# Patient Record
Sex: Male | Born: 1960 | Race: White | Hispanic: No | Marital: Married | Smoking: Former smoker
Health system: Southern US, Community
[De-identification: ages and names within clinical notes are randomized; demographics above are authoritative.]

## PROBLEM LIST (undated history)

## (undated) DIAGNOSIS — S59909A Unspecified injury of unspecified elbow, initial encounter: Secondary | ICD-10-CM

## (undated) DIAGNOSIS — F32A Depression, unspecified: Secondary | ICD-10-CM

## (undated) DIAGNOSIS — K760 Fatty (change of) liver, not elsewhere classified: Secondary | ICD-10-CM

## (undated) DIAGNOSIS — K219 Gastro-esophageal reflux disease without esophagitis: Secondary | ICD-10-CM

## (undated) DIAGNOSIS — I10 Essential (primary) hypertension: Secondary | ICD-10-CM

## (undated) DIAGNOSIS — I2510 Atherosclerotic heart disease of native coronary artery without angina pectoris: Secondary | ICD-10-CM

## (undated) DIAGNOSIS — J449 Chronic obstructive pulmonary disease, unspecified: Secondary | ICD-10-CM

## (undated) DIAGNOSIS — M92521 Juvenile osteochondrosis of tibia tubercle, right leg: Secondary | ICD-10-CM

## (undated) DIAGNOSIS — N6009 Solitary cyst of unspecified breast: Secondary | ICD-10-CM

## (undated) DIAGNOSIS — F329 Major depressive disorder, single episode, unspecified: Secondary | ICD-10-CM

## (undated) DIAGNOSIS — K7460 Unspecified cirrhosis of liver: Secondary | ICD-10-CM

## (undated) DIAGNOSIS — I209 Angina pectoris, unspecified: Secondary | ICD-10-CM

## (undated) DIAGNOSIS — E669 Obesity, unspecified: Secondary | ICD-10-CM

## (undated) DIAGNOSIS — E782 Mixed hyperlipidemia: Secondary | ICD-10-CM

## (undated) DIAGNOSIS — M9251 Juvenile osteochondrosis of tibia and fibula, right leg: Secondary | ICD-10-CM

## (undated) DIAGNOSIS — F419 Anxiety disorder, unspecified: Secondary | ICD-10-CM

## (undated) DIAGNOSIS — R0600 Dyspnea, unspecified: Secondary | ICD-10-CM

## (undated) DIAGNOSIS — E1140 Type 2 diabetes mellitus with diabetic neuropathy, unspecified: Secondary | ICD-10-CM

## (undated) DIAGNOSIS — M7711 Lateral epicondylitis, right elbow: Secondary | ICD-10-CM

## (undated) DIAGNOSIS — E119 Type 2 diabetes mellitus without complications: Secondary | ICD-10-CM

## (undated) HISTORY — DX: Fatty (change of) liver, not elsewhere classified: K76.0

## (undated) HISTORY — PX: TONSILLECTOMY: SUR1361

## (undated) HISTORY — DX: Type 2 diabetes mellitus with diabetic neuropathy, unspecified: E11.40

## (undated) HISTORY — DX: Atherosclerotic heart disease of native coronary artery without angina pectoris: I25.10

## (undated) HISTORY — DX: Mixed hyperlipidemia: E78.2

## (undated) HISTORY — DX: Type 2 diabetes mellitus without complications: E11.9

## (undated) HISTORY — PX: OTHER SURGICAL HISTORY: SHX169

## (undated) HISTORY — DX: Essential (primary) hypertension: I10

---
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
## 1997-09-17 HISTORY — PX: KNEE ARTHROPLASTY: SHX992

## 2001-05-15 ENCOUNTER — Ambulatory Visit (HOSPITAL_COMMUNITY): Admission: RE | Admit: 2001-05-15 | Discharge: 2001-05-15 | Payer: Self-pay | Admitting: Internal Medicine

## 2001-05-15 ENCOUNTER — Encounter: Payer: Self-pay | Admitting: Internal Medicine

## 2001-06-12 ENCOUNTER — Ambulatory Visit (HOSPITAL_COMMUNITY): Admission: RE | Admit: 2001-06-12 | Discharge: 2001-06-12 | Payer: Self-pay | Admitting: Internal Medicine

## 2001-06-12 ENCOUNTER — Encounter (INDEPENDENT_AMBULATORY_CARE_PROVIDER_SITE_OTHER): Payer: Self-pay | Admitting: Internal Medicine

## 2001-07-24 ENCOUNTER — Encounter: Admission: RE | Admit: 2001-07-24 | Discharge: 2001-10-22 | Payer: Self-pay | Admitting: Internal Medicine

## 2001-07-25 ENCOUNTER — Ambulatory Visit (HOSPITAL_COMMUNITY): Admission: RE | Admit: 2001-07-25 | Discharge: 2001-07-25 | Payer: Self-pay | Admitting: Internal Medicine

## 2001-07-25 ENCOUNTER — Encounter: Payer: Self-pay | Admitting: Internal Medicine

## 2001-09-17 HISTORY — PX: CHOLECYSTECTOMY: SHX55

## 2002-01-02 ENCOUNTER — Encounter: Payer: Self-pay | Admitting: Internal Medicine

## 2002-01-02 ENCOUNTER — Ambulatory Visit (HOSPITAL_COMMUNITY): Admission: RE | Admit: 2002-01-02 | Discharge: 2002-01-02 | Payer: Self-pay | Admitting: Internal Medicine

## 2002-02-02 ENCOUNTER — Encounter (INDEPENDENT_AMBULATORY_CARE_PROVIDER_SITE_OTHER): Payer: Self-pay | Admitting: Internal Medicine

## 2002-02-02 ENCOUNTER — Ambulatory Visit (HOSPITAL_COMMUNITY): Admission: RE | Admit: 2002-02-02 | Discharge: 2002-02-02 | Payer: Self-pay | Admitting: Internal Medicine

## 2002-02-06 ENCOUNTER — Ambulatory Visit (HOSPITAL_COMMUNITY): Admission: RE | Admit: 2002-02-06 | Discharge: 2002-02-06 | Payer: Self-pay | Admitting: Internal Medicine

## 2002-02-06 ENCOUNTER — Encounter (INDEPENDENT_AMBULATORY_CARE_PROVIDER_SITE_OTHER): Payer: Self-pay | Admitting: Internal Medicine

## 2003-09-29 ENCOUNTER — Ambulatory Visit (HOSPITAL_COMMUNITY): Admission: RE | Admit: 2003-09-29 | Discharge: 2003-09-29 | Payer: Self-pay | Admitting: Family Medicine

## 2003-11-09 ENCOUNTER — Ambulatory Visit (HOSPITAL_COMMUNITY): Admission: RE | Admit: 2003-11-09 | Discharge: 2003-11-09 | Payer: Self-pay | Admitting: Urology

## 2004-07-20 ENCOUNTER — Ambulatory Visit (HOSPITAL_COMMUNITY): Admission: RE | Admit: 2004-07-20 | Discharge: 2004-07-20 | Payer: Self-pay | Admitting: Family Medicine

## 2004-09-17 DIAGNOSIS — E782 Mixed hyperlipidemia: Secondary | ICD-10-CM

## 2004-09-17 HISTORY — DX: Mixed hyperlipidemia: E78.2

## 2004-10-19 ENCOUNTER — Ambulatory Visit: Payer: Self-pay | Admitting: Internal Medicine

## 2005-03-18 ENCOUNTER — Emergency Department (HOSPITAL_COMMUNITY): Admission: EM | Admit: 2005-03-18 | Discharge: 2005-03-18 | Payer: Self-pay | Admitting: *Deleted

## 2005-07-10 ENCOUNTER — Ambulatory Visit: Payer: Self-pay | Admitting: Internal Medicine

## 2005-12-03 ENCOUNTER — Ambulatory Visit (HOSPITAL_COMMUNITY): Admission: RE | Admit: 2005-12-03 | Discharge: 2005-12-03 | Payer: Self-pay | Admitting: Family Medicine

## 2006-02-09 ENCOUNTER — Emergency Department (HOSPITAL_COMMUNITY): Admission: EM | Admit: 2006-02-09 | Discharge: 2006-02-09 | Payer: Self-pay | Admitting: Emergency Medicine

## 2006-09-25 ENCOUNTER — Ambulatory Visit: Payer: Self-pay | Admitting: Internal Medicine

## 2009-09-17 HISTORY — PX: CARDIAC CATHETERIZATION: SHX172

## 2009-11-21 ENCOUNTER — Encounter (INDEPENDENT_AMBULATORY_CARE_PROVIDER_SITE_OTHER): Payer: Self-pay | Admitting: *Deleted

## 2009-11-21 LAB — CONVERTED CEMR LAB
ALT: 56 units/L
AST: 39 units/L
Albumin: 4.5 g/dL
Alkaline Phosphatase: 37 units/L
BUN: 14 mg/dL
Bilirubin, Direct: 0.1 mg/dL
CO2: 25 meq/L
Calcium: 9.7 mg/dL
Chloride: 101 meq/L
Cholesterol: 193 mg/dL
Creatinine, Ser: 0.98 mg/dL
Glucose, Bld: 186 mg/dL
HDL: 31 mg/dL
LDL Cholesterol: 86 mg/dL
Potassium: 4.1 meq/L
Sodium: 140 meq/L
Total Protein: 6.8 g/dL
Triglycerides: 380 mg/dL

## 2009-11-22 ENCOUNTER — Ambulatory Visit (HOSPITAL_COMMUNITY): Admission: RE | Admit: 2009-11-22 | Discharge: 2009-11-22 | Payer: Self-pay | Admitting: Family Medicine

## 2009-11-24 ENCOUNTER — Encounter (INDEPENDENT_AMBULATORY_CARE_PROVIDER_SITE_OTHER): Payer: Self-pay | Admitting: *Deleted

## 2009-11-24 DIAGNOSIS — Z9189 Other specified personal risk factors, not elsewhere classified: Secondary | ICD-10-CM | POA: Insufficient documentation

## 2009-11-24 DIAGNOSIS — E119 Type 2 diabetes mellitus without complications: Secondary | ICD-10-CM | POA: Insufficient documentation

## 2009-11-25 ENCOUNTER — Encounter (INDEPENDENT_AMBULATORY_CARE_PROVIDER_SITE_OTHER): Payer: Self-pay | Admitting: *Deleted

## 2009-11-25 ENCOUNTER — Ambulatory Visit (HOSPITAL_COMMUNITY): Admission: RE | Admit: 2009-11-25 | Discharge: 2009-11-25 | Payer: Self-pay | Admitting: Cardiology

## 2009-11-25 ENCOUNTER — Ambulatory Visit: Payer: Self-pay | Admitting: Cardiology

## 2009-11-25 ENCOUNTER — Encounter: Payer: Self-pay | Admitting: Adult Health

## 2009-11-25 DIAGNOSIS — R9439 Abnormal result of other cardiovascular function study: Secondary | ICD-10-CM | POA: Insufficient documentation

## 2009-11-25 DIAGNOSIS — I200 Unstable angina: Secondary | ICD-10-CM | POA: Insufficient documentation

## 2009-11-25 DIAGNOSIS — E785 Hyperlipidemia, unspecified: Secondary | ICD-10-CM | POA: Insufficient documentation

## 2009-11-29 ENCOUNTER — Encounter: Payer: Self-pay | Admitting: Cardiology

## 2009-11-29 ENCOUNTER — Encounter (INDEPENDENT_AMBULATORY_CARE_PROVIDER_SITE_OTHER): Payer: Self-pay

## 2009-11-29 ENCOUNTER — Ambulatory Visit: Payer: Self-pay | Admitting: Cardiology

## 2009-11-29 ENCOUNTER — Inpatient Hospital Stay (HOSPITAL_BASED_OUTPATIENT_CLINIC_OR_DEPARTMENT_OTHER): Admission: RE | Admit: 2009-11-29 | Discharge: 2009-11-29 | Payer: Self-pay | Admitting: Cardiology

## 2009-12-09 ENCOUNTER — Telehealth (INDEPENDENT_AMBULATORY_CARE_PROVIDER_SITE_OTHER): Payer: Self-pay

## 2009-12-23 ENCOUNTER — Ambulatory Visit (HOSPITAL_COMMUNITY): Admission: RE | Admit: 2009-12-23 | Discharge: 2009-12-23 | Payer: Self-pay | Admitting: Cardiology

## 2009-12-23 ENCOUNTER — Ambulatory Visit: Payer: Self-pay | Admitting: Cardiovascular Disease

## 2009-12-23 ENCOUNTER — Encounter: Payer: Self-pay | Admitting: Cardiology

## 2009-12-26 ENCOUNTER — Encounter (INDEPENDENT_AMBULATORY_CARE_PROVIDER_SITE_OTHER): Payer: Self-pay | Admitting: *Deleted

## 2009-12-28 ENCOUNTER — Encounter: Payer: Self-pay | Admitting: Adult Health

## 2009-12-28 ENCOUNTER — Ambulatory Visit: Payer: Self-pay | Admitting: Cardiology

## 2009-12-28 DIAGNOSIS — Z9861 Coronary angioplasty status: Secondary | ICD-10-CM

## 2009-12-28 DIAGNOSIS — I10 Essential (primary) hypertension: Secondary | ICD-10-CM | POA: Insufficient documentation

## 2009-12-28 DIAGNOSIS — I2510 Atherosclerotic heart disease of native coronary artery without angina pectoris: Secondary | ICD-10-CM | POA: Insufficient documentation

## 2010-01-16 ENCOUNTER — Ambulatory Visit (HOSPITAL_COMMUNITY)
Admission: RE | Admit: 2010-01-16 | Discharge: 2010-01-16 | Payer: Self-pay | Source: Home / Self Care | Admitting: Podiatry

## 2010-05-10 ENCOUNTER — Encounter: Payer: Self-pay | Admitting: Adult Health

## 2010-09-13 ENCOUNTER — Ambulatory Visit (HOSPITAL_COMMUNITY)
Admission: RE | Admit: 2010-09-13 | Discharge: 2010-09-13 | Payer: Self-pay | Source: Home / Self Care | Attending: Family Medicine | Admitting: Family Medicine

## 2010-10-15 LAB — CONVERTED CEMR LAB
BUN: 12 mg/dL (ref 6–23)
Basophils Absolute: 0 10*3/uL (ref 0.0–0.1)
Basophils Relative: 1 % (ref 0–1)
CO2: 20 meq/L (ref 19–32)
Calcium: 10.5 mg/dL (ref 8.4–10.5)
Chloride: 101 meq/L (ref 96–112)
Creatinine, Ser: 0.93 mg/dL (ref 0.40–1.50)
Eosinophils Absolute: 0.2 10*3/uL (ref 0.0–0.7)
Eosinophils Relative: 2 % (ref 0–5)
Glucose, Bld: 131 mg/dL — ABNORMAL HIGH (ref 70–99)
HCT: 45.5 % (ref 39.0–52.0)
Hemoglobin: 16 g/dL (ref 13.0–17.0)
INR: 1.14 (ref <1.50)
Lymphocytes Relative: 35 % (ref 12–46)
Lymphs Abs: 2.7 10*3/uL (ref 0.7–4.0)
MCHC: 35.2 g/dL (ref 30.0–36.0)
MCV: 86 fL (ref 78.0–100.0)
Monocytes Absolute: 0.6 10*3/uL (ref 0.1–1.0)
Monocytes Relative: 8 % (ref 3–12)
Neutro Abs: 4.2 10*3/uL (ref 1.7–7.7)
Neutrophils Relative %: 55 % (ref 43–77)
Platelets: 174 10*3/uL (ref 150–400)
Potassium: 4.1 meq/L (ref 3.5–5.3)
Prothrombin Time: 14.5 s (ref 11.6–15.2)
RBC: 5.29 M/uL (ref 4.22–5.81)
RDW: 13.4 % (ref 11.5–15.5)
Sodium: 138 meq/L (ref 135–145)
WBC: 7.7 10*3/uL (ref 4.0–10.5)
aPTT: 27 s (ref 24–37)

## 2010-10-17 NOTE — Letter (Signed)
Summary: Stress Echocardiogram Information Sheet  [facility] at [place]. [address] [place] [address]   Phone: [phone]  Fax: [phone]      November 29, 2009 MRN: [id number] light prior to the test.   Jared Griffin  Doctor: Appointment Date: Appointment Time: Appointment Location: [facility]  Stress Echocardiogram Information Sheet    Instructions:   1. DO NOT  take your Glucophage (Metformin) the night before or the morning of Stress Echocardiogram.  2. Do Not eat or drink after midnight the night before Stress Echocardiogram.  3. Dress prepared to exercise.  4. DO NOT use ANY caffine or tobacco products 3 hours before appointment.  5. Report to the [facility] on the1st floor.  6. Please bring all current prescription medications.  7. If you have any questions, please call [phone]

## 2010-10-17 NOTE — Miscellaneous (Signed)
Summary: labs bmp,lipids,liver,11/21/2009  Clinical Lists Changes  Observations: Added new observation of CALCIUM: 9.7 mg/dL (11/21/2009 15:27) Added new observation of ALBUMIN: 4.5 g/dL (11/21/2009 15:27) Added new observation of PROTEIN, TOT: 6.8 g/dL (11/21/2009 15:27) Added new observation of SGPT (ALT): 56 units/L (11/21/2009 15:27) Added new observation of SGOT (AST): 39 units/L (11/21/2009 15:27) Added new observation of ALK PHOS: 37 units/L (11/21/2009 15:27) Added new observation of BILI DIRECT: 0.1 mg/dL (11/21/2009 15:27) Added new observation of CREATININE: 0.98 mg/dL (11/21/2009 15:27) Added new observation of BUN: 14 mg/dL (11/21/2009 15:27) Added new observation of BG RANDOM: 186 mg/dL (11/21/2009 15:27) Added new observation of CO2 PLSM/SER: 25 meq/L (11/21/2009 15:27) Added new observation of CL SERUM: 101 meq/L (11/21/2009 15:27) Added new observation of K SERUM: 4.1 meq/L (11/21/2009 15:27) Added new observation of NA: 140 meq/L (11/21/2009 15:27) Added new observation of LDL: 86 mg/dL (11/21/2009 15:27) Added new observation of HDL: 31 mg/dL (11/21/2009 15:27) Added new observation of TRIGLYC TOT: 380 mg/dL (11/21/2009 15:27) Added new observation of CHOLESTEROL: 193 mg/dL (11/21/2009 15:27)

## 2010-10-17 NOTE — Miscellaneous (Signed)
**Note De-Identified  Obfuscation** Summary: ORDER: STRESS ECHO  Clinical Lists Changes  Orders: Added new Referral order of Stress Echo (Stress Echo) - Signed

## 2010-10-17 NOTE — Letter (Signed)
Summary: ekg  ekg   Imported By: Nevada Crane 11/25/2009 16:38:34  _____________________________________________________________________  External Attachment:    Type:   Image     Comment:   External Document

## 2010-10-17 NOTE — Progress Notes (Signed)
Summary: high BP  Phone Note Call from Patient Call back at Home Phone [phone]   Caller: pt Reason for Call: Talk to Nurse Summary of Call: per pt walk in his Bp has been running 133/100 yesterday and 144/97 today. Initial call taken by: Nevada Crane,  December 09, 2009 9:48 AM  Follow-up for Phone Call        [facility]. Follow-up by: Jeani Hawking Via LPN,  December 10, 6071 11:10 AM  Additional Follow-up for Phone Call Additional follow up Details #1::        the pt does not take anything for his bp,, he has ocas. cp, no edema or palp  Additional Follow-up by: Tye Savoy RN,  December 09, 2009 11:41 AM    Additional Follow-up for Phone Call Additional follow up Details #2::    Add HCTZ 12.71m daily.  Additional Follow-up for Phone Call Additional follow up Details #3:: Details for Additional Follow-up Action Taken: Pt. advised and states he understands info. given. RX sent to [facility]  Additional Follow-up by: LJeani HawkingVia LPN,  March 25, 271061:26 PM  New/Updated Medications: HYDROCHLOROTHIAZIDE 12.5 MG TABS (HYDROCHLOROTHIAZIDE) Take one tablet by mouth daily. Prescriptions: HYDROCHLOROTHIAZIDE 12.5 MG TABS (HYDROCHLOROTHIAZIDE) Take one tablet by mouth daily.  #30 x 6   Entered by:   LJeani HawkingVia LPN   Authorized by:   KJory Sims NP   Signed by:   LJeani HawkingVia LPN on 026/94/8546  Method used:   Electronically to        [facility](retail)       [address]      [place]   [id number]      Ph: [phone]      Fax: [phone]  RxID:   [id number]

## 2010-10-17 NOTE — Letter (Signed)
Summary: labs  labs   Imported By: Nevada Crane 11/25/2009 16:38:12  _____________________________________________________________________  External Attachment:    Type:   Image     Comment:   External Document

## 2010-10-17 NOTE — Letter (Signed)
Summary: [facility] family progress note 04-27-10  [place] [facility] family progress note 04-27-10   Imported By: Nevada Crane 05/10/2010 11:02:12  _____________________________________________________________________  External Attachment:    Type:   Image     Comment:   External Document

## 2010-10-17 NOTE — Letter (Signed)
Summary: Cardiac Catheterization Instructions- [facility]  [facility] [place]. [address], [place] [address]   Phone: [phone]  Fax: [phone]     11/25/2009 MRN: [id number]  Jared Griffin Jared Griffin, Jared Griffin  [id number]  Dear [name],   Dennis Bast are scheduled for a Cardiac Catheterization on 11/29/2009 with Dr.sTUCKEY______________  Please arrive to the 1st floor of the Heart and Vascular Center at [facility] at 10:30 am on the day of your procedure. Please do not arrive before 6:30 a.m. Call the Heart and Vascular Center at [phone] if you are unable to make your appointmnet. The Code to get into the parking garage under the building is 9000. Take the elevators to the 1st floor. You must have someone to drive you home. Someone must be with you for the first 24 hours after you arrive home. Please wear clothes that are easy to get on and off and wear slip-on shoes. Do not eat or drink after midnight except water with your medications that morning. Bring all your medications and current insurance cards with you.  ___ DO NOT take these medications before your procedure: ________________________________________________________________  _X__ Make sure you take your aspirin.  _X__ You may take ALL of your medications with water that morning. ________________________________________________________________________________________________________________________________  ___ DO NOT take ANY medications before your procedure.  ___ Pre-med instructions:  ________________________________________________________________________________________________________________________________  The usual length of stay after your procedure is 2 to 3 hours. This can vary.  If you have any questions, please call the office at the number listed above.   Tye Savoy RN

## 2010-10-17 NOTE — Miscellaneous (Signed)
Summary: Stress echo scheduled  Pt. notified of stress echocardiogram date and time of 12-07-09 @ [facility]. @  10:30. Also, I went over insturctions with pt's wife and mailed instruction sheet to pt.  Clinical Lists Changes

## 2010-10-17 NOTE — Assessment & Plan Note (Signed)
Summary: ***per Dr.Scott Luking for abnormal stress test and chest pai...   Visit Type:  Initial Consult Primary Provider:  Einar Pheasant  CC:  abnormal stress test.  History of Present Illness: Mr.Jared Griffin is a 50 y/o CM with no prior history of CAD, but recent history of HTN, Diabetes diagnosed 6 yrs ago, hypertriglyceridemai who has been complaining of midsternal chest discomfort with and without exertion.  He has also noticed that his blood pressure is elevated more lately and he is more fatigued that usual.  He cannot play withi his son for more than 10 minutes without having to stop.  No SOB, Dizziness or Diaphoresis associated.  He reported these symptoms to his primary care physician, Dr. Wolfgang Phoenix who scheduled a GXT.  This was found to be abnormal with inferior and lateral ST depression indicative for ischemia. He was therefore referred to our office for further evaluation.  The patient is without complaint today, but continues to have fatigue that is unexplained.  He is quite concerned about the stress test results. His wife is in attendance.  Preventive Screening-Counseling & Management  Alcohol-Tobacco     Alcohol drinks/day: 0     Smoking Status: quit > 6 months     Pack years: 2  Current Medications (verified): 1)  Glucophage 500 Mg Tabs (Metformin Hcl) .... Take 2  Tab Two Times A Day 2)  Allegra-D 24 Hour 180-240 Mg Xr24h-Tab (Fexofenadine-Pseudoephedrine) .... Take 1 Tab Prn 3)  Lopid 600 Mg Tabs (Gemfibrozil) .... Take 1 Tab Two Times A Day 4)  Aspir-Low 81 Mg Tbec (Aspirin) .... Take 1 Tab Daily  Allergies (verified): No Known Drug Allergies  Past History:  Past Medical History: Last updated: 11/24/2009 Current Problems:  DM (ICD-250.00) ABDOMINAL PAIN, HX OF (ICD-V15.89)  Family History: He is adopted and does not know family history.  Social History: Alcohol drinks/day:  0 Smoking Status:  quit > 6 months Pack years:  2  Review of Systems   Increased fatigue  Vital Signs:  Patient profile:   50 year old male Height:      74 inches Weight:      255 pounds BMI:     32.86 Pulse rate:   101 / minute BP sitting:   132 / 91  (right arm)  Vitals Entered By: Doretha Sou, CNA (November 25, 2009 10:56 AM)  Physical Exam  General:  Well developed, well nourished, in no acute distress. Head:  normocephalic and atraumatic Eyes:  PERRLA/EOM intact; conjunctiva and lids normal. Ears:  TM's intact and clear with normal canals and hearing Nose:  no deformity, discharge, inflammation, or lesions Mouth:  Teeth, gums and palate normal. Oral mucosa normal. Neck:  Neck supple, no JVD. No masses, thyromegaly or abnormal cervical nodes. Lungs:  Clear bilaterally to auscultation and percussion. Heart:  Non-displaced PMI, chest non-tender; regular rate and rhythm, S1, S2 without murmurs, rubs or gallops. Carotid upstroke normal, no bruit. Normal abdominal aortic size, no bruits. Femorals normal pulses, no bruits. Pedals normal pulses. No edema, no varicosities. Abdomen:  Bowel sounds positive; abdomen soft and non-tender without masses, organomegaly, or hernias noted. No hepatosplenomegaly. Msk:  Back normal, normal gait. Muscle strength and tone normal. Pulses:  pulses normal in all 4 extremities Extremities:  No clubbing or cyanosis. Neurologic:  Alert and oriented x 3. Skin:  Intact without lesions or rashes. Psych:  Normal affect.   EKG  Procedure date:  11/25/2009  Findings:      Normal sinus rhythm  with rate of:  97bpm  Impression & Recommendations:  Problem # 1:  CHEST PAIN UNSPECIFIED (ICD-786.50)  His updated medication list for this problem includes:    Aspir-low 81 Mg Tbec (Aspirin) .Marland Kitchen... Take 1 tab daily  Orders: Cardiac Catheterization (Cardiac Cath)  His updated medication list for this problem includes:    Aspir-low 81 Mg Tbec (Aspirin) .Marland Kitchen... Take 1 tab daily  Problem # 2:  DM (ICD-250.00)  As above. The  following medications were removed from the medication list:    Glipizide Xl 2.5 Mg Xr24h-tab (Glipizide) .Marland Kitchen... Take 1 tab daily His updated medication list for this problem includes:    Glucophage 500 Mg Tabs (Metformin hcl) .Marland Kitchen... Take 2  tab two times a day    Aspir-low 81 Mg Tbec (Aspirin) .Marland Kitchen... Take 1 tab daily  The following medications were removed from the medication list:    Glipizide Xl 2.5 Mg Xr24h-tab (Glipizide) .Marland Kitchen... Take 1 tab daily His updated medication list for this problem includes:    Glucophage 500 Mg Tabs (Metformin hcl) .Marland Kitchen... Take 2  tab two times a day    Aspir-low 81 Mg Tbec (Aspirin) .Marland Kitchen... Take 1 tab daily  Problem # 3:  HYPERLIPIDEMIA-MIXED (ICD-272.4)  He will continue on lopid as directed. The following medications were removed from the medication list:    Tricor 145 Mg Tabs (Fenofibrate) .Marland Kitchen... Take 1 tab daily His updated medication list for this problem includes:    Lopid 600 Mg Tabs (Gemfibrozil) .Marland Kitchen... Take 1 tab two times a day  The following medications were removed from the medication list:    Tricor 145 Mg Tabs (Fenofibrate) .Marland Kitchen... Take 1 tab daily His updated medication list for this problem includes:    Lopid 600 Mg Tabs (Gemfibrozil) .Marland Kitchen... Take 1 tab two times a day  Other Orders: T-Chest x-ray, 2 views (96283) T-Basic Metabolic Panel ([id number]) T-CBC w/Diff ([id number]) T-Protime, Auto ([id number]) T-PTT ([id number])  Patient Instructions: 1)  Your physician recommends that you schedule a follow-up appointment in: after cath 2)  Your physician has requested that you have a cardiac catheterization.  Cardiac catheterization is used to diagnose and/or treat various heart conditions. Doctors may recommend this procedure for a number of different reasons. The most common reason is to evaluate chest pain. Chest pain can be a symptom of coronary artery disease (CAD), and cardiac catheterization can show whether plaque is narrowing or blocking  your heart's arteries. This procedure is also used to evaluate the valves, as well as measure the blood flow and oxygen levels in different parts of your heart.  For further information please visit [email or web address].  Please follow instruction sheet, as given.

## 2010-10-17 NOTE — Assessment & Plan Note (Signed)
Summary: EPH   Visit Type:  Follow-up Primary Provider:  Einar Pheasant  CC:  Follow-Up.  History of Present Illness: Mr. Carriger is a pleasant 50 y/o CM with known history of DM, hyperlipidemia, who  are following s/p cardiac catherization secondary to abnormal stress test per Dr. Wolfgang Phoenix.  Cardiac cath was completed by Dr. Lia Foyer on 11/29/2009 revealing a 40% narrowing in the LAD, calcified stenosis is the prox LAD and well preserved LV fx.  It was recommended that the patient have a follow-up perfusion study to assess for ischemia and wall motion abnormalites.  This was completed on 12/23/2009 revealing no stress induced arrythmias or ischemia.   Since being discharged, the patient has been walking 3 times a week in an effort to lose weight.  He was also started on HCTZ 12.44m daily for mildly elevated BP.  He states he did not take it because his blood pressure is normal at home and only elevated at work.  He also takes Allegra D for sinus allergies.  He continues mild DOE and is not sleeping well at night.  Otherwise he is without complaints.  Current Medications (verified): 1)  Glucophage 500 Mg Tabs (Metformin Hcl) .... Take 2  Tab Two Times A Day 2)  Allegra-D 24 Hour 180-240 Mg Xr24h-Tab (Fexofenadine-Pseudoephedrine) .... Take 1 Tab Prn 3)  Lopid 600 Mg Tabs (Gemfibrozil) .... Take 1 Tab Two Times A Day 4)  Aspir-Low 81 Mg Tbec (Aspirin) .... Take 2 Tabs Daily 5)  Hydrochlorothiazide 12.5 Mg Tabs (Hydrochlorothiazide) .... Take One Tablet By Mouth Daily.  Allergies (verified): No Known Drug Allergies  Past History:  Past medical, surgical, family and social histories (including risk factors) reviewed, and no changes noted (except as noted below).  Past Medical History: Reviewed history from 11/24/2009 and no changes required. Current Problems:  DM (ICD-250.00) ABDOMINAL PAIN, HX OF (ICD-V15.89)  Past Surgical History: Reviewed history from 11/24/2009 and no changes  required. cholecystectomy liver biopsy  Family History: Reviewed history from 11/25/2009 and no changes required. He is adopted and does not know family history.  Social History: Reviewed history from 11/24/2009 and no changes required. Full Time([employer], structural Tobacco Use - No.  Alcohol Use - no  Review of Systems       All other systems have been reviewed and are negative unless stated above.   Vital Signs:  Patient profile:   50year old male Weight:      254 pounds Pulse rate:   107 / minute BP sitting:   128 / 95  (right arm)  Vitals Entered By: SDoretha Sou CNA (December 28, 2009 11:31 AM)  Physical Exam  General:  Well developed, well nourished, in no acute distress. Lungs:  Clear bilaterally to auscultation and percussion. Heart:  Tachycardic   EKG  Procedure date:  12/28/2009  Findings:      Sinus tachycardia with rate of:    Impression & Recommendations:  Problem # 1:  CAD, NATIVE VESSEL (ICD-414.01) His cardiac cath revealed a 40% mid LAD narrowing with calcified stenosis in the mid vessel.  Follow-up perfusion/echo revealed no stress induced ischemia.  He remains committed to lose weight and is exercising 3 times a week.  I have encouraged him to continue.   His updated medication list for this problem includes:    Aspir-low 81 Mg Tbec (Aspirin) ..Marland Kitchen.. Take 2 tabs daily  Problem # 2:  ESSENTIAL HYPERTENSION, BENIGN (ICD-401.1) He was recently started on HCTZ 12.5 mg daily.  He has not taken it because he states is BP is up only when he is "at work."  I have advised him to take the medication daily as HTN at work is hypertension.  With his DM, we can consider adding an ACE- if his blood pressure is elevated consistantly.  I have advised him of the need to keep his BP under control with CAD and DM.  He is also taking Allegra D which is presecribed by his primary care physician.  I have advised him not to take this.  Ok to take [employer] the  psuedophedrine component.  He may take corcidin HBP or mucinex or congestion as needed.  edication list for this problem includes:    Aspir-low 81 Mg Tbec (Aspirin) .Marland Kitchen... Take 2 tabs daily    Hydrochlorothiazide 12.5 Mg Tabs (Hydrochlorothiazide) .Marland Kitchen... Take one tablet by mouth daily.  Problem # 3:  CHEST TIGHTNESS-PRESSURE-OTHER ([id number]) Assessment: Improved  Patient Instructions: 1)  Your physician recommends that you schedule a follow-up appointment in: 3 months 2)  Your physician has recommended you make the following change in your medication: Start taking Hydrochlorothiazide 12.78m by mouth once daily and Corcidine HBP for allergy relief.

## 2010-10-17 NOTE — Letter (Signed)
Summary: progress note  progress note   Imported By: Nevada Crane 11/25/2009 16:37:35  _____________________________________________________________________  External Attachment:    Type:   Image     Comment:   External Document

## 2010-10-17 NOTE — Letter (Signed)
Summary: [facility], general practice at [place]. [address] [place] [address]   Phone: [phone]  Fax: [phone]      December 26, 2009 MRN: [id number]   [address] [place], [place]  [address]   Dear Mr. NOGUEZ,  Your test ordered by Rande Lawman has been reviewed by your physician (or physician assistant) and was found to be normal or stable. Your physician (or physician assistant) felt no changes were needed at this time.  __x__ Echocardiogram  ____ Cardiac Stress Test  __x__ Lab Work  ____ Peripheral vascular study of arms, legs or neck  ____ CT scan or X-ray  ____ Lung or Breathing test  ____ Other:  No change in medical treatment at this time, per Dr. Verl Blalock.  Echocardiogram is very reassuring and no further cardiac workup is needed.  Thank you, Bart Ashford [facility] RN    Jacqulyn Ducking, MD, Leana Gamer.C.Renella Cunas, MD, F.A.C.C Cristopher Peru, MD, F.A.C.C Rozann Lesches, MD, F.A.C.C Jenkins Rouge, MD, Leana Gamer.C.C

## 2010-10-23 ENCOUNTER — Other Ambulatory Visit (HOSPITAL_COMMUNITY): Payer: Self-pay | Admitting: Family Medicine

## 2010-10-23 DIAGNOSIS — M542 Cervicalgia: Secondary | ICD-10-CM

## 2010-10-25 ENCOUNTER — Ambulatory Visit (HOSPITAL_COMMUNITY)
Admission: RE | Admit: 2010-10-25 | Discharge: 2010-10-25 | Disposition: A | Discharge status: Home / Self Care | Payer: PRIVATE HEALTH INSURANCE | Source: Ambulatory Visit | Attending: Family Medicine | Admitting: Family Medicine

## 2010-10-25 DIAGNOSIS — M5020 Other cervical disc displacement, unspecified cervical region: Secondary | ICD-10-CM | POA: Insufficient documentation

## 2010-10-25 DIAGNOSIS — M5030 Other cervical disc degeneration, unspecified cervical region: Secondary | ICD-10-CM | POA: Insufficient documentation

## 2010-10-25 DIAGNOSIS — M542 Cervicalgia: Secondary | ICD-10-CM | POA: Insufficient documentation

## 2010-12-11 LAB — POCT I-STAT GLUCOSE
Glucose, Bld: 162 mg/dL — ABNORMAL HIGH (ref 70–99)
Operator id: 221371

## 2011-02-02 NOTE — H&P (Signed)
[facility]  Patient:    Jared Griffin, Jared Griffin Visit Number: [id number] MRN: [id number]          Service Type: OUT Location: RAD Attending Physician:  Bridgette Habermann Dictated by:   Garfield Cornea, M.D. Admit Date:  01/02/2002   CC:         Redmond School, M.D., [facility]  Benson Norway, M.D., [facility], [place], [place]   History and Physical  CHIEF COMPLAINT:  Right upper quadrant abdominal pain following laparoscopic cholecystectomy.  HISTORY OF PRESENT ILLNESS:  This patient is a pleasant, 50 year old male [name] who called me today concerned about right upper quadrant abdominal pain radiating to his back which he has been having for the past 3 days.  He underwent a laparoscopic cholecystectomy by Dr. Benson Norway over at [facility] on 12/15/01.  He was having some colic-like symptoms and describes being found to have sludge.  He also has a history of elevated transaminases and underwent a liver biopsy for which she saw Dr. Laural Golden and followed up recently.  He was told that he had changes consistent with fatty liver.  It is notable that for several days following cholecystectomy he did very well, but started having pain 2 days ago.  He says that it is worse when he bends over, when he turns from side-to-side.  He has not been able to sleep very well.  He has not had any fever or chills.  There has been no nausea or vomiting.  He is able to eat very well.  He denies any reflux symptoms, nausea, or vomiting.  No change in bowel habits.  No melena or rectal bleeding.  He has not been taking anything but some Percocet and has not taken any of those for several days, although he has several on hand at home.  He saw Dr. Lindalou Hose yesterday, he thought that everything was find related to his surgery (per patient report).  After a lengthy conversation with him, I recommended he come over to the hospital and go ahead and  get a HIDA scan just to make sure that he does not have a bile leak.  This was done earlier today.  I reviewed these films with Dr. Camila Li.  This appeared to be a very good quality study and demonstrated no evidence of biliary leak in addition blood work revealed a white count of 6.2, hemoglobin 16.4, hematocrit 45.9.  LFTs revealed an SGOT of 30, SGPT slightly elevated at 56, total bilirubin 0.9.  Amylase was 33.  PAST MEDICAL HISTORY:  Significant for type 2 diabetes mellitus, history of ankle surgery.  CURRENT MEDICATIONS:  Glucotrol, Percocet p.r.n.  ALLERGIES:  No known drug allergies.  FAMILY HISTORY:  Negative for chronic GI or liver disease.  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 He does not smoke or consume alcohol.  REVIEW OF SYSTEMS:  As in history of present illness.  PHYSICAL EXAMINATION:  GENERAL:  This gentleman was seen in the Nuclear Medicine Department at [facility].  He is accompanied by his wife.  He appears alert, conversant, and in no acute distress.  VITAL SIGNS:  The vital signs taken in the emergency department: temperature 97.3, BP 131/77, pulse 90, respiratory rate 16.  SKIN:  Warm and dry.  There is no jaundice.  HEENT:  No scleral icterus.  Conjunctivae are pink.  NECK:  JVD is not prominent.  LUNGS:  Clear to auscultation.  CARDIAC:  Regular rate and rhythm without murmur, gallop, or rub.  ABDOMEN:  Nondistended.  He has recent well healing laparoscopic sites. Positive bowel sounds, soft, with some epigastric right upper quadrant tenderness.  No appreciable mass or organomegaly.  There is no rebound tenderness, no CVA tenderness.  IMPRESSION:  This patient is a pleasant, 51 year old gentleman with some epigastric, right upper quadrant abdominal pain, some slightly more than 2 weeks out from his laparoscopic cholecystectomy (______ liver biopsy).  It is reassuring to see that he does not have  any scintigraphic evidence of a biliary leak.  Also his labs look good except for a slight bump in the SGPT which sounds like this finding is somewhat chronic by patient report.  He does have a history of fatty liver.  I do not feel that he has any complications related to laparoscopic cholecystectomy.  I suspect that he may have over exerted himself recently had he is simply feeling sensations related to recent operation.  He is supposed to go back to work this weekend.  RECOMMENDATIONS:  I have asked him to take it easy and rest and he can have all of next week off.  I have given him a work excuse for April 18, April 19, April 20.  I have asked him to go ahead and liberalize the use of his Percocet for the next couple of days taking 1 q.6h.  I am hopeful that his symptoms will subside.  If he is no better or has additional problems within the next couple of days he is urged to give me a call.  Otherwise he is to follow up with Dr. Gerarda Fraction routinely as needed. Dictated by:   Garfield Cornea, M.D. Attending Physician:  Bridgette Habermann DD:  01/02/02 TD:  01/02/02 Job: [id number]

## 2011-02-28 ENCOUNTER — Ambulatory Visit (HOSPITAL_COMMUNITY): Payer: PRIVATE HEALTH INSURANCE | Admitting: Physical Therapy

## 2011-03-05 ENCOUNTER — Ambulatory Visit (HOSPITAL_COMMUNITY): Payer: PRIVATE HEALTH INSURANCE | Admitting: Physical Therapy

## 2011-03-08 ENCOUNTER — Ambulatory Visit (HOSPITAL_COMMUNITY): Payer: PRIVATE HEALTH INSURANCE | Admitting: Physical Therapy

## 2011-03-26 ENCOUNTER — Encounter: Payer: Self-pay | Admitting: Cardiology

## 2012-04-29 ENCOUNTER — Encounter (INDEPENDENT_AMBULATORY_CARE_PROVIDER_SITE_OTHER): Payer: Self-pay | Admitting: *Deleted

## 2012-04-29 NOTE — Progress Notes (Signed)
This encounter was created in error - please disregard.

## 2012-04-30 ENCOUNTER — Other Ambulatory Visit (INDEPENDENT_AMBULATORY_CARE_PROVIDER_SITE_OTHER): Payer: Self-pay | Admitting: *Deleted

## 2012-04-30 DIAGNOSIS — Z1211 Encounter for screening for malignant neoplasm of colon: Secondary | ICD-10-CM

## 2012-05-05 ENCOUNTER — Encounter (INDEPENDENT_AMBULATORY_CARE_PROVIDER_SITE_OTHER): Payer: Self-pay | Admitting: *Deleted

## 2012-05-05 ENCOUNTER — Telehealth (INDEPENDENT_AMBULATORY_CARE_PROVIDER_SITE_OTHER): Payer: Self-pay | Admitting: *Deleted

## 2012-05-05 DIAGNOSIS — Z1211 Encounter for screening for malignant neoplasm of colon: Secondary | ICD-10-CM

## 2012-05-05 MED ORDER — PEG-KCL-NACL-NASULF-NA ASC-C 100 G PO SOLR: 1.0000 | Freq: Once | ORAL | Status: DC

## 2012-05-05 NOTE — Telephone Encounter (Signed)
Patient needs movi prep 

## 2012-05-21 ENCOUNTER — Ambulatory Visit (HOSPITAL_COMMUNITY)
Admission: RE | Admit: 2012-05-21 | Discharge: 2012-05-21 | Disposition: A | Discharge status: Home / Self Care | Payer: Worker's Compensation | Source: Ambulatory Visit | Attending: Preventative Medicine | Admitting: Preventative Medicine

## 2012-05-21 DIAGNOSIS — M545 Low back pain, unspecified: Secondary | ICD-10-CM | POA: Insufficient documentation

## 2012-05-21 DIAGNOSIS — E119 Type 2 diabetes mellitus without complications: Secondary | ICD-10-CM | POA: Insufficient documentation

## 2012-05-21 DIAGNOSIS — I10 Essential (primary) hypertension: Secondary | ICD-10-CM | POA: Insufficient documentation

## 2012-05-21 DIAGNOSIS — IMO0001 Reserved for inherently not codable concepts without codable children: Secondary | ICD-10-CM | POA: Insufficient documentation

## 2012-05-21 DIAGNOSIS — M6281 Muscle weakness (generalized): Secondary | ICD-10-CM | POA: Insufficient documentation
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
## 2012-05-22 ENCOUNTER — Ambulatory Visit (HOSPITAL_COMMUNITY)
Admission: RE | Admit: 2012-05-22 | Discharge: 2012-05-22 | Disposition: A | Discharge status: Home / Self Care | Payer: Worker's Compensation | Source: Ambulatory Visit | Attending: Family Medicine | Admitting: Family Medicine

## 2012-05-22 DIAGNOSIS — M6281 Muscle weakness (generalized): Secondary | ICD-10-CM | POA: Insufficient documentation

## 2012-05-22 DIAGNOSIS — E119 Type 2 diabetes mellitus without complications: Secondary | ICD-10-CM | POA: Insufficient documentation

## 2012-05-22 DIAGNOSIS — M545 Low back pain, unspecified: Secondary | ICD-10-CM | POA: Insufficient documentation

## 2012-05-22 DIAGNOSIS — IMO0001 Reserved for inherently not codable concepts without codable children: Secondary | ICD-10-CM | POA: Insufficient documentation

## 2012-05-22 NOTE — Progress Notes (Signed)
Physical Therapy Treatment Patient Details  Name: Jared Griffin MRN: [id number] Date of Birth: November 20, 1960  Today's Date: 05/22/2012 Time: 3403-7096 PT Time Calculation (min): 55 min  Visit#: 2  of 3   Re-eval:    Charge: therex 38' IFES 15 min  Subjective: Symptoms/Limitations Symptoms: Pt reported increased LBP around 8-9/10 last night required pain meds.  Pain scale 3/10 this afternoon with no pain meds taken today. Pain Assessment Currently in Pain?: Yes Pain Score:   3 Pain Location: Back Pain Orientation: Mid;Lower;Right  Objective:   Exercise/Treatments Stretches Active Hamstring Stretch: 2 reps;30 seconds Lower Trunk Rotation: 5 reps;20 seconds Seated Other Seated Lumbar Exercises: Heel and Toe Roll in and outs 10x10 sec holds each Supine Ab Set: Limitations AB Set Limitations: NMR to establish 5x10 sec holds after Bent Knee Raise: 10 reps;5 seconds Bridge: 10 reps Prone  Single Arm Raise: 5 reps;5 seconds Straight Leg Raise: 5 reps;5 seconds Other Prone Lumbar Exercises: Multifidus 10x10 sec holds  Modalities Modalities: Systems analyst Stimulation Location: to lumbar spine Electrical Stimulation Action: IFES to reduce pain Electrical Stimulation Parameters: Hi 150/ lo 80 sweep 8.2 v in supine position with legs elevated  Physical Therapy Assessment and Plan PT Assessment and Plan Clinical Impression Statement: Began treatment for core strengthening, stability and pain reduction per PT POC.  Pt able to complete HEP exercises with min cueing for proper technique, pt did require tactile cueing for correct musculature contraction with PFC.  Ended session with estim, pt reported pain scale 1/10. PT Plan: Complete SI alignment check at beginning of session.  Continue with core stablizations exercises and flexibility, manual techniques to decrease pain and improve mobility, may continue with E-stim if needed    Goals     Problem List Patient Active Problem List  Diagnosis  . DM  . HYPERLIPIDEMIA-MIXED  . ESSENTIAL HYPERTENSION, BENIGN  . CAD, NATIVE VESSEL  . CHEST PAIN UNSPECIFIED  . ABNORMAL CV (STRESS) TEST  . ABDOMINAL PAIN, HX OF  . Lumbago    PT - End of Session Activity Tolerance: Patient tolerated treatment well General Behavior During Session: [facility] for tasks performed Cognition: [facility] for tasks performed  GP    Aldona Lento 05/22/2012, 5:10 PM

## 2012-05-23 ENCOUNTER — Ambulatory Visit (HOSPITAL_COMMUNITY)
Admission: RE | Admit: 2012-05-23 | Discharge: 2012-05-23 | Disposition: A | Discharge status: Home / Self Care | Payer: Worker's Compensation | Source: Ambulatory Visit | Attending: Family Medicine | Admitting: Family Medicine

## 2012-05-23 DIAGNOSIS — I10 Essential (primary) hypertension: Secondary | ICD-10-CM | POA: Insufficient documentation

## 2012-05-23 DIAGNOSIS — M6281 Muscle weakness (generalized): Secondary | ICD-10-CM | POA: Insufficient documentation

## 2012-05-23 DIAGNOSIS — M545 Low back pain, unspecified: Secondary | ICD-10-CM | POA: Insufficient documentation

## 2012-05-23 DIAGNOSIS — E119 Type 2 diabetes mellitus without complications: Secondary | ICD-10-CM | POA: Insufficient documentation

## 2012-05-23 DIAGNOSIS — IMO0001 Reserved for inherently not codable concepts without codable children: Secondary | ICD-10-CM | POA: Insufficient documentation

## 2012-05-23 NOTE — Progress Notes (Signed)
Physical Therapy Treatment Patient Details  Name: DAIN LASETER MRN: [id number] Date of Birth: 05-02-1961  Today's Date: 05/23/2012 Time: 4098-1191 PT Time Calculation (min): 53 min  Visit#: 3  of 3   Re-eval:   Assessment Diagnosis: LBP Next MD Visit: 05/26/2012 Charge: manual 10, self care 10', therex 32  Subjective: Symptoms/Limitations Symptoms: Pt stated IFES felt good through the night but pain increased the next morning.  Pain scale 2-3/10 R buttock region. How long can you sit comfortably?: 20-30 minutes How long can you stand comfortably?: 30-45 minutes How long can you walk comfortably?: 5-10 Pain Assessment Currently in Pain?: Yes Pain Score:   3 Pain Location: Buttocks Pain Orientation: Right  Objective:   Exercise/Treatments Stretches Active Hamstring Stretch: 3 reps;30 seconds Lower Trunk Rotation: 5 reps;20 seconds Aerobic Tread Mill: 5' @ 2.3 mph following MET Supine Ab Set: Limitations AB Set Limitations: NMR to establish 10x10 sec holds after Bent Knee Raise: 10 reps;5 seconds Bridge: 10 reps Prone  Single Arm Raise: 10 reps;5 seconds Straight Leg Raise: 10 reps;5 seconds Opposite Arm/Leg Raise: 5 reps;5 seconds Other Prone Lumbar Exercises: Multifidus 10x10 sec holds  Manual Therapy Manual Therapy: Other (comment) Other Manual Therapy: SI alignment check.  Pubic clearing with 5' on treadmill following.  Physical Therapy Assessment and Plan PT Assessment and Plan Clinical Impression Statement: Third session per workers comp.  Reassessment complete prior MD apt on 05/26/2012 with the following findings:  Mr Snowdon has had 3 OPPT sessions and has met 1/4 STGs.  Pt is compliant with HEP exercises to improve core coordination and able to demosntrate most exercises with good  technique but does still require tactile and verbal cueing for correct musculature activation with PFC and TrA contractions.  Pt is able to demonstrate appropriate multifidus  contraction with no cueing required for 10 second holds.  Pt stated pain average 3/10 with pain increase while sitting or standing for greater than 30 minutes.  Pt has been educated and able to demonstrate proper posture and demonstrate stretches for increased flexibility.  Pt has not met the goal for improved perceived functional abilities PT Plan: F/U with MD.  Recommend continuing OPPT to improved core coordination to help reduce pain for improved percieved functional abilities.    Goals Home Exercise Program PT Goal: Perform Home Exercise Program - Progress: Met (daily) PT Short Term Goals PT Short Term Goal 1 - Progress: Not met PT Short Term Goal 2 - Progress: Progressing toward goal PT Short Term Goal 3 - Progress: Not met (46) PT Short Term Goal 4 - Progress: Met  Problem List Patient Active Problem List  Diagnosis  . DM  . HYPERLIPIDEMIA-MIXED  . ESSENTIAL HYPERTENSION, BENIGN  . CAD, NATIVE VESSEL  . CHEST PAIN UNSPECIFIED  . ABNORMAL CV (STRESS) TEST  . ABDOMINAL PAIN, HX OF  . Lumbago    PT - End of Session Activity Tolerance: Patient tolerated treatment well General Behavior During Session: [facility] for tasks performed Cognition: [facility] for tasks performed  GP    Aldona Lento 05/23/2012, 6:52 PM

## 2012-06-05 ENCOUNTER — Telehealth (INDEPENDENT_AMBULATORY_CARE_PROVIDER_SITE_OTHER): Payer: Self-pay | Admitting: *Deleted

## 2012-06-05 ENCOUNTER — Other Ambulatory Visit: Payer: Self-pay | Admitting: Family Medicine

## 2012-06-05 DIAGNOSIS — N2889 Other specified disorders of kidney and ureter: Secondary | ICD-10-CM

## 2012-06-05 NOTE — Telephone Encounter (Signed)
agree

## 2012-06-05 NOTE — Telephone Encounter (Signed)
PCP/Requesting MD: Mickie Hillier  Name & DOB: fate caster 04-22-2061     Procedure: tcs   Reason/Indication:  screening  Has patient had this procedure before?  no  If so, when, by whom and where?    Is there a family history of colon cancer?  no  Who?  What age when diagnosed?    Is patient diabetic?   yes      Does patient have prosthetic heart valve?  no  Do you have a pacemaker?  no  Has patient had joint replacement within last 12 months?  no  Is patient on Coumadin, Plavix and/or Aspirin? yes  Medications: asa 81 mg daily, gemfibrozil 619m bid, enalapril 10 mg daily, metformin 5061mbid in am, 1 at lunch & 2 in evening, fish oil  Allergies: nkda  Medication Adjustment: asa 2 days, hold evening dose of metformin  Procedure date & time: 06/19/12 at 930

## 2012-06-09 ENCOUNTER — Ambulatory Visit (HOSPITAL_COMMUNITY)
Admission: RE | Admit: 2012-06-09 | Discharge: 2012-06-09 | Disposition: A | Discharge status: Home / Self Care | Payer: PRIVATE HEALTH INSURANCE | Source: Ambulatory Visit | Attending: Family Medicine | Admitting: Family Medicine

## 2012-06-09 DIAGNOSIS — K7689 Other specified diseases of liver: Secondary | ICD-10-CM | POA: Insufficient documentation

## 2012-06-09 DIAGNOSIS — N270 Small kidney, unilateral: Secondary | ICD-10-CM | POA: Insufficient documentation

## 2012-06-09 DIAGNOSIS — N2889 Other specified disorders of kidney and ureter: Secondary | ICD-10-CM

## 2012-06-11 ENCOUNTER — Ambulatory Visit (HOSPITAL_COMMUNITY)
Admission: RE | Admit: 2012-06-11 | Discharge: 2012-06-11 | Disposition: A | Discharge status: Home / Self Care | Payer: Worker's Compensation | Source: Ambulatory Visit | Attending: Family Medicine | Admitting: Family Medicine

## 2012-06-11 ENCOUNTER — Encounter (HOSPITAL_COMMUNITY): Payer: Self-pay | Admitting: Pharmacy Technician

## 2012-06-11 DIAGNOSIS — M545 Low back pain, unspecified: Secondary | ICD-10-CM | POA: Insufficient documentation

## 2012-06-11 DIAGNOSIS — E119 Type 2 diabetes mellitus without complications: Secondary | ICD-10-CM | POA: Insufficient documentation

## 2012-06-11 DIAGNOSIS — M6281 Muscle weakness (generalized): Secondary | ICD-10-CM | POA: Insufficient documentation

## 2012-06-11 DIAGNOSIS — IMO0001 Reserved for inherently not codable concepts without codable children: Secondary | ICD-10-CM | POA: Insufficient documentation

## 2012-06-11 NOTE — Progress Notes (Signed)
Physical Therapy Treatment Patient Details  Name: KYSHON TOLLIVER MRN: [id number] Date of Birth: 10/17/1960  Today's Date: 06/11/2012 Time: 3016-0109 PT Time Calculation (min): 30 min Charges: 64' TE Visit#: 4  of 10   Re-eval: 07/04/12   Subjective: Symptoms/Limitations Symptoms: Pt referred back to PT for work conditioning for LBP.  He reports that he has one spot on his back that remains uncomfortable (2/10 pain) which bothers him mostly when sitting in the car.  He currently is on light duty.  he reports that he continues to work on his exercises at home Pain Assessment Currently in Pain?: Yes Pain Score:   2 Pain Location: Back Pain Orientation: Right  Precautions/Restrictions     Exercise/Treatments Stretches Passive Hamstring Stretch: 2 reps;60 seconds (standing) Piriformis Stretch: 30 seconds;4 reps (BLE, seated) Aerobic Tread Mill: 8' 2.5 mph w/cueing for posture Standing Other Standing Lumbar Exercises: Lunge walking and side squat walking 2 RT ea; walking knee to chest stretch 1 RT  Manual Therapy Joint Mobilization: Grade 1-IV to R SI joint - pain decreased  Physical Therapy Assessment and Plan PT Assessment and Plan Clinical Impression Statement: treatment focused on functional stretching and strengthening techniques for RTW.  Had decreased pain after.  PT Plan: cont w/RTW activities  And decreasing overall pain.  Cont with core stability activities  Goals    Problem List Patient Active Problem List  Diagnosis  . DM  . HYPERLIPIDEMIA-MIXED  . ESSENTIAL HYPERTENSION, BENIGN  . CAD, NATIVE VESSEL  . CHEST PAIN UNSPECIFIED  . ABNORMAL CV (STRESS) TEST  . ABDOMINAL PAIN, HX OF  . Lumbago    PT - End of Session Activity Tolerance: Patient tolerated treatment well General Behavior During Session: [facility] for tasks performed Cognition: [facility] for tasks performed  Chloe Flis, PT 06/11/2012, 5:20 PM

## 2012-06-16 ENCOUNTER — Ambulatory Visit (HOSPITAL_COMMUNITY)
Admission: RE | Admit: 2012-06-16 | Discharge: 2012-06-16 | Disposition: A | Discharge status: Home / Self Care | Payer: Worker's Compensation | Source: Ambulatory Visit | Attending: Physical Therapy | Admitting: Physical Therapy

## 2012-06-16 NOTE — Progress Notes (Signed)
Physical Therapy Treatment Patient Details  Name: Jared Griffin MRN: [id number] Date of Birth: 14-Feb-1961  Today's Date: 06/16/2012 Time: 3664-4034 PT Time Calculation (min): 33 min  Visit#: 5  of 10   Re-eval: 06/04/12 Charges:  therex 30'  Subjective: Symptoms/Limitations Symptoms: Pt. states he has returned to walking 2 miles a day and riding his stationary bike.  Currently painfree and states he has not had any since Saturday and it was low (1-2/10).   Pain Assessment Currently in Pain?: No/denies   Exercise/Treatments Stretches Passive Hamstring Stretch: 2 reps;60 seconds;Limitations Passive Hamstring Stretch Limitations: B LE standing Piriformis Stretch: 30 seconds;4 reps;Limitations Piriformis Stretch Limitations: B LE seated  Aerobic Tread Mill: 8' 2.5 mph w/cueing for posture Standing Other Standing Lumbar Exercises: Lunge walking and side squat walking 2 RT ea; walking knee to chest stretch 1 RT  Physical Therapy Assessment and Plan PT Assessment and Plan Clinical Impression Statement: Overall improvement with desire to RTW early if able.  Pt. required VC's with stretching for proper form, otherwise displays good form and mechanics with other activities.  No pain at end of session. PT Plan: Continue with RTW activities; Re-eval X 3 more visits.     Problem List Patient Active Problem List  Diagnosis  . DM  . HYPERLIPIDEMIA-MIXED  . ESSENTIAL HYPERTENSION, BENIGN  . CAD, NATIVE VESSEL  . CHEST PAIN UNSPECIFIED  . ABNORMAL CV (STRESS) TEST  . ABDOMINAL PAIN, HX OF  . Lumbago    PT - End of Session Activity Tolerance: Patient tolerated treatment well General Behavior During Session: [facility] for tasks performed Cognition: [facility] for tasks performed   Teena Irani, PTA/CLT 06/16/2012, 2:26 PM

## 2012-06-17 ENCOUNTER — Ambulatory Visit (HOSPITAL_COMMUNITY)
Admission: RE | Admit: 2012-06-17 | Discharge: 2012-06-17 | Disposition: A | Discharge status: Home / Self Care | Payer: PRIVATE HEALTH INSURANCE | Source: Ambulatory Visit | Attending: Family Medicine | Admitting: Family Medicine

## 2012-06-17 DIAGNOSIS — M545 Low back pain, unspecified: Secondary | ICD-10-CM | POA: Insufficient documentation

## 2012-06-17 DIAGNOSIS — E119 Type 2 diabetes mellitus without complications: Secondary | ICD-10-CM | POA: Insufficient documentation

## 2012-06-17 DIAGNOSIS — M6281 Muscle weakness (generalized): Secondary | ICD-10-CM | POA: Insufficient documentation

## 2012-06-17 DIAGNOSIS — IMO0001 Reserved for inherently not codable concepts without codable children: Secondary | ICD-10-CM | POA: Insufficient documentation

## 2012-06-17 NOTE — Progress Notes (Signed)
Physical Therapy Treatment Patient Details  Name: Jared Griffin MRN: [id number] Date of Birth: 1961-07-23  Today's Date: 06/17/2012 Time: 1520-1545 PT Time Calculation (min): 25 min Visit#: 6  of 10   Re-eval: 06/25/12 Authorization: Workers Comp  Charges:  therex 24'  Subjective: Symptoms/Limitations Symptoms: Pt. states he continues to do well; Returns to MD next week Pain Assessment Currently in Pain?: No/denies   Exercise/Treatments Aerobic Tread Mill: 8' 2.5 mph w/cueing for posture Standing Other Standing Lumbar Exercises: Lunge walking and side squat walking 2 RT ea; walking knee to chest stretch 1 RT Other Standing Lumbar Exercises: kneeling 1X each    Physical Therapy Assessment and Plan PT Assessment and Plan Clinical Impression Statement: Pt. without diffiuculty or pain performing exercises/tasks.  Overall progressing well and desire to RTW.  Pt. returns to MD next week. PT Plan: Re-evaluate next visit for return to MD.     Problem List Patient Active Problem List  Diagnosis  . DM  . HYPERLIPIDEMIA-MIXED  . ESSENTIAL HYPERTENSION, BENIGN  . CAD, NATIVE VESSEL  . CHEST PAIN UNSPECIFIED  . ABNORMAL CV (STRESS) TEST  . ABDOMINAL PAIN, HX OF  . Lumbago    PT - End of Session Activity Tolerance: Patient tolerated treatment well General Behavior During Session: [facility] for tasks performed Cognition: [facility] for tasks performed   Teena Irani, PTA/CLT 06/17/2012, 3:56 PM

## 2012-06-18 ENCOUNTER — Ambulatory Visit (HOSPITAL_COMMUNITY): Payer: Self-pay | Admitting: Physical Therapy

## 2012-06-19 ENCOUNTER — Ambulatory Visit (HOSPITAL_COMMUNITY)
Admission: RE | Admit: 2012-06-19 | Discharge: 2012-06-19 | Disposition: A | Discharge status: Home / Self Care | Payer: PRIVATE HEALTH INSURANCE | Source: Ambulatory Visit | Attending: Internal Medicine | Admitting: Internal Medicine

## 2012-06-19 ENCOUNTER — Encounter (HOSPITAL_COMMUNITY): Payer: Self-pay | Admitting: *Deleted

## 2012-06-19 ENCOUNTER — Ambulatory Visit (HOSPITAL_COMMUNITY): Payer: Self-pay

## 2012-06-19 ENCOUNTER — Encounter (HOSPITAL_COMMUNITY)
Admission: RE | Disposition: A | Discharge status: Home / Self Care | Payer: Self-pay | Source: Ambulatory Visit | Attending: Internal Medicine

## 2012-06-19 DIAGNOSIS — I10 Essential (primary) hypertension: Secondary | ICD-10-CM | POA: Insufficient documentation

## 2012-06-19 DIAGNOSIS — Z1211 Encounter for screening for malignant neoplasm of colon: Secondary | ICD-10-CM

## 2012-06-19 DIAGNOSIS — D126 Benign neoplasm of colon, unspecified: Secondary | ICD-10-CM

## 2012-06-19 DIAGNOSIS — E7800 Pure hypercholesterolemia, unspecified: Secondary | ICD-10-CM | POA: Insufficient documentation

## 2012-06-19 DIAGNOSIS — E119 Type 2 diabetes mellitus without complications: Secondary | ICD-10-CM | POA: Insufficient documentation

## 2012-06-19 DIAGNOSIS — Z01812 Encounter for preprocedural laboratory examination: Secondary | ICD-10-CM | POA: Insufficient documentation

## 2012-06-19 DIAGNOSIS — K648 Other hemorrhoids: Secondary | ICD-10-CM | POA: Insufficient documentation

## 2012-06-19 HISTORY — PX: COLONOSCOPY: SHX5424

## 2012-06-19 HISTORY — DX: Unspecified cirrhosis of liver: K74.60

## 2012-06-19 LAB — GLUCOSE, CAPILLARY: Glucose-Capillary: 126 mg/dL — ABNORMAL HIGH (ref 70–99)

## 2012-06-19 SURGERY — COLONOSCOPY
Anesthesia: Moderate Sedation

## 2012-06-19 MED ORDER — MEPERIDINE HCL 50 MG/ML IJ SOLN
INTRAMUSCULAR | Status: DC | PRN
  Administered 2012-06-19 (×2): 25 mg via INTRAVENOUS

## 2012-06-19 MED ORDER — MEPERIDINE HCL 50 MG/ML IJ SOLN
INTRAMUSCULAR | Status: AC
  Filled 2012-06-19: qty 1

## 2012-06-19 MED ORDER — STERILE WATER FOR IRRIGATION IR SOLN
Status: DC | PRN
  Administered 2012-06-19: 09:00:00

## 2012-06-19 MED ORDER — MIDAZOLAM HCL 5 MG/5ML IJ SOLN
INTRAMUSCULAR | Status: AC
  Filled 2012-06-19: qty 10

## 2012-06-19 MED ORDER — SODIUM CHLORIDE 0.45 % IV SOLN
INTRAVENOUS | Status: DC
  Administered 2012-06-19: 1000 mL via INTRAVENOUS

## 2012-06-19 MED ORDER — MIDAZOLAM HCL 5 MG/5ML IJ SOLN
INTRAMUSCULAR | Status: DC | PRN
  Administered 2012-06-19: 1 mg via INTRAVENOUS
  Administered 2012-06-19 (×2): 2 mg via INTRAVENOUS

## 2012-06-19 MED ORDER — HYOSCYAMINE SULFATE 0.125 MG SL SUBL: 0.1250 mg | SUBLINGUAL_TABLET | Freq: Three times a day (TID) | SUBLINGUAL | Status: DC | PRN

## 2012-06-19 NOTE — Op Note (Signed)
COLONOSCOPY PROCEDURE REPORT  PATIENT:  Jared Griffin  MR#:  [id number] Birthdate:  28-Feb-1961, 51 y.o., male Endoscopist:  Dr. [name], MD Referred By:  Dr. Margaretmary Eddy, MD Procedure Date: 06/19/2012  Procedure:   Colonoscopy  Indications:  Patient is 61 old Caucasian male was undergoing average risk screening colonoscopy. He does complain of intermittent LLQ abdominal pain.  Informed Consent:  The procedure and risks were reviewed with the patient and informed consent was obtained.  Medications:  Demerol 50 mg IV Versed 5 mg IV  Description of procedure:  After a digital rectal exam was performed, that colonoscope was advanced from the anus through the rectum and colon to the area of the cecum, ileocecal valve and appendiceal orifice. The cecum was deeply intubated. These structures were well-seen and photographed for the record. From the level of the cecum and ileocecal valve, the scope was slowly and cautiously withdrawn. The mucosal surfaces were carefully surveyed utilizing scope tip to flexion to facilitate fold flattening as needed. The scope was pulled down into the rectum where a thorough exam including retroflexion was performed. Terminal ileum was also examined.  Findings:   Prep satisfactory. Normal terminal ileal mucosa. 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 2 small polyps ablated via cold biopsy and submitted together(hepatic and splenic flexure). 2 more small polyps ablated via cold biopsy and submitted together(descending and sigmoid colon). Normal rectal mucosa. Small hemorrhoids above the dentate line with focal erythema.  Therapeutic/Diagnostic Maneuvers Performed:  See above  Complications:  None  Cecal Withdrawal Time:  22 minutes  Impression:  Normal terminal ileum. 4 small polyps ablated via cold biopsy. These were submitted in pairs as above. Incidental finding of small submucosal lipoma at hepatic  flexure. Internal hemorrhoids.  Recommendations:  Standard instructions given. Hyoscyamine sublingual 3 times a day when necessary. I will contact patient with biopsy results and further recommendations.  Deunta Beneke U  06/19/2012 9:44 AM  CC: Dr. Rubbie Battiest, MD & Dr. Rayne Du ref. provider found

## 2012-06-19 NOTE — H&P (Signed)
Jared Griffin is an 51 y.o. male.   Chief Complaint: Patient is here for colonoscopy. HPI: Patient is 51 year old Caucasian male who is in for screening colonoscopy. He denies recent change in bowel habits or rectal bleeding. He has noted intermittent pain and left lower quadrant which is not associated with dysuria or diarrhea. He has good appetite and his weight has been stable. Family history is negative for colorectal carcinoma.  Past Medical History  Diagnosis Date  . Diabetes mellitus   . History of abdominal pain   . Hypertension   . High cholesterol 2006  . 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 Arthritis     Past Surgical History  Procedure Date  . Liver biopsy   . Cholecystectomy 2003  . Knee surgery 1999  . Cardiac catheterization 2011    History reviewed. No pertinent family history. Social History:  reports that he quit smoking about 28 years ago. His smoking use included Cigarettes. He has a 5 pack-year smoking history. He does not have any smokeless tobacco history on file. He reports that he does not drink alcohol or use illicit drugs.  Allergies: No Known Allergies  Medications Prior to Admission  Medication Sig Dispense Refill  . acetaminophen (TYLENOL) 500 MG tablet Take 1,000 mg by mouth every 6 (six) hours as needed. Pain.      Marland Kitchen aspirin (ASPIR-LOW) 81 MG EC tablet Take 162 mg by mouth daily.       . enalapril (VASOTEC) 10 MG tablet Take 10 mg by mouth daily.      Marland Kitchen gemfibrozil (LOPID) 600 MG tablet Take 600 mg by mouth 2 (two) times daily.        Marland Kitchen HYDROcodone-acetaminophen (VICODIN ES) 7.5-750 MG per tablet Take 1 tablet by mouth every 6 (six) hours as needed. Neck.      . metFORMIN (GLUCOPHAGE) 500 MG tablet Take 1,000 mg by mouth 3 (three) times daily.       . nabumetone (RELAFEN) 750 MG tablet Take 750 mg by mouth 2 (two) times daily as needed. Back pain.      . peg 3350 powder (MOVIPREP) 100 G SOLR Take 1 kit (100 g total) by mouth  once.  1 kit  0  . pseudoephedrine-acetaminophen (TYLENOL SINUS) 30-500 MG TABS Take 1 tablet by mouth every 4 (four) hours as needed. Allergies.        No results found for this or any previous visit (from the past 48 hour(s)). No results found.  ROS  Blood pressure 126/89, pulse 71, temperature 98 F (36.7 C), temperature source Oral, resp. rate 19, height 6' 2"  (1.88 m), weight 238 lb (107.956 kg), SpO2 98.00%. Physical Exam  Constitutional: He appears well-developed and well-nourished.  HENT:  Mouth/Throat: Oropharynx is clear and moist.  Eyes: Conjunctivae normal are normal. No scleral icterus.  Neck: No thyromegaly present.  Cardiovascular: Normal rate, regular rhythm and normal heart sounds.   No murmur heard. Respiratory: Effort normal and breath sounds normal.  GI: Soft. He exhibits no distension and no mass. There is no tenderness.  Musculoskeletal: He exhibits no edema.  Lymphadenopathy:    He has no cervical adenopathy.  Neurological: He is alert.  Skin: Skin is warm and dry.     Assessment/Plan Average risk screening colonoscopy.  REHMAN,NAJEEB U 06/19/2012, 9:07 AM

## 2012-06-23 ENCOUNTER — Ambulatory Visit (HOSPITAL_COMMUNITY)
Admission: RE | Admit: 2012-06-23 | Discharge: 2012-06-23 | Disposition: A | Discharge status: Home / Self Care | Payer: PRIVATE HEALTH INSURANCE | Source: Ambulatory Visit | Attending: Physical Therapy | Admitting: Physical Therapy

## 2012-06-23 NOTE — Evaluation (Signed)
Physical Therapy Discharge Summary  Patient Details  Name: Jared Griffin MRN: [id number] Date of Birth: Aug 20, 1961  Today's Date: 06/23/2012 Time: 1510-1525 PT Time Calculation (min): 15 min Charges: 15' [place], 1 ROM  Visit#: 7  of    Re-eval:   Assessment Diagnosis: LBP Next MD Visit: 06/25/12  Subjective Symptoms/Limitations Symptoms: Pt returns today and reports that he is doing well overall.  He has been walking about 2 miles for exercise and has tried change his eating habits.  Pain Assessment Currently in Pain?: No/denies  Cognition/Observation Observation/Other Assessments Observations: Able to independently coordinate all core musculature  Sensation/Coordination/Flexibility/Functional Tests Functional Tests Functional Tests: ODI: 0% (was 46% impairment)  Assessment Lumbar AROM Lumbar Flexion: WNL  (was de) Lumbar Extension: WNL (was decreased by 10%) Lumbar - Right Side Bend: WNL Lumbar - Left Side Bend: WNL Palpation Palpation: unable to palpate pain or tenderness  Mobility/Balance  Posture/Postural Control Posture/Postural Control: No significant limitations (was limited with poor posture)     Physical Therapy Assessment and Plan PT Assessment and Plan Clinical Impression Statement: Pt was referred to PT secondary to acute LBP from a work injury.  He has successfully completed PT and has met all goals and currently has not been expirencing pain.  At this time pt will be D/C from PT w/HEP PT Plan: D/C w/HEP    Goals Home Exercise Program Pt will Perform Home Exercise Program: Independently PT Goal: Perform Home Exercise Program - Progress: Met PT Short Term Goals Time to Complete Short Term Goals:  (3 visits) PT Short Term Goal 1: Pt will report pain less than 2/10 for 75% of his day for improved QOL and for safe return to full duty at work.  PT Short Term Goal 1 - Progress: Met PT Short Term Goal 2: Pt will improve his core coordination and  demonstrate 10x10 sec holds for TrA, multifidus and PF musculature in order to tolerate standing for greater than 1 hour.  PT Short Term Goal 2 - Progress: Met PT Short Term Goal 3: Pt will decrease his ODI to less than or eqaul to 30 for improved percieved functional abilty.  PT Short Term Goal 3 - Progress: Met PT Short Term Goal 4: Pt will be educated on proper posture and flexibility program.  PT Short Term Goal 4 - Progress: Met  Problem List Patient Active Problem List  Diagnosis  . DM  . HYPERLIPIDEMIA-MIXED  . ESSENTIAL HYPERTENSION, BENIGN  . CAD, NATIVE VESSEL  . CHEST PAIN UNSPECIFIED  . ABNORMAL CV (STRESS) TEST  . ABDOMINAL PAIN, HX OF  . Lumbago    PT Plan of Care PT Patient Instructions: Discussed ODI.  Consulted and Agree with Plan of Care: Patient    Geoffery Lyons, PT 06/23/2012, 3:27 PM

## 2012-06-25 ENCOUNTER — Inpatient Hospital Stay (HOSPITAL_COMMUNITY): Admission: RE | Admit: 2012-06-25 | Payer: Self-pay | Source: Ambulatory Visit | Admitting: Physical Therapy

## 2012-06-25 ENCOUNTER — Encounter (HOSPITAL_COMMUNITY): Payer: Self-pay | Admitting: Internal Medicine

## 2012-06-25 ENCOUNTER — Encounter (INDEPENDENT_AMBULATORY_CARE_PROVIDER_SITE_OTHER): Payer: Self-pay | Admitting: *Deleted

## 2012-06-26 ENCOUNTER — Ambulatory Visit (HOSPITAL_COMMUNITY): Payer: Self-pay

## 2012-11-22 ENCOUNTER — Encounter: Payer: Self-pay | Admitting: *Deleted

## 2012-12-27 ENCOUNTER — Other Ambulatory Visit (HOSPITAL_COMMUNITY): Payer: Self-pay | Admitting: Family Medicine

## 2013-02-15 ENCOUNTER — Other Ambulatory Visit (HOSPITAL_COMMUNITY): Payer: Self-pay | Admitting: Family Medicine

## 2013-05-05 ENCOUNTER — Ambulatory Visit (INDEPENDENT_AMBULATORY_CARE_PROVIDER_SITE_OTHER): Payer: PRIVATE HEALTH INSURANCE | Admitting: Family Medicine

## 2013-05-05 ENCOUNTER — Encounter: Payer: Self-pay | Admitting: Family Medicine

## 2013-05-05 VITALS — BP 114/86 | Ht 74.0 in | Wt 245.0 lb

## 2013-05-05 DIAGNOSIS — I10 Essential (primary) hypertension: Secondary | ICD-10-CM

## 2013-05-05 DIAGNOSIS — K760 Fatty (change of) liver, not elsewhere classified: Secondary | ICD-10-CM

## 2013-05-05 DIAGNOSIS — E785 Hyperlipidemia, unspecified: Secondary | ICD-10-CM

## 2013-05-05 DIAGNOSIS — K7689 Other specified diseases of liver: Secondary | ICD-10-CM

## 2013-05-05 DIAGNOSIS — Z79899 Other long term (current) drug therapy: Secondary | ICD-10-CM

## 2013-05-05 DIAGNOSIS — E119 Type 2 diabetes mellitus without complications: Secondary | ICD-10-CM

## 2013-05-05 LAB — POCT GLYCOSYLATED HEMOGLOBIN (HGB A1C): Hemoglobin A1C: 6.9

## 2013-05-05 NOTE — Progress Notes (Signed)
  Subjective:    Patient ID: Jared Griffin, male    DOB: 15-Nov-1960, 52 y.o.   MRN: [id number]  Diabetes He has type 2 diabetes mellitus. His disease course has been improving. Pertinent negatives for diabetes include no chest pain and no fatigue. Pertinent negatives for hypoglycemia complications include no blackouts. Symptoms are improving. Pertinent negatives for diabetic complications include no impotence. Risk factors for coronary artery disease include diabetes mellitus, dyslipidemia, hypertension and male sex. His weight is decreasing steadily. He is following a generally healthy diet. Meal planning includes avoidance of concentrated sweets. He has not had a previous visit with a dietician. His home blood glucose trend is decreasing rapidly. His breakfast blood glucose is taken between 7-8 am. An ACE inhibitor/angiotensin II receptor blocker is being taken. He does not see a podiatrist.Eye exam is current.    Results for orders placed in visit on 05/05/13  POCT GLYCOSYLATED HEMOGLOBIN (HGB A1C)      Result Value Range   Hemoglobin A1C 6.9     Not exercising as much. Ball of feet-painful. On cement a lot at work.  hnew pair of shoes.  Staying with bp meds.watcing salt intake.  Watching fats, compliant with meds  Review of Systems  Constitutional: Negative for fatigue.  Cardiovascular: Negative for chest pain.  Genitourinary: Negative for impotence.       Objective:   Physical Exam  Alert no acute distress. HEENT normal. Lungs clear. Heart regular rate rhythm. Vitals reviewed. Feet pulses intact sensation good. Distal feet tender to palpation on plantar surface      Assessment & Plan:  Impression #1 type 2 diabetes control good with A1c 6.9 #2 hyperlipidemia discussed numbers 3 hypertension good control. #4 metatarsalgia. With history of Morton's neuroma. Plan use meloxicam as needed for foot pain given by podiatrist. Followup with podiatrist if necessary. Diet exercise  discussed. Appropriate blood work further recommendations based on results. See eye doctor regularly. Hydrocodone refilled for occasional use only. WSL recheck in 6 months

## 2013-05-07 LAB — LIPID PANEL
Cholesterol: 195 mg/dL (ref 0–200)
HDL: 37 mg/dL — ABNORMAL LOW (ref >39)
LDL Cholesterol: 98 mg/dL (ref 0–99)
Total CHOL/HDL Ratio: 5.3 Ratio
Triglycerides: 300 mg/dL — ABNORMAL HIGH (ref <150)
VLDL: 60 mg/dL — ABNORMAL HIGH (ref 0–40)

## 2013-05-07 LAB — HEPATIC FUNCTION PANEL
ALT: 53 U/L (ref 0–53)
AST: 32 U/L (ref 0–37)
Albumin: 4.5 g/dL (ref 3.5–5.2)
Alkaline Phosphatase: 37 U/L — ABNORMAL LOW (ref 39–117)
Bilirubin, Direct: 0.1 mg/dL (ref 0.0–0.3)
Indirect Bilirubin: 0.5 mg/dL (ref 0.0–0.9)
Total Bilirubin: 0.6 mg/dL (ref 0.3–1.2)
Total Protein: 6.7 g/dL (ref 6.0–8.3)

## 2013-05-08 LAB — MICROALBUMIN, URINE: Microalb, Ur: 0.5 mg/dL (ref 0.00–1.89)

## 2013-05-10 ENCOUNTER — Encounter: Payer: Self-pay | Admitting: Family Medicine

## 2013-05-22 ENCOUNTER — Telehealth: Payer: Self-pay | Admitting: Family Medicine

## 2013-05-22 NOTE — Telephone Encounter (Signed)
Pt on hydrocodone 5/325 is not helping with his neck pain, can he get something a little stronger? Or something different? [facility]

## 2013-05-22 NOTE — Telephone Encounter (Signed)
Anything stgronger is highly scheduled narcotic and will rfequire a visit to discuss

## 2013-05-22 NOTE — Telephone Encounter (Signed)
Patient stated he didn't have time to make an appointment at this time. I advised him to call back and schedule an appointment when he had the time.

## 2013-07-06 ENCOUNTER — Other Ambulatory Visit: Payer: Self-pay | Admitting: Family Medicine

## 2013-07-06 ENCOUNTER — Other Ambulatory Visit (HOSPITAL_COMMUNITY): Payer: Self-pay | Admitting: Family Medicine

## 2013-08-30 ENCOUNTER — Other Ambulatory Visit (HOSPITAL_COMMUNITY): Payer: Self-pay | Admitting: Family Medicine

## 2013-10-01 ENCOUNTER — Encounter: Payer: Self-pay | Admitting: Family Medicine

## 2013-10-01 ENCOUNTER — Ambulatory Visit (INDEPENDENT_AMBULATORY_CARE_PROVIDER_SITE_OTHER): Payer: PRIVATE HEALTH INSURANCE | Admitting: Family Medicine

## 2013-10-01 VITALS — BP 118/80 | Temp 99.1°F | Ht 74.0 in | Wt 239.0 lb

## 2013-10-01 DIAGNOSIS — J019 Acute sinusitis, unspecified: Secondary | ICD-10-CM

## 2013-10-01 MED ORDER — LEVOFLOXACIN 500 MG PO TABS: 500.0000 mg | ORAL_TABLET | Freq: Every day | ORAL | Status: DC

## 2013-10-01 NOTE — Progress Notes (Signed)
   Subjective:    Patient ID: Jared Griffin, male    DOB: [birth year], 53 y.o.   MRN: [id number]  Sinusitis This is a new problem. The current episode started in the past 7 days. Maximum temperature: Low grade. Associated symptoms include congestion, coughing, headaches and sinus pressure. Past treatments include acetaminophen and oral decongestants. The treatment provided mild relief.    PMH benign, hypertension heart disease diabetes  Review of Systems  HENT: Positive for congestion and sinus pressure.   Respiratory: Positive for cough.   Neurological: Positive for headaches.       Objective:   Physical Exam Lungs clear mild sinus tenderness eardrums normal throat normal neck supple not respiratory distress       Assessment & Plan:  Sinusitis-antibiotics prescribed. Followup if progressive troubles warning signs discussed. Keep regular followup visits with Dr. Richardson Landry.

## 2013-10-12 ENCOUNTER — Telehealth: Payer: Self-pay | Admitting: Family Medicine

## 2013-10-12 DIAGNOSIS — E785 Hyperlipidemia, unspecified: Secondary | ICD-10-CM

## 2013-10-12 DIAGNOSIS — Z79899 Other long term (current) drug therapy: Secondary | ICD-10-CM

## 2013-10-12 DIAGNOSIS — Z125 Encounter for screening for malignant neoplasm of prostate: Secondary | ICD-10-CM

## 2013-10-12 NOTE — Telephone Encounter (Signed)
Patient needs order for bloodwork

## 2013-10-12 NOTE — Telephone Encounter (Signed)
Left message on wife's voicemail notifying that bloodwork was ordered and report to lab.

## 2013-10-12 NOTE — Telephone Encounter (Signed)
Lip liv m7 psa

## 2013-10-16 LAB — HEPATIC FUNCTION PANEL
ALT: 32 U/L (ref 0–53)
AST: 24 U/L (ref 0–37)
Albumin: 4.2 g/dL (ref 3.5–5.2)
Alkaline Phosphatase: 32 U/L — ABNORMAL LOW (ref 39–117)
Bilirubin, Direct: 0.1 mg/dL (ref 0.0–0.3)
Indirect Bilirubin: 0.3 mg/dL (ref 0.2–1.2)
Total Bilirubin: 0.4 mg/dL (ref 0.2–1.2)
Total Protein: 6.4 g/dL (ref 6.0–8.3)

## 2013-10-16 LAB — LIPID PANEL
Cholesterol: 178 mg/dL (ref 0–200)
HDL: 30 mg/dL — ABNORMAL LOW (ref >39)
Total CHOL/HDL Ratio: 5.9 Ratio
Triglycerides: 411 mg/dL — ABNORMAL HIGH (ref <150)

## 2013-10-16 LAB — BASIC METABOLIC PANEL
BUN: 16 mg/dL (ref 6–23)
CO2: 26 mEq/L (ref 19–32)
Calcium: 9.2 mg/dL (ref 8.4–10.5)
Chloride: 93 mEq/L — ABNORMAL LOW (ref 96–112)
Creat: 0.77 mg/dL (ref 0.50–1.35)
Glucose, Bld: 196 mg/dL — ABNORMAL HIGH (ref 70–99)
Potassium: 3.7 mEq/L (ref 3.5–5.3)
Sodium: 129 mEq/L — ABNORMAL LOW (ref 135–145)

## 2013-10-16 LAB — PSA: PSA: 0.24 ng/mL (ref <=4.00)

## 2013-10-22 ENCOUNTER — Ambulatory Visit (INDEPENDENT_AMBULATORY_CARE_PROVIDER_SITE_OTHER): Payer: PRIVATE HEALTH INSURANCE | Admitting: Family Medicine

## 2013-10-22 ENCOUNTER — Encounter: Payer: Self-pay | Admitting: Family Medicine

## 2013-10-22 VITALS — BP 110/80 | Ht 74.0 in | Wt 241.2 lb

## 2013-10-22 DIAGNOSIS — M545 Low back pain, unspecified: Secondary | ICD-10-CM

## 2013-10-22 DIAGNOSIS — I10 Essential (primary) hypertension: Secondary | ICD-10-CM

## 2013-10-22 DIAGNOSIS — K7689 Other specified diseases of liver: Secondary | ICD-10-CM

## 2013-10-22 DIAGNOSIS — K760 Fatty (change of) liver, not elsewhere classified: Secondary | ICD-10-CM

## 2013-10-22 DIAGNOSIS — E785 Hyperlipidemia, unspecified: Secondary | ICD-10-CM

## 2013-10-22 DIAGNOSIS — E119 Type 2 diabetes mellitus without complications: Secondary | ICD-10-CM

## 2013-10-22 DIAGNOSIS — R079 Chest pain, unspecified: Secondary | ICD-10-CM

## 2013-10-22 LAB — POCT GLYCOSYLATED HEMOGLOBIN (HGB A1C): Hemoglobin A1C: 6.9

## 2013-10-22 MED ORDER — HYDROCODONE-ACETAMINOPHEN 7.5-325 MG PO TABS: 1.0000 | ORAL_TABLET | Freq: Every evening | ORAL | Status: DC | PRN

## 2013-10-22 NOTE — Progress Notes (Signed)
   Subjective:    Patient ID: Jared Griffin, male    DOB: March 01, 1961, 53 y.o.   MRN: [id number]  Diabetes He presents for his follow-up diabetic visit. He has type 2 diabetes mellitus. His disease course has been stable. There are no hypoglycemic associated symptoms. Associated symptoms include chest pain. There are no hypoglycemic complications. Symptoms are stable. There are no diabetic complications. There are no known risk factors for coronary artery disease. Current diabetic treatment includes oral agent (monotherapy). He is compliant with treatment all of the time.  Chest Pain  This is a new problem. The current episode started more than 1 month ago. The onset quality is sudden. The problem occurs intermittently. The problem has been unchanged. The pain is moderate. The quality of the pain is described as sharp. The pain does not radiate.   Results for orders placed in visit on 10/22/13  POCT GLYCOSYLATED HEMOGLOBIN (HGB A1C)      Result Value Range   Hemoglobin A1C 6.9     Watching fats in the diet.lopid twice per dy, take regularly. No obvious side effects from the medication.  Admits to not exercising very regularly.  Chest pain hit pretty hard way back in December. Some shortness of breath. No nausea no diaphoresis. Did not occur with exertion. Classic for several minutes. Has had a couple similar spells lately but less so.  Had a heart catheterization several years ago after a positive stress test. This revealed stenosis in an artery but not enough to intervene.   Ongoing chronic neck pain. See assessment.  Review of Systems  Cardiovascular: Positive for chest pain.   no back pain no abdominal pain no change about have some blood in stool ROS otherwise negative     Objective:   Physical Exam  Alert no apparent distress. HEENT normal. Neck supple. Lungs clear. Heart regular rate and rhythm. Abdomen benign. Chest wall nontender. Ankles without edema. See diabetic foot  exam.  EKG normal sinus rhythm no significant ST-T changes.    Assessment & Plan:  Impression 1 chest pain atypical but somewhat concerning in terms of prior coronary artery and anatomy and many risk factors. Discussed at great length. #2 hyperlipidemia suboptimum diet discussed in encourage. #3 hypertension good control. #4 type 2 diabetes good control. 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 Not judged a surgical candidate by the neurosurgeons. Plan diet exercise discussed. Maintain same medications. Cardiology consult rationale discussed. Easily 35 minutes spent to 40 minutes most in discussion of many concerns.

## 2013-10-30 ENCOUNTER — Other Ambulatory Visit: Payer: Self-pay | Admitting: Family Medicine

## 2013-11-11 ENCOUNTER — Encounter: Payer: Self-pay | Admitting: Family Medicine

## 2013-11-13 ENCOUNTER — Telehealth: Payer: Self-pay | Admitting: Family Medicine

## 2013-11-13 NOTE — Telephone Encounter (Signed)
No referral in system, please initiate cardiology referral so I may process

## 2013-11-13 NOTE — Telephone Encounter (Signed)
Patients spouse calling to check on status of referral to heart doctor.

## 2013-11-15 NOTE — Telephone Encounter (Signed)
Atyp c p see my note, i added addendum and did the ref

## 2013-11-15 NOTE — Addendum Note (Signed)
Addended by: Margaretmary Eddy on: 11/15/2013 05:44 PM   Modules accepted: Orders

## 2013-11-16 ENCOUNTER — Ambulatory Visit: Payer: Self-pay | Admitting: Cardiovascular Disease

## 2013-11-16 ENCOUNTER — Other Ambulatory Visit: Payer: Self-pay | Admitting: Family Medicine

## 2013-11-16 NOTE — Telephone Encounter (Signed)
Appt scheduled, pt aware

## 2013-11-28 ENCOUNTER — Other Ambulatory Visit (HOSPITAL_COMMUNITY): Payer: Self-pay | Admitting: Family Medicine

## 2013-12-07 ENCOUNTER — Encounter (INDEPENDENT_AMBULATORY_CARE_PROVIDER_SITE_OTHER): Payer: Self-pay

## 2013-12-07 ENCOUNTER — Encounter (HOSPITAL_COMMUNITY): Payer: Self-pay | Admitting: Pharmacy Technician

## 2013-12-07 ENCOUNTER — Ambulatory Visit (INDEPENDENT_AMBULATORY_CARE_PROVIDER_SITE_OTHER): Payer: PRIVATE HEALTH INSURANCE | Admitting: Cardiology

## 2013-12-07 ENCOUNTER — Encounter: Payer: Self-pay | Admitting: Cardiology

## 2013-12-07 VITALS — BP 112/77 | HR 99 | Ht 74.0 in | Wt 241.0 lb

## 2013-12-07 DIAGNOSIS — I10 Essential (primary) hypertension: Secondary | ICD-10-CM

## 2013-12-07 DIAGNOSIS — I200 Unstable angina: Secondary | ICD-10-CM

## 2013-12-07 DIAGNOSIS — I2510 Atherosclerotic heart disease of native coronary artery without angina pectoris: Secondary | ICD-10-CM

## 2013-12-07 DIAGNOSIS — E785 Hyperlipidemia, unspecified: Secondary | ICD-10-CM

## 2013-12-07 LAB — BASIC METABOLIC PANEL
BUN: 13 mg/dL (ref 6–23)
CO2: 21 mEq/L (ref 19–32)
Calcium: 10.1 mg/dL (ref 8.4–10.5)
Chloride: 99 mEq/L (ref 96–112)
Creat: 0.96 mg/dL (ref 0.50–1.35)
Glucose, Bld: 242 mg/dL — ABNORMAL HIGH (ref 70–99)
Potassium: 4.3 mEq/L (ref 3.5–5.3)
Sodium: 136 mEq/L (ref 135–145)

## 2013-12-07 LAB — PROTIME-INR
INR: 1.03 (ref <1.50)
Prothrombin Time: 13.4 seconds (ref 11.6–15.2)

## 2013-12-07 LAB — CBC WITH DIFFERENTIAL/PLATELET
Basophils Absolute: 0 10*3/uL (ref 0.0–0.1)
Basophils Relative: 0 % (ref 0–1)
Eosinophils Absolute: 0.1 10*3/uL (ref 0.0–0.7)
Eosinophils Relative: 2 % (ref 0–5)
HCT: 44.8 % (ref 39.0–52.0)
Hemoglobin: 15.9 g/dL (ref 13.0–17.0)
Lymphocytes Relative: 32 % (ref 12–46)
Lymphs Abs: 2.2 10*3/uL (ref 0.7–4.0)
MCH: 30.2 pg (ref 26.0–34.0)
MCHC: 35.5 g/dL (ref 30.0–36.0)
MCV: 85 fL (ref 78.0–100.0)
Monocytes Absolute: 0.5 10*3/uL (ref 0.1–1.0)
Monocytes Relative: 8 % (ref 3–12)
Neutro Abs: 3.9 10*3/uL (ref 1.7–7.7)
Neutrophils Relative %: 58 % (ref 43–77)
Platelets: 179 10*3/uL (ref 150–400)
RBC: 5.27 MIL/uL (ref 4.22–5.81)
RDW: 13.4 % (ref 11.5–15.5)
WBC: 6.8 10*3/uL (ref 4.0–10.5)

## 2013-12-07 LAB — APTT: aPTT: 29 seconds (ref 24–37)

## 2013-12-07 NOTE — Addendum Note (Signed)
Addended by: Barbarann Ehlers A on: 12/07/2013 09:31 AM   Modules accepted: Orders

## 2013-12-07 NOTE — Patient Instructions (Signed)
Your physician recommends that you schedule a follow-up appointment to be determined after heart cath   Your physician has requested that you have a cardiac catheterization with Dr.Varansi Cardiac catheterization is used to diagnose and/or treat various heart conditions. Doctors may recommend this procedure for a number of different reasons. The most common reason is to evaluate chest pain. Chest pain can be a symptom of coronary artery disease (CAD), and cardiac catheterization can show whether plaque is narrowing or blocking your heart's arteries. This procedure is also used to evaluate the valves, as well as measure the blood flow and oxygen levels in different parts of your heart. For further information please visit [email or web address]. Please follow instruction sheet, as given.   Your physician recommends that you continue on your current medications as directed. Please refer to the Current Medication list given to you today.   Thank you for choosing [facility] !

## 2013-12-07 NOTE — Assessment & Plan Note (Signed)
Symptoms as outlined above. Main question is whether he has had progressive, obstructive CAD since prior assessment 2011. Recent ECG nonspecific. He has had worsening symptoms in the last few weeks. Previous noninvasive testing has been both normal and abnormal depending on modality and timing. Plan is to pursue a diagnostic cardiac catheterization to best evaluate his coronary anatomy and determine if there any revascularization options to be considered. He is in agreement to proceed. Procedure is being scheduled with Dr. Irish Lack on Wednesday, March 25.

## 2013-12-07 NOTE — Progress Notes (Signed)
Clinical Summary Jared Griffin is a 53 y.o.male [employer] referred for cardiology consultation by Dr. Wolfgang Phoenix. He is here with his wife today. He reports a two to three-week history of recurring exertional chest discomfort described as a "sharp" pain in his chest, also fatigue and exertional shortness of breath. He has had some episodes of chest discomfort that woke him up at night as well, took medicines for indigestion with these events. He has been under stress at work due to changes in the department. Also had a friend suffer a massive myocardial infarction recently at similar age.  He underwent cardiac catheterization back in March 2011 demonstrated calcification within the proximal LAD and a 40% mid LAD stenosis - managed medically . Exercise echocardiogram from April 2011 was negative for ischemia. Recent ECG reviewed showing normal sinus rhythm.  Lab work from January of this year showed cholesterol 178, triglycerides 411, HDL 30, LDL was not calculated (previously in the 80-90 range).  He has a prior history of tobacco use. Reports compliance with his medications, trying to work on his diet and weight loss.   No Known Allergies  Current Outpatient Prescriptions  Medication Sig Dispense Refill  . acetaminophen (TYLENOL) 500 MG tablet Take 1,000 mg by mouth every 6 (six) hours as needed. Pain.      Marland Kitchen aspirin (ASPIR-LOW) 81 MG EC tablet Take 162 mg by mouth daily.       . enalapril (VASOTEC) 10 MG tablet TAKE ONE (1) TABLET AT BEDTIME  30 tablet  5  . fish oil-omega-3 fatty acids 1000 MG capsule Take 2 g by mouth daily.      Marland Kitchen gemfibrozil (LOPID) 600 MG tablet TAKE ONE TABLET BY MOUTH TWICE A DAY  60 tablet  5  . GLIPIZIDE XL 2.5 MG 24 hr tablet TAKE ONE TABLET BY MOUTH EVERY MORNING  90 tablet  1  . HYDROcodone-acetaminophen (NORCO) 7.5-325 MG per tablet Take 1 tablet by mouth at bedtime as needed for moderate pain.  30 tablet  0  . metFORMIN (GLUCOPHAGE) 500 MG tablet TAKE TWO  TABLETS EVERY MORNING, ONE TABLET AT NOON, AND TWO TABLETS EVERY EVENING  450 tablet  1   No current facility-administered medications for this visit.    Past Medical History  Diagnosis Date  . Type 2 diabetes mellitus   . History of abdominal pain   . Essential hypertension, benign   . Mixed hyperlipidemia 2006  . 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 Arthritis   . Fatty liver disease, nonalcoholic   . Coronary atherosclerosis of native coronary artery     Calcification the proximal LAD with 40% mid LAD stenosis - March 2011    Past Surgical History  Procedure Laterality Date  . Liver biopsy    . Cholecystectomy  2003  . Knee surgery  1999  . Colonoscopy  06/19/2012    Procedure: COLONOSCOPY;  Surgeon: [name], MD;  Location: [facility];  Service: Endoscopy;  Laterality: N/A;  930    History reviewed. No pertinent family history.  Social History Mr. Ellingson reports that he quit smoking about 30 years ago. His smoking use included Cigarettes. He has a 5 pack-year smoking history. He does not have any smokeless tobacco history on file. Mr. Ostergaard reports that he does not drink alcohol.  Review of Systems No palpitations, dizziness, syncope. No claudication. Does have Morton's neuromas on his feet that have been painful and have limited his ambulation.  Physical Examination Filed Vitals:   12/07/13 0845  BP: 112/77  Pulse: 99   Filed Weights   12/07/13 0845  Weight: 241 lb (109.317 kg)   Overweight male, no distress. HEENT: Conjunctiva and lids normal, oropharynx clear with moist mucosa. Neck: Supple, no elevated JVP or carotid bruits, no thyromegaly. Lungs: Clear to auscultation, nonlabored breathing at rest. Cardiac: Regular rate and rhythm, no S3 or significant systolic murmur, no pericardial rub. Abdomen: Soft, nontender, bowel sounds present, no guarding or rebound. Extremities: No pitting edema, distal pulses 2+. Skin: Warm and  dry. Musculoskeletal: No kyphosis. Neuropsychiatric: Alert and oriented x3, affect grossly appropriate.   Problem List and Plan   Accelerating angina Symptoms as outlined above. Main question is whether he has had progressive, obstructive CAD since prior assessment 2011. Recent ECG nonspecific. He has had worsening symptoms in the last few weeks. Previous noninvasive testing has been both normal and abnormal depending on modality and timing. Plan is to pursue a diagnostic cardiac catheterization to best evaluate his coronary anatomy and determine if there any revascularization options to be considered. He is in agreement to proceed. Procedure is being scheduled with Dr. Irish Lack on Wednesday, March 25.  CAD, NATIVE VESSEL Calcified proximal LAD with 40% mid LAD stenosis as of 2011.  Essential hypertension, benign Blood pressure to normal today. He is on Vasotec.  HYPERLIPIDEMIA-MIXED Currently on Lopid. Triglycerides were elevated in January. Prior LDL in the 80-90 range. Optimal candidate for statin therapy with diabetes and atherosclerotic disease.    Satira Sark, M.D., F.A.C.C.

## 2013-12-07 NOTE — Assessment & Plan Note (Signed)
Calcified proximal LAD with 40% mid LAD stenosis as of 2011.

## 2013-12-07 NOTE — Assessment & Plan Note (Signed)
Currently on Lopid. Triglycerides were elevated in January. Prior LDL in the 80-90 range. Optimal candidate for statin therapy with diabetes and atherosclerotic disease.

## 2013-12-07 NOTE — Assessment & Plan Note (Signed)
Blood pressure to normal today. He is on Vasotec.

## 2013-12-09 ENCOUNTER — Encounter (HOSPITAL_COMMUNITY)
Admission: RE | Disposition: A | Discharge status: Home / Self Care | Payer: Self-pay | Source: Ambulatory Visit | Attending: Interventional Cardiology

## 2013-12-09 ENCOUNTER — Ambulatory Visit (HOSPITAL_COMMUNITY)
Admission: RE | Admit: 2013-12-09 | Discharge: 2013-12-10 | Disposition: A | Discharge status: Home / Self Care | Payer: PRIVATE HEALTH INSURANCE | Source: Ambulatory Visit | Attending: Interventional Cardiology | Admitting: Interventional Cardiology

## 2013-12-09 ENCOUNTER — Encounter (HOSPITAL_COMMUNITY): Payer: Self-pay | Admitting: General Practice

## 2013-12-09 DIAGNOSIS — I10 Essential (primary) hypertension: Secondary | ICD-10-CM | POA: Insufficient documentation

## 2013-12-09 DIAGNOSIS — I200 Unstable angina: Secondary | ICD-10-CM

## 2013-12-09 DIAGNOSIS — I2510 Atherosclerotic heart disease of native coronary artery without angina pectoris: Secondary | ICD-10-CM | POA: Insufficient documentation

## 2013-12-09 DIAGNOSIS — Z6831 Body mass index (BMI) 31.0-31.9, adult: Secondary | ICD-10-CM | POA: Insufficient documentation

## 2013-12-09 DIAGNOSIS — E669 Obesity, unspecified: Secondary | ICD-10-CM | POA: Insufficient documentation

## 2013-12-09 DIAGNOSIS — K7460 Unspecified cirrhosis of liver: Secondary | ICD-10-CM | POA: Insufficient documentation

## 2013-12-09 DIAGNOSIS — E1159 Type 2 diabetes mellitus with other circulatory complications: Secondary | ICD-10-CM | POA: Diagnosis present

## 2013-12-09 DIAGNOSIS — K7689 Other specified diseases of liver: Secondary | ICD-10-CM | POA: Insufficient documentation

## 2013-12-09 DIAGNOSIS — E782 Mixed hyperlipidemia: Secondary | ICD-10-CM | POA: Insufficient documentation

## 2013-12-09 DIAGNOSIS — Z955 Presence of coronary angioplasty implant and graft: Secondary | ICD-10-CM

## 2013-12-09 DIAGNOSIS — Z7982 Long term (current) use of aspirin: Secondary | ICD-10-CM | POA: Insufficient documentation

## 2013-12-09 DIAGNOSIS — E119 Type 2 diabetes mellitus without complications: Secondary | ICD-10-CM | POA: Insufficient documentation

## 2013-12-09 DIAGNOSIS — Z87891 Personal history of nicotine dependence: Secondary | ICD-10-CM | POA: Insufficient documentation

## 2013-12-09 DIAGNOSIS — K760 Fatty (change of) liver, not elsewhere classified: Secondary | ICD-10-CM | POA: Diagnosis present

## 2013-12-09 DIAGNOSIS — Z9861 Coronary angioplasty status: Secondary | ICD-10-CM

## 2013-12-09 HISTORY — PX: LEFT HEART CATHETERIZATION WITH CORONARY ANGIOGRAM: SHX5451

## 2013-12-09 HISTORY — DX: Obesity, unspecified: E66.9

## 2013-12-09 HISTORY — DX: Juvenile osteochondrosis of tibia and fibula, right leg: M92.51

## 2013-12-09 HISTORY — DX: Juvenile osteochondrosis of tibia tubercle, right leg: M92.521

## 2013-12-09 LAB — POCT ACTIVATED CLOTTING TIME
Activated Clotting Time: 215 seconds
Activated Clotting Time: 221 seconds
Activated Clotting Time: 238 seconds
Activated Clotting Time: 298 seconds

## 2013-12-09 LAB — GLUCOSE, CAPILLARY
Glucose-Capillary: 179 mg/dL — ABNORMAL HIGH (ref 70–99)
Glucose-Capillary: 137 mg/dL — ABNORMAL HIGH (ref 70–99)
Glucose-Capillary: 107 mg/dL — ABNORMAL HIGH (ref 70–99)
Glucose-Capillary: 132 mg/dL — ABNORMAL HIGH (ref 70–99)
Glucose-Capillary: 184 mg/dL — ABNORMAL HIGH (ref 70–99)

## 2013-12-09 LAB — PLATELET COUNT: Platelets: 152 10*3/uL (ref 150–400)

## 2013-12-09 SURGERY — LEFT HEART CATHETERIZATION WITH CORONARY ANGIOGRAM
Anesthesia: LOCAL

## 2013-12-09 MED ORDER — SODIUM CHLORIDE 0.9 % IV SOLN: 1.0000 mL/kg/h | INTRAVENOUS | Status: AC

## 2013-12-09 MED ORDER — HYDROCODONE-ACETAMINOPHEN 5-325 MG PO TABS: 1.0000 | ORAL_TABLET | ORAL | Status: DC | PRN

## 2013-12-09 MED ORDER — ATORVASTATIN CALCIUM 80 MG PO TABS
80.0000 mg | ORAL_TABLET | ORAL | Status: DC
  Filled 2013-12-09: qty 1

## 2013-12-09 MED ORDER — TIROFIBAN HCL IV 5 MG/100ML
0.1500 ug/kg/min | INTRAVENOUS | Status: AC
  Filled 2013-12-09 (×2): qty 100

## 2013-12-09 MED ORDER — MIDAZOLAM HCL 2 MG/2ML IJ SOLN
INTRAMUSCULAR | Status: AC
  Filled 2013-12-09: qty 2

## 2013-12-09 MED ORDER — ASPIRIN 81 MG PO CHEW
81.0000 mg | CHEWABLE_TABLET | Freq: Every day | ORAL | Status: DC
  Filled 2013-12-09: qty 1

## 2013-12-09 MED ORDER — ENALAPRIL MALEATE 10 MG PO TABS
10.0000 mg | ORAL_TABLET | Freq: Every day | ORAL | Status: DC
  Administered 2013-12-09: 10 mg via ORAL
  Filled 2013-12-09 (×2): qty 1

## 2013-12-09 MED ORDER — FAMOTIDINE 10 MG PO TABS
10.0000 mg | ORAL_TABLET | Freq: Every day | ORAL | Status: DC
  Administered 2013-12-09: 20:00:00 10 mg via ORAL
  Filled 2013-12-09 (×2): qty 1

## 2013-12-09 MED ORDER — LIVING WELL WITH DIABETES BOOK
Freq: Once | Status: AC
  Administered 2013-12-09: 23:00:00
  Filled 2013-12-09: qty 1

## 2013-12-09 MED ORDER — ASPIRIN 81 MG PO TBEC
81.0000 mg | DELAYED_RELEASE_TABLET | Freq: Every day | ORAL | Status: DC
  Administered 2013-12-10: 81 mg via ORAL
  Filled 2013-12-09 (×2): qty 1

## 2013-12-09 MED ORDER — MORPHINE SULFATE 2 MG/ML IJ SOLN
2.0000 mg | INTRAMUSCULAR | Status: DC | PRN
  Administered 2013-12-09: 20:00:00 2 mg via INTRAVENOUS
  Filled 2013-12-09: qty 1

## 2013-12-09 MED ORDER — ASPIRIN 81 MG PO CHEW
CHEWABLE_TABLET | ORAL | Status: AC
  Administered 2013-12-09: 10:00:00
  Filled 2013-12-09: qty 1

## 2013-12-09 MED ORDER — CLOPIDOGREL BISULFATE 300 MG PO TABS
ORAL_TABLET | ORAL | Status: AC
  Filled 2013-12-09: qty 1

## 2013-12-09 MED ORDER — GEMFIBROZIL 600 MG PO TABS
600.0000 mg | ORAL_TABLET | Freq: Two times a day (BID) | ORAL | Status: DC
  Administered 2013-12-09 – 2013-12-10 (×2): 600 mg via ORAL
  Filled 2013-12-09 (×5): qty 1

## 2013-12-09 MED ORDER — HEPARIN SODIUM (PORCINE) 1000 UNIT/ML IJ SOLN
INTRAMUSCULAR | Status: AC
  Filled 2013-12-09: qty 1

## 2013-12-09 MED ORDER — SODIUM CHLORIDE 0.9 % IJ SOLN: 3.0000 mL | INTRAMUSCULAR | Status: DC | PRN

## 2013-12-09 MED ORDER — HYDROCODONE-ACETAMINOPHEN 7.5-325 MG PO TABS
1.0000 | ORAL_TABLET | ORAL | Status: DC | PRN
  Administered 2013-12-09 – 2013-12-10 (×2): 1 via ORAL
  Filled 2013-12-09 (×2): qty 1

## 2013-12-09 MED ORDER — GABAPENTIN 300 MG PO CAPS
300.0000 mg | ORAL_CAPSULE | Freq: Three times a day (TID) | ORAL | Status: DC
  Administered 2013-12-09 – 2013-12-10 (×2): 300 mg via ORAL
  Filled 2013-12-09 (×4): qty 1

## 2013-12-09 MED ORDER — ATORVASTATIN CALCIUM 80 MG PO TABS: 80.0000 mg | ORAL_TABLET | Freq: Every day | ORAL | Status: DC

## 2013-12-09 MED ORDER — HEPARIN (PORCINE) IN NACL 2-0.9 UNIT/ML-% IJ SOLN
INTRAMUSCULAR | Status: AC
  Filled 2013-12-09: qty 1500

## 2013-12-09 MED ORDER — ADENOSINE 12 MG/4ML IV SOLN
16.0000 mL | Freq: Once | INTRAVENOUS | Status: AC
  Administered 2013-12-09: 48 mg via INTRAVENOUS
  Filled 2013-12-09: qty 16

## 2013-12-09 MED ORDER — LIDOCAINE HCL (PF) 1 % IJ SOLN
INTRAMUSCULAR | Status: AC
  Filled 2013-12-09: qty 30

## 2013-12-09 MED ORDER — GLIPIZIDE ER 2.5 MG PO TB24
2.5000 mg | ORAL_TABLET | Freq: Every day | ORAL | Status: DC
  Administered 2013-12-10: 08:00:00 2.5 mg via ORAL
  Filled 2013-12-09 (×2): qty 1

## 2013-12-09 MED ORDER — FENTANYL CITRATE 0.05 MG/ML IJ SOLN
INTRAMUSCULAR | Status: AC
  Filled 2013-12-09: qty 2

## 2013-12-09 MED ORDER — SODIUM CHLORIDE 0.9 % IJ SOLN: 3.0000 mL | Freq: Two times a day (BID) | INTRAMUSCULAR | Status: DC

## 2013-12-09 MED ORDER — METFORMIN HCL 500 MG PO TABS: 500.0000 mg | ORAL_TABLET | Freq: Three times a day (TID) | ORAL | Status: DC

## 2013-12-09 MED ORDER — VERAPAMIL HCL 2.5 MG/ML IV SOLN
INTRAVENOUS | Status: AC
  Filled 2013-12-09: qty 2

## 2013-12-09 MED ORDER — SODIUM CHLORIDE 0.9 % IV SOLN: 250.0000 mL | INTRAVENOUS | Status: DC | PRN

## 2013-12-09 MED ORDER — NITROGLYCERIN 0.2 MG/ML ON CALL CATH LAB
INTRAVENOUS | Status: AC
  Filled 2013-12-09: qty 1

## 2013-12-09 MED ORDER — CLOPIDOGREL BISULFATE 75 MG PO TABS
75.0000 mg | ORAL_TABLET | Freq: Every day | ORAL | Status: DC
  Administered 2013-12-10: 75 mg via ORAL
  Filled 2013-12-09: qty 1

## 2013-12-09 MED ORDER — ASPIRIN 81 MG PO CHEW: 81.0000 mg | CHEWABLE_TABLET | ORAL | Status: DC

## 2013-12-09 MED ORDER — ACETAMINOPHEN 500 MG PO TABS
1000.0000 mg | ORAL_TABLET | Freq: Every day | ORAL | Status: DC | PRN
  Administered 2013-12-10: 08:00:00 1000 mg via ORAL
  Filled 2013-12-09: qty 2

## 2013-12-09 MED ORDER — SODIUM CHLORIDE 0.9 % IV SOLN
INTRAVENOUS | Status: DC
  Administered 2013-12-09: 10:00:00 via INTRAVENOUS

## 2013-12-09 MED ORDER — TIROFIBAN HCL IV 5 MG/100ML
INTRAVENOUS | Status: AC
  Filled 2013-12-09: qty 100

## 2013-12-09 NOTE — H&P (View-Only) (Signed)
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 Wolfgang Phoenix. He is here with his wife today. He reports a two to three-week history of recurring exertional chest discomfort described as a "sharp" pain in his chest, also fatigue and exertional shortness of breath. He has had some episodes of chest discomfort that woke him up at night as well, took medicines for indigestion with these events. He has been under stress at work due to changes in the department. Also had a friend suffer a massive myocardial infarction recently at similar age.  He underwent cardiac catheterization back in March 2011 demonstrated calcification within the proximal LAD and a 40% mid LAD stenosis - managed medically . Exercise echocardiogram from April 2011 was negative for ischemia. Recent ECG reviewed showing normal sinus rhythm.  Lab work from January of this year showed cholesterol 178, triglycerides 411, HDL 30, LDL was not calculated (previously in the 80-90 range).  He has a prior history of tobacco use. Reports compliance with his medications, trying to work on his diet and weight loss.   No Known Allergies  Current Outpatient Prescriptions  Medication Sig Dispense Refill  . acetaminophen (TYLENOL) 500 MG tablet Take 1,000 mg by mouth every 6 (six) hours as needed. Pain.      Marland Kitchen aspirin (ASPIR-LOW) 81 MG EC tablet Take 162 mg by mouth daily.       . enalapril (VASOTEC) 10 MG tablet TAKE ONE (1) TABLET AT BEDTIME  30 tablet  5  . fish oil-omega-3 fatty acids 1000 MG capsule Take 2 g by mouth daily.      Marland Kitchen gemfibrozil (LOPID) 600 MG tablet TAKE ONE TABLET BY MOUTH TWICE A DAY  60 tablet  5  . GLIPIZIDE XL 2.5 MG 24 hr tablet TAKE ONE TABLET BY MOUTH EVERY MORNING  90 tablet  1  . HYDROcodone-acetaminophen (NORCO) 7.5-325 MG per tablet Take 1 tablet by mouth at bedtime as needed for moderate pain.  30 tablet  0  . metFORMIN (GLUCOPHAGE) 500 MG tablet TAKE TWO  TABLETS EVERY MORNING, ONE TABLET AT NOON, AND TWO TABLETS EVERY EVENING  450 tablet  1   No current facility-administered medications for this visit.    Past Medical History  Diagnosis Date  . Type 2 diabetes mellitus   . History of abdominal pain   . Essential hypertension, benign   . Mixed hyperlipidemia 2006  . 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 Arthritis   . Fatty liver disease, nonalcoholic   . Coronary atherosclerosis of native coronary artery     Calcification the proximal LAD with 40% mid LAD stenosis - March 2011    Past Surgical History  Procedure Laterality Date  . Liver biopsy    . Cholecystectomy  2003  . Knee surgery  1999  . Colonoscopy  06/19/2012    Procedure: COLONOSCOPY;  Surgeon: [name], MD;  Location: [facility];  Service: Endoscopy;  Laterality: N/A;  930    History reviewed. No pertinent family history.  Social History Mr. Fettig reports that he quit smoking about 30 years ago. His smoking use included Cigarettes. He has a 5 pack-year smoking history. He does not have any smokeless tobacco history on file. Mr. Stupka reports that he does not drink alcohol.  Review of Systems No palpitations, dizziness, syncope. No claudication. Does have Morton's neuromas on his feet that have been painful and have limited his ambulation.  Physical Examination Filed Vitals:   12/07/13 0845  BP: 112/77  Pulse: 99   Filed Weights   12/07/13 0845  Weight: 241 lb (109.317 kg)   Overweight male, no distress. HEENT: Conjunctiva and lids normal, oropharynx clear with moist mucosa. Neck: Supple, no elevated JVP or carotid bruits, no thyromegaly. Lungs: Clear to auscultation, nonlabored breathing at rest. Cardiac: Regular rate and rhythm, no S3 or significant systolic murmur, no pericardial rub. Abdomen: Soft, nontender, bowel sounds present, no guarding or rebound. Extremities: No pitting edema, distal pulses 2+. Skin: Warm and  dry. Musculoskeletal: No kyphosis. Neuropsychiatric: Alert and oriented x3, affect grossly appropriate.   Problem List and Plan   Accelerating angina Symptoms as outlined above. Main question is whether he has had progressive, obstructive CAD since prior assessment 2011. Recent ECG nonspecific. He has had worsening symptoms in the last few weeks. Previous noninvasive testing has been both normal and abnormal depending on modality and timing. Plan is to pursue a diagnostic cardiac catheterization to best evaluate his coronary anatomy and determine if there any revascularization options to be considered. He is in agreement to proceed. Procedure is being scheduled with Dr. Irish Lack on Wednesday, March 25.  CAD, NATIVE VESSEL Calcified proximal LAD with 40% mid LAD stenosis as of 2011.  Essential hypertension, benign Blood pressure to normal today. He is on Vasotec.  HYPERLIPIDEMIA-MIXED Currently on Lopid. Triglycerides were elevated in January. Prior LDL in the 80-90 range. Optimal candidate for statin therapy with diabetes and atherosclerotic disease.    Satira Sark, M.D., F.A.C.C.

## 2013-12-09 NOTE — Interval H&P Note (Signed)
Cath Lab Visit (complete for each Cath Lab visit)  Clinical Evaluation Leading to the Procedure:   ACS: no  Non-ACS:    Anginal Classification: CCS IV  Anti-ischemic medical therapy: No Therapy  Non-Invasive Test Results: No non-invasive testing performed  Prior CABG: No previous CABG      History and Physical Interval Note:  12/09/2013 1:29 PM  Jared Griffin  has presented today for surgery, with the diagnosis of angina  The various methods of treatment have been discussed with the patient and family. After consideration of risks, benefits and other options for treatment, the patient has consented to  Procedure(s): LEFT HEART CATHETERIZATION WITH CORONARY ANGIOGRAM (N/A) as a surgical intervention .  The patient's history has been reviewed, patient examined, no change in status, stable for surgery.  I have reviewed the patient's chart and labs.  Questions were answered to the patient's satisfaction.     Shania Bjelland S.

## 2013-12-09 NOTE — CV Procedure (Addendum)
       PROCEDURE:  Left heart catheterization with selective coronary angiography, left ventriculogram.  FFR LAD; PCI LAD  INDICATIONS:  Accelerating angina  The risks, benefits, and details of the procedure were explained to the patient.  The patient verbalized understanding and wanted to proceed.  Informed written consent was obtained.  PROCEDURE TECHNIQUE:  After Xylocaine anesthesia a 6F slender sheath was placed in the right radial artery with a single anterior needle wall stick.   IV heparin was given. Right coronary angiography was done using a Judkins R4 guide catheter.  Left coronary angiography was done using a Judkins L4 guide catheter.  Left ventriculography was done using a pigtail catheter. The intervention was performed. A TR band was used for hemostasis.   CONTRAST:  Total of 200 cc.  COMPLICATIONS:  None.    HEMODYNAMICS:  Aortic pressure was 95/66; LV pressure was 95/0; LVEDP 4.  There was no gradient between the left ventricle and aorta.    ANGIOGRAPHIC DATA:   The left main coronary artery is widely patent.  The left anterior descending artery is a large vessel which wraps around the apex.  There is a large first diagonal which is patent. In the mid vessel, there is a diffuse, 60-70% area stenosis. There is a medium size second diagonal which is patent. Just beyond the diagonal, there is mild atherosclerosis in the LAD. The mid to distal LAD is widely patent.  The left circumflex artery is a large vessel proximally. There is a large first OM which branches across the lateral wall. The remainder of the circumflex is medium size.  The right coronary artery is a large, dominant vessel which appears in grafting normal. The posterior descending artery is large and widely patent. Posterior lateral artery is medium-sized and patent.  LEFT VENTRICULOGRAM:  Left ventricular angiogram was done in the 30 RAO projection and revealed normal left ventricular wall motion and systolic  function with an estimated ejection fraction of 55%.  LVEDP was 6 mmHg.  PCI NARRATIVE: A JL4 guiding catheter was used to engage the left main. CLS 3.5 and JL 5.0 were unsuccessful. Additional heparin was given. ACT was used to check that the heparin was therapeutic. Resting FFR was 0.90. After adenosine, the FFR dropped to 0.71 after about 1.5 minutes.  IV tirofiban was started. A 2.5 x 15 balloon was used to predilate the lesion. A 3.0 x 28 promus drug-eluting stent was deployed. The stent was post dilated with a 3.5 x 15 noncompliant balloon. Several doses of nitroglycerin were administered. The FFR was checked at rest after the stent. It was 0.96.    IMPRESSIONS:  1. Normal left main coronary artery. 2. Moderate to severe stenosis in the mid left anterior descending artery  Which was significant by FFR. This was successfully stented with a 3.0 x 28 Promus drug-eluting stent, postdilated to 3.6 mm in diameter.  Of note, the JL4 guiding catheter fit well from the right radial approach.  3. Normal left circumflex artery and its branches. 4. Normal right coronary artery. 5. Normal left ventricular systolic function.  LVEDP  6 mmHg.  Ejection fraction 55 %.  RECOMMENDATION:   Continue dual antiplatelet therapy for at least a year. He will need aggressive secondary prevention including diabetes control. He'll be watched overnight. Plan for discharge tomorrow morning assuming no complications.

## 2013-12-09 NOTE — Progress Notes (Signed)
Called Dr. Irish Lack to verify how long to run the Aggrastat which he wants to run for 6 hours per cath report it was started at 2:49 pm and I will plan to stop it at 8:49pm.

## 2013-12-09 NOTE — Progress Notes (Signed)
Patient has TR band post cath and patient developed hematoma level 2 I held pressure for 20 min and reduced down to level 1 maintained normal pulse, capillary refills and hand warm to touch. I applied dressing  to help maintain site. I assessed site several times by removing dressing to look under. Patient c/o pain I gave prn med then called Ronnald Collum. To ask for IV pain meds to help control pain. Night RN Roselle given report and assessed site and will continue manage the band needs to be removed now that all the air has beed removed. RN will monitor. Patient instructed to call RN if any changes with hand.

## 2013-12-10 ENCOUNTER — Telehealth: Payer: Self-pay | Admitting: *Deleted

## 2013-12-10 ENCOUNTER — Encounter (HOSPITAL_COMMUNITY): Payer: Self-pay | Admitting: Physician Assistant

## 2013-12-10 DIAGNOSIS — E1159 Type 2 diabetes mellitus with other circulatory complications: Secondary | ICD-10-CM | POA: Diagnosis present

## 2013-12-10 DIAGNOSIS — E782 Mixed hyperlipidemia: Secondary | ICD-10-CM | POA: Diagnosis present

## 2013-12-10 DIAGNOSIS — I200 Unstable angina: Secondary | ICD-10-CM

## 2013-12-10 DIAGNOSIS — K7460 Unspecified cirrhosis of liver: Secondary | ICD-10-CM | POA: Insufficient documentation

## 2013-12-10 DIAGNOSIS — K760 Fatty (change of) liver, not elsewhere classified: Secondary | ICD-10-CM | POA: Diagnosis present

## 2013-12-10 DIAGNOSIS — E669 Obesity, unspecified: Secondary | ICD-10-CM | POA: Diagnosis present

## 2013-12-10 LAB — CBC
HCT: 41.6 % (ref 39.0–52.0)
Hemoglobin: 14.9 g/dL (ref 13.0–17.0)
MCH: 30.8 pg (ref 26.0–34.0)
MCHC: 35.8 g/dL (ref 30.0–36.0)
MCV: 86 fL (ref 78.0–100.0)
Platelets: 161 10*3/uL (ref 150–400)
RBC: 4.84 MIL/uL (ref 4.22–5.81)
RDW: 13.2 % (ref 11.5–15.5)
WBC: 7.2 10*3/uL (ref 4.0–10.5)

## 2013-12-10 LAB — BASIC METABOLIC PANEL
BUN: 12 mg/dL (ref 6–23)
CO2: 25 mEq/L (ref 19–32)
Calcium: 9.3 mg/dL (ref 8.4–10.5)
Chloride: 98 mEq/L (ref 96–112)
Creatinine, Ser: 0.81 mg/dL (ref 0.50–1.35)
GFR calc Af Amer: >90 mL/min (ref >90)
GFR calc non Af Amer: >90 mL/min (ref >90)
Glucose, Bld: 198 mg/dL — ABNORMAL HIGH (ref 70–99)
Potassium: 4.1 mEq/L (ref 3.7–5.3)
Sodium: 138 mEq/L (ref 137–147)

## 2013-12-10 LAB — GLUCOSE, CAPILLARY: Glucose-Capillary: 184 mg/dL — ABNORMAL HIGH (ref 70–99)

## 2013-12-10 MED ORDER — CLOPIDOGREL BISULFATE 75 MG PO TABS: 75.0000 mg | ORAL_TABLET | Freq: Every day | ORAL | Status: DC

## 2013-12-10 NOTE — Discharge Summary (Signed)
Discharge Summary   Patient ID: VEDH PTACEK MRN: [id number], DOB/AGE: 1960/09/30 53 y.o. Admit date: 12/09/2013 D/C date:     12/10/2013  Primary Cardiologist: Dr. Irish Lack  Active Problems:   Accelerating angina   Essential hypertension, benign   Coronary artery disease   Type 2 diabetes mellitus   Mixed hyperlipidemia   Fatty liver disease, nonalcoholic   Obesity Body mass index is 31.07 kg/(m^2).   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   Hospital Course: He underwent cardiac catheterization which revealed normal left main coronary artery, normal LCx and its branches, normal RCA, normal left ventricular systolic function: LVEDP 6 mmHg. EF 55 %. There was  moderate to severe stenosis in the mLAD which was significant by FFR. This was successfully stented with a 3.0 x 28 Promus drug-eluting stent, postdilated to 3.6 mm in diameter. He was put on DAPT with ASA/Plavix, which he will continue for at least a year. He will need aggressive secondary prevention including diabetes control and weight loss.   The patient has had an uncomplicated hospital course and is recovering well. The radial catheter site is stable. The patient has been seen by Dr. Irish Lack today and deemed ready for discharge home. All follow-up appointments have been scheduled. Discharge medications include ASA/Plavix, enalapril, gemfibrozil. The addition of a BB can be discussed as an outpatient. He was told to hold his Metformin for 48 hours after his cardiac catheterization and a note to excuse him from work was provided for the patient.    Discharge Vitals: Blood pressure  116/76, pulse 83, temperature 97.9 F (36.6 C), temperature source Oral, resp. rate 18, height 6' 2"  (1.88 m), weight 242 lb 1 oz (109.8 kg), SpO2 95.00%.  Labs: Lab Results  Component Value Date   WBC 7.2 12/10/2013   HGB 14.9 12/10/2013   HCT 41.6 12/10/2013   MCV 86.0 12/10/2013   PLT 161 12/10/2013     Recent Labs Lab 12/10/13 0425  NA 138  K 4.1  CL 98  CO2 25  BUN 12  CREATININE 0.81  CALCIUM 9.3  GLUCOSE 198*    Lab Results  Component Value Date   CHOL 178 10/16/2013   HDL 30* 10/16/2013   LDLCALC Comment:   Not calculated due to Triglyceride >400. Suggest ordering Direct LDL (Unit Code: [phone]).   Total Cholesterol/HDL Ratio:CHD Risk                        Coronary Heart Disease Risk Table                                        Men       Women          1/2 Average Risk              3.4        3.3              Average Risk  5.0        4.4           2X Average Risk              9.6        7.1           3X Average Risk             23.4       11.0 Use the calculated Patient Ratio above and the CHD Risk table  to determine the patient's CHD Risk. ATP III Classification (LDL):       < 100        mg/dL         Optimal      100 - 129     mg/dL         Near or Above Optimal      130 - 159     mg/dL         Borderline High      160 - 189     mg/dL         High       > 190        mg/dL         Very High   10/16/2013   TRIG 411* 10/16/2013     Diagnostic Studies/Procedures   PROCEDURE: Left heart catheterization with selective coronary angiography, left ventriculogram. FFR LAD; PCI LAD  INDICATIONS: Accelerating angina  The risks, benefits, and details of the procedure were explained to the patient. The patient verbalized understanding and wanted to proceed. Informed written consent was obtained.  PROCEDURE TECHNIQUE: After Xylocaine anesthesia a 41F slender sheath was placed in the right radial artery with a single anterior needle wall stick. IV heparin was given. Right coronary  angiography was done using a Judkins R4 guide catheter. Left coronary angiography was done using a Judkins L4 guide catheter. Left ventriculography was done using a pigtail catheter. The intervention was performed. A TR band was used for hemostasis.  CONTRAST: Total of 200 cc.  COMPLICATIONS: None.  HEMODYNAMICS: Aortic pressure was 95/66; LV pressure was 95/0; LVEDP 4. There was no gradient between the left ventricle and aorta.  ANGIOGRAPHIC DATA: The left main coronary artery is widely patent.  The left anterior descending artery is a large vessel which wraps around the apex. There is a large first diagonal which is patent. In the mid vessel, there is a diffuse, 60-70% area stenosis. There is a medium size second diagonal which is patent. Just beyond the diagonal, there is mild atherosclerosis in the LAD. The mid to distal LAD is widely patent.  The left circumflex artery is a large vessel proximally. There is a large first OM which branches across the lateral wall. The remainder of the circumflex is medium size.  The right coronary artery is a large, dominant vessel which appears in grafting normal. The posterior descending artery is large and widely patent. Posterior lateral artery is medium-sized and patent.  LEFT VENTRICULOGRAM: Left ventricular angiogram was done in the 30 RAO projection and revealed normal left ventricular wall motion and systolic function with an estimated ejection fraction of 55%. LVEDP was 6 mmHg.  PCI NARRATIVE: A JL4 guiding catheter was used to engage the left main. CLS 3.5 and JL 5.0 were unsuccessful. Additional heparin was given. ACT was used to check that the heparin was therapeutic. Resting FFR was 0.90. After adenosine, the FFR dropped to 0.71 after about  1.5 minutes. IV tirofiban was started. A 2.5 x 15 balloon was used to predilate the lesion. A 3.0 x 28 promus drug-eluting stent was deployed. The stent was post dilated with a 3.5 x 15 noncompliant balloon. Several  doses of nitroglycerin were administered. The FFR was checked at rest after the stent. It was 0.96.  IMPRESSIONS:  1. Normal left main coronary artery. 2. Moderate to severe stenosis in the mid left anterior descending artery Which was significant by FFR. This was successfully stented with a 3.0 x 28 Promus drug-eluting stent, postdilated to 3.6 mm in diameter. Of note, the JL4 guiding catheter fit well from the right radial approach.  3. Normal left circumflex artery and its branches. 4. Normal right coronary artery. 5. Normal left ventricular systolic function. LVEDP 6 mmHg. Ejection fraction 55 %. RECOMMENDATION: Continue dual antiplatelet therapy for at least a year. He will need aggressive secondary prevention including diabetes control. He'll be watched overnight. Plan for discharge tomorrow morning assuming no complications   Discharge Medications     Medication List         acetaminophen 500 MG tablet  Commonly known as:  TYLENOL  Take 1,000 mg by mouth daily as needed for headache. Pain.     ASPIR-LOW 81 MG EC tablet  Generic drug:  aspirin  Take 81 mg by mouth daily.     clopidogrel 75 MG tablet  Commonly known as:  PLAVIX  Take 1 tablet (75 mg total) by mouth daily with breakfast.     enalapril 10 MG tablet  Commonly known as:  VASOTEC  Take 10 mg by mouth at bedtime.     gabapentin 300 MG capsule  Commonly known as:  NEURONTIN  Take 300 mg by mouth 3 (three) times daily.     gemfibrozil 600 MG tablet  Commonly known as:  LOPID  Take 600 mg by mouth 2 (two) times daily before a meal.     glipiZIDE 2.5 MG 24 hr tablet  Commonly known as:  GLUCOTROL XL  Take 2.5 mg by mouth daily with breakfast.     HYDROcodone-acetaminophen 5-325 MG per tablet  Commonly known as:  NORCO/VICODIN  Take 1 tablet by mouth every 4 (four) hours as needed for moderate pain.     HYDROcodone-acetaminophen 7.5-325 MG per tablet  Commonly known as:  NORCO  Take 1 tablet by mouth every  4 (four) hours as needed for moderate pain.     metFORMIN 500 MG tablet  Commonly known as:  GLUCOPHAGE  Take 500-1,000 mg by mouth 3 (three) times daily. Take 1000 mg by mouth in the morning, 500 mg at lunch, and 1000 mg at supper.     ranitidine 150 MG tablet  Commonly known as:  ZANTAC  Take 150 mg by mouth daily as needed for heartburn.        Disposition   The patient will be discharged in stable condition to home. Discharge Orders   Future Appointments Provider Department Dept Phone   12/25/2013 8:50 AM Liliane Shi, PA-C [place] [phone]   Future Orders Complete By Expires   Amb Referral to Cardiac Rehabilitation  As directed      Follow-up Information   Follow up with Richardson Dopp, PA-C On 12/25/2013. (@ 8:50)    Specialty:  Physician Assistant   Contact information:   [address] [place] [address] [phone]         Duration of Discharge  Encounter: 40 minutes including physician and PA time, going over the medicines and plan of care.  SignedVertell Limber, KATHRYN PA-C 12/10/2013, 11:23 AM   I have examined the patient and reviewed assessment and plan and discussed with patient.  Agree with above as stated.  DAPT for one year.  He would like to be seen in [place] rather than [place].  We will arrange f/u here.  Dietary control.  Encouraged cardiac rehab.  Makenzee Choudhry S.

## 2013-12-10 NOTE — Progress Notes (Signed)
CARDIAC REHAB PHASE I   PRE:  Rate/Rhythm: 92 SR  BP:  Supine:   Sitting: 116/76  Standing:    SaO2:   MODE:  Ambulation: 1000 ft   POST:  Rate/Rhythm: 99 SR  BP:  Supine:   Sitting: 144/87  Standing:    SaO2:  0800-0910 Pt tolerated ambulation well without c/o of cp or SOB. VS stable. Completed stent discharge education with pt and wife. He agrees to [facility]. CRP in [place], will send referral.  Rodney Langton RN 12/10/2013 9:07 AM

## 2013-12-10 NOTE — Progress Notes (Signed)
Pt seen and examined. OK for d/c later today on aspirin and plavix along with statin.  He wants to f/u with me in the [place] office.

## 2013-12-10 NOTE — Telephone Encounter (Signed)
Patients wife stated that he was supposed to get an rx for nitro sent in when he was d/c'd from the hospital. Is this ok? Please advise. Thanks, MI

## 2013-12-10 NOTE — Discharge Instructions (Signed)
DO NOT RESTART YOUR METFORMIN UNTIL 12/12/13 (saturday) morning.    Radial Site Care Refer to this sheet in the next few weeks. These instructions provide you with information on caring for yourself after your procedure. Your caregiver may also give you more specific instructions. Your treatment has been planned according to current medical practices, but problems sometimes occur. Call your caregiver if you have any problems or questions after your procedure. HOME CARE INSTRUCTIONS  You may shower the day after the procedure.Remove the bandage (dressing) and gently wash the site with plain soap and water.Gently pat the site dry.   Do not apply powder or lotion to the site.   Do not submerge the affected site in water for 3 to 5 days.   Inspect the site at least twice daily.   Do not flex or bend the affected arm for 24 hours.   No lifting over 5 pounds (2.3 kg) for 5 days after your procedure.   Do not drive home if you are discharged the same day of the procedure. Have someone else drive you.   You may drive 24 hours after the procedure unless otherwise instructed by your caregiver.  What to expect:  Any bruising will usually fade within 1 to 2 weeks.   Blood that collects in the tissue (hematoma) may be painful to the touch. It should usually decrease in size and tenderness within 1 to 2 weeks.  SEEK IMMEDIATE MEDICAL CARE IF:  You have unusual pain at the radial site.   You have redness, warmth, swelling, or pain at the radial site.   You have drainage (other than a small amount of blood on the dressing).   You have chills.   You have a fever or persistent symptoms for more than 72 hours.   You have a fever and your symptoms suddenly get worse.   Your arm becomes pale, cool, tingly, or numb.   You have heavy bleeding from the site. Hold pressure on the site.

## 2013-12-10 NOTE — Progress Notes (Signed)
TR BAND REMOVAL  LOCATION:    right radial  DEFLATED PER PROTOCOL:    yes  TIME BAND OFF / DRESSING APPLIED:    20:30   SITE UPON ARRIVAL:    Level 2  SITE AFTER BAND REMOVAL:    Level 1  REVERSE ALLEN'S TEST:     positive  CIRCULATION SENSATION AND MOVEMENT:    Within Normal Limits   yes  COMMENTS:   Bruise around the site

## 2013-12-11 ENCOUNTER — Other Ambulatory Visit: Payer: Self-pay | Admitting: *Deleted

## 2013-12-11 MED ORDER — NITROGLYCERIN 0.4 MG SL SUBL: 0.4000 mg | SUBLINGUAL_TABLET | SUBLINGUAL | Status: DC | PRN

## 2013-12-11 NOTE — Telephone Encounter (Signed)
Ok for NTG Rx

## 2013-12-11 NOTE — Telephone Encounter (Signed)
Rx sent in

## 2013-12-25 ENCOUNTER — Encounter: Payer: Self-pay | Admitting: Physician Assistant

## 2013-12-25 ENCOUNTER — Ambulatory Visit (INDEPENDENT_AMBULATORY_CARE_PROVIDER_SITE_OTHER): Payer: PRIVATE HEALTH INSURANCE | Admitting: Physician Assistant

## 2013-12-25 VITALS — BP 110/80 | HR 88 | Ht 74.0 in | Wt 238.0 lb

## 2013-12-25 DIAGNOSIS — K7689 Other specified diseases of liver: Secondary | ICD-10-CM

## 2013-12-25 DIAGNOSIS — Z79899 Other long term (current) drug therapy: Secondary | ICD-10-CM

## 2013-12-25 DIAGNOSIS — I10 Essential (primary) hypertension: Secondary | ICD-10-CM

## 2013-12-25 DIAGNOSIS — I2510 Atherosclerotic heart disease of native coronary artery without angina pectoris: Secondary | ICD-10-CM

## 2013-12-25 DIAGNOSIS — E785 Hyperlipidemia, unspecified: Secondary | ICD-10-CM

## 2013-12-25 DIAGNOSIS — K760 Fatty (change of) liver, not elsewhere classified: Secondary | ICD-10-CM

## 2013-12-25 MED ORDER — ATORVASTATIN CALCIUM 40 MG PO TABS: 40.0000 mg | ORAL_TABLET | Freq: Every day | ORAL | Status: DC

## 2013-12-25 NOTE — Patient Instructions (Signed)
STOP LOPID  START LIPITOR (ATORVASTATIN) 40 MG DAILY  Return in 2-3 weeks for labs only (hfp) ok to get in [place] recommends that you schedule a follow-up appointment in: 6-8 weeks for ov/fasting labs (hfp/lp) with Dr Irish Lack

## 2013-12-25 NOTE — Progress Notes (Signed)
[address] [place], [place]  [address] Phone: [phone] Fax:  [phone]  Date:  12/25/2013   ID:  Jared Griffin, DOB 06-05-1961, MRN [id number]  PCP:  Rubbie Battiest, MD  Cardiologist:  Dr. 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  He was evaluated by Dr. Rozann Lesches in [place] recently with exertional chest pain.  LHC was arranged and this demonstrated significant mid LAD disease that was treated with a DES.  He tolerated the procedure without complications.    Patient is doing well. He denies chest pain, shortness of breath, syncope, orthopnea, PND or edema. He does relate increased stress related to work.  Studies:  - LHC (12/09/13):  Mid LAD 60-70, EF 55%.  FFR of LAD 0.90 => 0.71.  PCI:  Promus (3 x 28 mm) DES to mid LAD.      Recent Labs: 05/07/2013: LDL (calc) 98  10/16/2013: ALT 32; HDL Cholesterol 30*  12/10/2013: Creatinine 0.81; Hemoglobin 14.9; Potassium 4.1   Wt Readings from Last 3 Encounters:  12/25/13 238 lb (107.956 kg)  12/10/13 242 lb 1 oz (109.8 kg)  12/10/13 242 lb 1 oz (109.8 kg)     Past Medical History  Diagnosis Date  . Essential hypertension, benign   . Mixed hyperlipidemia 2006  . 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 Arthritis   . Fatty liver disease, nonalcoholic   . Coronary atherosclerosis of native coronary artery     a. 12/09/2013: s/p PCI with 3.0 x 28 Promus DES to mLAD  . Osgood-Schlatter's disease of right knee   . Type 2 diabetes mellitus   . Obesity     Current Outpatient Prescriptions  Medication Sig Dispense Refill  . acetaminophen (TYLENOL) 500 MG tablet Take 1,000 mg by mouth daily as needed for headache. Pain.      Marland Kitchen aspirin (ASPIR-LOW) 81 MG EC tablet Take 81 mg by mouth daily.       . clopidogrel (PLAVIX) 75 MG tablet Take 1 tablet (75 mg total) by  mouth daily with breakfast.  30 tablet  11  . enalapril (VASOTEC) 10 MG tablet Take 10 mg by mouth at bedtime.      . gabapentin (NEURONTIN) 300 MG capsule Take 300 mg by mouth 3 (three) times daily.      Marland Kitchen gemfibrozil (LOPID) 600 MG tablet Take 600 mg by mouth 2 (two) times daily before a meal.      . glipiZIDE (GLUCOTROL XL) 2.5 MG 24 hr tablet Take 2.5 mg by mouth daily with breakfast.      . HYDROcodone-acetaminophen (NORCO) 7.5-325 MG per tablet Take 1 tablet by mouth every 4 (four) hours as needed for moderate pain.      . metFORMIN (GLUCOPHAGE) 500 MG tablet Take 500-1,000 mg by mouth 3 (three) times daily. Take 1000 mg by mouth in the morning, 500 mg at lunch, and 1000 mg at supper.      . nitroGLYCERIN (NITROSTAT) 0.4 MG SL tablet Place 1 tablet (0.4 mg total) under the tongue every 5 (five) minutes as needed for chest pain.  25 tablet  3  . ranitidine (ZANTAC) 150 MG tablet Take 150 mg by mouth daily as needed for heartburn.       No current facility-administered medications for this visit.  Allergies:   Review of patient's allergies indicates no known allergies.   Social History:  The patient  reports that he quit smoking about 30 years ago. His smoking use included Cigarettes. He has a 5 pack-year smoking history. He quit smokeless tobacco use about 30 years ago. His smokeless tobacco use included Chew. He reports that he does not drink alcohol or use illicit drugs.   Family History:  The patient's family history is not on file.   ROS:  Please see the history of present illness.      All other systems reviewed and negative.   PHYSICAL EXAM: VS:  BP 110/80  Pulse 88  Ht 6' 2"  (1.88 m)  Wt 238 lb (107.956 kg)  BMI 30.54 kg/m2 Well nourished, well developed, in no acute distress HEENT: normal Neck: no JVD Cardiac:  normal S1, S2; RRR; no murmur Lungs:  clear to auscultation bilaterally, no wheezing, rhonchi or rales Abd: soft, nontender, no hepatomegaly Ext: no edemaright  wrist without hematoma or mass  Skin: warm and dry Neuro:  CNs 2-12 intact, no focal abnormalities noted  EKG:  NSR, HR 88, normal axis, no ST changes     ASSESSMENT AND PLAN:  1. CAD: No angina. He is doing well after recent PCI to the LAD with a DES. He understands the importance of dual anti-platelet therapy. He is arranging cardiac rehabilitation in [place] at [facility]. I have encouraged him to continue to walk until that time. He is not on a statin. He is on gemfibrozil with a history of hypertriglyceridemia in the past. His history also indicates that he has fatty liver disease. I will stop his gemfibrozil. I will place him on Lipitor 40 mg daily. Check follow up lipid and LFTs in 6-8 weeks. 2. Hyperlipidemia: Change Lopid to Lipitor 40 as noted above. 3. Fatty liver disease: We will keep a close eye on his LFTs. Arrange follow up LFTs in 2-3 weeks. 4. Hypertension: Controlled. 5. Disposition:  F/u with Dr. Casandra Doffing in 6-8 weeks.   Signed, Richardson Dopp, PA-C  12/25/2013 9:05 AM

## 2014-01-08 LAB — HEPATIC FUNCTION PANEL
ALT: 38 U/L (ref 0–53)
AST: 28 U/L (ref 0–37)
Albumin: 4.3 g/dL (ref 3.5–5.2)
Alkaline Phosphatase: 33 U/L — ABNORMAL LOW (ref 39–117)
Bilirubin, Direct: 0.2 mg/dL (ref 0.0–0.3)
Indirect Bilirubin: 0.7 mg/dL (ref 0.2–1.2)
Total Bilirubin: 0.9 mg/dL (ref 0.2–1.2)
Total Protein: 6.5 g/dL (ref 6.0–8.3)

## 2014-01-11 ENCOUNTER — Telehealth: Payer: Self-pay | Admitting: *Deleted

## 2014-01-11 NOTE — Telephone Encounter (Signed)
lvm with pt's mother lab work good and if he has any questions he can call.

## 2014-01-19 ENCOUNTER — Other Ambulatory Visit (HOSPITAL_COMMUNITY): Payer: Self-pay | Admitting: Family Medicine

## 2014-01-27 ENCOUNTER — Ambulatory Visit: Payer: PRIVATE HEALTH INSURANCE | Admitting: Nurse Practitioner

## 2014-01-28 ENCOUNTER — Encounter: Payer: Self-pay | Admitting: Family Medicine

## 2014-01-28 ENCOUNTER — Ambulatory Visit (INDEPENDENT_AMBULATORY_CARE_PROVIDER_SITE_OTHER): Payer: PRIVATE HEALTH INSURANCE | Admitting: Family Medicine

## 2014-01-28 VITALS — BP 112/80 | Temp 98.4°F | Ht 74.0 in | Wt 242.0 lb

## 2014-01-28 DIAGNOSIS — Z23 Encounter for immunization: Secondary | ICD-10-CM

## 2014-01-28 DIAGNOSIS — E119 Type 2 diabetes mellitus without complications: Secondary | ICD-10-CM

## 2014-01-28 DIAGNOSIS — J019 Acute sinusitis, unspecified: Secondary | ICD-10-CM

## 2014-01-28 DIAGNOSIS — S0180XA Unspecified open wound of other part of head, initial encounter: Secondary | ICD-10-CM

## 2014-01-28 DIAGNOSIS — S0181XA Laceration without foreign body of other part of head, initial encounter: Secondary | ICD-10-CM

## 2014-01-28 LAB — POCT GLYCOSYLATED HEMOGLOBIN (HGB A1C): Hemoglobin A1C: 6.9

## 2014-01-28 MED ORDER — GLIPIZIDE ER 5 MG PO TB24: 5.0000 mg | ORAL_TABLET | Freq: Every day | ORAL | Status: DC

## 2014-01-28 MED ORDER — CEFPROZIL 500 MG PO TABS: 500.0000 mg | ORAL_TABLET | Freq: Two times a day (BID) | ORAL | Status: DC

## 2014-01-28 NOTE — Progress Notes (Signed)
   Subjective:    Patient ID: Jared Griffin, male    DOB: 11-09-1960, 54 y.o.   MRN: [id number]  URI  This is a new problem. The current episode started in the past 7 days. The problem has been unchanged. There has been no fever. Associated symptoms include congestion, coughing, ear pain, headaches, nausea, rhinorrhea and a sore throat. Pertinent negatives include no abdominal pain, chest pain, vomiting or wheezing. Associated symptoms comments: Body aches. He has tried nothing for the symptoms. The treatment provided no relief.   Patient states he has no other concerns at this time.    Review of Systems  Constitutional: Positive for chills and fatigue. Negative for fever, activity change and appetite change.  HENT: Positive for congestion, ear pain, rhinorrhea and sore throat.   Eyes: Negative for discharge.  Respiratory: Positive for cough. Negative for wheezing.   Cardiovascular: Negative for chest pain.  Gastrointestinal: Positive for nausea. Negative for vomiting and abdominal pain.  Endocrine: Negative for polydipsia and polyphagia.  Neurological: Positive for headaches. Negative for weakness.  Psychiatric/Behavioral: Negative for confusion.       Objective:   Physical Exam  Vitals reviewed. Constitutional: He appears well-developed and well-nourished. No distress.  HENT:  Head: Normocephalic and atraumatic.  Cardiovascular: Normal rate, regular rhythm and normal heart sounds.   No murmur heard. Pulmonary/Chest: Effort normal and breath sounds normal. No respiratory distress.  Musculoskeletal: He exhibits no edema.  Lymphadenopathy:    He has no cervical adenopathy.  Neurological: He is alert.  Psychiatric: His behavior is normal.          Assessment & Plan:  1. DM A1c 6.9 with his cardiac history trying to get A1c to 6.5 increase glipizide followup 3 months watch diet closely continue metformin healthy diet and regular physical activity - POCT glycosylated  hemoglobin (Hb A1C)  2. Cut of face Small cut on the face tetanus shot today  3. Need for prophylactic vaccination with combined diphtheria-tetanus-pertussis (DTP) vaccine See above  Followup 3 months  Mild sinusitis antibiotics prescribed - Td vaccine greater than or equal to 7yo preservative free IM

## 2014-02-01 ENCOUNTER — Telehealth: Payer: Self-pay | Admitting: *Deleted

## 2014-02-01 NOTE — Telephone Encounter (Signed)
Spoke with patient's wife [name], appointment was r/s to Friday 5/22 at 9:30 am

## 2014-02-03 ENCOUNTER — Ambulatory Visit: Payer: PRIVATE HEALTH INSURANCE | Admitting: Neurology

## 2014-02-04 ENCOUNTER — Encounter: Payer: Self-pay | Admitting: *Deleted

## 2014-02-05 ENCOUNTER — Ambulatory Visit (INDEPENDENT_AMBULATORY_CARE_PROVIDER_SITE_OTHER): Payer: PRIVATE HEALTH INSURANCE | Admitting: Neurology

## 2014-02-05 ENCOUNTER — Encounter: Payer: Self-pay | Admitting: Neurology

## 2014-02-05 VITALS — BP 119/76 | HR 91 | Ht 74.0 in | Wt 239.0 lb

## 2014-02-05 DIAGNOSIS — G609 Hereditary and idiopathic neuropathy, unspecified: Secondary | ICD-10-CM

## 2014-02-05 DIAGNOSIS — G5760 Lesion of plantar nerve, unspecified lower limb: Secondary | ICD-10-CM

## 2014-02-05 NOTE — Progress Notes (Signed)
[facility]    Provider:  Dr Janann Colonel Referring Provider: Mikey Kirschner, MD Primary Care Physician:  Rubbie Battiest, MD  CC:  Pain in his feet  HPI:  Jared Griffin is a 53 y.o. male here as a referral from Dr. Wolfgang Phoenix for question of peripheral neuropathy  Notes being diagnosed with mortons neuroma 2-3 years ago, lately has been having some severe pain in his toes and then radiates proximally up to mid foot. Constant severe stabbing pain with sharp exacerbations. Gets worse with socks, shoes, sheets at night. Will get worse with prolonged standing. No weakness in lower extremities. Minimal numb sensation, mainly just pain. No sensory or motor symptoms in bilateral UE. Has been working with his podiatrist, has had injections into the neuroma, had a pain block last week. Notes the pain block last week did help with the pain. They are considering surgery but wanted an evaluation to determine if there is a neuropathy causing his symptoms.  Has history of DM, states his control is improving. Currently taking Gabapentin 328m TID and tramadol as needed for the pain.   Review of Systems: Out of a complete 14 system review, the patient complains of only the following symptoms, and all other reviewed systems are negative. + foot pain  History   Social History  . Marital Status: Married    Spouse Name: N/A    Number of Children: N/A  . Years of Education: N/A   Occupational History  . Not on file.   Social History Main Topics  . Smoking status: Former Smoker -- 0.50 packs/day for 10 years    Types: Cigarettes    Quit date: 09/17/1983  . Smokeless tobacco: Former [employer] developer   Types: [place]: 09/17/1983  . Alcohol Use: No  . Drug Use: No  . Sexual Activity: Yes   Other Topics Concern  . 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    Married with 1 child   Right handed    12 th    Very little caffeine    No family history on file.  Past Medical History  Diagnosis Date  . Essential hypertension, benign   . Mixed hyperlipidemia 2006  . 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 Arthritis   . Fatty liver disease, nonalcoholic   . Coronary atherosclerosis of native coronary artery     a. 12/09/2013: s/p PCI with 3.0 x 28 Promus DES to mLAD  . Osgood-Schlatter's disease of right knee   . Type 2 diabetes mellitus   . Obesity     Past Surgical History  Procedure Laterality Date  . Liver biopsy    . Knee arthroplasty Right 1999    "bone growing on top of kneecap" (12/09/2013)  . Colonoscopy  06/19/2012    Procedure: COLONOSCOPY;  Surgeon: NRogene Houston MD;  Location: [facility];  Service: Endoscopy;  Laterality: N/A;  930  . Cardiac catheterization  2011  . Coronary angioplasty with stent placement  12/09/2013    "1"  . Cholecystectomy  2003  . Tonsillectomy  1970's    Current Outpatient Prescriptions  Medication Sig Dispense Refill  . ACCU-CHEK AVIVA PLUS test strip UP TO FOUR TIMES DAILY  100 each  0  . acetaminophen (TYLENOL) 500 MG tablet Take 1,000 mg by mouth daily as needed for headache. Pain.      .Marland Kitchen  aspirin (ASPIR-LOW) 81 MG EC tablet Take 81 mg by mouth daily.       Marland Kitchen atorvastatin (LIPITOR) 40 MG tablet Take 1 tablet (40 mg total) by mouth daily.  30 tablet  5  . cefPROZIL (CEFZIL) 500 MG tablet Take 1 tablet (500 mg total) by mouth 2 (two) times daily.  20 tablet  0  . clopidogrel (PLAVIX) 75 MG tablet Take 1 tablet (75 mg total) by mouth daily with breakfast.  30 tablet  11  . enalapril (VASOTEC) 10 MG tablet TAKE ONE (1) TABLET AT BEDTIME  30 tablet  0  . gabapentin (NEURONTIN) 300 MG capsule Take 300 mg by mouth 3 (three) times daily.      Marland Kitchen glipiZIDE (GLUCOTROL XL) 5 MG 24 hr tablet Take 1 tablet (5 mg total) by mouth daily with breakfast.  30 tablet  6  . HYDROcodone-acetaminophen (NORCO) 7.5-325 MG per tablet Take 1  tablet by mouth every 4 (four) hours as needed for moderate pain.      . metFORMIN (GLUCOPHAGE) 500 MG tablet Take 500-1,000 mg by mouth 3 (three) times daily. Take 1000 mg by mouth in the morning, 500 mg at lunch, and 1000 mg at supper.      . nitroGLYCERIN (NITROSTAT) 0.4 MG SL tablet Place 1 tablet (0.4 mg total) under the tongue every 5 (five) minutes as needed for chest pain.  25 tablet  3  . ranitidine (ZANTAC) 150 MG tablet Take 150 mg by mouth daily as needed for heartburn.       No current facility-administered medications for this visit.    Allergies as of 02/05/2014  . (No Known Allergies)    Vitals: BP 119/76  Pulse 91  Ht 6' 2"  (1.88 m)  Wt 239 lb (108.41 kg)  BMI 30.67 kg/m2 Last Weight:  Wt Readings from Last 1 Encounters:  02/05/14 239 lb (108.41 kg)   Last Height:   Ht Readings from Last 1 Encounters:  02/05/14 6' 2"  (1.88 m)     Physical exam: Exam: Gen: NAD, conversant Eyes: anicteric sclerae, moist conjunctivae HENT: Atraumatic, oropharynx clear Neck: Trachea midline; supple,  Lungs: CTA, no wheezing, rales, rhonic                          CV: RRR, no MRG Abdomen: Soft, non-tender;  Extremities: No peripheral edema  Skin: Normal temperature, no rash,  Psych: Appropriate affect, pleasant  Neuro: MS: AA&Ox3, appropriately interactive, normal affect   Attention: WORLD backwards  Speech: fluent w/o paraphasic error  Memory: good recent and remote recall  CN: PERRL, EOMI no nystagmus, no ptosis, sensation intact to LT V1-V3 bilat, face symmetric, no weakness, hearing grossly intact, palate elevates symmetrically, shoulder shrug 5/5 bilat,  tongue protrudes midline, no fasiculations noted.  Motor: normal bulk and tone Strength: Mild decreased vibration bilateral LE to mid shin, otherwise normal sensation to LT, PP, temp in bilateral LE  Coord: rapid alternating and point-to-point (FNF, HTS) movements intact.  Reflexes: symmetrical, bilat  downgoing toes  Sens: LT intact in all extremities  Gait: posture, stance, stride and arm-swing normal. Tandem gait intact. Able to walk on heels and toes. Romberg absent.   Assessment:  After physical and neurologic examination, review of laboratory studies, imaging, neurophysiology testing and pre-existing records, assessment will be reviewed on the problem list.  Plan:  Treatment plan and additional workup will be reviewed under Problem List.  1)Morton's neuroma 2)Peripheral neuropathy  53y/o  gentleman with history of DM presenting for initial evaluation of chronic foot pain. He has a diagnosis of Morton's neuroma for which he is followed by podiatry. He had a recent nerve block with good relief. Physical exam shows some mild peripheral neuropathy though I suspect the majority of his pain symptoms are coming from the Morton's neuroma. Will check lab work and EMG/NCS to evaluate for peripheral neuropathy. Follow up once workup completed.   Jim Like, DO  [facility] [address] [place], [place] [address]  Phone [phone] Fax [phone]

## 2014-02-09 ENCOUNTER — Telehealth: Payer: Self-pay | Admitting: Neurology

## 2014-02-09 ENCOUNTER — Other Ambulatory Visit (HOSPITAL_COMMUNITY): Payer: Self-pay | Admitting: Family Medicine

## 2014-02-09 LAB — METHYLMALONIC ACID, SERUM: Methylmalonic Acid: 145 nmol/L (ref 0–378)

## 2014-02-09 LAB — TSH: TSH: 0.493 u[IU]/mL (ref 0.450–4.500)

## 2014-02-09 LAB — VITAMIN B12: Vitamin B-12: 426 pg/mL (ref 211–946)

## 2014-02-09 LAB — HGB A1C W/O EAG: Hgb A1c MFr Bld: 8 % — ABNORMAL HIGH (ref 4.8–5.6)

## 2014-02-09 NOTE — Telephone Encounter (Signed)
Patient's wife returning call about lab results. Please call.

## 2014-02-09 NOTE — Telephone Encounter (Signed)
Called patient to discuss lab work, specifically elevated Hemoglobin A1c. He will follow up with PCP to work on new diabetes regimen. Will follow up after EMG/NCS

## 2014-02-10 ENCOUNTER — Encounter: Payer: Self-pay | Admitting: Family Medicine

## 2014-02-10 NOTE — Telephone Encounter (Signed)
Spoke with wife and she said that patient had received message from Dr Janann Colonel concerning results, is scheduled to f/u with pcp today as suggested by Dr Janann Colonel.

## 2014-02-12 ENCOUNTER — Telehealth: Payer: Self-pay | Admitting: Family Medicine

## 2014-02-12 MED ORDER — GLIPIZIDE 5 MG PO TABS: 5.0000 mg | ORAL_TABLET | Freq: Two times a day (BID) | ORAL | Status: DC

## 2014-02-12 NOTE — Telephone Encounter (Signed)
Rx sent electronically to pharmacy. Patient notified via my chart.

## 2014-02-12 NOTE — Telephone Encounter (Signed)
Calling to let you know that patients glipizide is 24 hour. He needs a new prescription.   Jared Griffin

## 2014-02-12 NOTE — Telephone Encounter (Signed)
Change to reg 59m tabs

## 2014-02-18 ENCOUNTER — Encounter (HOSPITAL_COMMUNITY): Payer: PRIVATE HEALTH INSURANCE

## 2014-02-22 ENCOUNTER — Other Ambulatory Visit: Payer: PRIVATE HEALTH INSURANCE

## 2014-02-22 ENCOUNTER — Encounter: Payer: Self-pay | Admitting: Interventional Cardiology

## 2014-02-22 ENCOUNTER — Ambulatory Visit (INDEPENDENT_AMBULATORY_CARE_PROVIDER_SITE_OTHER): Payer: PRIVATE HEALTH INSURANCE | Admitting: Interventional Cardiology

## 2014-02-22 ENCOUNTER — Telehealth: Payer: Self-pay | Admitting: *Deleted

## 2014-02-22 VITALS — BP 122/72 | HR 67 | Ht 74.0 in | Wt 238.9 lb

## 2014-02-22 DIAGNOSIS — Z01810 Encounter for preprocedural cardiovascular examination: Secondary | ICD-10-CM

## 2014-02-22 DIAGNOSIS — E785 Hyperlipidemia, unspecified: Secondary | ICD-10-CM

## 2014-02-22 DIAGNOSIS — I10 Essential (primary) hypertension: Secondary | ICD-10-CM

## 2014-02-22 DIAGNOSIS — I2510 Atherosclerotic heart disease of native coronary artery without angina pectoris: Secondary | ICD-10-CM

## 2014-02-22 LAB — LIPID PANEL
Cholesterol: 105 mg/dL (ref 0–200)
HDL: 29.6 mg/dL — ABNORMAL LOW (ref >39.00)
LDL Cholesterol: 48 mg/dL (ref 0–99)
NonHDL: 75.4
Total CHOL/HDL Ratio: 4
Triglycerides: 135 mg/dL (ref 0.0–149.0)
VLDL: 27 mg/dL (ref 0.0–40.0)

## 2014-02-22 LAB — HEPATIC FUNCTION PANEL
ALT: 36 U/L (ref 0–53)
AST: 28 U/L (ref 0–37)
Albumin: 4.2 g/dL (ref 3.5–5.2)
Alkaline Phosphatase: 31 U/L — ABNORMAL LOW (ref 39–117)
Bilirubin, Direct: 0.3 mg/dL (ref 0.0–0.3)
Total Bilirubin: 1.4 mg/dL — ABNORMAL HIGH (ref 0.2–1.2)
Total Protein: 6.5 g/dL (ref 6.0–8.3)

## 2014-02-22 NOTE — Progress Notes (Signed)
Patient ID: Jared Griffin, male   DOB: 03/05/1961, 53 y.o.   MRN: [id number]    [address] [place], [place]  [address] Phone: [phone] Fax:  [phone]  Date:  02/22/2014   ID:  Jared Griffin, DOB 14-May-1961, MRN [id number]  PCP:  Rubbie Battiest, MD      History of Present Illness: Jared Griffin is a 53 y.o. male who had an LAD stent in March 2015.  He has done well since that time.  He has resumed all of his normal activities.  He is back to work full time.  He was told at the [facility] rehab that it was not necessary ue to his high level of activity.   Reports occasional SHOB with exertion. Otherwise feel wells. Denies dizziness, chest pain, palpitations, orthopnea, PND. No bleeding. Exercises 2x per week for 44mn on treadmill and elliptical. BPs at work is around 130s/60-70.   He is onsidering foot surgery by a podiatrist.  He was told he would have to stop plavix or 7 days prior.   Wt Readings from Last 3 Encounters:  02/22/14 238 lb 14.4 oz (108.364 kg)  02/05/14 239 lb (108.41 kg)  01/28/14 242 lb (109.77 kg)     Past Medical History  Diagnosis Date  . Essential hypertension, benign   . Mixed hyperlipidemia 2006  . 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 Arthritis   . Fatty liver disease, nonalcoholic   . Coronary atherosclerosis of native coronary artery     a. 12/09/2013: s/p PCI with 3.0 x 28 Promus DES to mLAD  . Osgood-Schlatter's disease of right knee   . Type 2 diabetes mellitus   . Obesity     Current Outpatient Prescriptions  Medication Sig Dispense Refill  . ACCU-CHEK AVIVA PLUS test strip UP TO FOUR TIMES DAILY  100 each  0  . acetaminophen (TYLENOL) 500 MG tablet Take 1,000 mg by mouth daily as needed for headache. Pain.      .Marland Kitchenaspirin (ASPIR-LOW) 81 MG EC tablet Take 81 mg by mouth daily.       .Marland Kitchenatorvastatin (LIPITOR) 40 MG tablet Take 1 tablet (40 mg total) by mouth daily.  30 tablet  5  . cefPROZIL (CEFZIL) 500 MG  tablet Take 1 tablet (500 mg total) by mouth 2 (two) times daily.  20 tablet  0  . clopidogrel (PLAVIX) 75 MG tablet Take 1 tablet (75 mg total) by mouth daily with breakfast.  30 tablet  11  . enalapril (VASOTEC) 10 MG tablet TAKE ONE (1) TABLET AT BEDTIME  30 tablet  2  . gabapentin (NEURONTIN) 300 MG capsule Take 300 mg by mouth 3 (three) times daily.      .Marland KitchenglipiZIDE (GLUCOTROL) 5 MG tablet Take 1 tablet (5 mg total) by mouth 2 (two) times daily before a meal.  60 tablet  3  . HYDROcodone-acetaminophen (NORCO) 7.5-325 MG per tablet Take 1 tablet by mouth every 4 (four) hours as needed for moderate pain.      . metFORMIN (GLUCOPHAGE) 500 MG tablet Take 500-1,000 mg by mouth 3 (three) times daily. Take 1000 mg by mouth in the morning, 500 mg at lunch, and 1000 mg at supper.      . nitroGLYCERIN (NITROSTAT) 0.4 MG SL tablet Place 1 tablet (0.4 mg total) under the tongue every 5 (five) minutes as needed for chest pain.  25 tablet  3  . ranitidine (  ZANTAC) 150 MG tablet Take 150 mg by mouth daily as needed for heartburn.       No current facility-administered medications for this visit.    Allergies:   No Known Allergies  Social History:  The patient  reports that he quit smoking about 30 years ago. His smoking use included Cigarettes. He has a 5 pack-year smoking history. He quit smokeless tobacco use about 30 years ago. His smokeless tobacco use included Chew. He reports that he does not drink alcohol or use illicit drugs.   Family History:  The patient's family history is not on file.   ROS:  Please see the history of present illness.  No nausea, vomiting.  No fevers, chills.  No focal weakness.  No dysuria.    All other systems reviewed and negative.   PHYSICAL EXAM: VS:  BP 122/72  Pulse 67  Ht 6' 2"  (1.88 m)  Wt 238 lb 14.4 oz (108.364 kg)  BMI 30.66 kg/m2 Well nourished, well developed, in no acute distress HEENT: normal Neck: no JVD, no carotid bruits Cardiac:  normal S1, S2;  RRR;  Lungs:  clear to auscultation bilaterally, no wheezing, rhonchi or rales Abd: soft, nontender, no hepatomegaly Ext: no edema Skin: warm and dry Neuro:   no focal abnormalities noted       ASSESSMENT AND PLAN:  1. CAD: No angina.  Continue dual antiplatelet thereapy. 2. HTN: BP controlled.  Continue current meds.  3. Hyperlipidemia: TG elevated.  Switched to lipitor. Check lipids today.   4. Preoperative eval.: Cannot stop plavix at this time for foot surgery.  Would have to wait a year.  If foot surgery can be done on Plavix, that is ok.   Signed, Mina Marble, MD, [facility] 02/22/2014 8:17 AM

## 2014-02-22 NOTE — Patient Instructions (Signed)
Your physician recommends that you continue on your current medications as directed. Please refer to the Current Medication list given to you today.  Your physician wants you to follow-up in: 4 months with Dr. Irish Lack. You will receive a reminder letter in the mail two months in advance. If you don't receive a letter, please call our office to schedule the follow-up appointment.

## 2014-02-22 NOTE — Telephone Encounter (Signed)
pt notified about lab results with verbal understanding today.

## 2014-02-24 ENCOUNTER — Encounter (INDEPENDENT_AMBULATORY_CARE_PROVIDER_SITE_OTHER): Payer: Self-pay | Admitting: Radiology

## 2014-02-24 ENCOUNTER — Ambulatory Visit (INDEPENDENT_AMBULATORY_CARE_PROVIDER_SITE_OTHER): Payer: PRIVATE HEALTH INSURANCE | Admitting: Neurology

## 2014-02-24 DIAGNOSIS — E1142 Type 2 diabetes mellitus with diabetic polyneuropathy: Secondary | ICD-10-CM

## 2014-02-24 DIAGNOSIS — Z0289 Encounter for other administrative examinations: Secondary | ICD-10-CM

## 2014-02-24 DIAGNOSIS — G609 Hereditary and idiopathic neuropathy, unspecified: Secondary | ICD-10-CM

## 2014-02-24 NOTE — Procedures (Signed)
     HISTORY:  Jared Griffin is a 54 year old gentleman with a ten-year history of diabetes. The patient reports some discomfort with the feet over the last one year associated with pain mainly in the ball of foot associated with weightbearing. The patient may also have some discomfort when he is off of his feet. He is being evaluated for a possible neuropathy or a lumbosacral radiculopathy. The patient reports no history of back pain or pain radiating down the legs.  NERVE CONDUCTION STUDIES:  Nerve conduction studies were performed on the left upper extremity. The distal motor latencies and motor amplitudes for the median and ulnar nerves were within normal limits. The F wave latencies and nerve conduction velocities for these nerves were also normal. The sensory latencies for the median and ulnar nerves were normal.  Nerve conduction studies were performed on both lower extremities. The distal motor latencies for the peroneal and posterior tibial nerves were normal bilaterally, with normal motor amplitudes for these nerves bilaterally. Slowing was seen for the left peroneal nerve, with normal nerve conduction velocities seen for the right peroneal nerve and for the posterior tibial nerves bilaterally. The F wave latencies for the peroneal and posterior tibial nerves were prolonged bilaterally. The peroneal sensory latencies were borderline normal on the left, prolonged on the right, with medial and lateral plantar sensory latencies that were unobtainable bilaterally.  EMG STUDIES:  EMG study was performed on the right lower extremity:  The tibialis anterior muscle reveals 2 to 3K motor units with full recruitment. No fibrillations or positive waves were seen. The peroneus tertius muscle reveals 2 to 4K motor units with full recruitment. No fibrillations or positive waves were seen. The medial gastrocnemius muscle reveals 1 to 3K motor units with full recruitment. One plus fibrillations and  positive waves were seen. The vastus lateralis muscle reveals 2 to 4K motor units with full recruitment. No fibrillations or positive waves were seen. The iliopsoas muscle reveals 2 to 4K motor units with full recruitment. No fibrillations or positive waves were seen. The biceps femoris muscle (long head) reveals 2 to 4K motor units with full recruitment. No fibrillations or positive waves were seen. The lumbosacral paraspinal muscles were tested at 3 levels, and revealed no abnormalities of insertional activity at all 3 levels tested. There was good relaxation.   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 EMG evaluation of the right lower extremity was relatively unremarkable with exception of isolated acute denervation seen in the left gastrocnemius muscle. No clear evidence of an overlying lumbosacral radiculopathy is seen.  Jill Alexanders MD 02/24/2014 10:50 AM  [facility] [address] [place], [place] [address]  Phone [phone] Fax [phone]

## 2014-02-26 NOTE — Progress Notes (Signed)
Quick Note:  Spoke with patient's wife informed her of EMG/NCS results, she stated that since patient has been on the Glipizide his levels are better. ______

## 2014-03-01 ENCOUNTER — Encounter: Payer: Self-pay | Admitting: Family Medicine

## 2014-03-09 ENCOUNTER — Telehealth: Payer: Self-pay | Admitting: Interventional Cardiology

## 2014-03-09 NOTE — Telephone Encounter (Signed)
FMLA Dropped Off By Wife, This Was Given To Amy For Dr.Varanasi to Complete  Wife Was made aware Of the 10-14 Day Business Waiting Period, once Completed Fax to [place] [facility]/[employer] @ [phone]   6.23.15/km

## 2014-03-17 ENCOUNTER — Telehealth: Payer: Self-pay | Admitting: Interventional Cardiology

## 2014-03-17 NOTE — Telephone Encounter (Signed)
FMLA faxed to [place] @ [phone], Original Mailed to  Pt Home Address 7.1.15/km

## 2014-04-26 ENCOUNTER — Ambulatory Visit (INDEPENDENT_AMBULATORY_CARE_PROVIDER_SITE_OTHER): Payer: Self-pay | Admitting: Internal Medicine

## 2014-05-04 ENCOUNTER — Ambulatory Visit: Payer: PRIVATE HEALTH INSURANCE | Admitting: Family Medicine

## 2014-05-04 ENCOUNTER — Ambulatory Visit (INDEPENDENT_AMBULATORY_CARE_PROVIDER_SITE_OTHER): Payer: PRIVATE HEALTH INSURANCE | Admitting: Family Medicine

## 2014-05-04 ENCOUNTER — Encounter: Payer: Self-pay | Admitting: Family Medicine

## 2014-05-04 VITALS — BP 110/80 | Ht 74.0 in | Wt 241.0 lb

## 2014-05-04 DIAGNOSIS — E782 Mixed hyperlipidemia: Secondary | ICD-10-CM

## 2014-05-04 DIAGNOSIS — K7689 Other specified diseases of liver: Secondary | ICD-10-CM

## 2014-05-04 DIAGNOSIS — K760 Fatty (change of) liver, not elsewhere classified: Secondary | ICD-10-CM

## 2014-05-04 DIAGNOSIS — I10 Essential (primary) hypertension: Secondary | ICD-10-CM

## 2014-05-04 DIAGNOSIS — E119 Type 2 diabetes mellitus without complications: Secondary | ICD-10-CM

## 2014-05-04 LAB — POCT GLYCOSYLATED HEMOGLOBIN (HGB A1C): Hemoglobin A1C: 7.6

## 2014-05-04 MED ORDER — GLIPIZIDE 5 MG PO TABS: 10.0000 mg | ORAL_TABLET | Freq: Two times a day (BID) | ORAL | Status: DC

## 2014-05-04 NOTE — Progress Notes (Signed)
   Subjective:    Patient ID: Jared Griffin, male    DOB: 08-04-1961, 53 y.o.   MRN: [id number]  Diabetes He presents for his follow-up diabetic visit. He has type 2 diabetes mellitus. His disease course has been stable. There are no hypoglycemic associated symptoms. There are no diabetic associated symptoms. There are no hypoglycemic complications. Symptoms are stable. There are no diabetic complications. There are no known risk factors for coronary artery disease. Current diabetic treatment includes oral agent (dual therapy). He is compliant with treatment all of the time.   Patient states he wants to discuss his issue with congestion. He states he has taken several medications in the past and nothing has worked. Patient notes to drainage sensation. Often collects in the throat and causes him to clear his throat. Did not respond to antihistamines.  Results for orders placed in visit on 05/04/14  POCT GLYCOSYLATED HEMOGLOBIN (HGB A1C)      Result Value Ref Range   Hemoglobin A1C 7.6     Hoping to get on exrcise soon  Hoping to get feet operated upon, get in feet for morton's neurpma  Trying to eat goo d varitety of foods  Sticking with bp meds, numbers good when ck'ed [facility]lsewhere  Less than yr saw eye doc no evid of diab;  chamged chol med to generic lipitor. Compliant with the medication. No obvious side effects.    Review of Systems No headache no chest pain no back pain no abdominal pain no shortness breath no nausea no change in bowel habits ROS otherwise negative    Objective:   Physical Exam  Alert vital signs stable HEENT mom his congestion. Blood pressure good. Lungs clear. Heart regular rate and rhythm. Ankles no edema.      Assessment & Plan:  Impression 1 type 2 diabetes discussed suboptimum control. #2 coronary artery disease clinically stable. #3 hyperlipidemia improved discussed maintain same dose of Lipitor. #4 hypertension clinically stable #5 chronic pain we  prescribed hydrocodone episodically for neck and arthritis pain. Uses it sparingly. Does not need a refill at this time. Plan as noted above. Increase glipizide to 2 tablets twice a day. Rationale discussed. Recheck in several months. WSL

## 2014-05-07 ENCOUNTER — Other Ambulatory Visit: Payer: Self-pay | Admitting: *Deleted

## 2014-05-07 MED ORDER — AZELASTINE HCL 0.1 % NA SOLN: 2.0000 | Freq: Two times a day (BID) | NASAL | Status: DC

## 2014-05-14 ENCOUNTER — Ambulatory Visit: Payer: PRIVATE HEALTH INSURANCE | Admitting: Family Medicine

## 2014-05-17 ENCOUNTER — Other Ambulatory Visit (HOSPITAL_COMMUNITY): Payer: Self-pay | Admitting: Family Medicine

## 2014-06-16 ENCOUNTER — Other Ambulatory Visit (HOSPITAL_COMMUNITY): Payer: Self-pay | Admitting: Family Medicine

## 2014-06-18 ENCOUNTER — Other Ambulatory Visit (HOSPITAL_COMMUNITY): Payer: Self-pay | Admitting: Family Medicine

## 2014-06-25 ENCOUNTER — Other Ambulatory Visit: Payer: Self-pay | Admitting: Physician Assistant

## 2014-06-28 ENCOUNTER — Ambulatory Visit (INDEPENDENT_AMBULATORY_CARE_PROVIDER_SITE_OTHER): Payer: PRIVATE HEALTH INSURANCE | Admitting: Family Medicine

## 2014-06-28 ENCOUNTER — Ambulatory Visit (INDEPENDENT_AMBULATORY_CARE_PROVIDER_SITE_OTHER): Payer: PRIVATE HEALTH INSURANCE | Admitting: Interventional Cardiology

## 2014-06-28 ENCOUNTER — Telehealth: Payer: Self-pay | Admitting: Interventional Cardiology

## 2014-06-28 ENCOUNTER — Encounter: Payer: Self-pay | Admitting: Interventional Cardiology

## 2014-06-28 ENCOUNTER — Encounter: Payer: Self-pay | Admitting: Family Medicine

## 2014-06-28 VITALS — BP 124/80 | HR 99 | Ht 74.0 in | Wt 238.0 lb

## 2014-06-28 VITALS — BP 138/82 | Ht 74.0 in | Wt 238.0 lb

## 2014-06-28 DIAGNOSIS — J329 Chronic sinusitis, unspecified: Secondary | ICD-10-CM

## 2014-06-28 DIAGNOSIS — E782 Mixed hyperlipidemia: Secondary | ICD-10-CM

## 2014-06-28 DIAGNOSIS — I2510 Atherosclerotic heart disease of native coronary artery without angina pectoris: Secondary | ICD-10-CM

## 2014-06-28 DIAGNOSIS — Z01810 Encounter for preprocedural cardiovascular examination: Secondary | ICD-10-CM

## 2014-06-28 DIAGNOSIS — I10 Essential (primary) hypertension: Secondary | ICD-10-CM

## 2014-06-28 MED ORDER — AMOXICILLIN-POT CLAVULANATE 875-125 MG PO TABS: 1.0000 | ORAL_TABLET | Freq: Two times a day (BID) | ORAL | Status: AC

## 2014-06-28 NOTE — Patient Instructions (Signed)
Your physician recommends that you continue on your current medications as directed. Please refer to the Current Medication list given to you today.  -Hold Plavix 2 days prior to foot surgery and restart the night of the surgery.   Your physician wants you to follow-up in: 6 months with Dr. Christ Kick. You will receive a reminder letter in the mail two months in advance. If you don't receive a letter, please call our office to schedule the follow-up appointment.

## 2014-06-28 NOTE — Progress Notes (Signed)
   Subjective:    Patient ID: Jared Griffin, male    DOB: September 07, 1961, 53 y.o.   MRN: [id number]  Sinusitis This is a new problem. The current episode started in the past 7 days. Associated symptoms include congestion and headaches. Treatments tried: otc meds.   Flu shot lately  Frontal headache and sharp at times  wrorse with deep breath  Using sinus meds, sone cong and drainaes  occas headaches off and on  No gi   On two glips bid and two metformins bid plus one dose at lunch   Review of Systems  HENT: Positive for congestion.   Neurological: Positive for headaches.       Objective:   Physical Exam  Alert no apparent distress. Mild malaise. Frontal maxillary tenderness. Pharynx erythematous neck supple. Lungs clear. Heart regular in rhythm.      Assessment & Plan:  Impression rhinosinusitis plan antibiotics prescribed. Since Medicare discussed. Warning signs discussed. WSL

## 2014-06-28 NOTE — Telephone Encounter (Signed)
New message           [facility] Name & Phone number Dr. Allene Pyo [phone] / Fax number [phone]

## 2014-06-28 NOTE — Telephone Encounter (Signed)
Faxed OV note.

## 2014-06-28 NOTE — Progress Notes (Signed)
Patient ID: Jared Griffin, male   DOB: 01/27/1961, 53 y.o.   MRN: [id number] Patient ID: Jared Griffin, male   DOB: [birth year], 53 y.o.   MRN: [id number]    [place], [place],   [address] Phone: [phone] Fax:  [phone]  Date:  06/28/2014   ID:  GARLON TUGGLE, DOB 1961/02/17, MRN [id number]  PCP:  Rubbie Battiest, MD      History of Present Illness: Jared Griffin is a 53 y.o. male who had an LAD stent in March 2015.  He has done well since that time.  He has resumed all of his normal activities.  He is back to work full time.  He was told at the [facility] rehab that it was not necessary ue to his high level of activity.   Reports occasional SHOB with exertion. Otherwise feel wells. Denies dizziness, chest pain, palpitations, orthopnea, PND. No bleeding. Exercises 2x per week for 54mn on treadmill and elliptical. BPs at work is around 130s/60-70.   He is considering foot surgery by a podiatrist.  He was told he would have to stop plavix or 7 days prior.   Wt Readings from Last 3 Encounters:  06/28/14 238 lb (107.956 kg)  05/04/14 241 lb (109.317 kg)  02/22/14 238 lb 14.4 oz (108.364 kg)     Past Medical History  Diagnosis Date  . Essential hypertension, benign   . Mixed hyperlipidemia 2006  . 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 Arthritis   . Fatty liver disease, nonalcoholic   . Coronary atherosclerosis of native coronary artery     a. 12/09/2013: s/p PCI with 3.0 x 28 Promus DES to mLAD  . Osgood-Schlatter's disease of right knee   . Type 2 diabetes mellitus   . Obesity     Current Outpatient Prescriptions  Medication Sig Dispense Refill  . ACCU-CHEK AVIVA PLUS test strip CHECK BLOOD SUGAR UP TO 4 TIMES A DAY  100 each  5  . acetaminophen (TYLENOL) 500 MG tablet Take 1,000 mg by mouth daily as needed for headache. Pain.      .Marland Kitchenaspirin (ASPIR-LOW) 81 MG EC tablet Take 81 mg by mouth daily.       .Marland Kitchenatorvastatin (LIPITOR)  40 MG tablet TAKE ONE (1) TABLET BY MOUTH EVERY DAY  30 tablet  0  . azelastine (ASTELIN) 0.1 % nasal spray Place 2 sprays into both nostrils 2 (two) times daily. Use in each nostril as directed  30 mL  5  . clopidogrel (PLAVIX) 75 MG tablet Take 1 tablet (75 mg total) by mouth daily with breakfast.  30 tablet  11  . enalapril (VASOTEC) 10 MG tablet TAKE ONE (1) TABLET AT BEDTIME  30 tablet  2  . glipiZIDE (GLUCOTROL) 5 MG tablet Take 2 tablets (10 mg total) by mouth 2 (two) times daily before a meal.  120 tablet  5  . HYDROcodone-acetaminophen (NORCO) 7.5-325 MG per tablet Take 1 tablet by mouth every 4 (four) hours as needed for moderate pain.      . metFORMIN (GLUCOPHAGE) 500 MG tablet TAKE 2 TABLETS EVERY MORNING, 1 TABLET AT NOON AND TAKE 2 TABLETS EVERY EVENING  450 tablet  0  . nitroGLYCERIN (NITROSTAT) 0.4 MG SL tablet Place 1 tablet (0.4 mg total) under the tongue every 5 (five) minutes as needed for chest pain.  25 tablet  3  . ranitidine (ZANTAC) 150 MG tablet Take 150  mg by mouth daily as needed for heartburn.       No current facility-administered medications for this visit.    Allergies:   No Known Allergies  Social History:  The patient  reports that he quit smoking about 30 years ago. His smoking use included Cigarettes. He has a 5 pack-year smoking history. He quit smokeless tobacco use about 30 years ago. His smokeless tobacco use included Chew. He reports that he does not drink alcohol or use illicit drugs.   Family History:  The patient's family history includes CVA in his maternal grandfather and maternal grandmother; Cancer in his maternal aunt.   ROS:  Please see the history of present illness.  No nausea, vomiting.  No fevers, chills.  No focal weakness.  No dysuria.    All other systems reviewed and negative.   PHYSICAL EXAM: VS:  BP 124/80  Pulse 99  Ht 6' 2"  (1.88 m)  Wt 238 lb (107.956 kg)  BMI 30.54 kg/m2  SpO2 96% Well nourished, well developed, in no acute  distress HEENT: normal Neck: no JVD, no carotid bruits Cardiac:  normal S1, S2; RRR;  Lungs:  clear to auscultation bilaterally, no wheezing, rhonchi or rales Abd: soft, nontender, no hepatomegaly Ext: no edema Skin: warm and dry Neuro:   no focal abnormalities noted       ASSESSMENT AND PLAN:  1. CAD: No angina.  Continue dual antiplatelet therapy as noted below.  No bleeding problems. 2. HTN: BP controlled.  Continue current meds.  3. Hyperlipidemia: TG elevated in the past.  Switched to lipitor.  TG better now on atorvastatin. 4. Preoperative eval.: Discussed stopping plavix at this time for foot surgery.  Ideally would wait a year, but his sx are getting worse.  He is unable to complete his regular daily activities without pain.  He understands the risks of stopping plavix for a few days before one year and he still wants to proceed with surgery.  Will have him stop plavix for the shortest time possible according to surgeon, which is 2 days prior to surgery.  He will restart the plavix immediately after the surgery.  5. Dr. Berline Lopes is his podiatrist, in [place].  Will send him a note.  Signed, Mina Marble, MD, [facility] 06/28/2014 9:19 AM

## 2014-07-07 ENCOUNTER — Telehealth: Payer: Self-pay | Admitting: Neurology

## 2014-07-07 NOTE — Telephone Encounter (Signed)
Prior Dr. Janann Colonel patient. Spoke with Ms. Patlan who stated that he will be having surgery in November on his feet. They will call us back if they need [place].

## 2014-07-27 ENCOUNTER — Other Ambulatory Visit: Payer: Self-pay | Admitting: Interventional Cardiology

## 2014-08-02 ENCOUNTER — Ambulatory Visit: Payer: PRIVATE HEALTH INSURANCE | Admitting: Family Medicine

## 2014-08-11 ENCOUNTER — Other Ambulatory Visit: Payer: Self-pay | Admitting: Family Medicine

## 2014-08-26 ENCOUNTER — Encounter (HOSPITAL_COMMUNITY): Payer: Self-pay | Admitting: Interventional Cardiology

## 2014-09-14 ENCOUNTER — Other Ambulatory Visit: Payer: Self-pay | Admitting: Family Medicine

## 2014-09-21 ENCOUNTER — Encounter: Payer: Self-pay | Admitting: Family Medicine

## 2014-09-21 ENCOUNTER — Ambulatory Visit (INDEPENDENT_AMBULATORY_CARE_PROVIDER_SITE_OTHER): Payer: PRIVATE HEALTH INSURANCE | Admitting: Family Medicine

## 2014-09-21 VITALS — BP 130/82 | Ht 74.0 in | Wt 239.4 lb

## 2014-09-21 DIAGNOSIS — E1143 Type 2 diabetes mellitus with diabetic autonomic (poly)neuropathy: Secondary | ICD-10-CM

## 2014-09-21 DIAGNOSIS — E785 Hyperlipidemia, unspecified: Secondary | ICD-10-CM

## 2014-09-21 DIAGNOSIS — I10 Essential (primary) hypertension: Secondary | ICD-10-CM

## 2014-09-21 DIAGNOSIS — E119 Type 2 diabetes mellitus without complications: Secondary | ICD-10-CM

## 2014-09-21 LAB — POCT GLYCOSYLATED HEMOGLOBIN (HGB A1C): Hemoglobin A1C: 7.5

## 2014-09-21 MED ORDER — CANAGLIFLOZIN 100 MG PO TABS: 100.0000 mg | ORAL_TABLET | Freq: Every day | ORAL | Status: DC

## 2014-09-21 MED ORDER — GABAPENTIN 300 MG PO CAPS: 300.0000 mg | ORAL_CAPSULE | Freq: Four times a day (QID) | ORAL | Status: DC

## 2014-09-21 NOTE — Patient Instructions (Signed)
Call us in two wks with fasting sugar levels

## 2014-09-21 NOTE — Progress Notes (Signed)
   Subjective:    Patient ID: Jared Griffin, male    DOB: [birth year], 54 y.o.   MRN: [id number]  Diabetes He presents for his follow-up diabetic visit. He has type 2 diabetes mellitus. Risk factors for coronary artery disease include dyslipidemia, diabetes mellitus and hypertension. Current diabetic treatment includes oral agent (dual therapy). He is compliant with treatment all of the time. He is following a diabetic diet. He has not had a previous visit with a dietitian. He never participates in exercise. He sees a podiatrist.Eye exam is not current.   Results for orders placed or performed in visit on 09/21/14  POCT glycosylated hemoglobin (Hb A1C)  Result Value Ref Range   Hemoglobin A1C 7.5    Sugars hi in the morn, 130 to 175  Had bilat surgery, and had to come off the plavix off before suregery  Seen with surg  Had bilat morton's neuropathy. On further history the patient's discomfort and burning is most focused in the region of his surgery bilateral.  Nerve conduction studeies showed mild peripheral neuropathy. Recent bilateral foot surgery  Review of Systems No headache no chest pain no back pain abdominal pain no change in bowel habits no blood in stool ROS otherwise negative    Objective:   Physical Exam  Alert mild malaise vitals stable. H&T normal. Lungs clear heart rare rhythm ankles without edema feet bilateral scars from recent Morton's neuroma surgery. Distal sensation diminished in them. Surgical region but not in the small toe or great toe. Pulses good.      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 Discussed. Possibly exacerbated by diabetes. #4 hypertension decent control Plan add info Connor. Rationale discussed. Also increase Neurontin. Rationale also discussed. Was just on 2 tablets a day and not achieving good  pain control. 25 minutes spent most in discussion follow-up as scheduled. WSL

## 2014-09-22 ENCOUNTER — Other Ambulatory Visit: Payer: Self-pay | Admitting: Family Medicine

## 2014-09-30 ENCOUNTER — Encounter: Payer: Self-pay | Admitting: Family Medicine

## 2014-10-01 ENCOUNTER — Other Ambulatory Visit: Payer: Self-pay | Admitting: *Deleted

## 2014-10-01 MED ORDER — CANAGLIFLOZIN 300 MG PO TABS: 300.0000 mg | ORAL_TABLET | Freq: Every day | ORAL | Status: DC

## 2014-10-05 ENCOUNTER — Telehealth: Payer: Self-pay | Admitting: Cardiovascular Disease

## 2014-10-05 NOTE — Telephone Encounter (Signed)
Patient c/o having a sharp, fast pain, with some feelings of heaviness at times. Patient rated his chest pain a 3 on scale of 1-10, (10 being the greatest). Patient said the pain wasn't bad enough to use nitroglycerin. No c/o sob, but said he did have some dizziness. Patient was concerned that it could be coming from a new medication he started called invokana. Nurse advised patient that he should contact his PCP r/e side effects of the new medication. Nurse also advised patient to use his nitroglycerin for severe chest pain and if his symptoms got worse, to proceed to the ED for an evaluation. Patient was given an appointment on 10/07/14. Patient verbalized understanding of plan.

## 2014-10-05 NOTE — Telephone Encounter (Signed)
Pt wife says she is returning call for nurse that had left message after talking to pt this morning. Wife says pt has not had to take nitroglycerin and that is no longer pain just some occasional heaviness. Will forward. Wife says if need to call again call work cell [phone].

## 2014-10-05 NOTE — Telephone Encounter (Signed)
canagliflozin (INVOKANA) 300 MG TABS tablet  Patient has been experiencing slight un easy feeling in chest area since starting this medication.  Would like to know if he should continue taking this medication or not  Work cell#  [phone]

## 2014-10-06 ENCOUNTER — Ambulatory Visit (INDEPENDENT_AMBULATORY_CARE_PROVIDER_SITE_OTHER): Payer: PRIVATE HEALTH INSURANCE | Admitting: Family Medicine

## 2014-10-06 ENCOUNTER — Encounter: Payer: Self-pay | Admitting: Family Medicine

## 2014-10-06 VITALS — BP 104/78 | Temp 98.6°F | Ht 74.0 in | Wt 239.2 lb

## 2014-10-06 DIAGNOSIS — J019 Acute sinusitis, unspecified: Secondary | ICD-10-CM

## 2014-10-06 DIAGNOSIS — R079 Chest pain, unspecified: Secondary | ICD-10-CM

## 2014-10-06 DIAGNOSIS — H8110 Benign paroxysmal vertigo, unspecified ear: Secondary | ICD-10-CM

## 2014-10-06 DIAGNOSIS — E1143 Type 2 diabetes mellitus with diabetic autonomic (poly)neuropathy: Secondary | ICD-10-CM

## 2014-10-06 MED ORDER — MECLIZINE HCL 25 MG PO TABS: 25.0000 mg | ORAL_TABLET | Freq: Three times a day (TID) | ORAL | Status: DC | PRN

## 2014-10-06 MED ORDER — AMOXICILLIN-POT CLAVULANATE 875-125 MG PO TABS: 1.0000 | ORAL_TABLET | Freq: Two times a day (BID) | ORAL | Status: AC

## 2014-10-06 NOTE — Progress Notes (Signed)
   Subjective:    Patient ID: Jared Griffin, male    DOB: 01/15/1961, 54 y.o.   MRN: [id number]  Sinusitis This is a new problem. The current episode started in the past 7 days. The problem is unchanged. There has been no fever. The pain is moderate. Associated symptoms include congestion, headaches and sinus pressure. (Dizziness) Past treatments include oral decongestants. The treatment provided no relief.   Patient states that he has been having chest pain and pressure also. Most of the chest pain has been left-sided. Sharp transient just for a few seconds. However last evening has sustained burning left anterior chest. No nausea no diaphoresis no shortness of breath. Patient is suppose to see his heart doctor tomorrow.   Headache pfessure and cong and drainage  Nose runny today  Sugars no sig change 157 or so ,  Having some disc at times left to right sharp in nature  Last ntie burning sens spinning sens last night, dizzy and unsteady  Substantial dizziness last night spinning sensation. Lasted an hour next  Notes sugars starting to improve but still not ideal alert  Review of Systems  HENT: Positive for congestion and sinus pressure.   Neurological: Positive for headaches.       Objective:   Physical Exam  Alert mild malaise vital stable HET moderate his congestion frontal tenderness frank normal neck supple. Lungs clear heart regular in rhythm chest wall nontender next  EKG sinus tachycardia no significant ST-T changes      Assessment & Plan:  Impression 1 acute rhinosinusitis #2 vertigo secondary to 1 #3 suboptimum diabetes control increasing medicine today #4 chest pain atypical with stable EKG plan Antivert every 6 when necessary. Antibiotics prescribed. Local measures discussed. Increase invokana as recommended follow-up with cardiologist as scheduled

## 2014-10-07 ENCOUNTER — Ambulatory Visit (INDEPENDENT_AMBULATORY_CARE_PROVIDER_SITE_OTHER): Payer: PRIVATE HEALTH INSURANCE | Admitting: Cardiovascular Disease

## 2014-10-07 ENCOUNTER — Encounter: Payer: Self-pay | Admitting: Cardiovascular Disease

## 2014-10-07 VITALS — BP 123/85 | HR 101 | Ht 74.0 in | Wt 242.0 lb

## 2014-10-07 DIAGNOSIS — E782 Mixed hyperlipidemia: Secondary | ICD-10-CM

## 2014-10-07 DIAGNOSIS — R000 Tachycardia, unspecified: Secondary | ICD-10-CM

## 2014-10-07 DIAGNOSIS — I10 Essential (primary) hypertension: Secondary | ICD-10-CM

## 2014-10-07 DIAGNOSIS — I25118 Atherosclerotic heart disease of native coronary artery with other forms of angina pectoris: Secondary | ICD-10-CM

## 2014-10-07 DIAGNOSIS — R079 Chest pain, unspecified: Secondary | ICD-10-CM

## 2014-10-07 MED ORDER — METOPROLOL TARTRATE 25 MG PO TABS: 12.5000 mg | ORAL_TABLET | Freq: Two times a day (BID) | ORAL | Status: DC

## 2014-10-07 NOTE — Patient Instructions (Signed)
Your physician wants you to follow-up in: 6 months with Dr. Virgina Jock will receive a reminder letter in the mail two months in advance. If you don't receive a letter, please call our office to schedule the follow-up appointment.  Your physician has recommended you make the following change in your medication:   START METOPROLOL 12.5 MG TWICE DAILY  CONTINUE ALL OTHER MEDICATIONS AS DIRECTED  Thank you for choosing [facility]!!

## 2014-10-07 NOTE — Progress Notes (Signed)
Patient ID: Jared Griffin, male   DOB: 04-May-1961, 54 y.o.   MRN: [id number]      SUBJECTIVE: The patient is a 54 yr old male with a h/o CAD with LAD PCI in 3/15, HTN, hyperlipidemia, diabetes, and GERD. He was diagnosed yesterday with acute rhinosinusitis and associated vertigo. He had some atypical chest pain and a recent ECG demonstrated normal sinus rhythm with no ischemic ST-T abnormalities. He has been having a constant chest tightness and thinks it may be related to stress from work, but wanted to be certain.  Soc: Works at [employer]. Married to [place]ford, also my patient.  Review of Systems: As per "subjective", otherwise negative.  No Known Allergies  Current Outpatient Prescriptions  Medication Sig Dispense Refill  . ACCU-CHEK AVIVA PLUS test strip CHECK BLOOD SUGAR UP TO 4 TIMES A DAY 100 each 5  . acetaminophen (TYLENOL) 500 MG tablet Take 1,000 mg by mouth daily as needed for headache. Pain.    Marland Kitchen amoxicillin-clavulanate (AUGMENTIN) 875-125 MG per tablet Take 1 tablet by mouth 2 (two) times daily. 20 tablet 0  . aspirin (ASPIR-LOW) 81 MG EC tablet Take 81 mg by mouth daily.     Marland Kitchen atorvastatin (LIPITOR) 40 MG tablet TAKE ONE (1) TABLET BY MOUTH EVERY DAY 30 tablet 4  . azelastine (ASTELIN) 0.1 % nasal spray Place 2 sprays into both nostrils 2 (two) times daily. Use in each nostril as directed 30 mL 5  . canagliflozin (INVOKANA) 300 MG TABS tablet Take 300 mg by mouth daily before breakfast. 30 tablet 2  . clopidogrel (PLAVIX) 75 MG tablet Take 1 tablet (75 mg total) by mouth daily with breakfast. 30 tablet 11  . enalapril (VASOTEC) 10 MG tablet TAKE ONE (1) TABLET AT BEDTIME 30 tablet 0  . gabapentin (NEURONTIN) 300 MG capsule Take 1 capsule (300 mg total) by mouth 4 (four) times daily. 120 capsule 5  . glipiZIDE (GLUCOTROL) 5 MG tablet Take 2 tablets (10 mg total) by mouth 2 (two) times daily before a meal. 120 tablet 5  . HYDROcodone-acetaminophen (NORCO) 7.5-325 MG  per tablet Take 1 tablet by mouth every 4 (four) hours as needed for moderate pain.    . meclizine (ANTIVERT) 25 MG tablet Take 1 tablet (25 mg total) by mouth 3 (three) times daily as needed for dizziness or nausea. 30 tablet 1  . metFORMIN (GLUCOPHAGE) 500 MG tablet TAKE 2 TABLETS EVERY MORNING , TAKE 1 TABLET AT NOON, AND TAKE 2 TABLETS EVERYEVENING 450 tablet 1  . nitroGLYCERIN (NITROSTAT) 0.4 MG SL tablet Place 1 tablet (0.4 mg total) under the tongue every 5 (five) minutes as needed for chest pain. 25 tablet 3  . ranitidine (ZANTAC) 150 MG tablet Take 150 mg by mouth daily as needed for heartburn.     No current facility-administered medications for this visit.    Past Medical History  Diagnosis Date  . Essential hypertension, benign   . Mixed hyperlipidemia 2006  . 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 Arthritis   . Fatty liver disease, nonalcoholic   . Coronary atherosclerosis of native coronary artery     a. 12/09/2013: s/p PCI with 3.0 x 28 Promus DES to mLAD  . Osgood-Schlatter's disease of right knee   . Type 2 diabetes mellitus   . Obesity     Past Surgical History  Procedure Laterality Date  . Liver biopsy    . Knee arthroplasty Right 1999    "  bone growing on top of kneecap" (12/09/2013)  . Colonoscopy  06/19/2012    Procedure: COLONOSCOPY;  Surgeon: [name], MD;  Location: [facility];  Service: Endoscopy;  Laterality: N/A;  930  . Cardiac catheterization  2011  . Coronary angioplasty with stent placement  12/09/2013    "1"  . Cholecystectomy  2003  . Tonsillectomy  1970's  . Left heart catheterization with coronary angiogram N/A 12/09/2013    Procedure: LEFT HEART CATHETERIZATION WITH CORONARY ANGIOGRAM;  Surgeon: [name], MD;  Location: [facility] CATH LAB;  Service: Cardiovascular;  Laterality: N/A;    History   Social History  . Marital Status: Married    Spouse Name: N/A    Number of Children: N/A  . Years of Education: N/A    Occupational History  . Not on file.   Social History Main Topics  . Smoking status: Former Smoker -- 0.50 packs/day for 10 years    Types: Cigarettes    Start date: 02/02/1973    Quit date: 09/17/1983  . Smokeless tobacco: Former Systems developer    Types: [place] date: 09/17/1983  . Alcohol Use: No  . Drug Use: No  . Sexual Activity: Yes   Other Topics Concern  . 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    Married with 1 child   Right handed    12 th    Very little caffeine     Filed Vitals:   10/07/14 1452  BP: 123/85  Pulse: 101  Height: 6' 2"  (1.88 m)  Weight: 242 lb (109.77 kg)    PHYSICAL EXAM General: NAD HEENT: Normal. Neck: No JVD, no thyromegaly. Lungs: Clear to auscultation bilaterally with normal respiratory effort. CV: Nondisplaced PMI.  Rate mildly tachycardic, regular rhythm, normal S1/S2, no S3/S4, no murmur. No pretibial or periankle edema.  No carotid bruit.  Normal pedal pulses.  Abdomen: Soft, nontender, no hepatosplenomegaly, no distention.  Neurologic: Alert and oriented x 3.  Psych: Normal affect. Skin: Normal. Musculoskeletal: Normal range of motion, no gross deformities. Extremities: No clubbing or cyanosis.   ECG: Most recent ECG reviewed.      ASSESSMENT AND PLAN: 1. Chest pain with h/o CAD and LAD stent in 3/15: Chest pain atypical for CAD as it is constant. Coronary angiography demonstrated remaining epicardial vessels to be widely patient. HR mildly elevated which may be contributing to symptoms. Will start low-dose metoprolol 12.5 mg bid. Continue ASA, Plavix, and Lipitor. 2. Essential HTN: Well controlled on enalapril. No changes. 3. Hyperlipidemia: On Lipitor with good control on 02/22/14. No changes.  Dispo: f/u 6 months.  Kate Sable, M.D., F.A.C.C.

## 2014-10-12 ENCOUNTER — Telehealth: Payer: Self-pay | Admitting: *Deleted

## 2014-10-12 MED ORDER — DILTIAZEM HCL 30 MG PO TABS: 30.0000 mg | ORAL_TABLET | Freq: Two times a day (BID) | ORAL | Status: DC

## 2014-10-12 NOTE — Telephone Encounter (Signed)
Wife [name]) notified.  Will send new rx to [facility] & add to allergy list.

## 2014-10-12 NOTE — Telephone Encounter (Signed)
Wife [name]) calling - husband seen on 10/07/2014 & started on Metoprolol.  Started with rash under arm pit going around to front side of the chest.the very next day.  Had taken 2 doses of new med prior to that.  Dark red rash, only burns.  No blisters.  Has not changed deodorants, detergents, or perfumes.  Informed wife that message will be sent to provider for advice.

## 2014-10-12 NOTE — Telephone Encounter (Signed)
Please add to allergy list. Switch to diltiazem 30 mg bid.

## 2014-10-17 ENCOUNTER — Other Ambulatory Visit: Payer: Self-pay | Admitting: Family Medicine

## 2014-12-07 ENCOUNTER — Telehealth: Payer: Self-pay | Admitting: Cardiovascular Disease

## 2014-12-07 NOTE — Telephone Encounter (Signed)
Patient needs to know if he is to continue taking Plavix and Diltiavem. If so, he needs refills for Plavix.  Please contact patient or wife [name])

## 2014-12-07 NOTE — Telephone Encounter (Signed)
Can stop Plavix at end of March and take ASA 81 mg daily. Would continue diltiazem for now as he was tachycardic. Will reassess at next visit.

## 2014-12-07 NOTE — Telephone Encounter (Signed)
Please confirm when patient can stop his Plavix.  One year will be 12/10/2014.

## 2014-12-07 NOTE — Telephone Encounter (Signed)
Wife [name]) notified.  Stated that he has about 12 tablets of the Plavix left.  Advised her that he could go ahead & finish that current supply, then stop.  He should continue the Aspirin 73m daily and Diltiazem 347mtwice a day until next office visit.  Dr. KoBronson Ingill reassess the need for this medication at that time.  She verbalized understanding.

## 2014-12-14 ENCOUNTER — Other Ambulatory Visit: Payer: Self-pay | Admitting: Family Medicine

## 2014-12-23 ENCOUNTER — Ambulatory Visit: Payer: PRIVATE HEALTH INSURANCE | Admitting: Family Medicine

## 2014-12-31 ENCOUNTER — Other Ambulatory Visit: Payer: Self-pay | Admitting: Interventional Cardiology

## 2015-01-05 ENCOUNTER — Other Ambulatory Visit: Payer: Self-pay | Admitting: Family Medicine

## 2015-01-15 ENCOUNTER — Other Ambulatory Visit: Payer: Self-pay | Admitting: Family Medicine

## 2015-01-21 LAB — HM DIABETES EYE EXAM

## 2015-01-24 ENCOUNTER — Ambulatory Visit (INDEPENDENT_AMBULATORY_CARE_PROVIDER_SITE_OTHER): Payer: PRIVATE HEALTH INSURANCE | Admitting: Family Medicine

## 2015-01-24 ENCOUNTER — Encounter: Payer: Self-pay | Admitting: Family Medicine

## 2015-01-24 VITALS — BP 102/74 | Ht 74.0 in | Wt 234.0 lb

## 2015-01-24 DIAGNOSIS — E119 Type 2 diabetes mellitus without complications: Secondary | ICD-10-CM | POA: Diagnosis not present

## 2015-01-24 DIAGNOSIS — Z79899 Other long term (current) drug therapy: Secondary | ICD-10-CM

## 2015-01-24 DIAGNOSIS — I10 Essential (primary) hypertension: Secondary | ICD-10-CM

## 2015-01-24 DIAGNOSIS — F32A Depression, unspecified: Secondary | ICD-10-CM | POA: Insufficient documentation

## 2015-01-24 DIAGNOSIS — F329 Major depressive disorder, single episode, unspecified: Secondary | ICD-10-CM | POA: Insufficient documentation

## 2015-01-24 DIAGNOSIS — E785 Hyperlipidemia, unspecified: Secondary | ICD-10-CM | POA: Diagnosis not present

## 2015-01-24 DIAGNOSIS — Z125 Encounter for screening for malignant neoplasm of prostate: Secondary | ICD-10-CM

## 2015-01-24 DIAGNOSIS — K219 Gastro-esophageal reflux disease without esophagitis: Secondary | ICD-10-CM | POA: Diagnosis not present

## 2015-01-24 LAB — POCT GLYCOSYLATED HEMOGLOBIN (HGB A1C): Hemoglobin A1C: 7.1

## 2015-01-24 MED ORDER — METFORMIN HCL 500 MG PO TABS: ORAL_TABLET | ORAL | Status: DC

## 2015-01-24 MED ORDER — DILTIAZEM HCL 30 MG PO TABS: 30.0000 mg | ORAL_TABLET | Freq: Two times a day (BID) | ORAL | Status: DC

## 2015-01-24 MED ORDER — PANTOPRAZOLE SODIUM 40 MG PO TBEC: 40.0000 mg | DELAYED_RELEASE_TABLET | Freq: Every day | ORAL | Status: DC

## 2015-01-24 MED ORDER — ALPRAZOLAM 0.5 MG PO TABS: 0.5000 mg | ORAL_TABLET | Freq: Every day | ORAL | Status: DC | PRN

## 2015-01-24 MED ORDER — GLIPIZIDE 5 MG PO TABS: ORAL_TABLET | ORAL | Status: DC

## 2015-01-24 MED ORDER — ENALAPRIL MALEATE 10 MG PO TABS: ORAL_TABLET | ORAL | Status: DC

## 2015-01-24 MED ORDER — BUPROPION HCL ER (SR) 150 MG PO TB12: ORAL_TABLET | ORAL | Status: DC

## 2015-01-24 MED ORDER — CANAGLIFLOZIN 300 MG PO TABS: ORAL_TABLET | ORAL | Status: DC

## 2015-01-24 MED ORDER — GABAPENTIN 300 MG PO CAPS: 300.0000 mg | ORAL_CAPSULE | Freq: Four times a day (QID) | ORAL | Status: DC

## 2015-01-24 NOTE — Progress Notes (Signed)
   Subjective:    Patient ID: Jared Griffin, male    DOB: 1961-01-16, 54 y.o.   MRN: [id number] Patient arrives office with a tremendous number of concerns Diabetes He presents for his follow-up diabetic visit. He has type 2 diabetes mellitus. His disease course has been stable. There are no hypoglycemic associated symptoms. There are no diabetic associated symptoms. There are no hypoglycemic complications. Symptoms are stable. There are no diabetic complications. There are no known risk factors for coronary artery disease. Current diabetic treatment includes oral agent (dual therapy). He is compliant with treatment all of the time.   Patient wants to be referred to Dr. Laural Golden for a hernia.. On further history he has known history of hiatal hernia. Has had more more reflux symptoms lately. Had a spasm-like episode after a large meal last week. No true blockage of food.  Results for orders placed or performed in visit on 01/24/15  POCT glycosylated hemoglobin (Hb A1C)  Result Value Ref Range   Hemoglobin A1C 7.1     Patient states that he needs some Xanax prescribed for stress. Ongoing challenges. Worsening recently. Whites chronic illness. Troubles at work and his own chronic health issues are starting to take a strain. Feels down and depressed often no suicidal thoughts. Very stressed very anxious. Very irritable some fatigue with this  Ongoing challenges with neuropathic pain. Due to see podiatrist soon.  Compliant with lipid medicine. No obvious side effects. Trying to cut that down and diet. Next  Unfortunately not exercising much  Eye doc visit no diab prob   Review of Systems No headache no chest pain no back pain no change in bowel habits no blood in stool    Objective:   Physical Exam Alert vitals stable blood pressure good on repeat. HEENT normal. Lungs clear heart regular in rhythm. Abdomen no particular tenderness ankles without edema feet sensation intact some  hypersensitivity currently pulses good       Assessment & Plan:  Impression 1 hyperlipidemia status uncertain type 2 diabetes good control but not perfect discussed #300 hypertension controlled good #4 coronary artery disease stable #5 worsening reflux discussed at length #6 depression/anxiety discussed at length plan exercise encourage. Appropriate blood work. Diet exercise discussed. Add Protonix daily. Add Wellbutrin daily. Add Xanax when necessary. 35-40 minutes spent most in discussion WSL

## 2015-01-26 ENCOUNTER — Telehealth: Payer: Self-pay | Admitting: Family Medicine

## 2015-01-26 NOTE — Telephone Encounter (Signed)
Rx prior auth APPROVED for pt's buPROPion [facility] SR) 150 MG 12 hr tablet, valid 01/24/15-01/24/16 through pt's [employer], case # [id number], faxed approval to [facility]

## 2015-02-13 LAB — BASIC METABOLIC PANEL
BUN/Creatinine Ratio: 18 (ref 9–20)
BUN: 13 mg/dL (ref 6–24)
CO2: 22 mmol/L (ref 18–29)
Calcium: 9.5 mg/dL (ref 8.7–10.2)
Chloride: 98 mmol/L (ref 97–108)
Creatinine, Ser: 0.72 mg/dL — ABNORMAL LOW (ref 0.76–1.27)
GFR calc Af Amer: 123 mL/min/{1.73_m2} (ref >59)
GFR calc non Af Amer: 107 mL/min/{1.73_m2} (ref >59)
Glucose: 204 mg/dL — ABNORMAL HIGH (ref 65–99)
Potassium: 4.1 mmol/L (ref 3.5–5.2)
Sodium: 141 mmol/L (ref 134–144)

## 2015-02-13 LAB — HEPATIC FUNCTION PANEL
ALT: 32 IU/L (ref 0–44)
AST: 19 IU/L (ref 0–40)
Albumin: 4.7 g/dL (ref 3.5–5.5)
Alkaline Phosphatase: 40 IU/L (ref 39–117)
Bilirubin Total: 0.8 mg/dL (ref 0.0–1.2)
Bilirubin, Direct: 0.25 mg/dL (ref 0.00–0.40)
Total Protein: 6.9 g/dL (ref 6.0–8.5)

## 2015-02-13 LAB — PSA: Prostate Specific Ag, Serum: 0.3 ng/mL (ref 0.0–4.0)

## 2015-02-13 LAB — LIPID PANEL
Chol/HDL Ratio: 4 ratio units (ref 0.0–5.0)
Cholesterol, Total: 123 mg/dL (ref 100–199)
HDL: 31 mg/dL — ABNORMAL LOW (ref >39)
LDL Calculated: 39 mg/dL (ref 0–99)
Triglycerides: 266 mg/dL — ABNORMAL HIGH (ref 0–149)
VLDL Cholesterol Cal: 53 mg/dL — ABNORMAL HIGH (ref 5–40)

## 2015-02-13 LAB — MICROALBUMIN, URINE: Microalbumin, Urine: 4.3 ug/mL

## 2015-02-14 ENCOUNTER — Encounter: Payer: Self-pay | Admitting: Family Medicine

## 2015-02-15 ENCOUNTER — Ambulatory Visit (INDEPENDENT_AMBULATORY_CARE_PROVIDER_SITE_OTHER): Payer: PRIVATE HEALTH INSURANCE | Admitting: Family Medicine

## 2015-02-15 ENCOUNTER — Encounter: Payer: Self-pay | Admitting: Family Medicine

## 2015-02-15 VITALS — BP 132/90 | Temp 98.3°F | Ht 74.0 in | Wt 241.2 lb

## 2015-02-15 DIAGNOSIS — H6093 Unspecified otitis externa, bilateral: Secondary | ICD-10-CM

## 2015-02-15 DIAGNOSIS — G4700 Insomnia, unspecified: Secondary | ICD-10-CM | POA: Diagnosis not present

## 2015-02-15 MED ORDER — CIPROFLOXACIN HCL 500 MG PO TABS: 500.0000 mg | ORAL_TABLET | Freq: Two times a day (BID) | ORAL | Status: DC

## 2015-02-15 MED ORDER — OFLOXACIN 0.3 % OT SOLN: 4.0000 [drp] | Freq: Two times a day (BID) | OTIC | Status: DC

## 2015-02-15 MED ORDER — TEMAZEPAM 30 MG PO CAPS: 30.0000 mg | ORAL_CAPSULE | Freq: Every evening | ORAL | Status: DC | PRN

## 2015-02-15 NOTE — Progress Notes (Signed)
   Subjective:    Patient ID: Jared Griffin, male    DOB: 10-Sep-1961, 53 y.o.   MRN: [id number]  HPI  Patient has ear pain to bilateral ears. Onset of symptoms three days ago with some congestion. Patient stated that he has tried sweet oil in bilateral ears with some relief. Patient would also like to discuss Xanax medication. Patient states that Xanax is no longer working for him. Patient has no other concerns this visit.  Ongoing substantial stress.  No major congestion drainage. No major water exposure. Minimal use of earplugs Review of Systems Otherwise negative    Objective:   Physical Exam  Alert external inflammation crustiness right greater than left some preauricular tenderness on right pharynx normal. Lungs clear heart regular in rhythm.      Assessment & Plan:  Impression bilateral external otitis right greater than left #2 insomnia situational and exogenous. Not responding the 0.5 Xanax. Plan temazepam 30 mg daily at bedtime when necessary. Cipro twice a day 10 days. Floxin drops twice a day 5 days. WSL

## 2015-02-25 ENCOUNTER — Other Ambulatory Visit: Payer: Self-pay | Admitting: Family Medicine

## 2015-04-13 ENCOUNTER — Ambulatory Visit (INDEPENDENT_AMBULATORY_CARE_PROVIDER_SITE_OTHER): Payer: PRIVATE HEALTH INSURANCE | Admitting: Cardiovascular Disease

## 2015-04-13 ENCOUNTER — Encounter: Payer: Self-pay | Admitting: Cardiovascular Disease

## 2015-04-13 VITALS — BP 118/82 | HR 91 | Ht 74.0 in | Wt 235.0 lb

## 2015-04-13 DIAGNOSIS — I10 Essential (primary) hypertension: Secondary | ICD-10-CM | POA: Diagnosis not present

## 2015-04-13 DIAGNOSIS — E782 Mixed hyperlipidemia: Secondary | ICD-10-CM | POA: Diagnosis not present

## 2015-04-13 DIAGNOSIS — Z7182 Exercise counseling: Secondary | ICD-10-CM

## 2015-04-13 DIAGNOSIS — Z719 Counseling, unspecified: Secondary | ICD-10-CM

## 2015-04-13 DIAGNOSIS — I2510 Atherosclerotic heart disease of native coronary artery without angina pectoris: Secondary | ICD-10-CM | POA: Diagnosis not present

## 2015-04-13 DIAGNOSIS — Z713 Dietary counseling and surveillance: Secondary | ICD-10-CM

## 2015-04-13 NOTE — Patient Instructions (Signed)
Your physician wants you to follow-up in: 1 year with Dr. Bronson Ing.  You will receive a reminder letter in the mail two months in advance. If you don't receive a letter, please call our office to schedule the follow-up appointment.  Your physician recommends that you continue on your current medications as directed. Please refer to the Current Medication list given to you today.  Thank you for choosing [facility]!

## 2015-04-13 NOTE — Progress Notes (Signed)
Patient ID: Jared Griffin, male   DOB: Jul 18, 1961, 54 y.o.   MRN: [id number]      SUBJECTIVE: The patient is a 54 yr old male with a h/o CAD with LAD PCI in 11/2013, hypertension, hyperlipidemia, diabetes, and GERD.  Denies chest pain, palpitations, leg swelling, and shortness of breath. Says breakfast is his weakest area but tries not to eat too much bacon (TG mildly elevated by recent lipid panel as noted below). Has feet neuropathy and thus has not been exercising as he should.   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 Married to [place], also my patient.   Review of Systems: As per "subjective", otherwise negative.  Allergies  Allergen Reactions  . Metoprolol Rash    Current Outpatient Prescriptions  Medication Sig Dispense Refill  . ACCU-CHEK AVIVA PLUS test strip CHECK BLOOD SUGAR UP TO 4 TIMES A DAY 100 each 5  . acetaminophen (TYLENOL) 500 MG tablet Take 1,000 mg by mouth daily as needed for headache. Pain.    Marland Kitchen aspirin (ASPIR-LOW) 81 MG EC tablet Take 81 mg by mouth daily.     Marland Kitchen atorvastatin (LIPITOR) 40 MG tablet TAKE ONE (1) TABLET BY MOUTH EVERY DAY 30 tablet 6  . azelastine (ASTELIN) 0.1 % nasal spray Place 2 sprays into both nostrils 2 (two) times daily. Use in each nostril as directed 30 mL 5  . buPROPion (WELLBUTRIN SR) 150 MG 12 hr tablet TAKE ONE TABLET BY MOUTH FOR 3 DAYS; THEN TAKE ONE TABLET TWICE A DAYTHEREAFTER 60 tablet 5  . canagliflozin (INVOKANA) 300 MG TABS tablet TAKE ONE (1) TABLET EACH DAY BEFORE BREAKFAST 30 tablet 5  . diltiazem (CARDIZEM) 30 MG tablet Take 1 tablet (30 mg total) by mouth 2 (two) times daily. 60 tablet 5  . enalapril (VASOTEC) 10 MG tablet TAKE ONE (1) TABLET AT BEDTIME 30 tablet 5  . gabapentin (NEURONTIN) 300 MG capsule Take 1 capsule (300 mg total) by mouth 4 (four) times daily. 120 capsule 5  . glipiZIDE (GLUCOTROL) 5 MG tablet TAKE ONE TABLET TWICE DAILY BEFORE A MEAL 60 tablet 5  . HYDROcodone-acetaminophen (NORCO)  7.5-325 MG per tablet Take 1 tablet by mouth every 4 (four) hours as needed for moderate pain.    . meclizine (ANTIVERT) 25 MG tablet Take 1 tablet (25 mg total) by mouth 3 (three) times daily as needed for dizziness or nausea. 30 tablet 1  . metFORMIN (GLUCOPHAGE) 500 MG tablet TAKE 2 TABLETS EVERY MORNING , TAKE 1 TABLET AT NOON, AND TAKE 2 TABLETS EVERYEVENING 450 tablet 1  . nitroGLYCERIN (NITROSTAT) 0.4 MG SL tablet Place 1 tablet (0.4 mg total) under the tongue every 5 (five) minutes as needed for chest pain. 25 tablet 3  . pantoprazole (PROTONIX) 40 MG tablet Take 1 tablet (40 mg total) by mouth daily. 30 tablet 5  . ranitidine (ZANTAC) 150 MG tablet Take 150 mg by mouth daily as needed for heartburn.    . temazepam (RESTORIL) 30 MG capsule Take 1 capsule (30 mg total) by mouth at bedtime as needed for sleep. 30 capsule 0   No current facility-administered medications for this visit.    Past Medical History  Diagnosis Date  . Essential hypertension, benign   . Mixed hyperlipidemia 2006  . 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 Arthritis   . Fatty liver disease, nonalcoholic   . Coronary atherosclerosis of native coronary artery     a. 12/09/2013: s/p PCI  with 3.0 x 28 Promus DES to mLAD  . Osgood-Schlatter's disease of right knee   . Type 2 diabetes mellitus   . Obesity     Past Surgical History  Procedure Laterality Date  . Liver biopsy    . Knee arthroplasty Right 1999    "bone growing on top of kneecap" (12/09/2013)  . Colonoscopy  06/19/2012    Procedure: COLONOSCOPY;  Surgeon: [name], MD;  Location: [facility];  Service: Endoscopy;  Laterality: N/A;  930  . Cardiac catheterization  2011  . Coronary angioplasty with stent placement  12/09/2013    "1"  . Cholecystectomy  2003  . Tonsillectomy  1970's  . Left heart catheterization with coronary angiogram N/A 12/09/2013    Procedure: LEFT HEART CATHETERIZATION WITH CORONARY ANGIOGRAM;  Surgeon:  [name], MD;  Location: [facility] CATH LAB;  Service: Cardiovascular;  Laterality: N/A;    History   Social History  . Marital Status: Married    Spouse Name: N/A  . Number of Children: N/A  . Years of Education: N/A   Occupational History  . Not on file.   Social History Main Topics  . Smoking status: Former Smoker -- 0.50 packs/day for 10 years    Types: Cigarettes    Start date: 02/02/1973    Quit date: 09/17/1983  . Smokeless tobacco: Former Systems developer    Types: [place] date: 09/17/1983  . Alcohol Use: No  . Drug Use: No  . Sexual Activity: Yes   Other Topics Concern  . 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    Married with 1 child   Right handed    12 th    Very little caffeine     Filed Vitals:   04/13/15 1020  BP: 118/82  Pulse: 91  Height: 6' 2"  (1.88 m)  Weight: 235 lb (106.595 kg)  SpO2: 95%    PHYSICAL EXAM General: NAD HEENT: Normal. Neck: No JVD, no thyromegaly. Lungs: Clear to auscultation bilaterally with normal respiratory effort. CV: Nondisplaced PMI.  Regular rate and rhythm, normal S1/S2, no S3/S4, no murmur. No pretibial or periankle edema.  No carotid bruit.  Normal pedal pulses.  Abdomen: Soft, nontender, no hepatosplenomegaly, no distention.  Neurologic: Alert and oriented x 3.  Psych: Normal affect. Skin: Normal. Musculoskeletal: Normal range of motion, no gross deformities. Extremities: No clubbing or cyanosis.   ECG: Most recent ECG reviewed.      ASSESSMENT AND PLAN: 1. CAD and LAD stent in 11/2013: Symptomatically stable. No changes to therapy.  2. Essential HTN: Well controlled on enalapril. No changes.  3. Hyperlipidemia: On Lipitor with good control of LDL on 02/12/15. TG mildly elevated. Dietary and exercise counseling provided. No changes to medical therapy.  Dispo: f/u 1 year.   Kate Sable, M.D., F.A.C.C.

## 2015-04-20 ENCOUNTER — Other Ambulatory Visit: Payer: Self-pay | Admitting: Family Medicine

## 2015-04-20 MED ORDER — HYDROCODONE-ACETAMINOPHEN 7.5-325 MG PO TABS: 1.0000 | ORAL_TABLET | ORAL | Status: DC | PRN

## 2015-04-20 NOTE — Addendum Note (Signed)
Addended by: Jesusita Oka on: 04/20/2015 01:27 PM   Modules accepted: Orders

## 2015-04-20 NOTE — Telephone Encounter (Signed)
Please refill his HYDROcodone-acetaminophen (NORCO) 7.5-325 MG per tablet   Pt needs a refill on this med please, has appt 31 aug for med check   Please call when ready for pick up

## 2015-04-20 NOTE — Telephone Encounter (Signed)
Ok times one 

## 2015-04-20 NOTE — Telephone Encounter (Signed)
Patient was notified that script is ready for pickup.

## 2015-04-22 ENCOUNTER — Ambulatory Visit: Payer: PRIVATE HEALTH INSURANCE | Admitting: Family Medicine

## 2015-05-17 ENCOUNTER — Other Ambulatory Visit: Payer: Self-pay | Admitting: Family Medicine

## 2015-05-18 ENCOUNTER — Encounter: Payer: Self-pay | Admitting: Family Medicine

## 2015-05-18 ENCOUNTER — Ambulatory Visit (INDEPENDENT_AMBULATORY_CARE_PROVIDER_SITE_OTHER): Payer: PRIVATE HEALTH INSURANCE | Admitting: Family Medicine

## 2015-05-18 VITALS — BP 122/84 | Ht 74.0 in | Wt 236.0 lb

## 2015-05-18 DIAGNOSIS — G4700 Insomnia, unspecified: Secondary | ICD-10-CM | POA: Diagnosis not present

## 2015-05-18 DIAGNOSIS — E119 Type 2 diabetes mellitus without complications: Secondary | ICD-10-CM

## 2015-05-18 DIAGNOSIS — I10 Essential (primary) hypertension: Secondary | ICD-10-CM | POA: Diagnosis not present

## 2015-05-18 LAB — POCT GLYCOSYLATED HEMOGLOBIN (HGB A1C): Hemoglobin A1C: 6.7

## 2015-05-18 NOTE — Progress Notes (Signed)
   Subjective:    Patient ID: Jared Griffin, male    DOB: 08/12/1961, 54 y.o.   MRN: [id number]  Diabetes He presents for his follow-up diabetic visit. He has type 2 diabetes mellitus. He is following a diabetic diet. Exercise: walks three times a week. (120 - 160) He sees a podiatrist.Eye exam is current.  A1C 6.7  Results for orders placed or performed in visit on 05/18/15  POCT glycosylated hemoglobin (Hb A1C)  Result Value Ref Range   Hemoglobin A1C 6.7     Pt would like to discuss stopping wellbutrin. Pt taking one bid but states he hasn't noticied a change and would like to stop it. Patient notes stressors not as bad as it was.  Claims compliance with blood pressure medication. No obvious side effects. Has cut down salt intake. Next  Maintain compliance with lipid medicine. Watching diet closely. Next  Medications still helping sleep.  Still benefiting from combination meds for reflux symptomatology.  120 to 160 Review of Systems No headache no chest pain and back pain no abdominal pain no change in bowel habits no blood in stool    Objective:   Physical Exam  Alert vitals stable blood pressure good on repeat HEENT normal. Lungs clear. Heart rare rhythm. Ankles without edema.      Assessment & Plan:  Impression 1 type 2 diabetes good control #2 hyperlipidemia discussed #3 hypertension good control discussed #4 diabetic neuropathy discussed #5 Wellbutrin weaning discussed at patient request patient receiving injections which helped plan diet exercise discussed. Check in 6 months. Warning signs discussed. Flu shot encourage. Wean off Wellbutrin discussed

## 2015-06-01 ENCOUNTER — Telehealth: Payer: Self-pay | Admitting: Family Medicine

## 2015-06-01 MED ORDER — AZITHROMYCIN 250 MG PO TABS: ORAL_TABLET | ORAL | Status: DC

## 2015-06-01 NOTE — Telephone Encounter (Signed)
Has sinusitis sx cant come in wife request his med, follow up if worse

## 2015-06-22 ENCOUNTER — Encounter: Payer: Self-pay | Admitting: Nurse Practitioner

## 2015-06-22 ENCOUNTER — Ambulatory Visit (INDEPENDENT_AMBULATORY_CARE_PROVIDER_SITE_OTHER): Payer: PRIVATE HEALTH INSURANCE | Admitting: Nurse Practitioner

## 2015-06-22 VITALS — BP 110/76 | Temp 98.5°F | Ht 74.0 in | Wt 235.4 lb

## 2015-06-22 DIAGNOSIS — J0120 Acute ethmoidal sinusitis, unspecified: Secondary | ICD-10-CM

## 2015-06-22 MED ORDER — AMOXICILLIN-POT CLAVULANATE 875-125 MG PO TABS: 1.0000 | ORAL_TABLET | Freq: Two times a day (BID) | ORAL | Status: DC

## 2015-06-22 MED ORDER — METHYLPREDNISOLONE ACETATE 40 MG/ML IJ SUSP
40.0000 mg | Freq: Once | INTRAMUSCULAR | Status: AC
  Administered 2015-06-22: 40 mg via INTRAMUSCULAR

## 2015-06-22 NOTE — Patient Instructions (Signed)
Allegra or claritin nasacort AQ or rhinocort AQ

## 2015-06-22 NOTE — Progress Notes (Signed)
Subjective:  Presents complaints of intense ethmoid sinus area headache over the past several weeks. Was given a Z-Pak on 9/14, temporary relief but symptoms came back. No relief with OTC meds. Cough mainly in the morning. Producing light green sputum. No wheezing. No fever. No sore throat or ear pain.  Objective:   BP 110/76 mmHg  Temp(Src) 98.5 F (36.9 C) (Oral)  Ht 6' 2"  (1.88 m)  Wt 235 lb 6 oz (106.765 kg)  BMI 30.21 kg/m2 NAD. Alert, oriented. TMs clear effusion, no erythema. Pharynx mildly erythematous. Neck supple with mild soft anterior adenopathy. Lungs clear. Heart regular rate rhythm.  Assessment: Acute ethmoidal sinusitis, recurrence not specified - Plan: methylPREDNISolone acetate (DEPO-MEDROL) injection 40 mg  Plan:  Meds ordered this encounter  Medications  . amoxicillin-clavulanate (AUGMENTIN) 875-125 MG tablet    Sig: Take 1 tablet by mouth 2 (two) times daily.    Dispense:  20 tablet    Refill:  0    Order Specific Question:  Supervising Provider    Answer:  Mikey Kirschner [[id number]]  . methylPREDNISolone acetate (DEPO-MEDROL) injection 40 mg    Sig:    Call back in 48 hours if no improvement in pressure, consider adding oral steroid taper. Recommend daily antihistamine and steroid nasal spray as preventive measure.

## 2015-07-25 ENCOUNTER — Other Ambulatory Visit: Payer: Self-pay | Admitting: Family Medicine

## 2015-08-03 ENCOUNTER — Other Ambulatory Visit: Payer: Self-pay | Admitting: *Deleted

## 2015-08-03 MED ORDER — ATORVASTATIN CALCIUM 40 MG PO TABS: 40.0000 mg | ORAL_TABLET | Freq: Every day | ORAL | Status: DC

## 2015-08-09 ENCOUNTER — Other Ambulatory Visit: Payer: Self-pay | Admitting: Family Medicine

## 2015-08-19 ENCOUNTER — Telehealth: Payer: Self-pay | Admitting: Family Medicine

## 2015-08-19 ENCOUNTER — Other Ambulatory Visit: Payer: Self-pay | Admitting: Nurse Practitioner

## 2015-08-19 MED ORDER — AMOXICILLIN-POT CLAVULANATE 875-125 MG PO TABS: 1.0000 | ORAL_TABLET | Freq: Two times a day (BID) | ORAL | Status: DC

## 2015-08-19 NOTE — Telephone Encounter (Signed)
Notified Shea antibiotic called in. Office visit next week if no better. Go to ED or urgent care this weekend if worse.

## 2015-08-19 NOTE — Telephone Encounter (Signed)
Patient is achey, head stuffy, sudden loss of energy.  Patients wife was seen recently on 08/16/2015 with the same symptoms and Hoyle Sauer told her we would call in medication if Adler started running the same symptoms.     [place]

## 2015-08-19 NOTE — Telephone Encounter (Signed)
Antibiotic called in. Office visit next week if no better. Go to ED or urgent care this weekend if worse.

## 2015-08-24 ENCOUNTER — Other Ambulatory Visit: Payer: Self-pay | Admitting: Family Medicine

## 2015-08-31 ENCOUNTER — Other Ambulatory Visit: Payer: Self-pay | Admitting: Family Medicine

## 2015-09-02 ENCOUNTER — Encounter: Payer: Self-pay | Admitting: Family Medicine

## 2015-09-02 ENCOUNTER — Ambulatory Visit (INDEPENDENT_AMBULATORY_CARE_PROVIDER_SITE_OTHER): Payer: PRIVATE HEALTH INSURANCE | Admitting: Family Medicine

## 2015-09-02 VITALS — BP 130/80 | Temp 98.9°F | Ht 74.0 in | Wt 237.5 lb

## 2015-09-02 DIAGNOSIS — J019 Acute sinusitis, unspecified: Secondary | ICD-10-CM | POA: Diagnosis not present

## 2015-09-02 MED ORDER — CEFDINIR 300 MG PO CAPS
300.0000 mg | ORAL_CAPSULE | Freq: Two times a day (BID) | ORAL | Status: DC
Start: 2015-09-02 — End: 2015-09-20

## 2015-09-02 MED ORDER — HYDROCODONE-HOMATROPINE 5-1.5 MG/5ML PO SYRP: 5.0000 mL | ORAL_SOLUTION | Freq: Every evening | ORAL | Status: DC | PRN

## 2015-09-02 NOTE — Patient Instructions (Signed)
Meclizine two otc 12.5 three times per day equals the prescription

## 2015-09-02 NOTE — Progress Notes (Signed)
   Subjective:    Patient ID: Jared Griffin, male    DOB: [birth year], 54 y.o.   MRN: [id number]  Sinusitis This is a new problem. The current episode started in the past 7 days. The problem is unchanged. There has been no fever. The pain is moderate. Associated symptoms include congestion, coughing and headaches. (Body aches, chest pain, lightheadedness) Past treatments include antibiotics. The treatment provided no relief.    Patient complains of dizziness/lightheadedness also.  Started last sat and suund, achey and stuffy  Took some left over amox, went back to work, feeling a little better, got to coughing and achey, burnign in the throat  [place] thru out the night   Took the equate prn   Frontal h a , on wed got slightly dizzy, intermittent spells   Review of Systems  HENT: Positive for congestion.   Respiratory: Positive for cough.   Neurological: Positive for headaches.       Objective:   Physical Exam Alert mild malaise. Vitals stable HET moderate his congestion frontal tenderness pharynx normal neck supple lungs clear heart regular in rhythm.       Assessment & Plan:  Impression 1 acute rhinosinusitis plan antibiotics prescribed. Symptomatic care discussed warning signs discussed WSL

## 2015-09-09 ENCOUNTER — Other Ambulatory Visit: Payer: Self-pay | Admitting: Family Medicine

## 2015-09-15 ENCOUNTER — Other Ambulatory Visit: Payer: Self-pay | Admitting: *Deleted

## 2015-09-15 ENCOUNTER — Telehealth: Payer: Self-pay | Admitting: Family Medicine

## 2015-09-15 MED ORDER — CEFDINIR 300 MG PO CAPS: 300.0000 mg | ORAL_CAPSULE | Freq: Two times a day (BID) | ORAL | Status: DC

## 2015-09-15 NOTE — Telephone Encounter (Signed)
Med sent to pharm. Pt notified.  

## 2015-09-15 NOTE — Telephone Encounter (Signed)
Pt is needing more antibiotics to be called in. Pt was seen two weeks ago and is not much better.     [place] [facility]

## 2015-09-15 NOTE — Telephone Encounter (Signed)
omnicef 300 bid ten d

## 2015-09-15 NOTE — Telephone Encounter (Signed)
Seen 12/16 rhinosinusitis. Prescribed augmentin. Finsihed med. Still feels yucky. About 30% better. Having cough, sinus drainage, ear pain. No fever, no wheezing.

## 2015-09-20 ENCOUNTER — Telehealth: Payer: Self-pay | Admitting: *Deleted

## 2015-09-20 ENCOUNTER — Encounter (HOSPITAL_COMMUNITY)
Admission: EM | Disposition: A | Discharge status: Home / Self Care | Payer: Self-pay | Source: Home / Self Care | Attending: Cardiology

## 2015-09-20 ENCOUNTER — Inpatient Hospital Stay (HOSPITAL_COMMUNITY)
Admission: EM | Admit: 2015-09-20 | Discharge: 2015-09-21 | DRG: 287 | Disposition: A | Discharge status: Home / Self Care | Payer: PRIVATE HEALTH INSURANCE | Attending: Cardiology | Admitting: Cardiology

## 2015-09-20 ENCOUNTER — Emergency Department (HOSPITAL_COMMUNITY): Payer: PRIVATE HEALTH INSURANCE

## 2015-09-20 ENCOUNTER — Observation Stay (HOSPITAL_COMMUNITY): Payer: PRIVATE HEALTH INSURANCE

## 2015-09-20 ENCOUNTER — Encounter (HOSPITAL_COMMUNITY): Payer: Self-pay | Admitting: Emergency Medicine

## 2015-09-20 DIAGNOSIS — Z823 Family history of stroke: Secondary | ICD-10-CM | POA: Diagnosis not present

## 2015-09-20 DIAGNOSIS — M1990 Unspecified osteoarthritis, unspecified site: Secondary | ICD-10-CM | POA: Diagnosis present

## 2015-09-20 DIAGNOSIS — Z955 Presence of coronary angioplasty implant and graft: Secondary | ICD-10-CM | POA: Diagnosis not present

## 2015-09-20 DIAGNOSIS — I2510 Atherosclerotic heart disease of native coronary artery without angina pectoris: Secondary | ICD-10-CM

## 2015-09-20 DIAGNOSIS — E119 Type 2 diabetes mellitus without complications: Secondary | ICD-10-CM | POA: Diagnosis not present

## 2015-09-20 DIAGNOSIS — I25110 Atherosclerotic heart disease of native coronary artery with unstable angina pectoris: Principal | ICD-10-CM | POA: Diagnosis present

## 2015-09-20 DIAGNOSIS — E782 Mixed hyperlipidemia: Secondary | ICD-10-CM | POA: Diagnosis present

## 2015-09-20 DIAGNOSIS — Z7982 Long term (current) use of aspirin: Secondary | ICD-10-CM

## 2015-09-20 DIAGNOSIS — E785 Hyperlipidemia, unspecified: Secondary | ICD-10-CM | POA: Diagnosis not present

## 2015-09-20 DIAGNOSIS — R079 Chest pain, unspecified: Secondary | ICD-10-CM | POA: Diagnosis present

## 2015-09-20 DIAGNOSIS — R0789 Other chest pain: Secondary | ICD-10-CM | POA: Diagnosis not present

## 2015-09-20 DIAGNOSIS — I200 Unstable angina: Secondary | ICD-10-CM | POA: Diagnosis not present

## 2015-09-20 DIAGNOSIS — E1159 Type 2 diabetes mellitus with other circulatory complications: Secondary | ICD-10-CM

## 2015-09-20 DIAGNOSIS — Z79899 Other long term (current) drug therapy: Secondary | ICD-10-CM | POA: Diagnosis not present

## 2015-09-20 DIAGNOSIS — I10 Essential (primary) hypertension: Secondary | ICD-10-CM | POA: Diagnosis present

## 2015-09-20 DIAGNOSIS — Z9861 Coronary angioplasty status: Secondary | ICD-10-CM

## 2015-09-20 DIAGNOSIS — Z7984 Long term (current) use of oral hypoglycemic drugs: Secondary | ICD-10-CM

## 2015-09-20 HISTORY — PX: CARDIAC CATHETERIZATION: SHX172

## 2015-09-20 LAB — CBC
HCT: 45.6 % (ref 39.0–52.0)
Hemoglobin: 16.3 g/dL (ref 13.0–17.0)
MCH: 30.7 pg (ref 26.0–34.0)
MCHC: 35.7 g/dL (ref 30.0–36.0)
MCV: 85.9 fL (ref 78.0–100.0)
Platelets: 145 10*3/uL — ABNORMAL LOW (ref 150–400)
RBC: 5.31 MIL/uL (ref 4.22–5.81)
RDW: 13.1 % (ref 11.5–15.5)
WBC: 6.6 10*3/uL (ref 4.0–10.5)

## 2015-09-20 LAB — BASIC METABOLIC PANEL
Anion gap: 12 (ref 5–15)
BUN: 19 mg/dL (ref 6–20)
CO2: 23 mmol/L (ref 22–32)
Calcium: 9.7 mg/dL (ref 8.9–10.3)
Chloride: 103 mmol/L (ref 101–111)
Creatinine, Ser: 0.72 mg/dL (ref 0.61–1.24)
GFR calc Af Amer: >60 mL/min (ref >60)
GFR calc non Af Amer: >60 mL/min (ref >60)
Glucose, Bld: 255 mg/dL — ABNORMAL HIGH (ref 65–99)
Potassium: 4.1 mmol/L (ref 3.5–5.1)
Sodium: 138 mmol/L (ref 135–145)

## 2015-09-20 LAB — GLUCOSE, CAPILLARY
Glucose-Capillary: 107 mg/dL — ABNORMAL HIGH (ref 65–99)
Glucose-Capillary: 196 mg/dL — ABNORMAL HIGH (ref 65–99)

## 2015-09-20 LAB — TROPONIN I
Troponin I: <0.03 ng/mL (ref <0.031)
Troponin I: <0.03 ng/mL (ref <0.031)

## 2015-09-20 LAB — D-DIMER, QUANTITATIVE (NOT AT ARMC): D-Dimer, Quant: <0.27 ug/mL-FEU (ref 0.00–0.50)

## 2015-09-20 SURGERY — LEFT HEART CATH AND CORONARY ANGIOGRAPHY

## 2015-09-20 MED ORDER — IOHEXOL 350 MG/ML SOLN
INTRAVENOUS | Status: DC | PRN
  Administered 2015-09-20: 90 mL via INTRA_ARTERIAL

## 2015-09-20 MED ORDER — MORPHINE SULFATE (PF) 4 MG/ML IV SOLN
4.0000 mg | Freq: Once | INTRAVENOUS | Status: AC
  Administered 2015-09-20: 4 mg via INTRAVENOUS
  Filled 2015-09-20: qty 1

## 2015-09-20 MED ORDER — NITROGLYCERIN IN D5W 200-5 MCG/ML-% IV SOLN
INTRAVENOUS | Status: AC
  Administered 2015-09-20: 3 ug/min via INTRAVENOUS
  Filled 2015-09-20: qty 250

## 2015-09-20 MED ORDER — OXYCODONE-ACETAMINOPHEN 5-325 MG PO TABS
1.0000 | ORAL_TABLET | ORAL | Status: DC | PRN
  Administered 2015-09-20: 2 via ORAL
  Filled 2015-09-20: qty 2

## 2015-09-20 MED ORDER — GABAPENTIN 300 MG PO CAPS
300.0000 mg | ORAL_CAPSULE | Freq: Four times a day (QID) | ORAL | Status: DC
  Administered 2015-09-20 – 2015-09-21 (×4): 300 mg via ORAL
  Filled 2015-09-20 (×4): qty 1

## 2015-09-20 MED ORDER — HEPARIN (PORCINE) IN NACL 2-0.9 UNIT/ML-% IJ SOLN
INTRAMUSCULAR | Status: AC
  Filled 2015-09-20: qty 500

## 2015-09-20 MED ORDER — SODIUM CHLORIDE 0.9 % IJ SOLN
3.0000 mL | Freq: Two times a day (BID) | INTRAMUSCULAR | Status: DC
  Administered 2015-09-20: 3 mL via INTRAVENOUS

## 2015-09-20 MED ORDER — SODIUM CHLORIDE 0.9 % IV SOLN
INTRAVENOUS | Status: AC
  Administered 2015-09-20: 18:00:00 via INTRAVENOUS

## 2015-09-20 MED ORDER — MORPHINE SULFATE (PF) 2 MG/ML IV SOLN: 2.0000 mg | INTRAVENOUS | Status: DC | PRN

## 2015-09-20 MED ORDER — HEPARIN (PORCINE) IN NACL 2-0.9 UNIT/ML-% IJ SOLN
INTRAMUSCULAR | Status: DC | PRN
  Administered 2015-09-20: 18:00:00 via INTRA_ARTERIAL

## 2015-09-20 MED ORDER — PANTOPRAZOLE SODIUM 40 MG PO TBEC
40.0000 mg | DELAYED_RELEASE_TABLET | Freq: Every day | ORAL | Status: DC
  Administered 2015-09-21: 40 mg via ORAL
  Filled 2015-09-20: qty 1

## 2015-09-20 MED ORDER — ONDANSETRON HCL 4 MG/2ML IJ SOLN: 4.0000 mg | Freq: Four times a day (QID) | INTRAMUSCULAR | Status: DC | PRN

## 2015-09-20 MED ORDER — ACETAMINOPHEN 325 MG PO TABS: 650.0000 mg | ORAL_TABLET | ORAL | Status: DC | PRN

## 2015-09-20 MED ORDER — VERAPAMIL HCL 2.5 MG/ML IV SOLN
INTRAVENOUS | Status: AC
  Filled 2015-09-20: qty 2

## 2015-09-20 MED ORDER — FENTANYL CITRATE (PF) 100 MCG/2ML IJ SOLN
INTRAMUSCULAR | Status: DC | PRN
  Administered 2015-09-20: 50 ug via INTRAVENOUS

## 2015-09-20 MED ORDER — ACETAMINOPHEN 650 MG RE SUPP: 650.0000 mg | Freq: Four times a day (QID) | RECTAL | Status: DC | PRN

## 2015-09-20 MED ORDER — INSULIN ASPART 100 UNIT/ML ~~LOC~~ SOLN
0.0000 [IU] | Freq: Three times a day (TID) | SUBCUTANEOUS | Status: DC
  Administered 2015-09-21: 3 [IU] via SUBCUTANEOUS
  Administered 2015-09-21: 07:00:00 1 [IU] via SUBCUTANEOUS

## 2015-09-20 MED ORDER — HEPARIN BOLUS VIA INFUSION
4000.0000 [IU] | Freq: Once | INTRAVENOUS | Status: AC
  Administered 2015-09-20: 4000 [IU] via INTRAVENOUS
  Filled 2015-09-20: qty 4000

## 2015-09-20 MED ORDER — HEPARIN SODIUM (PORCINE) 1000 UNIT/ML IJ SOLN
INTRAMUSCULAR | Status: DC | PRN
  Administered 2015-09-20: 5000 [IU] via INTRAVENOUS

## 2015-09-20 MED ORDER — HEPARIN SODIUM (PORCINE) 1000 UNIT/ML IJ SOLN
INTRAMUSCULAR | Status: AC
  Filled 2015-09-20: qty 1

## 2015-09-20 MED ORDER — LIDOCAINE HCL (PF) 1 % IJ SOLN
INTRAMUSCULAR | Status: AC
  Filled 2015-09-20: qty 30

## 2015-09-20 MED ORDER — SODIUM CHLORIDE 0.9 % IJ SOLN: 3.0000 mL | Freq: Two times a day (BID) | INTRAMUSCULAR | Status: DC

## 2015-09-20 MED ORDER — DILTIAZEM HCL 30 MG PO TABS
30.0000 mg | ORAL_TABLET | Freq: Two times a day (BID) | ORAL | Status: DC
  Administered 2015-09-20 – 2015-09-21 (×2): 30 mg via ORAL
  Filled 2015-09-20 (×3): qty 1

## 2015-09-20 MED ORDER — ASPIRIN 81 MG PO CHEW
243.0000 mg | CHEWABLE_TABLET | Freq: Once | ORAL | Status: AC
  Administered 2015-09-20: 243 mg via ORAL
  Filled 2015-09-20: qty 3

## 2015-09-20 MED ORDER — MIDAZOLAM HCL 2 MG/2ML IJ SOLN
INTRAMUSCULAR | Status: DC | PRN
  Administered 2015-09-20: 2 mg via INTRAVENOUS

## 2015-09-20 MED ORDER — ONDANSETRON HCL 4 MG/2ML IJ SOLN
4.0000 mg | Freq: Once | INTRAMUSCULAR | Status: DC
  Filled 2015-09-20: qty 2

## 2015-09-20 MED ORDER — ENOXAPARIN SODIUM 60 MG/0.6ML ~~LOC~~ SOLN
50.0000 mg | SUBCUTANEOUS | Status: DC
  Administered 2015-09-20: 50 mg via SUBCUTANEOUS
  Filled 2015-09-20: qty 0.6

## 2015-09-20 MED ORDER — NITROGLYCERIN IN D5W 200-5 MCG/ML-% IV SOLN
3.0000 ug/min | INTRAVENOUS | Status: DC
  Administered 2015-09-20: 3 ug/min via INTRAVENOUS

## 2015-09-20 MED ORDER — MIDAZOLAM HCL 2 MG/2ML IJ SOLN
INTRAMUSCULAR | Status: AC
  Filled 2015-09-20: qty 2

## 2015-09-20 MED ORDER — LIVING WELL WITH DIABETES BOOK
Freq: Once | Status: AC
  Administered 2015-09-20: 21:00:00
  Filled 2015-09-20: qty 1

## 2015-09-20 MED ORDER — LIDOCAINE HCL (PF) 1 % IJ SOLN
INTRAMUSCULAR | Status: DC | PRN
  Administered 2015-09-20: 18:00:00

## 2015-09-20 MED ORDER — ATORVASTATIN CALCIUM 40 MG PO TABS
40.0000 mg | ORAL_TABLET | Freq: Every day | ORAL | Status: DC
  Administered 2015-09-21: 40 mg via ORAL
  Filled 2015-09-20: qty 1

## 2015-09-20 MED ORDER — ACETAMINOPHEN 325 MG PO TABS: 650.0000 mg | ORAL_TABLET | Freq: Four times a day (QID) | ORAL | Status: DC | PRN

## 2015-09-20 MED ORDER — SODIUM CHLORIDE 0.9 % IV SOLN
INTRAVENOUS | Status: DC
  Administered 2015-09-20: 14:00:00 via INTRAVENOUS

## 2015-09-20 MED ORDER — FENTANYL CITRATE (PF) 100 MCG/2ML IJ SOLN
INTRAMUSCULAR | Status: AC
  Filled 2015-09-20: qty 2

## 2015-09-20 MED ORDER — MORPHINE SULFATE (PF) 2 MG/ML IV SOLN
2.0000 mg | INTRAVENOUS | Status: DC | PRN
  Administered 2015-09-20: 2 mg via INTRAVENOUS
  Filled 2015-09-20: qty 1

## 2015-09-20 MED ORDER — ASPIRIN EC 325 MG PO TBEC
325.0000 mg | DELAYED_RELEASE_TABLET | Freq: Every day | ORAL | Status: DC
  Administered 2015-09-21: 325 mg via ORAL
  Filled 2015-09-20: qty 1

## 2015-09-20 MED ORDER — NITROGLYCERIN 0.4 MG SL SUBL
0.4000 mg | SUBLINGUAL_TABLET | SUBLINGUAL | Status: DC | PRN
  Administered 2015-09-20: 0.4 mg via SUBLINGUAL
  Filled 2015-09-20: qty 1

## 2015-09-20 MED ORDER — HEPARIN (PORCINE) IN NACL 100-0.45 UNIT/ML-% IJ SOLN
1600.0000 [IU]/h | INTRAMUSCULAR | Status: DC
Start: 2015-09-20 — End: 2015-09-20
  Administered 2015-09-20: 1600 [IU]/h via INTRAVENOUS
  Filled 2015-09-20: qty 250

## 2015-09-20 MED ORDER — SODIUM CHLORIDE 0.9 % IJ SOLN: 3.0000 mL | INTRAMUSCULAR | Status: DC | PRN

## 2015-09-20 MED ORDER — CEFUROXIME AXETIL 500 MG PO TABS
500.0000 mg | ORAL_TABLET | Freq: Two times a day (BID) | ORAL | Status: DC
  Administered 2015-09-20 – 2015-09-21 (×2): 500 mg via ORAL
  Filled 2015-09-20 (×3): qty 1

## 2015-09-20 MED ORDER — ONDANSETRON HCL 4 MG/2ML IJ SOLN
4.0000 mg | Freq: Once | INTRAMUSCULAR | Status: AC
  Administered 2015-09-20: 4 mg via INTRAVENOUS

## 2015-09-20 MED ORDER — HYDROCODONE-ACETAMINOPHEN 7.5-325 MG PO TABS
1.0000 | ORAL_TABLET | ORAL | Status: DC | PRN
  Administered 2015-09-20 (×2): 1 via ORAL
  Filled 2015-09-20 (×2): qty 1

## 2015-09-20 MED ORDER — BUPROPION HCL ER (SR) 150 MG PO TB12
150.0000 mg | ORAL_TABLET | Freq: Two times a day (BID) | ORAL | Status: DC
  Administered 2015-09-20 – 2015-09-21 (×2): 150 mg via ORAL
  Filled 2015-09-20 (×5): qty 1

## 2015-09-20 MED ORDER — SODIUM CHLORIDE 0.9 % IV SOLN: 250.0000 mL | INTRAVENOUS | Status: DC | PRN

## 2015-09-20 SURGICAL SUPPLY — 11 items
CATH INFINITI 5 FR JL3.5 (CATHETERS) ×3 IMPLANT
CATH INFINITI 5FR ANG PIGTAIL (CATHETERS) ×3 IMPLANT
CATH INFINITI JR4 5F (CATHETERS) ×5 IMPLANT
DEVICE RAD COMP TR BAND LRG (VASCULAR PRODUCTS) ×3 IMPLANT
GLIDESHEATH SLEND SS 6F .021 (SHEATH) ×3 IMPLANT
KIT HEART LEFT (KITS) ×3 IMPLANT
PACK CARDIAC CATHETERIZATION (CUSTOM PROCEDURE TRAY) ×3 IMPLANT
SYR MEDRAD MARK V 150ML (SYRINGE) ×3 IMPLANT
TRANSDUCER W/STOPCOCK (MISCELLANEOUS) ×3 IMPLANT
TUBING CIL FLEX 10 FLL-RA (TUBING) ×3 IMPLANT
WIRE SAFE-T 1.5MM-J .035X260CM (WIRE) ×3 IMPLANT

## 2015-09-20 NOTE — Progress Notes (Signed)
TR BAND REMOVAL  LOCATION:    right radial  DEFLATED PER PROTOCOL:    Yes.    TIME BAND OFF / DRESSING APPLIED:    21:15   SITE UPON ARRIVAL:    Level 0  SITE AFTER BAND REMOVAL:    Level 0  CIRCULATION SENSATION AND MOVEMENT:    Within Normal Limits   Yes.    COMMENTS:

## 2015-09-20 NOTE — Consult Note (Signed)
Reason for Consult:   Chest pain  Requesting Physician: PTH  Primary Cardiologist Dr. 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 He tolerated the procedure without complications. His LOV was July 2016 and he was doing well. He is admitted to [facility] now with complaints of 7-10 days of intermittent chest "pressure". It does not seem to be exertional. No associated arm or jaw pain. He has been nauseated, not clear if this is directly related to his chest pain. In the ED he received NTG x 1 with partial relief. The pt tells me this is the same pain he had with his PCI. EKG is without acute changes, Troponin negative x 1. He continues to c/o "5/10" chest pressure.   PMHx:  Past Medical History  Diagnosis Date  . Essential hypertension, benign   . Mixed hyperlipidemia 2006  . 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 Arthritis   . Fatty liver disease, nonalcoholic   . Coronary atherosclerosis of native coronary artery     a. 12/09/2013: s/p PCI with 3.0 x 28 Promus DES to mLAD  . Osgood-Schlatter's disease of right knee   . Type 2 diabetes mellitus ([place])   . Obesity     Past Surgical History  Procedure Laterality Date  . Liver biopsy    . Knee arthroplasty Right 1999  . Colonoscopy  06/19/2012    Procedure: COLONOSCOPY;  Surgeon: [name], MD;  Location: [facility];  Service: Endoscopy;  Laterality: N/A;  930  . Cardiac catheterization  2011  . Cholecystectomy  2003  . Tonsillectomy  1970's  . Left heart catheterization with coronary angiogram N/A 12/09/2013    Procedure: LEFT HEART CATHETERIZATION WITH CORONARY ANGIOGRAM;  Surgeon: [name], MD;  Location: [facility] CATH LAB;  Service: Cardiovascular;  Laterality: N/A;    SOCHx:  reports that he quit smoking about 32 years ago. His smoking use included Cigarettes. He started smoking about 42 years ago. He has a  5 pack-year smoking history. He quit smokeless tobacco use about 32 years ago. His smokeless tobacco use included Chew. He reports that he does not drink alcohol or use illicit drugs.  FAMHx: Family History  Problem Relation Age of Onset  . Cancer Maternal Aunt   . CVA Maternal Grandmother   . CVA Maternal Grandfather     ALLERGIES: Allergies  Allergen Reactions  . Metoprolol Rash    ROS: Review of Systems: General: negative for chills, fever, night sweats or weight changes.  Cardiovascular: negative for dyspnea on exertion, edema, orthopnea, palpitations, paroxysmal nocturnal dyspnea or shortness of breath HEENT: negative for any visual disturbances, blindness, glaucoma Dermatological: negative for rash Respiratory: negative for cough, hemoptysis, or wheezing Urologic: negative for hematuria or dysuria Abdominal: negative for nausea, vomiting, diarrhea, bright red blood per rectum, melena, or hematemesis Neurologic: negative for visual changes, syncope, or dizziness Musculoskeletal: negative for back pain, joint pain, or swelling Psych: cooperative and appropriate All other systems reviewed and are otherwise negative except as noted above.   HOME MEDICATIONS: Prior to Admission medications   Medication Sig Start Date End Date Taking? Authorizing Provider  acetaminophen (TYLENOL) 500 MG tablet Take 1,000 mg by mouth daily as needed for headache. Pain.   Yes Historical Provider, MD  aspirin (ASPIR-LOW) 81 MG EC tablet Take 81 mg by mouth daily.    Yes  Historical Provider, MD  atorvastatin (LIPITOR) 40 MG tablet Take 1 tablet (40 mg total) by mouth daily. 08/03/15  Yes Herminio Commons, MD  buPROPion (WELLBUTRIN SR) 150 MG 12 hr tablet TAKE ONE TABLET BY MOUTH FOR 3 DAYS; THEN TAKE ONE TABLET TWICE A DAYTHEREAFTER 02/25/15  Yes Mikey Kirschner, MD  cefdinir (OMNICEF) 300 MG capsule Take 1 capsule (300 mg total) by mouth 2 (two) times daily. 09/15/15  Yes Mikey Kirschner, MD    diltiazem (CARDIZEM) 30 MG tablet Take 1 tablet (30 mg total) by mouth 2 (two) times daily. 01/24/15  Yes Mikey Kirschner, MD  enalapril (VASOTEC) 10 MG tablet TAKE ONE (1) TABLET AT BEDTIME 01/24/15  Yes Mikey Kirschner, MD  gabapentin (NEURONTIN) 300 MG capsule Take 1 capsule (300 mg total) by mouth 4 (four) times daily. 01/24/15  Yes Mikey Kirschner, MD  glipiZIDE (GLUCOTROL) 5 MG tablet TAKE ONE TABLET BY MOUTH TWICE A DAY BEFORE A MEAL 08/24/15  Yes Mikey Kirschner, MD  HYDROcodone-acetaminophen (NORCO) 7.5-325 MG per tablet Take 1 tablet by mouth every 4 (four) hours as needed for moderate pain. 04/20/15  Yes Mikey Kirschner, MD  INVOKANA 300 MG TABS tablet TAKE ONE (1) TABLET EACH DAY BEFORE BREAKFAST 09/09/15  Yes Mikey Kirschner, MD  meclizine (ANTIVERT) 25 MG tablet Take 1 tablet (25 mg total) by mouth 3 (three) times daily as needed for dizziness or nausea. 10/06/14 10/06/15 Yes Mikey Kirschner, MD  nitroGLYCERIN (NITROSTAT) 0.4 MG SL tablet Place 1 tablet (0.4 mg total) under the tongue every 5 (five) minutes as needed for chest pain. 12/11/13  Yes [name], MD  pantoprazole (PROTONIX) 40 MG tablet TAKE ONE (1) TABLET EACH DAY 08/24/15  Yes Mikey Kirschner, MD  ranitidine (ZANTAC) 150 MG tablet Take 150 mg by mouth daily as needed for heartburn.   Yes Historical Provider, MD  temazepam (RESTORIL) 30 MG capsule Take 1 capsule (30 mg total) by mouth at bedtime as needed for sleep. 02/15/15  Yes Mikey Kirschner, MD    HOSPITAL MEDICATIONS: I have reviewed the patient's current medications.  VITALS: Blood pressure 115/78, pulse 83, temperature 98.6 F (37 C), temperature source Oral, resp. rate 20, height 6' 2"  (1.88 m), weight 231 lb 11.2 oz (105.098 kg), SpO2 94 %.  PHYSICAL EXAM: General appearance: alert, cooperative and no distress Neck: no carotid bruit and no JVD Lungs: clear to auscultation bilaterally Heart: regular rate and rhythm Abdomen: soft, non-tender; bowel  sounds normal; no masses,  no organomegaly Extremities: extremities normal, atraumatic, no cyanosis or edema Pulses: 2+ and symmetric Skin: Skin color, texture, turgor normal. No rashes or lesions Neurologic: Grossly normal  LABS: Results for orders placed or performed during the hospital encounter of 09/20/15 (from the past 24 hour(s))  Basic metabolic panel     Status: Abnormal   Collection Time: 09/20/15  9:12 AM  Result Value Ref Range   Sodium 138 135 - 145 mmol/L   Potassium 4.1 3.5 - 5.1 mmol/L   Chloride 103 101 - 111 mmol/L   CO2 23 22 - 32 mmol/L   Glucose, Bld 255 (H) 65 - 99 mg/dL   BUN 19 6 - 20 mg/dL   Creatinine, Ser 0.72 0.61 - 1.24 mg/dL   Calcium 9.7 8.9 - 10.3 mg/dL   GFR calc non Af [name] >60 >60 mL/min   GFR calc Af [name] >60 >60 mL/min   Anion gap 12 5 -  15  CBC     Status: Abnormal   Collection Time: 09/20/15  9:12 AM  Result Value Ref Range   WBC 6.6 4.0 - 10.5 K/uL   RBC 5.31 4.22 - 5.81 MIL/uL   Hemoglobin 16.3 13.0 - 17.0 g/dL   HCT 45.6 39.0 - 52.0 %   MCV 85.9 78.0 - 100.0 fL   MCH 30.7 26.0 - 34.0 pg   MCHC 35.7 30.0 - 36.0 g/dL   RDW 13.1 11.5 - 15.5 %   Platelets 145 (L) 150 - 400 K/uL  Troponin I     Status: None   Collection Time: 09/20/15  9:12 AM  Result Value Ref Range   Troponin I <0.03 <0.031 ng/mL  Troponin I (q 6hr x 3)     Status: None   Collection Time: 09/20/15  1:37 PM  Result Value Ref Range   Troponin I <0.03 <0.031 ng/mL    EKG: NSR  IMAGING: Dg Chest Portable 1 View  09/20/2015  CLINICAL DATA:  Chest pain.  Shortness of breath and cough . EXAM: PORTABLE CHEST 1 VIEW COMPARISON:  11/25/2009 FINDINGS: Cardiomegaly with normal pulmonary vascularity. No focal infiltrate. No pleural effusion or pneumothorax. No acute bony abnormality. IMPRESSION: 1. Cardiomegaly.  No pulmonary venous congestion . 2. No acute pulmonary disease. Electronically Signed   By: Marcello Moores  Register   On: 09/20/2015 09:26    IMPRESSION: Principal  Problem:   Accelerating angina ([place]) Active Problems:   Hyperlipidemia LDL goal <70   Essential hypertension, benign   CAD S/P LAD DES March 2015   Type 2 diabetes mellitus [facility])   RECOMMENDATION: Will review with Dr Matilde Sprang, [place] beeper 09/20/2015, 2:43 PM    Attending note:  Patient seen and examined. Reviewed records and discussed the case with [name]. Mr. Klippel is a patient of Dr. Bronson Ing last seen in July 2016. 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 Cardiac history includes DES to the mid LAD in March 2015 in the setting of accelerating angina symptoms. He is admitted to the hospital complaining of worsening intermittent chest tightness over the last 10 days, also associated with dyspnea on exertion. He did not mention his symptoms to anyone until just recently. He reports compliance with his medications, and otherwise no major health changes.  On examination he reports worsening, moderate chest tightness. Recent blood pressure 115/78, heart rate in the 80s and sinus rhythm. Lungs are clear without labored breathing, cardiac exam reveals RRR without gallop or rub. Lab work shows normal troponin I levels 2 so far, ECG shows sinus rhythm with probable left atrial enlargement and no acute ST segment changes. Chest x-ray report card and neck that he was otherwise no acute process.  Situation discussed with the patient and family members present. He tells me that these symptoms are very much like what he experienced back in March 2015 prior to undergoing DES to the mid LAD. Although ECG and initial cardiac markers are reassuring, he reports moderate chest tightness at this time concerning for unstable angina, and our plan will be to initiate heparin as well as intravenous nitroglycerin, arranging transfer to [facility] for a diagnostic cardiac catheterization to clearly evaluate his coronary anatomy and assess for revascularization  options. He is in agreement to proceed.  Satira Sark, M.D., F.A.C.C.

## 2015-09-20 NOTE — ED Notes (Signed)
Patient states he would like something for his headache. RN made aware.

## 2015-09-20 NOTE — Telephone Encounter (Signed)
I agree, patient with known history of coronary artery disease. If he is having chest pain it should be taken seriously and he should be evaluted in the ER   Zandra Abts MD

## 2015-09-20 NOTE — Progress Notes (Signed)
ANTICOAGULATION CONSULT NOTE - Initial Consult  Pharmacy Consult for heparin Indication: chest pain/ACS  Allergies  Allergen Reactions  . Metoprolol Rash    Patient Measurements: Height: 6' 2"  (188 cm) Weight: 231 lb 11.2 oz (105.098 kg) IBW/kg (Calculated) : 82.2 Heparin Dosing Weight: 103 kg  Vital Signs: Temp: 98.6 F (37 C) (01/03 1342) Temp Source: Oral (01/03 1342) BP: 115/78 mmHg (01/03 1342) Pulse Rate: 83 (01/03 1342)  Labs:  Recent Labs  09/20/15 0912 09/20/15 1337  HGB 16.3  --   HCT 45.6  --   PLT 145*  --   CREATININE 0.72  --   TROPONINI <0.03 <0.03    Estimated Creatinine Clearance: 138.1 mL/min (by C-G formula based on Cr of 0.72).   Medical History: Past Medical History  Diagnosis Date  . Essential hypertension, benign   . Mixed hyperlipidemia 2006  . 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 Arthritis   . Fatty liver disease, nonalcoholic   . Coronary atherosclerosis of native coronary artery     a. 12/09/2013: s/p PCI with 3.0 x 28 Promus DES to mLAD  . Osgood-Schlatter's disease of right knee   . Type 2 diabetes mellitus ([place])   . Obesity     Medications:  Prescriptions prior to admission  Medication Sig Dispense Refill Last Dose  . acetaminophen (TYLENOL) 500 MG tablet Take 1,000 mg by mouth daily as needed for headache. Pain.   Past Week at Unknown time  . aspirin (ASPIR-LOW) 81 MG EC tablet Take 81 mg by mouth daily.    09/20/2015 at Unknown time  . atorvastatin (LIPITOR) 40 MG tablet Take 1 tablet (40 mg total) by mouth daily. 30 tablet 6 09/19/2015 at Unknown time  . buPROPion (WELLBUTRIN SR) 150 MG 12 hr tablet TAKE ONE TABLET BY MOUTH FOR 3 DAYS; THEN TAKE ONE TABLET TWICE A DAYTHEREAFTER 60 tablet 5 09/20/2015 at Unknown time  . cefdinir (OMNICEF) 300 MG capsule Take 1 capsule (300 mg total) by mouth 2 (two) times daily. 20 capsule 0 09/20/2015 at Unknown time  . diltiazem (CARDIZEM) 30 MG tablet Take 1 tablet (30 mg total)  by mouth 2 (two) times daily. 60 tablet 5 09/20/2015 at Unknown time  . enalapril (VASOTEC) 10 MG tablet TAKE ONE (1) TABLET AT BEDTIME 30 tablet 5 09/19/2015 at Unknown time  . gabapentin (NEURONTIN) 300 MG capsule Take 1 capsule (300 mg total) by mouth 4 (four) times daily. 120 capsule 5 09/20/2015 at Unknown time  . glipiZIDE (GLUCOTROL) 5 MG tablet TAKE ONE TABLET BY MOUTH TWICE A DAY BEFORE A MEAL 60 tablet 5 09/20/2015 at Unknown time  . HYDROcodone-acetaminophen (NORCO) 7.5-325 MG per tablet Take 1 tablet by mouth every 4 (four) hours as needed for moderate pain. 30 tablet 0 Past Week at Unknown time  . INVOKANA 300 MG TABS tablet TAKE ONE (1) TABLET EACH DAY BEFORE BREAKFAST 30 tablet 0 09/20/2015 at Unknown time  . meclizine (ANTIVERT) 25 MG tablet Take 1 tablet (25 mg total) by mouth 3 (three) times daily as needed for dizziness or nausea. 30 tablet 1 Past Month at Unknown time  . nitroGLYCERIN (NITROSTAT) 0.4 MG SL tablet Place 1 tablet (0.4 mg total) under the tongue every 5 (five) minutes as needed for chest pain. 25 tablet 3 unknown  . pantoprazole (PROTONIX) 40 MG tablet TAKE ONE (1) TABLET EACH DAY 30 tablet 5 09/20/2015 at Unknown time  . ranitidine (ZANTAC) 150 MG tablet Take 150 mg  by mouth daily as needed for heartburn.   Past Week at Unknown time  . temazepam (RESTORIL) 30 MG capsule Take 1 capsule (30 mg total) by mouth at bedtime as needed for sleep. 30 capsule 0 09/19/2015 at Unknown time    Assessment: 55 yo man to start heparin for CP.  He received 50 mg of lovenox sq earlier today at 14 :25. His PTLC is low, Hg in normal range Goal of Therapy:  Heparin level 0.3-0.7 units/ml Monitor platelets by anticoagulation protocol: Yes   Plan:  Heparin bolus 4000 units and drip at 1600 units/hr Check heparin level ~6-8 hours after start of drip Daily HL and CBC while on heparin Monitor for bleeding complications  Thanks for allowing pharmacy to be a part of this patient's care.  Excell Seltzer, PharmD Clinical Pharmacist 09/20/2015,3:04 PM

## 2015-09-20 NOTE — Telephone Encounter (Signed)
Pt and wife is at [facility] ED now.

## 2015-09-20 NOTE — Progress Notes (Signed)
CBG 107 

## 2015-09-20 NOTE — H&P (Signed)
[facility] History and Physical  Jared Griffin:[id number] DOB: Mar 21, 1961 DOA: 09/20/2015  Referring physician:  PCP: Mickie Hillier, MD  Specialists:   Chief Complaint: chest pains   HPI: Jared Griffin is a 55 y.o. male with PMH of HTN, HPL, DM, CAD (PTCA stent-2015, off plavix since 11/2014) presented with continuous mild, substernal chest pains for 10 days, associated with mild DOE, but no Exertional chest pains. He denies acute SOB, no diaphoresis, no palpitations, no nausea, vomiting, no abdominal pains, no cough, no fever. He recently had mild URI.   Review of Systems: The patient denies anorexia, fever, weight loss,, vision loss, decreased hearing, hoarseness, chest pain, syncope, dyspnea on exertion, peripheral edema, balance deficits, hemoptysis, abdominal pain, melena, hematochezia, severe indigestion/heartburn, hematuria, incontinence, genital sores, muscle weakness, suspicious skin lesions, transient blindness, difficulty walking, depression, unusual weight change, abnormal bleeding, enlarged lymph nodes, angioedema, and breast masses.    Past Medical History  Diagnosis Date  . Essential hypertension, benign   . Mixed hyperlipidemia 2006  . 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 Arthritis   . Fatty liver disease, nonalcoholic   . Coronary atherosclerosis of native coronary artery     a. 12/09/2013: s/p PCI with 3.0 x 28 Promus DES to mLAD  . Osgood-Schlatter's disease of right knee   . Type 2 diabetes mellitus ([place])   . Obesity    Past Surgical History  Procedure Laterality Date  . Liver biopsy    . Knee arthroplasty Right 1999    "bone growing on top of kneecap" (12/09/2013)  . Colonoscopy  06/19/2012    Procedure: COLONOSCOPY;  Surgeon: [name], MD;  [facility];  Service: Endoscopy;  Laterality: N/A;  930  . Cardiac catheterization  2011  . Coronary angioplasty with stent placement  12/09/2013    "1"  . Cholecystectomy   2003  . Tonsillectomy  1970's  . Left heart catheterization with coronary angiogram N/A 12/09/2013    Procedure: LEFT HEART CATHETERIZATION WITH CORONARY ANGIOGRAM;  Surgeon: [name], MD;  Location: [facility] CATH LAB;  Service: Cardiovascular;  Laterality: N/A;   Social History:  reports that he quit smoking about 32 years ago. His smoking use included Cigarettes. He started smoking about 42 years ago. He has a 5 pack-year smoking history. He quit smokeless tobacco use about 32 years ago. His smokeless tobacco use included Chew. He reports that he does not drink alcohol or use illicit drugs. Hom;e  where does patient live--home, ALF, SNF? and with whom if at home? Yes;  Can patient participate in ADLs?  Allergies  Allergen Reactions  . Metoprolol Rash    Family History  Problem Relation Age of Onset  . Cancer Maternal Aunt   . CVA Maternal Grandmother   . CVA Maternal Grandfather     (be sure to complete)  Prior to Admission medications   Medication Sig Start Date End Date Taking? Authorizing Provider  acetaminophen (TYLENOL) 500 MG tablet Take 1,000 mg by mouth daily as needed for headache. Pain.   Yes Historical Provider, MD  aspirin (ASPIR-LOW) 81 MG EC tablet Take 81 mg by mouth daily.    Yes Historical Provider, MD  atorvastatin (LIPITOR) 40 MG tablet Take 1 tablet (40 mg total) by mouth daily. 08/03/15  Yes Herminio Commons, MD  buPROPion (WELLBUTRIN SR) 150 MG 12 hr tablet TAKE ONE TABLET BY MOUTH FOR 3 DAYS; THEN TAKE ONE TABLET TWICE A DAYTHEREAFTER 02/25/15  Yes Mikey Kirschner, MD  cefdinir (OMNICEF) 300 MG capsule Take 1 capsule (300 mg total) by mouth 2 (two) times daily. 09/15/15  Yes Mikey Kirschner, MD  diltiazem (CARDIZEM) 30 MG tablet Take 1 tablet (30 mg total) by mouth 2 (two) times daily. 01/24/15  Yes Mikey Kirschner, MD  enalapril (VASOTEC) 10 MG tablet TAKE ONE (1) TABLET AT BEDTIME 01/24/15  Yes Mikey Kirschner, MD  gabapentin (NEURONTIN) 300 MG capsule  Take 1 capsule (300 mg total) by mouth 4 (four) times daily. 01/24/15  Yes Mikey Kirschner, MD  glipiZIDE (GLUCOTROL) 5 MG tablet TAKE ONE TABLET BY MOUTH TWICE A DAY BEFORE A MEAL 08/24/15  Yes Mikey Kirschner, MD  HYDROcodone-acetaminophen (NORCO) 7.5-325 MG per tablet Take 1 tablet by mouth every 4 (four) hours as needed for moderate pain. 04/20/15  Yes Mikey Kirschner, MD  INVOKANA 300 MG TABS tablet TAKE ONE (1) TABLET EACH DAY BEFORE BREAKFAST 09/09/15  Yes Mikey Kirschner, MD  meclizine (ANTIVERT) 25 MG tablet Take 1 tablet (25 mg total) by mouth 3 (three) times daily as needed for dizziness or nausea. 10/06/14 10/06/15 Yes Mikey Kirschner, MD  nitroGLYCERIN (NITROSTAT) 0.4 MG SL tablet Place 1 tablet (0.4 mg total) under the tongue every 5 (five) minutes as needed for chest pain. 12/11/13  Yes [name], MD  pantoprazole (PROTONIX) 40 MG tablet TAKE ONE (1) TABLET EACH DAY 08/24/15  Yes Mikey Kirschner, MD  ranitidine (ZANTAC) 150 MG tablet Take 150 mg by mouth daily as needed for heartburn.   Yes Historical Provider, MD  temazepam (RESTORIL) 30 MG capsule Take 1 capsule (30 mg total) by mouth at bedtime as needed for sleep. 02/15/15  Yes Mikey Kirschner, MD   Physical Exam: Filed Vitals:   09/20/15 1030 09/20/15 1100  BP: 111/79 123/79  Pulse: 81 87  Temp:    Resp: 23 19     General:  Alert. No distress   Eyes: eom-i, perrla   ENT: no oral ulcers   Neck: supple, no JVD  Cardiovascular: s1,s2 rrr  Respiratory: CTA BL  Abdomen: soft, nt,nd   Skin: no rash   Musculoskeletal: no leg edem a  Psychiatric: no hallucinations   Neurologic: CN 2-12 intact. Motor 5/5 BL symmetric   Labs on Admission:  Basic Metabolic Panel:  Recent Labs Lab 09/20/15 0912  NA 138  K 4.1  CL 103  CO2 23  GLUCOSE 255*  BUN 19  CREATININE 0.72  CALCIUM 9.7   Liver Function Tests: No results for input(s): AST, ALT, ALKPHOS, BILITOT, PROT, ALBUMIN in the last 168 hours. No  results for input(s): LIPASE, AMYLASE in the last 168 hours. No results for input(s): AMMONIA in the last 168 hours. CBC:  Recent Labs Lab 09/20/15 0912  WBC 6.6  HGB 16.3  HCT 45.6  MCV 85.9  PLT 145*   Cardiac Enzymes:  Recent Labs Lab 09/20/15 0912  TROPONINI <0.03    BNP (last 3 results) No results for input(s): BNP in the last 8760 hours.  ProBNP (last 3 results) No results for input(s): PROBNP in the last 8760 hours.  CBG: No results for input(s): GLUCAP in the last 168 hours.  Radiological Exams on Admission: Dg Chest Portable 1 View  09/20/2015  CLINICAL DATA:  Chest pain.  Shortness of breath and cough . EXAM: PORTABLE CHEST 1 VIEW COMPARISON:  11/25/2009 FINDINGS: Cardiomegaly with normal pulmonary vascularity. No focal infiltrate. No pleural  effusion or pneumothorax. No acute bony abnormality. IMPRESSION: 1. Cardiomegaly.  No pulmonary venous congestion . 2. No acute pulmonary disease. Electronically Signed   By: Marcello Moores  Register   On: 09/20/2015 09:26    EKG: Independently reviewed.   Assessment/Plan Active Problems:   Chest pain   55 y.o. male with PMH of HTN, HPL, DM, CAD (PTCA stent-2015, off plavix since 11/2014) presented with continuous mild, substernal chest pains for 10 days   1. Chest pains. Unclear etiology. R/o ACS. Initial trop negative. ECG: no acute ST changes. Patient has h/o CAD (PTCA stent-2015, off plavix since 11/2014) -we will cont ASA, statins, NTG. Patient is not on BB. Obtain serial, trop, ECG. And echo. Consult cardiology eval   2. DM. Last ha1c-6.7. Cont ISS while inpatient 3. HTN. Stable. Hold enalapril inpatient if need for LHC 4. HPL. On statin  Cardiology;  if consultant consulted, please document name and whether formally or informally consulted  Code Status: full (must indicate code status--if unknown or must be presumed, indicate so) Family Communication:  D/w patient, his wife, family  (indicate person spoken with, if  applicable, with phone number if by telephone) Disposition Plan: home 24-48 hrs.  (indicate anticipated LOS)  Time spent: >35 minutes   Kinnie Feil [facility] Pager [phone]  If 7PM-7AM, please contact night-coverage [email or web address] Password [email or web address] 09/20/2015, 11:47 AM

## 2015-09-20 NOTE — ED Provider Notes (Signed)
CSN: [id number]     Arrival date & time 09/20/15  0847 History   First MD Initiated Contact with Patient 09/20/15 [phone]     Chief Complaint  Patient presents with  . Chest Pain    for last 10 days ago     (Consider location/radiation/quality/duration/timing/severity/associated sxs/prior Treatment) The history is provided by the patient and the spouse.   Jared Griffin is a 55 y.o. male with a history of CAD with stent placement March 2015 presenting with a 10 day history of midsternal chest pressure which has been fairly constant with intermittent episodes of sharp pain reminiscent of his prior symptoms before stent placement last year.  He also reports intermittent episodes of shortness of breath, not currently and which is nonexertional.  Wife endorses positive complaints of nausea although patient downplays this symptom.  He does endorse generalized weakness and decreased energy level.  He has had no cough or congestion, fevers or chills.  Denies peripheral edema.  He has taken 81 mg aspirin prior to arrival today.  He has found no alleviators for symptoms.  His wife called his cardiologist's office Dr. Jacinta Shoe and was advised to come here.    Past Medical History  Diagnosis Date  . Essential hypertension, benign   . Mixed hyperlipidemia 2006  . 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 Arthritis   . Fatty liver disease, nonalcoholic   . Coronary atherosclerosis of native coronary artery     a. 12/09/2013: s/p PCI with 3.0 x 28 Promus DES to mLAD  . Osgood-Schlatter's disease of right knee   . Type 2 diabetes mellitus ([place])   . Obesity    Past Surgical History  Procedure Laterality Date  . Liver biopsy    . Knee arthroplasty Right 1999  . Colonoscopy  06/19/2012    Procedure: COLONOSCOPY;  Surgeon: [name], MD;  [facility];  Service: Endoscopy;  Laterality: N/A;  930  . Cardiac catheterization  2011  . Cholecystectomy  2003  . Tonsillectomy   1970's  . Left heart catheterization with coronary angiogram N/A 12/09/2013    Procedure: LEFT HEART CATHETERIZATION WITH CORONARY ANGIOGRAM;  Surgeon: [name], MD;  Location: [facility] CATH LAB;  Service: Cardiovascular;  Laterality: N/A;   Family History  Problem Relation Age of Onset  . Cancer Maternal Aunt   . CVA Maternal Grandmother   . CVA Maternal Grandfather    Social History  Substance Use Topics  . Smoking status: Former Smoker -- 0.50 packs/day for 10 years    Types: Cigarettes    Start date: 02/02/1973    Quit date: 09/17/1983  . Smokeless tobacco: Former Systems developer    Types: [place] date: 09/17/1983  . Alcohol Use: No    Review of Systems  Constitutional: Negative for fever and chills.  HENT: Negative for congestion and sore throat.   Eyes: Negative.   Respiratory: Positive for chest tightness and shortness of breath. Negative for cough and wheezing.   Cardiovascular: Positive for chest pain.  Gastrointestinal: Negative for nausea and abdominal pain.  Genitourinary: Negative.   Musculoskeletal: Negative for joint swelling, arthralgias and neck pain.  Skin: Negative.  Negative for rash and wound.  Neurological: Negative for dizziness, weakness, light-headedness, numbness and headaches.  Psychiatric/Behavioral: Negative.       Allergies  Metoprolol  Home Medications   Prior to Admission medications   Medication Sig Start Date End Date Taking? Authorizing Provider  acetaminophen (TYLENOL) 500 MG tablet Take 1,000 mg by mouth daily as needed for headache. Pain.   Yes Historical Provider, MD  aspirin (ASPIR-LOW) 81 MG EC tablet Take 81 mg by mouth daily.    Yes Historical Provider, MD  atorvastatin (LIPITOR) 40 MG tablet Take 1 tablet (40 mg total) by mouth daily. 08/03/15  Yes Herminio Commons, MD  buPROPion (WELLBUTRIN SR) 150 MG 12 hr tablet TAKE ONE TABLET BY MOUTH FOR 3 DAYS; THEN TAKE ONE TABLET TWICE A DAYTHEREAFTER 02/25/15  Yes Mikey Kirschner,  MD  cefdinir (OMNICEF) 300 MG capsule Take 1 capsule (300 mg total) by mouth 2 (two) times daily. 09/15/15  Yes Mikey Kirschner, MD  diltiazem (CARDIZEM) 30 MG tablet Take 1 tablet (30 mg total) by mouth 2 (two) times daily. 01/24/15  Yes Mikey Kirschner, MD  enalapril (VASOTEC) 10 MG tablet TAKE ONE (1) TABLET AT BEDTIME 01/24/15  Yes Mikey Kirschner, MD  gabapentin (NEURONTIN) 300 MG capsule Take 1 capsule (300 mg total) by mouth 4 (four) times daily. 01/24/15  Yes Mikey Kirschner, MD  glipiZIDE (GLUCOTROL) 5 MG tablet TAKE ONE TABLET BY MOUTH TWICE A DAY BEFORE A MEAL 08/24/15  Yes Mikey Kirschner, MD  HYDROcodone-acetaminophen (NORCO) 7.5-325 MG per tablet Take 1 tablet by mouth every 4 (four) hours as needed for moderate pain. 04/20/15  Yes Mikey Kirschner, MD  INVOKANA 300 MG TABS tablet TAKE ONE (1) TABLET EACH DAY BEFORE BREAKFAST 09/09/15  Yes Mikey Kirschner, MD  meclizine (ANTIVERT) 25 MG tablet Take 1 tablet (25 mg total) by mouth 3 (three) times daily as needed for dizziness or nausea. 10/06/14 10/06/15 Yes Mikey Kirschner, MD  nitroGLYCERIN (NITROSTAT) 0.4 MG SL tablet Place 1 tablet (0.4 mg total) under the tongue every 5 (five) minutes as needed for chest pain. 12/11/13  Yes [name], MD  pantoprazole (PROTONIX) 40 MG tablet TAKE ONE (1) TABLET EACH DAY 08/24/15  Yes Mikey Kirschner, MD  ranitidine (ZANTAC) 150 MG tablet Take 150 mg by mouth daily as needed for heartburn.   Yes Historical Provider, MD  temazepam (RESTORIL) 30 MG capsule Take 1 capsule (30 mg total) by mouth at bedtime as needed for sleep. 02/15/15  Yes Mikey Kirschner, MD   BP 127/81 mmHg  Pulse 81  Temp(Src) 98.3 F (36.8 C) (Oral)  Resp 18  Ht 6' 2"  (1.88 m)  Wt 105.098 kg  BMI 29.74 kg/m2  SpO2 97% Physical Exam  Constitutional: He appears well-developed and well-nourished.  HENT:  Head: Normocephalic and atraumatic.  Eyes: Conjunctivae are normal.  Neck: Normal range of motion.  Cardiovascular:  Normal rate, regular rhythm, normal heart sounds and intact distal pulses.   Pulmonary/Chest: Effort normal and breath sounds normal. He has no wheezes.  Abdominal: Soft. Bowel sounds are normal. There is no tenderness.  Musculoskeletal: Normal range of motion. He exhibits no edema.  Neurological: He is alert.  Skin: Skin is warm and dry.  Psychiatric: He has a normal mood and affect.  Nursing note and vitals reviewed.   ED Course  Procedures (including critical care time) Labs Review Labs Reviewed  BASIC METABOLIC PANEL - Abnormal; Notable for the following:    Glucose, Bld 255 (*)    All other components within normal limits  CBC - Abnormal; Notable for the following:    Platelets 145 (*)    All other components within normal limits  GLUCOSE, CAPILLARY - Abnormal; Notable  for the following:    Glucose-Capillary 107 (*)    All other components within normal limits  TROPONIN I  TROPONIN I  TROPONIN I  TROPONIN I  HEPARIN LEVEL (UNFRACTIONATED)    Patient was given additional aspirin at time of arrival.  Nitroglycerin ordered.  Imaging Review Dg Chest Portable 1 View  09/20/2015  CLINICAL DATA:  Chest pain.  Shortness of breath and cough . EXAM: PORTABLE CHEST 1 VIEW COMPARISON:  11/25/2009 FINDINGS: Cardiomegaly with normal pulmonary vascularity. No focal infiltrate. No pleural effusion or pneumothorax. No acute bony abnormality. IMPRESSION: 1. Cardiomegaly.  No pulmonary venous congestion . 2. No acute pulmonary disease. Electronically Signed   By: Marcello Moores  Register   On: 09/20/2015 09:26   I have personally reviewed and evaluated these images and lab results as part of my medical decision-making.   EKG Interpretation   Date/Time:  Tuesday September 20 2015 08:54:37 EST Ventricular Rate:  91 PR Interval:  184 QRS Duration: 85 QT Interval:  353 QTC Calculation: 434 R Axis:   50 Text Interpretation:  Sinus rhythm Probable left atrial enlargement Low  voltage, precordial  leads No significant change since last tracing  Confirmed by LIU MD, DANA ([id number]) on 09/20/2015 9:56:56 AM      MDM   Final diagnoses:  Chest pain, unspecified chest pain type    Labs and imaging reviewed. Discussed with Dr. Oleta Mouse who also saw patient.  H/o constant chest pressure x 10 days, with sob, weakness, negative troponin and stable ekg.  Sx possibly suggesting unstable angina.  Pt given ntg SL x 1 with worsened pain.  Not repeated.  Discussed with Dr. Daleen Bo with [facility], agrees with admission, temp orders placed.     Evalee Jefferson, PA-C 09/20/15 [place], PA-C 09/20/15 1635  Forde Dandy, MD 09/20/15 [phone]

## 2015-09-20 NOTE — ED Provider Notes (Signed)
Medical screening examination/treatment/procedure(s) were conducted as a shared visit with non-physician practitioner(s) and myself.  I personally evaluated the patient during the encounter.   EKG Interpretation   Date/Time:  Tuesday September 20 2015 08:54:37 EST Ventricular Rate:  91 PR Interval:  184 QRS Duration: 85 QT Interval:  353 QTC Calculation: 434 R Axis:   50 Text Interpretation:  Sinus rhythm Probable left atrial enlargement Low  voltage, precordial leads No significant change since last tracing  Confirmed by Ronal Maybury MD, Oseph Imburgia [phone]) on 09/20/2015 9:56:33 AM      55 year old male with history of CAD with stent to LAD in March 2015, hypertension, hyperlipidemia and diabetes who presents with chest pressure. Pain has been fairly consistent over the course of the past 7-10 days, associated with an increased fatigue and shortness of breath with activity. Chest pain not particularly worse with activity, not associated with infectious symptoms or GI symptoms. Pain is similar to when he required PCI in the past. On arrival, he is hemodynamically stable, with pain partially improved with nitroglycerin. Taken a baby aspirin prior to arrival, and is given equivalent of a full dose of aspirin while here in the emergency department. Cardiopulmonary exam is overall unremarkable, and his initial troponin is negative. EKG is not acutely ischemic. CXR without acute cardiopulmonary processes.  Atypical in that pain has been ongoing for 7-10 days, but c/f his anginal equivalent and possible UA. Will admit to hospitalist service for cardiac rule out and potential cardiology consult/evaluation.   Forde Dandy, MD 09/20/15 [phone]

## 2015-09-20 NOTE — Telephone Encounter (Signed)
Pt wife says that pt having some discomfort in chest, some pain for the last week. Denies, SOB dizziness, wife says BP was WNL. Denies CP or discomfort this morning, pt went to work and wife is concerned. Told wife to have pt take nitroglycerin if pt having active CP and to report to ED with how long this has been going on. Will forward to DOD Dr Bronson Ing is out of office.

## 2015-09-20 NOTE — ED Notes (Addendum)
Having chest pain for last 10 days.  History of chest pain with stent place March 2015.  C/o weakness, SOB and burning.  Pt did take one 81 mg of ASA this am.

## 2015-09-20 NOTE — H&P (View-Only) (Signed)
Reason for Consult:   Chest pain  Requesting Physician: PTH  Primary Cardiologist Dr. 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 He tolerated the procedure without complications. His LOV was July 2016 and he was doing well. He is admitted to [facility] now with complaints of 7-10 days of intermittent chest "pressure". It does not seem to be exertional. No associated arm or jaw pain. He has been nauseated, not clear if this is directly related to his chest pain. In the ED he received NTG x 1 with partial relief. The pt tells me this is the same pain he had with his PCI. EKG is without acute changes, Troponin negative x 1. He continues to c/o "5/10" chest pressure.   PMHx:  Past Medical History  Diagnosis Date  . Essential hypertension, benign   . Mixed hyperlipidemia 2006  . 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 Arthritis   . Fatty liver disease, nonalcoholic   . Coronary atherosclerosis of native coronary artery     a. 12/09/2013: s/p PCI with 3.0 x 28 Promus DES to mLAD  . Osgood-Schlatter's disease of right knee   . Type 2 diabetes mellitus ([place])   . Obesity     Past Surgical History  Procedure Laterality Date  . Liver biopsy    . Knee arthroplasty Right 1999  . Colonoscopy  06/19/2012    Procedure: COLONOSCOPY;  Surgeon: [name], MD;  [facility];  Service: Endoscopy;  Laterality: N/A;  930  . Cardiac catheterization  2011  . Cholecystectomy  2003  . Tonsillectomy  1970's  . Left heart catheterization with coronary angiogram N/A 12/09/2013    Procedure: LEFT HEART CATHETERIZATION WITH CORONARY ANGIOGRAM;  Surgeon: [name], MD;  Location: [facility] CATH LAB;  Service: Cardiovascular;  Laterality: N/A;    SOCHx:  reports that he quit smoking about 32 years ago. His smoking use included Cigarettes. He started smoking about 42 years ago. He has a  5 pack-year smoking history. He quit smokeless tobacco use about 32 years ago. His smokeless tobacco use included Chew. He reports that he does not drink alcohol or use illicit drugs.  FAMHx: Family History  Problem Relation Age of Onset  . Cancer Maternal Aunt   . CVA Maternal Grandmother   . CVA Maternal Grandfather     ALLERGIES: Allergies  Allergen Reactions  . Metoprolol Rash    ROS: Review of Systems: General: negative for chills, fever, night sweats or weight changes.  Cardiovascular: negative for dyspnea on exertion, edema, orthopnea, palpitations, paroxysmal nocturnal dyspnea or shortness of breath HEENT: negative for any visual disturbances, blindness, glaucoma Dermatological: negative for rash Respiratory: negative for cough, hemoptysis, or wheezing Urologic: negative for hematuria or dysuria Abdominal: negative for nausea, vomiting, diarrhea, bright red blood per rectum, melena, or hematemesis Neurologic: negative for visual changes, syncope, or dizziness Musculoskeletal: negative for back pain, joint pain, or swelling Psych: cooperative and appropriate All other systems reviewed and are otherwise negative except as noted above.   HOME MEDICATIONS: Prior to Admission medications   Medication Sig Start Date End Date Taking? Authorizing Provider  acetaminophen (TYLENOL) 500 MG tablet Take 1,000 mg by mouth daily as needed for headache. Pain.   Yes Historical Provider, MD  aspirin (ASPIR-LOW) 81 MG EC tablet Take 81 mg by mouth daily.    Yes  Historical Provider, MD  atorvastatin (LIPITOR) 40 MG tablet Take 1 tablet (40 mg total) by mouth daily. 08/03/15  Yes Herminio Commons, MD  buPROPion (WELLBUTRIN SR) 150 MG 12 hr tablet TAKE ONE TABLET BY MOUTH FOR 3 DAYS; THEN TAKE ONE TABLET TWICE A DAYTHEREAFTER 02/25/15  Yes Mikey Kirschner, MD  cefdinir (OMNICEF) 300 MG capsule Take 1 capsule (300 mg total) by mouth 2 (two) times daily. 09/15/15  Yes Mikey Kirschner, MD    diltiazem (CARDIZEM) 30 MG tablet Take 1 tablet (30 mg total) by mouth 2 (two) times daily. 01/24/15  Yes Mikey Kirschner, MD  enalapril (VASOTEC) 10 MG tablet TAKE ONE (1) TABLET AT BEDTIME 01/24/15  Yes Mikey Kirschner, MD  gabapentin (NEURONTIN) 300 MG capsule Take 1 capsule (300 mg total) by mouth 4 (four) times daily. 01/24/15  Yes Mikey Kirschner, MD  glipiZIDE (GLUCOTROL) 5 MG tablet TAKE ONE TABLET BY MOUTH TWICE A DAY BEFORE A MEAL 08/24/15  Yes Mikey Kirschner, MD  HYDROcodone-acetaminophen (NORCO) 7.5-325 MG per tablet Take 1 tablet by mouth every 4 (four) hours as needed for moderate pain. 04/20/15  Yes Mikey Kirschner, MD  INVOKANA 300 MG TABS tablet TAKE ONE (1) TABLET EACH DAY BEFORE BREAKFAST 09/09/15  Yes Mikey Kirschner, MD  meclizine (ANTIVERT) 25 MG tablet Take 1 tablet (25 mg total) by mouth 3 (three) times daily as needed for dizziness or nausea. 10/06/14 10/06/15 Yes Mikey Kirschner, MD  nitroGLYCERIN (NITROSTAT) 0.4 MG SL tablet Place 1 tablet (0.4 mg total) under the tongue every 5 (five) minutes as needed for chest pain. 12/11/13  Yes [name], MD  pantoprazole (PROTONIX) 40 MG tablet TAKE ONE (1) TABLET EACH DAY 08/24/15  Yes Mikey Kirschner, MD  ranitidine (ZANTAC) 150 MG tablet Take 150 mg by mouth daily as needed for heartburn.   Yes Historical Provider, MD  temazepam (RESTORIL) 30 MG capsule Take 1 capsule (30 mg total) by mouth at bedtime as needed for sleep. 02/15/15  Yes Mikey Kirschner, MD    HOSPITAL MEDICATIONS: I have reviewed the patient's current medications.  VITALS: Blood pressure 115/78, pulse 83, temperature 98.6 F (37 C), temperature source Oral, resp. rate 20, height 6' 2"  (1.88 m), weight 231 lb 11.2 oz (105.098 kg), SpO2 94 %.  PHYSICAL EXAM: General appearance: alert, cooperative and no distress Neck: no carotid bruit and no JVD Lungs: clear to auscultation bilaterally Heart: regular rate and rhythm Abdomen: soft, non-tender; bowel  sounds normal; no masses,  no organomegaly Extremities: extremities normal, atraumatic, no cyanosis or edema Pulses: 2+ and symmetric Skin: Skin color, texture, turgor normal. No rashes or lesions Neurologic: Grossly normal  LABS: Results for orders placed or performed during the hospital encounter of 09/20/15 (from the past 24 hour(s))  Basic metabolic panel     Status: Abnormal   Collection Time: 09/20/15  9:12 AM  Result Value Ref Range   Sodium 138 135 - 145 mmol/L   Potassium 4.1 3.5 - 5.1 mmol/L   Chloride 103 101 - 111 mmol/L   CO2 23 22 - 32 mmol/L   Glucose, Bld 255 (H) 65 - 99 mg/dL   BUN 19 6 - 20 mg/dL   Creatinine, Ser 0.72 0.61 - 1.24 mg/dL   Calcium 9.7 8.9 - 10.3 mg/dL   GFR calc non Af [name] >60 >60 mL/min   GFR calc Af [name] >60 >60 mL/min   Anion gap 12 5 -  15  CBC     Status: Abnormal   Collection Time: 09/20/15  9:12 AM  Result Value Ref Range   WBC 6.6 4.0 - 10.5 K/uL   RBC 5.31 4.22 - 5.81 MIL/uL   Hemoglobin 16.3 13.0 - 17.0 g/dL   HCT 45.6 39.0 - 52.0 %   MCV 85.9 78.0 - 100.0 fL   MCH 30.7 26.0 - 34.0 pg   MCHC 35.7 30.0 - 36.0 g/dL   RDW 13.1 11.5 - 15.5 %   Platelets 145 (L) 150 - 400 K/uL  Troponin I     Status: None   Collection Time: 09/20/15  9:12 AM  Result Value Ref Range   Troponin I <0.03 <0.031 ng/mL  Troponin I (q 6hr x 3)     Status: None   Collection Time: 09/20/15  1:37 PM  Result Value Ref Range   Troponin I <0.03 <0.031 ng/mL    EKG: NSR  IMAGING: Dg Chest Portable 1 View  09/20/2015  CLINICAL DATA:  Chest pain.  Shortness of breath and cough . EXAM: PORTABLE CHEST 1 VIEW COMPARISON:  11/25/2009 FINDINGS: Cardiomegaly with normal pulmonary vascularity. No focal infiltrate. No pleural effusion or pneumothorax. No acute bony abnormality. IMPRESSION: 1. Cardiomegaly.  No pulmonary venous congestion . 2. No acute pulmonary disease. Electronically Signed   By: Marcello Moores  Register   On: 09/20/2015 09:26    IMPRESSION: Principal  Problem:   Accelerating angina ([place]) Active Problems:   Hyperlipidemia LDL goal <70   Essential hypertension, benign   CAD S/P LAD DES March 2015   Type 2 diabetes mellitus [facility])   RECOMMENDATION: Will review with Dr Matilde Sprang, [place] beeper 09/20/2015, 2:43 PM    Attending note:  Patient seen and examined. Reviewed records and discussed the case with Mr. Reino Bellis. Mr. Engelmann is a patient of Dr. Bronson Ing last seen in July 2016. 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 Cardiac history includes DES to the mid LAD in March 2015 in the setting of accelerating angina symptoms. He is admitted to the hospital complaining of worsening intermittent chest tightness over the last 10 days, also associated with dyspnea on exertion. He did not mention his symptoms to anyone until just recently. He reports compliance with his medications, and otherwise no major health changes.  On examination he reports worsening, moderate chest tightness. Recent blood pressure 115/78, heart rate in the 80s and sinus rhythm. Lungs are clear without labored breathing, cardiac exam reveals RRR without gallop or rub. Lab work shows normal troponin I levels 2 so far, ECG shows sinus rhythm with probable left atrial enlargement and no acute ST segment changes. Chest x-ray report card and neck that he was otherwise no acute process.  Situation discussed with the patient and family members present. He tells me that these symptoms are very much like what he experienced back in March 2015 prior to undergoing DES to the mid LAD. Although ECG and initial cardiac markers are reassuring, he reports moderate chest tightness at this time concerning for unstable angina, and our plan will be to initiate heparin as well as intravenous nitroglycerin, arranging transfer to [facility] for a diagnostic cardiac catheterization to clearly evaluate his coronary anatomy and assess for revascularization  options. He is in agreement to proceed.  Satira Sark, M.D., F.A.C.C.

## 2015-09-20 NOTE — Interval H&P Note (Signed)
History and Physical Interval Note:  09/20/2015 5:14 PM  Jared Griffin  has presented today for cardiac cath with the diagnosis of unstable angina  The various methods of treatment have been discussed with the patient and family. After consideration of risks, benefits and other options for treatment, the patient has consented to  Procedure(s): Left Heart Cath and Coronary Angiography (N/A) as a surgical intervention .  The patient's history has been reviewed, patient examined, no change in status, stable for surgery.  I have reviewed the patient's chart and labs.  Questions were answered to the patient's satisfaction.   Cath Lab Visit (complete for each Cath Lab visit)  Clinical Evaluation Leading to the Procedure:   ACS: No.  Non-ACS:    Anginal Classification: CCS III  Anti-ischemic medical therapy: Minimal Therapy (1 class of medications)  Non-Invasive Test Results: No non-invasive testing performed  Prior CABG: No previous CABG          Jared Griffin

## 2015-09-20 NOTE — Progress Notes (Signed)
Report called to Allie at [facility].  Answered all questions at this time.

## 2015-09-21 ENCOUNTER — Inpatient Hospital Stay (HOSPITAL_COMMUNITY): Payer: PRIVATE HEALTH INSURANCE

## 2015-09-21 ENCOUNTER — Encounter (HOSPITAL_COMMUNITY): Payer: Self-pay | Admitting: Cardiovascular Disease

## 2015-09-21 DIAGNOSIS — R0789 Other chest pain: Secondary | ICD-10-CM

## 2015-09-21 DIAGNOSIS — Z0289 Encounter for other administrative examinations: Secondary | ICD-10-CM

## 2015-09-21 DIAGNOSIS — R079 Chest pain, unspecified: Secondary | ICD-10-CM

## 2015-09-21 LAB — CBC
HCT: 45.8 % (ref 39.0–52.0)
Hemoglobin: 15.8 g/dL (ref 13.0–17.0)
MCH: 30 pg (ref 26.0–34.0)
MCHC: 34.5 g/dL (ref 30.0–36.0)
MCV: 87.1 fL (ref 78.0–100.0)
Platelets: 155 10*3/uL (ref 150–400)
RBC: 5.26 MIL/uL (ref 4.22–5.81)
RDW: 13.2 % (ref 11.5–15.5)
WBC: 8.1 10*3/uL (ref 4.0–10.5)

## 2015-09-21 LAB — BASIC METABOLIC PANEL
Anion gap: 9 (ref 5–15)
BUN: 14 mg/dL (ref 6–20)
CO2: 28 mmol/L (ref 22–32)
Calcium: 9.3 mg/dL (ref 8.9–10.3)
Chloride: 101 mmol/L (ref 101–111)
Creatinine, Ser: 0.95 mg/dL (ref 0.61–1.24)
GFR calc Af Amer: >60 mL/min (ref >60)
GFR calc non Af Amer: >60 mL/min (ref >60)
Glucose, Bld: 163 mg/dL — ABNORMAL HIGH (ref 65–99)
Potassium: 4.3 mmol/L (ref 3.5–5.1)
Sodium: 138 mmol/L (ref 135–145)

## 2015-09-21 LAB — GLUCOSE, CAPILLARY
Glucose-Capillary: 131 mg/dL — ABNORMAL HIGH (ref 65–99)
Glucose-Capillary: 353 mg/dL — ABNORMAL HIGH (ref 65–99)
Glucose-Capillary: 220 mg/dL — ABNORMAL HIGH (ref 65–99)

## 2015-09-21 NOTE — Discharge Summary (Signed)
Physician Discharge Summary  Patient ID: Jared Griffin MRN: [id number] DOB/AGE: June 04, 1961 55 y.o.   Primary Cardiologist: Dr. [name]  Admit date: 09/20/2015 Discharge date: 09/21/2015  Admission Diagnoses: Accelerating Angina  Discharge Diagnoses:  Principal Problem:   Accelerating angina [facility]) Active Problems:   Hyperlipidemia LDL goal <70   Essential hypertension, benign   CAD S/P LAD DES March 2015   Type 2 diabetes mellitus (HCC)   Chest pain   Pain in the chest   Discharged Condition: stable  Hospital Course: 54 y/o male with known CAD s/p DES to the mid LAD 12/09/13, admitted for recurrent CP similar to his previous angina. EKGs showed no acute changes. He was admitted for observation. He ruled out for NSTEMI with negative enzymes. However, given the nature of his symptoms, he underwent a LHC on 09/19/14 which showed patent stent in mid LAD with no restenosis, mild to moderate mid LAD stenosis beyond the stent but this is a non flow limiting lesion. Normal Circ and RCA. A 2D echo was also obtained. The echo was read by Dr. Mare Ferrari. Per verbal report, EF was normal at 55-60% (offical written report pending). It was felt that his CP was noncardiac. He was placed on Protonix for possible GERD. He had no recurrent CP and no post cath complications. Vital signs and renal function remained stable. He was last seen and examined by Dr. Alain Honey who determined he was stable for discharge home. He will f/u with Dr. [name] in [place].    Consults: None  Significant Diagnostic Studies:  Procedures    Left Heart Cath and Coronary Angiography    Conclusion    1. Patent stent mid LAD with no restenosis.  2. Mild to moderate mid LAD stenosis beyond the stent, does not appear to be flow limiting.  3. No obstructive disease in the Circumflex or RCA 4. Normal LV systolic function        2D Echo 09/20/14 Normal EF- 55-60%.   Treatments: See Hospital Course  Discharge  Exam: Blood pressure 106/63, pulse 83, temperature 97.9 F (36.6 C), temperature source Oral, resp. rate 16, height 6' 2"  (1.88 m), weight 231 lb 14.8 oz (105.2 kg), SpO2 98 %.   Disposition: 01-Home or Self Care     Medication List    TAKE these medications        acetaminophen 500 MG tablet  Commonly known as:  TYLENOL  Take 1,000 mg by mouth daily as needed for headache. Pain.     ASPIR-LOW 81 MG EC tablet  Generic drug:  aspirin  Take 81 mg by mouth daily.     atorvastatin 40 MG tablet  Commonly known as:  LIPITOR  Take 1 tablet (40 mg total) by mouth daily.     buPROPion 150 MG 12 hr tablet  Commonly known as:  WELLBUTRIN SR  TAKE ONE TABLET BY MOUTH FOR 3 DAYS; THEN TAKE ONE TABLET TWICE A DAYTHEREAFTER     cefdinir 300 MG capsule  Commonly known as:  OMNICEF  Take 1 capsule (300 mg total) by mouth 2 (two) times daily.     diltiazem 30 MG tablet  Commonly known as:  CARDIZEM  Take 1 tablet (30 mg total) by mouth 2 (two) times daily.     enalapril 10 MG tablet  Commonly known as:  VASOTEC  TAKE ONE (1) TABLET AT BEDTIME     gabapentin 300 MG capsule  Commonly known as:  NEURONTIN  Take 1 capsule (300 mg  total) by mouth 4 (four) times daily.     glipiZIDE 5 MG tablet  Commonly known as:  GLUCOTROL  TAKE ONE TABLET BY MOUTH TWICE A DAY BEFORE A MEAL     HYDROcodone-acetaminophen 7.5-325 MG tablet  Commonly known as:  NORCO  Take 1 tablet by mouth every 4 (four) hours as needed for moderate pain.     INVOKANA 300 MG Tabs tablet  Generic drug:  canagliflozin  TAKE ONE (1) TABLET EACH DAY BEFORE BREAKFAST     meclizine 25 MG tablet  Commonly known as:  ANTIVERT  Take 1 tablet (25 mg total) by mouth 3 (three) times daily as needed for dizziness or nausea.     nitroGLYCERIN 0.4 MG SL tablet  Commonly known as:  NITROSTAT  Place 1 tablet (0.4 mg total) under the tongue every 5 (five) minutes as needed for chest pain.     pantoprazole 40 MG tablet    Commonly known as:  PROTONIX  TAKE ONE (1) TABLET EACH DAY     ranitidine 150 MG tablet  Commonly known as:  ZANTAC  Take 150 mg by mouth daily as needed for heartburn.     temazepam 30 MG capsule  Commonly known as:  RESTORIL  Take 1 capsule (30 mg total) by mouth at bedtime as needed for sleep.       Follow-up Information    Follow up with Rozann Lesches, MD.   Specialty:  Cardiology   Why:  our office will call you with a follow-up appointment    Contact information:   [place] [id number] [phone]      TIME SPENT ON DISCHARGE, INCLUDING PHYSICIAN TIME: >30 MINUTES   Signed: Lyda Jester 09/21/2015, 3:22 PM

## 2015-09-21 NOTE — Progress Notes (Signed)
Patient Profile: 55 y/o male with known CAD s/p DES to the mid LAD 12/09/13, admitted for recurrent unstable angina. He ruled out for NSTEMI with negative enzymes.    Subjective: No complaints. Denies any recurrent CP. He felt a bit anxious/ SOB this morning but symptoms have resolved.   Objective: Vital signs in last 24 hours: Temp:  [97.4 F (36.3 C)-98.6 F (37 C)] 97.8 F (36.6 C) (01/04 0301) Pulse Rate:  [77-98] 77 (01/04 0301) Resp:  [8-45] 14 (01/04 0301) BP: (102-127)/(67-86) 103/67 mmHg (01/04 0301) SpO2:  [90 %-99 %] 95 % (01/04 0301) Weight:  [231 lb 11.2 oz (105.098 kg)-235 lb (106.595 kg)] 231 lb 14.8 oz (105.2 kg) (01/04 0301) Last BM Date: 09/19/15  Intake/Output from previous day: 01/03 0701 - 01/04 0700 In: 639.1 [P.O.:480; I.V.:159.1] Out: -  Intake/Output this shift: Total I/O In: 564.1 [P.O.:480; I.V.:84.1] Out: -   Medications Current Facility-Administered Medications  Medication Dose Route Frequency Provider Last Rate Last Dose  . 0.9 %  sodium chloride infusion   Intravenous Continuous Kinnie Feil, MD 75 mL/hr at 09/20/15 1349    . acetaminophen (TYLENOL) tablet 650 mg  650 mg Oral Q6H PRN Kinnie Feil, MD       Or  . acetaminophen (TYLENOL) suppository 650 mg  650 mg Rectal Q6H PRN Kinnie Feil, MD      . aspirin EC tablet 325 mg  325 mg Oral Daily Kinnie Feil, MD   325 mg at 09/20/15 1507  . atorvastatin (LIPITOR) tablet 40 mg  40 mg Oral Daily Kinnie Feil, MD   40 mg at 09/20/15 1507  . buPROPion [facility] SR) 12 hr tablet 150 mg  150 mg Oral BID Kinnie Feil, MD   150 mg at 09/20/15 2130  . cefUROXime (CEFTIN) tablet 500 mg  500 mg Oral BID Kinnie Feil, MD   500 mg at 09/20/15 2131  . diltiazem (CARDIZEM) tablet 30 mg  30 mg Oral BID Kinnie Feil, MD   30 mg at 09/20/15 2131  . gabapentin (NEURONTIN) capsule 300 mg  300 mg Oral QID Kinnie Feil, MD   300 mg at 09/20/15 2131  . HYDROcodone-acetaminophen  (NORCO) 7.5-325 MG per tablet 1 tablet  1 tablet Oral Q4H PRN Kinnie Feil, MD   1 tablet at 09/20/15 1857  . insulin aspart (novoLOG) injection 0-9 Units  0-9 Units Subcutaneous TID WC Kinnie Feil, MD      . morphine 2 MG/ML injection 2 mg  2 mg Intravenous Q1H PRN [name], MD      . nitroGLYCERIN (NITROSTAT) SL tablet 0.4 mg  0.4 mg Sublingual Q5 min PRN Evalee Jefferson, PA-C   0.4 mg at 09/20/15 5830  . nitroGLYCERIN 50 mg in dextrose 5 % 250 mL (0.2 mg/mL) infusion  3 mcg/min Intravenous Titrated Erlene Quan, PA-C 3 mL/hr at 09/20/15 2000 10 mcg/min at 09/20/15 2000  . ondansetron (ZOFRAN) injection 4 mg  4 mg Intravenous Q6H PRN [name], MD      . oxyCODONE-acetaminophen (PERCOCET/ROXICET) 5-325 MG per tablet 1-2 tablet  1-2 tablet Oral Q4H PRN [name], MD   2 tablet at 09/20/15 2130  . pantoprazole (PROTONIX) EC tablet 40 mg  40 mg Oral Daily Kinnie Feil, MD   40 mg at 09/20/15 1508    PE: General appearance: alert, cooperative and no distress Neck: no carotid bruit and no JVD  Lungs: clear to auscultation bilaterally Heart: regular rate and rhythm, S1, S2 normal, no murmur, click, rub or gallop Extremities: no LEE Pulses: 2+ and symmetric Skin: warm and dry Neurologic: Grossly normal  Lab Results:   Recent Labs  09/20/15 0912 09/21/15 0254  WBC 6.6 8.1  HGB 16.3 15.8  HCT 45.6 45.8  PLT 145* 155   BMET  Recent Labs  09/20/15 0912 09/21/15 0254  NA 138 138  K 4.1 4.3  CL 103 101  CO2 23 28  GLUCOSE 255* 163*  BUN 19 14  CREATININE 0.72 0.95  CALCIUM 9.7 9.3   Cardiac Panel (last 3 results)  Recent Labs  09/20/15 0912 09/20/15 1337  TROPONINI <0.03 <0.03    Studies/Results: Procedures    Left Heart Cath and Coronary Angiography    Conclusion    1. Patent stent mid LAD with no restenosis.  2. Mild to moderate mid LAD stenosis beyond the stent, does not appear to be flow limiting.  3. No  obstructive disease in the Circumflex or RCA 4. Normal LV systolic function       Assessment/Plan    Principal Problem:   Accelerating angina [facility]) Active Problems:   Hyperlipidemia LDL goal <70   Essential hypertension, benign   CAD S/P LAD DES March 2015   Type 2 diabetes mellitus (HCC)   Chest pain   Pain in the chest   1. Chest Pain: cardiac enzymes negative. LHC yesterday showed patent stent in mid LAD with no restenosis, mild to moderate mid LAD stenosis beyond the stent but a non flow limiting lesion. Normal Circ and RCA. NL LVF. No arrthymias on telemetry. ? GI etiology. Protonix added, 40 mg daily. No recurrent pain overnight.   2. CAD: s/p LAD PCI + stenting in 2015. LHC with patent stent and nonobstructive CAD. Continue medical therapy for secondary prevention. Continue ASA and statin.   3. HTN: BP is well controlled on current regimen.  4. HLD: LDL is at goal at 39 mg/dL. Continue Lipitor.   5. Post Cath: renal function and right radial cath site both stable. VSS  Dispo: likely discharge home today.    LOS: 1 day    Fabion Gatson M. Rosita Fire, PA-C 09/21/2015 6:28 AM

## 2015-09-22 ENCOUNTER — Telehealth: Payer: Self-pay | Admitting: Cardiovascular Disease

## 2015-09-22 NOTE — Telephone Encounter (Signed)
His recent cath looked ok, his chest pain did not appear to be cardiac at that time. If better with prn anxiety medications seems less likely to be heart related as well. Can try prn nitroglycerin if symptoms reoccur. His echo looks good, normal heart function. At this time would keep regular scheduled follow up, if symptoms progress then would need to reconsider ER evaluation.    Zandra Abts MD

## 2015-09-22 NOTE — Telephone Encounter (Signed)
Wife [name]) notified & verbalized understanding.  He does already have prn Nitroglycerin at home.  Reminded on proper use of this medication.

## 2015-09-22 NOTE — Telephone Encounter (Signed)
Patient was discharged 09/21/15 from [facility]. Around 5am patient awoke with chest pressure with some shortness of breath. States that he had some dizziness last pm.  Wants to know if we can give results of his recent echo.

## 2015-09-22 NOTE — Telephone Encounter (Signed)
Returned call to wife Jared Griffin)..  Did not feel like he needed to go to ED.  Thought may haye been his nerves, took nerve pill & pressure eased off, but did not totally go away.  Does have NTG, but did not take any.  No dizziness now.  SOB has been the same, but seemed worse last pm.  SOB seems to be okay this morning.   Has follow up scheduled on 10/05/15 at 10:40 with Dr. Bronson Ing [facility] office.  Wife aware that message will be sent to provider for further advice.

## 2015-09-23 MED FILL — Morphine Sulfate Inj 10 MG/ML: INTRAMUSCULAR | Qty: 1 | Status: AC

## 2015-09-27 ENCOUNTER — Encounter: Payer: Self-pay | Admitting: Family Medicine

## 2015-09-27 ENCOUNTER — Ambulatory Visit (INDEPENDENT_AMBULATORY_CARE_PROVIDER_SITE_OTHER): Payer: PRIVATE HEALTH INSURANCE | Admitting: Family Medicine

## 2015-09-27 VITALS — BP 122/78 | Ht 74.0 in | Wt 237.4 lb

## 2015-09-27 DIAGNOSIS — R079 Chest pain, unspecified: Secondary | ICD-10-CM | POA: Diagnosis not present

## 2015-09-27 DIAGNOSIS — F329 Major depressive disorder, single episode, unspecified: Secondary | ICD-10-CM | POA: Diagnosis not present

## 2015-09-27 DIAGNOSIS — F411 Generalized anxiety disorder: Secondary | ICD-10-CM | POA: Diagnosis not present

## 2015-09-27 DIAGNOSIS — F32A Depression, unspecified: Secondary | ICD-10-CM

## 2015-09-27 MED ORDER — ALPRAZOLAM 0.5 MG PO TABS: ORAL_TABLET | ORAL | Status: DC

## 2015-09-27 MED ORDER — BUPROPION HCL ER (SR) 150 MG PO TB12: ORAL_TABLET | ORAL | Status: DC

## 2015-09-27 NOTE — Progress Notes (Signed)
   Subjective:    Patient ID: Jared Griffin, male    DOB: Jun 10, 1961, 55 y.o.   MRN: [id number]  HPI Patient arrives for a follow up from a recent hospitalization for angina. Patient following up with cardiology next week. Patient reports feeling anxious, with severe fatigue and no energy to do anything.  Poor enery  Still feels chest discomfort, and chest discomfort is sharp at times, feels tender , transient,  Echo strong  Dizziness intermittently at times, feels high and a bit out of it,has spinning like sensation.  Sharp pain low stomach, aff and on.  Very significant stress t work, pt having more anxiety, feels uptight and irritated and anxios  Complete recent hospital record and tests all reviewed and presence of patient.  Having a lot of stress at work. Feeling more more anxious with it. Feels he does not have the energy or the mental status or the physical energy to handle work at this time.   Still [place] chest discomfort transient at times sharp at times aching anterior chest sometimes associated with anxious feelings. Sometimes not. Often not associated with any type of exertion.  Also stress at home with wife with chronic illness Review of Systems No headache no current chest pain no current abdominal pain no change in bowel habits    Objective:   Physical Exam Alert anxious appearing vital stable HET normal some chest wall tenderness left anterior chest lungs clear heart regular rate and rhythm abdomen benign       Assessment & Plan:  Impression #1 recent full workup including cardiac catheter chest pain #2 known coronary artery disease #3 atypical chest pain #4 substantial anxiety #5 substantial stress at work discussed plan work excuse written. Encouraged to resume Wellbutrin rationale discussed at great length. Xanax when necessary. Follow-up with cardiologist. Follow-up. Next month. Patient brought up question regarding potential long-term disability. Long  discussion held in this regard to regarding the bureaucratic and legalistic nature of this. It is certainly true his job is very stressful and has a known cardiac patient along with the tendency towards anxiety/depression this is become very challenging for him. 40 minutes spent most in discussion and analysis of chart and records [facility]

## 2015-09-28 ENCOUNTER — Ambulatory Visit: Payer: PRIVATE HEALTH INSURANCE | Admitting: Family Medicine

## 2015-10-04 ENCOUNTER — Ambulatory Visit (INDEPENDENT_AMBULATORY_CARE_PROVIDER_SITE_OTHER): Payer: PRIVATE HEALTH INSURANCE | Admitting: Family Medicine

## 2015-10-04 ENCOUNTER — Encounter: Payer: Self-pay | Admitting: Family Medicine

## 2015-10-04 VITALS — BP 130/84 | Ht 74.0 in | Wt 237.0 lb

## 2015-10-04 DIAGNOSIS — I10 Essential (primary) hypertension: Secondary | ICD-10-CM | POA: Diagnosis not present

## 2015-10-04 DIAGNOSIS — F411 Generalized anxiety disorder: Secondary | ICD-10-CM

## 2015-10-04 DIAGNOSIS — R079 Chest pain, unspecified: Secondary | ICD-10-CM | POA: Diagnosis not present

## 2015-10-04 NOTE — Progress Notes (Signed)
   Subjective:    Patient ID: Jared Griffin, male    DOB: [birth year], 55 y.o.   MRN: [id number] Patient returns for a protracted discussion regarding multiple concerns. Please see the note of  7 days ago HPIFollow up on disability issues.   bp has been running high. Still having some chest discomfort. Shooting pains that have woke him at night last week. Sees cardiologist tomorrow.   syst 133 over 99  Still experiencing chest discomfort. Sharp in nature. Just last a few seconds. Generally not associated with exertion associated nausea or diaphoresis  Cath report and specialist assessment reviewed once again with patient.  Having a lot of anxiety and stress. Also mentioned some suicidal ideation. Developed no plan but was having thoughts of "what's the use". States overall that is a challenge. It started coming back at him when he looked it is coming schedule as a Tax adviser in the [employer]. He feels overwhelmed right now with his own physical and mental issues and feels her is no way he can go back to work at this time. Not acutely suicidal or homicidal  Sharp shooting pain strikes the ches, wakes pt up some night s    Review of Systems No headache positive chest pain no major shortness breath no nausea no diaphoresis no abdominal pain    Objective:   Physical Exam  Alert vitals stable HET normal lungs clear heart rare rhythm chest wall nontender abdomen benign  Catheterization reviewed hospital records reviewed      Assessment & Plan:  Impression chest pain likely noncardiac discuss, however will see cardiologist tomorrow and of course their final recommendation we will endorse area catheterization revealed a likely non-hemodynamic stenosis beyond the site of stenting discussed once, patient #2 anxiety with element of depression. Concerning with suicidal ideation. States not actively film this way now. Would prefer not to see a psychiatrist. Just started Wellbutrin back  also substantial anxiety discussed #3 hypertension suboptimal but fair control now today will not change meds plan maintain Wellbutrin May increase Xanax to 3 times a day. Work excuse next 30 days. Patient advised warning signs carefully as far as worsening of suicidal ideation. Patient to call workplace and set up counseling offered through his human resources [facility]

## 2015-10-05 ENCOUNTER — Encounter: Payer: Self-pay | Admitting: Cardiovascular Disease

## 2015-10-05 ENCOUNTER — Ambulatory Visit (INDEPENDENT_AMBULATORY_CARE_PROVIDER_SITE_OTHER): Payer: PRIVATE HEALTH INSURANCE | Admitting: Cardiovascular Disease

## 2015-10-05 VITALS — BP 100/78 | HR 94 | Ht 74.0 in | Wt 236.0 lb

## 2015-10-05 DIAGNOSIS — Z87898 Personal history of other specified conditions: Secondary | ICD-10-CM

## 2015-10-05 DIAGNOSIS — I2510 Atherosclerotic heart disease of native coronary artery without angina pectoris: Secondary | ICD-10-CM | POA: Diagnosis not present

## 2015-10-05 DIAGNOSIS — F419 Anxiety disorder, unspecified: Secondary | ICD-10-CM

## 2015-10-05 DIAGNOSIS — E782 Mixed hyperlipidemia: Secondary | ICD-10-CM | POA: Diagnosis not present

## 2015-10-05 DIAGNOSIS — R079 Chest pain, unspecified: Secondary | ICD-10-CM

## 2015-10-05 DIAGNOSIS — I10 Essential (primary) hypertension: Secondary | ICD-10-CM

## 2015-10-05 DIAGNOSIS — Z9289 Personal history of other medical treatment: Secondary | ICD-10-CM

## 2015-10-05 DIAGNOSIS — Z566 Other physical and mental strain related to work: Secondary | ICD-10-CM

## 2015-10-05 NOTE — Progress Notes (Signed)
Patient ID: Jared Griffin, male   DOB: 1961/04/21, 55 y.o.   MRN: [id number]      SUBJECTIVE: The patient is a 55 yr old male with a h/o CAD with LAD PCI in 11/2013, hypertension, hyperlipidemia, diabetes, and GERD.   He was recently hospitalized for chest pain similar to prior angina. He ruled out for acute coronary syndrome with normal troponins.   Due to persistent chest pain, he ultimately underwent coronary angiography on 09/19/14 which showed a patent stent in the mid LAD with mild to moderate mid LAD stenosis of 40% beyond the stent but which was not flow limiting. The circumflex and RCA were normal.  Echocardiography demonstrated normal left ventricle systolic function, EF 67-61%, with normal regional wall motion and normal diastolic function and mild mitral regurgitation.   It was felt that his chest pain was noncardiac in etiology and was started on Protonix for possible GERD.  He saw his PCP yesterday and the notes mention that the patient has been experiencing a lot of anxiety and stress and also mentioned some suicidal ideation, but had not developed a plan and was not acutely suicidal.  He continues to have chest pressure and said he has severe anxiety and is applying for disability and getting out of law enforcement altogether. He is awaiting a call from a behavioral therapist. He has been experiencing dizziness. His blood pressure has been as high as 148/97 over the weekend.  He is here with his wife, Jared Griffin.  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 Married to Jared Griffin, also my patient.   Review of Systems: As per "subjective", otherwise negative.  Allergies  Allergen Reactions  . Metoprolol Rash    Current Outpatient Prescriptions  Medication Sig Dispense Refill  . acetaminophen (TYLENOL) 500 MG tablet Take 1,000 mg by mouth daily as needed for headache. Pain.    Marland Kitchen ALPRAZolam (XANAX) 0.5 MG tablet Take one tablet daily as needed for anxiety 30 tablet 0  .  aspirin (ASPIR-LOW) 81 MG EC tablet Take 81 mg by mouth daily.     Marland Kitchen atorvastatin (LIPITOR) 40 MG tablet Take 1 tablet (40 mg total) by mouth daily. 30 tablet 6  . buPROPion (WELLBUTRIN SR) 150 MG 12 hr tablet TAKE ONE TABLET BY MOUTH FOR 3 DAYS; THEN TAKE ONE TABLET TWICE A DAY THERE AFTER 60 tablet 5  . diltiazem (CARDIZEM) 30 MG tablet Take 1 tablet (30 mg total) by mouth 2 (two) times daily. 60 tablet 5  . enalapril (VASOTEC) 10 MG tablet TAKE ONE (1) TABLET AT BEDTIME 30 tablet 5  . gabapentin (NEURONTIN) 300 MG capsule Take 1 capsule (300 mg total) by mouth 4 (four) times daily. 120 capsule 5  . glipiZIDE (GLUCOTROL) 5 MG tablet TAKE ONE TABLET BY MOUTH TWICE A DAY BEFORE A MEAL 60 tablet 5  . HYDROcodone-acetaminophen (NORCO) 7.5-325 MG per tablet Take 1 tablet by mouth every 4 (four) hours as needed for moderate pain. 30 tablet 0  . INVOKANA 300 MG TABS tablet TAKE ONE (1) TABLET EACH DAY BEFORE BREAKFAST 30 tablet 0  . meclizine (ANTIVERT) 25 MG tablet Take 1 tablet (25 mg total) by mouth 3 (three) times daily as needed for dizziness or nausea. 30 tablet 1  . metFORMIN (GLUCOPHAGE) 500 MG tablet Take 500 mg by mouth 3 (three) times daily.    . nitroGLYCERIN (NITROSTAT) 0.4 MG SL tablet Place 1 tablet (0.4 mg total) under the tongue every 5 (five) minutes as needed  for chest pain. 25 tablet 3  . pantoprazole (PROTONIX) 40 MG tablet TAKE ONE (1) TABLET EACH DAY 30 tablet 5  . ranitidine (ZANTAC) 150 MG tablet Take 150 mg by mouth daily as needed for heartburn.    . temazepam (RESTORIL) 30 MG capsule Take 1 capsule (30 mg total) by mouth at bedtime as needed for sleep. 30 capsule 0   No current facility-administered medications for this visit.    Past Medical History  Diagnosis Date  . Essential hypertension, benign   . Mixed hyperlipidemia 2006  . 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 Arthritis   . Fatty liver disease, nonalcoholic   . Coronary atherosclerosis of  native coronary artery     a. 12/09/2013: s/p PCI with 3.0 x 28 Promus DES to mLAD  . Osgood-Schlatter's disease of right knee   . Type 2 diabetes mellitus ([place])   . Obesity     Past Surgical History  Procedure Laterality Date  . Liver biopsy    . Knee arthroplasty Right 1999  . Colonoscopy  06/19/2012    Procedure: COLONOSCOPY;  Surgeon: [name], MD;  Location: [facility] ENDO SUITE;  Service: Endoscopy;  Laterality: N/A;  930  . Cardiac catheterization  2011  . Cholecystectomy  2003  . Tonsillectomy  1970's  . Left heart catheterization with coronary angiogram N/A 12/09/2013    Procedure: LEFT HEART CATHETERIZATION WITH CORONARY ANGIOGRAM;  Surgeon: [name], MD;  Location: [facility] CATH LAB;  Service: Cardiovascular;  Laterality: N/A;  . Cardiac catheterization N/A 09/20/2015    Procedure: Left Heart Cath and Coronary Angiography;  Surgeon: [name], MD;  Location: [place] CV LAB;  Service: Cardiovascular;  Laterality: N/A;    Social History   Social History  . Marital Status: Married    Spouse Name: N/A  . Number of Children: N/A  . Years of Education: N/A   Occupational History  . Not on file.   Social History Main Topics  . Smoking status: Former Smoker -- 0.50 packs/day for 10 years    Types: Cigarettes    Start date: 02/02/1973    Quit date: 09/17/1983  . Smokeless tobacco: Former Systems developer    Types: [place] date: 09/17/1983  . Alcohol Use: No  . Drug Use: No  . Sexual Activity: Yes   Other Topics Concern  . 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    Married with 1 child   Right handed    12 th    Very little caffeine     Filed Vitals:   10/05/15 1046  BP: 100/78  Pulse: 94  Height: 6' 2"  (1.88 m)  Weight: 236 lb (107.049 kg)  SpO2: 93%    PHYSICAL EXAM General: NAD HEENT: Normal. Neck: No JVD, no thyromegaly. Lungs: Clear to  auscultation bilaterally with normal respiratory effort. CV: Nondisplaced PMI.  Regular rate and rhythm, normal S1/S2, no S3/S4, no murmur. No pretibial or periankle edema.   Abdomen: Soft, nontender, no distention.  Neurologic: Alert and oriented x 3.  Psych: Flat affect. Skin: Normal. Musculoskeletal: No gross deformities. Extremities: No clubbing or cyanosis.   ECG: Most recent ECG reviewed.      ASSESSMENT AND PLAN: 1. CAD and LAD stent in 11/2013: Symptomatically stable. Stent is patent. 40% mid LAD stenosis is not the etiology of his chest  pain. No changes to therapy.  2. Essential HTN: Well controlled on enalapril. No changes.  3. Hyperlipidemia: On Lipitor with good control of LDL on 02/12/15. TG mildly elevated. Dietary and exercise counseling previously provided. No changes to medical therapy.  4. Chest pain with severe anxiety and stress: Noncardiac, due to significant anxiety and stress. Recommend behavioral therapy and formal psychiatric evaluation. These are significant risk factors for CAD and increase the incidence of both MI and arrhythmias. I have recommended he find something else to do, be it a part-time job or hobby if possible. 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  Dispo: f/u 1 year.   Kate Sable, M.D., F.A.C.C.

## 2015-10-05 NOTE — Patient Instructions (Signed)
Continue all current medications. Your physician wants you to follow up in:  1 year.  You will receive a reminder letter in the mail one-two months in advance.  If you don't receive a letter, please call our office to schedule the follow up appointment   

## 2015-10-06 ENCOUNTER — Telehealth: Payer: Self-pay | Admitting: Cardiovascular Disease

## 2015-10-06 ENCOUNTER — Telehealth: Payer: Self-pay | Admitting: Family Medicine

## 2015-10-06 NOTE — Telephone Encounter (Signed)
Patient states the forms we filled out say his disability is only temporary (stress and anxiety) and they want it listed as permanent They want the forms to say he must take early medical retirement due to medical issues/diability and they want all of his chronic medical issues listed as the reason he must do early medical retirement not just stress and anxiety.

## 2015-10-06 NOTE — Telephone Encounter (Signed)
Family wants your opinion on what you think the best way to proceed would be at this point. Patient wants to go out not on disability but early medical retirement due to his line of work and its significant stress and medical issues. Patient will have to go before the state medical board to have this request granted and wants to know your professional medical opinion on how to proceed to make this a reality and get this approved. They are willing to proceed how you feel it will go the smoothest for the patient.

## 2015-10-06 NOTE — Telephone Encounter (Signed)
Pt is requesting a call from Dr. Richardson Landry. Pt is confused about the forms that he had filled out. Pt states that the Dr. Richardson Landry stated that the pt should go out on disability retirement but the forms are stating otherwise. Pt also states that all his other medical issues are not on the paper work either. Please advise.

## 2015-10-06 NOTE — Telephone Encounter (Signed)
Spouse here today to discuss the papers. She states that since Saturday 1/14 he  Has had high BP an slurred speech periodically when his BP seems to be up. He gets  Really red in the face an then starts slurring his speech, once the BP goes down the  Speech corrects itself.    I have him on the schedule for 1/27 but do you think he needs to be seen sooner?   Please advise

## 2015-10-06 NOTE — Telephone Encounter (Signed)
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 They have a brand new form that they can bring up here to have filled out again

## 2015-10-06 NOTE — Telephone Encounter (Signed)
Nurse i am going to request yuou spk directly to patient, and document his response and send back to me,  i did thorough protracted eval covering all factors of his health twice in thre last two wks, f u as sched, if he wishes to change his f u to early next wk from current, we can do that

## 2015-10-06 NOTE — Telephone Encounter (Signed)
Wife stated that patient had another "spell" where is had slurred speech that lasted abotu 15 minutes, but then did not remember it happening

## 2015-10-06 NOTE — Telephone Encounter (Signed)
Let them know we will do this as they reuested, also let them know that frequently the insurance companies push back when we declare something "permanently disabling" too early, they in turn will ask how we know the depression anxidety heart complications etc. Is permanent and we don't always have a good answer for that only a few weeks into work excuse. Also we will list the other issues tho they are not disabling, sometimes listing chronic nondisabling concerns dilutes the message and leads to the insur companies fighting back. erica to see this, do her part , get back to me f u on 27th

## 2015-10-06 NOTE — Telephone Encounter (Signed)
Returned call to wife Jared Griffin) - wanted to discuss paper work that was given to pmd for disability.  Stated the family MD is the one that had initially suggested that he come out on medical retirement.  Stated now that they got the papers, it does not say that.  Advised her to discuss further with them since they would be the ones to handle this.  She request copy of office note from yesterdays visit with Dr. Bronson Ing.  Informed her that she would need to come by office to sign release first.  Wife verbalized understanding.

## 2015-10-07 NOTE — Telephone Encounter (Signed)
Ok . Burnis Medin do it, no extra charge, erica do your part first but leave open anything yer unsure of

## 2015-10-14 ENCOUNTER — Encounter: Payer: Self-pay | Admitting: Family Medicine

## 2015-10-14 ENCOUNTER — Ambulatory Visit (INDEPENDENT_AMBULATORY_CARE_PROVIDER_SITE_OTHER): Payer: PRIVATE HEALTH INSURANCE | Admitting: Family Medicine

## 2015-10-14 ENCOUNTER — Other Ambulatory Visit: Payer: Self-pay | Admitting: Family Medicine

## 2015-10-14 VITALS — BP 122/80 | Ht 74.0 in | Wt 240.4 lb

## 2015-10-14 DIAGNOSIS — E1143 Type 2 diabetes mellitus with diabetic autonomic (poly)neuropathy: Secondary | ICD-10-CM | POA: Diagnosis not present

## 2015-10-14 DIAGNOSIS — F411 Generalized anxiety disorder: Secondary | ICD-10-CM | POA: Diagnosis not present

## 2015-10-14 DIAGNOSIS — F329 Major depressive disorder, single episode, unspecified: Secondary | ICD-10-CM | POA: Diagnosis not present

## 2015-10-14 DIAGNOSIS — I2510 Atherosclerotic heart disease of native coronary artery without angina pectoris: Secondary | ICD-10-CM | POA: Diagnosis not present

## 2015-10-14 DIAGNOSIS — Z9861 Coronary angioplasty status: Secondary | ICD-10-CM

## 2015-10-14 DIAGNOSIS — F32A Depression, unspecified: Secondary | ICD-10-CM

## 2015-10-14 MED ORDER — ALPRAZOLAM 0.5 MG PO TABS: ORAL_TABLET | ORAL | Status: DC

## 2015-10-14 NOTE — Progress Notes (Signed)
   Subjective:    Patient ID: Jared Griffin, male    DOB: 1961-08-15, 55 y.o.   MRN: [id number]  HPI Patient in today to have forms filled out for Disability. Patient would also like to discuss diabetic medications.   Patient arrives for a lengthy discussion of all of his concerns. Continues to experience chest symptoms. Continues to be managed by the cardiologist. Chest pain shortness breath, going. He does admit it generally tends to occur when he gets anxious or worried or concerned.  Still having spells of anxiety and feeling down. No active suicidal thoughts. Compliant with Wellbutrin. He went on to see a mental health counselor as provided through his EAP program at work. They recommended he see a psychiatrist.  Patient also went on to see Workmen's Comp. Doctor. When expressing his mental health side obtains, they recommended a psychiatrist also  Patient states his suicidal and transient thoughts of improved. Still having high anxiety and rapid breathing and rapid heart rate anytime he thinks about work or talks to anybody about his work.  Reports compliance with diabetes medicine sugar in generally good control   Review of Systems No headache positive chest pain positive abdominal pain no change in bowel habits no blood in stool    Objective:   Physical Exam  Alert vital stable distress H&T normal. Lungs clear heart rare rhythm somewhat subdued affect      Assessment & Plan:  Impression coronary artery disease. With recent stenting. Ongoing symptomatology. Patient's cardiologist has advised him he needs to find another line of work. 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 #2 depression/anxiety some improvement but not ideal. Has really become disabling in and of itself. Unable to perform duties as Engineer, structural due to bilious challenges. Claims compliance with meds discussed #3 type 2 diabetes ongoing stable control per patient plan  25 minutes spent most in discussion. We will fill out disability form for his human resources once again. Psych iatry referral rationale discussedWSL

## 2015-10-19 ENCOUNTER — Encounter: Payer: Self-pay | Admitting: Family Medicine

## 2015-10-19 DIAGNOSIS — F4323 Adjustment disorder with mixed anxiety and depressed mood: Secondary | ICD-10-CM

## 2015-10-20 ENCOUNTER — Encounter: Payer: Self-pay | Admitting: Family Medicine

## 2015-10-24 ENCOUNTER — Telehealth (HOSPITAL_COMMUNITY): Payer: Self-pay | Admitting: *Deleted

## 2015-11-01 ENCOUNTER — Ambulatory Visit (INDEPENDENT_AMBULATORY_CARE_PROVIDER_SITE_OTHER): Payer: PRIVATE HEALTH INSURANCE | Admitting: Family Medicine

## 2015-11-01 ENCOUNTER — Encounter: Payer: Self-pay | Admitting: Family Medicine

## 2015-11-01 VITALS — BP 120/84 | Ht 74.0 in | Wt 241.0 lb

## 2015-11-01 DIAGNOSIS — F329 Major depressive disorder, single episode, unspecified: Secondary | ICD-10-CM | POA: Diagnosis not present

## 2015-11-01 DIAGNOSIS — Z79899 Other long term (current) drug therapy: Secondary | ICD-10-CM | POA: Diagnosis not present

## 2015-11-01 DIAGNOSIS — F32A Depression, unspecified: Secondary | ICD-10-CM

## 2015-11-01 DIAGNOSIS — I10 Essential (primary) hypertension: Secondary | ICD-10-CM | POA: Diagnosis not present

## 2015-11-01 DIAGNOSIS — E119 Type 2 diabetes mellitus without complications: Secondary | ICD-10-CM

## 2015-11-01 DIAGNOSIS — E785 Hyperlipidemia, unspecified: Secondary | ICD-10-CM

## 2015-11-01 LAB — POCT GLYCOSYLATED HEMOGLOBIN (HGB A1C): Hemoglobin A1C: 7.8

## 2015-11-01 MED ORDER — GLIPIZIDE 5 MG PO TABS: ORAL_TABLET | ORAL | Status: DC

## 2015-11-01 NOTE — Progress Notes (Signed)
   Subjective:    Patient ID: Jared Griffin, male    DOB: June 29, 1961, 55 y.o.   MRN: [id number]  Diabetes He presents for his follow-up diabetic visit. He has type 2 diabetes mellitus. He is compliant with treatment all of the time. He is following a generally healthy diet. (Blood sugar readings 130 -160) Eye exam is current.  A1C today 7.8 Patient claims complete compliance with diabetes medicine. Admits his diet is not the best.  Compliant with his blood pressure medicine. Generally does not miss a dose. No obvious side effects. Blood pressure generally good when checked elsewhere.  Lipid control discussed. Compliant with lipid medicine. Even more important with recent progression of coronary artery disease. Prior lab work reviewed. Patient due for more at this time.   Morn numbers up lately, glu's rising  Trying to walk more,   Dr [name] tomorrow, due to seept notes feeling on edge, having trouble sleeping, early morn awakwning  Follow up  On anxiety and stress. No suicidal thoughts  Review of Systems No headache, no major weight loss or weight gain, no chest pain no back pain abdominal pain no change in bowel habits complete ROS otherwise negative     Objective:   Physical Exam Alert vitals stable blood pressure good on repeat HEENT normal. Lungs clear heart rare rhythm ankles without edema       Assessment & Plan:  Impression #1 type 2 diabetes suboptimum control discussed length #2 hyperlipidemia status and certain types more blood work meds reviewed #3 coronary artery disease with recent stenting #4 anxiety depression ongoing challenges due to see psychiatrist tomorrow. #5 hypertension good control meds reviewed to maintain same plan appropriate blood work medicines refilled increase Glucotrol to one and half tablets twice a day rationale discussed WSL

## 2015-11-05 LAB — LIPID PANEL
Chol/HDL Ratio: 4.3 ratio units (ref 0.0–5.0)
Cholesterol, Total: 126 mg/dL (ref 100–199)
HDL: 29 mg/dL — ABNORMAL LOW (ref >39)
LDL Calculated: 30 mg/dL (ref 0–99)
Triglycerides: 336 mg/dL — ABNORMAL HIGH (ref 0–149)
VLDL Cholesterol Cal: 67 mg/dL — ABNORMAL HIGH (ref 5–40)

## 2015-11-05 LAB — HEPATIC FUNCTION PANEL
ALT: 50 IU/L — ABNORMAL HIGH (ref 0–44)
AST: 30 IU/L (ref 0–40)
Albumin: 4.4 g/dL (ref 3.5–5.5)
Alkaline Phosphatase: 40 IU/L (ref 39–117)
Bilirubin Total: 0.8 mg/dL (ref 0.0–1.2)
Bilirubin, Direct: 0.25 mg/dL (ref 0.00–0.40)
Total Protein: 6.9 g/dL (ref 6.0–8.5)

## 2015-11-06 ENCOUNTER — Encounter: Payer: Self-pay | Admitting: Family Medicine

## 2015-11-07 ENCOUNTER — Other Ambulatory Visit: Payer: Self-pay | Admitting: Family Medicine

## 2015-11-08 DIAGNOSIS — Z029 Encounter for administrative examinations, unspecified: Secondary | ICD-10-CM

## 2015-11-14 ENCOUNTER — Other Ambulatory Visit: Payer: Self-pay | Admitting: Family Medicine

## 2015-11-21 ENCOUNTER — Other Ambulatory Visit: Payer: Self-pay | Admitting: Family Medicine

## 2015-11-30 ENCOUNTER — Encounter (HOSPITAL_COMMUNITY): Payer: Self-pay | Admitting: Psychiatry

## 2015-11-30 ENCOUNTER — Ambulatory Visit (INDEPENDENT_AMBULATORY_CARE_PROVIDER_SITE_OTHER): Payer: PRIVATE HEALTH INSURANCE | Admitting: Psychiatry

## 2015-11-30 VITALS — BP 109/78 | HR 78 | Ht 74.0 in | Wt 238.0 lb

## 2015-11-30 DIAGNOSIS — F329 Major depressive disorder, single episode, unspecified: Secondary | ICD-10-CM

## 2015-11-30 DIAGNOSIS — F32A Depression, unspecified: Secondary | ICD-10-CM

## 2015-11-30 MED ORDER — SERTRALINE HCL 50 MG PO TABS: 50.0000 mg | ORAL_TABLET | Freq: Every day | ORAL | Status: DC

## 2015-11-30 NOTE — Progress Notes (Signed)
Psychiatric Initial Adult Assessment   Patient Identification: Jared Griffin MRN:  [id number] Date of Evaluation:  11/30/2015 Referral Source: Dr. [name] Chief Complaint:   Chief Complaint    Depression; Anxiety; Establish Care     Visit Diagnosis:    ICD-9-CM ICD-10-CM   1. Depression 311 F32.9    Diagnosis:   Patient Active Problem List   Diagnosis Date Noted  . Generalized anxiety disorder [F41.1] 09/27/2015  . Chest pain [R07.9] 09/20/2015  . Pain in the chest [R07.9]   . Depression [F32.9] 01/24/2015  . Esophageal reflux [K21.9] 01/24/2015  . Preoperative cardiovascular examination [Z01.810] 06/28/2014  . Type 2 diabetes mellitus ([place]) [E11.9]   . Mixed hyperlipidemia [E78.2]   . 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 Fatty liver disease, nonalcoholic [J68.1]   . Obesity [E66.9]   . Intermediate coronary syndrome ([place]) [I20.0] 12/09/2013  . Fatty liver [K76.0] 05/05/2013  . Lumbago [M54.5] 05/21/2012  . Essential hypertension, benign [I10] 12/28/2009  . CAD S/P LAD DES March 2015 [I25.10, Z98.61] 12/28/2009  . Hyperlipidemia LDL goal <70 [E78.5] 11/25/2009  . Accelerating angina ([place]) [I20.0] 11/25/2009  . DM [E11.9] 11/24/2009  . ABDOMINAL PAIN, HX OF [Z91.89] 11/24/2009   History of Present Illness:  This patient is a 55 year old married white male lives with his wife and 38 year old son in [place]. 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  The patient was referred by his primary physician, Dr. [name], for further assessment and treatment of anxiety and depression.  The patient states that he is become increasingly depressed and anxious over the last couple of years due to work stress. He stated that he was always under pressure to get more things done than he had time to do. 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 He got to the point of hating going to work. Several years ago he had cardiac problems and had a stent placed. In January of this year he began developing dizzy spells chest pain headaches stomach aches. It got so bad that he was readmitted for cardiac catheterization but nothing new was found.  The patient was having difficulty sleeping constant worry and anxiety about his job sadness and anger. He was getting irritable with people at home. After talking to his primary care physician at length and decided to take him out on medical retirement. The patient has been on Wellbutrin SR 150 no gram twice a day for several weeks now. He had also been out in the past when his wife was sick. He has not seen any benefit from this but does feel tremendously better since he stopped working. He is also on Xanax 0.5 mg 3 times a day which he thinks has helped to some degree.  Since stopping working he's had a big turnaround. His sleep and energy have improved. 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 He is much less anxious and is no longer having the chest pain dizzy spells or stomachaches. He denies suicidal ideation or psychotic symptoms. He does not use drugs or alcohol. Elements:  Location:  Global. Quality:  Improving. Severity:  Moderate. Timing:  Daily. Duration:  Approximately 2 years. Context:  Stressful work environment. Associated Signs/Symptoms: Depression Symptoms:  depressed mood, anhedonia, psychomotor agitation,  fatigue, difficulty concentrating, anxiety, loss of energy/fatigue, disturbed sleep, (Hypo) Manic Symptoms:  Irritable Mood, Anxiety Symptoms:  Excessive Worry,   Past Medical History:  Past Medical History  Diagnosis Date  . Essential hypertension, benign   . Mixed hyperlipidemia 2006  . 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 Arthritis   . Fatty liver disease, nonalcoholic   .  Coronary atherosclerosis of native coronary artery     a. 12/09/2013: s/p PCI with 3.0 x 28 Promus DES to mLAD  . Osgood-Schlatter's disease of right knee   . Type 2 diabetes mellitus ([place])   . Obesity     Past Surgical History  Procedure Laterality Date  . Liver biopsy    . Knee arthroplasty Right 1999  . Colonoscopy  06/19/2012    Procedure: COLONOSCOPY;  Surgeon: [name], MD;  Location: [facility];  Service: Endoscopy;  Laterality: N/A;  930  . Cardiac catheterization  2011  . Cholecystectomy  2003  . Tonsillectomy  1970's  . Left heart catheterization with coronary angiogram N/A 12/09/2013    Procedure: LEFT HEART CATHETERIZATION WITH CORONARY ANGIOGRAM;  Surgeon: [name], MD;  Location: [facility] CATH LAB;  Service: Cardiovascular;  Laterality: N/A;  . Cardiac catheterization N/A 09/20/2015    Procedure: Left Heart Cath and Coronary Angiography;  Surgeon: [name], MD;  Location: [facility] CV LAB;  Service: Cardiovascular;  Laterality: N/A;   Family History:  Family History  Problem Relation Age of Onset  . Cancer Maternal Aunt   . CVA Maternal Grandmother   . CVA Maternal Grandfather    Social History:   Social History   Social History  . Marital Status: Married    Spouse Name: N/A  . Number of Children: N/A  . Years of Education: N/A   Social History Main Topics  . Smoking status: Former Smoker -- 0.50 packs/day for 10 years    Types: Cigarettes    Start date: 02/02/1973    Quit date: 09/17/1983  . Smokeless tobacco: Former Systems developer    Types: [place] date: 09/17/1983  . Alcohol Use: No     Comment: 11-30-15 per pt no  . Drug Use: No     Comment: 11-30-15 per pt no  . Sexual Activity: Yes   Other Topics Concern  . 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    Married with 1 child   Right handed    12 th    Very little caffeine   Additional Social  History: The patient grew up primarily in [place] with both parents. He states he has another sister but she is not involved in the family and he has had little contact with her. He denies any history of abuse or trauma. 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 He is married and has one 35 year old son. His wife has had severe gastric reflux and has had to go on disability as well  Musculoskeletal: Strength & Muscle Tone: within normal limits Gait & Station: normal Patient leans: N/A  Psychiatric Specialty Exam: HPI  ROS  Blood pressure 109/78, pulse 78, height 6' 2"  (1.88 m), weight 238 lb (107.956 kg), SpO2 93 %.Body mass index is 30.54 kg/(m^2).  General Appearance: Casual, Neat and Well Groomed  Eye Contact:  Good  Speech:  Clear and Coherent  Volume:  Normal  Mood:  Anxious  Affect:  Appropriate  Thought Process:  Goal Directed  Orientation:  Full (Time, Place, and Person)  Thought Content:  WDL  Suicidal Thoughts:  No  Homicidal Thoughts:  No  Memory:  Immediate;   Good Recent;   Good Remote;   Good  Judgement:  Good  Insight:  Good  Psychomotor Activity:  Normal  Concentration:  Good  Recall:  Good  Fund of Knowledge:Good  Language: Good  Akathisia:  No  Handed:  Right  AIMS (if indicated):    Assets:  Communication Skills Desire for Improvement Resilience Social Support Talents/Skills  ADL's:  Intact  Cognition: WNL  Sleep:  ok   Is the patient at risk to self?  No. Has the patient been a risk to self in the past 6 months?  No. Has the patient been a risk to self within the distant past?  No. Is the patient a risk to others?  No. Has the patient been a risk to others in the past 6 months?  No. Has the patient been a risk to others within the distant past?  No.  Allergies:   Allergies  Allergen Reactions  . Metoprolol Rash   Current Medications: Current Outpatient Prescriptions   Medication Sig Dispense Refill  . acetaminophen (TYLENOL) 500 MG tablet Take 1,000 mg by mouth daily as needed for headache. Pain.    Marland Kitchen ALPRAZolam (XANAX) 0.5 MG tablet Take 1 tablet three times a day as needed 90 tablet 2  . aspirin (ASPIR-LOW) 81 MG EC tablet Take 81 mg by mouth daily.     Marland Kitchen atorvastatin (LIPITOR) 40 MG tablet Take 1 tablet (40 mg total) by mouth daily. 30 tablet 6  . buPROPion (WELLBUTRIN SR) 150 MG 12 hr tablet TAKE ONE TABLET BY MOUTH FOR 3 DAYS; THEN TAKE ONE TABLET TWICE A DAY THERE AFTER 60 tablet 5  . diltiazem (CARDIZEM) 30 MG tablet TAKE ONE TABLET BY MOUTH TWICE A DAY 60 tablet 5  . enalapril (VASOTEC) 10 MG tablet TAKE ONE (1) TABLET AT BEDTIME 30 tablet 5  . gabapentin (NEURONTIN) 300 MG capsule Take 1 capsule (300 mg total) by mouth 4 (four) times daily. 120 capsule 5  . glipiZIDE (GLUCOTROL) 5 MG tablet Take one and a half tablets bid 90 tablet 5  . HYDROcodone-acetaminophen (NORCO) 7.5-325 MG per tablet Take 1 tablet by mouth every 4 (four) hours as needed for moderate pain. 30 tablet 0  . INVOKANA 300 MG TABS tablet TAKE ONE (1) TABLET EACH DAY BEFORE BREAKFAST 30 tablet 5  . meclizine (ANTIVERT) 25 MG tablet TAKE ONE (1) TABLET THREE (3) TIMES EACHDAY AS NEEDED FOR DIZZINESS OR NAUSEA 30 tablet 5  . metFORMIN (GLUCOPHAGE) 500 MG tablet TAKE TWO TABLET EVERY MORNING, THEN ONE TABLET AT NOON, THEN TWO TABLETS EVERY EVENING 450 tablet 0  . nitroGLYCERIN (NITROSTAT) 0.4 MG SL tablet Place 1 tablet (0.4 mg total) under the tongue every 5 (five) minutes as needed for chest pain. 25 tablet 3  . pantoprazole (PROTONIX) 40 MG tablet TAKE ONE (1) TABLET EACH DAY 30 tablet 5  . ranitidine (ZANTAC) 150 MG tablet Take 150 mg by mouth daily as needed for heartburn.    . temazepam (RESTORIL) 30 MG capsule Take 1 capsule (30 mg total) by mouth at bedtime as needed for sleep. 30 capsule 0  . sertraline (ZOLOFT) 50 MG tablet Take 1 tablet (50 mg total)  by mouth daily. 30 tablet  2   No current facility-administered medications for this visit.    Previous Psychotropic Medications: Yes   Substance Abuse History in the last 12 months:  No.  Consequences of Substance Abuse: NA  Medical Decision Making:  Review of Psycho-Social Stressors (1), Review or order clinical lab tests (1), Review and summation of old records (2), Review of Medication Regimen & Side Effects (2) and Review of New Medication or Change in Dosage (2)  Treatment Plan Summary: Medication management   This patient is a 56 year old white male who was increasingly stressed and anxious due to his work environment. Now that he is no longer in the work environment is starting to feel much better. He states that he is generally somewhat of an anxious person but the Wellbutrin has not helped. I've instructed him to taper it off and at the same time start an SSRI-Zoloft 50 mg daily. This may be better suited to help anxiety. For now he can continue Xanax 0.5 mg 3 times a day for anxiety as well. He declines counseling here and states that he is feeling better each day he is away from work. He'll return for a checkup in 4 weeks    Serrina Minogue, [facility] 3/15/20173:20 PM

## 2015-11-30 NOTE — Patient Instructions (Signed)
Start zoloft in the evening Take wellbutrin in the am for one week then stop

## 2015-12-09 ENCOUNTER — Encounter: Payer: Self-pay | Admitting: Family Medicine

## 2015-12-09 ENCOUNTER — Ambulatory Visit (INDEPENDENT_AMBULATORY_CARE_PROVIDER_SITE_OTHER): Payer: PRIVATE HEALTH INSURANCE | Admitting: Family Medicine

## 2015-12-09 VITALS — BP 132/84 | Temp 98.1°F | Ht 74.0 in | Wt 232.4 lb

## 2015-12-09 DIAGNOSIS — G542 Cervical root disorders, not elsewhere classified: Secondary | ICD-10-CM

## 2015-12-09 DIAGNOSIS — J019 Acute sinusitis, unspecified: Secondary | ICD-10-CM | POA: Diagnosis not present

## 2015-12-09 DIAGNOSIS — B9689 Other specified bacterial agents as the cause of diseases classified elsewhere: Secondary | ICD-10-CM

## 2015-12-09 MED ORDER — AMOXICILLIN-POT CLAVULANATE 875-125 MG PO TABS: 1.0000 | ORAL_TABLET | Freq: Two times a day (BID) | ORAL | Status: DC

## 2015-12-09 MED ORDER — PREDNISONE 20 MG PO TABS: ORAL_TABLET | ORAL | Status: DC

## 2015-12-09 NOTE — Progress Notes (Signed)
   Subjective:    Patient ID: Jared Griffin, male    DOB: 1961-01-22, 55 y.o.   MRN: [id number]  Sinus Problem This is a new problem. The current episode started in the past 7 days. Associated symptoms include congestion, ear pain and headaches. Treatments tried: otc meds.   Patient relates pain on the side of his neck that radiates from the occiput area down the neck into the upper trapezius describes as a sharp aching pain states he's been dealing with this for months the last specialist see salt told him he did not need surgery.   Review of Systems  HENT: Positive for congestion and ear pain.   Neurological: Positive for headaches.       Objective:   Physical Exam Physical exam the lungs are clear hearts regular sinus mild tenderness in the right side neck mild tenderness subjective       Assessment & Plan:  1. Acute bacterial rhinosinusitis Moderate sinus symptoms antibiotics prescribed warning signs discussed follow-up if problems  2. Cervical nerve root impingement Prednisone taper when patient is ready we will get second opinion from orthopedic spine specialist, Dr. Rolena Infante

## 2015-12-27 ENCOUNTER — Ambulatory Visit (INDEPENDENT_AMBULATORY_CARE_PROVIDER_SITE_OTHER): Payer: PRIVATE HEALTH INSURANCE | Admitting: Psychiatry

## 2015-12-27 ENCOUNTER — Encounter (HOSPITAL_COMMUNITY): Payer: Self-pay | Admitting: Psychiatry

## 2015-12-27 VITALS — BP 130/86 | HR 85 | Ht 74.0 in | Wt 232.0 lb

## 2015-12-27 DIAGNOSIS — F329 Major depressive disorder, single episode, unspecified: Secondary | ICD-10-CM | POA: Diagnosis not present

## 2015-12-27 DIAGNOSIS — F32A Depression, unspecified: Secondary | ICD-10-CM

## 2015-12-27 MED ORDER — ALPRAZOLAM 0.5 MG PO TABS: ORAL_TABLET | ORAL | Status: DC

## 2015-12-27 NOTE — Progress Notes (Signed)
Patient ID: Jared Griffin, male   DOB: January 07, 1961, 55 y.o.   MRN: [id number]  Psychiatric  Adult follow-up  Patient Identification: Meer Reindl MRN:  [id number] Date of Evaluation:  12/27/2015 Referral Source: Dr. [name] Chief Complaint:   Chief Complaint    Depression; Anxiety; Follow-up     Visit Diagnosis:    ICD-9-CM ICD-10-CM   1. Depression 311 F32.9    Diagnosis:   Patient Active Problem List   Diagnosis Date Noted  . Cervical nerve root impingement [G54.2] 12/09/2015  . Generalized anxiety disorder [F41.1] 09/27/2015  . Chest pain [R07.9] 09/20/2015  . Pain in the chest [R07.9]   . Depression [F32.9] 01/24/2015  . Esophageal reflux [K21.9] 01/24/2015  . Preoperative cardiovascular examination [Z01.810] 06/28/2014  . Type 2 diabetes mellitus ([place]) [E11.9]   . Mixed hyperlipidemia [E78.2]   . 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 Fatty liver disease, nonalcoholic [H47.4]   . Obesity [E66.9]   . Intermediate coronary syndrome ([place]) [I20.0] 12/09/2013  . Fatty liver [K76.0] 05/05/2013  . Lumbago [M54.5] 05/21/2012  . Essential hypertension, benign [I10] 12/28/2009  . CAD S/P LAD DES March 2015 [I25.10, Z98.61] 12/28/2009  . Hyperlipidemia LDL goal <70 [E78.5] 11/25/2009  . Accelerating angina ([place]) [I20.0] 11/25/2009  . DM [E11.9] 11/24/2009  . ABDOMINAL PAIN, HX OF [Z91.89] 11/24/2009   History of Present Illness:  This patient is a 55 year old married white male lives with his wife and 53 year old son in [place]. 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  The patient was referred by his primary physician, Dr. [name], for further assessment and treatment of anxiety and depression.  The patient states that he is become increasingly depressed and anxious over the last couple of years due to work stress. He stated that he was always under pressure to get more  things done than he had time to do. 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 He got to the point of hating going to work. Several years ago he had cardiac problems and had a stent placed. In January of this year he began developing dizzy spells chest pain headaches stomach aches. It got so bad that he was readmitted for cardiac catheterization but nothing new was found.  The patient was having difficulty sleeping constant worry and anxiety about his job sadness and anger. He was getting irritable with people at home. After talking to his primary care physician at length and decided to take him out on medical retirement. The patient has been on Wellbutrin SR 150 no gram twice a day for several weeks now. He had also been out in the past when his wife was sick. He has not seen any benefit from this but does feel tremendously better since he stopped working. He is also on Xanax 0.5 mg 3 times a day which he thinks has helped to some degree.  Since stopping working he's had a big turnaround. His sleep and energy have improved. 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 He is much less anxious and is no longer having the chest pain dizzy spells or stomachaches. He denies suicidal ideation or psychotic symptoms. He does not use drugs or alcohol  The patient returns after 4 weeks. He is generally doing well.  He is now on Zoloft 50 mg daily instead of Wellbutrin. His anxiety is lessened but he still uses Xanax once or twice a day. Overall however he feels much less stress compared to when he was working. 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 His wife has had a lot of back pain and he has been helping her quite a bit. He denies suicidal ideation. He is sleeping well. Elements:  Location:  Global. Quality:  Improving. Severity:   Moderate. Timing:  Daily. Duration:  Approximately 2 years. Context:  Stressful work environment. Associated Signs/Symptoms: Depression Symptoms:  depressed mood, anhedonia, psychomotor agitation, fatigue, difficulty concentrating, anxiety, loss of energy/fatigue, disturbed sleep, (Hypo) Manic Symptoms:  Irritable Mood, Anxiety Symptoms:  Excessive Worry,   Past Medical History:  Past Medical History  Diagnosis Date  . Essential hypertension, benign   . Mixed hyperlipidemia 2006  . 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 Arthritis   . Fatty liver disease, nonalcoholic   . Coronary atherosclerosis of native coronary artery     a. 12/09/2013: s/p PCI with 3.0 x 28 Promus DES to mLAD  . Osgood-Schlatter's disease of right knee   . Type 2 diabetes mellitus ([place])   . Obesity     Past Surgical History  Procedure Laterality Date  . Liver biopsy    . Knee arthroplasty Right 1999  . Colonoscopy  06/19/2012    Procedure: COLONOSCOPY;  Surgeon: [name], MD;  Location: [facility];  Service: Endoscopy;  Laterality: N/A;  930  . Cardiac catheterization  2011  . Cholecystectomy  2003  . Tonsillectomy  1970's  . Left heart catheterization with coronary angiogram N/A 12/09/2013    Procedure: LEFT HEART CATHETERIZATION WITH CORONARY ANGIOGRAM;  Surgeon: [name], MD;  Location: [facility] CATH LAB;  Service: Cardiovascular;  Laterality: N/A;  . Cardiac catheterization N/A 09/20/2015    Procedure: Left Heart Cath and Coronary Angiography;  Surgeon: [name], MD;  Location: [facility] CV LAB;  Service: Cardiovascular;  Laterality: N/A;   Family History:  Family History  Problem Relation Age of Onset  . Cancer Maternal Aunt   . CVA Maternal Grandmother   . CVA Maternal Grandfather    Social History:   Social History   Social History  . Marital Status: Married    Spouse Name: N/A  . Number of Children: N/A  . Years of Education: N/A    Social History Main Topics  . Smoking status: Former Smoker -- 0.50 packs/day for 10 years    Types: Cigarettes    Start date: 02/02/1973    Quit date: 09/17/1983  . Smokeless tobacco: Former Systems developer    Types: [place] date: 09/17/1983  . Alcohol Use: No     Comment: 11-30-15 per pt no  . Drug Use: No     Comment: 11-30-15 per pt no  . Sexual Activity: Yes   Other Topics Concern  . 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    Married with 1 child   Right handed    12 th    Very little caffeine   Additional Social History: The patient grew up primarily in [place] with both parents. He states he has another sister but she is not involved in the family and he has had little  contact with her. He denies any history of abuse or trauma. 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 He is married and has one 4 year old son. His wife has had severe gastric reflux and has had to go on disability as well  Musculoskeletal: Strength & Muscle Tone: within normal limits Gait & Station: normal Patient leans: N/A  Psychiatric Specialty Exam: Depression        Past medical history includes anxiety.   Anxiety      Review of Systems  Psychiatric/Behavioral: Positive for depression.    Blood pressure 130/86, pulse 85, height 6' 2"  (1.88 m), weight 232 lb (105.235 kg), SpO2 97 %.Body mass index is 29.77 kg/(m^2).  General Appearance: Casual, Neat and Well Groomed  Eye Contact:  Good  Speech:  Clear and Coherent  Volume:  Normal  Mood:  Good   Affect:  Bright   Thought Process:  Goal Directed  Orientation:  Full (Time, Place, and Person)  Thought Content:  WDL  Suicidal Thoughts:  No  Homicidal Thoughts:  No  Memory:  Immediate;   Good Recent;   Good Remote;   Good  Judgement:  Good  Insight:  Good  Psychomotor Activity:  Normal   Concentration:  Good  Recall:  Good  Fund of Knowledge:Good  Language: Good  Akathisia:  No  Handed:  Right  AIMS (if indicated):    Assets:  Communication Skills Desire for Improvement Resilience Social Support Talents/Skills  ADL's:  Intact  Cognition: WNL  Sleep:  ok   Is the patient at risk to self?  No. Has the patient been a risk to self in the past 6 months?  No. Has the patient been a risk to self within the distant past?  No. Is the patient a risk to others?  No. Has the patient been a risk to others in the past 6 months?  No. Has the patient been a risk to others within the distant past?  No.  Allergies:   Allergies  Allergen Reactions  . Metoprolol Rash   Current Medications: Current Outpatient Prescriptions  Medication Sig Dispense Refill  . acetaminophen (TYLENOL) 500 MG tablet Take 1,000 mg by mouth daily as needed for headache. Pain.    Marland Kitchen ALPRAZolam (XANAX) 0.5 MG tablet Take 1 tablet three times a day as needed 90 tablet 2  . aspirin (ASPIR-LOW) 81 MG EC tablet Take 81 mg by mouth daily.     Marland Kitchen atorvastatin (LIPITOR) 40 MG tablet Take 1 tablet (40 mg total) by mouth daily. 30 tablet 6  . diltiazem (CARDIZEM) 30 MG tablet TAKE ONE TABLET BY MOUTH TWICE A DAY 60 tablet 5  . enalapril (VASOTEC) 10 MG tablet TAKE ONE (1) TABLET AT BEDTIME 30 tablet 5  . gabapentin (NEURONTIN) 300 MG capsule Take 1 capsule (300 mg total) by mouth 4 (four) times daily. 120 capsule 5  . glipiZIDE (GLUCOTROL) 5 MG tablet Take one and a half tablets bid 90 tablet 5  . HYDROcodone-acetaminophen (NORCO) 7.5-325 MG per tablet Take 1 tablet by mouth every 4 (four) hours as needed for moderate pain. 30 tablet 0  . INVOKANA 300 MG TABS tablet TAKE ONE (1) TABLET EACH DAY BEFORE BREAKFAST 30 tablet 5  . metFORMIN (GLUCOPHAGE) 500 MG tablet TAKE TWO TABLET EVERY MORNING, THEN ONE TABLET AT NOON, THEN TWO TABLETS EVERY EVENING 450 tablet 0  . nitroGLYCERIN (NITROSTAT) 0.4 MG SL tablet  Place 1  tablet (0.4 mg total) under the tongue every 5 (five) minutes as needed for chest pain. 25 tablet 3  . pantoprazole (PROTONIX) 40 MG tablet TAKE ONE (1) TABLET EACH DAY 30 tablet 5  . ranitidine (ZANTAC) 150 MG tablet Take 150 mg by mouth daily as needed for heartburn.    . sertraline (ZOLOFT) 50 MG tablet Take 1 tablet (50 mg total) by mouth daily. 30 tablet 2  . temazepam (RESTORIL) 30 MG capsule Take 1 capsule (30 mg total) by mouth at bedtime as needed for sleep. 30 capsule 0   No current facility-administered medications for this visit.    Previous Psychotropic Medications: Yes   Substance Abuse History in the last 12 months:  No.  Consequences of Substance Abuse: NA  Medical Decision Making:  Review of Psycho-Social Stressors (1), Review or order clinical lab tests (1), Review and summation of old records (2), Review of Medication Regimen & Side Effects (2) and Review of New Medication or Change in Dosage (2)  Treatment Plan Summary: Medication management   This patient is a 55 year old white male who was increasingly stressed and anxious due to his work environment. Now that he is no longer in the work environment is starting to feel much better. He states that he is generally somewhat of an anxious person but the Wellbutrin has not helped. I've instructed him to taper it off and at the same time start an SSRI-Zoloft 50 mg daily. This may be better suited to help anxiety. For now he can continue Xanax 0.5 mg 3 times a day for anxiety as well. He declines counseling here and states that he is feeling better each day he is away from work. He'll return for a checkup in 4 weeks    ROSS, [facility] 4/11/20172:39 PM

## 2016-01-13 ENCOUNTER — Other Ambulatory Visit: Payer: Self-pay | Admitting: Family Medicine

## 2016-01-30 ENCOUNTER — Encounter: Payer: Self-pay | Admitting: Family Medicine

## 2016-01-30 ENCOUNTER — Ambulatory Visit (INDEPENDENT_AMBULATORY_CARE_PROVIDER_SITE_OTHER): Payer: PRIVATE HEALTH INSURANCE | Admitting: Family Medicine

## 2016-01-30 VITALS — BP 110/80 | Ht 74.0 in | Wt 232.2 lb

## 2016-01-30 DIAGNOSIS — Z125 Encounter for screening for malignant neoplasm of prostate: Secondary | ICD-10-CM

## 2016-01-30 DIAGNOSIS — N5201 Erectile dysfunction due to arterial insufficiency: Secondary | ICD-10-CM

## 2016-01-30 DIAGNOSIS — I10 Essential (primary) hypertension: Secondary | ICD-10-CM | POA: Diagnosis not present

## 2016-01-30 DIAGNOSIS — E785 Hyperlipidemia, unspecified: Secondary | ICD-10-CM | POA: Diagnosis not present

## 2016-01-30 DIAGNOSIS — E119 Type 2 diabetes mellitus without complications: Secondary | ICD-10-CM | POA: Diagnosis not present

## 2016-01-30 DIAGNOSIS — Z79899 Other long term (current) drug therapy: Secondary | ICD-10-CM | POA: Diagnosis not present

## 2016-01-30 LAB — POCT GLYCOSYLATED HEMOGLOBIN (HGB A1C): Hemoglobin A1C: 6.8

## 2016-01-30 MED ORDER — ENALAPRIL MALEATE 10 MG PO TABS: ORAL_TABLET | ORAL | Status: DC

## 2016-01-30 MED ORDER — DILTIAZEM HCL 30 MG PO TABS: 30.0000 mg | ORAL_TABLET | Freq: Two times a day (BID) | ORAL | Status: DC

## 2016-01-30 MED ORDER — GABAPENTIN 300 MG PO CAPS: 300.0000 mg | ORAL_CAPSULE | Freq: Four times a day (QID) | ORAL | Status: DC

## 2016-01-30 MED ORDER — TADALAFIL 20 MG PO TABS: 10.0000 mg | ORAL_TABLET | ORAL | Status: DC | PRN

## 2016-01-30 MED ORDER — METFORMIN HCL 500 MG PO TABS: ORAL_TABLET | ORAL | Status: DC

## 2016-01-30 MED ORDER — GLIPIZIDE 5 MG PO TABS: ORAL_TABLET | ORAL | Status: DC

## 2016-01-30 MED ORDER — PANTOPRAZOLE SODIUM 40 MG PO TBEC: DELAYED_RELEASE_TABLET | ORAL | Status: DC

## 2016-01-30 MED ORDER — CANAGLIFLOZIN 300 MG PO TABS: ORAL_TABLET | ORAL | Status: DC

## 2016-01-30 NOTE — Progress Notes (Signed)
   Subjective:    Patient ID: Jared Griffin, male    DOB: [birth year], 55 y.o.   MRN: [id number] Patient arrives office for follow-up of numerous concerns Diabetes He presents for his follow-up diabetic visit. He has type 2 diabetes mellitus. There are no hypoglycemic associated symptoms. There are no diabetic associated symptoms. There are no hypoglycemic complications. There are no diabetic complications. There are no known risk factors for coronary artery disease. Current diabetic treatment includes oral agent (triple therapy). He is compliant with treatment all of the time.   Patient states that he has no concerns at this time.   Results for orders placed or performed in visit on 01/30/16  POCT glycosylated hemoglobin (Hb A1C)  Result Value Ref Range   Hemoglobin A1C 6.8     Working on diet and exercising  Walking parking lot   Notes morn numbers are generally 140 to 130   Feet overall sig better, on neurontin, helping the discomfort  Due to have eye exam soon    Compliant with lipid medication. Prior lipid numbers reviewed. Does reviewed. No obvious side effects from it X  Notes ongoing challenges with erectile dysfunction. Tried Viagra in the past would like to try something else.   Compliant with blood pressure medicine. No obvious side effects. Watching salt in diet.    Review of Systems No headache, no major weight loss or weight gain, no chest pain no back pain abdominal pain no change in bowel habits complete ROS otherwise negative     Objective:   Physical Exam  Alert vitals stable blood pressure excellent on repeat HEENT normal lungs clear heart rare rhythm ankles no significant edema feet pulses good sensation intact    Assessment & Plan:  Impression 1 type 2 diabetes much improved continue same dose discussed #2 hypertension meds reviewed to continue same dose #3 hyperlipidemia status and certain meds reviewed maintain the check level for neuropathy stable  and improved on Neurontin No. 5 erectile dysfunction discuss meds plan as noted above plus blood work diet exercise discuss follow-up in 3 months but work to do one week before visit [facility]

## 2016-02-24 ENCOUNTER — Ambulatory Visit (INDEPENDENT_AMBULATORY_CARE_PROVIDER_SITE_OTHER): Payer: PRIVATE HEALTH INSURANCE | Admitting: Psychiatry

## 2016-02-24 ENCOUNTER — Encounter (HOSPITAL_COMMUNITY): Payer: Self-pay | Admitting: Psychiatry

## 2016-02-24 VITALS — BP 109/76 | HR 83 | Ht 74.0 in | Wt 230.0 lb

## 2016-02-24 DIAGNOSIS — F329 Major depressive disorder, single episode, unspecified: Secondary | ICD-10-CM | POA: Diagnosis not present

## 2016-02-24 DIAGNOSIS — F32A Depression, unspecified: Secondary | ICD-10-CM

## 2016-02-24 MED ORDER — ALPRAZOLAM 0.5 MG PO TABS: ORAL_TABLET | ORAL | Status: DC

## 2016-02-24 MED ORDER — SERTRALINE HCL 100 MG PO TABS: 100.0000 mg | ORAL_TABLET | Freq: Every day | ORAL | Status: DC

## 2016-02-24 NOTE — Progress Notes (Signed)
Patient ID: Jared Griffin, male   DOB: [birth year], 55 y.o.   MRN: [id number] Patient ID: Jared Griffin, male   DOB: Nov 22, 1960, 55 y.o.   MRN: [id number]  Psychiatric  Adult follow-up  Patient Identification: [name] MRN:  [id number] Date of Evaluation:  02/24/2016 Referral Source: Dr. [name] Chief Complaint:   Chief Complaint    Depression; Anxiety; Follow-up     Visit Diagnosis:    ICD-9-CM ICD-10-CM   1. Depression 311 F32.9    Diagnosis:   Patient Active Problem List   Diagnosis Date Noted  . Cervical nerve root impingement [G54.2] 12/09/2015  . Generalized anxiety disorder [F41.1] 09/27/2015  . Chest pain [R07.9] 09/20/2015  . Pain in the chest [R07.9]   . Depression [F32.9] 01/24/2015  . Esophageal reflux [K21.9] 01/24/2015  . Preoperative cardiovascular examination [Z01.810] 06/28/2014  . Type 2 diabetes mellitus ([place]) [E11.9]   . Mixed hyperlipidemia [E78.2]   . 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 Fatty liver disease, nonalcoholic [Y86.5]   . Obesity [E66.9]   . Intermediate coronary syndrome ([facility]) [I20.0] 12/09/2013  . Fatty liver [K76.0] 05/05/2013  . Lumbago [M54.5] 05/21/2012  . Essential hypertension, benign [I10] 12/28/2009  . CAD S/P LAD DES March 2015 [I25.10, Z98.61] 12/28/2009  . Hyperlipidemia LDL goal <70 [E78.5] 11/25/2009  . Accelerating angina ([place]) [I20.0] 11/25/2009  . DM [E11.9] 11/24/2009  . ABDOMINAL PAIN, HX OF [Z91.89] 11/24/2009   History of Present Illness:  This patient is a 55 year old married white male lives with his wife and 57 year old son in [place]. 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  The patient was referred by his primary physician, Dr. [name], for further assessment and treatment of anxiety and depression.  The patient states that he is become increasingly depressed and anxious over the last couple of years due  to work stress. He stated that he was always under pressure to get more things done than he had time to do. 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 He got to the point of hating going to work. Several years ago he had cardiac problems and had a stent placed. In January of this year he began developing dizzy spells chest pain headaches stomach aches. It got so bad that he was readmitted for cardiac catheterization but nothing new was found.  The patient was having difficulty sleeping constant worry and anxiety about his job sadness and anger. He was getting irritable with people at home. After talking to his primary care physician at length and decided to take him out on medical retirement. The patient has been on Wellbutrin SR 150 no gram twice a day for several weeks now. He had also been out in the past when his wife was sick. He has not seen any benefit from this but does feel tremendously better since he stopped working. He is also on Xanax 0.5 mg 3 times a day which he thinks has helped to some degree.  Since stopping working he's had a big turnaround. His sleep and energy have improved. 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 He is much less anxious and is no longer having the chest pain dizzy spells or stomachaches. He denies suicidal ideation or psychotic symptoms. He does not use  drugs or alcohol  The patient returns after 4 weeks. He is doing okay but still feels anxious and on edge. He is more anxious than depressed at this point. His wife is on disability and he is not earning anything more than what he gets through retirement in his health insurance costs are very high. He's looked for some part-time work but nothing has worked out. The Xanax still helps his anxiety but I suggested we increase the Zoloft and he agrees. He denies suicidal ideation Elements:  Location:   Global. Quality:  Improving. Severity:  Moderate. Timing:  Daily. Duration:  Approximately 2 years. Context:  Stressful work environment. Associated Signs/Symptoms: Depression Symptoms:  depressed mood, anhedonia, psychomotor agitation, fatigue, difficulty concentrating, anxiety, loss of energy/fatigue, disturbed sleep, (Hypo) Manic Symptoms:  Irritable Mood, Anxiety Symptoms:  Excessive Worry,   Past Medical History:  Past Medical History  Diagnosis Date  . Essential hypertension, benign   . Mixed hyperlipidemia 2006  . 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 Arthritis   . Fatty liver disease, nonalcoholic   . Coronary atherosclerosis of native coronary artery     a. 12/09/2013: s/p PCI with 3.0 x 28 Promus DES to mLAD  . Osgood-Schlatter's disease of right knee   . Type 2 diabetes mellitus ([place])   . Obesity     Past Surgical History  Procedure Laterality Date  . Liver biopsy    . Knee arthroplasty Right 1999  . Colonoscopy  06/19/2012    Procedure: COLONOSCOPY;  Surgeon: [name], MD;  [facility];  Service: Endoscopy;  Laterality: N/A;  930  . Cardiac catheterization  2011  . Cholecystectomy  2003  . Tonsillectomy  1970's  . Left heart catheterization with coronary angiogram N/A 12/09/2013    Procedure: LEFT HEART CATHETERIZATION WITH CORONARY ANGIOGRAM;  Surgeon: [name], MD;  Location: [facility] CATH LAB;  Service: Cardiovascular;  Laterality: N/A;  . Cardiac catheterization N/A 09/20/2015    Procedure: Left Heart Cath and Coronary Angiography;  Surgeon: [name], MD;  Location: [facility];  Service: Cardiovascular;  Laterality: N/A;   Family History:  Family History  Problem Relation Age of Onset  . Cancer Maternal Aunt   . CVA Maternal Grandmother   . CVA Maternal Grandfather    Social History:   Social History   Social History  . Marital Status: Married    Spouse Name: N/A  . Number of Children:  N/A  . Years of Education: N/A   Social History Main Topics  . Smoking status: Former Smoker -- 0.50 packs/day for 10 years    Types: Cigarettes    Start date: 02/02/1973    Quit date: 09/17/1983  . Smokeless tobacco: Former Systems developer    Types: [place] date: 09/17/1983  . Alcohol Use: No     Comment: 11-30-15 per pt no  . Drug Use: No     Comment: 11-30-15 per pt no  . Sexual Activity: Yes   Other Topics Concern  . 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    Married with 1 child   Right handed    12 th    Very little caffeine   Additional Social History: The patient grew up primarily in [place] with both parents. He states he has another sister but she is not involved in the family  and he has had little contact with her. He denies any history of abuse or trauma. 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 He is married and has one 62 year old son. His wife has had severe gastric reflux and has had to go on disability as well  Musculoskeletal: Strength & Muscle Tone: within normal limits Gait & Station: normal Patient leans: N/A  Psychiatric Specialty Exam: Depression        Past medical history includes anxiety.   Anxiety      Review of Systems  Psychiatric/Behavioral: Positive for depression.    Blood pressure 109/76, pulse 83, height 6' 2"  (1.88 m), weight 230 lb (104.327 kg).Body mass index is 29.52 kg/(m^2).  General Appearance: Casual, Neat and Well Groomed  Eye Contact:  Good  Speech:  Clear and Coherent  Volume:  Normal  Mood:  Anxious   Affect: Congruent     Orientation:  Full (Time, Place, and Person)  Thought Content:  WDL  Suicidal Thoughts:  No  Homicidal Thoughts:  No  Memory:  Immediate;   Good Recent;   Good Remote;   Good  Judgement:  Good  Insight:  Good  Psychomotor Activity:  Normal   Concentration:  Good  Recall:  Good  Fund of Knowledge:Good  Language: Good  Akathisia:  No  Handed:  Right  AIMS (if indicated):    Assets:  Communication Skills Desire for Improvement Resilience Social Support Talents/Skills  ADL's:  Intact  Cognition: WNL  Sleep:  ok   Is the patient at risk to self?  No. Has the patient been a risk to self in the past 6 months?  No. Has the patient been a risk to self within the distant past?  No. Is the patient a risk to others?  No. Has the patient been a risk to others in the past 6 months?  No. Has the patient been a risk to others within the distant past?  No.  Allergies:   Allergies  Allergen Reactions  . Metoprolol Rash   Current Medications: Current Outpatient Prescriptions  Medication Sig Dispense Refill  . acetaminophen (TYLENOL) 500 MG tablet Take 1,000 mg by mouth daily as needed for headache. Pain.    Marland Kitchen ALPRAZolam (XANAX) 0.5 MG tablet Take 1 tablet three times a day as needed 90 tablet 2  . aspirin (ASPIR-LOW) 81 MG EC tablet Take 81 mg by mouth daily.     Marland Kitchen atorvastatin (LIPITOR) 40 MG tablet Take 1 tablet (40 mg total) by mouth daily. 30 tablet 6  . canagliflozin (INVOKANA) 300 MG TABS tablet TAKE ONE (1) TABLET EACH DAY BEFORE BREAKFAST 30 tablet 5  . diltiazem (CARDIZEM) 30 MG tablet Take 1 tablet (30 mg total) by mouth 2 (two) times daily. 60 tablet 5  . enalapril (VASOTEC) 10 MG tablet TAKE ONE (1) TABLET AT BEDTIME 30 tablet 5  . gabapentin (NEURONTIN) 300 MG capsule Take 1 capsule (300 mg total) by mouth 4 (four) times daily. 120 capsule 5  . glipiZIDE (GLUCOTROL) 5 MG tablet Take one and a half tablets bid 90 tablet 5  . HYDROcodone-acetaminophen (NORCO) 7.5-325 MG per tablet Take 1 tablet by mouth every 4 (four) hours as needed for moderate pain. 30 tablet 0  . metFORMIN (GLUCOPHAGE) 500 MG tablet TAKE TWO TABLET EVERY MORNING, THEN ONE TABLET AT NOON, THEN TWO TABLETS EVERY EVENING 450 tablet 1  . nitroGLYCERIN  (NITROSTAT) 0.4  MG SL tablet Place 1 tablet (0.4 mg total) under the tongue every 5 (five) minutes as needed for chest pain. 25 tablet 3  . pantoprazole (PROTONIX) 40 MG tablet TAKE ONE (1) TABLET EACH DAY 30 tablet 5  . ranitidine (ZANTAC) 150 MG tablet Take 150 mg by mouth daily as needed for heartburn.    . tadalafil (CIALIS) 20 MG tablet Take 0.5-1 tablets (10-20 mg total) by mouth every other day as needed for erectile dysfunction. 5 tablet 11  . sertraline (ZOLOFT) 100 MG tablet Take 1 tablet (100 mg total) by mouth daily. 30 tablet 2   No current facility-administered medications for this visit.    Previous Psychotropic Medications: Yes   Substance Abuse History in the last 12 months:  No.  Consequences of Substance Abuse: NA  Medical Decision Making:  Review of Psycho-Social Stressors (1), Review or order clinical lab tests (1), Review and summation of old records (2), Review of Medication Regimen & Side Effects (2) and Review of New Medication or Change in Dosage (2)  Treatment Plan Summary: Medication management  The patient will increase Zoloft to 100 mg daily for depression and anxiety. He'll continue Xanax 0.5 mg up to 3 times a day for anxiety as well. He was offered counseling here but declined. He'll return to see me in 2 months    [place], [facility] 6/9/201711:01 AM

## 2016-02-27 ENCOUNTER — Other Ambulatory Visit: Payer: Self-pay | Admitting: Cardiovascular Disease

## 2016-03-26 ENCOUNTER — Other Ambulatory Visit: Payer: Self-pay | Admitting: *Deleted

## 2016-03-26 MED ORDER — NITROGLYCERIN 0.4 MG SL SUBL: 0.4000 mg | SUBLINGUAL_TABLET | SUBLINGUAL | Status: DC | PRN

## 2016-04-04 ENCOUNTER — Ambulatory Visit (HOSPITAL_COMMUNITY)
Admission: RE | Admit: 2016-04-04 | Discharge: 2016-04-04 | Disposition: A | Discharge status: Home / Self Care | Payer: PRIVATE HEALTH INSURANCE | Source: Ambulatory Visit | Attending: Family Medicine | Admitting: Family Medicine

## 2016-04-04 ENCOUNTER — Encounter: Payer: Self-pay | Admitting: Family Medicine

## 2016-04-04 ENCOUNTER — Ambulatory Visit (INDEPENDENT_AMBULATORY_CARE_PROVIDER_SITE_OTHER): Payer: PRIVATE HEALTH INSURANCE | Admitting: Family Medicine

## 2016-04-04 ENCOUNTER — Other Ambulatory Visit (HOSPITAL_COMMUNITY)
Admission: RE | Admit: 2016-04-04 | Discharge: 2016-04-04 | Disposition: A | Discharge status: Home / Self Care | Payer: PRIVATE HEALTH INSURANCE | Source: Ambulatory Visit | Attending: Family Medicine | Admitting: Family Medicine

## 2016-04-04 VITALS — BP 116/82 | Temp 98.3°F | Ht 74.0 in | Wt 231.4 lb

## 2016-04-04 DIAGNOSIS — R0609 Other forms of dyspnea: Secondary | ICD-10-CM | POA: Insufficient documentation

## 2016-04-04 DIAGNOSIS — R0600 Dyspnea, unspecified: Secondary | ICD-10-CM

## 2016-04-04 LAB — TROPONIN I: Troponin I: <0.03 ng/mL (ref <0.03)

## 2016-04-04 LAB — D-DIMER, QUANTITATIVE (NOT AT ARMC): D-Dimer, Quant: <0.27 ug/mL-FEU (ref 0.00–0.50)

## 2016-04-04 MED ORDER — HYDROCODONE-ACETAMINOPHEN 7.5-325 MG PO TABS: 1.0000 | ORAL_TABLET | ORAL | Status: DC | PRN

## 2016-04-04 NOTE — Progress Notes (Signed)
   Subjective:    Patient ID: Jared Griffin, male    DOB: 07/06/1961, 55 y.o.   MRN: [id number]  HPI Patient arrives with c/o SOB and dizziness with exertion-also stuffy for few- Patient has cardiologist and is making an appt with them also concerning issue. Patient relates that he is noticed over the past few months whenever he tries to really push himself such as trying to run or when he was with his son at the beach playing in the waves he finds himself getting significantly out of breath and having a hard time catching his breath he states he denies any chest pressure tightness. He did have a catheterization and echo back in January which did show some coronary artery disease he does have significant diabetic issues Patient with history of heart disease angina he also has history of type 2 diabetes fatty liver and hyperlipidemia. He relates compliance with his medicines does not smoke. Review of Systems Patient denies sweats chills fevers hemoptysis. Denies wheezing. Denies any chest pressure currently.    Objective:   Physical Exam Lungs are clear hearts regular pulse normal peak good extremities no edema skin warm dry neurologic grossly normal  EKG shows some inferior changes that were not present on previous EKG a proximally 6 months ago      Assessment & Plan:  Chest x-ray looked unremarkable D-dimer was negative which indicates very low likelihood of pulmonary embolism Troponin negative. No sign of any recent myocardial disease Significant change with exertional shortness of breath given his significant diabetes this could be underlying heart disease. I have placed a call to the cardiologist awaiting phone call back. In addition to this we will do pulmonary function test to help rule out COPD 25 minutes was spent with the patient. Greater than half the time was spent in discussion and answering questions and counseling regarding the issues that the patient came in for today. The  patient is aware if he starts having severe chest tightness pressure pain diaphoresis or similar to immediately go to ER/911

## 2016-04-05 ENCOUNTER — Encounter: Payer: Self-pay | Admitting: Family Medicine

## 2016-04-11 ENCOUNTER — Ambulatory Visit: Payer: PRIVATE HEALTH INSURANCE | Admitting: Nurse Practitioner

## 2016-04-13 ENCOUNTER — Ambulatory Visit (HOSPITAL_COMMUNITY)
Admission: RE | Admit: 2016-04-13 | Discharge: 2016-04-13 | Disposition: A | Discharge status: Home / Self Care | Payer: PRIVATE HEALTH INSURANCE | Source: Ambulatory Visit | Attending: Family Medicine | Admitting: Family Medicine

## 2016-04-13 DIAGNOSIS — R0609 Other forms of dyspnea: Secondary | ICD-10-CM | POA: Insufficient documentation

## 2016-04-13 LAB — PULMONARY FUNCTION TEST
FEF 25-75 Post: 5.1 L/sec
FEF 25-75 Pre: 4.87 L/sec
FEF2575-%Change-Post: 4 %
FEF2575-%Pred-Post: 139 %
FEF2575-%Pred-Pre: 133 %
FEV1-%Change-Post: 0 %
FEV1-%Pred-Post: 100 %
FEV1-%Pred-Pre: 100 %
FEV1-Post: 4.36 L
FEV1-Pre: 4.33 L
FEV1FVC-%Change-Post: 4 %
FEV1FVC-%Pred-Pre: 108 %
FEV6-%Change-Post: -3 %
FEV6-%Pred-Post: 92 %
FEV6-%Pred-Pre: 95 %
FEV6-Post: 5.01 L
FEV6-Pre: 5.19 L
FEV6FVC-%Pred-Post: 104 %
FEV6FVC-%Pred-Pre: 104 %
FVC-%Change-Post: -3 %
FVC-%Pred-Post: 88 %
FVC-%Pred-Pre: 91 %
FVC-Post: 5.01 L
FVC-Pre: 5.19 L
Post FEV1/FVC ratio: 87 %
Post FEV6/FVC ratio: 100 %
Pre FEV1/FVC ratio: 84 %
Pre FEV6/FVC Ratio: 100 %

## 2016-04-13 MED ORDER — ALBUTEROL SULFATE (2.5 MG/3ML) 0.083% IN NEBU
2.5000 mg | INHALATION_SOLUTION | Freq: Once | RESPIRATORY_TRACT | Status: AC
  Administered 2016-04-13: 2.5 mg via RESPIRATORY_TRACT

## 2016-04-23 ENCOUNTER — Encounter: Payer: Self-pay | Admitting: Cardiovascular Disease

## 2016-04-23 ENCOUNTER — Ambulatory Visit (INDEPENDENT_AMBULATORY_CARE_PROVIDER_SITE_OTHER): Payer: PRIVATE HEALTH INSURANCE | Admitting: Cardiovascular Disease

## 2016-04-23 VITALS — BP 106/72 | HR 81 | Ht 74.0 in | Wt 237.0 lb

## 2016-04-23 DIAGNOSIS — E782 Mixed hyperlipidemia: Secondary | ICD-10-CM

## 2016-04-23 DIAGNOSIS — I25118 Atherosclerotic heart disease of native coronary artery with other forms of angina pectoris: Secondary | ICD-10-CM

## 2016-04-23 DIAGNOSIS — R0600 Dyspnea, unspecified: Secondary | ICD-10-CM

## 2016-04-23 DIAGNOSIS — R42 Dizziness and giddiness: Secondary | ICD-10-CM | POA: Diagnosis not present

## 2016-04-23 DIAGNOSIS — I10 Essential (primary) hypertension: Secondary | ICD-10-CM

## 2016-04-23 DIAGNOSIS — R079 Chest pain, unspecified: Secondary | ICD-10-CM

## 2016-04-23 DIAGNOSIS — R0609 Other forms of dyspnea: Secondary | ICD-10-CM | POA: Diagnosis not present

## 2016-04-23 MED ORDER — RANOLAZINE ER 500 MG PO TB12: 500.0000 mg | ORAL_TABLET | Freq: Two times a day (BID) | ORAL | 6 refills | Status: DC

## 2016-04-23 MED ORDER — ENALAPRIL MALEATE 5 MG PO TABS: 5.0000 mg | ORAL_TABLET | Freq: Every day | ORAL | 6 refills | Status: DC

## 2016-04-23 NOTE — Progress Notes (Signed)
SUBJECTIVE: The patient presents for earlier than scheduled follow-up. He has a h/o CAD with LAD PCI in 11/2013, hypertension, hyperlipidemia, diabetes, and GERD.   He saw his PCP on 7/19 and was complaining of shortness of breath and dizziness.  Chest x-ray was unremarkable and d-dimer was negative. Troponin was also negative. Pulmonary function testing was also normal. It was felt that his exertional dyspnea was related to cardiac disease.  Due to persistent chest pain, he underwent coronary angiography on 09/19/14 which showed a patent stent in the mid LAD with mild to moderate mid LAD stenosis of 40% beyond the stent but which was not flow limiting. The circumflex and RCA were normal.  It was felt that his chest pain was noncardiac in etiology and was started on Protonix for possible GERD.  Echocardiography demonstrated normal left ventricle systolic function, EF 49-20%, with normal regional wall motion and normal diastolic function and mild mitral regurgitation.   Can walk 4-5 miles w/o DOE. Experiences it when bending over and doing yardwork. Has mild chest pressure and dizziness as well.   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 Married to [place], also my patient.  Review of Systems: As per "subjective", otherwise negative.  Allergies  Allergen Reactions  . Metoprolol Rash    Current Outpatient Prescriptions  Medication Sig Dispense Refill  . acetaminophen (TYLENOL) 500 MG tablet Take 1,000 mg by mouth daily as needed for headache. Pain.    Marland Kitchen ALPRAZolam (XANAX) 0.5 MG tablet Take 1 tablet three times a day as needed 90 tablet 2  . aspirin (ASPIR-LOW) 81 MG EC tablet Take 81 mg by mouth daily.     Marland Kitchen atorvastatin (LIPITOR) 40 MG tablet TAKE ONE (1) TABLET BY MOUTH EVERY DAY 30 tablet 6  . canagliflozin (INVOKANA) 300 MG TABS tablet TAKE ONE (1) TABLET EACH DAY BEFORE BREAKFAST 30 tablet 5  . diltiazem  (CARDIZEM) 30 MG tablet Take 1 tablet (30 mg total) by mouth 2 (two) times daily. 60 tablet 5  . enalapril (VASOTEC) 10 MG tablet TAKE ONE (1) TABLET AT BEDTIME 30 tablet 5  . gabapentin (NEURONTIN) 300 MG capsule Take 300 mg by mouth 2 (two) times daily.    Marland Kitchen glipiZIDE (GLUCOTROL) 5 MG tablet Take one and a half tablets bid 90 tablet 5  . HYDROcodone-acetaminophen (NORCO) 7.5-325 MG tablet Take 1 tablet by mouth every 4 (four) hours as needed for moderate pain. 30 tablet 0  . metFORMIN (GLUCOPHAGE) 500 MG tablet TAKE TWO TABLET EVERY MORNING, THEN ONE TABLET AT NOON, THEN TWO TABLETS EVERY EVENING 450 tablet 1  . nitroGLYCERIN (NITROSTAT) 0.4 MG SL tablet Place 1 tablet (0.4 mg total) under the tongue every 5 (five) minutes as needed for chest pain. 25 tablet 3  . ranitidine (ZANTAC) 150 MG tablet Take 150 mg by mouth daily as needed for heartburn.    . sertraline (ZOLOFT) 100 MG tablet Take 1 tablet (100 mg total) by mouth daily. 30 tablet 2   No current facility-administered medications for this visit.     Past Medical History:  Diagnosis Date  . Arthritis   . 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 Coronary atherosclerosis of native coronary artery    a. 12/09/2013: s/p PCI with 3.0 x 28 Promus DES to mLAD  . Essential hypertension, benign   . Fatty liver disease, nonalcoholic   . Mixed hyperlipidemia 2006  .  Obesity   . Osgood-Schlatter's disease of right knee   . Type 2 diabetes mellitus ([place])     Past Surgical History:  Procedure Laterality Date  . CARDIAC CATHETERIZATION  2011  . CARDIAC CATHETERIZATION N/A 09/20/2015   Procedure: Left Heart Cath and Coronary Angiography;  Surgeon: [name], MD;  Location: [facility];  Service: Cardiovascular;  Laterality: N/A;  . CHOLECYSTECTOMY  2003  . COLONOSCOPY  06/19/2012   Procedure: COLONOSCOPY;  Surgeon: [name], MD;  [facility];  Service: Endoscopy;  Laterality: N/A;  930  .  KNEE ARTHROPLASTY Right 1999  . LEFT HEART CATHETERIZATION WITH CORONARY ANGIOGRAM N/A 12/09/2013   Procedure: LEFT HEART CATHETERIZATION WITH CORONARY ANGIOGRAM;  Surgeon: [name], MD;  Location: [facility] CATH LAB;  Service: Cardiovascular;  Laterality: N/A;  . Liver biopsy    . TONSILLECTOMY  1970's    Social History   Social History  . Marital status: Married    Spouse name: N/A  . Number of children: N/A  . Years of education: N/A   Occupational History  . Not on file.   Social History Main Topics  . Smoking status: Former Smoker    Packs/day: 0.50    Years: 10.00    Types: Cigarettes    Start date: 02/02/1973    Quit date: 09/17/1983  . Smokeless tobacco: Former Systems developer    Types: [place] date: 09/17/1983  . Alcohol use No     Comment: 11-30-15 per pt no  . Drug use: No     Comment: 11-30-15 per pt no  . Sexual activity: Yes   Other Topics Concern  . 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    Married with 1 child   Right handed    12 th    Very little caffeine     Vitals:   04/23/16 1536  BP: 106/72  Pulse: 81  SpO2: 97%  Weight: 237 lb (107.5 kg)  Height: 6' 2"  (1.88 m)    PHYSICAL EXAM General: NAD HEENT: Normal. Neck: No JVD, no thyromegaly. Lungs: Clear to auscultation bilaterally with normal respiratory effort. CV: Nondisplaced PMI.  Regular rate and rhythm, normal S1/S2, no S3/S4, no murmur. No pretibial or periankle edema.     Abdomen: Soft, nontender, no distention.  Neurologic: Alert and oriented.  Psych: Normal affect. Skin: Normal. Musculoskeletal: No gross deformities.    ECG: Most recent ECG reviewed.      ASSESSMENT AND PLAN: 1. Exertional dyspnea in context of CAD and LAD stent in 11/2013: Stent was patent in 2016. Has 40% mid LAD stenosis, unlikely to have significantly progressed. May be due to small vessel disease. Will try Ranexa  500 mg bid.  2. Dizziness/Essential HTN: Low normal. Will reduce enalapril to 5 mg.  3. Hyperlipidemia: On Lipitor. Dietary and exercise counseling previously provided. No changes to medical therapy.  Dispo: fu 6 weeks.  Kate Sable, M.D., F.A.C.C.

## 2016-04-23 NOTE — Patient Instructions (Addendum)
Medication Instructions:   Decrease Enalapril to 63m daily.  Begin Ranexa 5042mtwice a day.    Continue all other medications.    Labwork: None.  Testing/Procedures: None.  Follow-Up: 6 weeks   Any Other Special Instructions Will Be Listed Below (If Applicable).  Samples & printed script given on the Ranexa today.   If you need a refill on your cardiac medications before your next appointment, please call your pharmacy.

## 2016-04-25 ENCOUNTER — Encounter (HOSPITAL_COMMUNITY): Payer: Self-pay | Admitting: Psychiatry

## 2016-04-25 ENCOUNTER — Ambulatory Visit (INDEPENDENT_AMBULATORY_CARE_PROVIDER_SITE_OTHER): Payer: PRIVATE HEALTH INSURANCE | Admitting: Psychiatry

## 2016-04-25 VITALS — BP 114/80 | HR 83 | Ht 74.0 in | Wt 234.0 lb

## 2016-04-25 DIAGNOSIS — F329 Major depressive disorder, single episode, unspecified: Secondary | ICD-10-CM | POA: Diagnosis not present

## 2016-04-25 DIAGNOSIS — F32A Depression, unspecified: Secondary | ICD-10-CM

## 2016-04-25 MED ORDER — SERTRALINE HCL 100 MG PO TABS: 100.0000 mg | ORAL_TABLET | Freq: Every day | ORAL | 2 refills | Status: DC

## 2016-04-25 MED ORDER — ALPRAZOLAM 0.5 MG PO TABS: ORAL_TABLET | ORAL | 2 refills | Status: DC

## 2016-04-25 NOTE — Progress Notes (Signed)
Patient ID: Jared Griffin, male   DOB: Mar 18, 1961, 55 y.o.   MRN: [id number] Patient ID: Jared Griffin, male   DOB: 1960/10/18, 55 y.o.   MRN: [id number]  Psychiatric  Adult follow-up  Patient Identification: Jared Griffin MRN:  [id number] Date of Evaluation:  04/25/2016 Referral Source: Dr. [name] Chief Complaint:   Chief Complaint    Depression; Anxiety; Follow-up     Visit Diagnosis:    ICD-9-CM ICD-10-CM   1. Depression 311 F32.9    Diagnosis:   Patient Active Problem List   Diagnosis Date Noted  . Cervical nerve root impingement [G54.2] 12/09/2015  . Generalized anxiety disorder [F41.1] 09/27/2015  . Chest pain [R07.9] 09/20/2015  . Pain in the chest [R07.9]   . Depression [F32.9] 01/24/2015  . Esophageal reflux [K21.9] 01/24/2015  . Preoperative cardiovascular examination [Z01.810] 06/28/2014  . Type 2 diabetes mellitus ([place]) [E11.9]   . Mixed hyperlipidemia [E78.2]   . 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 Fatty liver disease, nonalcoholic [M01.0]   . Obesity [E66.9]   . Intermediate coronary syndrome ([place]) [I20.0] 12/09/2013  . Fatty liver [K76.0] 05/05/2013  . Lumbago [M54.5] 05/21/2012  . Essential hypertension, benign [I10] 12/28/2009  . CAD S/P LAD DES March 2015 [I25.10, Z98.61] 12/28/2009  . Hyperlipidemia LDL goal <70 [E78.5] 11/25/2009  . Accelerating angina ([place]) [I20.0] 11/25/2009  . DM [E11.9] 11/24/2009  . ABDOMINAL PAIN, HX OF [Z91.89] 11/24/2009   History of Present Illness:  This patient is a 55 year old married white male lives with his wife and 50 year old son in [place]. 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  The patient was referred by his primary physician, Dr. [name], for further assessment and treatment of anxiety and depression.  The patient states that he is become increasingly depressed and anxious over the last couple of years due  to work stress. He stated that he was always under pressure to get more things done than he had time to do. 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 He got to the point of hating going to work. Several years ago he had cardiac problems and had a stent placed. In January of this year he began developing dizzy spells chest pain headaches stomach aches. It got so bad that he was readmitted for cardiac catheterization but nothing new was found.  The patient was having difficulty sleeping constant worry and anxiety about his job sadness and anger. He was getting irritable with people at home. After talking to his primary care physician at length and decided to take him out on medical retirement. The patient has been on Wellbutrin SR 150 no gram twice a day for several weeks now. He had also been out in the past when his wife was sick. He has not seen any benefit from this but does feel tremendously better since he stopped working. He is also on Xanax 0.5 mg 3 times a day which he thinks has helped to some degree.  Since stopping working he's had a big turnaround. His sleep and energy have improved. 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 He is much less anxious and is no longer having the chest pain dizzy spells or stomachaches. He denies suicidal ideation or psychotic symptoms. He does not use  drugs or alcohol  The patient returns after 2 months. Last time we increased his Zoloft and it seems to have helped his anxiety and mood. 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 He needs to bring some money and because he is discovered additional debts that his wife incurred. Overall however his sleep and energy have improved and his mood is better as well. He was seen by cardiology this week and was thought to have possible small vessel disease) and renexa was added to  his regimen Elements:  Location:  Global. Quality:  Improving. Severity:  Moderate. Timing:  Daily. Duration:  Approximately 2 years. Context:  Stressful work environment. Associated Signs/Symptoms: Depression Symptoms:  depressed mood, anhedonia, psychomotor agitation, fatigue, difficulty concentrating, anxiety, loss of energy/fatigue, disturbed sleep, (Hypo) Manic Symptoms:  Irritable Mood, Anxiety Symptoms:  Excessive Worry,   Past Medical History:  Past Medical History:  Diagnosis Date  . Arthritis   . 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 Coronary atherosclerosis of native coronary artery    a. 12/09/2013: s/p PCI with 3.0 x 28 Promus DES to mLAD  . Essential hypertension, benign   . Fatty liver disease, nonalcoholic   . Mixed hyperlipidemia 2006  . Obesity   . Osgood-Schlatter's disease of right knee   . Type 2 diabetes mellitus ([place])     Past Surgical History:  Procedure Laterality Date  . CARDIAC CATHETERIZATION  2011  . CARDIAC CATHETERIZATION N/A 09/20/2015   Procedure: Left Heart Cath and Coronary Angiography;  Surgeon: [name], MD;  Location: [facility];  Service: Cardiovascular;  Laterality: N/A;  . CHOLECYSTECTOMY  2003  . COLONOSCOPY  06/19/2012   Procedure: COLONOSCOPY;  Surgeon: [name], MD;  Location: [facility];  Service: Endoscopy;  Laterality: N/A;  930  . KNEE ARTHROPLASTY Right 1999  . LEFT HEART CATHETERIZATION WITH CORONARY ANGIOGRAM N/A 12/09/2013   Procedure: LEFT HEART CATHETERIZATION WITH CORONARY ANGIOGRAM;  Surgeon: [name], MD;  Location: [facility] CATH LAB;  Service: Cardiovascular;  Laterality: N/A;  . Liver biopsy    . TONSILLECTOMY  1970's   Family History:  Family History  Problem Relation Age of Onset  . Cancer Maternal Aunt   . CVA Maternal Grandmother   . CVA Maternal Grandfather    Social History:   Social History   Social History  . Marital status: Married    Spouse  name: N/A  . Number of children: N/A  . Years of education: N/A   Social History Main Topics  . Smoking status: Former Smoker    Packs/day: 0.50    Years: 10.00    Types: Cigarettes    Start date: 02/02/1973    Quit date: 09/17/1983  . Smokeless tobacco: Former Systems developer    Types: [place] date: 09/17/1983  . Alcohol use No     Comment: 11-30-15 per pt no  . Drug use: No     Comment: 11-30-15 per pt no  . Sexual activity: Yes   Other Topics Concern  . 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    Married with 1 child   Right handed    12 th    Very little caffeine   Additional Social History: The patient grew up primarily in [place] with both parents. He states he has another sister but  she is not involved in the family and he has had little contact with her. He denies any history of abuse or trauma. 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 He is married and has one 11 year old son. His wife has had severe gastric reflux and has had to go on disability as well  Musculoskeletal: Strength & Muscle Tone: within normal limits Gait & Station: normal Patient leans: N/A  Psychiatric Specialty Exam: Depression         Past medical history includes anxiety.   Anxiety       Review of Systems  Psychiatric/Behavioral: Positive for depression.    Blood pressure 114/80, pulse 83, height 6' 2"  (1.88 m), weight 234 lb (106.1 kg), SpO2 94 %.Body mass index is 30.04 kg/m.  General Appearance: Casual, Neat and Well Groomed  Eye Contact:  Good  Speech:  Clear and Coherent  Volume:  Normal  Mood:  good  Affect: Congruent     Orientation:  Full (Time, Place, and Person)  Thought Content:  WDL  Suicidal Thoughts:  No  Homicidal Thoughts:  No  Memory:  Immediate;   Good Recent;   Good Remote;   Good  Judgement:  Good  Insight:   Good  Psychomotor Activity:  Normal  Concentration:  Good  Recall:  Good  Fund of Knowledge:Good  Language: Good  Akathisia:  No  Handed:  Right  AIMS (if indicated):    Assets:  Communication Skills Desire for Improvement Resilience Social Support Talents/Skills  ADL's:  Intact  Cognition: WNL  Sleep:  ok   Is the patient at risk to self?  No. Has the patient been a risk to self in the past 6 months?  No. Has the patient been a risk to self within the distant past?  No. Is the patient a risk to others?  No. Has the patient been a risk to others in the past 6 months?  No. Has the patient been a risk to others within the distant past?  No.  Allergies:   Allergies  Allergen Reactions  . Metoprolol Rash   Current Medications: Current Outpatient Prescriptions  Medication Sig Dispense Refill  . acetaminophen (TYLENOL) 500 MG tablet Take 1,000 mg by mouth daily as needed for headache. Pain.    Marland Kitchen ALPRAZolam (XANAX) 0.5 MG tablet Take 1 tablet three times a day as needed 90 tablet 2  . aspirin (ASPIR-LOW) 81 MG EC tablet Take 81 mg by mouth daily.     Marland Kitchen atorvastatin (LIPITOR) 40 MG tablet TAKE ONE (1) TABLET BY MOUTH EVERY DAY 30 tablet 6  . canagliflozin (INVOKANA) 300 MG TABS tablet TAKE ONE (1) TABLET EACH DAY BEFORE BREAKFAST 30 tablet 5  . diltiazem (CARDIZEM) 30 MG tablet Take 1 tablet (30 mg total) by mouth 2 (two) times daily. 60 tablet 5  . enalapril (VASOTEC) 5 MG tablet Take 1 tablet (5 mg total) by mouth daily. 30 tablet 6  . gabapentin (NEURONTIN) 300 MG capsule Take 300 mg by mouth 2 (two) times daily.    Marland Kitchen glipiZIDE (GLUCOTROL) 5 MG tablet Take one and a half tablets bid 90 tablet 5  . HYDROcodone-acetaminophen (NORCO) 7.5-325 MG tablet Take 1 tablet by mouth every 4 (four) hours as needed for moderate pain. 30 tablet 0  . metFORMIN (GLUCOPHAGE) 500 MG tablet TAKE TWO TABLET EVERY MORNING, THEN ONE TABLET AT NOON, THEN TWO TABLETS EVERY EVENING 450  tablet 1  .  nitroGLYCERIN (NITROSTAT) 0.4 MG SL tablet Place 1 tablet (0.4 mg total) under the tongue every 5 (five) minutes as needed for chest pain. 25 tablet 3  . ranitidine (ZANTAC) 150 MG tablet Take 150 mg by mouth daily as needed for heartburn.    . ranolazine (RANEXA) 500 MG 12 hr tablet Take 1 tablet (500 mg total) by mouth 2 (two) times daily. 60 tablet 6  . sertraline (ZOLOFT) 100 MG tablet Take 1 tablet (100 mg total) by mouth daily. 30 tablet 2   No current facility-administered medications for this visit.     Previous Psychotropic Medications: Yes   Substance Abuse History in the last 12 months:  No.  Consequences of Substance Abuse: NA  Medical Decision Making:  Review of Psycho-Social Stressors (1), Review or order clinical lab tests (1), Review and summation of old records (2), Review of Medication Regimen & Side Effects (2) and Review of New Medication or Change in Dosage (2)  Treatment Plan Summary: Medication management  The patient will continueZoloft  100 mg daily for depression and anxiety. He'll continue Xanax 0.5 mg up to 3 times a day for anxiety as well. He was offered counseling here but declined. He'll return to see me in 3 months    Jos Cygan, [employer] [facility] 8/9/201710:57 AM

## 2016-05-01 ENCOUNTER — Encounter: Payer: Self-pay | Admitting: Family Medicine

## 2016-05-01 ENCOUNTER — Ambulatory Visit (INDEPENDENT_AMBULATORY_CARE_PROVIDER_SITE_OTHER): Payer: PRIVATE HEALTH INSURANCE | Admitting: Family Medicine

## 2016-05-01 VITALS — BP 110/76 | Ht 74.0 in | Wt 233.0 lb

## 2016-05-01 DIAGNOSIS — E119 Type 2 diabetes mellitus without complications: Secondary | ICD-10-CM | POA: Diagnosis not present

## 2016-05-01 DIAGNOSIS — Z0000 Encounter for general adult medical examination without abnormal findings: Secondary | ICD-10-CM | POA: Diagnosis not present

## 2016-05-01 LAB — LIPID PANEL
Chol/HDL Ratio: 3.9 ratio units (ref 0.0–5.0)
Cholesterol, Total: 132 mg/dL (ref 100–199)
HDL: 34 mg/dL — ABNORMAL LOW (ref >39)
LDL Calculated: 50 mg/dL (ref 0–99)
Triglycerides: 241 mg/dL — ABNORMAL HIGH (ref 0–149)
VLDL Cholesterol Cal: 48 mg/dL — ABNORMAL HIGH (ref 5–40)

## 2016-05-01 LAB — HEPATIC FUNCTION PANEL
ALT: 35 IU/L (ref 0–44)
AST: 25 IU/L (ref 0–40)
Albumin: 4.3 g/dL (ref 3.5–5.5)
Alkaline Phosphatase: 41 IU/L (ref 39–117)
Bilirubin Total: 0.8 mg/dL (ref 0.0–1.2)
Bilirubin, Direct: 0.24 mg/dL (ref 0.00–0.40)
Total Protein: 6.5 g/dL (ref 6.0–8.5)

## 2016-05-01 LAB — BASIC METABOLIC PANEL
BUN/Creatinine Ratio: 18 (ref 9–20)
BUN: 17 mg/dL (ref 6–24)
CO2: 23 mmol/L (ref 18–29)
Calcium: 9.7 mg/dL (ref 8.7–10.2)
Chloride: 97 mmol/L (ref 96–106)
Creatinine, Ser: 0.92 mg/dL (ref 0.76–1.27)
GFR calc Af Amer: 109 mL/min/{1.73_m2} (ref >59)
GFR calc non Af Amer: 94 mL/min/{1.73_m2} (ref >59)
Glucose: 204 mg/dL — ABNORMAL HIGH (ref 65–99)
Potassium: 4.4 mmol/L (ref 3.5–5.2)
Sodium: 140 mmol/L (ref 134–144)

## 2016-05-01 LAB — PSA: Prostate Specific Ag, Serum: 0.2 ng/mL (ref 0.0–4.0)

## 2016-05-01 LAB — MICROALBUMIN / CREATININE URINE RATIO
Creatinine, Urine: 57.4 mg/dL
MICROALB/CREAT RATIO: <5.2 mg/g creat (ref 0.0–30.0)
Microalbumin, Urine: <3 ug/mL

## 2016-05-01 LAB — POCT GLYCOSYLATED HEMOGLOBIN (HGB A1C): Hemoglobin A1C: 7.3

## 2016-05-01 MED ORDER — CANAGLIFLOZIN 300 MG PO TABS: ORAL_TABLET | ORAL | 5 refills | Status: DC

## 2016-05-01 MED ORDER — DILTIAZEM HCL 30 MG PO TABS: 30.0000 mg | ORAL_TABLET | Freq: Two times a day (BID) | ORAL | 5 refills | Status: DC

## 2016-05-01 MED ORDER — GLIPIZIDE 5 MG PO TABS: ORAL_TABLET | ORAL | 5 refills | Status: DC

## 2016-05-01 MED ORDER — METFORMIN HCL 500 MG PO TABS: ORAL_TABLET | ORAL | 1 refills | Status: DC

## 2016-05-01 NOTE — Progress Notes (Signed)
   Subjective:    Patient ID: Jared Griffin, male    DOB: [birth year], 55 y.o.   MRN: [id number]  HPI The patient comes in today for a wellness visit.  Results for orders placed or performed in visit on 05/01/16  POCT HgB A1C  Result Value Ref Range   Hemoglobin A1C 7.3    diab not ideal numbers up at times, fasting numbers up, walking less now      A review of their health history was completed.  A review of medications was also completed.  Any needed refills; Yes  Eating habits: Patient states eating habits are fairly good. Eats salads and other vegetables. Eats fried foods sparingly.  Falls/  MVA accidents in past few months: None Regular exercise: Patient states walks 3-4 days per week.  Specialist pt sees on regular basis: Dr.Koneswarn   Preventative health issues were discussed. (Cardiology)  Additional concerns: Patient states no other concerns this visit.   Review of Systems  Constitutional: Negative for activity change, appetite change and fever.  HENT: Negative for congestion and rhinorrhea.   Eyes: Negative for discharge.  Respiratory: Negative for cough and wheezing.   Cardiovascular: Negative for chest pain.  Gastrointestinal: Negative for abdominal pain, blood in stool and vomiting.  Genitourinary: Negative for difficulty urinating and frequency.  Musculoskeletal: Negative for neck pain.  Skin: Negative for rash.  Allergic/Immunologic: Negative for environmental allergies and food allergies.  Neurological: Negative for weakness and headaches.  Psychiatric/Behavioral: Negative for agitation.  All other systems reviewed and are negative.      Objective:   Physical Exam  Constitutional: He appears well-developed and well-nourished.  HENT:  Head: Normocephalic and atraumatic.  Right Ear: External ear normal.  Left Ear: External ear normal.  Nose: Nose normal.  Mouth/Throat: Oropharynx is clear and moist.  Eyes: EOM are normal. Pupils are equal,  round, and reactive to light.  Neck: Normal range of motion. Neck supple. No thyromegaly present.  Cardiovascular: Normal rate, regular rhythm and normal heart sounds.   No murmur heard. Pulmonary/Chest: Effort normal and breath sounds normal. No respiratory distress. He has no wheezes.  Abdominal: Soft. Bowel sounds are normal. He exhibits no distension and no mass. There is no tenderness.  Genitourinary: Penis normal.  Musculoskeletal: Normal range of motion. He exhibits no edema.  Lymphadenopathy:    He has no cervical adenopathy.  Neurological: He is alert. He exhibits normal muscle tone.  Skin: Skin is warm and dry. No erythema.  Psychiatric: He has a normal mood and affect. His behavior is normal. Judgment normal.  Vitals reviewed.  he       Assessment & Plan:  Prostate gland within normal limits(impression #1 impression impression #1 well adult exam #2 type 2 diabetes control good though not perfect discussed at length need to just medication #3 coronary artery disease #4 chronic stress anxiety followed by psychiatrist for this plan increase Glucotrol rationale discussed. Diet exercise discussed. Medications refilled. Blood work reviewed [employer]

## 2016-05-04 ENCOUNTER — Telehealth: Payer: Self-pay | Admitting: *Deleted

## 2016-05-04 NOTE — Telephone Encounter (Signed)
Pharmacy aware

## 2016-05-04 NOTE — Telephone Encounter (Signed)
-----   Message from Arnoldo Lenis, MD sent at 05/04/2016 11:55 AM EDT ----- I reviwed drug information, there is some interaction however the medications can be used together at the current doses. Typically you want to avoid higher doses of ranexa, but 5107m bid is fine  JZandra AbtsMD ----- Message ----- From: SMassie Maroon CMA Sent: 05/04/2016  10:22 AM To: JArnoldo Lenis MD  Pharmacy called for recent rx of Ranexa given 8/7 by Dr. KBronson Ing Says interaction with diltiazem, looks like pt has been on dilt for a while, just wanted to double check with you. Thanks  [employer]

## 2016-05-13 ENCOUNTER — Encounter: Payer: Self-pay | Admitting: Family Medicine

## 2016-05-30 ENCOUNTER — Ambulatory Visit (INDEPENDENT_AMBULATORY_CARE_PROVIDER_SITE_OTHER): Payer: PRIVATE HEALTH INSURANCE | Admitting: Nurse Practitioner

## 2016-05-30 ENCOUNTER — Encounter: Payer: Self-pay | Admitting: Nurse Practitioner

## 2016-05-30 VITALS — BP 122/76 | Temp 98.4°F | Ht 74.0 in | Wt 233.2 lb

## 2016-05-30 DIAGNOSIS — J329 Chronic sinusitis, unspecified: Secondary | ICD-10-CM

## 2016-05-30 MED ORDER — AMOXICILLIN-POT CLAVULANATE 875-125 MG PO TABS: 1.0000 | ORAL_TABLET | Freq: Two times a day (BID) | ORAL | 0 refills | Status: DC

## 2016-05-30 MED ORDER — HYDROCODONE-HOMATROPINE 5-1.5 MG/5ML PO SYRP: 5.0000 mL | ORAL_SOLUTION | ORAL | 0 refills | Status: DC | PRN

## 2016-05-31 ENCOUNTER — Encounter: Payer: Self-pay | Admitting: Nurse Practitioner

## 2016-05-31 NOTE — Progress Notes (Signed)
Subjective:  Presents complaints of congestion and cough for the past 5 days. No fever. Sore throat. Facial area headache. Now producing thick green drainage. Occasional cough worse at night. No wheezing. Ear pressure. NAD. Alert, oriented.  Objective:   BP 122/76   Temp 98.4 F (36.9 C) (Oral)   Ht 6' 2"  (1.88 m)   Wt 233 lb 3.2 oz (105.8 kg)   BMI 29.94 kg/m  NAD. Alert, oriented. TMs clear effusion, no erythema. Pharynx mildly erythematous with green PND noted. Neck supple with mild soft anterior adenopathy. Lungs clear. Heart regular rate rhythm.  Assessment: Rhinosinusitis  Plan:  Meds ordered this encounter  Medications  . amoxicillin-clavulanate (AUGMENTIN) 875-125 MG tablet    Sig: Take 1 tablet by mouth 2 (two) times daily.    Dispense:  20 tablet    Refill:  0    Order Specific Question:   Supervising Provider    Answer:   Mikey Kirschner [[id number]]  . HYDROcodone-homatropine (HYCODAN) 5-1.5 MG/5ML syrup    Sig: Take 5 mLs by mouth every 4 (four) hours as needed.    Dispense:  90 mL    Refill:  0    Order Specific Question:   Supervising Provider    Answer:   Mikey Kirschner [[id number]]   Continue OTC meds as directed. Call back in 5-7 days if no improvement, sooner if worse.

## 2016-06-13 ENCOUNTER — Telehealth: Payer: Self-pay | Admitting: Family Medicine

## 2016-06-13 MED ORDER — LEVOFLOXACIN 500 MG PO TABS: 500.0000 mg | ORAL_TABLET | Freq: Every day | ORAL | 0 refills | Status: AC

## 2016-06-13 NOTE — Telephone Encounter (Signed)
Patient seen on 05/30/16 for rhino sinusitis.  He has finished his antibiotic.  He is still having congestion and pressure in ears and would like to know if we can call something else in.  [place]

## 2016-06-13 NOTE — Telephone Encounter (Signed)
Prescription sent electronically to pharmacy. Patient notified. 

## 2016-06-13 NOTE — Telephone Encounter (Signed)
Patient given Augmentin 05/30/16

## 2016-06-13 NOTE — Telephone Encounter (Signed)
levaquin 500 qd ten d

## 2016-06-19 ENCOUNTER — Ambulatory Visit: Payer: Self-pay | Admitting: Cardiovascular Disease

## 2016-07-11 ENCOUNTER — Ambulatory Visit (INDEPENDENT_AMBULATORY_CARE_PROVIDER_SITE_OTHER): Payer: PRIVATE HEALTH INSURANCE | Admitting: *Deleted

## 2016-07-11 ENCOUNTER — Ambulatory Visit: Payer: PRIVATE HEALTH INSURANCE

## 2016-07-11 DIAGNOSIS — Z23 Encounter for immunization: Secondary | ICD-10-CM | POA: Diagnosis not present

## 2016-07-26 ENCOUNTER — Ambulatory Visit (INDEPENDENT_AMBULATORY_CARE_PROVIDER_SITE_OTHER): Payer: PRIVATE HEALTH INSURANCE | Admitting: Psychiatry

## 2016-07-26 ENCOUNTER — Encounter (HOSPITAL_COMMUNITY): Payer: Self-pay | Admitting: Psychiatry

## 2016-07-26 VITALS — BP 120/77 | HR 88 | Ht 74.0 in | Wt 232.2 lb

## 2016-07-26 DIAGNOSIS — F321 Major depressive disorder, single episode, moderate: Secondary | ICD-10-CM | POA: Diagnosis not present

## 2016-07-26 DIAGNOSIS — Z87891 Personal history of nicotine dependence: Secondary | ICD-10-CM | POA: Diagnosis not present

## 2016-07-26 DIAGNOSIS — Z79899 Other long term (current) drug therapy: Secondary | ICD-10-CM | POA: Diagnosis not present

## 2016-07-26 MED ORDER — SERTRALINE HCL 100 MG PO TABS: 100.0000 mg | ORAL_TABLET | Freq: Every day | ORAL | 2 refills | Status: DC

## 2016-07-26 MED ORDER — ALPRAZOLAM 0.5 MG PO TABS: ORAL_TABLET | ORAL | 2 refills | Status: DC

## 2016-07-26 NOTE — Progress Notes (Signed)
Patient ID: Jared Griffin, male   DOB: 1961-08-30, 55 y.o.   MRN: [id number] Patient ID: Jared Griffin, male   DOB: 11/20/1960, 55 y.o.   MRN: [id number]  Psychiatric  Adult follow-up  Patient Identification: Jared Griffin MRN:  [id number] Date of Evaluation:  07/26/2016 Referral Source: Dr. [name] Chief Complaint:   Chief Complaint    Depression; Anxiety; Follow-up     Visit Diagnosis:    ICD-9-CM ICD-10-CM   1. Moderate single current episode of major depressive disorder ([place]) 296.22 F32.1    Diagnosis:   Patient Active Problem List   Diagnosis Date Noted  . Cervical nerve root impingement [G54.2] 12/09/2015  . Generalized anxiety disorder [F41.1] 09/27/2015  . Chest pain [R07.9] 09/20/2015  . Pain in the chest [R07.9]   . Depression [F32.9] 01/24/2015  . Esophageal reflux [K21.9] 01/24/2015  . Preoperative cardiovascular examination [Z01.810] 06/28/2014  . Type 2 diabetes mellitus ([place]) [E11.9]   . Mixed hyperlipidemia [E78.2]   . 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 Fatty liver disease, nonalcoholic [I78.6]   . Obesity [E66.9]   . Intermediate coronary syndrome ([place]) [I20.0] 12/09/2013  . Fatty liver [K76.0] 05/05/2013  . Lumbago [M54.5] 05/21/2012  . Essential hypertension, benign [I10] 12/28/2009  . CAD S/P LAD DES March 2015 [I25.10, Z98.61] 12/28/2009  . Hyperlipidemia LDL goal <70 [E78.5] 11/25/2009  . Accelerating angina ([place]) [I20.0] 11/25/2009  . DM [E11.9] 11/24/2009  . ABDOMINAL PAIN, HX OF [Z91.89] 11/24/2009   History of Present Illness:  This patient is a 55 year old married white male lives with his wife and 57 year old son in [place]. 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  The patient was referred by his primary physician, Dr. [name], for further assessment and treatment of anxiety and depression.  The patient states that he is become  increasingly depressed and anxious over the last couple of years due to work stress. He stated that he was always under pressure to get more things done than he had time to do. 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 He got to the point of hating going to work. Several years ago he had cardiac problems and had a stent placed. In January of this year he began developing dizzy spells chest pain headaches stomach aches. It got so bad that he was readmitted for cardiac catheterization but nothing new was found.  The patient was having difficulty sleeping constant worry and anxiety about his job sadness and anger. He was getting irritable with people at home. After talking to his primary care physician at length and decided to take him out on medical retirement. The patient has been on Wellbutrin SR 150 no gram twice a day for several weeks now. He had also been out in the past when his wife was sick. He has not seen any benefit from this but does feel tremendously better since he stopped working. He is also on Xanax 0.5 mg 3 times a day which he thinks has helped to some degree.  Since stopping working he's had a big turnaround. His sleep and energy have improved. 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 He is much less anxious and is no longer having the chest pain dizzy spells or stomachaches. He denies suicidal  ideation or psychotic symptoms. He does not use drugs or alcohol  The patient returns after 3 months. 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 He states that his mood is good. He's not having the dizzy spells or panic attacks anymore and still uses 2 Xanax per day. He is sleeping well at night. His cardiac status is good as well. He feels that his medications have been helpful. 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 Quality:  Improving. Severity:  Moderate. Timing:  Daily. Duration:  Approximately 2 years. Context:  Stressful work environment. Associated Signs/Symptoms: Depression Symptoms:  depressed mood, anhedonia, psychomotor agitation, fatigue, difficulty concentrating, anxiety, loss of energy/fatigue, disturbed sleep, (Hypo) Manic Symptoms:  Irritable Mood, Anxiety Symptoms:  Excessive Worry,   Past Medical History:  Past Medical History:  Diagnosis Date  . Arthritis   . 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 Coronary atherosclerosis of native coronary artery    a. 12/09/2013: s/p PCI with 3.0 x 28 Promus DES to mLAD  . Essential hypertension, benign   . Fatty liver disease, nonalcoholic   . Mixed hyperlipidemia 2006  . Obesity   . Osgood-Schlatter's disease of right knee   . Type 2 diabetes mellitus ([place])     Past Surgical History:  Procedure Laterality Date  . CARDIAC CATHETERIZATION  2011  . CARDIAC CATHETERIZATION N/A 09/20/2015   Procedure: Left Heart Cath and Coronary Angiography;  Surgeon: [name], MD;  Location: [facility];  Service: Cardiovascular;  Laterality: N/A;  . CHOLECYSTECTOMY  2003  . COLONOSCOPY  06/19/2012   Procedure: COLONOSCOPY;  Surgeon: [name], MD;  Location: [facility];  Service: Endoscopy;  Laterality: N/A;  930  . KNEE ARTHROPLASTY Right 1999  . LEFT HEART CATHETERIZATION WITH CORONARY ANGIOGRAM N/A 12/09/2013   Procedure: LEFT HEART CATHETERIZATION WITH CORONARY ANGIOGRAM;  Surgeon: [name], MD;  Location: [facility] CATH LAB;  Service: Cardiovascular;  Laterality: N/A;  . Liver biopsy    . TONSILLECTOMY  1970's   Family History:  Family History  Problem Relation Age of Onset  . Cancer Maternal Aunt   . CVA Maternal Grandmother   . CVA Maternal Grandfather    Social History:   Social History    Social History  . Marital status: Married    Spouse name: N/A  . Number of children: N/A  . Years of education: N/A   Social History Main Topics  . Smoking status: Former Smoker    Packs/day: 0.50    Years: 10.00    Types: Cigarettes    Start date: 02/02/1973    Quit date: 09/17/1983  . Smokeless tobacco: Former Systems developer    Types: [place] date: 09/17/1983  . Alcohol use No     Comment: 11-30-15 per pt no  . Drug use: No     Comment: 11-30-15 per pt no  . Sexual activity: Yes   Other Topics Concern  . 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    Married with 1 child   Right handed    12 th    Very little caffeine   Additional Social History: The patient grew up primarily in [place] with both parents. He states  he has another sister but she is not involved in the family and he has had little contact with her. He denies any history of abuse or trauma. 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 He is married and has one 73 year old son. His wife has had severe gastric reflux and has had to go on disability as well  Musculoskeletal: Strength & Muscle Tone: within normal limits Gait & Station: normal Patient leans: N/A  Psychiatric Specialty Exam: Depression         Past medical history includes anxiety.   Anxiety       Review of Systems  Psychiatric/Behavioral: Positive for depression.    Blood pressure 120/77, pulse 88, height 6' 2"  (1.88 m), weight 232 lb 3.2 oz (105.3 kg).Body mass index is 29.81 kg/m.  General Appearance: Casual, Neat and Well Groomed  Eye Contact:  Good  Speech:  Clear and Coherent  Volume:  Normal  Mood:  good  Affect: Congruent     Orientation:  Full (Time, Place, and Person)  Thought Content:  WDL  Suicidal Thoughts:  No  Homicidal Thoughts:  No  Memory:  Immediate;   Good Recent;    Good Remote;   Good  Judgement:  Good  Insight:  Good  Psychomotor Activity:  Normal  Concentration:  Good  Recall:  Good  Fund of Knowledge:Good  Language: Good  Akathisia:  No  Handed:  Right  AIMS (if indicated):    Assets:  Communication Skills Desire for Improvement Resilience Social Support Talents/Skills  ADL's:  Intact  Cognition: WNL  Sleep:  ok   Is the patient at risk to self?  No. Has the patient been a risk to self in the past 6 months?  No. Has the patient been a risk to self within the distant past?  No. Is the patient a risk to others?  No. Has the patient been a risk to others in the past 6 months?  No. Has the patient been a risk to others within the distant past?  No.  Allergies:   Allergies  Allergen Reactions  . Metoprolol Rash   Current Medications: Current Outpatient Prescriptions  Medication Sig Dispense Refill  . acetaminophen (TYLENOL) 500 MG tablet Take 1,000 mg by mouth daily as needed for headache. Pain.    Marland Kitchen ALPRAZolam (XANAX) 0.5 MG tablet Take 1 tablet three times a day as needed 90 tablet 2  . aspirin (ASPIR-LOW) 81 MG EC tablet Take 81 mg by mouth daily.     Marland Kitchen atorvastatin (LIPITOR) 40 MG tablet TAKE ONE (1) TABLET BY MOUTH EVERY DAY 30 tablet 6  . canagliflozin (INVOKANA) 300 MG TABS tablet TAKE ONE (1) TABLET EACH DAY BEFORE BREAKFAST 30 tablet 5  . diltiazem (CARDIZEM) 30 MG tablet Take 1 tablet (30 mg total) by mouth 2 (two) times daily. 60 tablet 5  . enalapril (VASOTEC) 5 MG tablet Take 1 tablet (5 mg total) by mouth daily. 30 tablet 6  . gabapentin (NEURONTIN) 300 MG capsule Take 300 mg by mouth 2 (two) times daily.    Marland Kitchen glipiZIDE (GLUCOTROL) 5 MG tablet Take 2 tablets by mouth twice a daily. 120 tablet 5  . HYDROcodone-acetaminophen (NORCO) 7.5-325 MG tablet Take 1 tablet by mouth every 4 (four) hours as needed for moderate pain. 30 tablet 0  . HYDROcodone-homatropine (HYCODAN) 5-1.5 MG/5ML syrup Take 5 mLs by mouth every 4  (four) hours as  needed. 90 mL 0  . metFORMIN (GLUCOPHAGE) 500 MG tablet TAKE TWO TABLET EVERY MORNING, THEN ONE TABLET AT NOON, THEN TWO TABLETS EVERY EVENING 450 tablet 1  . nitroGLYCERIN (NITROSTAT) 0.4 MG SL tablet Place 1 tablet (0.4 mg total) under the tongue every 5 (five) minutes as needed for chest pain. 25 tablet 3  . ranitidine (ZANTAC) 150 MG tablet Take 150 mg by mouth daily as needed for heartburn.    . ranolazine (RANEXA) 500 MG 12 hr tablet Take 1 tablet (500 mg total) by mouth 2 (two) times daily. 60 tablet 6  . sertraline (ZOLOFT) 100 MG tablet Take 1 tablet (100 mg total) by mouth daily. 30 tablet 2   No current facility-administered medications for this visit.     Previous Psychotropic Medications: Yes   Substance Abuse History in the last 12 months:  No.  Consequences of Substance Abuse: NA  Medical Decision Making:  Review of Psycho-Social Stressors (1), Review or order clinical lab tests (1), Review and summation of old records (2), Review of Medication Regimen & Side Effects (2) and Review of New Medication or Change in Dosage (2)  Treatment Plan Summary: Medication management  The patient will continueZoloft  100 mg daily for depression and anxiety. He'll continue Xanax 0.5 mg up to 3 times a day for anxiety as well.He'll return to see me in 3 months    Rayyan Burley, [place] 11/9/201710:49 AM

## 2016-07-31 ENCOUNTER — Ambulatory Visit: Payer: Self-pay | Admitting: Cardiovascular Disease

## 2016-08-08 ENCOUNTER — Other Ambulatory Visit: Payer: Self-pay | Admitting: Family Medicine

## 2016-08-22 ENCOUNTER — Other Ambulatory Visit: Payer: Self-pay | Admitting: Family Medicine

## 2016-08-23 ENCOUNTER — Ambulatory Visit (INDEPENDENT_AMBULATORY_CARE_PROVIDER_SITE_OTHER): Payer: PRIVATE HEALTH INSURANCE | Admitting: Family Medicine

## 2016-08-23 ENCOUNTER — Encounter: Payer: Self-pay | Admitting: Family Medicine

## 2016-08-23 VITALS — BP 120/80 | Temp 98.1°F | Ht 74.0 in | Wt 235.2 lb

## 2016-08-23 DIAGNOSIS — J019 Acute sinusitis, unspecified: Secondary | ICD-10-CM

## 2016-08-23 DIAGNOSIS — B9689 Other specified bacterial agents as the cause of diseases classified elsewhere: Secondary | ICD-10-CM

## 2016-08-23 MED ORDER — AMOXICILLIN-POT CLAVULANATE 875-125 MG PO TABS: 1.0000 | ORAL_TABLET | Freq: Two times a day (BID) | ORAL | 0 refills | Status: DC

## 2016-08-23 NOTE — Progress Notes (Signed)
   Subjective:    Patient ID: Jared Griffin, male    DOB: 10/27/1960, 55 y.o.   MRN: [id number]  Sinusitis  This is a new problem. The current episode started 1 to 4 weeks ago. The problem is unchanged. There has been no fever. The pain is moderate. Associated symptoms include congestion, coughing and a sore throat. Pertinent negatives include no ear pain. Past treatments include oral decongestants. The treatment provided no relief.   Patient states that he has no other concerns at this time.  The patient also relates that he has a lot of postnasal drip often congestion has been going on for several months he brings up phlegm every morning that's whitish in it in the back of his throat he also states over the past month he's had intermittent dry tickle cough that he can't quite shake he denies any wheezing or difficulty breathing. Does have history of smoking previous pulmonary function test did not show COPD chest x-ray had in the summertime did not show any tumor or mass   Review of Systems  Constitutional: Negative for activity change and fever.  HENT: Positive for congestion, rhinorrhea and sore throat. Negative for ear pain.   Eyes: Negative for discharge.  Respiratory: Positive for cough. Negative for wheezing.   Cardiovascular: Negative for chest pain.   He denies hemoptysis    Objective:   Physical Exam  Constitutional: He appears well-developed.  HENT:  Head: Normocephalic.  Mouth/Throat: Oropharynx is clear and moist. No oropharyngeal exudate.  Neck: Normal range of motion.  Cardiovascular: Normal rate, regular rhythm and normal heart sounds.   No murmur heard. Pulmonary/Chest: Effort normal and breath sounds normal. He has no wheezes.  Lymphadenopathy:    He has no cervical adenopathy.  Neurological: He exhibits normal muscle tone.  Skin: Skin is warm and dry.  Nursing note and vitals reviewed.  Chronic throat congestion could be related to multiple different things it  is possible could be postnasal drip I doubt that it is reflux related issues  Dry cough could be ACE inhibitor related cough       Assessment & Plan:  Viral illness Secondary rhinosinusitis Antibiotics are scribed warning signs discussed follow-up of problems  Chronic cough possible postnasal drip allergy medicine along with Flonase to see if this will help  It is possible that the dry tickle cough he has had over the past few weeks has been ACE inhibitor related cough he will talk with his cardiologist regarding this. Although ARB medications can be associated with cough it is possible that his been current Vasotec will be switch to an ARB we will leave this decision to cardiology

## 2016-09-11 ENCOUNTER — Other Ambulatory Visit: Payer: Self-pay | Admitting: *Deleted

## 2016-09-11 ENCOUNTER — Emergency Department (HOSPITAL_COMMUNITY): Payer: PRIVATE HEALTH INSURANCE

## 2016-09-11 ENCOUNTER — Emergency Department (HOSPITAL_COMMUNITY)
Admission: EM | Admit: 2016-09-11 | Discharge: 2016-09-11 | Disposition: A | Discharge status: Home / Self Care | Payer: PRIVATE HEALTH INSURANCE | Attending: Emergency Medicine | Admitting: Emergency Medicine

## 2016-09-11 ENCOUNTER — Telehealth: Payer: Self-pay | Admitting: Family Medicine

## 2016-09-11 ENCOUNTER — Encounter (HOSPITAL_COMMUNITY): Payer: Self-pay | Admitting: Emergency Medicine

## 2016-09-11 DIAGNOSIS — Z87891 Personal history of nicotine dependence: Secondary | ICD-10-CM | POA: Diagnosis not present

## 2016-09-11 DIAGNOSIS — Z79899 Other long term (current) drug therapy: Secondary | ICD-10-CM | POA: Diagnosis not present

## 2016-09-11 DIAGNOSIS — E119 Type 2 diabetes mellitus without complications: Secondary | ICD-10-CM | POA: Diagnosis not present

## 2016-09-11 DIAGNOSIS — I10 Essential (primary) hypertension: Secondary | ICD-10-CM | POA: Insufficient documentation

## 2016-09-11 DIAGNOSIS — Z7984 Long term (current) use of oral hypoglycemic drugs: Secondary | ICD-10-CM | POA: Insufficient documentation

## 2016-09-11 DIAGNOSIS — I2510 Atherosclerotic heart disease of native coronary artery without angina pectoris: Secondary | ICD-10-CM | POA: Insufficient documentation

## 2016-09-11 DIAGNOSIS — R112 Nausea with vomiting, unspecified: Secondary | ICD-10-CM | POA: Diagnosis not present

## 2016-09-11 DIAGNOSIS — R1033 Periumbilical pain: Secondary | ICD-10-CM | POA: Insufficient documentation

## 2016-09-11 DIAGNOSIS — R0789 Other chest pain: Secondary | ICD-10-CM

## 2016-09-11 DIAGNOSIS — Z7982 Long term (current) use of aspirin: Secondary | ICD-10-CM | POA: Diagnosis not present

## 2016-09-11 LAB — CBC WITH DIFFERENTIAL/PLATELET
Basophils Absolute: 0 10*3/uL (ref 0.0–0.1)
Basophils Relative: 0 %
Eosinophils Absolute: 1.4 10*3/uL — ABNORMAL HIGH (ref 0.0–0.7)
Eosinophils Relative: 10 %
HCT: 47.4 % (ref 39.0–52.0)
Hemoglobin: 16.8 g/dL (ref 13.0–17.0)
Lymphocytes Relative: 7 %
Lymphs Abs: 0.9 10*3/uL (ref 0.7–4.0)
MCH: 30.5 pg (ref 26.0–34.0)
MCHC: 35.4 g/dL (ref 30.0–36.0)
MCV: 86.2 fL (ref 78.0–100.0)
Monocytes Absolute: 0.6 10*3/uL (ref 0.1–1.0)
Monocytes Relative: 5 %
Neutro Abs: 10.1 10*3/uL — ABNORMAL HIGH (ref 1.7–7.7)
Neutrophils Relative %: 78 %
Platelets: 131 10*3/uL — ABNORMAL LOW (ref 150–400)
RBC: 5.5 MIL/uL (ref 4.22–5.81)
RDW: 13.5 % (ref 11.5–15.5)
WBC: 13 10*3/uL — ABNORMAL HIGH (ref 4.0–10.5)

## 2016-09-11 LAB — LIPASE, BLOOD: Lipase: 31 U/L (ref 11–51)

## 2016-09-11 LAB — COMPREHENSIVE METABOLIC PANEL
ALT: 28 U/L (ref 17–63)
AST: 22 U/L (ref 15–41)
Albumin: 4.2 g/dL (ref 3.5–5.0)
Alkaline Phosphatase: 38 U/L (ref 38–126)
Anion gap: 13 (ref 5–15)
BUN: 23 mg/dL — ABNORMAL HIGH (ref 6–20)
CO2: 21 mmol/L — ABNORMAL LOW (ref 22–32)
Calcium: 9.6 mg/dL (ref 8.9–10.3)
Chloride: 102 mmol/L (ref 101–111)
Creatinine, Ser: 0.93 mg/dL (ref 0.61–1.24)
GFR calc Af Amer: >60 mL/min (ref >60)
GFR calc non Af Amer: >60 mL/min (ref >60)
Glucose, Bld: 242 mg/dL — ABNORMAL HIGH (ref 65–99)
Potassium: 3.9 mmol/L (ref 3.5–5.1)
Sodium: 136 mmol/L (ref 135–145)
Total Bilirubin: 1.3 mg/dL — ABNORMAL HIGH (ref 0.3–1.2)
Total Protein: 7.3 g/dL (ref 6.5–8.1)

## 2016-09-11 LAB — URINALYSIS, ROUTINE W REFLEX MICROSCOPIC
Bacteria, UA: NONE SEEN
Bilirubin Urine: NEGATIVE
Glucose, UA: >=500 mg/dL — AB
Hgb urine dipstick: NEGATIVE
Ketones, ur: 20 mg/dL — AB
Leukocytes, UA: NEGATIVE
Nitrite: NEGATIVE
Protein, ur: NEGATIVE mg/dL
RBC / HPF: NONE SEEN RBC/hpf (ref 0–5)
Specific Gravity, Urine: 1.024 (ref 1.005–1.030)
pH: 5 (ref 5.0–8.0)

## 2016-09-11 LAB — TROPONIN I
Troponin I: <0.03 ng/mL (ref <0.03)
Troponin I: <0.03 ng/mL (ref <0.03)

## 2016-09-11 MED ORDER — ONDANSETRON HCL 4 MG/2ML IJ SOLN
4.0000 mg | Freq: Once | INTRAMUSCULAR | Status: AC
Start: 2016-09-11 — End: 2016-09-11
  Administered 2016-09-11: 4 mg via INTRAVENOUS
  Filled 2016-09-11: qty 2

## 2016-09-11 MED ORDER — DICYCLOMINE HCL 10 MG/ML IM SOLN
20.0000 mg | Freq: Once | INTRAMUSCULAR | Status: AC
  Administered 2016-09-11: 20 mg via INTRAMUSCULAR
  Filled 2016-09-11: qty 2

## 2016-09-11 MED ORDER — ONDANSETRON HCL 4 MG PO TABS: 4.0000 mg | ORAL_TABLET | Freq: Three times a day (TID) | ORAL | 0 refills | Status: DC | PRN

## 2016-09-11 MED ORDER — IOPAMIDOL (ISOVUE-300) INJECTION 61%
INTRAVENOUS | Status: AC
  Administered 2016-09-11: 15 mL via ORAL
  Filled 2016-09-11: qty 30

## 2016-09-11 MED ORDER — DIPHENHYDRAMINE HCL 50 MG/ML IJ SOLN
50.0000 mg | Freq: Once | INTRAMUSCULAR | Status: DC
  Filled 2016-09-11: qty 1

## 2016-09-11 MED ORDER — SODIUM CHLORIDE 0.9 % IV BOLUS (SEPSIS)
500.0000 mL | Freq: Once | INTRAVENOUS | Status: AC
  Administered 2016-09-11: 500 mL via INTRAVENOUS

## 2016-09-11 MED ORDER — FENTANYL CITRATE (PF) 100 MCG/2ML IJ SOLN
50.0000 ug | Freq: Once | INTRAMUSCULAR | Status: AC
  Administered 2016-09-11: 50 ug via INTRAVENOUS
  Filled 2016-09-11: qty 2

## 2016-09-11 MED ORDER — DIPHENHYDRAMINE HCL 50 MG/ML IJ SOLN
25.0000 mg | Freq: Once | INTRAMUSCULAR | Status: AC
  Administered 2016-09-11: 25 mg via INTRAVENOUS

## 2016-09-11 MED ORDER — METOCLOPRAMIDE HCL 5 MG/ML IJ SOLN
10.0000 mg | Freq: Once | INTRAMUSCULAR | Status: AC
  Administered 2016-09-11: 10 mg via INTRAVENOUS
  Filled 2016-09-11: qty 2

## 2016-09-11 MED ORDER — ONDANSETRON HCL 4 MG/2ML IJ SOLN
4.0000 mg | Freq: Once | INTRAMUSCULAR | Status: AC
  Administered 2016-09-11: 4 mg via INTRAVENOUS
  Filled 2016-09-11: qty 2

## 2016-09-11 MED ORDER — SODIUM CHLORIDE 0.9 % IV BOLUS (SEPSIS)
1000.0000 mL | Freq: Once | INTRAVENOUS | Status: AC
  Administered 2016-09-11: 1000 mL via INTRAVENOUS

## 2016-09-11 MED ORDER — IOPAMIDOL (ISOVUE-300) INJECTION 61%
100.0000 mL | Freq: Once | INTRAVENOUS | Status: AC | PRN
  Administered 2016-09-11: 100 mL via INTRAVENOUS

## 2016-09-11 MED ORDER — CEFDINIR 300 MG PO CAPS: 300.0000 mg | ORAL_CAPSULE | Freq: Two times a day (BID) | ORAL | 0 refills | Status: DC

## 2016-09-11 NOTE — Telephone Encounter (Signed)
Med sent to pharm. Pt notified.  

## 2016-09-11 NOTE — ED Notes (Signed)
Pt offered Ginger Ale

## 2016-09-11 NOTE — Discharge Instructions (Signed)
Drink plenty of fluids (clear liquids) the next 6-12 hours then start a bland diet such as toast, crackers, jello, Campbell's chicken noodle soup. Use the zofran for nausea or vomiting. Take imodium OTC if you get diarrhea. Avoid mild products until the diarrhea is gone. Recheck if you get worse again. Call Dr Kristian Covey office to let them know about your ED visit tonight. He may want to see you sooner than your next appointment.

## 2016-09-11 NOTE — ED Notes (Addendum)
Pt initially complained of N, and heartburn after eating supper tonight around 1900. His spouse then states he is having chest pain and ha had stents placed. His Cardiologist is Financial controller, PCP is Luking  He points to mid upper stomach as pain site  He is in a sinus rhythm on the monitor States he has had a flu shot this year

## 2016-09-11 NOTE — Telephone Encounter (Signed)
omnicef 300 bid ten d

## 2016-09-11 NOTE — ED Notes (Signed)
Dr Tomi Bamberger in to assess

## 2016-09-11 NOTE — ED Notes (Signed)
Report to [place], [place]

## 2016-09-11 NOTE — Telephone Encounter (Signed)
Patient seen on 08/23/16 for acute bacterial rhinosinusitis.  He was prescribed Augmentin.  He is still having head congestion and pressure, would like another round of antibiotics called in.  [place]

## 2016-09-11 NOTE — ED Provider Notes (Signed)
[facility] DEPT Provider Note   CSN: [id number] Arrival date & time: 09/11/16  0016   By signing my name below, I, Hilbert Odor, attest that this documentation has been prepared under the direction and in the presence of Rolland Porter, MD. Electronically Signed: Hilbert Odor, Scribe. 09/11/16. 1:00 AM.  Time seen 12:50 AM  History   Chief Complaint Chief Complaint  Patient presents with  . Chest Pain    since 7 pm  . Abdominal Pain    after eating chicken salad    The history is provided by the patient and the spouse. No language interpreter was used.  Abdominal Pain      HPI Comments: Jared Griffin is a 55 y.o. male who presents to the Emergency Department complaining of indigestion that began around 7 pm on 09/10/16. He states indigestion is belching. He took ranitidine without relief. Patient also reports associated left sided chest pressure that started around 4 or 5 pm on 09/10/16. He states his chest has been hurting constantly since it started. He also has lack of energy and feels weak. He denies having shortness of breath, diaphoresis, radiation of pain. He states he's had this chest pressure before. He had a cardiac stent placed in 2015. He had a cardiac cath done earlier this year when he was having some chest pain and he states he has 2 lesions that are 40% and a valve problem. He was given nitroglycerin to use for chest pain however he does not use it. He states he's had some minor episodes of chest pain since that catheterization.. Patient reports feeling nauseous but denies vomiting. He also denies diarrhea, diaphoresis and blood in stool. He also reports stabbing pain in his lower abdomen and has  mid abdominal pain that he describes as achiness. He also states he had a headache all weekend. He denies eating anything he thinks made him sick although he did eat some chicken salad from yesterday however it had been refrigerated. Nobody else is sick that he's around.      PCP Mickie Hillier, MD Cardiology Dr Jacinta Shoe  Past Medical History:  Diagnosis Date  . Arthritis   . 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 Coronary atherosclerosis of native coronary artery    a. 12/09/2013: s/p PCI with 3.0 x 28 Promus DES to mLAD  . Essential hypertension, benign   . Fatty liver disease, nonalcoholic   . Mixed hyperlipidemia 2006  . Obesity   . Osgood-Schlatter's disease of right knee   . Type 2 diabetes mellitus [facility])     Patient Active Problem List   Diagnosis Date Noted  . Cervical nerve root impingement 12/09/2015  . Generalized anxiety disorder 09/27/2015  . Chest pain 09/20/2015  . Pain in the chest   . Depression 01/24/2015  . Esophageal reflux 01/24/2015  . Preoperative cardiovascular examination 06/28/2014  . Type 2 diabetes mellitus ([place])   . Mixed hyperlipidemia   . Cirrhosis of liver without mention of alcohol   . Fatty liver disease, nonalcoholic   . Obesity   . Intermediate coronary syndrome ([place]) 12/09/2013  . Fatty liver 05/05/2013  . Lumbago 05/21/2012  . Essential hypertension, benign 12/28/2009  . CAD S/P LAD DES March 2015 12/28/2009  . Hyperlipidemia LDL goal <70 11/25/2009  . Accelerating angina ([place]) 11/25/2009  . DM 11/24/2009  . ABDOMINAL PAIN, HX OF 11/24/2009    Past Surgical History:  Procedure Laterality Date  . CARDIAC CATHETERIZATION  2011  . CARDIAC  CATHETERIZATION N/A 09/20/2015   Procedure: Left Heart Cath and Coronary Angiography;  Surgeon: [name], MD;  Location: [place] CV LAB;  Service: Cardiovascular;  Laterality: N/A;  . CHOLECYSTECTOMY  2003  . COLONOSCOPY  06/19/2012   Procedure: COLONOSCOPY;  Surgeon: [name], MD;  Location[facility];  Service: Endoscopy;  Laterality: N/A;  930  . KNEE ARTHROPLASTY Right 1999  . LEFT HEART CATHETERIZATION WITH CORONARY ANGIOGRAM N/A 12/09/2013   Procedure: LEFT HEART CATHETERIZATION WITH CORONARY ANGIOGRAM;  Surgeon:  [name], MD;  Location: [facility] CATH LAB;  Service: Cardiovascular;  Laterality: N/A;  . Liver biopsy    . TONSILLECTOMY  1970's       Home Medications    Prior to Admission medications   Medication Sig Start Date End Date Taking? Authorizing Provider  acetaminophen (TYLENOL) 500 MG tablet Take 1,000 mg by mouth daily as needed for headache. Pain.    Historical Provider, MD  ALPRAZolam Duanne Moron) 0.5 MG tablet Take 1 tablet three times a day as needed 07/26/16   Cloria Spring, MD  amoxicillin-clavulanate (AUGMENTIN) 875-125 MG tablet Take 1 tablet by mouth 2 (two) times daily. 08/23/16   [name], MD  aspirin (ASPIR-LOW) 81 MG EC tablet Take 81 mg by mouth daily.     Historical Provider, MD  atorvastatin (LIPITOR) 40 MG tablet TAKE ONE (1) TABLET BY MOUTH EVERY DAY 02/27/16   Herminio Commons, MD  canagliflozin (INVOKANA) 300 MG TABS tablet TAKE ONE (1) TABLET EACH DAY BEFORE BREAKFAST 05/01/16   Mikey Kirschner, MD  diltiazem (CARDIZEM) 30 MG tablet Take 1 tablet (30 mg total) by mouth 2 (two) times daily. 05/01/16   Mikey Kirschner, MD  enalapril (VASOTEC) 5 MG tablet Take 1 tablet (5 mg total) by mouth daily. 04/23/16   Herminio Commons, MD  gabapentin (NEURONTIN) 300 MG capsule Take 300 mg by mouth 2 (two) times daily.    Historical Provider, MD  gabapentin (NEURONTIN) 300 MG capsule TAKE ONE (1) CAPSULE FOUR (4) TIMES EACH DAY 08/08/16   Mikey Kirschner, MD  glipiZIDE (GLUCOTROL) 5 MG tablet Take 2 tablets by mouth twice a daily. 05/01/16   Mikey Kirschner, MD  HYDROcodone-acetaminophen (NORCO) 7.5-325 MG tablet Take 1 tablet by mouth every 4 (four) hours as needed for moderate pain. 04/04/16   [name], MD  HYDROcodone-homatropine (HYCODAN) 5-1.5 MG/5ML syrup Take 5 mLs by mouth every 4 (four) hours as needed. 05/30/16   Nilda Simmer, NP  metFORMIN (GLUCOPHAGE) 500 MG tablet TAKE TWO TABLET EVERY MORNING, THEN ONE TABLET AT NOON, THEN TWO TABLETS EVERY EVENING  05/01/16   Mikey Kirschner, MD  nitroGLYCERIN (NITROSTAT) 0.4 MG SL tablet Place 1 tablet (0.4 mg total) under the tongue every 5 (five) minutes as needed for chest pain. 03/26/16   Herminio Commons, MD  ondansetron (ZOFRAN) 4 MG tablet Take 1 tablet (4 mg total) by mouth every 8 (eight) hours as needed for nausea or vomiting. 09/11/16   Rolland Porter, MD  pantoprazole (PROTONIX) 40 MG tablet TAKE ONE TABLET BY MOUTH ONCE DAILY. 08/22/16   Mikey Kirschner, MD  ranitidine (ZANTAC) 150 MG tablet Take 150 mg by mouth daily as needed for heartburn.    Historical Provider, MD  ranolazine (RANEXA) 500 MG 12 hr tablet Take 1 tablet (500 mg total) by mouth 2 (two) times daily. 04/23/16   Herminio Commons, MD  sertraline (ZOLOFT) 100 MG  tablet Take 1 tablet (100 mg total) by mouth daily. 07/26/16 07/26/17  Cloria Spring, MD    Family History Family History  Problem Relation Age of Onset  . Cancer Maternal Aunt   . CVA Maternal Grandmother   . CVA Maternal Grandfather     Social History Social History  Substance Use Topics  . Smoking status: Former Smoker    Packs/day: 0.50    Years: 10.00    Types: Cigarettes    Start date: 02/02/1973    Quit date: 09/17/1983  . Smokeless tobacco: Former Systems developer    Types: [place] date: 09/17/1983  . Alcohol use No     Comment: 11-30-15 per pt no  employed Lives with spouse   Allergies   Metoprolol   Review of Systems Review of Systems  Gastrointestinal: Positive for abdominal pain.  All other systems reviewed and are negative.  A complete 10 system review of systems was obtained and all systems are negative except as noted in the HPI and PMH.   Physical Exam Updated Vital Signs BP 135/88 (BP Location: Left Arm)   Pulse 89   Temp 97.8 F (36.6 C) (Oral)   Resp 16   Ht 6' 2"  (1.88 m)   Wt 235 lb (106.6 kg)   SpO2 98%   BMI 30.17 kg/m   Vital signs normal    Physical Exam  Constitutional: He is oriented to person, place, and time. He  appears well-developed and well-nourished.  Non-toxic appearance. He does not appear ill. No distress.  HENT:  Head: Normocephalic and atraumatic.  Right Ear: External ear normal.  Left Ear: External ear normal.  Nose: Nose normal. No mucosal edema or rhinorrhea.  Mouth/Throat: Oropharynx is clear and moist and mucous membranes are normal. No dental abscesses or uvula swelling.  Eyes: Conjunctivae and EOM are normal. Pupils are equal, round, and reactive to light.  Neck: Normal range of motion and full passive range of motion without pain. Neck supple.  Cardiovascular: Normal rate, regular rhythm and normal heart sounds.  Exam reveals no gallop and no friction rub.   No murmur heard. Pulmonary/Chest: Effort normal and breath sounds normal. No respiratory distress. He has no wheezes. He has no rhonchi. He has no rales. He exhibits no tenderness and no crepitus.  Abdominal: Soft. Normal appearance and bowel sounds are normal. He exhibits no distension. There is no rebound and no guarding.    Abdomen is tender, LLQ and suprapubic   Musculoskeletal: Normal range of motion. He exhibits no edema or tenderness.  Moves all extremities well.   Neurological: He is alert and oriented to person, place, and time. He has normal strength. No cranial nerve deficit.  Skin: Skin is warm, dry and intact. No rash noted. No erythema. No pallor.  Psychiatric: He has a normal mood and affect. His speech is normal and behavior is normal. His mood appears not anxious.  Nursing note and vitals reviewed.    ED Treatments / Results  DIAGNOSTIC STUDIES: Oxygen Saturation is 98% on RA, normal by my interpretation.      Labs (all labs ordered are listed, but only abnormal results are displayed) Results for orders placed or performed during the hospital encounter of 09/11/16  Comprehensive metabolic panel  Result Value Ref Range   Sodium 136 135 - 145 mmol/L   Potassium 3.9 3.5 - 5.1 mmol/L   Chloride 102 101  - 111 mmol/L   CO2 21 (L) 22 - 32  mmol/L   Glucose, Bld 242 (H) 65 - 99 mg/dL   BUN 23 (H) 6 - 20 mg/dL   Creatinine, Ser 0.93 0.61 - 1.24 mg/dL   Calcium 9.6 8.9 - 10.3 mg/dL   Total Protein 7.3 6.5 - 8.1 g/dL   Albumin 4.2 3.5 - 5.0 g/dL   AST 22 15 - 41 U/L   ALT 28 17 - 63 U/L   Alkaline Phosphatase 38 38 - 126 U/L   Total Bilirubin 1.3 (H) 0.3 - 1.2 mg/dL   GFR calc non Af [name] >60 >60 mL/min   GFR calc Af [name] >60 >60 mL/min   Anion gap 13 5 - 15  Lipase, blood  Result Value Ref Range   Lipase 31 11 - 51 U/L  Troponin I  Result Value Ref Range   Troponin I <0.03 <0.03 ng/mL  CBC with Differential  Result Value Ref Range   WBC 13.0 (H) 4.0 - 10.5 K/uL   RBC 5.50 4.22 - 5.81 MIL/uL   Hemoglobin 16.8 13.0 - 17.0 g/dL   HCT 47.4 39.0 - 52.0 %   MCV 86.2 78.0 - 100.0 fL   MCH 30.5 26.0 - 34.0 pg   MCHC 35.4 30.0 - 36.0 g/dL   RDW 13.5 11.5 - 15.5 %   Platelets 131 (L) 150 - 400 K/uL   Neutrophils Relative % 78 %   Neutro Abs 10.1 (H) 1.7 - 7.7 K/uL   Lymphocytes Relative 7 %   Lymphs Abs 0.9 0.7 - 4.0 K/uL   Monocytes Relative 5 %   Monocytes Absolute 0.6 0.1 - 1.0 K/uL   Eosinophils Relative 10 %   Eosinophils Absolute 1.4 (H) 0.0 - 0.7 K/uL   Basophils Relative 0 %   Basophils Absolute 0.0 0.0 - 0.1 K/uL  Urinalysis, Routine w reflex microscopic  Result Value Ref Range   Color, Urine YELLOW YELLOW   APPearance CLEAR CLEAR   Specific Gravity, Urine 1.024 1.005 - 1.030   pH 5.0 5.0 - 8.0   Glucose, UA >=500 (A) NEGATIVE mg/dL   Hgb urine dipstick NEGATIVE NEGATIVE   Bilirubin Urine NEGATIVE NEGATIVE   Ketones, ur 20 (A) NEGATIVE mg/dL   Protein, ur NEGATIVE NEGATIVE mg/dL   Nitrite NEGATIVE NEGATIVE   Leukocytes, UA NEGATIVE NEGATIVE   RBC / HPF NONE SEEN 0 - 5 RBC/hpf   WBC, UA 0-5 0 - 5 WBC/hpf   Bacteria, UA NONE SEEN NONE SEEN  Troponin I  Result Value Ref Range   Troponin I <0.03 <0.03 ng/mL   Laboratory interpretation all normal except  Hyperglycemia and leukocytosis    EKG  EKG Interpretation  Date/Time:  Tuesday September 11 2016 00:35:51 EST Ventricular Rate:  93 PR Interval:    QRS Duration: 72 QT Interval:  334 QTC Calculation: 416 R Axis:   52 Text Interpretation:  Sinus rhythm Left atrial enlargement Low voltage, precordial leads Baseline wander in lead(s) V4 V5 No significant change since last tracing 20 Sep 2015 Confirmed by Marcelina Mclaurin  MD-I, Linkin Vizzini ([id number]) on 09/11/2016 12:46:33 AM       Radiology Ct Abdomen Pelvis W Contrast  Result Date: 09/11/2016 CLINICAL DATA:  Left lower quadrant abdominal pain.  Nausea. EXAM: CT ABDOMEN AND PELVIS WITH CONTRAST TECHNIQUE: Multidetector CT imaging of the abdomen and pelvis was performed using the standard protocol following bolus administration of intravenous contrast. CONTRAST:  154m ISOVUE-300 IOPAMIDOL (ISOVUE-300) INJECTION 61% COMPARISON:  Renal ultrasound 06/09/2012.  Noncontrast CT 11/09/2003 FINDINGS: Lower chest: Mild  lingular atelectasis. Coronary stent in place. No pleural fluid. Hepatobiliary: The liver is enlarged spanning 26 cm cranial caudal. Within segment 5/6 is a 15 mm hypodense lesion that is less conspicuous on delayed phase imaging. This is not a simple cyst. Postcholecystectomy without biliary dilatation. Pancreas: No ductal dilatation or inflammation. Spleen: Enlarged measuring 15.5 cm cranial caudal. No focal lesion. Splenule inferiorly. Adrenals/Urinary Tract: No adrenal nodule. Cortical scarring with volume loss involving the anterolateral left kidney. No hydronephrosis or perinephric edema of either kidney. Symmetric excretion on delayed phase imaging. Urinary bladder is unremarkable. Stomach/Bowel: The stomach is distended. No gastric wall thickening. No small bowel distention or inflammation. Normal appendix. Moderate stool burden throughout the entire colon. No colonic inflammation. No significant diverticular disease. Vascular/Lymphatic: Aortic  atherosclerosis. No enlarged abdominal or pelvic lymph nodes. Reproductive: Prostate is unremarkable. Other: Fat within both inguinal canals without bowel involvement. No free air or intra-abdominal ascites. Tiny fat containing umbilical hernia. Musculoskeletal: Hemi transitional lumbosacral anatomy. Unilateral right pars defect of the transitional lumbosacral vertebra. There are no acute or suspicious osseous abnormalities. IMPRESSION: 1. Gastric distension, which can be seen in the setting of gastroparesis. No focal inflammation or gastric outlet obstruction. 2. Left renal scarring. 3. Right lobe hepatic lesion is unchanged from CT dating back to 2005, likely a hemangioma. 4. Hepatosplenomegaly. 5. Abdominal atherosclerosis. Electronically Signed   By: Jeb Levering M.D.   On: 09/11/2016 03:09    Procedures Procedures (including critical care time)  Medications Ordered in ED Medications  sodium chloride 0.9 % bolus 1,000 mL (1,000 mLs Intravenous New Bag/Given 09/11/16 0114)  sodium chloride 0.9 % bolus 500 mL (500 mLs Intravenous New Bag/Given 09/11/16 0317)  fentaNYL (SUBLIMAZE) injection 50 mcg (50 mcg Intravenous Given 09/11/16 0115)  ondansetron (ZOFRAN) injection 4 mg (4 mg Intravenous Given 09/11/16 0114)  fentaNYL (SUBLIMAZE) injection 50 mcg (50 mcg Intravenous Given 09/11/16 0317)  metoCLOPramide (REGLAN) injection 10 mg (10 mg Intravenous Given 09/11/16 0318)  iopamidol (ISOVUE-300) 61 % injection (15 mLs Oral Contrast Given 09/11/16 0226)  iopamidol (ISOVUE-300) 61 % injection 100 mL (100 mLs Intravenous Contrast Given 09/11/16 0225)  diphenhydrAMINE (BENADRYL) injection 25 mg (25 mg Intravenous Given 09/11/16 0318)  dicyclomine (BENTYL) injection 20 mg (20 mg Intramuscular Given 09/11/16 0359)  ondansetron (ZOFRAN) injection 4 mg (4 mg Intravenous Given 09/11/16 0359)     Initial Impression / Assessment and Plan / ED Course  I have reviewed the triage vital signs and the  nursing notes.  Pertinent labs & imaging results that were available during my care of the patient were reviewed by me and considered in my medical decision making (see chart for details).  Clinical Course   COORDINATION OF CARE: 1:00 AM Discussed treatment plan with pt at bedside and pt agreed to plan.  02:05 AM recheck  Chest pressure much better, still has some nausea and abdominal pain. AP CT ordered.  3:15 AM patient just vomited. He states he still has some nausea. We discussed the CT results.  Recheck 04:40 AM Patient states his nausea is gone, his chest pain is gone, and his abdominal pain is gone. He's willing to try oral fluids.  Recheck at 05:20 AM pt has been drinking fluids, doesn't feel worse. States he feels ready to be discharged. We discussed f/u with Dr Jacinta Shoe.    Cardiac Catheterization [name], MD 09/20/2015 Routine  Narrative & Impression    1. Patent stent mid LAD with no restenosis.  2. Mild to moderate  mid LAD stenosis beyond the stent, does not appear to be flow limiting.  3. No obstructive disease in the Circumflex or RCA 4. Normal LV systolic function  Recommendations: Continue medical management of CAD. Agree with starting PPI for possible GERD. Will check D-dimer to exclude PE.     Echo Sep 21 2015 History:   PMH:  Patient with ongoing chest pain, cath with patent LAD stent. Exclude pericardial effusion.  Coronary artery disease. Risk factors:  Current tobacco use. Hypertension. Diabetes mellitus. Dyslipidemia.  ------------------------------------------------------------------- Study Conclusions  - Left ventricle: The cavity size was normal. Wall thickness was   increased in a pattern of mild LVH. Systolic function was normal.   The estimated ejection fraction was in the range of 55% to 60%.   Wall motion was normal; there were no regional wall motion   abnormalities. Left ventricular diastolic function parameters   were  normal. - Mitral valve: There was mild regurgitation.   Cardiac Cath December 09, 2013 IMPRESSIONS:  1. Normal left main coronary artery. 2. Moderate to severe stenosis in the mid left anterior descending artery  Which was significant by FFR. This was successfully stented with a 3.0 x 28 Promus drug-eluting stent, postdilated to 3.6 mm in diameter.  Of note, the JL4 guiding catheter fit well from the right radial approach.  3. Normal left circumflex artery and its branches. 4. Normal right coronary artery. 5. Normal left ventricular systolic function.  LVEDP  6 mmHg.  Ejection fraction 55 %.   Final Clinical Impressions(s) / ED Diagnoses   Final diagnoses:  Periumbilical abdominal pain  Non-intractable vomiting with nausea, unspecified vomiting type  Chest pressure    New Prescriptions New Prescriptions   ONDANSETRON (ZOFRAN) 4 MG TABLET    Take 1 tablet (4 mg total) by mouth every 8 (eight) hours as needed for nausea or vomiting.   Plan discharge  Rolland Porter, MD, FACEP   I personally performed the services described in this documentation, which was scribed in my presence. The recorded information has been reviewed and considered.  Rolland Porter, MD, Barbette Or, MD 09/11/16 [phone]

## 2016-09-11 NOTE — ED Triage Notes (Signed)
Initially complained of epigastric pain, hen wife reported CPwith history of stents

## 2016-09-18 ENCOUNTER — Ambulatory Visit (INDEPENDENT_AMBULATORY_CARE_PROVIDER_SITE_OTHER): Payer: PRIVATE HEALTH INSURANCE | Admitting: Cardiovascular Disease

## 2016-09-18 ENCOUNTER — Other Ambulatory Visit: Payer: Self-pay | Admitting: Family Medicine

## 2016-09-18 ENCOUNTER — Encounter: Payer: Self-pay | Admitting: Cardiovascular Disease

## 2016-09-18 VITALS — BP 112/72 | HR 82 | Ht 74.0 in | Wt 232.0 lb

## 2016-09-18 DIAGNOSIS — E782 Mixed hyperlipidemia: Secondary | ICD-10-CM | POA: Diagnosis not present

## 2016-09-18 DIAGNOSIS — R0609 Other forms of dyspnea: Secondary | ICD-10-CM

## 2016-09-18 DIAGNOSIS — Z9289 Personal history of other medical treatment: Secondary | ICD-10-CM

## 2016-09-18 DIAGNOSIS — I25728 Atherosclerosis of autologous artery coronary artery bypass graft(s) with other forms of angina pectoris: Secondary | ICD-10-CM | POA: Diagnosis not present

## 2016-09-18 DIAGNOSIS — R05 Cough: Secondary | ICD-10-CM

## 2016-09-18 DIAGNOSIS — R058 Other specified cough: Secondary | ICD-10-CM

## 2016-09-18 DIAGNOSIS — I10 Essential (primary) hypertension: Secondary | ICD-10-CM

## 2016-09-18 DIAGNOSIS — R0600 Dyspnea, unspecified: Secondary | ICD-10-CM

## 2016-09-18 MED ORDER — LOSARTAN POTASSIUM 25 MG PO TABS: 25.0000 mg | ORAL_TABLET | Freq: Every day | ORAL | 11 refills | Status: DC

## 2016-09-18 NOTE — Progress Notes (Signed)
SUBJECTIVE: The patient presents for follow-up after being evaluated in the ED for chest pain, nausea, vomiting, and abdominal pain. Troponins and lipase were normal. White blood cells were elevated.  Abdominal CT showed gastric distention and hepatosplenomegaly.  ECG showed normal sinus rhythm with no ischemic ST segment or T-wave abnormalities.  He has a h/o CAD with LAD PCI in 11/2013, hypertension, hyperlipidemia, diabetes, and GERD.   Coronary angiography on 09/19/14 showed a patent stent in the mid LAD with mild to moderate mid LAD stenosis of 40% beyond the stent but which was not flow limiting. The circumflex and RCA were normal.  He still has some mild nausea but has felt better in the past 2 days. He denies exertional chest pain and rarely has exertional dyspnea.  He has developed a dry cough bothering him primarily at night. It is nonproductive. He wonders if it is due to one of his medications.   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 Married to Roselle, also my patient.   Review of Systems: As per "subjective", otherwise negative.  Allergies  Allergen Reactions  . Metoprolol Rash    Current Outpatient Prescriptions  Medication Sig Dispense Refill  . ACCU-CHEK AVIVA PLUS test strip CHECK BLOOD SUGAR UP TO FOUR TIMES A DAY. 100 each 5  . acetaminophen (TYLENOL) 500 MG tablet Take 1,000 mg by mouth daily as needed for headache. Pain.    Marland Kitchen ALPRAZolam (XANAX) 0.5 MG tablet Take 1 tablet three times a day as needed 90 tablet 2  . amoxicillin-clavulanate (AUGMENTIN) 875-125 MG tablet Take 1 tablet by mouth 2 (two) times daily. 20 tablet 0  . aspirin (ASPIR-LOW) 81 MG EC tablet Take 81 mg by mouth daily.     Marland Kitchen atorvastatin (LIPITOR) 40 MG tablet TAKE ONE (1) TABLET BY MOUTH EVERY DAY 30 tablet 6  . canagliflozin (INVOKANA) 300 MG TABS tablet TAKE ONE (1) TABLET EACH DAY BEFORE BREAKFAST 30 tablet 5  .  cefdinir (OMNICEF) 300 MG capsule Take 1 capsule (300 mg total) by mouth 2 (two) times daily. 20 capsule 0  . diltiazem (CARDIZEM) 30 MG tablet Take 1 tablet (30 mg total) by mouth 2 (two) times daily. 60 tablet 5  . enalapril (VASOTEC) 5 MG tablet Take 1 tablet (5 mg total) by mouth daily. 30 tablet 6  . gabapentin (NEURONTIN) 300 MG capsule Take 300 mg by mouth 2 (two) times daily.    Marland Kitchen gabapentin (NEURONTIN) 300 MG capsule TAKE ONE (1) CAPSULE FOUR (4) TIMES EACH DAY 120 capsule 1  . glipiZIDE (GLUCOTROL) 5 MG tablet Take 2 tablets by mouth twice a daily. 120 tablet 5  . HYDROcodone-acetaminophen (NORCO) 7.5-325 MG tablet Take 1 tablet by mouth every 4 (four) hours as needed for moderate pain. 30 tablet 0  . HYDROcodone-homatropine (HYCODAN) 5-1.5 MG/5ML syrup Take 5 mLs by mouth every 4 (four) hours as needed. 90 mL 0  . metFORMIN (GLUCOPHAGE) 500 MG tablet TAKE TWO TABLET EVERY MORNING, THEN ONE TABLET AT NOON, THEN TWO TABLETS EVERY EVENING 450 tablet 1  . nitroGLYCERIN (NITROSTAT) 0.4 MG SL tablet Place 1 tablet (0.4 mg total) under the tongue every 5 (five) minutes as needed for chest pain. 25 tablet 3  . ondansetron (ZOFRAN) 4 MG tablet Take 1 tablet (4 mg total) by mouth every 8 (eight) hours as needed for nausea or vomiting. 8 tablet 0  . pantoprazole (PROTONIX) 40  MG tablet TAKE ONE TABLET BY MOUTH ONCE DAILY. 30 tablet 2  . ranitidine (ZANTAC) 150 MG tablet Take 150 mg by mouth daily as needed for heartburn.    . ranolazine (RANEXA) 500 MG 12 hr tablet Take 1 tablet (500 mg total) by mouth 2 (two) times daily. 60 tablet 6  . sertraline (ZOLOFT) 100 MG tablet Take 1 tablet (100 mg total) by mouth daily. 30 tablet 2   No current facility-administered medications for this visit.     Past Medical History:  Diagnosis Date  . Arthritis   . 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 Coronary atherosclerosis of native coronary artery    a. 12/09/2013: s/p PCI with 3.0 x 28  Promus DES to mLAD  . Essential hypertension, benign   . Fatty liver disease, nonalcoholic   . Mixed hyperlipidemia 2006  . Obesity   . Osgood-Schlatter's disease of right knee   . Type 2 diabetes mellitus ([place])     Past Surgical History:  Procedure Laterality Date  . CARDIAC CATHETERIZATION  2011  . CARDIAC CATHETERIZATION N/A 09/20/2015   Procedure: Left Heart Cath and Coronary Angiography;  Surgeon: [name], MD;  Location: [facility];  Service: Cardiovascular;  Laterality: N/A;  . CHOLECYSTECTOMY  2003  . COLONOSCOPY  06/19/2012   Procedure: COLONOSCOPY;  Surgeon: [name], MD;  [facility];  Service: Endoscopy;  Laterality: N/A;  930  . KNEE ARTHROPLASTY Right 1999  . LEFT HEART CATHETERIZATION WITH CORONARY ANGIOGRAM N/A 12/09/2013   Procedure: LEFT HEART CATHETERIZATION WITH CORONARY ANGIOGRAM;  Surgeon: [name], MD;  [facility];  Service: Cardiovascular;  Laterality: N/A;  . Liver biopsy    . TONSILLECTOMY  1970's    Social History   Social History  . Marital status: Married    Spouse name: N/A  . Number of children: N/A  . Years of education: N/A   Occupational History  . Not on file.   Social History Main Topics  . Smoking status: Former Smoker    Packs/day: 0.50    Years: 10.00    Types: Cigarettes    Start date: 02/02/1973    Quit date: 09/17/1983  . Smokeless tobacco: Former Systems developer    Types: [place] date: 09/17/1983  . Alcohol use No     Comment: 11-30-15 per pt no  . Drug use: No     Comment: 11-30-15 per pt no  . Sexual activity: Yes   Other Topics Concern  . 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    Married with 1 child   Right handed    12 th    Very little caffeine     Vitals:   09/18/16 1618  BP: 112/72  Pulse: 82  SpO2: 92%  Weight: 232 lb (105.2 kg)  Height: 6' 2"  (1.88 m)     PHYSICAL EXAM General: NAD HEENT: Normal. Neck: No JVD, no thyromegaly. Lungs: Clear to auscultation bilaterally with normal respiratory effort. CV: Nondisplaced PMI.  Regular rate and rhythm, normal S1/S2, no S3/S4, no murmur. No pretibial or periankle edema.   Abdomen: Soft, nontender, no distention.  Neurologic: Alert and oriented.  Psych: Normal affect. Skin: Normal. Musculoskeletal: No gross deformities.    ECG: Most recent ECG reviewed.      ASSESSMENT AND PLAN: 1. CAD  and LAD stent in 11/2013: Stent was patent in 2016. Has 40% mid LAD stenosis. Continue aspirin, Lipitor, and Ranexa 500 mg bid.  2. Essential HTN: Normal. However, due to the development of a dry cough which may be due to enalapril, I will switch to losartan 25 mg daily.  3. Hyperlipidemia: On Lipitor 40 mg. Dietary and exercise counseling previously provided. No changes to medical therapy.  4. Cough: Possible ACEI-related. I will switch to losartan 25 mg daily.  Dispo: fu 1 yr   Kate Sable, M.D., F.A.C.C.

## 2016-09-18 NOTE — Patient Instructions (Signed)
Medication Instructions:   Stop Enalapril.  Begin Losartan 88m daily.  Continue all other medications.    Labwork: none  Testing/Procedures: none  Follow-Up: Your physician wants you to follow up in:  1 year.  You will receive a reminder letter in the mail one-two months in advance.  If you don't receive a letter, please call our office to schedule the follow up appointment   Any Other Special Instructions Will Be Listed Below (If Applicable).  If you need a refill on your cardiac medications before your next appointment, please call your pharmacy.

## 2016-10-09 ENCOUNTER — Other Ambulatory Visit: Payer: Self-pay | Admitting: Cardiovascular Disease

## 2016-10-15 ENCOUNTER — Other Ambulatory Visit: Payer: Self-pay | Admitting: Family Medicine

## 2016-10-26 ENCOUNTER — Ambulatory Visit (HOSPITAL_COMMUNITY): Payer: Self-pay | Admitting: Psychiatry

## 2016-10-29 LAB — HM DIABETES EYE EXAM

## 2016-11-01 ENCOUNTER — Ambulatory Visit: Payer: PRIVATE HEALTH INSURANCE | Admitting: Family Medicine

## 2016-11-02 ENCOUNTER — Encounter: Payer: Self-pay | Admitting: *Deleted

## 2016-11-02 ENCOUNTER — Other Ambulatory Visit: Payer: Self-pay | Admitting: Family Medicine

## 2016-11-13 ENCOUNTER — Other Ambulatory Visit: Payer: Self-pay | Admitting: Family Medicine

## 2016-11-22 ENCOUNTER — Ambulatory Visit (INDEPENDENT_AMBULATORY_CARE_PROVIDER_SITE_OTHER): Payer: PRIVATE HEALTH INSURANCE | Admitting: Family Medicine

## 2016-11-22 ENCOUNTER — Encounter: Payer: Self-pay | Admitting: Family Medicine

## 2016-11-22 VITALS — BP 128/84 | Ht 74.0 in | Wt 239.0 lb

## 2016-11-22 DIAGNOSIS — E119 Type 2 diabetes mellitus without complications: Secondary | ICD-10-CM | POA: Diagnosis not present

## 2016-11-22 DIAGNOSIS — K760 Fatty (change of) liver, not elsewhere classified: Secondary | ICD-10-CM | POA: Diagnosis not present

## 2016-11-22 DIAGNOSIS — I10 Essential (primary) hypertension: Secondary | ICD-10-CM

## 2016-11-22 DIAGNOSIS — E7800 Pure hypercholesterolemia, unspecified: Secondary | ICD-10-CM | POA: Diagnosis not present

## 2016-11-22 LAB — POCT GLYCOSYLATED HEMOGLOBIN (HGB A1C): Hemoglobin A1C: 6.4

## 2016-11-22 MED ORDER — GLIPIZIDE 5 MG PO TABS: ORAL_TABLET | ORAL | 5 refills | Status: DC

## 2016-11-22 MED ORDER — CANAGLIFLOZIN 300 MG PO TABS: ORAL_TABLET | ORAL | 5 refills | Status: DC

## 2016-11-22 MED ORDER — METFORMIN HCL 500 MG PO TABS: ORAL_TABLET | ORAL | 1 refills | Status: DC

## 2016-11-22 MED ORDER — DILTIAZEM HCL 30 MG PO TABS: 30.0000 mg | ORAL_TABLET | Freq: Two times a day (BID) | ORAL | 5 refills | Status: DC

## 2016-11-22 MED ORDER — HYDROCODONE-ACETAMINOPHEN 7.5-325 MG PO TABS: 1.0000 | ORAL_TABLET | ORAL | 0 refills | Status: DC | PRN

## 2016-11-22 NOTE — Progress Notes (Signed)
   Subjective:    Patient ID: Jared Griffin, male    DOB: [birth year], 56 y.o.   MRN: [id number]  Diabetes  He presents for his follow-up diabetic visit. He has type 2 diabetes mellitus. Risk factors for coronary artery disease include dyslipidemia and hypertension. Current diabetic treatment includes oral agent (triple therapy). He is compliant with treatment all of the time. His weight is stable. He is following a diabetic diet. He has not had a previous visit with a dietitian. He does not see a podiatrist.Eye exam is current.   Results for orders placed or performed in visit on 11/22/16  POCT glycosylated hemoglobin (Hb A1C)  Result Value Ref Range   Hemoglobin A1C 6.4    Blood pressure medicine and blood pressure levels reviewed today with patient. Compliant with blood pressure medicine. States does not miss a dose. No obvious side effects. Blood pressure generally good when checked elsewhere. Watching salt intake.   Patient continues to take lipid medication regularly. No obvious side effects from it. Generally does not miss a dose. Prior blood work results are reviewed with patient. Patient continues to work on fat intake in diet  Glu were 120 to 130  Now rising some at 140 to 180   also patient has history of ntermitent evee neck pain. Uses hydrocodone vey sparingly. Ges it other source would like to have a refil on thismeicine Review of Systems No headache, no major weight loss or weight gain, no chest pain no back pain abdominal pain no change in bowel habits complete ROS otherwise negative     Objective:   Physical Exam Alert vitals stable, NAD. Blood pressure good on repeat. HEENT normal. Lungs clear. Heart regular rate and rhythm.  feet sensation intact pulses good       Assessment & Plan:   impression 1 type 2 diabetes god control discussed maintin same approach #2 hypertension good control discussed ainain ame #3 hyperlipidemia ongoing time to chec bood wok #4 history  of an ASH. Will evalute liver enes 35 chnic pain with ne for rare narcotic  willpescribe today detexercie scussed further recommendationsbase on blo wor WSL

## 2016-11-25 LAB — HEPATIC FUNCTION PANEL
ALT: 26 IU/L (ref 0–44)
AST: 21 IU/L (ref 0–40)
Albumin: 4.7 g/dL (ref 3.5–5.5)
Alkaline Phosphatase: 43 IU/L (ref 39–117)
Bilirubin Total: 0.9 mg/dL (ref 0.0–1.2)
Bilirubin, Direct: 0.25 mg/dL (ref 0.00–0.40)
Total Protein: 7.2 g/dL (ref 6.0–8.5)

## 2016-11-25 LAB — LIPID PANEL
Chol/HDL Ratio: 4.3 ratio units (ref 0.0–5.0)
Cholesterol, Total: 138 mg/dL (ref 100–199)
HDL: 32 mg/dL — ABNORMAL LOW (ref >39)
LDL Calculated: 70 mg/dL (ref 0–99)
Triglycerides: 179 mg/dL — ABNORMAL HIGH (ref 0–149)
VLDL Cholesterol Cal: 36 mg/dL (ref 5–40)

## 2016-12-02 ENCOUNTER — Encounter: Payer: Self-pay | Admitting: Family Medicine

## 2016-12-11 ENCOUNTER — Other Ambulatory Visit: Payer: Self-pay | Admitting: Family Medicine

## 2016-12-11 ENCOUNTER — Telehealth (HOSPITAL_COMMUNITY): Payer: Self-pay | Admitting: *Deleted

## 2016-12-11 NOTE — Telephone Encounter (Signed)
No refills until appt scheduled

## 2016-12-11 NOTE — Telephone Encounter (Signed)
Pt pharmacy requesting refills for pt Zoloft 100 mg QD. Pt last appt with provider was 07-26-2016 and medication was refills at that time. Pt f/u appt was scheduled for 10-26-2016 and pt no showed. Pt do not have scheduled f/u appt with provider. Pharmacy number is [phone].

## 2016-12-11 NOTE — Telephone Encounter (Signed)
Called pt due to previous message on file. Staff spoke with pt and an appt was made for 12-13-2016. Pt verbalized understanding about coming to appt.

## 2016-12-13 ENCOUNTER — Encounter (HOSPITAL_COMMUNITY): Payer: Self-pay | Admitting: Psychiatry

## 2016-12-13 ENCOUNTER — Other Ambulatory Visit (HOSPITAL_COMMUNITY): Payer: Self-pay | Admitting: *Deleted

## 2016-12-13 ENCOUNTER — Ambulatory Visit (INDEPENDENT_AMBULATORY_CARE_PROVIDER_SITE_OTHER): Payer: PRIVATE HEALTH INSURANCE | Admitting: Psychiatry

## 2016-12-13 VITALS — BP 116/77 | HR 83 | Resp 18 | Wt 235.8 lb

## 2016-12-13 DIAGNOSIS — Z79899 Other long term (current) drug therapy: Secondary | ICD-10-CM | POA: Diagnosis not present

## 2016-12-13 DIAGNOSIS — Z87891 Personal history of nicotine dependence: Secondary | ICD-10-CM | POA: Diagnosis not present

## 2016-12-13 DIAGNOSIS — F321 Major depressive disorder, single episode, moderate: Secondary | ICD-10-CM

## 2016-12-13 DIAGNOSIS — Z7982 Long term (current) use of aspirin: Secondary | ICD-10-CM | POA: Diagnosis not present

## 2016-12-13 MED ORDER — SERTRALINE HCL 100 MG PO TABS: 100.0000 mg | ORAL_TABLET | Freq: Every day | ORAL | 2 refills | Status: DC

## 2016-12-13 MED ORDER — ALPRAZOLAM 0.5 MG PO TABS: ORAL_TABLET | ORAL | 2 refills | Status: DC

## 2016-12-13 NOTE — Progress Notes (Signed)
Patient ID: Jared Griffin, male   DOB: 1961-06-28, 56 y.o.   MRN: [id number] Patient ID: Jared Griffin, male   DOB: 26-Dec-1960, 56 y.o.   MRN: [id number]  Psychiatric  Adult follow-up  Patient Identification: Jared Griffin MRN:  [id number] Date of Evaluation:  12/13/2016 Referral Source: Dr. [name] Chief Complaint:   Chief Complaint    Depression; Anxiety; Follow-up     Visit Diagnosis:    ICD-9-CM ICD-10-CM   1. Moderate single current episode of major depressive disorder ([place]) 296.22 F32.1    Diagnosis:   Patient Active Problem List   Diagnosis Date Noted  . Cervical nerve root impingement [G54.2] 12/09/2015  . Generalized anxiety disorder [F41.1] 09/27/2015  . Chest pain [R07.9] 09/20/2015  . Pain in the chest [R07.9]   . Depression [F32.9] 01/24/2015  . Esophageal reflux [K21.9] 01/24/2015  . Preoperative cardiovascular examination [Z01.810] 06/28/2014  . Type 2 diabetes mellitus ([place]) [E11.9]   . Mixed hyperlipidemia [E78.2]   . 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 Fatty liver disease, nonalcoholic [Y86.5]   . Obesity [E66.9]   . Intermediate coronary syndrome ([place]) [I20.0] 12/09/2013  . Fatty liver [K76.0] 05/05/2013  . Lumbago [M54.5] 05/21/2012  . Essential hypertension, benign [I10] 12/28/2009  . CAD S/P LAD DES March 2015 [I25.10, Z98.61] 12/28/2009  . Hyperlipidemia LDL goal <70 [E78.5] 11/25/2009  . Accelerating angina ([place]) [I20.0] 11/25/2009  . DM [E11.9] 11/24/2009  . ABDOMINAL PAIN, HX OF [Z91.89] 11/24/2009   History of Present Illness:  This patient is a 56 year old married white male lives with his wife and 61 year old son in [place]. 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  The patient was referred by his primary physician, Dr. [name], for further assessment and treatment of anxiety and depression.  The patient states that he is become  increasingly depressed and anxious over the last couple of years due to work stress. He stated that he was always under pressure to get more things done than he had time to do. 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 He got to the point of hating going to work. Several years ago he had cardiac problems and had a stent placed. In January of this year he began developing dizzy spells chest pain headaches stomach aches. It got so bad that he was readmitted for cardiac catheterization but nothing new was found.  The patient was having difficulty sleeping constant worry and anxiety about his job sadness and anger. He was getting irritable with people at home. After talking to his primary care physician at length and decided to take him out on medical retirement. The patient has been on Wellbutrin SR 150 no gram twice a day for several weeks now. He had also been out in the past when his wife was sick. He has not seen any benefit from this but does feel tremendously better since he stopped working. He is also on Xanax 0.5 mg 3 times a day which he thinks has helped to some degree.  Since stopping working he's had a big turnaround. His sleep and energy have improved. 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 He is much less anxious and is no longer having the chest pain dizzy spells or stomachaches. He denies suicidal  ideation or psychotic symptoms. He does not use drugs or alcohol  The patient returns after 3 months. 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 He like staying busy and he enjoys the work. For the most part his mood has been good although he occasionally gets depressed about financial things. His energy is good and the Xanax has helped a great deal with his anxiety. He is sleeping fairly well Elements:  Location:  Global. Quality:   Improving. Severity:  Moderate. Timing:  Daily. Duration:  Approximately 2 years. Context:  Stressful work environment. Associated Signs/Symptoms: Depression Symptoms:  depressed mood, anhedonia, psychomotor agitation, fatigue, difficulty concentrating, anxiety, loss of energy/fatigue, disturbed sleep, (Hypo) Manic Symptoms:  Irritable Mood, Anxiety Symptoms:  Excessive Worry,   Past Medical History:  Past Medical History:  Diagnosis Date  . Arthritis   . 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 Coronary atherosclerosis of native coronary artery    a. 12/09/2013: s/p PCI with 3.0 x 28 Promus DES to mLAD  . Essential hypertension, benign   . Fatty liver disease, nonalcoholic   . Mixed hyperlipidemia 2006  . Obesity   . Osgood-Schlatter's disease of right knee   . Type 2 diabetes mellitus ([place])     Past Surgical History:  Procedure Laterality Date  . CARDIAC CATHETERIZATION  2011  . CARDIAC CATHETERIZATION N/A 09/20/2015   Procedure: Left Heart Cath and Coronary Angiography;  Surgeon: [name], MD;  Location: [facility];  Service: Cardiovascular;  Laterality: N/A;  . CHOLECYSTECTOMY  2003  . COLONOSCOPY  06/19/2012   Procedure: COLONOSCOPY;  Surgeon: [name], MD;  [facility];  Service: Endoscopy;  Laterality: N/A;  930  . KNEE ARTHROPLASTY Right 1999  . LEFT HEART CATHETERIZATION WITH CORONARY ANGIOGRAM N/A 12/09/2013   Procedure: LEFT HEART CATHETERIZATION WITH CORONARY ANGIOGRAM;  Surgeon: [name], MD;  Location: [facility];  Service: Cardiovascular;  Laterality: N/A;  . Liver biopsy    . TONSILLECTOMY  1970's   Family History:  Family History  Problem Relation Age of Onset  . Cancer Maternal Aunt   . CVA Maternal Grandmother   . CVA Maternal Grandfather    Social History:   Social History   Social History  . Marital status: Married    Spouse name: N/A  . Number of children: N/A  . Years of  education: N/A   Social History Main Topics  . Smoking status: Former Smoker    Packs/day: 0.50    Years: 10.00    Types: Cigarettes    Start date: 02/02/1973    Quit date: 09/17/1983  . Smokeless tobacco: Former Systems developer    Types: [place] date: 09/17/1983  . Alcohol use No     Comment: 11-30-15 per pt no  . Drug use: No     Comment: 11-30-15 per pt no  . Sexual activity: Yes   Other Topics Concern  . 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    Married with 1 child   Right handed    12 th    Very little caffeine   Additional Social History: The patient grew up primarily in [place] with both parents. He states he has another sister but she is not involved in the family and he has had little contact with her.  He denies any history of abuse or trauma. 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 He is married and has one 69 year old son. His wife has had severe gastric reflux and has had to go on disability as well  Musculoskeletal: Strength & Muscle Tone: within normal limits Gait & Station: normal Patient leans: N/A  Psychiatric Specialty Exam: Depression         Past medical history includes anxiety.   Anxiety       Review of Systems  Psychiatric/Behavioral: Positive for depression.    Blood pressure 116/77, pulse 83, resp. rate 18, weight 235 lb 12.8 oz (107 kg).Body mass index is 30.27 kg/m.  General Appearance: Casual, Neat and Well Groomed  Eye Contact:  Good  Speech:  Clear and Coherent  Volume:  Normal  Mood:  good  Affect: Congruent     Orientation:  Full (Time, Place, and Person)  Thought Content:  WDL  Suicidal Thoughts:  No  Homicidal Thoughts:  No  Memory:  Immediate;   Good Recent;   Good Remote;   Good  Judgement:  Good  Insight:  Good  Psychomotor Activity:  Normal  Concentration:  Good   Recall:  Good  Fund of Knowledge:Good  Language: Good  Akathisia:  No  Handed:  Right  AIMS (if indicated):    Assets:  Communication Skills Desire for Improvement Resilience Social Support Talents/Skills  ADL's:  Intact  Cognition: WNL  Sleep:  ok   Is the patient at risk to self?  No. Has the patient been a risk to self in the past 6 months?  No. Has the patient been a risk to self within the distant past?  No. Is the patient a risk to others?  No. Has the patient been a risk to others in the past 6 months?  No. Has the patient been a risk to others within the distant past?  No.  Allergies:   Allergies  Allergen Reactions  . Metoprolol Rash   Current Medications: Current Outpatient Prescriptions  Medication Sig Dispense Refill  . ACCU-CHEK AVIVA PLUS test strip CHECK BLOOD SUGAR UP TO FOUR TIMES A DAY. 100 each 5  . acetaminophen (TYLENOL) 500 MG tablet Take 1,000 mg by mouth daily as needed for headache. Pain.    Marland Kitchen ALPRAZolam (XANAX) 0.5 MG tablet Take 1 tablet three times a day as needed 90 tablet 2  . amoxicillin-clavulanate (AUGMENTIN) 875-125 MG tablet Take 1 tablet by mouth 2 (two) times daily. (Patient not taking: Reported on 11/22/2016) 20 tablet 0  . aspirin (ASPIR-LOW) 81 MG EC tablet Take 81 mg by mouth daily.     Marland Kitchen atorvastatin (LIPITOR) 40 MG tablet TAKE ONE (1) TABLET BY MOUTH EVERY DAY 30 tablet 6  . canagliflozin (INVOKANA) 300 MG TABS tablet TAKE BY MOUTH 1 TABLET EVERY DAY BEFORE BREAKFAST 30 tablet 5  . cefdinir (OMNICEF) 300 MG capsule Take 1 capsule (300 mg total) by mouth 2 (two) times daily. (Patient not taking: Reported on 11/22/2016) 20 capsule 0  . diltiazem (CARDIZEM) 30 MG tablet Take 1 tablet (30 mg total) by mouth 2 (two) times daily. 60 tablet 5  . gabapentin (NEURONTIN) 300 MG capsule Take 600 mg by mouth 2 (two) times daily.     Marland Kitchen glipiZIDE (GLUCOTROL) 5 MG tablet Take 2 tablets by mouth twice a daily. 120 tablet 5  . HYDROcodone-acetaminophen  (NORCO) 7.5-325 MG tablet Take  1 tablet by mouth every 4 (four) hours as needed for moderate pain. 30 tablet 0  . HYDROcodone-homatropine (HYCODAN) 5-1.5 MG/5ML syrup Take 5 mLs by mouth every 4 (four) hours as needed. (Patient not taking: Reported on 11/22/2016) 90 mL 0  . losartan (COZAAR) 25 MG tablet Take 1 tablet (25 mg total) by mouth daily. 30 tablet 11  . metFORMIN (GLUCOPHAGE) 500 MG tablet TAKE TWO TABLET EVERY MORNING, THEN ONE TABLET AT NOON, THEN TWO TABLETS EVERY EVENING 450 tablet 1  . nitroGLYCERIN (NITROSTAT) 0.4 MG SL tablet Place 1 tablet (0.4 mg total) under the tongue every 5 (five) minutes as needed for chest pain. 25 tablet 3  . ondansetron (ZOFRAN) 4 MG tablet Take 1 tablet (4 mg total) by mouth every 8 (eight) hours as needed for nausea or vomiting. 8 tablet 0  . pantoprazole (PROTONIX) 40 MG tablet TAKE ONE TABLET BY MOUTH ONCE DAILY. 30 tablet 5  . ranitidine (ZANTAC) 150 MG tablet Take 150 mg by mouth daily as needed for heartburn.    . ranolazine (RANEXA) 500 MG 12 hr tablet Take 1 tablet (500 mg total) by mouth 2 (two) times daily. 60 tablet 6  . sertraline (ZOLOFT) 100 MG tablet Take 1 tablet (100 mg total) by mouth daily. 30 tablet 2   No current facility-administered medications for this visit.     Previous Psychotropic Medications: Yes   Substance Abuse History in the last 12 months:  No.  Consequences of Substance Abuse: NA  Medical Decision Making:  Review of Psycho-Social Stressors (1), Review or order clinical lab tests (1), Review and summation of old records (2), Review of Medication Regimen & Side Effects (2) and Review of New Medication or Change in Dosage (2)  Treatment Plan Summary: Medication management  The patient will continueZoloft  100 mg daily for depression and anxiety. He'll continue Xanax 0.5 mg up to 3 times a day for anxiety as well.He'll return to see me in 3 months    ROSS, [facility] 3/29/201811:49 AM

## 2016-12-13 NOTE — Telephone Encounter (Signed)
Refill request for Zoloft. Patient has appointment for today and will receive refills at visit.

## 2016-12-17 ENCOUNTER — Other Ambulatory Visit: Payer: Self-pay | Admitting: Cardiovascular Disease

## 2017-01-03 ENCOUNTER — Other Ambulatory Visit: Payer: Self-pay | Admitting: Family Medicine

## 2017-02-06 ENCOUNTER — Encounter: Payer: Self-pay | Admitting: Orthopedic Surgery

## 2017-02-06 ENCOUNTER — Ambulatory Visit (INDEPENDENT_AMBULATORY_CARE_PROVIDER_SITE_OTHER): Payer: Worker's Compensation | Admitting: Orthopedic Surgery

## 2017-02-06 VITALS — BP 141/92 | HR 93 | Ht 75.0 in | Wt 232.0 lb

## 2017-02-06 DIAGNOSIS — M7711 Lateral epicondylitis, right elbow: Secondary | ICD-10-CM

## 2017-02-06 MED ORDER — PREDNISONE 10 MG PO TABS: 10.0000 mg | ORAL_TABLET | Freq: Two times a day (BID) | ORAL | 0 refills | Status: DC

## 2017-02-06 NOTE — Patient Instructions (Signed)
It's okay to remove the sling and use only as needed  15 pound limit for lifting with right arm for the next 6 weeks  Workers comp to arrange physical therapy for 6 weeks 3 times a week  Start prednisone 10 mg twice a day  Follow-up 6 weeks

## 2017-02-06 NOTE — Progress Notes (Signed)
NEW PATIENT OFFICE VISIT    Chief Complaint  Patient presents with  . Elbow Pain    RIGHT ELBOW/FOREARM PAIN    56 year old male who was involved in a Worker's Comp. related problem with his right elbow. He works at [employer] zone. 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 He did get some improvement in symptoms but one week later he lost full range of motion in his elbow and had painful lifting including simple things such as a cup coffee. His pain is dull it's over his lateral epicondyle radiates proximally and distally when severe. He did try some ibuprofen but he did tolerate that well and he is a cardiac survivor and it was advised that he cannot take NSAIDs. He did try some cream over the elbow but that did not help. He eventually had x-ray which was normal he had an MRI which showed multiple chronic findings and symptoms acute on chronic findings such as bicipital tendinitis ulnar collateral ligament strain but more importantly partial tearing of his common flexor tendon origin which is the site of primary discomfort as well as over the triceps which did show some tendinitis    Review of Systems  Constitutional: Negative for chills and fever.  Cardiovascular: Positive for chest pain.  Skin: Negative for itching and rash.     Past Medical History:  Diagnosis Date  . Arthritis   . 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 Coronary atherosclerosis of native coronary artery    a. 12/09/2013: s/p PCI with 3.0 x 28 Promus DES to mLAD  . Essential hypertension, benign   . Fatty liver disease, nonalcoholic   . Mixed hyperlipidemia 2006  . Obesity   . Osgood-Schlatter's disease of right knee   . Type 2 diabetes mellitus ([place])     Past Surgical History:  Procedure Laterality Date  . CARDIAC CATHETERIZATION  2011  .  CARDIAC CATHETERIZATION N/A 09/20/2015   Procedure: Left Heart Cath and Coronary Angiography;  Surgeon: [name], MD;  Location: [facility];  Service: Cardiovascular;  Laterality: N/A;  . CHOLECYSTECTOMY  2003  . COLONOSCOPY  06/19/2012   Procedure: COLONOSCOPY;  Surgeon: [name], MD;  [facility];  Service: Endoscopy;  Laterality: N/A;  930  . KNEE ARTHROPLASTY Right 1999  . LEFT HEART CATHETERIZATION WITH CORONARY ANGIOGRAM N/A 12/09/2013   Procedure: LEFT HEART CATHETERIZATION WITH CORONARY ANGIOGRAM;  Surgeon: [name], MD;  Location: [facility]ATH LAB;  Service: Cardiovascular;  Laterality: N/A;  . Liver biopsy    . TONSILLECTOMY  1970's    Family History  Problem Relation Age of Onset  . Cancer Maternal Aunt   . CVA Maternal Grandmother   . CVA Maternal Grandfather    Social History  Substance Use Topics  . Smoking status: Former Smoker    Packs/day: 0.50    Years: 10.00    Types: Cigarettes    Start date: 02/02/1973    Quit date: 09/17/1983  . Smokeless tobacco: Former Systems developer    Types: [place] date: 09/17/1983  . Alcohol use No     Comment: 11-30-15 per pt no    BP (!) 141/92   Pulse 93   Ht 6' 3"  (1.905 m)   Wt 232 lb (105.2 kg)  BMI 29.00 kg/m   Physical Exam  Constitutional: He appears well-developed and well-nourished.  Vital signs have been reviewed and are stable. Gen. appearance the patient is well-developed and well-nourished with normal grooming and hygiene. The patient is oriented 3 with normal mood and affect.  Vitals reviewed.   Ortho Exam   Examination the right elbow shows tenderness over the lateral epicondyle and olecranon but not medially. No tenderness in the biceps tendon he said he had some but that resolved. In terms of range of motion he has normal supination although it's painful he has decreased pronation with only 15 past neutral. His flexion is good. His extension shows 20 loss of  extension his medial and lateral collateral ligaments are stable his strength is normal in flexion extension but abnormal in pronation supination skin is intact no rash pulses excellent lymph nodes are negative sensation is normal  Left elbow normal range of motion  X-ray report was read as normal I could not read it there was no picture on the disc that was readable. MRI report showed several findings one of them was a partial tear of the common extensor origin everything else was pretty much tendinitis tendinosis low-grade sprain ulnar collateral ligament. Clinically there was no ulnar collateral ligament symptoms or clinical finding    Meds ordered this encounter  Medications  . predniSONE (DELTASONE) 10 MG tablet    Sig: Take 1 tablet (10 mg total) by mouth 2 (two) times daily with a meal.    Dispense:  60 tablet    Refill:  0    Encounter Diagnosis  Name Primary?  . Right tennis elbow Yes     PLAN:  Partial tear right common extensor tendon origin  Recommend physical therapy and prednisone 10 mg twice a day. He can use a sling as needed. He should continue lifting restrictions for 6 weeks with a 15 pound limit  Follow-up in 6 weeks  Performed therapy 3 times a week for the next 6 weeks

## 2017-03-07 ENCOUNTER — Encounter: Payer: Self-pay | Admitting: Orthopedic Surgery

## 2017-03-07 ENCOUNTER — Telehealth: Payer: Self-pay | Admitting: Orthopedic Surgery

## 2017-03-07 NOTE — Telephone Encounter (Signed)
(  03/06/17) Called back to workers comp - patient also called, states has not heard anything either regarding therapy; states he's been assigned to a new adjuster, Rivka Safer, at same insurer, [employer]. I have called new adjuster, ph# [phone] (general ph# [phone])/fax# [phone]; reached voice message, requesting this to be addressed as soon as possible, as it has been in processing since 02/07/17.  Called patient to notify.* Rivka Safer returned call. Initially stated she needed all notes and physical therapy notes re-faxed or emailed ([email or web address]) which I did send.   Received call back from Ms. Meda Coffee - states she located all information and was working on physical therapy location per 3rd party [employer], and also is keeping in touch with patient. 03/07/17 Patient was reached by Ms. Meda Coffee - therapy approved at [facility] [place].  Order faxed to their office, for appointment scheduled today, 2:30p.m.* *Also, per Dr [name], patient's appointment here will need to be moved out, due to therapy just starting.  Offered patient first week in July - needs to wait till later date; re-scheduled to 04/02/17, and work note updated accordingly; also notified workers Production assistant, radio Ms. Meda Coffee.

## 2017-03-12 ENCOUNTER — Ambulatory Visit: Payer: Self-pay | Admitting: Orthopedic Surgery

## 2017-03-15 ENCOUNTER — Other Ambulatory Visit: Payer: Self-pay

## 2017-03-15 ENCOUNTER — Ambulatory Visit (HOSPITAL_COMMUNITY): Payer: Self-pay | Admitting: Psychiatry

## 2017-03-15 NOTE — Telephone Encounter (Signed)
received a fax requesting a refill on sertraline hcl 114m. pt was last seen on  12-13-16 where a rx was sent with 2 additional refill which would last until 03-15-17 but pt has appt on  03-25-17 pt does not have enough medication to do until appt.   Medication Detail    Disp Refills Start End   sertraline (ZOLOFT) 100 MG tablet 30 tablet 2 12/13/2016 12/13/2017   Sig - Route: Take 1 tablet (100 mg total) by mouth daily. - Oral   E-Prescribing Status: Receipt confirmed by pharmacy (12/13/2016 11:47 AM EDT)

## 2017-03-18 MED ORDER — SERTRALINE HCL 100 MG PO TABS: 100.0000 mg | ORAL_TABLET | Freq: Every day | ORAL | 0 refills | Status: DC

## 2017-03-18 NOTE — Telephone Encounter (Signed)
pharmacy sent a fax requesting a refill on sertraline hcl 174m pt was last seen on 12-13-16 next appt  03-25-17.       Disp Refills Start End   sertraline (ZOLOFT) 100 MG tablet 30 tablet 2 12/13/2016 12/13/2017   Sig - Route: Take 1 tablet (100 mg total) by mouth daily. - Oral   E-Prescribing Status: Receipt confirmed by pharmacy (12/13/2016 11:47 AM EDT)

## 2017-03-18 NOTE — Telephone Encounter (Signed)
Dr. Harrington Challenger refilled.  Done   Disp Refills Start End  sertraline (ZOLOFT) 100 MG tablet 11 tablet 0 03/18/2017 03/18/2018  Sig - Route: Take 1 tablet (100 mg total) by mouth daily. - Oral  E-Prescribing Status: Receipt confirmed by pharmacy (03/18/2017 1:10 PM EDT)

## 2017-03-25 ENCOUNTER — Ambulatory Visit (HOSPITAL_COMMUNITY): Payer: Self-pay | Admitting: Psychiatry

## 2017-03-28 ENCOUNTER — Ambulatory Visit (INDEPENDENT_AMBULATORY_CARE_PROVIDER_SITE_OTHER): Payer: PRIVATE HEALTH INSURANCE | Admitting: Cardiovascular Disease

## 2017-03-28 ENCOUNTER — Encounter: Payer: Self-pay | Admitting: Cardiovascular Disease

## 2017-03-28 VITALS — BP 94/63 | HR 87 | Ht 74.0 in | Wt 235.4 lb

## 2017-03-28 DIAGNOSIS — F419 Anxiety disorder, unspecified: Secondary | ICD-10-CM | POA: Diagnosis not present

## 2017-03-28 DIAGNOSIS — I10 Essential (primary) hypertension: Secondary | ICD-10-CM

## 2017-03-28 DIAGNOSIS — R0789 Other chest pain: Secondary | ICD-10-CM

## 2017-03-28 DIAGNOSIS — R0609 Other forms of dyspnea: Secondary | ICD-10-CM | POA: Diagnosis not present

## 2017-03-28 DIAGNOSIS — E782 Mixed hyperlipidemia: Secondary | ICD-10-CM | POA: Diagnosis not present

## 2017-03-28 DIAGNOSIS — I25118 Atherosclerotic heart disease of native coronary artery with other forms of angina pectoris: Secondary | ICD-10-CM

## 2017-03-28 DIAGNOSIS — R0600 Dyspnea, unspecified: Secondary | ICD-10-CM

## 2017-03-28 NOTE — Progress Notes (Signed)
SUBJECTIVE: The patient presents for follow-up of coronary disease with LAD stent placement in March 2015, hypertension, and hyperlipidemia.  Coronary angiography in 2016 demonstrated stent patency and a mid LAD stenosis of 40%.  He has felt less energetic and more short of breath lately. He feels tired by the end working day. He says he does not sleep enough. He has had episodic chest pressure lasting seconds but not long enough to require nitroglycerin.  He seldom has acid reflux symptoms.  He is dizzy and short of breath when going from the squatting to standing position.  Blood pressure yesterday was 130/87, today 138/87 at home.  He is also treated for anxiety and depression.  He has a chronic cough and seasonal allergies. He denies a productive cough and wheezing.   Review of Systems: As per "subjective", otherwise negative.  Allergies  Allergen Reactions  . Metoprolol Rash    Current Outpatient Prescriptions  Medication Sig Dispense Refill  . ACCU-CHEK AVIVA PLUS test strip CHECK BLOOD SUGAR UP TO FOUR TIMES A DAY. 100 each 5  . acetaminophen (TYLENOL) 500 MG tablet Take 1,000 mg by mouth daily as needed for headache. Pain.    Marland Kitchen ALPRAZolam (XANAX) 0.5 MG tablet Take 1 tablet three times a day as needed 90 tablet 2  . aspirin (ASPIR-LOW) 81 MG EC tablet Take 81 mg by mouth daily.     Marland Kitchen atorvastatin (LIPITOR) 40 MG tablet TAKE ONE (1) TABLET BY MOUTH EVERY DAY 30 tablet 6  . canagliflozin (INVOKANA) 300 MG TABS tablet TAKE BY MOUTH 1 TABLET EVERY DAY BEFORE BREAKFAST 30 tablet 5  . diltiazem (CARDIZEM) 30 MG tablet Take 1 tablet (30 mg total) by mouth 2 (two) times daily. 60 tablet 5  . gabapentin (NEURONTIN) 300 MG capsule Take 600 mg by mouth 2 (two) times daily.     Marland Kitchen glipiZIDE (GLUCOTROL) 5 MG tablet Take 2 tablets by mouth twice a daily. 120 tablet 5  . HYDROcodone-acetaminophen (NORCO) 7.5-325 MG tablet Take 1 tablet by mouth every 4 (four) hours as needed for  moderate pain. 30 tablet 0  . metFORMIN (GLUCOPHAGE) 500 MG tablet TAKE TWO TABLET EVERY MORNING, THEN ONE TABLET AT NOON, THEN TWO TABLETS EVERY EVENING 450 tablet 1  . nitroGLYCERIN (NITROSTAT) 0.4 MG SL tablet Place 1 tablet (0.4 mg total) under the tongue every 5 (five) minutes as needed for chest pain. 25 tablet 3  . ondansetron (ZOFRAN) 4 MG tablet Take 1 tablet (4 mg total) by mouth every 8 (eight) hours as needed for nausea or vomiting. 8 tablet 0  . pantoprazole (PROTONIX) 40 MG tablet TAKE ONE TABLET BY MOUTH ONCE DAILY. 30 tablet 5  . RANEXA 500 MG 12 hr tablet TAKE ONE TABLET TWICE DAILY 60 tablet 6  . ranitidine (ZANTAC) 150 MG tablet Take 150 mg by mouth daily as needed for heartburn.    . sertraline (ZOLOFT) 100 MG tablet Take 1 tablet (100 mg total) by mouth daily. 11 tablet 0   No current facility-administered medications for this visit.     Past Medical History:  Diagnosis Date  . Arthritis   . 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 Coronary atherosclerosis of native coronary artery    a. 12/09/2013: s/p PCI with 3.0 x 28 Promus DES to mLAD  . Essential hypertension, benign   . Fatty liver disease, nonalcoholic   . Mixed hyperlipidemia 2006  . Obesity   . Osgood-Schlatter's disease  of right knee   . Type 2 diabetes mellitus ([place])     Past Surgical History:  Procedure Laterality Date  . CARDIAC CATHETERIZATION  2011  . CARDIAC CATHETERIZATION N/A 09/20/2015   Procedure: Left Heart Cath and Coronary Angiography;  Surgeon: [name], MD;  Location: [facility];  Service: Cardiovascular;  Laterality: N/A;  . CHOLECYSTECTOMY  2003  . COLONOSCOPY  06/19/2012   Procedure: COLONOSCOPY;  Surgeon: [name], MD;  [facility];  Service: Endoscopy;  Laterality: N/A;  930  . KNEE ARTHROPLASTY Right 1999  . LEFT HEART CATHETERIZATION WITH CORONARY ANGIOGRAM N/A 12/09/2013   Procedure: LEFT HEART CATHETERIZATION WITH CORONARY  ANGIOGRAM;  Surgeon: [name], MD;  Location: [facility] CATH LAB;  Service: Cardiovascular;  Laterality: N/A;  . Liver biopsy    . TONSILLECTOMY  1970's    Social History   Social History  . Marital status: Married    Spouse name: N/A  . Number of children: N/A  . Years of education: N/A   Occupational History  . Not on file.   Social History Main Topics  . Smoking status: Former Smoker    Packs/day: 0.50    Years: 10.00    Types: Cigarettes    Start date: 02/02/1973    Quit date: 09/17/1983  . Smokeless tobacco: Former Systems developer    Types: [place] date: 09/17/1983  . Alcohol use No     Comment: 11-30-15 per pt no  . Drug use: No     Comment: 11-30-15 per pt no  . Sexual activity: Yes   Other Topics Concern  . 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    Married with 1 child   Right handed    12 th    Very little caffeine     Vitals:   03/28/17 1120  BP: 94/63  Pulse: 87  SpO2: 97%  Weight: 235 lb 6.4 oz (106.8 kg)  Height: 6' 2"  (1.88 m)    Wt Readings from Last 3 Encounters:  03/28/17 235 lb 6.4 oz (106.8 kg)  02/06/17 232 lb (105.2 kg)  12/13/16 235 lb 12.8 oz (107 kg)     PHYSICAL EXAM General: NAD HEENT: Normal. Neck: No JVD, no thyromegaly. Lungs: Clear to auscultation bilaterally with normal respiratory effort. CV: Nondisplaced PMI.  Regular rate and rhythm, normal S1/S2, no S3/S4, no murmur. No pretibial or periankle edema.  No carotid bruit.   Abdomen: Soft, nontender, no distention.  Neurologic: Alert and oriented.  Psych: Normal affect. Skin: Normal. Musculoskeletal: No gross deformities.    ECG: Most recent ECG reviewed.   Labs: Lab Results  Component Value Date/Time   K 3.9 09/11/2016 01:00 AM   BUN 23 (H) 09/11/2016 01:00 AM   BUN 17 04/30/2016 08:17 AM   CREATININE 0.93 09/11/2016 01:00 AM   CREATININE 0.96 12/07/2013 09:50 AM   ALT  26 11/24/2016 08:06 AM   TSH 0.493 02/05/2014 09:51 AM   HGB 16.8 09/11/2016 01:00 AM     Lipids: Lab Results  Component Value Date/Time   LDLCALC 70 11/24/2016 08:06 AM   CHOL 138 11/24/2016 08:06 AM   TRIG 179 (H) 11/24/2016 08:06 AM   HDL 32 (L) 11/24/2016 08:06 AM       ASSESSMENT AND PLAN:  1. CAD and LAD stent in 11/2013 with shortness of breath  and chest pressure: I do not feel that his symptoms warrant cardiac testing at this time. They appear to be atypical. Stent was patent by coronary angiography in 2016. Has 40% mid LAD stenosis. Continue aspirin, Lipitor, and Ranexa.  2. Essential HTN: Blood pressure yesterday was 130/87, today 138/87 at home. No changes to therapy.  3. Hyperlipidemia: Continue statin.    Disposition: Follow up 1 yr   Kate Sable, M.D., F.A.C.C.

## 2017-03-28 NOTE — Patient Instructions (Signed)

## 2017-04-02 ENCOUNTER — Ambulatory Visit (INDEPENDENT_AMBULATORY_CARE_PROVIDER_SITE_OTHER): Payer: Worker's Compensation | Admitting: Orthopedic Surgery

## 2017-04-02 ENCOUNTER — Encounter: Payer: Self-pay | Admitting: Orthopedic Surgery

## 2017-04-02 DIAGNOSIS — M7711 Lateral epicondylitis, right elbow: Secondary | ICD-10-CM

## 2017-04-02 NOTE — Progress Notes (Signed)
Follow up visit   Chief Complaint  Patient presents with  . 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 He has had his MRI he does have some tendon damage there.  His therapy is going well his therapy note does indicate improvement after 8 visits   Review of Systems  Skin: Negative.   Neurological: Negative for tingling and sensory change.    Right Elbow Exam   Tenderness  The patient is experiencing tenderness in the lateral epicondyle.  Range of Motion  Extension: -10 Flexion:     Normal Pronation:   Supination:  Muscle Strength  Wrist Extension: 4/5 Wrist Flexion:   5/5 Grip:                   4/5 Pronation:           Supination:          Tests  Varus:   Negative Valgus:  Negative  Comments:  NORMAL SENSATION IN THE RIGHT ARM AND NORMAL RADIAL PULSE    Encounter Diagnosis  Name Primary?  . Right tennis elbow Yes    IMPROVING   CONTINUE PT AND WORK RESTRICTIONS  INJECT (1ST ONE)   Procedure note injection for right tennis elbow   Diagnosis right tennis elbow  Anesthesia ethyl chloride was used Alcohol use is clean the skin  After we obtained verbal consent and timeout a 25-gauge needle was used to inject 40 mg of Depo-Medrol and 3 cc of 1% lidocaine just distal to the insertion of the ECRB  There were no complications and a sterile bandage was applied.

## 2017-04-02 NOTE — Patient Instructions (Signed)
Continue therapy   Continue current work restriction x 6 weeks

## 2017-04-08 ENCOUNTER — Telehealth (HOSPITAL_COMMUNITY): Payer: Self-pay | Admitting: *Deleted

## 2017-04-08 NOTE — Telephone Encounter (Signed)
patient called to schedule an appointment.   He said he is out of his Sertraline.

## 2017-04-09 ENCOUNTER — Encounter: Payer: Self-pay | Admitting: Family Medicine

## 2017-04-09 ENCOUNTER — Other Ambulatory Visit (HOSPITAL_COMMUNITY): Payer: Self-pay | Admitting: Psychiatry

## 2017-04-09 ENCOUNTER — Ambulatory Visit (INDEPENDENT_AMBULATORY_CARE_PROVIDER_SITE_OTHER): Payer: PRIVATE HEALTH INSURANCE | Admitting: Family Medicine

## 2017-04-09 VITALS — BP 124/84 | Temp 98.1°F | Ht 75.0 in | Wt 237.0 lb

## 2017-04-09 DIAGNOSIS — R5383 Other fatigue: Secondary | ICD-10-CM

## 2017-04-09 DIAGNOSIS — R42 Dizziness and giddiness: Secondary | ICD-10-CM

## 2017-04-09 MED ORDER — SERTRALINE HCL 100 MG PO TABS: 100.0000 mg | ORAL_TABLET | Freq: Every day | ORAL | 2 refills | Status: DC

## 2017-04-09 NOTE — Telephone Encounter (Signed)
sent 

## 2017-04-09 NOTE — Progress Notes (Signed)
   Subjective:    Patient ID: Jared Griffin, male    DOB: 05-02-1961, 56 y.o.   MRN: [id number] Patient presents for several distinct concerns. Sinus Problem  This is a new problem. The current episode started 1 to 4 weeks ago. Associated symptoms include headaches. (Dizzy, fatigue) Treatments tried: otc sinus meds.   Patient states he saw his cardiologist concerning the dizziness and fatigue but it is ongoing.  Dizzy with movemnt and liightheaded partic after standing immediately following croucing. On further history more of a equilibrium slightly spinning dizziness.  Overheats too easliy, will work hard, get to dripping wet, and feeling poorly   Takes pr fed pills for sinuses  sudafd equiv  Sugars good  Quite stressed out with an 18 hr day, child issues, and wife care issues with chronic illness  and arm injury and rehab  Seeing dr Leonard Downing next thur   Patient notes fatigue. One month's duration. Facing a lot of stress working full time, doing childcare in the evening. Dealing with a spouse who has an amazing number of chronic and physical and mental issues    Review of Systems  Neurological: Positive for headaches.  No headache, no major weight loss or weight gain, no chest pain no back pain abdominal pain no change in bowel habits complete ROS otherwise negative      Objective:   Physical Exam Alert and oriented, vitals reviewed and stable, NAD ENT-TM's and ext canals WNL bilat via otoscopic exam Soft palate, tonsils and post pharynx WNL via oropharyngeal exam Neck-symmetric, no masses; thyroid nonpalpable and nontender Pulmonary-no tachypnea or accessory muscle use; Clear without wheezes via auscultation Card--no abnrml murmurs, rhythm reg and rate WNL Carotid pulses symmetric, without bruits        Assessment & Plan:  Impression 1 dizziness likely in her ear in nature after careful history discussed. Patient declines ENT intervention #2 fatigue profound in several  weeks duration likely multifactorial we'll check some blood work rationale discussed #3 increased stress. Due to see psychiatrist soon. Plan diet exercise discussed. Appropriate blood work. Medications discuss  Greater than 50% of this 25 minute face to face visit was spent in counseling and discussion and coordination of care regarding the above diagnosis/diagnosies

## 2017-04-09 NOTE — Telephone Encounter (Signed)
Per pt phone call, he is out of refills for his Zoloft 100 mg QD. Pt medication was last filled on 03-18-2017 with 11 tabs 0 refill. Pt was last seen on 12-13-2016 and has f/u appt for 04-18-2017. Pt number is [phone].

## 2017-04-10 NOTE — Telephone Encounter (Signed)
noted 

## 2017-04-14 LAB — CBC WITH DIFFERENTIAL/PLATELET
Basophils Absolute: 0 10*3/uL (ref 0.0–0.2)
Basos: 0 %
EOS (ABSOLUTE): 0.3 10*3/uL (ref 0.0–0.4)
Eos: 6 %
Hematocrit: 45.5 % (ref 37.5–51.0)
Hemoglobin: 15.3 g/dL (ref 13.0–17.7)
Immature Grans (Abs): 0 10*3/uL (ref 0.0–0.1)
Immature Granulocytes: 0 %
Lymphocytes Absolute: 2.2 10*3/uL (ref 0.7–3.1)
Lymphs: 36 %
MCH: 29.8 pg (ref 26.6–33.0)
MCHC: 33.6 g/dL (ref 31.5–35.7)
MCV: 89 fL (ref 79–97)
Monocytes Absolute: 0.5 10*3/uL (ref 0.1–0.9)
Monocytes: 9 %
Neutrophils Absolute: 3 10*3/uL (ref 1.4–7.0)
Neutrophils: 49 %
Platelets: 138 10*3/uL — ABNORMAL LOW (ref 150–379)
RBC: 5.14 x10E6/uL (ref 4.14–5.80)
RDW: 14.3 % (ref 12.3–15.4)
WBC: 6.1 10*3/uL (ref 3.4–10.8)

## 2017-04-14 LAB — BASIC METABOLIC PANEL
BUN/Creatinine Ratio: 15 (ref 9–20)
BUN: 13 mg/dL (ref 6–24)
CO2: 24 mmol/L (ref 20–29)
Calcium: 9.2 mg/dL (ref 8.7–10.2)
Chloride: 101 mmol/L (ref 96–106)
Creatinine, Ser: 0.87 mg/dL (ref 0.76–1.27)
GFR calc Af Amer: 112 mL/min/{1.73_m2} (ref >59)
GFR calc non Af Amer: 97 mL/min/{1.73_m2} (ref >59)
Glucose: 215 mg/dL — ABNORMAL HIGH (ref 65–99)
Potassium: 4.1 mmol/L (ref 3.5–5.2)
Sodium: 142 mmol/L (ref 134–144)

## 2017-04-14 LAB — TSH: TSH: 0.458 u[IU]/mL (ref 0.450–4.500)

## 2017-04-14 LAB — HEPATIC FUNCTION PANEL
ALT: 26 IU/L (ref 0–44)
AST: 19 IU/L (ref 0–40)
Albumin: 4.4 g/dL (ref 3.5–5.5)
Alkaline Phosphatase: 41 IU/L (ref 39–117)
Bilirubin Total: 0.7 mg/dL (ref 0.0–1.2)
Bilirubin, Direct: 0.24 mg/dL (ref 0.00–0.40)
Total Protein: 6.7 g/dL (ref 6.0–8.5)

## 2017-04-14 LAB — MAGNESIUM: Magnesium: 1.8 mg/dL (ref 1.6–2.3)

## 2017-04-18 ENCOUNTER — Encounter (HOSPITAL_COMMUNITY): Payer: Self-pay | Admitting: Psychiatry

## 2017-04-18 ENCOUNTER — Ambulatory Visit (INDEPENDENT_AMBULATORY_CARE_PROVIDER_SITE_OTHER): Payer: PRIVATE HEALTH INSURANCE | Admitting: Psychiatry

## 2017-04-18 VITALS — BP 114/76 | HR 100 | Ht 75.0 in | Wt 237.0 lb

## 2017-04-18 DIAGNOSIS — Z87891 Personal history of nicotine dependence: Secondary | ICD-10-CM

## 2017-04-18 DIAGNOSIS — F321 Major depressive disorder, single episode, moderate: Secondary | ICD-10-CM | POA: Diagnosis not present

## 2017-04-18 MED ORDER — ALPRAZOLAM 0.5 MG PO TABS: ORAL_TABLET | ORAL | 2 refills | Status: DC

## 2017-04-18 MED ORDER — SERTRALINE HCL 100 MG PO TABS: 100.0000 mg | ORAL_TABLET | Freq: Every day | ORAL | 2 refills | Status: DC

## 2017-04-18 NOTE — Progress Notes (Signed)
Patient ID: Jared Griffin, male   DOB: Apr 14, 1961, 56 y.o.   MRN: [id number] Patient ID: Jared Griffin, male   DOB: 1961/05/29, 56 y.o.   MRN: [id number]  Psychiatric  Adult follow-up  Patient Identification: Jared Griffin MRN:  [id number] Date of Evaluation:  04/18/2017 Referral Source: Dr. [name] Chief Complaint:   Chief Complaint    Depression; Anxiety; Follow-up     Visit Diagnosis:    ICD-10-CM   1. Moderate single current episode of major depressive disorder ([place]) F32.1    Diagnosis:   Patient Active Problem List   Diagnosis Date Noted  . Cervical nerve root impingement [G54.2] 12/09/2015  . Generalized anxiety disorder [F41.1] 09/27/2015  . Chest pain [R07.9] 09/20/2015  . Pain in the chest [R07.9]   . Depression [F32.9] 01/24/2015  . Esophageal reflux [K21.9] 01/24/2015  . Preoperative cardiovascular examination [Z01.810] 06/28/2014  . Type 2 diabetes mellitus ([place]) [E11.9]   . Mixed hyperlipidemia [E78.2]   . 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 Fatty liver disease, nonalcoholic [X32.4]   . Obesity [E66.9]   . Intermediate coronary syndrome ([place]) [I20.0] 12/09/2013  . Fatty liver [K76.0] 05/05/2013  . Lumbago [M54.5] 05/21/2012  . Essential hypertension, benign [I10] 12/28/2009  . CAD S/P LAD DES March 2015 [I25.10, Z98.61] 12/28/2009  . Hyperlipidemia LDL goal <70 [E78.5] 11/25/2009  . Accelerating angina ([place]) [I20.0] 11/25/2009  . DM [E11.9] 11/24/2009  . ABDOMINAL PAIN, HX OF [Z91.89] 11/24/2009   History of Present Illness:  This patient is a 56 year old married white male lives with his wife and 69 year old son in [place]. 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  The patient was referred by his primary physician, Dr. [name], for further assessment and treatment of anxiety and depression.  The patient states that he is become increasingly depressed and  anxious over the last couple of years due to work stress. He stated that he was always under pressure to get more things done than he had time to do. 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 He got to the point of hating going to work. Several years ago he had cardiac problems and had a stent placed. In January of this year he began developing dizzy spells chest pain headaches stomach aches. It got so bad that he was readmitted for cardiac catheterization but nothing new was found.  The patient was having difficulty sleeping constant worry and anxiety about his job sadness and anger. He was getting irritable with people at home. After talking to his primary care physician at length and decided to take him out on medical retirement. The patient has been on Wellbutrin SR 150 no gram twice a day for several weeks now. He had also been out in the past when his wife was sick. He has not seen any benefit from this but does feel tremendously better since he stopped working. He is also on Xanax 0.5 mg 3 times a day which he thinks has helped to some degree.  Since stopping working he's had a big turnaround. His sleep and energy have improved. 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 He is much less anxious and is no longer having the chest pain dizzy spells or stomachaches. He denies suicidal ideation or  psychotic symptoms. He does not use drugs or alcohol  The patient returns after 3 months. He has been a lot more stressed recently. His wife had back surgery November. A few months ago she lost sensation in her leg and fell. Shortly thereafter she lost sensation in the other leg and has been having the same problem with her arms. She is undergoing a workup for possible MS. She is not able to do much at home and he is taken over all the [employer] cooking etc. plus working full-time.  He's gotten very fatigued. Dr. Wolfgang Phoenix ran a bunch of lab work and everything went good. His blood sugar is a little bit elevated. He has cut back on the work at home and now he is feeling better. He is getting help from family members and church members. He doesn't want to give up his job at [employer] because he really enjoys it. The Xanax has helped with his irritability Elements:  Location:  Global. Quality:  Improving. Severity:  Moderate. Timing:  Daily. Duration:  Approximately 2 years. Context:  Stressful work environment. Associated Signs/Symptoms: Depression Symptoms:  depressed mood, anhedonia, psychomotor agitation, fatigue, difficulty concentrating, anxiety, loss of energy/fatigue, disturbed sleep, (Hypo) Manic Symptoms:  Irritable Mood, Anxiety Symptoms:  Excessive Worry,   Past Medical History:  Past Medical History:  Diagnosis Date  . Arthritis   . 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 Coronary atherosclerosis of native coronary artery    a. 12/09/2013: s/p PCI with 3.0 x 28 Promus DES to mLAD  . Essential hypertension, benign   . Fatty liver disease, nonalcoholic   . Mixed hyperlipidemia 2006  . Obesity   . Osgood-Schlatter's disease of right knee   . Type 2 diabetes mellitus ([place])     Past Surgical History:  Procedure Laterality Date  . CARDIAC CATHETERIZATION  2011  . CARDIAC CATHETERIZATION N/A 09/20/2015   Procedure: Left Heart Cath and Coronary Angiography;  Surgeon: [name], MD;  Location: [facility];  Service: Cardiovascular;  Laterality: N/A;  . CHOLECYSTECTOMY  2003  . COLONOSCOPY  06/19/2012   Procedure: COLONOSCOPY;  Surgeon: [name], MD;  [facility];  Service: Endoscopy;  Laterality: N/A;  930  . KNEE ARTHROPLASTY Right 1999  . LEFT HEART CATHETERIZATION WITH CORONARY ANGIOGRAM N/A 12/09/2013   Procedure: LEFT HEART CATHETERIZATION WITH CORONARY ANGIOGRAM;  Surgeon: [name], MD;   Location: [facility] CATH LAB;  Service: Cardiovascular;  Laterality: N/A;  . Liver biopsy    . TONSILLECTOMY  1970's   Family History:  Family History  Problem Relation Age of Onset  . Cancer Maternal Aunt   . CVA Maternal Grandmother   . CVA Maternal Grandfather    Social History:   Social History   Social History  . Marital status: Married    Spouse name: N/A  . Number of children: N/A  . Years of education: N/A   Social History Main Topics  . Smoking status: Former Smoker    Packs/day: 0.50    Years: 10.00    Types: Cigarettes    Start date: 02/02/1973    Quit date: 09/17/1983  . Smokeless tobacco: Former Systems developer    Types: [place] date: 09/17/1983  . Alcohol use No     Comment: 11-30-15 per pt no  . Drug use: No     Comment: 11-30-15 per pt no  . Sexual activity: Yes   Other  Topics Concern  . 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    Married with 1 child   Right handed    12 th    Very little caffeine   Additional Social History: The patient grew up primarily in [place] with both parents. He states he has another sister but she is not involved in the family and he has had little contact with her. He denies any history of abuse or trauma. 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 He is married and has one 38 year old son. His wife has had severe gastric reflux and has had to go on disability as well  Musculoskeletal: Strength & Muscle Tone: within normal limits Gait & Station: normal Patient leans: N/A  Psychiatric Specialty Exam: Depression         Past medical history includes anxiety.   Anxiety       Review of Systems  Psychiatric/Behavioral: Positive for depression.    Blood pressure 114/76, pulse 100, height 6' 3"  (1.905 m), weight 237 lb (107.5 kg).Body mass index is 29.62 kg/m.  General Appearance:  Casual, Neat and Well Groomed  Eye Contact:  Good  Speech:  Clear and Coherent  Volume:  Normal  Mood:  goodSomewhat anxious   Affect: Congruent     Orientation:  Full (Time, Place, and Person)  Thought Content:  WDL  Suicidal Thoughts:  No  Homicidal Thoughts:  No  Memory:  Immediate;   Good Recent;   Good Remote;   Good  Judgement:  Good  Insight:  Good  Psychomotor Activity:  Normal  Concentration:  Good  Recall:  Good  Fund of Knowledge:Good  Language: Good  Akathisia:  No  Handed:  Right  AIMS (if indicated):    Assets:  Communication Skills Desire for Improvement Resilience Social Support Talents/Skills  ADL's:  Intact  Cognition: WNL  Sleep:  ok   Is the patient at risk to self?  No. Has the patient been a risk to self in the past 6 months?  No. Has the patient been a risk to self within the distant past?  No. Is the patient a risk to others?  No. Has the patient been a risk to others in the past 6 months?  No. Has the patient been a risk to others within the distant past?  No.  Allergies:   Allergies  Allergen Reactions  . Metoprolol Rash   Current Medications: Current Outpatient Prescriptions  Medication Sig Dispense Refill  . ACCU-CHEK AVIVA PLUS test strip CHECK BLOOD SUGAR UP TO FOUR TIMES A DAY. 100 each 5  . acetaminophen (TYLENOL) 500 MG tablet Take 1,000 mg by mouth daily as needed for headache. Pain.    Marland Kitchen ALPRAZolam (XANAX) 0.5 MG tablet Take 1 tablet three times a day as needed 90 tablet 2  . aspirin (ASPIR-LOW) 81 MG EC tablet Take 81 mg by mouth daily.     Marland Kitchen atorvastatin (LIPITOR) 40 MG tablet TAKE ONE (1) TABLET BY MOUTH EVERY DAY 30 tablet 6  . canagliflozin (INVOKANA) 300 MG TABS tablet TAKE BY MOUTH 1 TABLET EVERY DAY BEFORE BREAKFAST 30 tablet 5  . diltiazem (CARDIZEM) 30 MG tablet Take 1 tablet (30 mg total) by mouth 2 (two) times daily. 60 tablet 5  . gabapentin (NEURONTIN) 300 MG capsule Take 600 mg by mouth  2 (two) times daily.      Marland Kitchen glipiZIDE (GLUCOTROL) 5 MG tablet Take 2 tablets by mouth twice a daily. 120 tablet 5  . HYDROcodone-acetaminophen (NORCO) 7.5-325 MG tablet Take 1 tablet by mouth every 4 (four) hours as needed for moderate pain. 30 tablet 0  . metFORMIN (GLUCOPHAGE) 500 MG tablet TAKE TWO TABLET EVERY MORNING, THEN ONE TABLET AT NOON, THEN TWO TABLETS EVERY EVENING 450 tablet 1  . nitroGLYCERIN (NITROSTAT) 0.4 MG SL tablet Place 1 tablet (0.4 mg total) under the tongue every 5 (five) minutes as needed for chest pain. 25 tablet 3  . ondansetron (ZOFRAN) 4 MG tablet Take 1 tablet (4 mg total) by mouth every 8 (eight) hours as needed for nausea or vomiting. 8 tablet 0  . pantoprazole (PROTONIX) 40 MG tablet TAKE ONE TABLET BY MOUTH ONCE DAILY. 30 tablet 5  . RANEXA 500 MG 12 hr tablet TAKE ONE TABLET TWICE DAILY 60 tablet 6  . ranitidine (ZANTAC) 150 MG tablet Take 150 mg by mouth daily as needed for heartburn.    . sertraline (ZOLOFT) 100 MG tablet Take 1 tablet (100 mg total) by mouth daily. 30 tablet 2   No current facility-administered medications for this visit.     Previous Psychotropic Medications: Yes   Substance Abuse History in the last 12 months:  No.  Consequences of Substance Abuse: NA  Medical Decision Making:  Review of Psycho-Social Stressors (1), Review or order clinical lab tests (1), Review and summation of old records (2), Review of Medication Regimen & Side Effects (2) and Review of New Medication or Change in Dosage (2)  Treatment Plan Summary: Medication management  The patient will continueZoloft  100 mg daily for depression and anxiety. He'll continue Xanax 0.5 mg up to 3 times a day for anxiety as well.He'll return to see me in 3 months.Therapy was offered but he declined today    [place], [facility] 8/2/201811:53 AM

## 2017-05-14 ENCOUNTER — Ambulatory Visit: Payer: Self-pay | Admitting: Orthopedic Surgery

## 2017-05-15 ENCOUNTER — Other Ambulatory Visit: Payer: Self-pay | Admitting: *Deleted

## 2017-05-15 ENCOUNTER — Ambulatory Visit (INDEPENDENT_AMBULATORY_CARE_PROVIDER_SITE_OTHER): Payer: Worker's Compensation | Admitting: Orthopedic Surgery

## 2017-05-15 ENCOUNTER — Encounter: Payer: Self-pay | Admitting: Orthopedic Surgery

## 2017-05-15 ENCOUNTER — Telehealth: Payer: Self-pay | Admitting: Orthopedic Surgery

## 2017-05-15 DIAGNOSIS — M7711 Lateral epicondylitis, right elbow: Secondary | ICD-10-CM | POA: Diagnosis not present

## 2017-05-15 MED ORDER — LOSARTAN POTASSIUM 25 MG PO TABS: 25.0000 mg | ORAL_TABLET | Freq: Every day | ORAL | 1 refills | Status: DC

## 2017-05-15 NOTE — Telephone Encounter (Signed)
Patient called with information as discussed with Dr [name] at today's office visit - the topical gel is: Diclofenac Sodium topical gel - states would need to be ordered through [facility], [name].

## 2017-05-15 NOTE — Progress Notes (Signed)
Progress Note   Patient ID: Jared Griffin, male   DOB: [birth year], 56 y.o.   MRN: [id number]  Chief Complaint  Patient presents with  . Elbow Problem    rt tennis elbow     56 year old male who had a Worker's Comp. related injury presents back after physical therapy he did get improvement in terms of range of motion but he still has lateral elbow pain  His MRIs showed a torn lateral extensor mechanism at the elbow. He cannot take NSAIDs because of a cardiac stent he did have a cortisone injection which did not help  He complains of a moderately severe dull lateral right elbow pain not associated with any numbness or tingling he had it now for several months with no improvement with measures listed above.   Past Medical History:  Diagnosis Date  . Arthritis   . 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 Coronary atherosclerosis of native coronary artery    a. 12/09/2013: s/p PCI with 3.0 x 28 Promus DES to mLAD  . Essential hypertension, benign   . Fatty liver disease, nonalcoholic   . Mixed hyperlipidemia 2006  . Obesity   . Osgood-Schlatter's disease of right knee   . Type 2 diabetes mellitus ([place])      Review of Systems  Respiratory: Negative for shortness of breath.   Cardiovascular: Negative for chest pain.  Neurological: Negative for tingling.     Physical Exam There were no vitals taken for this visit.  Gen. appearance the patient's appearance is normal with normal grooming and  hygiene The patient is oriented to person place and time Mood and affect are normal Normal ambulatory status  Ortho Exam  Motor exam 5/5 manual muscle testing , no atrophy in the right and left elbow Skin is normal (no rash or erythema) right and left arm ROM RIGHT ELBOW VS LEFT ELBOW  FLEXION =  EXT 5/0  PROM SUP FULL NORMAL,= Radial pulse normal, normal sensation right upper extremity and left upper extremity  Medical decision-making Diagnosis, Data, Plan  (risk)  Encounter Diagnosis  Name Primary?  . Right tennis elbow Yes    Recommend repeat MRI with contrast to assess healing of the right tennis elbow.  The patient would benefit from a pharmaceutical topical cocktail because he is unable to take NSAIDs secondary to cardiac stent.   Arther Abbott, MD 05/15/2017 9:33 AM

## 2017-05-15 NOTE — Telephone Encounter (Signed)
Jared Commons, MD  Johniya Durfee T, CMA        Yes, I prescribed on 09/18/16.   Previous Messages    ----- Message -----  From: Massie Maroon, CMA  Sent: 05/15/2017  1:33 PM  To: Jared Commons, MD   Is this pt supposed to be taking Losartan 25 mg qd?

## 2017-05-16 NOTE — Telephone Encounter (Signed)
Routing to Dr Harrison 

## 2017-05-16 NOTE — Progress Notes (Signed)
Patient ID: Jared Griffin, male   DOB: October 14, 1960, 56 y.o.   MRN: [id number]

## 2017-05-16 NOTE — Telephone Encounter (Signed)
Ok   I m going to order a topical concoction for him on friday

## 2017-05-23 ENCOUNTER — Other Ambulatory Visit: Payer: Self-pay | Admitting: *Deleted

## 2017-05-23 ENCOUNTER — Telehealth: Payer: Self-pay | Admitting: *Deleted

## 2017-05-23 ENCOUNTER — Other Ambulatory Visit: Payer: Self-pay | Admitting: Cardiovascular Disease

## 2017-05-23 DIAGNOSIS — M25521 Pain in right elbow: Secondary | ICD-10-CM

## 2017-05-23 DIAGNOSIS — M7711 Lateral epicondylitis, right elbow: Secondary | ICD-10-CM

## 2017-05-23 NOTE — Telephone Encounter (Signed)
Received call from [facility] with [facility] and she said they need the order for the MRI to be with and without contrast. She has it approved with workers comp but needs your approval and a new signed order faxed to [phone] Phone # [phone] option 5.

## 2017-05-23 NOTE — Telephone Encounter (Signed)
Ok  Go ahead and do it let me know if I need to do anything

## 2017-05-28 NOTE — Telephone Encounter (Signed)
Faxed order to [facility]

## 2017-05-30 ENCOUNTER — Other Ambulatory Visit: Payer: Self-pay | Admitting: Family Medicine

## 2017-05-30 NOTE — Telephone Encounter (Signed)
Has the topical medication been ordered as noted through [facility], Linna Hoff

## 2017-06-10 ENCOUNTER — Other Ambulatory Visit: Payer: Self-pay

## 2017-06-10 ENCOUNTER — Telehealth: Payer: Self-pay

## 2017-06-10 NOTE — Telephone Encounter (Signed)
Per Jody at [facility], image needs to be arthrogram not with and without.  Left message for workers comp IT consultant to update change.

## 2017-06-14 ENCOUNTER — Encounter (INDEPENDENT_AMBULATORY_CARE_PROVIDER_SITE_OTHER): Payer: Self-pay | Admitting: *Deleted

## 2017-06-14 ENCOUNTER — Ambulatory Visit (INDEPENDENT_AMBULATORY_CARE_PROVIDER_SITE_OTHER): Payer: Worker's Compensation | Admitting: Orthopedic Surgery

## 2017-06-14 ENCOUNTER — Encounter: Payer: Self-pay | Admitting: Orthopedic Surgery

## 2017-06-14 VITALS — BP 110/77 | HR 93 | Ht 75.0 in | Wt 237.0 lb

## 2017-06-14 DIAGNOSIS — M7711 Lateral epicondylitis, right elbow: Secondary | ICD-10-CM

## 2017-06-14 NOTE — Progress Notes (Signed)
REGULAR FOLLOW UP   MRI contrast joint RIGHT ELBOW   PRIOR HISTORY:  56 year old male who had a Worker's Comp. related injury presents back after physical therapy he did get improvement in terms of range of motion but he still has lateral elbow pain   His MRIs showed a torn lateral extensor mechanism at the elbow. He cannot take NSAIDs because of a cardiac stent he did have a cortisone injection which did not help   He complains of a moderately severe dull lateral right elbow pain not associated with any numbness or tingling he had it now for several months with no improvement with measures listed above.   Since we saw him last pain has persisted if not worsened  Review of Systems  Constitutional: Negative.   Respiratory: Negative.   Cardiovascular: Negative.   Musculoskeletal: Positive for myalgias.  Neurological: Negative for tingling and sensory change.    Past Medical History:  Diagnosis Date  . Arthritis   . 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 Coronary atherosclerosis of native coronary artery    a. 12/09/2013: s/p PCI with 3.0 x 28 Promus DES to mLAD  . Essential hypertension, benign   . Fatty liver disease, nonalcoholic   . Mixed hyperlipidemia 2006  . Obesity   . Osgood-Schlatter's disease of right knee   . Type 2 diabetes mellitus ([place])     Past Surgical History:  Procedure Laterality Date  . CARDIAC CATHETERIZATION  2011  . CARDIAC CATHETERIZATION N/A 09/20/2015   Procedure: Left Heart Cath and Coronary Angiography;  Surgeon: [name], MD;  Location: [facility];  Service: Cardiovascular;  Laterality: N/A;  . CHOLECYSTECTOMY  2003  . COLONOSCOPY  06/19/2012   Procedure: COLONOSCOPY;  Surgeon: [name], MD;  [facility];  Service: Endoscopy;  Laterality: N/A;  930  . KNEE ARTHROPLASTY Right 1999  . LEFT HEART CATHETERIZATION WITH CORONARY ANGIOGRAM N/A 12/09/2013   Procedure: LEFT HEART CATHETERIZATION WITH  CORONARY ANGIOGRAM;  Surgeon: [name], MD;  Location: [facility] CATH LAB;  Service: Cardiovascular;  Laterality: N/A;  . Liver biopsy    . TONSILLECTOMY  1970's      BP 110/77   Pulse 93   Ht 6' 3"  (1.905 m)   Wt 237 lb (107.5 kg)   BMI 29.62 kg/m   Physical Exam  Constitutional: He is oriented to person, place, and time. He appears well-developed and well-nourished.  Vital signs have been reviewed and are stable. Gen. appearance the patient is well-developed and well-nourished with normal grooming and hygiene.   Musculoskeletal:  GAIT IS NORMAL   Neurological: He is alert and oriented to person, place, and time.  Skin: Skin is warm and dry. No erythema.  Psychiatric: He has a normal mood and affect.  Vitals reviewed.  Examination reveals somewhat painful range of motion extension right elbow full flexion mild pain with pronation supination tenderness over the lateral epicondyle mild swelling. Stability tests were normal motor exam is normal painful wrist extension was noted especially with elbow extended skin was intact neurovascular exam is normal the wrist or epitrochlear lymph nodes  Gait and station again were normal  MRI  REPORT  Report was done and dictated on September 26 summation no acute fractures biceps triceps intact common extensor tendons mild common extensor tendon origin tendinosis ligaments normal cubital tunnel ulnar nerve normal mild triceps edema  I reviewed the films and there is no tear of the extensor  mechanism of the elbow he does have tendinosis consistent with lateral epicondylitis common extensor origin   He has had an adequate course of physical therapy as well as injection and time for resolution. He would benefit from [place]  The procedure has been fully reviewed with the patient; The risks and benefits of surgery have been discussed and explained and understood. Alternative treatment has also been reviewed,  questions were encouraged and answered. The postoperative plan is also been reviewed.

## 2017-06-14 NOTE — Patient Instructions (Addendum)
Tennis Elbow Tennis elbow (lateral epicondylitis) is inflammation of the outer tendons of your forearm close to your elbow. Your tendons attach your muscles to your bones. The outer tendons of your forearm are used to extend your wrist, and they attach on the outside part of your elbow. Tennis elbow is often found in people who play tennis, but anyone may get the condition from repeatedly extending the wrist or turning the forearm. What are the causes? This condition is caused by repeatedly extending your wrist and using your hands. It can result from sports or work that requires repetitive forearm movements. Tennis elbow may also be caused by an injury. What increases the risk? You have a higher risk of developing tennis elbow if you play tennis or another racquet sport. You also have a higher risk if you frequently use your hands for work. This condition is also more likely to develop in:  Musicians.  Carpenters, painters, and plumbers.  Cooks.  Cashiers.  People who work in factories.  Construction workers.  Butchers.  People who use computers.  What are the signs or symptoms? Symptoms of this condition include:  Pain and tenderness in your forearm and the outer part of your elbow. You may only feel the pain when you use your arm, or you may feel it even when you are not using your arm.  A burning feeling that runs from your elbow through your arm.  Weak grip in your hands.  How is this diagnosed? This condition may be diagnosed by medical history and physical exam. You may also have other tests, including:  X-rays.  MRI.  How is this treated? Your health care provider will recommend lifestyle adjustments, such as resting and icing your arm. Treatment may also include:  Medicines for inflammation. This may include shots of cortisone if your pain continues.  Physical therapy. This may include massage or exercises.  An elbow brace.  Surgery may eventually be  recommended if your pain does not go away with treatment. Follow these instructions at home: Activity  Rest your elbow and wrist as directed by your health care provider. Try to avoid any activities that caused the problem until your health care provider says that you can do them again.  If a physical therapist teaches you exercises, do all of them as directed.  If you lift an object, lift it with your palm facing upward. This lowers the stress on your elbow. Lifestyle  If your tennis elbow is caused by sports, check your equipment and make sure that: ? You are using it correctly. ? It is the best fit for you.  If your tennis elbow is caused by work, take breaks frequently, if you are able. Talk with your manager about how to best perform tasks in a way that is safe. ? If your tennis elbow is caused by computer use, talk with your manager about any changes that can be made to your work environment. General instructions  If directed, apply ice to the painful area: ? Put ice in a plastic bag. ? Place a towel between your skin and the bag. ? Leave the ice on for 20 minutes, 2-3 times per day.  Take medicines only as directed by your health care provider.  If you were given a brace, wear it as directed by your health care provider.  Keep all follow-up visits as directed by your health care provider. This is important. Contact a health care provider if:  Your pain does not   get better with treatment.  Your pain gets worse.  You have numbness or weakness in your forearm, hand, or fingers. This information is not intended to replace advice given to you by your health care provider. Make sure you discuss any questions you have with your health care provider. Document Released: 09/03/2005 Document Revised: 05/03/2016 Document Reviewed: 08/30/2014 Elsevier Interactive Patient Education  2018 Elsevier Inc.  

## 2017-06-17 ENCOUNTER — Telehealth: Payer: Self-pay | Admitting: Orthopedic Surgery

## 2017-06-17 NOTE — Telephone Encounter (Signed)
-----   Message from [name], MD sent at 06/14/2017  9:43 AM EDT ----- Regarding: WORKERS COMP  NEEDS APPROVAL FOR TENNIS ELBOW RELEASE

## 2017-06-17 NOTE — Telephone Encounter (Signed)
Contacted and Faxed notes to workers comp insurer, [employer], attention (new) case manager/contact: Durenda Hurt, ph # [phone], fax # [phone], requesting pre-authorization/approval for CPT code [id number], tennis elbow release, right elbow. Patient aware of status.

## 2017-06-24 ENCOUNTER — Other Ambulatory Visit: Payer: Self-pay | Admitting: Family Medicine

## 2017-06-24 NOTE — Telephone Encounter (Signed)
As of Friday, 06/21/17, called Workers comp, Jared Griffin, left message for [employer], direct ph # [phone]. Also called patient - states he has had no response. As of Monday, 06/24/17, received response from [place]; states did not receive. Contact information verified; RE-FAXED and called back to request call back.

## 2017-07-02 NOTE — Telephone Encounter (Signed)
Routing to Dr. [name] to advise when to schedule

## 2017-07-02 NOTE — Telephone Encounter (Signed)
Called back to Workers comp, [employer] adjuster Jaquita Rector - surgery is approved (CPT [phone] [id number]). Please advise as to available surgery date, pre-op, post op. Patient is aware of approved status.  He requests at least a month out due to having to give employer advance notice.

## 2017-07-03 ENCOUNTER — Other Ambulatory Visit: Payer: Self-pay | Admitting: Family Medicine

## 2017-07-03 NOTE — Addendum Note (Signed)
Addended by: Arther Abbott E on: 07/03/2017 10:10 AM   Modules accepted: Orders, SmartSet

## 2017-07-03 NOTE — Telephone Encounter (Signed)
I scheduled for nov 8

## 2017-07-03 NOTE — Telephone Encounter (Signed)
Patient informed of surgery and pre-op dates and times.

## 2017-07-06 ENCOUNTER — Other Ambulatory Visit: Payer: Self-pay | Admitting: Family Medicine

## 2017-07-15 ENCOUNTER — Ambulatory Visit: Payer: PRIVATE HEALTH INSURANCE | Admitting: Family Medicine

## 2017-07-17 NOTE — Patient Instructions (Signed)
Jared Griffin  07/17/2017     @PREFPERIOPPHARMACY @   Your procedure is scheduled on  07/25/2017 .  Report to [facility] at  [place]   A.M.  Call this number if you have problems the morning of surgery:  [phone]   Remember:  Do not eat food or drink liquids after midnight.  Take these medicines the morning of surgery with A SIP OF WATER  Xanax, cardiazem, neurontin, hydrocodoen, losartan, zofran, protonix, ranexa, zoloft.   Do not wear jewelry, make-up or nail polish.  Do not wear lotions, powders, or perfumes, or deoderant.  Do not shave 48 hours prior to surgery.  Men may shave face and neck.  Do not bring valuables to the hospital.  [facility] is not responsible for any belongings or valuables.  Contacts, dentures or bridgework may not be worn into surgery.  Leave your suitcase in the car.  After surgery it may be brought to your room.  For patients admitted to the hospital, discharge time will be determined by your treatment team.  Patients discharged the day of surgery will not be allowed to drive home.   Name and phone number of your driver:   family Special instructions:  None  Please read over the following fact sheets that you were given. Anesthesia Post-op Instructions and Care and Recovery After Surgery       Incision Care, Adult An incision is a cut that a doctor makes in your skin for surgery (for a procedure). Most times, these cuts are closed after surgery. Your cut from surgery may be closed with stitches (sutures), staples, skin glue, or skin tape (adhesive strips). You may need to return to your doctor to have stitches or staples taken out. This may happen many days or many weeks after your surgery. The cut needs to be well cared for so it does not get infected. How to care for your cut Cut care  Follow instructions from your doctor about how to take care of your cut. Make sure you: ? Wash your hands with soap and water before you  change your bandage (dressing). If you cannot use soap and water, use hand sanitizer. ? Change your bandage as told by your doctor. ? Leave stitches, skin glue, or skin tape in place. They may need to stay in place for 2 weeks or longer. If tape strips get loose and curl up, you may trim the loose edges. Do not remove tape strips completely unless your doctor says it is okay.  Check your cut area every day for signs of infection. Check for: ? More redness, swelling, or pain. ? More fluid or blood. ? Warmth. ? Pus or a bad smell.  Ask your doctor how to clean the cut. This may include: ? Using mild soap and water. ? Using a clean towel to pat the cut dry after you clean it. ? Putting a cream or ointment on the cut. Do this only as told by your doctor. ? Covering the cut with a clean bandage.  Ask your doctor when you can leave the cut uncovered.  Do not take baths, swim, or use a hot tub until your doctor says it is okay. Ask your doctor if you can take showers. You may only be allowed to take sponge baths for bathing. Medicines  If you were prescribed an antibiotic medicine, cream, or ointment, take the antibiotic or put it on the cut as told  by your doctor. Do not stop taking or putting on the antibiotic even if your condition gets better.  Take over-the-counter and prescription medicines only as told by your doctor. General instructions  Limit movement around your cut. This helps healing. ? Avoid straining, lifting, or exercise for the first month, or for as long as told by your doctor. ? Follow instructions from your doctor about going back to your normal activities. ? Ask your doctor what activities are safe.  Protect your cut from the sun when you are outside for the first 6 months, or for as long as told by your doctor. Put on sunscreen around the scar or cover up the scar.  Keep all follow-up visits as told by your doctor. This is important. Contact a doctor if:  Your have  more redness, swelling, or pain around the cut.  You have more fluid or blood coming from the cut.  Your cut feels warm to the touch.  You have pus or a bad smell coming from the cut.  You have a fever or shaking chills.  You feel sick to your stomach (nauseous) or you throw up (vomit).  You are dizzy.  Your stitches or staples come undone. Get help right away if:  You have a red streak coming from your cut.  Your cut bleeds through the bandage and the bleeding does not stop with gentle pressure.  The edges of your cut open up and separate.  You have very bad (severe) pain.  You have a rash.  You are confused.  You pass out (faint).  You have trouble breathing and you have a fast heartbeat. This information is not intended to replace advice given to you by your health care provider. Make sure you discuss any questions you have with your health care provider. Document Released: 11/26/2011 Document Revised: 05/11/2016 Document Reviewed: 05/11/2016 Elsevier Interactive Patient Education  2017 Elsevier Inc.  Tennis Elbow Tennis elbow is puffiness (inflammation) of the outer tendons of your forearm close to your elbow. Your tendons attach your muscles to your bones. Tennis elbow can happen in any sport or job in which you use your elbow too much. It is caused by doing the same motion over and over. Tennis elbow can cause:  Pain and tenderness in your forearm and the outer part of your elbow.  A burning feeling. This runs from your elbow through your arm.  Weak grip in your hands.  Follow these instructions at home: Activity  Rest your elbow and wrist as told by your doctor. Try to avoid any activities that caused the problem until your doctor says that you can do them again.  If a physical therapist teaches you exercises, do all of them as told.  If you lift an object, lift it with your palm facing up. This is easier on your elbow. Lifestyle  If your tennis elbow is  caused by sports, check your equipment and make sure that: ? You are using it correctly. ? It fits you well.  If your tennis elbow is caused by work, take breaks often, if you are able. Talk with your manager about doing your work in a way that is safe for you. ? If your tennis elbow is caused by computer use, talk with your manager about any changes that can be made to your work setup. General instructions  If told, apply ice to the painful area: ? Put ice in a plastic bag. ? Place a towel between your skin  and the bag. ? Leave the ice on for 20 minutes, 2-3 times per day.  Take medicines only as told by your doctor.  If you were given a brace, wear it as told by your doctor.  Keep all follow-up visits as told by your doctor. This is important. Contact a doctor if:  Your pain does not get better with treatment.  Your pain gets worse.  You have weakness in your forearm, hand, or fingers.  You cannot feel your forearm, hand, or fingers. This information is not intended to replace advice given to you by your health care provider. Make sure you discuss any questions you have with your health care provider. Document Released: 02/21/2010 Document Revised: 05/03/2016 Document Reviewed: 08/30/2014 Elsevier Interactive Patient Education  2018 [place] Anesthesia is a term that refers to techniques, procedures, and medicines that help a person stay safe and comfortable during a medical procedure. Monitored anesthesia care, or sedation, is one type of anesthesia. Your anesthesia specialist may recommend sedation if you will be having a procedure that does not require you to be unconscious, such as:  Cataract surgery.  A dental procedure.  A biopsy.  A colonoscopy.  During the procedure, you may receive a medicine to help you relax (sedative). There are three levels of sedation:  Mild sedation. At this level, you may feel awake and relaxed. You will  be able to follow directions.  Moderate sedation. At this level, you will be sleepy. You may not remember the procedure.  Deep sedation. At this level, you will be asleep. You will not remember the procedure.  The more medicine you are given, the deeper your level of sedation will be. Depending on how you respond to the procedure, the anesthesia specialist may change your level of sedation or the type of anesthesia to fit your needs. An anesthesia specialist will monitor you closely during the procedure. Let your health care provider know about:  Any allergies you have.  All medicines you are taking, including vitamins, herbs, eye drops, creams, and over-the-counter medicines.  Any use of steroids (by mouth or as a cream).  Any problems you or family members have had with sedatives and anesthetic medicines.  Any blood disorders you have.  Any surgeries you have had.  Any medical conditions you have, such as sleep apnea.  Whether you are pregnant or may be pregnant.  Any use of cigarettes, alcohol, or street drugs. What are the risks? Generally, this is a safe procedure. However, problems may occur, including:  Getting too much medicine (oversedation).  Nausea.  Allergic reaction to medicines.  Trouble breathing. If this happens, a breathing tube may be used to help with breathing. It will be removed when you are awake and breathing on your own.  Heart trouble.  Lung trouble.  Before the procedure Staying hydrated Follow instructions from your health care provider about hydration, which may include:  Up to 2 hours before the procedure - you may continue to drink clear liquids, such as water, clear fruit juice, black coffee, and plain tea.  Eating and drinking restrictions Follow instructions from your health care provider about eating and drinking, which may include:  8 hours before the procedure - stop eating heavy meals or foods such as meat, fried foods, or fatty  foods.  6 hours before the procedure - stop eating light meals or foods, such as toast or cereal.  6 hours before the procedure - stop drinking milk or  drinks that contain milk.  2 hours before the procedure - stop drinking clear liquids.  Medicines Ask your health care provider about:  Changing or stopping your regular medicines. This is especially important if you are taking diabetes medicines or blood thinners.  Taking medicines such as aspirin and ibuprofen. These medicines can thin your blood. Do not take these medicines before your procedure if your health care provider instructs you not to.  Tests and exams  You will have a physical exam.  You may have blood tests done to show: ? How well your kidneys and liver are working. ? How well your blood can clot.  General instructions  Plan to have someone take you home from the hospital or clinic.  If you will be going home right after the procedure, plan to have someone with you for 24 hours.  What happens during the procedure?  Your blood pressure, heart rate, breathing, level of pain and overall condition will be monitored.  An IV tube will be inserted into one of your veins.  Your anesthesia specialist will give you medicines as needed to keep you comfortable during the procedure. This may mean changing the level of sedation.  The procedure will be performed. After the procedure  Your blood pressure, heart rate, breathing rate, and blood oxygen level will be monitored until the medicines you were given have worn off.  Do not drive for 24 hours if you received a sedative.  You may: ? Feel sleepy, clumsy, or nauseous. ? Feel forgetful about what happened after the procedure. ? Have a sore throat if you had a breathing tube during the procedure. ? Vomit. This information is not intended to replace advice given to you by your health care provider. Make sure you discuss any questions you have with your health care  provider. Document Released: 05/30/2005 Document Revised: 02/10/2016 Document Reviewed: 12/25/2015 Elsevier Interactive Patient Education  2018 [place], Care After These instructions provide you with information about caring for yourself after your procedure. Your health care provider may also give you more specific instructions. Your treatment has been planned according to current medical practices, but problems sometimes occur. Call your health care provider if you have any problems or questions after your procedure. What can I expect after the procedure? After your procedure, it is common to:  Feel sleepy for several hours.  Feel clumsy and have poor balance for several hours.  Feel forgetful about what happened after the procedure.  Have poor judgment for several hours.  Feel nauseous or vomit.  Have a sore throat if you had a breathing tube during the procedure.  Follow these instructions at home: For at least 24 hours after the procedure:   Do not: ? Participate in activities in which you could fall or become injured. ? Drive. ? Use heavy machinery. ? Drink alcohol. ? Take sleeping pills or medicines that cause drowsiness. ? Make important decisions or sign legal documents. ? Take care of children on your own.  Rest. Eating and drinking  Follow the diet that is recommended by your health care provider.  If you vomit, drink water, juice, or soup when you can drink without vomiting.  Make sure you have little or no nausea before eating solid foods. General instructions  Have a responsible adult stay with you until you are awake and alert.  Take over-the-counter and prescription medicines only as told by your health care provider.  If you smoke, do not smoke  without supervision.  Keep all follow-up visits as told by your health care provider. This is important. Contact a health care provider if:  You keep feeling nauseous or you  keep vomiting.  You feel light-headed.  You develop a rash.  You have a fever. Get help right away if:  You have trouble breathing. This information is not intended to replace advice given to you by your health care provider. Make sure you discuss any questions you have with your health care provider. Document Released: 12/25/2015 Document Revised: 04/25/2016 Document Reviewed: 12/25/2015 Elsevier Interactive Patient Education  [employer].

## 2017-07-18 ENCOUNTER — Encounter: Payer: Self-pay | Admitting: Orthopedic Surgery

## 2017-07-19 ENCOUNTER — Ambulatory Visit (HOSPITAL_COMMUNITY): Payer: Self-pay | Admitting: Psychiatry

## 2017-07-23 ENCOUNTER — Encounter (HOSPITAL_COMMUNITY): Payer: Self-pay

## 2017-07-23 ENCOUNTER — Encounter (HOSPITAL_COMMUNITY)
Admission: RE | Admit: 2017-07-23 | Discharge: 2017-07-23 | Disposition: A | Discharge status: Home / Self Care | Payer: Worker's Compensation | Source: Ambulatory Visit | Attending: Orthopedic Surgery | Admitting: Orthopedic Surgery

## 2017-07-23 ENCOUNTER — Other Ambulatory Visit: Payer: Self-pay

## 2017-07-23 DIAGNOSIS — I10 Essential (primary) hypertension: Secondary | ICD-10-CM | POA: Diagnosis not present

## 2017-07-23 DIAGNOSIS — Z6829 Body mass index (BMI) 29.0-29.9, adult: Secondary | ICD-10-CM | POA: Diagnosis not present

## 2017-07-23 DIAGNOSIS — I2510 Atherosclerotic heart disease of native coronary artery without angina pectoris: Secondary | ICD-10-CM | POA: Diagnosis not present

## 2017-07-23 DIAGNOSIS — Z87891 Personal history of nicotine dependence: Secondary | ICD-10-CM | POA: Diagnosis not present

## 2017-07-23 DIAGNOSIS — M7711 Lateral epicondylitis, right elbow: Secondary | ICD-10-CM | POA: Diagnosis not present

## 2017-07-23 DIAGNOSIS — E119 Type 2 diabetes mellitus without complications: Secondary | ICD-10-CM | POA: Diagnosis not present

## 2017-07-23 DIAGNOSIS — E782 Mixed hyperlipidemia: Secondary | ICD-10-CM | POA: Diagnosis not present

## 2017-07-23 DIAGNOSIS — M1990 Unspecified osteoarthritis, unspecified site: Secondary | ICD-10-CM | POA: Diagnosis not present

## 2017-07-23 DIAGNOSIS — E669 Obesity, unspecified: Secondary | ICD-10-CM | POA: Diagnosis not present

## 2017-07-23 HISTORY — DX: Major depressive disorder, single episode, unspecified: F32.9

## 2017-07-23 HISTORY — DX: Anxiety disorder, unspecified: F41.9

## 2017-07-23 HISTORY — DX: Depression, unspecified: F32.A

## 2017-07-23 LAB — BASIC METABOLIC PANEL
Anion gap: 16 — ABNORMAL HIGH (ref 5–15)
BUN: 19 mg/dL (ref 6–20)
CO2: 20 mmol/L — ABNORMAL LOW (ref 22–32)
Calcium: 9.9 mg/dL (ref 8.9–10.3)
Chloride: 99 mmol/L — ABNORMAL LOW (ref 101–111)
Creatinine, Ser: 0.95 mg/dL (ref 0.61–1.24)
GFR calc Af Amer: >60 mL/min (ref >60)
GFR calc non Af Amer: >60 mL/min (ref >60)
Glucose, Bld: 146 mg/dL — ABNORMAL HIGH (ref 65–99)
Potassium: 3.4 mmol/L — ABNORMAL LOW (ref 3.5–5.1)
Sodium: 135 mmol/L (ref 135–145)

## 2017-07-23 LAB — CBC WITH DIFFERENTIAL/PLATELET
Basophils Absolute: 0.1 10*3/uL (ref 0.0–0.1)
Basophils Relative: 1 %
Eosinophils Absolute: 0.2 10*3/uL (ref 0.0–0.7)
Eosinophils Relative: 2 %
HCT: 45.9 % (ref 39.0–52.0)
Hemoglobin: 16.1 g/dL (ref 13.0–17.0)
Lymphocytes Relative: 40 %
Lymphs Abs: 3.1 10*3/uL (ref 0.7–4.0)
MCH: 30.1 pg (ref 26.0–34.0)
MCHC: 35.1 g/dL (ref 30.0–36.0)
MCV: 85.8 fL (ref 78.0–100.0)
Monocytes Absolute: 0.6 10*3/uL (ref 0.1–1.0)
Monocytes Relative: 8 %
Neutro Abs: 3.9 10*3/uL (ref 1.7–7.7)
Neutrophils Relative %: 49 %
Platelets: 173 10*3/uL (ref 150–400)
RBC: 5.35 MIL/uL (ref 4.22–5.81)
RDW: 13.9 % (ref 11.5–15.5)
WBC: 7.9 10*3/uL (ref 4.0–10.5)

## 2017-07-23 LAB — HEMOGLOBIN A1C
Hgb A1c MFr Bld: 8.5 % — ABNORMAL HIGH (ref 4.8–5.6)
Mean Plasma Glucose: 197.25 mg/dL

## 2017-07-23 LAB — GLUCOSE, CAPILLARY: Glucose-Capillary: 154 mg/dL — ABNORMAL HIGH (ref 65–99)

## 2017-07-24 MED ORDER — FAT EMULSION 20 % IV EMUL
INTRAVENOUS | Status: AC
  Filled 2017-07-24: qty 100

## 2017-07-24 NOTE — H&P (Signed)
NEW PATIENT OFFICE VISIT      Chief Complaint  Patient presents with  . Elbow Pain      RIGHT ELBOW/FOREARM PAIN      56 year old male who was involved in a Worker's Comp. related problem with his right elbow. He works at [employer] zone. 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 He did get some improvement in symptoms but one week later he lost full range of motion in his elbow and had painful lifting including simple things such as a cup coffee. His pain is dull it's over his lateral epicondyle radiates proximally and distally when severe. He did try some ibuprofen but he did tolerate that well and he is a cardiac survivor and it was advised that he cannot take NSAIDs. He did try some cream over the elbow but that did not help. He eventually had x-ray which was normal he had an MRI which showed multiple chronic findings and symptoms acute on chronic findings such as bicipital tendinitis ulnar collateral ligament strain but more importantly partial tearing of his common flexor tendon origin which is the site of primary discomfort as well as over the triceps which did show some tendinitis       Review of Systems  Constitutional: Negative for chills and fever.  Cardiovascular: Positive for chest pain.  Skin: Negative for itching and rash.            Past Medical History:  Diagnosis Date  . Arthritis    . 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 Coronary atherosclerosis of native coronary artery      a. 12/09/2013: s/p PCI with 3.0 x 28 Promus DES to mLAD  . Essential hypertension, benign    . Fatty liver disease, nonalcoholic    . Mixed hyperlipidemia 2006  . Obesity    . Osgood-Schlatter's disease of right knee    . Type 2 diabetes mellitus ([place])             Past Surgical History:  Procedure Laterality Date  .  CARDIAC CATHETERIZATION   2011  . CARDIAC CATHETERIZATION N/A 09/20/2015    Procedure: Left Heart Cath and Coronary Angiography;  Surgeon: [name], MD;  Location: [facility];  Service: Cardiovascular;  Laterality: N/A;  . CHOLECYSTECTOMY   2003  . COLONOSCOPY   06/19/2012    Procedure: COLONOSCOPY;  Surgeon: [name], MD;  Location: [facility];  Service: Endoscopy;  Laterality: N/A;  930  . KNEE ARTHROPLASTY Right 1999  . LEFT HEART CATHETERIZATION WITH CORONARY ANGIOGRAM N/A 12/09/2013    Procedure: LEFT HEART CATHETERIZATION WITH CORONARY ANGIOGRAM;  Surgeon: [name], MD;  Location: [facility] CATH LAB;  Service: Cardiovascular;  Laterality: N/A;  . Liver biopsy      . TONSILLECTOMY   1970's           Family History  Problem Relation Age of Onset  . Cancer Maternal Aunt    . CVA Maternal Grandmother    . CVA Maternal Grandfather            Social History  Substance Use Topics  . Smoking status: Former Smoker      Packs/day: 0.50      Years: 10.00      Types: Cigarettes  Start date: 02/02/1973      Quit date: 09/17/1983  . Smokeless tobacco: Former Systems developer      Types: [place] date: 09/17/1983  . Alcohol use No        Comment: 11-30-15 per pt no      BP (!) 141/92   Pulse 93   Ht 6' 3"  (1.905 m)   Wt 232 lb (105.2 kg)   BMI 29.00 kg/m    Physical Exam  Constitutional: He appears well-developed and well-nourished.  Vital signs have been reviewed and are stable. Gen. appearance the patient is well-developed and well-nourished with normal grooming and hygiene. The patient is oriented 3 with normal mood and affect.  Vitals reviewed.     Ortho Exam    Examination the right elbow shows tenderness over the lateral epicondyle and olecranon but not medially. No tenderness in the biceps tendon he said he had some but that resolved. In terms of range of motion he has normal supination although it's painful he has decreased pronation with  only 15 past neutral. His flexion is good. His extension shows 20 loss of extension his medial and lateral collateral ligaments are stable his strength is normal in flexion extension but abnormal in pronation supination skin is intact no rash pulses excellent lymph nodes are negative sensation is normal   Left elbow normal range of motion   X-ray report was read as normal I could not read it there was no picture on the disc that was readable. MRI report showed several findings one of them was a partial tear of the common extensor origin everything else was pretty much tendinitis tendinosis low-grade sprain ulnar collateral ligament. Clinically there was no ulnar collateral ligament symptoms or clinical finding           Meds ordered this encounter  Medications  . predniSONE (DELTASONE) 10 MG tablet      Sig: Take 1 tablet (10 mg total) by mouth 2 (two) times daily with a meal.      Dispense:  60 tablet      Refill:  0          Encounter Diagnosis  Name Primary?  . Right tennis elbow Yes     MRI  REPORT  Report was done and dictated on September 26 summation no acute fractures biceps triceps intact common extensor tendons mild common extensor tendon origin tendinosis ligaments normal cubital tunnel ulnar nerve normal mild triceps edema   I reviewed the films and there is no tear of the extensor mechanism of the elbow he does have tendinosis consistent with lateral epicondylitis common extensor origin     He has had an adequate course of physical therapy as well as injection and time for resolution. He would benefit from [place]   The procedure has been fully reviewed with the patient; The risks and benefits of surgery have been discussed and explained and understood. Alternative treatment has also been reviewed, questions were encouraged and answered. The postoperative plan is also been reviewed.    PLAN:  Right tennis elbow release

## 2017-07-24 NOTE — Pre-Procedure Instructions (Signed)
Hgb A1C routed to PCP.

## 2017-07-25 ENCOUNTER — Encounter (HOSPITAL_COMMUNITY): Payer: Self-pay | Admitting: *Deleted

## 2017-07-25 ENCOUNTER — Encounter (HOSPITAL_COMMUNITY)
Admission: RE | Disposition: A | Discharge status: Home / Self Care | Payer: Self-pay | Source: Ambulatory Visit | Attending: Orthopedic Surgery

## 2017-07-25 ENCOUNTER — Ambulatory Visit (HOSPITAL_COMMUNITY): Payer: Worker's Compensation | Admitting: Anesthesiology

## 2017-07-25 ENCOUNTER — Ambulatory Visit (HOSPITAL_COMMUNITY)
Admission: RE | Admit: 2017-07-25 | Discharge: 2017-07-25 | Disposition: A | Discharge status: Home / Self Care | Payer: Worker's Compensation | Source: Ambulatory Visit | Attending: Orthopedic Surgery | Admitting: Orthopedic Surgery

## 2017-07-25 DIAGNOSIS — E669 Obesity, unspecified: Secondary | ICD-10-CM | POA: Insufficient documentation

## 2017-07-25 DIAGNOSIS — M7711 Lateral epicondylitis, right elbow: Secondary | ICD-10-CM | POA: Insufficient documentation

## 2017-07-25 DIAGNOSIS — I2510 Atherosclerotic heart disease of native coronary artery without angina pectoris: Secondary | ICD-10-CM | POA: Insufficient documentation

## 2017-07-25 DIAGNOSIS — E782 Mixed hyperlipidemia: Secondary | ICD-10-CM | POA: Insufficient documentation

## 2017-07-25 DIAGNOSIS — M1990 Unspecified osteoarthritis, unspecified site: Secondary | ICD-10-CM | POA: Diagnosis not present

## 2017-07-25 DIAGNOSIS — I10 Essential (primary) hypertension: Secondary | ICD-10-CM | POA: Insufficient documentation

## 2017-07-25 DIAGNOSIS — Z9889 Other specified postprocedural states: Secondary | ICD-10-CM

## 2017-07-25 DIAGNOSIS — E119 Type 2 diabetes mellitus without complications: Secondary | ICD-10-CM | POA: Insufficient documentation

## 2017-07-25 DIAGNOSIS — Z6829 Body mass index (BMI) 29.0-29.9, adult: Secondary | ICD-10-CM | POA: Insufficient documentation

## 2017-07-25 DIAGNOSIS — S59909A Unspecified injury of unspecified elbow, initial encounter: Secondary | ICD-10-CM

## 2017-07-25 DIAGNOSIS — Z87891 Personal history of nicotine dependence: Secondary | ICD-10-CM | POA: Insufficient documentation

## 2017-07-25 HISTORY — DX: Lateral epicondylitis, right elbow: M77.11

## 2017-07-25 HISTORY — PX: TENNIS ELBOW RELEASE/NIRSCHEL PROCEDURE: SHX6651

## 2017-07-25 HISTORY — DX: Unspecified injury of unspecified elbow, initial encounter: S59.909A

## 2017-07-25 LAB — GLUCOSE, CAPILLARY
Glucose-Capillary: 215 mg/dL — ABNORMAL HIGH (ref 65–99)
Glucose-Capillary: 229 mg/dL — ABNORMAL HIGH (ref 65–99)

## 2017-07-25 SURGERY — TENNIS ELBOW RELEASE/NIRSCHEL PROCEDURE
Anesthesia: General | Site: Elbow | Laterality: Right

## 2017-07-25 MED ORDER — GLYCOPYRROLATE 0.2 MG/ML IJ SOLN
0.2000 mg | Freq: Once | INTRAMUSCULAR | Status: AC | PRN
  Administered 2017-07-25: 0.2 mg via INTRAVENOUS

## 2017-07-25 MED ORDER — PROPOFOL 10 MG/ML IV BOLUS
INTRAVENOUS | Status: AC
  Filled 2017-07-25: qty 40

## 2017-07-25 MED ORDER — MIDAZOLAM HCL 2 MG/2ML IJ SOLN
1.0000 mg | INTRAMUSCULAR | Status: AC
Start: 2017-07-25 — End: 2017-07-25
  Administered 2017-07-25: 2 mg via INTRAVENOUS

## 2017-07-25 MED ORDER — CHLORHEXIDINE GLUCONATE 4 % EX LIQD: 60.0000 mL | Freq: Once | CUTANEOUS | Status: DC

## 2017-07-25 MED ORDER — CEFAZOLIN SODIUM-DEXTROSE 2-4 GM/100ML-% IV SOLN
INTRAVENOUS | Status: AC
  Filled 2017-07-25: qty 100

## 2017-07-25 MED ORDER — FENTANYL CITRATE (PF) 100 MCG/2ML IJ SOLN
INTRAMUSCULAR | Status: AC
  Filled 2017-07-25: qty 2

## 2017-07-25 MED ORDER — PROPOFOL 10 MG/ML IV BOLUS
INTRAVENOUS | Status: DC | PRN
  Administered 2017-07-25: 80 mg via INTRAVENOUS
  Administered 2017-07-25: 200 mg via INTRAVENOUS
  Administered 2017-07-25: 20 mg via INTRAVENOUS
  Administered 2017-07-25: 120 mg via INTRAVENOUS

## 2017-07-25 MED ORDER — LACTATED RINGERS IV SOLN
INTRAVENOUS | Status: DC
Start: 2017-07-25 — End: 2017-07-25
  Administered 2017-07-25 (×2): via INTRAVENOUS

## 2017-07-25 MED ORDER — DEXAMETHASONE SODIUM PHOSPHATE 4 MG/ML IJ SOLN
INTRAMUSCULAR | Status: AC
  Filled 2017-07-25: qty 1

## 2017-07-25 MED ORDER — CEFAZOLIN SODIUM-DEXTROSE 2-4 GM/100ML-% IV SOLN
2.0000 g | INTRAVENOUS | Status: AC
  Administered 2017-07-25: 2 g via INTRAVENOUS

## 2017-07-25 MED ORDER — LIDOCAINE HCL (PF) 1 % IJ SOLN
INTRAMUSCULAR | Status: AC
  Filled 2017-07-25: qty 5

## 2017-07-25 MED ORDER — ONDANSETRON HCL 4 MG/2ML IJ SOLN
4.0000 mg | Freq: Once | INTRAMUSCULAR | Status: AC
  Administered 2017-07-25: 4 mg via INTRAVENOUS

## 2017-07-25 MED ORDER — ARTIFICIAL TEARS OPHTHALMIC OINT
TOPICAL_OINTMENT | OPHTHALMIC | Status: AC
  Filled 2017-07-25: qty 3.5

## 2017-07-25 MED ORDER — GLYCOPYRROLATE 0.2 MG/ML IJ SOLN
INTRAMUSCULAR | Status: AC
Start: 2017-07-25 — End: ?
  Filled 2017-07-25: qty 1

## 2017-07-25 MED ORDER — FENTANYL CITRATE (PF) 100 MCG/2ML IJ SOLN
25.0000 ug | INTRAMUSCULAR | Status: DC | PRN
  Administered 2017-07-25 (×2): 50 ug via INTRAVENOUS
  Filled 2017-07-25: qty 2

## 2017-07-25 MED ORDER — SUCCINYLCHOLINE CHLORIDE 20 MG/ML IJ SOLN
INTRAMUSCULAR | Status: AC
  Filled 2017-07-25: qty 2

## 2017-07-25 MED ORDER — IBUPROFEN 800 MG PO TABS: 800.0000 mg | ORAL_TABLET | Freq: Three times a day (TID) | ORAL | 1 refills | Status: DC | PRN

## 2017-07-25 MED ORDER — DEXAMETHASONE SODIUM PHOSPHATE 4 MG/ML IJ SOLN
4.0000 mg | INTRAMUSCULAR | Status: AC
  Administered 2017-07-25: 4 mg via INTRAVENOUS

## 2017-07-25 MED ORDER — ONDANSETRON HCL 4 MG/2ML IJ SOLN
INTRAMUSCULAR | Status: AC
  Filled 2017-07-25: qty 2

## 2017-07-25 MED ORDER — SODIUM CHLORIDE 0.9 % IR SOLN
Status: DC | PRN
  Administered 2017-07-25: 1000 mL

## 2017-07-25 MED ORDER — LIDOCAINE HCL (CARDIAC) 10 MG/ML IV SOLN
INTRAVENOUS | Status: DC | PRN
  Administered 2017-07-25: 50 mg via INTRAVENOUS

## 2017-07-25 MED ORDER — HYDROCODONE-ACETAMINOPHEN 5-325 MG PO TABS: 1.0000 | ORAL_TABLET | ORAL | 0 refills | Status: DC | PRN

## 2017-07-25 MED ORDER — BUPIVACAINE-EPINEPHRINE 0.5% -1:200000 IJ SOLN
INTRAMUSCULAR | Status: DC | PRN
  Administered 2017-07-25: 10 mL

## 2017-07-25 MED ORDER — FENTANYL CITRATE (PF) 100 MCG/2ML IJ SOLN
INTRAMUSCULAR | Status: DC | PRN
  Administered 2017-07-25 (×3): 50 ug via INTRAVENOUS

## 2017-07-25 MED ORDER — MIDAZOLAM HCL 2 MG/2ML IJ SOLN
INTRAMUSCULAR | Status: AC
  Filled 2017-07-25: qty 2

## 2017-07-25 SURGICAL SUPPLY — 59 items
APL SKNCLS STERI-STRIP NONHPOA (GAUZE/BANDAGES/DRESSINGS) ×1
BAG HAMPER (MISCELLANEOUS) ×3 IMPLANT
BANDAGE ELASTIC 4 VELCRO NS (GAUZE/BANDAGES/DRESSINGS) IMPLANT
BANDAGE ELASTIC 6 LF NS (GAUZE/BANDAGES/DRESSINGS) ×2 IMPLANT
BANDAGE ELASTIC 6 VELCRO NS (GAUZE/BANDAGES/DRESSINGS) IMPLANT
BANDAGE ESMARK 4X12 BL STRL LF (DISPOSABLE) ×1 IMPLANT
BENZOIN TINCTURE PRP APPL 2/3 (GAUZE/BANDAGES/DRESSINGS) ×2 IMPLANT
BNDG CMPR 12X4 ELC STRL LF (DISPOSABLE) ×1
BNDG CMPR MED 5X6 ELC HKLP NS (GAUZE/BANDAGES/DRESSINGS) ×1
BNDG COHESIVE 4X5 TAN STRL (GAUZE/BANDAGES/DRESSINGS) IMPLANT
BNDG ESMARK 4X12 BLUE STRL LF (DISPOSABLE) ×3
CHLORAPREP W/TINT 26ML (MISCELLANEOUS) ×3 IMPLANT
CLOSURE WOUND 1/2 X4 (GAUZE/BANDAGES/DRESSINGS) ×1
CLOTH BEACON ORANGE TIMEOUT ST (SAFETY) ×3 IMPLANT
COVER LIGHT HANDLE STERIS (MISCELLANEOUS) ×6 IMPLANT
CUFF TOURNIQUET SINGLE 18IN (TOURNIQUET CUFF) ×3 IMPLANT
DECANTER SPIKE VIAL GLASS SM (MISCELLANEOUS) ×3 IMPLANT
DRAPE PROXIMA HALF (DRAPES) ×3 IMPLANT
DRSG XEROFORM 1X8 (GAUZE/BANDAGES/DRESSINGS) ×2 IMPLANT
ELECT REM PT RETURN 9FT ADLT (ELECTROSURGICAL) ×3
ELECTRODE REM PT RTRN 9FT ADLT (ELECTROSURGICAL) ×1 IMPLANT
GAUZE SPONGE 4X4 12PLY STRL (GAUZE/BANDAGES/DRESSINGS) ×3 IMPLANT
GAUZE XEROFORM 5X9 LF (GAUZE/BANDAGES/DRESSINGS) ×3 IMPLANT
GLOVE BIOGEL PI IND STRL 7.0 (GLOVE) ×1 IMPLANT
GLOVE BIOGEL PI INDICATOR 7.0 (GLOVE) ×2
GLOVE SKINSENSE NS SZ8.0 LF (GLOVE) ×4
GLOVE SKINSENSE STRL SZ8.0 LF (GLOVE) ×2 IMPLANT
GLOVE SS N UNI LF 8.5 STRL (GLOVE) ×3 IMPLANT
GOWN STRL REUS W/ TWL LRG LVL3 (GOWN DISPOSABLE) ×1 IMPLANT
GOWN STRL REUS W/TWL LRG LVL3 (GOWN DISPOSABLE) ×9 IMPLANT
GOWN STRL REUS W/TWL XL LVL3 (GOWN DISPOSABLE) ×3 IMPLANT
INST SET MINOR BONE (KITS) ×3 IMPLANT
KIT ROOM TURNOVER APOR (KITS) ×3 IMPLANT
MANIFOLD NEPTUNE II (INSTRUMENTS) ×3 IMPLANT
NDL HYPO 21X1.5 SAFETY (NEEDLE) ×1 IMPLANT
NEEDLE HYPO 21X1.5 SAFETY (NEEDLE) ×3 IMPLANT
NS IRRIG 1000ML POUR BTL (IV SOLUTION) ×3 IMPLANT
PACK BASIC LIMB (CUSTOM PROCEDURE TRAY) ×3 IMPLANT
PAD ABD 5X9 TENDERSORB (GAUZE/BANDAGES/DRESSINGS) IMPLANT
PAD ARMBOARD 7.5X6 YLW CONV (MISCELLANEOUS) ×3 IMPLANT
PAD CAST 4YDX4 CTTN HI CHSV (CAST SUPPLIES) IMPLANT
PADDING CAST COTTON 4X4 STRL (CAST SUPPLIES)
PADDING WEBRIL 4 STERILE (GAUZE/BANDAGES/DRESSINGS) ×2 IMPLANT
SET BASIN LINEN APH (SET/KITS/TRAYS/PACK) ×3 IMPLANT
SPLINT IMMOBILIZER J 3INX20FT (CAST SUPPLIES)
SPLINT J IMMOBILIZER 3X20FT (CAST SUPPLIES) IMPLANT
STAPLER VISISTAT 35W (STAPLE) ×2 IMPLANT
STRIP CLOSURE SKIN 1/2X4 (GAUZE/BANDAGES/DRESSINGS) ×2 IMPLANT
SUT ETHIBOND 2 V 37 (SUTURE) ×3 IMPLANT
SUT ETHILON 3 0 FSL (SUTURE) ×3 IMPLANT
SUT MON AB 0 CT1 (SUTURE) ×2 IMPLANT
SUT MON AB 2-0 SH 27 (SUTURE) ×3
SUT MON AB 2-0 SH27 (SUTURE) ×1 IMPLANT
SUT PROLENE 3 0 PS 1 (SUTURE) ×3 IMPLANT
SUT VIC AB 1 CT1 27 (SUTURE) ×3
SUT VIC AB 1 CT1 27XBRD ANTBC (SUTURE) ×1 IMPLANT
SUT VICRYL AB 3-0 FS1 BRD 27IN (SUTURE) IMPLANT
SYR CONTROL 10ML LL (SYRINGE) ×3 IMPLANT
TOWEL OR 17X26 4PK STRL BLUE (TOWEL DISPOSABLE) ×3 IMPLANT

## 2017-07-25 NOTE — Interval H&P Note (Signed)
History and Physical Interval Note:  07/25/2017 7:18 AM  Jared Griffin  has presented today for surgery, with the diagnosis of tennis elbow  The various methods of treatment have been discussed with the patient and family. After consideration of risks, benefits and other options for treatment, the patient has consented to  Procedure(s): TENNIS ELBOW RELEASE/NIRSCHEL PROCEDURE (Right) as a surgical intervention .  The patient's history has been reviewed, patient examined, no change in status, stable for surgery.  I have reviewed the patient's chart and labs.  Questions were answered to the patient's satisfaction.     Arther Abbott

## 2017-07-25 NOTE — Brief Op Note (Signed)
07/25/2017  7:37 AM  PATIENT:  Jared Griffin  56 y.o. male  PRE-OPERATIVE DIAGNOSIS:  tennis elbow right elbow  POST-OPERATIVE DIAGNOSIS: Same  PROCEDURE:  Procedure(s): TENNIS ELBOW RELEASE/NIRSCHEL PROCEDURE (Right)  Tenotomy elbow lateral debridement of bone and soft tissue open right elbow  SURGEON:  Surgeon(s) and Role:    [name], MD - Primary  PHYSICIAN ASSISTANT:   ASSISTANTS: betty ashley   ANESTHESIA:   general  EBL:  none   BLOOD ADMINISTERED:none  DRAINS: none   LOCAL MEDICATIONS USED:  MARCAINE     SPECIMEN:  No Specimen  DISPOSITION OF SPECIMEN:  N/A  COUNTS:  YES  TOURNIQUET:  * Missing tourniquet times found for documented tourniquets in log: [id number] *  DICTATION: .Dragon Dictation   The patient was identified in the preop area confirmed with 2 identification markers.  The right arm is marked for surgery.  Chart review completed.  Patient taken to surgery given preoperative antibiotics Ancef.  Sterile prep and drape was performed.  After administration of general anesthesia a timeout was completed.  The right arm was exsanguinated with an Esmarch and the tourniquet was elevated to 250 mmHg  The lateral incision was made over the right elbow subcutaneous tissue was divided blunt dissection was carried down to the extensor carpi radialis longus and extensor digitorum communists.  This area was longitudinally split and the extensor carpi radialis brevis tendon was evaluated and found to have degenerative tissue which was debrided.  The lateral epicondyle was debrided as well to create a roughened surface.  The ECRB tendon was repaired to the fascia of the periosteum of the lateral epicondyle with 0 Monocryl suture, the tendon split of the ECRL and EDC was closed with 0 Monocryl.  Subcutaneous tissue closed with 2-0 Monocryl.  Steri-Strips were applied  Sterile dressing applied     PLAN OF CARE: Discharge to home after PACU  PATIENT  DISPOSITION:  PACU - hemodynamically stable.   Delay start of Pharmacological VTE agent (>24hrs) due to surgical blood loss or risk of bleeding: not applicable   [id number]

## 2017-07-25 NOTE — Anesthesia Postprocedure Evaluation (Signed)
Anesthesia Post Note  Patient: Jared Griffin  Procedure(s) Performed: TENNIS ELBOW RELEASE and debriedment (Right Elbow)  Patient location during evaluation: PACU Anesthesia Type: General Level of consciousness: awake and alert Pain management: satisfactory to patient Vital Signs Assessment: post-procedure vital signs reviewed and stable Respiratory status: patient connected to nasal cannula oxygen Cardiovascular status: stable Postop Assessment: no apparent nausea or vomiting Anesthetic complications: no     Last Vitals:  Vitals:   07/25/17 0936 07/25/17 0945  BP: 140/87 (!) 144/88  Pulse:  83  Resp: 16 15  Temp:    SpO2: 97% 96%    Last Pain:  Vitals:   07/25/17 0936  TempSrc:   PainSc: 8                  Aydien Majette

## 2017-07-25 NOTE — Anesthesia Preprocedure Evaluation (Signed)
Anesthesia Evaluation  Patient identified by MRN, date of birth, ID band Patient awake    Reviewed: Allergy & Precautions, NPO status , Patient's Chart, lab work & pertinent test results  Airway Mallampati: II  TM Distance: >3 FB Neck ROM: Limited    Dental  (+) Teeth Intact, Chipped,    Pulmonary neg pulmonary ROS, former smoker,    breath sounds clear to auscultation       Cardiovascular hypertension, (-) angina+ CAD and + Cardiac Stents   Rhythm:Regular Rate:Normal     Neuro/Psych PSYCHIATRIC DISORDERS Anxiety Depression  Neuromuscular disease    GI/Hepatic GERD  Controlled,(+) Cirrhosis       ,   Endo/Other  diabetes, Type 2  Renal/GU      Musculoskeletal   Abdominal   Peds  Hematology   Anesthesia Other Findings   Reproductive/Obstetrics                             Anesthesia Physical Anesthesia Plan  ASA: III  Anesthesia Plan: General   Post-op Pain Management:    Induction: Intravenous  PONV Risk Score and Plan:   Airway Management Planned: LMA  Additional Equipment:   Intra-op Plan:   Post-operative Plan: Extubation in OR  Informed Consent: I have reviewed the patients History and Physical, chart, labs and discussed the procedure including the risks, benefits and alternatives for the proposed anesthesia with the patient or authorized representative who has indicated his/her understanding and acceptance.     Plan Discussed with:   Anesthesia Plan Comments:         Anesthesia Quick Evaluation

## 2017-07-25 NOTE — Transfer of Care (Signed)
Immediate Anesthesia Transfer of Care Note  Patient: Jared Griffin  Procedure(s) Performed: TENNIS ELBOW RELEASE and debriedment (Right Elbow)  Patient Location: PACU  Anesthesia Type:General  Level of Consciousness: awake and patient cooperative  Airway & Oxygen Therapy: Patient Spontanous Breathing and Patient connected to nasal cannula oxygen  Post-op Assessment: Report given to RN and Post -op Vital signs reviewed and stable  Post vital signs: Reviewed and stable  Last Vitals:  Vitals:   07/25/17 0805 07/25/17 0810  BP: (!) 142/89 134/90  Resp: (!) 21 14  Temp:    SpO2: 93% 93%    Last Pain:  Vitals:   07/25/17 0723  TempSrc: Oral  PainSc: 5       Patients Stated Pain Goal: 8 (37/54/23 7023)  Complications: No apparent anesthesia complications

## 2017-07-25 NOTE — Anesthesia Procedure Notes (Signed)
Procedure Name: LMA Insertion Date/Time: 07/25/2017 8:30 AM Performed by: Vista Deck, CRNA Pre-anesthesia Checklist: Patient identified, Patient being monitored, Emergency Drugs available, Timeout performed and Suction available Patient Re-evaluated:Patient Re-evaluated prior to induction Oxygen Delivery Method: Circle System Utilized Preoxygenation: Pre-oxygenation with 100% oxygen Induction Type: IV induction Ventilation: Mask ventilation without difficulty LMA: LMA inserted LMA Size: 4.0 Number of attempts: 1 Placement Confirmation: positive ETCO2 and breath sounds checked- equal and bilateral Tube secured with: Tape Dental Injury: Teeth and Oropharynx as per pre-operative assessment

## 2017-07-26 ENCOUNTER — Encounter (HOSPITAL_COMMUNITY): Payer: Self-pay | Admitting: Orthopedic Surgery

## 2017-07-26 ENCOUNTER — Telehealth: Payer: Self-pay | Admitting: Orthopedic Surgery

## 2017-07-26 NOTE — Telephone Encounter (Signed)
Patient relays pain medication not helping (status/post tennis elbow release) yesterday, 07/25/17. Please advise.

## 2017-07-26 NOTE — Op Note (Signed)
07/25/2017   7:37 AM   PATIENT:  Jared Griffin  56 y.o. male   PRE-OPERATIVE DIAGNOSIS:  tennis elbow right elbow   POST-OPERATIVE DIAGNOSIS: Same   PROCEDURE:  Procedure(s): TENNIS ELBOW RELEASE/NIRSCHEL PROCEDURE (Right)  Tenotomy elbow lateral debridement of bone and soft tissue open right elbow   SURGEON:  Surgeon(s) and Role:    [name], MD - Primary   PHYSICIAN ASSISTANT:    ASSISTANTS: betty ashley    ANESTHESIA:   general   EBL:  none    BLOOD ADMINISTERED:none   DRAINS: none    LOCAL MEDICATIONS USED:  MARCAINE      SPECIMEN:  No Specimen   DISPOSITION OF SPECIMEN:  N/A   COUNTS:  YES   TOURNIQUET:  * Missing tourniquet times found for documented tourniquets in log: [id number] *   DICTATION: .Dragon Dictation    The patient was identified in the preop area confirmed with 2 identification markers.  The right arm is marked for surgery.  Chart review completed.   Patient taken to surgery given preoperative antibiotics Ancef.  Sterile prep and drape was performed.  After administration of general anesthesia a timeout was completed.   The right arm was exsanguinated with an Esmarch and the tourniquet was elevated to 250 mmHg   The lateral incision was made over the right elbow subcutaneous tissue was divided blunt dissection was carried down to the extensor carpi radialis longus and extensor digitorum communists.  This area was longitudinally split and the extensor carpi radialis brevis tendon was evaluated and found to have degenerative tissue which was debrided.  The lateral epicondyle was debrided as well to create a roughened surface.  The ECRB tendon was repaired to the fascia of the periosteum of the lateral epicondyle with 0 Monocryl suture, the tendon split of the ECRL and EDC was closed with 0 Monocryl.  Subcutaneous tissue closed with 2-0 Monocryl.   Steri-Strips were applied   Sterile dressing applied         PLAN OF CARE: Discharge to  home after PACU   PATIENT DISPOSITION:  PACU - hemodynamically stable.   Delay start of Pharmacological VTE agent (>24hrs) due to surgical blood loss or risk of bleeding: not applicable     [id number]

## 2017-07-26 NOTE — Telephone Encounter (Signed)
I spoke to his wife, and then to Dr [name], he is on Hydrocodone, which should be adequate to control his pain. He needs to make sure he is using the Ibuprofen, ice and elevation. I have encouraged all these, along with gentle finger ROM

## 2017-07-30 ENCOUNTER — Encounter: Payer: Self-pay | Admitting: Orthopedic Surgery

## 2017-07-30 ENCOUNTER — Ambulatory Visit (INDEPENDENT_AMBULATORY_CARE_PROVIDER_SITE_OTHER): Payer: Worker's Compensation | Admitting: Orthopedic Surgery

## 2017-07-30 VITALS — BP 111/82 | HR 87 | Ht 74.0 in | Wt 235.0 lb

## 2017-07-30 DIAGNOSIS — M7711 Lateral epicondylitis, right elbow: Secondary | ICD-10-CM

## 2017-07-30 DIAGNOSIS — Z4889 Encounter for other specified surgical aftercare: Secondary | ICD-10-CM

## 2017-07-30 NOTE — Patient Instructions (Signed)
Keep clean and dry   Start moving the elbow as tolerated  Do not lift anything with the operative arm  Use sling as needed

## 2017-07-30 NOTE — Progress Notes (Signed)
No chief complaint on file.  There were no vitals taken for this visit.  Encounter Diagnoses  Name Primary?  Marland Kitchen Aftercare following surgery Yes  . Right tennis elbow     Wound clean   Apply clear dressing   Start rom exercises   Fu 27th wound check

## 2017-08-02 ENCOUNTER — Other Ambulatory Visit: Payer: Self-pay | Admitting: Family Medicine

## 2017-08-02 ENCOUNTER — Encounter: Payer: Self-pay | Admitting: Family Medicine

## 2017-08-02 ENCOUNTER — Ambulatory Visit (INDEPENDENT_AMBULATORY_CARE_PROVIDER_SITE_OTHER): Payer: PRIVATE HEALTH INSURANCE | Admitting: Family Medicine

## 2017-08-02 VITALS — BP 126/84 | Ht 74.0 in | Wt 235.0 lb

## 2017-08-02 DIAGNOSIS — E119 Type 2 diabetes mellitus without complications: Secondary | ICD-10-CM

## 2017-08-02 DIAGNOSIS — Z0001 Encounter for general adult medical examination with abnormal findings: Secondary | ICD-10-CM

## 2017-08-02 DIAGNOSIS — E7800 Pure hypercholesterolemia, unspecified: Secondary | ICD-10-CM

## 2017-08-02 DIAGNOSIS — Z23 Encounter for immunization: Secondary | ICD-10-CM

## 2017-08-02 DIAGNOSIS — Z125 Encounter for screening for malignant neoplasm of prostate: Secondary | ICD-10-CM | POA: Diagnosis not present

## 2017-08-02 DIAGNOSIS — I10 Essential (primary) hypertension: Secondary | ICD-10-CM | POA: Diagnosis not present

## 2017-08-02 DIAGNOSIS — Z0000 Encounter for general adult medical examination without abnormal findings: Secondary | ICD-10-CM

## 2017-08-02 MED ORDER — METFORMIN HCL 500 MG PO TABS: ORAL_TABLET | ORAL | 1 refills | Status: DC

## 2017-08-02 MED ORDER — PANTOPRAZOLE SODIUM 40 MG PO TBEC: DELAYED_RELEASE_TABLET | ORAL | 1 refills | Status: DC

## 2017-08-02 MED ORDER — GLIPIZIDE 5 MG PO TABS: 10.0000 mg | ORAL_TABLET | Freq: Two times a day (BID) | ORAL | 1 refills | Status: DC

## 2017-08-02 MED ORDER — CANAGLIFLOZIN 300 MG PO TABS: ORAL_TABLET | ORAL | 1 refills | Status: DC

## 2017-08-02 MED ORDER — DILTIAZEM HCL 30 MG PO TABS: 30.0000 mg | ORAL_TABLET | Freq: Two times a day (BID) | ORAL | 1 refills | Status: DC

## 2017-08-02 NOTE — Progress Notes (Signed)
Subjective:    Patient ID: Jared Griffin, male    DOB: [birth year], 56 y.o.   MRN: [id number]  HPI The patient comes in today for a wellness visit.    A review of their health history was completed.  A review of medications was also completed.  Any needed refills; none  Eating habits: health conscious  Falls/  MVA accidents in past few months: none  Regular exercise: none  Specialist pt sees on regular basis: Dr. [name] for arm  Preventative health issues were discussed.   Additional concerns: none  Results for orders placed or performed during the hospital encounter of 07/25/17  Glucose, capillary  Result Value Ref Range   Glucose-Capillary 215 (H) 65 - 99 mg/dL  Glucose, capillary  Result Value Ref Range   Glucose-Capillary 229 (H) 65 - 99 mg/dL   Diet overall pretty good, watching veggies mot as muc junk food  Patient claims compliance with diabetes medication. No obvious side effects. Reports no substantial low sugar spells. Most numbers are generally in good range when checked fasting. Generally does not miss a dose of medication. Watching diabetic diet closely   Blood pressure medicine and blood pressure levels reviewed today with patient. Compliant with blood pressure medicine. States does not miss a dose. No obvious side effects. Blood pressure generally good when checked elsewhere. Watching salt intake.   Patient continues to take lipid medication regularly. No obvious side effects from it. Generally does not miss a dose. Prior blood work results are reviewed with patient. Patient continues to work on fat intake in diet  Numbers up more now with the fasting sugars  Flu shot today   Injury at work led to  [employer] and surger   Still owrking with mental health folks,   Review of Systems  Constitutional: Negative for activity change, appetite change and fever.  HENT: Negative for congestion and rhinorrhea.   Eyes: Negative for discharge.    Respiratory: Negative for cough and wheezing.   Cardiovascular: Negative for chest pain.  Gastrointestinal: Negative for abdominal pain, blood in stool and vomiting.  Genitourinary: Negative for difficulty urinating and frequency.  Musculoskeletal: Negative for neck pain.  Skin: Negative for rash.  Allergic/Immunologic: Negative for environmental allergies and food allergies.  Neurological: Negative for weakness and headaches.  Psychiatric/Behavioral: Negative for agitation.  All other systems reviewed and are negative.      Objective:   Physical Exam  Constitutional: He appears well-developed and well-nourished.  HENT:  Head: Normocephalic and atraumatic.  Right Ear: External ear normal.  Left Ear: External ear normal.  Nose: Nose normal.  Mouth/Throat: Oropharynx is clear and moist.  Eyes: EOM are normal. Pupils are equal, round, and reactive to light.  Neck: Normal range of motion. Neck supple. No thyromegaly present.  Cardiovascular: Normal rate, regular rhythm and normal heart sounds.  No murmur heard. Pulmonary/Chest: Effort normal and breath sounds normal. No respiratory distress. He has no wheezes.  Abdominal: Soft. Bowel sounds are normal. He exhibits no distension and no mass. There is no tenderness.  Genitourinary: Penis normal.  Musculoskeletal: Normal range of motion. He exhibits no edema.  Lymphadenopathy:    He has no cervical adenopathy.  Neurological: He is alert. He exhibits normal muscle tone.  Skin: Skin is warm and dry. No erythema.  Psychiatric: He has a normal mood and affect. His behavior is normal. Judgment normal.          Assessment & Plan:  Colonoscopy due at five  yrs out  Wellness exam colonoscopy due now.  Patient is scheduled flu shot today.  Blood work reviewed.  Diet exercise discussed.  2 type 2 diabetes.  Poor control.  Discussed.  Patient chooses to work on himself before increasing medication.  Already on 3 maximum oral  dose    3.  Hypertension good control discussed to maintain same  4.  Hyperlipidemia/discussed.  Need to check blood work prior numbers reviewed.

## 2017-08-07 ENCOUNTER — Encounter (HOSPITAL_COMMUNITY): Payer: Self-pay | Admitting: Psychiatry

## 2017-08-07 ENCOUNTER — Other Ambulatory Visit (INDEPENDENT_AMBULATORY_CARE_PROVIDER_SITE_OTHER): Payer: Self-pay | Admitting: *Deleted

## 2017-08-07 ENCOUNTER — Ambulatory Visit (INDEPENDENT_AMBULATORY_CARE_PROVIDER_SITE_OTHER): Payer: PRIVATE HEALTH INSURANCE | Admitting: Psychiatry

## 2017-08-07 VITALS — BP 118/72 | HR 83 | Ht 74.0 in | Wt 236.0 lb

## 2017-08-07 DIAGNOSIS — Z87891 Personal history of nicotine dependence: Secondary | ICD-10-CM | POA: Diagnosis not present

## 2017-08-07 DIAGNOSIS — F321 Major depressive disorder, single episode, moderate: Secondary | ICD-10-CM | POA: Diagnosis not present

## 2017-08-07 DIAGNOSIS — Z79899 Other long term (current) drug therapy: Secondary | ICD-10-CM

## 2017-08-07 DIAGNOSIS — Z86010 Personal history of colonic polyps: Secondary | ICD-10-CM

## 2017-08-07 DIAGNOSIS — F419 Anxiety disorder, unspecified: Secondary | ICD-10-CM | POA: Diagnosis not present

## 2017-08-07 DIAGNOSIS — Z566 Other physical and mental strain related to work: Secondary | ICD-10-CM

## 2017-08-07 LAB — MICROALBUMIN / CREATININE URINE RATIO
Creatinine, Urine: 64.9 mg/dL
Microalb/Creat Ratio: 10.5 mg/g creat (ref 0.0–30.0)
Microalbumin, Urine: 6.8 ug/mL

## 2017-08-07 LAB — LIPID PANEL
Chol/HDL Ratio: 5.5 ratio — ABNORMAL HIGH (ref 0.0–5.0)
Cholesterol, Total: 171 mg/dL (ref 100–199)
HDL: 31 mg/dL — ABNORMAL LOW (ref >39)
Triglycerides: 567 mg/dL (ref 0–149)

## 2017-08-07 LAB — PSA: Prostate Specific Ag, Serum: 0.4 ng/mL (ref 0.0–4.0)

## 2017-08-07 MED ORDER — SERTRALINE HCL 100 MG PO TABS
100.0000 mg | ORAL_TABLET | Freq: Every day | ORAL | 2 refills | Status: DC
Start: 2017-08-07 — End: 2017-11-18

## 2017-08-07 MED ORDER — ALPRAZOLAM 0.5 MG PO TABS: 0.5000 mg | ORAL_TABLET | Freq: Three times a day (TID) | ORAL | 2 refills | Status: DC

## 2017-08-07 NOTE — Progress Notes (Signed)
[facility] MD/PA/NP OP Progress Note  08/07/2017 10:06 AM Jared Griffin  MRN:  [id number]  Chief Complaint:  Chief Complaint    Depression; Anxiety; Follow-up     HPI: This patient is a 56 year old married white male lives with his wife and 48 year old son in [place]. 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  The patient was referred by his primary physician, Dr. [name], for further assessment and treatment of anxiety and depression.  The patient states that he is become increasingly depressed and anxious over the last couple of years due to work stress. He stated that he was always under pressure to get more things done than he had time to do. 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 He got to the point of hating going to work. Several years ago he had cardiac problems and had a stent placed. In January of this year he began developing dizzy spells chest pain headaches stomach aches. It got so bad that he was readmitted for cardiac catheterization but nothing new was found.  The patient was having difficulty sleeping constant worry and anxiety about his job sadness and anger. He was getting irritable with people at home. After talking to his primary care physician at length and decided to take him out on medical retirement. The patient has been on Wellbutrin SR 150 no gram twice a day for several weeks now. He had also been out in the past when his wife was sick. He has not seen any benefit from this but does feel tremendously better since he stopped working. He is also on Xanax 0.5 mg 3 times a day which he thinks has helped to some degree.  Since stopping working he's had a big turnaround. His sleep and energy have improved. 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 He is much less anxious and  is no longer having the chest pain dizzy spells or stomachaches. He denies suicidal ideation or psychotic symptoms. He does not use drugs or alcohol  Patient returns after 3 months.  He has had to have right elbow surgery and is out of work for a couple of months right now to recover.  Also his wife was found to have a thoracic spine tumor which hopefully is benign.  They have been through a lot lately.  He saw his primary doctor recently and his A1c and triglycerides are very high and he is going to need to readjust his diet.  He states that with all the medical issues he has not had time to think about it.  Overall his mood is good and he denies any current panic attacks he is sleeping well at night.  He thinks his medications have been very helpful Visit Diagnosis:    ICD-10-CM   1. Moderate single current episode of major depressive disorder ([place]) F32.1     Past Psychiatric History: None  Past Medical History:  Past Medical History:  Diagnosis Date  . Anxiety   . 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 Coronary atherosclerosis of native coronary artery    a. 12/09/2013: s/p PCI with 3.0 x 28 Promus DES to mLAD  . Depression   . Essential hypertension, benign   . Fatty liver disease, nonalcoholic   . Lateral epicondylitis  of right elbow   . Mixed hyperlipidemia 2006  . Obesity   . Osgood-Schlatter's disease of right knee   . Type 2 diabetes mellitus ([place])     Past Surgical History:  Procedure Laterality Date  . CARDIAC CATHETERIZATION  2011  . CARDIAC CATHETERIZATION N/A 09/20/2015   Procedure: Left Heart Cath and Coronary Angiography;  Surgeon: [name], MD;  Location: [facility];  Service: Cardiovascular;  Laterality: N/A;  . CHOLECYSTECTOMY  2003  . COLONOSCOPY  06/19/2012   Procedure: COLONOSCOPY;  Surgeon: [name], MD;  Location: [facility];  Service: Endoscopy;  Laterality: N/A;  930  . KNEE ARTHROPLASTY Right 1999  . LEFT HEART  CATHETERIZATION WITH CORONARY ANGIOGRAM N/A 12/09/2013   Procedure: LEFT HEART CATHETERIZATION WITH CORONARY ANGIOGRAM;  Surgeon: [name], MD;  Location: [facility] CATH LAB;  Service: Cardiovascular;  Laterality: N/A;  . Liver biopsy    . TENNIS ELBOW RELEASE/NIRSCHEL PROCEDURE Right 07/25/2017   Procedure: TENNIS ELBOW RELEASE and debriedment;  Surgeon: [name], MD;  Location: [facility];  Service: Orthopedics;  Laterality: Right;  . TONSILLECTOMY  1970's    Family Psychiatric History: None  Family History:  Family History  Problem Relation Age of Onset  . Cancer Maternal Aunt   . CVA Maternal Grandmother   . CVA Maternal Grandfather     Social History:  Social History   Socioeconomic History  . Marital status: Married    Spouse name: None  . Number of children: None  . Years of education: None  . Highest education level: None  Social Needs  . Financial resource strain: None  . Food insecurity - worry: None  . Food insecurity - inability: None  . Transportation needs - medical: None  . Transportation needs - non-medical: None  Occupational History  . None  Tobacco Use  . Smoking status: Former Smoker    Packs/day: 0.50    Years: 10.00    Pack years: 5.00    Types: Cigarettes    Start date: 02/02/1973    Last attempt to quit: 09/17/1983    Years since quitting: 33.9  . Smokeless tobacco: Former Systems developer    Types: [place] date: 09/17/1983  Substance and Sexual Activity  . Alcohol use: No    Alcohol/week: 0.0 oz    Comment: 11-30-15 per pt no  . Drug use: No    Comment: 11-30-15 per pt no  . Sexual activity: Yes  Other Topics Concern  . 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    Married with 1 child   Right handed    12 th    Very little caffeine    Allergies:  Allergies  Allergen Reactions  . Metoprolol Rash    Metabolic Disorder Labs: Lab Results  Component  Value Date   HGBA1C 8.5 (H) 07/23/2017   MPG 197.25 07/23/2017   No results found for: PROLACTIN Lab Results  Component Value Date   CHOL 171 08/06/2017   TRIG 567 (HH) 08/06/2017   HDL 31 (L) 08/06/2017   CHOLHDL 5.5 (H) 08/06/2017   VLDL 27.0 02/22/2014   [place] Comment 08/06/2017   LDLCALC 70 11/24/2016   Lab Results  Component Value Date   TSH 0.458 04/13/2017   TSH 0.493 02/05/2014    Therapeutic Level Labs: No results found for: LITHIUM No results found for: VALPROATE No  components found for:  CBMZ  Current Medications: Current Outpatient Medications  Medication Sig Dispense Refill  . ACCU-CHEK AVIVA PLUS test strip CHECK BLOOD SUGAR UP TO FOUR TIMES A DAY. 100 each 5  . acetaminophen (TYLENOL) 500 MG tablet Take 1,000 mg by mouth daily as needed for headache. Pain.    Marland Kitchen ALPRAZolam (XANAX) 0.5 MG tablet Take 1 tablet (0.5 mg total) by mouth 3 (three) times daily. 90 tablet 2  . aspirin (ASPIR-LOW) 81 MG EC tablet Take 81 mg by mouth daily.     Marland Kitchen atorvastatin (LIPITOR) 40 MG tablet TAKE ONE (1) TABLET BY MOUTH EVERY DAY 30 tablet 6  . canagliflozin (INVOKANA) 300 MG TABS tablet TAKE ONE TABLET BY MOUTH ONCE DAILY WITH BREAKFAST. 90 tablet 1  . diltiazem (CARDIZEM) 30 MG tablet Take 1 tablet (30 mg total) 2 (two) times daily by mouth. 180 tablet 1  . gabapentin (NEURONTIN) 300 MG capsule Take 600 mg by mouth 2 (two) times daily.     Marland Kitchen glipiZIDE (GLUCOTROL) 5 MG tablet Take 2 tablets (10 mg total) 2 (two) times daily by mouth. 360 tablet 1  . HYDROcodone-acetaminophen (NORCO/VICODIN) 5-325 MG tablet Take 1-2 tablets every 4 (four) hours as needed by mouth for moderate pain. 40 tablet 0  . ibuprofen (ADVIL,MOTRIN) 800 MG tablet Take 1 tablet (800 mg total) every 8 (eight) hours as needed by mouth. 90 tablet 1  . losartan (COZAAR) 25 MG tablet Take 1 tablet (25 mg total) by mouth daily. 90 tablet 1  . metFORMIN (GLUCOPHAGE) 500 MG tablet TAKE TWO TABLET EVERY MORNING, THEN  ONE TABLET AT NOON, THEN TWO TABLETS EVERY EVENING 450 tablet 1  . nitroGLYCERIN (NITROSTAT) 0.4 MG SL tablet Place 1 tablet (0.4 mg total) under the tongue every 5 (five) minutes as needed for chest pain. 25 tablet 3  . pantoprazole (PROTONIX) 40 MG tablet TAKE (1) TABLET BY MOUTH ONCE DAILY. 90 tablet 1  . RANEXA 500 MG 12 hr tablet TAKE ONE TABLET TWICE DAILY (Patient taking differently: TAKE 1 TABLET BY MOUTH DAILY) 60 tablet 6  . ranitidine (ZANTAC) 150 MG tablet Take 150 mg by mouth daily as needed for heartburn.    . sertraline (ZOLOFT) 100 MG tablet Take 1 tablet (100 mg total) by mouth daily. 30 tablet 2   No current facility-administered medications for this visit.      Musculoskeletal: Strength & Muscle Tone: within normal limits Gait & Station: normal Patient leans: N/A  Psychiatric Specialty Exam: Review of Systems  Musculoskeletal: Positive for joint pain.  All other systems reviewed and are negative.   Blood pressure 118/72, pulse 83, height 6' 2"  (1.88 m), weight 236 lb (107 kg), SpO2 98 %.Body mass index is 30.3 kg/m.  General Appearance: Casual  Eye Contact:  Good  Speech:  Clear and Coherent  Volume:  Normal  Mood:  Euthymic  Affect:  Appropriate  Thought Process:  Goal Directed  Orientation:  Full (Time, Place, and Person)  Thought Content: WDL   Suicidal Thoughts:  No  Homicidal Thoughts:  No  Memory:  Immediate;   Good Recent;   Good Remote;   Good  Judgement:  Good  Insight:  Fair  Psychomotor Activity:  Normal  Concentration:  Concentration: Good and Attention Span: Good  Recall:  Good  Fund of Knowledge: Good  Language: Good  Akathisia:  No  Handed:  Right  AIMS (if indicated): not done  Assets:  Communication Skills Desire for Improvement Resilience  Social Support Talents/Skills  ADL's:  Intact  Cognition: WNL  Sleep:  Good   Screenings: PHQ2-9     Office Visit from 08/02/2017 in [facility]  PHQ-2 Total Score  0        Assessment and Plan: This patient is a 56 year old male with a history of depression and anxiety.  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  He has had more stress recently with his wife's illness but is handling it well.  He will continue Zoloft 100 mg daily for depression and Xanax 0.5 mg 3 times a day for anxiety.  He will return to see me in 3 months   [name], MD 08/07/2017, 10:06 AM

## 2017-08-11 ENCOUNTER — Encounter: Payer: Self-pay | Admitting: Family Medicine

## 2017-08-13 ENCOUNTER — Ambulatory Visit (INDEPENDENT_AMBULATORY_CARE_PROVIDER_SITE_OTHER): Payer: Worker's Compensation | Admitting: Orthopedic Surgery

## 2017-08-13 ENCOUNTER — Encounter: Payer: Self-pay | Admitting: Orthopedic Surgery

## 2017-08-13 VITALS — BP 117/79 | HR 88 | Ht 74.0 in | Wt 235.0 lb

## 2017-08-13 DIAGNOSIS — Z4889 Encounter for other specified surgical aftercare: Secondary | ICD-10-CM | POA: Insufficient documentation

## 2017-08-13 DIAGNOSIS — M7711 Lateral epicondylitis, right elbow: Secondary | ICD-10-CM

## 2017-08-13 MED ORDER — HYDROCODONE-ACETAMINOPHEN 5-325 MG PO TABS: 1.0000 | ORAL_TABLET | Freq: Four times a day (QID) | ORAL | 0 refills | Status: DC | PRN

## 2017-08-13 NOTE — Progress Notes (Signed)
Chief Complaint  Patient presents with  . Elbow Pain    right  . Post-op Follow-up    07/25/17   19 days post op   Encounter Diagnoses  Name Primary?  . Lateral epicondylitis of right elbow s/p release 07/25/17 Yes  . Aftercare following surgery    Patient complains of twitching in the muscle of his right arm.  He said he had some of this during therapy and it went away and now it is coming back  I do not see any cause or reason for that.  He does want a refill on his hydrocodone he says he is having trouble sleeping at night and intermittent sharp pain but primarily soreness over the right elbow  The wound looks clean his range of motion is extension 5 degrees flexion full pronation with his arm at his side 90 degrees supination 80 degrees  Recommend refill on hydrocodone x1 then start Tylenol heat Biofreeze and ibuprofen  Follow-up 5 weeks

## 2017-08-13 NOTE — Patient Instructions (Signed)
Start biofreeze heat and ibuprofen tylenol after norco runs out

## 2017-08-15 ENCOUNTER — Other Ambulatory Visit: Payer: Self-pay | Admitting: *Deleted

## 2017-08-15 MED ORDER — RANOLAZINE ER 500 MG PO TB12: 500.0000 mg | ORAL_TABLET | Freq: Two times a day (BID) | ORAL | 6 refills | Status: DC

## 2017-08-22 ENCOUNTER — Other Ambulatory Visit: Payer: Self-pay | Admitting: Family Medicine

## 2017-08-23 ENCOUNTER — Telehealth (INDEPENDENT_AMBULATORY_CARE_PROVIDER_SITE_OTHER): Payer: Self-pay | Admitting: *Deleted

## 2017-08-23 ENCOUNTER — Encounter (INDEPENDENT_AMBULATORY_CARE_PROVIDER_SITE_OTHER): Payer: Self-pay | Admitting: *Deleted

## 2017-08-23 NOTE — Telephone Encounter (Signed)
Patient needs trilyte 

## 2017-08-28 MED ORDER — PEG 3350-KCL-NA BICARB-NACL 420 G PO SOLR: 4000.0000 mL | Freq: Once | ORAL | 0 refills | Status: AC

## 2017-09-12 ENCOUNTER — Telehealth (INDEPENDENT_AMBULATORY_CARE_PROVIDER_SITE_OTHER): Payer: Self-pay | Admitting: *Deleted

## 2017-09-12 NOTE — Telephone Encounter (Signed)
Referring MD/PCP: steve luking   Procedure: tcs  Reason/Indication:  Hx polyps  Has patient had this procedure before?  Yes, 2013  If so, when, by whom and where?    Is there a family history of colon cancer?  no  Who?  What age when diagnosed?    Is patient diabetic?   yes      Does patient have prosthetic heart valve or mechanical valve?  no  Do you have a pacemaker?  no  Has patient ever had endocarditis? no  Has patient had joint replacement within last 12 months?  no  Is patient constipated or take laxatives? no  Does patient have a history of alcohol/drug use?  no  Is patient on Coumadin, Plavix and/or Aspirin? yes  Medications: see epic  Allergies: see epic  Medication Adjustment per Dr Laural Golden: asa 2 days, don't take diabetic meds evening before & morning of  Procedure date & time: 10/17/17 at 1225

## 2017-09-12 NOTE — Telephone Encounter (Signed)
agree

## 2017-09-18 ENCOUNTER — Ambulatory Visit: Payer: PRIVATE HEALTH INSURANCE | Admitting: Family Medicine

## 2017-09-18 ENCOUNTER — Telehealth: Payer: Self-pay | Admitting: Family Medicine

## 2017-09-18 ENCOUNTER — Encounter: Payer: Self-pay | Admitting: Orthopedic Surgery

## 2017-09-18 ENCOUNTER — Encounter: Payer: Self-pay | Admitting: Family Medicine

## 2017-09-18 ENCOUNTER — Ambulatory Visit (INDEPENDENT_AMBULATORY_CARE_PROVIDER_SITE_OTHER): Payer: Worker's Compensation | Admitting: Orthopedic Surgery

## 2017-09-18 VITALS — BP 116/74 | Temp 98.4°F | Ht 74.0 in | Wt 241.0 lb

## 2017-09-18 VITALS — BP 127/84 | HR 73 | Ht 74.0 in | Wt 240.0 lb

## 2017-09-18 DIAGNOSIS — L0291 Cutaneous abscess, unspecified: Secondary | ICD-10-CM

## 2017-09-18 DIAGNOSIS — L723 Sebaceous cyst: Secondary | ICD-10-CM

## 2017-09-18 DIAGNOSIS — Z4889 Encounter for other specified surgical aftercare: Secondary | ICD-10-CM

## 2017-09-18 MED ORDER — DOXYCYCLINE HYCLATE 100 MG PO TABS: 100.0000 mg | ORAL_TABLET | Freq: Two times a day (BID) | ORAL | 0 refills | Status: DC

## 2017-09-18 NOTE — Progress Notes (Signed)
POST OP   Chief Complaint  Patient presents with  . Routine Post Op    s/p release right tennis elbow 07/25/17   POD 55  BP 127/84   Pulse 73   Ht 6' 2"  (1.88 m)   Wt 240 lb (108.9 kg)   BMI 30.81 kg/m   He complains of weakness in his right elbow.  He says it gives out.  Exam shows full range of motion in flexion-extension full supination some mild pain with pronation tenderness over the incision weakness of extension at the wrist neurovascular exam is intact  Recommend physical therapy  No work until  February 14 with a follow-up here on the 13th  He should be able to start a reduced duty work program on 14 February. Encounter Diagnosis  Name Primary?  Marland Kitchen Aftercare following surgery release right elbow lateral epicondyle 07/25/17 Yes     Current Outpatient Medications:  .  gabapentin (NEURONTIN) 300 MG capsule, Take 600 mg by mouth 2 (two) times daily. , Disp: , Rfl:  .  HYDROcodone-acetaminophen (NORCO) 5-325 MG tablet, Take 1 tablet by mouth every 6 (six) hours as needed for moderate pain., Disp: 28 tablet, Rfl: 0 .  ibuprofen (ADVIL,MOTRIN) 800 MG tablet, Take 1 tablet (800 mg total) every 8 (eight) hours as needed by mouth., Disp: 90 tablet, Rfl: 1

## 2017-09-18 NOTE — Addendum Note (Signed)
Addended byCandice Camp on: 09/18/2017 11:54 AM   Modules accepted: Orders

## 2017-09-18 NOTE — Patient Instructions (Signed)
START pt   NO WORK UNTIL FEB 14

## 2017-09-18 NOTE — Progress Notes (Signed)
   Subjective:    Patient ID: Alexios Keown, male    DOB: 01/20/1961, 57 y.o.   MRN: [id number]  HPIcyst on back. Painful for about one week. Tried hot compresses.     Review of Systems No headache, no major weight loss or weight gain, no chest pain no back pain abdominal pain no change in bowel habits complete ROS otherwise negative     Objective:   Physical Exam Alert vitals stable, NAD. Blood pressure good on repeat. HEENT normal. Lungs clear. Heart regular rate and rhythm.   inflammed tend seb cyst  Pt preppeped draped aneas given, incised, pus withdrawn ,draian inserted wound care disc doxy bid, derm ref     Assessment & Plan:

## 2017-09-18 NOTE — Telephone Encounter (Signed)
Patient is aware to be here today at 4:40pm.

## 2017-09-18 NOTE — Telephone Encounter (Signed)
Please advise 

## 2017-09-18 NOTE — Telephone Encounter (Signed)
Large and infected we can incise and drain if need be and put on antibiotics acutely, this will not fix cyst but will fix acute situstion, may add on thid eve   If pt declines that go ahad and process referral but still may take awhile

## 2017-09-18 NOTE — Telephone Encounter (Signed)
Patient said that Dr. Richardson Landry had looked at a large bump/possible cyst on his back that last time he was seen in November and told him to see dermatology.  He said it has gotten bigger and he believes its now infected.  He tried calling Dr. Payton Emerald office.  Without a referral, they cant see him for another 2 months.  He is requesting a referral to Dr. Payton Emerald office ASAP.

## 2017-10-03 DIAGNOSIS — N6009 Solitary cyst of unspecified breast: Secondary | ICD-10-CM

## 2017-10-03 DIAGNOSIS — L729 Follicular cyst of the skin and subcutaneous tissue, unspecified: Secondary | ICD-10-CM

## 2017-10-03 HISTORY — DX: Follicular cyst of the skin and subcutaneous tissue, unspecified: L72.9

## 2017-10-03 HISTORY — DX: Solitary cyst of unspecified breast: N60.09

## 2017-10-17 ENCOUNTER — Other Ambulatory Visit: Payer: Self-pay

## 2017-10-17 ENCOUNTER — Encounter (HOSPITAL_COMMUNITY): Payer: Self-pay | Admitting: *Deleted

## 2017-10-17 ENCOUNTER — Ambulatory Visit (HOSPITAL_COMMUNITY)
Admission: RE | Admit: 2017-10-17 | Discharge: 2017-10-17 | Disposition: A | Discharge status: Home / Self Care | Payer: PRIVATE HEALTH INSURANCE | Source: Ambulatory Visit | Attending: Internal Medicine | Admitting: Internal Medicine

## 2017-10-17 ENCOUNTER — Encounter (HOSPITAL_COMMUNITY)
Admission: RE | Disposition: A | Discharge status: Home / Self Care | Payer: Self-pay | Source: Ambulatory Visit | Attending: Internal Medicine

## 2017-10-17 DIAGNOSIS — E782 Mixed hyperlipidemia: Secondary | ICD-10-CM | POA: Diagnosis not present

## 2017-10-17 DIAGNOSIS — F329 Major depressive disorder, single episode, unspecified: Secondary | ICD-10-CM | POA: Insufficient documentation

## 2017-10-17 DIAGNOSIS — K648 Other hemorrhoids: Secondary | ICD-10-CM

## 2017-10-17 DIAGNOSIS — D125 Benign neoplasm of sigmoid colon: Secondary | ICD-10-CM | POA: Insufficient documentation

## 2017-10-17 DIAGNOSIS — Z86010 Personal history of colonic polyps: Secondary | ICD-10-CM | POA: Insufficient documentation

## 2017-10-17 DIAGNOSIS — E119 Type 2 diabetes mellitus without complications: Secondary | ICD-10-CM | POA: Insufficient documentation

## 2017-10-17 DIAGNOSIS — F419 Anxiety disorder, unspecified: Secondary | ICD-10-CM | POA: Diagnosis not present

## 2017-10-17 DIAGNOSIS — Z888 Allergy status to other drugs, medicaments and biological substances status: Secondary | ICD-10-CM | POA: Insufficient documentation

## 2017-10-17 DIAGNOSIS — K760 Fatty (change of) liver, not elsewhere classified: Secondary | ICD-10-CM | POA: Insufficient documentation

## 2017-10-17 DIAGNOSIS — K7460 Unspecified cirrhosis of liver: Secondary | ICD-10-CM | POA: Diagnosis not present

## 2017-10-17 DIAGNOSIS — Z7984 Long term (current) use of oral hypoglycemic drugs: Secondary | ICD-10-CM | POA: Diagnosis not present

## 2017-10-17 DIAGNOSIS — Z1211 Encounter for screening for malignant neoplasm of colon: Secondary | ICD-10-CM | POA: Diagnosis present

## 2017-10-17 DIAGNOSIS — K644 Residual hemorrhoidal skin tags: Secondary | ICD-10-CM | POA: Insufficient documentation

## 2017-10-17 DIAGNOSIS — I2510 Atherosclerotic heart disease of native coronary artery without angina pectoris: Secondary | ICD-10-CM | POA: Diagnosis not present

## 2017-10-17 DIAGNOSIS — Z09 Encounter for follow-up examination after completed treatment for conditions other than malignant neoplasm: Secondary | ICD-10-CM | POA: Diagnosis not present

## 2017-10-17 DIAGNOSIS — D123 Benign neoplasm of transverse colon: Secondary | ICD-10-CM

## 2017-10-17 DIAGNOSIS — I10 Essential (primary) hypertension: Secondary | ICD-10-CM | POA: Diagnosis not present

## 2017-10-17 DIAGNOSIS — Z96651 Presence of right artificial knee joint: Secondary | ICD-10-CM | POA: Diagnosis not present

## 2017-10-17 DIAGNOSIS — Z7982 Long term (current) use of aspirin: Secondary | ICD-10-CM | POA: Insufficient documentation

## 2017-10-17 DIAGNOSIS — Z87891 Personal history of nicotine dependence: Secondary | ICD-10-CM | POA: Diagnosis not present

## 2017-10-17 DIAGNOSIS — Z79899 Other long term (current) drug therapy: Secondary | ICD-10-CM | POA: Diagnosis not present

## 2017-10-17 DIAGNOSIS — Z955 Presence of coronary angioplasty implant and graft: Secondary | ICD-10-CM | POA: Insufficient documentation

## 2017-10-17 DIAGNOSIS — D122 Benign neoplasm of ascending colon: Secondary | ICD-10-CM

## 2017-10-17 HISTORY — PX: POLYPECTOMY: SHX5525

## 2017-10-17 HISTORY — PX: COLONOSCOPY: SHX5424

## 2017-10-17 HISTORY — DX: Unspecified injury of unspecified elbow, initial encounter: S59.909A

## 2017-10-17 HISTORY — DX: Solitary cyst of unspecified breast: N60.09

## 2017-10-17 LAB — GLUCOSE, CAPILLARY: Glucose-Capillary: 188 mg/dL — ABNORMAL HIGH (ref 65–99)

## 2017-10-17 SURGERY — COLONOSCOPY
Anesthesia: Moderate Sedation

## 2017-10-17 MED ORDER — MIDAZOLAM HCL 5 MG/5ML IJ SOLN
INTRAMUSCULAR | Status: AC
  Filled 2017-10-17: qty 10

## 2017-10-17 MED ORDER — MEPERIDINE HCL 50 MG/ML IJ SOLN
INTRAMUSCULAR | Status: DC | PRN
  Administered 2017-10-17 (×2): 25 mg via INTRAVENOUS

## 2017-10-17 MED ORDER — MEPERIDINE HCL 50 MG/ML IJ SOLN
INTRAMUSCULAR | Status: AC
  Filled 2017-10-17: qty 1

## 2017-10-17 MED ORDER — MIDAZOLAM HCL 5 MG/5ML IJ SOLN
INTRAMUSCULAR | Status: DC | PRN
  Administered 2017-10-17 (×5): 2 mg via INTRAVENOUS

## 2017-10-17 MED ORDER — STERILE WATER FOR IRRIGATION IR SOLN
Status: DC | PRN
  Administered 2017-10-17: 2.5 mL

## 2017-10-17 MED ORDER — SODIUM CHLORIDE 0.9 % IV SOLN
INTRAVENOUS | Status: DC
  Administered 2017-10-17: 11:00:00 via INTRAVENOUS

## 2017-10-17 NOTE — Discharge Instructions (Signed)
Colon Polyps Polyps are tissue growths inside the body. Polyps can grow in many places, including the large intestine (colon). A polyp may be a round bump or a mushroom-shaped growth. You could have one polyp or several. Most colon polyps are noncancerous (benign). However, some colon polyps can become cancerous over time. What are the causes? The exact cause of colon polyps is not known. What increases the risk? This condition is more likely to develop in people who:  Have a family history of colon cancer or colon polyps.  Are older than 33 or older than 45 if they are African American.  Have inflammatory bowel disease, such as ulcerative colitis or Crohn disease.  Are overweight.  Smoke cigarettes.  Do not get enough exercise.  Drink too much alcohol.  Eat a diet that is: ? High in fat and red meat. ? Low in fiber.  Had childhood cancer that was treated with abdominal radiation.  What are the signs or symptoms? Most polyps do not cause symptoms. If you have symptoms, they may include:  Blood coming from your rectum when having a bowel movement.  Blood in your stool.The stool may look dark red or black.  A change in bowel habits, such as constipation or diarrhea.  How is this diagnosed? This condition is diagnosed with a colonoscopy. This is a procedure that uses a lighted, flexible scope to look at the inside of your colon. How is this treated? Treatment for this condition involves removing any polyps that are found. Those polyps will then be tested for cancer. If cancer is found, your health care provider will talk to you about options for colon cancer treatment. Follow these instructions at home: Diet  Eat plenty of fiber, such as fruits, vegetables, and whole grains.  Eat foods that are high in calcium and vitamin D, such as milk, cheese, yogurt, eggs, liver, fish, and broccoli.  Limit foods high in fat, red meats, and processed meats, such as hot dogs,  sausage, bacon, and lunch meats.  Maintain a healthy weight, or lose weight if recommended by your health care provider. General instructions  Do not smoke cigarettes.  Do not drink alcohol excessively.  Keep all follow-up visits as told by your health care provider. This is important. This includes keeping regularly scheduled colonoscopies. Talk to your health care provider about when you need a colonoscopy.  Exercise every day or as told by your health care provider. Contact a health care provider if:  You have new or worsening bleeding during a bowel movement.  You have new or increased blood in your stool.  You have a change in bowel habits.  You unexpectedly lose weight. This information is not intended to replace advice given to you by your health care provider. Make sure you discuss any questions you have with your health care provider. Document Released: 05/30/2004 Document Revised: 02/09/2016 Document Reviewed: 07/25/2015 Elsevier Interactive Patient Education  2018 [employer].  Colonoscopy, Adult, Care After This sheet gives you information about how to care for yourself after your procedure. Your health care provider may also give you more specific instructions. If you have problems or questions, contact your health care provider. What can I expect after the procedure? After the procedure, it is common to have:  A small amount of blood in your stool for 24 hours after the procedure.  Some gas.  Mild abdominal cramping or bloating.  Follow these instructions at home: General instructions   For the first 24  hours after the procedure: ? Do not drive or use machinery. ? Do not sign important documents. ? Do not drink alcohol. ? Do your regular daily activities at a slower pace than normal. ? Eat soft, easy-to-digest foods. ? Rest often.  Take over-the-counter or prescription medicines only as told by your health care provider.  It is up to you to get the  results of your procedure. Ask your health care provider, or the department performing the procedure, when your results will be ready. Relieving cramping and bloating  Try walking around when you have cramps or feel bloated.  Apply heat to your abdomen as told by your health care provider. Use a heat source that your health care provider recommends, such as a moist heat pack or a heating pad. ? Place a towel between your skin and the heat source. ? Leave the heat on for 20-30 minutes. ? Remove the heat if your skin turns bright red. This is especially important if you are unable to feel pain, heat, or cold. You may have a greater risk of getting burned. Eating and drinking  Drink enough fluid to keep your urine clear or pale yellow.  Resume your normal diet as instructed by your health care provider. Avoid heavy or fried foods that are hard to digest.  Avoid drinking alcohol for as long as instructed by your health care provider. Contact a health care provider if:  You have blood in your stool 2-3 days after the procedure. Get help right away if:  You have more than a small spotting of blood in your stool.  You pass large blood clots in your stool.  Your abdomen is swollen.  You have nausea or vomiting.  You have a fever.  You have increasing abdominal pain that is not relieved with medicine. This information is not intended to replace advice given to you by your health care provider. Make sure you discuss any questions you have with your health care provider. Document Released: 04/17/2004 Document Revised: 05/28/2016 Document Reviewed: 11/15/2015 Elsevier Interactive Patient Education  2018 [employer]. No aspirin or NSAIDs for 1 week. Resume other medications as before. Resume usual diet. No driving for 24 hours. Physician will call with biopsy results.

## 2017-10-17 NOTE — H&P (Signed)
Jared Griffin is an 57 y.o. male.   Chief Complaint: Patient is here for colonoscopy. HPI: Patient is 57 year old Caucasian male who has a history of colonic adenomas and is here for surveillance colonoscopy.  He denies abdominal pain change in bowel habits or rectal bleeding. Last colonoscopy was in October 2013 with removal of 4 small polyps and these were all tubular adenomas. Family history is negative for CRC.  Past Medical History:  Diagnosis Date  . Anxiety   . 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 Coronary atherosclerosis of native coronary artery    a. 12/09/2013: s/p PCI with 3.0 x 28 Promus DES to mLAD  . Cyst (solitary) of breast 10/03/2017   removed from back  . Depression   . Elbow injury 07/25/2017   surgery for torn tendon  . Essential hypertension, benign   . Fatty liver disease, nonalcoholic   . Lateral epicondylitis of right elbow   . Mixed hyperlipidemia 2006  . Obesity   . Osgood-Schlatter's disease of right knee   . Type 2 diabetes mellitus ([place])     Past Surgical History:  Procedure Laterality Date  . CARDIAC CATHETERIZATION  2011  . CARDIAC CATHETERIZATION N/A 09/20/2015   Procedure: Left Heart Cath and Coronary Angiography;  Surgeon: [name], MD;  Location: [facility];  Service: Cardiovascular;  Laterality: N/A;  . CHOLECYSTECTOMY  2003  . COLONOSCOPY  06/19/2012   Procedure: COLONOSCOPY;  Surgeon: [name], MD;  Location: [facility];  Service: Endoscopy;  Laterality: N/A;  930  . KNEE ARTHROPLASTY Right 1999  . LEFT HEART CATHETERIZATION WITH CORONARY ANGIOGRAM N/A 12/09/2013   Procedure: LEFT HEART CATHETERIZATION WITH CORONARY ANGIOGRAM;  Surgeon: [name], MD;  Location: [facility] CATH LAB;  Service: Cardiovascular;  Laterality: N/A;  . Liver biopsy    . TENNIS ELBOW RELEASE/NIRSCHEL PROCEDURE Right 07/25/2017   Procedure: TENNIS ELBOW RELEASE and debriedment;  Surgeon: [name], MD;   Location: [facility];  Service: Orthopedics;  Laterality: Right;  . TONSILLECTOMY  1970's    Family History  Problem Relation Age of Onset  . Cancer Maternal Aunt   . CVA Maternal Grandmother   . CVA Maternal Grandfather    Social History:  reports that he quit smoking about 34 years ago. His smoking use included cigarettes. He started smoking about 44 years ago. He has a 5.00 pack-year smoking history. He quit smokeless tobacco use about 34 years ago. His smokeless tobacco use included chew. He reports that he does not drink alcohol or use drugs.  Allergies:  Allergies  Allergen Reactions  . Metoprolol Rash    Medications Prior to Admission  Medication Sig Dispense Refill  . acetaminophen (TYLENOL) 500 MG tablet Take 1,000 mg by mouth daily as needed for headache. Pain.    Marland Kitchen ALPRAZolam (XANAX) 0.5 MG tablet Take 1 tablet (0.5 mg total) by mouth 3 (three) times daily. 90 tablet 2  . aspirin (ASPIR-LOW) 81 MG EC tablet Take 81 mg by mouth daily.     Marland Kitchen atorvastatin (LIPITOR) 40 MG tablet TAKE ONE (1) TABLET BY MOUTH EVERY DAY 30 tablet 6  . canagliflozin (INVOKANA) 300 MG TABS tablet TAKE ONE TABLET BY MOUTH ONCE DAILY WITH BREAKFAST. 90 tablet 1  . diltiazem (CARDIZEM) 30 MG tablet Take 1 tablet (30 mg total) 2 (two) times daily by mouth. 180 tablet 1  . gabapentin (NEURONTIN) 300 MG capsule Take 600 mg by mouth 2 (two)  times daily.     Marland Kitchen glipiZIDE (GLUCOTROL) 5 MG tablet Take 2 tablets (10 mg total) 2 (two) times daily by mouth. 360 tablet 1  . losartan (COZAAR) 25 MG tablet Take 1 tablet (25 mg total) by mouth daily. 90 tablet 1  . metFORMIN (GLUCOPHAGE) 500 MG tablet TAKE TWO TABLET EVERY MORNING, THEN ONE TABLET AT NOON, THEN TWO TABLETS EVERY EVENING 450 tablet 1  . nitroGLYCERIN (NITROSTAT) 0.4 MG SL tablet Place 1 tablet (0.4 mg total) under the tongue every 5 (five) minutes as needed for chest pain. 25 tablet 3  . ondansetron (ZOFRAN) 4 MG tablet Take 4 mg by mouth every 8 (eight)  hours as needed for nausea or vomiting.    . pantoprazole (PROTONIX) 40 MG tablet TAKE (1) TABLET BY MOUTH ONCE DAILY. 90 tablet 1  . ranolazine (RANEXA) 500 MG 12 hr tablet Take 1 tablet (500 mg total) by mouth 2 (two) times daily. 60 tablet 6  . sertraline (ZOLOFT) 100 MG tablet Take 1 tablet (100 mg total) by mouth daily. 30 tablet 2  . ACCU-CHEK AVIVA PLUS test strip CHECK BLOOD SUGAR UP TO FOUR TIMES A DAY. 100 each 5  . doxycycline (VIBRA-TABS) 100 MG tablet Take 1 tablet (100 mg total) by mouth 2 (two) times daily. (Patient not taking: Reported on 10/08/2017) 14 tablet 0  . HYDROcodone-acetaminophen (NORCO) 5-325 MG tablet Take 1 tablet by mouth every 6 (six) hours as needed for moderate pain. 28 tablet 0  . ibuprofen (ADVIL,MOTRIN) 800 MG tablet Take 1 tablet (800 mg total) every 8 (eight) hours as needed by mouth. 90 tablet 1    Results for orders placed or performed during the hospital encounter of 10/17/17 (from the past 48 hour(s))  Glucose, capillary     Status: Abnormal   Collection Time: 10/17/17 11:20 AM  Result Value Ref Range   Glucose-Capillary 188 (H) 65 - 99 mg/dL   No results found.  ROS  Blood pressure 116/84, pulse 73, temperature 98.4 F (36.9 C), temperature source Oral, resp. rate 16, height 6' 2"  (1.88 m), weight 230 lb (104.3 kg), SpO2 96 %. Physical Exam  Constitutional: He appears well-developed and well-nourished.  HENT:  Mouth/Throat: Oropharynx is clear and moist.  Eyes: Conjunctivae are normal. No scleral icterus.  Neck: No thyromegaly present.  Cardiovascular: Normal rate, regular rhythm and normal heart sounds.  No murmur heard. Respiratory: Effort normal and breath sounds normal.  GI: Soft. He exhibits no distension and no mass. There is no tenderness.  Musculoskeletal: He exhibits no edema.  Lymphadenopathy:    He has no cervical adenopathy.  Neurological: He is alert.  Skin: Skin is warm and dry.     Assessment/Plan History of colonic  adenomas Surveillance colonoscopy  Hildred Laser, MD 10/17/2017, 11:47 AM

## 2017-10-17 NOTE — Op Note (Signed)
[facility] Patient Name: Jared Griffin Procedure Date: 10/17/2017 11:25 AM MRN: [id number] Date of Birth: 03/26/1961 Attending MD: Hildred Laser , MD CSN: [id number] Age: 57 Admit Type: Outpatient Procedure:                Colonoscopy Indications:              High risk colon cancer surveillance: Personal                            history of colonic polyps Providers:                Hildred Laser, MD, Lurline Del, RN, Nelma Rothman,                            Technician Referring MD:             Grace Bushy. Wolfgang Phoenix, MD Medicines:                Meperidine 50 mg IV, Midazolam 10 mg IV Complications:            No immediate complications. Estimated Blood Loss:     Estimated blood loss was minimal. Procedure:                Pre-Anesthesia Assessment:                           - Prior to the procedure, a History and Physical                            was performed, and patient medications and                            allergies were reviewed. The patient's tolerance of                            previous anesthesia was also reviewed. The risks                            and benefits of the procedure and the sedation                            options and risks were discussed with the patient.                            All questions were answered, and informed consent                            was obtained. Prior Anticoagulants: The patient                            last took aspirin 3 days prior to the procedure.                            ASA Grade Assessment: II - A patient with mild  systemic disease. After reviewing the risks and                            benefits, the patient was deemed in satisfactory                            condition to undergo the procedure.                           After obtaining informed consent, the colonoscope                            was passed under direct vision. Throughout the                            procedure,  the patient's blood pressure, pulse, and                            oxygen saturations were monitored continuously. The                            EC-[id number] ([id number]) was introduced through the                            anus and advanced to the the cecum, identified by                            appendiceal orifice and ileocecal valve. The                            colonoscopy was performed without difficulty. The                            patient tolerated the procedure well. The quality                            of the bowel preparation was adequate. The                            ileocecal valve, appendiceal orifice, and rectum                            were photographed. Scope In: 12:04:38 PM Scope Out: 12:29:31 PM Scope Withdrawal Time: 0 hours 21 minutes 24 seconds  Total Procedure Duration: 0 hours 24 minutes 53 seconds  Findings:      The perianal exam findings include skin tags.      Three semi-sessile polyps were found in the sigmoid colon and ascending       colon. The polyps were 6 to 7 mm in size. These polyps were removed with       a hot snare. Resection and retrieval were complete. The pathology       specimen was placed into Bottle Number 1.      A 5 mm polyp was found in the splenic flexure. The polyp was sessile.  The polyp was removed with a cold snare. Resection and retrieval were       complete. The pathology specimen was placed into Bottle Number 1.      Internal hemorrhoids were found during retroflexion. The hemorrhoids       were small.      There was 12 mm in diameter, at the hepatic flexure. Impression:               - Perianal skin tags found on perianal exam.                           - Three 6 to 7 mm polyps in the sigmoid colon and                            in the ascending colon, removed with a hot snare.                            Resected and retrieved.                           - One 5 mm polyp at the splenic flexure, removed                             with a cold snare. Resected and retrieved.                           - 12 mm lipoma at hepatic flexure.                           - Internal hemorrhoids. Moderate Sedation:      Moderate (conscious) sedation was administered by the endoscopy nurse       and supervised by the endoscopist. The following parameters were       monitored: oxygen saturation, heart rate, blood pressure, CO2       capnography and response to care. Total physician intraservice time was       34 minutes. Recommendation:           - Patient has a contact number available for                            emergencies. The signs and symptoms of potential                            delayed complications were discussed with the                            patient. Return to normal activities tomorrow.                            Written discharge instructions were provided to the                            patient.                           -  Resume previous diet today.                           - Continue present medications.                           - No aspirin, ibuprofen, naproxen, or other                            non-steroidal anti-inflammatory drugs for 7 days                            after polyp removal.                           - Await pathology results.                           - Repeat colonoscopy in 5 years for surveillance. Procedure Code(s):        --- Professional ---                           [phone], Colonoscopy, flexible; with removal of                            tumor(s), polyp(s), or other lesion(s) by snare                            technique                           99152, Moderate sedation services provided by the                            same physician or other qualified health care                            professional performing the diagnostic or                            therapeutic service that the sedation supports,                            requiring the presence of  an independent trained                            observer to assist in the monitoring of the                            patient's level of consciousness and physiological                            status; initial 15 minutes of intraservice time,                            patient age 83 years  or older                           [phone], Moderate sedation services; each additional                            15 minutes intraservice time Diagnosis Code(s):        --- Professional ---                           Z86.010, Personal history of colonic polyps                           D12.5, Benign neoplasm of sigmoid colon                           D12.2, Benign neoplasm of ascending colon                           D12.3, Benign neoplasm of transverse colon (hepatic                            flexure or splenic flexure)                           K64.4, Residual hemorrhoidal skin tags                           K64.8, Other hemorrhoids CPT copyright 2016 [employer]. All rights reserved. The codes documented in this report are preliminary and upon coder review may  be revised to meet current compliance requirements. Hildred Laser, MD Hildred Laser, MD 10/17/2017 12:41:51 PM This report has been signed electronically. Number of Addenda: 0

## 2017-10-21 ENCOUNTER — Encounter (HOSPITAL_COMMUNITY): Payer: Self-pay | Admitting: Internal Medicine

## 2017-11-05 ENCOUNTER — Encounter: Payer: Self-pay | Admitting: Family Medicine

## 2017-11-05 ENCOUNTER — Ambulatory Visit: Payer: PRIVATE HEALTH INSURANCE | Admitting: Family Medicine

## 2017-11-05 VITALS — BP 112/82 | Temp 98.1°F | Ht 74.0 in | Wt 239.0 lb

## 2017-11-05 DIAGNOSIS — J329 Chronic sinusitis, unspecified: Secondary | ICD-10-CM

## 2017-11-05 DIAGNOSIS — J310 Chronic rhinitis: Secondary | ICD-10-CM

## 2017-11-05 MED ORDER — AMOXICILLIN-POT CLAVULANATE 875-125 MG PO TABS: ORAL_TABLET | ORAL | 0 refills | Status: DC

## 2017-11-05 NOTE — Progress Notes (Signed)
   Subjective:    Patient ID: Jared Griffin, male    DOB: 19-May-1961, 57 y.o.   MRN: [id number]  HPI  Patient is here today with complaints of head and chest congestion and chills, lost voice, pressure in  Both ears and he does not think he has a decrease in hearing.He has a productive cough, headache and sore throat. This had started five days ago. He is using sudafed which is not helping.   HAD SOME CONG AN   Has had bit of dry cough   Headache was sig   blowing pos greenish disch  Energy pk   Not achey   Diet ojk   Pos sore throat  Review of Systems No headache, no major weight loss or weight gain, no chest pain no back pain abdominal pain no change in bowel habits complete ROS otherwise negative     Objective:   Physical Exam Alert, mild malaise. Hydration good Vitals stable. frontal/ maxillary tenderness evident positive nasal congestion. pharynx normal neck supple  lungs clear/no crackles or wheezes. heart regular in rhythm        Assessment & Plan:  Impression rhinosinusitis likely post viral, discussed with patient. plan antibiotics prescribed. Questions answered. Symptomatic care discussed. warning signs discussed. WSL Potentially started with attenuated flu

## 2017-11-06 ENCOUNTER — Encounter: Payer: Self-pay | Admitting: Orthopedic Surgery

## 2017-11-06 ENCOUNTER — Ambulatory Visit (INDEPENDENT_AMBULATORY_CARE_PROVIDER_SITE_OTHER): Payer: Worker's Compensation | Admitting: Orthopedic Surgery

## 2017-11-06 VITALS — BP 125/85 | HR 83 | Ht 74.0 in | Wt 236.0 lb

## 2017-11-06 DIAGNOSIS — Z4889 Encounter for other specified surgical aftercare: Secondary | ICD-10-CM

## 2017-11-06 DIAGNOSIS — M7711 Lateral epicondylitis, right elbow: Secondary | ICD-10-CM

## 2017-11-06 NOTE — Progress Notes (Signed)
Post op   Chief Complaint  Patient presents with  . Elbow Problem    Status post tennis elbow release right elbow, surgery date was November 8   BP 125/85   Pulse 83   Ht 6' 2"  (1.88 m)   Wt 236 lb (107 kg)   BMI 30.30 kg/m   Encounter Diagnoses  Name Primary?  . Lateral epicondylitis of right elbow s/p Release 07/25/17 Yes  . Aftercare following surgery 07/25/17    C/o   Status post right tennis elbow release  Therapy notes indicate the following: 5 out of 10 pain   , Poor rehab potential, progress towards goal only fair tolerance for treatment only fair  Recommend FCE Plan: Continue therapy Reduce opioid consumption Continue out of work status Out of work 6 weeks   continue therapy   Apply icy hot to elbow 4 x a day   To prevent complications related to opioids take a little hydrocodone as possible. Use alternative meds like tylenol and topical muscle creams. By the next office visit you should be opioid free as far as you elbow.

## 2017-11-06 NOTE — Patient Instructions (Addendum)
Out of work 6 weeks   continue therapy   Apply icy hot to elbow 4 x a day   To prevent complications related to opioids take a little hydrocodone as possible. Use alternative meds like tylenol and topical muscle creams. By the next office visit you should be opioid free as far as you elbow.

## 2017-11-11 ENCOUNTER — Telehealth: Payer: Self-pay | Admitting: Orthopedic Surgery

## 2017-11-11 ENCOUNTER — Ambulatory Visit (HOSPITAL_COMMUNITY): Payer: PRIVATE HEALTH INSURANCE | Admitting: Psychiatry

## 2017-11-11 NOTE — Telephone Encounter (Signed)
Patient called with questions following office visit 11/06/17 - asking if Dr [name] is currently working on requesting an MRI (not seeing in office notes for this visit); also asking about what Dr [name] mentioned in reference to work restrictions. Cell ph# [phone].

## 2017-11-11 NOTE — Telephone Encounter (Signed)
MRI???  Work no work until [employer]

## 2017-11-11 NOTE — Telephone Encounter (Signed)
He needs a Chief Executive Officer, can you work on workers Stage manager for this ?

## 2017-11-12 NOTE — Telephone Encounter (Signed)
Yes. Notes had been faxed on 11/07/17. Sent follow up request regarding status 11/12/17. Patient aware.

## 2017-11-18 ENCOUNTER — Other Ambulatory Visit (HOSPITAL_COMMUNITY): Payer: Self-pay | Admitting: Psychiatry

## 2017-11-18 ENCOUNTER — Other Ambulatory Visit: Payer: Self-pay | Admitting: Family Medicine

## 2017-11-18 MED ORDER — SERTRALINE HCL 100 MG PO TABS: 100.0000 mg | ORAL_TABLET | Freq: Every day | ORAL | 2 refills | Status: DC

## 2017-11-20 ENCOUNTER — Encounter (HOSPITAL_COMMUNITY): Payer: Self-pay | Admitting: Psychiatry

## 2017-11-20 ENCOUNTER — Ambulatory Visit (INDEPENDENT_AMBULATORY_CARE_PROVIDER_SITE_OTHER): Payer: PRIVATE HEALTH INSURANCE | Admitting: Psychiatry

## 2017-11-20 VITALS — BP 124/82 | HR 94 | Ht 74.0 in | Wt 236.0 lb

## 2017-11-20 DIAGNOSIS — Z87891 Personal history of nicotine dependence: Secondary | ICD-10-CM | POA: Diagnosis not present

## 2017-11-20 DIAGNOSIS — F321 Major depressive disorder, single episode, moderate: Secondary | ICD-10-CM | POA: Diagnosis not present

## 2017-11-20 MED ORDER — ALPRAZOLAM 0.5 MG PO TABS: 0.5000 mg | ORAL_TABLET | Freq: Three times a day (TID) | ORAL | 2 refills | Status: DC

## 2017-11-20 MED ORDER — SERTRALINE HCL 100 MG PO TABS: 100.0000 mg | ORAL_TABLET | Freq: Every day | ORAL | 2 refills | Status: DC

## 2017-11-20 NOTE — Progress Notes (Signed)
[facility] MD/PA/NP OP Progress Note  11/20/2017 2:41 PM Jared Griffin  MRN:  [id number]  Chief Complaint:  Chief Complaint    Depression; Anxiety; Follow-up     HPI: This patient is a 57 year old married white male lives with his wife and 28 year old son in [place]. 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  Recently he has been working at [employer] zone  The patient was referred by his primary physician, Dr. [name], for further assessment and treatment of anxiety and depression.  The patient states that he is become increasingly depressed and anxious over the last couple of years due to work stress. He stated that he was always under pressure to get more things done than he had time to do. 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 He got to the point of hating going to work. Several years ago he had cardiac problems and had a stent placed. In January of this year he began developing dizzy spells chest pain headaches stomach aches. It got so bad that he was readmitted for cardiac catheterization but nothing new was found.  The patient was having difficulty sleeping constant worry and anxiety about his job sadness and anger. He was getting irritable with people at home. After talking to his primary care physician at length and decided to take him out on medical retirement. The patient has been on Wellbutrin SR 150 no gram twice a day for several weeks now. He had also been out in the past when his wife was sick. He has not seen any benefit from this but does feel tremendously better since he stopped working. He is also on Xanax 0.5 mg 3 times a day which he thinks has helped to some degree.  Since stopping working he's had a big turnaround. His sleep and energy have improved. 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  He is much less anxious and is no longer having the chest pain dizzy spells or stomachaches. He denies suicidal ideation or psychotic symptoms. He does not use drugs or alcohol  Patient returns after 3 months.  He states that he had lifted too much stuff at work and injured his right elbow.  He had surgery on his elbow in November.  Unfortunately is not healing well and is still having a lot of pain and tremor in his arm.  He is not been able to work ever since.  He is probably going to have more neurological evaluation on his arm.  Overall his mood has been good he has been less anxious and he has been sleeping well.  The Xanax continues to help his anxiety Visit Diagnosis:    ICD-10-CM   1. Moderate single current episode of major depressive disorder ([place]) F32.1     Past Psychiatric History: none  Past Medical History:  Past Medical History:  Diagnosis Date  . Anxiety   . 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 Coronary atherosclerosis of native coronary artery    a. 12/09/2013: s/p PCI with 3.0 x 28 Promus DES to mLAD  . Cyst (solitary) of breast 10/03/2017   removed from back  . Depression   . Elbow injury 07/25/2017   surgery for torn tendon  . Essential hypertension, benign   . Fatty liver disease,  nonalcoholic   . Lateral epicondylitis of right elbow   . Mixed hyperlipidemia 2006  . Obesity   . Osgood-Schlatter's disease of right knee   . Type 2 diabetes mellitus ([place])     Past Surgical History:  Procedure Laterality Date  . CARDIAC CATHETERIZATION  2011  . CARDIAC CATHETERIZATION N/A 09/20/2015   Procedure: Left Heart Cath and Coronary Angiography;  Surgeon: [name], MD;  Location: [facility];  Service: Cardiovascular;  Laterality: N/A;  . CHOLECYSTECTOMY  2003  . COLONOSCOPY  06/19/2012   Procedure: COLONOSCOPY;  Surgeon: [name], MD;  [facility];  Service: Endoscopy;  Laterality: N/A;  930  . COLONOSCOPY N/A  10/17/2017   Procedure: COLONOSCOPY;  Surgeon: [name], MD;  [facility]: [facility];  Service: Endoscopy;  Laterality: N/A;  1225  . KNEE ARTHROPLASTY Right 1999  . LEFT HEART CATHETERIZATION WITH CORONARY ANGIOGRAM N/A 12/09/2013   Procedure: LEFT HEART CATHETERIZATION WITH CORONARY ANGIOGRAM;  Surgeon: [name], MD;  Location: [facility] CATH LAB;  Service: Cardiovascular;  Laterality: N/A;  . Liver biopsy    . POLYPECTOMY  10/17/2017   Procedure: POLYPECTOMY;  Surgeon: [name], MD;  [facility];  Service: Endoscopy;;  ascending colon, splenic flexure,sigmoid x2  . TENNIS ELBOW RELEASE/NIRSCHEL PROCEDURE Right 07/25/2017   Procedure: TENNIS ELBOW RELEASE and debriedment;  Surgeon: [name], MD;  Location: [facility];  Service: Orthopedics;  Laterality: Right;  . TONSILLECTOMY  1970's    Family Psychiatric History: none  Family History:  Family History  Problem Relation Age of Onset  . Cancer Maternal Aunt   . CVA Maternal Grandmother   . CVA Maternal Grandfather     Social History:  Social History   Socioeconomic History  . Marital status: Married    Spouse name: None  . Number of children: None  . Years of education: None  . Highest education level: None  Social Needs  . Financial resource strain: None  . Food insecurity - worry: None  . Food insecurity - inability: None  . Transportation needs - medical: None  . Transportation needs - non-medical: None  Occupational History  . None  Tobacco Use  . Smoking status: Former Smoker    Packs/day: 0.50    Years: 10.00    Pack years: 5.00    Types: Cigarettes    Start date: 02/02/1973    Last attempt to quit: 09/17/1983    Years since quitting: 34.2  . Smokeless tobacco: Former Systems developer    Types: [place] date: 09/17/1983  Substance and Sexual Activity  . Alcohol use: No    Alcohol/week: 0.0 oz    Comment: 11-30-15 per pt no  . Drug use: No    Comment: 11-30-15 per pt no  .  Sexual activity: Yes  Other Topics Concern  . 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    Married with 1 child   Right handed    12 th    Very little caffeine    Allergies:  Allergies  Allergen Reactions  . Metoprolol Rash    Metabolic Disorder Labs: Lab Results  Component Value Date   HGBA1C 8.5 (H) 07/23/2017   MPG 197.25 07/23/2017   No results found for: PROLACTIN Lab Results  Component Value Date   CHOL 171 08/06/2017   TRIG 567 (HH)  08/06/2017   HDL 31 (L) 08/06/2017   CHOLHDL 5.5 (H) 08/06/2017   VLDL 27.0 02/22/2014   [place] Comment 08/06/2017   LDLCALC 70 11/24/2016   Lab Results  Component Value Date   TSH 0.458 04/13/2017   TSH 0.493 02/05/2014    Therapeutic Level Labs: No results found for: LITHIUM No results found for: VALPROATE No components found for:  CBMZ  Current Medications: Current Outpatient Medications  Medication Sig Dispense Refill  . ACCU-CHEK AVIVA PLUS test strip CHECK BLOOD SUGAR UP TO FOUR TIMES A DAY. 100 each 5  . acetaminophen (TYLENOL) 500 MG tablet Take 1,000 mg by mouth daily as needed for headache. Pain.    Marland Kitchen ALPRAZolam (XANAX) 0.5 MG tablet Take 1 tablet (0.5 mg total) by mouth 3 (three) times daily. 90 tablet 2  . amoxicillin-clavulanate (AUGMENTIN) 875-125 MG tablet One p o bid for ten d 20 tablet 0  . aspirin (ASPIR-LOW) 81 MG EC tablet Take 1 tablet (81 mg total) by mouth daily. 30 tablet 12  . atorvastatin (LIPITOR) 40 MG tablet TAKE ONE (1) TABLET BY MOUTH EVERY DAY 30 tablet 6  . diltiazem (CARDIZEM) 30 MG tablet Take 1 tablet (30 mg total) 2 (two) times daily by mouth. 180 tablet 1  . gabapentin (NEURONTIN) 300 MG capsule Take 600 mg by mouth 2 (two) times daily.     Marland Kitchen glipiZIDE (GLUCOTROL) 5 MG tablet Take 2 tablets (10 mg total) 2 (two) times daily by mouth. 360 tablet 1  . HYDROcodone-acetaminophen (NORCO) 5-325 MG tablet Take 1  tablet by mouth every 6 (six) hours as needed for moderate pain. 28 tablet 0  . INVOKANA 300 MG TABS tablet TAKE ONE TABLET BY MOUTH ONCE DAILY WITH BREAKFAST. 30 tablet 0  . losartan (COZAAR) 25 MG tablet Take 1 tablet (25 mg total) by mouth daily. 90 tablet 1  . metFORMIN (GLUCOPHAGE) 500 MG tablet TAKE TWO TABLET EVERY MORNING, THEN ONE TABLET AT NOON, THEN TWO TABLETS EVERY EVENING 450 tablet 1  . nitroGLYCERIN (NITROSTAT) 0.4 MG SL tablet Place 1 tablet (0.4 mg total) under the tongue every 5 (five) minutes as needed for chest pain. 25 tablet 3  . ondansetron (ZOFRAN) 4 MG tablet Take 4 mg by mouth every 8 (eight) hours as needed for nausea or vomiting.    . pantoprazole (PROTONIX) 40 MG tablet TAKE (1) TABLET BY MOUTH ONCE DAILY. 90 tablet 1  . ranolazine (RANEXA) 500 MG 12 hr tablet Take 1 tablet (500 mg total) by mouth 2 (two) times daily. 60 tablet 6  . sertraline (ZOLOFT) 100 MG tablet Take 1 tablet (100 mg total) by mouth daily. 30 tablet 2   No current facility-administered medications for this visit.      Musculoskeletal: Strength & Muscle Tone: within normal limits Gait & Station: normal Patient leans: N/A  Psychiatric Specialty Exam: ROS  Blood pressure 124/82, pulse 94, height 6' 2"  (1.88 m), weight 236 lb (107 kg).Body mass index is 30.3 kg/m.  General Appearance: Casual, Neat and Well Groomed  Eye Contact:  Good  Speech:  Clear and Coherent  Volume:  Normal  Mood:  Euthymic  Affect:  Congruent  Thought Process:  Goal Directed  Orientation:  Full (Time, Place, and Person)  Thought Content: WDL   Suicidal Thoughts:  No  Homicidal Thoughts:  No  Memory:  Immediate;   Good Recent;   Good Remote;   Good  Judgement:  Good  Insight:  Good  Psychomotor Activity:  Normal  Concentration:  Concentration: Good and Attention Span: Good  Recall:  Good  Fund of Knowledge: Good  Language: Good  Akathisia:  No  Handed:  Right  AIMS (if indicated): not done  Assets:   Communication Skills Desire for Improvement Resilience Social Support Talents/Skills  ADL's:  Intact  Cognition: WNL  Sleep:  Good   Screenings: PHQ2-9     Office Visit from 08/02/2017 in [facility]  PHQ-2 Total Score  0       Assessment and Plan: This patient is a 57 year old male with a history of depression and anxiety.  He is doing well on the combination of Zoloft 100 mg daily for depression and Xanax 0.5 mg 3 times daily for anxiety.  He will return to see me in 3 months   [name], MD 11/20/2017, 2:41 PM

## 2017-11-26 ENCOUNTER — Telehealth: Payer: Self-pay | Admitting: Radiology

## 2017-11-26 NOTE — Telephone Encounter (Signed)
Per fax from [facility] patient has FCE scheduled for today at noon.

## 2017-12-02 ENCOUNTER — Encounter: Payer: Self-pay | Admitting: Family Medicine

## 2017-12-02 ENCOUNTER — Ambulatory Visit: Payer: PRIVATE HEALTH INSURANCE | Admitting: Family Medicine

## 2017-12-02 VITALS — BP 120/82 | Ht 74.0 in | Wt 236.1 lb

## 2017-12-02 DIAGNOSIS — E785 Hyperlipidemia, unspecified: Secondary | ICD-10-CM

## 2017-12-02 DIAGNOSIS — E119 Type 2 diabetes mellitus without complications: Secondary | ICD-10-CM | POA: Diagnosis not present

## 2017-12-02 DIAGNOSIS — Z79899 Other long term (current) drug therapy: Secondary | ICD-10-CM | POA: Diagnosis not present

## 2017-12-02 DIAGNOSIS — I10 Essential (primary) hypertension: Secondary | ICD-10-CM | POA: Diagnosis not present

## 2017-12-02 LAB — POCT GLYCOSYLATED HEMOGLOBIN (HGB A1C): Hemoglobin A1C: 7.4

## 2017-12-02 MED ORDER — CANAGLIFLOZIN 300 MG PO TABS: ORAL_TABLET | ORAL | 5 refills | Status: DC

## 2017-12-02 MED ORDER — METFORMIN HCL 500 MG PO TABS: ORAL_TABLET | ORAL | 1 refills | Status: DC

## 2017-12-02 MED ORDER — PANTOPRAZOLE SODIUM 40 MG PO TBEC: DELAYED_RELEASE_TABLET | ORAL | 1 refills | Status: DC

## 2017-12-02 MED ORDER — DILTIAZEM HCL 30 MG PO TABS: 30.0000 mg | ORAL_TABLET | Freq: Two times a day (BID) | ORAL | 1 refills | Status: DC

## 2017-12-02 MED ORDER — GLIPIZIDE 5 MG PO TABS: 10.0000 mg | ORAL_TABLET | Freq: Two times a day (BID) | ORAL | 1 refills | Status: DC

## 2017-12-02 NOTE — Progress Notes (Signed)
   Subjective:    Patient ID: Jared Griffin, male    DOB: 10/16/1960, 57 y.o.   MRN: [id number]  HPI Patient is here today for follow up on chronic health issues.He is diabetic and is on Metformin 500 mg two in am and one at lunch and two Qhs and Glipizide 5 mg two BID also Invokana 300 mg  One Q am.He eats healthy and exercises some. Does not see any specialists.He is taking Zoloft 100 mg one daily and xanax 0.5 BID  Results for orders placed or performed in visit on 12/02/17  POCT glycosylated hemoglobin (Hb A1C)  Result Value Ref Range   Hemoglobin A1C 7.4    Blood pressure medicine and blood pressure levels reviewed today with patient. Compliant with blood pressure medicine. States does not miss a dose. No obvious side effects. Blood pressure generally good when checked elsewhere. Watching salt intake.  Patient continues to take lipid medication regularly. No obvious side effects from it. Generally does not miss a dose. Prior blood work results are reviewed with patient. Patient continues to work on fat intake in diet  Patient claims compliance with diabetes medication. No obvious side effects. Reports no substantial low sugar spells. Most numbers are generally in good range when checked fasting. Generally does not miss a dose of medication. Watching diabetic diet closely  Review of Systems No headache, no major weight loss or weight gain, no chest pain no back pain abdominal pain no change in bowel habits complete ROS otherwise negative     Objective:   Physical Exam  Alert and oriented, vitals reviewed and stable, NAD ENT-TM's and ext canals WNL bilat via otoscopic exam Soft palate, tonsils and post pharynx WNL via oropharyngeal exam Neck-symmetric, no masses; thyroid nonpalpable and nontender Pulmonary-no tachypnea or accessory muscle use; Clear without wheezes via auscultation Card--no abnrml murmurs, rhythm reg and rate WNL Carotid pulses symmetric, without  bruits Impression      Assessment & Plan:  1 impression type 2 diabetes.  Control significantly improved.  Discussed.  Maintain same medications diet discussed  2.  Hypertension good control discussed to maintain same meds  3.  Hyperlipidemia.  Exact status uncertain check appropriate blood prior blood work reviewed to maintain same  #4 mental health issues currently followed by specialist  5.  Pacing disability with right arm injury and surgery discussed further recommendations based on blood work results

## 2017-12-11 ENCOUNTER — Other Ambulatory Visit: Payer: Self-pay | Admitting: Cardiovascular Disease

## 2017-12-18 ENCOUNTER — Encounter: Payer: Self-pay | Admitting: Orthopedic Surgery

## 2017-12-18 ENCOUNTER — Ambulatory Visit (INDEPENDENT_AMBULATORY_CARE_PROVIDER_SITE_OTHER): Payer: Worker's Compensation | Admitting: Orthopedic Surgery

## 2017-12-18 VITALS — BP 115/76 | HR 86 | Ht 74.0 in | Wt 241.0 lb

## 2017-12-18 DIAGNOSIS — M7711 Lateral epicondylitis, right elbow: Secondary | ICD-10-CM | POA: Diagnosis not present

## 2017-12-18 NOTE — Progress Notes (Signed)
Progress Note   Patient ID: Jared Griffin, male   DOB: 01-05-1961, 57 y.o.   MRN: [id number]  Chief Complaint  Patient presents with  . Elbow Pain    right / s/p lateral epicondyle reelase 07/25/17    57 year old male had his FCE that will be recorded into the medical record.  He will obviously have some restrictions with his return to work date is finally figured out as well as his MMI which has not been reached yet  He still complains of burning pain from his elbow to his hand with pain behind the epicondyle his scar has healed nicely there is a negative Tinel's but there is palpable pain distal to the radial head.  Questionable radial nerve impingement.  Recommend nerve conduction study.  He also has an unexplained tremor in his right upper extremity which is worsened since the surgery    Review of Systems  Neurological: Positive for tremors, sensory change and focal weakness.   Current Meds  Medication Sig  . ACCU-CHEK AVIVA PLUS test strip CHECK BLOOD SUGAR UP TO FOUR TIMES A DAY.  Marland Kitchen acetaminophen (TYLENOL) 500 MG tablet Take 1,000 mg by mouth daily as needed for headache. Pain.  Marland Kitchen ALPRAZolam (XANAX) 0.5 MG tablet Take 1 tablet (0.5 mg total) by mouth 3 (three) times daily.  Marland Kitchen aspirin (ASPIR-LOW) 81 MG EC tablet Take 1 tablet (81 mg total) by mouth daily.  Marland Kitchen atorvastatin (LIPITOR) 40 MG tablet TAKE ONE (1) TABLET BY MOUTH EVERY DAY  . canagliflozin (INVOKANA) 300 MG TABS tablet TAKE ONE TABLET BY MOUTH ONCE DAILY WITH BREAKFAST.  Marland Kitchen diltiazem (CARDIZEM) 30 MG tablet Take 1 tablet (30 mg total) by mouth 2 (two) times daily.  Marland Kitchen gabapentin (NEURONTIN) 300 MG capsule Take 600 mg by mouth 2 (two) times daily.   Marland Kitchen glipiZIDE (GLUCOTROL) 5 MG tablet Take 2 tablets (10 mg total) by mouth 2 (two) times daily.  Marland Kitchen HYDROcodone-acetaminophen (NORCO) 5-325 MG tablet Take 1 tablet by mouth every 6 (six) hours as needed for moderate pain.  Marland Kitchen losartan (COZAAR) 25 MG tablet TAKE ONE (1) TABLET BY  MOUTH EVERY DAY  . metFORMIN (GLUCOPHAGE) 500 MG tablet TAKE TWO TABLET EVERY MORNING, THEN ONE TABLET AT NOON, THEN TWO TABLETS EVERY EVENING  . pantoprazole (PROTONIX) 40 MG tablet TAKE (1) TABLET BY MOUTH ONCE DAILY.  . ranolazine (RANEXA) 500 MG 12 hr tablet Take 1 tablet (500 mg total) by mouth 2 (two) times daily.  . sertraline (ZOLOFT) 100 MG tablet Take 1 tablet (100 mg total) by mouth daily.    Allergies  Allergen Reactions  . Metoprolol Rash     BP 115/76   Pulse 86   Ht 6' 2"  (1.88 m)   Wt 241 lb (109.3 kg)   BMI 30.94 kg/m   Physical Exam  Constitutional: He is oriented to person, place, and time. He appears well-developed and well-nourished.  Vital signs have been reviewed and are stable. Gen. appearance the patient is well-developed and well-nourished with normal grooming and hygiene.   Musculoskeletal:       Arms: Neurological: He is alert and oriented to person, place, and time.  Skin: Skin is warm and dry. No erythema.  Psychiatric: He has a normal mood and affect.  Vitals reviewed.    Medical decision-making Encounter Diagnosis  Name Primary?  . Lateral epicondylitis of right elbow s/p release 07/25/17 Yes    Follow-up on hold  I will need a nerve study to evaluate tremor  and tingling right hand  fce read and incorporated  MMI not reached  Return to work date not determined  [place], RT 12/18/2017 9:47 AM

## 2017-12-18 NOTE — Addendum Note (Signed)
Addended byCandice Camp on: 12/18/2017 10:33 AM   Modules accepted: Orders

## 2017-12-18 NOTE — Patient Instructions (Signed)
2 months

## 2018-01-06 ENCOUNTER — Telehealth: Payer: Self-pay | Admitting: Orthopedic Surgery

## 2018-01-06 NOTE — Telephone Encounter (Signed)
At time of calling patient to notify of status of nerve conduction study, which has been approved - appointment pending per Workers comp provider [employer], patient requested refill of medication - said "has made it last until now"   HYDROcodone-acetaminophen (NORCO) 5-325 MG tablet 28 tablet  - [facility], [employer] [address]

## 2018-01-07 NOTE — Telephone Encounter (Signed)
Too FAR OUT FROM SURGERY FOR PAIN MED PRESCRIPTION

## 2018-01-13 NOTE — Telephone Encounter (Signed)
Notified patient; voiced understanding.

## 2018-01-27 ENCOUNTER — Other Ambulatory Visit: Payer: Self-pay | Admitting: Family Medicine

## 2018-01-31 ENCOUNTER — Telehealth: Payer: Self-pay | Admitting: Orthopedic Surgery

## 2018-01-31 NOTE — Telephone Encounter (Signed)
Jody from [employer] called this morning wanting to know if we have seen this patient since 12/18/17.  I told her that we had not.  She said that was the last note that she had and nothing since. She wanted to know pt's status and if he had a follow up appointment here with Dr. [name].  I told her that he did not have follow up appointment.  She then asked about any nerve study that the patient has had and the results.  I told her that I was unsure of whether or not this has been scheduled or done.  She said in Dr. Ruthe Mannan last note it states that he will need nerve study to evaluate tremor and tingling in his right hand.    I told her that I would have you review this and give her a call.  Jodi--[facility] [phone] and fax [phone]

## 2018-01-31 NOTE — Telephone Encounter (Signed)
Jared Griffin was working on the approval for the nerve study.   Message to her to see where this stands. I will call if needed.

## 2018-01-31 NOTE — Telephone Encounter (Signed)
I made a follow/up call to patient, and to adjuster Tally Due ph# [phone]. Both patient and adjuster aware [employer] approved, however, appt is pending location - [facility](3rd party administrator)has orders and notes, however, no appointment yet.  Patient states he spoke with a Librarian, academic at [place]; said approved for location [facility], near [facility], although next available appt is mid-July. Said [employer] can also get him scheduled at a location 100 miles away, which pt does not wish to do. Patient requests [facility], if this is an option. No further word as of yet.  We will follow up if no response by 02/07/18.

## 2018-02-03 NOTE — Telephone Encounter (Signed)
[facility] will not see Workers Comp, no exceptions.

## 2018-02-04 ENCOUNTER — Other Ambulatory Visit: Payer: Self-pay | Admitting: Family Medicine

## 2018-02-05 ENCOUNTER — Telehealth: Payer: Self-pay | Admitting: Cardiovascular Disease

## 2018-02-05 ENCOUNTER — Encounter: Payer: Self-pay | Admitting: *Deleted

## 2018-02-05 DIAGNOSIS — R079 Chest pain, unspecified: Secondary | ICD-10-CM

## 2018-02-05 NOTE — Telephone Encounter (Signed)
Patient notified and verbalized understanding.  He agrees to doing the stress test.  Order put in [place] to [facility] for scheduling.

## 2018-02-05 NOTE — Telephone Encounter (Signed)
Sob and some tightness in chest recently

## 2018-02-05 NOTE — Telephone Encounter (Signed)
Patient of Dr. Bronson Ing.  He is currently out of the office.  Message forwarded to my inbox for review.  Patient has a history of CAD with previous LAD stent, patent at angiography in 2017 with some residual LAD disease that was managed medically.  If his symptoms escalate, ER evaluation would be reasonable.  Otherwise, it would be worth arranging a Lexiscan Myoview prior to his pending office visit to reevaluate for ischemia.

## 2018-02-05 NOTE — Telephone Encounter (Signed)
Patient reports in the heat he has been having an increase in chest discomfort and shortness of breath. No current chest discomfort of shortness of breath currently. Patient reports it has not been severe enough to where he felt he needed a nitro. Patient denies any actual chest pain. Patient states this has been going on for the past 2 weeks. Advised patient that if he did develop chest pain he needs to be seen in the ER. Appointment scheduled to see strader on 6/12. Will forward to provider for any further recommendations.

## 2018-02-06 ENCOUNTER — Telehealth: Payer: Self-pay | Admitting: Cardiology

## 2018-02-06 NOTE — Telephone Encounter (Signed)
Pre-cert Verification for the following procedure   Lexiscan scheduled for 02/12/2018 at [facility]

## 2018-02-12 ENCOUNTER — Encounter (HOSPITAL_BASED_OUTPATIENT_CLINIC_OR_DEPARTMENT_OTHER)
Admission: RE | Admit: 2018-02-12 | Discharge: 2018-02-12 | Disposition: A | Discharge status: Home / Self Care | Payer: PRIVATE HEALTH INSURANCE | Source: Ambulatory Visit | Attending: Cardiology | Admitting: Cardiology

## 2018-02-12 ENCOUNTER — Encounter (HOSPITAL_COMMUNITY)
Admission: RE | Admit: 2018-02-12 | Discharge: 2018-02-12 | Disposition: A | Discharge status: Home / Self Care | Payer: PRIVATE HEALTH INSURANCE | Source: Ambulatory Visit | Attending: Cardiology | Admitting: Cardiology

## 2018-02-12 ENCOUNTER — Encounter (HOSPITAL_COMMUNITY): Payer: Self-pay

## 2018-02-12 DIAGNOSIS — R079 Chest pain, unspecified: Secondary | ICD-10-CM | POA: Diagnosis not present

## 2018-02-12 LAB — NM MYOCAR MULTI W/SPECT W/WALL MOTION / EF
LV dias vol: 121 mL (ref 62–150)
LV sys vol: 51 mL
Peak HR: 95 {beats}/min
RATE: 0.34
Rest HR: 71 {beats}/min
SDS: 0
SRS: 0
SSS: 0
TID: 1.11

## 2018-02-12 MED ORDER — SODIUM CHLORIDE 0.9% FLUSH
INTRAVENOUS | Status: AC
  Administered 2018-02-12: 10 mL via INTRAVENOUS
  Filled 2018-02-12: qty 10

## 2018-02-12 MED ORDER — TECHNETIUM TC 99M TETROFOSMIN IV KIT
30.0000 | PACK | Freq: Once | INTRAVENOUS | Status: AC | PRN
  Administered 2018-02-12: 32 via INTRAVENOUS

## 2018-02-12 MED ORDER — TECHNETIUM TC 99M TETROFOSMIN IV KIT
10.0000 | PACK | Freq: Once | INTRAVENOUS | Status: AC | PRN
Start: 2018-02-12 — End: 2018-02-12
  Administered 2018-02-12: 10.5 via INTRAVENOUS

## 2018-02-12 MED ORDER — REGADENOSON 0.4 MG/5ML IV SOLN
INTRAVENOUS | Status: AC
  Administered 2018-02-12: 0.4 mg via INTRAVENOUS
  Filled 2018-02-12: qty 5

## 2018-02-13 NOTE — Telephone Encounter (Signed)
Followed back up with patient - relayed as noted [facility] does not accept Workers comp under any circumstances.  Patient's adjuster had approved for any provider we can recommend that accepts Work comp.

## 2018-02-13 NOTE — Telephone Encounter (Signed)
Yes I would be okay seeing the patient or depending on timing I am not sure if [facility] could also do quicker.  I am not sure if they accept Worker's Compensation or not.  Dr. Narda Amber usually does a good job with nerve studies.

## 2018-02-13 NOTE — Telephone Encounter (Signed)
If patients adjustor gets approval for [place], will you perform the upper extremity nerve study to rule out radial nerve impingement?

## 2018-02-14 NOTE — Telephone Encounter (Signed)
02/14/18 I left a message with workers comp IT consultant at [employer] to re-advise regarding location for patient's study. Patient also aware of status.

## 2018-02-17 ENCOUNTER — Encounter

## 2018-02-17 ENCOUNTER — Ambulatory Visit (INDEPENDENT_AMBULATORY_CARE_PROVIDER_SITE_OTHER): Payer: PRIVATE HEALTH INSURANCE | Admitting: Cardiovascular Disease

## 2018-02-17 ENCOUNTER — Other Ambulatory Visit: Payer: Self-pay | Admitting: Cardiovascular Disease

## 2018-02-17 ENCOUNTER — Encounter: Payer: Self-pay | Admitting: Cardiovascular Disease

## 2018-02-17 ENCOUNTER — Telehealth: Payer: Self-pay | Admitting: Cardiovascular Disease

## 2018-02-17 VITALS — BP 112/70 | HR 94 | Ht 74.0 in | Wt 240.0 lb

## 2018-02-17 DIAGNOSIS — I209 Angina pectoris, unspecified: Secondary | ICD-10-CM | POA: Diagnosis not present

## 2018-02-17 DIAGNOSIS — I25118 Atherosclerotic heart disease of native coronary artery with other forms of angina pectoris: Secondary | ICD-10-CM

## 2018-02-17 DIAGNOSIS — E785 Hyperlipidemia, unspecified: Secondary | ICD-10-CM

## 2018-02-17 DIAGNOSIS — E781 Pure hyperglyceridemia: Secondary | ICD-10-CM

## 2018-02-17 DIAGNOSIS — E119 Type 2 diabetes mellitus without complications: Secondary | ICD-10-CM

## 2018-02-17 DIAGNOSIS — I10 Essential (primary) hypertension: Secondary | ICD-10-CM | POA: Diagnosis not present

## 2018-02-17 DIAGNOSIS — R9439 Abnormal result of other cardiovascular function study: Secondary | ICD-10-CM | POA: Diagnosis not present

## 2018-02-17 DIAGNOSIS — Z01812 Encounter for preprocedural laboratory examination: Secondary | ICD-10-CM

## 2018-02-17 LAB — HM DIABETES EYE EXAM

## 2018-02-17 MED ORDER — ICOSAPENT ETHYL 1 G PO CAPS: 2.0000 | ORAL_CAPSULE | Freq: Two times a day (BID) | ORAL | 3 refills | Status: DC

## 2018-02-17 NOTE — Patient Instructions (Addendum)
Medication Instructions:   Begin Vascepa 2gm twice a day.   Continue all other current medications.  Labwork: CBC, BMET - orders given today.    Testing/Procedures: Your physician has requested that you have a cardiac catheterization. Cardiac catheterization is used to diagnose and/or treat various heart conditions. Doctors may recommend this procedure for a number of different reasons. The most common reason is to evaluate chest pain. Chest pain can be a symptom of coronary artery disease (CAD), and cardiac catheterization can show whether plaque is narrowing or blocking your heart's arteries. This procedure is also used to evaluate the valves, as well as measure the blood flow and oxygen levels in different parts of your heart. For further information please visit [email or web address]. Please follow instruction sheet, as given.  Follow-Up: 6 weeks   Any Other Special Instructions Will Be Listed Below (If Applicable).  If you need a refill on your cardiac medications before your next appointment, please call your pharmacy.     [place] [address] Dept: [phone] Loc: [phone]  Cari Vandeberg  02/17/2018  You are scheduled for a Cardiac Catheterization on Wednesday, June 12 with Dr. Larae Grooms at 8:30 am.    1. Please arrive at the [facility] (Main Entrance A) at [facility]: [place], [place] [address] at 6:30 AM (two hours before your procedure to ensure your preparation). Free valet parking service is available.   Special note: Every effort is made to have your procedure done on time. Please understand that emergencies sometimes delay scheduled procedures.  2. Diet:  No solid food after midnight prior to procedure, clear liquids until 5 AM day of procedure.    3. Labs: Please do your labs on Wednesday or  Thursday of this week.    4. Medication instructions in preparation for your procedure:    *  Please hold your Glipizide and Metformin the morning of procedure.    *  You may resume the Glipizide the next day, but continue to hold the Metformin        another 48 hours post procedure.    On the morning of your procedure, take your Aspirin and any morning medicines NOT listed above.  You may use sips of water.  5. Plan for one night stay--bring personal belongings. 6. Bring a current list of your medications and current insurance cards. 7. You MUST have a responsible person to drive you home. 8. Someone MUST be with you the first 24 hours after you arrive home or your discharge will be delayed. 9. Please wear clothes that are easy to get on and off and wear slip-on shoes.  Thank you for allowing [place] to care for you!   -- [facility]

## 2018-02-17 NOTE — H&P (View-Only) (Signed)
SUBJECTIVE: The patient presents for follow-up of coronary disease with a history of LAD stent placement in March 2015.  He also has hypertension, type 2 diabetes mellitus, and hyperlipidemia.  Coronary angiography in 2016 demonstrated stent patency and a mid LAD stenosis of 40%.  He is also treated for anxiety and depression.  He had called our office complaining of some shortness of breath and chest tightness in May.  He underwent a nuclear stress test on 02/12/2018 which was overall a low risk study.  There was a small, mild intensity, mid to apical anteroseptal defect that is reversible and consistent with mild ischemia in the LAD distribution.  For the past several months he has been experiencing episodic chest pain, shortness of breath, and dizziness.  He fell about a month ago but did not lose consciousness.  He has been under a lot of stress as his wife recently developed pneumonia, pleurisy, and a GI infection and he is her primary caregiver.  A few weeks ago he tried doing some walking for exercise in the church parking lot.  It is 2 acres.  After walking about 50 yards he began expensing shooting pains in the left side of his chest and he had to stop.  He is thinking about filing for disability.  ECG performed today which I personally reviewed demonstrates sinus rhythm with no acute ST segment or T wave abnormalities.   Review of Systems: As per "subjective", otherwise negative.  Allergies  Allergen Reactions  . Metoprolol Rash    Current Outpatient Medications  Medication Sig Dispense Refill  . ACCU-CHEK AVIVA PLUS test strip CHECK BLOOD SUGAR UP TO FOUR TIMES A DAY. 100 each 5  . acetaminophen (TYLENOL) 500 MG tablet Take 1,000 mg by mouth daily as needed for headache. Pain.    Marland Kitchen ALPRAZolam (XANAX) 0.5 MG tablet Take 1 tablet (0.5 mg total) by mouth 3 (three) times daily. 90 tablet 2  . aspirin (ASPIR-LOW) 81 MG EC tablet Take 1 tablet (81 mg total) by mouth daily.  30 tablet 12  . atorvastatin (LIPITOR) 40 MG tablet TAKE ONE (1) TABLET BY MOUTH EVERY DAY 30 tablet 6  . diltiazem (CARDIZEM) 30 MG tablet Take 1 tablet (30 mg total) by mouth 2 (two) times daily. 180 tablet 1  . gabapentin (NEURONTIN) 300 MG capsule Take 600 mg by mouth 2 (two) times daily.     Marland Kitchen glipiZIDE (GLUCOTROL) 5 MG tablet Take 2 tablets (10 mg total) by mouth 2 (two) times daily. 360 tablet 1  . HYDROcodone-acetaminophen (NORCO) 5-325 MG tablet Take 1 tablet by mouth every 6 (six) hours as needed for moderate pain. 28 tablet 0  . INVOKANA 300 MG TABS tablet TAKE ONE TABLET BY MOUTH ONCE DAILY WITH BREAKFAST. 30 tablet 5  . losartan (COZAAR) 25 MG tablet TAKE ONE (1) TABLET BY MOUTH EVERY DAY 90 tablet 1  . metFORMIN (GLUCOPHAGE) 500 MG tablet TAKE TWO TABLET EVERY MORNING, THEN ONE TABLET AT NOON, THEN TWO TABLETS EVERY EVENING 450 tablet 1  . nitroGLYCERIN (NITROSTAT) 0.4 MG SL tablet Place 1 tablet (0.4 mg total) under the tongue every 5 (five) minutes as needed for chest pain. 25 tablet 3  . pantoprazole (PROTONIX) 40 MG tablet TAKE (1) TABLET BY MOUTH ONCE DAILY. 90 tablet 1  . ranolazine (RANEXA) 500 MG 12 hr tablet Take 1 tablet (500 mg total) by mouth 2 (two) times daily. 60 tablet 6  . sertraline (ZOLOFT) 100 MG tablet  Take 1 tablet (100 mg total) by mouth daily. 30 tablet 2   No current facility-administered medications for this visit.     Past Medical History:  Diagnosis Date  . Anxiety   . 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 Coronary atherosclerosis of native coronary artery    a. 12/09/2013: s/p PCI with 3.0 x 28 Promus DES to mLAD  . Cyst (solitary) of breast 10/03/2017   removed from back  . Depression   . Elbow injury 07/25/2017   surgery for torn tendon  . Essential hypertension, benign   . Fatty liver disease, nonalcoholic   . Lateral epicondylitis of right elbow   . Mixed hyperlipidemia 2006  . Obesity   . Osgood-Schlatter's disease of  right knee   . Type 2 diabetes mellitus ([place])     Past Surgical History:  Procedure Laterality Date  . CARDIAC CATHETERIZATION  2011  . CARDIAC CATHETERIZATION N/A 09/20/2015   Procedure: Left Heart Cath and Coronary Angiography;  Surgeon: [name], MD;  Location: [facility];  Service: Cardiovascular;  Laterality: N/A;  . CHOLECYSTECTOMY  2003  . COLONOSCOPY  06/19/2012   Procedure: COLONOSCOPY;  Surgeon: [name], MD;  [facility];  Service: Endoscopy;  Laterality: N/A;  930  . COLONOSCOPY N/A 10/17/2017   Procedure: COLONOSCOPY;  Surgeon: [name], MD;  [facility];  Service: Endoscopy;  Laterality: N/A;  1225  . KNEE ARTHROPLASTY Right 1999  . LEFT HEART CATHETERIZATION WITH CORONARY ANGIOGRAM N/A 12/09/2013   Procedure: LEFT HEART CATHETERIZATION WITH CORONARY ANGIOGRAM;  Surgeon: [name], MD;  Location: [facility] CATH LAB;  Service: Cardiovascular;  Laterality: N/A;  . Liver biopsy    . POLYPECTOMY  10/17/2017   Procedure: POLYPECTOMY;  Surgeon: [name], MD;  [facility];  Service: Endoscopy;;  ascending colon, splenic flexure,sigmoid x2  . TENNIS ELBOW RELEASE/NIRSCHEL PROCEDURE Right 07/25/2017   Procedure: TENNIS ELBOW RELEASE and debriedment;  Surgeon: [name], MD;  Location: [facility];  Service: Orthopedics;  Laterality: Right;  . TONSILLECTOMY  1970's    Social History   Socioeconomic History  . Marital status: Married    Spouse name: Not on file  . Number of children: Not on file  . Years of education: Not on file  . Highest education level: Not on file  Occupational History  . Not on file  Social Needs  . Financial resource strain: Not on file  . Food insecurity:    Worry: Not on file    Inability: Not on file  . Transportation needs:    Medical: Not on file    Non-medical: Not on file  Tobacco Use  . Smoking status: Former Smoker    Packs/day: 0.50    Years: 10.00     Pack years: 5.00    Types: Cigarettes    Start date: 02/02/1973    Last attempt to quit: 09/17/1983    Years since quitting: 34.4  . Smokeless tobacco: Former Systems developer    Types: [place] date: 09/17/1983  Substance and Sexual Activity  . Alcohol use: No    Alcohol/week: 0.0 oz    Comment: 11-30-15 per pt no  . Drug use: No    Comment: 11-30-15 per pt no  . Sexual activity: Yes  Lifestyle  . Physical activity:    Days per week: Not on file    Minutes per session: Not  on file  . Stress: Not on file  Relationships  . Social connections:    Talks on phone: Not on file    Gets together: Not on file    Attends religious service: Not on file    Active member of club or organization: Not on file    Attends meetings of clubs or organizations: Not on file    Relationship status: Not on file  . Intimate partner violence:    Fear of current or ex partner: Not on file    Emotionally abused: Not on file    Physically abused: Not on file    Forced sexual activity: Not on file  Other Topics Concern  . 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    Married with 1 child   Right handed    12 th    Very little caffeine     Vitals:   02/17/18 1537  BP: 112/70  Pulse: 94  SpO2: 96%  Weight: 240 lb (108.9 kg)  Height: 6' 2"  (1.88 m)    Wt Readings from Last 3 Encounters:  02/17/18 240 lb (108.9 kg)  12/18/17 241 lb (109.3 kg)  12/02/17 236 lb 0.8 oz (107.1 kg)     PHYSICAL EXAM General: NAD HEENT: Normal. Neck: No JVD, no thyromegaly. Lungs: Clear to auscultation bilaterally with normal respiratory effort. CV: Regular rate and rhythm, normal S1/S2, no S3/S4, no murmur. No pretibial or periankle edema.  No carotid bruit.   Abdomen: Soft, nontender, no distention.  Neurologic: Alert and oriented.  Psych: Normal affect. Skin: Normal. Musculoskeletal: No gross deformities.    ECG: Most  recent ECG reviewed.   Labs: Lab Results  Component Value Date/Time   K 3.4 (L) 07/23/2017 12:56 PM   BUN 19 07/23/2017 12:56 PM   BUN 13 04/13/2017 08:48 AM   CREATININE 0.95 07/23/2017 12:56 PM   CREATININE 0.96 12/07/2013 09:50 AM   ALT 26 04/13/2017 08:48 AM   TSH 0.458 04/13/2017 08:48 AM   HGB 16.1 07/23/2017 12:56 PM   HGB 15.3 04/13/2017 08:48 AM     Lipids: Lab Results  Component Value Date/Time   LDLCALC Comment 08/06/2017 08:40 AM   CHOL 171 08/06/2017 08:40 AM   TRIG 567 ([place]) 08/06/2017 08:40 AM   HDL 31 (L) 08/06/2017 08:40 AM       ASSESSMENT AND PLAN:   1. CAD and LAD stent in 11/2013 with shortness of breath and chest pains consistent with angina pectoris: Stent was patent by coronary angiography in 2016. Has 40% mid LAD stenosis. Continue aspirin, Lipitor, and Ranexa. I will arrange for left heart catheterization and coronary angiography. Risks and benefits of cardiac catheterization have been discussed with the patient.  These include bleeding, infection, kidney damage, stroke, heart attack, death.  The patient understands these risks and is willing to proceed.  2. Essential HTN: Blood pressure is normal. No changes to therapy.  3. Hyperlipidemia: Continue atorvastatin 40 mg.  Lipids reviewed from 08/06/2017.  Total cholesterol 171, triglycerides 567, HDL 31, LDL cannot be calculated. I will start Vascepa 2 gm bid.  4.  Type 2 diabetes mellitus: Currently treated with glipizide, Invokana, and metformin.  HbA1c 7.4% on 12/02/2017.   Disposition: Follow up 6 weeks after coronary angiography  Time spent: 40 minutes, of which greater than 50% was spent reviewing symptoms, relevant blood tests and studies, and discussing management plan with  the patient.    Kate Sable, M.D., F.A.C.C.

## 2018-02-17 NOTE — Progress Notes (Signed)
SUBJECTIVE: The patient presents for follow-up of coronary disease with a history of LAD stent placement in March 2015.  He also has hypertension, type 2 diabetes mellitus, and hyperlipidemia.  Coronary angiography in 2016 demonstrated stent patency and a mid LAD stenosis of 40%.  He is also treated for anxiety and depression.  He had called our office complaining of some shortness of breath and chest tightness in May.  He underwent a nuclear stress test on 02/12/2018 which was overall a low risk study.  There was a small, mild intensity, mid to apical anteroseptal defect that is reversible and consistent with mild ischemia in the LAD distribution.  For the past several months he has been experiencing episodic chest pain, shortness of breath, and dizziness.  He fell about a month ago but did not lose consciousness.  He has been under a lot of stress as his wife recently developed pneumonia, pleurisy, and a GI infection and he is her primary caregiver.  A few weeks ago he tried doing some walking for exercise in the church parking lot.  [place] 2 acres.  After walking about 50 yards he began expensing shooting pains in the left side of his chest and he had to stop.  He is thinking about filing for disability.  ECG performed today which I personally reviewed demonstrates sinus rhythm with no acute ST segment or T wave abnormalities.   Review of Systems: As per "subjective", otherwise negative.  Allergies  Allergen Reactions  . Metoprolol Rash    Current Outpatient Medications  Medication Sig Dispense Refill  . ACCU-CHEK AVIVA PLUS test strip CHECK BLOOD SUGAR UP TO FOUR TIMES A DAY. 100 each 5  . acetaminophen (TYLENOL) 500 MG tablet Take 1,000 mg by mouth daily as needed for headache. Pain.    Marland Kitchen ALPRAZolam (XANAX) 0.5 MG tablet Take 1 tablet (0.5 mg total) by mouth 3 (three) times daily. 90 tablet 2  . aspirin (ASPIR-LOW) 81 MG EC tablet Take 1 tablet (81 mg total) by mouth daily.  30 tablet 12  . atorvastatin (LIPITOR) 40 MG tablet TAKE ONE (1) TABLET BY MOUTH EVERY DAY 30 tablet 6  . diltiazem (CARDIZEM) 30 MG tablet Take 1 tablet (30 mg total) by mouth 2 (two) times daily. 180 tablet 1  . gabapentin (NEURONTIN) 300 MG capsule Take 600 mg by mouth 2 (two) times daily.     Marland Kitchen glipiZIDE (GLUCOTROL) 5 MG tablet Take 2 tablets (10 mg total) by mouth 2 (two) times daily. 360 tablet 1  . HYDROcodone-acetaminophen (NORCO) 5-325 MG tablet Take 1 tablet by mouth every 6 (six) hours as needed for moderate pain. 28 tablet 0  . INVOKANA 300 MG TABS tablet TAKE ONE TABLET BY MOUTH ONCE DAILY WITH BREAKFAST. 30 tablet 5  . losartan (COZAAR) 25 MG tablet TAKE ONE (1) TABLET BY MOUTH EVERY DAY 90 tablet 1  . metFORMIN (GLUCOPHAGE) 500 MG tablet TAKE TWO TABLET EVERY MORNING, THEN ONE TABLET AT NOON, THEN TWO TABLETS EVERY EVENING 450 tablet 1  . nitroGLYCERIN (NITROSTAT) 0.4 MG SL tablet Place 1 tablet (0.4 mg total) under the tongue every 5 (five) minutes as needed for chest pain. 25 tablet 3  . pantoprazole (PROTONIX) 40 MG tablet TAKE (1) TABLET BY MOUTH ONCE DAILY. 90 tablet 1  . ranolazine (RANEXA) 500 MG 12 hr tablet Take 1 tablet (500 mg total) by mouth 2 (two) times daily. 60 tablet 6  . sertraline (ZOLOFT) 100 MG tablet  Take 1 tablet (100 mg total) by mouth daily. 30 tablet 2   No current facility-administered medications for this visit.     Past Medical History:  Diagnosis Date  . Anxiety   . 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 Coronary atherosclerosis of native coronary artery    a. 12/09/2013: s/p PCI with 3.0 x 28 Promus DES to mLAD  . Cyst (solitary) of breast 10/03/2017   removed from back  . Depression   . Elbow injury 07/25/2017   surgery for torn tendon  . Essential hypertension, benign   . Fatty liver disease, nonalcoholic   . Lateral epicondylitis of right elbow   . Mixed hyperlipidemia 2006  . Obesity   . Osgood-Schlatter's disease of  right knee   . Type 2 diabetes mellitus ([place])     Past Surgical History:  Procedure Laterality Date  . CARDIAC CATHETERIZATION  2011  . CARDIAC CATHETERIZATION N/A 09/20/2015   Procedure: Left Heart Cath and Coronary Angiography;  Surgeon: [name], MD;  Location: [facility];  Service: Cardiovascular;  Laterality: N/A;  . CHOLECYSTECTOMY  2003  . COLONOSCOPY  06/19/2012   Procedure: COLONOSCOPY;  Surgeon: [name], MD;  [facility];  Service: Endoscopy;  Laterality: N/A;  930  . COLONOSCOPY N/A 10/17/2017   Procedure: COLONOSCOPY;  Surgeon: [name], MD;  [facility];  Service: Endoscopy;  Laterality: N/A;  1225  . KNEE ARTHROPLASTY Right 1999  . LEFT HEART CATHETERIZATION WITH CORONARY ANGIOGRAM N/A 12/09/2013   Procedure: LEFT HEART CATHETERIZATION WITH CORONARY ANGIOGRAM;  Surgeon: [name], MD;  Location: [facility] CATH LAB;  Service: Cardiovascular;  Laterality: N/A;  . Liver biopsy    . POLYPECTOMY  10/17/2017   Procedure: POLYPECTOMY;  Surgeon: [name], MD;  [facility];  Service: Endoscopy;;  ascending colon, splenic flexure,sigmoid x2  . TENNIS ELBOW RELEASE/NIRSCHEL PROCEDURE Right 07/25/2017   Procedure: TENNIS ELBOW RELEASE and debriedment;  Surgeon: [name], MD;  Location: [facility];  Service: Orthopedics;  Laterality: Right;  . TONSILLECTOMY  1970's    Social History   Socioeconomic History  . Marital status: Married    Spouse name: Not on file  . Number of children: Not on file  . Years of education: Not on file  . Highest education level: Not on file  Occupational History  . Not on file  Social Needs  . Financial resource strain: Not on file  . Food insecurity:    Worry: Not on file    Inability: Not on file  . Transportation needs:    Medical: Not on file    Non-medical: Not on file  Tobacco Use  . Smoking status: Former Smoker    Packs/day: 0.50    Years: 10.00     Pack years: 5.00    Types: Cigarettes    Start date: 02/02/1973    Last attempt to quit: 09/17/1983    Years since quitting: 34.4  . Smokeless tobacco: Former Systems developer    Types: [place] date: 09/17/1983  Substance and Sexual Activity  . Alcohol use: No    Alcohol/week: 0.0 oz    Comment: 11-30-15 per pt no  . Drug use: No    Comment: 11-30-15 per pt no  . Sexual activity: Yes  Lifestyle  . Physical activity:    Days per week: Not on file    Minutes per session: Not  on file  . Stress: Not on file  Relationships  . Social connections:    Talks on phone: Not on file    Gets together: Not on file    Attends religious service: Not on file    Active member of club or organization: Not on file    Attends meetings of clubs or organizations: Not on file    Relationship status: Not on file  . Intimate partner violence:    Fear of current or ex partner: Not on file    Emotionally abused: Not on file    Physically abused: Not on file    Forced sexual activity: Not on file  Other Topics Concern  . 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    Married with 1 child   Right handed    12 th    Very little caffeine     Vitals:   02/17/18 1537  BP: 112/70  Pulse: 94  SpO2: 96%  Weight: 240 lb (108.9 kg)  Height: 6' 2"  (1.88 m)    Wt Readings from Last 3 Encounters:  02/17/18 240 lb (108.9 kg)  12/18/17 241 lb (109.3 kg)  12/02/17 236 lb 0.8 oz (107.1 kg)     PHYSICAL EXAM General: NAD HEENT: Normal. Neck: No JVD, no thyromegaly. Lungs: Clear to auscultation bilaterally with normal respiratory effort. CV: Regular rate and rhythm, normal S1/S2, no S3/S4, no murmur. No pretibial or periankle edema.  No carotid bruit.   Abdomen: Soft, nontender, no distention.  Neurologic: Alert and oriented.  Psych: Normal affect. Skin: Normal. Musculoskeletal: No gross deformities.    ECG: Most  recent ECG reviewed.   Labs: Lab Results  Component Value Date/Time   K 3.4 (L) 07/23/2017 12:56 PM   BUN 19 07/23/2017 12:56 PM   BUN 13 04/13/2017 08:48 AM   CREATININE 0.95 07/23/2017 12:56 PM   CREATININE 0.96 12/07/2013 09:50 AM   ALT 26 04/13/2017 08:48 AM   TSH 0.458 04/13/2017 08:48 AM   HGB 16.1 07/23/2017 12:56 PM   HGB 15.3 04/13/2017 08:48 AM     Lipids: Lab Results  Component Value Date/Time   LDLCALC Comment 08/06/2017 08:40 AM   CHOL 171 08/06/2017 08:40 AM   TRIG 567 ([place]) 08/06/2017 08:40 AM   HDL 31 (L) 08/06/2017 08:40 AM       ASSESSMENT AND PLAN:   1. CAD and LAD stent in 11/2013 with shortness of breath and chest pains consistent with angina pectoris: Stent was patent by coronary angiography in 2016. Has 40% mid LAD stenosis. Continue aspirin, Lipitor, and Ranexa. I will arrange for left heart catheterization and coronary angiography. Risks and benefits of cardiac catheterization have been discussed with the patient.  These include bleeding, infection, kidney damage, stroke, heart attack, death.  The patient understands these risks and is willing to proceed.  2. Essential HTN: Blood pressure is normal. No changes to therapy.  3. Hyperlipidemia: Continue atorvastatin 40 mg.  Lipids reviewed from 08/06/2017.  Total cholesterol 171, triglycerides 567, HDL 31, LDL cannot be calculated. I will start Vascepa 2 gm bid.  4.  Type 2 diabetes mellitus: Currently treated with glipizide, Invokana, and metformin.  HbA1c 7.4% on 12/02/2017.   Disposition: Follow up 6 weeks after coronary angiography  Time spent: 40 minutes, of which greater than 50% was spent reviewing symptoms, relevant blood tests and studies, and discussing management plan with  the patient.    Kate Sable, M.D., F.A.C.C.

## 2018-02-17 NOTE — Telephone Encounter (Signed)
Left heart cath - [facility] - 6/12 - 8:30 am

## 2018-02-18 NOTE — Telephone Encounter (Signed)
Left a follow up message for adjuster Guadlupe Spanish, ph [phone] regarding status of approval for the 2 most recent options noted for nerve conduction study.

## 2018-02-19 ENCOUNTER — Telehealth: Payer: Self-pay | Admitting: *Deleted

## 2018-02-19 LAB — CBC
Hematocrit: 45.3 % (ref 37.5–51.0)
Hemoglobin: 15.9 g/dL (ref 13.0–17.7)
MCH: 29.7 pg (ref 26.6–33.0)
MCHC: 35.1 g/dL (ref 31.5–35.7)
MCV: 85 fL (ref 79–97)
Platelets: 165 10*3/uL (ref 150–450)
RBC: 5.35 x10E6/uL (ref 4.14–5.80)
RDW: 14.2 % (ref 12.3–15.4)
WBC: 6.1 10*3/uL (ref 3.4–10.8)

## 2018-02-19 LAB — BASIC METABOLIC PANEL
BUN/Creatinine Ratio: 17 (ref 9–20)
BUN: 14 mg/dL (ref 6–24)
CO2: 23 mmol/L (ref 20–29)
Calcium: 10 mg/dL (ref 8.7–10.2)
Chloride: 98 mmol/L (ref 96–106)
Creatinine, Ser: 0.83 mg/dL (ref 0.76–1.27)
GFR calc Af Amer: 114 mL/min/{1.73_m2} (ref >59)
GFR calc non Af Amer: 98 mL/min/{1.73_m2} (ref >59)
Glucose: 248 mg/dL — ABNORMAL HIGH (ref 65–99)
Potassium: 4.3 mmol/L (ref 3.5–5.2)
Sodium: 138 mmol/L (ref 134–144)

## 2018-02-19 NOTE — Telephone Encounter (Signed)
This patients adjustor has approved for study at [place] the order was placed on 12/18/17 Dr Ernestina Patches has indicated he will perform study  Will you guys call to schedule him please?

## 2018-02-19 NOTE — Telephone Encounter (Signed)
Notes recorded by Laurine Blazer, LPN on 10/22/4999 at 6:42 PM EDT Patient notified. Copy to pmd. Follow up scheduled for 04/04/2018 with Dr. Bronson Ing.   ------  Notes recorded by Herminio Commons, MD on 02/19/2018 at 12:46 PM EDT Elevated blood sugar. All other labs are normal.

## 2018-02-19 NOTE — Telephone Encounter (Signed)
Received return call from [place], Su Hoff, (updated) ph [phone], fax [phone], again approved nerve conduction study with (either) Dr Magnus Sinning, [place], or with Dr Narda Amber, [facility], ph [phone], whichever is first available. Patient aware of status.

## 2018-02-19 NOTE — Telephone Encounter (Signed)
Pt is scheduled for 6/27 for nerve study.

## 2018-02-20 ENCOUNTER — Ambulatory Visit (INDEPENDENT_AMBULATORY_CARE_PROVIDER_SITE_OTHER): Payer: PRIVATE HEALTH INSURANCE | Admitting: Psychiatry

## 2018-02-20 ENCOUNTER — Encounter (HOSPITAL_COMMUNITY): Payer: Self-pay | Admitting: Psychiatry

## 2018-02-20 VITALS — BP 129/86 | HR 87 | Ht 74.0 in | Wt 235.0 lb

## 2018-02-20 DIAGNOSIS — Z566 Other physical and mental strain related to work: Secondary | ICD-10-CM

## 2018-02-20 DIAGNOSIS — F321 Major depressive disorder, single episode, moderate: Secondary | ICD-10-CM | POA: Diagnosis not present

## 2018-02-20 DIAGNOSIS — Z79899 Other long term (current) drug therapy: Secondary | ICD-10-CM

## 2018-02-20 DIAGNOSIS — Z87891 Personal history of nicotine dependence: Secondary | ICD-10-CM

## 2018-02-20 MED ORDER — ALPRAZOLAM 0.5 MG PO TABS: 0.5000 mg | ORAL_TABLET | Freq: Three times a day (TID) | ORAL | 2 refills | Status: DC

## 2018-02-20 MED ORDER — SERTRALINE HCL 100 MG PO TABS: 100.0000 mg | ORAL_TABLET | Freq: Every day | ORAL | 2 refills | Status: DC

## 2018-02-20 NOTE — Progress Notes (Signed)
BH MD/PA/NP OP Progress Note  02/20/2018 11:32 AM Jared Griffin  MRN:  [id number]  Chief Complaint:  Chief Complaint    Depression; Anxiety; Follow-up     HPI: This patient is a 57 year old married white male lives with his wife and 33 year old son in [place]. 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  Recently he has been working at [employer] zone  The patient was referred by his primary physician, Dr. [name], for further assessment and treatment of anxiety and depression.  The patient states that he is become increasingly depressed and anxious over the last couple of years due to work stress. He stated that he was always under pressure to get more things done than he had time to do. 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 He got to the point of hating going to work. Several years ago he had cardiac problems and had a stent placed. In January of this year he began developing dizzy spells chest pain headaches stomach aches. It got so bad that he was readmitted for cardiac catheterization but nothing new was found.  The patient was having difficulty sleeping constant worry and anxiety about his job sadness and anger. He was getting irritable with people at home. After talking to his primary care physician at length and decided to take him out on medical retirement. The patient has been on Wellbutrin SR 150 no gram twice a day for several weeks now. He had also been out in the past when his wife was sick. He has not seen any benefit from this but does feel tremendously better since he stopped working. He is also on Xanax 0.5 mg 3 times a day which he thinks has helped to some degree.  Since stopping working he's had a big turnaround. His sleep and energy have improved. 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 He is much less anxious and is no longer having the chest pain dizzy spells or stomachaches. He denies suicidal ideation or psychotic symptoms. He does not use drugs or alcohol  The patient returns after 3 months for treatment of depression and anxiety.  He is been having a lot more chest pain recently and had a myocardial perfusion study.  He is scheduled to have another cardiac catheterization next week.  He seems fairly positive about this.  He has been under a lot more stress because his wife has been sick with pneumonia a GI infection and generalized inability to function.  He has been taking care of both the inside and outside of the house.  For the most part however his mood is good and he is sleeping well his energy is fairly good except when he overdoes things and has chest pain.  He denies any thoughts of suicide. Visit Diagnosis:    ICD-10-CM   1. Moderate single current episode of major depressive disorder ([place]) F32.1     Past Psychiatric History: none  Past Medical History:  Past Medical History:  Diagnosis Date  . Anxiety   . 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 Coronary atherosclerosis of native coronary artery    a. 12/09/2013: s/p PCI with 3.0 x 28 Promus DES to mLAD  . Cyst (solitary) of breast 10/03/2017   removed from back  .  Depression   . Elbow injury 07/25/2017   surgery for torn tendon  . Essential hypertension, benign   . Fatty liver disease, nonalcoholic   . Lateral epicondylitis of right elbow   . Mixed hyperlipidemia 2006  . Obesity   . Osgood-Schlatter's disease of right knee   . Type 2 diabetes mellitus ([place])     Past Surgical History:  Procedure Laterality Date  . CARDIAC CATHETERIZATION  2011  . CARDIAC CATHETERIZATION N/A 09/20/2015   Procedure: Left Heart Cath and Coronary Angiography;  Surgeon: [name], MD;  Location: [facility];  Service: Cardiovascular;  Laterality: N/A;  . CHOLECYSTECTOMY  2003  .  COLONOSCOPY  06/19/2012   Procedure: COLONOSCOPY;  Surgeon: [name], MD;  Location: [facility];  Service: Endoscopy;  Laterality: N/A;  930  . COLONOSCOPY N/A 10/17/2017   Procedure: COLONOSCOPY;  Surgeon: [name], MD;  Location: [facility];  Service: Endoscopy;  Laterality: N/A;  1225  . KNEE ARTHROPLASTY Right 1999  . LEFT HEART CATHETERIZATION WITH CORONARY ANGIOGRAM N/A 12/09/2013   Procedure: LEFT HEART CATHETERIZATION WITH CORONARY ANGIOGRAM;  Surgeon: [name], MD;  Location[facility] CATH LAB;  Service: Cardiovascular;  Laterality: N/A;  . Liver biopsy    . POLYPECTOMY  10/17/2017   Procedure: POLYPECTOMY;  Surgeon: [name], MD;  Location: [facility];  Service: Endoscopy;;  ascending colon, splenic flexure,sigmoid x2  . TENNIS ELBOW RELEASE/NIRSCHEL PROCEDURE Right 07/25/2017   Procedure: TENNIS ELBOW RELEASE and debriedment;  Surgeon: [name], MD;  Location: [facility];  Service: Orthopedics;  Laterality: Right;  . TONSILLECTOMY  1970's    Family Psychiatric History: none  Family History:  Family History  Problem Relation Age of Onset  . Cancer Maternal Aunt   . CVA Maternal Grandmother   . CVA Maternal Grandfather     Social History:  Social History   Socioeconomic History  . Marital status: Married    Spouse name: Not on file  . Number of children: Not on file  . Years of education: Not on file  . Highest education level: Not on file  Occupational History  . Not on file  Social Needs  . Financial resource strain: Not on file  . Food insecurity:    Worry: Not on file    Inability: Not on file  . Transportation needs:    Medical: Not on file    Non-medical: Not on file  Tobacco Use  . Smoking status: Former Smoker    Packs/day: 0.50    Years: 10.00    Pack years: 5.00    Types: Cigarettes    Start date: 02/02/1973    Last attempt to quit: 09/17/1983    Years since quitting: 34.4  . Smokeless tobacco: Former Systems developer     Types: [place] date: 09/17/1983  Substance and Sexual Activity  . Alcohol use: No    Alcohol/week: 0.0 oz    Comment: 11-30-15 per pt no  . Drug use: No    Comment: 11-30-15 per pt no  . Sexual activity: Yes  Lifestyle  . Physical activity:    Days per week: Not on file    Minutes per session: Not on file  . Stress: Not on file  Relationships  . Social connections:    Talks on phone: Not on file    Gets together: Not on file    Attends religious service: Not on file  Active member of club or organization: Not on file    Attends meetings of clubs or organizations: Not on file    Relationship status: Not on file  Other Topics Concern  . 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    Married with 1 child   Right handed    12 th    Very little caffeine    Allergies:  Allergies  Allergen Reactions  . Metoprolol Rash    Metabolic Disorder Labs: Lab Results  Component Value Date   HGBA1C 7.4 12/02/2017   MPG 197.25 07/23/2017   No results found for: PROLACTIN Lab Results  Component Value Date   CHOL 171 08/06/2017   TRIG 567 (HH) 08/06/2017   HDL 31 (L) 08/06/2017   CHOLHDL 5.5 (H) 08/06/2017   VLDL 27.0 02/22/2014   [place] Comment 08/06/2017   LDLCALC 70 11/24/2016   Lab Results  Component Value Date   TSH 0.458 04/13/2017   TSH 0.493 02/05/2014    Therapeutic Level Labs: No results found for: LITHIUM No results found for: VALPROATE No components found for:  CBMZ  Current Medications: Current Outpatient Medications  Medication Sig Dispense Refill  . ACCU-CHEK AVIVA PLUS test strip CHECK BLOOD SUGAR UP TO FOUR TIMES A DAY. 100 each 5  . acetaminophen (TYLENOL) 500 MG tablet Take 1,000 mg by mouth daily as needed for moderate pain or headache.     . ALPRAZolam (XANAX) 0.5 MG tablet Take 1 tablet (0.5 mg total) by mouth 3 (three) times daily. 90 tablet 2  .  aspirin (ASPIR-LOW) 81 MG EC tablet Take 1 tablet (81 mg total) by mouth daily. 30 tablet 12  . atorvastatin (LIPITOR) 40 MG tablet TAKE ONE (1) TABLET BY MOUTH EVERY DAY 30 tablet 6  . diltiazem (CARDIZEM) 30 MG tablet Take 1 tablet (30 mg total) by mouth 2 (two) times daily. 180 tablet 1  . gabapentin (NEURONTIN) 300 MG capsule Take 600 mg by mouth 2 (two) times daily.     Marland Kitchen glipiZIDE (GLUCOTROL) 5 MG tablet Take 2 tablets (10 mg total) by mouth 2 (two) times daily. 360 tablet 1  . HYDROcodone-acetaminophen (NORCO) 5-325 MG tablet Take 1 tablet by mouth every 6 (six) hours as needed for moderate pain. 28 tablet 0  . Icosapent Ethyl (VASCEPA) 1 g CAPS Take 2 capsules (2 g total) by mouth 2 (two) times daily. 360 capsule 3  . INVOKANA 300 MG TABS tablet TAKE ONE TABLET BY MOUTH ONCE DAILY WITH BREAKFAST. 30 tablet 5  . losartan (COZAAR) 25 MG tablet TAKE ONE (1) TABLET BY MOUTH EVERY DAY 90 tablet 1  . metFORMIN (GLUCOPHAGE) 500 MG tablet TAKE TWO TABLET EVERY MORNING, THEN ONE TABLET AT NOON, THEN TWO TABLETS EVERY EVENING (Patient taking differently: Take 500-1,000 mg by mouth See admin instructions. TAKE 1000 MG BY MOUTH EVERY MORNING, THEN 500 MG BY MOUTH AT NOON, THEN 1000 MG BY MOUTH EVERY EVENING) 450 tablet 1  . nitroGLYCERIN (NITROSTAT) 0.4 MG SL tablet Place 1 tablet (0.4 mg total) under the tongue every 5 (five) minutes as needed for chest pain. 25 tablet 3  . pantoprazole (PROTONIX) 40 MG tablet TAKE (1) TABLET BY MOUTH ONCE DAILY. 90 tablet 1  . ranolazine (RANEXA) 500 MG 12 hr tablet Take 1 tablet (500 mg total) by mouth 2 (two) times daily. 60 tablet 6  . sertraline (ZOLOFT) 100 MG  tablet Take 1 tablet (100 mg total) by mouth daily. 90 tablet 2   No current facility-administered medications for this visit.      Musculoskeletal: Strength & Muscle Tone: within normal limits Gait & Station: normal Patient leans: N/A  Psychiatric Specialty Exam: Review of Systems  Cardiovascular:  Positive for chest pain.  Musculoskeletal: Positive for joint pain.  Neurological: Positive for tingling.  All other systems reviewed and are negative.   Blood pressure 129/86, pulse 87, height 6' 2"  (1.88 m), weight 235 lb (106.6 kg), SpO2 96 %.Body mass index is 30.17 kg/m.  General Appearance: Casual, Neat and Well Groomed  Eye Contact:  Good  Speech:  Clear and Coherent  Volume:  Normal  Mood:  Euthymic  Affect:  Congruent  Thought Process:  Goal Directed  Orientation:  Full (Time, Place, and Person)  Thought Content: Rumination   Suicidal Thoughts:  No  Homicidal Thoughts:  No  Memory:  Immediate;   Good Recent;   Good Remote;   Good  Judgement:  Good  Insight:  Good  Psychomotor Activity:  Decreased  Concentration:  Concentration: Good and Attention Span: Good  Recall:  Good  Fund of Knowledge: Good  Language: Good  Akathisia:  No  Handed:  Right  AIMS (if indicated): not done  Assets:  Communication Skills Desire for Improvement Resilience Social Support Talents/Skills  ADL's:  Intact  Cognition: WNL  Sleep:  Good   Screenings: PHQ2-9     Office Visit from 12/02/2017 in [place] Visit from 08/02/2017 in [place]  PHQ-2 Total Score  0  0  PHQ-9 Total Score  1  -       Assessment and Plan: This patient is a 57 year old male with a history of depression and anxiety.  He is doing well on his current regimen despite his recent medical issues and his wife's decline.  He will continue Zoloft 100 mg daily for depression and Xanax 0.5 mg 3 times daily for anxiety.  He will return to see me in 3 months   [name], MD 02/20/2018, 11:32 AM

## 2018-02-25 ENCOUNTER — Telehealth: Payer: Self-pay | Admitting: *Deleted

## 2018-02-25 NOTE — Telephone Encounter (Signed)
Pt contacted pre-catheterization scheduled at [facility] for: Wednesday June 12,2019 8:30 AM Verified arrival time and place: [facility] Main Entrance A at:6:30 AM  No solid food after midnight prior to cath, clear liquids until 5 AM day of procedure. Verified allergies in Epic  Hold: Metformin 02/26/18 and 48 hours post procedure. Glipizide AM of procedure. Invokana AM of procedure.  Except hold medications AM meds can be  taken pre-cath with sip of water including: ASA 81 mg  Confirmed patient has responsible person to drive home post procedure and observe patient for 24 hours: yes

## 2018-02-26 ENCOUNTER — Encounter (HOSPITAL_COMMUNITY): Payer: Self-pay | Admitting: Interventional Cardiology

## 2018-02-26 ENCOUNTER — Ambulatory Visit (HOSPITAL_COMMUNITY)
Admission: RE | Disposition: A | Discharge status: Home / Self Care | Payer: Self-pay | Source: Ambulatory Visit | Attending: Interventional Cardiology

## 2018-02-26 ENCOUNTER — Ambulatory Visit: Payer: Self-pay | Admitting: Student

## 2018-02-26 ENCOUNTER — Ambulatory Visit (HOSPITAL_COMMUNITY)
Admission: RE | Admit: 2018-02-26 | Discharge: 2018-02-26 | Disposition: A | Discharge status: Home / Self Care | Payer: PRIVATE HEALTH INSURANCE | Source: Ambulatory Visit | Attending: Interventional Cardiology | Admitting: Interventional Cardiology

## 2018-02-26 DIAGNOSIS — Z8619 Personal history of other infectious and parasitic diseases: Secondary | ICD-10-CM | POA: Insufficient documentation

## 2018-02-26 DIAGNOSIS — Z7984 Long term (current) use of oral hypoglycemic drugs: Secondary | ICD-10-CM | POA: Diagnosis not present

## 2018-02-26 DIAGNOSIS — F329 Major depressive disorder, single episode, unspecified: Secondary | ICD-10-CM | POA: Insufficient documentation

## 2018-02-26 DIAGNOSIS — Z9889 Other specified postprocedural states: Secondary | ICD-10-CM | POA: Insufficient documentation

## 2018-02-26 DIAGNOSIS — Z87891 Personal history of nicotine dependence: Secondary | ICD-10-CM | POA: Insufficient documentation

## 2018-02-26 DIAGNOSIS — Z6830 Body mass index (BMI) 30.0-30.9, adult: Secondary | ICD-10-CM | POA: Diagnosis not present

## 2018-02-26 DIAGNOSIS — Z7982 Long term (current) use of aspirin: Secondary | ICD-10-CM | POA: Diagnosis not present

## 2018-02-26 DIAGNOSIS — R9439 Abnormal result of other cardiovascular function study: Secondary | ICD-10-CM | POA: Insufficient documentation

## 2018-02-26 DIAGNOSIS — M7711 Lateral epicondylitis, right elbow: Secondary | ICD-10-CM | POA: Insufficient documentation

## 2018-02-26 DIAGNOSIS — I219 Acute myocardial infarction, unspecified: Secondary | ICD-10-CM | POA: Insufficient documentation

## 2018-02-26 DIAGNOSIS — I25119 Atherosclerotic heart disease of native coronary artery with unspecified angina pectoris: Secondary | ICD-10-CM | POA: Diagnosis not present

## 2018-02-26 DIAGNOSIS — Z955 Presence of coronary angioplasty implant and graft: Secondary | ICD-10-CM | POA: Insufficient documentation

## 2018-02-26 DIAGNOSIS — K760 Fatty (change of) liver, not elsewhere classified: Secondary | ICD-10-CM | POA: Insufficient documentation

## 2018-02-26 DIAGNOSIS — I209 Angina pectoris, unspecified: Secondary | ICD-10-CM

## 2018-02-26 DIAGNOSIS — E669 Obesity, unspecified: Secondary | ICD-10-CM | POA: Insufficient documentation

## 2018-02-26 DIAGNOSIS — E119 Type 2 diabetes mellitus without complications: Secondary | ICD-10-CM | POA: Insufficient documentation

## 2018-02-26 DIAGNOSIS — K7460 Unspecified cirrhosis of liver: Secondary | ICD-10-CM | POA: Diagnosis not present

## 2018-02-26 DIAGNOSIS — I10 Essential (primary) hypertension: Secondary | ICD-10-CM | POA: Diagnosis not present

## 2018-02-26 DIAGNOSIS — Z9049 Acquired absence of other specified parts of digestive tract: Secondary | ICD-10-CM | POA: Insufficient documentation

## 2018-02-26 DIAGNOSIS — Z79899 Other long term (current) drug therapy: Secondary | ICD-10-CM | POA: Diagnosis not present

## 2018-02-26 DIAGNOSIS — Z888 Allergy status to other drugs, medicaments and biological substances status: Secondary | ICD-10-CM | POA: Insufficient documentation

## 2018-02-26 DIAGNOSIS — F419 Anxiety disorder, unspecified: Secondary | ICD-10-CM | POA: Insufficient documentation

## 2018-02-26 DIAGNOSIS — E782 Mixed hyperlipidemia: Secondary | ICD-10-CM | POA: Diagnosis not present

## 2018-02-26 HISTORY — PX: LEFT HEART CATH AND CORONARY ANGIOGRAPHY: CATH118249

## 2018-02-26 LAB — GLUCOSE, CAPILLARY
Glucose-Capillary: 299 mg/dL — ABNORMAL HIGH (ref 65–99)
Glucose-Capillary: 228 mg/dL — ABNORMAL HIGH (ref 65–99)

## 2018-02-26 SURGERY — LEFT HEART CATH AND CORONARY ANGIOGRAPHY
Anesthesia: LOCAL

## 2018-02-26 MED ORDER — INSULIN ASPART 100 UNIT/ML ~~LOC~~ SOLN
SUBCUTANEOUS | Status: AC
  Filled 2018-02-26: qty 1

## 2018-02-26 MED ORDER — VERAPAMIL HCL 2.5 MG/ML IV SOLN
INTRAVENOUS | Status: AC
  Filled 2018-02-26: qty 2

## 2018-02-26 MED ORDER — VERAPAMIL HCL 2.5 MG/ML IV SOLN
INTRAVENOUS | Status: DC | PRN
  Administered 2018-02-26: 10 mL via INTRA_ARTERIAL

## 2018-02-26 MED ORDER — INSULIN ASPART 100 UNIT/ML ~~LOC~~ SOLN
5.0000 [IU] | Freq: Once | SUBCUTANEOUS | Status: AC
  Administered 2018-02-26: 5 [IU] via SUBCUTANEOUS
  Filled 2018-02-26: qty 0.05

## 2018-02-26 MED ORDER — SODIUM CHLORIDE 0.9% FLUSH: 3.0000 mL | Freq: Two times a day (BID) | INTRAVENOUS | Status: DC

## 2018-02-26 MED ORDER — LIDOCAINE HCL (PF) 1 % IJ SOLN
INTRAMUSCULAR | Status: DC | PRN
  Administered 2018-02-26: 2 mL

## 2018-02-26 MED ORDER — SODIUM CHLORIDE 0.9 % IV SOLN: INTRAVENOUS | Status: AC

## 2018-02-26 MED ORDER — FENTANYL CITRATE (PF) 100 MCG/2ML IJ SOLN
INTRAMUSCULAR | Status: DC | PRN
  Administered 2018-02-26: 25 ug via INTRAVENOUS

## 2018-02-26 MED ORDER — SODIUM CHLORIDE 0.9% FLUSH: 3.0000 mL | INTRAVENOUS | Status: DC | PRN

## 2018-02-26 MED ORDER — FENTANYL CITRATE (PF) 100 MCG/2ML IJ SOLN
INTRAMUSCULAR | Status: AC
  Filled 2018-02-26: qty 2

## 2018-02-26 MED ORDER — SODIUM CHLORIDE 0.9 % IV SOLN: 250.0000 mL | INTRAVENOUS | Status: DC | PRN

## 2018-02-26 MED ORDER — ACETAMINOPHEN 325 MG PO TABS: 650.0000 mg | ORAL_TABLET | ORAL | Status: DC | PRN

## 2018-02-26 MED ORDER — SODIUM CHLORIDE 0.9 % WEIGHT BASED INFUSION: 1.0000 mL/kg/h | INTRAVENOUS | Status: DC

## 2018-02-26 MED ORDER — ASPIRIN 81 MG PO CHEW
81.0000 mg | CHEWABLE_TABLET | ORAL | Status: AC
  Administered 2018-02-26: 81 mg via ORAL

## 2018-02-26 MED ORDER — ASPIRIN 81 MG PO CHEW
CHEWABLE_TABLET | ORAL | Status: AC
  Filled 2018-02-26: qty 1

## 2018-02-26 MED ORDER — HEPARIN (PORCINE) IN NACL 2-0.9 UNITS/ML
INTRAMUSCULAR | Status: AC | PRN
  Administered 2018-02-26 (×2): 500 mL

## 2018-02-26 MED ORDER — IOHEXOL 350 MG/ML SOLN
INTRAVENOUS | Status: DC | PRN
  Administered 2018-02-26: 45 mL

## 2018-02-26 MED ORDER — HEPARIN SODIUM (PORCINE) 1000 UNIT/ML IJ SOLN
INTRAMUSCULAR | Status: AC
  Filled 2018-02-26: qty 1

## 2018-02-26 MED ORDER — HEPARIN (PORCINE) IN NACL 1000-0.9 UT/500ML-% IV SOLN
INTRAVENOUS | Status: AC
  Filled 2018-02-26: qty 1000

## 2018-02-26 MED ORDER — METFORMIN HCL 500 MG PO TABS: 500.0000 mg | ORAL_TABLET | ORAL | Status: DC

## 2018-02-26 MED ORDER — MIDAZOLAM HCL 2 MG/2ML IJ SOLN
INTRAMUSCULAR | Status: AC
  Filled 2018-02-26: qty 2

## 2018-02-26 MED ORDER — HEPARIN SODIUM (PORCINE) 1000 UNIT/ML IJ SOLN
INTRAMUSCULAR | Status: DC | PRN
  Administered 2018-02-26: 5000 [IU] via INTRAVENOUS

## 2018-02-26 MED ORDER — SODIUM CHLORIDE 0.9 % WEIGHT BASED INFUSION
3.0000 mL/kg/h | INTRAVENOUS | Status: DC
  Administered 2018-02-26: 3 mL/kg/h via INTRAVENOUS

## 2018-02-26 MED ORDER — LIDOCAINE HCL (PF) 1 % IJ SOLN
INTRAMUSCULAR | Status: AC
  Filled 2018-02-26: qty 30

## 2018-02-26 MED ORDER — ONDANSETRON HCL 4 MG/2ML IJ SOLN
4.0000 mg | Freq: Four times a day (QID) | INTRAMUSCULAR | Status: DC | PRN
Start: 2018-02-26 — End: 2018-02-26

## 2018-02-26 MED ORDER — MIDAZOLAM HCL 2 MG/2ML IJ SOLN
INTRAMUSCULAR | Status: DC | PRN
  Administered 2018-02-26: 2 mg via INTRAVENOUS

## 2018-02-26 SURGICAL SUPPLY — 11 items
CATH INFINITI 5FR MULTPACK ANG (CATHETERS) ×1 IMPLANT
DEVICE RAD COMP TR BAND LRG (VASCULAR PRODUCTS) ×1 IMPLANT
GUIDEWIRE INQWIRE 1.5J.035X260 (WIRE) IMPLANT
INQWIRE 1.5J .035X260CM (WIRE) ×2
KIT HEART LEFT (KITS) ×2 IMPLANT
NDL PERC 21GX4CM (NEEDLE) IMPLANT
NEEDLE PERC 21GX4CM (NEEDLE) ×2 IMPLANT
PACK CARDIAC CATHETERIZATION (CUSTOM PROCEDURE TRAY) ×2 IMPLANT
SHEATH RAIN RADIAL 21G 6FR (SHEATH) ×1 IMPLANT
TRANSDUCER W/STOPCOCK (MISCELLANEOUS) ×2 IMPLANT
TUBING CIL FLEX 10 FLL-RA (TUBING) ×2 IMPLANT

## 2018-02-26 NOTE — Interval H&P Note (Signed)
Cath Lab Visit (complete for each Cath Lab visit)  Clinical Evaluation Leading to the Procedure:   ACS: No.  Non-ACS:    Anginal Classification: CCS III  Anti-ischemic medical therapy: Minimal Therapy (1 class of medications)  Non-Invasive Test Results: Intermediate-risk stress test findings: cardiac mortality 1-3%/year  Prior CABG: No previous CABG      History and Physical Interval Note:  02/26/2018 8:18 AM  Jared Griffin  has presented today for surgery, with the diagnosis of as  The various methods of treatment have been discussed with the patient and family. After consideration of risks, benefits and other options for treatment, the patient has consented to  Procedure(s): LEFT HEART CATH AND CORONARY ANGIOGRAPHY (N/A) as a surgical intervention .  The patient's history has been reviewed, patient examined, no change in status, stable for surgery.  I have reviewed the patient's chart and labs.  Questions were answered to the patient's satisfaction.     Larae Grooms

## 2018-02-26 NOTE — Progress Notes (Signed)
Reported pt's blood sugar to Rennis Harding, RN from cath lab who stated she will let Lindsi (PA) know and to check for orders.

## 2018-02-26 NOTE — Discharge Instructions (Signed)
Drink plenty of fluids over next 48 hours and keep right wrist elevated at heart level for 24 hours ° °Radial Site Care °Refer to this sheet in the next few weeks. These instructions provide you with information about caring for yourself after your procedure. Your health care provider may also give you more specific instructions. Your treatment has been planned according to current medical practices, but problems sometimes occur. Call your health care provider if you have any problems or questions after your procedure. °What can I expect after the procedure? °After your procedure, it is typical to have the following: °· Bruising at the radial site that usually fades within 1-2 weeks. °· Blood collecting in the tissue (hematoma) that may be painful to the touch. It should usually decrease in size and tenderness within 1-2 weeks. ° °Follow these instructions at home: °· Take medicines only as directed by your health care provider. °· You may shower 24-48 hours after the procedure or as directed by your health care provider. Remove the bandage (dressing) and gently wash the site with plain soap and water. Pat the area dry with a clean towel. Do not rub the site, because this may cause bleeding. °· Do not take baths, swim, or use a hot tub until your health care provider approves. °· Check your insertion site every day for redness, swelling, or drainage. °· Do not apply powder or lotion to the site. °· Do not flex or bend the affected arm for 24 hours or as directed by your health care provider. °· Do not push or pull heavy objects with the affected arm for 24 hours or as directed by your health care provider. °· Do not lift over 10 lb (4.5 kg) for 5 days after your procedure or as directed by your health care provider. °· Ask your health care provider when it is okay to: °? Return to work or school. °? Resume usual physical activities or sports. °? Resume sexual activity. °· Do not drive home if you are discharged the  same day as the procedure. Have someone else drive you. °· You may drive 24 hours after the procedure unless otherwise instructed by your health care provider. °· Do not operate machinery or power tools for 24 hours after the procedure. °· If your procedure was done as an outpatient procedure, which means that you went home the same day as your procedure, a responsible adult should be with you for the first 24 hours after you arrive home. °· Keep all follow-up visits as directed by your health care provider. This is important. °Contact a health care provider if: °· You have a fever. °· You have chills. °· You have increased bleeding from the radial site. Hold pressure on the site. °Get help right away if: °· You have unusual pain at the radial site. °· You have redness, warmth, or swelling at the radial site. °· You have drainage (other than a small amount of blood on the dressing) from the radial site. °· The radial site is bleeding, and the bleeding does not stop after 30 minutes of holding steady pressure on the site. °· Your arm or hand becomes pale, cool, tingly, or numb. °This information is not intended to replace advice given to you by your health care provider. Make sure you discuss any questions you have with your health care provider. °Document Released: 10/06/2010 Document Revised: 02/09/2016 Document Reviewed: 03/22/2014 °Elsevier Interactive Patient Education © 2018 [employer] ° °

## 2018-02-27 MED FILL — Heparin Sod (Porcine)-NaCl IV Soln 1000 Unit/500ML-0.9%: INTRAVENOUS | Qty: 1000 | Status: AC

## 2018-03-05 ENCOUNTER — Encounter: Payer: Self-pay | Admitting: Orthopedic Surgery

## 2018-03-11 ENCOUNTER — Other Ambulatory Visit: Payer: Self-pay | Admitting: Family Medicine

## 2018-03-12 ENCOUNTER — Encounter: Payer: Self-pay | Admitting: Cardiovascular Disease

## 2018-03-12 ENCOUNTER — Ambulatory Visit (INDEPENDENT_AMBULATORY_CARE_PROVIDER_SITE_OTHER): Payer: PRIVATE HEALTH INSURANCE | Admitting: Cardiovascular Disease

## 2018-03-12 VITALS — BP 106/80 | HR 94 | Ht 74.0 in | Wt 234.0 lb

## 2018-03-12 DIAGNOSIS — E119 Type 2 diabetes mellitus without complications: Secondary | ICD-10-CM | POA: Diagnosis not present

## 2018-03-12 DIAGNOSIS — E781 Pure hyperglyceridemia: Secondary | ICD-10-CM | POA: Diagnosis not present

## 2018-03-12 DIAGNOSIS — Z955 Presence of coronary angioplasty implant and graft: Secondary | ICD-10-CM | POA: Diagnosis not present

## 2018-03-12 DIAGNOSIS — I10 Essential (primary) hypertension: Secondary | ICD-10-CM

## 2018-03-12 DIAGNOSIS — I25118 Atherosclerotic heart disease of native coronary artery with other forms of angina pectoris: Secondary | ICD-10-CM

## 2018-03-12 DIAGNOSIS — E785 Hyperlipidemia, unspecified: Secondary | ICD-10-CM

## 2018-03-12 NOTE — Patient Instructions (Signed)

## 2018-03-12 NOTE — Telephone Encounter (Signed)
Left message to return call to get information on dose and how patient is taking this med due to comment saying it was d/c by another providers for change in therapy

## 2018-03-12 NOTE — Progress Notes (Signed)
SUBJECTIVE: The patient presents for follow-up after undergoing coronary angiography on 02/26/2018.  This demonstrated a widely patent LAD stent with minimal restenosis.  LVEDP was normal as was left ventricular systolic function, LVEF 55 to 65% by visual estimate.  There was some 25% lesions in the second diagonal and mid LAD and a 10% stenosis in the mid RCA.  He tries to stand side but does some lawn mowing.  He denies chest pain.  He has some shortness of breath which is stable.  He denies palpitations and leg swelling.  Checks his blood pressure at home and it is normal.     Review of Systems: As per "subjective", otherwise negative.  Allergies  Allergen Reactions  . Metoprolol Rash    Current Outpatient Medications  Medication Sig Dispense Refill  . ACCU-CHEK AVIVA PLUS test strip CHECK BLOOD SUGAR UP TO FOUR TIMES A DAY. 100 each 5  . acetaminophen (TYLENOL) 500 MG tablet Take 1,000 mg by mouth daily as needed for moderate pain or headache.     . ALPRAZolam (XANAX) 0.5 MG tablet Take 1 tablet (0.5 mg total) by mouth 3 (three) times daily. 90 tablet 2  . aspirin (ASPIR-LOW) 81 MG EC tablet Take 1 tablet (81 mg total) by mouth daily. 30 tablet 12  . atorvastatin (LIPITOR) 40 MG tablet TAKE ONE (1) TABLET BY MOUTH EVERY DAY 30 tablet 6  . diltiazem (CARDIZEM) 30 MG tablet Take 1 tablet (30 mg total) by mouth 2 (two) times daily. 180 tablet 1  . gabapentin (NEURONTIN) 300 MG capsule Take 600 mg by mouth 2 (two) times daily.     Marland Kitchen glipiZIDE (GLUCOTROL) 5 MG tablet Take 2 tablets (10 mg total) by mouth 2 (two) times daily. 360 tablet 1  . HYDROcodone-acetaminophen (NORCO) 5-325 MG tablet Take 1 tablet by mouth every 6 (six) hours as needed for moderate pain. 28 tablet 0  . Icosapent Ethyl (VASCEPA) 1 g CAPS Take 2 capsules (2 g total) by mouth 2 (two) times daily. 360 capsule 3  . INVOKANA 300 MG TABS tablet TAKE ONE TABLET BY MOUTH ONCE DAILY WITH BREAKFAST. 30 tablet 5  .  losartan (COZAAR) 25 MG tablet TAKE ONE (1) TABLET BY MOUTH EVERY DAY 90 tablet 1  . metFORMIN (GLUCOPHAGE) 500 MG tablet Take 1-2 tablets (500-1,000 mg total) by mouth See admin instructions. TAKE 1000 MG BY MOUTH EVERY MORNING, THEN 500 MG BY MOUTH AT NOON, THEN 1000 MG BY MOUTH EVERY EVENING    . nitroGLYCERIN (NITROSTAT) 0.4 MG SL tablet Place 1 tablet (0.4 mg total) under the tongue every 5 (five) minutes as needed for chest pain. 25 tablet 3  . pantoprazole (PROTONIX) 40 MG tablet TAKE (1) TABLET BY MOUTH ONCE DAILY. 90 tablet 1  . ranolazine (RANEXA) 500 MG 12 hr tablet Take 1 tablet (500 mg total) by mouth 2 (two) times daily. 60 tablet 6  . sertraline (ZOLOFT) 100 MG tablet Take 1 tablet (100 mg total) by mouth daily. 90 tablet 2   No current facility-administered medications for this visit.     Past Medical History:  Diagnosis Date  . Anxiety   . 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 Coronary atherosclerosis of native coronary artery    a. 12/09/2013: s/p PCI with 3.0 x 28 Promus DES to mLAD  . Cyst (solitary) of breast 10/03/2017   removed from back  . Depression   . Elbow injury 07/25/2017  surgery for torn tendon  . Essential hypertension, benign   . Fatty liver disease, nonalcoholic   . Lateral epicondylitis of right elbow   . Mixed hyperlipidemia 2006  . Obesity   . Osgood-Schlatter's disease of right knee   . Type 2 diabetes mellitus ([place])     Past Surgical History:  Procedure Laterality Date  . CARDIAC CATHETERIZATION  2011  . CARDIAC CATHETERIZATION N/A 09/20/2015   Procedure: Left Heart Cath and Coronary Angiography;  Surgeon: [name], MD;  Location: [place] [facility];  Service: Cardiovascular;  Laterality: N/A;  . CHOLECYSTECTOMY  2003  . COLONOSCOPY  06/19/2012   Procedure: COLONOSCOPY;  Surgeon: [name], MD;  [facility];  Service: Endoscopy;  Laterality: N/A;  930  . COLONOSCOPY N/A 10/17/2017   Procedure:  COLONOSCOPY;  Surgeon: [name], MD;  [facility];  Service: Endoscopy;  Laterality: N/A;  1225  . KNEE ARTHROPLASTY Right 1999  . LEFT HEART CATH AND CORONARY ANGIOGRAPHY N/A 02/26/2018   Procedure: LEFT HEART CATH AND CORONARY ANGIOGRAPHY;  Surgeon: [name], MD;  Location: [place] [facility];  Service: Cardiovascular;  Laterality: N/A;  . LEFT HEART CATHETERIZATION WITH CORONARY ANGIOGRAM N/A 12/09/2013   Procedure: LEFT HEART CATHETERIZATION WITH CORONARY ANGIOGRAM;  Surgeon: [name], MD;  Location: [facility];  Service: Cardiovascular;  Laterality: N/A;  . Liver biopsy    . POLYPECTOMY  10/17/2017   Procedure: POLYPECTOMY;  Surgeon: [name], MD;  [facility];  Service: Endoscopy;;  ascending colon, splenic flexure,sigmoid x2  . TENNIS ELBOW RELEASE/NIRSCHEL PROCEDURE Right 07/25/2017   Procedure: TENNIS ELBOW RELEASE and debriedment;  Surgeon: [name], MD;  Location: [facility];  Service: Orthopedics;  Laterality: Right;  . TONSILLECTOMY  1970's    Social History   Socioeconomic History  . Marital status: Married    Spouse name: Not on file  . Number of children: Not on file  . Years of education: Not on file  . Highest education level: Not on file  Occupational History  . Not on file  Social Needs  . Financial resource strain: Not on file  . Food insecurity:    Worry: Not on file    Inability: Not on file  . Transportation needs:    Medical: Not on file    Non-medical: Not on file  Tobacco Use  . Smoking status: Former Smoker    Packs/day: 0.50    Years: 10.00    Pack years: 5.00    Types: Cigarettes    Start date: 02/02/1973    Last attempt to quit: 09/17/1983    Years since quitting: 34.5  . Smokeless tobacco: Former Systems developer    Types: [place] date: 09/17/1983  Substance and Sexual Activity  . Alcohol use: No    Alcohol/week: 0.0 oz    Comment: 11-30-15 per pt no  . Drug use: No    Comment:  11-30-15 per pt no  . Sexual activity: Yes  Lifestyle  . Physical activity:    Days per week: Not on file    Minutes per session: Not on file  . Stress: Not on file  Relationships  . Social connections:    Talks on phone: Not on file    Gets together: Not on file    Attends religious service: Not on file    Active member of club or organization: Not on file  Attends meetings of clubs or organizations: Not on file    Relationship status: Not on file  . Intimate partner violence:    Fear of current or ex partner: Not on file    Emotionally abused: Not on file    Physically abused: Not on file    Forced sexual activity: Not on file  Other Topics Concern  . 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    Married with 1 child   Right handed    12 th    Very little caffeine     Vitals:   03/12/18 1531  BP: 106/80  Pulse: 94  SpO2: 93%  Weight: 234 lb (106.1 kg)  Height: 6' 2"  (1.88 m)    Wt Readings from Last 3 Encounters:  03/12/18 234 lb (106.1 kg)  02/26/18 235 lb (106.6 kg)  02/17/18 240 lb (108.9 kg)     PHYSICAL EXAM General: NAD HEENT: Normal. Neck: No JVD, no thyromegaly. Lungs: Clear to auscultation bilaterally with normal respiratory effort. CV: Regular rate and rhythm, normal S1/S2, no S3/S4, no murmur. No pretibial or periankle edema.  Abdomen: Soft, nontender, no distention.  Neurologic: Alert and oriented.  Psych: Normal affect. Skin: Normal. Musculoskeletal: No gross deformities.    ECG: Reviewed above under Subjective   Labs: Lab Results  Component Value Date/Time   K 4.3 02/18/2018 10:21 AM   BUN 14 02/18/2018 10:21 AM   CREATININE 0.83 02/18/2018 10:21 AM   CREATININE 0.96 12/07/2013 09:50 AM   ALT 26 04/13/2017 08:48 AM   TSH 0.458 04/13/2017 08:48 AM   HGB 15.9 02/18/2018 10:21 AM     Lipids: Lab Results  Component Value Date/Time   LDLCALC  Comment 08/06/2017 08:40 AM   CHOL 171 08/06/2017 08:40 AM   TRIG 567 ([place]) 08/06/2017 08:40 AM   HDL 31 (L) 08/06/2017 08:40 AM       ASSESSMENT AND PLAN:  1. CAD and LAD stent in 11/2013:Coronary angiography reviewed above with no significant stenosis.  Continue aspirin, Lipitor, and Ranexa.  2. Essential HTN:  Blood pressure is normal.  No changes to therapy.  3. Hyperlipidemia:Continue atorvastatin 40 mg.  Lipids reviewed from 08/06/2017.  Total cholesterol 171, triglycerides 567, HDL 31, LDL cannot be calculated. I previously started Vascepa 2 gm bid.  4.  Type 2 diabetes mellitus: Currently treated with glipizide, Invokana, and metformin.  HbA1c 7.4% on 12/02/2017.    Disposition: Follow up 1 year   Kate Sable, M.D., F.A.C.C.

## 2018-03-13 ENCOUNTER — Encounter (INDEPENDENT_AMBULATORY_CARE_PROVIDER_SITE_OTHER): Payer: Self-pay | Admitting: Physical Medicine and Rehabilitation

## 2018-03-13 ENCOUNTER — Ambulatory Visit (INDEPENDENT_AMBULATORY_CARE_PROVIDER_SITE_OTHER): Payer: Worker's Compensation | Admitting: Physical Medicine and Rehabilitation

## 2018-03-13 DIAGNOSIS — R202 Paresthesia of skin: Secondary | ICD-10-CM | POA: Diagnosis not present

## 2018-03-13 DIAGNOSIS — R531 Weakness: Secondary | ICD-10-CM

## 2018-03-13 NOTE — Progress Notes (Signed)
 .  Numeric Pain Rating Scale and Functional Assessment Average Pain 8   In the last MONTH (on 0-10 scale) has pain interfered with the following?  1. General activity like being  able to carry out your everyday physical activities such as walking, climbing stairs, carrying groceries, or moving a chair?  Rating(8)   

## 2018-03-13 NOTE — Progress Notes (Signed)
Jared Griffin - 57 y.o. male MRN [id number]  Date of birth: Oct 13, 1960  Office Visit Note: Visit Date: 03/13/2018 PCP: Jared Griffin Referred by: Jared Griffin  Subjective: Chief Complaint  Patient presents with  . Right Arm - Pain   HPI: Jared Griffin is a 57 year old right-hand-dominant gentleman that comes in with a request of Jared Griffin for electrodiagnostic study of the right upper limb with main concern for radial nerve problems.  The patient reports surgery of the lateral forearm in 2018 by Jared Griffin.  This was a surgery for lateral epicondylitis.  The patient reports continued problems with ability to rotate the arm to the pain.  He talks about more pronation pain and supination pain he does get pain that extends from the elbow down to the top of the hand.  He has to sleep with his right arm elevated.  He denies any specific numbness tingling or weakness.  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  He has not had prior electrodiagnostic studies of the upper extremities.  He has had electrodiagnostic studies of the lower limbs.  He reports since the surgery what he refers to as trembling in the right hand.  He shows me today an attention tremor on the right but also on exam has left-sided tremor as well.  He had not noticed the left side before.  He reports seeing a psychologist to commented that they had looked at the procedure report of the surgery and noted that there was some movement of the nerve by Jared Griffin.  I read through the notes that we have available and did not see that listed and I have asked the patient to ask Jared Griffin about any nerve involvement with the surgery.   ROS Otherwise per HPI.  Assessment & Plan: Visit Diagnoses:  1. Paresthesia of skin   2. Weakness       Plan: No additional findings.  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  This appears asymptomatic and does not seem to sit with his current symptoms.    There is no significant electrodiagnostic evidence of any other focal nerve entrapment (specifically no radial nerve entrapment seen), brachial plexopathy or cervical radiculopathy.   **This electrodiagnostic study is more specific then sensitive for radial tunnel syndrome and does not rule out the possibility of this condition.  Recommendations: 1.  Follow-up with referring physician. 2.  Continue current management of symptoms.   Meds & Orders: No orders of the defined types were placed in this encounter.   Orders Placed This Encounter  Procedures  . NCV with EMG (electromyography)    Follow-up: Return for Jared Griffin.   Procedures: No procedures performed  EMG & NCV Findings: Evaluation of the right ulnar motor nerve showed decreased conduction velocity (B Elbow-Wrist, 51 m/s).  The right median (across palm) sensory nerve showed prolonged distal peak latency (Wrist, 4.0 ms) and prolonged distal peak latency (Palm, 2.2 ms).  All remaining nerves (as indicated in the following tables) were within normal limits.    All examined muscles (as indicated in the following table) showed no evidence of electrical instability.    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  This appears asymptomatic and does not seem to sit with his current symptoms.    There is no significant electrodiagnostic evidence of any other focal nerve entrapment (specifically no radial nerve entrapment seen), brachial plexopathy or cervical radiculopathy.   **This electrodiagnostic study is more specific  then sensitive for radial tunnel syndrome and does not rule out the possibility of this condition.  Recommendations: 1.  Follow-up with referring physician. 2.  Continue current management of symptoms.  ___________________________ Jared Griffin FAAPMR Board Certified, American Board of Physical Medicine and Rehabilitation    Nerve Conduction Studies Anti Sensory Summary Table   Stim Site NR Peak (ms) Norm Peak (ms) P-T Amp (V) Norm P-T Amp Site1 Site2 Delta-P (ms) Dist (cm) Vel (m/s) Norm Vel (m/s)  Right Median Acr Palm Anti Sensory (2nd Digit)  31.5C  Wrist    *4.0 <3.6 21.1 >10 Wrist Palm 1.8 0.0    Palm    *2.2 <2.0 24.7         Right Radial Anti Sensory (Base 1st Digit)  31.3C  Wrist    2.3 <3.1 16.6  Wrist Base 1st Digit 2.3 0.0    Right Ulnar Anti Sensory (5th Digit)  31.5C  Wrist    3.6 <3.7 23.2 >15.0 Wrist 5th Digit 3.6 14.0 39 >38   Motor Summary Table   Stim Site NR Onset (ms) Norm Onset (ms) O-P Amp (mV) Norm O-P Amp Site1 Site2 Delta-0 (ms) Dist (cm) Vel (m/s) Norm Vel (m/s)  Right Median Motor (Abd Poll Brev)  31.3C  Wrist    4.0 <4.2 5.4 >5 Elbow Wrist 4.3 22.0 51 >50  Elbow    8.3  5.4         Right Radial Motor (Ext Indicis)  31.2C  8cm    2.1 <2.5 4.1 >1.7 Up Arm 8cm 3.9 24.0 62 >60  Up Arm    6.0  5.7         Right Ulnar Motor (Abd Dig Min)  31.5C  Wrist    3.7 <4.2 4.6 >3 B Elbow Wrist 4.7 24.0 *51 >53  B Elbow    8.4  3.8  A Elbow B Elbow 1.6 10.0 63 >53  A Elbow    10.0  4.6          EMG   Side Muscle Nerve Root Ins Act Fibs Psw Amp Dur Poly Recrt Int Fraser Din Comment  Right Abd Poll Brev Median C8-T1 Nml Nml Nml Nml Nml 0 Nml Nml   Right 1stDorInt Ulnar C8-T1 Nml Nml Nml Nml Nml 0 Nml Nml   Right PronatorTeres Median C6-7 Nml Nml Nml Nml Nml 0 Nml Nml   Right Deltoid Axillary C5-6 Nml Nml Nml Nml Nml 0 Nml Nml   Right Triceps Radial C6-7-8 Nml Nml Nml Nml Nml 0 Nml Nml   Right Ext Digitorum  Radial (Post Int) C7-8 Nml Nml Nml Nml Nml 0 Nml Nml     Right ExtIndicis Radial (Post Int) C7-8 Nml Nml Nml Nml Nml 0 Nml Nml     Nerve Conduction Studies Anti Sensory Left/Right Comparison   Stim Site L Lat (ms) R Lat (ms) L-R Lat (ms) L Amp (V) R Amp (V) L-R Amp (%) Site1 Site2 L Vel (m/s) R Vel (m/s) L-R Vel (m/s)  Median Acr Palm Anti Sensory (2nd Digit)  31.5C  Wrist  *4.0  21.1  Wrist Palm     Palm  *2.2   24.7        Radial Anti Sensory (Base 1st Digit)  31.3C  Wrist  2.3   16.6  Wrist Base 1st Digit     Ulnar Anti Sensory (5th Digit)  31.5C  Wrist  3.6   23.2  Wrist 5th Digit  39    Motor Left/Right Comparison   Stim Site L Lat (ms) R Lat (ms) L-R Lat (ms) L Amp (mV) R Amp (mV) L-R Amp (%) Site1 Site2 L Vel (m/s) R Vel (m/s) L-R Vel (m/s)  Median Motor (Abd Poll Brev)  31.3C  Wrist  4.0   5.4  Elbow Wrist  51   Elbow  8.3   5.4        Radial Motor (Ext Indicis)  31.2C  8cm  2.1   4.1  Up Arm 8cm  62   Up Arm  6.0   5.7        Ulnar Motor (Abd Dig Min)  31.5C  Wrist  3.7   4.6  B Elbow Wrist  *51   B Elbow  8.4   3.8  A Elbow B Elbow  63   A Elbow  10.0   4.6           Waveforms:             Clinical History: No specialty comments available.   He reports that he quit smoking about 34 years ago. His smoking use included cigarettes. He started smoking about 45 years ago. He has a 5.00 pack-year smoking history. He quit smokeless tobacco use about 34 years ago. His smokeless tobacco use included chew.  Recent Labs    07/23/17 1256 12/02/17 1540  HGBA1C 8.5* 7.4    Objective:  VS:  HT:    WT:   BMI:     BP:   HR: bpm  TEMP: ( )  RESP:  Physical Exam  Constitutional: He is oriented to person, place, and time.  Musculoskeletal:  Inspection reveals well-healed lateral surgical scar over the lateral epicondyle but no atrophy of the bilateral extensor musculature or APB or FDI or hand intrinsics. There is no swelling, color changes, allodynia or dystrophic changes. There is 5 out of 5 strength in the  bilateral wrist extension, finger abduction and long finger flexion.  He does have some pain over the extensor wad with palpation on the right.  He does have pain with resisted pronation.  There is intact sensation to light touch in all dermatomal and peripheral nerve distributions. There is a negative Hoffmann's test bilaterally.  There is what appears to be essential tremor in both right and left hands may be more prominent on the right than left.  There is no pill-rolling or changes in tone.  There is no cogwheeling.  Neurological: He is alert and oriented to person, place, and time. No sensory deficit. He exhibits normal muscle tone. Coordination normal.    Ortho Exam Imaging: No results found.  Past Medical/Family/Surgical/Social History: Medications & Allergies reviewed per EMR, new medications updated. Patient Active Problem List   Diagnosis Date Noted  . Abnormal nuclear stress test   . Aftercare following surgery 07/25/17 08/13/2017  . Hx of colonic polyps 08/07/2017  . Lateral epicondylitis of right elbow   . Cervical nerve root impingement 12/09/2015  . Generalized anxiety disorder 09/27/2015  . Chest pain 09/20/2015  . Pain in the chest   .  Depression 01/24/2015  . Esophageal reflux 01/24/2015  . Preoperative cardiovascular examination 06/28/2014  . Type 2 diabetes mellitus ([place])   . Mixed hyperlipidemia   . Cirrhosis of liver without mention of alcohol   . Fatty liver disease, nonalcoholic   . Obesity   . Intermediate coronary syndrome ([place]) 12/09/2013  . Fatty liver 05/05/2013  . Lumbago 05/21/2012  . Essential hypertension, benign 12/28/2009  . CAD S/P LAD DES March 2015 12/28/2009  . Hyperlipidemia LDL goal <70 11/25/2009  . Accelerating angina ([place]) 11/25/2009  . DM 11/24/2009  . ABDOMINAL PAIN, HX OF 11/24/2009   Past Medical History:  Diagnosis Date  . Anxiety   . 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 Coronary atherosclerosis of native  coronary artery    a. 12/09/2013: s/p PCI with 3.0 x 28 Promus DES to mLAD  . Cyst (solitary) of breast 10/03/2017   removed from back  . Depression   . Elbow injury 07/25/2017   surgery for torn tendon  . Essential hypertension, benign   . Fatty liver disease, nonalcoholic   . Lateral epicondylitis of right elbow   . Mixed hyperlipidemia 2006  . Obesity   . Osgood-Schlatter's disease of right knee   . Type 2 diabetes mellitus ([facility])    Family History  Problem Relation Age of Onset  . Cancer Maternal Aunt   . CVA Maternal Grandmother   . CVA Maternal Grandfather    Past Surgical History:  Procedure Laterality Date  . CARDIAC CATHETERIZATION  2011  . CARDIAC CATHETERIZATION N/A 09/20/2015   Procedure: Left Heart Cath and Coronary Angiography;  Surgeon: [name], Griffin;  Location: [place] [facility];  Service: Cardiovascular;  Laterality: N/A;  . CHOLECYSTECTOMY  2003  . COLONOSCOPY  06/19/2012   Procedure: COLONOSCOPY;  Surgeon: [name], Griffin;  [facility];  Service: Endoscopy;  Laterality: N/A;  930  . COLONOSCOPY N/A 10/17/2017   Procedure: COLONOSCOPY;  Surgeon: [name], Griffin;  [facility];  Service: Endoscopy;  Laterality: N/A;  1225  . KNEE ARTHROPLASTY Right 1999  . LEFT HEART CATH AND CORONARY ANGIOGRAPHY N/A 02/26/2018   Procedure: LEFT HEART CATH AND CORONARY ANGIOGRAPHY;  Surgeon: [name], Griffin;  Location: [place] [facility];  Service: Cardiovascular;  Laterality: N/A;  . LEFT HEART CATHETERIZATION WITH CORONARY ANGIOGRAM N/A 12/09/2013   Procedure: LEFT HEART CATHETERIZATION WITH CORONARY ANGIOGRAM;  Surgeon: [name], Griffin;  Location: [facility];  Service: Cardiovascular;  Laterality: N/A;  . Liver biopsy    . POLYPECTOMY  10/17/2017   Procedure: POLYPECTOMY;  Surgeon: [name], Griffin;  [facility];  Service: Endoscopy;;  ascending colon, splenic flexure,sigmoid x2  . TENNIS ELBOW  RELEASE/NIRSCHEL PROCEDURE Right 07/25/2017   Procedure: TENNIS ELBOW RELEASE and debriedment;  Surgeon: [name], Griffin;  Location: [facility];  Service: Orthopedics;  Laterality: Right;  . TONSILLECTOMY  1970's   Social History   Occupational History  . Not on file  Tobacco Use  . Smoking status: Former Smoker    Packs/day: 0.50    Years: 10.00    Pack years: 5.00    Types: Cigarettes    Start date: 02/02/1973    Last attempt to quit: 09/17/1983    Years since quitting: 34.5  . Smokeless tobacco: Former Systems developer    Types: [place] date: 09/17/1983  Substance and Sexual Activity  . Alcohol use:  No    Alcohol/week: 0.0 oz    Comment: 11-30-15 per pt no  . Drug use: No    Comment: 11-30-15 per pt no  . Sexual activity: Yes

## 2018-03-14 NOTE — Procedures (Signed)
EMG & NCV Findings: Evaluation of the right ulnar motor nerve showed decreased conduction velocity (B Elbow-Wrist, 51 m/s).  The right median (across palm) sensory nerve showed prolonged distal peak latency (Wrist, 4.0 ms) and prolonged distal peak latency (Palm, 2.2 ms).  All remaining nerves (as indicated in the following tables) were within normal limits.    All examined muscles (as indicated in the following table) showed no evidence of electrical instability.    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  This appears asymptomatic and does not seem to sit with his current symptoms.    There is no significant electrodiagnostic evidence of any other focal nerve entrapment (specifically no radial nerve entrapment seen), brachial plexopathy or cervical radiculopathy.   **This electrodiagnostic study is more specific then sensitive for radial tunnel syndrome and does not rule out the possibility of this condition.  Recommendations: 1.  Follow-up with referring physician. 2.  Continue current management of symptoms.  ___________________________ Laurence Spates FAAPMR Board Certified, American Board of Physical Medicine and Rehabilitation    Nerve Conduction Studies Anti Sensory Summary Table   Stim Site NR Peak (ms) Norm Peak (ms) P-T Amp (V) Norm P-T Amp Site1 Site2 Delta-P (ms) Dist (cm) Vel (m/s) Norm Vel (m/s)  Right Median Acr Palm Anti Sensory (2nd Digit)  31.5C  Wrist    *4.0 <3.6 21.1 >10 Wrist Palm 1.8 0.0    Palm    *2.2 <2.0 24.7         Right Radial Anti Sensory (Base 1st Digit)  31.3C  Wrist    2.3 <3.1 16.6  Wrist Base 1st Digit 2.3 0.0    Right Ulnar Anti Sensory (5th Digit)  31.5C  Wrist    3.6 <3.7 23.2 >15.0 Wrist 5th Digit 3.6 14.0 39 >38   Motor Summary Table   Stim Site NR Onset (ms) Norm Onset (ms) O-P Amp (mV) Norm O-P Amp Site1 Site2  Delta-0 (ms) Dist (cm) Vel (m/s) Norm Vel (m/s)  Right Median Motor (Abd Poll Brev)  31.3C  Wrist    4.0 <4.2 5.4 >5 Elbow Wrist 4.3 22.0 51 >50  Elbow    8.3  5.4         Right Radial Motor (Ext Indicis)  31.2C  8cm    2.1 <2.5 4.1 >1.7 Up Arm 8cm 3.9 24.0 62 >60  Up Arm    6.0  5.7         Right Ulnar Motor (Abd Dig Min)  31.5C  Wrist    3.7 <4.2 4.6 >3 B Elbow Wrist 4.7 24.0 *51 >53  B Elbow    8.4  3.8  A Elbow B Elbow 1.6 10.0 63 >53  A Elbow    10.0  4.6          EMG   Side Muscle Nerve Root Ins Act Fibs Psw Amp Dur Poly Recrt Int Fraser Din Comment  Right Abd Poll Brev Median C8-T1 Nml Nml Nml Nml Nml 0 Nml Nml   Right 1stDorInt Ulnar C8-T1 Nml Nml Nml Nml Nml 0 Nml Nml   Right PronatorTeres Median C6-7 Nml Nml Nml Nml Nml 0 Nml Nml   Right Deltoid Axillary C5-6 Nml Nml Nml Nml Nml 0 Nml Nml   Right Triceps Radial C6-7-8 Nml Nml Nml Nml Nml 0 Nml Nml   Right Ext Digitorum  Radial (Post Int) C7-8 Nml Nml Nml  Nml Nml 0 Nml Nml   Right ExtIndicis Radial (Post Int) C7-8 Nml Nml Nml Nml Nml 0 Nml Nml     Nerve Conduction Studies Anti Sensory Left/Right Comparison   Stim Site L Lat (ms) R Lat (ms) L-R Lat (ms) L Amp (V) R Amp (V) L-R Amp (%) Site1 Site2 L Vel (m/s) R Vel (m/s) L-R Vel (m/s)  Median Acr Palm Anti Sensory (2nd Digit)  31.5C  Wrist  *4.0   21.1  Wrist Palm     Palm  *2.2   24.7        Radial Anti Sensory (Base 1st Digit)  31.3C  Wrist  2.3   16.6  Wrist Base 1st Digit     Ulnar Anti Sensory (5th Digit)  31.5C  Wrist  3.6   23.2  Wrist 5th Digit  39    Motor Left/Right Comparison   Stim Site L Lat (ms) R Lat (ms) L-R Lat (ms) L Amp (mV) R Amp (mV) L-R Amp (%) Site1 Site2 L Vel (m/s) R Vel (m/s) L-R Vel (m/s)  Median Motor (Abd Poll Brev)  31.3C  Wrist  4.0   5.4  Elbow Wrist  51   Elbow  8.3   5.4        Radial Motor (Ext Indicis)  31.2C  8cm  2.1   4.1  Up Arm 8cm  62   Up Arm  6.0   5.7        Ulnar Motor (Abd Dig Min)  31.5C  Wrist  3.7   4.6  B  Elbow Wrist  *51   B Elbow  8.4   3.8  A Elbow B Elbow  63   A Elbow  10.0   4.6           Waveforms:

## 2018-03-24 ENCOUNTER — Ambulatory Visit (INDEPENDENT_AMBULATORY_CARE_PROVIDER_SITE_OTHER): Payer: Worker's Compensation | Admitting: Orthopedic Surgery

## 2018-03-24 VITALS — BP 117/73 | HR 81 | Ht 74.0 in | Wt 234.0 lb

## 2018-03-24 DIAGNOSIS — G8929 Other chronic pain: Secondary | ICD-10-CM

## 2018-03-24 DIAGNOSIS — M7711 Lateral epicondylitis, right elbow: Secondary | ICD-10-CM | POA: Diagnosis not present

## 2018-03-24 DIAGNOSIS — M25521 Pain in right elbow: Secondary | ICD-10-CM

## 2018-03-24 NOTE — Progress Notes (Signed)
Chief Complaint  Patient presents with  . Follow-up    Recheck on right elbow, DOS 07-25-17. NCS results    57 year old male had a tennis elbow release in November did not improve continue to have pain we sent him for nerve conduction study study to rule out radial tunnel syndrome.  The study was not consistent with radial tunnel syndrome he did have some unrelated carpal tunnel nerve pressure which was asymptomatic  He still complains of lateral elbow pain and tremor in the right arm  Review of systems he says that he had some tingling in his right arm involving the ulnar nerve last week but it was only for one episode  Review of Systems  Constitutional: Negative for chills and fever.  Neurological: Positive for tingling, sensory change and weakness.   BP 117/73   Pulse 81   Ht 6' 2"  (1.88 m)   Wt 234 lb (106.1 kg)   BMI 30.04 kg/m  Physical Exam  Constitutional: He appears well-developed and well-nourished. No distress.  Musculoskeletal:       Arms: Skin: Capillary refill takes less than 2 seconds.  Psychiatric: He has a normal mood and affect.    Encounter Diagnoses  Name Primary?  . Lateral epicondylitis of right elbow s/p release 07/25/17 Yes  . Chronic elbow pain, right     I injected the lateral epicondyle with the multiple point injection technique  Tolerated well  Follow-up in 2 weeks.  If no improvement recommend second opinion regarding his situation. There is no change in his work status over the next 2 weeks  Procedure note injection for right tennis elbow   Diagnosis right tennis elbow  Anesthesia ethyl chloride was used Alcohol use is clean the skin  After we obtained verbal consent and timeout a 25-gauge needle was used to inject 40 mg of Depo-Medrol and 3 cc of 1% lidocaine just distal to the insertion of the ECRB  There were no complications and a sterile bandage was applied.

## 2018-03-25 ENCOUNTER — Encounter: Payer: Self-pay | Admitting: Orthopedic Surgery

## 2018-03-25 ENCOUNTER — Other Ambulatory Visit: Payer: Self-pay | Admitting: Cardiovascular Disease

## 2018-04-02 ENCOUNTER — Ambulatory Visit: Payer: Self-pay | Admitting: Cardiovascular Disease

## 2018-04-04 ENCOUNTER — Ambulatory Visit: Payer: Self-pay | Admitting: Cardiovascular Disease

## 2018-04-07 ENCOUNTER — Encounter: Payer: Self-pay | Admitting: Orthopedic Surgery

## 2018-04-07 ENCOUNTER — Ambulatory Visit (INDEPENDENT_AMBULATORY_CARE_PROVIDER_SITE_OTHER): Payer: Worker's Compensation | Admitting: Orthopedic Surgery

## 2018-04-07 VITALS — BP 142/86 | HR 97 | Ht 74.0 in | Wt 234.0 lb

## 2018-04-07 DIAGNOSIS — G5631 Lesion of radial nerve, right upper limb: Secondary | ICD-10-CM | POA: Diagnosis not present

## 2018-04-07 DIAGNOSIS — M7711 Lateral epicondylitis, right elbow: Secondary | ICD-10-CM | POA: Diagnosis not present

## 2018-04-07 NOTE — Patient Instructions (Signed)
No work 3 months

## 2018-04-07 NOTE — Progress Notes (Signed)
Progress Note   Patient ID: Jared Griffin, male   DOB: July 29, 1961, 57 y.o.   MRN: [id number]   Chief Complaint  Patient presents with  . Elbow Pain    right no better after injection states same    57 year old male status post right lateral epicondyle release November 2018  He has not made significant improvement in his lateral elbow pain despite physical therapy repeat injection gabapentin hydrocodone topical medication  Prior injected him last 2 weeks ago he says it did not help at all  He continues to have significant apprehension with moving his right arm and he has a tremor which is been persistent throughout his treatment course even prior to surgery      Review of Systems  Neurological: Positive for tingling and tremors.     Allergies  Allergen Reactions  . Metoprolol Rash     BP (!) 142/86   Pulse 97   Ht 6' 2"  (1.88 m)   Wt 234 lb (106.1 kg)   BMI 30.04 kg/m   Physical Exam  Constitutional: He is oriented to person, place, and time. He appears well-developed and well-nourished.  Vital signs have been reviewed and are stable. Gen. appearance the patient is well-developed and well-nourished with normal grooming and hygiene.   Musculoskeletal:       Arms: Neurological: He is alert and oriented to person, place, and time.  Skin: Skin is warm and dry. No erythema.  Psychiatric: He has a normal mood and affect.  Vitals reviewed.    Medical decisions:   Data  Imaging:   No further imaging  Encounter Diagnoses  Name Primary?  . Lateral epicondylitis of right elbow s/p release 07/25/17 Yes  . 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  No work for 3 months    Arther Abbott, MD 04/07/2018 11:06 AM

## 2018-04-10 ENCOUNTER — Encounter: Payer: Self-pay | Admitting: Family Medicine

## 2018-04-10 ENCOUNTER — Ambulatory Visit: Payer: PRIVATE HEALTH INSURANCE | Admitting: Family Medicine

## 2018-04-10 VITALS — BP 134/88 | Temp 97.9°F | Ht 74.0 in | Wt 234.4 lb

## 2018-04-10 DIAGNOSIS — R0600 Dyspnea, unspecified: Secondary | ICD-10-CM | POA: Diagnosis not present

## 2018-04-10 NOTE — Progress Notes (Signed)
   Subjective:    Patient ID: Jared Griffin, male    DOB: Feb 09, 1961, 57 y.o.   MRN: [id number]  HPI Pt here today to discuss having trouble breathing. States this goes back to last summer, the heat makes it hard for him to breathe, dizziness, light headedness. Cardiologist diagnosed pt with Angina. Pt states it feels like he is breathing through a filter.    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  Feels dizzy and light headed  Gets too hot for breathing notes difficlty breathing like have to breath thru a filter  Has struggle catching win when out I the heat   Just runing up flight of steps gets a struggle to cath air with any tye of exertio   Dr Raliegh Ip said not angina and heart look good mn      Review of Systems No headache, no major weight loss or weight gain, no chest pain no back pain abdominal pain no change in bowel habits complete ROS otherwise negative     Objective:   Physical Exam   Alert and oriented, vitals reviewed and stable, NAD ENT-TM's and ext canals WNL bilat via otoscopic exam Soft palate, tonsils and post pharynx WNL via oropharyngeal exam Neck-symmetric, no masses; thyroid nonpalpable and nontender Pulmonary-no tachypnea or accessory muscle use; Clear without wheezes via auscultation Card--no abnrml murmurs, rhythm reg and rate WNL Carotid pulses symmetric, without bruits      Assessment & Plan:  Impression progressive dyspnea.  Old records reviewed.  Cardiologist note reviewed.  Prior pulmonary function test reviewed.  Patient notes this is worse in a hot environment.  Also on additional medications 1 heat intolerance.  Substantial smoke exposure history and personal smoking history.  Also not exercising since November because of serious injury will do pulmonary function test.  Plus chest x-ray.  O2 sat 99%.  Follow-up in 2 weeks for discussion

## 2018-04-14 ENCOUNTER — Encounter: Payer: Self-pay | Admitting: *Deleted

## 2018-04-16 ENCOUNTER — Telehealth: Payer: Self-pay | Admitting: Orthopedic Surgery

## 2018-04-16 ENCOUNTER — Other Ambulatory Visit (HOSPITAL_COMMUNITY): Payer: Self-pay | Admitting: Psychiatry

## 2018-04-16 NOTE — Telephone Encounter (Signed)
Called Worker's comp adjuster Jaquita Rector at [phone] at [facility]; also called patient, regarding status of 2nd opinion referral request; as no response has been received to fax sent 04/07/18. Left voice message for Ms.Curtis.

## 2018-04-22 ENCOUNTER — Ambulatory Visit (HOSPITAL_COMMUNITY)
Admission: RE | Admit: 2018-04-22 | Discharge: 2018-04-22 | Disposition: A | Discharge status: Home / Self Care | Payer: PRIVATE HEALTH INSURANCE | Source: Ambulatory Visit | Attending: Family Medicine | Admitting: Family Medicine

## 2018-04-22 DIAGNOSIS — R0600 Dyspnea, unspecified: Secondary | ICD-10-CM | POA: Insufficient documentation

## 2018-04-22 LAB — PULMONARY FUNCTION TEST
DL/VA % pred: 97 %
DL/VA: 4.74 ml/min/mmHg/L
DLCO unc % pred: 80 %
DLCO unc: 30.52 ml/min/mmHg
FEF 25-75 Post: 5.23 L/sec
FEF 25-75 Pre: 4.29 L/sec
FEF2575-%Change-Post: 21 %
FEF2575-%Pred-Post: 147 %
FEF2575-%Pred-Pre: 120 %
FEV1-%Change-Post: 5 %
FEV1-%Pred-Post: 95 %
FEV1-%Pred-Pre: 90 %
FEV1-Post: 4.08 L
FEV1-Pre: 3.85 L
FEV1FVC-%Change-Post: 1 %
FEV1FVC-%Pred-Pre: 110 %
FEV6-%Change-Post: 4 %
FEV6-%Pred-Post: 89 %
FEV6-%Pred-Pre: 85 %
FEV6-Post: 4.79 L
FEV6-Pre: 4.59 L
FEV6FVC-%Pred-Post: 104 %
FEV6FVC-%Pred-Pre: 104 %
FVC-%Change-Post: 4 %
FVC-%Pred-Post: 85 %
FVC-%Pred-Pre: 82 %
FVC-Post: 4.79 L
FVC-Pre: 4.59 L
Post FEV1/FVC ratio: 85 %
Post FEV6/FVC ratio: 100 %
Pre FEV1/FVC ratio: 84 %
Pre FEV6/FVC Ratio: 100 %
RV % pred: 83 %
RV: 1.99 L
TLC % pred: 87 %
TLC: 6.84 L

## 2018-04-22 MED ORDER — ALBUTEROL SULFATE (2.5 MG/3ML) 0.083% IN NEBU
2.5000 mg | INHALATION_SOLUTION | Freq: Once | RESPIRATORY_TRACT | Status: AC
  Administered 2018-04-22: 2.5 mg via RESPIRATORY_TRACT

## 2018-04-25 ENCOUNTER — Ambulatory Visit: Payer: PRIVATE HEALTH INSURANCE | Admitting: Family Medicine

## 2018-04-25 ENCOUNTER — Encounter: Payer: Self-pay | Admitting: Family Medicine

## 2018-04-25 VITALS — BP 118/80 | Ht 74.0 in | Wt 236.1 lb

## 2018-04-25 DIAGNOSIS — J4520 Mild intermittent asthma, uncomplicated: Secondary | ICD-10-CM

## 2018-04-25 MED ORDER — ALBUTEROL SULFATE HFA 108 (90 BASE) MCG/ACT IN AERS: 2.0000 | INHALATION_SPRAY | Freq: Four times a day (QID) | RESPIRATORY_TRACT | 2 refills | Status: DC | PRN

## 2018-04-25 MED ORDER — MECLIZINE HCL 25 MG PO TABS: 25.0000 mg | ORAL_TABLET | Freq: Three times a day (TID) | ORAL | 0 refills | Status: DC | PRN

## 2018-04-25 MED ORDER — AMOXICILLIN-POT CLAVULANATE 875-125 MG PO TABS
1.0000 | ORAL_TABLET | Freq: Two times a day (BID) | ORAL | 0 refills | Status: DC
Start: 2018-04-25 — End: 2018-06-23

## 2018-04-25 NOTE — Progress Notes (Signed)
   Subjective:    Patient ID: Jared Griffin, male    DOB: 04-20-1961, 57 y.o.   MRN: [id number] Patient arrives office for follow-up HPI  Patient is here today for a two week follow up on Pft results and chest x ray on Tuesday.  Patient has been experiencing dyspnea.  Worse with exertion.  Has been on disability due to [employer]. injuries surgery  Patient himself smoked for under 10 years.  Her graph patient has had secondary smoke exposure for some time.     Review of Systems No headache, no major weight loss or weight gain, no chest pain no back pain abdominal pain no change in bowel habits complete ROS otherwise negative     Objective:   Physical Exam  Alert vitals stable, NAD. Blood pressure good on repeat. HEENT normal. Lungs clear. Heart regular rate and rhythm.       Assessment & Plan:  Dyspnea multi factorial.  Discussed at length.  Pulmonary function test report reactive airway component.  This is discussed.  Patient will be prescribed albuterol for this.  In addition pulmonary function test revealed diminished FEV and FVC but not enough to fulfill criteria for true COPD.  Discussed.  Likely COPD equivalent but as noted not advanced enough to declare officially.  Long discussion in that regard to.  Must immediately avoid secondary smoke exposure.  Numerous questions answered.  I really do not think referral will add much to the patient's pulmonary status right now  Greater than 50% of this 25 minute face to face visit was spent in counseling and discussion and coordination of care regarding the above diagnosis/diagnosies

## 2018-05-08 NOTE — Telephone Encounter (Signed)
Patient called today, 05/08/18, to relay that he has been contacted by "a lady" who represents his Workers comp provider, and stated was told they are in process of finding him a provider in [place] for the 2nd opinion evaluation. States he also received an authorization request form to sign, which he has signed and returned for records, to [place], 3rd party provider.  Appointment pending.

## 2018-05-10 ENCOUNTER — Other Ambulatory Visit: Payer: Self-pay | Admitting: Family Medicine

## 2018-05-15 LAB — LIPID PANEL
Chol/HDL Ratio: 5 ratio (ref 0.0–5.0)
Cholesterol, Total: 161 mg/dL (ref 100–199)
HDL: 32 mg/dL — ABNORMAL LOW (ref >39)
LDL Calculated: 54 mg/dL (ref 0–99)
Triglycerides: 376 mg/dL — ABNORMAL HIGH (ref 0–149)
VLDL Cholesterol Cal: 75 mg/dL — ABNORMAL HIGH (ref 5–40)

## 2018-05-15 LAB — HEPATIC FUNCTION PANEL
ALT: 32 IU/L (ref 0–44)
AST: 20 IU/L (ref 0–40)
Albumin: 4.8 g/dL (ref 3.5–5.5)
Alkaline Phosphatase: 44 IU/L (ref 39–117)
Bilirubin Total: 0.7 mg/dL (ref 0.0–1.2)
Bilirubin, Direct: 0.23 mg/dL (ref 0.00–0.40)
Total Protein: 7.2 g/dL (ref 6.0–8.5)

## 2018-05-15 LAB — MICROALBUMIN / CREATININE URINE RATIO
Creatinine, Urine: 59.5 mg/dL
Microalb/Creat Ratio: 14.6 mg/g creat (ref 0.0–30.0)
Microalbumin, Urine: 8.7 ug/mL

## 2018-05-19 ENCOUNTER — Encounter: Payer: Self-pay | Admitting: Family Medicine

## 2018-05-20 ENCOUNTER — Ambulatory Visit (INDEPENDENT_AMBULATORY_CARE_PROVIDER_SITE_OTHER): Payer: PRIVATE HEALTH INSURANCE | Admitting: Psychiatry

## 2018-05-20 ENCOUNTER — Encounter (HOSPITAL_COMMUNITY): Payer: Self-pay | Admitting: Psychiatry

## 2018-05-20 VITALS — BP 122/82 | HR 84 | Ht 74.0 in | Wt 235.0 lb

## 2018-05-20 DIAGNOSIS — F321 Major depressive disorder, single episode, moderate: Secondary | ICD-10-CM | POA: Diagnosis not present

## 2018-05-20 DIAGNOSIS — Z87891 Personal history of nicotine dependence: Secondary | ICD-10-CM | POA: Diagnosis not present

## 2018-05-20 MED ORDER — SERTRALINE HCL 100 MG PO TABS: 100.0000 mg | ORAL_TABLET | Freq: Every day | ORAL | 2 refills | Status: DC

## 2018-05-20 MED ORDER — ALPRAZOLAM 0.5 MG PO TABS: ORAL_TABLET | ORAL | 2 refills | Status: DC

## 2018-05-20 NOTE — Progress Notes (Signed)
[place] MD/PA/NP OP Progress Note  05/20/2018 11:16 AM Jared Griffin  MRN:  [id number]  Chief Complaint:  Chief Complaint    Depression; Anxiety; Follow-up     HPI: This patient is a 57 year old married white male lives with his wife and 67 year old son in [place]. 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 zone  The patient was referred by his primary physician, Dr. [name], for further assessment and treatment of anxiety and depression.  The patient states that he is become increasingly depressed and anxious over the last couple of years due to work stress. He stated that he was always under pressure to get more things done than he had time to do. 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 He got to the point of hating going to work. Several years ago he had cardiac problems and had a stent placed. In January of this year he began developing dizzy spells chest pain headaches stomach aches. It got so bad that he was readmitted for cardiac catheterization but nothing new was found.  The patient was having difficulty sleeping constant worry and anxiety about his job sadness and anger. He was getting irritable with people at home. After talking to his primary care physician at length and decided to take him out on medical retirement. The patient has been on Wellbutrin SR 150 no gram twice a day for several weeks now. He had also been out in the past when his wife was sick. He has not seen any benefit from this but does feel tremendously better since he stopped working. He is also on Xanax 0.5 mg 3 times a day which he thinks has helped to some degree.  Since stopping working he's had a big turnaround. His sleep and energy have improved. 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 He is much less anxious and is no longer having the chest pain dizzy spells or stomachaches. He denies suicidal ideation or psychotic symptoms. He does not use drugs or alcohol  The patient returns after 3 months.  He did have a cardiac catheterization but there was no need for stent placement.  He was still having trouble breathing and had pulmonary function test done.  He does not qualify for diagnosis of COPD but as something sort of like this.  He now uses an albuterol inhaler and tries to stay out of the heat.  His wife smokes but she is trying not to smoke around him.  For the most part his mood has been fairly good.  He is trying to help a friend who has cancer as much as he can.  He still out of work to to his elbow injury with resultant surgery.  He is sleeping fairly well. Visit Diagnosis:    ICD-10-CM   1. Moderate single current episode of major depressive disorder ([place]) F32.1     Past Psychiatric History: none  Past Medical History:  Past Medical History:  Diagnosis Date  . Anxiety   . 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 Coronary atherosclerosis of native coronary artery    a. 12/09/2013: s/p PCI with 3.0 x 28 Promus DES to mLAD  . Cyst (solitary) of breast 10/03/2017   removed from back  .  Depression   . Elbow injury 07/25/2017   surgery for torn tendon  . Essential hypertension, benign   . Fatty liver disease, nonalcoholic   . Lateral epicondylitis of right elbow   . Mixed hyperlipidemia 2006  . Obesity   . Osgood-Schlatter's disease of right knee   . Type 2 diabetes mellitus ([place])     Past Surgical History:  Procedure Laterality Date  . CARDIAC CATHETERIZATION  2011  . CARDIAC CATHETERIZATION N/A 09/20/2015   Procedure: Left Heart Cath and Coronary Angiography;  Surgeon: [name], MD;  Location: [place] [facility];  Service: Cardiovascular;  Laterality: N/A;  . CHOLECYSTECTOMY  2003  . COLONOSCOPY  06/19/2012   Procedure:  COLONOSCOPY;  Surgeon: [name], MD;  [facility];  Service: Endoscopy;  Laterality: N/A;  930  . COLONOSCOPY N/A 10/17/2017   Procedure: COLONOSCOPY;  Surgeon: [name], MD;  Location: [facility];  Service: Endoscopy;  Laterality: N/A;  1225  . KNEE ARTHROPLASTY Right 1999  . LEFT HEART CATH AND CORONARY ANGIOGRAPHY N/A 02/26/2018   Procedure: LEFT HEART CATH AND CORONARY ANGIOGRAPHY;  Surgeon: [name], MD;  Location: [facility];  Service: Cardiovascular;  Laterality: N/A;  . LEFT HEART CATHETERIZATION WITH CORONARY ANGIOGRAM N/A 12/09/2013   Procedure: LEFT HEART CATHETERIZATION WITH CORONARY ANGIOGRAM;  Surgeon: [name], MD;  Location: [facility] CATH LAB;  Service: Cardiovascular;  Laterality: N/A;  . Liver biopsy    . POLYPECTOMY  10/17/2017   Procedure: POLYPECTOMY;  Surgeon: [name], MD;  [facility];  Service: Endoscopy;;  ascending colon, splenic flexure,sigmoid x2  . TENNIS ELBOW RELEASE/NIRSCHEL PROCEDURE Right 07/25/2017   Procedure: TENNIS ELBOW RELEASE and debriedment;  Surgeon: [name], MD;  Location: [facility];  Service: Orthopedics;  Laterality: Right;  . TONSILLECTOMY  1970's    Family Psychiatric History: none  Family History:  Family History  Problem Relation Age of Onset  . Cancer Maternal Aunt   . CVA Maternal Grandmother   . CVA Maternal Grandfather     Social History:  Social History   Socioeconomic History  . Marital status: Married    Spouse name: Not on file  . Number of children: Not on file  . Years of education: Not on file  . Highest education level: Not on file  Occupational History  . Not on file  Social Needs  . Financial resource strain: Not on file  . Food insecurity:    Worry: Not on file    Inability: Not on file  . Transportation needs:    Medical: Not on file    Non-medical: Not on file  Tobacco Use  . Smoking status: Former Smoker    Packs/day: 0.50     Years: 10.00    Pack years: 5.00    Types: Cigarettes    Start date: 02/02/1973    Last attempt to quit: 09/17/1983    Years since quitting: 34.6  . Smokeless tobacco: Former Systems developer    Types: [place] date: 09/17/1983  Substance and Sexual Activity  . Alcohol use: No    Alcohol/week: 0.0 standard drinks    Comment: 11-30-15 per pt no  . Drug use: No    Comment: 11-30-15 per pt no  . Sexual activity: Yes  Lifestyle  . Physical activity:    Days per week: Not on file    Minutes per session: Not on file  .  Stress: Not on file  Relationships  . Social connections:    Talks on phone: Not on file    Gets together: Not on file    Attends religious service: Not on file    Active member of club or organization: Not on file    Attends meetings of clubs or organizations: Not on file    Relationship status: Not on file  Other Topics Concern  . 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    Married with 1 child   Right handed    12 th    Very little caffeine    Allergies:  Allergies  Allergen Reactions  . Metoprolol Rash    Metabolic Disorder Labs: Lab Results  Component Value Date   HGBA1C 7.4 12/02/2017   MPG 197.25 07/23/2017   No results found for: PROLACTIN Lab Results  Component Value Date   CHOL 161 05/13/2018   TRIG 376 (H) 05/13/2018   HDL 32 (L) 05/13/2018   CHOLHDL 5.0 05/13/2018   VLDL 27.0 02/22/2014   LDLCALC 54 05/13/2018   LDLCALC Comment 08/06/2017   Lab Results  Component Value Date   TSH 0.458 04/13/2017   TSH 0.493 02/05/2014    Therapeutic Level Labs: No results found for: LITHIUM No results found for: VALPROATE No components found for:  CBMZ  Current Medications: Current Outpatient Medications  Medication Sig Dispense Refill  . ACCU-CHEK AVIVA PLUS test strip CHECK BLOOD SUGAR UP TO FOUR TIMES A DAY. 100 each 5  . acetaminophen (TYLENOL) 500 MG  tablet Take 1,000 mg by mouth daily as needed for moderate pain or headache.     . albuterol (PROVENTIL HFA;VENTOLIN HFA) 108 (90 Base) MCG/ACT inhaler Inhale 2 puffs into the lungs every 6 (six) hours as needed for wheezing or shortness of breath. 1 Inhaler 2  . ALPRAZolam (XANAX) 0.5 MG tablet TAKE (1) TABLET BY MOUTH (3) TIMES DAILY. 90 tablet 2  . amoxicillin-clavulanate (AUGMENTIN) 875-125 MG tablet Take 1 tablet by mouth 2 (two) times daily. 20 tablet 0  . aspirin (ASPIR-LOW) 81 MG EC tablet Take 1 tablet (81 mg total) by mouth daily. 30 tablet 12  . atorvastatin (LIPITOR) 40 MG tablet TAKE ONE (1) TABLET BY MOUTH EVERY DAY 30 tablet 6  . diltiazem (CARDIZEM) 30 MG tablet Take 1 tablet (30 mg total) by mouth 2 (two) times daily. 180 tablet 1  . gabapentin (NEURONTIN) 300 MG capsule Take 600 mg by mouth 2 (two) times daily.     Marland Kitchen glipiZIDE (GLUCOTROL) 5 MG tablet Take 2 tablets (10 mg total) by mouth 2 (two) times daily. 360 tablet 1  . HYDROcodone-acetaminophen (NORCO) 5-325 MG tablet Take 1 tablet by mouth every 6 (six) hours as needed for moderate pain. 28 tablet 0  . Icosapent Ethyl (VASCEPA) 1 g CAPS Take 2 capsules (2 g total) by mouth 2 (two) times daily. 360 capsule 3  . INVOKANA 300 MG TABS tablet TAKE ONE TABLET BY MOUTH ONCE DAILY WITH BREAKFAST. 30 tablet 5  . losartan (COZAAR) 25 MG tablet TAKE ONE (1) TABLET BY MOUTH EVERY DAY 90 tablet 1  . meclizine (ANTIVERT) 25 MG tablet Take 1 tablet (25 mg total) by mouth 3 (three) times daily as needed for dizziness. 30 tablet 0  . metFORMIN (GLUCOPHAGE) 500 MG tablet Take 1-2 tablets (500-1,000 mg total) by mouth See admin instructions. TAKE 1000 MG BY  MOUTH EVERY MORNING, THEN 500 MG BY MOUTH AT NOON, THEN 1000 MG BY MOUTH EVERY EVENING    . nitroGLYCERIN (NITROSTAT) 0.4 MG SL tablet Place 1 tablet (0.4 mg total) under the tongue every 5 (five) minutes as needed for chest pain. 25 tablet 3  . pantoprazole (PROTONIX) 40 MG tablet TAKE (1)  TABLET BY MOUTH ONCE DAILY. 90 tablet 0  . ranolazine (RANEXA) 500 MG 12 hr tablet TAKE (1) TABLET BY MOUTH TWICE DAILY. 60 tablet 6  . sertraline (ZOLOFT) 100 MG tablet Take 1 tablet (100 mg total) by mouth daily. 90 tablet 2   No current facility-administered medications for this visit.      Musculoskeletal: Strength & Muscle Tone: within normal limits Gait & Station: normal Patient leans: N/A  Psychiatric Specialty Exam: Review of Systems  Respiratory: Positive for shortness of breath.   Musculoskeletal: Positive for joint pain.  All other systems reviewed and are negative.   Blood pressure 122/82, pulse 84, height 6' 2"  (1.88 m), weight 235 lb (106.6 kg), SpO2 95 %.Body mass index is 30.17 kg/m.  General Appearance: Casual, Neat and Well Groomed  Eye Contact:  Good  Speech:  Clear and Coherent  Volume:  Normal  Mood:  Euthymic  Affect:  Congruent  Thought Process:  Goal Directed  Orientation:  Full (Time, Place, and Person)  Thought Content: Logical   Suicidal Thoughts:  No  Homicidal Thoughts:  No  Memory:  Immediate;   Good Recent;   Good Remote;   Good  Judgement:  Good  Insight:  Good  Psychomotor Activity:  Normal  Concentration:  Concentration: Good and Attention Span: Good  Recall:  Good  Fund of Knowledge: Good  Language: Good  Akathisia:  No  Handed:  Right  AIMS (if indicated): not done  Assets:  Communication Skills Desire for Improvement Resilience Social Support Talents/Skills  ADL's:  Intact  Cognition: WNL  Sleep:  Good   Screenings: PHQ2-9     Office Visit from 12/02/2017 in [facility] Visit from 08/02/2017 in [facility]  PHQ-2 Total Score  0  0  PHQ-9 Total Score  1  -       Assessment and Plan: This patient is a 57 year old male with a history of depression and anxiety.  He continues to do well on his current regimen.  He will continue Zoloft 100 mg daily for depression and Xanax 0.5 mg 3 times  daily for anxiety.  He will return to see me in 3 months   [name], MD 05/20/2018, 11:16 AM

## 2018-05-27 ENCOUNTER — Other Ambulatory Visit: Payer: Self-pay

## 2018-05-27 MED ORDER — ATORVASTATIN CALCIUM 40 MG PO TABS: ORAL_TABLET | ORAL | 3 refills | Status: DC

## 2018-06-10 ENCOUNTER — Other Ambulatory Visit: Payer: Self-pay | Admitting: Cardiovascular Disease

## 2018-06-18 NOTE — Telephone Encounter (Signed)
Called back to follow up - spoke with patient. 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 States appt was scheduled for 06/03/18 with [place]. States when he arrived to the appointment, he was advised worker's comp cancelled it, due to fact that it was set up with sports medicine specialist. In addition, states his records were not all sent. So patient was to hear further from worker's comp afterward,which he has not, despite calls made and messages left. States he will likely seek legal advice.  We are also following up with worker's comp.

## 2018-06-20 ENCOUNTER — Encounter: Payer: Self-pay | Admitting: Family Medicine

## 2018-06-23 ENCOUNTER — Ambulatory Visit (INDEPENDENT_AMBULATORY_CARE_PROVIDER_SITE_OTHER): Payer: PRIVATE HEALTH INSURANCE | Admitting: Family Medicine

## 2018-06-23 ENCOUNTER — Encounter: Payer: Self-pay | Admitting: Family Medicine

## 2018-06-23 VITALS — BP 132/84 | Temp 98.3°F | Ht 74.0 in | Wt 238.8 lb

## 2018-06-23 DIAGNOSIS — H60332 Swimmer's ear, left ear: Secondary | ICD-10-CM | POA: Diagnosis not present

## 2018-06-23 MED ORDER — HYDROCODONE-ACETAMINOPHEN 5-325 MG PO TABS: 1.0000 | ORAL_TABLET | Freq: Four times a day (QID) | ORAL | 0 refills | Status: DC | PRN

## 2018-06-23 MED ORDER — AMOXICILLIN-POT CLAVULANATE 875-125 MG PO TABS: 1.0000 | ORAL_TABLET | Freq: Two times a day (BID) | ORAL | 0 refills | Status: DC

## 2018-06-23 MED ORDER — NEOMYCIN-POLYMYXIN-HC 3.5-10000-1 OT SOLN: 3.0000 [drp] | Freq: Four times a day (QID) | OTIC | 0 refills | Status: DC

## 2018-06-23 NOTE — Progress Notes (Signed)
   Subjective:    Patient ID: Jared Griffin, male    DOB: [birth year], 57 y.o.   MRN: [id number]  HPI Patient arrives with left ear pain since Saturday.   Tried some left over drops from before  Recalls no injury  No fever or chills  No cough or congestion  Patient does note he did some swimming last week.  Also has used his wife's medicine some.  Quite painful at times.  Patient does have diabetes type 2   Review of Systems No headache, no major weight loss or weight gain, no chest pain no back pain abdominal pain no change in bowel habits complete ROS otherwise negative     Objective:   Physical Exam Alert vitals stable, NAD. Blood pressure good on repeat. HEENT left external ear inflamed positive tenderness with ear motion otherwise normal. Lungs clear. Heart regular rate and rhythm.        Assessment & Plan:  Impression fairly severe external otitis plan oral antibiotics along with drops local measures discussed prevention discussed  Greater than 50% of this 15 minute face to face visit was spent in counseling and discussion and coordination of care regarding the above diagnosis/diagnosies

## 2018-07-08 NOTE — Telephone Encounter (Signed)
Discuss further at patient's scheduled appointment with Dr [name] 07/09/18.

## 2018-07-09 ENCOUNTER — Ambulatory Visit (INDEPENDENT_AMBULATORY_CARE_PROVIDER_SITE_OTHER): Payer: Worker's Compensation | Admitting: Orthopedic Surgery

## 2018-07-09 ENCOUNTER — Encounter: Payer: Self-pay | Admitting: Orthopedic Surgery

## 2018-07-09 VITALS — BP 126/80 | HR 81 | Ht 74.0 in | Wt 239.0 lb

## 2018-07-09 DIAGNOSIS — M7711 Lateral epicondylitis, right elbow: Secondary | ICD-10-CM

## 2018-07-09 NOTE — Addendum Note (Signed)
Addended byCandice Camp on: 07/09/2018 02:59 PM   Modules accepted: Orders

## 2018-07-09 NOTE — Patient Instructions (Signed)
OOW 3 MONTHS

## 2018-07-09 NOTE — Progress Notes (Signed)
Chief Complaint  Patient presents with  . Elbow Pain    has not had 2nd opinion IME yet     57 year old male had tennis elbow release November 2018 he has not made any significant improvement he says he has a constant ache on the lateral side of his elbow when he cannot lift anything of any substance  We had a second opinion set up it was canceled but not rescheduled he says the [employer] number that he calls and leaves messages but no one answers his call  He has painful extension of tremor when he reaches out he has tenderness over his lateral epicondyle he has normal flexion  Encounter Diagnosis  Name Primary?  . Lateral epicondylitis of right elbow s/p release 07/25/17 Yes    I am not sending him back to work until we get a second opinion regarding his elbow  I will let Worker's Comp. determine when they can get him a second opinion set up other than that I will see him in 3 months  He is not to work until released by me

## 2018-07-11 ENCOUNTER — Other Ambulatory Visit: Payer: Self-pay

## 2018-07-11 ENCOUNTER — Observation Stay (HOSPITAL_COMMUNITY)
Admission: EM | Admit: 2018-07-11 | Discharge: 2018-07-12 | Disposition: A | Discharge status: Home / Self Care | Payer: PRIVATE HEALTH INSURANCE | Attending: Family Medicine | Admitting: Family Medicine

## 2018-07-11 ENCOUNTER — Encounter (HOSPITAL_COMMUNITY): Payer: Self-pay

## 2018-07-11 DIAGNOSIS — R42 Dizziness and giddiness: Secondary | ICD-10-CM | POA: Diagnosis present

## 2018-07-11 DIAGNOSIS — R51 Headache: Secondary | ICD-10-CM | POA: Diagnosis not present

## 2018-07-11 DIAGNOSIS — R000 Tachycardia, unspecified: Secondary | ICD-10-CM | POA: Diagnosis not present

## 2018-07-11 DIAGNOSIS — E119 Type 2 diabetes mellitus without complications: Secondary | ICD-10-CM | POA: Insufficient documentation

## 2018-07-11 DIAGNOSIS — I10 Essential (primary) hypertension: Secondary | ICD-10-CM | POA: Diagnosis not present

## 2018-07-11 DIAGNOSIS — Z87891 Personal history of nicotine dependence: Secondary | ICD-10-CM | POA: Diagnosis not present

## 2018-07-11 DIAGNOSIS — R410 Disorientation, unspecified: Secondary | ICD-10-CM | POA: Diagnosis not present

## 2018-07-11 DIAGNOSIS — R5383 Other fatigue: Secondary | ICD-10-CM | POA: Diagnosis not present

## 2018-07-11 DIAGNOSIS — I2510 Atherosclerotic heart disease of native coronary artery without angina pectoris: Secondary | ICD-10-CM | POA: Diagnosis not present

## 2018-07-11 DIAGNOSIS — R519 Headache, unspecified: Secondary | ICD-10-CM

## 2018-07-11 DIAGNOSIS — R0602 Shortness of breath: Secondary | ICD-10-CM | POA: Diagnosis not present

## 2018-07-11 NOTE — Telephone Encounter (Signed)
I was asked by operator to call the patient's wife. She reports that he is feeling dizzy and nauseated and has a very bad headache. He is not having CP. He has HR 101, BP 130/90s. I discussed that it is difficult to determine what is causing his symptoms, and they were encouraged to go to the ED.

## 2018-07-11 NOTE — ED Triage Notes (Signed)
Pt has not felt well for 4-5 days, has had some dizziness, headache, heartrate elevated at home, mild chest pressure, nausea, no vomiting, diarrhea or fever.

## 2018-07-12 ENCOUNTER — Ambulatory Visit (HOSPITAL_COMMUNITY)
Admit: 2018-07-12 | Discharge: 2018-07-12 | Disposition: A | Discharge status: Home / Self Care | Payer: PRIVATE HEALTH INSURANCE | Attending: Internal Medicine | Admitting: Internal Medicine

## 2018-07-12 ENCOUNTER — Observation Stay (HOSPITAL_COMMUNITY): Payer: PRIVATE HEALTH INSURANCE

## 2018-07-12 ENCOUNTER — Emergency Department (HOSPITAL_COMMUNITY): Payer: PRIVATE HEALTH INSURANCE

## 2018-07-12 ENCOUNTER — Encounter (HOSPITAL_COMMUNITY): Payer: Self-pay | Admitting: Internal Medicine

## 2018-07-12 ENCOUNTER — Observation Stay (HOSPITAL_BASED_OUTPATIENT_CLINIC_OR_DEPARTMENT_OTHER): Payer: PRIVATE HEALTH INSURANCE

## 2018-07-12 DIAGNOSIS — R519 Headache, unspecified: Secondary | ICD-10-CM | POA: Diagnosis present

## 2018-07-12 DIAGNOSIS — R079 Chest pain, unspecified: Secondary | ICD-10-CM

## 2018-07-12 DIAGNOSIS — R42 Dizziness and giddiness: Secondary | ICD-10-CM | POA: Diagnosis not present

## 2018-07-12 DIAGNOSIS — R51 Headache: Secondary | ICD-10-CM | POA: Diagnosis not present

## 2018-07-12 LAB — CBC WITH DIFFERENTIAL/PLATELET
Abs Immature Granulocytes: 0.03 10*3/uL (ref 0.00–0.07)
Basophils Absolute: 0.1 10*3/uL (ref 0.0–0.1)
Basophils Relative: 1 %
Eosinophils Absolute: 0.1 10*3/uL (ref 0.0–0.5)
Eosinophils Relative: 2 %
HCT: 46.2 % (ref 39.0–52.0)
Hemoglobin: 15.3 g/dL (ref 13.0–17.0)
Immature Granulocytes: 0 %
Lymphocytes Relative: 40 %
Lymphs Abs: 3 10*3/uL (ref 0.7–4.0)
MCH: 28.3 pg (ref 26.0–34.0)
MCHC: 33.1 g/dL (ref 30.0–36.0)
MCV: 85.4 fL (ref 80.0–100.0)
Monocytes Absolute: 0.8 10*3/uL (ref 0.1–1.0)
Monocytes Relative: 11 %
Neutro Abs: 3.6 10*3/uL (ref 1.7–7.7)
Neutrophils Relative %: 46 %
Platelets: 174 10*3/uL (ref 150–400)
RBC: 5.41 MIL/uL (ref 4.22–5.81)
RDW: 13.4 % (ref 11.5–15.5)
WBC: 7.6 10*3/uL (ref 4.0–10.5)
nRBC: 0 % (ref 0.0–0.2)

## 2018-07-12 LAB — COMPREHENSIVE METABOLIC PANEL
ALT: 41 U/L (ref 0–44)
AST: 29 U/L (ref 15–41)
Albumin: 4.3 g/dL (ref 3.5–5.0)
Alkaline Phosphatase: 41 U/L (ref 38–126)
Anion gap: 14 (ref 5–15)
BUN: 20 mg/dL (ref 6–20)
CO2: 20 mmol/L — ABNORMAL LOW (ref 22–32)
Calcium: 9.7 mg/dL (ref 8.9–10.3)
Chloride: 102 mmol/L (ref 98–111)
Creatinine, Ser: 0.98 mg/dL (ref 0.61–1.24)
GFR calc Af Amer: >60 mL/min (ref >60)
GFR calc non Af Amer: >60 mL/min (ref >60)
Glucose, Bld: 225 mg/dL — ABNORMAL HIGH (ref 70–99)
Potassium: 4 mmol/L (ref 3.5–5.1)
Sodium: 136 mmol/L (ref 135–145)
Total Bilirubin: 1 mg/dL (ref 0.3–1.2)
Total Protein: 7.2 g/dL (ref 6.5–8.1)

## 2018-07-12 LAB — AMMONIA: Ammonia: 68 umol/L — ABNORMAL HIGH (ref 9–35)

## 2018-07-12 LAB — ECHOCARDIOGRAM COMPLETE
Height: 74 in
Weight: 3880 oz

## 2018-07-12 LAB — LIPID PANEL
Cholesterol: 172 mg/dL (ref 0–200)
HDL: 27 mg/dL — ABNORMAL LOW (ref >40)
LDL Cholesterol: UNDETERMINED mg/dL (ref 0–99)
Total CHOL/HDL Ratio: 6.4 RATIO
Triglycerides: 1157 mg/dL — ABNORMAL HIGH (ref <150)
VLDL: UNDETERMINED mg/dL (ref 0–40)

## 2018-07-12 LAB — RAPID URINE DRUG SCREEN, HOSP PERFORMED
Amphetamines: NOT DETECTED
Barbiturates: NOT DETECTED
Benzodiazepines: POSITIVE — AB
Cocaine: NOT DETECTED
Opiates: NOT DETECTED
Tetrahydrocannabinol: NOT DETECTED

## 2018-07-12 LAB — TSH: TSH: 0.525 u[IU]/mL (ref 0.350–4.500)

## 2018-07-12 LAB — GLUCOSE, CAPILLARY
Glucose-Capillary: 440 mg/dL — ABNORMAL HIGH (ref 70–99)
Glucose-Capillary: 316 mg/dL — ABNORMAL HIGH (ref 70–99)
Glucose-Capillary: 243 mg/dL — ABNORMAL HIGH (ref 70–99)

## 2018-07-12 LAB — TROPONIN I
Troponin I: <0.03 ng/mL (ref <0.03)
Troponin I: <0.03 ng/mL (ref <0.03)
Troponin I: <0.03 ng/mL (ref <0.03)

## 2018-07-12 LAB — HEMOGLOBIN A1C
Hgb A1c MFr Bld: 9 % — ABNORMAL HIGH (ref 4.8–5.6)
Mean Plasma Glucose: 211.6 mg/dL

## 2018-07-12 LAB — D-DIMER, QUANTITATIVE: D-Dimer, Quant: 0.38 ug/mL-FEU (ref 0.00–0.50)

## 2018-07-12 MED ORDER — LACTULOSE 10 GM/15ML PO SOLN
30.0000 g | Freq: Two times a day (BID) | ORAL | Status: DC
  Administered 2018-07-12: 30 g via ORAL
  Filled 2018-07-12: qty 60

## 2018-07-12 MED ORDER — ALPRAZOLAM 0.5 MG PO TABS
0.5000 mg | ORAL_TABLET | Freq: Three times a day (TID) | ORAL | Status: DC
  Administered 2018-07-12 (×2): 0.5 mg via ORAL
  Filled 2018-07-12 (×2): qty 1

## 2018-07-12 MED ORDER — INSULIN ASPART 100 UNIT/ML ~~LOC~~ SOLN
0.0000 [IU] | Freq: Three times a day (TID) | SUBCUTANEOUS | Status: DC
  Administered 2018-07-12: 20 [IU] via SUBCUTANEOUS
  Administered 2018-07-12: 15 [IU] via SUBCUTANEOUS
  Administered 2018-07-12: 7 [IU] via SUBCUTANEOUS

## 2018-07-12 MED ORDER — INSULIN ASPART 100 UNIT/ML ~~LOC~~ SOLN: 0.0000 [IU] | Freq: Every day | SUBCUTANEOUS | Status: DC

## 2018-07-12 MED ORDER — SERTRALINE HCL 50 MG PO TABS
100.0000 mg | ORAL_TABLET | Freq: Every day | ORAL | Status: DC
  Administered 2018-07-12: 100 mg via ORAL
  Filled 2018-07-12: qty 2

## 2018-07-12 MED ORDER — ASPIRIN-ACETAMINOPHEN-CAFFEINE 250-250-65 MG PO TABS
1.0000 | ORAL_TABLET | Freq: Four times a day (QID) | ORAL | Status: DC | PRN
  Filled 2018-07-12: qty 1

## 2018-07-12 MED ORDER — ONDANSETRON HCL 4 MG/2ML IJ SOLN
INTRAMUSCULAR | Status: AC
  Filled 2018-07-12: qty 2

## 2018-07-12 MED ORDER — LOSARTAN POTASSIUM 50 MG PO TABS
25.0000 mg | ORAL_TABLET | Freq: Every day | ORAL | Status: DC
  Administered 2018-07-12: 25 mg via ORAL
  Filled 2018-07-12: qty 1

## 2018-07-12 MED ORDER — GABAPENTIN 300 MG PO CAPS
600.0000 mg | ORAL_CAPSULE | Freq: Two times a day (BID) | ORAL | Status: DC
  Administered 2018-07-12: 600 mg via ORAL
  Filled 2018-07-12: qty 2

## 2018-07-12 MED ORDER — LORAZEPAM 2 MG/ML IJ SOLN
1.0000 mg | Freq: Once | INTRAMUSCULAR | Status: AC | PRN
  Administered 2018-07-12: 1 mg via INTRAVENOUS
  Filled 2018-07-12: qty 1

## 2018-07-12 MED ORDER — LACTATED RINGERS IV BOLUS
1000.0000 mL | Freq: Once | INTRAVENOUS | Status: AC
  Administered 2018-07-12: 1000 mL via INTRAVENOUS

## 2018-07-12 MED ORDER — INFLUENZA VAC SPLIT QUAD 0.5 ML IM SUSY: 0.5000 mL | PREFILLED_SYRINGE | INTRAMUSCULAR | Status: DC

## 2018-07-12 MED ORDER — GLIPIZIDE 5 MG PO TABS: 10.0000 mg | ORAL_TABLET | Freq: Two times a day (BID) | ORAL | Status: DC

## 2018-07-12 MED ORDER — DILTIAZEM HCL 30 MG PO TABS
30.0000 mg | ORAL_TABLET | Freq: Two times a day (BID) | ORAL | Status: DC
  Administered 2018-07-12: 30 mg via ORAL
  Filled 2018-07-12: qty 1

## 2018-07-12 MED ORDER — ASPIRIN-ACETAMINOPHEN-CAFFEINE 250-250-65 MG PO TABS: 1.0000 | ORAL_TABLET | Freq: Four times a day (QID) | ORAL | 0 refills | Status: AC | PRN

## 2018-07-12 MED ORDER — RANOLAZINE ER 500 MG PO TB12
500.0000 mg | ORAL_TABLET | Freq: Two times a day (BID) | ORAL | Status: DC
  Administered 2018-07-12: 500 mg via ORAL
  Filled 2018-07-12: qty 1

## 2018-07-12 MED ORDER — METFORMIN HCL 500 MG PO TABS: 1000.0000 mg | ORAL_TABLET | Freq: Two times a day (BID) | ORAL | Status: DC

## 2018-07-12 MED ORDER — INSULIN ASPART 100 UNIT/ML ~~LOC~~ SOLN: 0.0000 [IU] | Freq: Three times a day (TID) | SUBCUTANEOUS | Status: DC

## 2018-07-12 MED ORDER — ATORVASTATIN CALCIUM 40 MG PO TABS
40.0000 mg | ORAL_TABLET | Freq: Every day | ORAL | Status: DC
  Administered 2018-07-12: 40 mg via ORAL
  Filled 2018-07-12: qty 1

## 2018-07-12 MED ORDER — CANAGLIFLOZIN 300 MG PO TABS: 300.0000 mg | ORAL_TABLET | Freq: Every day | ORAL | Status: DC

## 2018-07-12 MED ORDER — INSULIN ASPART 100 UNIT/ML ~~LOC~~ SOLN
4.0000 [IU] | Freq: Three times a day (TID) | SUBCUTANEOUS | Status: DC
  Administered 2018-07-12 (×3): 4 [IU] via SUBCUTANEOUS

## 2018-07-12 MED ORDER — METOCLOPRAMIDE HCL 5 MG/ML IJ SOLN
10.0000 mg | Freq: Once | INTRAMUSCULAR | Status: AC
  Administered 2018-07-12: 10 mg via INTRAVENOUS
  Filled 2018-07-12: qty 2

## 2018-07-12 MED ORDER — ASPIRIN EC 81 MG PO TBEC
81.0000 mg | DELAYED_RELEASE_TABLET | Freq: Every day | ORAL | Status: DC
  Administered 2018-07-12: 81 mg via ORAL
  Filled 2018-07-12: qty 1

## 2018-07-12 MED ORDER — KETOROLAC TROMETHAMINE 30 MG/ML IJ SOLN
30.0000 mg | Freq: Three times a day (TID) | INTRAMUSCULAR | Status: DC | PRN
  Administered 2018-07-12: 30 mg via INTRAVENOUS
  Filled 2018-07-12: qty 1

## 2018-07-12 MED ORDER — METHYLPREDNISOLONE SODIUM SUCC 125 MG IJ SOLR
125.0000 mg | Freq: Once | INTRAMUSCULAR | Status: AC
  Administered 2018-07-12: 125 mg via INTRAVENOUS
  Filled 2018-07-12: qty 2

## 2018-07-12 MED ORDER — LACTULOSE 10 GM/15ML PO SOLN
30.0000 g | Freq: Once | ORAL | Status: AC
  Administered 2018-07-12: 30 g via ORAL
  Filled 2018-07-12: qty 60

## 2018-07-12 MED ORDER — ENOXAPARIN SODIUM 60 MG/0.6ML ~~LOC~~ SOLN
0.5000 mg/kg | SUBCUTANEOUS | Status: DC
  Administered 2018-07-12: 55 mg via SUBCUTANEOUS
  Filled 2018-07-12: qty 0.6

## 2018-07-12 MED ORDER — HYDROCODONE-ACETAMINOPHEN 5-325 MG PO TABS
1.0000 | ORAL_TABLET | Freq: Four times a day (QID) | ORAL | Status: DC | PRN
  Administered 2018-07-12: 1 via ORAL
  Filled 2018-07-12: qty 1

## 2018-07-12 MED ORDER — ALBUTEROL SULFATE (2.5 MG/3ML) 0.083% IN NEBU: 3.0000 mL | INHALATION_SOLUTION | Freq: Four times a day (QID) | RESPIRATORY_TRACT | Status: DC | PRN

## 2018-07-12 MED ORDER — DIPHENHYDRAMINE HCL 50 MG/ML IJ SOLN
25.0000 mg | Freq: Once | INTRAMUSCULAR | Status: AC
  Administered 2018-07-12: 25 mg via INTRAVENOUS
  Filled 2018-07-12: qty 1

## 2018-07-12 MED ORDER — ONDANSETRON HCL 4 MG/2ML IJ SOLN
4.0000 mg | Freq: Once | INTRAMUSCULAR | Status: AC
  Administered 2018-07-12: 4 mg via INTRAVENOUS

## 2018-07-12 MED ORDER — OMEGA-3-ACID ETHYL ESTERS 1 G PO CAPS
2.0000 | ORAL_CAPSULE | Freq: Two times a day (BID) | ORAL | Status: DC
  Administered 2018-07-12: 2 g via ORAL
  Filled 2018-07-12: qty 2

## 2018-07-12 MED ORDER — ACETAMINOPHEN 325 MG PO TABS: 650.0000 mg | ORAL_TABLET | Freq: Four times a day (QID) | ORAL | Status: DC | PRN

## 2018-07-12 MED ORDER — MECLIZINE HCL 12.5 MG PO TABS
25.0000 mg | ORAL_TABLET | Freq: Three times a day (TID) | ORAL | Status: DC | PRN
  Administered 2018-07-12: 25 mg via ORAL
  Filled 2018-07-12: qty 2

## 2018-07-12 MED ORDER — PANTOPRAZOLE SODIUM 40 MG PO TBEC
40.0000 mg | DELAYED_RELEASE_TABLET | Freq: Every day | ORAL | Status: DC
  Administered 2018-07-12: 40 mg via ORAL
  Filled 2018-07-12: qty 1

## 2018-07-12 NOTE — Progress Notes (Signed)
Pt's IV catheter removed and intact. Pt's IV site clean dry and intact. Discharge instructions including medications and follow up appointments were reviewed and discussed with patient. All questions were answered and no further questions at this time. Pt in stable condition and in no acute distress at time of discharge. Pt escorted by nurse tech

## 2018-07-12 NOTE — Progress Notes (Signed)
*  PRELIMINARY RESULTS* Echocardiogram 2D Echocardiogram has been performed.  Samuel Germany 07/12/2018, 12:58 PM

## 2018-07-12 NOTE — Discharge Instructions (Signed)
Seek medical care or return to the ER if symptoms come back, worsen or new problems develop. Please make sure and follow-up with your cardiologist. Please establish care with a neurologist regarding your headaches as soon as possible. Please establish care with a endocrinologist regarding diabetes management.   Migraine Headache A migraine headache is a very strong throbbing pain on one side or both sides of your head. Migraines can also cause other symptoms. Talk with your doctor about what things may bring on (trigger) your migraine headaches. Follow these instructions at home: Medicines  Take over-the-counter and prescription medicines only as told by your doctor.  Do not drive or use heavy machinery while taking prescription pain medicine.  To prevent or treat constipation while you are taking prescription pain medicine, your doctor may recommend that you: ? Drink enough fluid to keep your pee (urine) clear or pale yellow. ? Take over-the-counter or prescription medicines. ? Eat foods that are high in fiber. These include fresh fruits and vegetables, whole grains, and beans. ? Limit foods that are high in fat and processed sugars. These include fried and sweet foods. Lifestyle  Avoid alcohol.  Do not use any products that contain nicotine or tobacco, such as cigarettes and e-cigarettes. If you need help quitting, ask your doctor.  Get at least 8 hours of sleep every night.  Limit your stress. General instructions   Keep a journal to find out what may bring on your migraines. For example, write down: ? What you eat and drink. ? How much sleep you get. ? Any change in what you eat or drink. ? Any change in your medicines.  If you have a migraine: ? Avoid things that make your symptoms worse, such as bright lights. ? It may help to lie down in a dark, quiet room. ? Do not drive or use heavy machinery. ? Ask your doctor what activities are safe for you.  Keep all follow-up  visits as told by your doctor. This is important. Contact a doctor if:  You get a migraine that is different or worse than your usual migraines. Get help right away if:  Your migraine gets very bad.  You have a fever.  You have a stiff neck.  You have trouble seeing.  Your muscles feel weak or like you cannot control them.  You start to lose your balance a lot.  You start to have trouble walking.  You pass out (faint). This information is not intended to replace advice given to you by your health care provider. Make sure you discuss any questions you have with your health care provider. Document Released: 06/12/2008 Document Revised: 03/23/2016 Document Reviewed: 02/20/2016 Elsevier Interactive Patient Education  2018 [place] Headache Without Cause A headache is pain or discomfort felt around the head or neck area. There are many causes and types of headaches. In some cases, the cause may not be found. Follow these instructions at home: Managing pain  Take over-the-counter and prescription medicines only as told by your doctor.  Lie down in a dark, quiet room when you have a headache.  If directed, apply ice to the head and neck area: ? Put ice in a plastic bag. ? Place a towel between your skin and the bag. ? Leave the ice on for 20 minutes, 2-3 times per day.  Use a heating pad or hot shower to apply heat to the head and neck area as told by your doctor.  Keep lights dim if  bright lights bother you or make your headaches worse. Eating and drinking  Eat meals on a regular schedule.  Lessen how much alcohol you drink.  Lessen how much caffeine you drink, or stop drinking caffeine. General instructions  Keep all follow-up visits as told by your doctor. This is important.  Keep a journal to find out if certain things bring on headaches. For example, write down: ? What you eat and drink. ? How much sleep you get. ? Any change to your diet or  medicines.  Relax by getting a massage or doing other relaxing activities.  Lessen stress.  Sit up straight. Do not tighten (tense) your muscles.  Do not use tobacco products. This includes cigarettes, chewing tobacco, or e-cigarettes. If you need help quitting, ask your doctor.  Exercise regularly as told by your doctor.  Get enough sleep. This often means 7-9 hours of sleep. Contact a doctor if:  Your symptoms are not helped by medicine.  You have a headache that feels different than the other headaches.  You feel sick to your stomach (nauseous) or you throw up (vomit).  You have a fever. Get help right away if:  Your headache becomes really bad.  You keep throwing up.  You have a stiff neck.  You have trouble seeing.  You have trouble speaking.  You have pain in the eye or ear.  Your muscles are weak or you lose muscle control.  You lose your balance or have trouble walking.  You feel like you will pass out (faint) or you pass out.  You have confusion. This information is not intended to replace advice given to you by your health care provider. Make sure you discuss any questions you have with your health care provider. Document Released: 06/12/2008 Document Revised: 02/09/2016 Document Reviewed: 12/27/2014 Elsevier Interactive Patient Education  2018 [place].    Blood Glucose Monitoring, Adult Monitoring your blood sugar (glucose) helps you manage your diabetes. It also helps you and your health care provider determine how well your diabetes management plan is working. Blood glucose monitoring involves checking your blood glucose as often as directed, and keeping a record (log) of your results over time. Why should I monitor my blood glucose? Checking your blood glucose regularly can:  Help you understand how food, exercise, illnesses, and medicines affect your blood glucose.  Let you know what your blood glucose is at any time. You can quickly tell  if you are having low blood glucose (hypoglycemia) or high blood glucose (hyperglycemia).  Help you and your health care provider adjust your medicines as needed.  When should I check my blood glucose? Follow instructions from your health care provider about how often to check your blood glucose. This may depend on:  The type of diabetes you have.  How well-controlled your diabetes is.  Medicines you are taking.  If you have type 1 diabetes:  Check your blood glucose at least 2 times a day.  Also check your blood glucose: ? Before every insulin injection. ? Before and after exercise. ? Between meals. ? 2 hours after a meal. ? Occasionally between 2:00 a.m. and 3:00 a.m., as directed. ? Before potentially dangerous tasks, like driving or using heavy machinery. ? At bedtime.  You may need to check your blood glucose more often, up to 6-10 times a day: ? If you use an insulin pump. ? If you need multiple daily injections (MDI). ? If your diabetes is not well-controlled. ? If you  are ill. ? If you have a history of severe hypoglycemia. ? If you have a history of not knowing when your blood glucose is getting low (hypoglycemia unawareness). If you have type 2 diabetes:  If you take insulin or other diabetes medicines, check your blood glucose at least 2 times a day.  If you are on intensive insulin therapy, check your blood glucose at least 4 times a day. Occasionally, you may also need to check between 2:00 a.m. and 3:00 a.m., as directed.  Also check your blood glucose: ? Before and after exercise. ? Before potentially dangerous tasks, like driving or using heavy machinery.  You may need to check your blood glucose more often if: ? Your medicine is being adjusted. ? Your diabetes is not well-controlled. ? You are ill. What is a blood glucose log?  A blood glucose log is a record of your blood glucose readings. It helps you and your health care provider: ? Look for  patterns in your blood glucose over time. ? Adjust your diabetes management plan as needed.  Every time you check your blood glucose, write down your result and notes about things that may be affecting your blood glucose, such as your diet and exercise for the day.  Most glucose meters store a record of glucose readings in the meter. Some meters allow you to download your records to a computer. How do I check my blood glucose? Follow these steps to get accurate readings of your blood glucose: Supplies needed   Blood glucose meter.  Test strips for your meter. Each meter has its own strips. You must use the strips that come with your meter.  A needle to prick your finger (lancet). Do not use lancets more than once.  A device that holds the lancet (lancing device).  A journal or log book to write down your results. Procedure  Wash your hands with soap and water.  Prick the side of your finger (not the tip) with the lancet. Use a different finger each time.  Gently rub the finger until a small drop of blood appears.  Follow instructions that come with your meter for inserting the test strip, applying blood to the strip, and using your blood glucose meter.  Write down your result and any notes. Alternative testing sites  Some meters allow you to use areas of your body other than your finger (alternative sites) to test your blood.  If you think you may have hypoglycemia, or if you have hypoglycemia unawareness, do not use alternative sites. Use your finger instead.  Alternative sites may not be as accurate as the fingers, because blood flow is slower in these areas. This means that the result you get may be delayed, and it may be different from the result that you would get from your finger.  The most common alternative sites are: ? Forearm. ? Thigh. ? Palm of the hand. Additional tips  Always keep your supplies with you.  If you have questions or need help, all blood  glucose meters have a 24-hour hotline number that you can call. You may also contact your health care provider.  After you use a few boxes of test strips, adjust (calibrate) your blood glucose meter by following instructions that came with your meter. This information is not intended to replace advice given to you by your health care provider. Make sure you discuss any questions you have with your health care provider. Document Released: 09/06/2003 Document Revised: 03/23/2016 Document Reviewed:  02/13/2016 Elsevier Interactive Patient Education  2017 [place].    How to Avoid Diabetes Mellitus Problems You can take action to prevent or slow down problems that are caused by diabetes (diabetes mellitus). Following your diabetes plan and taking care of yourself can reduce your risk of serious or life-threatening complications. Manage your diabetes  Follow instructions from your health care providers about managing your diabetes. Your diabetes may be managed by a team of health care providers who can teach you how to care for yourself and can answer questions that you have.  Educate yourself about your condition so you can make healthy choices about eating and physical activity.  Check your blood sugar (glucose) levels as often as directed. Your health care provider will help you decide how often to check your blood glucose level depending on your treatment goals and how well you are meeting them.  Ask your health care provider if you should take low-dose aspirin daily and what dose is recommended for you. Taking low-dose aspirin daily is recommended to help prevent cardiovascular disease. Do not use nicotine or tobacco Do not use any products that contain nicotine or tobacco, such as cigarettes and e-cigarettes. If you need help quitting, ask your health care provider. Nicotine raises your risk for diabetes problems. If you quit using nicotine:  You will lower your risk for heart attack,  stroke, nerve disease, and kidney disease.  Your cholesterol and blood pressure may improve.  Your blood circulation will improve.  Keep your blood pressure under control To control your blood pressure:  Follow instructions from your health care provider about meal planning, exercise, and medicines.  Make sure your health care provider checks your blood pressure at every medical visit.  A blood pressure reading consists of two numbers. Generally, the goal is to keep your top number (systolic pressure) at or below 130, and your bottom number (diastolic pressure) at or below 80. Your health care provider may recommend a lower target blood pressure. Your individualized target blood pressure is determined based on:  Your age.  Your medicines.  How long you have had diabetes.  Any other medical conditions you have.  Keep your cholesterol under control To control your cholesterol:  Follow instructions from your health care provider about meal planning, exercise, and medicines.  Have your cholesterol checked at least once a year.  You may be prescribed medicine to lower cholesterol (statin). If you are not taking a statin, ask your health care provider if you should be.  Controlling your cholesterol may:  Help prevent heart disease and stroke. These are the most common health problems for people with diabetes.  Improve your blood flow.  Schedule and keep yearly physical exams and eye exams Your health care provider will tell you how often you need medical visits depending on your diabetes management plan. Keep all follow-up visits as directed. This is important so possible problems can be identified early and complications can be avoided or treated.  Every visit with your health care provider should include measuring your: ? Weight. ? Blood pressure. ? Blood glucose control.  Your A1c (hemoglobin A1c) level should be checked: ? At least 2 times a year, if you are meeting your  treatment goals. ? 4 times a year, if you are not meeting treatment goals or if your treatment goals have changed.  Your blood lipids (lipid profile) should be checked yearly. You should also be checked yearly for protein in your urine (urine microalbumin).  If  you have type 1 diabetes, get an eye exam 3-5 years after you are diagnosed, and then once a year after your first exam.  If you have type 2 diabetes, get an eye exam as soon as you are diagnosed, and then once a year after your first exam.  Keep your vaccines current It is recommended that you receive:  A flu (influenza) vaccine every year.  A pneumonia (pneumococcal) vaccine and a hepatitis B vaccine. If you are age 64 or older, you may get the pneumonia vaccine as a series of two separate shots.  Ask your health care provider which other vaccines may be recommended. Take care of your feet Diabetes may cause you to have poor blood circulation to your legs and feet. Because of this, taking care of your feet is very important. Diabetes can cause:  The skin on the feet to get thinner, break more easily, and heal more slowly.  Nerve damage in your legs and feet, which results in decreased feeling. You may not notice minor injuries that could lead to serious problems.  To avoid foot problems:  Check your skin and feet every day for cuts, bruises, redness, blisters, or sores.  Schedule a foot exam with your health care provider once every year. This exam includes: ? Inspecting of the structure and skin of your feet. ? Checking the pulses and sensation in your feet.  Make sure that your health care provider performs a visual foot exam at every medical visit.  Take care of your teeth People with poorly controlled diabetes are more likely to have gum (periodontal) disease. Diabetes can make periodontal diseases harder to control. If not treated, periodontal diseases can lead to tooth loss. To prevent this:  Brush your teeth twice  a day.  Floss at least once a day.  Visit your dentist 2 times a year.  Drink responsibly Limit alcohol intake to no more than 1 drink a day for nonpregnant women and 2 drinks a day for men. One drink equals 12 oz of beer, 5 oz of wine, or 1 oz of hard liquor. It is important to eat food when you drink alcohol to avoid low blood glucose (hypoglycemia). Avoid alcohol if you:  Have a history of alcohol abuse or dependence.  Are pregnant.  Have liver disease, pancreatitis, advanced neuropathy, or severe hypertriglyceridemia.  Lessen stress Living with diabetes can be stressful. When you are experiencing stress, your blood glucose may be affected in two ways:  Stress hormones may cause your blood glucose to rise.  You may be distracted from taking good care of yourself.  Be aware of your stress level and make changes to help you manage challenging situations. To lower your stress levels:  Consider joining a support group.  Do planned relaxation or meditation.  Do a hobby that you enjoy.  Maintain healthy relationships.  Exercise regularly.  Work with your health care provider or a mental health professional.  Summary  You can take action to prevent or slow down problems that are caused by diabetes (diabetes mellitus). Following your diabetes plan and taking care of yourself can reduce your risk of serious or life-threatening complications.  Follow instructions from your health care providers about managing your diabetes. Your diabetes may be managed by a team of health care providers who can teach you how to care for yourself and can answer questions that you have.  Your health care provider will tell you how often you need medical visits depending on your  diabetes management plan. Keep all follow-up visits as directed. This is important so possible problems can be identified early and complications can be avoided or treated. This information is not intended to replace advice  given to you by your health care provider. Make sure you discuss any questions you have with your health care provider. Document Released: 05/22/2011 Document Revised: 06/02/2016 Document Reviewed: 06/02/2016 Elsevier Interactive Patient Education  2018 [place].  Insulin Treatment for Diabetes Diabetes (diabetes mellitus) is a long-term (chronic) disease. It occurs when the body does not properly use sugar (glucose) that is released from food after digestion. Glucose levels are controlled by a hormone called insulin, which is made in the pancreas.  If you have type 1 diabetes, the pancreas does not make any insulin, so you must take insulin.  If you have type 2 diabetes, you might need to take insulin along with other medicines. In type 2 diabetes, one or both of these problems may be present: ? The pancreas does not make enough insulin. ? Cells in the body do not respond properly to insulin that the body makes (insulin resistance).  You must use insulin correctly to control your diabetes. You must have some insulin in your body at all times. Insulin treatment varies depending on your type of diabetes, your treatment goals, and your medical history. It is important for you to understand your insulin treatment plan so you can be an active partner in managing your diabetes. How is insulin given? Insulin can only be given through a shot (injection). It is injected using a syringe and needle, an insulin pen, a pump, or a jet injector. Your health care provider will:  Prescribe the amount and type of insulin that you need.  Tell you when you should inject your insulin.  Where on the body should insulin be injected? Insulin is injected into a layer of fatty tissue under the skin. Good places to inject insulin include:  Abdomen. Generally, the abdomen is the best place to inject insulin. However, you should avoid any area that is less than 2 inches (5 cm) from the belly button  (navel).  Front and outer area of the upper thighs.  The back of the upper arms.  Upper buttocks.  It is important to:  Give your injection in a slightly different place each time. This helps to prevent irritation and improve absorption.  Avoid injecting into areas that have scar tissue.  Usually, you will give yourself insulin injections. Others can also be taught how to give you injections. You will use a special type of syringe that is made only for insulin. Some people may have an insulin pump that delivers insulin steadily through a tube (cannula) that is placed under the skin. What are the different types of insulin? The following information is a general guide to different types of insulin. Specifics vary depending on the insulin product that your health care provider prescribes.  Rapid-acting insulin: ? Starts working quickly, in as little as 5 minutes. ? Can last for 4-6 hours, or sometimes longer. ? Works well when taken right before a meal to quickly lower blood glucose.  Short-acting insulin: ? Starts working in about 30 minutes. ? Can last for 6-10 hours. ? Should be taken about 30 minutes before you start eating a meal.  Intermediate-acting insulin: ? Starts working in 1-2 hours. ? Lasts for about 10-18 hours. ? Lowers your blood glucose for a longer period of time but is not as effective for  lowering blood glucose right after a meal.  Long-acting insulin: ? Mimics the small amount of insulin that your pancreas usually produces throughout the day. ? Should be used either one or two times a day. ? Is usually used in combination with other types of insulin or other medicines.  Concentrated insulin, or U-500 insulin: ? Contains a higher dose of insulin than most rapid-acting insulins. U-500 insulin has 5 times the amount of insulin per 1 mL. ? Should only be used with the special U-500 syringe or U-500 insulin pen. It is dangerous to use the wrong type of syringe  with this insulin.  What are the side effects of insulin? Possible side effects of insulin treatment include:  Low blood glucose (hypoglycemia).  Weight gain.  High blood glucose (hyperglycemia).  Skin injury or irritation.  Some of these side effects can be caused by using improper injection technique. It is important to learn to inject insulin properly. What are common terms associated with insulin treatment? Some terms that you might hear include:  Basal insulin, or basal rate. This is the constant amount of insulin that needs to be present in your body to stabilize your blood glucose levels. People who have type 1 diabetes need basal insulin in a steady (continuous) dose 24 hours a day. ? Usually, intermediate-acting or long-acting insulin is used one or two times a day to manage basal insulin levels. ? Medicines that are taken by mouth may also be recommended to manage basal insulin levels.  Prandial insulin. This refers to meal-related insulin. ? Blood glucose rises quickly after a meal (postprandial). Rapid-acting or short-acting insulin can be used right before a meal (preprandial) to quickly lower blood glucose. ? You may be instructed to adjust the amount of prandial insulin that you take depending on how much carbohydrate (starch) is in your meal.  Corrective insulin. This may also be called a correction dose or supplemental dose. This is a small amount of rapid-acting or short-acting insulin that can be used to lower blood glucose if it is too high. You may be instructed to check your blood glucose at certain times of the day and use corrective insulin as needed.  Tight control, or intensive therapy. This means keeping your blood glucose as close to your target as possible, and preventing it from getting too high after meals. People who have tight control of their diabetes have fewer long-term problems caused by diabetes.  General instructions  Talk with your health care  provider or pharmacist about the type of insulin you should take and when you should take it. You should know when your insulin peaks and when it wears off. You need this information so you can plan your meals and exercise. You also need to work with your health care provider to:  Check your blood glucose every day. Your health care provider will tell you how often and when you should do this.  Manage your: ? Weight. ? Blood pressure. ? Cholesterol. ? Stress.  Eat a healthy diet.  Exercise regularly.  This information is not intended to replace advice given to you by your health care provider. Make sure you discuss any questions you have with your health care provider. Document Released: 11/30/2008 Document Revised: 02/09/2016 Document Reviewed: 10/07/2015 Elsevier Interactive Patient Education  [employer].

## 2018-07-12 NOTE — Discharge Summary (Signed)
Physician Discharge Summary  Jared Griffin GUY:[id number] DOB: 1961-04-20 DOA: 07/11/2018  PCP: Mikey Kirschner, MD  Admit date: 07/11/2018 Discharge date: 07/12/2018  Admitted From: Home  Disposition: Home  Recommendations for Outpatient Follow-up:  1. Follow up with PCP in 2 weeks 2. Establish with endocrinology regarding uncontrolled diabetes mellitus 3. Follow up with cardiologist in 3 weeks 4. Establish care with neurologist in 2 weeks regarding migraine headaches  Discharge Condition: STABLE   CODE STATUS: FULL    Brief Hospitalization Summary: Please see all hospital notes, images, labs for full details of the hospitalization.  HPI:   Jared Griffin  is a 57 y.o. male, w CAD s/p DES 2015, Hypertension, Dm2, NASH, apparently c/o headache, and  dizziness/ vertigo at 5 pm while [place] the mall.  He also noted some slight chest tightness without radiation subsequently and presented to ED due the concern about headache, vertigo and blurred vision.   In Ed,  Pt is chest pain free. Records reviewed Pt had cardiac catheterization on 02/26/2018   Ost 2nd Diag to 2nd Diag lesion is 25% stenosed.  Prior LAD stent is widely patent with minimal restenosis.  Mid LAD-2 lesion is 25% stenosed.  Mid RCA lesion is 10% stenosed.  The left ventricular systolic function is normal.  LV end diastolic pressure is normal.  The left ventricular ejection fraction is 55-65% by visual estimate.  There is no aortic valve stenosis.   T afebrile   P 92  Bp 134/84  Pox 96% on RA  Wbc 7.6, Hgb 15.3, Plt 174  Na 136, K 4.0, Bun 20, Creatinine 0.98 Hco3 =20  Glucose 225  Trop <0.03  Ammonia 68  Pt was admitted for vertigo, blurred vision, headache, and  hyperammonemia  The patient was admitted for an evaluation regarding his generalized symptoms of dizziness, headache.  He had serial troponins tested which were negative.  He was monitored on cardiac telemetry for  the entire time with no acute findings.  His EKG did not show acute findings.  He had a 2D echocardiogram with no significant wall abnormalities and his ejection fraction was 60%.  His carotid Doppler studies were without significant hemodynamic stenosis please see below.  The patient had an MRI/MRA of the brain that did not reveal any acute findings.  His diabetes mellitus was noted to be out of control and his diet was noted to be out of control.  His hemoglobin A1c was 9.0% which is evidence of poor control.  I spoke to him about following up with an endocrinologist to establish care to help him with his diabetes management.  He needs to be on insulin at this point.  He wants to discuss with his primary care physician.  In addition, I think that his headaches are likely triggered by stress and poorly controlled diabetes mellitus as evidenced by his hyperglycemia and dyslipidemia.  I explained to him that his lipids will not improve until he has much better glycemic control.  He verbalized understanding.  I asked for him to follow-up with his cardiologist in 2 to 3 weeks for recheck.  He verbalized understanding.  I also spoke to his wife who verbalized understanding.  His headache, blurred vision and vertigo had resolved prior to discharge.  He was also noted to have a mildly elevated ammonia level but I suspect this is secondary to his chronic liver disease.  He reports that he does have fatty liver disease.  That is another reason that  he must get his diabetes under better control as his triglycerides are greater than 1100 after meals and he is at high risk for acute pancreatitis.  We did check a lipid panel this morning thinking that he was in a fasting state however with the triglycerides being so elevated I further question the patient and found that he had eaten to biscuit sandwiches in the middle of the night.  I asked him to please follow-up with his PCP to discuss further management.  The patient  verbalized understanding.   Echocardiogram 07/12/18 Study Conclusions  - Left ventricle: The cavity size was normal. Wall thickness was   increased in a pattern of moderate LVH. Systolic function was   normal. The estimated ejection fraction was in the range of 60%   to 65%. Wall motion was normal; there were no regional wall   motion abnormalities. The study is not technically sufficient to   allow evaluation of LV diastolic function. - Aortic valve: Valve area (VTI): 4.17 cm^2. Valve area (Vmax):   5.11 cm^2. Valve area (Vmean): 5.16 cm^2. - Atrial septum: No defect or patent foramen ovale was identified.  ------------------------------------------------------------------- Labs, prior tests, procedures, and surgery: CAD S/P LAD DES March 2015.  ------------------------------------------------------------------- Study data:  Comparison was made to the study of 09/21/2015.  Study status:  Routine.  Procedure:  The patient reported no pain pre or post test. Transthoracic echocardiography. Image quality was adequate.  Study completion:  There were no complications. Transthoracic echocardiography.  M-mode, complete 2D, spectral Doppler, and color Doppler.  Birthdate:  Patient birthdate: [birth year].  Age:  Patient is 57 yr old.  Sex:  Gender: male. BMI: 31.1 kg/m^2.  Blood pressure:     124/84  Patient status: Inpatient.  Study date:  Study date: 07/12/2018. Study time: 12:17 PM.  Location:  Bedside.   Carotid Ultrasound 07/12/18 IMPRESSION: Mild amount of calcified plaque at the level of both carotid bulbs and proximal internal carotid arteries. Estimated bilateral ICA stenoses are less than 50%.  Electronically Signed   By: Aletta Edouard M.D.   On: 07/12/2018 14:16   Discharge Diagnoses:  Active Problems:   Dizziness   Nonintractable headache    Discharge Instructions: Discharge Instructions    Call MD for:  extreme fatigue   Complete by:  As directed     Call MD for:  persistant dizziness or light-headedness   Complete by:  As directed    Call MD for:  persistant nausea and vomiting   Complete by:  As directed    Call MD for:  severe uncontrolled pain   Complete by:  As directed    Increase activity slowly   Complete by:  As directed      Allergies as of 07/12/2018      Reactions   Metoprolol Rash      Medication List    STOP taking these medications   acetaminophen 500 MG tablet Commonly known as:  TYLENOL   amoxicillin-clavulanate 875-125 MG tablet Commonly known as:  AUGMENTIN   neomycin-polymyxin-hydrocortisone OTIC solution Commonly known as:  CORTISPORIN     TAKE these medications   ACCU-CHEK AVIVA PLUS test strip Generic drug:  glucose blood CHECK BLOOD SUGAR UP TO FOUR TIMES A DAY.   albuterol 108 (90 Base) MCG/ACT inhaler Commonly known as:  PROVENTIL HFA;VENTOLIN HFA Inhale 2 puffs into the lungs every 6 (six) hours as needed for wheezing or shortness of breath.   ALPRAZolam 0.5 MG tablet Commonly known as:  XANAX TAKE (1) TABLET BY MOUTH (3) TIMES DAILY.   ASPIR-LOW 81 MG EC tablet Generic drug:  aspirin Take 1 tablet (81 mg total) by mouth daily.   aspirin-acetaminophen-caffeine 250-250-65 MG tablet Commonly known as:  EXCEDRIN MIGRAINE Take 1 tablet by mouth every 6 (six) hours as needed for up to 2 days for headache or migraine.   atorvastatin 40 MG tablet Commonly known as:  LIPITOR TAKE ONE (1) TABLET BY MOUTH EVERY DAY   diltiazem 30 MG tablet Commonly known as:  CARDIZEM Take 1 tablet (30 mg total) by mouth 2 (two) times daily.   gabapentin 300 MG capsule Commonly known as:  NEURONTIN Take 600 mg by mouth 2 (two) times daily.   glipiZIDE 5 MG tablet Commonly known as:  GLUCOTROL Take 2 tablets (10 mg total) by mouth 2 (two) times daily.   HYDROcodone-acetaminophen 5-325 MG tablet Commonly known as:  NORCO/VICODIN Take 1 tablet by mouth every 6 (six) hours as needed for moderate  pain.   Icosapent Ethyl 1 g Caps Take 2 capsules (2 g total) by mouth 2 (two) times daily.   INVOKANA 300 MG Tabs tablet Generic drug:  canagliflozin TAKE ONE TABLET BY MOUTH ONCE DAILY WITH BREAKFAST.   losartan 25 MG tablet Commonly known as:  COZAAR TAKE ONE (1) TABLET BY MOUTH EVERY DAY   meclizine 25 MG tablet Commonly known as:  ANTIVERT Take 1 tablet (25 mg total) by mouth 3 (three) times daily as needed for dizziness.   metFORMIN 500 MG tablet Commonly known as:  GLUCOPHAGE Take 2 tablets (1,000 mg total) by mouth 2 (two) times daily with a meal. What changed:    how much to take  when to take this  additional instructions   nitroGLYCERIN 0.4 MG SL tablet Commonly known as:  NITROSTAT Place 1 tablet (0.4 mg total) under the tongue every 5 (five) minutes as needed for chest pain.   pantoprazole 40 MG tablet Commonly known as:  PROTONIX TAKE (1) TABLET BY MOUTH ONCE DAILY.   ranolazine 500 MG 12 hr tablet Commonly known as:  RANEXA TAKE (1) TABLET BY MOUTH TWICE DAILY.   sertraline 100 MG tablet Commonly known as:  ZOLOFT Take 1 tablet (100 mg total) by mouth daily.      Follow-up Information    Mikey Kirschner, MD. Schedule an appointment as soon as possible for a visit in 2 week(s).   Specialty:  Family Medicine Contact information: [place] [address] [phone]        Herminio Commons, MD. Schedule an appointment as soon as possible for a visit in 3 week(s).   Specialty:  Cardiology Contact information: [place] [address] [phone]        Cassandria Anger, MD. Schedule an appointment as soon as possible for a visit in 2 week(s).   Specialty:  Endocrinology Why:  Call for help with diabetes management Contact information: [place] [address] [phone]        Phillips Odor, MD. Schedule an appointment as soon as possible for a visit in 2 week(s).    Specialty:  Neurology Why:  Establish Care for migraine headaches Contact information: [address] [place] [address] [phone]          Allergies  Allergen Reactions  . Metoprolol Rash   Allergies as of 07/12/2018      Reactions   Metoprolol Rash  Medication List    STOP taking these medications   acetaminophen 500 MG tablet Commonly known as:  TYLENOL   amoxicillin-clavulanate 875-125 MG tablet Commonly known as:  AUGMENTIN   neomycin-polymyxin-hydrocortisone OTIC solution Commonly known as:  CORTISPORIN     TAKE these medications   ACCU-CHEK AVIVA PLUS test strip Generic drug:  glucose blood CHECK BLOOD SUGAR UP TO FOUR TIMES A DAY.   albuterol 108 (90 Base) MCG/ACT inhaler Commonly known as:  PROVENTIL HFA;VENTOLIN HFA Inhale 2 puffs into the lungs every 6 (six) hours as needed for wheezing or shortness of breath.   ALPRAZolam 0.5 MG tablet Commonly known as:  XANAX TAKE (1) TABLET BY MOUTH (3) TIMES DAILY.   ASPIR-LOW 81 MG EC tablet Generic drug:  aspirin Take 1 tablet (81 mg total) by mouth daily.   aspirin-acetaminophen-caffeine 250-250-65 MG tablet Commonly known as:  EXCEDRIN MIGRAINE Take 1 tablet by mouth every 6 (six) hours as needed for up to 2 days for headache or migraine.   atorvastatin 40 MG tablet Commonly known as:  LIPITOR TAKE ONE (1) TABLET BY MOUTH EVERY DAY   diltiazem 30 MG tablet Commonly known as:  CARDIZEM Take 1 tablet (30 mg total) by mouth 2 (two) times daily.   gabapentin 300 MG capsule Commonly known as:  NEURONTIN Take 600 mg by mouth 2 (two) times daily.   glipiZIDE 5 MG tablet Commonly known as:  GLUCOTROL Take 2 tablets (10 mg total) by mouth 2 (two) times daily.   HYDROcodone-acetaminophen 5-325 MG tablet Commonly known as:  NORCO/VICODIN Take 1 tablet by mouth every 6 (six) hours as needed for moderate pain.   Icosapent Ethyl 1 g Caps Take 2 capsules (2 g total) by mouth 2 (two)  times daily.   INVOKANA 300 MG Tabs tablet Generic drug:  canagliflozin TAKE ONE TABLET BY MOUTH ONCE DAILY WITH BREAKFAST.   losartan 25 MG tablet Commonly known as:  COZAAR TAKE ONE (1) TABLET BY MOUTH EVERY DAY   meclizine 25 MG tablet Commonly known as:  ANTIVERT Take 1 tablet (25 mg total) by mouth 3 (three) times daily as needed for dizziness.   metFORMIN 500 MG tablet Commonly known as:  GLUCOPHAGE Take 2 tablets (1,000 mg total) by mouth 2 (two) times daily with a meal. What changed:    how much to take  when to take this  additional instructions   nitroGLYCERIN 0.4 MG SL tablet Commonly known as:  NITROSTAT Place 1 tablet (0.4 mg total) under the tongue every 5 (five) minutes as needed for chest pain.   pantoprazole 40 MG tablet Commonly known as:  PROTONIX TAKE (1) TABLET BY MOUTH ONCE DAILY.   ranolazine 500 MG 12 hr tablet Commonly known as:  RANEXA TAKE (1) TABLET BY MOUTH TWICE DAILY.   sertraline 100 MG tablet Commonly known as:  ZOLOFT Take 1 tablet (100 mg total) by mouth daily.       Procedures/Studies: Dg Chest 2 View  Result Date: 07/12/2018 CLINICAL DATA:  57 year old male with shortness of breath and syncope. EXAM: CHEST - 2 VIEW COMPARISON:  Chest radiograph dated 04/22/2018 FINDINGS: The heart size and mediastinal contours are within normal limits. Both lungs are clear. The visualized skeletal structures are unremarkable. IMPRESSION: No active cardiopulmonary disease. Electronically Signed   By: Anner Crete M.D.   On: 07/12/2018 00:42   Ct Head Wo Contrast  Result Date: 07/12/2018 CLINICAL DATA:  Feeling unwell for 5 days. Dizziness, headache, tachycardia. History of  hypertension, hyperlipidemia and diabetes. EXAM: CT HEAD WITHOUT CONTRAST TECHNIQUE: Contiguous axial images were obtained from the base of the skull through the vertex without intravenous contrast. COMPARISON:  CT HEAD December 03, 2005 FINDINGS: BRAIN: No intraparenchymal  hemorrhage, mass effect nor midline shift. The ventricles and sulci are normal for age. No acute large vascular territory infarcts. No abnormal extra-axial fluid collections. Basal cisterns are patent. VASCULAR: Mild calcific atherosclerosis carotid siphon. SKULL/SOFT TISSUES: No skull fracture. No significant soft tissue swelling. ORBITS/SINUSES: The included ocular globes and orbital contents are normal.Trace paranasal sinus mucosal thickening. Mastoid air cells are well aerated. OTHER: None. IMPRESSION: Negative non-contrast CT HEAD for age. Electronically Signed   By: Elon Alas M.D.   On: 07/12/2018 00:51   Mr Virgel Paling ZO Contrast  Result Date: 07/12/2018 CLINICAL DATA:  57 year old male with dizziness and headache. Vertigo. EXAM: MRI HEAD WITHOUT CONTRAST MRA HEAD WITHOUT CONTRAST TECHNIQUE: Multiplanar, multiecho pulse sequences of the brain and surrounding structures were obtained without intravenous contrast. Angiographic images of the head were obtained using MRA technique without contrast. COMPARISON:  Head CT without contrast 0037 hours today. Cervical spine MRI 10/25/2010. FINDINGS: MRI HEAD FINDINGS Brain: No restricted diffusion to suggest acute infarction. No midline shift, mass effect, evidence of mass lesion, ventriculomegaly, extra-axial collection or acute intracranial hemorrhage. Cervicomedullary junction and pituitary are within normal limits. Pearline Cables and white matter signal is within normal limits for age throughout the brain. No cortical encephalomalacia or chronic cerebral blood products. Deep gray matter nuclei, brainstem, and cerebellum are within normal limits. Vascular: Major intracranial vascular flow voids are preserved. Skull and upper cervical spine: Negative visible cervical spine. Normal bone marrow signal. Sinuses/Orbits: Negative. Other: Visible internal auditory structures appear normal. Trace mastoid air cell fluid. Scalp and face soft tissues appear negative. MRA HEAD  FINDINGS The entire V4 segments were not included but the distal vertebral arteries and vertebrobasilar junction appear normal. Distal bilateral PICA flow signal is visible. The basilar artery is patent without stenosis. Normal SCA and PCA origins. Posterior communicating arteries are diminutive or absent. Bilateral PCA branches appear normal. Antegrade flow in both ICA siphons. No siphon stenosis. Normal ophthalmic artery origins. Normal carotid termini, MCA and ACA origins. Anterior communicating artery is normal. Median artery of the corpus callosum is present (normal variant) simulating azygos ACA anatomy. ACA branches are within normal limits. Bilateral MCA M1 segments, MCA bifurcations, and visible bilateral MCA branches appear normal. IMPRESSION: 1.  No acute intracranial abnormality. 2. Normal for age noncontrast MRI appearance of the brain. 3.  Negative intracranial MRA. Electronically Signed   By: Genevie Ann M.D.   On: 07/12/2018 11:40   Mr Brain Wo Contrast  Result Date: 07/12/2018 CLINICAL DATA:  57 year old male with dizziness and headache. Vertigo. EXAM: MRI HEAD WITHOUT CONTRAST MRA HEAD WITHOUT CONTRAST TECHNIQUE: Multiplanar, multiecho pulse sequences of the brain and surrounding structures were obtained without intravenous contrast. Angiographic images of the head were obtained using MRA technique without contrast. COMPARISON:  Head CT without contrast 0037 hours today. Cervical spine MRI 10/25/2010. FINDINGS: MRI HEAD FINDINGS Brain: No restricted diffusion to suggest acute infarction. No midline shift, mass effect, evidence of mass lesion, ventriculomegaly, extra-axial collection or acute intracranial hemorrhage. Cervicomedullary junction and pituitary are within normal limits. Pearline Cables and white matter signal is within normal limits for age throughout the brain. No cortical encephalomalacia or chronic cerebral blood products. Deep gray matter nuclei, brainstem, and cerebellum are within normal  limits. Vascular: Major intracranial vascular flow  voids are preserved. Skull and upper cervical spine: Negative visible cervical spine. Normal bone marrow signal. Sinuses/Orbits: Negative. Other: Visible internal auditory structures appear normal. Trace mastoid air cell fluid. Scalp and face soft tissues appear negative. MRA HEAD FINDINGS The entire V4 segments were not included but the distal vertebral arteries and vertebrobasilar junction appear normal. Distal bilateral PICA flow signal is visible. The basilar artery is patent without stenosis. Normal SCA and PCA origins. Posterior communicating arteries are diminutive or absent. Bilateral PCA branches appear normal. Antegrade flow in both ICA siphons. No siphon stenosis. Normal ophthalmic artery origins. Normal carotid termini, MCA and ACA origins. Anterior communicating artery is normal. Median artery of the corpus callosum is present (normal variant) simulating azygos ACA anatomy. ACA branches are within normal limits. Bilateral MCA M1 segments, MCA bifurcations, and visible bilateral MCA branches appear normal. IMPRESSION: 1.  No acute intracranial abnormality. 2. Normal for age noncontrast MRI appearance of the brain. 3.  Negative intracranial MRA. Electronically Signed   By: Genevie Ann M.D.   On: 07/12/2018 11:40   US Carotid Bilateral  Result Date: 07/12/2018 CLINICAL DATA:  Dizziness, hypertension, hyperlipidemia, diabetes and obesity. EXAM: BILATERAL CAROTID DUPLEX ULTRASOUND TECHNIQUE: Pearline Cables scale imaging, color Doppler and duplex ultrasound were performed of bilateral carotid and vertebral arteries in the neck. COMPARISON:  None. FINDINGS: Criteria: Quantification of carotid stenosis is based on velocity parameters that correlate the residual internal carotid diameter with NASCET-based stenosis levels, using the diameter of the distal internal carotid lumen as the denominator for stenosis measurement. The following velocity measurements were obtained:  RIGHT ICA:  77/25 cm/sec CCA:  379/02 cm/sec SYSTOLIC ICA/CCA RATIO:  0.7 ECA:  98 cm/sec LEFT ICA:  91/31 cm/sec CCA:  409/73 cm/sec SYSTOLIC ICA/CCA RATIO:  0.9 ECA:  160 cm/sec RIGHT CAROTID ARTERY: There is a mild amount of partially calcified plaque at the level of the carotid bulb and proximal right ICA. Velocities and waveforms are normal and estimated right ICA stenosis is less than 50%. RIGHT VERTEBRAL ARTERY: Antegrade flow with normal waveform and velocity. LEFT CAROTID ARTERY: There is a mild amount of predominately calcified plaque at the level of the carotid bulb and proximal left ICA. Velocities and waveforms are normal and estimated left ICA stenosis is less than 50%. LEFT VERTEBRAL ARTERY: Antegrade flow with normal waveform and velocity. IMPRESSION: Mild amount of calcified plaque at the level of both carotid bulbs and proximal internal carotid arteries. Estimated bilateral ICA stenoses are less than 50%. Electronically Signed   By: Aletta Edouard M.D.   On: 07/12/2018 14:16      Subjective: Pt feeling much better, headache better, no chest pain, no nausea, no emesis, no abdominal pain. No SOB.   Discharge Exam: Vitals:   07/12/18 0319 07/12/18 1203  BP:  124/84  Pulse:  (!) 120  Resp:  18  Temp: 98.4 F (36.9 C)   SpO2: 94% 94%   Vitals:   07/12/18 0319 07/12/18 0332 07/12/18 0500 07/12/18 1203  BP:    124/84  Pulse:    (!) 120  Resp:    18  Temp: 98.4 F (36.9 C)     TempSrc: Oral     SpO2: 94%   94%  Weight:  110 kg 110 kg   Height:  6' 2"  (1.88 m)      General: Pt is alert, awake, not in acute distress Cardiovascular: RRR, S1/S2 +, no rubs, no gallops Respiratory: CTA bilaterally, no wheezing, no rhonchi Abdominal: Soft,  NT, ND, bowel sounds + Extremities: no edema, no cyanosis Neurological: nonfocal   The results of significant diagnostics from this hospitalization (including imaging, microbiology, ancillary and laboratory) are listed below for reference.      Microbiology: No results found for this or any previous visit (from the past 240 hour(s)).   Labs: BNP (last 3 results) No results for input(s): BNP in the last 8760 hours. Basic Metabolic Panel: Recent Labs  Lab 07/12/18 0025  NA 136  K 4.0  CL 102  CO2 20*  GLUCOSE 225*  BUN 20  CREATININE 0.98  CALCIUM 9.7   Liver Function Tests: Recent Labs  Lab 07/12/18 0025  AST 29  ALT 41  ALKPHOS 41  BILITOT 1.0  PROT 7.2  ALBUMIN 4.3   No results for input(s): LIPASE, AMYLASE in the last 168 hours. Recent Labs  Lab 07/12/18 0027  AMMONIA 68*   CBC: Recent Labs  Lab 07/12/18 0025  WBC 7.6  NEUTROABS 3.6  HGB 15.3  HCT 46.2  MCV 85.4  PLT 174   Cardiac Enzymes: Recent Labs  Lab 07/12/18 0025 07/12/18 0713 07/12/18 1411  TROPONINI <0.03 <0.03 <0.03   BNP: Invalid input(s): POCBNP CBG: Recent Labs  Lab 07/12/18 0741 07/12/18 1206 07/12/18 1633  GLUCAP 440* 316* 243*   D-Dimer Recent Labs    07/12/18 0025  DDIMER 0.38   Hgb A1c Recent Labs    07/12/18 0713  HGBA1C 9.0*   Lipid Profile Recent Labs    07/12/18 0713  CHOL 172  HDL 27*  LDLCALC UNABLE TO CALCULATE IF TRIGLYCERIDE OVER 400 mg/dL  TRIG 1,157*  CHOLHDL 6.4   Thyroid function studies Recent Labs    07/12/18 0713  TSH 0.525   Anemia work up No results for input(s): VITAMINB12, FOLATE, FERRITIN, TIBC, IRON, RETICCTPCT in the last 72 hours. Urinalysis    Component Value Date/Time   COLORURINE YELLOW 09/11/2016 0216   APPEARANCEUR CLEAR 09/11/2016 0216   LABSPEC 1.024 09/11/2016 0216   PHURINE 5.0 09/11/2016 0216   GLUCOSEU >=500 (A) 09/11/2016 0216   HGBUR NEGATIVE 09/11/2016 0216   BILIRUBINUR NEGATIVE 09/11/2016 0216   KETONESUR 20 (A) 09/11/2016 0216   PROTEINUR NEGATIVE 09/11/2016 0216   NITRITE NEGATIVE 09/11/2016 0216   LEUKOCYTESUR NEGATIVE 09/11/2016 0216   Sepsis Labs Invalid input(s): PROCALCITONIN,  WBC,  LACTICIDVEN Microbiology No results  found for this or any previous visit (from the past 240 hour(s)).  Time coordinating discharge:   SIGNED:  Irwin Brakeman, MD  [facility] 07/12/2018, 4:40 PM Pager [phone]  If 7PM-7AM, please contact night-coverage [email or web address] Password [email or web address]

## 2018-07-12 NOTE — Progress Notes (Addendum)
07/11/2018 11:20 PM  07/12/2018 8:31 AM  [name] was seen and examined.  The H&P by the admitting provider, orders, imaging was reviewed.  Please see new orders.  Will continue to follow.   I evaluated patient and his neuro exam has improved, no more facial droop.  He is ambulating.  He still has a severe headache.  He does need an MRI.  We are not able to get an MRI / MRA at this hospital for the next 2 days.  I called and made arrangements for patient to transfer to [facility] radiology to have his MRI / MRA brain done and to return to [facility] after the procedure.  I called [facility] and made transportation arrangements and completed a medical necessity form.  I updated RN that patient needs to be medicated prior to procedure and to saline lock IV.    Murvin Natal, MD [facility]

## 2018-07-12 NOTE — Progress Notes (Signed)
[facility] in to transport patient to [facility] for MRI.  Ativan given prior to procedure.

## 2018-07-12 NOTE — ED Provider Notes (Signed)
Emergency Department Provider Note   I have reviewed the triage vital signs and the nursing notes.   HISTORY  Chief Complaint Dizziness and Tachycardia   HPI Jared Griffin is a 57 y.o. male with multiple medical problems as documented below the presents to the emergency department with multiple complaints.  Sound that the patient had about a couple weeks of symptoms of intermittently worsening headaches and cavities in his entire head.  Also with elevated blood sugars, a weird feeling of feeling like he is floating.  Today again all got worse when he had significant head pressure with blurry vision and abnormal ambulation.  He subsequently had some chest pressure with shortness of breath as well that is since resolved.  During this time is not had any fever but does have a decreased appetite.  No vomiting but has had loose stools for the last couple weeks.  No new rashes.  Was treated for a ear infection a couple weeks ago with no recurrent issues.  Has not noticed any lower extremity swelling no abdominal swelling or distention.  At the current time he is still having a slight headache and not feeling well.  Distant history of smoking and has been told he is close to having COPD.  Compliant with medications. No other associated or modifying symptoms.    Past Medical History:  Diagnosis Date  . Anxiety   . 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 Coronary atherosclerosis of native coronary artery    a. 12/09/2013: s/p PCI with 3.0 x 28 Promus DES to mLAD  . Cyst (solitary) of breast 10/03/2017   removed from back  . Depression   . Elbow injury 07/25/2017   surgery for torn tendon  . Essential hypertension, benign   . Fatty liver disease, nonalcoholic   . Lateral epicondylitis of right elbow   . Mixed hyperlipidemia 2006  . Obesity   . Osgood-Schlatter's disease of right knee   . Type 2 diabetes mellitus [facility])     Patient Active Problem List   Diagnosis  Date Noted  . Dizziness 07/12/2018  . Abnormal nuclear stress test   . Aftercare following surgery 07/25/17 08/13/2017  . Hx of colonic polyps 08/07/2017  . Lateral epicondylitis of right elbow   . Cervical nerve root impingement 12/09/2015  . Generalized anxiety disorder 09/27/2015  . Chest pain 09/20/2015  . Pain in the chest   . Depression 01/24/2015  . Esophageal reflux 01/24/2015  . Preoperative cardiovascular examination 06/28/2014  . Type 2 diabetes mellitus ([place])   . Mixed hyperlipidemia   . Cirrhosis of liver without mention of alcohol   . Fatty liver disease, nonalcoholic   . Obesity   . Intermediate coronary syndrome ([place]) 12/09/2013  . Fatty liver 05/05/2013  . Lumbago 05/21/2012  . Essential hypertension, benign 12/28/2009  . CAD S/P LAD DES March 2015 12/28/2009  . Hyperlipidemia LDL goal <70 11/25/2009  . Accelerating angina ([employer]) 11/25/2009  . DM 11/24/2009  . ABDOMINAL PAIN, HX OF 11/24/2009    Past Surgical History:  Procedure Laterality Date  . CARDIAC CATHETERIZATION  2011  . CARDIAC CATHETERIZATION N/A 09/20/2015   Procedure: Left Heart Cath and Coronary Angiography;  Surgeon: [name], MD;  Location: [place] CV LAB;  Service: Cardiovascular;  Laterality: N/A;  . CHOLECYSTECTOMY  2003  . COLONOSCOPY  06/19/2012   Procedure: COLONOSCOPY;  Surgeon: [name], MD;  Location: [facility];  Service: Endoscopy;  Laterality:  N/A;  930  . COLONOSCOPY N/A 10/17/2017   Procedure: COLONOSCOPY;  Surgeon: [name], MD;  Location: [facility];  Service: Endoscopy;  Laterality: N/A;  1225  . KNEE ARTHROPLASTY Right 1999  . LEFT HEART CATH AND CORONARY ANGIOGRAPHY N/A 02/26/2018   Procedure: LEFT HEART CATH AND CORONARY ANGIOGRAPHY;  Surgeon: [name], MD;  Location: [place] CV LAB;  Service: Cardiovascular;  Laterality: N/A;  . LEFT HEART CATHETERIZATION WITH CORONARY ANGIOGRAM N/A 12/09/2013   Procedure: LEFT HEART  CATHETERIZATION WITH CORONARY ANGIOGRAM;  Surgeon: [name], MD;  Location: [facility] CATH LAB;  Service: Cardiovascular;  Laterality: N/A;  . Liver biopsy    . POLYPECTOMY  10/17/2017   Procedure: POLYPECTOMY;  Surgeon: [name], MD;  Location: [facility];  Service: Endoscopy;;  ascending colon, splenic flexure,sigmoid x2  . TENNIS ELBOW RELEASE/NIRSCHEL PROCEDURE Right 07/25/2017   Procedure: TENNIS ELBOW RELEASE and debriedment;  Surgeon: [name], MD;  Location: [facility] ORS;  Service: Orthopedics;  Laterality: Right;  . TONSILLECTOMY  1970's      Allergies Metoprolol  Family History  Problem Relation Age of Onset  . Cancer Maternal Aunt   . CVA Maternal Grandmother   . CVA Maternal Grandfather     Social History Social History   Tobacco Use  . Smoking status: Former Smoker    Packs/day: 0.50    Years: 10.00    Pack years: 5.00    Types: Cigarettes    Start date: 02/02/1973    Last attempt to quit: 09/17/1983    Years since quitting: 34.8  . Smokeless tobacco: Former Systems developer    Types: [place] date: 09/17/1983  Substance Use Topics  . Alcohol use: No    Alcohol/week: 0.0 standard drinks    Comment: 11-30-15 per pt no  . Drug use: No    Comment: 11-30-15 per pt no    Review of Systems  All other systems negative except as documented in the HPI. All pertinent positives and negatives as reviewed in the HPI. ____________________________________________   PHYSICAL EXAM:  VITAL SIGNS: ED Triage Vitals  Enc Vitals Group     BP 07/11/18 2330 127/89     Pulse Rate 07/11/18 2330 95     Resp 07/11/18 2330 (!) 21     Temp --      Temp src --      SpO2 07/11/18 2330 97 %     Weight 07/11/18 2327 235 lb (106.6 kg)     Height 07/11/18 2327 6' 2"  (1.88 m)    Constitutional: Alert and oriented. Well appearing and in no acute distress. Eyes: Conjunctivae are normal. PERRL. EOMI. Head: Atraumatic. Nose: No congestion/rhinnorhea. Mouth/Throat:  Mucous membranes are moist.  Oropharynx non-erythematous. Neck: No stridor.  No meningeal signs.   Cardiovascular: Normal rate, regular rhythm. Good peripheral circulation. Grossly normal heart sounds.   Respiratory: Normal respiratory effort.  No retractions. Lungs CTAB. Gastrointestinal: Soft and nontender. No distention.  Musculoskeletal: No lower extremity tenderness nor edema. No gross deformities of extremities. Neurologic:  Normal speech and language. Flattened right nasolabial fold and abnormal smile. Improves intermittently with active use. Normal gross sensation to ble and bue. Forehead rise symmetric. Uvula symmetric. Normal sensation to upper/mid/lower face symmetrically.  Skin:  Skin is warm, dry and intact. Multiple scars and cysts but No rash noted. ____________________________________________   LABS (all labs ordered are listed, but only abnormal results are displayed)  Labs Reviewed  COMPREHENSIVE METABOLIC PANEL - Abnormal; Notable for the following components:      Result Value   CO2 20 (*)    Glucose, Bld 225 (*)    All other components within normal limits  AMMONIA - Abnormal; Notable for the following components:   Ammonia 68 (*)    All other components within normal limits  CBC WITH DIFFERENTIAL/PLATELET  D-DIMER, QUANTITATIVE (NOT AT [facility])  TROPONIN I  HIV ANTIBODY (ROUTINE TESTING W REFLEX)  RAPID URINE DRUG SCREEN, HOSP PERFORMED  TSH  TROPONIN I  TROPONIN I  TROPONIN I   ____________________________________________  EKG   EKG Interpretation  Date/Time:  Friday July 11 2018 23:25:55 EDT Ventricular Rate:  96 PR Interval:    QRS Duration: 83 QT Interval:  344 QTC Calculation: 435 R Axis:   47 Text Interpretation:  Sinus rhythm Low voltage, precordial leads no significant change since 2017 Confirmed by Merrily Pew [phone]) on 07/11/2018 11:29:18 PM       ____________________________________________  RADIOLOGY  Dg Chest 2 View  Result  Date: 07/12/2018 CLINICAL DATA:  57 year old male with shortness of breath and syncope. EXAM: CHEST - 2 VIEW COMPARISON:  Chest radiograph dated 04/22/2018 FINDINGS: The heart size and mediastinal contours are within normal limits. Both lungs are clear. The visualized skeletal structures are unremarkable. IMPRESSION: No active cardiopulmonary disease. Electronically Signed   By: Anner Crete M.D.   On: 07/12/2018 00:42   Ct Head Wo Contrast  Result Date: 07/12/2018 CLINICAL DATA:  Feeling unwell for 5 days. Dizziness, headache, tachycardia. History of hypertension, hyperlipidemia and diabetes. EXAM: CT HEAD WITHOUT CONTRAST TECHNIQUE: Contiguous axial images were obtained from the base of the skull through the vertex without intravenous contrast. COMPARISON:  CT HEAD December 03, 2005 FINDINGS: BRAIN: No intraparenchymal hemorrhage, mass effect nor midline shift. The ventricles and sulci are normal for age. No acute large vascular territory infarcts. No abnormal extra-axial fluid collections. Basal cisterns are patent. VASCULAR: Mild calcific atherosclerosis carotid siphon. SKULL/SOFT TISSUES: No skull fracture. No significant soft tissue swelling. ORBITS/SINUSES: The included ocular globes and orbital contents are normal.Trace paranasal sinus mucosal thickening. Mastoid air cells are well aerated. OTHER: None. IMPRESSION: Negative non-contrast CT HEAD for age. Electronically Signed   By: Elon Alas M.D.   On: 07/12/2018 00:51    ____________________________________________   PROCEDURES  Procedure(s) performed:   Procedures   ____________________________________________   INITIAL IMPRESSION / ASSESSMENT AND PLAN / ED COURSE  Unclear etiology for patient's symptoms.  He does have a history of diabetes and has been hyperglycemic recently could be related to that.  He also has a history of coronary artery disease will need to rule out ACS.  Also could be pulmonary embolus secondary to  his tachycardia, tachypnea, hypoxia and the recent chest pressure so we will get a d-dimer to evaluate for that.  With the severe headaches and history of smoking could also consider some type of malignant process we will get a CT of his head especially with his abnormal cranial nerve findings that could be related however these seem to be somewhat variable during exam.  Workup as above.  Slightly elevated ammonia which could be related to his symptoms.  However with his neurologic findings and headache probably needs an MRI to evaluate for acute stroke.  Also secondary to his chest pressure, shortness of breath and this history of coronary artery disease and is ruled out from an ACS standpoint.  Discussed with Dr. Maudie Mercury will observe for same.  Pertinent labs & imaging results that were available during my care of the patient were reviewed by me and considered in my medical decision making (see chart for details).  ____________________________________________  FINAL CLINICAL IMPRESSION(S) / ED DIAGNOSES  Final diagnoses:  Confusion  Other fatigue  Nonintractable headache, unspecified chronicity pattern, unspecified headache type     MEDICATIONS GIVEN DURING THIS VISIT:  Medications  ondansetron (ZOFRAN) 4 MG/2ML injection (  Canceled Entry 07/12/18 0149)  enoxaparin (LOVENOX) injection 55 mg (55 mg Subcutaneous Given 07/12/18 0428)  aspirin EC tablet 81 mg (has no administration in time range)  atorvastatin (LIPITOR) tablet 40 mg (has no administration in time range)  diltiazem (CARDIZEM) tablet 30 mg (has no administration in time range)  HYDROcodone-acetaminophen (NORCO/VICODIN) 5-325 MG per tablet 1 tablet (1 tablet Oral Given 07/12/18 0435)  Icosapent Ethyl CAPS 2 g (has no administration in time range)  losartan (COZAAR) tablet 25 mg (has no administration in time range)  ranolazine (RANEXA) 12 hr tablet 500 mg (has no administration in time range)  ALPRAZolam (XANAX) tablet 0.5 mg  (has no administration in time range)  sertraline (ZOLOFT) tablet 100 mg (has no administration in time range)  glipiZIDE (GLUCOTROL) tablet 10 mg (has no administration in time range)  canagliflozin (INVOKANA) tablet 300 mg (has no administration in time range)  meclizine (ANTIVERT) tablet 25 mg (25 mg Oral Given 07/12/18 0435)  pantoprazole (PROTONIX) EC tablet 40 mg (has no administration in time range)  gabapentin (NEURONTIN) capsule 600 mg (has no administration in time range)  albuterol (PROVENTIL) (2.5 MG/3ML) 0.083% nebulizer solution 3 mL (has no administration in time range)  insulin aspart (novoLOG) injection 0-9 Units (has no administration in time range)  insulin aspart (novoLOG) injection 0-5 Units (has no administration in time range)  Influenza vac split quadrivalent PF (FLUARIX) injection 0.5 mL (has no administration in time range)  lactated ringers bolus 1,000 mL (0 mLs Intravenous Stopped 07/12/18 0149)  ondansetron (ZOFRAN) injection 4 mg (4 mg Intravenous Given 07/12/18 0141)  lactulose (CHRONULAC) 10 GM/15ML solution 30 g (30 g Oral Given 07/12/18 0223)  metoCLOPramide (REGLAN) injection 10 mg (10 mg Intravenous Given 07/12/18 0222)  diphenhydrAMINE (BENADRYL) injection 25 mg (25 mg Intravenous Given 07/12/18 0222)  lactated ringers bolus 1,000 mL (1,000 mLs Intravenous New Bag/Given 07/12/18 0223)  methylPREDNISolone sodium succinate (SOLU-MEDROL) 125 mg/2 mL injection 125 mg (125 mg Intravenous Given 07/12/18 0223)     NEW OUTPATIENT MEDICATIONS STARTED DURING THIS VISIT:  Current Discharge Medication List      Note:  This note was prepared with assistance of Dragon voice recognition software. Occasional wrong-word or sound-a-like substitutions may have occurred due to the inherent limitations of voice recognition software.   Loni Delbridge, Corene Cornea, MD 07/12/18 [phone]

## 2018-07-12 NOTE — H&P (Signed)
TRH H&P   Patient Demographics:    Jared Griffin, is a 57 y.o. male  MRN: [id number]   DOB - 1961/01/18  Admit Date - 07/11/2018  Outpatient Primary MD for the patient is Wolfgang Phoenix Grace Bushy, MD  Referring MD/NP/PA:  Dorise Bullion  Outpatient Specialists:      Patient coming from: home  Chief Complaint  Patient presents with  . Dizziness  . Tachycardia      HPI:    Jared Griffin  is a 57 y.o. male, w CAD s/p DES 2015, Hypertension, Dm2, NASH, apparently c/o headache, and  dizziness/ vertigo at 5 pm while he [place] at the mall.  He also noted some slight chest tightness without radiation subsequently and presented to ED due the concern about headache, vertigo and blurred vision.   In Ed,  Pt is chest pain free. Records reviewed Pt had cardiac catheterization on 02/26/2018   Ost 2nd Diag to 2nd Diag lesion is 25% stenosed.  Prior LAD stent is widely patent with minimal restenosis.  Mid LAD-2 lesion is 25% stenosed.  Mid RCA lesion is 10% stenosed.  The left ventricular systolic function is normal.  LV end diastolic pressure is normal.  The left ventricular ejection fraction is 55-65% by visual estimate.  There is no aortic valve stenosis.   T afebrile   P 92  Bp 134/84  Pox 96% on RA  Wbc 7.6, Hgb 15.3, Plt 174  Na 136, K 4.0, Bun 20, Creatinine 0.98 Hco3 =20  Glucose 225  Trop <0.03  Ammonia 68  Pt will be admitted for vertigo, blurred vision, headache, and  hyperammonemia       Review of systems:    In addition to the HPI above,  No Fever-chills,   No problems swallowing food or Liquids, No Chest pain, Cough or Shortness of Breath, No Abdominal pain, No Nausea or Vommitting, Bowel movements are regular, No Blood in stool or Urine, No dysuria, No new skin rashes or bruises, No new joints pains-aches,  No new weakness, tingling, numbness  in any extremity, No recent weight gain or loss, No polyuria, polydypsia or polyphagia, No significant Mental Stressors.  A full 10 point Review of Systems was done, except as stated above, all other Review of Systems were negative.   With Past History of the following :    Past Medical History:  Diagnosis Date  . Anxiety   . 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 Coronary atherosclerosis of native coronary artery    a. 12/09/2013: s/p PCI with 3.0 x 28 Promus DES to mLAD  . Cyst (solitary) of breast 10/03/2017   removed from back  . Depression   . Elbow injury 07/25/2017   surgery for torn tendon  . Essential hypertension, benign   . Fatty liver disease, nonalcoholic   . Lateral epicondylitis of right elbow   .  Mixed hyperlipidemia 2006  . Obesity   . Osgood-Schlatter's disease of right knee   . Type 2 diabetes mellitus ([place])       Past Surgical History:  Procedure Laterality Date  . CARDIAC CATHETERIZATION  2011  . CARDIAC CATHETERIZATION N/A 09/20/2015   Procedure: Left Heart Cath and Coronary Angiography;  Surgeon: [name], MD;  Location: [facility];  Service: Cardiovascular;  Laterality: N/A;  . CHOLECYSTECTOMY  2003  . COLONOSCOPY  06/19/2012   Procedure: COLONOSCOPY;  Surgeon: [name], MD;  [facility]: [facility];  Service: Endoscopy;  Laterality: N/A;  930  . COLONOSCOPY N/A 10/17/2017   Procedure: COLONOSCOPY;  Surgeon: [name], MD;  Location: [facility];  Service: Endoscopy;  Laterality: N/A;  1225  . KNEE ARTHROPLASTY Right 1999  . LEFT HEART CATH AND CORONARY ANGIOGRAPHY N/A 02/26/2018   Procedure: LEFT HEART CATH AND CORONARY ANGIOGRAPHY;  Surgeon: [name], MD;  Location: [facility];  Service: Cardiovascular;  Laterality: N/A;  . LEFT HEART CATHETERIZATION WITH CORONARY ANGIOGRAM N/A 12/09/2013   Procedure: LEFT HEART CATHETERIZATION WITH CORONARY ANGIOGRAM;  Surgeon: [name], MD;  Location: [facility];  Service: Cardiovascular;  Laterality: N/A;  . Liver biopsy    . POLYPECTOMY  10/17/2017   Procedure: POLYPECTOMY;  Surgeon: [name], MD;  [facility]: [facility];  Service: Endoscopy;;  ascending colon, splenic flexure,sigmoid x2  . TENNIS ELBOW RELEASE/NIRSCHEL PROCEDURE Right 07/25/2017   Procedure: TENNIS ELBOW RELEASE and debriedment;  Surgeon: [name], MD;  Location: [facility];  Service: Orthopedics;  Laterality: Right;  . TONSILLECTOMY  1970's      Social History:     Social History   Tobacco Use  . Smoking status: Former Smoker    Packs/day: 0.50    Years: 10.00    Pack years: 5.00    Types: Cigarettes    Start date: 02/02/1973    Last attempt to quit: 09/17/1983    Years since quitting: 34.8  . Smokeless tobacco: Former Systems developer    Types: [place] date: 09/17/1983  Substance Use Topics  . Alcohol use: No    Alcohol/week: 0.0 standard drinks    Comment: 11-30-15 per pt no     Lives - at home with wife  Mobility - walks by self   Family History :     Family History  Problem Relation Age of Onset  . Cancer Maternal Aunt   . CVA Maternal Grandmother   . CVA Maternal Grandfather        Home Medications:   Prior to Admission medications   Medication Sig Start Date End Date Taking? Authorizing Provider  ACCU-CHEK AVIVA PLUS test strip CHECK BLOOD SUGAR UP TO FOUR TIMES A DAY. 09/18/16   Mikey Kirschner, MD  acetaminophen (TYLENOL) 500 MG tablet Take 1,000 mg by mouth daily as needed for moderate pain or headache.     [provider]  albuterol (PROVENTIL HFA;VENTOLIN HFA) 108 (90 Base) MCG/ACT inhaler Inhale 2 puffs into the lungs every 6 (six) hours as needed for wheezing or shortness of breath. 04/25/18   Mikey Kirschner, MD  ALPRAZolam Duanne Moron) 0.5 MG tablet TAKE (1) TABLET BY MOUTH (3) TIMES DAILY. 05/20/18   Cloria Spring, MD  amoxicillin-clavulanate (AUGMENTIN) 875-125 MG tablet Take 1 tablet  by mouth 2 (two) times daily. 06/23/18   Mikey Kirschner, MD  aspirin (ASPIR-LOW) 81  MG EC tablet Take 1 tablet (81 mg total) by mouth daily. 10/24/17   Rehman, Mechele Dawley, MD  atorvastatin (LIPITOR) 40 MG tablet TAKE ONE (1) TABLET BY MOUTH EVERY DAY 05/27/18   Herminio Commons, MD  diltiazem (CARDIZEM) 30 MG tablet Take 1 tablet (30 mg total) by mouth 2 (two) times daily. 12/02/17   Mikey Kirschner, MD  gabapentin (NEURONTIN) 300 MG capsule Take 600 mg by mouth 2 (two) times daily.     [provider]  glipiZIDE (GLUCOTROL) 5 MG tablet Take 2 tablets (10 mg total) by mouth 2 (two) times daily. 12/02/17   Mikey Kirschner, MD  HYDROcodone-acetaminophen (NORCO) 5-325 MG tablet Take 1 tablet by mouth every 6 (six) hours as needed for moderate pain. 06/23/18   Mikey Kirschner, MD  Icosapent Ethyl (VASCEPA) 1 g CAPS Take 2 capsules (2 g total) by mouth 2 (two) times daily. 02/17/18   Herminio Commons, MD  INVOKANA 300 MG TABS tablet TAKE ONE TABLET BY MOUTH ONCE DAILY WITH BREAKFAST. 01/27/18   Mikey Kirschner, MD  losartan (COZAAR) 25 MG tablet TAKE ONE (1) TABLET BY MOUTH EVERY DAY 06/10/18   Herminio Commons, MD  meclizine (ANTIVERT) 25 MG tablet Take 1 tablet (25 mg total) by mouth 3 (three) times daily as needed for dizziness. 04/25/18   Mikey Kirschner, MD  metFORMIN (GLUCOPHAGE) 500 MG tablet Take 1-2 tablets (500-1,000 mg total) by mouth See admin instructions. TAKE 1000 MG BY MOUTH EVERY MORNING, THEN 500 MG BY MOUTH AT NOON, THEN 1000 MG BY MOUTH EVERY EVENING 02/28/18   [name], MD  neomycin-polymyxin-hydrocortisone (CORTISPORIN) OTIC solution Place 3-4 drops into the left ear 4 (four) times daily. 06/23/18   Mikey Kirschner, MD  nitroGLYCERIN (NITROSTAT) 0.4 MG SL tablet Place 1 tablet (0.4 mg total) under the tongue every 5 (five) minutes as needed for chest pain. 03/26/16   Herminio Commons, MD  pantoprazole (PROTONIX) 40 MG tablet TAKE (1) TABLET BY MOUTH  ONCE DAILY. 05/12/18   Mikey Kirschner, MD  ranolazine (RANEXA) 500 MG 12 hr tablet TAKE (1) TABLET BY MOUTH TWICE DAILY. 03/25/18   Herminio Commons, MD  sertraline (ZOLOFT) 100 MG tablet Take 1 tablet (100 mg total) by mouth daily. 05/20/18 05/20/19  Cloria Spring, MD     Allergies:     Allergies  Allergen Reactions  . Metoprolol Rash     Physical Exam:   Vitals  Blood pressure 134/84, pulse 92, resp. rate 18, height 6' 2"  (1.88 m), weight 106.6 kg, SpO2 96 %.   1. General  lying in bed in NAD,   2. Normal affect and insight, Not Suicidal or Homicidal, Awake Alert, Oriented X 3.  3. No F.N deficits, ALL C.Nerves Intact, Strength 5/5 all 4 extremities, Sensation intact all 4 extremities, Plantars down going.  4. Ears and Eyes appear Normal, Conjunctivae clear, PERRLA. Moist Oral Mucosa.  5. Supple Neck, No JVD, No cervical lymphadenopathy appriciated, No Carotid Bruits.  6. Symmetrical Chest wall movement, Good air movement bilaterally, CTAB.  7. RRR, No Gallops, Rubs or Murmurs, No Parasternal Heave.  8. Positive Bowel Sounds, Abdomen Soft, No tenderness, No organomegaly appriciated,No rebound -guarding or rigidity.  9.  No Cyanosis, Normal Skin Turgor, No Skin Rash or Bruise.  10. Good muscle tone,  joints appear normal , no effusions, Normal ROM.  11. No Palpable Lymph Nodes in Neck or Axillae  Good finger to  nose,    Data Review:    CBC Recent Labs  Lab 07/12/18 0025  WBC 7.6  HGB 15.3  HCT 46.2  PLT 174  MCV 85.4  MCH 28.3  MCHC 33.1  RDW 13.4  LYMPHSABS 3.0  MONOABS 0.8  EOSABS 0.1  BASOSABS 0.1   ------------------------------------------------------------------------------------------------------------------  Chemistries  Recent Labs  Lab 07/12/18 0025  NA 136  K 4.0  CL 102  CO2 20*  GLUCOSE 225*  BUN 20  CREATININE 0.98  CALCIUM 9.7  AST 29  ALT 41  ALKPHOS 41  BILITOT 1.0    ------------------------------------------------------------------------------------------------------------------ estimated creatinine clearance is 109.5 mL/min (by C-G formula based on SCr of 0.98 mg/dL). ------------------------------------------------------------------------------------------------------------------ No results for input(s): TSH, T4TOTAL, T3FREE, THYROIDAB in the last 72 hours.  Invalid input(s): FREET3  Coagulation profile No results for input(s): INR, PROTIME in the last 168 hours. ------------------------------------------------------------------------------------------------------------------- Recent Labs    07/12/18 0025  DDIMER 0.38   -------------------------------------------------------------------------------------------------------------------  Cardiac Enzymes Recent Labs  Lab 07/12/18 0025  TROPONINI <0.03   ------------------------------------------------------------------------------------------------------------------ No results found for: BNP   ---------------------------------------------------------------------------------------------------------------  Urinalysis    Component Value Date/Time   COLORURINE YELLOW 09/11/2016 0216   APPEARANCEUR CLEAR 09/11/2016 0216   LABSPEC 1.024 09/11/2016 0216   PHURINE 5.0 09/11/2016 0216   GLUCOSEU >=500 (A) 09/11/2016 0216   HGBUR NEGATIVE 09/11/2016 0216   BILIRUBINUR NEGATIVE 09/11/2016 0216   KETONESUR 20 (A) 09/11/2016 0216   PROTEINUR NEGATIVE 09/11/2016 0216   NITRITE NEGATIVE 09/11/2016 0216   LEUKOCYTESUR NEGATIVE 09/11/2016 0216    ----------------------------------------------------------------------------------------------------------------   Imaging Results:    Dg Chest 2 View  Result Date: 07/12/2018 CLINICAL DATA:  57 year old male with shortness of breath and syncope. EXAM: CHEST - 2 VIEW COMPARISON:  Chest radiograph dated 04/22/2018 FINDINGS: The heart size and  mediastinal contours are within normal limits. Both lungs are clear. The visualized skeletal structures are unremarkable. IMPRESSION: No active cardiopulmonary disease. Electronically Signed   By: Anner Crete M.D.   On: 07/12/2018 00:42   Ct Head Wo Contrast  Result Date: 07/12/2018 CLINICAL DATA:  Feeling unwell for 5 days. Dizziness, headache, tachycardia. History of hypertension, hyperlipidemia and diabetes. EXAM: CT HEAD WITHOUT CONTRAST TECHNIQUE: Contiguous axial images were obtained from the base of the skull through the vertex without intravenous contrast. COMPARISON:  CT HEAD December 03, 2005 FINDINGS: BRAIN: No intraparenchymal hemorrhage, mass effect nor midline shift. The ventricles and sulci are normal for age. No acute large vascular territory infarcts. No abnormal extra-axial fluid collections. Basal cisterns are patent. VASCULAR: Mild calcific atherosclerosis carotid siphon. SKULL/SOFT TISSUES: No skull fracture. No significant soft tissue swelling. ORBITS/SINUSES: The included ocular globes and orbital contents are normal.Trace paranasal sinus mucosal thickening. Mastoid air cells are well aerated. OTHER: None. IMPRESSION: Negative non-contrast CT HEAD for age. Electronically Signed   By: Elon Alas M.D.   On: 07/12/2018 00:51    ekg nsr at 95, nl axis, no st- t changes c/w ischemia   Assessment & Plan:    Active Problems:   Dizziness    Dizziness/ (Vertigo) w blurred vision Headache MRI brain  Check carotid ultrasound Check orthostatic bp  Chest pain,  Tele Trop I q6h x3 Check cardiac echo Pt recently had cardiac catheterization 2019 and recommendation was medical management  CAD s/p DES Cont aspirin Cont Lipitor 7m po qhs Cont Vascepa  Cont Cardizem 339mpo bid Cont Losartan 253mo qday Cont Ranexa  Hyperammonemia Lactulose Check ammonia in am  Acidosis STOP  Metformin  Dm2 Cont Glipizide Cont Invokana 325m po qday STOP Metformin as  above fsbs ac and qhs, ISS  Diabetic neuropathy Cont Gabapentin 6024mpo bid  Gerd Cont PPI  Anxiety Cont Zoloft 10011mo qday Cont Xanax 0.5mg76m tid      DVT Prophylaxis   Lovenox - SCDs   AM Labs Ordered, also please review Full Orders  Family Communication: Admission, patients condition and plan of care including tests being ordered have been discussed with the patient  who indicate understanding and agree with the plan and Code Status.  Code Status  FULL CODE  Likely DC to  home  Condition GUARDED    Consults called: none  Admission status:  observation   Time spent in minutes : 70   JameJani Gravel on 07/12/2018 at 2:46 AM  Between 7am to 7pm - Pager - [phone] 7pm go to [email or web address] - password [email or web address] - Office  [phone]

## 2018-07-12 NOTE — ED Notes (Signed)
Pt is claustrophobic and requires medication for MRI

## 2018-07-12 NOTE — Progress Notes (Signed)
CRITICAL VALUE ALERT  Critical Value:  Glucose 440   Date & Time Notied:  07/12/2018 at [place]  Provider Notified: Dr. Wynetta Emery  Orders Received/Actions taken: Started on sliding scale coverage.  Dr. Wynetta Emery in to see patient during this time as well.

## 2018-07-13 LAB — HIV ANTIBODY (ROUTINE TESTING W REFLEX): HIV Screen 4th Generation wRfx: NONREACTIVE

## 2018-07-14 ENCOUNTER — Telehealth (HOSPITAL_COMMUNITY): Payer: Self-pay | Admitting: *Deleted

## 2018-07-14 NOTE — Telephone Encounter (Signed)
He can take only 3 per day as prescribed

## 2018-07-14 NOTE — Telephone Encounter (Signed)
Dr Harrington Challenger Patient's wife called stating patient was hospitalized over the weekend due to h/a, chest pressure, & dizziness. Patient has recently as of week before last lost his best friend & according to wife grieving hard & even told by the E.R. Doctor that he would need to work thru the Grief. Everything checked out OK! Wife asked if she could give him a extra Xanax he's already has had 2 today  @ 8 am & 12 noon.

## 2018-07-23 ENCOUNTER — Ambulatory Visit: Payer: Self-pay | Admitting: Family Medicine

## 2018-07-24 ENCOUNTER — Ambulatory Visit (INDEPENDENT_AMBULATORY_CARE_PROVIDER_SITE_OTHER): Payer: PRIVATE HEALTH INSURANCE | Admitting: "Endocrinology

## 2018-07-24 ENCOUNTER — Encounter: Payer: Self-pay | Admitting: "Endocrinology

## 2018-07-24 VITALS — BP 114/78 | HR 96 | Ht 74.0 in | Wt 237.0 lb

## 2018-07-24 DIAGNOSIS — E1159 Type 2 diabetes mellitus with other circulatory complications: Secondary | ICD-10-CM | POA: Diagnosis not present

## 2018-07-24 DIAGNOSIS — I10 Essential (primary) hypertension: Secondary | ICD-10-CM

## 2018-07-24 DIAGNOSIS — E782 Mixed hyperlipidemia: Secondary | ICD-10-CM | POA: Diagnosis not present

## 2018-07-24 MED ORDER — METFORMIN HCL 500 MG PO TABS: 500.0000 mg | ORAL_TABLET | Freq: Two times a day (BID) | ORAL | 2 refills | Status: DC

## 2018-07-24 MED ORDER — GLIPIZIDE 5 MG PO TABS: 5.0000 mg | ORAL_TABLET | Freq: Two times a day (BID) | ORAL | 2 refills | Status: DC

## 2018-07-24 NOTE — Patient Instructions (Signed)

## 2018-07-24 NOTE — Progress Notes (Signed)
Endocrinology Consult Note       07/24/2018, 5:16 PM   Subjective:    Patient ID: Jared Griffin, male    DOB: Jan 20, 1961.  Jared Griffin is being seen in consultation for management of currently uncontrolled symptomatic diabetes requested by  Mikey Kirschner, MD.   Past Medical History:  Diagnosis Date  . Anxiety   . 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 Coronary atherosclerosis of native coronary artery    a. 12/09/2013: s/p PCI with 3.0 x 28 Promus DES to mLAD  . Cyst (solitary) of breast 10/03/2017   removed from back  . Depression   . Elbow injury 07/25/2017   surgery for torn tendon  . Essential hypertension, benign   . Fatty liver disease, nonalcoholic   . Lateral epicondylitis of right elbow   . Mixed hyperlipidemia 2006  . Obesity   . Osgood-Schlatter's disease of right knee   . Type 2 diabetes mellitus ([place])    Past Surgical History:  Procedure Laterality Date  . CARDIAC CATHETERIZATION  2011  . CARDIAC CATHETERIZATION N/A 09/20/2015   Procedure: Left Heart Cath and Coronary Angiography;  Surgeon: [name], MD;  Location: [facility];  Service: Cardiovascular;  Laterality: N/A;  . CHOLECYSTECTOMY  2003  . COLONOSCOPY  06/19/2012   Procedure: COLONOSCOPY;  Surgeon: [name], MD;  Location: [facility];  Service: Endoscopy;  Laterality: N/A;  930  . COLONOSCOPY N/A 10/17/2017   Procedure: COLONOSCOPY;  Surgeon: [name], MD;  Location: [facility];  Service: Endoscopy;  Laterality: N/A;  1225  . KNEE ARTHROPLASTY Right 1999  . LEFT HEART CATH AND CORONARY ANGIOGRAPHY N/A 02/26/2018   Procedure: LEFT HEART CATH AND CORONARY ANGIOGRAPHY;  Surgeon: [name], MD;  Location: [facility];  Service: Cardiovascular;  Laterality: N/A;  . LEFT HEART CATHETERIZATION WITH CORONARY ANGIOGRAM N/A 12/09/2013    Procedure: LEFT HEART CATHETERIZATION WITH CORONARY ANGIOGRAM;  Surgeon: [name], MD;  Location: [facility];  Service: Cardiovascular;  Laterality: N/A;  . Liver biopsy    . POLYPECTOMY  10/17/2017   Procedure: POLYPECTOMY;  Surgeon: [name], MD;  [facility]: [facility];  Service: Endoscopy;;  ascending colon, splenic flexure,sigmoid x2  . TENNIS ELBOW RELEASE/NIRSCHEL PROCEDURE Right 07/25/2017   Procedure: TENNIS ELBOW RELEASE and debriedment;  Surgeon: [name], MD;  Location: [facility];  Service: Orthopedics;  Laterality: Right;  . TONSILLECTOMY  1970's   Social History   Socioeconomic History  . Marital status: Married    Spouse name: Not on file  . Number of children: Not on file  . Years of education: Not on file  . Highest education level: Not on file  Occupational History  . Not on file  Social Needs  . Financial resource strain: Not on file  . Food insecurity:    Worry: Not on file    Inability: Not on file  . Transportation needs:    Medical: Not on file    Non-medical: Not on file  Tobacco Use  . Smoking status:  Former Smoker    Packs/day: 0.50    Years: 10.00    Pack years: 5.00    Types: Cigarettes    Start date: 02/02/1973    Last attempt to quit: 09/17/1983    Years since quitting: 34.8  . Smokeless tobacco: Former Systems developer    Types: Gamaliel date: 09/17/1983  Substance and Sexual Activity  . Alcohol use: No    Alcohol/week: 0.0 standard drinks    Comment: 11-30-15 per pt no  . Drug use: No    Comment: 11-30-15 per pt no  . Sexual activity: Yes  Lifestyle  . Physical activity:    Days per week: Not on file    Minutes per session: Not on file  . Stress: Not on file  Relationships  . Social connections:    Talks on phone: Not on file    Gets together: Not on file    Attends religious service: Not on file    Active member of club or organization: Not on file    Attends meetings of clubs or organizations: Not on file     Relationship status: Not on file  Other Topics Concern  . 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    Married with 1 child   Right handed    12 th    Very little caffeine   Outpatient Encounter Medications as of 07/24/2018  Medication Sig  . ACCU-CHEK AVIVA PLUS test strip CHECK BLOOD SUGAR UP TO FOUR TIMES A DAY.  Marland Kitchen albuterol (PROVENTIL HFA;VENTOLIN HFA) 108 (90 Base) MCG/ACT inhaler Inhale 2 puffs into the lungs every 6 (six) hours as needed for wheezing or shortness of breath.  . ALPRAZolam (XANAX) 0.5 MG tablet TAKE (1) TABLET BY MOUTH (3) TIMES DAILY.  Marland Kitchen aspirin (ASPIR-LOW) 81 MG EC tablet Take 1 tablet (81 mg total) by mouth daily.  Marland Kitchen atorvastatin (LIPITOR) 40 MG tablet TAKE ONE (1) TABLET BY MOUTH EVERY DAY  . diltiazem (CARDIZEM) 30 MG tablet Take 1 tablet (30 mg total) by mouth 2 (two) times daily.  Marland Kitchen gabapentin (NEURONTIN) 300 MG capsule Take 600 mg by mouth 2 (two) times daily.   Marland Kitchen glipiZIDE (GLUCOTROL) 5 MG tablet Take 1 tablet (5 mg total) by mouth 2 (two) times daily with a meal.  . HYDROcodone-acetaminophen (NORCO) 5-325 MG tablet Take 1 tablet by mouth every 6 (six) hours as needed for moderate pain.  Vanessa Kick Ethyl (VASCEPA) 1 g CAPS Take 2 capsules (2 g total) by mouth 2 (two) times daily.  Marland Kitchen losartan (COZAAR) 25 MG tablet TAKE ONE (1) TABLET BY MOUTH EVERY DAY  . meclizine (ANTIVERT) 25 MG tablet Take 1 tablet (25 mg total) by mouth 3 (three) times daily as needed for dizziness.  . metFORMIN (GLUCOPHAGE) 500 MG tablet Take 1 tablet (500 mg total) by mouth 2 (two) times daily with a meal.  . nitroGLYCERIN (NITROSTAT) 0.4 MG SL tablet Place 1 tablet (0.4 mg total) under the tongue every 5 (five) minutes as needed for chest pain.  . pantoprazole (PROTONIX) 40 MG tablet TAKE (1) TABLET BY MOUTH ONCE DAILY.  . ranolazine (RANEXA) 500 MG 12 hr tablet TAKE (1) TABLET BY MOUTH TWICE DAILY.   Marland Kitchen sertraline (ZOLOFT) 100 MG tablet Take 1 tablet (100 mg total) by mouth daily.  . [DISCONTINUED] glipiZIDE (GLUCOTROL) 5 MG tablet Take 2 tablets (10 mg total) by  mouth 2 (two) times daily.  . [DISCONTINUED] INVOKANA 300 MG TABS tablet TAKE ONE TABLET BY MOUTH ONCE DAILY WITH BREAKFAST.  . [DISCONTINUED] metFORMIN (GLUCOPHAGE) 500 MG tablet Take 2 tablets (1,000 mg total) by mouth 2 (two) times daily with a meal.   No facility-administered encounter medications on file as of 07/24/2018.     ALLERGIES: Allergies  Allergen Reactions  . Metoprolol Rash    VACCINATION STATUS: Immunization History  Administered Date(s) Administered  . Influenza,inj,Quad PF,6+ Mos 07/11/2016, 08/02/2017  . Influenza-Unspecified 06/18/2013, 06/17/2014, 07/06/2015  . Td 01/28/2014    Diabetes  He presents for his initial diabetic visit. He has type 2 diabetes mellitus. Onset time: He was diagnosed at approximate age of 9 years. His disease course has been worsening. There are no hypoglycemic associated symptoms. Pertinent negatives for hypoglycemia include no confusion, headaches, pallor or seizures. Associated symptoms include polydipsia and polyuria. Pertinent negatives for diabetes include no chest pain, no fatigue, no polyphagia and no weakness. There are no hypoglycemic complications. Symptoms are worsening. Diabetic complications include heart disease. Risk factors for coronary artery disease include diabetes mellitus, dyslipidemia, hypertension, male sex, obesity, sedentary lifestyle and tobacco exposure. Current diabetic treatment includes oral agent (dual therapy) (He is currently on Invokana 300 mg p.o. daily, metformin 500 mg p.o. twice daily, glipizide 5 mg p.o. twice daily.). His weight is fluctuating minimally. He is following a generally unhealthy diet. When asked about meal planning, he reported none. He has not had a previous visit with a dietitian. He rarely participates in exercise. His home  blood glucose trend is increasing steadily. (He did not bring any meter nor logs to review.  His A1c was 9% on July 12, 2018.) An ACE inhibitor/angiotensin II receptor blocker is being taken. Eye exam is current.  Hyperlipidemia  This is a chronic problem. The problem is uncontrolled (He has severe hypertriglyceridemia at 1157.). Exacerbating diseases include diabetes and obesity. Pertinent negatives include no chest pain, myalgias or shortness of breath. Risk factors for coronary artery disease include diabetes mellitus, dyslipidemia, hypertension, male sex, obesity and a sedentary lifestyle.  Hypertension  This is a chronic problem. The current episode started more than 1 year ago. The problem is controlled. Pertinent negatives include no chest pain, headaches, neck pain, palpitations or shortness of breath. Risk factors for coronary artery disease include diabetes mellitus, dyslipidemia, male gender, obesity, sedentary lifestyle and smoking/tobacco exposure. Hypertensive end-organ damage includes CAD/MI.    Review of Systems  Constitutional: Negative for chills, fatigue, fever and unexpected weight change.  HENT: Negative for dental problem, mouth sores and trouble swallowing.   Eyes: Negative for visual disturbance.  Respiratory: Negative for cough, choking, chest tightness, shortness of breath and wheezing.   Cardiovascular: Negative for chest pain, palpitations and leg swelling.  Gastrointestinal: Negative for abdominal distention, abdominal pain, constipation, diarrhea, nausea and vomiting.  Endocrine: Positive for polydipsia and polyuria. Negative for polyphagia.  Genitourinary: Negative for dysuria, flank pain, hematuria and urgency.  Musculoskeletal: Negative for back pain, gait problem, myalgias and neck pain.  Skin: Negative for pallor, rash and wound.  Neurological: Negative for seizures, syncope, weakness, numbness and headaches.  Psychiatric/Behavioral: Negative for confusion and  dysphoric mood.    Objective:    BP 114/78   Pulse 96   Ht 6' 2"  (1.88 m)   Wt 237 lb (107.5 kg)   BMI 30.43 kg/m   Wt Readings from Last 3 Encounters:  07/24/18 237 lb (107.5 kg)  07/12/18 242 lb 8  oz (110 kg)  07/09/18 239 lb (108.4 kg)     Physical Exam  Constitutional: He is oriented to person, place, and time. He appears well-developed and well-nourished. He is cooperative. No distress.  HENT:  Head: Normocephalic and atraumatic.  Eyes: EOM are normal.  Neck: Normal range of motion. Neck supple. No tracheal deviation present. No thyromegaly present.  Cardiovascular: Normal rate, S1 normal, S2 normal and normal heart sounds. Exam reveals no gallop.  No murmur heard. Pulses:      Dorsalis pedis pulses are 1+ on the right side, and 1+ on the left side.       Posterior tibial pulses are 1+ on the right side, and 1+ on the left side.  Pulmonary/Chest: Breath sounds normal. No respiratory distress. He has no wheezes.  Abdominal: Soft. Bowel sounds are normal. He exhibits no distension. There is no tenderness. There is no guarding and no CVA tenderness.  Musculoskeletal: He exhibits no edema.       Right shoulder: He exhibits no swelling and no deformity.  Neurological: He is alert and oriented to person, place, and time. He has normal strength and normal reflexes. No cranial nerve deficit or sensory deficit. Gait normal.  Skin: Skin is warm and dry. No rash noted. No cyanosis. Nails show no clubbing.  Psychiatric: He has a normal mood and affect. His speech is normal. Judgment normal. Cognition and memory are normal.    CMP ( most recent) CMP     Component Value Date/Time   NA 136 07/12/2018 0025   NA 138 02/18/2018 1021   K 4.0 07/12/2018 0025   CL 102 07/12/2018 0025   CO2 20 (L) 07/12/2018 0025   GLUCOSE 225 (H) 07/12/2018 0025   BUN 20 07/12/2018 0025   BUN 14 02/18/2018 1021   CREATININE 0.98 07/12/2018 0025   CREATININE 0.96 12/07/2013 0950   CALCIUM 9.7  07/12/2018 0025   PROT 7.2 07/12/2018 0025   PROT 7.2 05/13/2018 0959   ALBUMIN 4.3 07/12/2018 0025   ALBUMIN 4.8 05/13/2018 0959   AST 29 07/12/2018 0025   ALT 41 07/12/2018 0025   ALKPHOS 41 07/12/2018 0025   BILITOT 1.0 07/12/2018 0025   BILITOT 0.7 05/13/2018 0959   GFRNONAA >60 07/12/2018 0025   GFRAA >60 07/12/2018 0025     Diabetic Labs (most recent): Lab Results  Component Value Date   HGBA1C 9.0 (H) 07/12/2018   HGBA1C 7.4 12/02/2017   HGBA1C 8.5 (H) 07/23/2017     Lipid Panel ( most recent) Lipid Panel     Component Value Date/Time   CHOL 172 07/12/2018 0713   CHOL 161 05/13/2018 0959   TRIG 1,157 (H) 07/12/2018 0713   HDL 27 (L) 07/12/2018 0713   HDL 32 (L) 05/13/2018 0959   CHOLHDL 6.4 07/12/2018 0713   VLDL UNABLE TO CALCULATE IF TRIGLYCERIDE OVER 400 mg/dL 07/12/2018 0713   LDLCALC UNABLE TO CALCULATE IF TRIGLYCERIDE OVER 400 mg/dL 07/12/2018 0713   LDLCALC 54 05/13/2018 0959      Lab Results  Component Value Date   TSH 0.525 07/12/2018   TSH 0.458 04/13/2017   TSH 0.493 02/05/2014      Assessment & Plan:   1. DM type 2 causing vascular disease ([place])  - [name] has currently uncontrolled symptomatic type 2 DM since 57 years of age,  with most recent A1c of 9 %. Recent labs reviewed.  -his diabetes is complicated by coronary artery disease, obesity/sedentary life, history of smoking  and he remains at a high risk for more acute and chronic complications which include CAD, CVA, CKD, retinopathy, and neuropathy. These are all discussed in detail with him.  - I have counseled him on diet management and weight loss, by adopting a carbohydrate restricted/protein rich diet.  - Suggestion is made for him to avoid simple carbohydrates  from his diet including Cakes, Sweet Desserts, Ice Cream, Soda (diet and regular), Sweet Tea, Candies, Chips, Cookies, Store Bought Juices, Alcohol in Excess of  1-2 drinks a day, Artificial Sweeteners,  Coffee  Creamer, and "Sugar-free" Products. This will help patient to have more stable blood glucose profile and potentially avoid unintended weight gain.  - I encouraged him to switch to  unprocessed or minimally processed complex starch and increased protein intake (animal or plant source), fruits, and vegetables.  - he is advised to stick to a routine mealtimes to eat 3 meals  a day and avoid unnecessary snacks ( to snack only to correct hypoglycemia).   - he will be scheduled with Jearld Fenton, RDN, CDE for individualized diabetes education.  - I have approached him with the following individualized plan to manage diabetes and patient agrees:   -Considering his prevailing glycemic burden as well as severe hypertriglyceridemia, he will likely require insulin treatment in order for him to achieve and maintain control of diabetes to target.   -For this to happen, his commitment for proper monitoring should be assured first.   -In Preparation, he is approached to start strict monitoring of glucose 4 times a day-before meals and at bedtime. - he is encouraged to call clinic for blood glucose levels less than 70 or above 300 mg /dl. - I will continue metformin 500 mg p.o. twice daily, therapeutically suitable for patient . - I will discontinue Invokana, risk outweighs benefit for this patient. -I advised him to continue glipizide 5 mg p.o. twice daily with breakfast and supper.  This medication will be discontinued if he ends up requiring insulin treatment.  -Due to severe hypertriglyceridemia,  he is not a suitable candidate for incretin therapy for now.  He will be considered as appropriate on subsequent visits. - Patient specific target  A1c;  LDL, HDL, Triglycerides, and  Waist Circumference were discussed in detail.  2) BP/HTN:  his blood pressure is  controlled to target.   he is advised to continue his current medications including losartan 25 mg p.o. daily with breakfast . 3) Lipids/HPL:    Review of his recent lipid panel showed unknown LDL due to severe hypertriglyceridemia of 1157+. -He is advised to continue atorvastatin 40 mg p.o. nightly, may require fenofibrate added to his regimen.  This makes him an suitable candidate for incretin therapy until triglycerides drop to safe range.  4)  Weight/Diet:  Body mass index is 30.43 kg/m.  - clearly complicating his diabetes care.  I discussed with him the fact that loss of 5 - 10% of his  current body weight will have the most impact on his diabetes management.  CDE Consult will be initiated . Exercise, and detailed carbohydrates information provided  -  detailed on discharge instructions.  5) Chronic Care/Health Maintenance:  -he  is on ACEI/ARB and Statin medications and  is encouraged to initiate and continue to follow up with Ophthalmology, Dentist,  Podiatrist at least yearly or according to recommendations, and advised to   stay away from smoking. I have recommended yearly flu vaccine and pneumonia vaccine at least every 5 years; moderate  intensity exercise for up to 150 minutes weekly; and  sleep for at least 7 hours a day.  - I advised patient to maintain close follow up with Mikey Kirschner, MD for primary care needs.  - Time spent with the patient: 45 minutes, of which >50% was spent in obtaining information about his symptoms, reviewing his previous labs, evaluations, and treatments, counseling him about his currently uncontrolled, symptomatic, complicated type 2 diabetes; hyperlipidemia, hypertension,, and developing plans for long term treatment based on the latest recommendations.  [name] participated in the discussions, expressed understanding, and voiced agreement with the above plans.  All questions were answered to his satisfaction. he is encouraged to contact clinic should he have any questions or concerns prior to his return visit.  Follow up plan: - Return in about 2 weeks (around 08/07/2018) for  Follow up with Meter and Logs Only - no Labs.  Glade Lloyd, MD [facility] [address] [place], [place] [address] Phone: [phone]  Fax: [phone]    07/24/2018, 5:16 PM  This note was partially dictated with voice recognition software. Similar sounding words can be transcribed inadequately or may not  be corrected upon review.

## 2018-07-29 ENCOUNTER — Ambulatory Visit: Payer: Self-pay | Admitting: Nutrition

## 2018-07-29 ENCOUNTER — Telehealth: Payer: Self-pay | Admitting: Nutrition

## 2018-07-29 NOTE — Telephone Encounter (Signed)
TC to pt for missed appt. Pt's wife noted that pt was sick and has been throwing up since 3 am. Will call back later to reschedule missed appt. She verbalized understanding.

## 2018-08-04 ENCOUNTER — Encounter: Payer: PRIVATE HEALTH INSURANCE | Admitting: Family Medicine

## 2018-08-05 ENCOUNTER — Ambulatory Visit (INDEPENDENT_AMBULATORY_CARE_PROVIDER_SITE_OTHER): Payer: PRIVATE HEALTH INSURANCE | Admitting: Family Medicine

## 2018-08-05 ENCOUNTER — Encounter: Payer: Self-pay | Admitting: Family Medicine

## 2018-08-05 VITALS — BP 124/80 | Ht 74.0 in | Wt 240.8 lb

## 2018-08-05 DIAGNOSIS — I10 Essential (primary) hypertension: Secondary | ICD-10-CM | POA: Diagnosis not present

## 2018-08-05 DIAGNOSIS — Z0000 Encounter for general adult medical examination without abnormal findings: Secondary | ICD-10-CM | POA: Diagnosis not present

## 2018-08-05 DIAGNOSIS — K7460 Unspecified cirrhosis of liver: Secondary | ICD-10-CM | POA: Diagnosis not present

## 2018-08-05 DIAGNOSIS — K760 Fatty (change of) liver, not elsewhere classified: Secondary | ICD-10-CM | POA: Diagnosis not present

## 2018-08-05 DIAGNOSIS — E1159 Type 2 diabetes mellitus with other circulatory complications: Secondary | ICD-10-CM | POA: Diagnosis not present

## 2018-08-05 DIAGNOSIS — E782 Mixed hyperlipidemia: Secondary | ICD-10-CM

## 2018-08-05 DIAGNOSIS — E785 Hyperlipidemia, unspecified: Secondary | ICD-10-CM

## 2018-08-05 DIAGNOSIS — Z23 Encounter for immunization: Secondary | ICD-10-CM

## 2018-08-05 MED ORDER — PANTOPRAZOLE SODIUM 40 MG PO TBEC: DELAYED_RELEASE_TABLET | ORAL | 1 refills | Status: DC

## 2018-08-05 MED ORDER — DILTIAZEM HCL 30 MG PO TABS: 30.0000 mg | ORAL_TABLET | Freq: Two times a day (BID) | ORAL | 1 refills | Status: DC

## 2018-08-05 NOTE — Progress Notes (Signed)
Subjective:    Patient ID: Jared Griffin, male    DOB: 09-Aug-1961, 57 y.o.   MRN: [id number]  HPI The patient comes in today for a wellness visit.    A review of their health history was completed.  A review of medications was also completed.  Any needed refills; none  Eating habits: health conscious  Falls/  MVA accidents in past few months: none  Regular exercise: walks some  Specialist pt sees on regular basis: Dr. Dorris Fetch for sugar, cardiologist, psych,  Preventative health issues were discussed.   Additional concerns: right ear fullness. Finished all meds for ear and not any better. Trying to get sugar back under control. Seeing Dr. Dorris Fetch. Last seen 11/7 and goes back in December.   pt holding off on neuro for now since h a better   Blood pressure medicine and blood pressure levels reviewed today with patient. Compliant with blood pressure medicine. States does not miss a dose. No obvious side effects. Blood pressure generally good when checked elsewhere. Watching salt intake.   Reg eye doc visits  Patient also was recently in the hospital for transient changes in mentation.  He had elevated ammonia at this time.  Also his sugar was up.  Patient has known history of cirrhosis of the liver.  Followed by gastroenterologist for this.  And his history he never recalls being told he had elevated ammonia in the past     Review of Systems  Constitutional: Negative for activity change, appetite change and fever.  HENT: Negative for congestion and rhinorrhea.   Eyes: Negative for discharge.  Respiratory: Negative for cough and wheezing.   Cardiovascular: Negative for chest pain.  Gastrointestinal: Negative for abdominal pain, blood in stool and vomiting.  Genitourinary: Negative for difficulty urinating and frequency.  Musculoskeletal: Negative for neck pain.  Skin: Negative for rash.  Allergic/Immunologic: Negative for environmental allergies and food allergies.    Neurological: Negative for weakness and headaches.  Psychiatric/Behavioral: Negative for agitation.  All other systems reviewed and are negative.      Objective:   Physical Exam  Constitutional: He appears well-developed and well-nourished.  HENT:  Head: Normocephalic and atraumatic.  Right Ear: External ear normal.  Left Ear: External ear normal.  Nose: Nose normal.  Mouth/Throat: Oropharynx is clear and moist.  Eyes: Pupils are equal, round, and reactive to light. EOM are normal.  Neck: Normal range of motion. Neck supple. No thyromegaly present.  Cardiovascular: Normal rate, regular rhythm and normal heart sounds.  No murmur heard. Pulmonary/Chest: Effort normal and breath sounds normal. No respiratory distress. He has no wheezes.  Abdominal: Soft. Bowel sounds are normal. He exhibits no distension and no mass. There is no tenderness.  Genitourinary: Penis normal.  Musculoskeletal: Normal range of motion. He exhibits no edema.  Lymphadenopathy:    He has no cervical adenopathy.  Neurological: He is alert. He exhibits normal muscle tone.  Skin: Skin is warm and dry. No erythema.  Psychiatric: He has a normal mood and affect. His behavior is normal. Judgment normal.  Vitals reviewed.         Assessment & Plan:  Impression #1 wellness exam.  Diet discussed.  Exercise discussed.  Vaccines discussed and understood work right.  Up-to-date on colonoscopy  2.  Hypertension.  Good control discussed to maintain same meds  3.  Right ear fullness.  Etiology unclear.  Ear exam normal  4.  Status post hospitalization with elevated ammonia.  Somewhat concerning.  Discussed with patient at length.  No recent ammonia since discharge.  We need to reevaluate this.  Discussed.  Follow-up in 6 months  Of note now seeing Dr. Dorris Fetch for diabetes management

## 2018-08-06 ENCOUNTER — Encounter: Payer: PRIVATE HEALTH INSURANCE | Attending: "Endocrinology | Admitting: Nutrition

## 2018-08-06 ENCOUNTER — Encounter: Payer: Self-pay | Admitting: Nutrition

## 2018-08-06 VITALS — Ht 74.0 in | Wt 241.0 lb

## 2018-08-06 DIAGNOSIS — E1159 Type 2 diabetes mellitus with other circulatory complications: Secondary | ICD-10-CM | POA: Diagnosis not present

## 2018-08-06 DIAGNOSIS — E669 Obesity, unspecified: Secondary | ICD-10-CM

## 2018-08-06 NOTE — Patient Instructions (Signed)
Goals 1.Follow MY Plate 2. Eat meals on times 3. Eet 3-4 carb choice per meal 4. Increase low carb vegetables to 2 at lunch and dinner. Exercise by walking and exercise with bike 30-60 mins 3-4 times per week Lose 2-3 lbs per month.

## 2018-08-06 NOTE — Progress Notes (Signed)
Medical Nutrition Therapy:  Appt start time: 1400 end time:  5631.   Assessment:  Primary concerns today: Diabetes Type 2 x 15 yrs. Hypertriglyceridemia. Lives with wife and son . He and his wife shop and cook. He on disability. Eats 3-4 meals per day. Physical activitity: walking and exercise bike 2-3 times per week.  Testing blood sugars 4 times per day. Metformin 500 mg BID and Glipizide 10 mg BID. BS log shows BS 200-300's. Cut out sodas juice and tea. Drinks only water. Has cut out sweets. Snacks only nuts or popcorn. Sees Dr. Dorris Fetch, Endocrinology. Next appt. 08/18/18. Has had a stressful month or so with losing a friend to cancer. BS have been higher than usual. Has polydipsia, polyphagia and polyuria. No low blood sugars.  Willing to make changes with diet and exercise to improve blood sugars. Is aware that he may need insulin to improve blood sugar control. Lab Results  Component Value Date   HGBA1C 9.0 (H) 07/12/2018   CMP Latest Ref Rng & Units 07/12/2018 05/13/2018 02/18/2018  Glucose 70 - 99 mg/dL 225(H) - 248(H)  BUN 6 - 20 mg/dL 20 - 14  Creatinine 0.61 - 1.24 mg/dL 0.98 - 0.83  Sodium 135 - 145 mmol/L 136 - 138  Potassium 3.5 - 5.1 mmol/L 4.0 - 4.3  Chloride 98 - 111 mmol/L 102 - 98  CO2 22 - 32 mmol/L 20(L) - 23  Calcium 8.9 - 10.3 mg/dL 9.7 - 10.0  Total Protein 6.5 - 8.1 g/dL 7.2 7.2 -  Total Bilirubin 0.3 - 1.2 mg/dL 1.0 0.7 -  Alkaline Phos 38 - 126 U/L 41 44 -  AST 15 - 41 U/L 29 20 -  ALT 0 - 44 U/L 41 32 -   Lipid Panel     Component Value Date/Time   CHOL 172 07/12/2018 0713   CHOL 161 05/13/2018 0959   TRIG 1,157 (H) 07/12/2018 0713   HDL 27 (L) 07/12/2018 0713   HDL 32 (L) 05/13/2018 0959   CHOLHDL 6.4 07/12/2018 0713   VLDL UNABLE TO CALCULATE IF TRIGLYCERIDE OVER 400 mg/dL 07/12/2018 0713   LDLCALC UNABLE TO CALCULATE IF TRIGLYCERIDE OVER 400 mg/dL 07/12/2018 0713   LDLCALC 54 05/13/2018 0959      Preferred Learning Style:   No preference  indicated   Learning Readiness:  Ready  Change in progress   MEDICATIONS:    DIETARY INTAKE:   24-hr recall:  B ( AM): scrabmled eggs, biscuit , gravy or 3-4 eggs and grits or oatmeal, water Snk ( AM): nuts L ( PM): BBQ sandwich 2, [place] stew homemade 1 cup, crackers 6/7 and wate Snk ( PM): D ( PM):  Toss salad, itlalain dressing,water, Snk ( PM): peanuts Beverages: water  Usual physical activity: walks some and exercise bike.  Estimated energy needs: 2000 calories 225 g carbohydrates 150 g protein 56 g fat  Progress Towards Goal(s):  In progress.   Nutritional Diagnosis:  NB-1.1 Food and nutrition-related knowledge deficit As related to Diabetes Type 2.  As evidenced by A1C 9%.    Intervention:  Nutrition and Diabetes education provided on My Plate, CHO counting, meal planning, portion sizes, timing of meals, avoiding snacks between meals unless having a low blood sugar, target ranges for A1C and blood sugars, signs/symptoms and treatment of hyper/hypoglycemia, monitoring blood sugars, taking medications as prescribed, benefits of exercising 30 minutes per day and prevention of complications of DM.  Marland KitchenGoals 1.Follow MY Plate 2. Eat meals on times 3.  Eet 3-4 carb choice per meal 4. Increase low carb vegetables to 2 at lunch and dinner. Exercise by walking and exercise with bike 30-60 mins 3-4 times per week Lose 2-3 lbs per month.  Teaching Method Utilized:  Visual Auditory Hands on  Handouts given during visit include:  The Plate Method    Meal Plan Card   Barriers to learning/adherence to lifestyle change: none  Demonstrated degree of understanding via:  Teach Back   Monitoring/Evaluation:  Dietary intake, exercise, meal planing, and body weight in 2 weeks. He would benefit from initiation of insulin for better blood sugar control and to reduce TG.

## 2018-08-07 ENCOUNTER — Encounter: Payer: Self-pay | Admitting: "Endocrinology

## 2018-08-07 ENCOUNTER — Ambulatory Visit (INDEPENDENT_AMBULATORY_CARE_PROVIDER_SITE_OTHER): Payer: PRIVATE HEALTH INSURANCE | Admitting: "Endocrinology

## 2018-08-07 VITALS — BP 121/81 | HR 92 | Ht 74.0 in | Wt 241.0 lb

## 2018-08-07 DIAGNOSIS — E782 Mixed hyperlipidemia: Secondary | ICD-10-CM

## 2018-08-07 DIAGNOSIS — E1159 Type 2 diabetes mellitus with other circulatory complications: Secondary | ICD-10-CM | POA: Diagnosis not present

## 2018-08-07 DIAGNOSIS — I10 Essential (primary) hypertension: Secondary | ICD-10-CM | POA: Diagnosis not present

## 2018-08-07 MED ORDER — INSULIN PEN NEEDLE 31G X 8 MM MISC: 1.0000 | 3 refills | Status: DC

## 2018-08-07 MED ORDER — INSULIN DEGLUDEC 100 UNIT/ML ~~LOC~~ SOPN: 20.0000 [IU] | PEN_INJECTOR | Freq: Every day | SUBCUTANEOUS | 2 refills | Status: DC

## 2018-08-07 NOTE — Patient Instructions (Signed)

## 2018-08-07 NOTE — Progress Notes (Signed)
Endocrinology follow-up Note       08/07/2018, 3:26 PM   Subjective:    Patient ID: Jared Griffin, male    DOB: 04-01-1961.  Jared Griffin is being seen in consultation for management of currently uncontrolled symptomatic type 2 diabetes, hyperlipidemia, hypertension. PMD:  Mikey Kirschner, MD.   Past Medical History:  Diagnosis Date  . Anxiety   . 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 Coronary atherosclerosis of native coronary artery    a. 12/09/2013: s/p PCI with 3.0 x 28 Promus DES to mLAD  . Cyst (solitary) of breast 10/03/2017   removed from back  . Depression   . Elbow injury 07/25/2017   surgery for torn tendon  . Essential hypertension, benign   . Fatty liver disease, nonalcoholic   . Lateral epicondylitis of right elbow   . Mixed hyperlipidemia 2006  . Obesity   . Osgood-Schlatter's disease of right knee   . Type 2 diabetes mellitus ([place])    Past Surgical History:  Procedure Laterality Date  . CARDIAC CATHETERIZATION  2011  . CARDIAC CATHETERIZATION N/A 09/20/2015   Procedure: Left Heart Cath and Coronary Angiography;  Surgeon: [name], MD;  Location: [facility];  Service: Cardiovascular;  Laterality: N/A;  . CHOLECYSTECTOMY  2003  . COLONOSCOPY  06/19/2012   Procedure: COLONOSCOPY;  Surgeon: [name], MD;  [facility]: [facility];  Service: Endoscopy;  Laterality: N/A;  930  . COLONOSCOPY N/A 10/17/2017   Procedure: COLONOSCOPY;  Surgeon: [name], MD;  Location: [facility];  Service: Endoscopy;  Laterality: N/A;  1225  . KNEE ARTHROPLASTY Right 1999  . LEFT HEART CATH AND CORONARY ANGIOGRAPHY N/A 02/26/2018   Procedure: LEFT HEART CATH AND CORONARY ANGIOGRAPHY;  Surgeon: [name], MD;  Location: [facility];  Service: Cardiovascular;  Laterality: N/A;  . LEFT HEART CATHETERIZATION WITH CORONARY  ANGIOGRAM N/A 12/09/2013   Procedure: LEFT HEART CATHETERIZATION WITH CORONARY ANGIOGRAM;  Surgeon: [name], MD;  Location: [facility];  Service: Cardiovascular;  Laterality: N/A;  . Liver biopsy    . POLYPECTOMY  10/17/2017   Procedure: POLYPECTOMY;  Surgeon: [name], MD;  [facility];  Service: Endoscopy;;  ascending colon, splenic flexure,sigmoid x2  . TENNIS ELBOW RELEASE/NIRSCHEL PROCEDURE Right 07/25/2017   Procedure: TENNIS ELBOW RELEASE and debriedment;  Surgeon: [name], MD;  Location: [facility];  Service: Orthopedics;  Laterality: Right;  . TONSILLECTOMY  1970's   Social History   Socioeconomic History  . Marital status: Married    Spouse name: Not on file  . Number of children: Not on file  . Years of education: Not on file  . Highest education level: Not on file  Occupational History  . Not on file  Social Needs  . Financial resource strain: Not on file  . Food insecurity:    Worry: Not on file    Inability: Not on file  . Transportation needs:    Medical: Not on file    Non-medical: Not on file  Tobacco Use  .  Smoking status: Former Smoker    Packs/day: 0.50    Years: 10.00    Pack years: 5.00    Types: Cigarettes    Start date: 02/02/1973    Last attempt to quit: 09/17/1983    Years since quitting: 34.9  . Smokeless tobacco: Former Systems developer    Types: [place] date: 09/17/1983  Substance and Sexual Activity  . Alcohol use: No    Alcohol/week: 0.0 standard drinks    Comment: 11-30-15 per pt no  . Drug use: No    Comment: 11-30-15 per pt no  . Sexual activity: Yes  Lifestyle  . Physical activity:    Days per week: Not on file    Minutes per session: Not on file  . Stress: Not on file  Relationships  . Social connections:    Talks on phone: Not on file    Gets together: Not on file    Attends religious service: Not on file    Active member of club or organization: Not on file    Attends meetings of clubs or  organizations: Not on file    Relationship status: Not on file  Other Topics Concern  . 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    Married with 1 child   Right handed    12 th    Very little caffeine   Outpatient Encounter Medications as of 08/07/2018  Medication Sig  . ACCU-CHEK AVIVA PLUS test strip CHECK BLOOD SUGAR UP TO FOUR TIMES A DAY.  Marland Kitchen albuterol (PROVENTIL HFA;VENTOLIN HFA) 108 (90 Base) MCG/ACT inhaler Inhale 2 puffs into the lungs every 6 (six) hours as needed for wheezing or shortness of breath.  . ALPRAZolam (XANAX) 0.5 MG tablet TAKE (1) TABLET BY MOUTH (3) TIMES DAILY.  Marland Kitchen aspirin (ASPIR-LOW) 81 MG EC tablet Take 1 tablet (81 mg total) by mouth daily.  Marland Kitchen atorvastatin (LIPITOR) 40 MG tablet TAKE ONE (1) TABLET BY MOUTH EVERY DAY  . diltiazem (CARDIZEM) 30 MG tablet Take 1 tablet (30 mg total) by mouth 2 (two) times daily.  Marland Kitchen gabapentin (NEURONTIN) 300 MG capsule Take 600 mg by mouth 2 (two) times daily.   Marland Kitchen glipiZIDE (GLUCOTROL) 5 MG tablet Take 1 tablet (5 mg total) by mouth 2 (two) times daily with a meal.  . HYDROcodone-acetaminophen (NORCO) 5-325 MG tablet Take 1 tablet by mouth every 6 (six) hours as needed for moderate pain.  Vanessa Kick Ethyl (VASCEPA) 1 g CAPS Take 2 capsules (2 g total) by mouth 2 (two) times daily.  . insulin degludec (TRESIBA FLEXTOUCH) 100 UNIT/ML SOPN FlexTouch Pen Inject 0.2 mLs (20 Units total) into the skin at bedtime.  . Insulin Pen Needle (B-D ULTRAFINE III SHORT PEN) 31G X 8 MM MISC 1 each by Does not apply route as directed.  Marland Kitchen losartan (COZAAR) 25 MG tablet TAKE ONE (1) TABLET BY MOUTH EVERY DAY  . meclizine (ANTIVERT) 25 MG tablet Take 1 tablet (25 mg total) by mouth 3 (three) times daily as needed for dizziness.  . metFORMIN (GLUCOPHAGE) 500 MG tablet Take 1 tablet (500 mg total) by mouth 2 (two) times daily with a meal.  . nitroGLYCERIN (NITROSTAT)  0.4 MG SL tablet Place 1 tablet (0.4 mg total) under the tongue every 5 (five) minutes as needed for chest pain.  . pantoprazole (PROTONIX) 40 MG tablet Take one daily  . ranolazine (  RANEXA) 500 MG 12 hr tablet TAKE (1) TABLET BY MOUTH TWICE DAILY.  Marland Kitchen sertraline (ZOLOFT) 100 MG tablet Take 1 tablet (100 mg total) by mouth daily.   No facility-administered encounter medications on file as of 08/07/2018.     ALLERGIES: Allergies  Allergen Reactions  . Metoprolol Rash    VACCINATION STATUS: Immunization History  Administered Date(s) Administered  . Influenza,inj,Quad PF,6+ Mos 07/11/2016, 08/02/2017, 08/05/2018  . Influenza-Unspecified 06/18/2013, 06/17/2014, 07/06/2015  . Td 01/28/2014    Diabetes  He presents for his follow-up diabetic visit. He has type 2 diabetes mellitus. Onset time: He was diagnosed at approximate age of 78 years. His disease course has been worsening. There are no hypoglycemic associated symptoms. Pertinent negatives for hypoglycemia include no confusion, headaches, pallor or seizures. Associated symptoms include polydipsia and polyuria. Pertinent negatives for diabetes include no chest pain, no fatigue, no polyphagia and no weakness. There are no hypoglycemic complications. Symptoms are worsening. Diabetic complications include heart disease. Risk factors for coronary artery disease include diabetes mellitus, dyslipidemia, hypertension, male sex, obesity, sedentary lifestyle and tobacco exposure. Current diabetic treatment includes oral agent (dual therapy) (He is currently on Invokana 300 mg p.o. daily, metformin 500 mg p.o. twice daily, glipizide 5 mg p.o. twice daily.). His weight is fluctuating minimally. He is following a generally unhealthy diet. When asked about meal planning, he reported none. He has not had a previous visit with a dietitian. He rarely participates in exercise. His home blood glucose trend is increasing steadily. His breakfast blood glucose range  is generally >200 mg/dl. His lunch blood glucose range is generally >200 mg/dl. His dinner blood glucose range is generally >200 mg/dl. His bedtime blood glucose range is generally >200 mg/dl. His overall blood glucose range is >200 mg/dl. (He did bring his logs showing persistently above target glycemic profile averaging greater than 200 mg/dL.   His A1c was 9% on July 12, 2018.) An ACE inhibitor/angiotensin II receptor blocker is being taken. Eye exam is current.  Hyperlipidemia  This is a chronic problem. The problem is uncontrolled (He has severe hypertriglyceridemia at 1157.). Exacerbating diseases include diabetes and obesity. Pertinent negatives include no chest pain, myalgias or shortness of breath. Risk factors for coronary artery disease include diabetes mellitus, dyslipidemia, hypertension, male sex, obesity and a sedentary lifestyle.  Hypertension  This is a chronic problem. The current episode started more than 1 year ago. The problem is controlled. Pertinent negatives include no chest pain, headaches, neck pain, palpitations or shortness of breath. Risk factors for coronary artery disease include diabetes mellitus, dyslipidemia, male gender, obesity, sedentary lifestyle and smoking/tobacco exposure. Hypertensive end-organ damage includes CAD/MI.    Review of Systems  Constitutional: Negative for chills, fatigue, fever and unexpected weight change.  HENT: Negative for dental problem, mouth sores and trouble swallowing.   Eyes: Negative for visual disturbance.  Respiratory: Negative for cough, choking, chest tightness, shortness of breath and wheezing.   Cardiovascular: Negative for chest pain, palpitations and leg swelling.  Gastrointestinal: Negative for abdominal distention, abdominal pain, constipation, diarrhea, nausea and vomiting.  Endocrine: Positive for polydipsia and polyuria. Negative for polyphagia.  Genitourinary: Negative for dysuria, flank pain, hematuria and urgency.   Musculoskeletal: Negative for back pain, gait problem, myalgias and neck pain.  Skin: Negative for pallor, rash and wound.  Neurological: Negative for seizures, syncope, weakness, numbness and headaches.  Psychiatric/Behavioral: Negative for confusion and dysphoric mood.    Objective:    BP 121/81   Pulse 92  Ht 6' 2"  (1.88 m)   Wt 241 lb (109.3 kg)   BMI 30.94 kg/m   Wt Readings from Last 3 Encounters:  08/07/18 241 lb (109.3 kg)  08/06/18 241 lb (109.3 kg)  08/05/18 240 lb 12.8 oz (109.2 kg)     Physical Exam  Constitutional: He is oriented to person, place, and time. He appears well-developed and well-nourished. He is cooperative. No distress.  HENT:  Head: Normocephalic and atraumatic.  Eyes: EOM are normal.  Neck: Normal range of motion. Neck supple. No tracheal deviation present. No thyromegaly present.  Cardiovascular: Normal rate, S1 normal, S2 normal and normal heart sounds. Exam reveals no gallop.  No murmur heard. Pulses:      Dorsalis pedis pulses are 1+ on the right side, and 1+ on the left side.       Posterior tibial pulses are 1+ on the right side, and 1+ on the left side.  Pulmonary/Chest: Breath sounds normal. No respiratory distress. He has no wheezes.  Abdominal: Soft. Bowel sounds are normal. He exhibits no distension. There is no tenderness. There is no guarding and no CVA tenderness.  Musculoskeletal: He exhibits no edema.       Right shoulder: He exhibits no swelling and no deformity.  Neurological: He is alert and oriented to person, place, and time. He has normal strength and normal reflexes. No cranial nerve deficit or sensory deficit. Gait normal.  Skin: Skin is warm and dry. No rash noted. No cyanosis. Nails show no clubbing.  Psychiatric: He has a normal mood and affect. His speech is normal. Judgment normal. Cognition and memory are normal.    CMP ( most recent) CMP     Component Value Date/Time   NA 136 07/12/2018 0025   NA 138  02/18/2018 1021   K 4.0 07/12/2018 0025   CL 102 07/12/2018 0025   CO2 20 (L) 07/12/2018 0025   GLUCOSE 225 (H) 07/12/2018 0025   BUN 20 07/12/2018 0025   BUN 14 02/18/2018 1021   CREATININE 0.98 07/12/2018 0025   CREATININE 0.96 12/07/2013 0950   CALCIUM 9.7 07/12/2018 0025   PROT 7.2 07/12/2018 0025   PROT 7.2 05/13/2018 0959   ALBUMIN 4.3 07/12/2018 0025   ALBUMIN 4.8 05/13/2018 0959   AST 29 07/12/2018 0025   ALT 41 07/12/2018 0025   ALKPHOS 41 07/12/2018 0025   BILITOT 1.0 07/12/2018 0025   BILITOT 0.7 05/13/2018 0959   GFRNONAA >60 07/12/2018 0025   GFRAA >60 07/12/2018 0025     Diabetic Labs (most recent): Lab Results  Component Value Date   HGBA1C 9.0 (H) 07/12/2018   HGBA1C 7.4 12/02/2017   HGBA1C 8.5 (H) 07/23/2017     Lipid Panel ( most recent) Lipid Panel     Component Value Date/Time   CHOL 172 07/12/2018 0713   CHOL 161 05/13/2018 0959   TRIG 1,157 (H) 07/12/2018 0713   HDL 27 (L) 07/12/2018 0713   HDL 32 (L) 05/13/2018 0959   CHOLHDL 6.4 07/12/2018 0713   VLDL UNABLE TO CALCULATE IF TRIGLYCERIDE OVER 400 mg/dL 07/12/2018 0713   LDLCALC UNABLE TO CALCULATE IF TRIGLYCERIDE OVER 400 mg/dL 07/12/2018 0713   LDLCALC 54 05/13/2018 0959      Lab Results  Component Value Date   TSH 0.525 07/12/2018   TSH 0.458 04/13/2017   TSH 0.493 02/05/2014      Assessment & Plan:   1. DM type 2 causing vascular disease ([place])  - [name] has  currently uncontrolled symptomatic type 2 DM since 57 years of age,  with most recent A1c of 9 %. Recent labs reviewed. -He returns with persistent severe hyperglycemia above 200 mg/dL averaging. -his diabetes is complicated by coronary artery disease, obesity/sedentary life, history of smoking and he remains at a high risk for more acute and chronic complications which include CAD, CVA, CKD, retinopathy, and neuropathy. These are all discussed in detail with him.  - I have counseled him on diet management and  weight loss, by adopting a carbohydrate restricted/protein rich diet.  -  Suggestion is made for him to avoid simple carbohydrates  from his diet including Cakes, Sweet Desserts / Pastries, Ice Cream, Soda (diet and regular), Sweet Tea, Candies, Chips, Cookies, Store Bought Juices, Alcohol in Excess of  1-2 drinks a day, Artificial Sweeteners, and "Sugar-free" Products. This will help patient to have stable blood glucose profile and potentially avoid unintended weight gain.   - I encouraged him to switch to  unprocessed or minimally processed complex starch and increased protein intake (animal or plant source), fruits, and vegetables.  - he is advised to stick to a routine mealtimes to eat 3 meals  a day and avoid unnecessary snacks ( to snack only to correct hypoglycemia).   - he will be scheduled with Jearld Fenton, RDN, CDE for individualized diabetes education.  - I have approached him with the following individualized plan to manage diabetes and patient agrees:   -Considering his prevailing glycemic burden as well as severe hypertriglyceridemia, he will benefit from insulin treatment.    -I discussed and initiated Tresiba 20 units subcutaneously nightly, associated with strict monitoring of glucose 4 times a day-before meals and at bedtime.  -Proper insulin use was demonstrated for him in the exam room by myself. - he is encouraged to call clinic for blood glucose levels less than 70 or above 200 mg /dl. - I will continue metformin 500 mg p.o. twice daily, therapeutically suitable for patient . -I advised him to continue glipizide 5 mg p.o. twice daily with breakfast and supper.  This medication will be discontinued once he is comfortable taking insulin.   -Due to severe hypertriglyceridemia,  he is not a suitable candidate for incretin therapy for now-insulin treatment would also help.   - Patient specific target  A1c;  LDL, HDL, Triglycerides, and  Waist Circumference were discussed in  detail.  2) BP/HTN:  his blood pressure is  controlled to target.   he is advised to continue his current medications including losartan 25 mg p.o. daily with breakfast . 3) Lipids/HPL:   Review of his recent lipid panel showed unknown LDL due to severe hypertriglyceridemia of 1157+. -He is advised to continue atorvastatin 40 mg p.o. nightly, he is also on Vascepa 2 g p.o. twice daily.  He is advised to be consistent on his medications.    4)  Weight/Diet:  Body mass index is 30.94 kg/m.  - clearly complicating his diabetes care.  I discussed with him the fact that loss of 5 - 10% of his  current body weight will have the most impact on his diabetes management.  CDE Consult will be initiated . Exercise, and detailed carbohydrates information provided  -  detailed on discharge instructions.  5) Chronic Care/Health Maintenance:  -he  is on ACEI/ARB and Statin medications and  is encouraged to initiate and continue to follow up with Ophthalmology, Dentist,  Podiatrist at least yearly or according to recommendations, and advised to  stay away from smoking. I have recommended yearly flu vaccine and pneumonia vaccine at least every 5 years; moderate intensity exercise for up to 150 minutes weekly; and  sleep for at least 7 hours a day.  - I advised patient to maintain close follow up with Mikey Kirschner, MD for primary care needs.  - Time spent with the patient: 25 min, of which >50% was spent in reviewing his blood glucose logs , discussing his hypo- and hyper-glycemic episodes, reviewing his current and  previous labs and insulin doses and developing a plan to avoid hypo- and hyper-glycemia. Please refer to Patient Instructions for Blood Glucose Monitoring and Insulin/Medications Dosing Guide"  in media tab for additional information. [name] participated in the discussions, expressed understanding, and voiced agreement with the above plans.  All questions were answered to his  satisfaction. he is encouraged to contact clinic should he have any questions or concerns prior to his return visit.   Follow up plan: - Return in about 2 weeks (around 08/21/2018) for Follow up with Meter and Logs Only - no Labs.  Glade Lloyd, MD [facility] [address] [place], [place] [address] Phone: [phone]  Fax: [phone]    08/07/2018, 3:26 PM  This note was partially dictated with voice recognition software. Similar sounding words can be transcribed inadequately or may not  be corrected upon review.

## 2018-08-13 ENCOUNTER — Other Ambulatory Visit: Payer: Self-pay

## 2018-08-13 MED ORDER — INSULIN DEGLUDEC 100 UNIT/ML ~~LOC~~ SOPN: 20.0000 [IU] | PEN_INJECTOR | Freq: Every day | SUBCUTANEOUS | 2 refills | Status: DC

## 2018-08-18 ENCOUNTER — Ambulatory Visit: Payer: Self-pay | Admitting: "Endocrinology

## 2018-08-18 ENCOUNTER — Encounter: Payer: Self-pay | Admitting: Nutrition

## 2018-08-18 ENCOUNTER — Encounter: Payer: PRIVATE HEALTH INSURANCE | Attending: Family Medicine | Admitting: Nutrition

## 2018-08-18 ENCOUNTER — Encounter: Payer: Self-pay | Admitting: "Endocrinology

## 2018-08-18 ENCOUNTER — Ambulatory Visit (INDEPENDENT_AMBULATORY_CARE_PROVIDER_SITE_OTHER): Payer: PRIVATE HEALTH INSURANCE | Admitting: "Endocrinology

## 2018-08-18 VITALS — BP 120/84 | HR 84 | Ht 74.0 in | Wt 245.0 lb

## 2018-08-18 DIAGNOSIS — E1159 Type 2 diabetes mellitus with other circulatory complications: Secondary | ICD-10-CM

## 2018-08-18 DIAGNOSIS — Z6831 Body mass index (BMI) 31.0-31.9, adult: Secondary | ICD-10-CM | POA: Insufficient documentation

## 2018-08-18 DIAGNOSIS — E782 Mixed hyperlipidemia: Secondary | ICD-10-CM | POA: Diagnosis not present

## 2018-08-18 DIAGNOSIS — I10 Essential (primary) hypertension: Secondary | ICD-10-CM | POA: Diagnosis not present

## 2018-08-18 DIAGNOSIS — Z713 Dietary counseling and surveillance: Secondary | ICD-10-CM | POA: Insufficient documentation

## 2018-08-18 MED ORDER — METFORMIN HCL 1000 MG PO TABS: 1000.0000 mg | ORAL_TABLET | Freq: Two times a day (BID) | ORAL | 2 refills | Status: DC

## 2018-08-18 MED ORDER — INSULIN DEGLUDEC 100 UNIT/ML ~~LOC~~ SOPN: 50.0000 [IU] | PEN_INJECTOR | Freq: Every day | SUBCUTANEOUS | 2 refills | Status: DC

## 2018-08-18 NOTE — Progress Notes (Signed)
MNT Follow up   800 am 830 am  Assessment:  Primary concerns today: Diabetes Type 2 x 15 yrs. Hypertriglyceridemia. Lives with wife and son .  Changes made: Drinking water, unsweet tea. Eating broiled and baked. Cut out fast foods. 30 units of Tresiba and  10 mg of GLipizide BID and Metformin 500 mg BID. Execise bike and walking for exercise.  To see Dr. Dorris Fetch today.  BS are still 200-300's consistently. He is compliant with medications. Has improve on his diet a lot. To see DR. Nida today. Wt up 3 lbs.  Lab Results  Component Value Date   HGBA1C 9.0 (H) 07/12/2018   CMP Latest Ref Rng & Units 07/12/2018 05/13/2018 02/18/2018  Glucose 70 - 99 mg/dL 225(H) - 248(H)  BUN 6 - 20 mg/dL 20 - 14  Creatinine 0.61 - 1.24 mg/dL 0.98 - 0.83  Sodium 135 - 145 mmol/L 136 - 138  Potassium 3.5 - 5.1 mmol/L 4.0 - 4.3  Chloride 98 - 111 mmol/L 102 - 98  CO2 22 - 32 mmol/L 20(L) - 23  Calcium 8.9 - 10.3 mg/dL 9.7 - 10.0  Total Protein 6.5 - 8.1 g/dL 7.2 7.2 -  Total Bilirubin 0.3 - 1.2 mg/dL 1.0 0.7 -  Alkaline Phos 38 - 126 U/L 41 44 -  AST 15 - 41 U/L 29 20 -  ALT 0 - 44 U/L 41 32 -   Lipid Panel     Component Value Date/Time   CHOL 172 07/12/2018 0713   CHOL 161 05/13/2018 0959   TRIG 1,157 (H) 07/12/2018 0713   HDL 27 (L) 07/12/2018 0713   HDL 32 (L) 05/13/2018 0959   CHOLHDL 6.4 07/12/2018 0713   VLDL UNABLE TO CALCULATE IF TRIGLYCERIDE OVER 400 mg/dL 07/12/2018 0713   LDLCALC UNABLE TO CALCULATE IF TRIGLYCERIDE OVER 400 mg/dL 07/12/2018 0713   LDLCALC 54 05/13/2018 0959      Preferred Learning Style:   No preference indicated   Learning Readiness:  Ready  Change in progress   MEDICATIONS:    DIETARY INTAKE:   24-hr recall:  B ( AM): egg whites, 1 slice bologna. And or oatmeal, apple Snk ( AM):  L ( PM):Turkey, or chicken, or salmon and salad and sweet potato , water, usnweet taSnk ( PM): D ( PM):   Meatloaf, pinto beans and potato salad, water Snk ( PM):   Beverages: water  Usual physical activity: walks some and exercise bike.  Estimated energy needs: 2000 calories 225 g carbohydrates 150 g protein 56 g fat  Progress Towards Goal(s):  In progress.   Nutritional Diagnosis:  NB-1.1 Food and nutrition-related knowledge deficit As related to Diabetes Type 2.  As evidenced by A1C 9%.    Intervention:  Nutrition and Diabetes education provided on My Plate, CHO counting, meal planning, portion sizes, timing of meals, avoiding snacks between meals unless having a low blood sugar, target ranges for A1C and blood sugars, signs/symptoms and treatment of hyper/hypoglycemia, monitoring blood sugars, taking medications as prescribed, benefits of exercising 30 minutes per day and prevention of complications of DM.  Goals 1. Keep exercising 30-60 minss 3-4 times a week. 2. Eat 45-60 carbs per meal 3. Get A1C to 7%.  Teaching Method Utilized:  Visual Auditory Hands on  Handouts given during visit include:  The Plate Method    Meal Plan Card   Barriers to learning/adherence to lifestyle change: none  Demonstrated degree of understanding via:  Teach Back   Monitoring/Evaluation:  Dietary intake, exercise, meal planing, and body weight in 3 months

## 2018-08-18 NOTE — Patient Instructions (Signed)

## 2018-08-18 NOTE — Patient Instructions (Addendum)
Goals 1. Keep exercising 30-60 minss 3-4 times a week. 2. Eat 45-60 carbs per meal 3. Get A1C to 7%.

## 2018-08-18 NOTE — Progress Notes (Signed)
Endocrinology follow-up Note       08/18/2018, 10:06 AM   Subjective:    Patient ID: Jared Griffin, male    DOB: April 22, 1961.  Cordell Guercio is being seen in consultation for management of currently uncontrolled symptomatic type 2 diabetes, hyperlipidemia, hypertension. PMD:  Mikey Kirschner, MD.   Past Medical History:  Diagnosis Date  . Anxiety   . 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 Coronary atherosclerosis of native coronary artery    a. 12/09/2013: s/p PCI with 3.0 x 28 Promus DES to mLAD  . Cyst (solitary) of breast 10/03/2017   removed from back  . Depression   . Elbow injury 07/25/2017   surgery for torn tendon  . Essential hypertension, benign   . Fatty liver disease, nonalcoholic   . Lateral epicondylitis of right elbow   . Mixed hyperlipidemia 2006  . Obesity   . Osgood-Schlatter's disease of right knee   . Type 2 diabetes mellitus ([place])    Past Surgical History:  Procedure Laterality Date  . CARDIAC CATHETERIZATION  2011  . CARDIAC CATHETERIZATION N/A 09/20/2015   Procedure: Left Heart Cath and Coronary Angiography;  Surgeon: [name], MD;  Location: [facility];  Service: Cardiovascular;  Laterality: N/A;  . CHOLECYSTECTOMY  2003  . COLONOSCOPY  06/19/2012   Procedure: COLONOSCOPY;  Surgeon: [name], MD;  [facility];  Service: Endoscopy;  Laterality: N/A;  930  . COLONOSCOPY N/A 10/17/2017   Procedure: COLONOSCOPY;  Surgeon: [name], MD;  [facility]: [facility];  Service: Endoscopy;  Laterality: N/A;  1225  . KNEE ARTHROPLASTY Right 1999  . LEFT HEART CATH AND CORONARY ANGIOGRAPHY N/A 02/26/2018   Procedure: LEFT HEART CATH AND CORONARY ANGIOGRAPHY;  Surgeon: [name], MD;  Location: [facility];  Service: Cardiovascular;  Laterality: N/A;  . LEFT HEART CATHETERIZATION WITH CORONARY  ANGIOGRAM N/A 12/09/2013   Procedure: LEFT HEART CATHETERIZATION WITH CORONARY ANGIOGRAM;  Surgeon: [name], MD;  [facility];  Service: Cardiovascular;  Laterality: N/A;  . Liver biopsy    . POLYPECTOMY  10/17/2017   Procedure: POLYPECTOMY;  Surgeon: [name], MD;  [facility];  Service: Endoscopy;;  ascending colon, splenic flexure,sigmoid x2  . TENNIS ELBOW RELEASE/NIRSCHEL PROCEDURE Right 07/25/2017   Procedure: TENNIS ELBOW RELEASE and debriedment;  Surgeon: [name], MD;  Location: [facility];  Service: Orthopedics;  Laterality: Right;  . TONSILLECTOMY  1970's   Social History   Socioeconomic History  . Marital status: Married    Spouse name: Not on file  . Number of children: Not on file  . Years of education: Not on file  . Highest education level: Not on file  Occupational History  . Not on file  Social Needs  . Financial resource strain: Not on file  . Food insecurity:    Worry: Not on file    Inability: Not on file  . Transportation needs:    Medical: Not on file    Non-medical: Not on file  Tobacco Use  .  Smoking status: Former Smoker    Packs/day: 0.50    Years: 10.00    Pack years: 5.00    Types: Cigarettes    Start date: 02/02/1973    Last attempt to quit: 09/17/1983    Years since quitting: 34.9  . Smokeless tobacco: Former Systems developer    Types: Burns date: 09/17/1983  Substance and Sexual Activity  . Alcohol use: No    Alcohol/week: 0.0 standard drinks    Comment: 11-30-15 per pt no  . Drug use: No    Comment: 11-30-15 per pt no  . Sexual activity: Yes  Lifestyle  . Physical activity:    Days per week: Not on file    Minutes per session: Not on file  . Stress: Not on file  Relationships  . Social connections:    Talks on phone: Not on file    Gets together: Not on file    Attends religious service: Not on file    Active member of club or organization: Not on file    Attends meetings of clubs or  organizations: Not on file    Relationship status: Not on file  Other Topics Concern  . 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    Married with 1 child   Right handed    12 th    Very little caffeine   Outpatient Encounter Medications as of 08/18/2018  Medication Sig  . ACCU-CHEK AVIVA PLUS test strip CHECK BLOOD SUGAR UP TO FOUR TIMES A DAY.  Marland Kitchen albuterol (PROVENTIL HFA;VENTOLIN HFA) 108 (90 Base) MCG/ACT inhaler Inhale 2 puffs into the lungs every 6 (six) hours as needed for wheezing or shortness of breath.  . ALPRAZolam (XANAX) 0.5 MG tablet TAKE (1) TABLET BY MOUTH (3) TIMES DAILY.  Marland Kitchen aspirin (ASPIR-LOW) 81 MG EC tablet Take 1 tablet (81 mg total) by mouth daily.  Marland Kitchen atorvastatin (LIPITOR) 40 MG tablet TAKE ONE (1) TABLET BY MOUTH EVERY DAY  . diltiazem (CARDIZEM) 30 MG tablet Take 1 tablet (30 mg total) by mouth 2 (two) times daily.  Marland Kitchen gabapentin (NEURONTIN) 300 MG capsule Take 600 mg by mouth 2 (two) times daily.   Marland Kitchen HYDROcodone-acetaminophen (NORCO) 5-325 MG tablet Take 1 tablet by mouth every 6 (six) hours as needed for moderate pain.  Vanessa Kick Ethyl (VASCEPA) 1 g CAPS Take 2 capsules (2 g total) by mouth 2 (two) times daily.  . insulin degludec (TRESIBA FLEXTOUCH) 100 UNIT/ML SOPN FlexTouch Pen Inject 0.5 mLs (50 Units total) into the skin at bedtime.  . Insulin Pen Needle (B-D ULTRAFINE III SHORT PEN) 31G X 8 MM MISC 1 each by Does not apply route as directed.  Marland Kitchen losartan (COZAAR) 25 MG tablet TAKE ONE (1) TABLET BY MOUTH EVERY DAY  . meclizine (ANTIVERT) 25 MG tablet Take 1 tablet (25 mg total) by mouth 3 (three) times daily as needed for dizziness.  . metFORMIN (GLUCOPHAGE) 1000 MG tablet Take 1 tablet (1,000 mg total) by mouth 2 (two) times daily with a meal.  . nitroGLYCERIN (NITROSTAT) 0.4 MG SL tablet Place 1 tablet (0.4 mg total) under the tongue every 5 (five) minutes as needed for  chest pain.  . pantoprazole (PROTONIX) 40 MG tablet Take one daily  . ranolazine (RANEXA) 500 MG 12 hr tablet TAKE (1) TABLET BY MOUTH TWICE DAILY.  Marland Kitchen sertraline (ZOLOFT) 100 MG tablet Take 1  tablet (100 mg total) by mouth daily.  . [DISCONTINUED] glipiZIDE (GLUCOTROL) 5 MG tablet Take 1 tablet (5 mg total) by mouth 2 (two) times daily with a meal. (Patient taking differently: Take 10 mg by mouth 2 (two) times daily with a meal. )  . [DISCONTINUED] insulin degludec (TRESIBA FLEXTOUCH) 100 UNIT/ML SOPN FlexTouch Pen Inject 0.2 mLs (20 Units total) into the skin at bedtime.  . [DISCONTINUED] metFORMIN (GLUCOPHAGE) 500 MG tablet Take 1 tablet (500 mg total) by mouth 2 (two) times daily with a meal. (Patient taking differently: Take 1,000 mg by mouth 2 (two) times daily with a meal. )   No facility-administered encounter medications on file as of 08/18/2018.     ALLERGIES: Allergies  Allergen Reactions  . Metoprolol Rash    VACCINATION STATUS: Immunization History  Administered Date(s) Administered  . Influenza,inj,Quad PF,6+ Mos 07/11/2016, 08/02/2017, 08/05/2018  . Influenza-Unspecified 06/18/2013, 06/17/2014, 07/06/2015  . Td 01/28/2014    Diabetes  He presents for his follow-up diabetic visit. He has type 2 diabetes mellitus. Onset time: He was diagnosed at approximate age of 31 years. His disease course has been worsening. There are no hypoglycemic associated symptoms. Pertinent negatives for hypoglycemia include no confusion, headaches, pallor or seizures. Associated symptoms include polydipsia and polyuria. Pertinent negatives for diabetes include no chest pain, no fatigue, no polyphagia and no weakness. There are no hypoglycemic complications. Symptoms are improving. Diabetic complications include heart disease. Risk factors for coronary artery disease include diabetes mellitus, dyslipidemia, hypertension, male sex, obesity, sedentary lifestyle and tobacco exposure. Current diabetic  treatment includes oral agent (dual therapy) (He is currently on Invokana 300 mg p.o. daily, metformin 500 mg p.o. twice daily, glipizide 5 mg p.o. twice daily.). His weight is stable. He is following a generally unhealthy diet. When asked about meal planning, he reported none. He has not had a previous visit with a dietitian. He rarely participates in exercise. His home blood glucose trend is decreasing steadily. His breakfast blood glucose range is generally >200 mg/dl. His lunch blood glucose range is generally >200 mg/dl. His dinner blood glucose range is generally >200 mg/dl. His bedtime blood glucose range is generally >200 mg/dl. His overall blood glucose range is >200 mg/dl. (He did bring his logs showing persistently above target glycemic profile averaging greater than 200 mg/dL.   His A1c was 9% on July 12, 2018.) An ACE inhibitor/angiotensin II receptor blocker is being taken. Eye exam is current.  Hyperlipidemia  This is a chronic problem. The problem is uncontrolled (He has severe hypertriglyceridemia at 1157.). Exacerbating diseases include diabetes and obesity. Pertinent negatives include no chest pain, myalgias or shortness of breath. Risk factors for coronary artery disease include diabetes mellitus, dyslipidemia, hypertension, male sex, obesity and a sedentary lifestyle.  Hypertension  This is a chronic problem. The current episode started more than 1 year ago. The problem is controlled. Pertinent negatives include no chest pain, headaches, neck pain, palpitations or shortness of breath. Risk factors for coronary artery disease include diabetes mellitus, dyslipidemia, male gender, obesity, sedentary lifestyle and smoking/tobacco exposure. Hypertensive end-organ damage includes CAD/MI.    Review of Systems  Constitutional: Negative for chills, fatigue, fever and unexpected weight change.  HENT: Negative for dental problem, mouth sores and trouble swallowing.   Eyes: Negative for visual  disturbance.  Respiratory: Negative for cough, choking, chest tightness, shortness of breath and wheezing.   Cardiovascular: Negative for chest pain, palpitations and leg swelling.  Gastrointestinal: Negative for abdominal distention, abdominal pain,  constipation, diarrhea, nausea and vomiting.  Endocrine: Positive for polydipsia and polyuria. Negative for polyphagia.  Genitourinary: Negative for dysuria, flank pain, hematuria and urgency.  Musculoskeletal: Negative for back pain, gait problem, myalgias and neck pain.  Skin: Negative for pallor, rash and wound.  Neurological: Negative for seizures, syncope, weakness, numbness and headaches.  Psychiatric/Behavioral: Negative for confusion and dysphoric mood.    Objective:    BP 120/84   Pulse 84   Ht 6' 2"  (1.88 m)   Wt 245 lb (111.1 kg)   BMI 31.46 kg/m   Wt Readings from Last 3 Encounters:  08/18/18 245 lb (111.1 kg)  08/18/18 245 lb (111.1 kg)  08/07/18 241 lb (109.3 kg)     Physical Exam  Constitutional: He is oriented to person, place, and time. He appears well-developed and well-nourished. He is cooperative. No distress.  HENT:  Head: Normocephalic and atraumatic.  Eyes: EOM are normal.  Neck: Normal range of motion. Neck supple. No tracheal deviation present. No thyromegaly present.  Cardiovascular: Normal rate, S1 normal, S2 normal and normal heart sounds. Exam reveals no gallop.  No murmur heard. Pulses:      Dorsalis pedis pulses are 1+ on the right side, and 1+ on the left side.       Posterior tibial pulses are 1+ on the right side, and 1+ on the left side.  Pulmonary/Chest: Breath sounds normal. No respiratory distress. He has no wheezes.  Abdominal: Soft. Bowel sounds are normal. He exhibits no distension. There is no tenderness. There is no guarding and no CVA tenderness.  Musculoskeletal: He exhibits no edema.       Right shoulder: He exhibits no swelling and no deformity.  Neurological: He is alert and  oriented to person, place, and time. He has normal strength and normal reflexes. No cranial nerve deficit or sensory deficit. Gait normal.  Skin: Skin is warm and dry. No rash noted. No cyanosis. Nails show no clubbing.  Psychiatric: He has a normal mood and affect. His speech is normal. Judgment normal. Cognition and memory are normal.   CMP     Component Value Date/Time   NA 136 07/12/2018 0025   NA 138 02/18/2018 1021   K 4.0 07/12/2018 0025   CL 102 07/12/2018 0025   CO2 20 (L) 07/12/2018 0025   GLUCOSE 225 (H) 07/12/2018 0025   BUN 20 07/12/2018 0025   BUN 14 02/18/2018 1021   CREATININE 0.98 07/12/2018 0025   CREATININE 0.96 12/07/2013 0950   CALCIUM 9.7 07/12/2018 0025   PROT 7.2 07/12/2018 0025   PROT 7.2 05/13/2018 0959   ALBUMIN 4.3 07/12/2018 0025   ALBUMIN 4.8 05/13/2018 0959   AST 29 07/12/2018 0025   ALT 41 07/12/2018 0025   ALKPHOS 41 07/12/2018 0025   BILITOT 1.0 07/12/2018 0025   BILITOT 0.7 05/13/2018 0959   GFRNONAA >60 07/12/2018 0025   GFRAA >60 07/12/2018 0025     Diabetic Labs (most recent): Lab Results  Component Value Date   HGBA1C 9.0 (H) 07/12/2018   HGBA1C 7.4 12/02/2017   HGBA1C 8.5 (H) 07/23/2017     Lipid Panel ( most recent) Lipid Panel     Component Value Date/Time   CHOL 172 07/12/2018 0713   CHOL 161 05/13/2018 0959   TRIG 1,157 (H) 07/12/2018 0713   HDL 27 (L) 07/12/2018 0713   HDL 32 (L) 05/13/2018 0959   CHOLHDL 6.4 07/12/2018 0713   VLDL UNABLE TO CALCULATE IF TRIGLYCERIDE OVER 400 mg/dL  07/12/2018 0713   Echo IF TRIGLYCERIDE OVER 400 mg/dL 07/12/2018 0713   LDLCALC 54 05/13/2018 0959      Lab Results  Component Value Date   TSH 0.525 07/12/2018   TSH 0.458 04/13/2017   TSH 0.493 02/05/2014      Assessment & Plan:   1. DM type 2 causing vascular disease ([place])  - [name] has currently uncontrolled symptomatic type 2 DM since 57 years of age,  with most recent A1c of 9 %.  Recent labs reviewed. -He returns with improved but still persistently above target glycemic profile averaging above 200 mg/dL.  -his diabetes is complicated by coronary artery disease, obesity/sedentary life, history of smoking and he remains at a high risk for more acute and chronic complications which include CAD, CVA, CKD, retinopathy, and neuropathy. These are all discussed in detail with him.  - I have counseled him on diet management and weight loss, by adopting a carbohydrate restricted/protein rich diet. -  Suggestion is made for him to avoid simple carbohydrates  from his diet including Cakes, Sweet Desserts / Pastries, Ice Cream, Soda (diet and regular), Sweet Tea, Candies, Chips, Cookies, Store Bought Juices, Alcohol in Excess of  1-2 drinks a day, Artificial Sweeteners, and "Sugar-free" Products. This will help patient to have stable blood glucose profile and potentially avoid unintended weight gain.  - I encouraged him to switch to  unprocessed or minimally processed complex starch and increased protein intake (animal or plant source), fruits, and vegetables.  - he is advised to stick to a routine mealtimes to eat 3 meals  a day and avoid unnecessary snacks ( to snack only to correct hypoglycemia).   - he will be scheduled with Jearld Fenton, RDN, CDE for individualized diabetes education.  - I have approached him with the following individualized plan to manage diabetes and patient agrees:   -Considering his prevailing glycemic burden as well as severe hypertriglyceridemia, he will benefit from continued insulin treatment. -He has room on titration of his basal insulin before considering prandial insulin.  -I discussed and increase his Tyler Aas to 50 units subcutaneously nightly, associated with strict monitoring of glucose 2 times a day-before breakfast  and at bedtime.  -Proper insulin use was demonstrated for him in the exam room by myself. - he is encouraged to call clinic for  blood glucose levels less than 70 or above 200 mg /dl. -He has tolerated metformin very well, I will increase  metformin 1000 mg p.o. twice daily, therapeutically suitable for patient . -He is advised to discontinue glipizide at this time.    -Due to severe hypertriglyceridemia,  he is not a suitable candidate for incretin therapy for now-insulin treatment would also help.   - Patient specific target  A1c;  LDL, HDL, Triglycerides, and  Waist Circumference were discussed in detail.  2) BP/HTN:  his blood pressure is  controlled to target.   he is advised to continue his current medications including losartan 25 mg p.o. daily with breakfast . 3) Lipids/HPL:   Review of his recent lipid panel showed unknown LDL due to severe hypertriglyceridemia of 1157+. -He is advised to continue atorvastatin 40 mg p.o. nightly,  he is also on Vascepa 2 g p.o. twice daily.  He is advised to be consistent on his medications.    4)  Weight/Diet:  Body mass index is 31.46 kg/m.  - clearly complicating his diabetes care.  I discussed with him the fact that loss  of 5 - 10% of his  current body weight will have the most impact on his diabetes management.  CDE Consult will be initiated . Exercise, and detailed carbohydrates information provided  -  detailed on discharge instructions.  5) Chronic Care/Health Maintenance:  -he  is on ACEI/ARB and Statin medications and  is encouraged to initiate and continue to follow up with Ophthalmology, Dentist,  Podiatrist at least yearly or according to recommendations, and advised to   stay away from smoking. I have recommended yearly flu vaccine and pneumonia vaccine at least every 5 years; moderate intensity exercise for up to 150 minutes weekly; and  sleep for at least 7 hours a day.  - I advised patient to maintain close follow up with Mikey Kirschner, MD for primary care needs.  - Time spent with the patient: 25 min, of which >50% was spent in reviewing his blood glucose  logs , discussing his hypo- and hyper-glycemic episodes, reviewing his current and  previous labs and insulin doses and developing a plan to avoid hypo- and hyper-glycemia. Please refer to Patient Instructions for Blood Glucose Monitoring and Insulin/Medications Dosing Guide"  in media tab for additional information. [name] participated in the discussions, expressed understanding, and voiced agreement with the above plans.  All questions were answered to his satisfaction. he is encouraged to contact clinic should he have any questions or concerns prior to his return visit.   Follow up plan: - Return in about 9 weeks (around 10/20/2018) for Meter, and Logs.  Glade Lloyd, MD [facility] [address] [place], [place] [address] Phone: [phone]  Fax: [phone]    08/18/2018, 10:06 AM  This note was partially dictated with voice recognition software. Similar sounding words can be transcribed inadequately or may not  be corrected upon review.

## 2018-08-19 ENCOUNTER — Ambulatory Visit (HOSPITAL_COMMUNITY): Payer: Self-pay | Admitting: Psychiatry

## 2018-08-22 ENCOUNTER — Ambulatory Visit (HOSPITAL_COMMUNITY): Payer: PRIVATE HEALTH INSURANCE | Admitting: Psychiatry

## 2018-08-28 ENCOUNTER — Other Ambulatory Visit: Payer: Self-pay | Admitting: Family Medicine

## 2018-08-29 ENCOUNTER — Telehealth: Payer: Self-pay

## 2018-08-29 NOTE — Telephone Encounter (Signed)
Advise to increase Tresiba to 70 units qhs.

## 2018-08-29 NOTE — Telephone Encounter (Signed)
Jared Griffin is calling stating that his blood sugars are still running high, please advise of any changes   Tues 12/10 Am-194 Pm-338  Wed 12/11 Am-258 Pm-318  Thurs 12/12 Am-201 Pm-247  Fri 12/13 Am-215

## 2018-09-01 ENCOUNTER — Ambulatory Visit (INDEPENDENT_AMBULATORY_CARE_PROVIDER_SITE_OTHER): Payer: PRIVATE HEALTH INSURANCE | Admitting: Family Medicine

## 2018-09-01 ENCOUNTER — Other Ambulatory Visit: Payer: Self-pay | Admitting: Family Medicine

## 2018-09-01 VITALS — BP 116/80 | Temp 98.6°F | Ht 74.0 in | Wt 243.0 lb

## 2018-09-01 DIAGNOSIS — J329 Chronic sinusitis, unspecified: Secondary | ICD-10-CM

## 2018-09-01 MED ORDER — AMOXICILLIN-POT CLAVULANATE 875-125 MG PO TABS: ORAL_TABLET | ORAL | 0 refills | Status: DC

## 2018-09-01 NOTE — Progress Notes (Signed)
   Subjective:    Patient ID: Jared Griffin, male    DOB: 11-01-1960, 57 y.o.   MRN: [id number]  Sinusitis  This is a new problem. Episode onset: 2 days. Associated symptoms include congestion, ear pain and headaches.    Bit stuffy sat  E yest hit ak at one   Eye rnning   Nasal cong  And stuffines   Left er hurting    Headache frontal in nature   achdy   No fever   Did get flu sot  Frontal h a , mostly worse with cogu +++++++++++++++++++++++++++++++++++++++++++++++                                                           Review of Systems  HENT: Positive for congestion and ear pain.   Neurological: Positive for headaches.       Objective:   Physical Exam  Alert, mild malaise. Hydration good Vitals stable. frontal/ maxillary tenderness evident positive nasal congestion. pharynx normal neck supple  lungs clear/no crackles or wheezes. heart regular in rhythm       Assessment & Plan:  Impression rhinosinusitis likely post viral, discussed with patient. plan antibiotics prescribed. Questions answered. Symptomatic care discussed. warning signs discussed. WSL

## 2018-09-03 ENCOUNTER — Other Ambulatory Visit: Payer: Self-pay | Admitting: "Endocrinology

## 2018-09-08 ENCOUNTER — Other Ambulatory Visit: Payer: Self-pay | Admitting: *Deleted

## 2018-09-08 ENCOUNTER — Telehealth: Payer: Self-pay | Admitting: Family Medicine

## 2018-09-08 MED ORDER — DOXYCYCLINE HYCLATE 100 MG PO TABS: 100.0000 mg | ORAL_TABLET | Freq: Two times a day (BID) | ORAL | 0 refills | Status: DC

## 2018-09-08 NOTE — Telephone Encounter (Signed)
Left message to return call to get more info

## 2018-09-08 NOTE — Telephone Encounter (Signed)
Pt.notified

## 2018-09-08 NOTE — Telephone Encounter (Signed)
Head congestion traveled into chest. Pt was seen 09/01/2018 for head congestion, pt would like another medication called in if possible. Advise.   Pharmacy:  [facility], [place]

## 2018-09-08 NOTE — Telephone Encounter (Signed)
Seen 12/16. Prescribed augmentin. Pt states head is clear but congestion has moved down to chest. Coughing up thick clear mucus. No sob, no fever.

## 2018-09-08 NOTE — Telephone Encounter (Signed)
Med sent to pharm. Left message to return call

## 2018-09-08 NOTE — Telephone Encounter (Signed)
Doxy 100 bid ten d

## 2018-09-09 ENCOUNTER — Telehealth: Payer: Self-pay | Admitting: Family Medicine

## 2018-09-09 MED ORDER — OSELTAMIVIR PHOSPHATE 75 MG PO CAPS: 75.0000 mg | ORAL_CAPSULE | Freq: Two times a day (BID) | ORAL | 0 refills | Status: DC

## 2018-09-09 NOTE — Telephone Encounter (Signed)
Prescription sent electronically to pharmacy. Wife(DPR) notified.

## 2018-09-09 NOTE — Telephone Encounter (Signed)
Please advise 

## 2018-09-09 NOTE — Telephone Encounter (Signed)
tamiflu 75 bid five d

## 2018-09-09 NOTE — Telephone Encounter (Signed)
Pt's wife calling on behalf on pt, pt was prescribed doxycycline (VIBRA-TABS) 100 MG tablet yesterday to still having congested/sinus, from last night to this morning pt has had a cough and fever 101 and is requesting a prescription for tamiflu called in due to son being seen at urgent care yesterday and diagnosed with flu.   Pt's wife states pharmacy closes @ 3pm today.  Kathryne Eriksson On DPR   Pharmacy:  [facility], [place]

## 2018-09-15 ENCOUNTER — Other Ambulatory Visit: Payer: Self-pay

## 2018-09-15 MED ORDER — INSULIN DEGLUDEC 100 UNIT/ML ~~LOC~~ SOPN: 50.0000 [IU] | PEN_INJECTOR | Freq: Every day | SUBCUTANEOUS | 2 refills | Status: DC

## 2018-09-18 ENCOUNTER — Telehealth: Payer: Self-pay | Admitting: "Endocrinology

## 2018-09-18 MED ORDER — INSULIN DETEMIR 100 UNIT/ML FLEXPEN: 70.0000 [IU] | PEN_INJECTOR | Freq: Every day | SUBCUTANEOUS | 2 refills | Status: DC

## 2018-09-18 NOTE — Telephone Encounter (Signed)
Tyler Aas changed to Levemir

## 2018-09-19 ENCOUNTER — Telehealth: Payer: Self-pay

## 2018-09-19 ENCOUNTER — Other Ambulatory Visit: Payer: Self-pay | Admitting: "Endocrinology

## 2018-09-19 MED ORDER — INSULIN GLARGINE (1 UNIT DIAL) 300 UNIT/ML ~~LOC~~ SOPN: 70.0000 [IU] | PEN_INJECTOR | Freq: Every day | SUBCUTANEOUS | 2 refills | Status: DC

## 2018-09-19 NOTE — Telephone Encounter (Signed)
We will switch to Toujeo, same dose.

## 2018-09-19 NOTE — Telephone Encounter (Signed)
Jared Griffin is calling stating that the Levamir that was called in is going to cost him $1000.00 and his insurance is advising him to go to either Toujeo, Humalin, Humalog or Lantus, please advise?

## 2018-09-22 NOTE — Telephone Encounter (Signed)
Patient is aware of the recommendation

## 2018-09-23 ENCOUNTER — Other Ambulatory Visit: Payer: Self-pay

## 2018-09-23 MED ORDER — LANCETS MISC: 1.0000 | Freq: Four times a day (QID) | 5 refills | Status: DC

## 2018-09-23 MED ORDER — GLUCOSE BLOOD VI STRP: 1.0000 | ORAL_STRIP | Freq: Four times a day (QID) | 5 refills | Status: DC

## 2018-09-26 ENCOUNTER — Encounter (HOSPITAL_COMMUNITY): Payer: Self-pay | Admitting: Psychiatry

## 2018-09-26 ENCOUNTER — Ambulatory Visit (INDEPENDENT_AMBULATORY_CARE_PROVIDER_SITE_OTHER): Payer: PRIVATE HEALTH INSURANCE | Admitting: Psychiatry

## 2018-09-26 VITALS — BP 120/83 | HR 96 | Ht 74.0 in | Wt 241.0 lb

## 2018-09-26 DIAGNOSIS — F321 Major depressive disorder, single episode, moderate: Secondary | ICD-10-CM | POA: Diagnosis not present

## 2018-09-26 MED ORDER — SERTRALINE HCL 100 MG PO TABS: 100.0000 mg | ORAL_TABLET | Freq: Every day | ORAL | 2 refills | Status: DC

## 2018-09-26 MED ORDER — CLONAZEPAM 0.5 MG PO TABS: 0.5000 mg | ORAL_TABLET | Freq: Three times a day (TID) | ORAL | 2 refills | Status: DC | PRN

## 2018-09-26 NOTE — Progress Notes (Signed)
BH MD/PA/NP OP Progress Note  09/26/2018 9:09 AM Jared Griffin  MRN:  [id number]  Chief Complaint:  Chief Complaint    Depression; Anxiety; Follow-up     HPI: This patient is a 58 year old married white male lives with his wife and 105 year old son in [place]. 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 zone  The patient was referred by his primary physician, Dr. [name], for further assessment and treatment of anxiety and depression.  The patient states that he is become increasingly depressed and anxious over the last couple of years due to work stress. He stated that he was always under pressure to get more things done than he had time to do. 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 He got to the point of hating going to work. Several years ago he had cardiac problems and had a stent placed. In January of this year he began developing dizzy spells chest pain headaches stomach aches. It got so bad that he was readmitted for cardiac catheterization but nothing new was found.  The patient was having difficulty sleeping constant worry and anxiety about his job sadness and anger. He was getting irritable with people at home. After talking to his primary care physician at length and decided to take him out on medical retirement. The patient has been on Wellbutrin SR 150 no gram twice a day for several weeks now. He had also been out in the past when his wife was sick. He has not seen any benefit from this but does feel tremendously better since he stopped working. He is also on Xanax 0.5 mg 3 times a day which he thinks has helped to some degree.  Since stopping working he's had a big turnaround. His sleep and energy have improved. 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 He is much less anxious and is no longer having the chest pain dizzy spells or stomachaches. He denies suicidal ideation or psychotic symptoms. He does not use drugs or alcohol  Patient returns after 4 months.  He missed an appointment because his best friend died of lung cancer.  He states back in October he was having a very difficult time with this and he began having dizziness and chest pain.  He was admitted to [facility] and had a cardiac work-up which was negative.  His blood sugar was quite high and this was thought to be part of the cause along with anxiety.  He is doing better now.  However he does not think the Xanax really helps his anxiety much anymore.  I suggested rather than going up on it we try a different medication like clonazepam and he is willing to do this.  He states for a while after his friend died he stayed in the bed all the time but now he is up and about and doing things.  He still having a lot of pain from his elbow injury and is unable to work and trying to fight with [employer].  He denies any thoughts of self-harm or suicide.  He is generally sleeping fairly well. Visit Diagnosis:    ICD-10-CM   1. Moderate single current episode of major depressive disorder ([place]) F32.1     Past Psychiatric History:  none  Past Medical History:  Past Medical History:  Diagnosis Date  . Anxiety   . 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 Coronary atherosclerosis of native coronary artery    a. 12/09/2013: s/p PCI with 3.0 x 28 Promus DES to mLAD  . Cyst (solitary) of breast 10/03/2017   removed from back  . Depression   . Elbow injury 07/25/2017   surgery for torn tendon  . Essential hypertension, benign   . Fatty liver disease, nonalcoholic   . Lateral epicondylitis of right elbow   . Mixed hyperlipidemia 2006  . Obesity   . Osgood-Schlatter's disease of right knee   . Type 2 diabetes mellitus ([place])     Past Surgical History:   Procedure Laterality Date  . CARDIAC CATHETERIZATION  2011  . CARDIAC CATHETERIZATION N/A 09/20/2015   Procedure: Left Heart Cath and Coronary Angiography;  Surgeon: [name], MD;  Location: [place];  Service: Cardiovascular;  Laterality: N/A;  . CHOLECYSTECTOMY  2003  . COLONOSCOPY  06/19/2012   Procedure: COLONOSCOPY;  Surgeon: [name], MD;  [facility];  Service: Endoscopy;  Laterality: N/A;  930  . COLONOSCOPY N/A 10/17/2017   Procedure: COLONOSCOPY;  Surgeon: [name], MD;  Location: [facility];  Service: Endoscopy;  Laterality: N/A;  1225  . KNEE ARTHROPLASTY Right 1999  . LEFT HEART CATH AND CORONARY ANGIOGRAPHY N/A 02/26/2018   Procedure: LEFT HEART CATH AND CORONARY ANGIOGRAPHY;  Surgeon: [name], MD;  Location: [place] [facility];  Service: Cardiovascular;  Laterality: N/A;  . LEFT HEART CATHETERIZATION WITH CORONARY ANGIOGRAM N/A 12/09/2013   Procedure: LEFT HEART CATHETERIZATION WITH CORONARY ANGIOGRAM;  Surgeon: [name], MD;  Location: [facility] CATH LAB;  Service: Cardiovascular;  Laterality: N/A;  . Liver biopsy    . POLYPECTOMY  10/17/2017   Procedure: POLYPECTOMY;  Surgeon: [name], MD;  [facility]: [facility];  Service: Endoscopy;;  ascending colon, splenic flexure,sigmoid x2  . TENNIS ELBOW RELEASE/NIRSCHEL PROCEDURE Right 07/25/2017   Procedure: TENNIS ELBOW RELEASE and debriedment;  Surgeon: [name], MD;  Location: [facility];  Service: Orthopedics;  Laterality: Right;  . TONSILLECTOMY  1970's    Family Psychiatric History: none  Family History:  Family History  Problem Relation Age of Onset  . Cancer Maternal Aunt   . CVA Maternal Grandmother   . CVA Maternal Grandfather     Social History:  Social History   Socioeconomic History  . Marital status: Married    Spouse name: Not on file  . Number of children: Not on file  . Years of education: Not on file  . Highest  education level: Not on file  Occupational History  . Not on file  Social Needs  . Financial resource strain: Not on file  . Food insecurity:    Worry: Not on file    Inability: Not on file  . Transportation needs:    Medical: Not on file    Non-medical: Not on file  Tobacco Use  . Smoking status: Former Smoker    Packs/day: 0.50    Years: 10.00    Pack years: 5.00    Types: Cigarettes    Start date: 02/02/1973    Last attempt to quit: 09/17/1983    Years since quitting: 35.0  . Smokeless tobacco: Former Systems developer    Types: [place] date: 09/17/1983  Substance and Sexual Activity  .  Alcohol use: No    Alcohol/week: 0.0 standard drinks    Comment: 11-30-15 per pt no  . Drug use: No    Comment: 11-30-15 per pt no  . Sexual activity: Yes  Lifestyle  . Physical activity:    Days per week: Not on file    Minutes per session: Not on file  . Stress: Not on file  Relationships  . Social connections:    Talks on phone: Not on file    Gets together: Not on file    Attends religious service: Not on file    Active member of club or organization: Not on file    Attends meetings of clubs or organizations: Not on file    Relationship status: Not on file  Other Topics Concern  . 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    Married with 1 child   Right handed    12 th    Very little caffeine    Allergies:  Allergies  Allergen Reactions  . Metoprolol Rash    Metabolic Disorder Labs: Lab Results  Component Value Date   HGBA1C 9.0 (H) 07/12/2018   MPG 211.6 07/12/2018   MPG 197.25 07/23/2017   No results found for: PROLACTIN Lab Results  Component Value Date   CHOL 172 07/12/2018   TRIG 1,157 (H) 07/12/2018   HDL 27 (L) 07/12/2018   CHOLHDL 6.4 07/12/2018   VLDL UNABLE TO CALCULATE IF TRIGLYCERIDE OVER 400 mg/dL 07/12/2018   LDLCALC UNABLE TO CALCULATE IF TRIGLYCERIDE OVER 400 mg/dL  07/12/2018   LDLCALC 54 05/13/2018   Lab Results  Component Value Date   TSH 0.525 07/12/2018   TSH 0.458 04/13/2017    Therapeutic Level Labs: No results found for: LITHIUM No results found for: VALPROATE No components found for:  CBMZ  Current Medications: Current Outpatient Medications  Medication Sig Dispense Refill  . albuterol (PROVENTIL HFA;VENTOLIN HFA) 108 (90 Base) MCG/ACT inhaler Inhale 2 puffs into the lungs every 6 (six) hours as needed for wheezing or shortness of breath. 1 Inhaler 2  . aspirin (ASPIR-LOW) 81 MG EC tablet Take 1 tablet (81 mg total) by mouth daily. 30 tablet 12  . atorvastatin (LIPITOR) 40 MG tablet TAKE ONE (1) TABLET BY MOUTH EVERY DAY 90 tablet 3  . diltiazem (CARDIZEM) 30 MG tablet Take 1 tablet (30 mg total) by mouth 2 (two) times daily. 180 tablet 1  . gabapentin (NEURONTIN) 300 MG capsule Take 600 mg by mouth 2 (two) times daily.     Mar[place] Kitchen glucose blood test strip 1 each by Other route 4 (four) times daily. Use as instructed qid. E11.65. One touch Ultra 150 each 5  . HYDROcodone-acetaminophen (NORCO) 5-325 MG tablet Take 1 tablet by mouth every 6 (six) hours as needed for moderate pain. 24 tablet 0  . Icosapent Ethyl (VASCEPA) 1 g CAPS Take 2 capsules (2 g total) by mouth 2 (two) times daily. 360 capsule 3  . Insulin Glargine, 1 Unit Dial, (TOUJEO SOLOSTAR) 300 UNIT/ML SOPN Inject 70 Units into the skin at bedtime. 6 pen 2  . Insulin Pen Needle (B-D ULTRAFINE III SHORT PEN) 31G X 8 MM MISC 1 each by Does not apply route as directed. 50 each 3  . Lancets MISC 1 each by Does not apply route 4 (four) times daily. 150 each 5  . losartan (COZAAR) 25 MG tablet TAKE ONE (1)  TABLET BY MOUTH EVERY DAY 90 tablet 1  . meclizine (ANTIVERT) 25 MG tablet Take 1 tablet (25 mg total) by mouth 3 (three) times daily as needed for dizziness. 30 tablet 0  . metFORMIN (GLUCOPHAGE) 1000 MG tablet Take 1 tablet (1,000 mg total) by mouth 2 (two) times daily with a meal. 60  tablet 2  . metFORMIN (GLUCOPHAGE) 500 MG tablet TAKE 2 TABLETS IN THE MORNING, 1 TABLET AT NOON, AND 2 TABLETS IN THEEVENING. 450 tablet 0  . nitroGLYCERIN (NITROSTAT) 0.4 MG SL tablet Place 1 tablet (0.4 mg total) under the tongue every 5 (five) minutes as needed for chest pain. 25 tablet 3  . pantoprazole (PROTONIX) 40 MG tablet Take one daily 90 tablet 1  . ranolazine (RANEXA) 500 MG 12 hr tablet TAKE (1) TABLET BY MOUTH TWICE DAILY. 60 tablet 6  . sertraline (ZOLOFT) 100 MG tablet Take 1 tablet (100 mg total) by mouth daily. 90 tablet 2  . amoxicillin-clavulanate (AUGMENTIN) 875-125 MG tablet One p o bid for ten d 20 tablet 0  . clonazePAM (KLONOPIN) 0.5 MG tablet Take 1 tablet (0.5 mg total) by mouth 3 (three) times daily as needed for anxiety. 90 tablet 2  . doxycycline (VIBRA-TABS) 100 MG tablet Take 1 tablet (100 mg total) by mouth 2 (two) times daily. 20 tablet 0  . oseltamivir (TAMIFLU) 75 MG capsule Take 1 capsule (75 mg total) by mouth 2 (two) times daily. 10 capsule 0   No current facility-administered medications for this visit.      Musculoskeletal: Strength & Muscle Tone: within normal limits Gait & Station: normal Patient leans: N/A  Psychiatric Specialty Exam: Review of Systems  Musculoskeletal: Positive for joint pain.  Psychiatric/Behavioral: The patient is nervous/anxious.   All other systems reviewed and are negative.   Blood pressure 120/83, pulse 96, height 6' 2"  (1.88 m), weight 241 lb (109.3 kg), SpO2 97 %.Body mass index is 30.94 kg/m.  General Appearance: Casual, Neat and Well Groomed  Eye Contact:  Good  Speech:  Clear and Coherent  Volume:  Normal  Mood:  Anxious  Affect:  Congruent  Thought Process:  Goal Directed  Orientation:  Full (Time, Place, and Person)  Thought Content: Rumination   Suicidal Thoughts:  No  Homicidal Thoughts:  No  Memory:  Immediate;   Good Recent;   Good Remote;   Good  Judgement:  Good  Insight:  Good  Psychomotor  Activity:  Normal  Concentration:  Concentration: Good and Attention Span: Good  Recall:  Good  Fund of Knowledge: Good  Language: Good  Akathisia:  No  Handed:  Right  AIMS (if indicated): not done  Assets:  Communication Skills Desire for Improvement Resilience Social Support Talents/Skills  ADL's:  Intact  Cognition: WNL  Sleep:  Good   Screenings: GAD-7     Office Visit from 08/05/2018 in [place]  Total GAD-7 Score  5    PHQ2-9     Nutrition from 08/06/2018 in [facility] Office Visit from 08/05/2018 in [facility] Visit from 07/24/2018 in [facility] Office Visit from 12/02/2017 in [facility] Visit from 08/02/2017 in [facility]  PHQ-2 Total Score  0  1  0  0  0  PHQ-9 Total Score  -  3  -  1  -       Assessment and Plan: This patient is a 58 year old male with a history of depression  and anxiety.  Zoloft 100 mg daily continues to help his depression.  However Xanax is not working so it will be discontinued in favor of clonazepam 0.5 mg 3 times daily as needed.  He will return to see me in 3 months   [name], MD 09/26/2018, 9:09 AM

## 2018-10-03 ENCOUNTER — Other Ambulatory Visit: Payer: Self-pay | Admitting: Cardiovascular Disease

## 2018-10-07 DIAGNOSIS — Z029 Encounter for administrative examinations, unspecified: Secondary | ICD-10-CM

## 2018-10-08 ENCOUNTER — Ambulatory Visit (INDEPENDENT_AMBULATORY_CARE_PROVIDER_SITE_OTHER): Payer: Worker's Compensation | Admitting: Orthopedic Surgery

## 2018-10-08 ENCOUNTER — Encounter: Payer: Self-pay | Admitting: Orthopedic Surgery

## 2018-10-08 VITALS — BP 138/84 | HR 109 | Ht 74.0 in | Wt 242.0 lb

## 2018-10-08 DIAGNOSIS — R251 Tremor, unspecified: Secondary | ICD-10-CM | POA: Diagnosis not present

## 2018-10-08 DIAGNOSIS — M7711 Lateral epicondylitis, right elbow: Secondary | ICD-10-CM | POA: Diagnosis not present

## 2018-10-08 DIAGNOSIS — M25521 Pain in right elbow: Secondary | ICD-10-CM

## 2018-10-08 DIAGNOSIS — G5631 Lesion of radial nerve, right upper limb: Secondary | ICD-10-CM | POA: Diagnosis not present

## 2018-10-08 DIAGNOSIS — G8929 Other chronic pain: Secondary | ICD-10-CM

## 2018-10-08 NOTE — Progress Notes (Signed)
Chief Complaint  Patient presents with  . Arm Pain    Right arm    Encounter Diagnoses  Name Primary?  . Lateral epicondylitis of right elbow s/p release 07/25/17 Yes  . 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 Chronic elbow pain, right   . Tremor of unknown origin     58 year old male had an uncomplicated lateral epicondylar release right elbow in November 2018.  Complains of persistent right elbow pain was evaluated with a nerve study no damage seen.  Patient has worsening tremor right upper extremity unknown origin.  He had some tremor at the time of his index procedure but it has worsened  His [employer] have not been responding to our letters  We will send him a certified letter recommending a second opinion neurology consult and advised him that he is not to work until this is done  His examination today shows a well-healed incision with tenderness over the incision he has full range of motion pronation supination flexion extension weakness with wrist extension no instability of the elbow normal sensation in the right upper extremity with normal pulse perfusion in this tremor which is unexplained  Encounter Diagnoses  Name Primary?  . Lateral epicondylitis of right elbow s/p release 07/25/17 Yes  . 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 Chronic elbow pain, right   . Tremor of unknown origin     Follow-up in 3 months

## 2018-10-08 NOTE — Patient Instructions (Signed)
NO WORK X 12 MONTHS

## 2018-10-09 ENCOUNTER — Telehealth: Payer: Self-pay | Admitting: Orthopedic Surgery

## 2018-10-09 NOTE — Telephone Encounter (Addendum)
I have contacted Worker's comp, Su Hoff; verified per Junie Panning in Customer Service ph# [phone] that patient's adjuster is still Durenda Hurt, direct ph# [phone]; fax# [phone]. Awaiting physical address in order to send certified letter with return signature of receipt; given [address] # only at this time ([address], [place], [place] [address]).  Have also called (left voice message) and faxed office notes of 10/08/2018 andwork status note regarding patient's care, to adjuster.  Patient aware.

## 2018-10-09 NOTE — Telephone Encounter (Signed)
-----   Message from [name], MD sent at 10/08/2018 10:09 AM EST ----- Arbie Cookey need to send a certified letter requesting signature of receipt on this patient regarding his care

## 2018-10-13 ENCOUNTER — Other Ambulatory Visit: Payer: Self-pay | Admitting: Family Medicine

## 2018-10-16 NOTE — Telephone Encounter (Signed)
Have contacted other phone #'s for Jared Griffin in order to obtain correct physical address, as unable to send certified letter/return receipt to [address] address - most recently left message at [phone], tried options 1 and 5; left message.

## 2018-10-18 ENCOUNTER — Other Ambulatory Visit: Payer: Self-pay | Admitting: "Endocrinology

## 2018-10-20 ENCOUNTER — Encounter: Payer: Self-pay | Admitting: Nutrition

## 2018-10-20 ENCOUNTER — Encounter: Payer: Self-pay | Admitting: "Endocrinology

## 2018-10-20 ENCOUNTER — Encounter: Payer: PRIVATE HEALTH INSURANCE | Attending: Family Medicine | Admitting: Nutrition

## 2018-10-20 ENCOUNTER — Ambulatory Visit (INDEPENDENT_AMBULATORY_CARE_PROVIDER_SITE_OTHER): Payer: PRIVATE HEALTH INSURANCE | Admitting: "Endocrinology

## 2018-10-20 VITALS — BP 135/83 | HR 82 | Ht 74.0 in | Wt 249.0 lb

## 2018-10-20 DIAGNOSIS — E1159 Type 2 diabetes mellitus with other circulatory complications: Secondary | ICD-10-CM | POA: Insufficient documentation

## 2018-10-20 DIAGNOSIS — Z6831 Body mass index (BMI) 31.0-31.9, adult: Secondary | ICD-10-CM | POA: Insufficient documentation

## 2018-10-20 DIAGNOSIS — Z713 Dietary counseling and surveillance: Secondary | ICD-10-CM | POA: Diagnosis not present

## 2018-10-20 DIAGNOSIS — I10 Essential (primary) hypertension: Secondary | ICD-10-CM

## 2018-10-20 DIAGNOSIS — E782 Mixed hyperlipidemia: Secondary | ICD-10-CM | POA: Diagnosis not present

## 2018-10-20 LAB — COMPREHENSIVE METABOLIC PANEL
ALT: 34 IU/L (ref 0–44)
AST: 21 IU/L (ref 0–40)
Albumin/Globulin Ratio: 2 (ref 1.2–2.2)
Albumin: 4.5 g/dL (ref 3.8–4.9)
Alkaline Phosphatase: 46 IU/L (ref 39–117)
BUN/Creatinine Ratio: 14 (ref 9–20)
BUN: 13 mg/dL (ref 6–24)
Bilirubin Total: 0.8 mg/dL (ref 0.0–1.2)
CO2: 22 mmol/L (ref 20–29)
Calcium: 9.4 mg/dL (ref 8.7–10.2)
Chloride: 100 mmol/L (ref 96–106)
Creatinine, Ser: 0.94 mg/dL (ref 0.76–1.27)
GFR calc Af Amer: 104 mL/min/{1.73_m2} (ref >59)
GFR calc non Af Amer: 90 mL/min/{1.73_m2} (ref >59)
Globulin, Total: 2.2 g/dL (ref 1.5–4.5)
Glucose: 174 mg/dL — ABNORMAL HIGH (ref 65–99)
Potassium: 4.2 mmol/L (ref 3.5–5.2)
Sodium: 140 mmol/L (ref 134–144)
Total Protein: 6.7 g/dL (ref 6.0–8.5)

## 2018-10-20 LAB — LIPID PANEL
Chol/HDL Ratio: 3.9 ratio (ref 0.0–5.0)
Cholesterol, Total: 116 mg/dL (ref 100–199)
HDL: 30 mg/dL — ABNORMAL LOW (ref >39)
LDL Calculated: 51 mg/dL (ref 0–99)
Triglycerides: 173 mg/dL — ABNORMAL HIGH (ref 0–149)
VLDL Cholesterol Cal: 35 mg/dL (ref 5–40)

## 2018-10-20 LAB — HGB A1C W/O EAG: Hgb A1c MFr Bld: 9 % — ABNORMAL HIGH (ref 4.8–5.6)

## 2018-10-20 LAB — AMMONIA: Ammonia: 41 ug/dL (ref 27–102)

## 2018-10-20 LAB — SPECIMEN STATUS REPORT

## 2018-10-20 MED ORDER — INSULIN GLARGINE (1 UNIT DIAL) 300 UNIT/ML ~~LOC~~ SOPN: 80.0000 [IU] | PEN_INJECTOR | Freq: Every day | SUBCUTANEOUS | 2 refills | Status: DC

## 2018-10-20 NOTE — Patient Instructions (Addendum)
Goals  Keep up the good job and the changes you already have made.  Continue to eat balanced meals Exercise as you can. Drink only water

## 2018-10-20 NOTE — Progress Notes (Signed)
Endocrinology follow-up Note       10/20/2018, 9:44 AM   Subjective:    Patient ID: Jared Griffin, male    DOB: Aug 02, 1961.  Jared Griffin is being seen in follow-up  for management of currently uncontrolled symptomatic type 2 diabetes, hyperlipidemia, hypertension. PMD:  Mikey Kirschner, MD.   Past Medical History:  Diagnosis Date  . Anxiety   . 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 Coronary atherosclerosis of native coronary artery    a. 12/09/2013: s/p PCI with 3.0 x 28 Promus DES to mLAD  . Cyst (solitary) of breast 10/03/2017   removed from back  . Depression   . Elbow injury 07/25/2017   surgery for torn tendon  . Essential hypertension, benign   . Fatty liver disease, nonalcoholic   . Lateral epicondylitis of right elbow   . Mixed hyperlipidemia 2006  . Obesity   . Osgood-Schlatter's disease of right knee   . Type 2 diabetes mellitus ([place])    Past Surgical History:  Procedure Laterality Date  . CARDIAC CATHETERIZATION  2011  . CARDIAC CATHETERIZATION N/A 09/20/2015   Procedure: Left Heart Cath and Coronary Angiography;  Surgeon: [name], MD;  Location: [facility];  Service: Cardiovascular;  Laterality: N/A;  . CHOLECYSTECTOMY  2003  . COLONOSCOPY  06/19/2012   Procedure: COLONOSCOPY;  Surgeon: [name], MD;  Location: [facility];  Service: Endoscopy;  Laterality: N/A;  930  . COLONOSCOPY N/A 10/17/2017   Procedure: COLONOSCOPY;  Surgeon: [name], MD;  Location: [facility];  Service: Endoscopy;  Laterality: N/A;  1225  . KNEE ARTHROPLASTY Right 1999  . LEFT HEART CATH AND CORONARY ANGIOGRAPHY N/A 02/26/2018   Procedure: LEFT HEART CATH AND CORONARY ANGIOGRAPHY;  Surgeon: [name], MD;  Location: [facility];  Service: Cardiovascular;  Laterality: N/A;  . LEFT HEART CATHETERIZATION WITH CORONARY  ANGIOGRAM N/A 12/09/2013   Procedure: LEFT HEART CATHETERIZATION WITH CORONARY ANGIOGRAM;  Surgeon: [name], MD;  Location: [facility];  Service: Cardiovascular;  Laterality: N/A;  . Liver biopsy    . POLYPECTOMY  10/17/2017   Procedure: POLYPECTOMY;  Surgeon: [name], MD;  [facility]: [facility];  Service: Endoscopy;;  ascending colon, splenic flexure,sigmoid x2  . TENNIS ELBOW RELEASE/NIRSCHEL PROCEDURE Right 07/25/2017   Procedure: TENNIS ELBOW RELEASE and debriedment;  Surgeon: [name], MD;  Location: [facility];  Service: Orthopedics;  Laterality: Right;  . TONSILLECTOMY  1970's   Social History   Socioeconomic History  . Marital status: Married    Spouse name: Not on file  . Number of children: Not on file  . Years of education: Not on file  . Highest education level: Not on file  Occupational History  . Not on file  Social Needs  . Financial resource strain: Not on file  . Food insecurity:    Worry: Not on file    Inability: Not on file  . Transportation needs:    Medical: Not on file    Non-medical: Not on file  Tobacco Use  .  Smoking status: Former Smoker    Packs/day: 0.50    Years: 10.00    Pack years: 5.00    Types: Cigarettes    Start date: 02/02/1973    Last attempt to quit: 09/17/1983    Years since quitting: 35.1  . Smokeless tobacco: Former Systems developer    Types: [place] date: 09/17/1983  Substance and Sexual Activity  . Alcohol use: No    Alcohol/week: 0.0 standard drinks    Comment: 11-30-15 per pt no  . Drug use: No    Comment: 11-30-15 per pt no  . Sexual activity: Yes  Lifestyle  . Physical activity:    Days per week: Not on file    Minutes per session: Not on file  . Stress: Not on file  Relationships  . Social connections:    Talks on phone: Not on file    Gets together: Not on file    Attends religious service: Not on file    Active member of club or organization: Not on file    Attends meetings of clubs or  organizations: Not on file    Relationship status: Not on file  Other Topics Concern  . 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    Married with 1 child   Right handed    95 th    Very little caffeine   Outpatient Encounter Medications as of 10/20/2018  Medication Sig  . [DISCONTINUED] metFORMIN (GLUCOPHAGE) 1000 MG tablet Take 1 tablet (1,000 mg total) by mouth 2 (two) times daily with a meal.  . albuterol (PROVENTIL HFA;VENTOLIN HFA) 108 (90 Base) MCG/ACT inhaler Inhale 2 puffs into the lungs every 6 (six) hours as needed for wheezing or shortness of breath.  Marland Kitchen aspirin (ASPIR-LOW) 81 MG EC tablet Take 1 tablet (81 mg total) by mouth daily.  Marland Kitchen atorvastatin (LIPITOR) 40 MG tablet TAKE ONE (1) TABLET BY MOUTH EVERY DAY  . clonazePAM (KLONOPIN) 0.5 MG tablet Take 1 tablet (0.5 mg total) by mouth 3 (three) times daily as needed for anxiety.  Marland Kitchen diltiazem (CARDIZEM) 30 MG tablet Take 1 tablet (30 mg total) by mouth 2 (two) times daily.  Marland Kitchen gabapentin (NEURONTIN) 300 MG capsule TAKE 1 CAPSULE BY MOUTH FOUR TIMES DAILY.  Marland Kitchen glucose blood test strip 1 each by Other route 4 (four) times daily. Use as instructed qid. E11.65. One touch Ultra  . HYDROcodone-acetaminophen (NORCO) 5-325 MG tablet Take 1 tablet by mouth every 6 (six) hours as needed for moderate pain.  Vanessa Kick Ethyl (VASCEPA) 1 g CAPS Take 2 capsules (2 g total) by mouth 2 (two) times daily.  . Insulin Glargine, 1 Unit Dial, (TOUJEO SOLOSTAR) 300 UNIT/ML SOPN Inject 80 Units into the skin at bedtime.  . Insulin Pen Needle (B-D ULTRAFINE III SHORT PEN) 31G X 8 MM MISC 1 each by Does not apply route as directed.  . Lancets MISC 1 each by Does not apply route 4 (four) times daily.  Marland Kitchen losartan (COZAAR) 25 MG tablet TAKE ONE (1) TABLET BY MOUTH EVERY DAY  . meclizine (ANTIVERT) 25 MG tablet Take 1 tablet (25 mg total) by mouth 3 (three) times daily as needed  for dizziness.  . metFORMIN (GLUCOPHAGE) 500 MG tablet TAKE 2 TABLETS IN THE MORNING, 1 TABLET AT NOON, AND 2 TABLETS IN THEEVENING.  . nitroGLYCERIN (NITROSTAT) 0.4 MG SL tablet Place 1 tablet (0.4  mg total) under the tongue every 5 (five) minutes as needed for chest pain.  . pantoprazole (PROTONIX) 40 MG tablet Take one daily  . ranolazine (RANEXA) 500 MG 12 hr tablet TAKE (1) TABLET BY MOUTH TWICE DAILY.  Marland Kitchen sertraline (ZOLOFT) 100 MG tablet Take 1 tablet (100 mg total) by mouth daily.  . [DISCONTINUED] amoxicillin-clavulanate (AUGMENTIN) 875-125 MG tablet One p o bid for ten d (Patient not taking: Reported on 10/08/2018)  . [DISCONTINUED] doxycycline (VIBRA-TABS) 100 MG tablet Take 1 tablet (100 mg total) by mouth 2 (two) times daily. (Patient not taking: Reported on 10/08/2018)  . [DISCONTINUED] Insulin Glargine, 1 Unit Dial, (TOUJEO SOLOSTAR) 300 UNIT/ML SOPN Inject 70 Units into the skin at bedtime.  . [DISCONTINUED] oseltamivir (TAMIFLU) 75 MG capsule Take 1 capsule (75 mg total) by mouth 2 (two) times daily. (Patient not taking: Reported on 10/08/2018)   No facility-administered encounter medications on file as of 10/20/2018.     ALLERGIES: Allergies  Allergen Reactions  . Metoprolol Rash    VACCINATION STATUS: Immunization History  Administered Date(s) Administered  . Influenza,inj,Quad PF,6+ Mos 07/11/2016, 08/02/2017, 08/05/2018  . Influenza-Unspecified 06/18/2013, 06/17/2014, 07/06/2015  . Td 01/28/2014    Diabetes  He presents for his follow-up diabetic visit. He has type 2 diabetes mellitus. Onset time: He was diagnosed at approximate age of 84 years. His disease course has been worsening. There are no hypoglycemic associated symptoms. Pertinent negatives for hypoglycemia include no confusion, headaches, pallor or seizures. Associated symptoms include polydipsia and polyuria. Pertinent negatives for diabetes include no chest pain, no fatigue, no polyphagia and no weakness. There  are no hypoglycemic complications. Symptoms are improving. Diabetic complications include heart disease. Risk factors for coronary artery disease include diabetes mellitus, dyslipidemia, hypertension, male sex, obesity, sedentary lifestyle and tobacco exposure. Current diabetic treatment includes oral agent (dual therapy) (He is currently on Invokana 300 mg p.o. daily, metformin 500 mg p.o. twice daily, glipizide 5 mg p.o. twice daily.). His weight is stable. He is following a generally unhealthy diet. When asked about meal planning, he reported none. He has not had a previous visit with a dietitian. He rarely participates in exercise. His home blood glucose trend is decreasing steadily. His breakfast blood glucose range is generally >200 mg/dl. His lunch blood glucose range is generally >200 mg/dl. His dinner blood glucose range is generally >200 mg/dl. His bedtime blood glucose range is generally >200 mg/dl. His overall blood glucose range is >200 mg/dl. (He did bring his logs showing persistently above target glycemic profile averaging greater than 200 mg/dL.   His A1c was 9% on July 12, 2018.) An ACE inhibitor/angiotensin II receptor blocker is being taken. Eye exam is current.  Hyperlipidemia  This is a chronic problem. The problem is uncontrolled (He has severe hypertriglyceridemia at 1157.). Exacerbating diseases include diabetes and obesity. Pertinent negatives include no chest pain, myalgias or shortness of breath. Risk factors for coronary artery disease include diabetes mellitus, dyslipidemia, hypertension, male sex, obesity and a sedentary lifestyle.  Hypertension  This is a chronic problem. The current episode started more than 1 year ago. The problem is controlled. Pertinent negatives include no chest pain, headaches, neck pain, palpitations or shortness of breath. Risk factors for coronary artery disease include diabetes mellitus, dyslipidemia, male gender, obesity, sedentary lifestyle and  smoking/tobacco exposure. Hypertensive end-organ damage includes CAD/MI.    Review of Systems  Constitutional: Negative for chills, fatigue, fever and unexpected weight change.  HENT: Negative for dental problem, mouth sores  and trouble swallowing.   Eyes: Negative for visual disturbance.  Respiratory: Negative for cough, choking, chest tightness, shortness of breath and wheezing.   Cardiovascular: Negative for chest pain, palpitations and leg swelling.  Gastrointestinal: Negative for abdominal distention, abdominal pain, constipation, diarrhea, nausea and vomiting.  Endocrine: Positive for polydipsia and polyuria. Negative for polyphagia.  Genitourinary: Negative for dysuria, flank pain, hematuria and urgency.  Musculoskeletal: Negative for back pain, gait problem, myalgias and neck pain.  Skin: Negative for pallor, rash and wound.  Neurological: Negative for seizures, syncope, weakness, numbness and headaches.  Psychiatric/Behavioral: Negative for confusion and dysphoric mood.    Objective:    BP 135/83   Pulse 82   Ht 6' 2"  (1.88 m)   Wt 249 lb (112.9 kg)   BMI 31.97 kg/m   Wt Readings from Last 3 Encounters:  10/20/18 249 lb (112.9 kg)  10/20/18 249 lb (112.9 kg)  10/08/18 242 lb (109.8 kg)     Physical Exam Constitutional:      General: He is not in acute distress.    Appearance: He is well-developed.  HENT:     Head: Normocephalic and atraumatic.  Neck:     Musculoskeletal: Normal range of motion and neck supple.     Thyroid: No thyromegaly.     Trachea: No tracheal deviation.  Cardiovascular:     Rate and Rhythm: Normal rate.     Pulses:          Dorsalis pedis pulses are 1+ on the right side and 1+ on the left side.       Posterior tibial pulses are 1+ on the right side and 1+ on the left side.     Heart sounds: Normal heart sounds, S1 normal and S2 normal. No murmur. No gallop.   Pulmonary:     Effort: No respiratory distress.     Breath sounds: Normal  breath sounds. No wheezing.  Abdominal:     General: Bowel sounds are normal. There is no distension.     Palpations: Abdomen is soft.     Tenderness: There is no abdominal tenderness. There is no guarding.  Musculoskeletal:     Right shoulder: He exhibits no swelling and no deformity.  Skin:    General: Skin is warm and dry.     Findings: No rash.     Nails: There is no clubbing.   Neurological:     Mental Status: He is Griffin and oriented to person, place, and time.     Cranial Nerves: No cranial nerve deficit.     Sensory: No sensory deficit.     Gait: Gait normal.     Deep Tendon Reflexes: Reflexes are normal and symmetric.  Psychiatric:        Speech: Speech normal.        Behavior: Behavior is cooperative.        Judgment: Judgment normal.    CMP     Component Value Date/Time   NA 136 07/12/2018 0025   NA 138 02/18/2018 1021   K 4.0 07/12/2018 0025   CL 102 07/12/2018 0025   CO2 20 (L) 07/12/2018 0025   GLUCOSE 225 (H) 07/12/2018 0025   BUN 20 07/12/2018 0025   BUN 14 02/18/2018 1021   CREATININE 0.98 07/12/2018 0025   CREATININE 0.96 12/07/2013 0950   CALCIUM 9.7 07/12/2018 0025   PROT 7.2 07/12/2018 0025   PROT 7.2 05/13/2018 0959   ALBUMIN 4.3 07/12/2018 0025   ALBUMIN 4.8 05/13/2018  0959   AST 29 07/12/2018 0025   ALT 41 07/12/2018 0025   ALKPHOS 41 07/12/2018 0025   BILITOT 1.0 07/12/2018 0025   BILITOT 0.7 05/13/2018 0959   GFRNONAA >60 07/12/2018 0025   GFRAA >60 07/12/2018 0025     Diabetic Labs (most recent): Lab Results  Component Value Date   HGBA1C 9.0 (H) 07/12/2018   HGBA1C 7.4 12/02/2017   HGBA1C 8.5 (H) 07/23/2017     Lipid Panel ( most recent) Lipid Panel     Component Value Date/Time   CHOL 116 10/18/2018 0903   TRIG 173 (H) 10/18/2018 0903   HDL 30 (L) 10/18/2018 0903   CHOLHDL 3.9 10/18/2018 0903   CHOLHDL 6.4 07/12/2018 0713   VLDL UNABLE TO CALCULATE IF TRIGLYCERIDE OVER 400 mg/dL 07/12/2018 0713   LDLCALC 51 10/18/2018  0903      Lab Results  Component Value Date   TSH 0.525 07/12/2018   TSH 0.458 04/13/2017   TSH 0.493 02/05/2014      Assessment & Plan:   1. DM type 2 causing vascular disease ([place])  - Jared Griffin has currently uncontrolled symptomatic type 2 DM since 58 years of age. -He is returning with improving average blood glucose, his previsit labs are not ready to review except his renal function which is normal.  His A1c during his last visit was 9%.   -his diabetes is complicated by coronary artery disease, obesity/sedentary life, history of smoking and he remains at a high risk for more acute and chronic complications which include CAD, CVA, CKD, retinopathy, and neuropathy. These are all discussed in detail with him.  - I have counseled him on diet management and weight loss, by adopting a carbohydrate restricted/protein rich diet.  - Patient admits there is a room for improvement in his diet and drink choices. -  Suggestion is made for him to avoid simple carbohydrates  from his diet including Cakes, Sweet Desserts / Pastries, Ice Cream, Soda (diet and regular), Sweet Tea, Candies, Chips, Cookies, Store Bought Juices, Alcohol in Excess of  1-2 drinks a day, Artificial Sweeteners, and "Sugar-free" Products. This will help patient to have stable blood glucose profile and potentially avoid unintended weight gain.   - I encouraged him to switch to  unprocessed or minimally processed complex starch and increased protein intake (animal or plant source), fruits, and vegetables.  - he is advised to stick to a routine mealtimes to eat 3 meals  a day and avoid unnecessary snacks ( to snack only to correct hypoglycemia).   - he will be scheduled with Jearld Fenton, RDN, CDE for individualized diabetes education.  - I have approached him with the following individualized plan to manage diabetes and patient agrees:   -He will continue to benefit from basal insulin at a higher dose. I  discussed and increased his Tresiba to 80 units subcutaneously nightly.  He is advised to continue monitoring blood glucose  2 times a day-before breakfast  and at bedtime.   - he is encouraged to call clinic for blood glucose levels less than 70 or above 200 mg /dl. -He has tolerated metformin very well.  He is advised to continue  metformin 1000 mg p.o. twice daily, therapeutically suitable for patient . -He is advised to discontinue glipizide at this time.    -Due to severe hypertriglyceridemia,  he is not a suitable candidate for incretin therapy for now-insulin treatment would also help.   - Patient specific target  A1c;  LDL, HDL, Triglycerides, and  Waist Circumference were discussed in detail.  2) BP/HTN:  his blood pressure is controlled to target.   he is advised to continue his current medications including losartan 25 mg p.o. daily with breakfast . 3) Lipids/HPL:   Review of his recent lipid panel showed unknown LDL due to severe hypertriglyceridemia of 1157+. -He is advised to continue atorvastatin 40 mg p.o. nightly,  he is also on Vascepa 2 g p.o. twice daily.  He is advised to be consistent on his medications.    4)  Weight/Diet:  Body mass index is 31.97 kg/m.  - clearly complicating his diabetes care.  I discussed with him the fact that loss of 5 - 10% of his  current body weight will have the most impact on his diabetes management.  CDE Consult will be initiated . Exercise, and detailed carbohydrates information provided  -  detailed on discharge instructions.  5) Chronic Care/Health Maintenance:  -he  is on ACEI/ARB and Statin medications and  is encouraged to initiate and continue to follow up with Ophthalmology, Dentist,  Podiatrist at least yearly or according to recommendations, and advised to   stay away from smoking. I have recommended yearly flu vaccine and pneumonia vaccine at least every 5 years; moderate intensity exercise for up to 150 minutes weekly; and  sleep for  at least 7 hours a day.  - I advised patient to maintain close follow up with Mikey Kirschner, MD for primary care needs.  - Time spent with the patient: 25 min, of which >50% was spent in reviewing his blood glucose logs , discussing his hypoglycemia and hyperglycemia episodes, reviewing his current and  previous labs / studies and medications  doses and developing a plan to avoid hypoglycemia and hyperglycemia. Please refer to Patient Instructions for Blood Glucose Monitoring and Insulin/Medications Dosing Guide"  in media tab for additional information. Jared Griffin participated in the discussions, expressed understanding, and voiced agreement with the above plans.  All questions were answered to his satisfaction. he is encouraged to contact clinic should he have any questions or concerns prior to his return visit.   Follow up plan: - Return in about 3 months (around 01/18/2019) for Meter, and Logs.  Glade Lloyd, MD [facility] [address] [place], [place] [address] Phone: [phone]  Fax: [phone]    10/20/2018, 9:44 AM  This note was partially dictated with voice recognition software. Similar sounding words can be transcribed inadequately or may not  be corrected upon review.

## 2018-10-20 NOTE — Patient Instructions (Signed)

## 2018-10-20 NOTE — Progress Notes (Signed)
MNT Follow up   800 am 830 am  Assessment:  Primary concerns today: Diabetes Type 2 x 15 yrs. Hypertriglyceridemia.TG are much improved: down to 173 mg/dl.  Lives with wife and son .  Here to see Dr. Dorris Fetch today also. Weight up 7 lbs.  He notes he has some swelling in his feet and gets SOB easilty. HIstory of some COPD issues.  Changes made: has been trying to walk. He has cut way back on his milk intake, and processed foods. Eats more fresh fruits, vegetables. Food baked and broiled. Uses Mrs. Dash.  He notes he is having issues with his breathing. Gets SOB at times and has an inhaler with prednisone.  Limited exercise due to breathing issues. Doesn't have much of an appetite. Has been having some swelling in his feet.  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 Berline Lopes.Marland Kitchen  Hasnt been getting injections since las July. BS 150-upper 200's.  Toujeo 70 units  And Metformin 1000 mg BID. Trying to ride exercise. Bike. Had the flu over christmas. Lipid profile  TG better. .   Waiting on A1C results.  . Lipid Panel     Component Value Date/Time   CHOL 116 10/18/2018 0903   TRIG 173 (H) 10/18/2018 0903   HDL 30 (L) 10/18/2018 0903   CHOLHDL 3.9 10/18/2018 0903   CHOLHDL 6.4 07/12/2018 0713   VLDL UNABLE TO CALCULATE IF TRIGLYCERIDE OVER 400 mg/dL 07/12/2018 0713   LDLCALC 51 10/18/2018 0903      Preferred Learning Style:   No preference indicated   Learning Readiness:  Ready  Change in progress   MEDICATIONS:    DIETARY INTAKE:   24-hr recall:  B ( AM): egg whites, ww toast or oatmeal Snk ( AM):  L ( PM):TD ( PM):  Eating [place] on wheat bread, some fruit, water D) Seafood or chicken, vegetables.green beans, water. Snk ( PM):  Beverages: water  Usual physical activity: walks some and exercise bike.  Estimated energy needs: 2000 calories 225 g carbohydrates 150 g protein 56 g fat  Progress Towards Goal(s):  In  progress.   Nutritional Diagnosis:  NB-1.1 Food and nutrition-related knowledge deficit As related to Diabetes Type 2.  As evidenced by A1C 9%.    Intervention:  Nutrition and Diabetes education provided on My Plate, CHO counting, meal planning, portion sizes, timing of meals, avoiding snacks between meals unless having a low blood sugar, target ranges for A1C and blood sugars, signs/symptoms and treatment of hyper/hypoglycemia, monitoring blood sugars, taking medications as prescribed, benefits of exercising 30 minutes per day and prevention of complications of DM.  Goals  Keep up the good job and the changes you already have made.  Continue to eat balanced meals Exercise as you can. Drink only water  Teaching Method Utilized:  Visual Auditory Hands on  Handouts given during visit include:  The Plate Method    Meal Plan Card   Barriers to learning/adherence to lifestyle change: none  Demonstrated degree of understanding via:  Teach Back   Monitoring/Evaluation:  Dietary intake, exercise, meal planing, and body weight in 3 months

## 2018-10-22 ENCOUNTER — Ambulatory Visit: Payer: PRIVATE HEALTH INSURANCE | Admitting: Family Medicine

## 2018-10-22 VITALS — BP 132/84 | Ht 74.0 in | Wt 243.0 lb

## 2018-10-22 DIAGNOSIS — M25562 Pain in left knee: Secondary | ICD-10-CM

## 2018-10-22 DIAGNOSIS — J4540 Moderate persistent asthma, uncomplicated: Secondary | ICD-10-CM | POA: Diagnosis not present

## 2018-10-22 MED ORDER — FLUTICASONE PROPIONATE HFA 44 MCG/ACT IN AERO: 2.0000 | INHALATION_SPRAY | Freq: Two times a day (BID) | RESPIRATORY_TRACT | 5 refills | Status: DC

## 2018-10-22 NOTE — Progress Notes (Signed)
   Subjective:    Patient ID: Jared Griffin, male    DOB: 12/15/1960, 58 y.o.   MRN: [id number]  HPI    Patient arrives with left leg pain for 5 days. Patient reports no know injury but it is pain in the top of the foot and shoots up to the knee area.   Pain fairly sevre at times   Woke up with it five nights ago   Recalls no injury   strted  Dorsum of lega now lateral left leg   Has been walking more, arm somewhat uncomforta ble     Took tylenol pn    Few hycrodocone  Patient was concerned about his leg pain in terms of what may be causing it.  Patient also notes ongoing challenges with reactive airways.  His inhaler.  Fairly frequent.  Daily in nature.  Accompanied by cough.  Review of Systems No headache, no major weight loss or weight gain, no chest pain no back pain abdominal pain no change in bowel habits complete ROS otherwise negative     Objective:   Physical Exam Alert and oriented, vitals reviewed and stable, NAD ENT-TM's and ext canals WNL bilat via otoscopic exam Soft palate, tonsils and post pharynx WNL via oropharyngeal exam Neck-symmetric, no masses; thyroid nonpalpable and nontender Pulmonary-no tachypnea or accessory muscle use; Clear without wheezes via auscultation Card--no abnrml murmurs, rhythm reg and rate WNL Carotid pulses symmetric, without bruits Left leg pulses excellent no edema negative Homans sign.  Negative calf tenderness.  Pain actually confined more in the region of the knee.  Lateral tendon insertion tenderness.  No effusion some posterior knee pain to deep palpation       Assessment & Plan:  Impression 1 knee strain.  Symptom care discussed.  Warning signs discussed.  2.  Worsening reactive airways.  Now frequent symptomatology.  Now by definition at moderate persistent asthma level.  Ongoing need for inhaler.  Will add Flovent rationale discussed.  Nearly daily cough.

## 2018-10-25 DIAGNOSIS — J4540 Moderate persistent asthma, uncomplicated: Secondary | ICD-10-CM | POA: Insufficient documentation

## 2018-10-27 ENCOUNTER — Ambulatory Visit (HOSPITAL_COMMUNITY)
Admission: RE | Admit: 2018-10-27 | Discharge: 2018-10-27 | Disposition: A | Discharge status: Home / Self Care | Payer: PRIVATE HEALTH INSURANCE | Source: Ambulatory Visit | Attending: Family Medicine | Admitting: Family Medicine

## 2018-10-27 ENCOUNTER — Telehealth: Payer: Self-pay | Admitting: Family Medicine

## 2018-10-27 DIAGNOSIS — M25562 Pain in left knee: Secondary | ICD-10-CM

## 2018-10-27 NOTE — Telephone Encounter (Signed)
Patient is aware of all and order has been placed.

## 2018-10-27 NOTE — Telephone Encounter (Signed)
Seen 2/5

## 2018-10-27 NOTE — Telephone Encounter (Signed)
Left Leg feels like it is getting worse and would like to get an xray ordered.

## 2018-10-27 NOTE — Telephone Encounter (Signed)
Left knee xray

## 2018-10-28 ENCOUNTER — Other Ambulatory Visit: Payer: Self-pay | Admitting: Family Medicine

## 2018-10-28 DIAGNOSIS — M25562 Pain in left knee: Secondary | ICD-10-CM

## 2018-11-03 ENCOUNTER — Ambulatory Visit: Payer: PRIVATE HEALTH INSURANCE | Admitting: Orthopedic Surgery

## 2018-11-03 NOTE — Telephone Encounter (Signed)
Continued: per Corrin Parker, patient was contacted by third party with workers comp insurer: appointment is scheduled with Dr Suan Halter, orthopaedic hand specialist; ph#[phone], 11/12/2018, at 11:00am

## 2018-11-03 NOTE — Telephone Encounter (Signed)
Patient called to relay that he has been scheduled for appointment through his worker's comp, for date 11/12/2018, 11:00am, with Dr Gwynneth Aliment, [place].

## 2018-11-10 ENCOUNTER — Ambulatory Visit (INDEPENDENT_AMBULATORY_CARE_PROVIDER_SITE_OTHER): Payer: PRIVATE HEALTH INSURANCE

## 2018-11-10 ENCOUNTER — Encounter: Payer: Self-pay | Admitting: Orthopedic Surgery

## 2018-11-10 ENCOUNTER — Ambulatory Visit: Payer: PRIVATE HEALTH INSURANCE | Admitting: Orthopedic Surgery

## 2018-11-10 VITALS — BP 130/88 | HR 92 | Ht 74.0 in | Wt 248.0 lb

## 2018-11-10 DIAGNOSIS — M25552 Pain in left hip: Secondary | ICD-10-CM | POA: Diagnosis not present

## 2018-11-10 DIAGNOSIS — M48061 Spinal stenosis, lumbar region without neurogenic claudication: Secondary | ICD-10-CM | POA: Diagnosis not present

## 2018-11-10 DIAGNOSIS — M5410 Radiculopathy, site unspecified: Secondary | ICD-10-CM

## 2018-11-10 MED ORDER — MELOXICAM 7.5 MG PO TABS: 7.5000 mg | ORAL_TABLET | Freq: Every day | ORAL | 0 refills | Status: DC

## 2018-11-10 MED ORDER — METHOCARBAMOL 500 MG PO TABS: 500.0000 mg | ORAL_TABLET | Freq: Three times a day (TID) | ORAL | 1 refills | Status: DC

## 2018-11-10 NOTE — Progress Notes (Signed)
NEWPROBLEM//OFFICE VISIT  Chief Complaint  Patient presents with  . Leg Pain    Left leg x 4 weeks     58 year old male says he was lying in bed about 4 weeks ago dorsiflex his foot felt acute pain running up the back of his leg and since that time is had increasing pain dorsum of the foot lateral part of the lower leg lateral knee radiating up into the lower back area.  He says he has had some issues and discomfort in the lower back area for several years and was thinking about going to the doctor to check on it.  He did see his primary care doctor he said his knee was hurting they x-rayed his knee he said he had arthritis on the x-ray and was sent here for evaluation of that  However, his pain is when he is lying down he does find standing up he is not sleeping at night and his symptoms are more consistent with radicular pain  He is on gabapentin sertraline and Norco for other reasons he is allergic to metoprolol and he is a diabetic     Review of Systems  Constitutional: Negative for chills, fever, malaise/fatigue and weight loss.  Gastrointestinal: Negative for constipation.       Denies loss bowel control   Genitourinary:       Denies urinary retention or los of bladder control      Past Medical History:  Diagnosis Date  . Anxiety   . 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 Coronary atherosclerosis of native coronary artery    a. 12/09/2013: s/p PCI with 3.0 x 28 Promus DES to mLAD  . Cyst (solitary) of breast 10/03/2017   removed from back  . Depression   . Elbow injury 07/25/2017   surgery for torn tendon  . Essential hypertension, benign   . Fatty liver disease, nonalcoholic   . Lateral epicondylitis of right elbow   . Mixed hyperlipidemia 2006  . Obesity   . Osgood-Schlatter's disease of right knee   . Type 2 diabetes mellitus ([place])     Past Surgical History:  Procedure Laterality Date  . CARDIAC CATHETERIZATION  2011  . CARDIAC CATHETERIZATION  N/A 09/20/2015   Procedure: Left Heart Cath and Coronary Angiography;  Surgeon: [name], MD;  Location: [facility];  Service: Cardiovascular;  Laterality: N/A;  . CHOLECYSTECTOMY  2003  . COLONOSCOPY  06/19/2012   Procedure: COLONOSCOPY;  Surgeon: [name], MD;  [facility]: [facility];  Service: Endoscopy;  Laterality: N/A;  930  . COLONOSCOPY N/A 10/17/2017   Procedure: COLONOSCOPY;  Surgeon: [name], MD;  [facility]: [facility];  Service: Endoscopy;  Laterality: N/A;  1225  . KNEE ARTHROPLASTY Right 1999  . LEFT HEART CATH AND CORONARY ANGIOGRAPHY N/A 02/26/2018   Procedure: LEFT HEART CATH AND CORONARY ANGIOGRAPHY;  Surgeon: [name], MD;  Location: [facility];  Service: Cardiovascular;  Laterality: N/A;  . LEFT HEART CATHETERIZATION WITH CORONARY ANGIOGRAM N/A 12/09/2013   Procedure: LEFT HEART CATHETERIZATION WITH CORONARY ANGIOGRAM;  Surgeon: [name], MD;  Location: [facility];  Service: Cardiovascular;  Laterality: N/A;  . Liver biopsy    . POLYPECTOMY  10/17/2017   Procedure: POLYPECTOMY;  Surgeon: [name], MD;  [facility]: [facility];  Service: Endoscopy;;  ascending colon, splenic flexure,sigmoid x2  . TENNIS ELBOW RELEASE/NIRSCHEL PROCEDURE Right 07/25/2017   Procedure: TENNIS ELBOW  RELEASE and debriedment;  Surgeon: [name], MD;  Location: [facility];  Service: Orthopedics;  Laterality: Right;  . TONSILLECTOMY  1970's    Family History  Problem Relation Age of Onset  . Cancer Maternal Aunt   . CVA Maternal Grandmother   . CVA Maternal Grandfather    Social History   Tobacco Use  . Smoking status: Former Smoker    Packs/day: 0.50    Years: 10.00    Pack years: 5.00    Types: Cigarettes    Start date: 02/02/1973    Last attempt to quit: 09/17/1983    Years since quitting: 35.1  . Smokeless tobacco: Former Systems developer    Types: [place] date: 09/17/1983  Substance Use Topics  . Alcohol  use: No    Alcohol/week: 0.0 standard drinks    Comment: 11-30-15 per pt no  . Drug use: No    Comment: 11-30-15 per pt no    Allergies  Allergen Reactions  . Metoprolol Rash    Current Meds  Medication Sig  . aspirin (ASPIR-LOW) 81 MG EC tablet Take 1 tablet (81 mg total) by mouth daily.  Marland Kitchen atorvastatin (LIPITOR) 40 MG tablet TAKE ONE (1) TABLET BY MOUTH EVERY DAY  . clonazePAM (KLONOPIN) 0.5 MG tablet Take 1 tablet (0.5 mg total) by mouth 3 (three) times daily as needed for anxiety.  Marland Kitchen diltiazem (CARDIZEM) 30 MG tablet Take 1 tablet (30 mg total) by mouth 2 (two) times daily.  Marland Kitchen gabapentin (NEURONTIN) 300 MG capsule TAKE 1 CAPSULE BY MOUTH FOUR TIMES DAILY.  Marland Kitchen glucose blood test strip 1 each by Other route 4 (four) times daily. Use as instructed qid. E11.65. One touch Ultra  . HYDROcodone-acetaminophen (NORCO) 5-325 MG tablet Take 1 tablet by mouth every 6 (six) hours as needed for moderate pain.  Vanessa Kick Ethyl (VASCEPA) 1 g CAPS Take 2 capsules (2 g total) by mouth 2 (two) times daily.  . Insulin Glargine, 1 Unit Dial, (TOUJEO SOLOSTAR) 300 UNIT/ML SOPN Inject 80 Units into the skin at bedtime.  . Insulin Pen Needle (B-D ULTRAFINE III SHORT PEN) 31G X 8 MM MISC 1 each by Does not apply route as directed.  . Lancets MISC 1 each by Does not apply route 4 (four) times daily.  Marland Kitchen losartan (COZAAR) 25 MG tablet TAKE ONE (1) TABLET BY MOUTH EVERY DAY  . metFORMIN (GLUCOPHAGE) 500 MG tablet TAKE 2 TABLETS IN THE MORNING, 1 TABLET AT NOON, AND 2 TABLETS IN THEEVENING.  . nitroGLYCERIN (NITROSTAT) 0.4 MG SL tablet Place 1 tablet (0.4 mg total) under the tongue every 5 (five) minutes as needed for chest pain.  . pantoprazole (PROTONIX) 40 MG tablet Take one daily  . ranolazine (RANEXA) 500 MG 12 hr tablet TAKE (1) TABLET BY MOUTH TWICE DAILY.  Marland Kitchen sertraline (ZOLOFT) 100 MG tablet Take 1 tablet (100 mg total) by mouth daily.    BP 130/88   Pulse 92   Ht 6' 2"  (1.88 m)   Wt 248 lb (112.5  kg)   BMI 31.84 kg/m   Physical Exam Constitutional:      Appearance: He is well-developed.  Skin:    General: Skin is warm and dry.     Capillary Refill: Capillary refill takes less than 2 seconds.  Neurological:     Mental Status: He is alert and oriented to person, place, and time.     Sensory: No sensory deficit.     Coordination: Coordination normal.  Gait: Gait normal.     Deep Tendon Reflexes: Reflexes normal.  Psychiatric:        Mood and Affect: Mood normal.        Behavior: Behavior normal.        Thought Content: Thought content normal.        Judgment: Judgment normal.     Ortho Exam  Bilateral knee exam no tenderness normal range of motion no instability normal strength Bilateral hip exam no tenderness normal range of motion no instability normal strength  Lumbar spine exam tenderness in the left buttock tenderness on the left lateral leg dorsum of the foot  Sensory exam normal bilaterally Reflex exam normal bilaterally Pulse exam normal bilaterally   MEDICAL DECISION SECTION  Xrays were done at The x-ray was negative except for some mild degenerative arthritis  My independent reading of xrays:  Views of the knee were taken on October 27, 2018 he does show some very mild narrowing of the medial compartment alignment is normal  [facility] pelvis and lumbar spine x-ray was taken in the office please see dictated report  Summary of findings are  Encounter Diagnoses  Name Primary?  . Radicular pain of left lower extremity Yes  . Left hip pain   . Degenerative lumbar spinal stenosis     PLAN: (Rx., injectx, surgery, frx, mri/ct) IM DEPOMEDROL 40 MG BY NURSE  MAKE SURE YOU ARE TAKING THE GABAPENTIN 4 X A DAY  MELOXICAM 1 DAILY  PHYSICAL THERAPY X 4 WEEKS  FU 6 WEEKS    Meds ordered this encounter  Medications  . meloxicam (MOBIC) 7.5 MG tablet    Sig: Take 1 tablet (7.5 mg total) by mouth daily.    Dispense:  42 tablet    Refill:  0  .  methocarbamol (ROBAXIN) 500 MG tablet    Sig: Take 1 tablet (500 mg total) by mouth 3 (three) times daily.    Dispense:  60 tablet    Refill:  1    Arther Abbott, MD  11/10/2018 9:39 AM

## 2018-11-10 NOTE — Patient Instructions (Addendum)
Radicular Pain Radicular pain is a type of pain that spreads from your back or neck along a spinal nerve. Spinal nerves are nerves that leave the spinal cord and go to the muscles. Radicular pain is sometimes called radiculopathy, radiculitis, or a pinched nerve. When you have this type of pain, you may also have weakness, numbness, or tingling in the area of your body that is supplied by the nerve. The pain may feel sharp and burning. Depending on which spinal nerve is affected, the pain may occur in the:  Neck area (cervical radicular pain). You may also feel pain, numbness, weakness, or tingling in the arms.  Mid-spine area (thoracic radicular pain). You would feel this pain in the back and chest. This type is rare.  Lower back area (lumbar radicular pain). You would feel this pain as low back pain. You may feel pain, numbness, weakness, or tingling in the buttocks or legs. Sciatica is a type of lumbar radicular pain that shoots down the back of the leg. Radicular pain occurs when one of the spinal nerves becomes irritated or squeezed (compressed). It is often caused by something pushing on a spinal nerve, such as one of the bones of the spine (vertebrae) or one of the round cushions between vertebrae (intervertebral disks). This can result from:  An injury.  Wear and tear or aging of a disk.  The growth of a bone spur that pushes on the nerve. Radicular pain often goes away when you follow instructions from your health care provider for relieving pain at home. Follow these instructions at home: Managing pain      If directed, put ice on the affected area: ? Put ice in a plastic bag. ? Place a towel between your skin and the bag. ? Leave the ice on for 20 minutes, 2-3 times a day.  If directed, apply heat to the affected area as often as told by your health care provider. Use the heat source that your health care provider recommends, such as a moist heat pack or a heating pad. ? Place  a towel between your skin and the heat source. ? Leave the heat on for 20-30 minutes. ? Remove the heat if your skin turns bright red. This is especially important if you are unable to feel pain, heat, or cold. You may have a greater risk of getting burned. Activity   Do not sit or rest in bed for long periods of time.  Try to stay as active as possible. Ask your health care provider what type of exercise or activity is best for you.  Avoid activities that make your pain worse, such as bending and lifting.  Do not lift anything that is heavier than 10 lb (4.5 kg), or the limit that you are told, until your health care provider says that it is safe.  Practice using proper technique when lifting items. Proper lifting technique involves bending your knees and rising up.  Do strength and range-of-motion exercises only as told by your health care provider or physical therapist. General instructions  Take over-the-counter and prescription medicines only as told by your health care provider.  Pay attention to any changes in your symptoms.  Keep all follow-up visits as told by your health care provider. This is important. ? Your health care provider may send you to a physical therapist to help with this pain. Contact a health care provider if:  Your pain and other symptoms get worse.  Your pain medicine is not  helping.  Your pain has not improved after a few weeks of home care.  You have a fever. Get help right away if:  You have severe pain, weakness, or numbness.  You have difficulty with bladder or bowel control. Summary  Radicular pain is a type of pain that spreads from your back or neck along a spinal nerve.  When you have radicular pain, you may also have weakness, numbness, or tingling in the area of your body that is supplied by the nerve.  The pain may feel sharp or burning.  Radicular pain may be treated with ice, heat, medicines, or physical therapy. This  information is not intended to replace advice given to you by your health care provider. Make sure you discuss any questions you have with your health care provider. Document Released: 10/11/2004 Document Revised: 03/18/2018 Document Reviewed: 03/18/2018 Elsevier Interactive Patient Education  2019 [employer] Inc.  Chronic Back Pain When back pain lasts longer than 3 months, it is called chronic back pain.The cause of your back pain may not be known. Some common causes include:  Wear and tear (degenerative disease) of the bones, ligaments, or disks in your back.  Inflammation and stiffness in your back (arthritis). People who have chronic back pain often go through certain periods in which the pain is more intense (flare-ups). Many people can learn to manage the pain with home care. Follow these instructions at home: Pay attention to any changes in your symptoms. Take these actions to help with your pain: Activity   Avoid bending and other activities that make the problem worse.  Maintain a proper position when standing or sitting: ? When standing, keep your upper back and neck straight, with your shoulders pulled back. Avoid slouching. ? When sitting, keep your back straight and relax your shoulders. Do not round your shoulders or pull them backward.  Do not sit or stand in one place for long periods of time.  Take brief periods of rest throughout the day. This will reduce your pain. Resting in a lying or standing position is usually better than sitting to rest.  When you are resting for longer periods, mix in some mild activity or stretching between periods of rest. This will help to prevent stiffness and pain.  Get regular exercise. Ask your health care provider what activities are safe for you.  Do not lift anything that is heavier than 10 lb (4.5 kg). Always use proper lifting technique, which includes: ? Bending your knees. ? Keeping the load close to your body. ? Avoiding  twisting.  Sleep on a firm mattress in a comfortable position. Try lying on your side with your knees slightly bent. If you lie on your back, put a pillow under your knees. Managing pain  If directed, apply ice to the painful area. Your health care provider may recommend applying ice during the first 24-48 hours after a flare-up begins. ? Put ice in a plastic bag. ? Place a towel between your skin and the bag. ? Leave the ice on for 20 minutes, 2-3 times per day.  If directed, apply heat to the affected area as often as told by your health care provider. Use the heat source that your health care provider recommends, such as a moist heat pack or a heating pad. ? Place a towel between your skin and the heat source. ? Leave the heat on for 20-30 minutes. ? Remove the heat if your skin turns bright red. This is especially important if  you are unable to feel pain, heat, or cold. You may have a greater risk of getting burned.  Try soaking in a warm tub.  Take over-the-counter and prescription medicines only as told by your health care provider.  Keep all follow-up visits as told by your health care provider. This is important. Contact a health care provider if:  You have pain that is not relieved with rest or medicine. Get help right away if:  You have weakness or numbness in one or both of your legs or feet.  You have trouble controlling your bladder or your bowels.  You have nausea or vomiting.  You have pain in your abdomen.  You have shortness of breath or you faint. This information is not intended to replace advice given to you by your health care provider. Make sure you discuss any questions you have with your health care provider. Document Released: 10/11/2004 Document Revised: 04/10/2018 Document Reviewed: 03/13/2017 Elsevier Interactive Patient Education  2019 [employer].

## 2018-11-10 NOTE — Addendum Note (Signed)
Addended byCandice Camp on: 11/10/2018 10:01 AM   Modules accepted: Orders

## 2018-11-13 ENCOUNTER — Telehealth: Payer: Self-pay | Admitting: *Deleted

## 2018-11-13 ENCOUNTER — Other Ambulatory Visit: Payer: Self-pay | Admitting: "Endocrinology

## 2018-11-13 MED ORDER — INSULIN GLARGINE (2 UNIT DIAL) 300 UNIT/ML ~~LOC~~ SOPN: 90.0000 [IU] | PEN_INJECTOR | Freq: Every day | SUBCUTANEOUS | 2 refills | Status: DC

## 2018-11-13 NOTE — Telephone Encounter (Signed)
Please advise 

## 2018-11-13 NOTE — Telephone Encounter (Signed)
Patient called stating his sugar numbers are running high from 179-287 night time 253, patient states he is taking his medications and insulin like he is supposed too and he is eating better. Patient doesn't know what else to do. Please advise [phone]

## 2018-11-13 NOTE — Telephone Encounter (Signed)
Jared Anger, MD  Jared Griffin, RMA        It should be Toujeo max instead of [place] for [name].      Spoke with pt and informed him of the message per Dr. Dorris Fetch, patient understood and had no additional questions at this time.

## 2018-11-13 NOTE — Telephone Encounter (Signed)
Would you advise him to increase his Tresiba to 90 units nightly?  I will send a new prescription for Tyler Aas U200 to his pharmacy.

## 2018-11-14 ENCOUNTER — Other Ambulatory Visit: Payer: Self-pay | Admitting: Family Medicine

## 2018-11-14 ENCOUNTER — Other Ambulatory Visit (HOSPITAL_COMMUNITY): Payer: Self-pay | Admitting: Psychiatry

## 2018-11-17 ENCOUNTER — Encounter (HOSPITAL_COMMUNITY): Payer: Self-pay | Admitting: Physical Therapy

## 2018-11-17 ENCOUNTER — Other Ambulatory Visit: Payer: Self-pay

## 2018-11-17 ENCOUNTER — Ambulatory Visit (HOSPITAL_COMMUNITY): Payer: PRIVATE HEALTH INSURANCE | Attending: Orthopedic Surgery | Admitting: Physical Therapy

## 2018-11-17 DIAGNOSIS — M5416 Radiculopathy, lumbar region: Secondary | ICD-10-CM | POA: Diagnosis present

## 2018-11-17 DIAGNOSIS — M6281 Muscle weakness (generalized): Secondary | ICD-10-CM | POA: Diagnosis present

## 2018-11-17 NOTE — Patient Instructions (Addendum)
Functional Quadriceps: Chair Squat    Keeping feet flat on floor, shoulder width apart, squat as low as is comfortable. Use support as necessary. Repeat _10___ times per set. Do __1__ sets per session. Do __2__ sessions per day.  http://orth.exer.us/736   Copyright  VHI. All rights reserved.  Self-Mobilization: Knee Flexion (Prone)   With theraband around your ankles.  Bring left heel toward buttocks as close as possible. Hold __3__ seconds. Relax. Repeat __10__ times per set. Do 1___ sets per session. Do ___2_ sessions per day.  http://orth.exer.us/596   Copyright  VHI. All rights reserved.  Stretching: Hamstring (Supine)    Supporting right thigh behind knee, slowly straighten knee until stretch is felt in back of thigh. Hold __30__ seconds.  Repeat to left  Repeat __3__ times per set. Do 1____ sets per session. Do __2__ sessions per day.  http://orth.exer.us/656   Copyright  VHI. All rights reserved.  Isometric Abdominal    Lying on back with knees bent, tighten stomach by pressing elbows down. Hold _3___ seconds. Repeat 10____ times per set. Do ___1_ sets per session. Do _3___ sessions per day.  http://orth.exer.us/1086   Copyright  VHI. All rights reserved.

## 2018-11-17 NOTE — Therapy (Signed)
[place], [place], [address] Phone: [phone]   Fax:  [phone]  Physical Therapy Evaluation  Patient Details  Name: Jared Griffin MRN: [id number] Date of Birth: 01/28/1961 Referring Provider (PT): Arther Abbott    Encounter Date: 11/17/2018  PT End of Session - 11/17/18 1318    Visit Number  1    Number of Visits  12    Date for PT Re-Evaluation  12/29/18    Authorization - Visit Number  1    Authorization - Number of Visits  15    PT Start Time  0815    PT Stop Time  0900    PT Time Calculation (min)  45 min    Activity Tolerance  Patient tolerated treatment well    Behavior During Therapy  [facility] for tasks assessed/performed       Past Medical History:  Diagnosis Date  . Anxiety   . 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 Coronary atherosclerosis of native coronary artery    a. 12/09/2013: s/p PCI with 3.0 x 28 Promus DES to mLAD  . Cyst (solitary) of breast 10/03/2017   removed from back  . Depression   . Elbow injury 07/25/2017   surgery for torn tendon  . Essential hypertension, benign   . Fatty liver disease, nonalcoholic   . Lateral epicondylitis of right elbow   . Mixed hyperlipidemia 2006  . Obesity   . Osgood-Schlatter's disease of right knee   . Type 2 diabetes mellitus ([place])     Past Surgical History:  Procedure Laterality Date  . CARDIAC CATHETERIZATION  2011  . CARDIAC CATHETERIZATION N/A 09/20/2015   Procedure: Left Heart Cath and Coronary Angiography;  Surgeon: [name], MD;  Location: [place] [facility];  Service: Cardiovascular;  Laterality: N/A;  . CHOLECYSTECTOMY  2003  . COLONOSCOPY  06/19/2012   Procedure: COLONOSCOPY;  Surgeon: [name], MD;  [facility];  Service: Endoscopy;  Laterality: N/A;  930  . COLONOSCOPY N/A 10/17/2017   Procedure: COLONOSCOPY;  Surgeon: [name], MD;  [facility];  Service:  Endoscopy;  Laterality: N/A;  1225  . KNEE ARTHROPLASTY Right 1999  . LEFT HEART CATH AND CORONARY ANGIOGRAPHY N/A 02/26/2018   Procedure: LEFT HEART CATH AND CORONARY ANGIOGRAPHY;  Surgeon: [name], MD;  Location: [place] [facility];  Service: Cardiovascular;  Laterality: N/A;  . LEFT HEART CATHETERIZATION WITH CORONARY ANGIOGRAM N/A 12/09/2013   Procedure: LEFT HEART CATHETERIZATION WITH CORONARY ANGIOGRAM;  Surgeon: [name], MD;  Location: [facility] LAB;  Service: Cardiovascular;  Laterality: N/A;  . Liver biopsy    . POLYPECTOMY  10/17/2017   Procedure: POLYPECTOMY;  Surgeon: [name], MD;  [facility];  Service: Endoscopy;;  ascending colon, splenic flexure,sigmoid x2  . TENNIS ELBOW RELEASE/NIRSCHEL PROCEDURE Right 07/25/2017   Procedure: TENNIS ELBOW RELEASE and debriedment;  Surgeon: [name], MD;  Location: [facility];  Service: Orthopedics;  Laterality: Right;  . TONSILLECTOMY  1970's    There were no vitals filed for this visit.   Subjective Assessment - 11/17/18 0829    Subjective  [name] states that he has back pain on and off for years but his recent episode he has begun to have pain down his left leg therefore he went to the MD who has referred him to therapy.   [name] states that  five weeks ago he was in the bed and stretched his leg pulling his foot back and had a searing pain from the foot to his knee.  The pain centralized into the lateral aspect of his left knee.  He has had hip discomfort for a year.  He went to the MD who had x-ray of his back and knee both of which has DJD.      Pertinent History  Rt elbow tendons torn limited to lifting 10#     How long can you sit comfortably?  no problem     How long can you stand comfortably?  no problem     How long can you walk comfortably?  no problem     Patient Stated Goals  PT states that when he lies down he feels like someone is stabbing him in the leg.  Lying on his  Rt leg eases the pain.      Pain Onset  More than a month ago    Pain Onset  More than a month ago         [facility] PT Assessment - 11/17/18 0001      Assessment   Medical Diagnosis  Lt lumbar radiculopathy    Referring Provider (PT)  Arther Abbott     Prior Therapy  remote       Precautions   Precautions  None      Balance Screen   Has the patient fallen in the past 6 months  No    Has the patient had a decrease in activity level because of a fear of falling?   No    Is the patient reluctant to leave their home because of a fear of falling?   No      Home Environment   Living Environment  Private residence      Prior Function   Level of Independence  Independent      Observation/Other Assessments   Focus on Therapeutic Outcomes (FOTO)   57 (43% limited)      Functional Tests   Functional tests  Single leg stance;Sit to Stand      Single Leg Stance   Comments  RT:  45"    ;   Lt: 28"      Sit to Stand   Comments  5 x 11.08 slight knee pain       ROM / Strength   AROM / PROM / Strength  AROM;Strength      AROM   AROM Assessment Site  Lumbar    Lumbar Flexion  fingers 2" from floor no change in back pain     Lumbar Extension  WFL reps increase pain.    Lumbar - Right Side [facility] reps no change    Lumbar - Left Side Bend  WFL reps no change       Strength   Strength Assessment Site  Hip;Knee;Ankle    Right/Left Hip  Right;Left    Right Hip Flexion  5/5    Right Hip Extension  5/5    Right Hip ABduction  5/5    Left Hip Flexion  4+/5    Left Hip Extension  4-/5    Left Hip ABduction  4/5    Right/Left Knee  Right;Left    Right Knee Flexion  4/5    Right Knee Extension  5/5    Left Knee Flexion  4-/5    Left Knee Extension  4-/5    Right/Left Ankle  Right;Left    Right Ankle Dorsiflexion  5/5    Left Ankle Dorsiflexion  4/5      Flexibility   Soft Tissue Assessment /Muscle Length  yes    Hamstrings  RT:  150:  Lt :  158                 Objective measurements completed on examination: See above findings.      [place] Adult PT Treatment/Exercise - 11/17/18 0001      Exercises   Exercises  Lumbar      Lumbar Exercises: Stretches   Active Hamstring Stretch  Right;Left;2 reps;30 seconds      Lumbar Exercises: Standing   Functional Squats  10 reps      Lumbar Exercises: Supine   Ab Set  10 reps             PT Education - 11/17/18 1317    Education Details  HEP     Person(s) Educated  Patient    Methods  Explanation;Demonstration;Handout    Comprehension  Verbalized understanding;Returned demonstration       PT Short Term Goals - 11/17/18 1327      PT SHORT TERM GOAL #1   Title  PT to state that he is only having radicular sx to mid thigh level to demonstrate decreased nerve compression to improve comfort of pt.     Baseline  radicular sx past knee level     Time  3    Period  Weeks    Status  New    Target Date  12/08/18      PT SHORT TERM GOAL #2   Title  PT pain at the most to be a 5/10 to allow pt to obtain 5 hours of sleep a night for improved health.     Time  3    Period  Weeks    Status  New    Target Date  11/17/18      PT SHORT TERM GOAL #3   Title  Pt Lt LE strength to be increased by 1/2 grade to allow pt to ascend and descend 14 steps without difficulty     Time  3    Period  Weeks    Status  New        PT Long Term Goals - 11/17/18 1330      PT LONG TERM GOAL #1   Title  PT to state that he is I in advance HEP and has not had radicular sx of one week to demonstrate decreased nerve irritation.     Time  6    Period  Weeks    Status  New    Target Date  12/29/18      PT LONG TERM GOAL #2   Title  Pt to state that at the most his pain level in his back, hip and knee is a 2/10 to allow pt to sleep for 6-7 hours a night.     Time  6    Period  Weeks    Status  New      PT LONG TERM GOAL #3   Title  PT Lt LE strength to improve by one grade to allow  the patient to be able to ascend and descend 2 flights of steps without increased pain.     Time  6    Period  Weeks    Status  New      PT LONG TERM GOAL #  4   Title  PT to be able to single leg stance on both LE for up to 60 seconds to demonstrate improve core strength to be able to walk up an incline on uneven ground without difficulty     Time  6    Period  Weeks    Status  New             Plan - 11/17/18 1318    Clinical Impression Statement  Mr. Bollig is a 57 yo male who has had an Lt back pain that radiates to the lateral knee for approximately 5 months.  He has had a recent Rt tendon elbow surgery for which is currently out of work.  He states that the pain is mainly while he is lying down and is interferring with his sleep.  He completes his normal daily activity although he has not been working so he does not know how this would affect him. He is limited to lifting 10 # but this is due to his elbow not his back.  Mr Toso's evaluation demonstrates increased pain, decreased core and Lt LE strength as well as increased pain.  Mr. Sigl will benefit from skilled PT to decrease his pain and improve his functional activity level.      Personal Factors and Comorbidities  Comorbidity 1    Comorbidities  stent, Rt elbow surgery     Examination-Activity Limitations  Stairs;Carry;Sleep    Stability/Clinical Decision Making  Stable/Uncomplicated    Clinical Decision Making  Low    Rehab Potential  Good    PT Frequency  2x / week    PT Duration  6 weeks    PT Treatment/Interventions  ADLs/Self Care Home Management;Therapeutic activities;Therapeutic exercise;Balance training;Patient/family education    PT Next Visit Plan  begin LT hip shift with extension, postural exercises, POE and manual progress to 3D hip extension, balance and high level lumbar stabilization exercises .        Patient will benefit from skilled therapeutic intervention in order to improve the following  deficits and impairments:  Decreased balance, Decreased strength, Impaired flexibility, Pain  Visit Diagnosis: Radiculopathy, lumbar region - Plan: PT plan of care cert/re-cert  Muscle weakness (generalized) - Plan: PT plan of care cert/re-cert     Problem List Patient Active Problem List   Diagnosis Date Noted  . Moderate persistent asthma 10/25/2018  . Dizziness 07/12/2018  . Nonintractable headache   . Abnormal nuclear stress test   . Aftercare following surgery 07/25/17 08/13/2017  . Hx of colonic polyps 08/07/2017  . Lateral epicondylitis of right elbow   . Cervical nerve root impingement 12/09/2015  . Generalized anxiety disorder 09/27/2015  . Chest pain 09/20/2015  . Pain in the chest   . Depression 01/24/2015  . Esophageal reflux 01/24/2015  . Preoperative cardiovascular examination 06/28/2014  . DM type 2 causing vascular disease ([place])   . Mixed hyperlipidemia   . Cirrhosis of liver without mention of alcohol   . Fatty liver disease, nonalcoholic   . Obesity   . Intermediate coronary syndrome ([place]) 12/09/2013  . Fatty liver 05/05/2013  . Lumbago 05/21/2012  . Essential hypertension, benign 12/28/2009  . CAD S/P LAD DES March 2015 12/28/2009  . Hyperlipidemia LDL goal <70 11/25/2009  . Accelerating angina ([place]) 11/25/2009  . DM 11/24/2009  . ABDOMINAL PAIN, HX OF 11/24/2009   Rayetta Humphrey, PT CLT [phone] 11/17/2018, 1:37 PM  [facility] [address]  [place], [place], [address] Phone: [phone]   Fax:  [phone]  Name: Neldon Shepard MRN: [id number] Date of Birth: 1961/08/24

## 2018-11-19 ENCOUNTER — Ambulatory Visit (HOSPITAL_COMMUNITY): Payer: PRIVATE HEALTH INSURANCE | Admitting: Physical Therapy

## 2018-11-19 ENCOUNTER — Encounter (HOSPITAL_COMMUNITY): Payer: Self-pay | Admitting: Physical Therapy

## 2018-11-19 DIAGNOSIS — M5416 Radiculopathy, lumbar region: Secondary | ICD-10-CM

## 2018-11-19 DIAGNOSIS — M6281 Muscle weakness (generalized): Secondary | ICD-10-CM

## 2018-11-19 NOTE — Therapy (Signed)
[facility] [address] [place], [place], [address] Phone: [phone]   Fax:  [phone]  Physical Therapy Treatment  Patient Details  Name: Jared Griffin MRN: [id number] Date of Birth: [birth year] Referring Provider (PT): Arther Abbott    Encounter Date: 11/19/2018  PT End of Session - 11/19/18 0850    Visit Number  2    Number of Visits  12    Date for PT Re-Evaluation  12/29/18    Authorization - Visit Number  2    Authorization - Number of Visits  88    PT Start Time  0815    PT Stop Time  4970    PT Time Calculation (min)  40 min    Activity Tolerance  Patient tolerated treatment well    Behavior During Therapy  [facility] for tasks assessed/performed       Past Medical History:  Diagnosis Date  . Anxiety   . 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 Coronary atherosclerosis of native coronary artery    a. 12/09/2013: s/p PCI with 3.0 x 28 Promus DES to mLAD  . Cyst (solitary) of breast 10/03/2017   removed from back  . Depression   . Elbow injury 07/25/2017   surgery for torn tendon  . Essential hypertension, benign   . Fatty liver disease, nonalcoholic   . Lateral epicondylitis of right elbow   . Mixed hyperlipidemia 2006  . Obesity   . Osgood-Schlatter's disease of right knee   . Type 2 diabetes mellitus ([place])     Past Surgical History:  Procedure Laterality Date  . CARDIAC CATHETERIZATION  2011  . CARDIAC CATHETERIZATION N/A 09/20/2015   Procedure: Left Heart Cath and Coronary Angiography;  Surgeon: [name], MD;  Location: [facility];  Service: Cardiovascular;  Laterality: N/A;  . CHOLECYSTECTOMY  2003  . COLONOSCOPY  06/19/2012   Procedure: COLONOSCOPY;  Surgeon: [name], MD;  [facility];  Service: Endoscopy;  Laterality: N/A;  930  . COLONOSCOPY N/A 10/17/2017   Procedure: COLONOSCOPY;  Surgeon: [name], MD;  [facility];  Service:  Endoscopy;  Laterality: N/A;  1225  . KNEE ARTHROPLASTY Right 1999  . LEFT HEART CATH AND CORONARY ANGIOGRAPHY N/A 02/26/2018   Procedure: LEFT HEART CATH AND CORONARY ANGIOGRAPHY;  Surgeon: [name], MD;  Location: [facility];  Service: Cardiovascular;  Laterality: N/A;  . LEFT HEART CATHETERIZATION WITH CORONARY ANGIOGRAM N/A 12/09/2013   Procedure: LEFT HEART CATHETERIZATION WITH CORONARY ANGIOGRAM;  Surgeon: [name], MD;  Location: [facility] CATH LAB;  Service: Cardiovascular;  Laterality: N/A;  . Liver biopsy    . POLYPECTOMY  10/17/2017   Procedure: POLYPECTOMY;  Surgeon: [name], MD;  [facility];  Service: Endoscopy;;  ascending colon, splenic flexure,sigmoid x2  . TENNIS ELBOW RELEASE/NIRSCHEL PROCEDURE Right 07/25/2017   Procedure: TENNIS ELBOW RELEASE and debriedment;  Surgeon: [name], MD;  Location: [facility];  Service: Orthopedics;  Laterality: Right;  . TONSILLECTOMY  1970's    There were no vitals filed for this visit.  Subjective Assessment - 11/19/18 0816    Subjective  PT states that he has not had any sleep at all due to the exercises flaring up his pain.   He states that the squats and knee flexion exercises seemed to be what flared him up.     Pertinent History  Rt elbow  tendons torn limited to lifting 10#     How long can you sit comfortably?  no problem     How long can you stand comfortably?  no problem     How long can you walk comfortably?  no problem     Patient Stated Goals  PT states that when he lies down he feels like someone is stabbing him in the leg.  Lying on his Rt leg eases the pain.      Currently in Pain?  Yes    Pain Score  7     Pain Location  Back    Pain Orientation  Left    Pain Descriptors / Indicators  Aching;Burning    Pain Type  Chronic pain    Pain Onset  More than a month ago    Pain Frequency  Constant    Aggravating Factors   exercises     Pain Relieving Factors  nothing    Pain  Onset  More than a month ago                       [facility] Adult PT Treatment/Exercise - 11/19/18 0001      Exercises   Exercises  Lumbar      Lumbar Exercises: Stretches   Active Hamstring Stretch  Right;Left;2 reps;30 seconds    Standing Extension  10 reps    Standing Extension Limitations  Lt hip shift     Prone on Elbows Stretch  1 rep;60 seconds    Quad Stretch  Right;Left;3 reps;20 seconds    Other Lumbar Stretch Exercise  3-D hip excursion x 3      Lumbar Exercises: Supine   Other Supine Lumbar Exercises  decompression ex 1-5       Lumbar Exercises: Prone   Other Prone Lumbar Exercises  heel squeeze/glut set x 10       Modalities   Modalities  Moist Heat      Moist Heat Therapy   Number Minutes Moist Heat  15 Minutes    Moist Heat Location  Lumbar Spine   while completing prone exercises      Manual Therapy   Manual Therapy  Soft tissue mobilization    Manual therapy comments  completed seperate from all other aspects of treatment    Soft tissue mobilization  to decrease tightness and improve pain.              PT Education - 11/19/18 0850    Education Details  decompression exercises     Person(s) Educated  Patient    Methods  Explanation    Comprehension  Verbalized understanding;Returned demonstration       PT Short Term Goals - 11/19/18 0827      PT SHORT TERM GOAL #1   Title  PT to state that he is only having radicular sx to mid thigh level to demonstrate decreased nerve compression to improve comfort of pt.     Baseline  radicular sx past knee level     Time  3    Period  Weeks    Status  On-going    Target Date  12/08/18      PT SHORT TERM GOAL #2   Title  PT pain at the most to be a 5/10 to allow pt to obtain 5 hours of sleep a night for improved health.     Time  3    Period  Weeks  Status  On-going    Target Date  11/17/18      PT SHORT TERM GOAL #3   Title  Pt Lt LE strength to be increased by 1/2 grade to allow  pt to ascend and descend 14 steps without difficulty     Time  3    Period  Weeks    Status  On-going        PT Long Term Goals - 11/19/18 0827      PT LONG TERM GOAL #1   Title  PT to state that he is I in advance HEP and has not had radicular sx of one week to demonstrate decreased nerve irritation.     Time  6    Period  Weeks    Status  On-going      PT LONG TERM GOAL #2   Title  Pt to state that at the most his pain level in his back, hip and knee is a 2/10 to allow pt to sleep for 6-7 hours a night.     Time  6    Period  Weeks    Status  On-going      PT LONG TERM GOAL #3   Title  PT Lt LE strength to improve by one grade to allow the patient to be able to ascend and descend 2 flights of steps without increased pain.     Time  6    Period  Weeks    Status  On-going      PT LONG TERM GOAL #4   Title  PT to be able to single leg stance on both LE for up to 60 seconds to demonstrate improve core strength to be able to walk up an incline on uneven ground without difficulty     Time  6    Period  Weeks    Status  On-going            Plan - 11/19/18 0940    Clinical Impression Statement  Reviewed goals and evaluation with patient.  PT had increased pain with exercises therefore therapist changed plan and attempted manual with decompression exercises.      Personal Factors and Comorbidities  Comorbidity 1    Comorbidities  stent, Rt elbow surgery     Examination-Activity Limitations  Stairs;Carry;Sleep    Stability/Clinical Decision Making  Stable/Uncomplicated    Rehab Potential  Good    PT Frequency  2x / week    PT Duration  6 weeks    PT Treatment/Interventions  ADLs/Self Care Home Management;Therapeutic activities;Therapeutic exercise;Balance training;Patient/family education    PT Next Visit Plan  Assess how decompression exercises assisted with decreasing pain.        Patient will benefit from skilled therapeutic intervention in order to improve the  following deficits and impairments:  Decreased balance, Decreased strength, Impaired flexibility, Pain  Visit Diagnosis: Radiculopathy, lumbar region  Muscle weakness (generalized)     Problem List Patient Active Problem List   Diagnosis Date Noted  . Moderate persistent asthma 10/25/2018  . Dizziness 07/12/2018  . Nonintractable headache   . Abnormal nuclear stress test   . Aftercare following surgery 07/25/17 08/13/2017  . Hx of colonic polyps 08/07/2017  . Lateral epicondylitis of right elbow   . Cervical nerve root impingement 12/09/2015  . Generalized anxiety disorder 09/27/2015  . Chest pain 09/20/2015  . Pain in the chest   . Depression 01/24/2015  . Esophageal reflux 01/24/2015  . Preoperative cardiovascular  examination 06/28/2014  . DM type 2 causing vascular disease ([place])   . Mixed hyperlipidemia   . Cirrhosis of liver without mention of alcohol   . Fatty liver disease, nonalcoholic   . Obesity   . Intermediate coronary syndrome ([place]) 12/09/2013  . Fatty liver 05/05/2013  . Lumbago 05/21/2012  . Essential hypertension, benign 12/28/2009  . CAD S/P LAD DES March 2015 12/28/2009  . Hyperlipidemia LDL goal <70 11/25/2009  . Accelerating angina ([place]) 11/25/2009  . DM 11/24/2009  . ABDOMINAL PAIN, HX OF 11/24/2009  Rayetta Humphrey, PT CLT [phone] 11/19/2018, 8:55 AM  [facility] [address] [place], [place], [address] Phone: [phone]   Fax:  [phone]  Name: Jared Griffin MRN: [id number] Date of Birth: Sep 18, 1960

## 2018-11-24 ENCOUNTER — Ambulatory Visit (HOSPITAL_COMMUNITY): Payer: PRIVATE HEALTH INSURANCE | Admitting: Physical Therapy

## 2018-11-24 ENCOUNTER — Encounter (HOSPITAL_COMMUNITY): Payer: Self-pay | Admitting: Physical Therapy

## 2018-11-24 ENCOUNTER — Other Ambulatory Visit: Payer: Self-pay | Admitting: Cardiovascular Disease

## 2018-11-24 DIAGNOSIS — M5416 Radiculopathy, lumbar region: Secondary | ICD-10-CM | POA: Diagnosis not present

## 2018-11-24 DIAGNOSIS — M6281 Muscle weakness (generalized): Secondary | ICD-10-CM

## 2018-11-24 NOTE — Therapy (Signed)
[facility] [address] [place], [place], [address] Phone: [phone]   Fax:  [phone]  Physical Therapy Treatment  Patient Details  Name: Jared Griffin MRN: [id number] Date of Birth: [birth year] Referring Provider (PT): Arther Abbott    Encounter Date: 11/24/2018  PT End of Session - 11/24/18 0841    Visit Number  3    Number of Visits  12    Date for PT Re-Evaluation  12/29/18    Authorization - Visit Number  3    Authorization - Number of Visits  50    PT Start Time  0815    PT Stop Time  0900    PT Time Calculation (min)  45 min    Activity Tolerance  Patient tolerated treatment well    Behavior During Therapy  [facility] for tasks assessed/performed       Past Medical History:  Diagnosis Date  . Anxiety   . 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 Coronary atherosclerosis of native coronary artery    a. 12/09/2013: s/p PCI with 3.0 x 28 Promus DES to mLAD  . Cyst (solitary) of breast 10/03/2017   removed from back  . Depression   . Elbow injury 07/25/2017   surgery for torn tendon  . Essential hypertension, benign   . Fatty liver disease, nonalcoholic   . Lateral epicondylitis of right elbow   . Mixed hyperlipidemia 2006  . Obesity   . Osgood-Schlatter's disease of right knee   . Type 2 diabetes mellitus ([place])     Past Surgical History:  Procedure Laterality Date  . CARDIAC CATHETERIZATION  2011  . CARDIAC CATHETERIZATION N/A 09/20/2015   Procedure: Left Heart Cath and Coronary Angiography;  Surgeon: [name], MD;  Location: [place] [facility];  Service: Cardiovascular;  Laterality: N/A;  . CHOLECYSTECTOMY  2003  . COLONOSCOPY  06/19/2012   Procedure: COLONOSCOPY;  Surgeon: [name], MD;  [facility];  Service: Endoscopy;  Laterality: N/A;  930  . COLONOSCOPY N/A 10/17/2017   Procedure: COLONOSCOPY;  Surgeon: [name], MD;  [facility];  Service:  Endoscopy;  Laterality: N/A;  1225  . KNEE ARTHROPLASTY Right 1999  . LEFT HEART CATH AND CORONARY ANGIOGRAPHY N/A 02/26/2018   Procedure: LEFT HEART CATH AND CORONARY ANGIOGRAPHY;  Surgeon: [name], MD;  Location: [facility];  Service: Cardiovascular;  Laterality: N/A;  . LEFT HEART CATHETERIZATION WITH CORONARY ANGIOGRAM N/A 12/09/2013   Procedure: LEFT HEART CATHETERIZATION WITH CORONARY ANGIOGRAM;  Surgeon: [name], MD;  Location: [facility] C[facility];  Service: Cardiovascular;  Laterality: N/A;  . Liver biopsy    . POLYPECTOMY  10/17/2017   Procedure: POLYPECTOMY;  Surgeon: [name], MD;  [facility];  Service: Endoscopy;;  ascending colon, splenic flexure,sigmoid x2  . TENNIS ELBOW RELEASE/NIRSCHEL PROCEDURE Right 07/25/2017   Procedure: TENNIS ELBOW RELEASE and debriedment;  Surgeon: [name], MD;  Location: [facility];  Service: Orthopedics;  Laterality: Right;  . TONSILLECTOMY  1970's    There were no vitals filed for this visit.  Subjective Assessment - 11/24/18 0814    Subjective  Pt states that he was sore from the exercises that he was given.  He was hurting over the weekend so he did not do any of the exercises     Pertinent History  Rt elbow tendons torn limited to lifting 10#  How long can you sit comfortably?  no problem     How long can you stand comfortably?  no problem     How long can you walk comfortably?  no problem     Patient Stated Goals  PT states that when he lies down he feels like someone is stabbing him in the leg.  Lying on his Rt leg eases the pain.      Currently in Pain?  Yes    Pain Score  4     Pain Location  Back    Pain Orientation  Left    Pain Descriptors / Indicators  Sore;Aching    Pain Type  Chronic pain    Pain Radiating Towards  to right above his LT knee     Pain Onset  More than a month ago    Pain Frequency  Intermittent    Aggravating Factors   lying down    Pain Relieving Factors  not  sure    Pain Onset  More than a month ago                       [facility] Adult PT Treatment/Exercise - 11/24/18 0001      Exercises   Exercises  Lumbar      Lumbar Exercises: Stretches   Active Hamstring Stretch  Right;Left;2 reps;30 seconds    Figure 4 Stretch  1 rep;60 seconds;Seated    Other Lumbar Stretch Exercise  3-D hip excursion x 3    Other Lumbar Stretch Exercise  thoracic excursions.       Lumbar Exercises: Standing   Heel Raises  10 reps    Functional Squats  10 reps      Lumbar Exercises: Supine   Bridge  10 reps    Other Supine Lumbar Exercises  decompression ex 1-5     Other Supine Lumbar Exercises  t-band decompression exercises x10      Lumbar Exercises: Prone   Straight Leg Raise  10 reps    Other Prone Lumbar Exercises  heel squeeze/glut set x 10       Manual Therapy   Manual Therapy  Soft tissue mobilization    Manual therapy comments  completed seperate from all other aspects of treatment    Soft tissue mobilization  to decrease tightness and improve pain.              PT Education - 11/24/18 0845    Education Details  reviewed proper bed mobility.  And the need to incorporate functional squat to lifting techniques.     Person(s) Educated  Patient    Methods  Explanation    Comprehension  Verbalized understanding;Returned demonstration       PT Short Term Goals - 11/19/18 0827      PT SHORT TERM GOAL #1   Title  PT to state that he is only having radicular sx to mid thigh level to demonstrate decreased nerve compression to improve comfort of pt.     Baseline  radicular sx past knee level     Time  3    Period  Weeks    Status  On-going    Target Date  12/08/18      PT SHORT TERM GOAL #2   Title  PT pain at the most to be a 5/10 to allow pt to obtain 5 hours of sleep a night for improved health.     Time  3  Period  Weeks    Status  On-going    Target Date  11/17/18      PT SHORT TERM GOAL #3   Title  Pt Lt LE  strength to be increased by 1/2 grade to allow pt to ascend and descend 14 steps without difficulty     Time  3    Period  Weeks    Status  On-going        PT Long Term Goals - 11/19/18 0827      PT LONG TERM GOAL #1   Title  PT to state that he is I in advance HEP and has not had radicular sx of one week to demonstrate decreased nerve irritation.     Time  6    Period  Weeks    Status  On-going      PT LONG TERM GOAL #2   Title  Pt to state that at the most his pain level in his back, hip and knee is a 2/10 to allow pt to sleep for 6-7 hours a night.     Time  6    Period  Weeks    Status  On-going      PT LONG TERM GOAL #3   Title  PT Lt LE strength to improve by one grade to allow the patient to be able to ascend and descend 2 flights of steps without increased pain.     Time  6    Period  Weeks    Status  On-going      PT LONG TERM GOAL #4   Title  PT to be able to single leg stance on both LE for up to 60 seconds to demonstrate improve core strength to be able to walk up an incline on uneven ground without difficulty     Time  6    Period  Weeks    Status  On-going            Plan - 11/24/18 8099    Clinical Impression Statement  Pt did not complete exercises over the weekend.  Explained to pt that if he does not complete his exercises it is difficulty to see progress.  Added heel raises and t-band decompression exercises.     Personal Factors and Comorbidities  Comorbidity 1    Comorbidities  stent, Rt elbow surgery     Examination-Activity Limitations  Stairs;Carry;Sleep    Stability/Clinical Decision Making  Stable/Uncomplicated    Rehab Potential  Good    PT Frequency  2x / week    PT Duration  6 weeks    PT Treatment/Interventions  ADLs/Self Care Home Management;Therapeutic activities;Therapeutic exercise;Balance training;Patient/family education    PT Next Visit Plan  Continue adding stabilization and educating in body mechanics as well as decompression  exercises.  Add postural t-band exercises.        Patient will benefit from skilled therapeutic intervention in order to improve the following deficits and impairments:  Decreased balance, Decreased strength, Impaired flexibility, Pain  Visit Diagnosis: Radiculopathy, lumbar region  Muscle weakness (generalized)     Problem List Patient Active Problem List   Diagnosis Date Noted  . Moderate persistent asthma 10/25/2018  . Dizziness 07/12/2018  . Nonintractable headache   . Abnormal nuclear stress test   . Aftercare following surgery 07/25/17 08/13/2017  . Hx of colonic polyps 08/07/2017  . Lateral epicondylitis of right elbow   . Cervical nerve root impingement 12/09/2015  . Generalized anxiety disorder  09/27/2015  . Chest pain 09/20/2015  . Pain in the chest   . Depression 01/24/2015  . Esophageal reflux 01/24/2015  . Preoperative cardiovascular examination 06/28/2014  . DM type 2 causing vascular disease ([place])   . Mixed hyperlipidemia   . Cirrhosis of liver without mention of alcohol   . Fatty liver disease, nonalcoholic   . Obesity   . Intermediate coronary syndrome ([place]) 12/09/2013  . Fatty liver 05/05/2013  . Lumbago 05/21/2012  . Essential hypertension, benign 12/28/2009  . CAD S/P LAD DES March 2015 12/28/2009  . Hyperlipidemia LDL goal <70 11/25/2009  . Accelerating angina ([place]) 11/25/2009  . DM 11/24/2009  . ABDOMINAL PAIN, HX OF 11/24/2009    Rayetta Humphrey, PT CLT [phone] 11/24/2018, 8:59 AM  [facility] [address] [place], [place], [address] Phone: [phone]   Fax:  [phone]  Name: Jared Griffin MRN: [id number] Date of Birth: 1961/02/24

## 2018-11-26 ENCOUNTER — Encounter (HOSPITAL_COMMUNITY): Payer: Self-pay | Admitting: Physical Therapy

## 2018-11-26 ENCOUNTER — Ambulatory Visit (HOSPITAL_COMMUNITY): Payer: PRIVATE HEALTH INSURANCE | Admitting: Physical Therapy

## 2018-11-26 ENCOUNTER — Other Ambulatory Visit: Payer: Self-pay

## 2018-11-26 DIAGNOSIS — M5416 Radiculopathy, lumbar region: Secondary | ICD-10-CM

## 2018-11-26 DIAGNOSIS — M6281 Muscle weakness (generalized): Secondary | ICD-10-CM

## 2018-11-26 NOTE — Therapy (Signed)
[facility] [place], [place], [address] Phone: [phone]   Fax:  [phone]  Physical Therapy Treatment  Patient Details  Name: Jared Griffin MRN: [id number] Date of Birth: Nov 12, 1960 Referring Provider (PT): Arther Abbott    Encounter Date: 11/26/2018  PT End of Session - 11/26/18 0856    Visit Number  4    Number of Visits  12    Date for PT Re-Evaluation  12/29/18    Authorization - Visit Number  4    Authorization - Number of Visits  50    PT Start Time  0820    PT Stop Time  0900    PT Time Calculation (min)  40 min    Activity Tolerance  Patient tolerated treatment well    Behavior During Therapy  [facility] for tasks assessed/performed       Past Medical History:  Diagnosis Date  . Anxiety   . 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 Coronary atherosclerosis of native coronary artery    a. 12/09/2013: s/p PCI with 3.0 x 28 Promus DES to mLAD  . Cyst (solitary) of breast 10/03/2017   removed from back  . Depression   . Elbow injury 07/25/2017   surgery for torn tendon  . Essential hypertension, benign   . Fatty liver disease, nonalcoholic   . Lateral epicondylitis of right elbow   . Mixed hyperlipidemia 2006  . Obesity   . Osgood-Schlatter's disease of right knee   . Type 2 diabetes mellitus ([place])     Past Surgical History:  Procedure Laterality Date  . CARDIAC CATHETERIZATION  2011  . CARDIAC CATHETERIZATION N/A 09/20/2015   Procedure: Left Heart Cath and Coronary Angiography;  Surgeon: [name], MD;  Location: [facility];  Service: Cardiovascular;  Laterality: N/A;  . CHOLECYSTECTOMY  2003  . COLONOSCOPY  06/19/2012   Procedure: COLONOSCOPY;  Surgeon: [name], MD;  [facility];  Service: Endoscopy;  Laterality: N/A;  930  . COLONOSCOPY N/A 10/17/2017   Procedure: COLONOSCOPY;  Surgeon: [name], MD;  [facility];  Service:  Endoscopy;  Laterality: N/A;  1225  . KNEE ARTHROPLASTY Right 1999  . LEFT HEART CATH AND CORONARY ANGIOGRAPHY N/A 02/26/2018   Procedure: LEFT HEART CATH AND CORONARY ANGIOGRAPHY;  Surgeon: [name], MD;  Location: [place] [facility];  Service: Cardiovascular;  Laterality: N/A;  . LEFT HEART CATHETERIZATION WITH CORONARY ANGIOGRAM N/A 12/09/2013   Procedure: LEFT HEART CATHETERIZATION WITH CORONARY ANGIOGRAM;  Surgeon: [name], MD;  Location: [facility];  Service: Cardiovascular;  Laterality: N/A;  . Liver biopsy    . POLYPECTOMY  10/17/2017   Procedure: POLYPECTOMY;  Surgeon: [name], MD;  [facility];  Service: Endoscopy;;  ascending colon, splenic flexure,sigmoid x2  . TENNIS ELBOW RELEASE/NIRSCHEL PROCEDURE Right 07/25/2017   Procedure: TENNIS ELBOW RELEASE and debriedment;  Surgeon: [name], MD;  Location: [facility];  Service: Orthopedics;  Laterality: Right;  . TONSILLECTOMY  1970's    There were no vitals filed for this visit.  Subjective Assessment - 11/26/18 0822    Subjective  Pt states that he was sore from the exercises that he was given.  He was hurting over the weekend so he did not do any of the exercises     Pertinent History  Rt elbow tendons torn limited to lifting 10#  How long can you sit comfortably?  no problem     How long can you stand comfortably?  no problem     How long can you walk comfortably?  no problem     Patient Stated Goals  PT states that when he lies down he feels like someone is stabbing him in the leg.  Lying on his Rt leg eases the pain.      Currently in Pain?  Yes    Pain Score  2     Pain Location  Hip    Pain Orientation  Left    Pain Descriptors / Indicators  Aching    Pain Type  Chronic pain    Pain Radiating Towards  hip     Pain Onset  More than a month ago    Pain Frequency  Constant    Aggravating Factors   lying down     Pain Relieving Factors  exercises seem to be helping      Pain Onset  More than a month ago                       [facility] Adult PT Treatment/Exercise - 11/26/18 0001      Exercises   Exercises  Lumbar      Lumbar Exercises: Stretches   Active Hamstring Stretch  Right;Left;2 reps;30 seconds    Prone on Elbows Stretch  1 rep;60 seconds    Quad Stretch  Left;3 reps;30 seconds    Figure 4 Stretch  1 rep;60 seconds;Seated    Gastroc Stretch  3 reps;30 seconds    Gastroc Stretch Limitations  slant board     Other Lumbar Stretch Exercise  3-D hip excursion x 3    Other Lumbar Stretch Exercise  thoracic excursions.       Lumbar Exercises: Standing   Functional Squats  10 reps    Other Standing Lumbar Exercises  wall arch x 10      Lumbar Exercises: Supine   Bridge  15 reps    Straight Leg Raise  10 reps    Straight Leg Raises Limitations  Lt only      Lumbar Exercises: Prone   Straight Leg Raise  10 reps    Other Prone Lumbar Exercises  heel squeeze/glut set x 10       Manual Therapy   Manual Therapy  Joint mobilization;Soft tissue mobilization    Manual therapy comments  completed seperate from all other aspects of treatment    Joint Mobilization  grade II to LT side lower thoracic and lumbar    to improve mobility and decrease pain.    Soft tissue mobilization  to decrease tightness and improve pain.                PT Short Term Goals - 11/19/18 0827      PT SHORT TERM GOAL #1   Title  PT to state that he is only having radicular sx to mid thigh level to demonstrate decreased nerve compression to improve comfort of pt.     Baseline  radicular sx past knee level     Time  3    Period  Weeks    Status  On-going    Target Date  12/08/18      PT SHORT TERM GOAL #2   Title  PT pain at the most to be a 5/10 to allow pt to obtain 5 hours of sleep a night for  improved health.     Time  3    Period  Weeks    Status  On-going    Target Date  11/17/18      PT SHORT TERM GOAL #3   Title  Pt Lt LE strength to be  increased by 1/2 grade to allow pt to ascend and descend 14 steps without difficulty     Time  3    Period  Weeks    Status  On-going        PT Long Term Goals - 11/19/18 0827      PT LONG TERM GOAL #1   Title  PT to state that he is I in advance HEP and has not had radicular sx of one week to demonstrate decreased nerve irritation.     Time  6    Period  Weeks    Status  On-going      PT LONG TERM GOAL #2   Title  Pt to state that at the most his pain level in his back, hip and knee is a 2/10 to allow pt to sleep for 6-7 hours a night.     Time  6    Period  Weeks    Status  On-going      PT LONG TERM GOAL #3   Title  PT Lt LE strength to improve by one grade to allow the patient to be able to ascend and descend 2 flights of steps without increased pain.     Time  6    Period  Weeks    Status  On-going      PT LONG TERM GOAL #4   Title  PT to be able to single leg stance on both LE for up to 60 seconds to demonstrate improve core strength to be able to walk up an incline on uneven ground without difficulty     Time  6    Period  Weeks    Status  On-going            Plan - 11/26/18 0857    Clinical Impression Statement  Pt is improving in sx; ;manual completed to attempt to improve mobility and decrease pain.  Added gastroc stretch, SLR and wall arch with good form noted .     Personal Factors and Comorbidities  Comorbidity 1    Comorbidities  stent, Rt elbow surgery     Examination-Activity Limitations  Stairs;Carry;Sleep    Stability/Clinical Decision Making  Stable/Uncomplicated    Rehab Potential  Good    PT Frequency  2x / week    PT Duration  6 weeks    PT Treatment/Interventions  ADLs/Self Care Home Management;Therapeutic activities;Therapeutic exercise;Balance training;Patient/family education    PT Next Visit Plan  Continue adding stabilization and educating in body mechanics .  Add postural t-band exercises as no time this session        Patient will  benefit from skilled therapeutic intervention in order to improve the following deficits and impairments:  Decreased balance, Decreased strength, Impaired flexibility, Pain  Visit Diagnosis: Radiculopathy, lumbar region  Muscle weakness (generalized)     Problem List Patient Active Problem List   Diagnosis Date Noted  . Moderate persistent asthma 10/25/2018  . Dizziness 07/12/2018  . Nonintractable headache   . Abnormal nuclear stress test   . Aftercare following surgery 07/25/17 08/13/2017  . Hx of colonic polyps 08/07/2017  . Lateral epicondylitis of right elbow   . Cervical nerve root  impingement 12/09/2015  . Generalized anxiety disorder 09/27/2015  . Chest pain 09/20/2015  . Pain in the chest   . Depression 01/24/2015  . Esophageal reflux 01/24/2015  . Preoperative cardiovascular examination 06/28/2014  . DM type 2 causing vascular disease ([place])   . Mixed hyperlipidemia   . Cirrhosis of liver without mention of alcohol   . Fatty liver disease, nonalcoholic   . Obesity   . Intermediate coronary syndrome ([place]) 12/09/2013  . Fatty liver 05/05/2013  . Lumbago 05/21/2012  . Essential hypertension, benign 12/28/2009  . CAD S/P LAD DES March 2015 12/28/2009  . Hyperlipidemia LDL goal <70 11/25/2009  . Accelerating angina ([place]) 11/25/2009  . DM 11/24/2009  . ABDOMINAL PAIN, HX OF 11/24/2009   Rayetta Humphrey, PT CLT [phone] 11/26/2018, 9:02 AM  [place], [place], [address] Phone: [phone]   Fax:  [phone]  Name: Jared Griffin MRN: [id number] Date of Birth: 1961/02/11

## 2018-12-01 ENCOUNTER — Telehealth (HOSPITAL_COMMUNITY): Payer: Self-pay | Admitting: Family Medicine

## 2018-12-01 ENCOUNTER — Ambulatory Visit (HOSPITAL_COMMUNITY): Payer: PRIVATE HEALTH INSURANCE | Admitting: Physical Therapy

## 2018-12-01 NOTE — Telephone Encounter (Signed)
12/01/18  pt left a message to [facility] said that he had a migraine

## 2018-12-02 DIAGNOSIS — Z029 Encounter for administrative examinations, unspecified: Secondary | ICD-10-CM

## 2018-12-03 ENCOUNTER — Encounter (HOSPITAL_COMMUNITY): Payer: Self-pay | Admitting: Physical Therapy

## 2018-12-03 ENCOUNTER — Other Ambulatory Visit: Payer: Self-pay

## 2018-12-03 ENCOUNTER — Ambulatory Visit (HOSPITAL_COMMUNITY): Payer: PRIVATE HEALTH INSURANCE | Admitting: Physical Therapy

## 2018-12-03 DIAGNOSIS — M6281 Muscle weakness (generalized): Secondary | ICD-10-CM

## 2018-12-03 DIAGNOSIS — M5416 Radiculopathy, lumbar region: Secondary | ICD-10-CM | POA: Diagnosis not present

## 2018-12-03 NOTE — Therapy (Signed)
[facility] [address] [place], [place], [address] Phone: [phone]   Fax:  [phone]  Physical Therapy Treatment  Patient Details  Name: Jared Griffin MRN: [id number] Date of Birth: 06-07-1961 Referring Provider (PT): Arther Abbott    Encounter Date: 12/03/2018  PT End of Session - 12/03/18 0838    Visit Number  5    Number of Visits  12    Date for PT Re-Evaluation  12/29/18    Authorization - Visit Number  5    Authorization - Number of Visits  12    PT Start Time  0815    PT Stop Time  4650    PT Time Calculation (min)  43 min    Activity Tolerance  Patient tolerated treatment well    Behavior During Therapy  [facility] for tasks assessed/performed       Past Medical History:  Diagnosis Date  . Anxiety   . 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 Coronary atherosclerosis of native coronary artery    a. 12/09/2013: s/p PCI with 3.0 x 28 Promus DES to mLAD  . Cyst (solitary) of breast 10/03/2017   removed from back  . Depression   . Elbow injury 07/25/2017   surgery for torn tendon  . Essential hypertension, benign   . Fatty liver disease, nonalcoholic   . Lateral epicondylitis of right elbow   . Mixed hyperlipidemia 2006  . Obesity   . Osgood-Schlatter's disease of right knee   . Type 2 diabetes mellitus ([place])     Past Surgical History:  Procedure Laterality Date  . CARDIAC CATHETERIZATION  2011  . CARDIAC CATHETERIZATION N/A 09/20/2015   Procedure: Left Heart Cath and Coronary Angiography;  Surgeon: [name], MD;  Location: [facility];  Service: Cardiovascular;  Laterality: N/A;  . CHOLECYSTECTOMY  2003  . COLONOSCOPY  06/19/2012   Procedure: COLONOSCOPY;  Surgeon: [name], MD;  [facility];  Service: Endoscopy;  Laterality: N/A;  930  . COLONOSCOPY N/A 10/17/2017   Procedure: COLONOSCOPY;  Surgeon: [name], MD;  [facility];  Service:  Endoscopy;  Laterality: N/A;  1225  . KNEE ARTHROPLASTY Right 1999  . LEFT HEART CATH AND CORONARY ANGIOGRAPHY N/A 02/26/2018   Procedure: LEFT HEART CATH AND CORONARY ANGIOGRAPHY;  Surgeon: [name], MD;  Location: [facility];  Service: Cardiovascular;  Laterality: N/A;  . LEFT HEART CATHETERIZATION WITH CORONARY ANGIOGRAM N/A 12/09/2013   Procedure: LEFT HEART CATHETERIZATION WITH CORONARY ANGIOGRAM;  Surgeon: [name], MD;  Location: [facility];  Service: Cardiovascular;  Laterality: N/A;  . Liver biopsy    . POLYPECTOMY  10/17/2017   Procedure: POLYPECTOMY;  Surgeon: [name], MD;  [facility];  Service: Endoscopy;;  ascending colon, splenic flexure,sigmoid x2  . TENNIS ELBOW RELEASE/NIRSCHEL PROCEDURE Right 07/25/2017   Procedure: TENNIS ELBOW RELEASE and debriedment;  Surgeon: [name], MD;  Location: [facility];  Service: Orthopedics;  Laterality: Right;  . TONSILLECTOMY  1970's    There were no vitals filed for this visit.  Subjective Assessment - 12/03/18 0813    Subjective  Pt states that he is feeling much better.  No pain today.     Pertinent History  Rt elbow tendons torn limited to lifting 10#     How long can you sit comfortably?  no problem     How long  can you stand comfortably?  no problem     How long can you walk comfortably?  no problem     Patient Stated Goals  PT states that when he lies down he feels like someone is stabbing him in the leg.  Lying on his Rt leg eases the pain.      Currently in Pain?  No/denies    Pain Onset  More than a month ago    Pain Onset  More than a month ago                       [facility] Adult PT Treatment/Exercise - 12/03/18 0001      Exercises   Exercises  Lumbar      Lumbar Exercises: Stretches   Active Hamstring Stretch  Right;Left;2 reps;30 seconds    Prone on Elbows Stretch  1 rep;60 seconds    Quad Stretch  Left;3 reps;30 seconds    Figure 4 Stretch  1  rep;60 seconds;Seated    Gastroc Stretch  3 reps;30 seconds    Gastroc Stretch Limitations  slant board     Other Lumbar Stretch Exercise  --    Other Lumbar Stretch Exercise  --      Lumbar Exercises: Standing   Functional Squats  --    Scapular Retraction  Strengthening;Both;10 reps    Theraband Level (Scapular Retraction)  Level 3 (Green)    Row  [employer];Theraband    Theraband Level (Row)  Level 3 (Green)    Shoulder Extension  Strengthening;10 reps;Theraband    Theraband Level (Shoulder Extension)  Level 3 (Green)    Other Standing Lumbar Exercises  wall arch x 10    Other Standing Lumbar Exercises  Y lift off  x 10       Lumbar Exercises: Supine   Bridge  --    Single Leg Bridge  5 reps    Straight Leg Raise  10 reps    Straight Leg Raises Limitations  Lt only      Lumbar Exercises: Prone   Straight Leg Raise  --    Opposite Arm/Leg Raise  Right arm/Left leg;Left arm/Right leg;5 reps    Other Prone Lumbar Exercises  heel squeeze/glut set x 10       Manual Therapy   Manual Therapy  Joint mobilization;Soft tissue mobilization    Manual therapy comments  completed seperate from all other aspects of treatment    Joint Mobilization  grade II to LT side lower thoracic and lumbar    to improve mobility and decrease pain.    Soft tissue mobilization  to decrease tightness and improve pain.              PT Education - 12/03/18 0837    Education Details  How to use t-band at home.     Person(s) Educated  Patient    Methods  Explanation    Comprehension  Returned demonstration       PT Short Term Goals - 11/19/18 0827      PT SHORT TERM GOAL #1   Title  PT to state that he is only having radicular sx to mid thigh level to demonstrate decreased nerve compression to improve comfort of pt.     Baseline  radicular sx past knee level     Time  3    Period  Weeks    Status  On-going    Target Date  12/08/18  PT SHORT TERM GOAL #2   Title  PT pain at  the most to be a 5/10 to allow pt to obtain 5 hours of sleep a night for improved health.     Time  3    Period  Weeks    Status  On-going    Target Date  11/17/18      PT SHORT TERM GOAL #3   Title  Pt Lt LE strength to be increased by 1/2 grade to allow pt to ascend and descend 14 steps without difficulty     Time  3    Period  Weeks    Status  On-going        PT Long Term Goals - 11/19/18 0827      PT LONG TERM GOAL #1   Title  PT to state that he is I in advance HEP and has not had radicular sx of one week to demonstrate decreased nerve irritation.     Time  6    Period  Weeks    Status  On-going      PT LONG TERM GOAL #2   Title  Pt to state that at the most his pain level in his back, hip and knee is a 2/10 to allow pt to sleep for 6-7 hours a night.     Time  6    Period  Weeks    Status  On-going      PT LONG TERM GOAL #3   Title  PT Lt LE strength to improve by one grade to allow the patient to be able to ascend and descend 2 flights of steps without increased pain.     Time  6    Period  Weeks    Status  On-going      PT LONG TERM GOAL #4   Title  PT to be able to single leg stance on both LE for up to 60 seconds to demonstrate improve core strength to be able to walk up an incline on uneven ground without difficulty     Time  6    Period  Weeks    Status  On-going            Plan - 12/03/18 7829    Clinical Impression Statement  PT with no pain today.  PT instructed and given t-band for postrual strengthening.  PT advanced to single leg bridge.     Personal Factors and Comorbidities  Comorbidity 1    Comorbidities  stent, Rt elbow surgery     Examination-Activity Limitations  Stairs;Carry;Sleep    Stability/Clinical Decision Making  Stable/Uncomplicated    Rehab Potential  Good    PT Frequency  2x / week    PT Duration  6 weeks    PT Treatment/Interventions  ADLs/Self Care Home Management;Therapeutic activities;Therapeutic exercise;Balance  training;Patient/family education    PT Next Visit Plan  Mini reassess .Continue adding stabilization and educating in body mechanics .         Patient will benefit from skilled therapeutic intervention in order to improve the following deficits and impairments:  Decreased balance, Decreased strength, Impaired flexibility, Pain  Visit Diagnosis: Radiculopathy, lumbar region  Muscle weakness (generalized)     Problem List Patient Active Problem List   Diagnosis Date Noted  . Moderate persistent asthma 10/25/2018  . Dizziness 07/12/2018  . Nonintractable headache   . Abnormal nuclear stress test   . Aftercare following surgery 07/25/17 08/13/2017  . Hx of  colonic polyps 08/07/2017  . Lateral epicondylitis of right elbow   . Cervical nerve root impingement 12/09/2015  . Generalized anxiety disorder 09/27/2015  . Chest pain 09/20/2015  . Pain in the chest   . Depression 01/24/2015  . Esophageal reflux 01/24/2015  . Preoperative cardiovascular examination 06/28/2014  . DM type 2 causing vascular disease ([place])   . Mixed hyperlipidemia   . Cirrhosis of liver without mention of alcohol   . Fatty liver disease, nonalcoholic   . Obesity   . Intermediate coronary syndrome ([place]) 12/09/2013  . Fatty liver 05/05/2013  . Lumbago 05/21/2012  . Essential hypertension, benign 12/28/2009  . CAD S/P LAD DES March 2015 12/28/2009  . Hyperlipidemia LDL goal <70 11/25/2009  . Accelerating angina ([place]) 11/25/2009  . DM 11/24/2009  . ABDOMINAL PAIN, HX OF 11/24/2009    Rayetta Humphrey, PT CLT [phone] 12/03/2018, 9:04 AM  [facility] [address] [place], [place], [address] Phone: [phone]   Fax:  [phone]  Name: Jared Griffin MRN: [id number] Date of Birth: 09-04-1961

## 2018-12-04 ENCOUNTER — Encounter: Payer: Self-pay | Admitting: Orthopedic Surgery

## 2018-12-04 ENCOUNTER — Telehealth: Payer: Self-pay | Admitting: Orthopedic Surgery

## 2018-12-04 NOTE — Telephone Encounter (Signed)
Per Dr Ruthe Mannan review of faxed report received from [employer] medical [employer] disability management services' case manager Oneita Kras, [place], [phone], ext [phone] includes a report from [facility], Dr Otho Bellows, Dr [name] responded with a letter (entered into patient's chart) and faxed to [facility] to Attention of case manager noted. Patient aware.

## 2018-12-05 ENCOUNTER — Telehealth (HOSPITAL_COMMUNITY): Payer: Self-pay

## 2018-12-05 NOTE — Telephone Encounter (Signed)
Called and left message concerning OPPT dept closing for minimal of 2 weeks.Included contact information for questions/concerns to reschedule apts cancelled.  [address] [place]; [employer] [phone]

## 2018-12-08 ENCOUNTER — Ambulatory Visit (HOSPITAL_COMMUNITY): Payer: PRIVATE HEALTH INSURANCE

## 2018-12-10 ENCOUNTER — Ambulatory Visit (HOSPITAL_COMMUNITY): Payer: PRIVATE HEALTH INSURANCE | Admitting: Physical Therapy

## 2018-12-11 ENCOUNTER — Telehealth (HOSPITAL_COMMUNITY): Payer: Self-pay | Admitting: Physical Therapy

## 2018-12-11 NOTE — Telephone Encounter (Signed)
Therapist called to check in on patient. There was no answer so therapist left a message stating that if patient had any questions or concerns to please call us and provided clinic number.  Clarene Critchley PT, DPT 1:55 PM, 12/11/18 [phone]

## 2018-12-16 ENCOUNTER — Other Ambulatory Visit: Payer: Self-pay | Admitting: "Endocrinology

## 2018-12-16 ENCOUNTER — Other Ambulatory Visit: Payer: Self-pay | Admitting: Family Medicine

## 2018-12-22 ENCOUNTER — Encounter: Payer: Self-pay | Admitting: Orthopedic Surgery

## 2018-12-22 ENCOUNTER — Ambulatory Visit (INDEPENDENT_AMBULATORY_CARE_PROVIDER_SITE_OTHER): Payer: PRIVATE HEALTH INSURANCE | Admitting: Orthopedic Surgery

## 2018-12-22 DIAGNOSIS — M5410 Radiculopathy, site unspecified: Secondary | ICD-10-CM | POA: Insufficient documentation

## 2018-12-22 NOTE — Progress Notes (Signed)
Virtual Visit via Telephone Note  I connected with Jared Griffin on 12/22/18 at  2:20 PM EDT by telephone and verified that I am speaking with the correct person using two identifiers.   I discussed the limitations, risks, security and privacy concerns of performing an evaluation and management service by telephone and the availability of in person appointments. I also discussed with the patient that there may be a patient responsible charge related to this service. The patient expressed understanding and agreed to proceed.   History of Present Illness:58 year old male presented to [place] with leg pain we thought to be radicular in nature.  We treated him as noted below and he says that his back got much better after 2 weeks of therapy about a third of the bottle of tablets of Robaxin along with a heating pad  PLAN: (Rx., injectx, surgery, frx, mri/ct) IM DEPOMEDROL 40 MG BY NURSE  MAKE SURE YOU ARE TAKING THE GABAPENTIN 4 X A DAY  MELOXICAM 1 DAILY  PHYSICAL THERAPY X 4 WEEKS  FU 6 WEEKS   System review no weakness or numbness or tingling in his leg   Observations/Objective: Not applicable  Assessment and Plan:  The patient will continue with home exercises use his meloxicam and muscle relaxer and heating pad as needed Follow Up Instructions:  Encounter Diagnosis  Name Primary?  . Radicular pain of left lower extremity Yes      Released from issues related to his leg   I discussed the assessment and treatment plan with the patient. The patient was provided an opportunity to ask questions and all were answered. The patient agreed with the plan and demonstrated an understanding of the instructions.   The patient was advised to call back or seek an in-person evaluation if the symptoms worsen or if the condition fails to improve as anticipated.  I provided 5 minutes of non-face-to-face time during this encounter.   Arther Abbott, MD

## 2018-12-25 ENCOUNTER — Telehealth (HOSPITAL_COMMUNITY): Payer: Self-pay | Admitting: Physical Therapy

## 2018-12-25 NOTE — Telephone Encounter (Signed)
Jared Griffin. Jared Griffin was contacted today regarding temporary reduction of Outpatient Rehabilitation Services at [facility] due to concerns for community transmission of COVID-19.  Patient identity was verified.  Assessed if patient needed to be seen in person by clinician (recent fall or acute injury that requires hands on assessment and advice, change in diet order, post-surgical, special cases, etc.).    Patient did not have an acute/special need that requires in person visit. Proceeded with phone call.  Therapist advised the patient to continue to perform his HEP and assured he had no unanswered questions or concerns at this time.  The patient was offered and declined the continuation of their plan of care by using methods such as an E-Visit, virtual check in, or Telehealth visit.  Outpatient Rehabilitation Services at [facility] will follow up with this client when we are able to safely resume care at the clinic to all populations.  Patient is aware we can be reached by telephone during limited business hours in the meantime.    Jared Griffin PT, DPT 4:54 PM, 12/25/18 [phone]

## 2018-12-26 ENCOUNTER — Ambulatory Visit (INDEPENDENT_AMBULATORY_CARE_PROVIDER_SITE_OTHER): Payer: PRIVATE HEALTH INSURANCE | Admitting: Psychiatry

## 2018-12-26 ENCOUNTER — Encounter (HOSPITAL_COMMUNITY): Payer: Self-pay | Admitting: Psychiatry

## 2018-12-26 ENCOUNTER — Other Ambulatory Visit: Payer: Self-pay

## 2018-12-26 DIAGNOSIS — F419 Anxiety disorder, unspecified: Secondary | ICD-10-CM | POA: Diagnosis not present

## 2018-12-26 DIAGNOSIS — F321 Major depressive disorder, single episode, moderate: Secondary | ICD-10-CM

## 2018-12-26 MED ORDER — CLONAZEPAM 0.5 MG PO TABS: 0.5000 mg | ORAL_TABLET | Freq: Three times a day (TID) | ORAL | 2 refills | Status: DC | PRN

## 2018-12-26 MED ORDER — SERTRALINE HCL 100 MG PO TABS: 100.0000 mg | ORAL_TABLET | Freq: Every day | ORAL | 2 refills | Status: DC

## 2018-12-26 NOTE — Progress Notes (Signed)
Virtual Visit via Video Note  I connected with Jared Griffin on 12/26/18 at  9:20 AM EDT by a video enabled telemedicine application and verified that I am speaking with the correct person using two identifiers.   I discussed the limitations of evaluation and management by telemedicine and the availability of in person appointments. The patient expressed understanding and agreed to proceed    I discussed the assessment and treatment plan with the patient. The patient was provided an opportunity to ask questions and all were answered. The patient agreed with the plan and demonstrated an understanding of the instructions.   The patient was advised to call back or seek an in-person evaluation if the symptoms worsen or if the condition fails to improve as anticipated.  I provided 15 minutes of non-face-to-face time during this encounter.   [name], MD  [facility] MD/PA/NP OP Progress Note  12/26/2018 9:20 AM Jared Griffin  MRN:  [id number]  Chief Complaint:  Chief Complaint    Depression; Anxiety; Follow-up     HPI: This patient is a 58 year old married white male lives with his wife and 34 year old son in [place]. 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 zone  The patient was referred by his primary physician, Dr. [name], for further assessment and treatment of anxiety and depression.  The patient states that he is become increasingly depressed and anxious over the last couple of years due to work stress. He stated that he was always under pressure to get more things done than he had time to do. 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 He got to the point of hating going to work. Several years ago he had cardiac problems and had a stent placed. In January of this year  he began developing dizzy spells chest pain headaches stomach aches. It got so bad that he was readmitted for cardiac catheterization but nothing new was found.  The patient was having difficulty sleeping constant worry and anxiety about his job sadness and anger. He was getting irritable with people at home. After talking to his primary care physician at length and decided to take him out on medical retirement. The patient has been on Wellbutrin SR 150 no gram twice a day for several weeks now. He had also been out in the past when his wife was sick. He has not seen any benefit from this but does feel tremendously better since he stopped working. He is also on Xanax 0.5 mg 3 times a day which he thinks has helped to some degree.  Since stopping working he's had a big turnaround. His sleep and energy have improved. 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 He is much less anxious and is no longer having the chest pain dizzy spells or stomachaches. He denies suicidal ideation or psychotic symptoms. He does not use drugs or alcohol  The patient returns after 3 months and is assessed via telemedicine due to the coronavirus pandemic.  He states that he is generally doing okay.  He is mostly staying around his house and working outside in his yard.  He is avoiding going out in the community unless he has to go to the store for necessities.  His wife has serious medical issues and  cannot be out in public.  He states that for the most part his mood is been stable.  He denies serious depressive thoughts or suicidal ideation.  He is sleeping pretty well his energy is good.  He is still somewhat anxious and particularly does not like to be around a lot of people which serves him well right now during the stay at home order.  Last time we changed him to clonazepam and he does not see much difference compared to Xanax but thinks it works adequately for his anxiety. Visit Diagnosis:    ICD-10-CM    1. Moderate single current episode of major depressive disorder ([place]) F32.1     Past Psychiatric History: none  Past Medical History:  Past Medical History:  Diagnosis Date  . Anxiety   . 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 Coronary atherosclerosis of native coronary artery    a. 12/09/2013: s/p PCI with 3.0 x 28 Promus DES to mLAD  . Cyst (solitary) of breast 10/03/2017   removed from back  . Depression   . Elbow injury 07/25/2017   surgery for torn tendon  . Essential hypertension, benign   . Fatty liver disease, nonalcoholic   . Lateral epicondylitis of right elbow   . Mixed hyperlipidemia 2006  . Obesity   . Osgood-Schlatter's disease of right knee   . Type 2 diabetes mellitus ([place])     Past Surgical History:  Procedure Laterality Date  . CARDIAC CATHETERIZATION  2011  . CARDIAC CATHETERIZATION N/A 09/20/2015   Procedure: Left Heart Cath and Coronary Angiography;  Surgeon: [name], MD;  Location: [place] [facility];  Service: Cardiovascular;  Laterality: N/A;  . CHOLECYSTECTOMY  2003  . COLONOSCOPY  06/19/2012   Procedure: COLONOSCOPY;  Surgeon: [name], MD;  [facility];  Service: Endoscopy;  Laterality: N/A;  930  . COLONOSCOPY N/A 10/17/2017   Procedure: COLONOSCOPY;  Surgeon: [name], MD;  [facility];  Service: Endoscopy;  Laterality: N/A;  1225  . KNEE ARTHROPLASTY Right 1999  . LEFT HEART CATH AND CORONARY ANGIOGRAPHY N/A 02/26/2018   Procedure: LEFT HEART CATH AND CORONARY ANGIOGRAPHY;  Surgeon: [name], MD;  Location: [place] [facility];  Service: Cardiovascular;  Laterality: N/A;  . LEFT HEART CATHETERIZATION WITH CORONARY ANGIOGRAM N/A 12/09/2013   Procedure: LEFT HEART CATHETERIZATION WITH CORONARY ANGIOGRAM;  Surgeon: [name], MD;  Location: [facility];  Service: Cardiovascular;  Laterality: N/A;  . Liver biopsy    . POLYPECTOMY  10/17/2017   Procedure: POLYPECTOMY;   Surgeon: [name], MD;  [facility];  Service: Endoscopy;;  ascending colon, splenic flexure,sigmoid x2  . TENNIS ELBOW RELEASE/NIRSCHEL PROCEDURE Right 07/25/2017   Procedure: TENNIS ELBOW RELEASE and debriedment;  Surgeon: [name], MD;  Location: [facility];  Service: Orthopedics;  Laterality: Right;  . TONSILLECTOMY  1970's    Family Psychiatric History: none  Family History:  Family History  Problem Relation Age of Onset  . Cancer Maternal Aunt   . CVA Maternal Grandmother   . CVA Maternal Grandfather     Social History:  Social History   Socioeconomic History  . Marital status: Married    Spouse name: Not on file  . Number of children: Not on file  . Years of education: Not on file  . Highest education level: Not on file  Occupational History  . Not on file  Social  Needs  . Financial resource strain: Not on file  . Food insecurity:    Worry: Not on file    Inability: Not on file  . Transportation needs:    Medical: Not on file    Non-medical: Not on file  Tobacco Use  . Smoking status: Former Smoker    Packs/day: 0.50    Years: 10.00    Pack years: 5.00    Types: Cigarettes    Start date: 02/02/1973    Last attempt to quit: 09/17/1983    Years since quitting: 35.2  . Smokeless tobacco: Former Systems developer    Types: [place] date: 09/17/1983  Substance and Sexual Activity  . Alcohol use: No    Alcohol/week: 0.0 standard drinks    Comment: 11-30-15 per pt no  . Drug use: No    Comment: 11-30-15 per pt no  . Sexual activity: Yes  Lifestyle  . Physical activity:    Days per week: Not on file    Minutes per session: Not on file  . Stress: Not on file  Relationships  . Social connections:    Talks on phone: Not on file    Gets together: Not on file    Attends religious service: Not on file    Active member of club or organization: Not on file    Attends meetings of clubs or organizations: Not on file    Relationship status: Not on  file  Other Topics Concern  . 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    Married with 1 child   Right handed    12 th    Very little caffeine    Allergies:  Allergies  Allergen Reactions  . Metoprolol Rash    Metabolic Disorder Labs: Lab Results  Component Value Date   HGBA1C 9.0 (H) 10/18/2018   MPG 211.6 07/12/2018   MPG 197.25 07/23/2017   No results found for: PROLACTIN Lab Results  Component Value Date   CHOL 116 10/18/2018   TRIG 173 (H) 10/18/2018   HDL 30 (L) 10/18/2018   CHOLHDL 3.9 10/18/2018   VLDL UNABLE TO CALCULATE IF TRIGLYCERIDE OVER 400 mg/dL 07/12/2018   LDLCALC 51 10/18/2018   LDLCALC UNABLE TO CALCULATE IF TRIGLYCERIDE OVER 400 mg/dL 07/12/2018   Lab Results  Component Value Date   TSH 0.525 07/12/2018   TSH 0.458 04/13/2017    Therapeutic Level Labs: No results found for: LITHIUM No results found for: VALPROATE No components found for:  CBMZ  Current Medications: Current Outpatient Medications  Medication Sig Dispense Refill  . albuterol (PROVENTIL HFA;VENTOLIN HFA) 108 (90 Base) MCG/ACT inhaler Inhale 2 puffs into the lungs every 6 (six) hours as needed for wheezing or shortness of breath. (Patient not taking: Reported on 11/10/2018) 1 Inhaler 2  . aspirin (ASPIR-LOW) 81 MG EC tablet Take 1 tablet (81 mg total) by mouth daily. 30 tablet 12  . atorvastatin (LIPITOR) 40 MG tablet TAKE ONE (1) TABLET BY MOUTH EVERY DAY 90 tablet 3  . clonazePAM (KLONOPIN) 0.5 MG tablet Take 1 tablet (0.5 mg total) by mouth 3 (three) times daily as needed for anxiety. 90 tablet 2  . diltiazem (CARDIZEM) 30 MG tablet TAKE (1) TABLET BY MOUTH TWICE DAILY. 180 tablet 0  . fluticasone (FLOVENT HFA) 44 MCG/ACT inhaler Inhale 2 puffs into the lungs 2 (two) times daily. (Patient not taking: Reported on  11/10/2018) 1 Inhaler 5  . gabapentin (NEURONTIN) 300 MG capsule TAKE 1 CAPSULE BY  MOUTH FOUR TIMES DAILY. 120 capsule 0  . glucose blood test strip 1 each by Other route 4 (four) times daily. Use as instructed qid. E11.65. One touch Ultra 150 each 5  . HYDROcodone-acetaminophen (NORCO) 5-325 MG tablet Take 1 tablet by mouth every 6 (six) hours as needed for moderate pain. 24 tablet 0  . Icosapent Ethyl (VASCEPA) 1 g CAPS Take 2 capsules (2 g total) by mouth 2 (two) times daily. 360 capsule 3  . Insulin Glargine, 2 Unit Dial, (TOUJEO MAX SOLOSTAR) 300 UNIT/ML SOPN Inject 90 Units into the skin at bedtime. 4 pen 2  . Insulin Pen Needle (B-D ULTRAFINE III SHORT PEN) 31G X 8 MM MISC 1 each by Does not apply route as directed. 50 each 3  . Lancets MISC 1 each by Does not apply route 4 (four) times daily. 150 each 5  . losartan (COZAAR) 25 MG tablet TAKE (1) TABLET BY MOUTH ONCE DAILY. 90 tablet 0  . meclizine (ANTIVERT) 25 MG tablet Take 1 tablet (25 mg total) by mouth 3 (three) times daily as needed for dizziness. (Patient not taking: Reported on 11/10/2018) 30 tablet 0  . meloxicam (MOBIC) 7.5 MG tablet Take 1 tablet (7.5 mg total) by mouth daily. 42 tablet 0  . metFORMIN (GLUCOPHAGE) 500 MG tablet TAKE 2 TABLETS IN THE MORNING, 1 TABLET AT NOON, AND 2 TABLETS IN THEEVENING. 450 tablet 0  . methocarbamol (ROBAXIN) 500 MG tablet Take 1 tablet (500 mg total) by mouth 3 (three) times daily. 60 tablet 1  . nitroGLYCERIN (NITROSTAT) 0.4 MG SL tablet Place 1 tablet (0.4 mg total) under the tongue every 5 (five) minutes as needed for chest pain. 25 tablet 3  . pantoprazole (PROTONIX) 40 MG tablet Take one daily 90 tablet 1  . ranolazine (RANEXA) 500 MG 12 hr tablet TAKE (1) TABLET BY MOUTH TWICE DAILY. 60 tablet 3  . sertraline (ZOLOFT) 100 MG tablet Take 1 tablet (100 mg total) by mouth daily. 90 tablet 2   No current facility-administered medications for this visit.      Musculoskeletal: Strength & Muscle Tone: within normal limits Gait & Station: normal Patient leans:  N/A  Psychiatric Specialty Exam: Review of Systems  Musculoskeletal: Positive for back pain and joint pain.  Psychiatric/Behavioral: The patient is nervous/anxious.   All other systems reviewed and are negative.   There were no vitals taken for this visit.There is no height or weight on file to calculate BMI.  General Appearance: Casual and Fairly Groomed  Eye Contact:  Good  Speech:  Clear and Coherent  Volume:  Normal  Mood:  Anxious  Affect:  Appropriate and Congruent  Thought Process:  Goal Directed  Orientation:  Full (Time, Place, and Person)  Thought Content: WDL   Suicidal Thoughts:  No  Homicidal Thoughts:  No  Memory:  Immediate;   Good Recent;   Good Remote;   Good  Judgement:  Good  Insight:  Fair  Psychomotor Activity:  Normal  Concentration:  Concentration: Good and Attention Span: Good  Recall:  Good  Fund of Knowledge: Good  Language: Good  Akathisia:  No  Handed:  Right  AIMS (if indicated): not done  Assets:  Communication Skills Desire for Improvement Resilience Social Support Talents/Skills  ADL's:  Intact  Cognition: WNL  Sleep:  Good   Screenings: GAD-7     Office Visit from 08/05/2018  in [place]  Total GAD-7 Score  5    PHQ2-9     Nutrition from 08/06/2018 in [facility] Office Visit from 08/05/2018 in [place] Office Visit from 07/24/2018 in [facility] Office Visit from 12/02/2017 in [place] Office Visit from 08/02/2017 in [place]  PHQ-2 Total Score  0  1  0  0  0  PHQ-9 Total Score  -  3  -  1  -       Assessment and Plan: This patient is a 58 year old male with a history of significant depression and anxiety.  He seems to be doing much better although he is avoiding situations that cause anxiety.  He will continue Zoloft 100 mg daily for depression and clonazepam 0.5 mg 3 times daily as needed for  anxiety.  He will return to see me in 3 months   [name], MD 12/26/2018, 9:20 AM

## 2018-12-31 ENCOUNTER — Ambulatory Visit (INDEPENDENT_AMBULATORY_CARE_PROVIDER_SITE_OTHER): Payer: PRIVATE HEALTH INSURANCE | Admitting: Family Medicine

## 2018-12-31 ENCOUNTER — Other Ambulatory Visit: Payer: Self-pay

## 2018-12-31 ENCOUNTER — Encounter: Payer: Self-pay | Admitting: Family Medicine

## 2018-12-31 DIAGNOSIS — R071 Chest pain on breathing: Secondary | ICD-10-CM

## 2018-12-31 NOTE — Progress Notes (Signed)
   Subjective:    Patient ID: Jared Griffin, male    DOB: 08/30/1961, 58 y.o.   MRN: [id number] Audio plus visual Abdominal Pain  This is a new problem. Episode onset: one week ago. The problem occurs constantly. The pain is located in the LUQ (left side pain). The quality of the pain is sharp. Exacerbated by: laying on left side. Relieved by: laying on back. He has tried nothing for the symptoms.   Virtual Visit via Video Note  I connected with [name] on 12/31/18 at  3:30 PM EDT by a video enabled telemedicine application and verified that I am speaking with the correct person using two identifiers.   I discussed the limitations of evaluation and management by telemedicine and the availability of in person appointments. The patient expressed understanding and agreed to proceed.  History of Present Illness:    Observations/Objective:   Assessment and Plan:   Follow Up Instructions:    I discussed the assessment and treatment plan with the patient. The patient was provided an opportunity to ask questions and all were answered. The patient agreed with the plan and demonstrated an understanding of the instructions.   The patient was advised to call back or seek an in-person evaluation if the symptoms worsen or if the condition fails to improve as anticipated.  I provided 25 minutes of non-face-to-face time during this encounter.   Patient has known history of heart disease.  Also diabetes.  Also chronic stress.  Describes chest pain.  Sharp in nature with deep breaths.  Also worse with certain motions.  Also worse when laying on his left side.  Does relate it occurred after doing some heavy straining while working.  No nausea no vomiting.  No diaphoresis.  No exertional component.   Review of Systems  Gastrointestinal: Positive for abdominal pain.       Objective:   Physical Exam  Patient notes distinct pain when pressing along the rib margin left side       Assessment & Plan:  Impression patient with considerable risk factors however on full discussion and questioning chest wall pain really seems to be the proper diagnosis.  Discussed at length.  Medications initiated.  Warning signs discussed carefully

## 2019-01-04 ENCOUNTER — Encounter (HOSPITAL_COMMUNITY): Payer: Self-pay | Admitting: Emergency Medicine

## 2019-01-04 ENCOUNTER — Emergency Department (HOSPITAL_COMMUNITY): Payer: PRIVATE HEALTH INSURANCE

## 2019-01-04 ENCOUNTER — Other Ambulatory Visit: Payer: Self-pay

## 2019-01-04 ENCOUNTER — Observation Stay (HOSPITAL_COMMUNITY)
Admission: EM | Admit: 2019-01-04 | Discharge: 2019-01-05 | Disposition: A | Discharge status: Home / Self Care | Payer: PRIVATE HEALTH INSURANCE | Attending: Family Medicine | Admitting: Family Medicine

## 2019-01-04 DIAGNOSIS — I25110 Atherosclerotic heart disease of native coronary artery with unstable angina pectoris: Secondary | ICD-10-CM | POA: Diagnosis not present

## 2019-01-04 DIAGNOSIS — E119 Type 2 diabetes mellitus without complications: Secondary | ICD-10-CM | POA: Insufficient documentation

## 2019-01-04 DIAGNOSIS — I259 Chronic ischemic heart disease, unspecified: Secondary | ICD-10-CM

## 2019-01-04 DIAGNOSIS — E1159 Type 2 diabetes mellitus with other circulatory complications: Secondary | ICD-10-CM | POA: Diagnosis not present

## 2019-01-04 DIAGNOSIS — R079 Chest pain, unspecified: Secondary | ICD-10-CM | POA: Diagnosis present

## 2019-01-04 DIAGNOSIS — J4540 Moderate persistent asthma, uncomplicated: Secondary | ICD-10-CM | POA: Diagnosis not present

## 2019-01-04 DIAGNOSIS — Z791 Long term (current) use of non-steroidal anti-inflammatories (NSAID): Secondary | ICD-10-CM | POA: Diagnosis not present

## 2019-01-04 DIAGNOSIS — I10 Essential (primary) hypertension: Secondary | ICD-10-CM

## 2019-01-04 DIAGNOSIS — F419 Anxiety disorder, unspecified: Secondary | ICD-10-CM | POA: Diagnosis not present

## 2019-01-04 DIAGNOSIS — M9251 Juvenile osteochondrosis of tibia and fibula, right leg: Secondary | ICD-10-CM | POA: Insufficient documentation

## 2019-01-04 DIAGNOSIS — Z7951 Long term (current) use of inhaled steroids: Secondary | ICD-10-CM | POA: Insufficient documentation

## 2019-01-04 DIAGNOSIS — Z87891 Personal history of nicotine dependence: Secondary | ICD-10-CM | POA: Insufficient documentation

## 2019-01-04 DIAGNOSIS — F329 Major depressive disorder, single episode, unspecified: Secondary | ICD-10-CM | POA: Insufficient documentation

## 2019-01-04 DIAGNOSIS — I2510 Atherosclerotic heart disease of native coronary artery without angina pectoris: Secondary | ICD-10-CM

## 2019-01-04 DIAGNOSIS — R072 Precordial pain: Secondary | ICD-10-CM | POA: Diagnosis present

## 2019-01-04 DIAGNOSIS — Z79899 Other long term (current) drug therapy: Secondary | ICD-10-CM | POA: Diagnosis not present

## 2019-01-04 DIAGNOSIS — K760 Fatty (change of) liver, not elsewhere classified: Secondary | ICD-10-CM | POA: Diagnosis not present

## 2019-01-04 DIAGNOSIS — Z9861 Coronary angioplasty status: Secondary | ICD-10-CM

## 2019-01-04 DIAGNOSIS — E782 Mixed hyperlipidemia: Secondary | ICD-10-CM | POA: Diagnosis not present

## 2019-01-04 DIAGNOSIS — I200 Unstable angina: Secondary | ICD-10-CM

## 2019-01-04 DIAGNOSIS — Z955 Presence of coronary angioplasty implant and graft: Secondary | ICD-10-CM | POA: Diagnosis not present

## 2019-01-04 DIAGNOSIS — K219 Gastro-esophageal reflux disease without esophagitis: Secondary | ICD-10-CM | POA: Diagnosis not present

## 2019-01-04 DIAGNOSIS — F411 Generalized anxiety disorder: Secondary | ICD-10-CM | POA: Insufficient documentation

## 2019-01-04 DIAGNOSIS — Z7982 Long term (current) use of aspirin: Secondary | ICD-10-CM | POA: Diagnosis not present

## 2019-01-04 DIAGNOSIS — E785 Hyperlipidemia, unspecified: Secondary | ICD-10-CM

## 2019-01-04 DIAGNOSIS — Z794 Long term (current) use of insulin: Secondary | ICD-10-CM | POA: Diagnosis not present

## 2019-01-04 DIAGNOSIS — E669 Obesity, unspecified: Secondary | ICD-10-CM | POA: Diagnosis not present

## 2019-01-04 DIAGNOSIS — K7460 Unspecified cirrhosis of liver: Secondary | ICD-10-CM | POA: Diagnosis not present

## 2019-01-04 LAB — BASIC METABOLIC PANEL
Anion gap: 13 (ref 5–15)
BUN: 16 mg/dL (ref 6–20)
CO2: 23 mmol/L (ref 22–32)
Calcium: 9.1 mg/dL (ref 8.9–10.3)
Chloride: 102 mmol/L (ref 98–111)
Creatinine, Ser: 1.08 mg/dL (ref 0.61–1.24)
GFR calc Af Amer: >60 mL/min (ref >60)
GFR calc non Af Amer: >60 mL/min (ref >60)
Glucose, Bld: 298 mg/dL — ABNORMAL HIGH (ref 70–99)
Potassium: 3.7 mmol/L (ref 3.5–5.1)
Sodium: 138 mmol/L (ref 135–145)

## 2019-01-04 LAB — CBC
HCT: 43.9 % (ref 39.0–52.0)
Hemoglobin: 15.2 g/dL (ref 13.0–17.0)
MCH: 29.5 pg (ref 26.0–34.0)
MCHC: 34.6 g/dL (ref 30.0–36.0)
MCV: 85.2 fL (ref 80.0–100.0)
Platelets: 152 10*3/uL (ref 150–400)
RBC: 5.15 MIL/uL (ref 4.22–5.81)
RDW: 13.5 % (ref 11.5–15.5)
WBC: 5.7 10*3/uL (ref 4.0–10.5)
nRBC: 0 % (ref 0.0–0.2)

## 2019-01-04 LAB — TROPONIN I
Troponin I: <0.03 ng/mL (ref <0.03)
Troponin I: <0.03 ng/mL (ref <0.03)
Troponin I: <0.03 ng/mL (ref <0.03)
Troponin I: <0.03 ng/mL (ref <0.03)

## 2019-01-04 LAB — GLUCOSE, CAPILLARY
Glucose-Capillary: 167 mg/dL — ABNORMAL HIGH (ref 70–99)
Glucose-Capillary: 220 mg/dL — ABNORMAL HIGH (ref 70–99)

## 2019-01-04 LAB — MRSA PCR SCREENING: MRSA by PCR: POSITIVE — AB

## 2019-01-04 LAB — D-DIMER, QUANTITATIVE: D-Dimer, Quant: <0.27 ug/mL-FEU (ref 0.00–0.50)

## 2019-01-04 LAB — HEPARIN LEVEL (UNFRACTIONATED): Heparin Unfractionated: 0.2 IU/mL — ABNORMAL LOW (ref 0.30–0.70)

## 2019-01-04 MED ORDER — HYDROCODONE-ACETAMINOPHEN 5-325 MG PO TABS
1.0000 | ORAL_TABLET | Freq: Four times a day (QID) | ORAL | Status: DC | PRN
  Administered 2019-01-04 (×2): 1 via ORAL
  Filled 2019-01-04 (×2): qty 1

## 2019-01-04 MED ORDER — ACETAMINOPHEN 325 MG PO TABS
650.0000 mg | ORAL_TABLET | Freq: Four times a day (QID) | ORAL | Status: DC | PRN
  Administered 2019-01-04 (×2): 650 mg via ORAL

## 2019-01-04 MED ORDER — BUDESONIDE 0.5 MG/2ML IN SUSP: 0.5000 mg | Freq: Two times a day (BID) | RESPIRATORY_TRACT | Status: DC

## 2019-01-04 MED ORDER — CLONAZEPAM 0.5 MG PO TABS
0.5000 mg | ORAL_TABLET | Freq: Three times a day (TID) | ORAL | Status: DC | PRN
  Administered 2019-01-04: 0.5 mg via ORAL
  Filled 2019-01-04: qty 1

## 2019-01-04 MED ORDER — ALUM & MAG HYDROXIDE-SIMETH 200-200-20 MG/5ML PO SUSP
30.0000 mL | Freq: Once | ORAL | Status: AC
  Administered 2019-01-04: 30 mL via ORAL
  Filled 2019-01-04: qty 30

## 2019-01-04 MED ORDER — RANOLAZINE ER 500 MG PO TB12
500.0000 mg | ORAL_TABLET | Freq: Two times a day (BID) | ORAL | Status: DC
  Administered 2019-01-04 – 2019-01-05 (×2): 500 mg via ORAL
  Filled 2019-01-04 (×2): qty 1

## 2019-01-04 MED ORDER — MORPHINE SULFATE (PF) 2 MG/ML IV SOLN
2.0000 mg | Freq: Once | INTRAVENOUS | Status: AC
  Administered 2019-01-04: 2 mg via INTRAVENOUS
  Filled 2019-01-04: qty 1

## 2019-01-04 MED ORDER — CHLORHEXIDINE GLUCONATE CLOTH 2 % EX PADS
6.0000 | MEDICATED_PAD | Freq: Every day | CUTANEOUS | Status: DC
  Administered 2019-01-05: 6 via TOPICAL

## 2019-01-04 MED ORDER — ASPIRIN EC 81 MG PO TBEC
81.0000 mg | DELAYED_RELEASE_TABLET | Freq: Every day | ORAL | Status: DC
  Administered 2019-01-05: 81 mg via ORAL
  Filled 2019-01-04: qty 1

## 2019-01-04 MED ORDER — PANTOPRAZOLE SODIUM 40 MG PO TBEC
40.0000 mg | DELAYED_RELEASE_TABLET | Freq: Two times a day (BID) | ORAL | Status: DC
  Administered 2019-01-04 – 2019-01-05 (×2): 40 mg via ORAL
  Filled 2019-01-04 (×2): qty 1

## 2019-01-04 MED ORDER — LIDOCAINE VISCOUS HCL 2 % MT SOLN
15.0000 mL | Freq: Once | OROMUCOSAL | Status: AC
  Administered 2019-01-04: 15 mL via ORAL
  Filled 2019-01-04: qty 15

## 2019-01-04 MED ORDER — MECLIZINE HCL 12.5 MG PO TABS: 25.0000 mg | ORAL_TABLET | Freq: Three times a day (TID) | ORAL | Status: DC | PRN

## 2019-01-04 MED ORDER — MORPHINE SULFATE (PF) 2 MG/ML IV SOLN
2.0000 mg | INTRAVENOUS | Status: DC | PRN
  Administered 2019-01-04: 2 mg via INTRAVENOUS
  Filled 2019-01-04: qty 1

## 2019-01-04 MED ORDER — ATORVASTATIN CALCIUM 40 MG PO TABS
40.0000 mg | ORAL_TABLET | Freq: Every day | ORAL | Status: DC
  Administered 2019-01-04: 40 mg via ORAL
  Filled 2019-01-04: qty 1

## 2019-01-04 MED ORDER — MUPIROCIN 2 % EX OINT
1.0000 "application " | TOPICAL_OINTMENT | Freq: Two times a day (BID) | CUTANEOUS | Status: DC
  Administered 2019-01-05: 1 via NASAL
  Filled 2019-01-04: qty 22

## 2019-01-04 MED ORDER — BUDESONIDE 0.5 MG/2ML IN SUSP
0.5000 mg | Freq: Two times a day (BID) | RESPIRATORY_TRACT | Status: DC
  Administered 2019-01-04 – 2019-01-05 (×2): 0.5 mg via RESPIRATORY_TRACT
  Filled 2019-01-04 (×2): qty 2

## 2019-01-04 MED ORDER — HEPARIN SODIUM (PORCINE) 5000 UNIT/ML IJ SOLN
4000.0000 [IU] | Freq: Once | INTRAMUSCULAR | Status: AC
  Administered 2019-01-04: 4000 [IU] via INTRAVENOUS
  Filled 2019-01-04: qty 1

## 2019-01-04 MED ORDER — FLUTICASONE PROPIONATE HFA 44 MCG/ACT IN AERO: 2.0000 | INHALATION_SPRAY | Freq: Two times a day (BID) | RESPIRATORY_TRACT | Status: DC

## 2019-01-04 MED ORDER — LOSARTAN POTASSIUM 25 MG PO TABS
25.0000 mg | ORAL_TABLET | Freq: Every day | ORAL | Status: DC
  Administered 2019-01-05: 25 mg via ORAL
  Filled 2019-01-04: qty 1

## 2019-01-04 MED ORDER — ASPIRIN 81 MG PO CHEW
324.0000 mg | CHEWABLE_TABLET | Freq: Once | ORAL | Status: DC
  Filled 2019-01-04: qty 4

## 2019-01-04 MED ORDER — INSULIN ASPART 100 UNIT/ML ~~LOC~~ SOLN
0.0000 [IU] | Freq: Every day | SUBCUTANEOUS | Status: DC
  Administered 2019-01-04: 2 [IU] via SUBCUTANEOUS

## 2019-01-04 MED ORDER — METHOCARBAMOL 500 MG PO TABS
500.0000 mg | ORAL_TABLET | Freq: Three times a day (TID) | ORAL | Status: DC
  Administered 2019-01-04 – 2019-01-05 (×3): 500 mg via ORAL
  Filled 2019-01-04 (×3): qty 1

## 2019-01-04 MED ORDER — SERTRALINE HCL 50 MG PO TABS
100.0000 mg | ORAL_TABLET | Freq: Every day | ORAL | Status: DC
  Administered 2019-01-05: 100 mg via ORAL
  Filled 2019-01-04: qty 2

## 2019-01-04 MED ORDER — ACETAMINOPHEN 325 MG PO TABS: 650.0000 mg | ORAL_TABLET | ORAL | Status: DC | PRN

## 2019-01-04 MED ORDER — ACETAMINOPHEN 325 MG PO TABS
ORAL_TABLET | ORAL | Status: AC
  Administered 2019-01-04: 650 mg via ORAL
  Filled 2019-01-04: qty 2

## 2019-01-04 MED ORDER — CHLORHEXIDINE GLUCONATE CLOTH 2 % EX PADS: 6.0000 | MEDICATED_PAD | Freq: Every day | CUTANEOUS | Status: DC

## 2019-01-04 MED ORDER — INSULIN GLARGINE 100 UNIT/ML ~~LOC~~ SOLN
90.0000 [IU] | Freq: Every day | SUBCUTANEOUS | Status: DC
  Administered 2019-01-04: 90 [IU] via SUBCUTANEOUS
  Filled 2019-01-04 (×2): qty 0.9

## 2019-01-04 MED ORDER — INSULIN ASPART 100 UNIT/ML ~~LOC~~ SOLN
0.0000 [IU] | Freq: Three times a day (TID) | SUBCUTANEOUS | Status: DC
  Administered 2019-01-04: 3 [IU] via SUBCUTANEOUS
  Administered 2019-01-05: 2 [IU] via SUBCUTANEOUS

## 2019-01-04 MED ORDER — ONDANSETRON HCL 4 MG/2ML IJ SOLN
4.0000 mg | Freq: Once | INTRAMUSCULAR | Status: AC
  Administered 2019-01-04: 4 mg via INTRAVENOUS
  Filled 2019-01-04: qty 2

## 2019-01-04 MED ORDER — DILTIAZEM HCL 30 MG PO TABS
30.0000 mg | ORAL_TABLET | Freq: Two times a day (BID) | ORAL | Status: DC
  Administered 2019-01-04 – 2019-01-05 (×2): 30 mg via ORAL
  Filled 2019-01-04 (×2): qty 1

## 2019-01-04 MED ORDER — ONDANSETRON HCL 4 MG/2ML IJ SOLN: 4.0000 mg | Freq: Four times a day (QID) | INTRAMUSCULAR | Status: DC | PRN

## 2019-01-04 MED ORDER — BUTALBITAL-APAP-CAFFEINE 50-325-40 MG PO TABS
2.0000 | ORAL_TABLET | Freq: Once | ORAL | Status: AC
  Administered 2019-01-04: 2 via ORAL
  Filled 2019-01-04: qty 2

## 2019-01-04 MED ORDER — NITROGLYCERIN 0.4 MG SL SUBL
0.4000 mg | SUBLINGUAL_TABLET | SUBLINGUAL | Status: DC | PRN
  Administered 2019-01-04 (×3): 0.4 mg via SUBLINGUAL
  Filled 2019-01-04: qty 1

## 2019-01-04 MED ORDER — HEPARIN (PORCINE) 25000 UT/250ML-% IV SOLN: 14.0000 [IU]/kg/h | INTRAVENOUS | Status: DC

## 2019-01-04 MED ORDER — HEPARIN (PORCINE) 25000 UT/250ML-% IV SOLN
1750.0000 [IU]/h | INTRAVENOUS | Status: DC
  Administered 2019-01-04: 1450 [IU]/h via INTRAVENOUS
  Administered 2019-01-05: 1750 [IU]/h via INTRAVENOUS
  Filled 2019-01-04 (×2): qty 250

## 2019-01-04 NOTE — ED Provider Notes (Signed)
[facility] EMERGENCY DEPARTMENT Provider Note   CSN: [id number] Arrival date & time: 01/04/19  1007    History   Chief Complaint Chief Complaint  Patient presents with  . Chest Pain    HPI Jared Griffin is a 58 y.o. male.     HPI   Jared Griffin is a 58 y.o. male with PMH of DM, HTN, hyperlidiemia and CAD (last cath 6/19)  presents to the Emergency Department complaining of intermittent left sided chest pain for one week.  He describes the pain as "dull" and radiating into the left shoulder pain area.  Friday, he noticed the pain became more constant and diffuse in the left chest area.  His symptoms are associated with mild shortness of breath, fatigue and nausea.  No vomiting or diaphoresis.  He states pain feels similar to previous episodes. Took ASA 81 mg this morning and two NTG, one at 10:00 am and another on the way to the ER which did not improve his symptoms.  He denies fever, chills, cough and recent COVID exposure.     PCP Dr. Mickie Hillier, Cardiologist, Dr. Bronson Ing   Past Medical History:  Diagnosis Date  . Anxiety   . 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 Coronary atherosclerosis of native coronary artery    a. 12/09/2013: s/p PCI with 3.0 x 28 Promus DES to mLAD  . Cyst (solitary) of breast 10/03/2017   removed from back  . Depression   . Elbow injury 07/25/2017   surgery for torn tendon  . Essential hypertension, benign   . Fatty liver disease, nonalcoholic   . Lateral epicondylitis of right elbow   . Mixed hyperlipidemia 2006  . Obesity   . Osgood-Schlatter's disease of right knee   . Type 2 diabetes mellitus [facility])     Patient Active Problem List   Diagnosis Date Noted  . Radicular pain of left lower extremity 12/22/2018  . Moderate persistent asthma 10/25/2018  . Dizziness 07/12/2018  . Nonintractable headache   . Abnormal nuclear stress test   . Aftercare following surgery 07/25/17 08/13/2017  . Hx of colonic  polyps 08/07/2017  . Lateral epicondylitis of right elbow   . Cervical nerve root impingement 12/09/2015  . Generalized anxiety disorder 09/27/2015  . Chest pain 09/20/2015  . Pain in the chest   . Depression 01/24/2015  . Esophageal reflux 01/24/2015  . Preoperative cardiovascular examination 06/28/2014  . DM type 2 causing vascular disease ([place])   . Mixed hyperlipidemia   . Cirrhosis of liver without mention of alcohol   . Fatty liver disease, nonalcoholic   . Obesity   . Intermediate coronary syndrome ([place]) 12/09/2013  . Fatty liver 05/05/2013  . Lumbago 05/21/2012  . Essential hypertension, benign 12/28/2009  . CAD S/P LAD DES March 2015 12/28/2009  . Hyperlipidemia LDL goal <70 11/25/2009  . Accelerating angina ([place]) 11/25/2009  . DM 11/24/2009  . ABDOMINAL PAIN, HX OF 11/24/2009    Past Surgical History:  Procedure Laterality Date  . CARDIAC CATHETERIZATION  2011  . CARDIAC CATHETERIZATION N/A 09/20/2015   Procedure: Left Heart Cath and Coronary Angiography;  Surgeon: [name], MD;  Location: [place] CV LAB;  Service: Cardiovascular;  Laterality: N/A;  . CHOLECYSTECTOMY  2003  . COLONOSCOPY  06/19/2012   Procedure: COLONOSCOPY;  Surgeon: [name], MD;  [facility];  Service: Endoscopy;  Laterality: N/A;  930  . COLONOSCOPY N/A 10/17/2017   Procedure:  COLONOSCOPY;  Surgeon: [name], MD;  [facility];  Service: Endoscopy;  Laterality: N/A;  1225  . KNEE ARTHROPLASTY Right 1999  . LEFT HEART CATH AND CORONARY ANGIOGRAPHY N/A 02/26/2018   Procedure: LEFT HEART CATH AND CORONARY ANGIOGRAPHY;  Surgeon: [name], MD;  Location: [place] CV LAB;  Service: Cardiovascular;  Laterality: N/A;  . LEFT HEART CATHETERIZATION WITH CORONARY ANGIOGRAM N/A 12/09/2013   Procedure: LEFT HEART CATHETERIZATION WITH CORONARY ANGIOGRAM;  Surgeon: [name], MD;  [facility];  Service: Cardiovascular;   Laterality: N/A;  . Liver biopsy    . POLYPECTOMY  10/17/2017   Procedure: POLYPECTOMY;  Surgeon: [name], MD;  [facility];  Service: Endoscopy;;  ascending colon, splenic flexure,sigmoid x2  . TENNIS ELBOW RELEASE/NIRSCHEL PROCEDURE Right 07/25/2017   Procedure: TENNIS ELBOW RELEASE and debriedment;  Surgeon: [name], MD;  Location: [facility] ORS;  Service: Orthopedics;  Laterality: Right;  . TONSILLECTOMY  1970's        Home Medications    Prior to Admission medications   Medication Sig Start Date End Date Taking? Authorizing Provider  albuterol (PROVENTIL HFA;VENTOLIN HFA) 108 (90 Base) MCG/ACT inhaler Inhale 2 puffs into the lungs every 6 (six) hours as needed for wheezing or shortness of breath. 04/25/18   Mikey Kirschner, MD  aspirin (ASPIR-LOW) 81 MG EC tablet Take 1 tablet (81 mg total) by mouth daily. 10/24/17   Rehman, Mechele Dawley, MD  atorvastatin (LIPITOR) 40 MG tablet TAKE ONE (1) TABLET BY MOUTH EVERY DAY 05/27/18   Herminio Commons, MD  clonazePAM (KLONOPIN) 0.5 MG tablet Take 1 tablet (0.5 mg total) by mouth 3 (three) times daily as needed for anxiety. 12/26/18 12/26/19  Cloria Spring, MD  diltiazem (CARDIZEM) 30 MG tablet TAKE (1) TABLET BY MOUTH TWICE DAILY. 11/16/18   Mikey Kirschner, MD  fluticasone (FLOVENT HFA) 44 MCG/ACT inhaler Inhale 2 puffs into the lungs 2 (two) times daily. 10/22/18   Mikey Kirschner, MD  gabapentin (NEURONTIN) 300 MG capsule TAKE 1 CAPSULE BY MOUTH FOUR TIMES DAILY. 12/16/18   Mikey Kirschner, MD  glucose blood test strip 1 each by Other route 4 (four) times daily. Use as instructed qid. E11.65. One touch Ultra 09/23/18   Cassandria Anger, MD  HYDROcodone-acetaminophen (NORCO) 5-325 MG tablet Take 1 tablet by mouth every 6 (six) hours as needed for moderate pain. 06/23/18   Mikey Kirschner, MD  Icosapent Ethyl (VASCEPA) 1 g CAPS Take 2 capsules (2 g total) by mouth 2 (two) times daily. 02/17/18   Herminio Commons, MD   Insulin Glargine, 2 Unit Dial, (TOUJEO MAX SOLOSTAR) 300 UNIT/ML SOPN Inject 90 Units into the skin at bedtime. 11/13/18   Cassandria Anger, MD  Insulin Pen Needle (B-D ULTRAFINE III SHORT PEN) 31G X 8 MM MISC 1 each by Does not apply route as directed. 08/07/18   Cassandria Anger, MD  Lancets MISC 1 each by Does not apply route 4 (four) times daily. 09/23/18   Cassandria Anger, MD  losartan (COZAAR) 25 MG tablet TAKE (1) TABLET BY MOUTH ONCE DAILY. 11/24/18   Herminio Commons, MD  meclizine (ANTIVERT) 25 MG tablet Take 1 tablet (25 mg total) by mouth 3 (three) times daily as needed for dizziness. 04/25/18   Mikey Kirschner, MD  meloxicam (MOBIC) 7.5 MG tablet Take 1 tablet (7.5 mg total) by mouth daily. 11/10/18  [name], MD  metFORMIN (GLUCOPHAGE) 500 MG tablet TAKE 2 TABLETS IN THE MORNING, 1 TABLET AT NOON, AND 2 TABLETS IN THEEVENING. 12/16/18   Nida, Marella Chimes, MD  methocarbamol (ROBAXIN) 500 MG tablet Take 1 tablet (500 mg total) by mouth 3 (three) times daily. 11/10/18   [name], MD  nitroGLYCERIN (NITROSTAT) 0.4 MG SL tablet Place 1 tablet (0.4 mg total) under the tongue every 5 (five) minutes as needed for chest pain. 03/26/16   Herminio Commons, MD  pantoprazole (PROTONIX) 40 MG tablet Take one daily 08/05/18   Mikey Kirschner, MD  ranolazine (RANEXA) 500 MG 12 hr tablet TAKE (1) TABLET BY MOUTH TWICE DAILY. 10/03/18   Herminio Commons, MD  sertraline (ZOLOFT) 100 MG tablet Take 1 tablet (100 mg total) by mouth daily. 12/26/18   Cloria Spring, MD    Family History Family History  Problem Relation Age of Onset  . Cancer Maternal Aunt   . CVA Maternal Grandmother   . CVA Maternal Grandfather     Social History Social History   Tobacco Use  . Smoking status: Former Smoker    Packs/day: 0.50    Years: 10.00    Pack years: 5.00    Types: Cigarettes    Start date: 02/02/1973    Last attempt to quit: 09/17/1983    Years since  quitting: 35.3  . Smokeless tobacco: Former Systems developer    Types: [place] date: 09/17/1983  Substance Use Topics  . Alcohol use: No    Alcohol/week: 0.0 standard drinks  . Drug use: No     Allergies   Metoprolol   Review of Systems Review of Systems  Constitutional: Positive for fatigue. Negative for appetite change and fever.  Respiratory: Positive for shortness of breath. Negative for cough.   Cardiovascular: Positive for chest pain. Negative for leg swelling.  Gastrointestinal: Positive for nausea. Negative for abdominal pain and vomiting.  Musculoskeletal: Negative for arthralgias and neck pain.  Skin: Negative for rash.  Neurological: Negative for dizziness, syncope, numbness and headaches.     Physical Exam Updated Vital Signs BP (!) 138/94 (BP Location: Left Arm)   Pulse 91   Temp 98.2 F (36.8 C) (Oral)   Resp 16   Ht 6' 2"  (1.88 m)   Wt 111.1 kg   SpO2 95%   BMI 31.46 kg/m   Physical Exam Vitals signs and nursing note reviewed.  Constitutional:      General: He is not in acute distress.    Appearance: He is well-developed.  Neck:     Musculoskeletal: Normal range of motion and neck supple.  Cardiovascular:     Rate and Rhythm: Normal rate and regular rhythm.     Pulses: Normal pulses.  Pulmonary:     Effort: Pulmonary effort is normal.     Breath sounds: Normal breath sounds.  Abdominal:     General: There is no distension.     Palpations: Abdomen is soft.     Tenderness: There is no abdominal tenderness.  Musculoskeletal:     Right lower leg: No edema.     Left lower leg: No edema.  Skin:    General: Skin is warm.     Capillary Refill: Capillary refill takes less than 2 seconds.     Findings: No rash.  Neurological:     General: No focal deficit present.     Mental Status: He is alert.  Sensory: No sensory deficit.     Motor: No weakness.      ED Treatments / Results  Labs (all labs ordered are listed, but only abnormal results are  displayed) Labs Reviewed  BASIC METABOLIC PANEL - Abnormal; Notable for the following components:      Result Value   Glucose, Bld 298 (*)    All other components within normal limits  CBC  TROPONIN I  TROPONIN I    EKG None   EKG reviewed by Dr. [name].  Unchanged from previous 07/14/18  Radiology Dg Chest 2 View  Result Date: 01/04/2019 CLINICAL DATA:  Ongoing chest pain. EXAM: CHEST - 2 VIEW COMPARISON:  July 12, 2018 FINDINGS: The heart size and mediastinal contours are within normal limits. Both lungs are clear. The visualized skeletal structures are unremarkable. IMPRESSION: No active cardiopulmonary disease. Electronically Signed   By: Dorise Bullion III M.D   On: 01/04/2019 10:51    Procedures Procedures (including critical care time)  Medications Ordered in ED Medications - No data to display   Initial Impression / Assessment and Plan / ED Course  I have reviewed the triage vital signs and the nursing notes.  Pertinent labs & imaging results that were available during my care of the patient were reviewed by me and considered in my medical decision making (see chart for details).         Pt with left sided chest pain diabetes and known coronary disease and multiple cardiac catheterizations with last one 06/19 that showed LV EF of 55-65%  CRITICAL CARE Performed by: Aarika Moon Total critical care time: 30 minutes Critical care time was exclusive of separately billable procedures and treating other patients. Critical care was necessary to treat or prevent imminent or life-threatening deterioration. Critical care was time spent personally by me on the following activities: development of treatment plan with patient and/or surrogate as well as nursing, discussions with consultants, evaluation of patient's response to treatment, examination of patient, obtaining history from patient or surrogate, ordering and performing treatments and interventions, ordering  and review of laboratory studies, ordering and review of radiographic studies, pulse oximetry and re-evaluation of patient's condition.   1110  patient continues to have pain.  Will start heparin, NTG SL.  Given hx of disease and pain not controlled, will consult hospitalist for admit.  Discussed pt care with Dr. [name]   [facility] hospitalist, Dr. Roderic Palau, will admit 1200  Pt had NTG SL x 3 with little relief.  MS ordered  Final Clinical Impressions(s) / ED Diagnoses   Final diagnoses:  Precordial pain  Unstable angina [facility])    ED Discharge Orders    None       Kem Parkinson, PA-C 01/04/19 1225    Fredia Sorrow, MD 01/04/19 1454

## 2019-01-04 NOTE — ED Notes (Signed)
Hospitalist at bedside 

## 2019-01-04 NOTE — ED Provider Notes (Signed)
ED ECG REPORT   Date: 01/04/2019  Rate: 89  Rhythm: normal sinus rhythm  QRS Axis: normal  Intervals: normal  ST/T Wave abnormalities: normal  Conduction Disutrbances:none  Narrative Interpretation:   Old EKG Reviewed: unchanged Today's EKG unchanged from July 11, 2018.  Certainly no significant changes.  I have personally reviewed the EKG tracing and agree with the computerized printout as noted.    Fredia Sorrow, MD 01/04/19 [phone]

## 2019-01-04 NOTE — ED Notes (Signed)
Pt has had 81 mg of aspirin today.

## 2019-01-04 NOTE — ED Notes (Signed)
ED TO INPATIENT HANDOFF REPORT  ED Nurse Name and Phone #: [phone]  S Name/Age/Gender Jared Griffin Alert 58 y.o. male Room/Bed: APA16A/APA16A  Code Status   Code Status: Prior  Home/SNF/Other Home Patient oriented to: x4 Is this baseline? YES  Triage Complete: Triage complete  Chief Complaint chest pain x 2 days  Triage Note Pt reports ongoing chest pain for about a week intermittently. Yesterday became more constant and began to radiate toward left neck.  Took 2 nitro this morning with some relief from pain.     Allergies Allergies  Allergen Reactions  . Metoprolol Rash    Level of Care/Admitting Diagnosis ED Disposition    ED Disposition Condition [facility] Area: [facility] [[id number]]  Level of Care: Stepdown [14]  Covid Evaluation: N/A  Diagnosis: Chest pain [338250]  Admitting Physician: Kathie Dike [[id number]]  Attending Physician: Kathie Dike [[id number]]  PT Class (Do Not Modify): Observation [104]  PT Acc Code (Do Not Modify): Observation [10022]       B Medical/Surgery History Past Medical History:  Diagnosis Date  . Anxiety   . 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 Coronary atherosclerosis of native coronary artery    a. 12/09/2013: s/p PCI with 3.0 x 28 Promus DES to mLAD  . Cyst (solitary) of breast 10/03/2017   removed from back  . Depression   . Elbow injury 07/25/2017   surgery for torn tendon  . Essential hypertension, benign   . Fatty liver disease, nonalcoholic   . Lateral epicondylitis of right elbow   . Mixed hyperlipidemia 2006  . Obesity   . Osgood-Schlatter's disease of right knee   . Type 2 diabetes mellitus ([place])    Past Surgical History:  Procedure Laterality Date  . CARDIAC CATHETERIZATION  2011  . CARDIAC CATHETERIZATION N/A 09/20/2015   Procedure: Left Heart Cath and Coronary Angiography;  Surgeon: [name], MD;  Location: [place] CV LAB;  Service: Cardiovascular;   Laterality: N/A;  . CHOLECYSTECTOMY  2003  . COLONOSCOPY  06/19/2012   Procedure: COLONOSCOPY;  Surgeon: [name], MD;  [facility];  Service: Endoscopy;  Laterality: N/A;  930  . COLONOSCOPY N/A 10/17/2017   Procedure: COLONOSCOPY;  Surgeon: [name], MD;  [facility];  Service: Endoscopy;  Laterality: N/A;  1225  . KNEE ARTHROPLASTY Right 1999  . LEFT HEART CATH AND CORONARY ANGIOGRAPHY N/A 02/26/2018   Procedure: LEFT HEART CATH AND CORONARY ANGIOGRAPHY;  Surgeon: [name], MD;  Location: [place] CV LAB;  Service: Cardiovascular;  Laterality: N/A;  . LEFT HEART CATHETERIZATION WITH CORONARY ANGIOGRAM N/A 12/09/2013   Procedure: LEFT HEART CATHETERIZATION WITH CORONARY ANGIOGRAM;  Surgeon: [name], MD;  Location: [facility] CATH LAB;  Service: Cardiovascular;  Laterality: N/A;  . Liver biopsy    . POLYPECTOMY  10/17/2017   Procedure: POLYPECTOMY;  Surgeon: [name], MD;  [facility];  Service: Endoscopy;;  ascending colon, splenic flexure,sigmoid x2  . TENNIS ELBOW RELEASE/NIRSCHEL PROCEDURE Right 07/25/2017   Procedure: TENNIS ELBOW RELEASE and debriedment;  Surgeon: [name], MD;  Location: [facility] ORS;  Service: Orthopedics;  Laterality: Right;  . TONSILLECTOMY  1970's     A IV Location/Drains/Wounds Patient Lines/Drains/Airways Status   Active Line/Drains/Airways    Name:   Placement date:   Placement time:   Site:   Days:   Peripheral IV 01/04/19 Right Antecubital  01/04/19    1024    Antecubital   less than 1   Peripheral IV 01/04/19 Left Antecubital   01/04/19    1143    Antecubital   less than 1          Intake/Output Last 24 hours No intake or output data in the 24 hours ending 01/04/19 1351  Labs/Imaging Results for orders placed or performed during the hospital encounter of 01/04/19 (from the past 48 hour(s))  Basic metabolic panel     Status: Abnormal   Collection Time: 01/04/19 10:16 AM   Result Value Ref Range   Sodium 138 135 - 145 mmol/L   Potassium 3.7 3.5 - 5.1 mmol/L   Chloride 102 98 - 111 mmol/L   CO2 23 22 - 32 mmol/L   Glucose, Bld 298 (H) 70 - 99 mg/dL   BUN 16 6 - 20 mg/dL   Creatinine, Ser 1.08 0.61 - 1.24 mg/dL   Calcium 9.1 8.9 - 10.3 mg/dL   GFR calc non Af [name] >60 >60 mL/min   GFR calc Af [name] >60 >60 mL/min   Anion gap 13 5 - 15    Comment: Performed at [facility], [address] [place], [place] [address]  CBC     Status: None   Collection Time: 01/04/19 10:16 AM  Result Value Ref Range   WBC 5.7 4.0 - 10.5 K/uL   RBC 5.15 4.22 - 5.81 MIL/uL   Hemoglobin 15.2 13.0 - 17.0 g/dL   HCT 43.9 39.0 - 52.0 %   MCV 85.2 80.0 - 100.0 fL   MCH 29.5 26.0 - 34.0 pg   MCHC 34.6 30.0 - 36.0 g/dL   RDW 13.5 11.5 - 15.5 %   Platelets 152 150 - 400 K/uL   nRBC 0.0 0.0 - 0.2 %    Comment: Performed at [facility], [address] [place], [place] [address]  Troponin I - ONCE - STAT     Status: None   Collection Time: 01/04/19 10:16 AM  Result Value Ref Range   Troponin I <0.03 <0.03 ng/mL    Comment: Performed at [facility], [address] [place], [place] [address]  Troponin I - Once     Status: None   Collection Time: 01/04/19 11:37 AM  Result Value Ref Range   Troponin I <0.03 <0.03 ng/mL    Comment: Performed at [facility], [address], [place], [place] [address]   Dg Chest 2 View  Result Date: 01/04/2019 CLINICAL DATA:  Ongoing chest pain. EXAM: CHEST - 2 VIEW COMPARISON:  July 12, 2018 FINDINGS: The heart size and mediastinal contours are within normal limits. Both lungs are clear. The visualized skeletal structures are unremarkable. IMPRESSION: No active cardiopulmonary disease. Electronically Signed   By: Dorise Bullion III M.D   On: 01/04/2019 10:51    Pending Labs Unresulted Labs (From admission, onward)    Start     Ordered   01/05/19 0500  Heparin level (unfractionated)  Daily,   R     01/04/19 1236   01/05/19 0500  CBC   Daily,   R     01/04/19 1236   01/04/19 1730  Heparin level (unfractionated)  Once-Timed,   R     01/04/19 1236   Signed and Held  Troponin I - Now Then Q6H  Now then every 6 hours,   R     Signed and Held   Signed and Held  D-dimer, quantitative (not at  [place])  Once,   R     Signed and Held          Vitals/Pain Today's Vitals   01/04/19 1230 01/04/19 1247 01/04/19 1300 01/04/19 1330  BP: 124/81  125/79 129/85  Pulse: 85  92 84  Resp: 12  (!) 21 19  Temp:      TempSrc:      SpO2: 95%  92% 94%  Weight:      Height:      PainSc:  6       Isolation Precautions No active isolations  Medications Medications  nitroGLYCERIN (NITROSTAT) SL tablet 0.4 mg (0.4 mg Sublingual Given 01/04/19 1152)  heparin ADULT infusion 100 units/mL (25000 units/243m sodium chloride 0.45%) (1,450 Units/hr Intravenous New Bag/Given 01/04/19 1142)  acetaminophen (TYLENOL) tablet 650 mg (650 mg Oral Given 01/04/19 1305)  heparin injection 4,000 Units (4,000 Units Intravenous Given 01/04/19 1125)  ondansetron (ZOFRAN) injection 4 mg (4 mg Intravenous Given 01/04/19 1133)  morphine 2 MG/ML injection 2 mg (2 mg Intravenous Given 01/04/19 1213)    Mobility walks Low fall risk   Focused Assessments    R Recommendations: See Admitting Provider Note  Report given to:   Additional Notes: Aneesh Faller

## 2019-01-04 NOTE — H&P (Signed)
History and Physical    Jared Griffin QBV:[id number] DOB: Sep 24, 1960 DOA: 01/04/2019  PCP: Mikey Kirschner, MD  Patient coming from: home  I have personally briefly reviewed patient's old medical records in [place]  Chief Complaint: chest pain  HPI: Jared Griffin is a 58 y.o. male with medical history significant of coronary artery disease with prior stents, diabetes, hypertension, anxiety, presents to the hospital with complaints of chest comfort.  Reports onset of symptoms occurring for the past 2 days.  Describes shooting pain in his left lower chest which radiates up his sternum and around his back.  This is associated with shortness of breath, nausea.  At night he does have diaphoresis.  Symptoms do occur at rest.  He feels the symptoms were similar to those that he had when he had stents placed.  He is not had any fever.  He has a chronic cough that is unchanged.  He has not had any vomiting or diarrhea.  ED Course: EKG done in the emergency room did not show any acute changes.  Troponin was negative.  Basic labs were unrevealing.  Chest x-ray did not show any acute findings.  Due to his significant history, he has been referred for observation.  Review of Systems: As per HPI otherwise 10 point review of systems negative.    Past Medical History:  Diagnosis Date  . Anxiety   . 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 Coronary atherosclerosis of native coronary artery    a. 12/09/2013: s/p PCI with 3.0 x 28 Promus DES to mLAD  . Cyst (solitary) of breast 10/03/2017   removed from back  . Depression   . Elbow injury 07/25/2017   surgery for torn tendon  . Essential hypertension, benign   . Fatty liver disease, nonalcoholic   . Lateral epicondylitis of right elbow   . Mixed hyperlipidemia 2006  . Obesity   . Osgood-Schlatter's disease of right knee   . Type 2 diabetes mellitus ([place])     Past Surgical History:  Procedure Laterality Date   . CARDIAC CATHETERIZATION  2011  . CARDIAC CATHETERIZATION N/A 09/20/2015   Procedure: Left Heart Cath and Coronary Angiography;  Surgeon: [name], MD;  Location: [facility];  Service: Cardiovascular;  Laterality: N/A;  . CHOLECYSTECTOMY  2003  . COLONOSCOPY  06/19/2012   Procedure: COLONOSCOPY;  Surgeon: [name], MD;  Location: [facility];  Service: Endoscopy;  Laterality: N/A;  930  . COLONOSCOPY N/A 10/17/2017   Procedure: COLONOSCOPY;  Surgeon: [name], MD;  Location: [facility];  Service: Endoscopy;  Laterality: N/A;  1225  . KNEE ARTHROPLASTY Right 1999  . LEFT HEART CATH AND CORONARY ANGIOGRAPHY N/A 02/26/2018   Procedure: LEFT HEART CATH AND CORONARY ANGIOGRAPHY;  Surgeon: [name], MD;  Location: [facility];  Service: Cardiovascular;  Laterality: N/A;  . LEFT HEART CATHETERIZATION WITH CORONARY ANGIOGRAM N/A 12/09/2013   Procedure: LEFT HEART CATHETERIZATION WITH CORONARY ANGIOGRAM;  Surgeon: [name], MD;  Location: [facility];  Service: Cardiovascular;  Laterality: N/A;  . Liver biopsy    . POLYPECTOMY  10/17/2017   Procedure: POLYPECTOMY;  Surgeon: [name], MD;  Location: [facility];  Service: Endoscopy;;  ascending colon, splenic flexure,sigmoid x2  . TENNIS ELBOW RELEASE/NIRSCHEL PROCEDURE Right 07/25/2017   Procedure: TENNIS ELBOW RELEASE and debriedment;  Surgeon: [name], MD;  Location: [facility];  Service: Orthopedics;  Laterality: Right;  . TONSILLECTOMY  1970's    Social History:  reports that he quit smoking about 35 years ago. His smoking use included cigarettes. He started smoking about 45 years ago. He has a 5.00 pack-year smoking history. He quit smokeless tobacco use about 35 years ago.  His smokeless tobacco use included chew. He reports that he does not drink alcohol or use drugs.  Allergies  Allergen Reactions  . Metoprolol Rash    Family History  Problem Relation Age  of Onset  . Cancer Maternal Aunt   . CVA Maternal Grandmother   . CVA Maternal Grandfather      Prior to Admission medications   Medication Sig Start Date End Date Taking? Authorizing Provider  albuterol (PROVENTIL HFA;VENTOLIN HFA) 108 (90 Base) MCG/ACT inhaler Inhale 2 puffs into the lungs every 6 (six) hours as needed for wheezing or shortness of breath. 04/25/18   Mikey Kirschner, MD  aspirin (ASPIR-LOW) 81 MG EC tablet Take 1 tablet (81 mg total) by mouth daily. 10/24/17   Rehman, Mechele Dawley, MD  atorvastatin (LIPITOR) 40 MG tablet TAKE ONE (1) TABLET BY MOUTH EVERY DAY 05/27/18   Herminio Commons, MD  clonazePAM (KLONOPIN) 0.5 MG tablet Take 1 tablet (0.5 mg total) by mouth 3 (three) times daily as needed for anxiety. 12/26/18 12/26/19  Cloria Spring, MD  diltiazem (CARDIZEM) 30 MG tablet TAKE (1) TABLET BY MOUTH TWICE DAILY. 11/16/18   Mikey Kirschner, MD  fluticasone (FLOVENT HFA) 44 MCG/ACT inhaler Inhale 2 puffs into the lungs 2 (two) times daily. 10/22/18   Mikey Kirschner, MD  gabapentin (NEURONTIN) 300 MG capsule TAKE 1 CAPSULE BY MOUTH FOUR TIMES DAILY. 12/16/18   Mikey Kirschner, MD  glucose blood test strip 1 each by Other route 4 (four) times daily. Use as instructed qid. E11.65. One touch Ultra 09/23/18   Cassandria Anger, MD  HYDROcodone-acetaminophen (NORCO) 5-325 MG tablet Take 1 tablet by mouth every 6 (six) hours as needed for moderate pain. 06/23/18   Mikey Kirschner, MD  Icosapent Ethyl (VASCEPA) 1 g CAPS Take 2 capsules (2 g total) by mouth 2 (two) times daily. 02/17/18   Herminio Commons, MD  Insulin Glargine, 2 Unit Dial, (TOUJEO MAX SOLOSTAR) 300 UNIT/ML SOPN Inject 90 Units into the skin at bedtime. 11/13/18   Cassandria Anger, MD  Insulin Pen Needle (B-D ULTRAFINE III SHORT PEN) 31G X 8 MM MISC 1 each by Does not apply route as directed. 08/07/18   Cassandria Anger, MD  Lancets MISC 1 each by Does not apply route 4 (four) times daily. 09/23/18   Cassandria Anger, MD  losartan (COZAAR) 25 MG tablet TAKE (1) TABLET BY MOUTH ONCE DAILY. 11/24/18   Herminio Commons, MD  meclizine (ANTIVERT) 25 MG tablet Take 1 tablet (25 mg total) by mouth 3 (three) times daily as needed for dizziness. 04/25/18   Mikey Kirschner, MD  meloxicam (MOBIC) 7.5 MG tablet Take 1 tablet (7.5 mg total) by mouth daily. 11/10/18   [name], MD  metFORMIN (GLUCOPHAGE) 500 MG tablet TAKE 2 TABLETS IN THE MORNING, 1 TABLET AT NOON, AND 2 TABLETS IN THEEVENING. 12/16/18   Nida, Marella Chimes, MD  methocarbamol (ROBAXIN) 500 MG tablet Take 1 tablet (500 mg total) by mouth 3 (three) times daily. 11/10/18   [name], MD  nitroGLYCERIN (NITROSTAT) 0.4 MG SL tablet Place 1 tablet (0.4 mg total)  under the tongue every 5 (five) minutes as needed for chest pain. 03/26/16   Herminio Commons, MD  pantoprazole (PROTONIX) 40 MG tablet Take one daily 08/05/18   Mikey Kirschner, MD  ranolazine (RANEXA) 500 MG 12 hr tablet TAKE (1) TABLET BY MOUTH TWICE DAILY. 10/03/18   Herminio Commons, MD  sertraline (ZOLOFT) 100 MG tablet Take 1 tablet (100 mg total) by mouth daily. 12/26/18   Cloria Spring, MD    Physical Exam: Vitals:   01/04/19 1130 01/04/19 1200 01/04/19 1230 01/04/19 1300  BP: 129/84 127/85 124/81 125/79  Pulse: 85 97 85 92  Resp: 20 18 12  (!) 21  Temp:      TempSrc:      SpO2: 93% 91% 95% 92%  Weight:      Height:        Constitutional: NAD, calm, comfortable Eyes: PERRL, lids and conjunctivae normal ENMT: Mucous membranes are moist. Posterior pharynx clear of any exudate or lesions.Normal dentition.  Neck: normal, supple, no masses, no thyromegaly Respiratory: clear to auscultation bilaterally, no wheezing, no crackles. Normal respiratory effort. No accessory muscle use.  Cardiovascular: Regular rate and rhythm, no murmurs / rubs / gallops. No extremity edema. 2+ pedal pulses. No carotid bruits.  Abdomen: no tenderness, no masses  palpated. No hepatosplenomegaly. Bowel sounds positive.  Musculoskeletal: no clubbing / cyanosis. No joint deformity upper and lower extremities. Good ROM, no contractures. Normal muscle tone. Chest pain is mildly reproducible on palpation Skin: no rashes, lesions, ulcers. No induration Neurologic: CN 2-12 grossly intact. Sensation intact, DTR normal. Strength 5/5 in all 4.  Psychiatric: Normal judgment and insight. Alert and oriented x 3. Normal mood.    Labs on Admission: I have personally reviewed following labs and imaging studies  CBC: Recent Labs  Lab 01/04/19 1016  WBC 5.7  HGB 15.2  HCT 43.9  MCV 85.2  PLT 027   Basic Metabolic Panel: Recent Labs  Lab 01/04/19 1016  NA 138  K 3.7  CL 102  CO2 23  GLUCOSE 298*  BUN 16  CREATININE 1.08  CALCIUM 9.1   GFR: Estimated Creatinine Clearance: 101.3 mL/min (by C-G formula based on SCr of 1.08 mg/dL). Liver Function Tests: No results for input(s): AST, ALT, ALKPHOS, BILITOT, PROT, ALBUMIN in the last 168 hours. No results for input(s): LIPASE, AMYLASE in the last 168 hours. No results for input(s): AMMONIA in the last 168 hours. Coagulation Profile: No results for input(s): INR, PROTIME in the last 168 hours. Cardiac Enzymes: Recent Labs  Lab 01/04/19 1016 01/04/19 1137  TROPONINI <0.03 <0.03   BNP (last 3 results) No results for input(s): PROBNP in the last 8760 hours. HbA1C: No results for input(s): HGBA1C in the last 72 hours. CBG: No results for input(s): GLUCAP in the last 168 hours. Lipid Profile: No results for input(s): CHOL, HDL, LDLCALC, TRIG, CHOLHDL, LDLDIRECT in the last 72 hours. Thyroid Function Tests: No results for input(s): TSH, T4TOTAL, FREET4, T3FREE, THYROIDAB in the last 72 hours. Anemia Panel: No results for input(s): VITAMINB12, FOLATE, FERRITIN, TIBC, IRON, RETICCTPCT in the last 72 hours. Urine analysis:    Component Value Date/Time   COLORURINE YELLOW 09/11/2016 0216    APPEARANCEUR CLEAR 09/11/2016 0216   LABSPEC 1.024 09/11/2016 0216   PHURINE 5.0 09/11/2016 0216   GLUCOSEU >=500 (A) 09/11/2016 0216   HGBUR NEGATIVE 09/11/2016 0216   BILIRUBINUR NEGATIVE 09/11/2016 0216   KETONESUR 20 (A) 09/11/2016 0216   PROTEINUR NEGATIVE 09/11/2016 0216  NITRITE NEGATIVE 09/11/2016 0216   LEUKOCYTESUR NEGATIVE 09/11/2016 0216    Radiological Exams on Admission: Dg Chest 2 View  Result Date: 01/04/2019 CLINICAL DATA:  Ongoing chest pain. EXAM: CHEST - 2 VIEW COMPARISON:  July 12, 2018 FINDINGS: The heart size and mediastinal contours are within normal limits. Both lungs are clear. The visualized skeletal structures are unremarkable. IMPRESSION: No active cardiopulmonary disease. Electronically Signed   By: Dorise Bullion III M.D   On: 01/04/2019 10:51    EKG: Independently reviewed. NSR. No significant change from prior ekg.  Assessment/Plan Active Problems:   Hyperlipidemia LDL goal <70   Essential hypertension, benign   CAD S/P LAD DES March 2015   DM type 2 causing vascular disease (HCC)   Esophageal reflux   Chest pain   Anxiety     1. Chest pain.  With his previous history of coronary artery disease, there is concern for unstable angina.  He did not have much improvement with nitroglycerin or morphine to pain.  Some components of his presentation are typical and concerning for cardiac pain while others are atypical.  He has been started on intravenous heparin in the emergency room.  We will cycle cardiac markers.  Admit to stepdown unit in case nitroglycerin infusion is needed.  Check d-dimer.  Will consult cardiology in a.m.  Continue on aspirin, statin. 2. Coronary artery disease.  Last cardiac catheterization 02/2018.  Stents were patent at that time.  Continue on aspirin and cycle cardiac markers. 3. Diabetes.  Continue on home dose of basal insulin.  Supplement with sliding scale insulin. 4. Hyperlipidemia.  Continue statin 5. GERD.  Continue  on PPI 6. Anxiety.  Continue on Klonopin.  DVT prophylaxis: heparin infusion  Code Status: full code  Family Communication: discussed with patient  Disposition Plan: discharge home once work up complete  Consults called: cardiology  Admission status: observation, sdu   Kathie Dike MD [facility]   If 7PM-7AM, please contact night-coverage [email or web address]   01/04/2019, 1:42 PM

## 2019-01-04 NOTE — Progress Notes (Signed)
ANTICOAGULATION CONSULT NOTE - Initial Consult  Pharmacy Consult for heparin gtt  Indication: chest pain/ACS  Allergies  Allergen Reactions  . Metoprolol Rash    Patient Measurements: Height: 6' 2"  (188 cm) Weight: 245 lb (111.1 kg) IBW/kg (Calculated) : 82.2 Heparin Dosing Weight: HEPARIN DW (KG): 105.3   Vital Signs: Temp: 98.2 F (36.8 C) (04/19 1015) Temp Source: Oral (04/19 1015) BP: 124/81 (04/19 1230) Pulse Rate: 85 (04/19 1230)  Labs: Recent Labs    01/04/19 1016 01/04/19 1137  HGB 15.2  --   HCT 43.9  --   PLT 152  --   CREATININE 1.08  --   TROPONINI <0.03 <0.03    Estimated Creatinine Clearance: 101.3 mL/min (by C-G formula based on SCr of 1.08 mg/dL).   Medical History: Past Medical History:  Diagnosis Date  . Anxiety   . 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 Coronary atherosclerosis of native coronary artery    a. 12/09/2013: s/p PCI with 3.0 x 28 Promus DES to mLAD  . Cyst (solitary) of breast 10/03/2017   removed from back  . Depression   . Elbow injury 07/25/2017   surgery for torn tendon  . Essential hypertension, benign   . Fatty liver disease, nonalcoholic   . Lateral epicondylitis of right elbow   . Mixed hyperlipidemia 2006  . Obesity   . Osgood-Schlatter's disease of right knee   . Type 2 diabetes mellitus (HCC)     Medications:  (Not in a hospital admission)  Scheduled:   Infusions:  . heparin 1,450 Units/hr (01/04/19 1142)   PRN: nitroGLYCERIN Anti-infectives (From admission, onward)   None      Assessment: Jared Griffin a 58 y.o. male requires anticoagulation with a heparin iv infusion for the indication of  chest pain/ACS. Heparin gtt will be started following pharmacy protocol per pharmacy consult. Patient is not on previous oral anticoagulant that will require aPTT/HL correlation before transitioning to only HL monitoring.   Goal of Therapy:  Heparin level 0.3-0.7 units/ml Monitor  platelets by anticoagulation protocol: Yes   Plan:  Give 4000 units bolus x 1 Start heparin infusion at 1450 units/hr Check anti-Xa level in 6 hours and daily while on heparin Continue to monitor H&H and platelets  Heparin level to be drawn in 6 hours. 8 hours for patients >45 years old or crcl < 7m/min  Jared Griffin 01/04/2019,12:37 PM

## 2019-01-04 NOTE — Progress Notes (Signed)
ANTICOAGULATION CONSULT NOTE - Initial Consult  Pharmacy Consult for heparin gtt  Indication: chest pain/ACS  Allergies  Allergen Reactions  . Metoprolol Rash    Patient Measurements: Height: 6' 2"  (188 cm) Weight: 245 lb (111.1 kg) IBW/kg (Calculated) : 82.2 Heparin Dosing Weight: HEPARIN DW (KG): 105.3   Vital Signs: Temp: 98.9 F (37.2 C) (04/19 1625) Temp Source: Oral (04/19 1625) BP: 110/81 (04/19 1615) Pulse Rate: 78 (04/19 1615)  Labs: Recent Labs    01/04/19 1016 01/04/19 1137 01/04/19 1503 01/04/19 1745  HGB 15.2  --   --   --   HCT 43.9  --   --   --   PLT 152  --   --   --   HEPARINUNFRC  --   --   --  0.20*  CREATININE 1.08  --   --   --   TROPONINI <0.03 <0.03 <0.03  --     Estimated Creatinine Clearance: 101.3 mL/min (by C-G formula based on SCr of 1.08 mg/dL).   Medical History: Past Medical History:  Diagnosis Date  . Anxiety   . 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 Coronary atherosclerosis of native coronary artery    a. 12/09/2013: s/p PCI with 3.0 x 28 Promus DES to mLAD  . Cyst (solitary) of breast 10/03/2017   removed from back  . Depression   . Elbow injury 07/25/2017   surgery for torn tendon  . Essential hypertension, benign   . Fatty liver disease, nonalcoholic   . Lateral epicondylitis of right elbow   . Mixed hyperlipidemia 2006  . Obesity   . Osgood-Schlatter's disease of right knee   . Type 2 diabetes mellitus (H[facility])     Medications:  Medications Prior to Admission  Medication Sig Dispense Refill Last Dose  . albuterol (PROVENTIL HFA;VENTOLIN HFA) 108 (90 Base) MCG/ACT inhaler Inhale 2 puffs into the lungs every 6 (six) hours as needed for wheezing or shortness of breath. 1 Inhaler 2 01/03/2019 at Unknown time  . aspirin (ASPIR-LOW) 81 MG EC tablet Take 1 tablet (81 mg total) by mouth daily. 30 tablet 12 01/04/2019 at Unknown time  . atorvastatin (LIPITOR) 40 MG tablet TAKE ONE (1) TABLET BY MOUTH EVERY DAY  90 tablet 3 01/04/2019 at Unknown time  . clonazePAM (KLONOPIN) 0.5 MG tablet Take 1 tablet (0.5 mg total) by mouth 3 (three) times daily as needed for anxiety. 90 tablet 2 01/04/2019 at Unknown time  . diltiazem (CARDIZEM) 30 MG tablet TAKE (1) TABLET BY MOUTH TWICE DAILY. 180 tablet 0 01/04/2019 at Unknown time  . gabapentin (NEURONTIN) 300 MG capsule TAKE 1 CAPSULE BY MOUTH FOUR TIMES DAILY. 120 capsule 0 01/04/2019 at Unknown time  . glucose blood test strip 1 each by Other route 4 (four) times daily. Use as instructed qid. E11.65. One touch Ultra 150 each 5 01/04/2019 at Unknown time  . HYDROcodone-acetaminophen (NORCO) 5-325 MG tablet Take 1 tablet by mouth every 6 (six) hours as needed for moderate pain. 24 tablet 0 01/03/2019 at Unknown time  . Insulin Glargine, 2 Unit Dial, (TOUJEO MAX SOLOSTAR) 300 UNIT/ML SOPN Inject 90 Units into the skin at bedtime. 4 pen 2 01/03/2019 at Unknown time  . Lancets MISC 1 each by Does not apply route 4 (four) times daily. 150 each 5 01/04/2019 at Unknown time  . losartan (COZAAR) 25 MG tablet TAKE (1) TABLET BY MOUTH ONCE DAILY. 90 tablet 0 01/04/2019 at Unknown time  . meclizine (  ANTIVERT) 25 MG tablet Take 1 tablet (25 mg total) by mouth 3 (three) times daily as needed for dizziness. 30 tablet 0 Past Month at Unknown time  . metFORMIN (GLUCOPHAGE) 500 MG tablet TAKE 2 TABLETS IN THE MORNING, 1 TABLET AT NOON, AND 2 TABLETS IN THEEVENING. 450 tablet 0 01/04/2019 at Unknown time  . methocarbamol (ROBAXIN) 500 MG tablet Take 1 tablet (500 mg total) by mouth 3 (three) times daily. 60 tablet 1 01/04/2019 at Unknown time  . nitroGLYCERIN (NITROSTAT) 0.4 MG SL tablet Place 1 tablet (0.4 mg total) under the tongue every 5 (five) minutes as needed for chest pain. 25 tablet 3 01/04/2019 at Unknown time  . pantoprazole (PROTONIX) 40 MG tablet Take one daily 90 tablet 1 01/04/2019 at Unknown time  . ranolazine (RANEXA) 500 MG 12 hr tablet TAKE (1) TABLET BY MOUTH TWICE DAILY. 60  tablet 3 01/04/2019 at Unknown time  . sertraline (ZOLOFT) 100 MG tablet Take 1 tablet (100 mg total) by mouth daily. 90 tablet 2 01/04/2019 at Unknown time  . fluticasone (FLOVENT HFA) 44 MCG/ACT inhaler Inhale 2 puffs into the lungs 2 (two) times daily. 1 Inhaler 5 Unknown at Unknown time  . Icosapent Ethyl (VASCEPA) 1 g CAPS Take 2 capsules (2 g total) by mouth 2 (two) times daily. (Patient not taking: Reported on 01/04/2019) 360 capsule 3 Not Taking at Unknown time   Scheduled:  . aspirin EC  81 mg Oral Daily  . atorvastatin  40 mg Oral q1800  . budesonide (PULMICORT) nebulizer solution  0.5 mg Nebulization BID  . [START ON 01/05/2019] Chlorhexidine Gluconate Cloth  6 each Topical Q0600  . diltiazem  30 mg Oral Q12H  . insulin aspart  0-15 Units Subcutaneous TID WC  . insulin aspart  0-5 Units Subcutaneous QHS  . insulin glargine  90 Units Subcutaneous QHS  . losartan  25 mg Oral Daily  . methocarbamol  500 mg Oral TID  . pantoprazole  40 mg Oral BID AC  . ranolazine  500 mg Oral BID  . sertraline  100 mg Oral Daily   Infusions:  . heparin 1,450 Units/hr (01/04/19 1500)   PRN: acetaminophen, acetaminophen, clonazePAM, HYDROcodone-acetaminophen, meclizine, morphine injection, nitroGLYCERIN, ondansetron (ZOFRAN) IV Anti-infectives (From admission, onward)   None      Assessment: Jared Griffin a 58 y.o. male requires anticoagulation with a heparin iv infusion for the indication of  chest pain/ACS. Heparin gtt will be started following pharmacy protocol per pharmacy consult. Patient is not on previous oral anticoagulant that will require aPTT/HL correlation before transitioning to only HL monitoring.    HL 0.2 subtherapeutic   Goal of Therapy:  Heparin level 0.3-0.7 units/ml Monitor platelets by anticoagulation protocol: Yes   Plan:   Increase heparin infusion to 1750 units/hr Check anti-Xa level in 6 hours and daily while on heparin Continue to monitor H&H and  platelets  Heparin level to be drawn in 6 hours Cordale Manera 01/04/2019,6:34 PM

## 2019-01-04 NOTE — ED Notes (Signed)
Patient transported to X-ray 

## 2019-01-04 NOTE — ED Triage Notes (Signed)
Pt reports ongoing chest pain for about a week intermittently. Yesterday became more constant and began to radiate toward left neck.  Took 2 nitro this morning with some relief from pain.

## 2019-01-05 ENCOUNTER — Ambulatory Visit (INDEPENDENT_AMBULATORY_CARE_PROVIDER_SITE_OTHER): Payer: Self-pay | Admitting: Internal Medicine

## 2019-01-05 DIAGNOSIS — R079 Chest pain, unspecified: Secondary | ICD-10-CM | POA: Diagnosis not present

## 2019-01-05 DIAGNOSIS — E785 Hyperlipidemia, unspecified: Secondary | ICD-10-CM | POA: Diagnosis not present

## 2019-01-05 DIAGNOSIS — E1165 Type 2 diabetes mellitus with hyperglycemia: Secondary | ICD-10-CM

## 2019-01-05 DIAGNOSIS — I2510 Atherosclerotic heart disease of native coronary artery without angina pectoris: Secondary | ICD-10-CM | POA: Diagnosis not present

## 2019-01-05 DIAGNOSIS — K219 Gastro-esophageal reflux disease without esophagitis: Secondary | ICD-10-CM | POA: Diagnosis not present

## 2019-01-05 DIAGNOSIS — I10 Essential (primary) hypertension: Secondary | ICD-10-CM | POA: Diagnosis not present

## 2019-01-05 DIAGNOSIS — E1159 Type 2 diabetes mellitus with other circulatory complications: Secondary | ICD-10-CM | POA: Diagnosis not present

## 2019-01-05 LAB — CBC
HCT: 43.7 % (ref 39.0–52.0)
Hemoglobin: 14.8 g/dL (ref 13.0–17.0)
MCH: 29.1 pg (ref 26.0–34.0)
MCHC: 33.9 g/dL (ref 30.0–36.0)
MCV: 86 fL (ref 80.0–100.0)
Platelets: 151 10*3/uL (ref 150–400)
RBC: 5.08 MIL/uL (ref 4.22–5.81)
RDW: 13.2 % (ref 11.5–15.5)
WBC: 7.5 10*3/uL (ref 4.0–10.5)
nRBC: 0 % (ref 0.0–0.2)

## 2019-01-05 LAB — TROPONIN I: Troponin I: <0.03 ng/mL (ref <0.03)

## 2019-01-05 LAB — GLUCOSE, CAPILLARY
Glucose-Capillary: 132 mg/dL — ABNORMAL HIGH (ref 70–99)
Glucose-Capillary: 119 mg/dL — ABNORMAL HIGH (ref 70–99)

## 2019-01-05 LAB — HEPARIN LEVEL (UNFRACTIONATED)
Heparin Unfractionated: 0.42 IU/mL (ref 0.30–0.70)
Heparin Unfractionated: 0.54 IU/mL (ref 0.30–0.70)

## 2019-01-05 MED ORDER — RANOLAZINE ER 1000 MG PO TB12: 1000.0000 mg | ORAL_TABLET | Freq: Two times a day (BID) | ORAL | 0 refills | Status: DC

## 2019-01-05 MED ORDER — RANOLAZINE ER 500 MG PO TB12: 1000.0000 mg | ORAL_TABLET | Freq: Two times a day (BID) | ORAL | Status: DC

## 2019-01-05 MED ORDER — ACETAMINOPHEN 325 MG PO TABS: 650.0000 mg | ORAL_TABLET | ORAL | Status: DC | PRN

## 2019-01-05 NOTE — Progress Notes (Signed)
Returned Games developer and cellphone to pt, denies any belongings in safe. D/c instructions reviewed with pt, verbalizes understanding of new medication change and follow up care. Denies any questions or concerns at this time. Pt is ambulatory, A&O X4, calling wife for transport home. Denies CP at this time.

## 2019-01-05 NOTE — Discharge Summary (Signed)
Physician Discharge Summary  Jared Griffin NUU:[id number] DOB: 19-Jul-1961 DOA: 01/04/2019  PCP: Mikey Kirschner, MD  Cardiologist: Dr. Bronson Ing  Admit date: 01/04/2019 Discharge date: 01/05/2019  Admitted From: Home  Disposition: Home   Recommendations for Outpatient Follow-up:  1. Follow up with PCP in 1 weeks 2. Follow up with cardiology in 1 month  Discharge Condition: STABLE   CODE STATUS: FULL    Brief Hospitalization Summary: Please see all hospital notes, images, labs for full details of the hospitalization. Dr. Blythe Stanford HPI: Jared Griffin is a 58 y.o. male with medical history significant of coronary artery disease with prior stents, diabetes, hypertension, anxiety, presents to the hospital with complaints of chest comfort.  Reports onset of symptoms occurring for the past 2 days.  Describes shooting pain in his left lower chest which radiates up his sternum and around his back.  This is associated with shortness of breath, nausea.  At night he does have diaphoresis.  Symptoms do occur at rest.  He feels the symptoms were similar to those that he had when he had stents placed.  He is not had any fever.  He has a chronic cough that is unchanged.  He has not had any vomiting or diarrhea.  ED Course: EKG done in the emergency room did not show any acute changes.  Troponin was negative.  Basic labs were unrevealing.  Chest x-ray did not show any acute findings.  Due to his significant history, he has been referred for observation.  The patient was monitored in the hospital in the stepdown ICU for close monitoring.  He remained stable and his chest pain symptoms have completely resolved.  His troponins have tested serially less than 0.03.  He has had no EKG changes or findings that have been concerning for cardiac cause of his symptoms.  He was seen by his cardiologist and they have increased his Ranexa to 1000 mg twice daily.  The patient is being discharged home in stable  condition to have further outpatient follow-up for his symptoms.  He has outpatient follow-up with his endocrinologist, PCP and other subspecialists.   Discharge Diagnoses:  Active Problems:   Hyperlipidemia LDL goal <70   Essential hypertension, benign   CAD S/P LAD DES March 2015   DM type 2 causing vascular disease (HCC)   Esophageal reflux   Chest pain   Anxiety   Discharge Instructions: Discharge Instructions    Call MD for:  difficulty breathing, headache or visual disturbances   Complete by:  As directed    Call MD for:  extreme fatigue   Complete by:  As directed    Call MD for:  persistant dizziness or light-headedness   Complete by:  As directed    Call MD for:  persistant nausea and vomiting   Complete by:  As directed    Call MD for:  severe uncontrolled pain   Complete by:  As directed    Increase activity slowly   Complete by:  As directed      Allergies as of 01/05/2019      Reactions   Metoprolol Rash      Medication List    TAKE these medications   albuterol 108 (90 Base) MCG/ACT inhaler Commonly known as:  VENTOLIN HFA Inhale 2 puffs into the lungs every 6 (six) hours as needed for wheezing or shortness of breath.   Aspir-Low 81 MG EC tablet Generic drug:  aspirin Take 1 tablet (81 mg total) by mouth  daily.   atorvastatin 40 MG tablet Commonly known as:  LIPITOR TAKE ONE (1) TABLET BY MOUTH EVERY DAY   clonazePAM 0.5 MG tablet Commonly known as:  KlonoPIN Take 1 tablet (0.5 mg total) by mouth 3 (three) times daily as needed for anxiety.   diltiazem 30 MG tablet Commonly known as:  CARDIZEM TAKE (1) TABLET BY MOUTH TWICE DAILY.   fluticasone 44 MCG/ACT inhaler Commonly known as:  Flovent HFA Inhale 2 puffs into the lungs 2 (two) times daily.   gabapentin 300 MG capsule Commonly known as:  NEURONTIN TAKE 1 CAPSULE BY MOUTH FOUR TIMES DAILY.   glucose blood test strip 1 each by Other route 4 (four) times daily. Use as instructed qid.  E11.65. One touch Ultra   HYDROcodone-acetaminophen 5-325 MG tablet Commonly known as:  Norco Take 1 tablet by mouth every 6 (six) hours as needed for moderate pain.   Insulin Glargine (2 Unit Dial) 300 UNIT/ML Sopn Commonly known as:  Toujeo Max SoloStar Inject 90 Units into the skin at bedtime.   Lancets Misc 1 each by Does not apply route 4 (four) times daily.   losartan 25 MG tablet Commonly known as:  COZAAR TAKE (1) TABLET BY MOUTH ONCE DAILY.   meclizine 25 MG tablet Commonly known as:  ANTIVERT Take 1 tablet (25 mg total) by mouth 3 (three) times daily as needed for dizziness.   metFORMIN 500 MG tablet Commonly known as:  GLUCOPHAGE TAKE 2 TABLETS IN THE MORNING, 1 TABLET AT NOON, AND 2 TABLETS IN THEEVENING.   methocarbamol 500 MG tablet Commonly known as:  ROBAXIN Take 1 tablet (500 mg total) by mouth 3 (three) times daily.   nitroGLYCERIN 0.4 MG SL tablet Commonly known as:  NITROSTAT Place 1 tablet (0.4 mg total) under the tongue every 5 (five) minutes as needed for chest pain.   pantoprazole 40 MG tablet Commonly known as:  PROTONIX Take one daily   ranolazine 1000 MG SR tablet Commonly known as:  RANEXA Take 1 tablet (1,000 mg total) by mouth 2 (two) times daily for 30 days. What changed:    medication strength  See the new instructions.   sertraline 100 MG tablet Commonly known as:  ZOLOFT Take 1 tablet (100 mg total) by mouth daily.      Follow-up Information    Mikey Kirschner, MD. Schedule an appointment as soon as possible for a visit in 1 week(s).   Specialty:  Family Medicine Why:  Hospital Follow Up  Contact information: [place] [address] [phone]        Herminio Commons, MD. Schedule an appointment as soon as possible for a visit in 1 month(s).   Specialty:  Cardiology Why:  Hospital Follow Up  Contact information: [place] [address] [phone]          Allergies   Allergen Reactions  . Metoprolol Rash   Allergies as of 01/05/2019      Reactions   Metoprolol Rash      Medication List    TAKE these medications   albuterol 108 (90 Base) MCG/ACT inhaler Commonly known as:  VENTOLIN HFA Inhale 2 puffs into the lungs every 6 (six) hours as needed for wheezing or shortness of breath.   Aspir-Low 81 MG EC tablet Generic drug:  aspirin Take 1 tablet (81 mg total) by mouth daily.   atorvastatin 40 MG tablet Commonly known as:  LIPITOR TAKE ONE (1)  TABLET BY MOUTH EVERY DAY   clonazePAM 0.5 MG tablet Commonly known as:  KlonoPIN Take 1 tablet (0.5 mg total) by mouth 3 (three) times daily as needed for anxiety.   diltiazem 30 MG tablet Commonly known as:  CARDIZEM TAKE (1) TABLET BY MOUTH TWICE DAILY.   fluticasone 44 MCG/ACT inhaler Commonly known as:  Flovent HFA Inhale 2 puffs into the lungs 2 (two) times daily.   gabapentin 300 MG capsule Commonly known as:  NEURONTIN TAKE 1 CAPSULE BY MOUTH FOUR TIMES DAILY.   glucose blood test strip 1 each by Other route 4 (four) times daily. Use as instructed qid. E11.65. One touch Ultra   HYDROcodone-acetaminophen 5-325 MG tablet Commonly known as:  Norco Take 1 tablet by mouth every 6 (six) hours as needed for moderate pain.   Insulin Glargine (2 Unit Dial) 300 UNIT/ML Sopn Commonly known as:  Toujeo Max SoloStar Inject 90 Units into the skin at bedtime.   Lancets Misc 1 each by Does not apply route 4 (four) times daily.   losartan 25 MG tablet Commonly known as:  COZAAR TAKE (1) TABLET BY MOUTH ONCE DAILY.   meclizine 25 MG tablet Commonly known as:  ANTIVERT Take 1 tablet (25 mg total) by mouth 3 (three) times daily as needed for dizziness.   metFORMIN 500 MG tablet Commonly known as:  GLUCOPHAGE TAKE 2 TABLETS IN THE MORNING, 1 TABLET AT NOON, AND 2 TABLETS IN THEEVENING.   methocarbamol 500 MG tablet Commonly known as:  ROBAXIN Take 1 tablet (500 mg total) by mouth 3  (three) times daily.   nitroGLYCERIN 0.4 MG SL tablet Commonly known as:  NITROSTAT Place 1 tablet (0.4 mg total) under the tongue every 5 (five) minutes as needed for chest pain.   pantoprazole 40 MG tablet Commonly known as:  PROTONIX Take one daily   ranolazine 1000 MG SR tablet Commonly known as:  RANEXA Take 1 tablet (1,000 mg total) by mouth 2 (two) times daily for 30 days. What changed:    medication strength  See the new instructions.   sertraline 100 MG tablet Commonly known as:  ZOLOFT Take 1 tablet (100 mg total) by mouth daily.       Procedures/Studies: Dg Chest 2 View  Result Date: 01/04/2019 CLINICAL DATA:  Ongoing chest pain. EXAM: CHEST - 2 VIEW COMPARISON:  July 12, 2018 FINDINGS: The heart size and mediastinal contours are within normal limits. Both lungs are clear. The visualized skeletal structures are unremarkable. IMPRESSION: No active cardiopulmonary disease. Electronically Signed   By: Dorise Bullion III M.D   On: 01/04/2019 10:51      Subjective: The patient says that he feels fine.  He is not having any symptoms of chest pain or shortness of breath.  Discharge Exam: Vitals:   01/05/19 0900 01/05/19 1000  BP: 124/71 (!) 133/92  Pulse: 77 78  Resp: 14 13  Temp:    SpO2: 93% 97%   Vitals:   01/05/19 0745 01/05/19 0800 01/05/19 0900 01/05/19 1000  BP:  (!) 128/93 124/71 (!) 133/92  Pulse:  81 77 78  Resp:  20 14 13   Temp:      TempSrc:      SpO2: 97% 91% 93% 97%  Weight:      Height:       General: Pt is alert, awake, not in acute distress Cardiovascular: RRR, S1/S2 +, no rubs, no gallops Respiratory: CTA bilaterally, no wheezing, no rhonchi Abdominal: Soft, NT, ND,  bowel sounds + Extremities: no edema, no cyanosis   The results of significant diagnostics from this hospitalization (including imaging, microbiology, ancillary and laboratory) are listed below for reference.     Microbiology: Recent Results (from the past 240  hour(s))  MRSA PCR Screening     Status: Abnormal   Collection Time: 01/04/19  2:30 PM  Result Value Ref Range Status   MRSA by PCR POSITIVE (A) NEGATIVE Final    Comment:        The GeneXpert MRSA Assay (FDA approved for NASAL specimens only), is one component of a comprehensive MRSA colonization surveillance program. It is not intended to diagnose MRSA infection nor to guide or monitor treatment for MRSA infections. RESULT CALLED TO, READ BACK BY AND VERIFIED WITH: WAGNER,R @ 7482 ON 01/04/19 BY JUW Performed at [facility], [address] [place], [place] [address]      Labs: BNP (last 3 results) No results for input(s): BNP in the last 8760 hours. Basic Metabolic Panel: Recent Labs  Lab 01/04/19 1016  NA 138  K 3.7  CL 102  CO2 23  GLUCOSE 298*  BUN 16  CREATININE 1.08  CALCIUM 9.1   Liver Function Tests: No results for input(s): AST, ALT, ALKPHOS, BILITOT, PROT, ALBUMIN in the last 168 hours. No results for input(s): LIPASE, AMYLASE in the last 168 hours. No results for input(s): AMMONIA in the last 168 hours. CBC: Recent Labs  Lab 01/04/19 1016 01/05/19 0235  WBC 5.7 7.5  HGB 15.2 14.8  HCT 43.9 43.7  MCV 85.2 86.0  PLT 152 151   Cardiac Enzymes: Recent Labs  Lab 01/04/19 1016 01/04/19 1137 01/04/19 1503 01/04/19 2024 01/05/19 0235  TROPONINI <0.03 <0.03 <0.03 <0.03 <0.03   BNP: Invalid input(s): POCBNP CBG: Recent Labs  Lab 01/04/19 1628 01/04/19 2136 01/05/19 0718  GLUCAP 167* 220* 132*   D-Dimer Recent Labs    01/04/19 1503  DDIMER <0.27   Hgb A1c No results for input(s): HGBA1C in the last 72 hours. Lipid Profile No results for input(s): CHOL, HDL, LDLCALC, TRIG, CHOLHDL, LDLDIRECT in the last 72 hours. Thyroid function studies No results for input(s): TSH, T4TOTAL, T3FREE, THYROIDAB in the last 72 hours.  Invalid input(s): FREET3 Anemia work up No results for input(s): VITAMINB12, FOLATE, FERRITIN, TIBC, IRON,  RETICCTPCT in the last 72 hours. Urinalysis    Component Value Date/Time   COLORURINE YELLOW 09/11/2016 0216   APPEARANCEUR CLEAR 09/11/2016 0216   LABSPEC 1.024 09/11/2016 0216   PHURINE 5.0 09/11/2016 0216   GLUCOSEU >=500 (A) 09/11/2016 0216   HGBUR NEGATIVE 09/11/2016 0216   BILIRUBINUR NEGATIVE 09/11/2016 0216   KETONESUR 20 (A) 09/11/2016 0216   PROTEINUR NEGATIVE 09/11/2016 0216   NITRITE NEGATIVE 09/11/2016 0216   LEUKOCYTESUR NEGATIVE 09/11/2016 0216   Sepsis Labs Invalid input(s): PROCALCITONIN,  WBC,  LACTICIDVEN Microbiology Recent Results (from the past 240 hour(s))  MRSA PCR Screening     Status: Abnormal   Collection Time: 01/04/19  2:30 PM  Result Value Ref Range Status   MRSA by PCR POSITIVE (A) NEGATIVE Final    Comment:        The GeneXpert MRSA Assay (FDA approved for NASAL specimens only), is one component of a comprehensive MRSA colonization surveillance program. It is not intended to diagnose MRSA infection nor to guide or monitor treatment for MRSA infections. RESULT CALLED TO, READ BACK BY AND VERIFIED WITH: WAGNER,R @ 2329 ON 01/04/19 BY JUW Performed at [facility], [address] [place],  [address]    Time coordinating discharge:   SIGNED:  Irwin Brakeman, MD  [facility] 01/05/2019, 11:17 AM How to contact the [facility] Attending or Consulting provider [place] or covering provider during after hours [place], for this patient?  1. Check the care team in [facility] and look for a) attending/consulting [facility] provider listed and b) the [facility] team listed 2. Log into [email or web address] and use [place]'s universal password to access. If you do not have the password, please contact the hospital operator. 3. Locate the [facility] provider you are looking for under [facility] and page to a number that you can be directly reached. 4. If you still have difficulty reaching the provider, please page the [facility] (Director on Call) for the Hospitalists listed  on amion for assistance.

## 2019-01-05 NOTE — Progress Notes (Signed)
ANTICOAGULATION CONSULT NOTE - Follow Up Consult  Pharmacy Consult for heparin Indication: chest pain/ACS  Labs: Recent Labs    01/04/19 1016 01/04/19 1137 01/04/19 1503 01/04/19 1745 01/04/19 2024 01/05/19 0009  HGB 15.2  --   --   --   --   --   HCT 43.9  --   --   --   --   --   PLT 152  --   --   --   --   --   HEPARINUNFRC  --   --   --  0.20*  --  0.42  CREATININE 1.08  --   --   --   --   --   TROPONINI <0.03 <0.03 <0.03  --  <0.03  --     Assessment/Plan:  58yo male therapeutic on heparin after rate change. Will continue gtt at current rate and confirm stable with additional level.   Wynona Neat, PharmD, BCPS  01/05/2019,12:40 AM

## 2019-01-05 NOTE — Progress Notes (Signed)
ANTICOAGULATION CONSULT NOTE - Initial Consult  Pharmacy Consult for heparin gtt  Indication: chest pain/ACS  Allergies  Allergen Reactions  . Metoprolol Rash    Patient Measurements: Height: 6' 2"  (188 cm) Weight: 245 lb (111.1 kg) IBW/kg (Calculated) : 82.2 Heparin Dosing Weight: HEPARIN DW (KG): 105.3   Vital Signs: Temp: 98.6 F (37 C) (04/20 0716) Temp Source: Oral (04/20 0716) BP: 128/93 (04/20 0800) Pulse Rate: 81 (04/20 0800)  Labs: Recent Labs    01/04/19 1016  01/04/19 1503 01/04/19 1745 01/04/19 2024 01/05/19 0009 01/05/19 0235 01/05/19 0827  HGB 15.2  --   --   --   --   --  14.8  --   HCT 43.9  --   --   --   --   --  43.7  --   PLT 152  --   --   --   --   --  151  --   HEPARINUNFRC  --   --   --  0.20*  --  0.42  --  0.54  CREATININE 1.08  --   --   --   --   --   --   --   TROPONINI <0.03   < > <0.03  --  <0.03  --  <0.03  --    < > = values in this interval not displayed.    Estimated Creatinine Clearance: 101.3 mL/min (by C-G formula based on SCr of 1.08 mg/dL).   Medical History: Past Medical History:  Diagnosis Date  . Anxiety   . 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 Coronary atherosclerosis of native coronary artery    a. 12/09/2013: s/p PCI with 3.0 x 28 Promus DES to mLAD  . Cyst (solitary) of breast 10/03/2017   removed from back  . Depression   . Elbow injury 07/25/2017   surgery for torn tendon  . Essential hypertension, benign   . Fatty liver disease, nonalcoholic   . Lateral epicondylitis of right elbow   . Mixed hyperlipidemia 2006  . Obesity   . Osgood-Schlatter's disease of right knee   . Type 2 diabetes mellitus (HCC)     Medications:  Medications Prior to Admission  Medication Sig Dispense Refill Last Dose  . albuterol (PROVENTIL HFA;VENTOLIN HFA) 108 (90 Base) MCG/ACT inhaler Inhale 2 puffs into the lungs every 6 (six) hours as needed for wheezing or shortness of breath. 1 Inhaler 2 01/03/2019 at  Unknown time  . aspirin (ASPIR-LOW) 81 MG EC tablet Take 1 tablet (81 mg total) by mouth daily. 30 tablet 12 01/04/2019 at Unknown time  . atorvastatin (LIPITOR) 40 MG tablet TAKE ONE (1) TABLET BY MOUTH EVERY DAY 90 tablet 3 01/04/2019 at Unknown time  . clonazePAM (KLONOPIN) 0.5 MG tablet Take 1 tablet (0.5 mg total) by mouth 3 (three) times daily as needed for anxiety. 90 tablet 2 01/04/2019 at Unknown time  . diltiazem (CARDIZEM) 30 MG tablet TAKE (1) TABLET BY MOUTH TWICE DAILY. 180 tablet 0 01/04/2019 at Unknown time  . gabapentin (NEURONTIN) 300 MG capsule TAKE 1 CAPSULE BY MOUTH FOUR TIMES DAILY. 120 capsule 0 01/04/2019 at Unknown time  . glucose blood test strip 1 each by Other route 4 (four) times daily. Use as instructed qid. E11.65. One touch Ultra 150 each 5 01/04/2019 at Unknown time  . HYDROcodone-acetaminophen (NORCO) 5-325 MG tablet Take 1 tablet by mouth every 6 (six) hours as needed for moderate pain. 24 tablet  0 01/03/2019 at Unknown time  . Insulin Glargine, 2 Unit Dial, (TOUJEO MAX SOLOSTAR) 300 UNIT/ML SOPN Inject 90 Units into the skin at bedtime. 4 pen 2 01/03/2019 at Unknown time  . Lancets MISC 1 each by Does not apply route 4 (four) times daily. 150 each 5 01/04/2019 at Unknown time  . losartan (COZAAR) 25 MG tablet TAKE (1) TABLET BY MOUTH ONCE DAILY. 90 tablet 0 01/04/2019 at Unknown time  . meclizine (ANTIVERT) 25 MG tablet Take 1 tablet (25 mg total) by mouth 3 (three) times daily as needed for dizziness. 30 tablet 0 Past Month at Unknown time  . metFORMIN (GLUCOPHAGE) 500 MG tablet TAKE 2 TABLETS IN THE MORNING, 1 TABLET AT NOON, AND 2 TABLETS IN THEEVENING. 450 tablet 0 01/04/2019 at Unknown time  . methocarbamol (ROBAXIN) 500 MG tablet Take 1 tablet (500 mg total) by mouth 3 (three) times daily. 60 tablet 1 01/04/2019 at Unknown time  . nitroGLYCERIN (NITROSTAT) 0.4 MG SL tablet Place 1 tablet (0.4 mg total) under the tongue every 5 (five) minutes as needed for chest pain. 25  tablet 3 01/04/2019 at Unknown time  . pantoprazole (PROTONIX) 40 MG tablet Take one daily 90 tablet 1 01/04/2019 at Unknown time  . ranolazine (RANEXA) 500 MG 12 hr tablet TAKE (1) TABLET BY MOUTH TWICE DAILY. 60 tablet 3 01/04/2019 at Unknown time  . sertraline (ZOLOFT) 100 MG tablet Take 1 tablet (100 mg total) by mouth daily. 90 tablet 2 01/04/2019 at Unknown time  . fluticasone (FLOVENT HFA) 44 MCG/ACT inhaler Inhale 2 puffs into the lungs 2 (two) times daily. 1 Inhaler 5 Unknown at Unknown time  . Icosapent Ethyl (VASCEPA) 1 g CAPS Take 2 capsules (2 g total) by mouth 2 (two) times daily. (Patient not taking: Reported on 01/04/2019) 360 capsule 3 Not Taking at Unknown time   Scheduled:  . aspirin EC  81 mg Oral Daily  . atorvastatin  40 mg Oral q1800  . budesonide (PULMICORT) nebulizer solution  0.5 mg Nebulization BID  . Chlorhexidine Gluconate Cloth  6 each Topical Q0600  . Chlorhexidine Gluconate Cloth  6 each Topical Q0600  . diltiazem  30 mg Oral Q12H  . insulin aspart  0-15 Units Subcutaneous TID WC  . insulin aspart  0-5 Units Subcutaneous QHS  . insulin glargine  90 Units Subcutaneous QHS  . losartan  25 mg Oral Daily  . methocarbamol  500 mg Oral TID  . mupirocin ointment  1 application Nasal BID  . pantoprazole  40 mg Oral BID AC  . ranolazine  500 mg Oral BID  . sertraline  100 mg Oral Daily   Infusions:  . heparin 1,750 Units/hr (01/05/19 0800)   PRN: acetaminophen, acetaminophen, clonazePAM, HYDROcodone-acetaminophen, meclizine, morphine injection, nitroGLYCERIN, ondansetron (ZOFRAN) IV Anti-infectives (From admission, onward)   None      Assessment: Jared Griffin a 58 y.o. male requires anticoagulation with a heparin iv infusion for the indication of  chest pain/ACS. Heparin gtt will be started following pharmacy protocol per pharmacy consult. Patient is not on previous oral anticoagulant that will require aPTT/HL correlation before transitioning to only HL  monitoring.    HL 0.54 therapeutic   Goal of Therapy:  Heparin level 0.3-0.7 units/ml Monitor platelets by anticoagulation protocol: Yes   Plan:   Continue heparin infusion to 1750 units/hr Check anti-Xa level daily while on heparin Continue to monitor H&H and platelets  Donna Christen Hayzlee Mcsorley 01/05/2019,9:27 AM

## 2019-01-05 NOTE — Consult Note (Signed)
Cardiology Consultation:   Patient ID: Jared Griffin MRN: [id number]; DOB: 1961-04-26  Admit date: 01/04/2019 Date of Consult: 01/05/2019  Primary Care Provider: Mikey Kirschner, MD Primary Cardiologist: Kate Sable, MD  Primary Electrophysiologist:  None    Patient Profile:   Jared Griffin is a 58 y.o. male with a hx of CAD who is being seen today for the evaluation of chest pain at the request of Dr. Roderic Palau.  History of Present Illness:   Jared Griffin is a 58 year old male with a history of coronary artery disease who is well-known to me from the outpatient setting.  He underwent coronary angiography on 02/26/2018 most recently which demonstrated a widely patent LAD stent with minimal restenosis.  LVEDP was normal as was left ventricular systolic function, LVEF 55 to 65% by visual estimate.  There were some 25% lesions in the second diagonal and mid LAD and a 10% stenosis in the mid RCA at that time.  He presented to the [facility] ED on 01/04/2019 with complaints of chest pain.  Troponins, d-dimer, chest x-ray, and ECG have all been normal.  Symptoms did not show any significant improvement with nitroglycerin or morphine.  He denies fevers, chills, and myalgias.  He said symptoms began last week after working out in the yard.  They were initially located in the left rib cage and then began radiating upwards.  He was evaluated by his PCP on 12/31/2018 and it was felt he had chest wall pain.  He tried taking some aspirin.  Yesterday he took 2 nitroglycerin with a mild degree of pain relief.  At the time my evaluation he is free of chest pain altogether.     Past Medical History:  Diagnosis Date  . Anxiety   . 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 Coronary atherosclerosis of native coronary artery    a. 12/09/2013: s/p PCI with 3.0 x 28 Promus DES to mLAD  . Cyst (solitary) of breast 10/03/2017   removed from back  . Depression   .  Elbow injury 07/25/2017   surgery for torn tendon  . Essential hypertension, benign   . Fatty liver disease, nonalcoholic   . Lateral epicondylitis of right elbow   . Mixed hyperlipidemia 2006  . Obesity   . Osgood-Schlatter's disease of right knee   . Type 2 diabetes mellitus ([place])     Past Surgical History:  Procedure Laterality Date  . CARDIAC CATHETERIZATION  2011  . CARDIAC CATHETERIZATION N/A 09/20/2015   Procedure: Left Heart Cath and Coronary Angiography;  Surgeon: [name], MD;  Location: [place] [facility];  Service: Cardiovascular;  Laterality: N/A;  . CHOLECYSTECTOMY  2003  . COLONOSCOPY  06/19/2012   Procedure: COLONOSCOPY;  Surgeon: [name], MD;  [facility];  Service: Endoscopy;  Laterality: N/A;  930  . COLONOSCOPY N/A 10/17/2017   Procedure: COLONOSCOPY;  Surgeon: [name], MD;  Location: [facility];  Service: Endoscopy;  Laterality: N/A;  1225  . KNEE ARTHROPLASTY Right 1999  . LEFT HEART CATH AND CORONARY ANGIOGRAPHY N/A 02/26/2018   Procedure: LEFT HEART CATH AND CORONARY ANGIOGRAPHY;  Surgeon: [name], MD;  Location: [place] [facility];  Service: Cardiovascular;  Laterality: N/A;  . LEFT HEART CATHETERIZATION WITH CORONARY ANGIOGRAM N/A 12/09/2013   Procedure: LEFT HEART CATHETERIZATION WITH CORONARY ANGIOGRAM;  Surgeon: [name], MD;  Location: [facility] CATH LAB;  Service: Cardiovascular;  Laterality: N/A;  .  Liver biopsy    . POLYPECTOMY  10/17/2017   Procedure: POLYPECTOMY;  Surgeon: [name], MD;  Location: [facility];  Service: Endoscopy;;  ascending colon, splenic flexure,sigmoid x2  . TENNIS ELBOW RELEASE/NIRSCHEL PROCEDURE Right 07/25/2017   Procedure: TENNIS ELBOW RELEASE and debriedment;  Surgeon: [name], MD;  Location: [facility];  Service: Orthopedics;  Laterality: Right;  . TONSILLECTOMY  1970's       Inpatient Medications: Scheduled Meds: . aspirin EC  81 mg Oral Daily   . atorvastatin  40 mg Oral q1800  . budesonide (PULMICORT) nebulizer solution  0.5 mg Nebulization BID  . Chlorhexidine Gluconate Cloth  6 each Topical Q0600  . Chlorhexidine Gluconate Cloth  6 each Topical Q0600  . diltiazem  30 mg Oral Q12H  . insulin aspart  0-15 Units Subcutaneous TID WC  . insulin aspart  0-5 Units Subcutaneous QHS  . insulin glargine  90 Units Subcutaneous QHS  . losartan  25 mg Oral Daily  . methocarbamol  500 mg Oral TID  . mupirocin ointment  1 application Nasal BID  . pantoprazole  40 mg Oral BID AC  . ranolazine  500 mg Oral BID  . sertraline  100 mg Oral Daily   Continuous Infusions: . heparin 1,750 Units/hr (01/05/19 0800)   PRN Meds: acetaminophen, acetaminophen, clonazePAM, HYDROcodone-acetaminophen, meclizine, morphine injection, nitroGLYCERIN, ondansetron (ZOFRAN) IV  Allergies:    Allergies  Allergen Reactions  . Metoprolol Rash    Social History:   Social History   Socioeconomic History  . Marital status: Married    Spouse name: Not on file  . Number of children: Not on file  . Years of education: Not on file  . Highest education level: Not on file  Occupational History  . Not on file  Social Needs  . Financial resource strain: Not hard at all  . Food insecurity:    Worry: Never true    Inability: Never true  . Transportation needs:    Medical: No    Non-medical: No  Tobacco Use  . Smoking status: Former Smoker    Packs/day: 0.50    Years: 10.00    Pack years: 5.00    Types: Cigarettes    Start date: 02/02/1973    Last attempt to quit: 09/17/1983    Years since quitting: 35.3  . Smokeless tobacco: Former Systems developer    Types: [place] date: 09/17/1983  Substance and Sexual Activity  . Alcohol use: No    Alcohol/week: 0.0 standard drinks  . Drug use: No  . Sexual activity: Yes  Lifestyle  . Physical activity:    Days per week: Patient refused    Minutes per session: Patient refused  . Stress: To some extent   Relationships  . Social connections:    Talks on phone: Patient refused    Gets together: Patient refused    Attends religious service: Patient refused    Active member of club or organization: Patient refused    Attends meetings of clubs or organizations: Patient refused    Relationship status: Patient refused  . Intimate partner violence:    Fear of current or ex partner: No    Emotionally abused: No    Physically abused: No    Forced sexual activity: No  Other Topics Concern  . 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  Married with 1 child   Right handed    54 th    Very little caffeine    Family History:    Family History  Problem Relation Age of Onset  . Cancer Maternal Aunt   . CVA Maternal Grandmother   . CVA Maternal Grandfather      ROS:  Please see the history of present illness.   All other ROS reviewed and negative.     Physical Exam/Data:   Vitals:   01/05/19 0745 01/05/19 0800 01/05/19 0900 01/05/19 1000  BP:  (!) 128/93 124/71 (!) 133/92  Pulse:  81 77 78  Resp:  20 14 13   Temp:      TempSrc:      SpO2: 97% 91% 93% 97%  Weight:      Height:        Intake/Output Summary (Last 24 hours) at 01/05/2019 1040 Last data filed at 01/05/2019 0800 Gross per 24 hour  Intake 573.84 ml  Output 1700 ml  Net -1126.16 ml   Last 3 Weights 01/04/2019 11/10/2018 10/22/2018  Weight (lbs) 245 lb 248 lb 243 lb  Weight (kg) 111.131 kg 112.492 kg 110.224 kg  Some encounter information is confidential and restricted. Go to Review Flowsheets activity to see all data.     Body mass index is 31.46 kg/m.  General:  Well nourished, well developed, in no acute distress HEENT: normal Lymph: no adenopathy Neck: no JVD Endocrine:  No thryomegaly Cardiac:  normal S1, S2; RRR; no murmur  Lungs:  clear to auscultation bilaterally, no wheezing, rhonchi or rales  Abd: soft, nontender, no  hepatomegaly  Ext: no edema Musculoskeletal:  No deformities, BUE and BLE strength normal and equal Skin: warm and dry  Neuro:  CNs 2-12 intact, no focal abnormalities noted Psych:  Normal affect   EKG:  The EKG was personally reviewed and demonstrates: Sinus rhythm without ischemic abnormalities. Telemetry:  Telemetry was personally reviewed and demonstrates: Sinus rhythm with artifact  Relevant CV Studies:  Cardiac catheterization 02/26/2018:   Colon Flattery 2nd Diag to 2nd Diag lesion is 25% stenosed.  Prior LAD stent is widely patent with minimal restenosis.  Mid LAD-2 lesion is 25% stenosed.  Mid RCA lesion is 10% stenosed.  The left ventricular systolic function is normal.  LV end diastolic pressure is normal.  The left ventricular ejection fraction is 55-65% by visual estimate.  There is no aortic valve stenosis.  JL4 works well from the right wrist.   Continue aggressive secondary prevention.  Investigate noncardiac causes of chest pain.  Echocardiogram 07/12/2018:  - Left ventricle: The cavity size was normal. Wall thickness was   increased in a pattern of moderate LVH. Systolic function was   normal. The estimated ejection fraction was in the range of 60%   to 65%. Wall motion was normal; there were no regional wall   motion abnormalities. The study is not technically sufficient to   allow evaluation of LV diastolic function. - Aortic valve: Valve area (VTI): 4.17 cm^2. Valve area (Vmax):   5.11 cm^2. Valve area (Vmean): 5.16 cm^2. - Atrial septum: No defect or patent foramen ovale was identified.  Laboratory Data:  Chemistry Recent Labs  Lab 01/04/19 1016  NA 138  K 3.7  CL 102  CO2 23  GLUCOSE 298*  BUN 16  CREATININE 1.08  CALCIUM 9.1  GFRNONAA >60  GFRAA >60  ANIONGAP 13    No results for input(s): PROT, ALBUMIN, AST, ALT, ALKPHOS, BILITOT in the  last 168 hours. Hematology Recent Labs  Lab 01/04/19 1016 01/05/19 0235  WBC 5.7 7.5  RBC 5.15  5.08  HGB 15.2 14.8  HCT 43.9 43.7  MCV 85.2 86.0  MCH 29.5 29.1  MCHC 34.6 33.9  RDW 13.5 13.2  PLT 152 151   Cardiac Enzymes Recent Labs  Lab 01/04/19 1016 01/04/19 1137 01/04/19 1503 01/04/19 2024 01/05/19 0235  TROPONINI <0.03 <0.03 <0.03 <0.03 <0.03   No results for input(s): TROPIPOC in the last 168 hours.  BNPNo results for input(s): BNP, PROBNP in the last 168 hours.  DDimer  Recent Labs  Lab 01/04/19 1503  DDIMER <0.27    Radiology/Studies:  Dg Chest 2 View  Result Date: 01/04/2019 CLINICAL DATA:  Ongoing chest pain. EXAM: CHEST - 2 VIEW COMPARISON:  July 12, 2018 FINDINGS: The heart size and mediastinal contours are within normal limits. Both lungs are clear. The visualized skeletal structures are unremarkable. IMPRESSION: No active cardiopulmonary disease. Electronically Signed   By: Dorise Bullion III M.D   On: 01/04/2019 10:51    Assessment and Plan:   1.  Chest pain in the context of coronary artery disease: Symptoms appear atypical for coronary ischemia.  Most recent cardiac catheterization results from June 2019 reviewed above.  Currently on IV heparin along with aspirin, atorvastatin, and Ranexa.  I will stop heparin and increase Ranexa to 1000 mg twice daily.  Given his elevated AXE9M he is certainly at risk for microvascular angina although I am not entirely certain that this is what present symptoms are due to.  He does have a history of anxiety and depression as well.  2.  Hypertension: Blood pressure was initially elevated at 154/103 and is currently 133/92.  3.  Hyperlipidemia: Presently on atorvastatin 40 mg.  LDL 51 on 10/18/2018.  4.  Type 2 diabetes mellitus: A1c elevated at 9% on 10/18/2018.   For questions or updates, please contact [place] Please consult [email or web address] for contact info under     Signed, Kate Sable, MD  01/05/2019 10:40 AM

## 2019-01-05 NOTE — Discharge Instructions (Signed)
Nonspecific Chest Pain, Adult Chest pain can be caused by many different conditions. It can be caused by a condition that is life-threatening and requires treatment right away. It can also be caused by something that is not life-threatening. If you have chest pain, it can be hard to know the difference, so it is important to get help right away to make sure that you do not have a serious condition. Some life-threatening causes of chest pain include:  Heart attack.  A tear in the body's main blood vessel (aortic dissection).  Inflammation around your heart (pericarditis).  A problem in the lungs, such as a blood clot (pulmonary embolism) or a collapsed lung (pneumothorax). Some non life-threatening causes of chest pain include:  Heartburn.  Anxiety or stress.  Damage to the bones, muscles, and cartilage that make up your chest wall.  Pneumonia or bronchitis.  Shingles infection (varicella-zoster virus). Chest pain can feel like:  Pain or discomfort on the surface of your chest or deep in your chest.  Crushing, pressure, aching, or squeezing pain.  Burning or tingling.  Dull or sharp pain that is worse when you move, cough, or take a deep breath.  Pain or discomfort that is also felt in your back, neck, jaw, shoulder, or arm, or pain that spreads to any of these areas. Your chest pain may come and go. It may also be constant. Your health care provider will do lab tests and other studies to find the cause of your pain. Treatment will depend on the cause of your chest pain. Follow these instructions at home: Medicines  Take over-the-counter and prescription medicines only as told by your health care provider.  If you were prescribed an antibiotic, take it as told by your health care provider. Do not stop taking the antibiotic even if you start to feel better. Lifestyle   Rest as directed by your health care provider.  Do not use any products that contain nicotine or tobacco,  such as cigarettes and e-cigarettes. If you need help quitting, ask your health care provider.  Do not drink alcohol.  Make healthy lifestyle choices as recommended. These may include: ? Getting regular exercise. Ask your health care provider to suggest some activities that are safe for you. ? Eating a heart-healthy diet. This includes plenty of fresh fruits and vegetables, whole grains, low-fat (lean) protein, and low-fat dairy products. A dietitian can help you find healthy eating options. ? Maintaining a healthy weight. ? Managing any other health conditions you have, such as high blood pressure (hypertension) or diabetes. ? Reducing stress, such as with yoga or relaxation techniques. General instructions  Pay attention to any changes in your symptoms. Tell your health care provider about them or any new symptoms.  Avoid any activities that cause chest pain.  Keep all follow-up visits as told by your health care provider. This is important. This includes visits for any further testing if your chest pain does not go away. Contact a health care provider if:  Your chest pain does not go away.  You feel depressed.  You have a fever. Get help right away if:  Your chest pain gets worse.  You have a cough that gets worse, or you cough up blood.  You have severe pain in your abdomen.  You faint.  You have sudden, unexplained chest discomfort.  You have sudden, unexplained discomfort in your arms, back, neck, or jaw.  You have shortness of breath at any time.  You suddenly start  to sweat, or your skin gets clammy.  You feel nausea or you vomit.  You suddenly feel lightheaded or dizzy.  You have severe weakness, or unexplained weakness or fatigue.  Your heart begins to beat quickly, or it feels like it is skipping beats. These symptoms may represent a serious problem that is an emergency. Do not wait to see if the symptoms will go away. Get medical help right away. Call your  local emergency services (911 in the [place] Do not drive yourself to the hospital. Summary  Chest pain can be caused by a condition that is serious and requires urgent treatment. It may also be caused by something that is not life-threatening.  If you have chest pain, it is very important to see your health care provider. Your health care provider may do lab tests and other studies to find the cause of your pain.  Follow your health care provider's instructions on taking medicines, making lifestyle changes, and getting emergency treatment if symptoms become worse.  Keep all follow-up visits as told by your health care provider. This includes visits for any further testing if your chest pain does not go away. This information is not intended to replace advice given to you by your health care provider. Make sure you discuss any questions you have with your health care provider. Document Released: 06/13/2005 Document Revised: 03/06/2018 Document Reviewed: 03/06/2018 Elsevier Interactive Patient Education  2019 [employer]   IMPORTANT INFORMATION: PAY CLOSE ATTENTION   PHYSICIAN DISCHARGE INSTRUCTIONS  Follow with Primary care provider  Mikey Kirschner, MD  and other consultants as instructed your Hospitalist Physician  SEEK MEDICAL CARE OR RETURN TO EMERGENCY ROOM IF SYMPTOMS COME BACK, WORSEN OR NEW PROBLEM DEVELOPS.   Please note: You were cared for by a hospitalist during your hospital stay. Every effort will be made to forward records to your primary care provider.  You can request that your primary care provider send for your hospital records if they have not received them.  Once you are discharged, your primary care physician will handle any further medical issues. Please note that NO REFILLS for any discharge medications will be authorized once you are discharged, as it is imperative that you return to your primary care physician (or establish a relationship with a primary care  physician if you do not have one) for your post hospital discharge needs so that they can reassess your need for medications and monitor your lab values.  Please get a complete blood count and chemistry panel checked by your Primary MD at your next visit, and again as instructed by your Primary MD.  Get Medicines reviewed and adjusted: Please take all your medications with you for your next visit with your Primary MD  Laboratory/radiological data: Please request your Primary MD to go over all hospital tests and procedure/radiological results at the follow up, please ask your primary care provider to get all Hospital records sent to his/her office.  In some cases, they will be blood work, cultures and biopsy results pending at the time of your discharge. Please request that your primary care provider follow up on these results.  If you are diabetic, please bring your blood sugar readings with you to your follow up appointment with primary care.    Please call and make your follow up appointments as soon as possible.    Also Note the following: If you experience worsening of your admission symptoms, develop shortness of breath, life threatening emergency, suicidal or homicidal  thoughts you must seek medical attention immediately by calling 911 or calling your MD immediately  if symptoms less severe.  You must read complete instructions/literature along with all the possible adverse reactions/side effects for all the Medicines you take and that have been prescribed to you. Take any new Medicines after you have completely understood and accpet all the possible adverse reactions/side effects.   Do not drive when taking Pain medications or sleeping medications (Benzodiazepines)  Do not take more than prescribed Pain, Sleep and Anxiety Medications. It is not advisable to combine anxiety,sleep and pain medications without talking with your primary care practitioner  Special Instructions: If you have  smoked or chewed Tobacco  in the last 2 yrs please stop smoking, stop any regular Alcohol  and or any Recreational drug use.  Wear Seat belts while driving.

## 2019-01-06 ENCOUNTER — Ambulatory Visit (INDEPENDENT_AMBULATORY_CARE_PROVIDER_SITE_OTHER): Payer: Self-pay | Admitting: Internal Medicine

## 2019-01-07 ENCOUNTER — Ambulatory Visit: Payer: Self-pay | Admitting: Orthopedic Surgery

## 2019-01-08 ENCOUNTER — Other Ambulatory Visit: Payer: Self-pay | Admitting: "Endocrinology

## 2019-01-08 ENCOUNTER — Telehealth: Payer: Self-pay | Admitting: "Endocrinology

## 2019-01-08 NOTE — Telephone Encounter (Signed)
Advise him to increase Toujeo to 100 units qhs, everything else the same.

## 2019-01-08 NOTE — Telephone Encounter (Signed)
Pt brought BG readings by here. He states they have been high. These have been scanned into chart.

## 2019-01-08 NOTE — Telephone Encounter (Signed)
Pt.notified

## 2019-01-13 ENCOUNTER — Other Ambulatory Visit (INDEPENDENT_AMBULATORY_CARE_PROVIDER_SITE_OTHER): Payer: Self-pay | Admitting: *Deleted

## 2019-01-13 ENCOUNTER — Ambulatory Visit (INDEPENDENT_AMBULATORY_CARE_PROVIDER_SITE_OTHER): Payer: PRIVATE HEALTH INSURANCE | Admitting: Internal Medicine

## 2019-01-13 ENCOUNTER — Other Ambulatory Visit: Payer: Self-pay

## 2019-01-13 ENCOUNTER — Encounter (INDEPENDENT_AMBULATORY_CARE_PROVIDER_SITE_OTHER): Payer: Self-pay | Admitting: Internal Medicine

## 2019-01-13 VITALS — BP 136/90 | HR 80 | Temp 98.5°F | Resp 18 | Ht 74.0 in | Wt 251.7 lb

## 2019-01-13 DIAGNOSIS — K219 Gastro-esophageal reflux disease without esophagitis: Secondary | ICD-10-CM | POA: Diagnosis not present

## 2019-01-13 DIAGNOSIS — R1013 Epigastric pain: Secondary | ICD-10-CM | POA: Insufficient documentation

## 2019-01-13 DIAGNOSIS — G8929 Other chronic pain: Secondary | ICD-10-CM | POA: Insufficient documentation

## 2019-01-13 NOTE — H&P (View-Only) (Signed)
Reason for consultation  Upper abdominal pain.  History of present illness:  Patient is 57-year-old Caucasian male who has been referred through courtesy of Dr. Steve looking for GI evaluation.  He has a history of fatty liver disease but has not been seen in the office in several years.  But he did undergo colonoscopy in January 2019.  Patient presents with 1 month history of left upper quadrant pain which was intermittent and mild to begin with.  At times he felt as if he had pulled muscle.  Other times he felt jabbing pain.  Over the course of next few weeks the pain became more pronounced.  This pain radiated to epigastric region.  He did not experience any associated symptoms such as hematuria diarrhea nausea or vomiting.  He has been on PPI therapy chronically for GERD and his heartburn has been well controlled.  He had this telemetry visit with Dr. Steve Luking on 12/31/2018 and was felt he had wall pain.  4 days later on 01/04/2019 pain became intense and radiated not only to his epigastric region but also to substernal area and left side of the neck.  He took 2 nitroglycerin with some relief.  Patient became concerned that he he might be having angina or coronary event.  He therefore went to emergency room at [facility]. He was evaluated by Dr. Scott Sakowski.  Chest film was unremarkable.  EKG did not reveal any acute abnormalities.  Troponin level was normal.  D dimer level was also normal.  CBC was also within normal limits. Patient was admitted to a monitored bed.  Troponin levels were checked multiple times and always less than 0.03.  Similarly there were no EKG abnormalities that are involved.  He was evaluated by Dr. Koneswaran of cardiology service.  Chest pain was felt to be atypical.  Ranexa was doubled and he was discharged. Patient reports feeling better over the last 2 days.  3 nights ago he took a pain pill medication and since then pain is much less.  Pain is now primarily in epigastric  region radiating into lower sternal area as well as to left upper quadrant.  He denies dysphagia nausea vomiting melena or rectal bleeding.  He has noted postprandial diarrhea over the last week or so.  He also denies dysuria or hematuria.  He states he has been using pillow at night when he lies on his right side and this seemed to help with the pain.  He remains with good appetite.  He has not lost any weight since his pain started. Patient states he was evaluated for lower back pain by Dr. Harrison about 2 months ago.  He was given 2 medications but he does not remember the name.  He also had physical therapy and pain has resolved.  Review of Dr. Harrison's records reveal that patient was given meloxicam 7.5 mg daily that he took for 6 weeks and he was also given Robaxin.  Patient is on low-dose aspirin but does not take other OTC NSAIDs. Patient states his diabetic control has not been adequate recently.  He is under care of Dr. Nida.  Insulin dose was increased last week but he feels it is not helping bring his glucose levels down. Review of the systems is negative for fever chills night sweats or shortness of breath. There is no history of peptic ulcer disease.  He does not recall ever having EGD given that he has had GERD symptoms for several years.   Current   Medications: Outpatient Encounter Medications as of 01/13/2019  Medication Sig  . albuterol (PROVENTIL HFA;VENTOLIN HFA) 108 (90 Base) MCG/ACT inhaler Inhale 2 puffs into the lungs every 6 (six) hours as needed for wheezing or shortness of breath.  . aspirin (ASPIR-LOW) 81 MG EC tablet Take 1 tablet (81 mg total) by mouth daily.  . atorvastatin (LIPITOR) 40 MG tablet TAKE ONE (1) TABLET BY MOUTH EVERY DAY  . clonazePAM (KLONOPIN) 0.5 MG tablet Take 1 tablet (0.5 mg total) by mouth 3 (three) times daily as needed for anxiety.  . diltiazem (CARDIZEM) 30 MG tablet TAKE (1) TABLET BY MOUTH TWICE DAILY.  . fluticasone (FLOVENT HFA) 44 MCG/ACT  inhaler Inhale 2 puffs into the lungs 2 (two) times daily.  . glucose blood test strip 1 each by Other route 4 (four) times daily. Use as instructed qid. E11.65. One touch Ultra  . HYDROcodone-acetaminophen (NORCO) 5-325 MG tablet Take 1 tablet by mouth every 6 (six) hours as needed for moderate pain.  . Insulin Glargine, 2 Unit Dial, (TOUJEO MAX SOLOSTAR) 300 UNIT/ML SOPN Inject 90 Units into the skin at bedtime. (Patient taking differently: Inject 100 Units into the skin at bedtime. )  . Lancets MISC 1 each by Does not apply route 4 (four) times daily.  . losartan (COZAAR) 25 MG tablet TAKE (1) TABLET BY MOUTH ONCE DAILY.  . meclizine (ANTIVERT) 25 MG tablet Take 1 tablet (25 mg total) by mouth 3 (three) times daily as needed for dizziness.  . metFORMIN (GLUCOPHAGE) 500 MG tablet TAKE 2 TABLETS IN THE MORNING, 1 TABLET AT NOON, AND 2 TABLETS IN THEEVENING.  . nitroGLYCERIN (NITROSTAT) 0.4 MG SL tablet Place 1 tablet (0.4 mg total) under the tongue every 5 (five) minutes as needed for chest pain.  . pantoprazole (PROTONIX) 40 MG tablet Take one daily  . ranolazine (RANEXA) 1000 MG SR tablet Take 1 tablet (1,000 mg total) by mouth 2 (two) times daily for 30 days.  . sertraline (ZOLOFT) 100 MG tablet Take 1 tablet (100 mg total) by mouth daily.  . [DISCONTINUED] gabapentin (NEURONTIN) 300 MG capsule TAKE 1 CAPSULE BY MOUTH FOUR TIMES DAILY.  . [DISCONTINUED] methocarbamol (ROBAXIN) 500 MG tablet Take 1 tablet (500 mg total) by mouth 3 (three) times daily. (Patient not taking: Reported on 01/13/2019)   No facility-administered encounter medications on file as of 01/13/2019.    Past medical history Past Medical History:  Diagnosis Date  . Anxiety   . 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 Coronary atherosclerosis of native coronary artery    a. 12/09/2013: s/p PCI with 3.0 x 28 Promus DES to mLAD  . Cyst (solitary) of breast 10/03/2017   removed from back  . Depression   . Elbow  injury 07/25/2017   surgery for torn tendon  . Essential hypertension, benign   . Fatty liver disease, nonalcoholic   . Lateral epicondylitis of right elbow   . Mixed hyperlipidemia 2006  . Obesity   . Osgood-Schlatter's disease of right knee   . Type 2 diabetes mellitus (HCC)        History of colonic adenomas.  Last colonoscopy was in January      2019.      Chronic GERD.  Past Surgical History:  Procedure Laterality Date  . CARDIAC CATHETERIZATION  2011  . CARDIAC CATHETERIZATION N/A 09/20/2015   Procedure: Left Heart Cath and Coronary Angiography;  Surgeon: Christopher D McAlhany, MD;  [facility]: [facility];    Service: Cardiovascular;  Laterality: N/A;  . CHOLECYSTECTOMY  2003  . COLONOSCOPY  06/19/2012   Procedure: COLONOSCOPY;  Surgeon: Najeeb U Rehman, MD;  [facility];  Service: Endoscopy;  Laterality: N/A;  930  . COLONOSCOPY N/A 10/17/2017   Procedure: COLONOSCOPY;  Surgeon: Rehman, Najeeb U, MD;  Location: [facility];  Service: Endoscopy;  Laterality: N/A;  1225  . KNEE ARTHROPLASTY Right 1999  . LEFT HEART CATH AND CORONARY ANGIOGRAPHY N/A 02/26/2018   Procedure: LEFT HEART CATH AND CORONARY ANGIOGRAPHY;  Surgeon: Varanasi, Jayadeep S, MD;  Location: [facility];  Service: Cardiovascular;  Laterality: N/A;  . LEFT HEART CATHETERIZATION WITH CORONARY ANGIOGRAM N/A 12/09/2013   Procedure: LEFT HEART CATHETERIZATION WITH CORONARY ANGIOGRAM;  Surgeon: Jayadeep S Varanasi, MD;  Location: [facility];  Service: Cardiovascular;  Laterality: N/A;  . Liver biopsy    . POLYPECTOMY  10/17/2017   Procedure: POLYPECTOMY;  Surgeon: Rehman, Najeeb U, MD;  [facility]: [facility];  Service: Endoscopy;;  ascending colon, splenic flexure,sigmoid x2  . TENNIS ELBOW RELEASE/NIRSCHEL PROCEDURE Right 07/25/2017   Procedure: TENNIS ELBOW RELEASE and debriedment;  Surgeon: Harrison, Stanley E, MD;  Location: [facility];  Service: Orthopedics;  Laterality: Right;  . TONSILLECTOMY   1970's   Allergies  Allergen Reactions  . Metoprolol Rash   Family history  Both parents are in good health.  Father is 76 years old and mother is 75.  He is the only child.  Social history  He is married.  He works with [employer] for 22 years.  His last job was at [employer] where he sustained injury to his right elbow July 2018 and now is on disability.  He smokes cigarettes for about 7 years about half a pack a day and quit when he was 58 years old.  He does not drink alcohol.   Physical examination  Blood pressure 136/90, pulse 80, temperature 98.5 F (36.9 C), temperature source Oral, resp. rate 18, height 6' 2" (1.88 m), weight 251 lb 11.2 oz (114.2 kg). Well-developed well-nourished Caucasian male in NAD. Conjunctiva is pink. Sclera is nonicteric Oropharyngeal mucosa is normal. No neck masses or thyromegaly noted. Cardiac exam with regular rhythm normal S1 and S2. No murmur or gallop noted. Lungs are clear to auscultation. Abdomen is full.  Bowel sounds are normal.  No bruit noted.  On palpation abdomen is soft.  He has mild tenderness below the left costal margin and mild to moderate tenderness mid epigastric region.  No hepatosplenomegaly. No LE edema or clubbing noted.  Labs/studies Results:  CBC Latest Ref Rng & Units 01/05/2019 01/04/2019 07/12/2018  WBC 4.0 - 10.5 K/uL 7.5 5.7 7.6  Hemoglobin 13.0 - 17.0 g/dL 14.8 15.2 15.3  Hematocrit 39.0 - 52.0 % 43.7 43.9 46.2  Platelets 150 - 400 K/uL 151 152 174    CMP Latest Ref Rng & Units 01/04/2019 10/18/2018 07/12/2018  Glucose 70 - 99 mg/dL 298(H) 174(H) 225(H)  BUN 6 - 20 mg/dL 16 13 20  Creatinine 0.61 - 1.24 mg/dL 1.08 0.94 0.98  Sodium 135 - 145 mmol/L 138 140 136  Potassium 3.5 - 5.1 mmol/L 3.7 4.2 4.0  Chloride 98 - 111 mmol/L 102 100 102  CO2 22 - 32 mmol/L 23 22 20(L)  Calcium 8.9 - 10.3 mg/dL 9.1 9.4 9.7  Total Protein 6.0 - 8.5 g/dL - 6.7 7.2  Total Bilirubin 0.0 - 1.2 mg/dL - 0.8 1.0  Alkaline Phos  39 - 117   IU/L - 46 41  AST 0 - 40 IU/L - 21 29  ALT 0 - 44 IU/L - 34 41    Hepatic Function Latest Ref Rng & Units 10/18/2018 07/12/2018 05/13/2018  Total Protein 6.0 - 8.5 g/dL 6.7 7.2 7.2  Albumin 3.8 - 4.9 g/dL 4.5 4.3 4.8  AST 0 - 40 IU/L 21 29 20  ALT 0 - 44 IU/L 34 41 32  Alk Phosphatase 39 - 117 IU/L 46 41 44  Total Bilirubin 0.0 - 1.2 mg/dL 0.8 1.0 0.7  Bilirubin, Direct 0.00 - 0.40 mg/dL - - 0.23     Assessment:  #1.  Abdominal pain.  Pain started in left upper quadrant and now has migrated to epigastric region and also radiates into lower chest.  Recent hospitalization was negative for myocardial injury.  Suspect peptic ulcer disease given that he was on meloxicam daily for 6 weeks in addition to low-dose aspirin for prophylaxis.  Therefore he would benefit from diagnostic EGD.  #2.  Chronic GERD.  He has been on PPI for years and it does not appear that he has undergone EGD.  Need to rule out Barrett's esophagus.  #3.  History of cirrhosis secondary to fatty liver.  Transaminases were normal in February 2020.  He does not have thrombocytopenia.  Therefore his cirrhosis appears to be well compensated but he is due for screening ultrasound.  EGD would also allow us to screen for esophageal varices  Recommendations:  Patient can continue low-dose aspirin but should not take OTC NSAIDs. He will continue pantoprazole at current dose which is 40 mg daily before breakfast. We will schedule patient for diagnostic esophagogastroduodenoscopy as soon as possible. We will schedule ultrasound for HCC screening. We will try to retrieve liver biopsy records. Office visit in 8 weeks.         

## 2019-01-13 NOTE — Patient Instructions (Signed)
Esophagogastroduodenoscopy to be scheduled in near future. These do not take OTC medications such as Aleve Advil BC powder Goody powder etc.

## 2019-01-13 NOTE — Progress Notes (Signed)
Reason for consultation  Upper abdominal pain.  History of present illness:  Patient is 58 year old Caucasian male who has been referred through courtesy of Dr. Richardson Landry looking for GI evaluation.  He has a history of fatty liver disease but has not been seen in the office in several years.  But he did undergo colonoscopy in January 2019.  Patient presents with 1 month history of left upper quadrant pain which was intermittent and mild to begin with.  At times he felt as if he had pulled muscle.  Other times he felt jabbing pain.  Over the course of next few weeks the pain became more pronounced.  This pain radiated to epigastric region.  He did not experience any associated symptoms such as hematuria diarrhea nausea or vomiting.  He has been on PPI therapy chronically for GERD and his heartburn has been well controlled.  He had this telemetry visit with Dr. Mickie Hillier on 12/31/2018 and was felt he had wall pain.  4 days later on 01/04/2019 pain became intense and radiated not only to his epigastric region but also to substernal area and left side of the neck.  He took 2 nitroglycerin with some relief.  Patient became concerned that he he might be having angina or coronary event.  He therefore went to emergency room at [facility]. He was evaluated by Dr. Shea Stakes.  Chest film was unremarkable.  EKG did not reveal any acute abnormalities.  Troponin level was normal.  D dimer level was also normal.  CBC was also within normal limits. Patient was admitted to a monitored bed.  Troponin levels were checked multiple times and always less than 0.03.  Similarly there were no EKG abnormalities that are involved.  He was evaluated by Dr. Bronson Ing of cardiology service.  Chest pain was felt to be atypical.  Ranexa was doubled and he was discharged. Patient reports feeling better over the last 2 days.  3 nights ago he took a pain pill medication and since then pain is much less.  Pain is now primarily in epigastric  region radiating into lower sternal area as well as to left upper quadrant.  He denies dysphagia nausea vomiting melena or rectal bleeding.  He has noted postprandial diarrhea over the last week or so.  He also denies dysuria or hematuria.  He states he has been using pillow at night when he lies on his right side and this seemed to help with the pain.  He remains with good appetite.  He has not lost any weight since his pain started. Patient states he was evaluated for lower back pain by Dr. [name] about 2 months ago.  He was given 2 medications but he does not remember the name.  He also had physical therapy and pain has resolved.  Review of Dr. Ruthe Mannan records reveal that patient was given meloxicam 7.5 mg daily that he took for 6 weeks and he was also given Robaxin.  Patient is on low-dose aspirin but does not take other OTC NSAIDs. Patient states his diabetic control has not been adequate recently.  He is under care of Dr. Dorris Fetch.  Insulin dose was increased last week but he feels it is not helping bring his glucose levels down. Review of the systems is negative for fever chills night sweats or shortness of breath. There is no history of peptic ulcer disease.  He does not recall ever having EGD given that he has had GERD symptoms for several years.   Current  Medications: Outpatient Encounter Medications as of 01/13/2019  Medication Sig  . albuterol (PROVENTIL HFA;VENTOLIN HFA) 108 (90 Base) MCG/ACT inhaler Inhale 2 puffs into the lungs every 6 (six) hours as needed for wheezing or shortness of breath.  Marland Kitchen aspirin (ASPIR-LOW) 81 MG EC tablet Take 1 tablet (81 mg total) by mouth daily.  Marland Kitchen atorvastatin (LIPITOR) 40 MG tablet TAKE ONE (1) TABLET BY MOUTH EVERY DAY  . clonazePAM (KLONOPIN) 0.5 MG tablet Take 1 tablet (0.5 mg total) by mouth 3 (three) times daily as needed for anxiety.  Marland Kitchen diltiazem (CARDIZEM) 30 MG tablet TAKE (1) TABLET BY MOUTH TWICE DAILY.  . fluticasone (FLOVENT HFA) 44 MCG/ACT  inhaler Inhale 2 puffs into the lungs 2 (two) times daily.  Marland Kitchen glucose blood test strip 1 each by Other route 4 (four) times daily. Use as instructed qid. E11.65. One touch Ultra  . HYDROcodone-acetaminophen (NORCO) 5-325 MG tablet Take 1 tablet by mouth every 6 (six) hours as needed for moderate pain.  . Insulin Glargine, 2 Unit Dial, (TOUJEO MAX SOLOSTAR) 300 UNIT/ML SOPN Inject 90 Units into the skin at bedtime. (Patient taking differently: Inject 100 Units into the skin at bedtime. )  . Lancets MISC 1 each by Does not apply route 4 (four) times daily.  Marland Kitchen losartan (COZAAR) 25 MG tablet TAKE (1) TABLET BY MOUTH ONCE DAILY.  . meclizine (ANTIVERT) 25 MG tablet Take 1 tablet (25 mg total) by mouth 3 (three) times daily as needed for dizziness.  . metFORMIN (GLUCOPHAGE) 500 MG tablet TAKE 2 TABLETS IN THE MORNING, 1 TABLET AT NOON, AND 2 TABLETS IN THEEVENING.  . nitroGLYCERIN (NITROSTAT) 0.4 MG SL tablet Place 1 tablet (0.4 mg total) under the tongue every 5 (five) minutes as needed for chest pain.  . pantoprazole (PROTONIX) 40 MG tablet Take one daily  . ranolazine (RANEXA) 1000 MG SR tablet Take 1 tablet (1,000 mg total) by mouth 2 (two) times daily for 30 days.  Marland Kitchen sertraline (ZOLOFT) 100 MG tablet Take 1 tablet (100 mg total) by mouth daily.  . [DISCONTINUED] gabapentin (NEURONTIN) 300 MG capsule TAKE 1 CAPSULE BY MOUTH FOUR TIMES DAILY.  . [DISCONTINUED] methocarbamol (ROBAXIN) 500 MG tablet Take 1 tablet (500 mg total) by mouth 3 (three) times daily. (Patient not taking: Reported on 01/13/2019)   No facility-administered encounter medications on file as of 01/13/2019.    Past medical history Past Medical History:  Diagnosis Date  . Anxiety   . 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 Coronary atherosclerosis of native coronary artery    a. 12/09/2013: s/p PCI with 3.0 x 28 Promus DES to mLAD  . Cyst (solitary) of breast 10/03/2017   removed from back  . Depression   . Elbow  injury 07/25/2017   surgery for torn tendon  . Essential hypertension, benign   . Fatty liver disease, nonalcoholic   . Lateral epicondylitis of right elbow   . Mixed hyperlipidemia 2006  . Obesity   . Osgood-Schlatter's disease of right knee   . Type 2 diabetes mellitus (HCC)        History of colonic adenomas.  Last colonoscopy was in January      2019.      Chronic GERD.  Past Surgical History:  Procedure Laterality Date  . CARDIAC CATHETERIZATION  2011  . CARDIAC CATHETERIZATION N/A 09/20/2015   Procedure: Left Heart Cath and Coronary Angiography;  Surgeon: [name], MD;  Location: [place] [facility];  Service: Cardiovascular;  Laterality: N/A;  . CHOLECYSTECTOMY  2003  . COLONOSCOPY  06/19/2012   Procedure: COLONOSCOPY;  Surgeon: [name], MD;  Location: [facility];  Service: Endoscopy;  Laterality: N/A;  930  . COLONOSCOPY N/A 10/17/2017   Procedure: COLONOSCOPY;  Surgeon: [name], MD;  Location: [facility];  Service: Endoscopy;  Laterality: N/A;  1225  . KNEE ARTHROPLASTY Right 1999  . LEFT HEART CATH AND CORONARY ANGIOGRAPHY N/A 02/26/2018   Procedure: LEFT HEART CATH AND CORONARY ANGIOGRAPHY;  Surgeon: [name], MD;  Location: [place] CV LAB;  Service: Cardiovascular;  Laterality: N/A;  . LEFT HEART CATHETERIZATION WITH CORONARY ANGIOGRAM N/A 12/09/2013   Procedure: LEFT HEART CATHETERIZATION WITH CORONARY ANGIOGRAM;  Surgeon: [name], MD;  Location: [facility] CATH LAB;  Service: Cardiovascular;  Laterality: N/A;  . Liver biopsy    . POLYPECTOMY  10/17/2017   Procedure: POLYPECTOMY;  Surgeon: [name], MD;  Location: [facility];  Service: Endoscopy;;  ascending colon, splenic flexure,sigmoid x2  . TENNIS ELBOW RELEASE/NIRSCHEL PROCEDURE Right 07/25/2017   Procedure: TENNIS ELBOW RELEASE and debriedment;  Surgeon: [name], MD;  Location: [facility];  Service: Orthopedics;  Laterality: Right;  . TONSILLECTOMY   1970's   Allergies  Allergen Reactions  . Metoprolol Rash   Family history  Both parents are in good health.  Father is 15 years old and mother is 9.  He is the only child.  Social history  He is married.  He works with [employer] for 22 years.  His last job was at [employer] where he sustained injury to his right elbow July 2018 and now is on disability.  He smokes cigarettes for about 7 years about half a pack a day and quit when he was 58 years old.  He does not drink alcohol.   Physical examination  Blood pressure 136/90, pulse 80, temperature 98.5 F (36.9 C), temperature source Oral, resp. rate 18, height _0  (1.88 m), weight 251 lb 11.2 oz (114.2 kg). Well-developed well-nourished Caucasian male in NAD. Conjunctiva is pink. Sclera is nonicteric Oropharyngeal mucosa is normal. No neck masses or thyromegaly noted. Cardiac exam with regular rhythm normal S1 and S2. No murmur or gallop noted. Lungs are clear to auscultation. Abdomen is full.  Bowel sounds are normal.  No bruit noted.  On palpation abdomen is soft.  He has mild tenderness below the left costal margin and mild to moderate tenderness mid epigastric region.  No hepatosplenomegaly. No LE edema or clubbing noted.  Labs/studies Results:  CBC Latest Ref Rng & Units 01/05/2019 01/04/2019 07/12/2018  WBC 4.0 - 10.5 K/uL 7.5 5.7 7.6  Hemoglobin 13.0 - 17.0 g/dL 14.8 15.2 15.3  Hematocrit 39.0 - 52.0 % 43.7 43.9 46.2  Platelets 150 - 400 K/uL 151 152 174    CMP Latest Ref Rng & Units 01/04/2019 10/18/2018 07/12/2018  Glucose 70 - 99 mg/dL 298(H) 174(H) 225(H)  BUN 6 - 20 mg/dL _1 Creatinine 0.61 - 1.24 mg/dL 1.08 0.94 0.98  Sodium 135 - 145 mmol/L 138 140 136  Potassium 3.5 - 5.1 mmol/L 3.7 4.2 4.0  Chloride 98 - 111 mmol/L 102 100 102  CO2 22 - 32 mmol/L 23 22 20(L)  Calcium 8.9 - 10.3 mg/dL 9.1 9.4 9.7  Total Protein 6.0 - 8.5 g/dL - 6.7 7.2  Total Bilirubin 0.0 - 1.2 mg/dL - 0.8 1.0  Alkaline Phos  39 - 117  IU/L - 46 41  AST 0 - 40 IU/L - 21 29  ALT 0 - 44 IU/L - 34 41    Hepatic Function Latest Ref Rng & Units 10/18/2018 07/12/2018 05/13/2018  Total Protein 6.0 - 8.5 g/dL 6.7 7.2 7.2  Albumin 3.8 - 4.9 g/dL 4.5 4.3 4.8  AST 0 - 40 IU/L _0 ALT 0 - 44 IU/L 34 41 32  Alk Phosphatase 39 - 117 IU/L 46 41 44  Total Bilirubin 0.0 - 1.2 mg/dL 0.8 1.0 0.7  Bilirubin, Direct 0.00 - 0.40 mg/dL - - 0.23     Assessment:  #1.  Abdominal pain.  Pain started in left upper quadrant and now has migrated to epigastric region and also radiates into lower chest.  Recent hospitalization was negative for myocardial injury.  Suspect peptic ulcer disease given that he was on meloxicam daily for 6 weeks in addition to low-dose aspirin for prophylaxis.  Therefore he would benefit from diagnostic EGD.  #2.  Chronic GERD.  He has been on PPI for years and it does not appear that he has undergone EGD.  Need to rule out Barrett's esophagus.  #3.  History of cirrhosis secondary to fatty liver.  Transaminases were normal in February 2020.  He does not have thrombocytopenia.  Therefore his cirrhosis appears to be well compensated but he is due for screening ultrasound.  EGD would also allow [place] to screen for esophageal varices  Recommendations:  Patient can continue low-dose aspirin but should not take OTC NSAIDs. He will continue pantoprazole at current dose which is 40 mg daily before breakfast. We will schedule patient for diagnostic esophagogastroduodenoscopy as soon as possible. We will schedule ultrasound for [place] screening. We will try to retrieve liver biopsy records. Office visit in 8 weeks.

## 2019-01-14 ENCOUNTER — Other Ambulatory Visit: Payer: Self-pay | Admitting: "Endocrinology

## 2019-01-15 ENCOUNTER — Other Ambulatory Visit: Payer: Self-pay

## 2019-01-15 ENCOUNTER — Encounter (HOSPITAL_COMMUNITY)
Admission: RE | Admit: 2019-01-15 | Discharge: 2019-01-15 | Disposition: A | Discharge status: Home / Self Care | Payer: PRIVATE HEALTH INSURANCE | Source: Ambulatory Visit | Attending: Internal Medicine | Admitting: Internal Medicine

## 2019-01-15 ENCOUNTER — Encounter (HOSPITAL_COMMUNITY): Payer: Self-pay

## 2019-01-15 LAB — COMPREHENSIVE METABOLIC PANEL
ALT: 48 IU/L — ABNORMAL HIGH (ref 0–44)
AST: 24 IU/L (ref 0–40)
Albumin/Globulin Ratio: 1.8 (ref 1.2–2.2)
Albumin: 4.4 g/dL (ref 3.8–4.9)
Alkaline Phosphatase: 46 IU/L (ref 39–117)
BUN/Creatinine Ratio: 15 (ref 9–20)
BUN: 13 mg/dL (ref 6–24)
Bilirubin Total: 0.6 mg/dL (ref 0.0–1.2)
CO2: 23 mmol/L (ref 20–29)
Calcium: 9.1 mg/dL (ref 8.7–10.2)
Chloride: 99 mmol/L (ref 96–106)
Creatinine, Ser: 0.85 mg/dL (ref 0.76–1.27)
GFR calc Af Amer: 113 mL/min/{1.73_m2} (ref >59)
GFR calc non Af Amer: 97 mL/min/{1.73_m2} (ref >59)
Globulin, Total: 2.4 g/dL (ref 1.5–4.5)
Glucose: 196 mg/dL — ABNORMAL HIGH (ref 65–99)
Potassium: 4 mmol/L (ref 3.5–5.2)
Sodium: 139 mmol/L (ref 134–144)
Total Protein: 6.8 g/dL (ref 6.0–8.5)

## 2019-01-15 LAB — HGB A1C W/O EAG: Hgb A1c MFr Bld: 8.9 % — ABNORMAL HIGH (ref 4.8–5.6)

## 2019-01-15 LAB — SPECIMEN STATUS REPORT

## 2019-01-16 ENCOUNTER — Other Ambulatory Visit: Payer: Self-pay | Admitting: Family Medicine

## 2019-01-16 NOTE — Telephone Encounter (Signed)
Six mo worth 

## 2019-01-16 NOTE — Telephone Encounter (Signed)
Left message to return call 

## 2019-01-16 NOTE — Telephone Encounter (Signed)
Pt returned call. Gabapentin not on current med list. Last filled at [facility] on December 16, 2018. Pt did go to [place] Tuesday and this med was accidentally taken off list. Pt is taking two twice a day (two in morning and two in afternoon). Please advise. Thank you

## 2019-01-19 ENCOUNTER — Encounter: Payer: Self-pay | Admitting: "Endocrinology

## 2019-01-19 ENCOUNTER — Ambulatory Visit (INDEPENDENT_AMBULATORY_CARE_PROVIDER_SITE_OTHER): Payer: PRIVATE HEALTH INSURANCE | Admitting: "Endocrinology

## 2019-01-19 ENCOUNTER — Other Ambulatory Visit: Payer: Self-pay

## 2019-01-19 VITALS — BP 123/86 | HR 85 | Temp 98.4°F | Ht 74.0 in | Wt 255.0 lb

## 2019-01-19 DIAGNOSIS — E1159 Type 2 diabetes mellitus with other circulatory complications: Secondary | ICD-10-CM

## 2019-01-19 DIAGNOSIS — I10 Essential (primary) hypertension: Secondary | ICD-10-CM | POA: Diagnosis not present

## 2019-01-19 DIAGNOSIS — E782 Mixed hyperlipidemia: Secondary | ICD-10-CM

## 2019-01-19 MED ORDER — GLIPIZIDE ER 5 MG PO TB24: 5.0000 mg | ORAL_TABLET | Freq: Every day | ORAL | 3 refills | Status: DC

## 2019-01-19 MED ORDER — INSULIN GLARGINE (2 UNIT DIAL) 300 UNIT/ML ~~LOC~~ SOPN: 100.0000 [IU] | PEN_INJECTOR | Freq: Every day | SUBCUTANEOUS | 2 refills | Status: DC

## 2019-01-19 NOTE — Patient Instructions (Signed)

## 2019-01-19 NOTE — Progress Notes (Signed)
Endocrinology follow-up Note       01/19/2019, 9:35 AM   Subjective:    Patient ID: Jared Griffin, male    DOB: 1961-05-15.  Jared Griffin is being seen in follow-up  for management of currently uncontrolled symptomatic type 2 diabetes, hyperlipidemia, hypertension. PMD:  Mikey Kirschner, MD.   Past Medical History:  Diagnosis Date  . Anxiety   . 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 Coronary atherosclerosis of native coronary artery    a. 12/09/2013: s/p PCI with 3.0 x 28 Promus DES to mLAD  . Cyst (solitary) of breast 10/03/2017   removed from back  . Depression   . Elbow injury 07/25/2017   surgery for torn tendon  . Essential hypertension, benign   . Fatty liver disease, nonalcoholic   . Lateral epicondylitis of right elbow   . Mixed hyperlipidemia 2006  . Obesity   . Osgood-Schlatter's disease of right knee   . Type 2 diabetes mellitus ([place])    Past Surgical History:  Procedure Laterality Date  . CARDIAC CATHETERIZATION  2011  . CARDIAC CATHETERIZATION N/A 09/20/2015   Procedure: Left Heart Cath and Coronary Angiography;  Surgeon: [name], MD;  Location: [facility];  Service: Cardiovascular;  Laterality: N/A;  . CHOLECYSTECTOMY  2003  . COLONOSCOPY  06/19/2012   Procedure: COLONOSCOPY;  Surgeon: [name], MD;  [facility]: [facility];  Service: Endoscopy;  Laterality: N/A;  930  . COLONOSCOPY N/A 10/17/2017   Procedure: COLONOSCOPY;  Surgeon: [name], MD;  [facility]: [facility];  Service: Endoscopy;  Laterality: N/A;  1225  . KNEE ARTHROPLASTY Right 1999  . LEFT HEART CATH AND CORONARY ANGIOGRAPHY N/A 02/26/2018   Procedure: LEFT HEART CATH AND CORONARY ANGIOGRAPHY;  Surgeon: [name], MD;  Location: [facility];  Service: Cardiovascular;  Laterality: N/A;  . LEFT HEART CATHETERIZATION WITH CORONARY  ANGIOGRAM N/A 12/09/2013   Procedure: LEFT HEART CATHETERIZATION WITH CORONARY ANGIOGRAM;  Surgeon: [name], MD;  Location: [facility];  Service: Cardiovascular;  Laterality: N/A;  . Liver biopsy    . POLYPECTOMY  10/17/2017   Procedure: POLYPECTOMY;  Surgeon: [name], MD;  [facility]: [facility];  Service: Endoscopy;;  ascending colon, splenic flexure,sigmoid x2  . TENNIS ELBOW RELEASE/NIRSCHEL PROCEDURE Right 07/25/2017   Procedure: TENNIS ELBOW RELEASE and debriedment;  Surgeon: [name], MD;  Location: [facility];  Service: Orthopedics;  Laterality: Right;  . TONSILLECTOMY  1970's   Social History   Socioeconomic History  . Marital status: Married    Spouse name: Not on file  . Number of children: Not on file  . Years of education: Not on file  . Highest education level: Not on file  Occupational History  . Not on file  Social Needs  . Financial resource strain: Not hard at all  . Food insecurity:    Worry: Never true    Inability: Never true  . Transportation needs:    Medical: No    Non-medical: No  Tobacco Use  . Smoking status: Former  Smoker    Packs/day: 0.50    Years: 10.00    Pack years: 5.00    Types: Cigarettes    Start date: 02/02/1973    Last attempt to quit: 09/17/1983    Years since quitting: 35.3  . Smokeless tobacco: Former Systems developer    Types: [place] date: 09/17/1983  Substance and Sexual Activity  . Alcohol use: No    Alcohol/week: 0.0 standard drinks  . Drug use: No  . Sexual activity: Yes  Lifestyle  . Physical activity:    Days per week: Patient refused    Minutes per session: Patient refused  . Stress: To some extent  Relationships  . Social connections:    Talks on phone: Patient refused    Gets together: Patient refused    Attends religious service: Patient refused    Active member of club or organization: Patient refused    Attends meetings of clubs or organizations: Patient refused    Relationship status:  Patient refused  Other Topics Concern  . 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    Married with 1 child   Right handed    12 th    Very little caffeine   Outpatient Encounter Medications as of 01/19/2019  Medication Sig  . albuterol (PROVENTIL HFA;VENTOLIN HFA) 108 (90 Base) MCG/ACT inhaler Inhale 2 puffs into the lungs every 6 (six) hours as needed for wheezing or shortness of breath.  Marland Kitchen aspirin (ASPIR-LOW) 81 MG EC tablet Take 1 tablet (81 mg total) by mouth daily.  Marland Kitchen atorvastatin (LIPITOR) 40 MG tablet TAKE ONE (1) TABLET BY MOUTH EVERY DAY  . clonazePAM (KLONOPIN) 0.5 MG tablet Take 1 tablet (0.5 mg total) by mouth 3 (three) times daily as needed for anxiety.  Marland Kitchen diltiazem (CARDIZEM) 30 MG tablet TAKE (1) TABLET BY MOUTH TWICE DAILY.  . fluticasone (FLOVENT HFA) 44 MCG/ACT inhaler Inhale 2 puffs into the lungs 2 (two) times daily.  Marland Kitchen gabapentin (NEURONTIN) 300 MG capsule TAKE 1 CAPSULE BY MOUTH FOUR TIMES DAILY.  Marland Kitchen glipiZIDE (GLUCOTROL XL) 5 MG 24 hr tablet Take 1 tablet (5 mg total) by mouth daily with breakfast.  . glucose blood test strip 1 each by Other route 4 (four) times daily. Use as instructed qid. E11.65. One touch Ultra  . HYDROcodone-acetaminophen (NORCO) 5-325 MG tablet Take 1 tablet by mouth every 6 (six) hours as needed for moderate pain.  . Insulin Glargine, 2 Unit Dial, (TOUJEO MAX SOLOSTAR) 300 UNIT/ML SOPN Inject 100 Units into the skin at bedtime.  . Lancets MISC 1 each by Does not apply route 4 (four) times daily.  Marland Kitchen losartan (COZAAR) 25 MG tablet TAKE (1) TABLET BY MOUTH ONCE DAILY.  . meclizine (ANTIVERT) 25 MG tablet Take 1 tablet (25 mg total) by mouth 3 (three) times daily as needed for dizziness.  . metFORMIN (GLUCOPHAGE) 500 MG tablet TAKE 2 TABLETS IN THE MORNING, 1 TABLET AT NOON, AND 2 TABLETS IN THEEVENING.  . nitroGLYCERIN (NITROSTAT) 0.4 MG SL tablet Place 1 tablet  (0.4 mg total) under the tongue every 5 (five) minutes as needed for chest pain.  . pantoprazole (PROTONIX) 40 MG tablet Take one daily  . ranolazine (RANEXA) 1000 MG SR tablet Take 1 tablet (1,000 mg total) by mouth 2 (two) times daily for 30 days.  Marland Kitchen sertraline (ZOLOFT) 100 MG tablet Take 1 tablet (100 mg total)  by mouth daily.  . [DISCONTINUED] Insulin Glargine, 2 Unit Dial, (TOUJEO MAX SOLOSTAR) 300 UNIT/ML SOPN Inject 90 Units into the skin at bedtime. (Patient taking differently: Inject 100 Units into the skin at bedtime. )   No facility-administered encounter medications on file as of 01/19/2019.     ALLERGIES: Allergies  Allergen Reactions  . Metoprolol Rash    VACCINATION STATUS: Immunization History  Administered Date(s) Administered  . Influenza,inj,Quad PF,6+ Mos 07/11/2016, 08/02/2017, 08/05/2018  . Influenza-Unspecified 06/18/2013, 06/17/2014, 07/06/2015  . Td 01/28/2014    Diabetes  He presents for his follow-up diabetic visit. He has type 2 diabetes mellitus. Onset time: He was diagnosed at approximate age of 89 years. His disease course has been worsening. There are no hypoglycemic associated symptoms. Pertinent negatives for hypoglycemia include no confusion, headaches, pallor or seizures. Associated symptoms include polydipsia and polyuria. Pertinent negatives for diabetes include no chest pain, no fatigue, no polyphagia and no weakness. There are no hypoglycemic complications. Symptoms are improving. Diabetic complications include heart disease. Risk factors for coronary artery disease include diabetes mellitus, dyslipidemia, hypertension, male sex, obesity, sedentary lifestyle and tobacco exposure. Current diabetic treatment includes oral agent (dual therapy) (He is currently on Invokana 300 mg p.o. daily, metformin 500 mg p.o. twice daily, glipizide 5 mg p.o. twice daily.). His weight is increasing steadily. He is following a generally unhealthy diet. When asked about meal  planning, he reported none. He has not had a previous visit with a dietitian. He rarely participates in exercise. His home blood glucose trend is decreasing steadily. His breakfast blood glucose range is generally 180-200 mg/dl. His bedtime blood glucose range is generally >200 mg/dl. His overall blood glucose range is >200 mg/dl. (He did bring his logs showing persistently above target glycemic profile averaging greater than 200 mg/dL.   His A1c was 9% on July 12, 2018.) An ACE inhibitor/angiotensin II receptor blocker is being taken. Eye exam is current.  Hyperlipidemia  This is a chronic problem. The problem is uncontrolled (He has severe hypertriglyceridemia at 1157.). Exacerbating diseases include diabetes and obesity. Pertinent negatives include no chest pain, myalgias or shortness of breath. Risk factors for coronary artery disease include diabetes mellitus, dyslipidemia, hypertension, male sex, obesity and a sedentary lifestyle.  Hypertension  This is a chronic problem. The current episode started more than 1 year ago. The problem is controlled. Pertinent negatives include no chest pain, headaches, neck pain, palpitations or shortness of breath. Risk factors for coronary artery disease include diabetes mellitus, dyslipidemia, male gender, obesity, sedentary lifestyle and smoking/tobacco exposure. Hypertensive end-organ damage includes CAD/MI.    Review of Systems  Constitutional: Negative for chills, fatigue, fever and unexpected weight change.  HENT: Negative for dental problem, mouth sores and trouble swallowing.   Eyes: Negative for visual disturbance.  Respiratory: Negative for cough, choking, chest tightness, shortness of breath and wheezing.   Cardiovascular: Negative for chest pain, palpitations and leg swelling.  Gastrointestinal: Negative for abdominal distention, abdominal pain, constipation, diarrhea, nausea and vomiting.  Endocrine: Positive for polydipsia and polyuria. Negative  for polyphagia.  Genitourinary: Negative for dysuria, flank pain, hematuria and urgency.  Musculoskeletal: Negative for back pain, gait problem, myalgias and neck pain.  Skin: Negative for pallor, rash and wound.  Neurological: Negative for seizures, syncope, weakness, numbness and headaches.  Psychiatric/Behavioral: Negative for confusion and dysphoric mood.    Objective:    BP 123/86   Pulse 85   Temp 98.4 F (36.9 C)   Ht 6' 2"  (  1.88 m)   Wt 255 lb (115.7 kg)   BMI 32.74 kg/m   Wt Readings from Last 3 Encounters:  01/19/19 255 lb (115.7 kg)  01/13/19 251 lb 11.2 oz (114.2 kg)  01/04/19 245 lb (111.1 kg)     Physical Exam Constitutional:      General: He is not in acute distress.    Appearance: He is well-developed.  HENT:     Head: Normocephalic and atraumatic.  Neck:     Musculoskeletal: Normal range of motion and neck supple.     Thyroid: No thyromegaly.     Trachea: No tracheal deviation.  Cardiovascular:     Rate and Rhythm: Normal rate.     Pulses:          Dorsalis pedis pulses are 1+ on the right side and 1+ on the left side.       Posterior tibial pulses are 1+ on the right side and 1+ on the left side.     Heart sounds: Normal heart sounds, S1 normal and S2 normal. No murmur. No gallop.   Pulmonary:     Effort: No respiratory distress.     Breath sounds: Normal breath sounds. No wheezing.  Abdominal:     General: Bowel sounds are normal. There is no distension.     Palpations: Abdomen is soft.     Tenderness: There is no abdominal tenderness. There is no guarding.  Musculoskeletal:     Right shoulder: He exhibits no swelling and no deformity.  Skin:    General: Skin is warm and dry.     Findings: No rash.     Nails: There is no clubbing.   Neurological:     Mental Status: He is Griffin and oriented to person, place, and time.     Cranial Nerves: No cranial nerve deficit.     Sensory: No sensory deficit.     Gait: Gait normal.     Deep Tendon  Reflexes: Reflexes are normal and symmetric.  Psychiatric:        Speech: Speech normal.        Behavior: Behavior is cooperative.        Judgment: Judgment normal.    CMP     Component Value Date/Time   NA 139 01/14/2019 1139   K 4.0 01/14/2019 1139   CL 99 01/14/2019 1139   CO2 23 01/14/2019 1139   GLUCOSE 196 (H) 01/14/2019 1139   GLUCOSE 298 (H) 01/04/2019 1016   BUN 13 01/14/2019 1139   CREATININE 0.85 01/14/2019 1139   CREATININE 0.96 12/07/2013 0950   CALCIUM 9.1 01/14/2019 1139   PROT 6.8 01/14/2019 1139   ALBUMIN 4.4 01/14/2019 1139   AST 24 01/14/2019 1139   ALT 48 (H) 01/14/2019 1139   ALKPHOS 46 01/14/2019 1139   BILITOT 0.6 01/14/2019 1139   GFRNONAA 97 01/14/2019 1139   GFRAA 113 01/14/2019 1139     Diabetic Labs (most recent): Lab Results  Component Value Date   HGBA1C 8.9 (H) 01/14/2019   HGBA1C 9.0 (H) 10/18/2018   HGBA1C 9.0 (H) 07/12/2018     Lipid Panel ( most recent) Lipid Panel     Component Value Date/Time   CHOL 116 10/18/2018 0903   TRIG 173 (H) 10/18/2018 0903   HDL 30 (L) 10/18/2018 0903   CHOLHDL 3.9 10/18/2018 0903   CHOLHDL 6.4 07/12/2018 0713   VLDL UNABLE TO CALCULATE IF TRIGLYCERIDE OVER 400 mg/dL 07/12/2018 0713   LDLCALC 51 10/18/2018 0903  Lab Results  Component Value Date   TSH 0.525 07/12/2018   TSH 0.458 04/13/2017   TSH 0.493 02/05/2014      Assessment & Plan:   1. DM type 2 causing vascular disease ([place])  - Jared Griffin has currently uncontrolled symptomatic type 2 DM since 58 years of age. -He is returning with improving average fasting blood glucose, his previsit labs showing A1c of 8.9%, unchanged from his prior visits.    -his diabetes is complicated by coronary artery disease, obesity/sedentary life, history of smoking and he remains at a high risk for more acute and chronic complications which include CAD, CVA, CKD, retinopathy, and neuropathy. These are all discussed in detail with  him.  - I have counseled him on diet management and weight loss, by adopting a carbohydrate restricted/protein rich diet.  - Patient admits there is a room for improvement in his diet and drink choices. -  Suggestion is made for him to avoid simple carbohydrates  from his diet including Cakes, Sweet Desserts / Pastries, Ice Cream, Soda (diet and regular), Sweet Tea, Candies, Chips, Cookies, Store Bought Juices, Alcohol in Excess of  1-2 drinks a day, Artificial Sweeteners, and "Sugar-free" Products. This will help patient to have stable blood glucose profile and potentially avoid unintended weight gain.   - I encouraged him to switch to  unprocessed or minimally processed complex starch and increased protein intake (animal or plant source), fruits, and vegetables.  - he is advised to stick to a routine mealtimes to eat 3 meals  a day and avoid unnecessary snacks ( to snack only to correct hypoglycemia).   - he has been scheduled with Jearld Fenton, RDN, CDE for individualized diabetes education.  - I have approached him with the following individualized plan to manage diabetes and patient agrees:   -He will continue to benefit from basal insulin at a higher dose. -I discussed and increased his Toujeo max to 100 units subcutaneously nightly.  He is advised to continue monitoring blood glucose  2 times a day-before breakfast  and at bedtime.   - he is encouraged to call clinic for blood glucose levels less than 70 or above 200 mg /dl. -He has tolerated metformin very well.  He is advised to continue  metformin 1000 mg p.o. twice daily, therapeutically suitable for patient . -I discussed and added glipizide 5 mg XL p.o. daily at breakfast.  -Due to severe hypertriglyceridemia,  he is not a suitable candidate for incretin therapy for now-insulin treatment would also help.   - Patient specific target  A1c;  LDL, HDL, Triglycerides, and  Waist Circumference were discussed in detail.  2) BP/HTN:   his blood pressure is controlled to target.     he is advised to continue his current medications including losartan 25 mg p.o. daily with breakfast .  3) Lipids/HPL:   Review of his recent lipid panel showed unknown LDL due to severe hypertriglyceridemia of 1157+. -He is advised to continue atorvastatin 40 mg p.o. nightly,  he is also on Vascepa 2 g p.o. twice daily.  He is advised to be consistent on his medications.    4)  Weight/Diet:  Body mass index is 32.74 kg/m.  - clearly complicating his diabetes care.  I discussed with him the fact that loss of 5 - 10% of his  current body weight will have the most impact on his diabetes management.  CDE Consult will be initiated . Exercise, and detailed carbohydrates information provided  -  detailed on discharge instructions.  5) Chronic Care/Health Maintenance:  -he  is on ACEI/ARB and Statin medications and  is encouraged to initiate and continue to follow up with Ophthalmology, Dentist,  Podiatrist at least yearly or according to recommendations, and advised to   stay away from smoking. I have recommended yearly flu vaccine and pneumonia vaccine at least every 5 years; moderate intensity exercise for up to 150 minutes weekly; and  sleep for at least 7 hours a day.  - I advised patient to maintain close follow up with Mikey Kirschner, MD for primary care needs.  - Time spent with the patient: 25 min, of which >50% was spent in reviewing his blood glucose logs , discussing his hypoglycemia and hyperglycemia episodes, reviewing his current and  previous labs / studies and medications  doses and developing a plan to avoid hypoglycemia and hyperglycemia. Please refer to Patient Instructions for Blood Glucose Monitoring and Insulin/Medications Dosing Guide"  in media tab for additional information. Please  also refer to " Patient Self Inventory" in the Media  tab for reviewed elements of pertinent patient history.  Jared Griffin participated in  the discussions, expressed understanding, and voiced agreement with the above plans.  All questions were answered to his satisfaction. he is encouraged to contact clinic should he have any questions or concerns prior to his return visit.   Follow up plan: - Return in about 3 months (around 04/21/2019) for Follow up with Pre-visit Labs, Meter, and Logs.  Glade Lloyd, MD [facility] [address] [place], [place] [address] Phone: [phone]  Fax: [phone]    01/19/2019, 9:35 AM  This note was partially dictated with voice recognition software. Similar sounding words can be transcribed inadequately or may not  be corrected upon review.

## 2019-01-21 ENCOUNTER — Encounter (HOSPITAL_COMMUNITY)
Admission: RE | Disposition: A | Discharge status: Home / Self Care | Payer: Self-pay | Source: Home / Self Care | Attending: Internal Medicine

## 2019-01-21 ENCOUNTER — Ambulatory Visit (HOSPITAL_COMMUNITY): Payer: PRIVATE HEALTH INSURANCE | Admitting: Anesthesiology

## 2019-01-21 ENCOUNTER — Other Ambulatory Visit: Payer: Self-pay

## 2019-01-21 ENCOUNTER — Encounter (HOSPITAL_COMMUNITY): Payer: Self-pay

## 2019-01-21 ENCOUNTER — Ambulatory Visit (HOSPITAL_COMMUNITY)
Admission: RE | Admit: 2019-01-21 | Discharge: 2019-01-21 | Disposition: A | Discharge status: Home / Self Care | Payer: PRIVATE HEALTH INSURANCE | Attending: Internal Medicine | Admitting: Internal Medicine

## 2019-01-21 DIAGNOSIS — Z6832 Body mass index (BMI) 32.0-32.9, adult: Secondary | ICD-10-CM | POA: Diagnosis not present

## 2019-01-21 DIAGNOSIS — E782 Mixed hyperlipidemia: Secondary | ICD-10-CM | POA: Diagnosis not present

## 2019-01-21 DIAGNOSIS — R51 Headache: Secondary | ICD-10-CM | POA: Insufficient documentation

## 2019-01-21 DIAGNOSIS — F17210 Nicotine dependence, cigarettes, uncomplicated: Secondary | ICD-10-CM | POA: Insufficient documentation

## 2019-01-21 DIAGNOSIS — K317 Polyp of stomach and duodenum: Secondary | ICD-10-CM | POA: Insufficient documentation

## 2019-01-21 DIAGNOSIS — Z9049 Acquired absence of other specified parts of digestive tract: Secondary | ICD-10-CM | POA: Diagnosis not present

## 2019-01-21 DIAGNOSIS — I10 Essential (primary) hypertension: Secondary | ICD-10-CM | POA: Insufficient documentation

## 2019-01-21 DIAGNOSIS — Z7982 Long term (current) use of aspirin: Secondary | ICD-10-CM | POA: Diagnosis not present

## 2019-01-21 DIAGNOSIS — K2950 Unspecified chronic gastritis without bleeding: Secondary | ICD-10-CM | POA: Diagnosis not present

## 2019-01-21 DIAGNOSIS — K760 Fatty (change of) liver, not elsewhere classified: Secondary | ICD-10-CM | POA: Insufficient documentation

## 2019-01-21 DIAGNOSIS — R1013 Epigastric pain: Secondary | ICD-10-CM | POA: Diagnosis not present

## 2019-01-21 DIAGNOSIS — I739 Peripheral vascular disease, unspecified: Secondary | ICD-10-CM | POA: Insufficient documentation

## 2019-01-21 DIAGNOSIS — Z888 Allergy status to other drugs, medicaments and biological substances status: Secondary | ICD-10-CM | POA: Diagnosis not present

## 2019-01-21 DIAGNOSIS — K219 Gastro-esophageal reflux disease without esophagitis: Secondary | ICD-10-CM | POA: Diagnosis not present

## 2019-01-21 DIAGNOSIS — Z791 Long term (current) use of non-steroidal anti-inflammatories (NSAID): Secondary | ICD-10-CM | POA: Insufficient documentation

## 2019-01-21 DIAGNOSIS — K3189 Other diseases of stomach and duodenum: Secondary | ICD-10-CM | POA: Diagnosis not present

## 2019-01-21 DIAGNOSIS — E669 Obesity, unspecified: Secondary | ICD-10-CM | POA: Diagnosis not present

## 2019-01-21 DIAGNOSIS — E119 Type 2 diabetes mellitus without complications: Secondary | ICD-10-CM | POA: Diagnosis not present

## 2019-01-21 DIAGNOSIS — M1990 Unspecified osteoarthritis, unspecified site: Secondary | ICD-10-CM | POA: Insufficient documentation

## 2019-01-21 DIAGNOSIS — M9251 Juvenile osteochondrosis of tibia and fibula, right leg: Secondary | ICD-10-CM | POA: Diagnosis not present

## 2019-01-21 DIAGNOSIS — G8929 Other chronic pain: Secondary | ICD-10-CM | POA: Insufficient documentation

## 2019-01-21 DIAGNOSIS — K7460 Unspecified cirrhosis of liver: Secondary | ICD-10-CM | POA: Insufficient documentation

## 2019-01-21 DIAGNOSIS — Z86010 Personal history of colonic polyps: Secondary | ICD-10-CM | POA: Diagnosis not present

## 2019-01-21 DIAGNOSIS — J449 Chronic obstructive pulmonary disease, unspecified: Secondary | ICD-10-CM | POA: Diagnosis not present

## 2019-01-21 DIAGNOSIS — F329 Major depressive disorder, single episode, unspecified: Secondary | ICD-10-CM | POA: Diagnosis not present

## 2019-01-21 DIAGNOSIS — Z955 Presence of coronary angioplasty implant and graft: Secondary | ICD-10-CM | POA: Insufficient documentation

## 2019-01-21 DIAGNOSIS — Z794 Long term (current) use of insulin: Secondary | ICD-10-CM | POA: Diagnosis not present

## 2019-01-21 DIAGNOSIS — F419 Anxiety disorder, unspecified: Secondary | ICD-10-CM | POA: Insufficient documentation

## 2019-01-21 DIAGNOSIS — Z79899 Other long term (current) drug therapy: Secondary | ICD-10-CM | POA: Diagnosis not present

## 2019-01-21 DIAGNOSIS — Z7951 Long term (current) use of inhaled steroids: Secondary | ICD-10-CM | POA: Diagnosis not present

## 2019-01-21 DIAGNOSIS — K228 Other specified diseases of esophagus: Secondary | ICD-10-CM | POA: Diagnosis not present

## 2019-01-21 DIAGNOSIS — I6529 Occlusion and stenosis of unspecified carotid artery: Secondary | ICD-10-CM | POA: Insufficient documentation

## 2019-01-21 DIAGNOSIS — I25119 Atherosclerotic heart disease of native coronary artery with unspecified angina pectoris: Secondary | ICD-10-CM | POA: Insufficient documentation

## 2019-01-21 HISTORY — DX: Dyspnea, unspecified: R06.00

## 2019-01-21 HISTORY — DX: Chronic obstructive pulmonary disease, unspecified: J44.9

## 2019-01-21 HISTORY — PX: ESOPHAGOGASTRODUODENOSCOPY (EGD) WITH PROPOFOL: SHX5813

## 2019-01-21 HISTORY — DX: Angina pectoris, unspecified: I20.9

## 2019-01-21 HISTORY — DX: Atherosclerotic heart disease of native coronary artery without angina pectoris: I25.10

## 2019-01-21 HISTORY — PX: BIOPSY: SHX5522

## 2019-01-21 LAB — GLUCOSE, CAPILLARY
Glucose-Capillary: 237 mg/dL — ABNORMAL HIGH (ref 70–99)
Glucose-Capillary: 177 mg/dL — ABNORMAL HIGH (ref 70–99)

## 2019-01-21 SURGERY — ESOPHAGOGASTRODUODENOSCOPY (EGD) WITH PROPOFOL
Anesthesia: General

## 2019-01-21 MED ORDER — CHLORHEXIDINE GLUCONATE CLOTH 2 % EX PADS: 6.0000 | MEDICATED_PAD | Freq: Once | CUTANEOUS | Status: DC

## 2019-01-21 MED ORDER — MIDAZOLAM HCL 2 MG/2ML IJ SOLN: 0.5000 mg | Freq: Once | INTRAMUSCULAR | Status: DC | PRN

## 2019-01-21 MED ORDER — HYDROMORPHONE HCL 1 MG/ML IJ SOLN: 0.2500 mg | INTRAMUSCULAR | Status: DC | PRN

## 2019-01-21 MED ORDER — PROMETHAZINE HCL 25 MG/ML IJ SOLN: 6.2500 mg | INTRAMUSCULAR | Status: DC | PRN

## 2019-01-21 MED ORDER — HYDROCODONE-ACETAMINOPHEN 7.5-325 MG PO TABS: 1.0000 | ORAL_TABLET | Freq: Once | ORAL | Status: DC | PRN

## 2019-01-21 MED ORDER — LACTATED RINGERS IV SOLN
INTRAVENOUS | Status: DC
  Administered 2019-01-21: 09:00:00 via INTRAVENOUS

## 2019-01-21 MED ORDER — PROPOFOL 500 MG/50ML IV EMUL
INTRAVENOUS | Status: DC | PRN
  Administered 2019-01-21: 200 ug/kg/min via INTRAVENOUS

## 2019-01-21 MED ORDER — PROPOFOL 10 MG/ML IV BOLUS
INTRAVENOUS | Status: DC | PRN
  Administered 2019-01-21: 20 mg via INTRAVENOUS
  Administered 2019-01-21: 40 mg via INTRAVENOUS
  Administered 2019-01-21: 20 mg via INTRAVENOUS

## 2019-01-21 MED ORDER — LIDOCAINE 5 % EX PTCH: 1.0000 | MEDICATED_PATCH | Freq: Two times a day (BID) | CUTANEOUS | 0 refills | Status: DC

## 2019-01-21 NOTE — Discharge Instructions (Signed)
Resume aspirin on 01/22/2019. Resume other medications and diet as before. Lidoderm patch to be used as directed.  12 hours on and 12 hours off. Resume usual diet. No driving for 24 hours. Physician will call with biopsy results.

## 2019-01-21 NOTE — Anesthesia Preprocedure Evaluation (Addendum)
Anesthesia Evaluation  Patient identified by MRN, date of birth, ID band Patient awake    Reviewed: Allergy & Precautions, NPO status , Patient's Chart, lab work & pertinent test results, reviewed documented beta blocker date and time   Airway Mallampati: II  TM Distance: >3 FB Neck ROM: Full    Dental no notable dental hx. (+) Teeth Intact   Pulmonary shortness of breath and with exertion, asthma , COPD,  COPD inhaler, former smoker,    Pulmonary exam normal breath sounds clear to auscultation       Cardiovascular Exercise Tolerance: Good hypertension, + angina with exertion + CAD and + Peripheral Vascular Disease  Normal cardiovascular examI Rhythm:Regular Rate:Normal  Reports walking over one mile/day a month ago prior to LLE pain .  States last NTG was ~ a week ago, on Ranexa, Echo EF normal  Question if CP is GI in nature -here for EGD today    Neuro/Psych  Headaches, PSYCHIATRIC DISORDERS Anxiety Depression Bilat subcritical Carotid stenosis ~50 %  Neuromuscular disease    GI/Hepatic Neg liver ROS, GERD  Medicated and Controlled,  Endo/Other  negative endocrine ROSdiabetes, Well Controlled, Type 1, Insulin Dependent  Renal/GU negative Renal ROS  negative genitourinary   Musculoskeletal  (+) Arthritis , Osteoarthritis,    Abdominal   Peds negative pediatric ROS (+)  Hematology negative hematology ROS (+)   Anesthesia Other Findings   Reproductive/Obstetrics negative OB ROS                            Anesthesia Physical Anesthesia Plan  ASA: III  Anesthesia Plan: General   Post-op Pain Management:    Induction: Intravenous  PONV Risk Score and Plan:   Airway Management Planned: Nasal Cannula and Simple Face Mask  Additional Equipment:   Intra-op Plan:   Post-operative Plan:   Informed Consent: I have reviewed the patients History and Physical, chart, labs and discussed  the procedure including the risks, benefits and alternatives for the proposed anesthesia with the patient or authorized representative who has indicated his/her understanding and acceptance.     Dental advisory given  Plan Discussed with: CRNA  Anesthesia Plan Comments: (Full PPE use planned  GA with GETA back up d/w pt -+ Q&A-WTP with same  Dr. Laural Golden deemed this case appropriate in light of current COVID concerns )        Anesthesia Quick Evaluation

## 2019-01-21 NOTE — Transfer of Care (Signed)
Immediate Anesthesia Transfer of Care Note  Patient: Jared Griffin  Procedure(s) Performed: ESOPHAGOGASTRODUODENOSCOPY (EGD) WITH PROPOFOL (N/A ) BIOPSY  Patient Location: PACU  Anesthesia Type:General  Level of Consciousness: awake, alert  and patient cooperative  Airway & Oxygen Therapy: Patient Spontanous Breathing  Post-op Assessment: Report given to RN and Post -op Vital signs reviewed and stable  Post vital signs: Reviewed and stable  Last Vitals:  Vitals Value Taken Time  BP 109/64 01/21/2019 10:34 AM  Temp    Pulse 84 01/21/2019 10:35 AM  Resp 19 01/21/2019 10:35 AM  SpO2 94 % 01/21/2019 10:35 AM  Vitals shown include unvalidated device data.  Last Pain:  Vitals:   01/21/19 0909  TempSrc: Oral  PainSc: 4          Complications: No apparent anesthesia complications

## 2019-01-21 NOTE — Op Note (Signed)
[facility] Patient Name: Jared Griffin Procedure Date: 01/21/2019 9:56 AM MRN: [id number] Date of Birth: [birth year] Attending MD: Hildred Laser , MD CSN: [id number] Age: 58 Admit Type: Outpatient Procedure:                Upper GI endoscopy Indications:              Epigastric abdominal pain Providers:                Hildred Laser, MD, Hinton Rao, RN, Raphael Gibney, Technician, Randa Spike, Technician Referring MD:             Grace Bushy. Luking, MD Medicines:                Propofol per Anesthesia Complications:            No immediate complications. Estimated Blood Loss:     Estimated blood loss was minimal. Procedure:                Pre-Anesthesia Assessment:                           - Prior to the procedure, a History and Physical                            was performed, and patient medications and                            allergies were reviewed. The patient's tolerance of                            previous anesthesia was also reviewed. The risks                            and benefits of the procedure and the sedation                            options and risks were discussed with the patient.                            All questions were answered, and informed consent                            was obtained. Prior Anticoagulants: The patient has                            taken no previous anticoagulant or antiplatelet                            agents except for aspirin. ASA Grade Assessment:                            III - A patient with severe systemic disease. After  reviewing the risks and benefits, the patient was                            deemed in satisfactory condition to undergo the                            procedure.                           After obtaining informed consent, the endoscope was                            passed under direct vision. Throughout the   procedure, the patient's blood pressure, pulse, and                            oxygen saturations were monitored continuously. The                            GIF-H190 ([id number]) scope was introduced through the                            mouth, and advanced to the second part of duodenum.                            The upper GI endoscopy was accomplished without                            difficulty. The patient tolerated the procedure                            well. Scope In: 10:18:29 AM Scope Out: 10:29:50 AM Total Procedure Duration: 0 hours 11 minutes 21 seconds  Findings:      The examined esophagus was normal.      The Z-line was irregular and was found 42 cm from the incisors.      Multiple 3 to 7 mm pedunculated and sessile polyps with no bleeding and       no stigmata of recent bleeding were found in the gastric fundus and in       the gastric body. Biopsies were taken with a cold forceps for histology.       The pathology specimen was placed into Bottle Number 1.      Localized mildly erythematous mucosa without bleeding was found in the       gastric body. Biopsies were taken with a cold forceps for histology. The       pathology specimen was placed into Bottle Number 2.      The exam of the stomach was otherwise normal.      The duodenal bulb and second portion of the duodenum were normal. Impression:               - Normal esophagus.                           - Z-line irregular, 42 cm from the incisors.                           -  Multiple gastric polyps. Biopsied.                           - Erythematous mucosa in the gastric body. Biopsied.                           - Normal duodenal bulb and second portion of the                            duodenum.                           Comment: no abnormality noted to account patient's                            pain. Moderate Sedation:      Per Anesthesia Care Recommendation:           - Patient has a contact number available  for                            emergencies. The signs and symptoms of potential                            delayed complications were discussed with the                            patient. Return to normal activities tomorrow.                            Written discharge instructions were provided to the                            patient.                           - Resume previous diet today.                           - Continue present medications.                           - No aspirin, ibuprofen, naproxen, or other                            non-steroidal anti-inflammatory drugs for 1 day.                           - Lidoderm patach as directe.                           - Await pathology results. Procedure Code(s):        --- Professional ---                           [phone], Esophagogastroduodenoscopy, flexible,  transoral; with biopsy, single or multiple Diagnosis Code(s):        --- Professional ---                           K22.8, Other specified diseases of esophagus                           K31.7, Polyp of stomach and duodenum                           K31.89, Other diseases of stomach and duodenum                           R10.13, Epigastric pain CPT copyright 2019 American [employer] Association. All rights reserved. The codes documented in this report are preliminary and upon coder review may  be revised to meet current compliance requirements. Hildred Laser, MD Hildred Laser, MD 01/21/2019 10:45:29 AM This report has been signed electronically. Number of Addenda: 0

## 2019-01-21 NOTE — Interval H&P Note (Signed)
Patient was seen in the office last week for epigastric/left upper quadrant pain radiating to retrosternal area. Pain was felt to be noncardiac.  He was evaluated in emergency room prior to that office visit.  He had been on meloxicam.  I felt he may have peptic ulcer disease.  He was treated with PPI but he does not feel any better. He has nausea.  Most of his pain is in mid epigastric region and radiates into left upper quadrant and lower chest.  He has not experienced vomiting melena or rectal bleeding. Patient is alert and in no acute distress. Cardiac exam with regular rhythm normal S1 and S2.  No murmur gallop noted. Auscultation of lungs reveal vesicular breath sounds bilaterally. Abdomen is full.  It is soft on palpation.  He has tenderness in mid epigastric region as well as left upper quadrant most which appears to be superficial.  No organomegaly or masses. Patient is agreeable to proceed with diagnostic EGD. History and Physical Interval Note:  01/21/2019 10:04 AM  [name]  has presented today for surgery, with the diagnosis of Epigastric Pain.  The various methods of treatment have been discussed with the patient and family. After consideration of risks, benefits and other options for treatment, the patient has consented to  Procedure(s) with comments: ESOPHAGOGASTRODUODENOSCOPY (EGD) WITH PROPOFOL (N/A) - 10:15am as a surgical intervention.  The patient's history has been reviewed, patient examined, no change in status, stable for surgery.  I have reviewed the patient's chart and labs.  Questions were answered to the patient's satisfaction.     [employer]

## 2019-01-21 NOTE — Anesthesia Postprocedure Evaluation (Signed)
Anesthesia Post Note  Patient: Jared Griffin  Procedure(s) Performed: ESOPHAGOGASTRODUODENOSCOPY (EGD) WITH PROPOFOL (N/A ) BIOPSY  Patient location during evaluation: PACU Anesthesia Type: General Level of consciousness: awake and alert and patient cooperative Pain management: satisfactory to patient Vital Signs Assessment: post-procedure vital signs reviewed and stable Respiratory status: spontaneous breathing Cardiovascular status: stable Postop Assessment: no apparent nausea or vomiting Anesthetic complications: no     Last Vitals:  Vitals:   01/21/19 1030 01/21/19 1035  BP: 109/64 109/64  Pulse:  84  Resp: 19 19  Temp: 36.9 C   SpO2: 96% 94%    Last Pain:  Vitals:   01/21/19 1035  TempSrc:   PainSc: 0-No pain                 Kela Baccari

## 2019-01-22 ENCOUNTER — Telehealth: Payer: Self-pay

## 2019-01-22 MED ORDER — ICOSAPENT ETHYL 1 G PO CAPS: 2.0000 | ORAL_CAPSULE | Freq: Two times a day (BID) | ORAL | 11 refills | Status: DC

## 2019-01-22 NOTE — Telephone Encounter (Signed)
Vascepa 1 GM approved by [employer].I left message for pt to call.I don't see where is is on it  Pt ID [id number]   GPI/NDC [id number]

## 2019-01-22 NOTE — Telephone Encounter (Signed)
E-scribed to [employer], Vascepa 1 Gm, sig: take 2 capsules by mouth twice a day

## 2019-01-27 ENCOUNTER — Telehealth (HOSPITAL_COMMUNITY): Payer: Self-pay | Admitting: Physical Therapy

## 2019-01-27 ENCOUNTER — Encounter (HOSPITAL_COMMUNITY): Payer: Self-pay | Admitting: Internal Medicine

## 2019-01-27 NOTE — Telephone Encounter (Signed)
was contacted today by phone regarding resuming therapy services following our temporary reduction of Services secondary to COVID-19.  Patient was not available but therapist left a message to return call to discuss his options moving forward.  Teena Irani, PTA/CLT [phone]

## 2019-02-02 ENCOUNTER — Encounter: Payer: Self-pay | Admitting: Family Medicine

## 2019-02-02 ENCOUNTER — Ambulatory Visit (INDEPENDENT_AMBULATORY_CARE_PROVIDER_SITE_OTHER): Payer: PRIVATE HEALTH INSURANCE | Admitting: Family Medicine

## 2019-02-02 ENCOUNTER — Telehealth: Payer: Self-pay | Admitting: Family Medicine

## 2019-02-02 ENCOUNTER — Other Ambulatory Visit: Payer: Self-pay

## 2019-02-02 ENCOUNTER — Other Ambulatory Visit: Payer: Self-pay | Admitting: *Deleted

## 2019-02-02 DIAGNOSIS — R42 Dizziness and giddiness: Secondary | ICD-10-CM

## 2019-02-02 DIAGNOSIS — J4540 Moderate persistent asthma, uncomplicated: Secondary | ICD-10-CM | POA: Diagnosis not present

## 2019-02-02 DIAGNOSIS — E785 Hyperlipidemia, unspecified: Secondary | ICD-10-CM

## 2019-02-02 DIAGNOSIS — I10 Essential (primary) hypertension: Secondary | ICD-10-CM | POA: Diagnosis not present

## 2019-02-02 MED ORDER — DILTIAZEM HCL 30 MG PO TABS: ORAL_TABLET | ORAL | 1 refills | Status: DC

## 2019-02-02 MED ORDER — CEFDINIR 300 MG PO CAPS: 300.0000 mg | ORAL_CAPSULE | Freq: Two times a day (BID) | ORAL | 0 refills | Status: DC

## 2019-02-02 MED ORDER — GABAPENTIN 300 MG PO CAPS: 300.0000 mg | ORAL_CAPSULE | Freq: Four times a day (QID) | ORAL | 5 refills | Status: DC

## 2019-02-02 MED ORDER — FLUTICASONE PROPIONATE HFA 110 MCG/ACT IN AERO: 2.0000 | INHALATION_SPRAY | Freq: Two times a day (BID) | RESPIRATORY_TRACT | 5 refills | Status: DC

## 2019-02-02 MED ORDER — PANTOPRAZOLE SODIUM 40 MG PO TBEC: DELAYED_RELEASE_TABLET | ORAL | 1 refills | Status: DC

## 2019-02-02 NOTE — Telephone Encounter (Signed)
Pt had virtual visit this morning and forgot to mention he feels he has a sinus infection  Pt with productive cough & nasal congestion - green in color, pain & pressure, HA  NO fever, vomiting, diarrhea, SOB, loss of smell   Wants to know if we can call something in?   Please advise & call pt   [place]

## 2019-02-02 NOTE — Telephone Encounter (Signed)
omnicef 300 bid tend

## 2019-02-02 NOTE — Telephone Encounter (Signed)
Med sent to pharm. Pt notified.  

## 2019-02-02 NOTE — Progress Notes (Signed)
   Subjective:    Patient ID: Jared Griffin, male    DOB: May 04, 1961, 58 y.o.   MRN: [id number] Audio plus video HPImed check up. Pt sees dr nida for diabetes and cardiologist Dr. Raliegh Ip.   Pt states breathing is getting worse. Sob with activity. Using flovent bid.  Uses faithfully.  Rare use of albuterol.  Was not sure if he could use it.  States he has his yearly sinus infection.  Got lightheaded last week and fell.  The dizziness was a spinning sensation.  Lasted transiently.  Occurred when he turned suddenly.  Also gets them at times when he lays back.  True spinning sensation last number of seconds.  Fairly incapacitating then resolves.  Virtual Visit via Video Note  I connected with Jared Griffin on 02/02/19 at 10:00 AM EDT by a video enabled telemedicine application and verified that I am speaking with the correct person using two identifiers.  Location: Patient: home Provider: office   I discussed the limitations of evaluation and management by telemedicine and the availability of in person appointments. The patient expressed understanding and agreed to proceed.  History of Present Illness:    Observations/Objective:   Assessment and Plan:   Follow Up Instructions:    I discussed the assessment and treatment plan with the patient. The patient was provided an opportunity to ask questions and all were answered. The patient agreed with the plan and demonstrated an understanding of the instructions.   The patient was advised to call back or seek an in-person evaluation if the symptoms worsen or if the condition fails to improve as anticipated.  I provided25 minutes of non-face-to-face time during this encounter.   Hypertension  Blood pressure medicine and blood pressure levels reviewed today with patient. Compliant with blood pressure medicine. States does not miss a dose. No obvious side effects. Blood pressure generally good when checked elsewhere. Watching  salt intake.  Patient continues to take lipid medication regularly. No obvious side effects from it. Generally does not miss a dose. Prior blood work results are reviewed with patient. Patient continues to work on fat intake in diet    Review of Systems No headache, no major weight loss or weight gain, no chest pain no back pain abdominal pain no change in bowel habits complete ROS otherwise negative     Objective:   Physical Exam  Virtual visit      Assessment & Plan:  Impression 1 transient dizziness.  Inner ear/vertigo per description.  No treatment rationale discussed  2.  Moderate persistent asthma.  Suboptimal control.  Discussed.  Will increase Flovent to 110 mics 2 puffs twice daily.  Maintain albuterol  3.  Hyperlipidemia last blood work.  Patient maintain same  Follow-up in 6 months for wellness plus chronic symptom care discussed medications refilled

## 2019-02-04 ENCOUNTER — Ambulatory Visit: Payer: Self-pay | Admitting: Orthopedic Surgery

## 2019-02-04 ENCOUNTER — Telehealth: Payer: Self-pay | Admitting: Radiology

## 2019-02-04 ENCOUNTER — Telehealth: Payer: Self-pay | Admitting: *Deleted

## 2019-02-04 ENCOUNTER — Encounter: Payer: Self-pay | Admitting: Radiology

## 2019-02-04 NOTE — Telephone Encounter (Signed)
Called left message for nurse case manager  [phone], ext [phone] To call us back  Dr [name] wants to have virtual visit with him if they will approve this  His appointment today was cancelled because he presented to the office with a cough and is taking ABX for a sinus infection

## 2019-02-04 NOTE — Telephone Encounter (Signed)
Patient verbally consented for telehealth visits with [facility] and understands that his insurance company will be billed for the encounter.   Aware to have vitals available

## 2019-02-04 NOTE — Progress Notes (Signed)
Yes, and we can make a virtual visit for him if okay with WC, I will call when I am not in clinic, likely tomorrow afternoon.

## 2019-02-04 NOTE — Progress Notes (Signed)
Patient came in for his appointment with Dr [name] today, and told me that he has a sinus infection.  He saw his provider Friday for this, and is on medication for this.  He says he is coughing up a lot of mucous.  I advised him we need to reschedule this visit.  He was concerned about having to reschedule, since this is WC.  He said he will call back to reschedule the appointment and left.  Can you please advise WC of what happened?

## 2019-02-05 NOTE — Telephone Encounter (Signed)
Left another message for her to call back.

## 2019-02-10 ENCOUNTER — Other Ambulatory Visit: Payer: Self-pay

## 2019-02-10 ENCOUNTER — Encounter: Payer: Self-pay | Admitting: Cardiovascular Disease

## 2019-02-10 ENCOUNTER — Telehealth (INDEPENDENT_AMBULATORY_CARE_PROVIDER_SITE_OTHER): Payer: PRIVATE HEALTH INSURANCE | Admitting: Cardiovascular Disease

## 2019-02-10 VITALS — BP 116/80 | HR 82 | Ht 74.0 in | Wt 250.0 lb

## 2019-02-10 DIAGNOSIS — I25118 Atherosclerotic heart disease of native coronary artery with other forms of angina pectoris: Secondary | ICD-10-CM

## 2019-02-10 DIAGNOSIS — I10 Essential (primary) hypertension: Secondary | ICD-10-CM

## 2019-02-10 DIAGNOSIS — E119 Type 2 diabetes mellitus without complications: Secondary | ICD-10-CM

## 2019-02-10 DIAGNOSIS — E785 Hyperlipidemia, unspecified: Secondary | ICD-10-CM

## 2019-02-10 DIAGNOSIS — Z955 Presence of coronary angioplasty implant and graft: Secondary | ICD-10-CM

## 2019-02-10 NOTE — Progress Notes (Signed)
Virtual Visit via Video Note   This visit type was conducted due to national recommendations for restrictions regarding the COVID-19 Pandemic (e.g. social distancing) in an effort to limit this patient's exposure and mitigate transmission in our community.  Due to his co-morbid illnesses, this patient is at least at moderate risk for complications without adequate follow up.  This format is felt to be most appropriate for this patient at this time.  All issues noted in this document were discussed and addressed.  A limited physical exam was performed with this format.  Please refer to the patient's chart for his consent to telehealth for [facility].   Date:  02/10/2019   ID:  [name], DOB 30-Nov-1960, MRN [id number]  Patient Location: Home Provider Location: Office  PCP:  Mikey Kirschner, MD  Cardiologist:  Kate Sable, MD  Electrophysiologist:  None   Evaluation Performed:  Follow-Up Visit  Chief Complaint:  Chest pain  History of Present Illness:    Jared Griffin is a 58 y.o. male with a history of coronary artery disease and underwent coronary angiography most recently on 02/26/2018 which demonstrated a widely patent LAD stent with minimal restenosis.  LVEDP was normal as was left ventricular systolic function, LVEF 55 to 65% by visual estimate.  There were some 25% lesions in the second diagonal and mid LAD and a 10% stenosis in the mid RCA at that time.  He presented to the [facility] ED on 01/04/2019 with complaints of chest pain.  Troponins, d-dimer, chest x-ray, and ECG were normal.  Symptoms did not show any significant improvement with nitroglycerin or morphine.  He underwent an EGD on 01/21/2019 which I personally reviewed which demonstrated multiple gastric polyps but no findings to explain the patient's chest pain.  He denies chest pain.  He does have exertional dyspnea.  He has put on 15 pounds over since going on insulin.  He is in the  process of filing for disability.  The patient does not have symptoms concerning for COVID-19 infection (fever, chills, cough, or new shortness of breath).    Past Medical History:  Diagnosis Date   Anginal pain ([place])    Anxiety    Cirrhosis of liver without mention of alcohol 2003   COPD (chronic obstructive pulmonary disease) ([place])    very close to being diagnosed   Coronary artery disease    Coronary atherosclerosis of native coronary artery    a. 12/09/2013: s/p PCI with 3.0 x 28 Promus DES to mLAD   Cyst (solitary) of breast 10/03/2017   removed from back   Depression    Dyspnea    take inhaler   Elbow injury 07/25/2017   surgery for torn tendon   Essential hypertension, benign    Fatty liver disease, nonalcoholic    Lateral epicondylitis of right elbow    Mixed hyperlipidemia 2006   Obesity    Osgood-Schlatter's disease of right knee    Type 2 diabetes mellitus ([place])    Past Surgical History:  Procedure Laterality Date   BIOPSY  01/21/2019   Procedure: BIOPSY;  Surgeon: [name], MD;  [facility];  Service: Endoscopy;;  gastric bx and gastric polyps   CARDIAC CATHETERIZATION  2011   CARDIAC CATHETERIZATION N/A 09/20/2015   Procedure: Left Heart Cath and Coronary Angiography;  Surgeon: [name], MD;  Location: [place] CV LAB;  Service: Cardiovascular;  Laterality: N/A;   CHOLECYSTECTOMY  2003  COLONOSCOPY  06/19/2012   Procedure: COLONOSCOPY;  Surgeon: [name], MD;  [facility];  Service: Endoscopy;  Laterality: N/A;  930   COLONOSCOPY N/A 10/17/2017   Procedure: COLONOSCOPY;  Surgeon: [name], MD;  [facility];  Service: Endoscopy;  Laterality: N/A;  1225   ESOPHAGOGASTRODUODENOSCOPY (EGD) WITH PROPOFOL N/A 01/21/2019   Procedure: ESOPHAGOGASTRODUODENOSCOPY (EGD) WITH PROPOFOL;  Surgeon: [name], MD;  [facility];  Service: Endoscopy;  Laterality: N/A;   10:15am   KNEE ARTHROPLASTY Right 1999   LEFT HEART CATH AND CORONARY ANGIOGRAPHY N/A 02/26/2018   Procedure: LEFT HEART CATH AND CORONARY ANGIOGRAPHY;  Surgeon: [name], MD;  Location: [place] CV LAB;  Service: Cardiovascular;  Laterality: N/A;   LEFT HEART CATHETERIZATION WITH CORONARY ANGIOGRAM N/A 12/09/2013   Procedure: LEFT HEART CATHETERIZATION WITH CORONARY ANGIOGRAM;  Surgeon: [name], MD;  Location[facility] CATH LAB;  Service: Cardiovascular;  Laterality: N/A;   Liver biopsy     POLYPECTOMY  10/17/2017   Procedure: POLYPECTOMY;  Surgeon: [name], MD;  [facility];  Service: Endoscopy;;  ascending colon, splenic flexure,sigmoid x2   TENNIS ELBOW RELEASE/NIRSCHEL PROCEDURE Right 07/25/2017   Procedure: TENNIS ELBOW RELEASE and debriedment;  Surgeon: [name], MD;  Location: [facility] ORS;  Service: Orthopedics;  Laterality: Right;   TONSILLECTOMY  1970's     Current Meds  Medication Sig   albuterol (PROVENTIL HFA;VENTOLIN HFA) 108 (90 Base) MCG/ACT inhaler Inhale 2 puffs into the lungs every 6 (six) hours as needed for wheezing or shortness of breath.   aspirin (ASPIR-LOW) 81 MG EC tablet Take 1 tablet (81 mg total) by mouth daily.   atorvastatin (LIPITOR) 40 MG tablet TAKE ONE (1) TABLET BY MOUTH EVERY DAY   cefdinir (OMNICEF) 300 MG capsule Take 1 capsule (300 mg total) by mouth 2 (two) times daily.   clonazePAM (KLONOPIN) 0.5 MG tablet Take 1 tablet (0.5 mg total) by mouth 3 (three) times daily as needed for anxiety.   diltiazem (CARDIZEM) 30 MG tablet TAKE (1) TABLET BY MOUTH TWICE DAILY.   fluticasone (FLOVENT HFA) 110 MCG/ACT inhaler Inhale 2 puffs into the lungs 2 (two) times a day.   fluticasone (FLOVENT HFA) 44 MCG/ACT inhaler Inhale 2 puffs into the lungs 2 (two) times daily.   gabapentin (NEURONTIN) 300 MG capsule Take 1 capsule (300 mg total) by mouth 4 (four) times daily.   glipiZIDE (GLUCOTROL XL) 5 MG 24 hr  tablet Take 1 tablet (5 mg total) by mouth daily with breakfast.   glucose blood test strip 1 each by Other route 4 (four) times daily. Use as instructed qid. E11.65. One touch Ultra   Icosapent Ethyl (VASCEPA) 1 g CAPS Take 2 capsules (2 g total) by mouth 2 (two) times a day.   Insulin Glargine, 2 Unit Dial, (TOUJEO MAX SOLOSTAR) 300 UNIT/ML SOPN Inject 100 Units into the skin at bedtime.   Lancets MISC 1 each by Does not apply route 4 (four) times daily.   lidocaine (LIDODERM) 5 % Place 1 patch onto the skin every 12 (twelve) hours. Remove & Discard patch within 12 hours or as directed by MD   losartan (COZAAR) 25 MG tablet TAKE (1) TABLET BY MOUTH ONCE DAILY.   meclizine (ANTIVERT) 25 MG tablet Take 1 tablet (25 mg total) by mouth 3 (three) times daily as needed for dizziness.   metFORMIN (GLUCOPHAGE) 500 MG tablet TAKE 2 TABLETS IN  THE MORNING, 1 TABLET AT NOON, AND 2 TABLETS IN THEEVENING.   nitroGLYCERIN (NITROSTAT) 0.4 MG SL tablet Place 1 tablet (0.4 mg total) under the tongue every 5 (five) minutes as needed for chest pain.   pantoprazole (PROTONIX) 40 MG tablet Take one daily   sertraline (ZOLOFT) 100 MG tablet Take 1 tablet (100 mg total) by mouth daily.     Allergies:   Metoprolol   Social History   Tobacco Use   Smoking status: Former Smoker    Packs/day: 0.50    Years: 10.00    Pack years: 5.00    Types: Cigarettes    Start date: 02/02/1973    Last attempt to quit: 09/17/1983    Years since quitting: 35.4   Smokeless tobacco: Former Systems developer    Types: Chew    Quit date: 09/17/1983  Substance Use Topics   Alcohol use: No    Alcohol/week: 0.0 standard drinks   Drug use: No     Family Hx: The patient's family history includes CVA in his maternal grandfather and maternal grandmother; Cancer in his maternal aunt.  ROS:   Please see the history of present illness.     All other systems reviewed and are negative.   Prior CV studies:   The following  studies were reviewed today:  Cardiac catheterization 02/26/2018:   Ost 2nd Diag to 2nd Diag lesion is 25% stenosed.  Prior LAD stent is widely patent with minimal restenosis.  Mid LAD-2 lesion is 25% stenosed.  Mid RCA lesion is 10% stenosed.  The left ventricular systolic function is normal.  LV end diastolic pressure is normal.  The left ventricular ejection fraction is 55-65% by visual estimate.  There is no aortic valve stenosis.  JL4 works well from the right wrist.  Continue aggressive secondary prevention. Investigate noncardiac causes of chest pain.  Echocardiogram 07/12/2018:  - Left ventricle: The cavity size was normal. Wall thickness was increased in a pattern of moderate LVH. Systolic function was normal. The estimated ejection fraction was in the range of 60% to 65%. Wall motion was normal; there were no regional wall motion abnormalities. The study is not technically sufficient to allow evaluation of LV diastolic function. - Aortic valve: Valve area (VTI): 4.17 cm^2. Valve area (Vmax): 5.11 cm^2. Valve area (Vmean): 5.16 cm^2. - Atrial septum: No defect or patent foramen ovale was identified.  Labs/Other Tests and Data Reviewed:    EKG:  No ECG reviewed.  Recent Labs: 07/12/2018: TSH 0.525 01/05/2019: Hemoglobin 14.8; Platelets 151 01/14/2019: ALT 48; BUN 13; Creatinine, Ser 0.85; Potassium 4.0; Sodium 139   Recent Lipid Panel Lab Results  Component Value Date/Time   CHOL 116 10/18/2018 09:03 AM   TRIG 173 (H) 10/18/2018 09:03 AM   HDL 30 (L) 10/18/2018 09:03 AM   CHOLHDL 3.9 10/18/2018 09:03 AM   CHOLHDL 6.4 07/12/2018 07:13 AM   LDLCALC 51 10/18/2018 09:03 AM    Wt Readings from Last 3 Encounters:  02/10/19 250 lb (113.4 kg)  01/21/19 254 lb 13.6 oz (115.6 kg)  01/19/19 255 lb (115.7 kg)     Objective:    Vital Signs:  BP 116/80    Pulse 82    Ht 6' 2"  (1.88 m)    Wt 250 lb (113.4 kg)    BMI 32.10 kg/m    VITAL SIGNS:   reviewed GEN:  no acute distress EYES:  sclerae anicteric, EOMI - Extraocular Movements Intact RESPIRATORY:  normal respiratory effort, symmetric expansion MUSCULOSKELETAL:  no obvious deformities. NEURO:  alert and oriented x 3, no obvious focal deficit PSYCH:  normal affect  ASSESSMENT & PLAN:    1.  Coronary artery disease: Symptomatically stable.  Cardiac catheterization results from June 2019 reviewed above.  Continue aspirin, atorvastatin and Ranexa.  I increased Ranexa to 1000 mg twice daily on 01/05/2019.  He has an elevated HbA1c and may have microvascular angina.  He also has a history of anxiety and depression.  Shortness of breath may be due to moderate persistent asthma and seasonal allergies along with weight gain.  2.  Hypertension: Blood pressure is normal.  No changes to therapy.  3.  Hyperlipidemia: Presently on atorvastatin 40 mg.  LDL 51 on 10/18/2018.  4.  Type 2 diabetes mellitus: A1c elevated at 8.9% on 4/29//2020.  This is now being managed by endocrinology.    COVID-19 Education: The signs and symptoms of COVID-19 were discussed with the patient and how to seek care for testing (follow up with PCP or arrange E-visit).  The importance of social distancing was discussed today.  Time:   Today, I have spent 15 minutes with the patient with telehealth technology discussing the above problems.     Medication Adjustments/Labs and Tests Ordered: Current medicines are reviewed at length with the patient today.  Concerns regarding medicines are outlined above.   Tests Ordered: No orders of the defined types were placed in this encounter.   Medication Changes: No orders of the defined types were placed in this encounter.   Disposition:  Follow up in 6 month(s)  Signed, Kate Sable, MD  02/10/2019 9:08 AM    [facility]

## 2019-02-10 NOTE — Telephone Encounter (Signed)
I spoke to patient to RS his appointment to virtual (spoke to [place] first, she said ok to RS this way )

## 2019-02-10 NOTE — Patient Instructions (Signed)

## 2019-02-13 ENCOUNTER — Other Ambulatory Visit: Payer: Self-pay

## 2019-02-13 ENCOUNTER — Ambulatory Visit (INDEPENDENT_AMBULATORY_CARE_PROVIDER_SITE_OTHER): Payer: Worker's Compensation | Admitting: Orthopedic Surgery

## 2019-02-13 DIAGNOSIS — M7711 Lateral epicondylitis, right elbow: Secondary | ICD-10-CM | POA: Diagnosis not present

## 2019-02-13 DIAGNOSIS — M25521 Pain in right elbow: Secondary | ICD-10-CM

## 2019-02-13 DIAGNOSIS — G8929 Other chronic pain: Secondary | ICD-10-CM

## 2019-02-13 DIAGNOSIS — R251 Tremor, unspecified: Secondary | ICD-10-CM

## 2019-02-13 DIAGNOSIS — G5631 Lesion of radial nerve, right upper limb: Secondary | ICD-10-CM | POA: Diagnosis not present

## 2019-02-13 NOTE — Progress Notes (Signed)
Virtual Visit via Telephone Note  I connected with [name] on 02/13/19 at 11:20 AM EDT by telephone and verified that I am speaking with the correct person using two identifiers.  Location: Patient: Home Provider: Office   I discussed the limitations, risks, security and privacy concerns of performing an evaluation and management service by telephone and the availability of in person appointments. I also discussed with the patient that there may be a patient responsible charge related to this service. The patient expressed understanding and agreed to proceed.   History of Present Illness: 58 year old male had a lateral epicondyle release postoperatively had continued pain.  He has a tremor which he has had before surgery as well.  He had a second opinion which recommended that he see a neurologist but no further surgery be done.  Based on the pain that he is having his examination and imaging studies I think he would benefit from a nerve decompression at the supinator muscle in an attempt to relieve his symptoms   Assessment and Plan: Lateral elbow pain right Supraspinatus nerve entrapment radial nerve right Chronic pain right elbow Tremor right arm  Follow Up Instructions:  Patient will call me in a week or 2 after speaking with his attorney    I discussed the assessment and treatment plan with the patient. The patient was provided an opportunity to ask questions and all were answered. The patient agreed with the plan and demonstrated an understanding of the instructions.   The patient was advised to call back or seek an in-person evaluation if the symptoms worsen or if the condition fails to improve as anticipated.  I provided 3 minutes of non-face-to-face time during this encounter.   Arther Abbott, MD

## 2019-03-04 ENCOUNTER — Telehealth: Payer: Self-pay | Admitting: "Endocrinology

## 2019-03-04 NOTE — Telephone Encounter (Signed)
Patient said his night time numbers are running high.  200 or above for evening  126-254 for morning time. He said his feet have been bothering him the last couple weeks.

## 2019-03-04 NOTE — Telephone Encounter (Signed)
Patient is aware of the recommendation

## 2019-03-04 NOTE — Telephone Encounter (Signed)
Advise to increase Toujeo to 110 units qhs.

## 2019-03-04 NOTE — Telephone Encounter (Signed)
Sat 06/13 Am-213 Pm-307  Sun 06/14 Am-224 Pm-314  Mon 06/15 Am-forgot to take Pm-365  Tues 06/16 AM-194 Pm-245  Wed 06/17 AM-138  Please advise of any changes ?

## 2019-03-05 DIAGNOSIS — Z029 Encounter for administrative examinations, unspecified: Secondary | ICD-10-CM

## 2019-03-09 ENCOUNTER — Ambulatory Visit (HOSPITAL_COMMUNITY): Payer: PRIVATE HEALTH INSURANCE | Admitting: Psychiatry

## 2019-03-17 ENCOUNTER — Other Ambulatory Visit: Payer: Self-pay | Admitting: Cardiovascular Disease

## 2019-03-23 ENCOUNTER — Ambulatory Visit (HOSPITAL_COMMUNITY): Payer: PRIVATE HEALTH INSURANCE | Admitting: Psychiatry

## 2019-03-23 ENCOUNTER — Ambulatory Visit (INDEPENDENT_AMBULATORY_CARE_PROVIDER_SITE_OTHER): Payer: PRIVATE HEALTH INSURANCE | Admitting: Internal Medicine

## 2019-03-30 ENCOUNTER — Other Ambulatory Visit: Payer: Self-pay

## 2019-03-30 ENCOUNTER — Encounter (HOSPITAL_COMMUNITY): Payer: Self-pay | Admitting: Psychiatry

## 2019-03-30 ENCOUNTER — Ambulatory Visit (INDEPENDENT_AMBULATORY_CARE_PROVIDER_SITE_OTHER): Payer: PRIVATE HEALTH INSURANCE | Admitting: Psychiatry

## 2019-03-30 DIAGNOSIS — F321 Major depressive disorder, single episode, moderate: Secondary | ICD-10-CM
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
## 2019-03-31 ENCOUNTER — Ambulatory Visit (INDEPENDENT_AMBULATORY_CARE_PROVIDER_SITE_OTHER): Payer: PRIVATE HEALTH INSURANCE | Admitting: Psychiatry

## 2019-03-31 ENCOUNTER — Encounter (HOSPITAL_COMMUNITY): Payer: Self-pay | Admitting: Psychiatry

## 2019-03-31 ENCOUNTER — Other Ambulatory Visit: Payer: Self-pay

## 2019-03-31 DIAGNOSIS — F321 Major depressive disorder, single episode, moderate: Secondary | ICD-10-CM | POA: Diagnosis not present

## 2019-03-31 MED ORDER — TRAZODONE HCL 50 MG PO TABS: 50.0000 mg | ORAL_TABLET | Freq: Every day | ORAL | 2 refills | Status: DC

## 2019-03-31 MED ORDER — SERTRALINE HCL 100 MG PO TABS: 150.0000 mg | ORAL_TABLET | Freq: Every day | ORAL | 2 refills | Status: DC

## 2019-03-31 MED ORDER — ALPRAZOLAM 0.5 MG PO TABS: 0.5000 mg | ORAL_TABLET | Freq: Three times a day (TID) | ORAL | 2 refills | Status: DC | PRN

## 2019-03-31 NOTE — Progress Notes (Signed)
Virtual Visit via Video Note  I connected with Jared Griffin on 03/31/19 at 10:40 AM EDT by a video enabled telemedicine application and verified that I am speaking with the correct person using two identifiers.   I discussed the limitations of evaluation and management by telemedicine and the availability of in person appointments. The patient expressed understanding and agreed to proceed.    I discussed the assessment and treatment plan with the patient. The patient was provided an opportunity to ask questions and all were answered. The patient agreed with the plan and demonstrated an understanding of the instructions.   The patient was advised to call back or seek an in-person evaluation if the symptoms worsen or if the condition fails to improve as anticipated.  I provided 15 minutes of non-face-to-face time during this encounter.   [name], MD  [facility] MD/PA/NP OP Progress Note  03/31/2019 10:42 AM Jared Griffin  MRN:  [id number]  Chief Complaint:  Chief Complaint    Depression; Anxiety; Follow-up     HPI: This patient is a 58 year old married white male lives with his wife and 33 year old son in [place]. 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 zone  The patient was referred by his primary physician, Dr. [name], for further assessment and treatment of anxiety and depression.  The patient states that he is become increasingly depressed and anxious over the last couple of years due to work stress. He stated that he was always under pressure to get more things done than he had time to do. 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 He got to the point of hating going to work. Several years ago he had cardiac problems and had a stent placed. In January of this year  he began developing dizzy spells chest pain headaches stomach aches. It got so bad that he was readmitted for cardiac catheterization but nothing new was found.  The patient was having difficulty sleeping constant worry and anxiety about his job sadness and anger. He was getting irritable with people at home. After talking to his primary care physician at length and decided to take him out on medical retirement. The patient has been on Wellbutrin SR 150 no gram twice a day for several weeks now. He had also been out in the past when his wife was sick. He has not seen any benefit from this but does feel tremendously better since he stopped working. He is also on Xanax 0.5 mg 3 times a day which he thinks has helped to some degree.  Since stopping working he's had a big turnaround. His sleep and energy have improved. 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 He is much less anxious and is no longer having the chest pain dizzy spells or stomachaches. He denies suicidal ideation or psychotic symptoms. He does not use drugs or alcohol  The patient returns after 3 months and is assessed via video telemedicine due to the coronavirus pandemic.  He states that he has not been doing as well lately.  He feels more depressed and worried about things he sees news as well as his family.  His wife's health is actually better.  He has been having some chest pain and was evaluated by GI had  an endoscopy which was normal.  His cardiologist did not see anything different either.  His arm is still hurting and workman's comp has denied further treatment and he has hired a Chief Executive Officer to deal with this.  The reported increased depression and crying spells.  Still is dwelling on his friend who died of cancer last 08/10/23 as well as other family members who have died.  He seems to be dwelling on negative things quite a bit.  He thinks about all this at night and cannot sleep often until 3 AM.  He denies being  suicidal or having any plans of self-harm.  But he has reported increased depression and crying spells.  I suggested that we increase his Zoloft 150 mg, and add trazodone to help with sleep.  He does not think clonazepam is helping his anxiety as well as Xanax so we will switch back.  He has started therapy with [name] which is a good thing Visit Diagnosis:    ICD-10-CM   1. Moderate single current episode of major depressive disorder ([place])  F32.1     Past Psychiatric History: none  Past Medical History:  Past Medical History:  Diagnosis Date  . Anginal pain ([place])   . Anxiety   . 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 COPD (chronic obstructive pulmonary disease) ([place])    very close to being diagnosed  . Coronary artery disease   . Coronary atherosclerosis of native coronary artery    a. 12/09/2013: s/p PCI with 3.0 x 28 Promus DES to mLAD  . Cyst (solitary) of breast 10/03/2017   removed from back  . Depression   . Dyspnea    take inhaler  . Elbow injury 07/25/2017   surgery for torn tendon  . Essential hypertension, benign   . Fatty liver disease, nonalcoholic   . Lateral epicondylitis of right elbow   . Mixed hyperlipidemia 2006  . Obesity   . Osgood-Schlatter's disease of right knee   . Type 2 diabetes mellitus ([place])     Past Surgical History:  Procedure Laterality Date  . BIOPSY  01/21/2019   Procedure: BIOPSY;  Surgeon: [name], MD;  [facility];  Service: Endoscopy;;  gastric bx and gastric polyps  . CARDIAC CATHETERIZATION  2011  . CARDIAC CATHETERIZATION N/A 09/20/2015   Procedure: Left Heart Cath and Coronary Angiography;  Surgeon: [name], MD;  Location: [place] [facility];  Service: Cardiovascular;  Laterality: N/A;  . CHOLECYSTECTOMY  2003  . COLONOSCOPY  06/19/2012   Procedure: COLONOSCOPY;  Surgeon: [name], MD;  [facility];  Service: Endoscopy;  Laterality: N/A;  930  . COLONOSCOPY N/A  10/17/2017   Procedure: COLONOSCOPY;  Surgeon: [name], MD;  [facility];  Service: Endoscopy;  Laterality: N/A;  1225  . ESOPHAGOGASTRODUODENOSCOPY (EGD) WITH PROPOFOL N/A 01/21/2019   Procedure: ESOPHAGOGASTRODUODENOSCOPY (EGD) WITH PROPOFOL;  Surgeon: [name], MD;  [facility];  Service: Endoscopy;  Laterality: N/A;  10:15am  . KNEE ARTHROPLASTY Right 1999  . LEFT HEART CATH AND CORONARY ANGIOGRAPHY N/A 02/26/2018   Procedure: LEFT HEART CATH AND CORONARY ANGIOGRAPHY;  Surgeon: [name], MD;  Location: [place] [facility];  Service: Cardiovascular;  Laterality: N/A;  . LEFT HEART CATHETERIZATION WITH CORONARY ANGIOGRAM N/A 12/09/2013   Procedure: LEFT HEART CATHETERIZATION WITH CORONARY ANGIOGRAM;  Surgeon: [name], MD;  Location: [facility] CATH LAB;  Service: Cardiovascular;  Laterality: N/A;  . Liver biopsy    . POLYPECTOMY  10/17/2017   Procedure: POLYPECTOMY;  Surgeon: [name], MD;  [facility];  Service: Endoscopy;;  ascending colon, splenic flexure,sigmoid x2  . TENNIS ELBOW RELEASE/NIRSCHEL PROCEDURE Right 07/25/2017   Procedure: TENNIS ELBOW RELEASE and debriedment;  Surgeon: [name], MD;  Location: [facility];  Service: Orthopedics;  Laterality: Right;  . TONSILLECTOMY  1970's    Family Psychiatric History: see below  Family History:  Family History  Problem Relation Age of Onset  . Cancer Maternal Aunt   . CVA Maternal Grandmother   . CVA Maternal Grandfather     Social History:  Social History   Socioeconomic History  . Marital status: Married    Spouse name: Not on file  . Number of children: Not on file  . Years of education: Not on file  . Highest education level: Not on file  Occupational History  . Not on file  Social Needs  . Financial resource strain: Not hard at all  . Food insecurity    Worry: Never true    Inability: Never true  . Transportation needs    Medical: No     Non-medical: No  Tobacco Use  . Smoking status: Former Smoker    Packs/day: 0.50    Years: 10.00    Pack years: 5.00    Types: Cigarettes    Start date: 02/02/1973    Quit date: 09/17/1983    Years since quitting: 35.5  . Smokeless tobacco: Former Systems developer    Types: [place] date: 09/17/1983  Substance and Sexual Activity  . Alcohol use: No    Alcohol/week: 0.0 standard drinks  . Drug use: No  . Sexual activity: Yes  Lifestyle  . Physical activity    Days per week: Patient refused    Minutes per session: Patient refused  . Stress: To some extent  Relationships  . Social Herbalist on phone: Patient refused    Gets together: Patient refused    Attends religious service: Patient refused    Active member of club or organization: Patient refused    Attends meetings of clubs or organizations: Patient refused    Relationship status: Patient refused  Other Topics Concern  . 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    Married with 1 child   Right handed    12 th    Very little caffeine    Allergies:  Allergies  Allergen Reactions  . Metoprolol Rash    Metabolic Disorder Labs: Lab Results  Component Value Date   HGBA1C 8.9 (H) 01/14/2019   MPG 211.6 07/12/2018   MPG 197.25 07/23/2017   No results found for: PROLACTIN Lab Results  Component Value Date   CHOL 116 10/18/2018   TRIG 173 (H) 10/18/2018   HDL 30 (L) 10/18/2018   CHOLHDL 3.9 10/18/2018   VLDL UNABLE TO CALCULATE IF TRIGLYCERIDE OVER 400 mg/dL 07/12/2018   LDLCALC 51 10/18/2018   LDLCALC UNABLE TO CALCULATE IF TRIGLYCERIDE OVER 400 mg/dL 07/12/2018   Lab Results  Component Value Date   TSH 0.525 07/12/2018   TSH 0.458 04/13/2017    Therapeutic Level Labs: No results found for: LITHIUM No results found for: VALPROATE No components found for:  CBMZ  Current Medications: Current Outpatient  Medications  Medication Sig Dispense Refill  .  albuterol (PROVENTIL HFA;VENTOLIN HFA) 108 (90 Base) MCG/ACT inhaler Inhale 2 puffs into the lungs every 6 (six) hours as needed for wheezing or shortness of breath. 1 Inhaler 2  . ALPRAZolam (XANAX) 0.5 MG tablet Take 1 tablet (0.5 mg total) by mouth 3 (three) times daily as needed for sleep or anxiety. 90 tablet 2  . aspirin (ASPIR-LOW) 81 MG EC tablet Take 1 tablet (81 mg total) by mouth daily. 30 tablet 12  . atorvastatin (LIPITOR) 40 MG tablet TAKE ONE (1) TABLET BY MOUTH EVERY DAY 90 tablet 3  . cefdinir (OMNICEF) 300 MG capsule Take 1 capsule (300 mg total) by mouth 2 (two) times daily. 20 capsule 0  . diltiazem (CARDIZEM) 30 MG tablet TAKE (1) TABLET BY MOUTH TWICE DAILY. 180 tablet 1  . fluticasone (FLOVENT HFA) 110 MCG/ACT inhaler Inhale 2 puffs into the lungs 2 (two) times a day. 1 Inhaler 5  . fluticasone (FLOVENT HFA) 44 MCG/ACT inhaler Inhale 2 puffs into the lungs 2 (two) times daily. 1 Inhaler 5  . gabapentin (NEURONTIN) 300 MG capsule Take 1 capsule (300 mg total) by mouth 4 (four) times daily. 120 capsule 5  . glipiZIDE (GLUCOTROL XL) 5 MG 24 hr tablet Take 1 tablet (5 mg total) by mouth daily with breakfast. 30 tablet 3  . glucose blood test strip 1 each by Other route 4 (four) times daily. Use as instructed qid. E11.65. One touch Ultra 150 each 5  . Icosapent Ethyl (VASCEPA) 1 g CAPS Take 2 capsules (2 g total) by mouth 2 (two) times a day. 120 capsule 11  . Insulin Glargine, 2 Unit Dial, (TOUJEO MAX SOLOSTAR) 300 UNIT/ML SOPN Inject 100 Units into the skin at bedtime. 4 pen 2  . Lancets MISC 1 each by Does not apply route 4 (four) times daily. 150 each 5  . lidocaine (LIDODERM) 5 % Place 1 patch onto the skin every 12 (twelve) hours. Remove & Discard patch within 12 hours or as directed by MD 30 patch 0  . losartan (COZAAR) 25 MG tablet TAKE (1) TABLET BY MOUTH ONCE DAILY. 90 tablet 1  . meclizine (ANTIVERT) 25 MG tablet Take 1  tablet (25 mg total) by mouth 3 (three) times daily as needed for dizziness. 30 tablet 0  . metFORMIN (GLUCOPHAGE) 500 MG tablet TAKE 2 TABLETS IN THE MORNING, 1 TABLET AT NOON, AND 2 TABLETS IN THEEVENING. 450 tablet 0  . nitroGLYCERIN (NITROSTAT) 0.4 MG SL tablet Place 1 tablet (0.4 mg total) under the tongue every 5 (five) minutes as needed for chest pain. 25 tablet 3  . pantoprazole (PROTONIX) 40 MG tablet Take one daily 90 tablet 1  . ranolazine (RANEXA) 1000 MG SR tablet Take 1 tablet (1,000 mg total) by mouth 2 (two) times daily for 30 days. 60 tablet 0  . sertraline (ZOLOFT) 100 MG tablet Take 1.5 tablets (150 mg total) by mouth daily. 135 tablet 2  . traZODone (DESYREL) 50 MG tablet Take 1 tablet (50 mg total) by mouth at bedtime. 30 tablet 2   No current facility-administered medications for this visit.      Musculoskeletal: Strength & Muscle Tone: within normal limits Gait & Station: normal Patient leans: N/A  Psychiatric Specialty Exam: Review of Systems  Respiratory: Positive for shortness of breath.   Cardiovascular: Positive for chest pain.  Musculoskeletal: Positive for joint pain.  Psychiatric/Behavioral: Positive for depression. The patient is nervous/anxious and has insomnia.   All other systems reviewed and are negative.  There were no vitals taken for this visit.There is no height or weight on file to calculate BMI.  General Appearance: Casual and Fairly Groomed  Eye Contact:  Good  Speech:  Clear and Coherent  Volume:  Normal  Mood:  Depressed  Affect:  Depressed  Thought Process:  Goal Directed  Orientation:  Full (Time, Place, and Person)  Thought Content: Rumination   Suicidal Thoughts:  No  Homicidal Thoughts:  No  Memory:  Immediate;   Good Recent;   Good Remote;   Good  Judgement:  Good  Insight:  Fair  Psychomotor Activity:  Decreased  Concentration:  Concentration: Good and Attention Span: Good  Recall:  Good  Fund of Knowledge: Good   Language: Good  Akathisia:  No  Handed:  Right  AIMS (if indicated): not done  Assets:  Communication Skills Desire for Improvement Resilience Social Support Talents/Skills  ADL's:  Intact  Cognition: WNL  Sleep:  Poor   Screenings: GAD-7     Office Visit from 08/05/2018 in [place]  Total GAD-7 Score  5    PHQ2-9     Nutrition from 08/06/2018 in [facility] Office Visit from 08/05/2018 in [place] Visit from 07/24/2018 in [facility] Office Visit from 12/02/2017 in [place] Visit from 08/02/2017 in [place]  PHQ-2 Total Score  0  1  0  0  0  PHQ-9 Total Score  -  3  -  1  -       Assessment and Plan: This patient is a 58 year old man with a history of significant depression and anxiety.  He seems to be more overwhelmed with "recently.  He will increase Zoloft 150 mg daily for depression and start trazodone 50 mg at bedtime for sleep.  He will discontinue clonazepam and go back to Xanax 0.5 mg 3 times daily as needed for anxiety.  He will return to see me in 4 weeks   [name], MD 03/31/2019, 10:42 AM

## 2019-04-15 ENCOUNTER — Other Ambulatory Visit: Payer: Self-pay | Admitting: "Endocrinology

## 2019-04-16 LAB — COMPREHENSIVE METABOLIC PANEL
ALT: 49 IU/L — ABNORMAL HIGH (ref 0–44)
AST: 33 IU/L (ref 0–40)
Albumin/Globulin Ratio: 1.9 (ref 1.2–2.2)
Albumin: 4.5 g/dL (ref 3.8–4.9)
Alkaline Phosphatase: 51 IU/L (ref 39–117)
BUN/Creatinine Ratio: 14 (ref 9–20)
BUN: 13 mg/dL (ref 6–24)
Bilirubin Total: 0.4 mg/dL (ref 0.0–1.2)
CO2: 20 mmol/L (ref 20–29)
Calcium: 9.4 mg/dL (ref 8.7–10.2)
Chloride: 100 mmol/L (ref 96–106)
Creatinine, Ser: 0.9 mg/dL (ref 0.76–1.27)
GFR calc Af Amer: 109 mL/min/{1.73_m2} (ref >59)
GFR calc non Af Amer: 94 mL/min/{1.73_m2} (ref >59)
Globulin, Total: 2.4 g/dL (ref 1.5–4.5)
Glucose: 204 mg/dL — ABNORMAL HIGH (ref 65–99)
Potassium: 3.9 mmol/L (ref 3.5–5.2)
Sodium: 141 mmol/L (ref 134–144)
Total Protein: 6.9 g/dL (ref 6.0–8.5)

## 2019-04-16 LAB — HGB A1C W/O EAG: Hgb A1c MFr Bld: 8.9 % — ABNORMAL HIGH (ref 4.8–5.6)

## 2019-04-16 LAB — SPECIMEN STATUS REPORT

## 2019-04-20 ENCOUNTER — Ambulatory Visit (INDEPENDENT_AMBULATORY_CARE_PROVIDER_SITE_OTHER): Payer: PRIVATE HEALTH INSURANCE | Admitting: Psychiatry

## 2019-04-20 ENCOUNTER — Other Ambulatory Visit: Payer: Self-pay

## 2019-04-20 DIAGNOSIS — F321 Major depressive disorder, single episode, moderate: Secondary | ICD-10-CM

## 2019-04-20 NOTE — Progress Notes (Signed)
Virtual Visit via Video Note  I connected with Jared Griffin on 04/20/19 at 11:10 AM  by a video enabled telemedicine application and verified that I am speaking with the correct person using two identifiers.   I discussed the limitations of evaluation and management by telemedicine and the availability of in person appointments. The patient expressed understanding and agreed to proceed.  I provided 50 minutes of non-face-to-face time during this encounter.   Alonza Smoker, LCSW    THERAPIST PROGRESS NOTE  Session Time: Monday 04/20/2019 11:10 AM -12:00 PM  Participation Level: Active  Behavioral Response: CasualAlertAnxious and Depressed  Type of Therapy: Individual Therapy  Treatment Goals addressed: Establish rapport, learn and implement behavioral strategies to overcome depression and manage anxiety  Interventions: Supportive/CBT  Summary: Jared Griffin is a 58 y.o. male who is referred for services by psychiatrist Dr. Harrington Challenger due to patient experiencing symptoms of anxiety and depression.  He reports no psychiatric hospitalizations and denies any previous involvement in outpatient therapy. 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 He has been out on medical leave since that time. He has multiple health issues and has a heart stent. He reports staying anxious and worrying all the time especially about finances as he is on fixed income. He also reports difficulty falling and staying asleep, irritability, memory difficulty, poor concentration, crying spells, and isolative behaviors.  Patient last was seen via virtual visit about 3 weeks ago.  He reports improved sleep pattern since taking medication as prescribed by Dr. Harrington Challenger.  He reports trying to  exercise by walking but has experienced pain in his right knee and hip.  He continues to experience symptoms of depression and anxiety including poor motivation, social withdrawal, decreased interest in activities, and excessive worry.  He says he has only left his home about twice since last session.  He used to enjoy fishing and hunting per his report.  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  He is working with an attorney to try to manage this but recently was informed his attorney is retiring but did refer patient to another attorney.  Patient continues to have thoughts about his job as a Tax adviser and reports sometimes having thoughts and flashbacks of very difficult cases. He reports pattern of internalizing his feelings.    Suicidal/Homicidal: Nowithout intent/plan  Therapist Response: Established rapport, reviewed symptoms, discussed stressors, facilitated expression of thoughts and feelings, validated feelings, developed treatment plan, obtained patient's permission to initial plan for patient as this is a virtual visit, oriented patient to CBT, discussed rationale for and assisted patient practice deep breathing to manage anxiety and stress, discussed the stress response and using deep breathing to trigger relaxation response, assigned patient to practice deep breathing 5 to 10 minutes 2 times per day  Plan: Return again in 2 weeks.  Diagnosis: Axis I: MDD    Axis II: No diagnosis    Alonza Smoker, LCSW 04/20/2019

## 2019-04-21 ENCOUNTER — Ambulatory Visit (INDEPENDENT_AMBULATORY_CARE_PROVIDER_SITE_OTHER): Payer: PRIVATE HEALTH INSURANCE | Admitting: "Endocrinology

## 2019-04-21 ENCOUNTER — Encounter: Payer: Self-pay | Admitting: "Endocrinology

## 2019-04-21 ENCOUNTER — Other Ambulatory Visit: Payer: Self-pay

## 2019-04-21 VITALS — BP 119/81 | HR 83 | Temp 97.7°F | Ht 74.0 in | Wt 260.4 lb

## 2019-04-21 DIAGNOSIS — E782 Mixed hyperlipidemia: Secondary | ICD-10-CM

## 2019-04-21 DIAGNOSIS — E1159 Type 2 diabetes mellitus with other circulatory complications: Secondary | ICD-10-CM

## 2019-04-21 DIAGNOSIS — I10 Essential (primary) hypertension: Secondary | ICD-10-CM

## 2019-04-21 MED ORDER — GLIPIZIDE ER 5 MG PO TB24: 10.0000 mg | ORAL_TABLET | Freq: Every day | ORAL | 0 refills | Status: DC

## 2019-04-21 MED ORDER — TOUJEO MAX SOLOSTAR 300 UNIT/ML ~~LOC~~ SOPN: 120.0000 [IU] | PEN_INJECTOR | Freq: Every day | SUBCUTANEOUS | 2 refills | Status: DC

## 2019-04-21 NOTE — Progress Notes (Signed)
Endocrinology follow-up Note       04/21/2019, 8:43 AM   Subjective:    Patient ID: Jared Griffin, male    DOB: 1960-10-13.  Reason Helzer is being seen in follow-up  for management of currently uncontrolled symptomatic type 2 diabetes, hyperlipidemia, hypertension. PMD:  Mikey Kirschner, MD.   Past Medical History:  Diagnosis Date  . Anginal pain ([place])   . Anxiety   . 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 COPD (chronic obstructive pulmonary disease) ([place])    very close to being diagnosed  . Coronary artery disease   . Coronary atherosclerosis of native coronary artery    a. 12/09/2013: s/p PCI with 3.0 x 28 Promus DES to mLAD  . Cyst (solitary) of breast 10/03/2017   removed from back  . Depression   . Dyspnea    take inhaler  . Elbow injury 07/25/2017   surgery for torn tendon  . Essential hypertension, benign   . Fatty liver disease, nonalcoholic   . Lateral epicondylitis of right elbow   . Mixed hyperlipidemia 2006  . Obesity   . Osgood-Schlatter's disease of right knee   . Type 2 diabetes mellitus ([place])    Past Surgical History:  Procedure Laterality Date  . BIOPSY  01/21/2019   Procedure: BIOPSY;  Surgeon: [name], MD;  [facility];  Service: Endoscopy;;  gastric bx and gastric polyps  . CARDIAC CATHETERIZATION  2011  . CARDIAC CATHETERIZATION N/A 09/20/2015   Procedure: Left Heart Cath and Coronary Angiography;  Surgeon: [name], MD;  Location: [facility];  Service: Cardiovascular;  Laterality: N/A;  . CHOLECYSTECTOMY  2003  . COLONOSCOPY  06/19/2012   Procedure: COLONOSCOPY;  Surgeon: [name], MD;  [facility]: [facility];  Service: Endoscopy;  Laterality: N/A;  930  . COLONOSCOPY N/A 10/17/2017   Procedure: COLONOSCOPY;  Surgeon: [name], MD;  [facility];  Service: Endoscopy;   Laterality: N/A;  1225  . ESOPHAGOGASTRODUODENOSCOPY (EGD) WITH PROPOFOL N/A 01/21/2019   Procedure: ESOPHAGOGASTRODUODENOSCOPY (EGD) WITH PROPOFOL;  Surgeon: [name], MD;  [facility];  Service: Endoscopy;  Laterality: N/A;  10:15am  . KNEE ARTHROPLASTY Right 1999  . LEFT HEART CATH AND CORONARY ANGIOGRAPHY N/A 02/26/2018   Procedure: LEFT HEART CATH AND CORONARY ANGIOGRAPHY;  Surgeon: [name], MD;  Location: [place] [facility];  Service: Cardiovascular;  Laterality: N/A;  . LEFT HEART CATHETERIZATION WITH CORONARY ANGIOGRAM N/A 12/09/2013   Procedure: LEFT HEART CATHETERIZATION WITH CORONARY ANGIOGRAM;  Surgeon: [name], MD;  Location: [facility];  Service: Cardiovascular;  Laterality: N/A;  . Liver biopsy    . POLYPECTOMY  10/17/2017   Procedure: POLYPECTOMY;  Surgeon: [name], MD;  [facility];  Service: Endoscopy;;  ascending colon, splenic flexure,sigmoid x2  . TENNIS ELBOW RELEASE/NIRSCHEL PROCEDURE Right 07/25/2017   Procedure: TENNIS ELBOW RELEASE and debriedment;  Surgeon: [name], MD;  Location: [facility];  Service: Orthopedics;  Laterality: Right;  . TONSILLECTOMY  1970's   Social History  Socioeconomic History  . Marital status: Married    Spouse name: Not on file  . Number of children: Not on file  . Years of education: Not on file  . Highest education level: Not on file  Occupational History  . Not on file  Social Needs  . Financial resource strain: Not hard at all  . Food insecurity    Worry: Never true    Inability: Never true  . Transportation needs    Medical: No    Non-medical: No  Tobacco Use  . Smoking status: Former Smoker    Packs/day: 0.50    Years: 10.00    Pack years: 5.00    Types: Cigarettes    Start date: 02/02/1973    Quit date: 09/17/1983    Years since quitting: 35.6  . Smokeless tobacco: Former Systems developer    Types: [place] date: 09/17/1983  Substance and Sexual Activity   . Alcohol use: No    Alcohol/week: 0.0 standard drinks  . Drug use: No  . Sexual activity: Yes  Lifestyle  . Physical activity    Days per week: Patient refused    Minutes per session: Patient refused  . Stress: To some extent  Relationships  . Social Herbalist on phone: Patient refused    Gets together: Patient refused    Attends religious service: Patient refused    Active member of club or organization: Patient refused    Attends meetings of clubs or organizations: Patient refused    Relationship status: Patient refused  Other Topics Concern  . 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    Married with 1 child   Right handed    12 th    Very little caffeine   Outpatient Encounter Medications as of 04/21/2019  Medication Sig  . albuterol (PROVENTIL HFA;VENTOLIN HFA) 108 (90 Base) MCG/ACT inhaler Inhale 2 puffs into the lungs every 6 (six) hours as needed for wheezing or shortness of breath.  . ALPRAZolam (XANAX) 0.5 MG tablet Take 1 tablet (0.5 mg total) by mouth 3 (three) times daily as needed for sleep or anxiety.  Marland Kitchen aspirin (ASPIR-LOW) 81 MG EC tablet Take 1 tablet (81 mg total) by mouth daily.  Marland Kitchen atorvastatin (LIPITOR) 40 MG tablet TAKE ONE (1) TABLET BY MOUTH EVERY DAY  . diltiazem (CARDIZEM) 30 MG tablet TAKE (1) TABLET BY MOUTH TWICE DAILY.  . fluticasone (FLOVENT HFA) 110 MCG/ACT inhaler Inhale 2 puffs into the lungs 2 (two) times a day.  . gabapentin (NEURONTIN) 300 MG capsule Take 1 capsule (300 mg total) by mouth 4 (four) times daily.  Marland Kitchen glipiZIDE (GLUCOTROL XL) 5 MG 24 hr tablet Take 2 tablets (10 mg total) by mouth daily with breakfast.  . glucose blood test strip 1 each by Other route 4 (four) times daily. Use as instructed qid. E11.65. One touch Ultra  . Icosapent Ethyl (VASCEPA) 1 g CAPS Take 2 capsules (2 g total) by mouth 2 (two) times a day.  . Insulin Glargine, 2  Unit Dial, (TOUJEO MAX SOLOSTAR) 300 UNIT/ML SOPN Inject 120 Units into the skin at bedtime.  . Lancets MISC 1 each by Does not apply route 4 (four) times daily.  Marland Kitchen lidocaine (LIDODERM) 5 % Place 1 patch onto the skin every 12 (twelve) hours. Remove & Discard patch within 12 hours or as directed by MD  .  losartan (COZAAR) 25 MG tablet TAKE (1) TABLET BY MOUTH ONCE DAILY.  . meclizine (ANTIVERT) 25 MG tablet Take 1 tablet (25 mg total) by mouth 3 (three) times daily as needed for dizziness.  . metFORMIN (GLUCOPHAGE) 500 MG tablet TAKE 2 TABLETS IN THE MORNING, 1 TABLET AT NOON, AND 2 TABLETS IN THEEVENING.  . nitroGLYCERIN (NITROSTAT) 0.4 MG SL tablet Place 1 tablet (0.4 mg total) under the tongue every 5 (five) minutes as needed for chest pain.  . pantoprazole (PROTONIX) 40 MG tablet Take one daily  . ranolazine (RANEXA) 1000 MG SR tablet Take 1 tablet (1,000 mg total) by mouth 2 (two) times daily for 30 days.  Marland Kitchen sertraline (ZOLOFT) 100 MG tablet Take 1.5 tablets (150 mg total) by mouth daily.  . traZODone (DESYREL) 50 MG tablet Take 1 tablet (50 mg total) by mouth at bedtime.  . [DISCONTINUED] cefdinir (OMNICEF) 300 MG capsule Take 1 capsule (300 mg total) by mouth 2 (two) times daily. (Patient not taking: Reported on 04/21/2019)  . [DISCONTINUED] fluticasone (FLOVENT HFA) 44 MCG/ACT inhaler Inhale 2 puffs into the lungs 2 (two) times daily. (Patient not taking: Reported on 04/21/2019)  . [DISCONTINUED] glipiZIDE (GLUCOTROL XL) 5 MG 24 hr tablet TAKE (1) TABLET BY MOUTH ONCE DAILY WITH BREAKFAST.  . [DISCONTINUED] TOUJEO MAX SOLOSTAR 300 UNIT/ML SOPN INJECT 100 UNITS INTO THE SKIN AT BEDTIME. (Patient taking differently: Inject 110 Units into the skin at bedtime. )   No facility-administered encounter medications on file as of 04/21/2019.     ALLERGIES: Allergies  Allergen Reactions  . Metoprolol Rash    VACCINATION STATUS: Immunization History  Administered Date(s) Administered  .  Influenza,inj,Quad PF,6+ Mos 07/11/2016, 08/02/2017, 08/05/2018  . Influenza-Unspecified 06/18/2013, 06/17/2014, 07/06/2015  . Td 01/28/2014    Diabetes He presents for his follow-up diabetic visit. He has type 2 diabetes mellitus. Onset time: He was diagnosed at approximate age of 36 years. His disease course has been stable. There are no hypoglycemic associated symptoms. Pertinent negatives for hypoglycemia include no confusion, headaches, pallor or seizures. Associated symptoms include polydipsia and polyuria. Pertinent negatives for diabetes include no chest pain, no fatigue, no polyphagia and no weakness. There are no hypoglycemic complications. Symptoms are stable. Diabetic complications include heart disease. Risk factors for coronary artery disease include diabetes mellitus, dyslipidemia, hypertension, male sex, obesity, sedentary lifestyle and tobacco exposure. Current diabetic treatment includes oral agent (dual therapy) (He is currently on Invokana 300 mg p.o. daily, metformin 500 mg p.o. twice daily, glipizide 5 mg p.o. twice daily.). His weight is increasing steadily. He is following a generally unhealthy diet. When asked about meal planning, he reported none. He has not had a previous visit with a dietitian. He rarely participates in exercise. His home blood glucose trend is decreasing steadily. His breakfast blood glucose range is generally 140-180 mg/dl. His bedtime blood glucose range is generally >200 mg/dl. His overall blood glucose range is >200 mg/dl. An ACE inhibitor/angiotensin II receptor blocker is being taken. Eye exam is current.  Hyperlipidemia This is a chronic problem. The problem is uncontrolled (He has severe hypertriglyceridemia at 1157.). Exacerbating diseases include diabetes and obesity. Pertinent negatives include no chest pain, myalgias or shortness of breath. Risk factors for coronary artery disease include diabetes mellitus, dyslipidemia, hypertension, male sex,  obesity and a sedentary lifestyle.  Hypertension This is a chronic problem. The current episode started more than 1 year ago. The problem is controlled. Pertinent negatives include no chest pain, headaches, neck pain,  palpitations or shortness of breath. Risk factors for coronary artery disease include diabetes mellitus, dyslipidemia, male gender, obesity, sedentary lifestyle and smoking/tobacco exposure. Hypertensive end-organ damage includes CAD/MI.    Review of Systems  Constitutional: Negative for chills, fatigue, fever and unexpected weight change.  HENT: Negative for dental problem, mouth sores and trouble swallowing.   Eyes: Negative for visual disturbance.  Respiratory: Negative for cough, choking, chest tightness, shortness of breath and wheezing.   Cardiovascular: Negative for chest pain, palpitations and leg swelling.  Gastrointestinal: Negative for abdominal distention, abdominal pain, constipation, diarrhea, nausea and vomiting.  Endocrine: Positive for polydipsia and polyuria. Negative for polyphagia.  Genitourinary: Negative for dysuria, flank pain, hematuria and urgency.  Musculoskeletal: Negative for back pain, gait problem, myalgias and neck pain.  Skin: Negative for pallor, rash and wound.  Neurological: Negative for seizures, syncope, weakness, numbness and headaches.  Psychiatric/Behavioral: Negative for confusion and dysphoric mood.    Objective:    BP 119/81 (BP Location: Left Arm, Patient Position: Sitting, Cuff Size: Normal)   Pulse 83   Temp 97.7 F (36.5 C) (Oral)   Ht 6' 2"  (1.88 m)   Wt 260 lb 6.4 oz (118.1 kg)   SpO2 96%   BMI 33.43 kg/m   Wt Readings from Last 3 Encounters:  04/21/19 260 lb 6.4 oz (118.1 kg)  02/10/19 250 lb (113.4 kg)  01/21/19 254 lb 13.6 oz (115.6 kg)     Physical Exam Constitutional:      General: He is not in acute distress.    Appearance: He is well-developed.  HENT:     Head: Normocephalic and atraumatic.  Neck:      Musculoskeletal: Normal range of motion and neck supple.     Thyroid: No thyromegaly.     Trachea: No tracheal deviation.  Cardiovascular:     Rate and Rhythm: Normal rate.     Pulses:          Dorsalis pedis pulses are 1+ on the right side and 1+ on the left side.       Posterior tibial pulses are 1+ on the right side and 1+ on the left side.     Heart sounds: Normal heart sounds, S1 normal and S2 normal. No murmur. No gallop.   Pulmonary:     Effort: No respiratory distress.     Breath sounds: Normal breath sounds. No wheezing.  Abdominal:     General: Bowel sounds are normal. There is no distension.     Palpations: Abdomen is soft.     Tenderness: There is no abdominal tenderness. There is no guarding.  Musculoskeletal:     Right shoulder: He exhibits no swelling and no deformity.  Skin:    General: Skin is warm and dry.     Findings: No rash.     Nails: There is no clubbing.   Neurological:     Mental Status: He is alert and oriented to person, place, and time.     Cranial Nerves: No cranial nerve deficit.     Sensory: No sensory deficit.     Gait: Gait normal.     Deep Tendon Reflexes: Reflexes are normal and symmetric.  Psychiatric:        Speech: Speech normal.        Behavior: Behavior is cooperative.        Judgment: Judgment normal.    CMP     Component Value Date/Time   NA 141 04/15/2019 0804   K 3.9  04/15/2019 0804   CL 100 04/15/2019 0804   CO2 20 04/15/2019 0804   GLUCOSE 204 (H) 04/15/2019 0804   GLUCOSE 298 (H) 01/04/2019 1016   BUN 13 04/15/2019 0804   CREATININE 0.90 04/15/2019 0804   CREATININE 0.96 12/07/2013 0950   CALCIUM 9.4 04/15/2019 0804   PROT 6.9 04/15/2019 0804   ALBUMIN 4.5 04/15/2019 0804   AST 33 04/15/2019 0804   ALT 49 (H) 04/15/2019 0804   ALKPHOS 51 04/15/2019 0804   BILITOT 0.4 04/15/2019 0804   GFRNONAA 94 04/15/2019 0804   GFRAA 109 04/15/2019 0804     Diabetic Labs (most recent): Lab Results  Component Value Date    HGBA1C 8.9 (H) 04/15/2019   HGBA1C 8.9 (H) 01/14/2019   HGBA1C 9.0 (H) 10/18/2018     Lipid Panel ( most recent) Lipid Panel     Component Value Date/Time   CHOL 116 10/18/2018 0903   TRIG 173 (H) 10/18/2018 0903   HDL 30 (L) 10/18/2018 0903   CHOLHDL 3.9 10/18/2018 0903   CHOLHDL 6.4 07/12/2018 0713   VLDL UNABLE TO CALCULATE IF TRIGLYCERIDE OVER 400 mg/dL 07/12/2018 0713   LDLCALC 51 10/18/2018 0903      Lab Results  Component Value Date   TSH 0.525 07/12/2018   TSH 0.458 04/13/2017   TSH 0.493 02/05/2014      Assessment & Plan:   1. DM type 2 causing vascular disease ([place])  - [name] has currently uncontrolled symptomatic type 2 DM since 58 years of age. -He is returning with improving average fasting blood glucose, his previsit labs showing A1c of 8.9%, progressively improving.   -his diabetes is complicated by coronary artery disease, obesity/sedentary life, history of smoking and he remains at a high risk for more acute and chronic complications which include CAD, CVA, CKD, retinopathy, and neuropathy. These are all discussed in detail with him.  - I have counseled him on diet management and weight loss, by adopting a carbohydrate restricted/protein rich diet.  - he  admits there is a room for improvement in his diet and drink choices. -  Suggestion is made for him to avoid simple carbohydrates  from his diet including Cakes, Sweet Desserts / Pastries, Ice Cream, Soda (diet and regular), Sweet Tea, Candies, Chips, Cookies, Sweet Pastries,  Store Bought Juices, Alcohol in Excess of  1-2 drinks a day, Artificial Sweeteners, Coffee Creamer, and "Sugar-free" Products. This will help patient to have stable blood glucose profile and potentially avoid unintended weight gain.  - I encouraged him to switch to  unprocessed or minimally processed complex starch and increased protein intake (animal or plant source), fruits, and vegetables.  - he is advised to stick  to a routine mealtimes to eat 3 meals  a day and avoid unnecessary snacks ( to snack only to correct hypoglycemia).   - he has been scheduled with Jearld Fenton, RDN, CDE for individualized diabetes education.  - I have approached him with the following individualized plan to manage diabetes and patient agrees:   -He will continue to benefit from basal insulin at a higher dose. -I discussed and increase his Toujeo max to 120 units subcutaneously nightly.   He is advised to continue monitoring blood glucose  2 times a day-before breakfast  and at bedtime.   - he is encouraged to call clinic for blood glucose levels less than 70 or above 200 mg /dl. -He continues to tolerate metformin, advised to continue metformin 1000 mg p.o.  twice daily, therapeutically suitable for patient . -He is advised to increase his glipizide to 10 mg XL p.o. daily at breakfast.    -Due to severe hypertriglyceridemia,  he is not a suitable candidate for incretin therapy for now-insulin treatment would also help.   - Patient specific target  A1c;  LDL, HDL, Triglycerides, and  Waist Circumference were discussed in detail.  2) BP/HTN: His blood pressure is controlled to target.   he is advised to continue his current medications including losartan 25 mg p.o. daily with breakfast .  3) Lipids/HPL:   Review of his recent lipid panel showed unknown LDL due to severe hypertriglyceridemia of 1157+. -He is advised to continue atorvastatin 40 mg p.o. nightly,  he is also on Vascepa 2 g p.o. twice daily.  He is advised to be consistent on his medications.    4)  Weight/Diet:  Body mass index is 33.43 kg/m.  - clearly complicating his diabetes care.  I discussed with him the fact that loss of 5 - 10% of his  current body weight will have the most impact on his diabetes management.  CDE Consult will be initiated . Exercise, and detailed carbohydrates information provided  -  detailed on discharge instructions.  5) Chronic  Care/Health Maintenance:  -he  is on ACEI/ARB and Statin medications and  is encouraged to initiate and continue to follow up with Ophthalmology, Dentist,  Podiatrist at least yearly or according to recommendations, and advised to   stay away from smoking. I have recommended yearly flu vaccine and pneumonia vaccine at least every 5 years; moderate intensity exercise for up to 150 minutes weekly; and  sleep for at least 7 hours a day.  - I advised patient to maintain close follow up with Mikey Kirschner, MD for primary care needs.  - Time spent with the patient: 25 min, of which >50% was spent in reviewing his blood glucose logs , discussing his hypoglycemia and hyperglycemia episodes, reviewing his current and  previous labs / studies and medications  doses and developing a plan to avoid hypoglycemia and hyperglycemia. Please refer to Patient Instructions for Blood Glucose Monitoring and Insulin/Medications Dosing Guide"  in media tab for additional information. Please  also refer to " Patient Self Inventory" in the Media  tab for reviewed elements of pertinent patient history.  [name] participated in the discussions, expressed understanding, and voiced agreement with the above plans.  All questions were answered to his satisfaction. he is encouraged to contact clinic should he have any questions or concerns prior to his return visit.   Follow up plan: - Return in about 4 months (around 08/21/2019) for Follow up with Pre-visit Labs, Meter, and Logs.  Glade Lloyd, MD [facility] [address] [place], [place] [address] Phone: [phone]  Fax: [phone]    04/21/2019, 8:43 AM  This note was partially dictated with voice recognition software. Similar sounding words can be transcribed inadequately or may not  be corrected upon review.

## 2019-04-21 NOTE — Patient Instructions (Signed)

## 2019-04-24 ENCOUNTER — Other Ambulatory Visit: Payer: Self-pay | Admitting: "Endocrinology

## 2019-04-27 ENCOUNTER — Other Ambulatory Visit: Payer: Self-pay

## 2019-04-27 ENCOUNTER — Encounter: Payer: Self-pay | Admitting: Orthopedic Surgery

## 2019-04-27 ENCOUNTER — Ambulatory Visit (INDEPENDENT_AMBULATORY_CARE_PROVIDER_SITE_OTHER): Payer: PRIVATE HEALTH INSURANCE | Admitting: Orthopedic Surgery

## 2019-04-27 VITALS — BP 128/87 | HR 94 | Ht 74.0 in | Wt 264.0 lb

## 2019-04-27 DIAGNOSIS — G8929 Other chronic pain: Secondary | ICD-10-CM | POA: Diagnosis not present

## 2019-04-27 DIAGNOSIS — M5441 Lumbago with sciatica, right side: Secondary | ICD-10-CM

## 2019-04-27 MED ORDER — METHOCARBAMOL 500 MG PO TABS: 500.0000 mg | ORAL_TABLET | Freq: Three times a day (TID) | ORAL | 1 refills | Status: DC

## 2019-04-27 MED ORDER — DICLOFENAC POTASSIUM 50 MG PO TABS: 50.0000 mg | ORAL_TABLET | Freq: Two times a day (BID) | ORAL | 3 refills | Status: DC

## 2019-04-27 NOTE — Progress Notes (Signed)
Jared Griffin  04/27/2019  HISTORY SECTION :  Chief Complaint  Patient presents with  . Hip Pain    right lateral hip painful, popping    HPI The patient presents for evaluation of right hip pain right lower back pain started 3 weeks ago now is associated with pain and popping right leg radiating right knee Location right hip and lower back Duration 3 weeks Quality dull ache Severity moderate Associated with back pain radiating to the knee  Review of Systems  Musculoskeletal: Positive for neck pain.  Psychiatric/Behavioral: Positive for depression.     Past Medical History:  Diagnosis Date  . Anginal pain ([place])   . Anxiety   . 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 COPD (chronic obstructive pulmonary disease) ([place])    very close to being diagnosed  . Coronary artery disease   . Coronary atherosclerosis of native coronary artery    a. 12/09/2013: s/p PCI with 3.0 x 28 Promus DES to mLAD  . Cyst (solitary) of breast 10/03/2017   removed from back  . Depression   . Dyspnea    take inhaler  . Elbow injury 07/25/2017   surgery for torn tendon  . Essential hypertension, benign   . Fatty liver disease, nonalcoholic   . Lateral epicondylitis of right elbow   . Mixed hyperlipidemia 2006  . Obesity   . Osgood-Schlatter's disease of right knee   . Type 2 diabetes mellitus ([place])     Past Surgical History:  Procedure Laterality Date  . BIOPSY  01/21/2019   Procedure: BIOPSY;  Surgeon: [name], MD;  [facility];  Service: Endoscopy;;  gastric bx and gastric polyps  . CARDIAC CATHETERIZATION  2011  . CARDIAC CATHETERIZATION N/A 09/20/2015   Procedure: Left Heart Cath and Coronary Angiography;  Surgeon: [name], MD;  Location: [facility];  Service: Cardiovascular;  Laterality: N/A;  . CHOLECYSTECTOMY  2003  . COLONOSCOPY  06/19/2012   Procedure: COLONOSCOPY;  Surgeon: [name], MD;  [facility];   Service: Endoscopy;  Laterality: N/A;  930  . COLONOSCOPY N/A 10/17/2017   Procedure: COLONOSCOPY;  Surgeon: [name], MD;  [facility];  Service: Endoscopy;  Laterality: N/A;  1225  . ESOPHAGOGASTRODUODENOSCOPY (EGD) WITH PROPOFOL N/A 01/21/2019   Procedure: ESOPHAGOGASTRODUODENOSCOPY (EGD) WITH PROPOFOL;  Surgeon: [name], MD;  [facility];  Service: Endoscopy;  Laterality: N/A;  10:15am  . KNEE ARTHROPLASTY Right 1999  . LEFT HEART CATH AND CORONARY ANGIOGRAPHY N/A 02/26/2018   Procedure: LEFT HEART CATH AND CORONARY ANGIOGRAPHY;  Surgeon: [name], MD;  Location: [facility];  Service: Cardiovascular;  Laterality: N/A;  . LEFT HEART CATHETERIZATION WITH CORONARY ANGIOGRAM N/A 12/09/2013   Procedure: LEFT HEART CATHETERIZATION WITH CORONARY ANGIOGRAM;  Surgeon: [name], MD;  Location: [facility];  Service: Cardiovascular;  Laterality: N/A;  . Liver biopsy    . POLYPECTOMY  10/17/2017   Procedure: POLYPECTOMY;  Surgeon: [name], MD;  [facility];  Service: Endoscopy;;  ascending colon, splenic flexure,sigmoid x2  . TENNIS ELBOW RELEASE/NIRSCHEL PROCEDURE Right 07/25/2017   Procedure: TENNIS ELBOW RELEASE and debriedment;  Surgeon: [name], MD;  Location: [facility];  Service: Orthopedics;  Laterality: Right;  . TONSILLECTOMY  1970's     Allergies  Allergen Reactions  . Metoprolol Rash     Current Outpatient Medications:  .  albuterol (PROVENTIL HFA;VENTOLIN HFA) 108 (90 Base) MCG/ACT inhaler, Inhale 2 puffs into the lungs every 6 (six) hours as needed for wheezing or shortness of breath., Disp: 1 Inhaler, Rfl: 2 .  ALPRAZolam (XANAX) 0.5 MG tablet, Take 1 tablet (0.5 mg total) by mouth 3 (three) times daily as needed for sleep or anxiety., Disp: 90 tablet, Rfl: 2 .  aspirin (ASPIR-LOW) 81 MG EC tablet, Take 1 tablet (81 mg total) by mouth daily., Disp: 30 tablet, Rfl: 12 .  atorvastatin  (LIPITOR) 40 MG tablet, TAKE ONE (1) TABLET BY MOUTH EVERY DAY, Disp: 90 tablet, Rfl: 3 .  diltiazem (CARDIZEM) 30 MG tablet, TAKE (1) TABLET BY MOUTH TWICE DAILY., Disp: 180 tablet, Rfl: 1 .  fluticasone (FLOVENT HFA) 110 MCG/ACT inhaler, Inhale 2 puffs into the lungs 2 (two) times a day., Disp: 1 Inhaler, Rfl: 5 .  gabapentin (NEURONTIN) 300 MG capsule, Take 1 capsule (300 mg total) by mouth 4 (four) times daily., Disp: 120 capsule, Rfl: 5 .  glipiZIDE (GLUCOTROL XL) 5 MG 24 hr tablet, Take 2 tablets (10 mg total) by mouth daily with breakfast., Disp: 180 tablet, Rfl: 0 .  Icosapent Ethyl (VASCEPA) 1 g CAPS, Take 2 capsules (2 g total) by mouth 2 (two) times a day., Disp: 120 capsule, Rfl: 11 .  Insulin Glargine, 2 Unit Dial, (TOUJEO MAX SOLOSTAR) 300 UNIT/ML SOPN, Inject 120 Units into the skin at bedtime., Disp: 6 pen, Rfl: 2 .  Lancets MISC, 1 each by Does not apply route 4 (four) times daily., Disp: 150 each, Rfl: 5 .  lidocaine (LIDODERM) 5 %, Place 1 patch onto the skin every 12 (twelve) hours. Remove & Discard patch within 12 hours or as directed by MD, Disp: 30 patch, Rfl: 0 .  losartan (COZAAR) 25 MG tablet, TAKE (1) TABLET BY MOUTH ONCE DAILY., Disp: 90 tablet, Rfl: 1 .  meclizine (ANTIVERT) 25 MG tablet, Take 1 tablet (25 mg total) by mouth 3 (three) times daily as needed for dizziness., Disp: 30 tablet, Rfl: 0 .  metFORMIN (GLUCOPHAGE) 500 MG tablet, TAKE 2 TABLETS IN THE MORNING, 1 TABLET AT NOON, AND 2 TABLETS IN THEEVENING., Disp: 450 tablet, Rfl: 0 .  nitroGLYCERIN (NITROSTAT) 0.4 MG SL tablet, Place 1 tablet (0.4 mg total) under the tongue every 5 (five) minutes as needed for chest pain., Disp: 25 tablet, Rfl: 3 .  ONETOUCH ULTRA test strip, TESTING 4 TIMES DAILY., Disp: 150 each, Rfl: 5 .  pantoprazole (PROTONIX) 40 MG tablet, Take one daily, Disp: 90 tablet, Rfl: 1 .  sertraline (ZOLOFT) 100 MG tablet, Take 1.5 tablets (150 mg total) by mouth daily., Disp: 135 tablet, Rfl: 2 .   traZODone (DESYREL) 50 MG tablet, Take 1 tablet (50 mg total) by mouth at bedtime., Disp: 30 tablet, Rfl: 2 .  diclofenac (CATAFLAM) 50 MG tablet, Take 1 tablet (50 mg total) by mouth 2 (two) times daily., Disp: 90 tablet, Rfl: 3 .  methocarbamol (ROBAXIN) 500 MG tablet, Take 1 tablet (500 mg total) by mouth 3 (three) times daily., Disp: 60 tablet, Rfl: 1 .  ranolazine (RANEXA) 1000 MG SR tablet, Take 1 tablet (1,000 mg total) by mouth 2 (two) times daily for 30 days., Disp: 60 tablet, Rfl: 0   PHYSICAL EXAM SECTION: 1) BP 128/87   Pulse 94   Ht 6' 2"  (1.88 m)   Wt 264 lb (119.7 kg)   BMI 33.90 kg/m   Body mass index is 33.9 kg/m. General appearance: Well-developed  well-nourished no gross deformities  2) Cardiovascular normal pulse and perfusion in all 4 extremities normal color without edema  3) Neurologically deep tendon reflexes are equal and normal, no sensation loss or deficits no pathologic reflexes  4) Psychological: Awake alert and oriented x3 mood and affect normal  5) Skin no lacerations or ulcerations no nodularity no palpable masses, no erythema or nodularity  6) Musculoskeletal:   Normal range of motion left hip no instability normal strength left leg normal reflexes left leg  Right hip normal range of motion strength stability normal reflexes right leg normal strength right leg  Tenderness right lower back straight leg raise is negative bilaterally  MEDICAL DECISION SECTION:  Encounter Diagnosis  Name Primary?  . Chronic right-sided low back pain with right-sided sciatica Yes    Imaging Images were taken in February, pelvis and lower back no disease in the hip spondylosis lumbar spine  Plan:  (Rx., Inj., surg., Frx, MRI/CT, XR:2)  Physical therapy Activity modification no lifting for 3 weeks careful on the lawn more Meds ordered this encounter  Medications  . diclofenac (CATAFLAM) 50 MG tablet    Sig: Take 1 tablet (50 mg total) by mouth 2 (two) times  daily.    Dispense:  90 tablet    Refill:  3  . methocarbamol (ROBAXIN) 500 MG tablet    Sig: Take 1 tablet (500 mg total) by mouth 3 (three) times daily.    Dispense:  60 tablet    Refill:  1     9:14 AM Arther Abbott, MD  04/27/2019

## 2019-05-04 ENCOUNTER — Encounter (HOSPITAL_COMMUNITY): Payer: Self-pay | Admitting: Psychiatry

## 2019-05-04 ENCOUNTER — Encounter (INDEPENDENT_AMBULATORY_CARE_PROVIDER_SITE_OTHER): Payer: Self-pay | Admitting: Internal Medicine

## 2019-05-04 ENCOUNTER — Ambulatory Visit (INDEPENDENT_AMBULATORY_CARE_PROVIDER_SITE_OTHER): Payer: PRIVATE HEALTH INSURANCE | Admitting: Internal Medicine

## 2019-05-04 ENCOUNTER — Ambulatory Visit (INDEPENDENT_AMBULATORY_CARE_PROVIDER_SITE_OTHER): Payer: PRIVATE HEALTH INSURANCE | Admitting: Psychiatry

## 2019-05-04 ENCOUNTER — Telehealth: Payer: Self-pay

## 2019-05-04 ENCOUNTER — Other Ambulatory Visit: Payer: Self-pay

## 2019-05-04 ENCOUNTER — Encounter (INDEPENDENT_AMBULATORY_CARE_PROVIDER_SITE_OTHER): Payer: Self-pay | Admitting: *Deleted

## 2019-05-04 VITALS — BP 148/96 | HR 111 | Temp 98.5°F | Resp 18 | Ht 74.0 in | Wt 257.4 lb

## 2019-05-04 DIAGNOSIS — K219 Gastro-esophageal reflux disease without esophagitis: Secondary | ICD-10-CM | POA: Diagnosis not present

## 2019-05-04 DIAGNOSIS — K7469 Other cirrhosis of liver: Secondary | ICD-10-CM | POA: Diagnosis not present

## 2019-05-04 DIAGNOSIS — F321 Major depressive disorder, single episode, moderate: Secondary | ICD-10-CM

## 2019-05-04 MED ORDER — ALPRAZOLAM 0.5 MG PO TABS: 0.5000 mg | ORAL_TABLET | Freq: Three times a day (TID) | ORAL | 2 refills | Status: DC | PRN

## 2019-05-04 MED ORDER — TRAZODONE HCL 50 MG PO TABS: 50.0000 mg | ORAL_TABLET | Freq: Every day | ORAL | 2 refills | Status: DC

## 2019-05-04 MED ORDER — SERTRALINE HCL 100 MG PO TABS: 150.0000 mg | ORAL_TABLET | Freq: Every day | ORAL | 2 refills | Status: DC

## 2019-05-04 NOTE — Telephone Encounter (Addendum)
Patient called stating he was prescribed Diclofenac and he takes Aspirin. Stated that ever since he took it he has been sick so he stopped taking it. I told him I would relay this information to you. He is asking if you could prescribe something else.  PATIENT USES [facility]  Please advise

## 2019-05-04 NOTE — Progress Notes (Signed)
Presenting complaint;  Follow-up for epigastric pain. History of [place]/cirrhosis.  Database and subjective:  Patient is 58 year old Caucasian male who has a history of GERD as well as cirrhosis secondary to NASH who was last seen about 4 months ago for epigastric and left upper quadrant pain.  Review of the records revealed that he had been on meloxicam for back issues which he took for about 6 weeks.  His symptoms persisted even after stopping the medication.  He therefore underwent EGD on 01/21/2019.  EGD was negative for peptic ulcer disease.  He had patchy gastric erythema but biopsy was negative for H. pylori.  He had multiple gastric polyps and biopsy revealed them to be fundic gland polyps.  I felt some of his pain was musculoskeletal pain and I recommended using Lidoderm patch.  Patient states gradually his pain resolved and he was fine until he was begun on diclofenac by Dr. Arther Abbott for a flareup of his back pain.  He developed epigastric pain nausea and diarrhea after taking this medication for 3 days.  He did not experience melena rectal bleeding fever or chills.  He states pain was similar to the pain he had few months ago.  He states it took 3 days for him to get over his diarrhea and nausea.  Today he is feeling better.  He says heartburn is well controlled with medication.  He denies dysphagia.  He has not lost any weight since his last visit about 4 months ago.  He says he is watching his diet but needs to be more committed.  He states he has had hepatitis a and B vaccination.   Current Medications: Outpatient Encounter Medications as of 05/04/2019  Medication Sig  . albuterol (PROVENTIL HFA;VENTOLIN HFA) 108 (90 Base) MCG/ACT inhaler Inhale 2 puffs into the lungs every 6 (six) hours as needed for wheezing or shortness of breath.  . ALPRAZolam (XANAX) 0.5 MG tablet Take 1 tablet (0.5 mg total) by mouth 3 (three) times daily as needed for sleep or anxiety.  Marland Kitchen aspirin (ASPIR-LOW)  81 MG EC tablet Take 1 tablet (81 mg total) by mouth daily.  Marland Kitchen atorvastatin (LIPITOR) 40 MG tablet TAKE ONE (1) TABLET BY MOUTH EVERY DAY  . diltiazem (CARDIZEM) 30 MG tablet TAKE (1) TABLET BY MOUTH TWICE DAILY.  . fluticasone (FLOVENT HFA) 110 MCG/ACT inhaler Inhale 2 puffs into the lungs 2 (two) times a day.  . gabapentin (NEURONTIN) 300 MG capsule Take 1 capsule (300 mg total) by mouth 4 (four) times daily.  Marland Kitchen glipiZIDE (GLUCOTROL XL) 5 MG 24 hr tablet Take 2 tablets (10 mg total) by mouth daily with breakfast. (Patient taking differently: Take 10 mg by mouth 2 times daily at 12 noon and 4 pm. )  . Icosapent Ethyl (VASCEPA) 1 g CAPS Take 2 capsules (2 g total) by mouth 2 (two) times a day.  . Insulin Glargine, 2 Unit Dial, (TOUJEO MAX SOLOSTAR) 300 UNIT/ML SOPN Inject 120 Units into the skin at bedtime.  . Lancets MISC 1 each by Does not apply route 4 (four) times daily.  Marland Kitchen lidocaine (LIDODERM) 5 % Place 1 patch onto the skin every 12 (twelve) hours. Remove & Discard patch within 12 hours or as directed by MD  . losartan (COZAAR) 25 MG tablet TAKE (1) TABLET BY MOUTH ONCE DAILY.  . meclizine (ANTIVERT) 25 MG tablet Take 1 tablet (25 mg total) by mouth 3 (three) times daily as needed for dizziness.  . metFORMIN (GLUCOPHAGE) 500 MG  tablet TAKE 2 TABLETS IN THE MORNING, 1 TABLET AT NOON, AND 2 TABLETS IN THEEVENING. (Patient taking differently: Give w/food. Patient takes 2 in the morning and 2 at supper.)  . nitroGLYCERIN (NITROSTAT) 0.4 MG SL tablet Place 1 tablet (0.4 mg total) under the tongue every 5 (five) minutes as needed for chest pain.  Glory Rosebush ULTRA test strip TESTING 4 TIMES DAILY.  . pantoprazole (PROTONIX) 40 MG tablet Take one daily  . ranolazine (RANEXA) 1000 MG SR tablet Take 1 tablet (1,000 mg total) by mouth 2 (two) times daily for 30 days.  Marland Kitchen sertraline (ZOLOFT) 100 MG tablet Take 1.5 tablets (150 mg total) by mouth daily.  . traZODone (DESYREL) 50 MG tablet Take 1 tablet (50  mg total) by mouth at bedtime.  . diclofenac (CATAFLAM) 50 MG tablet Take 1 tablet (50 mg total) by mouth 2 (two) times daily. (Patient not taking: Reported on 05/04/2019)  . [DISCONTINUED] methocarbamol (ROBAXIN) 500 MG tablet Take 1 tablet (500 mg total) by mouth 3 (three) times daily. (Patient not taking: Reported on 05/04/2019)   No facility-administered encounter medications on file as of 05/04/2019.      Objective: Blood pressure (!) 148/96, pulse (!) 111, temperature 98.5 F (36.9 C), temperature source Oral, resp. rate 18, height 6' 2"  (1.88 m), weight 257 lb 6.4 oz (116.8 kg). Patient is alert and in no acute distress. Conjunctiva is pink. Sclera is nonicteric Oropharyngeal mucosa is normal. No neck masses or thyromegaly noted. Cardiac exam with regular rhythm normal S1 and S2. No murmur or gallop noted. Lungs are clear to auscultation. Abdomen is full.  On palpation is soft.  He has mild midepigastric tenderness.  Liver edge is indistinct below RCM.  Spleen is nonpalpable. No LE edema or clubbing noted.  Labs/studies Results:  CBC Latest Ref Rng & Units 01/05/2019 01/04/2019 07/12/2018  WBC 4.0 - 10.5 K/uL 7.5 5.7 7.6  Hemoglobin 13.0 - 17.0 g/dL 14.8 15.2 15.3  Hematocrit 39.0 - 52.0 % 43.7 43.9 46.2  Platelets 150 - 400 K/uL 151 152 174    CMP Latest Ref Rng & Units 04/15/2019 01/14/2019 01/04/2019  Glucose 65 - 99 mg/dL 204(H) 196(H) 298(H)  BUN 6 - 24 mg/dL 13 13 16   Creatinine 0.76 - 1.27 mg/dL 0.90 0.85 1.08  Sodium 134 - 144 mmol/L 141 139 138  Potassium 3.5 - 5.2 mmol/L 3.9 4.0 3.7  Chloride 96 - 106 mmol/L 100 99 102  CO2 20 - 29 mmol/L 20 23 23   Calcium 8.7 - 10.2 mg/dL 9.4 9.1 9.1  Total Protein 6.0 - 8.5 g/dL 6.9 6.8 -  Total Bilirubin 0.0 - 1.2 mg/dL 0.4 0.6 -  Alkaline Phos 39 - 117 IU/L 51 46 -  AST 0 - 40 IU/L 33 24 -  ALT 0 - 44 IU/L 49(H) 48(H) -    Hepatic Function Latest Ref Rng & Units 04/15/2019 01/14/2019 10/18/2018  Total Protein 6.0 - 8.5 g/dL 6.9  6.8 6.7  Albumin 3.8 - 4.9 g/dL 4.5 4.4 4.5  AST 0 - 40 IU/L 33 24 21  ALT 0 - 44 IU/L 49(H) 48(H) 34  Alk Phosphatase 39 - 117 IU/L 51 46 46  Total Bilirubin 0.0 - 1.2 mg/dL 0.4 0.6 0.8  Bilirubin, Direct 0.00 - 0.40 mg/dL - - -     Assessment:  #1.  Recent bout with epigastric pain nausea and diarrhea secondary to diclofenac which he has discontinued.  It appears to me that he is intolerant  of NSAIDs.  He therefore should try not to use them unless absolutely necessary in which case he can try low-dose dose celecoxib but I will leave it up to Dr. Arther Abbott.  #2.  Chronic GERD.  Typical symptoms are well controlled.  Recent EGD was negative for erosive reflux disease.  #3.  Cirrhosis secondary to nonalcoholic steatohepatitis.  He had biopsy few years ago at [facility] in [facility].  He has received hepatitis A and B vaccination.  He is due for screening ultrasound.  His disease appears to be well compensated.  Recent EGD was negative for esophageal or gastric varices.  Patient must increase physical activity as tolerated and try to lose some weight.   Plan:  Patient advised to refrain from NSAIDs perhaps other than celecoxib at a low dose only if absolutely necessary. Schedule upper abdominal ultrasound. Office visit in 1 year.

## 2019-05-04 NOTE — Patient Instructions (Addendum)
Remember you cannot take NSAIDs other than low-dose aspirin. Physician will call with results of ultrasound when completed.

## 2019-05-04 NOTE — Progress Notes (Signed)
Virtual Visit via Video Note  I connected with Jared Griffin on 05/04/19 at  9:00 AM EDT by a video enabled telemedicine application and verified that I am speaking with the correct person using two identifiers.   I discussed the limitations of evaluation and management by telemedicine and the availability of in person appointments. The patient expressed understanding and agreed to proceed.     I discussed the assessment and treatment plan with the patient. The patient was provided an opportunity to ask questions and all were answered. The patient agreed with the plan and demonstrated an understanding of the instructions.   The patient was advised to call back or seek an in-person evaluation if the symptoms worsen or if the condition fails to improve as anticipated.  I provided 15 minutes of non-face-to-face time during this encounter.   Jared Spiller, MD  [facility] MD/PA/NP OP Progress Note  05/04/2019 9:16 AM Jared Griffin  MRN:  [id number]  Chief Complaint:  Chief Complaint    Depression; Anxiety; Follow-up     HPI: This patient is a 58 year old married white male lives with his wife and 59 year old son in [place]. 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 zone  The patient was referred by his primary physician, Dr. Sallee Griffin, for further assessment and treatment of anxiety and depression.  The patient states that he is become increasingly depressed and anxious over the last couple of years due to work stress. He stated that he was always under pressure to get more things done than he had time to do. 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 He got to the point of hating going to work. Several years ago he had cardiac problems and had a stent placed. In January of this  year he began developing dizzy spells chest pain headaches stomach aches. It got so bad that he was readmitted for cardiac catheterization but nothing new was found.  The patient was having difficulty sleeping constant worry and anxiety about his job sadness and anger. He was getting irritable with people at home. After talking to his primary care physician at length and decided to take him out on medical retirement. The patient has been on Wellbutrin SR 150 no gram twice a day for several weeks now. He had also been out in the past when his wife was sick. He has not seen any benefit from this but does feel tremendously better since he stopped working. He is also on Xanax 0.5 mg 3 times a day which he thinks has helped to some degree.  Since stopping working he's had a big turnaround. His sleep and energy have improved. 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 He is much less anxious and is no longer having the chest pain dizzy spells or stomachaches. He denies suicidal ideation or psychotic symptoms. He does not use drugs or alcohol  The patient returns after 1 month.  Last time he is mentioned being increasingly depressed and anxious.  He states that he is feeling better this time.  His church has resumed and they are using social distancing but he is able to see his friends and this is helped quite a bit.  He continues in counseling with Jared Griffin.  He thinks that  the increase in Zoloft has helped as well as the change to Xanax.  He feels less anxious.  He denies any thoughts of self-harm or suicidal ideation.  He seems to have a bit more energy.  He is now struggling with back pain and recently went to see Dr. Aline Griffin.  However he states he cannot take the medications that Dr. Aline Griffin gave him because of stomach problems.  He is sleeping better with the trazodone but does not need to use it every night. Visit Diagnosis:    ICD-10-CM   1. Moderate single current episode of  major depressive disorder ([place])  F32.1     Past Psychiatric History:none  Past Medical History:  Past Medical History:  Diagnosis Date  . Anginal pain ([place])   . Anxiety   . 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 COPD (chronic obstructive pulmonary disease) ([place])    very close to being diagnosed  . Coronary artery disease   . Coronary atherosclerosis of native coronary artery    a. 12/09/2013: s/p PCI with 3.0 x 28 Promus DES to mLAD  . Cyst (solitary) of breast 10/03/2017   removed from back  . Depression   . Dyspnea    take inhaler  . Elbow injury 07/25/2017   surgery for torn tendon  . Essential hypertension, benign   . Fatty liver disease, nonalcoholic   . Lateral epicondylitis of right elbow   . Mixed hyperlipidemia 2006  . Obesity   . Osgood-Schlatter's disease of right knee   . Type 2 diabetes mellitus (Jared Griffin)     Past Surgical History:  Procedure Laterality Date  . BIOPSY  01/21/2019   Procedure: BIOPSY;  Surgeon: [name], MD;  [facility];  Service: Endoscopy;;  gastric bx and gastric polyps  . CARDIAC CATHETERIZATION  2011  . CARDIAC CATHETERIZATION N/A 09/20/2015   Procedure: Left Heart Cath and Coronary Angiography;  Surgeon: [name], MD;  Location: [facility];  Service: Cardiovascular;  Laterality: N/A;  . CHOLECYSTECTOMY  2003  . COLONOSCOPY  06/19/2012   Procedure: COLONOSCOPY;  Surgeon: [name], MD;  [facility];  Service: Endoscopy;  Laterality: N/A;  930  . COLONOSCOPY N/A 10/17/2017   Procedure: COLONOSCOPY;  Surgeon: [name], MD;  [facility];  Service: Endoscopy;  Laterality: N/A;  1225  . ESOPHAGOGASTRODUODENOSCOPY (EGD) WITH PROPOFOL N/A 01/21/2019   Procedure: ESOPHAGOGASTRODUODENOSCOPY (EGD) WITH PROPOFOL;  Surgeon: [name], MD;  [facility];  Service: Endoscopy;  Laterality: N/A;  10:15am  . KNEE ARTHROPLASTY Right 1999  . LEFT HEART CATH  AND CORONARY ANGIOGRAPHY N/A 02/26/2018   Procedure: LEFT HEART CATH AND CORONARY ANGIOGRAPHY;  Surgeon: [name], MD;  Location: [place] [facility];  Service: Cardiovascular;  Laterality: N/A;  . LEFT HEART CATHETERIZATION WITH CORONARY ANGIOGRAM N/A 12/09/2013   Procedure: LEFT HEART CATHETERIZATION WITH CORONARY ANGIOGRAM;  Surgeon: [name], MD;  Location: [facility] CATH LAB;  Service: Cardiovascular;  Laterality: N/A;  . Liver biopsy    . POLYPECTOMY  10/17/2017   Procedure: POLYPECTOMY;  Surgeon: [name], MD;  [facility];  Service: Endoscopy;;  ascending colon, splenic flexure,sigmoid x2  . TENNIS ELBOW RELEASE/NIRSCHEL PROCEDURE Right 07/25/2017   Procedure: TENNIS ELBOW RELEASE and debriedment;  Surgeon: [name], MD;  Location: [facility];  Service: Orthopedics;  Laterality: Right;  . TONSILLECTOMY  1970's  Family Psychiatric History: see below  Family History:  Family History  Problem Relation Age of Onset  . Cancer Maternal Aunt   . CVA Maternal Grandmother   . CVA Maternal Grandfather     Social History:  Social History   Socioeconomic History  . Marital status: Married    Spouse name: Not on file  . Number of children: Not on file  . Years of education: Not on file  . Highest education level: Not on file  Occupational History  . Not on file  Social Needs  . Financial resource strain: Not hard at all  . Food insecurity    Worry: Never true    Inability: Never true  . Transportation needs    Medical: No    Non-medical: No  Tobacco Use  . Smoking status: Former Smoker    Packs/day: 0.50    Years: 10.00    Pack years: 5.00    Types: Cigarettes    Start date: 02/02/1973    Quit date: 09/17/1983    Years since quitting: 35.6  . Smokeless tobacco: Former Systems developer    Types: [place] date: 09/17/1983  Substance and Sexual Activity  . Alcohol use: No    Alcohol/week: 0.0 standard drinks  . Drug use: No  . Sexual  activity: Yes  Lifestyle  . Physical activity    Days per week: Patient refused    Minutes per session: Patient refused  . Stress: To some extent  Relationships  . Social Herbalist on phone: Patient refused    Gets together: Patient refused    Attends religious service: Patient refused    Active member of club or organization: Patient refused    Attends meetings of clubs or organizations: Patient refused    Relationship status: Patient refused  Other Topics Concern  . 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    Married with 1 child   Right handed    12 th    Very little caffeine    Allergies:  Allergies  Allergen Reactions  . Metoprolol Rash    Metabolic Disorder Labs: Lab Results  Component Value Date   HGBA1C 8.9 (H) 04/15/2019   MPG 211.6 07/12/2018   MPG 197.25 07/23/2017   No results found for: PROLACTIN Lab Results  Component Value Date   CHOL 116 10/18/2018   TRIG 173 (H) 10/18/2018   HDL 30 (L) 10/18/2018   CHOLHDL 3.9 10/18/2018   VLDL UNABLE TO CALCULATE IF TRIGLYCERIDE OVER 400 mg/dL 07/12/2018   LDLCALC 51 10/18/2018   LDLCALC UNABLE TO CALCULATE IF TRIGLYCERIDE OVER 400 mg/dL 07/12/2018   Lab Results  Component Value Date   TSH 0.525 07/12/2018   TSH 0.458 04/13/2017    Therapeutic Level Labs: No results found for: LITHIUM No results found for: VALPROATE No components found for:  CBMZ  Current Medications: Current Outpatient Medications  Medication Sig Dispense Refill  . albuterol (PROVENTIL HFA;VENTOLIN HFA) 108 (90 Base) MCG/ACT inhaler Inhale 2 puffs into the lungs every 6 (six) hours as needed for wheezing or shortness of breath. 1 Inhaler 2  . ALPRAZolam (XANAX) 0.5 MG tablet Take 1 tablet (0.5 mg total) by mouth 3 (three) times daily as needed for sleep or anxiety. 90 tablet 2  . aspirin (ASPIR-LOW) 81 MG EC tablet Take 1 tablet (81 mg  total) by mouth daily. [place]  tablet 12  . atorvastatin (LIPITOR) 40 MG tablet TAKE ONE (1) TABLET BY MOUTH EVERY DAY 90 tablet 3  . diclofenac (CATAFLAM) 50 MG tablet Take 1 tablet (50 mg total) by mouth 2 (two) times daily. 90 tablet 3  . diltiazem (CARDIZEM) 30 MG tablet TAKE (1) TABLET BY MOUTH TWICE DAILY. 180 tablet 1  . fluticasone (FLOVENT HFA) 110 MCG/ACT inhaler Inhale 2 puffs into the lungs 2 (two) times a day. 1 Inhaler 5  . gabapentin (NEURONTIN) 300 MG capsule Take 1 capsule (300 mg total) by mouth 4 (four) times daily. 120 capsule 5  . glipiZIDE (GLUCOTROL XL) 5 MG 24 hr tablet Take 2 tablets (10 mg total) by mouth daily with breakfast. 180 tablet 0  . Icosapent Ethyl (VASCEPA) 1 g CAPS Take 2 capsules (2 g total) by mouth 2 (two) times a day. 120 capsule 11  . Insulin Glargine, 2 Unit Dial, (TOUJEO MAX SOLOSTAR) 300 UNIT/ML SOPN Inject 120 Units into the skin at bedtime. 6 pen 2  . Lancets MISC 1 each by Does not apply route 4 (four) times daily. 150 each 5  . lidocaine (LIDODERM) 5 % Place 1 patch onto the skin every 12 (twelve) hours. Remove & Discard patch within 12 hours or as directed by MD 30 patch 0  . losartan (COZAAR) 25 MG tablet TAKE (1) TABLET BY MOUTH ONCE DAILY. 90 tablet 1  . meclizine (ANTIVERT) 25 MG tablet Take 1 tablet (25 mg total) by mouth 3 (three) times daily as needed for dizziness. 30 tablet 0  . metFORMIN (GLUCOPHAGE) 500 MG tablet TAKE 2 TABLETS IN THE MORNING, 1 TABLET AT NOON, AND 2 TABLETS IN THEEVENING. 450 tablet 0  . methocarbamol (ROBAXIN) 500 MG tablet Take 1 tablet (500 mg total) by mouth 3 (three) times daily. 60 tablet 1  . nitroGLYCERIN (NITROSTAT) 0.4 MG SL tablet Place 1 tablet (0.4 mg total) under the tongue every 5 (five) minutes as needed for chest pain. 25 tablet 3  . ONETOUCH ULTRA test strip TESTING 4 TIMES DAILY. 150 each 5  . pantoprazole (PROTONIX) 40 MG tablet Take one daily 90 tablet 1  . ranolazine (RANEXA) 1000 MG SR tablet Take 1  tablet (1,000 mg total) by mouth 2 (two) times daily for 30 days. 60 tablet 0  . sertraline (ZOLOFT) 100 MG tablet Take 1.5 tablets (150 mg total) by mouth daily. 135 tablet 2  . traZODone (DESYREL) 50 MG tablet Take 1 tablet (50 mg total) by mouth at bedtime. 30 tablet 2   No current facility-administered medications for this visit.      Musculoskeletal: Strength & Muscle Tone: within normal limits Gait & Station: normal Patient leans: N/A  Psychiatric Specialty Exam: Review of Systems  Musculoskeletal: Positive for back pain and joint pain.  All other systems reviewed and are negative.   There were no vitals taken for this visit.There is no height or weight on file to calculate BMI.  General Appearance: Casual, Neat and Well Groomed  Eye Contact:  Good  Speech:  Clear and Coherent  Volume:  Normal  Mood:  Euthymic  Affect:  Appropriate and Congruent  Thought Process:  Goal Directed  Orientation:  Full (Time, Place, and Person)  Thought Content: Rumination   Suicidal Thoughts:  No  Homicidal Thoughts:  No  Memory:  Immediate;   Good Recent;   Good Remote;   Good  Judgement:  Good  Insight:  Good  Psychomotor Activity:  Normal  Concentration:  Concentration: Good and Attention Span: Good  Recall:  Good  Fund of Knowledge: Good  Language: Good  Akathisia:  No  Handed:  Right  AIMS (if indicated): not done  Assets:  Communication Skills Desire for Improvement Resilience Social Support Talents/Skills  ADL's:  Intact  Cognition: WNL  Sleep:  Good   Screenings: GAD-7     Office Visit from 08/05/2018 in [facility]  Total GAD-7 Score  5    PHQ2-9     Nutrition from 08/06/2018 in [facility] Office Visit from 08/05/2018 in [place] Office Visit from 07/24/2018 in [facility] Office Visit from 12/02/2017 in [place] Office Visit from 08/02/2017 in  [facility]  PHQ-2 Total Score  0  1  0  0  0  PHQ-9 Total Score  -  3  -  1  -       Assessment and Plan: This patient is a 58 year old man with a history of significant depression and anxiety.  He seems to be doing better on the increased dosages of medication.  He will continue Zoloft 150 mg daily for depression and continue trazodone 50 mg at bedtime as needed for sleep.  He will also continue Xanax 0.5 mg 3 times daily as needed for anxiety.  He will return to see me in 3 months   Jared Spiller, MD 05/04/2019, 9:16 AM

## 2019-05-06 ENCOUNTER — Other Ambulatory Visit: Payer: Self-pay | Admitting: Orthopedic Surgery

## 2019-05-06 MED ORDER — CELECOXIB 200 MG PO CAPS: 200.0000 mg | ORAL_CAPSULE | Freq: Two times a day (BID) | ORAL | 0 refills | Status: DC

## 2019-05-08 ENCOUNTER — Ambulatory Visit (HOSPITAL_COMMUNITY)
Admission: RE | Admit: 2019-05-08 | Discharge: 2019-05-08 | Disposition: A | Discharge status: Home / Self Care | Payer: PRIVATE HEALTH INSURANCE | Source: Ambulatory Visit | Attending: Internal Medicine | Admitting: Internal Medicine

## 2019-05-08 ENCOUNTER — Other Ambulatory Visit: Payer: Self-pay

## 2019-05-08 DIAGNOSIS — K7469 Other cirrhosis of liver: Secondary | ICD-10-CM | POA: Insufficient documentation

## 2019-05-11 ENCOUNTER — Ambulatory Visit (HOSPITAL_COMMUNITY): Payer: PRIVATE HEALTH INSURANCE | Admitting: Psychiatry

## 2019-05-19 ENCOUNTER — Ambulatory Visit (INDEPENDENT_AMBULATORY_CARE_PROVIDER_SITE_OTHER): Payer: PRIVATE HEALTH INSURANCE | Admitting: Psychiatry

## 2019-05-19 ENCOUNTER — Encounter (HOSPITAL_COMMUNITY): Payer: Self-pay | Admitting: Psychiatry

## 2019-05-19 ENCOUNTER — Other Ambulatory Visit: Payer: Self-pay

## 2019-05-19 DIAGNOSIS — F321 Major depressive disorder, single episode, moderate: Secondary | ICD-10-CM | POA: Diagnosis not present

## 2019-05-19 NOTE — Progress Notes (Signed)
Virtual Visit via Video Note  I connected with Jared Griffin on 05/19/19 at 9:15 AM EDT by a video enabled telemedicine application and verified that I am speaking with the correct person using two identifiers.   I discussed the limitations of evaluation and management by telemedicine and the availability of in person appointments. The patient expressed understanding and agreed to proceed.  I provided 40 minutes of non-face-to-face time during this encounter.   Alonza Smoker, LCSW   THERAPIST PROGRESS NOTE  Session Time: Tuesday 05/19/2019 9:15 AM - 9:55 AM   Participation Level: Active  Behavioral Response: CasualAlertAnxious and Depressed  Type of Therapy: Individual Therapy  Treatment Goals addressed: learn and implement behavioral strategies to overcome depression and manage anxiety  Interventions: Supportive/CBT  Summary: Jared Griffin is a 58 y.o. male who is referred for services by psychiatrist Dr. Harrington Challenger due to patient experiencing symptoms of anxiety and depression.  He reports no psychiatric hospitalizations and denies any previous involvement in outpatient therapy. 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 He has been out on medical leave since that time. He has multiple health issues and has a heart stent. He reports staying anxious and worrying all the time especially about finances as he is on fixed income. He also reports difficulty falling and staying asleep, irritability, memory difficulty, poor concentration, crying spells, and isolative behaviors.   Patient last was seen via virtual visit about 4 weeks ago.  He reports improved mood and increased behavioral activation since last session.  He is attending church on Sundays, going  to physical therapy 3-4 times a week and doing yard work.  He is pleased he is experiencing decreased back pain and has more mobility.  He also reports increased dosage of sertraline as prescribed by Dr. Harrington Challenger has been helpful.  He a reports less stress regarding finances as he used assertive communication with his wife to express concerns and feelings.  He is pleased that they have developed a plan to address their financial issues.  He also expresses less stress regarding Workmen's Compensation issue as he has met with a another attorney who has been very helpful.  Patient reports improved sleep pattern and eating patterns.  He has been practicing deep breathing and says this has helped release tension.   Suicidal/Homicidal: Nowithout intent/plan  Therapist Response:  reviewed symptoms, praised and reinforced patient's increased behavioral activation/use of deep breathing/use of assertive communication, discussed effects on mood/thoughts/behavior, discussed lapse versus relapse, assisted patient identify early warning signs of depression and strategies to use to avoid relapse, discussed stepdown plan for termination to include 2-3 more sessions, discussed using a daily things to do list to assist patient maintain consistent efforts regarding behavioral activation   Plan: Return again in 2 - 3 weeks.  Diagnosis: Axis I: MDD    Axis II: No diagnosis    Alonza Smoker, LCSW 05/19/2019

## 2019-06-05 ENCOUNTER — Other Ambulatory Visit: Payer: Self-pay | Admitting: Cardiovascular Disease

## 2019-06-05 ENCOUNTER — Other Ambulatory Visit: Payer: Self-pay | Admitting: Family Medicine

## 2019-06-08 ENCOUNTER — Other Ambulatory Visit: Payer: Self-pay

## 2019-06-08 ENCOUNTER — Ambulatory Visit (INDEPENDENT_AMBULATORY_CARE_PROVIDER_SITE_OTHER): Payer: PRIVATE HEALTH INSURANCE | Admitting: Orthopedic Surgery

## 2019-06-08 VITALS — BP 130/82 | HR 94 | Temp 97.0°F | Ht 74.0 in | Wt 259.0 lb

## 2019-06-08 DIAGNOSIS — M5441 Lumbago with sciatica, right side: Secondary | ICD-10-CM | POA: Diagnosis not present

## 2019-06-08 DIAGNOSIS — G8929 Other chronic pain: Secondary | ICD-10-CM

## 2019-06-08 NOTE — Progress Notes (Signed)
Chief Complaint  Patient presents with  . Follow-up    Recheck on back pain.    58 year old male treated for lower back pain with some leg pain with physical therapy he is currently asymptomatic for the most part with an occasional soreness  BP 130/82   Pulse 94   Temp (!) 97 F (36.1 C)   Ht 6' 2"  (1.88 m)   Wt 259 lb (117.5 kg)   BMI 33.25 kg/m  Straight leg raises are negative reflexes are normal gait is normal  Patient is released follow-up as needed  Encounter Diagnosis  Name Primary?  . Chronic right-sided low back pain with right-sided sciatica Yes

## 2019-06-19 ENCOUNTER — Ambulatory Visit (INDEPENDENT_AMBULATORY_CARE_PROVIDER_SITE_OTHER): Payer: PRIVATE HEALTH INSURANCE | Admitting: Psychiatry

## 2019-06-19 ENCOUNTER — Other Ambulatory Visit: Payer: Self-pay

## 2019-06-19 ENCOUNTER — Other Ambulatory Visit: Payer: Self-pay | Admitting: Cardiovascular Disease

## 2019-06-19 DIAGNOSIS — F321 Major depressive disorder, single episode, moderate: Secondary | ICD-10-CM | POA: Diagnosis not present

## 2019-06-19 NOTE — Progress Notes (Signed)
Virtual Visit via Video Note  I connected with Jared Griffin on 06/19/19 at 10:15 AM EDT by a video enabled telemedicine application and verified that I am speaking with the correct person using two identifiers.   I discussed the limitations of evaluation and management by telemedicine and the availability of in person appointments. The patient expressed understanding and agreed to proceed.  I provided 25 minutes of non-face-to-face time during this encounter.   Alonza Smoker, LCSW    THERAPIST PROGRESS NOTE  Session Time:  Friday 06/19/2019 10:15 AM - 10:40 AM   Participation Level: Active  Behavioral Response: CasualAlert/euthymic  Type of Therapy: Individual Therapy  Treatment Goals addressed: learn and implement behavioral strategies to overcome depression and manage anxiety  Interventions: Supportive/CBT  Summary: Jared Griffin is a 58 y.o. male who is referred for services by psychiatrist Dr. Harrington Challenger due to patient experiencing symptoms of anxiety and depression.  He reports no psychiatric hospitalizations and denies any previous involvement in outpatient therapy. 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 He has been out on medical leave since that time. He has multiple health issues and has a heart stent. He reports staying anxious and worrying all the time especially about finances as he is on fixed income. He also reports difficulty falling and staying asleep, irritability, memory difficulty, poor concentration, crying spells, and isolative behaviors.   Patient last was seen via virtual visit about 4 weeks ago.  He reports doing well since last session. He says he felt "down" for about 3 days.  He cannot identify any specific trigger.  He  reports having poor motivation, loss of interest in activities, just laying around, and wanting to be by himself during that time.  He informed his wife about how he was feeling.  By the third day, he reports pushing himself to become involved in activities and reports starting to feel better later that day.  He reports he now has resumed regular involvement in activities and socialization.  He also is looking forward to different activities for the future including preparing to go hunting.  He reports less financial stress as he sold a boat and is planning to use the proceeds to pay off bills.  He is continuing to experience pain and reports this has affected his sleep pattern. He continues to work with attorney regarding Worker's Comp injury.  Patient remains pleased with his progress in therapy and his use of coping skills. Suicidal/Homicidal: Nowithout intent/plan  Therapist Response:  reviewed symptoms, praised and reinforced patient's early recognition of signs of depression, praised and reinforced his use of healthy coping techniques such as talking with his wife and eventually engaging in activity, assisted patient try to identify triggers of increased symptoms of depression, also discussed the connection between pain and depression, reviewed lapse versus relapse, assisted patient examine his thought patterns affecting depression, discussed vicious cycle of depression and using behavioral activation to intervene in the cycle, assigned patient to complete mental health maintenance plan sent via email in preparation for next session, agreed to terminate at next session,   Plan: Return again in 2 - 3 weeks.  Diagnosis: Axis I: MDD    Axis II: No diagnosis  Alonza Smoker, LCSW 06/19/2019

## 2019-07-10 ENCOUNTER — Telehealth (HOSPITAL_COMMUNITY): Payer: Self-pay | Admitting: Psychiatry

## 2019-07-10 ENCOUNTER — Ambulatory Visit (HOSPITAL_COMMUNITY): Payer: PRIVATE HEALTH INSURANCE | Admitting: Psychiatry

## 2019-07-10 ENCOUNTER — Other Ambulatory Visit: Payer: Self-pay

## 2019-07-10 NOTE — Telephone Encounter (Signed)
Therapist attempted to contact patient for scheduled appointment twice via text through [email or web address] platform, no response.  Therapist called patient and left message indicating attempt and requesting patient call office.

## 2019-07-14 ENCOUNTER — Ambulatory Visit (INDEPENDENT_AMBULATORY_CARE_PROVIDER_SITE_OTHER): Payer: PRIVATE HEALTH INSURANCE | Admitting: Psychiatry

## 2019-07-14 ENCOUNTER — Other Ambulatory Visit: Payer: Self-pay

## 2019-07-14 DIAGNOSIS — F321 Major depressive disorder, single episode, moderate: Secondary | ICD-10-CM | POA: Diagnosis not present

## 2019-07-14 NOTE — Progress Notes (Signed)
Virtual Visit via Video Note  I connected with Jared Griffin on 07/14/19 at  2:00 PM EDT by a video enabled telemedicine application and verified that I am speaking with the correct person using two identifiers.   I discussed the limitations of evaluation and management by telemedicine and the availability of in person appointments. The patient expressed understanding and agreed to proceed.  I provided 40  minutes of non-face-to-face time during this encounter.   Alonza Smoker, LCSW   THERAPIST PROGRESS NOTE  Session Time:  Tuesday 07/14/2019 2:00 PM - 2:40 PM  Participation Level: Active  Behavioral Response: CasualAlert/anxious,sad  Type of Therapy: Individual Therapy  Treatment Goals addressed: learn and implement behavioral strategies to overcome depression and manage anxiety  Interventions: Supportive/CBT  Summary: Jared Griffin is a 58 y.o. male who is referred for services by psychiatrist Dr. Harrington Challenger due to patient experiencing symptoms of anxiety and depression.  He reports no psychiatric hospitalizations and denies any previous involvement in outpatient therapy. 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 He has been out on medical leave since that time. He has multiple health issues and has a heart stent. He reports staying anxious and worrying all the time especially about finances as he is on fixed income. He also reports difficulty falling and staying asleep, irritability, memory difficulty, poor concentration, crying spells, and isolative behaviors.   Patient last was seen via virtual visit about 4 weeks ago.  He reports increased stress since last session.  Per his report, a cousin who is like a brother to him is hospitalized in  critical condition and unresponsive due to having Covid 23 and a staph infection.  Patient and his wife have been trying to support his cousin's wife and cousin's brother in making critical decisions about the cousin's care.  He reports additional stress related to both of his parents being diagnosed with Covid 66.  They both remain at home but mother had to be seen at the hospital yesterday due to shortness of breath.  Patient and his wife provide help for them such as taking groceries and leaving it outside the door.  Patient reports moments of sadness/tearfulness, worry, and wanting to be alone but coping fairly well.  He is experiencing some sleep difficulty due to ruminating thoughts.  He reports support from his wife, extended family members, church family, and friends.  Patient reports using his spirituality including praying and reading his Bible.  He reports trying to get physical activity by walking around in his yard as well as mowing.    Suicidal/Homicidal: Nowithout intent/plan  Therapist Response:  reviewed symptoms, praised and reinforced his use of healthy coping techniques, discussed stressors, facilitated expression of thoughts and feelings, validated and normalized feelings, assisted patient identify other ways to use support system if needed, discussed rationale for and assisted patient practice mindfulness technique using breath awareness to cope with ruminating thoughts and to use as a meditation, discussed postponing termination at this time in light of current stressors,   Plan: Return again in 4 weeks  Diagnosis: Axis I: MDD    Axis II: No diagnosis    Alonza Smoker, LCSW 07/14/2019

## 2019-07-19 HISTORY — PX: CYST EXCISION: SHX5701

## 2019-07-30 ENCOUNTER — Other Ambulatory Visit: Payer: Self-pay | Admitting: "Endocrinology

## 2019-08-04 ENCOUNTER — Encounter: Payer: Self-pay | Admitting: Cardiovascular Disease

## 2019-08-04 ENCOUNTER — Encounter (HOSPITAL_COMMUNITY): Payer: Self-pay | Admitting: Psychiatry

## 2019-08-04 ENCOUNTER — Ambulatory Visit (INDEPENDENT_AMBULATORY_CARE_PROVIDER_SITE_OTHER): Payer: PRIVATE HEALTH INSURANCE | Admitting: Psychiatry

## 2019-08-04 ENCOUNTER — Other Ambulatory Visit: Payer: Self-pay

## 2019-08-04 ENCOUNTER — Telehealth (INDEPENDENT_AMBULATORY_CARE_PROVIDER_SITE_OTHER): Payer: PRIVATE HEALTH INSURANCE | Admitting: Cardiovascular Disease

## 2019-08-04 VITALS — Ht 74.0 in | Wt 250.0 lb

## 2019-08-04 DIAGNOSIS — I25118 Atherosclerotic heart disease of native coronary artery with other forms of angina pectoris: Secondary | ICD-10-CM

## 2019-08-04 DIAGNOSIS — Z794 Long term (current) use of insulin: Secondary | ICD-10-CM

## 2019-08-04 DIAGNOSIS — F321 Major depressive disorder, single episode, moderate: Secondary | ICD-10-CM

## 2019-08-04 DIAGNOSIS — E785 Hyperlipidemia, unspecified: Secondary | ICD-10-CM

## 2019-08-04 DIAGNOSIS — E119 Type 2 diabetes mellitus without complications: Secondary | ICD-10-CM

## 2019-08-04 DIAGNOSIS — Z955 Presence of coronary angioplasty implant and graft: Secondary | ICD-10-CM

## 2019-08-04 DIAGNOSIS — I10 Essential (primary) hypertension: Secondary | ICD-10-CM

## 2019-08-04 MED ORDER — SERTRALINE HCL 100 MG PO TABS: 150.0000 mg | ORAL_TABLET | Freq: Every day | ORAL | 2 refills | Status: DC

## 2019-08-04 MED ORDER — TRAZODONE HCL 50 MG PO TABS: 50.0000 mg | ORAL_TABLET | Freq: Every day | ORAL | 2 refills | Status: DC

## 2019-08-04 MED ORDER — ALPRAZOLAM 0.5 MG PO TABS: 0.5000 mg | ORAL_TABLET | Freq: Three times a day (TID) | ORAL | 2 refills | Status: DC | PRN

## 2019-08-04 NOTE — Progress Notes (Signed)
Virtual Visit via Telephone Note   This visit type was conducted due to national recommendations for restrictions regarding the COVID-19 Pandemic (e.g. social distancing) in an effort to limit this patient's exposure and mitigate transmission in our community.  Due to his co-morbid illnesses, this patient is at least at moderate risk for complications without adequate follow up.  This format is felt to be most appropriate for this patient at this time.  The patient did not have access to video technology/had technical difficulties with video requiring transitioning to audio format only (telephone).  All issues noted in this document were discussed and addressed.  No physical exam could be performed with this format.  Please refer to the patient's chart for his  consent to telehealth for [facility].   Date:  08/04/2019   ID:  [name], DOB [id number], MRN [id number]  Patient Location: Home Provider Location: Office  PCP:  Mikey Kirschner, MD  Cardiologist:  Kate Sable, MD  Electrophysiologist:  None   Evaluation Performed:  Follow-Up Visit  Chief Complaint:  CAD  History of Present Illness:    Jared Griffin is a 57 y.o. male with history of coronary artery disease and underwent coronary angiography most recently on 02/26/2018 which demonstrated a widely patent LAD stent with minimal restenosis. LVEDP was normal as was left ventricular systolic function, LVEF 55 to 65% by visual estimate. There were some 25% lesions in the second diagonal and mid LAD and a 10% stenosis in the mid RCA at that time.  When he's in bed at night, he has some chest pressure and occasional palpitations.  He struggles with anxiety and stress.    Past Medical History:  Diagnosis Date  . Anginal pain ([place])   . Anxiety   . 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 COPD (chronic obstructive pulmonary disease) ([place])    very close to being diagnosed  . Coronary  artery disease   . Coronary atherosclerosis of native coronary artery    a. 12/09/2013: s/p PCI with 3.0 x 28 Promus DES to mLAD  . Cyst (solitary) of breast 10/03/2017   removed from back  . Depression   . Dyspnea    take inhaler  . Elbow injury 07/25/2017   surgery for torn tendon  . Essential hypertension, benign   . Fatty liver disease, nonalcoholic   . Lateral epicondylitis of right elbow   . Mixed hyperlipidemia 2006  . Obesity   . Osgood-Schlatter's disease of right knee   . Type 2 diabetes mellitus ([place])    Past Surgical History:  Procedure Laterality Date  . BIOPSY  01/21/2019   Procedure: BIOPSY;  Surgeon: [name], MD;  [facility];  Service: Endoscopy;;  gastric bx and gastric polyps  . CARDIAC CATHETERIZATION  2011  . CARDIAC CATHETERIZATION N/A 09/20/2015   Procedure: Left Heart Cath and Coronary Angiography;  Surgeon: [name], MD;  Location: [place] [facility];  Service: Cardiovascular;  Laterality: N/A;  . CHOLECYSTECTOMY  2003  . COLONOSCOPY  06/19/2012   Procedure: COLONOSCOPY;  Surgeon: [name], MD;  [facility]: [facility];  Service: Endoscopy;  Laterality: N/A;  930  . COLONOSCOPY N/A 10/17/2017   Procedure: COLONOSCOPY;  Surgeon: [name], MD;  [facility];  Service: Endoscopy;  Laterality: N/A;  1225  . CYST EXCISION  07/2019  . ESOPHAGOGASTRODUODENOSCOPY (EGD) WITH PROPOFOL N/A 01/21/2019   Procedure: ESOPHAGOGASTRODUODENOSCOPY (EGD) WITH  PROPOFOL;  Surgeon: [name], MD;  [facility];  Service: Endoscopy;  Laterality: N/A;  10:15am  . KNEE ARTHROPLASTY Right 1999  . LEFT HEART CATH AND CORONARY ANGIOGRAPHY N/A 02/26/2018   Procedure: LEFT HEART CATH AND CORONARY ANGIOGRAPHY;  Surgeon: [name], MD;  Location: [place] [facility];  Service: Cardiovascular;  Laterality: N/A;  . LEFT HEART CATHETERIZATION WITH CORONARY ANGIOGRAM N/A 12/09/2013   Procedure: LEFT HEART  CATHETERIZATION WITH CORONARY ANGIOGRAM;  Surgeon: [name], MD;  Location: [facility];  Service: Cardiovascular;  Laterality: N/A;  . Liver biopsy    . POLYPECTOMY  10/17/2017   Procedure: POLYPECTOMY;  Surgeon: [name], MD;  [facility];  Service: Endoscopy;;  ascending colon, splenic flexure,sigmoid x2  . TENNIS ELBOW RELEASE/NIRSCHEL PROCEDURE Right 07/25/2017   Procedure: TENNIS ELBOW RELEASE and debriedment;  Surgeon: [name], MD;  Location: [facility];  Service: Orthopedics;  Laterality: Right;  . TONSILLECTOMY  1970's     Current Meds  Medication Sig  . albuterol (VENTOLIN HFA) 108 (90 Base) MCG/ACT inhaler INHALE 2 PUFFS INTO LUNGS EVERY 6 HOURS AS NEEDED FOR WHEEZING AND SHORTNESS OF BREATH.  Marland Kitchen ALPRAZolam (XANAX) 0.5 MG tablet Take 1 tablet (0.5 mg total) by mouth 3 (three) times daily as needed for sleep or anxiety.  Marland Kitchen aspirin (ASPIR-LOW) 81 MG EC tablet Take 1 tablet (81 mg total) by mouth daily.  Marland Kitchen atorvastatin (LIPITOR) 40 MG tablet TAKE (1) TABLET BY MOUTH ONCE DAILY.  . celecoxib (CELEBREX) 200 MG capsule Take 1 capsule (200 mg total) by mouth 2 (two) times daily.  . diclofenac (CATAFLAM) 50 MG tablet Take 1 tablet (50 mg total) by mouth 2 (two) times daily.  Marland Kitchen diltiazem (CARDIZEM) 30 MG tablet TAKE (1) TABLET BY MOUTH TWICE DAILY.  . fluticasone (FLOVENT HFA) 110 MCG/ACT inhaler Inhale 2 puffs into the lungs 2 (two) times a day.  . gabapentin (NEURONTIN) 300 MG capsule Take 1 capsule (300 mg total) by mouth 4 (four) times daily.  Marland Kitchen glipiZIDE (GLUCOTROL XL) 5 MG 24 hr tablet TAKE (2) TABLETS BY MOUTH ONCE DAILY WITH BREAKFAST.  Marland Kitchen Icosapent Ethyl (VASCEPA) 1 g CAPS Take 2 capsules (2 g total) by mouth 2 (two) times a day.  . Lancets MISC 1 each by Does not apply route 4 (four) times daily.  Marland Kitchen lidocaine (LIDODERM) 5 % Place 1 patch onto the skin every 12 (twelve) hours. Remove & Discard patch within 12 hours or as directed by MD  . losartan  (COZAAR) 25 MG tablet TAKE (1) TABLET BY MOUTH ONCE DAILY.  . meclizine (ANTIVERT) 25 MG tablet Take 1 tablet (25 mg total) by mouth 3 (three) times daily as needed for dizziness.  . metFORMIN (GLUCOPHAGE) 500 MG tablet TAKE 2 TABLETS IN THE MORNING, 1 TABLET AT NOON, AND 2 TABLETS IN THEEVENING. (Patient taking differently: Give w/food. Patient takes 2 in the morning and 2 at supper.)  . nitroGLYCERIN (NITROSTAT) 0.4 MG SL tablet Place 1 tablet (0.4 mg total) under the tongue every 5 (five) minutes as needed for chest pain.  Glory Rosebush ULTRA test strip TESTING 4 TIMES DAILY.  . pantoprazole (PROTONIX) 40 MG tablet Take one daily  . ranolazine (RANEXA) 1000 MG SR tablet TAKE (1) TABLET BY MOUTH TWICE DAILY.  Marland Kitchen sertraline (ZOLOFT) 100 MG tablet Take 1.5 tablets (150 mg total) by mouth daily.  . TOUJEO MAX SOLOSTAR 300 UNIT/ML SOPN INJECT 120 UNITS INTO  THE SKIN AT BEDTIME.  . traZODone (DESYREL) 50 MG tablet Take 1 tablet (50 mg total) by mouth at bedtime.     Allergies:   Metoprolol   Social History   Tobacco Use  . Smoking status: Former Smoker    Packs/day: 0.50    Years: 10.00    Pack years: 5.00    Types: Cigarettes    Start date: 02/02/1973    Quit date: 09/17/1983    Years since quitting: 35.9  . Smokeless tobacco: Former Systems developer    Types: [name] date: 09/17/1983  Substance Use Topics  . Alcohol use: No    Alcohol/week: 0.0 standard drinks  . Drug use: No     Family Hx: The patient's family history includes CVA in his maternal grandfather and maternal grandmother; Cancer in his maternal aunt.  ROS:   Please see the history of present illness.     All other systems reviewed and are negative.   Prior CV studies:   The following studies were reviewed today:  Reviewed above  Labs/Other Tests and Data Reviewed:    EKG:  No ECG reviewed.  Recent Labs: 01/05/2019: Hemoglobin 14.8; Platelets 151 04/15/2019: ALT 49; BUN 13; Creatinine, Ser 0.90; Potassium 3.9;  Sodium 141   Recent Lipid Panel Lab Results  Component Value Date/Time   CHOL 116 10/18/2018 09:03 AM   TRIG 173 (H) 10/18/2018 09:03 AM   HDL 30 (L) 10/18/2018 09:03 AM   CHOLHDL 3.9 10/18/2018 09:03 AM   CHOLHDL 6.4 07/12/2018 07:13 AM   LDLCALC 51 10/18/2018 09:03 AM    Wt Readings from Last 3 Encounters:  08/04/19 250 lb (113.4 kg)  06/08/19 259 lb (117.5 kg)  05/04/19 257 lb 6.4 oz (116.8 kg)     Objective:    Vital Signs:  Ht 6' 2"  (1.88 m)   Wt 250 lb (113.4 kg)   BMI 32.10 kg/m    VITAL SIGNS:  reviewed  ASSESSMENT & PLAN:    1.  Coronary artery disease: Symptomatically stable.  Cardiac catheterization results from June 2019 reviewed above.  Continue aspirin, atorvastatin and Ranexa.  I increased Ranexa to 1000 mg twice daily on 01/05/2019.  He has an elevated HbA1c and may have microvascular angina.  He also has a history of anxiety and depression.   2.  Hypertension: No changes to therapy.  3.  Hyperlipidemia: Presently on atorvastatin 40 mg. LDL 51 on 10/18/2018.  4. Type 2 diabetes mellitus: A1c elevated at 8.9% on 7/29//2020.  This is being managed by endocrinology. He likes to snack late at night.   COVID-19 Education: The signs and symptoms of COVID-19 were discussed with the patient and how to seek care for testing (follow up with PCP or arrange E-visit).  The importance of social distancing was discussed today.  Time:   Today, I have spent 5 minutes with the patient with telehealth technology discussing the above problems.     Medication Adjustments/Labs and Tests Ordered: Current medicines are reviewed at length with the patient today.  Concerns regarding medicines are outlined above.   Tests Ordered: No orders of the defined types were placed in this encounter.   Medication Changes: No orders of the defined types were placed in this encounter.   Follow Up:  Either In Person or Virtual in 1 year(s)  Signed, Kate Sable, MD   08/04/2019 9:04 AM    [facility]

## 2019-08-04 NOTE — Progress Notes (Signed)
Virtual Visit via Telephone Note  I connected with Jared Griffin on 08/04/19 at  9:00 AM EST by telephone and verified that I am speaking with the correct person using two identifiers.   I discussed the limitations, risks, security and privacy concerns of performing an evaluation and management service by telephone and the availability of in person appointments. I also discussed with the patient that there may be a patient responsible charge related to this service. The patient expressed understanding and agreed to proceed.      I discussed the assessment and treatment plan with the patient. The patient was provided an opportunity to ask questions and all were answered. The patient agreed with the plan and demonstrated an understanding of the instructions.   The patient was advised to call back or seek an in-person evaluation if the symptoms worsen or if the condition fails to improve as anticipated.  I provided 15 minutes of non-face-to-face time during this encounter.   [name], MD  [facility] MD/PA/NP OP Progress Note  08/04/2019 9:31 AM Jared Griffin  MRN:  [id number]  Chief Complaint:  Chief Complaint    Depression; Anxiety; Follow-up     HPI: This patient is a 58 year old married white male lives with his wife and 10 year old son in [place]. 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 zone  The patient was referred by his primary physician, Dr. [name], for further assessment and treatment of anxiety and depression.  The patient states that he is become increasingly depressed and anxious over the last couple of years due to work stress. He stated that he was always under pressure to get more things done than he had time to do. 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 He got to the point of hating going to work. Several years ago he had cardiac problems and had a stent placed. In January of this year he began developing dizzy spells chest pain headaches stomach aches. It got so bad that he was readmitted for cardiac catheterization but nothing new was found.  The patient was having difficulty sleeping constant worry and anxiety about his job sadness and anger. He was getting irritable with people at home. After talking to his primary care physician at length and decided to take him out on medical retirement. The patient has been on Wellbutrin SR 150 no gram twice a day for several weeks now. He had also been out in the past when his wife was sick. He has not seen any benefit from this but does feel tremendously better since he stopped working. He is also on Xanax 0.5 mg 3 times a day which he thinks has helped to some degree.  Since stopping working he's had a big turnaround. His sleep and energy have improved. 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 He is much less anxious and is no longer having the chest pain dizzy spells or stomachaches. He denies suicidal ideation or psychotic symptoms. He does not use drugs or alcohol  The patient returns for follow-up after 3 months.  He states he has had a lot of deaths in his immediate circle.  He lost a cousin to cancer and another friend from church died from heart disease.  Both his parents  had coronavirus.  His cousin is in the hospital with coronavirus and not is currently in a coma.  Nevertheless he states that he is trying to stay positive.  The increase in the Zoloft a few months ago has really helped.  He is sleeping well most of the time.  Now that it is cooler out he is able to get outside and move around more.  He still has a lot of arm pain from an injury at work.  The Workmen's Comp. is not paying for immediate treatments according to to the patient.  He really needs to have a nerve  block put in. Visit Diagnosis:    ICD-10-CM   1. Moderate single current episode of major depressive disorder ([place])  F32.1     Past Psychiatric History: none  Past Medical History:  Past Medical History:  Diagnosis Date  . Anginal pain ([place])   . Anxiety   . 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 COPD (chronic obstructive pulmonary disease) ([place])    very close to being diagnosed  . Coronary artery disease   . Coronary atherosclerosis of native coronary artery    a. 12/09/2013: s/p PCI with 3.0 x 28 Promus DES to mLAD  . Cyst (solitary) of breast 10/03/2017   removed from back  . Depression   . Dyspnea    take inhaler  . Elbow injury 07/25/2017   surgery for torn tendon  . Essential hypertension, benign   . Fatty liver disease, nonalcoholic   . Lateral epicondylitis of right elbow   . Mixed hyperlipidemia 2006  . Obesity   . Osgood-Schlatter's disease of right knee   . Type 2 diabetes mellitus ([place])     Past Surgical History:  Procedure Laterality Date  . BIOPSY  01/21/2019   Procedure: BIOPSY;  Surgeon: [name], MD;  [facility];  Service: Endoscopy;;  gastric bx and gastric polyps  . CARDIAC CATHETERIZATION  2011  . CARDIAC CATHETERIZATION N/A 09/20/2015   Procedure: Left Heart Cath and Coronary Angiography;  Surgeon: [name], MD;  Location: [place] CV LAB;  Service: Cardiovascular;  Laterality: N/A;  . CHOLECYSTECTOMY  2003  . COLONOSCOPY  06/19/2012   Procedure: COLONOSCOPY;  Surgeon: [name], MD;  [facility];  Service: Endoscopy;  Laterality: N/A;  930  . COLONOSCOPY N/A 10/17/2017   Procedure: COLONOSCOPY;  Surgeon: [name], MD;  [facility];  Service: Endoscopy;  Laterality: N/A;  1225  . CYST EXCISION  07/2019  . ESOPHAGOGASTRODUODENOSCOPY (EGD) WITH PROPOFOL N/A 01/21/2019   Procedure: ESOPHAGOGASTRODUODENOSCOPY (EGD) WITH PROPOFOL;  Surgeon: [name], MD;  [facility]  [facility];  Service: Endoscopy;  Laterality: N/A;  10:15am  . KNEE ARTHROPLASTY Right 1999  . LEFT HEART CATH AND CORONARY ANGIOGRAPHY N/A 02/26/2018   Procedure: LEFT HEART CATH AND CORONARY ANGIOGRAPHY;  Surgeon: [name], MD;  Location: [place] [facility];  Service: Cardiovascular;  Laterality: N/A;  . LEFT HEART CATHETERIZATION WITH CORONARY ANGIOGRAM N/A 12/09/2013   Procedure: LEFT HEART CATHETERIZATION WITH CORONARY ANGIOGRAM;  Surgeon: [name], MD;  Location: [facility] CATH LAB;  Service: Cardiovascular;  Laterality: N/A;  . Liver biopsy    . POLYPECTOMY  10/17/2017   Procedure: POLYPECTOMY;  Surgeon: [name], MD;  [facility];  Service: Endoscopy;;  ascending colon, splenic flexure,sigmoid x2  . TENNIS ELBOW RELEASE/NIRSCHEL PROCEDURE Right 07/25/2017   Procedure: TENNIS  ELBOW RELEASE and debriedment;  Surgeon: [name], MD;  Location: [facility] ORS;  Service: Orthopedics;  Laterality: Right;  . TONSILLECTOMY  1970's    Family Psychiatric History:none  Family History:  Family History  Problem Relation Age of Onset  . Cancer Maternal Aunt   . CVA Maternal Grandmother   . CVA Maternal Grandfather     Social History:  Social History   Socioeconomic History  . Marital status: Married    Spouse name: Not on file  . Number of children: Not on file  . Years of education: Not on file  . Highest education level: Not on file  Occupational History  . Not on file  Social Needs  . Financial resource strain: Not hard at all  . Food insecurity    Worry: Never true    Inability: Never true  . Transportation needs    Medical: No    Non-medical: No  Tobacco Use  . Smoking status: Former Smoker    Packs/day: 0.50    Years: 10.00    Pack years: 5.00    Types: Cigarettes    Start date: 02/02/1973    Quit date: 09/17/1983    Years since quitting: 35.9  . Smokeless tobacco: Former Systems developer    Types: [place] date: 09/17/1983  Substance  and Sexual Activity  . Alcohol use: No    Alcohol/week: 0.0 standard drinks  . Drug use: No  . Sexual activity: Yes  Lifestyle  . Physical activity    Days per week: Patient refused    Minutes per session: Patient refused  . Stress: To some extent  Relationships  . Social Herbalist on phone: Patient refused    Gets together: Patient refused    Attends religious service: Patient refused    Active member of club or organization: Patient refused    Attends meetings of clubs or organizations: Patient refused    Relationship status: Patient refused  Other Topics Concern  . 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    Married with 1 child   Right handed    12 th    Very little caffeine    Allergies:  Allergies  Allergen Reactions  . Metoprolol Rash    Metabolic Disorder Labs: Lab Results  Component Value Date   HGBA1C 8.9 (H) 04/15/2019   MPG 211.6 07/12/2018   MPG 197.25 07/23/2017   No results found for: PROLACTIN Lab Results  Component Value Date   CHOL 116 10/18/2018   TRIG 173 (H) 10/18/2018   HDL 30 (L) 10/18/2018   CHOLHDL 3.9 10/18/2018   VLDL UNABLE TO CALCULATE IF TRIGLYCERIDE OVER 400 mg/dL 07/12/2018   LDLCALC 51 10/18/2018   LDLCALC UNABLE TO CALCULATE IF TRIGLYCERIDE OVER 400 mg/dL 07/12/2018   Lab Results  Component Value Date   TSH 0.525 07/12/2018   TSH 0.458 04/13/2017    Therapeutic Level Labs: No results found for: LITHIUM No results found for: VALPROATE No components found for:  CBMZ  Current Medications: Current Outpatient Medications  Medication Sig Dispense Refill  . albuterol (VENTOLIN HFA) 108 (90 Base) MCG/ACT inhaler INHALE 2 PUFFS INTO LUNGS EVERY 6 HOURS AS NEEDED FOR WHEEZING AND SHORTNESS OF BREATH. 8.5 g 0  . ALPRAZolam (XANAX) 0.5 MG tablet Take 1 tablet (0.5 mg total) by mouth 3 (three) times daily as needed for sleep or  anxiety. 90 tablet 2  . aspirin (ASPIR-LOW) 81 MG EC tablet Take 1 tablet (81 mg total) by mouth daily. 30 tablet 12  . atorvastatin (LIPITOR) 40 MG tablet TAKE (1) TABLET BY MOUTH ONCE DAILY. 90 tablet 0  . celecoxib (CELEBREX) 200 MG capsule Take 1 capsule (200 mg total) by mouth 2 (two) times daily. 14 capsule 0  . diclofenac (CATAFLAM) 50 MG tablet Take 1 tablet (50 mg total) by mouth 2 (two) times daily. 90 tablet 3  . diltiazem (CARDIZEM) 30 MG tablet TAKE (1) TABLET BY MOUTH TWICE DAILY. 180 tablet 1  . fluticasone (FLOVENT HFA) 110 MCG/ACT inhaler Inhale 2 puffs into the lungs 2 (two) times a day. 1 Inhaler 5  . gabapentin (NEURONTIN) 300 MG capsule Take 1 capsule (300 mg total) by mouth 4 (four) times daily. 120 capsule 5  . glipiZIDE (GLUCOTROL XL) 5 MG 24 hr tablet TAKE (2) TABLETS BY MOUTH ONCE DAILY WITH BREAKFAST. 180 tablet 0  . Icosapent Ethyl (VASCEPA) 1 g CAPS Take 2 capsules (2 g total) by mouth 2 (two) times a day. 120 capsule 11  . Lancets MISC 1 each by Does not apply route 4 (four) times daily. 150 each 5  . lidocaine (LIDODERM) 5 % Place 1 patch onto the skin every 12 (twelve) hours. Remove & Discard patch within 12 hours or as directed by MD 30 patch 0  . losartan (COZAAR) 25 MG tablet TAKE (1) TABLET BY MOUTH ONCE DAILY. 90 tablet 0  . meclizine (ANTIVERT) 25 MG tablet Take 1 tablet (25 mg total) by mouth 3 (three) times daily as needed for dizziness. 30 tablet 0  . metFORMIN (GLUCOPHAGE) 500 MG tablet TAKE 2 TABLETS IN THE MORNING, 1 TABLET AT NOON, AND 2 TABLETS IN THEEVENING. (Patient taking differently: Give w/food. Patient takes 2 in the morning and 2 at supper.) 450 tablet 0  . nitroGLYCERIN (NITROSTAT) 0.4 MG SL tablet Place 1 tablet (0.4 mg total) under the tongue every 5 (five) minutes as needed for chest pain. 25 tablet 3  . ONETOUCH ULTRA test strip TESTING 4 TIMES DAILY. 150 each 5  . pantoprazole (PROTONIX) 40 MG tablet Take one daily 90 tablet 1  . ranolazine  (RANEXA) 1000 MG SR tablet TAKE (1) TABLET BY MOUTH TWICE DAILY. 60 tablet 3  . sertraline (ZOLOFT) 100 MG tablet Take 1.5 tablets (150 mg total) by mouth daily. 135 tablet 2  . TOUJEO MAX SOLOSTAR 300 UNIT/ML SOPN INJECT 120 UNITS INTO THE SKIN AT BEDTIME. 12 mL 0  . traZODone (DESYREL) 50 MG tablet Take 1 tablet (50 mg total) by mouth at bedtime. 30 tablet 2   No current facility-administered medications for this visit.      Musculoskeletal: Strength & Muscle Tone: within normal limits Gait & Station: normal Patient leans: N/A  Psychiatric Specialty Exam: Review of Systems  Musculoskeletal: Positive for joint pain.  All other systems reviewed and are negative.   There were no vitals taken for this visit.There is no height or weight on file to calculate BMI.  General Appearance: Casual and Fairly Groomed  Eye Contact:  Good  Speech:  Clear and Coherent  Volume:  Normal  Mood:  Euthymic  Affect:  Appropriate and Congruent  Thought Process:  Goal Directed  Orientation:  Full (Time, Place, and Person)  Thought Content: Rumination   Suicidal Thoughts:  No  Homicidal Thoughts:  No  Memory:  Immediate;   Good Recent;   Good Remote;  Fair  Judgement:  Good  Insight:  Good  Psychomotor Activity:  Normal  Concentration:  Concentration: Good and Attention Span: Good  Recall:  Good  Fund of Knowledge: Good  Language: Good  Akathisia:  No  Handed:  Right  AIMS (if indicated): not done  Assets:  Communication Skills Desire for Improvement Resilience Social Support Talents/Skills  ADL's:  Intact  Cognition: WNL  Sleep:  Good   Screenings: GAD-7     Office Visit from 08/05/2018 in [facility]  Total GAD-7 Score  5    PHQ2-9     Nutrition from 08/06/2018 in [facility] Office Visit from 08/05/2018 in [place] Office Visit from 07/24/2018 in [facility] Office Visit from  12/02/2017 in [place] Office Visit from 08/02/2017 in [place]  PHQ-2 Total Score  0  1  0  0  0  PHQ-9 Total Score  -  3  -  1  -       Assessment and Plan: This patient is a 58 year old male with a history of significant depression and anxiety.  He seems to be doing better on the current regimen.  He will continue Zoloft 150 mg daily for depression, trazodone 50 mg at bedtime as needed for sleep and Xanax 0.5 mg 3 times daily as needed for anxiety.  He will return to see me in 3 months.   [name], MD 08/04/2019, 9:31 AM

## 2019-08-04 NOTE — Patient Instructions (Signed)
Medication Instructions:  Your physician recommends that you continue on your current medications as directed. Please refer to the Current Medication list given to you today.  *If you need a refill on your cardiac medications before your next appointment, please call your pharmacy*  Lab Work: None today If you have labs (blood work) drawn today and your tests are completely normal, you will receive your results only by: Marland Kitchen MyChart Message (if you have MyChart) OR . A paper copy in the mail If you have any lab test that is abnormal or we need to change your treatment, we will call you to review the results.  Testing/Procedures: None today  Follow-Up: At [facility], you and your health needs are our priority.  As part of our continuing mission to provide you with exceptional heart care, we have created designated Provider Care Teams.  These Care Teams include your primary Cardiologist (physician) and Advanced Practice Providers (APPs -  Physician Assistants and Nurse Practitioners) who all work together to provide you with the care you need, when you need it.  Your next appointment:   1 year  The format for your next appointment:   In person  Provider:   Dr.Koneswarean  Other Instructions None      Thank you for choosing [facility] !

## 2019-08-05 LAB — HM DIABETES EYE EXAM

## 2019-08-10 ENCOUNTER — Ambulatory Visit (INDEPENDENT_AMBULATORY_CARE_PROVIDER_SITE_OTHER): Payer: PRIVATE HEALTH INSURANCE | Admitting: Family Medicine

## 2019-08-10 ENCOUNTER — Other Ambulatory Visit: Payer: Self-pay

## 2019-08-10 ENCOUNTER — Encounter: Payer: Self-pay | Admitting: Family Medicine

## 2019-08-10 VITALS — BP 132/88 | Temp 97.8°F | Ht 74.5 in | Wt 262.0 lb

## 2019-08-10 DIAGNOSIS — J4540 Moderate persistent asthma, uncomplicated: Secondary | ICD-10-CM

## 2019-08-10 DIAGNOSIS — I10 Essential (primary) hypertension: Secondary | ICD-10-CM

## 2019-08-10 DIAGNOSIS — Z23 Encounter for immunization: Secondary | ICD-10-CM | POA: Diagnosis not present

## 2019-08-10 DIAGNOSIS — Z0000 Encounter for general adult medical examination without abnormal findings: Secondary | ICD-10-CM

## 2019-08-10 DIAGNOSIS — Z125 Encounter for screening for malignant neoplasm of prostate: Secondary | ICD-10-CM | POA: Diagnosis not present

## 2019-08-10 MED ORDER — GABAPENTIN 300 MG PO CAPS: 300.0000 mg | ORAL_CAPSULE | Freq: Four times a day (QID) | ORAL | 5 refills | Status: DC

## 2019-08-10 MED ORDER — DILTIAZEM HCL 30 MG PO TABS: ORAL_TABLET | ORAL | 1 refills | Status: DC

## 2019-08-10 MED ORDER — PANTOPRAZOLE SODIUM 40 MG PO TBEC: DELAYED_RELEASE_TABLET | ORAL | 1 refills | Status: DC

## 2019-08-10 MED ORDER — ALBUTEROL SULFATE HFA 108 (90 BASE) MCG/ACT IN AERS: INHALATION_SPRAY | RESPIRATORY_TRACT | 5 refills | Status: DC

## 2019-08-10 NOTE — Progress Notes (Signed)
Subjective:    Patient ID: Jared Griffin, male    DOB: 23-Jul-1961, 58 y.o.   MRN: [id number]  HPI The patient comes in today for a wellness visit.    A review of their health history was completed.  A review of medications was also completed.  Any needed refills; none  Eating habits: tries to eat healthy  Falls/  MVA accidents in past few months: none  Regular exercise: a little bit of walking  Specialist pt sees on regular basis: sees dr nida for diabetes, dr Raliegh Ip for heart, sees foot doctor, dr [name] for arthritis in back. Sees dr Harrington Challenger  Preventative health issues were discussed.   Additional concerns:  headaches for about 6 months. Pt advised he would need another visit for the headaches.   Needs albuterol inhaler changed to something else that insurance will cover.   Uses resue inhaler about three times per week  Wheezing with the heat in the summer   Now exerciing more   Blood pressure medicine and blood pressure levels reviewed today with patient. Compliant with blood pressure medicine. States does not miss a dose. No obvious side effects. Blood pressure generally good when checked elsewhere. Watching salt intake.  Asthma clinically stable.  Uses rescue inhaler several times per week.  Compliant with the Flovent.  Followed by Dr. Dorris Fetch for his diabetes.  Has not had a diabetic foot exam for quite some time  Review of Systems  Constitutional: Negative for activity change, appetite change and fever.  HENT: Negative for congestion and rhinorrhea.   Eyes: Negative for discharge.  Respiratory: Negative for cough and wheezing.   Cardiovascular: Negative for chest pain.  Gastrointestinal: Negative for abdominal pain, blood in stool and vomiting.  Genitourinary: Negative for difficulty urinating and frequency.  Musculoskeletal: Negative for neck pain.  Skin: Negative for rash.  Allergic/Immunologic: Negative for environmental allergies and food allergies.   Neurological: Negative for weakness and headaches.  Psychiatric/Behavioral: Negative for agitation.  All other systems reviewed and are negative.      Objective:   Physical Exam Vitals signs reviewed.  Constitutional:      Appearance: He is well-developed.  HENT:     Head: Normocephalic and atraumatic.     Right Ear: External ear normal.     Left Ear: External ear normal.     Nose: Nose normal.  Eyes:     Pupils: Pupils are equal, round, and reactive to light.  Neck:     Musculoskeletal: Normal range of motion and neck supple.     Thyroid: No thyromegaly.  Cardiovascular:     Rate and Rhythm: Normal rate and regular rhythm.     Heart sounds: Normal heart sounds. No murmur.  Pulmonary:     Effort: Pulmonary effort is normal. No respiratory distress.     Breath sounds: Normal breath sounds. No wheezing.  Abdominal:     General: Bowel sounds are normal. There is no distension.     Palpations: Abdomen is soft. There is no mass.     Tenderness: There is no abdominal tenderness.  Genitourinary:    Penis: Normal.   Musculoskeletal: Normal range of motion.  Lymphadenopathy:     Cervical: No cervical adenopathy.  Skin:    General: Skin is warm and dry.     Findings: No erythema.  Neurological:     Mental Status: He is alert.     Motor: No abnormal muscle tone.  Psychiatric:  Behavior: Behavior normal.        Judgment: Judgment normal.     Prostate exam within normal limits      Assessment & Plan:  Impression 1 wellness. Saw an eye doc  This SmartLink has not been configured with any valid records.   wk, no retinopathy, slight change in eye. Colon done in 2019at leawt three yrs to do  Diet exercise discussed flu shot today appropriate screening blood work encouraged to do PSA  2.  Hypertension good control discussed maintain same meds  3.  Asthma clinically stable.  Maintain same meds.  Will choose generic appropriate with patient's insurance  Follow-up in  6 months  Continue to interact with all specialists

## 2019-08-12 ENCOUNTER — Other Ambulatory Visit: Payer: Self-pay | Admitting: "Endocrinology

## 2019-08-12 NOTE — Telephone Encounter (Signed)
Pt has been taking MTF differently. He is requesting a refill for 5 MTF daily.

## 2019-08-18 ENCOUNTER — Encounter: Payer: Self-pay | Admitting: Family Medicine

## 2019-08-20 ENCOUNTER — Other Ambulatory Visit: Payer: Self-pay | Admitting: "Endocrinology

## 2019-08-20 LAB — VITAMIN D 25 HYDROXY (VIT D DEFICIENCY, FRACTURES): Vit D, 25-Hydroxy: 18.6

## 2019-08-20 LAB — TSH: TSH: 0.65 (ref 0.41–5.90)

## 2019-08-20 LAB — HEMOGLOBIN A1C: Hemoglobin A1C: 9.1

## 2019-08-21 LAB — VITAMIN D 25 HYDROXY (VIT D DEFICIENCY, FRACTURES): Vit D, 25-Hydroxy: 18.6 ng/mL — ABNORMAL LOW (ref 30.0–100.0)

## 2019-08-21 LAB — COMPREHENSIVE METABOLIC PANEL
ALT: 53 IU/L — ABNORMAL HIGH (ref 0–44)
AST: 31 IU/L (ref 0–40)
Albumin/Globulin Ratio: 1.9 (ref 1.2–2.2)
Albumin: 4.6 g/dL (ref 3.8–4.9)
Alkaline Phosphatase: 52 IU/L (ref 39–117)
BUN/Creatinine Ratio: 17 (ref 9–20)
BUN: 16 mg/dL (ref 6–24)
Bilirubin Total: 0.6 mg/dL (ref 0.0–1.2)
CO2: 25 mmol/L (ref 20–29)
Calcium: 9.6 mg/dL (ref 8.7–10.2)
Chloride: 98 mmol/L (ref 96–106)
Creatinine, Ser: 0.93 mg/dL (ref 0.76–1.27)
GFR calc Af Amer: 105 mL/min/{1.73_m2} (ref >59)
GFR calc non Af Amer: 91 mL/min/{1.73_m2} (ref >59)
Globulin, Total: 2.4 g/dL (ref 1.5–4.5)
Glucose: 184 mg/dL — ABNORMAL HIGH (ref 65–99)
Potassium: 4.1 mmol/L (ref 3.5–5.2)
Sodium: 139 mmol/L (ref 134–144)
Total Protein: 7 g/dL (ref 6.0–8.5)

## 2019-08-21 LAB — MICROALBUMIN / CREATININE URINE RATIO
Creatinine, Urine: 146.1 mg/dL
Microalb/Creat Ratio: 11 mg/g creat (ref 0–29)
Microalbumin, Urine: 15.7 ug/mL

## 2019-08-21 LAB — T4, FREE: Free T4: 1.35 ng/dL (ref 0.82–1.77)

## 2019-08-21 LAB — PSA: Prostate Specific Ag, Serum: 0.3 ng/mL (ref 0.0–4.0)

## 2019-08-21 LAB — TSH: TSH: 0.648 u[IU]/mL (ref 0.450–4.500)

## 2019-08-21 LAB — HGB A1C W/O EAG: Hgb A1c MFr Bld: 9.1 % — ABNORMAL HIGH (ref 4.8–5.6)

## 2019-08-21 LAB — SPECIMEN STATUS REPORT

## 2019-08-23 ENCOUNTER — Encounter: Payer: Self-pay | Admitting: Family Medicine

## 2019-08-26 ENCOUNTER — Ambulatory Visit (INDEPENDENT_AMBULATORY_CARE_PROVIDER_SITE_OTHER): Payer: PRIVATE HEALTH INSURANCE | Admitting: Orthopedic Surgery

## 2019-08-26 ENCOUNTER — Other Ambulatory Visit: Payer: Self-pay

## 2019-08-26 ENCOUNTER — Encounter: Payer: Self-pay | Admitting: Orthopedic Surgery

## 2019-08-26 ENCOUNTER — Encounter: Payer: Self-pay | Admitting: "Endocrinology

## 2019-08-26 ENCOUNTER — Ambulatory Visit (INDEPENDENT_AMBULATORY_CARE_PROVIDER_SITE_OTHER): Payer: PRIVATE HEALTH INSURANCE | Admitting: "Endocrinology

## 2019-08-26 VITALS — BP 140/81 | HR 98 | Ht 74.5 in | Wt 250.0 lb

## 2019-08-26 DIAGNOSIS — E782 Mixed hyperlipidemia: Secondary | ICD-10-CM | POA: Diagnosis not present

## 2019-08-26 DIAGNOSIS — E1159 Type 2 diabetes mellitus with other circulatory complications: Secondary | ICD-10-CM

## 2019-08-26 DIAGNOSIS — I10 Essential (primary) hypertension: Secondary | ICD-10-CM

## 2019-08-26 DIAGNOSIS — M5441 Lumbago with sciatica, right side: Secondary | ICD-10-CM

## 2019-08-26 DIAGNOSIS — G8929 Other chronic pain: Secondary | ICD-10-CM

## 2019-08-26 MED ORDER — TOUJEO MAX SOLOSTAR 300 UNIT/ML ~~LOC~~ SOPN: 140.0000 [IU] | PEN_INJECTOR | Freq: Every day | SUBCUTANEOUS | 0 refills | Status: DC

## 2019-08-26 MED ORDER — VITAMIN D3 125 MCG (5000 UT) PO CAPS: 5000.0000 [IU] | ORAL_CAPSULE | Freq: Every day | ORAL | 0 refills | Status: DC

## 2019-08-26 MED ORDER — HYDROCODONE-ACETAMINOPHEN 5-325 MG PO TABS: 1.0000 | ORAL_TABLET | Freq: Four times a day (QID) | ORAL | 0 refills | Status: DC | PRN

## 2019-08-26 MED ORDER — PREDNISONE 10 MG (48) PO TBPK: ORAL_TABLET | Freq: Every day | ORAL | 0 refills | Status: DC

## 2019-08-26 NOTE — Progress Notes (Signed)
08/26/2019, 11:03 AM                                                     Endocrinology Telehealth Visit Follow up Note -During COVID -19 Pandemic  This visit type was conducted due to national recommendations for restrictions regarding the COVID-19 Pandemic  in an effort to limit this patient's exposure and mitigate transmission of the corona virus.  Due to his co-morbid illnesses, Jared Griffin is at  moderate to high risk for complications without adequate follow up.  This format is felt to be most appropriate for him at this time.  I connected with this patient on 08/26/2019   by telephone and verified that I am speaking with the correct person using two identifiers. Jared Griffin, [birth year]. he has verbally consented to this visit. All issues noted in this document were discussed and addressed. The format was not optimal for physical exam.   Subjective:    Patient ID: Jared Griffin, male    DOB: 11-Oct-1960.  Jared Griffin is being engaged in telehealth via telephone in follow-up  for management of currently uncontrolled symptomatic type 2 diabetes, hyperlipidemia, hypertension. PMD:  Mikey Kirschner, MD.   Past Medical History:  Diagnosis Date  . Anginal pain ([place])   . Anxiety   . 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 COPD (chronic obstructive pulmonary disease) ([place])    very close to being diagnosed  . Coronary artery disease   . Coronary atherosclerosis of native coronary artery    a. 12/09/2013: s/p PCI with 3.0 x 28 Promus DES to mLAD  . Cyst (solitary) of breast 10/03/2017   removed from back  . Depression   . Dyspnea    take inhaler  . Elbow injury 07/25/2017   surgery for torn tendon  . Essential hypertension, benign   . Fatty liver disease, nonalcoholic   . Lateral epicondylitis of right elbow   . Mixed hyperlipidemia 2006  . Obesity   . Osgood-Schlatter's disease of right knee   . Type 2 diabetes  mellitus ([place])    Past Surgical History:  Procedure Laterality Date  . BIOPSY  01/21/2019   Procedure: BIOPSY;  Surgeon: [name], MD;  [facility];  Service: Endoscopy;;  gastric bx and gastric polyps  . CARDIAC CATHETERIZATION  2011  . CARDIAC CATHETERIZATION N/A 09/20/2015   Procedure: Left Heart Cath and Coronary Angiography;  Surgeon: [name], MD;  Location: [facility];  Service: Cardiovascular;  Laterality: N/A;  . CHOLECYSTECTOMY  2003  . COLONOSCOPY  06/19/2012   Procedure: COLONOSCOPY;  Surgeon: [name], MD;  [facility];  Service: Endoscopy;  Laterality: N/A;  930  . COLONOSCOPY N/A 10/17/2017   Procedure: COLONOSCOPY;  Surgeon: [name], MD;  [facility];  Service: Endoscopy;  Laterality: N/A;  1225  . CYST EXCISION  07/2019  . ESOPHAGOGASTRODUODENOSCOPY (EGD) WITH PROPOFOL N/A 01/21/2019   Procedure: ESOPHAGOGASTRODUODENOSCOPY (EGD) WITH PROPOFOL;  Surgeon: [name], MD;  [facility];  Service: Endoscopy;  Laterality: N/A;  10:15am  . KNEE ARTHROPLASTY Right 1999  . LEFT HEART CATH AND CORONARY ANGIOGRAPHY N/A 02/26/2018   Procedure: LEFT HEART CATH AND CORONARY ANGIOGRAPHY;  Surgeon: [name], MD;  Location: [facility];  Service: Cardiovascular;  Laterality: N/A;  . LEFT HEART CATHETERIZATION WITH CORONARY ANGIOGRAM N/A 12/09/2013   Procedure: LEFT HEART CATHETERIZATION WITH CORONARY ANGIOGRAM;  Surgeon: [name], MD;  Location: [facility];  Service: Cardiovascular;  Laterality: N/A;  . Liver biopsy    . POLYPECTOMY  10/17/2017   Procedure: POLYPECTOMY;  Surgeon: [name], MD;  [facility];  Service: Endoscopy;;  ascending colon, splenic flexure,sigmoid x2  . TENNIS ELBOW RELEASE/NIRSCHEL PROCEDURE Right 07/25/2017   Procedure: TENNIS ELBOW RELEASE and debriedment;  Surgeon: [name], MD;  Location: [facility];  Service:  Orthopedics;  Laterality: Right;  . TONSILLECTOMY  1970's   Social History   Socioeconomic History  . Marital status: Married    Spouse name: Not on file  . Number of children: Not on file  . Years of education: Not on file  . Highest education level: Not on file  Occupational History  . Not on file  Social Needs  . Financial resource strain: Not hard at all  . Food insecurity    Worry: Never true    Inability: Never true  . Transportation needs    Medical: No    Non-medical: No  Tobacco Use  . Smoking status: Former Smoker    Packs/day: 0.50    Years: 10.00    Pack years: 5.00    Types: Cigarettes    Start date: 02/02/1973    Quit date: 09/17/1983    Years since quitting: 35.9  . Smokeless tobacco: Former Systems developer    Types: [place] date: 09/17/1983  Substance and Sexual Activity  . Alcohol use: No    Alcohol/week: 0.0 standard drinks  . Drug use: No  . Sexual activity: Yes  Lifestyle  . Physical activity    Days per week: Patient refused    Minutes per session: Patient refused  . Stress: To some extent  Relationships  . Social Herbalist on phone: Patient refused    Gets together: Patient refused    Attends religious service: Patient refused    Active member of club or organization: Patient refused    Attends meetings of clubs or organizations: Patient refused    Relationship status: Patient refused  Other Topics Concern  . 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    Married with 1 child   Right handed    12 th    Very little caffeine   Outpatient Encounter Medications as of 08/26/2019  Medication Sig  . albuterol (VENTOLIN HFA) 108 (90 Base) MCG/ACT inhaler INHALE 2 PUFFS INTO LUNGS EVERY 6 HOURS AS NEEDED FOR WHEEZING AND SHORTNESS OF BREATH.  Marland Kitchen ALPRAZolam (XANAX) 0.5 MG tablet Take 1 tablet (0.5 mg total) by mouth 3 (three) times daily as needed for sleep  or anxiety.  Marland Kitchen aspirin (ASPIR-LOW) 81 MG EC tablet Take 1 tablet (81 mg total) by mouth daily.  Marland Kitchen atorvastatin (LIPITOR) 40 MG tablet TAKE (1) TABLET BY MOUTH ONCE DAILY.  . celecoxib (CELEBREX) 200 MG capsule Take 1 capsule (200 mg total) by mouth 2 (two) times daily.  . Cholecalciferol (VITAMIN D3) 125 MCG (5000 UT) CAPS Take 1 capsule (5,000 Units total) by mouth daily.  . diclofenac (CATAFLAM) 50 MG tablet Take 1 tablet (50 mg total) by mouth 2 (two)  times daily.  Marland Kitchen diltiazem (CARDIZEM) 30 MG tablet TAKE (1) TABLET BY MOUTH TWICE DAILY.  . fluticasone (FLOVENT HFA) 110 MCG/ACT inhaler Inhale 2 puffs into the lungs 2 (two) times a day.  . gabapentin (NEURONTIN) 300 MG capsule Take 1 capsule (300 mg total) by mouth 4 (four) times daily.  Marland Kitchen glipiZIDE (GLUCOTROL XL) 5 MG 24 hr tablet TAKE (2) TABLETS BY MOUTH ONCE DAILY WITH BREAKFAST.  Marland Kitchen Icosapent Ethyl (VASCEPA) 1 g CAPS Take 2 capsules (2 g total) by mouth 2 (two) times a day.  . Insulin Glargine, 2 Unit Dial, (TOUJEO MAX SOLOSTAR) 300 UNIT/ML SOPN Inject 140 Units into the skin at bedtime.  . Lancets MISC 1 each by Does not apply route 4 (four) times daily.  Marland Kitchen lidocaine (LIDODERM) 5 % Place 1 patch onto the skin every 12 (twelve) hours. Remove & Discard patch within 12 hours or as directed by MD  . losartan (COZAAR) 25 MG tablet TAKE (1) TABLET BY MOUTH ONCE DAILY.  . meclizine (ANTIVERT) 25 MG tablet Take 1 tablet (25 mg total) by mouth 3 (three) times daily as needed for dizziness.  . metFORMIN (GLUCOPHAGE) 500 MG tablet Take 2 tablets (1,000 mg total) by mouth 2 (two) times daily with a meal.  . nitroGLYCERIN (NITROSTAT) 0.4 MG SL tablet Place 1 tablet (0.4 mg total) under the tongue every 5 (five) minutes as needed for chest pain.  Glory Rosebush ULTRA test strip TESTING 4 TIMES DAILY.  . pantoprazole (PROTONIX) 40 MG tablet Take one daily  . ranolazine (RANEXA) 1000 MG SR tablet TAKE (1) TABLET BY MOUTH TWICE DAILY.  Marland Kitchen sertraline (ZOLOFT)  100 MG tablet Take 1.5 tablets (150 mg total) by mouth daily.  . traZODone (DESYREL) 50 MG tablet Take 1 tablet (50 mg total) by mouth at bedtime.  . [DISCONTINUED] TOUJEO MAX SOLOSTAR 300 UNIT/ML SOPN INJECT 120 UNITS INTO THE SKIN AT BEDTIME.   No facility-administered encounter medications on file as of 08/26/2019.     ALLERGIES: Allergies  Allergen Reactions  . Metoprolol Rash    VACCINATION STATUS: Immunization History  Administered Date(s) Administered  . Influenza,inj,Quad PF,6+ Mos 07/11/2016, 08/02/2017, 08/05/2018, 08/10/2019  . Influenza-Unspecified 06/18/2013, 06/17/2014, 07/06/2015  . Td 01/28/2014    Diabetes He presents for his follow-up diabetic visit. He has type 2 diabetes mellitus. Onset time: He was diagnosed at approximate age of 51 years. His disease course has been worsening. There are no hypoglycemic associated symptoms. Pertinent negatives for hypoglycemia include no confusion, headaches, pallor or seizures. Associated symptoms include polydipsia and polyuria. Pertinent negatives for diabetes include no chest pain, no fatigue, no polyphagia and no weakness. There are no hypoglycemic complications. Symptoms are worsening. Diabetic complications include heart disease. Risk factors for coronary artery disease include diabetes mellitus, dyslipidemia, hypertension, male sex, obesity, sedentary lifestyle and tobacco exposure. Current diabetic treatment includes oral agent (dual therapy) (He is currently on Invokana 300 mg p.o. daily, metformin 500 mg p.o. twice daily, glipizide 5 mg p.o. twice daily.). His weight is increasing steadily. He is following a generally unhealthy diet. When asked about meal planning, he reported none. He has not had a previous visit with a dietitian. He rarely participates in exercise. His home blood glucose trend is increasing steadily. His breakfast blood glucose range is generally 180-200 mg/dl. His bedtime blood glucose range is generally >200  mg/dl. His overall blood glucose range is >200 mg/dl. An ACE inhibitor/angiotensin II receptor blocker is being taken. Eye exam is current.  Hyperlipidemia This is a chronic problem. The problem is uncontrolled (He has severe hypertriglyceridemia at 1157.). Exacerbating diseases include diabetes and obesity. Pertinent negatives include no chest pain, myalgias or shortness of breath. Risk factors for coronary artery disease include diabetes mellitus, dyslipidemia, hypertension, male sex, obesity and a sedentary lifestyle.  Hypertension This is a chronic problem. The current episode started more than 1 year ago. The problem is controlled. Pertinent negatives include no chest pain, headaches, neck pain, palpitations or shortness of breath. Risk factors for coronary artery disease include diabetes mellitus, dyslipidemia, male gender, obesity, sedentary lifestyle and smoking/tobacco exposure. Hypertensive end-organ damage includes CAD/MI.   Review of systems: Limited as above.   Objective:    There were no vitals taken for this visit.  Wt Readings from Last 3 Encounters:  08/10/19 262 lb (118.8 kg)  08/04/19 250 lb (113.4 kg)  06/08/19 259 lb (117.5 kg)     CMP     Component Value Date/Time   NA 139 08/20/2019 0937   K 4.1 08/20/2019 0937   CL 98 08/20/2019 0937   CO2 25 08/20/2019 0937   GLUCOSE 184 (H) 08/20/2019 0937   GLUCOSE 298 (H) 01/04/2019 1016   BUN 16 08/20/2019 0937   CREATININE 0.93 08/20/2019 0937   CREATININE 0.96 12/07/2013 0950   CALCIUM 9.6 08/20/2019 0937   PROT 7.0 08/20/2019 0937   ALBUMIN 4.6 08/20/2019 0937   AST 31 08/20/2019 0937   ALT 53 (H) 08/20/2019 0937   ALKPHOS 52 08/20/2019 0937   BILITOT 0.6 08/20/2019 0937   GFRNONAA 91 08/20/2019 0937   GFRAA 105 08/20/2019 0937     Diabetic Labs (most recent): Lab Results  Component Value Date   HGBA1C 9.1 (H) 08/20/2019   HGBA1C 9.1 08/20/2019   HGBA1C 8.9 (H) 04/15/2019     Lipid Panel ( most  recent) Lipid Panel     Component Value Date/Time   CHOL 116 10/18/2018 0903   TRIG 173 (H) 10/18/2018 0903   HDL 30 (L) 10/18/2018 0903   CHOLHDL 3.9 10/18/2018 0903   CHOLHDL 6.4 07/12/2018 0713   VLDL UNABLE TO CALCULATE IF TRIGLYCERIDE OVER 400 mg/dL 07/12/2018 0713   LDLCALC 51 10/18/2018 0903      Lab Results  Component Value Date   TSH 0.648 08/20/2019   TSH 0.65 08/20/2019   TSH 0.525 07/12/2018   TSH 0.458 04/13/2017   TSH 0.493 02/05/2014   FREET4 1.35 08/20/2019      Assessment & Plan:   1. DM type 2 causing vascular disease ([place])  - Jared Griffin has currently uncontrolled symptomatic type 2 DM since 58 years of age. -He is reporting significantly above target glycemic profile and A1c of 9.1% increasing from 8.9%.  He admits to dietary indiscretion during the holiday week .   -his diabetes is complicated by coronary artery disease, obesity/sedentary life, history of smoking and he remains at a high risk for more acute and chronic complications which include CAD, CVA, CKD, retinopathy, and neuropathy. These are all discussed in detail with him.  - I have counseled him on diet management and weight loss, by adopting a carbohydrate restricted/protein rich diet.  - he  admits there is a room for improvement in his diet and drink choices. -  Suggestion is made for him to avoid simple carbohydrates  from his diet including Cakes, Sweet Desserts / Pastries, Ice Cream, Soda (diet and regular), Sweet Tea, Candies, Chips, Cookies, Sweet Pastries,  Store Bought Juices, Alcohol  in Excess of  1-2 drinks a day, Artificial Sweeteners, Coffee Creamer, and "Sugar-free" Products. This will help patient to have stable blood glucose profile and potentially avoid unintended weight gain.   - I encouraged him to switch to  unprocessed or minimally processed complex starch and increased protein intake (animal or plant source), fruits, and vegetables.  - he is advised to stick to  a routine mealtimes to eat 3 meals  a day and avoid unnecessary snacks ( to snack only to correct hypoglycemia).   - he has been scheduled with Jearld Fenton, RDN, CDE for individualized diabetes education.  - I have approached him with the following individualized plan to manage diabetes and patient agrees:   -He will continue to benefit from basal insulin at a higher dose. -Given his significant above target fasting glycemic profile, he will tolerate higher dose of Toujeo max.  I discussed and increase Toujeo max to 140 units subcutaneously nightly, approached to start monitoring blood glucose 4 times a day-before meals and at bedtime and he will be on a phone visit in 10  days.   If he is found to have significant postprandial hyperglycemia, he will be considered for NovoLog treatment. - he is encouraged to call clinic for blood glucose levels less than 70 or above 200 mg /dl. -He is advised to continue Metformin 1000 mg p.o. twice daily, glipizide 10 mg XL p.o. daily at breakfast.    -Due to severe hypertriglyceridemia,  he is not a suitable candidate for incretin therapy for now-insulin treatment would also help.   - Patient specific target  A1c;  LDL, HDL, Triglycerides, and  Waist Circumference were discussed in detail.  2) BP/HTN: he is advised to home monitor blood pressure and report if > 140/90 on 2 separate readings.  he is advised to continue his current medications including losartan 25 mg p.o. daily with breakfast .  3) Lipids/HPL:   Review of his recent lipid panel showed unknown LDL due to severe hypertriglyceridemia of 1157+. -He is advised to continue atorvastatin 40 mg p.o. nightly ,  he is also on Vascepa 2 g p.o. twice daily.  He is advised to be consistent on his medications.    4)  Weight/Diet: His BMI 33- I discussed with him the fact that loss of 5 - 10% of his  current body weight will have the most impact on his diabetes management.  CDE Consult will be initiated .  Exercise, and detailed carbohydrates information provided  -  detailed on discharge instructions.  5) Chronic Care/Health Maintenance:  -he  is on ACEI/ARB and Statin medications and  is encouraged to initiate and continue to follow up with Ophthalmology, Dentist,  Podiatrist at least yearly or according to recommendations, and advised to   stay away from smoking. I have recommended yearly flu vaccine and pneumonia vaccine at least every 5 years; moderate intensity exercise for up to 150 minutes weekly; and  sleep for at least 7 hours a day.  - I advised patient to maintain close follow up with Mikey Kirschner, MD for primary care needs.  - Patient Care Time Today:  25 min, of which >50% was spent in  counseling and the rest reviewing his  current and  previous labs/studies, previous treatments, his blood glucose readings, and medications' doses and developing a plan for long-term care based on the latest recommendations for standards of care.   Jared Griffin participated in the discussions, expressed understanding, and voiced agreement  with the above plans.  All questions were answered to his satisfaction. he is encouraged to contact clinic should he have any questions or concerns prior to his return visit.    Follow up plan: - Return in about 10 days (around 09/05/2019), or phone, for Follow up with Meter and Logs Only - no Labs.  Glade Lloyd, MD [facility] [address] [place], [place] [address] Phone: [phone]  Fax: [phone]    08/26/2019, 11:03 AM  This note was partially dictated with voice recognition software. Similar sounding words can be transcribed inadequately or may not  be corrected upon review.

## 2019-08-26 NOTE — Progress Notes (Signed)
Jared Griffin  08/26/2019  Body mass index is 31.67 kg/m.   HISTORY SECTION :  Chief Complaint  Patient presents with  . Back Pain    rigth hip and leg pain    HPI The patient presents for evaluation of  (mild/moderate/severe/ ) severe pain, in the (right /left) right lower back right hip, for 1 month, associated with radiation into the right leg.  Prior treatment last seen for similar problem April of this year he did well with therapy gabapentin.  But says at this time he is having severe pain and cannot sleep at night cannot lay on his right side   ROS history of heart palpitations history of lateral epicondylitis no current fever chills weight loss or malaise   has a past medical history of Anginal pain ([place]), Anxiety, Cirrhosis of liver without mention of alcohol (2003), COPD (chronic obstructive pulmonary disease) ([place]), Coronary artery disease, Coronary atherosclerosis of native coronary artery, Cyst (solitary) of breast (10/03/2017), Depression, Dyspnea, Elbow injury (07/25/2017), Essential hypertension, benign, Fatty liver disease, nonalcoholic, Lateral epicondylitis of right elbow, Mixed hyperlipidemia (2006), Obesity, Osgood-Schlatter's disease of right knee, and Type 2 diabetes mellitus ([place]).   Past Surgical History:  Procedure Laterality Date  . BIOPSY  01/21/2019   Procedure: BIOPSY;  Surgeon: [name], MD;  [facility]: [facility];  Service: Endoscopy;;  gastric bx and gastric polyps  . CARDIAC CATHETERIZATION  2011  . CARDIAC CATHETERIZATION N/A 09/20/2015   Procedure: Left Heart Cath and Coronary Angiography;  Surgeon: [name], MD;  Location: [facility];  Service: Cardiovascular;  Laterality: N/A;  . CHOLECYSTECTOMY  2003  . COLONOSCOPY  06/19/2012   Procedure: COLONOSCOPY;  Surgeon: [name], MD;  [facility]: [facility];  Service: Endoscopy;  Laterality: N/A;  930  . COLONOSCOPY N/A 10/17/2017   Procedure: COLONOSCOPY;   Surgeon: [name], MD;  Location: [facility];  Service: Endoscopy;  Laterality: N/A;  1225  . CYST EXCISION  07/2019  . ESOPHAGOGASTRODUODENOSCOPY (EGD) WITH PROPOFOL N/A 01/21/2019   Procedure: ESOPHAGOGASTRODUODENOSCOPY (EGD) WITH PROPOFOL;  Surgeon: [name], MD;  [facility]: [facility];  Service: Endoscopy;  Laterality: N/A;  10:15am  . KNEE ARTHROPLASTY Right 1999  . LEFT HEART CATH AND CORONARY ANGIOGRAPHY N/A 02/26/2018   Procedure: LEFT HEART CATH AND CORONARY ANGIOGRAPHY;  Surgeon: [name], MD;  Location: [facility];  Service: Cardiovascular;  Laterality: N/A;  . LEFT HEART CATHETERIZATION WITH CORONARY ANGIOGRAM N/A 12/09/2013   Procedure: LEFT HEART CATHETERIZATION WITH CORONARY ANGIOGRAM;  Surgeon: [name], MD;  Location: [facility];  Service: Cardiovascular;  Laterality: N/A;  . Liver biopsy    . POLYPECTOMY  10/17/2017   Procedure: POLYPECTOMY;  Surgeon: [name], MD;  Location: [facility];  Service: Endoscopy;;  ascending colon, splenic flexure,sigmoid x2  . TENNIS ELBOW RELEASE/NIRSCHEL PROCEDURE Right 07/25/2017   Procedure: TENNIS ELBOW RELEASE and debriedment;  Surgeon: [name], MD;  Location: [facility];  Service: Orthopedics;  Laterality: Right;  . TONSILLECTOMY  1970's    Body mass index is 31.67 kg/m.   Allergies  Allergen Reactions  . Metoprolol Rash     Current Outpatient Medications:  .  albuterol (VENTOLIN HFA) 108 (90 Base) MCG/ACT inhaler, INHALE 2 PUFFS INTO LUNGS EVERY 6 HOURS AS NEEDED FOR WHEEZING AND SHORTNESS OF BREATH., Disp: 8.5 g, Rfl: 5 .  ALPRAZolam (XANAX) 0.5 MG tablet, Take 1 tablet (0.5  mg total) by mouth 3 (three) times daily as needed for sleep or anxiety., Disp: 90 tablet, Rfl: 2 .  aspirin (ASPIR-LOW) 81 MG EC tablet, Take 1 tablet (81 mg total) by mouth daily., Disp: 30 tablet, Rfl: 12 .  atorvastatin (LIPITOR) 40 MG tablet, TAKE (1) TABLET BY MOUTH ONCE DAILY., Disp: 90  tablet, Rfl: 0 .  celecoxib (CELEBREX) 200 MG capsule, Take 1 capsule (200 mg total) by mouth 2 (two) times daily., Disp: 14 capsule, Rfl: 0 .  Cholecalciferol (VITAMIN D3) 125 MCG (5000 UT) CAPS, Take 1 capsule (5,000 Units total) by mouth daily., Disp: 90 capsule, Rfl: 0 .  diclofenac (CATAFLAM) 50 MG tablet, Take 1 tablet (50 mg total) by mouth 2 (two) times daily., Disp: 90 tablet, Rfl: 3 .  diltiazem (CARDIZEM) 30 MG tablet, TAKE (1) TABLET BY MOUTH TWICE DAILY., Disp: 180 tablet, Rfl: 1 .  fluticasone (FLOVENT HFA) 110 MCG/ACT inhaler, Inhale 2 puffs into the lungs 2 (two) times a day., Disp: 1 Inhaler, Rfl: 5 .  gabapentin (NEURONTIN) 300 MG capsule, Take 1 capsule (300 mg total) by mouth 4 (four) times daily., Disp: 120 capsule, Rfl: 5 .  glipiZIDE (GLUCOTROL XL) 5 MG 24 hr tablet, TAKE (2) TABLETS BY MOUTH ONCE DAILY WITH BREAKFAST., Disp: 180 tablet, Rfl: 0 .  Icosapent Ethyl (VASCEPA) 1 g CAPS, Take 2 capsules (2 g total) by mouth 2 (two) times a day., Disp: 120 capsule, Rfl: 11 .  Insulin Glargine, 2 Unit Dial, (TOUJEO MAX SOLOSTAR) 300 UNIT/ML SOPN, Inject 140 Units into the skin at bedtime., Disp: 12 mL, Rfl: 0 .  Lancets MISC, 1 each by Does not apply route 4 (four) times daily., Disp: 150 each, Rfl: 5 .  lidocaine (LIDODERM) 5 %, Place 1 patch onto the skin every 12 (twelve) hours. Remove & Discard patch within 12 hours or as directed by MD, Disp: 30 patch, Rfl: 0 .  losartan (COZAAR) 25 MG tablet, TAKE (1) TABLET BY MOUTH ONCE DAILY., Disp: 90 tablet, Rfl: 0 .  meclizine (ANTIVERT) 25 MG tablet, Take 1 tablet (25 mg total) by mouth 3 (three) times daily as needed for dizziness., Disp: 30 tablet, Rfl: 0 .  metFORMIN (GLUCOPHAGE) 500 MG tablet, Take 2 tablets (1,000 mg total) by mouth 2 (two) times daily with a meal., Disp: 180 tablet, Rfl: 1 .  nitroGLYCERIN (NITROSTAT) 0.4 MG SL tablet, Place 1 tablet (0.4 mg total) under the tongue every 5 (five) minutes as needed for chest pain.,  Disp: 25 tablet, Rfl: 3 .  ONETOUCH ULTRA test strip, TESTING 4 TIMES DAILY., Disp: 150 each, Rfl: 5 .  pantoprazole (PROTONIX) 40 MG tablet, Take one daily, Disp: 90 tablet, Rfl: 1 .  ranolazine (RANEXA) 1000 MG SR tablet, TAKE (1) TABLET BY MOUTH TWICE DAILY., Disp: 60 tablet, Rfl: 3 .  sertraline (ZOLOFT) 100 MG tablet, Take 1.5 tablets (150 mg total) by mouth daily., Disp: 135 tablet, Rfl: 2 .  traZODone (DESYREL) 50 MG tablet, Take 1 tablet (50 mg total) by mouth at bedtime., Disp: 30 tablet, Rfl: 2 .  HYDROcodone-acetaminophen (NORCO/VICODIN) 5-325 MG tablet, Take 1 tablet by mouth every 6 (six) hours as needed for moderate pain., Disp: 30 tablet, Rfl: 0 .  predniSONE (STERAPRED UNI-PAK 48 TAB) 10 MG (48) TBPK tablet, Take by mouth daily., Disp: 48 tablet, Rfl: 0   PHYSICAL EXAM SECTION: 1) BP 140/81   Pulse 98   Ht 6' 2.5" (1.892 m)   Wt 250 lb (113.4  kg)   BMI 31.67 kg/m   Body mass index is 31.67 kg/m. General appearance: Well-developed well-nourished no gross deformities  2) Cardiovascular normal pulse and perfusion in the lower extremities normal color without edema  3) Neurologically deep tendon reflexes are equal and normal, no sensation loss or deficits no pathologic reflexes  4) Psychological: Awake alert and oriented x3 mood and affect normal  5) Skin no lacerations or ulcerations no nodularity no palpable masses, no erythema or nodularity  6) Musculoskeletal:  Back is tender on the right and L5-S1 interspace with positive right straight leg raise negative on the left  Reflexes are equal  Exam is normal  Strength is normal in both legs   MEDICAL DECISION SECTION:  No diagnosis found.  Imaging X-ray from months ago showed spondylosis is never had MRI  Plan:  (Rx., Inj., surg., Frx, MRI/CT, XR:2)  He is not tolerant of NSAIDs but can take prednisone so he was given some prednisone, MRI will be ordered at open unit  I gave her 5 days of hydrocodone for  pain  Call him with results and sent to neurosurgery for follow-up  3:01 PM Arther Abbott, MD  08/26/2019

## 2019-08-26 NOTE — Patient Instructions (Signed)
You have been scheduled for an MRI scan  We will call your insurance company to do a precertification to get the MRI covered  You will receive a phone call regarding the date of the scan  Dr [name] will call you with the results   Meds ordered this encounter  Medications  . HYDROcodone-acetaminophen (NORCO/VICODIN) 5-325 MG tablet    Sig: Take 1 tablet by mouth every 6 (six) hours as needed for moderate pain.    Dispense:  30 tablet    Refill:  0  . predniSONE (STERAPRED UNI-PAK 48 TAB) 10 MG (48) TBPK tablet    Sig: Take by mouth daily.    Dispense:  48 tablet    Refill:  0    REST no heavy lifting  Heating pad 30 minutes at a time as needed

## 2019-08-26 NOTE — Patient Instructions (Signed)

## 2019-09-01 ENCOUNTER — Other Ambulatory Visit (INDEPENDENT_AMBULATORY_CARE_PROVIDER_SITE_OTHER): Payer: Self-pay | Admitting: *Deleted

## 2019-09-01 ENCOUNTER — Other Ambulatory Visit: Payer: Self-pay | Admitting: "Endocrinology

## 2019-09-01 DIAGNOSIS — K760 Fatty (change of) liver, not elsewhere classified: Secondary | ICD-10-CM

## 2019-09-08 ENCOUNTER — Telehealth: Payer: Self-pay | Admitting: "Endocrinology

## 2019-09-08 ENCOUNTER — Ambulatory Visit (INDEPENDENT_AMBULATORY_CARE_PROVIDER_SITE_OTHER): Payer: PRIVATE HEALTH INSURANCE | Admitting: "Endocrinology

## 2019-09-08 ENCOUNTER — Other Ambulatory Visit: Payer: Self-pay | Admitting: Cardiovascular Disease

## 2019-09-08 ENCOUNTER — Encounter: Payer: Self-pay | Admitting: "Endocrinology

## 2019-09-08 ENCOUNTER — Other Ambulatory Visit: Payer: Self-pay | Admitting: "Endocrinology

## 2019-09-08 DIAGNOSIS — E1159 Type 2 diabetes mellitus with other circulatory complications: Secondary | ICD-10-CM | POA: Diagnosis not present

## 2019-09-08 DIAGNOSIS — I10 Essential (primary) hypertension: Secondary | ICD-10-CM

## 2019-09-08 DIAGNOSIS — E782 Mixed hyperlipidemia: Secondary | ICD-10-CM | POA: Diagnosis not present

## 2019-09-08 MED ORDER — NOVOLOG FLEXPEN 100 UNIT/ML ~~LOC~~ SOPN: 10.0000 [IU] | PEN_INJECTOR | Freq: Three times a day (TID) | SUBCUTANEOUS | 2 refills | Status: DC

## 2019-09-08 NOTE — Progress Notes (Signed)
09/08/2019, 9:05 AM                                                     Endocrinology Telehealth Visit Follow up Note -During COVID -19 Pandemic  This visit type was conducted due to national recommendations for restrictions regarding the COVID-19 Pandemic  in an effort to limit this patient's exposure and mitigate transmission of the corona virus.  Due to his co-morbid illnesses, Jared Griffin is at  moderate to high risk for complications without adequate follow up.  This format is felt to be most appropriate for him at this time.  I connected with this patient on 09/08/2019   by telephone and verified that I am speaking with the correct person using two identifiers. Jared Griffin, 09-Jul-1961. he has verbally consented to this visit. All issues noted in this document were discussed and addressed. The format was not optimal for physical exam.   Subjective:    Patient ID: Jared Griffin, male    DOB: 1961-02-08.  Jared Griffin is being engaged in telehealth via telephone in follow-up  for management of currently uncontrolled symptomatic type 2 diabetes, hyperlipidemia, hypertension. PMD:  Mikey Kirschner, MD.   Past Medical History:  Diagnosis Date  . Anginal pain ([place])   . Anxiety   . 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 COPD (chronic obstructive pulmonary disease) ([place])    very close to being diagnosed  . Coronary artery disease   . Coronary atherosclerosis of native coronary artery    a. 12/09/2013: s/p PCI with 3.0 x 28 Promus DES to mLAD  . Cyst (solitary) of breast 10/03/2017   removed from back  . Depression   . Dyspnea    take inhaler  . Elbow injury 07/25/2017   surgery for torn tendon  . Essential hypertension, benign   . Fatty liver disease, nonalcoholic   . Lateral epicondylitis of right elbow   . Mixed hyperlipidemia 2006  . Obesity   . Osgood-Schlatter's disease of right knee   . Type 2  diabetes mellitus ([place])    Past Surgical History:  Procedure Laterality Date  . BIOPSY  01/21/2019   Procedure: BIOPSY;  Surgeon: [name], MD;  [facility];  Service: Endoscopy;;  gastric bx and gastric polyps  . CARDIAC CATHETERIZATION  2011  . CARDIAC CATHETERIZATION N/A 09/20/2015   Procedure: Left Heart Cath and Coronary Angiography;  Surgeon: [name], MD;  Location: [facility];  Service: Cardiovascular;  Laterality: N/A;  . CHOLECYSTECTOMY  2003  . COLONOSCOPY  06/19/2012   Procedure: COLONOSCOPY;  Surgeon: [name], MD;  [facility]: [facility];  Service: Endoscopy;  Laterality: N/A;  930  . COLONOSCOPY N/A 10/17/2017   Procedure: COLONOSCOPY;  Surgeon: [name], MD;  [facility]: [facility];  Service: Endoscopy;  Laterality: N/A;  1225  . CYST EXCISION  07/2019  . ESOPHAGOGASTRODUODENOSCOPY (EGD) WITH PROPOFOL N/A 01/21/2019   Procedure: ESOPHAGOGASTRODUODENOSCOPY (EGD) WITH PROPOFOL;  Surgeon: [name], MD;  [facility];  Service: Endoscopy;  Laterality: N/A;  10:15am  . KNEE ARTHROPLASTY Right 1999  . LEFT HEART CATH AND CORONARY ANGIOGRAPHY N/A 02/26/2018   Procedure: LEFT HEART CATH AND CORONARY ANGIOGRAPHY;  Surgeon: [name], MD;  Location: [facility];  Service: Cardiovascular;  Laterality: N/A;  . LEFT HEART CATHETERIZATION WITH CORONARY ANGIOGRAM N/A 12/09/2013   Procedure: LEFT HEART CATHETERIZATION WITH CORONARY ANGIOGRAM;  Surgeon: [name], MD;  Location: [facility] LAB;  Service: Cardiovascular;  Laterality: N/A;  . Liver biopsy    . POLYPECTOMY  10/17/2017   Procedure: POLYPECTOMY;  Surgeon: [name], MD;  [facility];  Service: Endoscopy;;  ascending colon, splenic flexure,sigmoid x2  . TENNIS ELBOW RELEASE/NIRSCHEL PROCEDURE Right 07/25/2017   Procedure: TENNIS ELBOW RELEASE and debriedment;  Surgeon: [name], MD;  Location: [facility];  Service:  Orthopedics;  Laterality: Right;  . TONSILLECTOMY  1970's   Social History   Socioeconomic History  . Marital status: Married    Spouse name: Not on file  . Number of children: Not on file  . Years of education: Not on file  . Highest education level: Not on file  Occupational History  . Not on file  Tobacco Use  . Smoking status: Former Smoker    Packs/day: 0.50    Years: 10.00    Pack years: 5.00    Types: Cigarettes    Start date: 02/02/1973    Quit date: 09/17/1983    Years since quitting: 36.0  . Smokeless tobacco: Former Systems developer    Types: [place] date: 09/17/1983  Substance and Sexual Activity  . Alcohol use: No    Alcohol/week: 0.0 standard drinks  . Drug use: No  . Sexual activity: Yes  Other Topics Concern  . 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    Married with 1 child   Right handed    12 th    Very little caffeine   Social Determinants of Health   Financial Resource Strain: Low Risk   . Difficulty of Paying Living Expenses: Not hard at all  Food Insecurity: No Food Insecurity  . Worried About Charity fundraiser in the Last Year: Never true  . Ran Out of Food in the Last Year: Never true  Transportation Needs: No Transportation Needs  . Lack of Transportation (Medical): No  . Lack of Transportation (Non-Medical): No  Physical Activity: Unknown  . Days of Exercise per Week: Patient refused  . Minutes of Exercise per Session: Patient refused  Stress: Stress Concern Present  . Feeling of Stress : To some extent  Social Connections: Unknown  . Frequency of Communication with Friends and Family: Patient refused  . Frequency of Social Gatherings with Friends and Family: Patient refused  . Attends Religious Services: Patient refused  . Active Member of Clubs or Organizations: Patient refused  . Attends Archivist Meetings: Patient refused  . Marital  Status: Patient refused   Outpatient Encounter Medications as of 09/08/2019  Medication Sig  . albuterol (VENTOLIN HFA) 108 (90 Base) MCG/ACT inhaler INHALE 2 PUFFS INTO LUNGS EVERY 6 HOURS AS NEEDED FOR WHEEZING AND SHORTNESS OF BREATH.  Marland Kitchen ALPRAZolam (XANAX) 0.5 MG tablet Take 1 tablet (0.5 mg total) by mouth 3 (three) times daily as needed for sleep or anxiety.  Marland Kitchen aspirin (ASPIR-LOW) 81 MG EC tablet Take 1 tablet (81 mg total) by mouth daily.  Marland Kitchen atorvastatin (LIPITOR) 40 MG tablet TAKE (1) TABLET BY MOUTH ONCE DAILY.  . celecoxib (CELEBREX) 200 MG capsule Take 1 capsule (200  mg total) by mouth 2 (two) times daily.  . Cholecalciferol (VITAMIN D3) 125 MCG (5000 UT) CAPS Take 1 capsule (5,000 Units total) by mouth daily.  . diclofenac (CATAFLAM) 50 MG tablet Take 1 tablet (50 mg total) by mouth 2 (two) times daily.  Marland Kitchen diltiazem (CARDIZEM) 30 MG tablet TAKE (1) TABLET BY MOUTH TWICE DAILY.  . fluticasone (FLOVENT HFA) 110 MCG/ACT inhaler Inhale 2 puffs into the lungs 2 (two) times a day.  . gabapentin (NEURONTIN) 300 MG capsule Take 1 capsule (300 mg total) by mouth 4 (four) times daily.  Marland Kitchen glipiZIDE (GLUCOTROL XL) 5 MG 24 hr tablet TAKE (2) TABLETS BY MOUTH ONCE DAILY WITH BREAKFAST.  Marland Kitchen HYDROcodone-acetaminophen (NORCO/VICODIN) 5-325 MG tablet Take 1 tablet by mouth every 6 (six) hours as needed for moderate pain.  Vanessa Kick Ethyl (VASCEPA) 1 g CAPS Take 2 capsules (2 g total) by mouth 2 (two) times a day.  . insulin aspart (NOVOLOG FLEXPEN) 100 UNIT/ML FlexPen Inject 10 Units into the skin 3 (three) times daily before meals.  . Insulin Glargine, 2 Unit Dial, (TOUJEO MAX SOLOSTAR) 300 UNIT/ML SOPN Inject 140 Units into the skin at bedtime.  . Lancets MISC 1 each by Does not apply route 4 (four) times daily.  Marland Kitchen lidocaine (LIDODERM) 5 % Place 1 patch onto the skin every 12 (twelve) hours. Remove & Discard patch within 12 hours or as directed by MD  . losartan (COZAAR) 25 MG tablet TAKE (1) TABLET  BY MOUTH ONCE DAILY.  . meclizine (ANTIVERT) 25 MG tablet Take 1 tablet (25 mg total) by mouth 3 (three) times daily as needed for dizziness.  . metFORMIN (GLUCOPHAGE) 500 MG tablet Take 2 tablets (1,000 mg total) by mouth 2 (two) times daily with a meal.  . nitroGLYCERIN (NITROSTAT) 0.4 MG SL tablet Place 1 tablet (0.4 mg total) under the tongue every 5 (five) minutes as needed for chest pain.  Glory Rosebush ULTRA test strip TESTING 4 TIMES DAILY.  . pantoprazole (PROTONIX) 40 MG tablet Take one daily  . predniSONE (STERAPRED UNI-PAK 48 TAB) 10 MG (48) TBPK tablet Take by mouth daily.  . ranolazine (RANEXA) 1000 MG SR tablet TAKE (1) TABLET BY MOUTH TWICE DAILY.  Marland Kitchen sertraline (ZOLOFT) 100 MG tablet Take 1.5 tablets (150 mg total) by mouth daily.  . traZODone (DESYREL) 50 MG tablet Take 1 tablet (50 mg total) by mouth at bedtime.   No facility-administered encounter medications on file as of 09/08/2019.    ALLERGIES: Allergies  Allergen Reactions  . Metoprolol Rash    VACCINATION STATUS: Immunization History  Administered Date(s) Administered  . Influenza,inj,Quad PF,6+ Mos 07/11/2016, 08/02/2017, 08/05/2018, 08/10/2019  . Influenza-Unspecified 06/18/2013, 06/17/2014, 07/06/2015  . Td 01/28/2014    Diabetes He presents for his follow-up diabetic visit. He has type 2 diabetes mellitus. Onset time: He was diagnosed at approximate age of 41 years. His disease course has been worsening. There are no hypoglycemic associated symptoms. Pertinent negatives for hypoglycemia include no confusion, headaches, pallor or seizures. Associated symptoms include polydipsia and polyuria. Pertinent negatives for diabetes include no chest pain, no fatigue, no polyphagia and no weakness. There are no hypoglycemic complications. Symptoms are worsening. Diabetic complications include heart disease. Risk factors for coronary artery disease include diabetes mellitus, dyslipidemia, hypertension, male sex, obesity,  sedentary lifestyle and tobacco exposure. Current diabetic treatment includes oral agent (dual therapy) (He is currently on Invokana 300 mg p.o. daily, metformin 500 mg p.o. twice daily, glipizide 5 mg p.o. twice  daily.). His weight is increasing steadily. He is following a generally unhealthy diet. When asked about meal planning, he reported none. He has not had a previous visit with a dietitian. He rarely participates in exercise. His home blood glucose trend is increasing steadily. His breakfast blood glucose range is generally 180-200 mg/dl. His lunch blood glucose range is generally >200 mg/dl. His dinner blood glucose range is generally >200 mg/dl. His bedtime blood glucose range is generally >200 mg/dl. His overall blood glucose range is >200 mg/dl. (His fasting blood glucose from the most recent :  131, 138, 80, 202, 238, 130, 98, 162, 209 His lunchtime readings from the most recent: 321, 368, 264, 331, 298, 313, 268, 155, 401 Bedtime readings from the most recent: 277, 268, 312, 227, 350, 247, 314, 238, 378) An ACE inhibitor/angiotensin II receptor blocker is being taken. Eye exam is current.  Hyperlipidemia This is a chronic problem. The problem is uncontrolled (He has severe hypertriglyceridemia at 1157.). Exacerbating diseases include diabetes and obesity. Pertinent negatives include no chest pain, myalgias or shortness of breath. Risk factors for coronary artery disease include diabetes mellitus, dyslipidemia, hypertension, male sex, obesity and a sedentary lifestyle.  Hypertension This is a chronic problem. The current episode started more than 1 year ago. The problem is controlled. Pertinent negatives include no chest pain, headaches, neck pain, palpitations or shortness of breath. Risk factors for coronary artery disease include diabetes mellitus, dyslipidemia, male gender, obesity, sedentary lifestyle and smoking/tobacco exposure. Hypertensive end-organ damage includes CAD/MI.   Review of  systems: Limited as above.   Objective:    There were no vitals taken for this visit.  Wt Readings from Last 3 Encounters:  08/26/19 250 lb (113.4 kg)  08/10/19 262 lb (118.8 kg)  08/04/19 250 lb (113.4 kg)     CMP     Component Value Date/Time   NA 139 08/20/2019 0937   K 4.1 08/20/2019 0937   CL 98 08/20/2019 0937   CO2 25 08/20/2019 0937   GLUCOSE 184 (H) 08/20/2019 0937   GLUCOSE 298 (H) 01/04/2019 1016   BUN 16 08/20/2019 0937   CREATININE 0.93 08/20/2019 0937   CREATININE 0.96 12/07/2013 0950   CALCIUM 9.6 08/20/2019 0937   PROT 7.0 08/20/2019 0937   ALBUMIN 4.6 08/20/2019 0937   AST 31 08/20/2019 0937   ALT 53 (H) 08/20/2019 0937   ALKPHOS 52 08/20/2019 0937   BILITOT 0.6 08/20/2019 0937   GFRNONAA 91 08/20/2019 0937   GFRAA 105 08/20/2019 0937     Diabetic Labs (most recent): Lab Results  Component Value Date   HGBA1C 9.1 (H) 08/20/2019   HGBA1C 9.1 08/20/2019   HGBA1C 8.9 (H) 04/15/2019     Lipid Panel ( most recent) Lipid Panel     Component Value Date/Time   CHOL 116 10/18/2018 0903   TRIG 173 (H) 10/18/2018 0903   HDL 30 (L) 10/18/2018 0903   CHOLHDL 3.9 10/18/2018 0903   CHOLHDL 6.4 07/12/2018 0713   VLDL UNABLE TO CALCULATE IF TRIGLYCERIDE OVER 400 mg/dL 07/12/2018 0713   LDLCALC 51 10/18/2018 0903      Lab Results  Component Value Date   TSH 0.648 08/20/2019   TSH 0.65 08/20/2019   TSH 0.525 07/12/2018   TSH 0.458 04/13/2017   TSH 0.493 02/05/2014   FREET4 1.35 08/20/2019      Assessment & Plan:   1. DM type 2 causing vascular disease ([place])  - Jared Griffin has currently uncontrolled symptomatic type  2 DM since 58 years of age. -He is reporting significantly above target glycemic profile and A1c of 9.1% increasing from 8.9%.  -He continues to have significantly above target postprandial glycemic profile despite higher dose of basal insulin. His fasting blood glucose from the most recent :  131, 138, 80, 202, 238, 130,  98, 162, 209 His lunchtime readings from the most recent: 321, 368, 264, 331, 298, 313, 268, 155, 401 Bedtime readings from the most recent: 277, 268, 312, 227, 350, 247, 314, 238, 378 -his diabetes is complicated by coronary artery disease, obesity/sedentary life, history of smoking and he remains at a high risk for more acute and chronic complications which include CAD, CVA, CKD, retinopathy, and neuropathy. These are all discussed in detail with him.  - I have counseled him on diet management and weight loss, by adopting a carbohydrate restricted/protein rich diet.  - he  admits there is a room for improvement in his diet and drink choices. -  Suggestion is made for him to avoid simple carbohydrates  from his diet including Cakes, Sweet Desserts / Pastries, Ice Cream, Soda (diet and regular), Sweet Tea, Candies, Chips, Cookies, Sweet Pastries,  Store Bought Juices, Alcohol in Excess of  1-2 drinks a day, Artificial Sweeteners, Coffee Creamer, and "Sugar-free" Products. This will help patient to have stable blood glucose profile and potentially avoid unintended weight gain.  - I encouraged him to switch to  unprocessed or minimally processed complex starch and increased protein intake (animal or plant source), fruits, and vegetables.  - he is advised to stick to a routine mealtimes to eat 3 meals  a day and avoid unnecessary snacks ( to snack only to correct hypoglycemia).   - he has been scheduled with Jearld Fenton, RDN, CDE for individualized diabetes education.  - I have approached him with the following individualized plan to manage diabetes and patient agrees:   -He will continue to benefit from basal insulin at a higher dose. -Due to the fact that he continues to have significantly above target glycemic profile postprandially, he will be considered for NovoLog.  -He is advised to continue Toujeo max 140 units subcutaneously nightly, discussed and added NovoLog 10 units 3 times daily AC  for Premeal blood glucose readings above 90 mg per DL, approached to continue monitoring blood glucose 4 times a day-before meals and at bedtime and he is advised to return to office in 10 days with his meter and logs.   - he is encouraged to call clinic for blood glucose levels less than 70 or above 200 mg /dl. -He is advised to continue Metformin 1000 mg p.o. twice daily, glipizide 10 mg XL p.o. daily at breakfast.    -Due to severe hypertriglyceridemia,  he is not a suitable candidate for incretin therapy for now-insulin treatment would also help.   - Patient specific target  A1c;  LDL, HDL, Triglycerides, and  Waist Circumference were discussed in detail.  2) BP/HTN: he is advised to home monitor blood pressure and report if > 140/90 on 2 separate readings.   he is advised to continue his current medications including losartan 25 mg p.o. daily with breakfast .  3) Lipids/HPL:   Review of his recent lipid panel showed unknown LDL due to severe hypertriglyceridemia of 1157+. -He is advised to continue atorvastatin 40 mg p.o. nightly ,  he is also on Vascepa 2 g p.o. twice daily.  He is advised to be consistent on his medications.  4)  Weight/Diet: His BMI 33- I discussed with him the fact that loss of 5 - 10% of his  current body weight will have the most impact on his diabetes management.  CDE Consult will be initiated . Exercise, and detailed carbohydrates information provided  -  detailed on discharge instructions.  5) Chronic Care/Health Maintenance:  -he  is on ACEI/ARB and Statin medications and  is encouraged to initiate and continue to follow up with Ophthalmology, Dentist,  Podiatrist at least yearly or according to recommendations, and advised to   stay away from smoking. I have recommended yearly flu vaccine and pneumonia vaccine at least every 5 years; moderate intensity exercise for up to 150 minutes weekly; and  sleep for at least 7 hours a day.  - I advised patient to  maintain close follow up with Mikey Kirschner, MD for primary care needs.  - Patient Care Time Today:  25 min, of which >50% was spent in  counseling and the rest reviewing his  current and  previous labs/studies, previous treatments, his blood glucose readings, and medications' doses and developing a plan for long-term care based on the latest recommendations for standards of care.   Jared Griffin participated in the discussions, expressed understanding, and voiced agreement with the above plans.  All questions were answered to his satisfaction. he is encouraged to contact clinic should he have any questions or concerns prior to his return visit.   Follow up plan: - Return in about 10 days (around 09/18/2019), or Office, for Follow up with Meter and Logs Only - no Labs.  Glade Lloyd, MD [facility] [address] [place], [place] [address] Phone: [phone]  Fax: [phone]    09/08/2019, 9:05 AM  This note was partially dictated with voice recognition software. Similar sounding words can be transcribed inadequately or may not  be corrected upon review.

## 2019-09-08 NOTE — Telephone Encounter (Signed)
Left msg for pt to call back if he did not receive instructions from Dr Dorris Fetch about Novolog dosage at visit.

## 2019-09-08 NOTE — Telephone Encounter (Signed)
Yes , I want him to add Novolog with meals x 3 day to control his readings better. We can always stop if not needed , keep next appointment.

## 2019-09-08 NOTE — Telephone Encounter (Signed)
Pt called and said that he forgot to tell you this morning on his phone visit that he has been on a steroid the last 10 days and that may be way his numbers were running high. He wants to know do you still want to change his medication? He did finish the steroid yesterday

## 2019-09-13 ENCOUNTER — Other Ambulatory Visit: Payer: Self-pay

## 2019-09-13 ENCOUNTER — Ambulatory Visit
Admission: EM | Admit: 2019-09-13 | Discharge: 2019-09-13 | Disposition: A | Discharge status: Home / Self Care | Payer: PRIVATE HEALTH INSURANCE | Attending: Emergency Medicine | Admitting: Emergency Medicine

## 2019-09-13 DIAGNOSIS — Z20822 Contact with and (suspected) exposure to covid-19: Secondary | ICD-10-CM

## 2019-09-13 DIAGNOSIS — B9789 Other viral agents as the cause of diseases classified elsewhere: Secondary | ICD-10-CM

## 2019-09-13 DIAGNOSIS — R05 Cough: Secondary | ICD-10-CM

## 2019-09-13 DIAGNOSIS — J069 Acute upper respiratory infection, unspecified: Secondary | ICD-10-CM

## 2019-09-13 DIAGNOSIS — J3489 Other specified disorders of nose and nasal sinuses: Secondary | ICD-10-CM | POA: Diagnosis not present

## 2019-09-13 DIAGNOSIS — Z20828 Contact with and (suspected) exposure to other viral communicable diseases: Secondary | ICD-10-CM

## 2019-09-13 DIAGNOSIS — R519 Headache, unspecified: Secondary | ICD-10-CM | POA: Diagnosis not present

## 2019-09-13 LAB — POC SARS CORONAVIRUS 2 AG -  ED: SARS Coronavirus 2 Ag: POSITIVE — AB

## 2019-09-13 MED ORDER — CETIRIZINE HCL 10 MG PO TABS: 10.0000 mg | ORAL_TABLET | Freq: Every day | ORAL | 0 refills | Status: DC

## 2019-09-13 MED ORDER — BENZONATATE 100 MG PO CAPS: 100.0000 mg | ORAL_CAPSULE | Freq: Three times a day (TID) | ORAL | 0 refills | Status: DC

## 2019-09-13 MED ORDER — FLUTICASONE PROPIONATE 50 MCG/ACT NA SUSP: 2.0000 | Freq: Every day | NASAL | 0 refills | Status: DC

## 2019-09-13 NOTE — ED Provider Notes (Signed)
[facility]   [id number] 09/13/19 Arrival Time: 0920   CC: COVID symptoms  SUBJECTIVE: History from: patient.  Jared Griffin is a 58 y.o. male who presents with headache, sinus pain/ pressure, and productive cough with clear sputum x 3 days.  Denies sick exposure to COVID, flu or strep.  Denies recent travel.  Has tried OTC medications with relief.  Denies aggravating factors.  Reports previous symptoms in the past related to cold/ sinus symptoms.   Denies fever, chills, fatigue, rhinorrhea, sore throat, SOB, wheezing, chest pain, nausea, vomiting, changes in bowel or bladder habits.    ROS: As per HPI.  All other pertinent ROS negative.     Past Medical History:  Diagnosis Date  . Anginal pain ([place])   . Anxiety   . 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 COPD (chronic obstructive pulmonary disease) ([place])    very close to being diagnosed  . Coronary artery disease   . Coronary atherosclerosis of native coronary artery    a. 12/09/2013: s/p PCI with 3.0 x 28 Promus DES to mLAD  . Cyst (solitary) of breast 10/03/2017   removed from back  . Depression   . Dyspnea    take inhaler  . Elbow injury 07/25/2017   surgery for torn tendon  . Essential hypertension, benign   . Fatty liver disease, nonalcoholic   . Lateral epicondylitis of right elbow   . Mixed hyperlipidemia 2006  . Obesity   . Osgood-Schlatter's disease of right knee   . Type 2 diabetes mellitus ([place])    Past Surgical History:  Procedure Laterality Date  . BIOPSY  01/21/2019   Procedure: BIOPSY;  Surgeon: [name], MD;  [facility];  Service: Endoscopy;;  gastric bx and gastric polyps  . CARDIAC CATHETERIZATION  2011  . CARDIAC CATHETERIZATION N/A 09/20/2015   Procedure: Left Heart Cath and Coronary Angiography;  Surgeon: [name], MD;  Location: [facility];  Service: Cardiovascular;  Laterality: N/A;  . CHOLECYSTECTOMY  2003  . COLONOSCOPY   06/19/2012   Procedure: COLONOSCOPY;  Surgeon: [name], MD;  Location: [facility];  Service: Endoscopy;  Laterality: N/A;  930  . COLONOSCOPY N/A 10/17/2017   Procedure: COLONOSCOPY;  Surgeon: [name], MD;  [facility]: [facility];  Service: Endoscopy;  Laterality: N/A;  1225  . CYST EXCISION  07/2019  . ESOPHAGOGASTRODUODENOSCOPY (EGD) WITH PROPOFOL N/A 01/21/2019   Procedure: ESOPHAGOGASTRODUODENOSCOPY (EGD) WITH PROPOFOL;  Surgeon: [name], MD;  [facility];  Service: Endoscopy;  Laterality: N/A;  10:15am  . KNEE ARTHROPLASTY Right 1999  . LEFT HEART CATH AND CORONARY ANGIOGRAPHY N/A 02/26/2018   Procedure: LEFT HEART CATH AND CORONARY ANGIOGRAPHY;  Surgeon: [name], MD;  Location: [facility];  Service: Cardiovascular;  Laterality: N/A;  . LEFT HEART CATHETERIZATION WITH CORONARY ANGIOGRAM N/A 12/09/2013   Procedure: LEFT HEART CATHETERIZATION WITH CORONARY ANGIOGRAM;  Surgeon: [name], MD;  Location: [facility] CATH LAB;  Service: Cardiovascular;  Laterality: N/A;  . Liver biopsy    . POLYPECTOMY  10/17/2017   Procedure: POLYPECTOMY;  Surgeon: [name], MD;  [facility];  Service: Endoscopy;;  ascending colon, splenic flexure,sigmoid x2  . TENNIS ELBOW RELEASE/NIRSCHEL PROCEDURE Right 07/25/2017   Procedure: TENNIS ELBOW RELEASE and debriedment;  Surgeon: [name], MD;  Location: [facility];  Service: Orthopedics;  Laterality: Right;  . TONSILLECTOMY  1970's   Allergies  Allergen Reactions  . Metoprolol Rash   No current facility-administered medications on file prior to encounter.   Current Outpatient Medications on File Prior to Encounter  Medication Sig Dispense Refill  . albuterol (VENTOLIN HFA) 108 (90 Base) MCG/ACT inhaler INHALE 2 PUFFS INTO LUNGS EVERY 6 HOURS AS NEEDED FOR WHEEZING AND SHORTNESS OF BREATH. 8.5 g 5  . ALPRAZolam (XANAX) 0.5 MG tablet Take 1 tablet (0.5 mg total) by mouth 3  (three) times daily as needed for sleep or anxiety. 90 tablet 2  . aspirin (ASPIR-LOW) 81 MG EC tablet Take 1 tablet (81 mg total) by mouth daily. 30 tablet 12  . atorvastatin (LIPITOR) 40 MG tablet TAKE (1) TABLET BY MOUTH ONCE DAILY. 90 tablet 1  . B-D UF III MINI PEN NEEDLES 31G X 5 MM MISC USE WITH TRESIBA INSULIN DAILY AS DIRECTED. 100 each 5  . celecoxib (CELEBREX) 200 MG capsule Take 1 capsule (200 mg total) by mouth 2 (two) times daily. 14 capsule 0  . Cholecalciferol (VITAMIN D3) 125 MCG (5000 UT) CAPS Take 1 capsule (5,000 Units total) by mouth daily. 90 capsule 0  . diclofenac (CATAFLAM) 50 MG tablet Take 1 tablet (50 mg total) by mouth 2 (two) times daily. 90 tablet 3  . diltiazem (CARDIZEM) 30 MG tablet TAKE (1) TABLET BY MOUTH TWICE DAILY. 180 tablet 1  . fluticasone (FLOVENT HFA) 110 MCG/ACT inhaler Inhale 2 puffs into the lungs 2 (two) times a day. 1 Inhaler 5  . gabapentin (NEURONTIN) 300 MG capsule Take 1 capsule (300 mg total) by mouth 4 (four) times daily. 120 capsule 5  . glipiZIDE (GLUCOTROL XL) 5 MG 24 hr tablet TAKE (2) TABLETS BY MOUTH ONCE DAILY WITH BREAKFAST. 180 tablet 0  . HYDROcodone-acetaminophen (NORCO/VICODIN) 5-325 MG tablet Take 1 tablet by mouth every 6 (six) hours as needed for moderate pain. 30 tablet 0  . Icosapent Ethyl (VASCEPA) 1 g CAPS Take 2 capsules (2 g total) by mouth 2 (two) times a day. 120 capsule 11  . insulin aspart (NOVOLOG FLEXPEN) 100 UNIT/ML FlexPen Inject 10 Units into the skin 3 (three) times daily before meals. 5 pen 2  . Insulin Glargine, 2 Unit Dial, (TOUJEO MAX SOLOSTAR) 300 UNIT/ML SOPN Inject 140 Units into the skin at bedtime. 13.5 mL 2  . Lancets MISC 1 each by Does not apply route 4 (four) times daily. 150 each 5  . lidocaine (LIDODERM) 5 % Place 1 patch onto the skin every 12 (twelve) hours. Remove & Discard patch within 12 hours or as directed by MD 30 patch 0  . losartan (COZAAR) 25 MG tablet TAKE (1) TABLET BY MOUTH ONCE DAILY.  90 tablet 0  . meclizine (ANTIVERT) 25 MG tablet Take 1 tablet (25 mg total) by mouth 3 (three) times daily as needed for dizziness. 30 tablet 0  . metFORMIN (GLUCOPHAGE) 500 MG tablet Take 2 tablets (1,000 mg total) by mouth 2 (two) times daily with a meal. 180 tablet 1  . nitroGLYCERIN (NITROSTAT) 0.4 MG SL tablet Place 1 tablet (0.4 mg total) under the tongue every 5 (five) minutes as needed for chest pain. 25 tablet 3  . ONETOUCH ULTRA test strip TESTING 4 TIMES DAILY. 150 each 5  . pantoprazole (PROTONIX) 40 MG tablet Take one daily 90 tablet 1  . predniSONE (STERAPRED UNI-PAK 48 TAB) 10 MG (48) TBPK tablet Take by mouth daily. 48 tablet 0  . ranolazine (RANEXA) 1000 MG SR tablet  TAKE (1) TABLET BY MOUTH TWICE DAILY. 60 tablet 3  . sertraline (ZOLOFT) 100 MG tablet Take 1.5 tablets (150 mg total) by mouth daily. 135 tablet 2  . traZODone (DESYREL) 50 MG tablet Take 1 tablet (50 mg total) by mouth at bedtime. 30 tablet 2   Social History   Socioeconomic History  . Marital status: Married    Spouse name: Not on file  . Number of children: Not on file  . Years of education: Not on file  . Highest education level: Not on file  Occupational History  . Not on file  Tobacco Use  . Smoking status: Former Smoker    Packs/day: 0.50    Years: 10.00    Pack years: 5.00    Types: Cigarettes    Start date: 02/02/1973    Quit date: 09/17/1983    Years since quitting: 36.0  . Smokeless tobacco: Former Systems developer    Types: [place] date: 09/17/1983  Substance and Sexual Activity  . Alcohol use: No    Alcohol/week: 0.0 standard drinks  . Drug use: No  . Sexual activity: Yes  Other Topics Concern  . 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    Married with 1 child   Right handed    12 th    Very little caffeine   Social Determinants of Health   Financial Resource Strain: Low Risk   . Difficulty of  Paying Living Expenses: Not hard at all  Food Insecurity: No Food Insecurity  . Worried About Charity fundraiser in the Last Year: Never true  . Ran Out of Food in the Last Year: Never true  Transportation Needs: No Transportation Needs  . Lack of Transportation (Medical): No  . Lack of Transportation (Non-Medical): No  Physical Activity: Unknown  . Days of Exercise per Week: Patient refused  . Minutes of Exercise per Session: Patient refused  Stress: Stress Concern Present  . Feeling of Stress : To some extent  Social Connections: Unknown  . Frequency of Communication with Friends and Family: Patient refused  . Frequency of Social Gatherings with Friends and Family: Patient refused  . Attends Religious Services: Patient refused  . Active Member of Clubs or Organizations: Patient refused  . Attends Archivist Meetings: Patient refused  . Marital Status: Patient refused  Intimate Partner Violence: Not At Risk  . Fear of Current or Ex-Partner: No  . Emotionally Abused: No  . Physically Abused: No  . Sexually Abused: No   Family History  Problem Relation Age of Onset  . Cancer Maternal Aunt   . CVA Maternal Grandmother   . CVA Maternal Grandfather     OBJECTIVE:  Vitals:   09/13/19 0934  BP: (!) 148/92  Pulse: (!) 107  Resp: 17  Temp: 99 F (37.2 C)  TempSrc: Oral  SpO2: 95%     General appearance: alert; appears mildly fatigued, but nontoxic; speaking in full sentences and tolerating own secretions HEENT: NCAT; Ears: EACs clear, TMs pearly gray; Eyes: PERRL.  EOM grossly intact. Nose: nares patent without rhinorrhea, Throat: oropharynx clear, tonsils non erythematous or enlarged, uvula midline  Neck: supple without LAD Lungs: unlabored respirations, symmetrical air entry; cough: absent; no respiratory distress; CTAB Heart: regular rate and rhythm.   Skin: warm and dry Psychological: alert and cooperative; normal mood and affect  LABS:  Results for  orders placed or performed during the hospital encounter of 09/13/19 (from the past 24 hour(s))  POC SARS Coronavirus 2 Ag-ED - Nasal Swab (BD Veritor Kit)     Status: Abnormal   Collection Time: 09/13/19  9:45 AM  Result Value Ref Range   SARS Coronavirus 2 Ag Positive (A) Negative     ASSESSMENT & PLAN:  1. Suspected COVID-19 virus infection   2. Viral URI with cough     Meds ordered this encounter  Medications  . cetirizine (ZYRTEC) 10 MG tablet    Sig: Take 1 tablet (10 mg total) by mouth daily.    Dispense:  30 tablet    Refill:  0    Order Specific Question:   Supervising Provider    Answer:   Raylene Everts [[id number]]  . fluticasone (FLONASE) 50 MCG/ACT nasal spray    Sig: Place 2 sprays into both nostrils daily.    Dispense:  16 g    Refill:  0    Order Specific Question:   Supervising Provider    Answer:   Raylene Everts [[id number]]  . benzonatate (TESSALON) 100 MG capsule    Sig: Take 1 capsule (100 mg total) by mouth every 8 (eight) hours.    Dispense:  21 capsule    Refill:  0    Order Specific Question:   Supervising Provider    Answer:   Raylene Everts [[id number]]    COVID test was positive You should remain isolated in your home for 10 days from symptom onset AND greater than 72 hours after symptoms resolution (absence of fever without the use of fever-reducing medication and improvement in respiratory symptoms), whichever is longer Get plenty of rest and push fluids Use zyrtec for nasal congestion, runny nose, and/or sore throat Use flonase for nasal congestion and runny nose Use medications daily for symptom relief Use OTC medications like ibuprofen or tylenol as needed fever or pain Follow up with PCP in 1-2 days via phone or e-visit for recheck and to ensure symptoms are improving Call or go to the ED if you have any new or worsening symptoms such as fever, worsening cough, shortness of breath, chest tightness, chest pain, turning blue, changes in  mental status, etc...    Reviewed expectations re: course of current medical issues. Questions answered. Outlined signs and symptoms indicating need for more acute intervention. Patient verbalized understanding. After Visit Summary given.         Lestine Box, PA-C 09/13/19 1023

## 2019-09-13 NOTE — ED Triage Notes (Signed)
Pt presents with headache and cough since Thursday

## 2019-09-13 NOTE — Discharge Instructions (Signed)
COVID test was positive You should remain isolated in your home for 10 days from symptom onset AND greater than 72 hours after symptoms resolution (absence of fever without the use of fever-reducing medication and improvement in respiratory symptoms), whichever is longer Get plenty of rest and push fluids Use zyrtec for nasal congestion, runny nose, and/or sore throat Use flonase for nasal congestion and runny nose Use medications daily for symptom relief Use OTC medications like ibuprofen or tylenol as needed fever or pain Follow up with PCP in 1-2 days via phone or e-visit for recheck and to ensure symptoms are improving Call or go to the ED if you have any new or worsening symptoms such as fever, worsening cough, shortness of breath, chest tightness, chest pain, turning blue, changes in mental status, etc..Marland Kitchen

## 2019-09-14 ENCOUNTER — Telehealth: Payer: Self-pay | Admitting: Nurse Practitioner

## 2019-09-14 ENCOUNTER — Ambulatory Visit (INDEPENDENT_AMBULATORY_CARE_PROVIDER_SITE_OTHER): Payer: PRIVATE HEALTH INSURANCE | Admitting: Family Medicine

## 2019-09-14 ENCOUNTER — Ambulatory Visit (HOSPITAL_COMMUNITY): Admission: RE | Admit: 2019-09-14 | Payer: PRIVATE HEALTH INSURANCE | Source: Ambulatory Visit

## 2019-09-14 DIAGNOSIS — U071 COVID-19: Secondary | ICD-10-CM

## 2019-09-14 NOTE — Telephone Encounter (Signed)
Called to Discuss with patient about Covid symptoms and the use of bamlanivimab, a monoclonal antibody infusion for those with mild to moderate Covid symptoms and at a high risk of hospitalization.     Pt is qualified for this infusion at the [facility] infusion center due to co-morbid conditions and/or a member of an at-risk group.     Patient Active Problem List   Diagnosis Date Noted  . Abdominal pain, epigastric 01/13/2019  . Anxiety 01/04/2019  . Radicular pain of left lower extremity 12/22/2018  . Moderate persistent asthma 10/25/2018  . Dizziness 07/12/2018  . Nonintractable headache   . Abnormal nuclear stress test   . Aftercare following surgery 07/25/17 08/13/2017  . Hx of colonic polyps 08/07/2017  . Lateral epicondylitis of right elbow   . Cervical nerve root impingement 12/09/2015  . Generalized anxiety disorder 09/27/2015  . Chest pain 09/20/2015  . Pain in the chest   . Depression 01/24/2015  . Esophageal reflux 01/24/2015  . Preoperative cardiovascular examination 06/28/2014  . DM type 2 causing vascular disease ([place])   . Mixed hyperlipidemia   . Hepatic cirrhosis ([place])   . Fatty liver disease, nonalcoholic   . Obesity   . Intermediate coronary syndrome ([place]) 12/09/2013  . Fatty liver 05/05/2013  . Lumbago 05/21/2012  . Essential hypertension, benign 12/28/2009  . CAD S/P LAD DES March 2015 12/28/2009  . Hyperlipidemia LDL goal <70 11/25/2009  . Accelerating angina ([place]) 11/25/2009  . DM 11/24/2009  . ABDOMINAL PAIN, HX OF 11/24/2009    Patient declines infusion at this time. Patient states that he does not think he has COVID even though he had a positive test and has symptoms of headache, cough, sinus congestion, and body aches. Symptoms tier reviewed as well as criteria for ending isolation. Preventative practices reviewed. Patient verbalized understanding.    Patient advised to call back if he decides that he does want to get infusion. Callback number to  the infusion center given. Patient advised to go to Urgent care or ED with severe symptoms.   Symptoms started on 09/10/19

## 2019-09-14 NOTE — Progress Notes (Signed)
   Subjective:  Audio only  Patient ID: Jared Griffin, male    DOB: 1960/10/11, 58 y.o.   MRN: [id number]  HPI Pt went to Urgent Care yesterday due to feeling like he had a sinus headache. Pt states on Thursday night he began to have sinus pressure, headache, cough, runny nose and soreness in back. COVID test performed at Urgent Care and came back positive. Pt was prescribed Tessalon, nasal spray and Zyrtec by PA at Urgent Care. Pt has been taking Tylenol and sinus meds.   Virtual Visit via Video Note  I connected with [name] on 09/14/19 at  2:00 PM EST by a video enabled telemedicine application and verified that I am speaking with the correct person using two identifiers.  Location: Patient: home Provider: office   I discussed the limitations of evaluation and management by telemedicine and the availability of in person appointments. The patient expressed understanding and agreed to proceed.  History of Present Illness:    Observations/Objective:   Assessment and Plan:   Follow Up Instructions:    I discussed the assessment and treatment plan with the patient. The patient was provided an opportunity to ask questions and all were answered. The patient agreed with the plan and demonstrated an understanding of the instructions.   The patient was advised to call back or seek an in-person evaluation if the symptoms worsen or if the condition fails to improve as anticipated.  I provided 18 minutes of non-face-to-face time during this encounter.   Vicente Males, LPN    Review of Systems See above    Objective:   Physical Exam  Virtual      Assessment & Plan:  Impression known COVID-19 infection.  With multiple risk factors.  Discussed.  Symptom care discussed.  Warning signs discussed.  Recommended strong consideration towards immunoglobulin infusion rationale discussed numerous questions answered

## 2019-09-15 ENCOUNTER — Other Ambulatory Visit: Payer: Self-pay | Admitting: Nurse Practitioner

## 2019-09-15 DIAGNOSIS — I10 Essential (primary) hypertension: Secondary | ICD-10-CM

## 2019-09-15 DIAGNOSIS — U071 COVID-19: Secondary | ICD-10-CM

## 2019-09-15 NOTE — Progress Notes (Signed)
  I connected by phone with Jared Griffin on 09/15/2019 at 1:21 PM to discuss the potential use of an new treatment for mild to moderate COVID-19 viral infection in non-hospitalized patients.  This patient is a 58 y.o. male that meets the FDA criteria for Emergency Use Authorization of bamlanivimab or casirivimab\imdevimab.  Has a (+) direct SARS-CoV-2 viral test result  Has mild or moderate COVID-19   Is ? 57 years of age and weighs ? 40 kg  Is NOT hospitalized due to COVID-19  Is NOT requiring oxygen therapy or requiring an increase in baseline oxygen flow rate due to COVID-19  Is within 10 days of symptom onset  Has at least one of the high risk factor(s) for progression to severe COVID-19 and/or hospitalization as defined in EUA.  Specific high risk criteria : Hypertension   I have spoken and communicated the following to the patient or parent/caregiver:  1. FDA has authorized the emergency use of bamlanivimab and casirivimab\imdevimab for the treatment of mild to moderate COVID-19 in adults and pediatric patients with positive results of direct SARS-CoV-2 viral testing who are 34 years of age and older weighing at least 40 kg, and who are at high risk for progressing to severe COVID-19 and/or hospitalization.  2. The significant known and potential risks and benefits of bamlanivimab and casirivimab\imdevimab, and the extent to which such potential risks and benefits are unknown.  3. Information on available alternative treatments and the risks and benefits of those alternatives, including clinical trials.  4. Patients treated with bamlanivimab and casirivimab\imdevimab should continue to self-isolate and use infection control measures (e.g., wear mask, isolate, social distance, avoid sharing personal items, clean and disinfect "high touch" surfaces, and frequent handwashing) according to CDC guidelines.   5. The patient or parent/caregiver has the option to accept or refuse  bamlanivimab or casirivimab\imdevimab .  After reviewing this information with the patient, The patient agreed to proceed with receiving the bamlanimivab infusion and will be provided a copy of the Fact sheet prior to receiving the infusion.Fenton Foy 09/15/2019 1:21 PM

## 2019-09-16 ENCOUNTER — Ambulatory Visit (HOSPITAL_COMMUNITY): Payer: PRIVATE HEALTH INSURANCE | Admitting: Psychiatry

## 2019-09-17 ENCOUNTER — Ambulatory Visit (HOSPITAL_COMMUNITY)
Admission: RE | Admit: 2019-09-17 | Discharge: 2019-09-17 | Disposition: A | Discharge status: Home / Self Care | Payer: PRIVATE HEALTH INSURANCE | Source: Ambulatory Visit | Attending: Pulmonary Disease | Admitting: Pulmonary Disease

## 2019-09-17 DIAGNOSIS — I10 Essential (primary) hypertension: Secondary | ICD-10-CM

## 2019-09-17 DIAGNOSIS — U071 COVID-19: Secondary | ICD-10-CM | POA: Diagnosis present

## 2019-09-17 MED ORDER — FAMOTIDINE IN NACL 20-0.9 MG/50ML-% IV SOLN: 20.0000 mg | Freq: Once | INTRAVENOUS | Status: DC | PRN

## 2019-09-17 MED ORDER — DIPHENHYDRAMINE HCL 50 MG/ML IJ SOLN: 50.0000 mg | Freq: Once | INTRAMUSCULAR | Status: DC | PRN

## 2019-09-17 MED ORDER — METHYLPREDNISOLONE SODIUM SUCC 125 MG IJ SOLR: 125.0000 mg | Freq: Once | INTRAMUSCULAR | Status: DC | PRN

## 2019-09-17 MED ORDER — SODIUM CHLORIDE 0.9 % IV SOLN
700.0000 mg | Freq: Once | INTRAVENOUS | Status: AC
  Administered 2019-09-17: 700 mg via INTRAVENOUS
  Filled 2019-09-17: qty 20

## 2019-09-17 MED ORDER — ALBUTEROL SULFATE HFA 108 (90 BASE) MCG/ACT IN AERS: 2.0000 | INHALATION_SPRAY | Freq: Once | RESPIRATORY_TRACT | Status: DC | PRN

## 2019-09-17 MED ORDER — SODIUM CHLORIDE 0.9 % IV SOLN
INTRAVENOUS | Status: DC | PRN
  Administered 2019-09-17: 11:00:00 250 mL via INTRAVENOUS

## 2019-09-17 MED ORDER — EPINEPHRINE 0.3 MG/0.3ML IJ SOAJ: 0.3000 mg | Freq: Once | INTRAMUSCULAR | Status: DC | PRN

## 2019-09-17 NOTE — Progress Notes (Signed)
  Diagnosis: COVID-19  Physician: Grace Bushy. Luking. MD  Procedure: [facility] Infusion [facility] Med: bamlanivimab infusion - Provided patient with bamlanimivab fact sheet for patients, parents and caregivers prior to infusion.  Complications: No immediate complications noted.  Discharge: Discharged home   Gaye Alken 09/17/2019

## 2019-09-17 NOTE — Discharge Instructions (Signed)

## 2019-09-22 ENCOUNTER — Other Ambulatory Visit: Payer: PRIVATE HEALTH INSURANCE

## 2019-09-24 ENCOUNTER — Ambulatory Visit (INDEPENDENT_AMBULATORY_CARE_PROVIDER_SITE_OTHER): Payer: PRIVATE HEALTH INSURANCE | Admitting: Family Medicine

## 2019-09-24 ENCOUNTER — Ambulatory Visit (INDEPENDENT_AMBULATORY_CARE_PROVIDER_SITE_OTHER): Payer: PRIVATE HEALTH INSURANCE | Admitting: "Endocrinology

## 2019-09-24 ENCOUNTER — Encounter: Payer: Self-pay | Admitting: "Endocrinology

## 2019-09-24 ENCOUNTER — Other Ambulatory Visit: Payer: Self-pay

## 2019-09-24 ENCOUNTER — Telehealth: Payer: Self-pay | Admitting: Family Medicine

## 2019-09-24 VITALS — BP 109/73 | HR 91 | Ht 74.5 in | Wt 261.0 lb

## 2019-09-24 DIAGNOSIS — J019 Acute sinusitis, unspecified: Secondary | ICD-10-CM

## 2019-09-24 DIAGNOSIS — E782 Mixed hyperlipidemia: Secondary | ICD-10-CM | POA: Diagnosis not present

## 2019-09-24 DIAGNOSIS — E1159 Type 2 diabetes mellitus with other circulatory complications: Secondary | ICD-10-CM

## 2019-09-24 DIAGNOSIS — I10 Essential (primary) hypertension: Secondary | ICD-10-CM | POA: Diagnosis not present

## 2019-09-24 MED ORDER — AZITHROMYCIN 250 MG PO TABS: ORAL_TABLET | ORAL | 0 refills | Status: DC

## 2019-09-24 MED ORDER — FREESTYLE LIBRE 14 DAY SENSOR MISC: 1.0000 | 2 refills | Status: DC

## 2019-09-24 MED ORDER — LANCETS MISC: 1.0000 | Freq: Four times a day (QID) | 5 refills | Status: AC

## 2019-09-24 MED ORDER — FREESTYLE LIBRE 14 DAY READER DEVI: 1.0000 | Freq: Once | 0 refills | Status: AC

## 2019-09-24 MED ORDER — NOVOLOG FLEXPEN 100 UNIT/ML ~~LOC~~ SOPN: 15.0000 [IU] | PEN_INJECTOR | Freq: Three times a day (TID) | SUBCUTANEOUS | 2 refills | Status: DC

## 2019-09-24 MED ORDER — CONTOUR NEXT TEST VI STRP: 1.0000 | ORAL_STRIP | Freq: Four times a day (QID) | 5 refills | Status: DC

## 2019-09-24 MED ORDER — TOUJEO MAX SOLOSTAR 300 UNIT/ML ~~LOC~~ SOPN: 130.0000 [IU] | PEN_INJECTOR | Freq: Every day | SUBCUTANEOUS | 2 refills | Status: DC

## 2019-09-24 NOTE — Patient Instructions (Signed)

## 2019-09-24 NOTE — Telephone Encounter (Signed)
Left message to return call 

## 2019-09-24 NOTE — Telephone Encounter (Signed)
Patient added to Dr Bary Leriche schedule for phone visit this afternoon.

## 2019-09-24 NOTE — Telephone Encounter (Signed)
Pt had +Covid test 09/13/2019 - pt still having nasal congestion (yellow brown in color). Chest congestion & cough (clear/yellow at times), ear pressure  NO fever  Wonders if we can call something in?  Please advise & call pt    [place]

## 2019-09-24 NOTE — Progress Notes (Signed)
   Subjective:    Patient ID: Jared Griffin, male    DOB: Oct 22, 1960, 59 y.o.   MRN: [id number]  Cough This is a new problem. The current episode started 1 to 4 weeks ago. Associated symptoms include rhinorrhea and wheezing. Pertinent negatives include no chest pain, chills, ear pain or fever.  Patient is Covid positive He did have antibody infusion He relates a lot of fatigue today.  Gets out of breath moving around.  States he had a couple things he had to do today and just got short of breath with doing them and feels tired also relates some coughing but denies any nausea or vomiting.  No diarrhea.  Does have risk factors. Patient positive for Covid 09/13/2019   Review of Systems  Constitutional: Negative for activity change, chills and fever.  HENT: Positive for congestion and rhinorrhea. Negative for ear pain.   Eyes: Negative for discharge.  Respiratory: Positive for cough and wheezing.   Cardiovascular: Negative for chest pain.  Gastrointestinal: Negative for nausea and vomiting.  Musculoskeletal: Negative for arthralgias.   Virtual Visit via Video Note  I connected with [name] on 09/24/19 at  4:30 PM EST by a video enabled telemedicine application and verified that I am speaking with the correct person using two identifiers.  Location: Patient: home Provider: office   I discussed the limitations of evaluation and management by telemedicine and the availability of in person appointments. The patient expressed understanding and agreed to proceed.  History of Present Illness:    Observations/Objective:   Assessment and Plan:   Follow Up Instructions:    I discussed the assessment and treatment plan with the patient. The patient was provided an opportunity to ask questions and all were answered. The patient agreed with the plan and demonstrated an understanding of the instructions.   The patient was advised to call back or seek an in-person  evaluation if the symptoms worsen or if the condition fails to improve as anticipated.  I provided 18 minutes of non-face-to-face time during this encounter.        Objective:   Physical Exam   Today's visit was via telephone Physical exam was not possible for this visit Patient is able to talk in complete sentences does not sound out of breath     Assessment & Plan:  Recent positive Covid Patient with significant cough and shortness of breath with activity We will go ahead and treat with an antibiotic for possibility of secondary bacterial infection I also was able to go by the patient's house and give his wife O2 sat meter She checked his O2 saturation it was 95% They will monitor it closely if it drops into the 80s they will go to the ER.  They are to check the oxygen saturation multiple times every day until doing better.  If any concerns or problems they are to call

## 2019-09-24 NOTE — Telephone Encounter (Signed)
Seen on 12/28. States he does feel better. States he had to go to the dentist and dr nida and when he got back he was exhausted. Some sob when moving around. States he hasn't been out in a couple of weeks and going out today wore him out. No fever. Still having some congestion, nose is pouring - clear - yellowish brown. Some coughing - thick clear and sometimes has color to it. No fever.

## 2019-09-24 NOTE — Telephone Encounter (Signed)
Please add the patient to the list for today I can do a phone call visit with him but I have to speak with him from a medical thoroughness standpoint before prescribing

## 2019-09-24 NOTE — Progress Notes (Signed)
09/24/2019, 5:22 PM    Endocrinology follow-up note   Subjective:    Patient ID: Jared Griffin, male    DOB: 1960/12/07.  Jared Griffin is being seen in follow-up  for management of currently uncontrolled symptomatic type 2 diabetes, hyperlipidemia, hypertension.   PMD:  Mikey Kirschner, MD.   Past Medical History:  Diagnosis Date  . Anginal pain ([place])   . Anxiety   . 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 COPD (chronic obstructive pulmonary disease) ([place])    very close to being diagnosed  . Coronary artery disease   . Coronary atherosclerosis of native coronary artery    a. 12/09/2013: s/p PCI with 3.0 x 28 Promus DES to mLAD  . Cyst (solitary) of breast 10/03/2017   removed from back  . Depression   . Dyspnea    take inhaler  . Elbow injury 07/25/2017   surgery for torn tendon  . Essential hypertension, benign   . Fatty liver disease, nonalcoholic   . Lateral epicondylitis of right elbow   . Mixed hyperlipidemia 2006  . Obesity   . Osgood-Schlatter's disease of right knee   . Type 2 diabetes mellitus ([place])    Past Surgical History:  Procedure Laterality Date  . BIOPSY  01/21/2019   Procedure: BIOPSY;  Surgeon: [name], MD;  Location: [facility];  Service: Endoscopy;;  gastric bx and gastric polyps  . CARDIAC CATHETERIZATION  2011  . CARDIAC CATHETERIZATION N/A 09/20/2015   Procedure: Left Heart Cath and Coronary Angiography;  Surgeon: [name], MD;  Location: [place] [facility];  Service: Cardiovascular;  Laterality: N/A;  . CHOLECYSTECTOMY  2003  . COLONOSCOPY  06/19/2012   Procedure: COLONOSCOPY;  Surgeon: [name], MD;  Location: [facility];  Service: Endoscopy;  Laterality: N/A;  930  . COLONOSCOPY N/A 10/17/2017   Procedure: COLONOSCOPY;  Surgeon: [name], MD;  Location: [facility];  Service: Endoscopy;  Laterality: N/A;  1225  . CYST EXCISION  07/2019  .  ESOPHAGOGASTRODUODENOSCOPY (EGD) WITH PROPOFOL N/A 01/21/2019   Procedure: ESOPHAGOGASTRODUODENOSCOPY (EGD) WITH PROPOFOL;  Surgeon: [name], MD;  [facility];  Service: Endoscopy;  Laterality: N/A;  10:15am  . KNEE ARTHROPLASTY Right 1999  . LEFT HEART CATH AND CORONARY ANGIOGRAPHY N/A 02/26/2018   Procedure: LEFT HEART CATH AND CORONARY ANGIOGRAPHY;  Surgeon: [name], MD;  Location: [place] [facility];  Service: Cardiovascular;  Laterality: N/A;  . LEFT HEART CATHETERIZATION WITH CORONARY ANGIOGRAM N/A 12/09/2013   Procedure: LEFT HEART CATHETERIZATION WITH CORONARY ANGIOGRAM;  Surgeon: [name], MD;  Location: [facility] CATH LAB;  Service: Cardiovascular;  Laterality: N/A;  . Liver biopsy    . POLYPECTOMY  10/17/2017   Procedure: POLYPECTOMY;  Surgeon: [name], MD;  [facility];  Service: Endoscopy;;  ascending colon, splenic flexure,sigmoid x2  . TENNIS ELBOW RELEASE/NIRSCHEL PROCEDURE Right 07/25/2017   Procedure: TENNIS ELBOW RELEASE and debriedment;  Surgeon: [name], MD;  Location: [facility];  Service: Orthopedics;  Laterality: Right;  . TONSILLECTOMY  1970's   Social History   Socioeconomic History  . Marital status: Married    Spouse name: Not on file  . Number of children: Not on file  . Years of education: Not on file  . Highest education level: Not on file  Occupational History  . Not on file  Tobacco Use  . Smoking status: Former Smoker    Packs/day: 0.50    Years: 10.00    Pack years: 5.00    Types: Cigarettes    Start date: 02/02/1973    Quit date: 09/17/1983    Years since quitting: 36.0  . Smokeless tobacco: Former Systems developer    Types: [place] date: 09/17/1983  Substance and Sexual Activity  . Alcohol use: No    Alcohol/week: 0.0 standard drinks  . Drug use: No  . Sexual activity: Yes  Other Topics Concern  . 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    Married with 1 child   Right handed    12 th    Very little caffeine   Social Determinants of Health   Financial Resource Strain: Low Risk   . Difficulty of Paying Living Expenses: Not hard at all  Food Insecurity: No Food Insecurity  . Worried About Charity fundraiser in the Last Year: Never true  . Ran Out of Food in the Last Year: Never true  Transportation Needs: No Transportation Needs  . Lack of Transportation (Medical): No  . Lack of Transportation (Non-Medical): No  Physical Activity: Unknown  . Days of Exercise per Week: Patient refused  . Minutes of Exercise per Session: Patient refused  Stress: Stress Concern Present  . Feeling of Stress : To some extent  Social Connections: Unknown  . Frequency of Communication with Friends and Family: Patient refused  . Frequency of Social Gatherings with Friends and Family: Patient refused  . Attends Religious Services: Patient refused  . Active Member of Clubs or Organizations: Patient refused  . Attends Archivist Meetings: Patient refused  . Marital Status: Patient refused   Outpatient Encounter Medications as of 09/24/2019  Medication Sig  . glucose blood (CONTOUR NEXT TEST) test strip 1 each by Other route 4 (four) times daily. Use as instructed  . [DISCONTINUED] glucose blood (CONTOUR NEXT TEST) test strip 1 each by Other route 4 (four) times daily. Use as instructed  . albuterol (VENTOLIN HFA) 108 (90 Base) MCG/ACT inhaler INHALE 2 PUFFS INTO LUNGS EVERY 6 HOURS AS NEEDED FOR WHEEZING AND SHORTNESS OF BREATH.  Marland Kitchen ALPRAZolam (XANAX) 0.5 MG tablet Take 1 tablet (0.5 mg total) by mouth 3 (three) times daily as needed for sleep or anxiety.  Marland Kitchen aspirin (ASPIR-LOW) 81 MG EC tablet Take 1 tablet (81 mg total) by mouth daily.  Marland Kitchen atorvastatin (LIPITOR) 40 MG tablet TAKE (1) TABLET BY MOUTH ONCE DAILY.  Marland Kitchen B-D UF III MINI PEN NEEDLES 31G X 5 MM MISC USE WITH TRESIBA INSULIN DAILY AS  DIRECTED.  Marland Kitchen benzonatate (TESSALON) 100 MG capsule Take 1 capsule (100 mg total) by mouth every 8 (eight) hours.  . celecoxib (CELEBREX) 200 MG capsule Take 1 capsule (200 mg total) by mouth 2 (two) times daily.  . cetirizine (ZYRTEC) 10 MG tablet Take 1 tablet (10 mg total) by mouth daily.  . Cholecalciferol (VITAMIN D3) 125 MCG (5000 UT) CAPS Take 1 capsule (5,000 Units total) by mouth daily.  . Continuous Blood Gluc Receiver (FREESTYLE LIBRE 14 DAY READER) DEVI 1 each by Does not apply route once for 1 dose.  . Continuous Blood Gluc Sensor (FREESTYLE LIBRE 14 DAY SENSOR) MISC Inject 1 each into the skin every 14 (fourteen) days. Use as directed.  . diclofenac (CATAFLAM) 50 MG  tablet Take 1 tablet (50 mg total) by mouth 2 (two) times daily.  Marland Kitchen diltiazem (CARDIZEM) 30 MG tablet TAKE (1) TABLET BY MOUTH TWICE DAILY.  . fluticasone (FLONASE) 50 MCG/ACT nasal spray Place 2 sprays into both nostrils daily.  . fluticasone (FLOVENT HFA) 110 MCG/ACT inhaler Inhale 2 puffs into the lungs 2 (two) times a day.  . gabapentin (NEURONTIN) 300 MG capsule Take 1 capsule (300 mg total) by mouth 4 (four) times daily.  Marland Kitchen glipiZIDE (GLUCOTROL XL) 5 MG 24 hr tablet TAKE (2) TABLETS BY MOUTH ONCE DAILY WITH BREAKFAST.  Marland Kitchen HYDROcodone-acetaminophen (NORCO/VICODIN) 5-325 MG tablet Take 1 tablet by mouth every 6 (six) hours as needed for moderate pain.  Vanessa Kick Ethyl (VASCEPA) 1 g CAPS Take 2 capsules (2 g total) by mouth 2 (two) times a day.  . insulin aspart (NOVOLOG FLEXPEN) 100 UNIT/ML FlexPen Inject 15-21 Units into the skin 3 (three) times daily before meals.  . Insulin Glargine, 2 Unit Dial, (TOUJEO MAX SOLOSTAR) 300 UNIT/ML SOPN Inject 130 Units into the skin at bedtime.  . Lancets MISC 1 each by Does not apply route 4 (four) times daily.  Marland Kitchen losartan (COZAAR) 25 MG tablet TAKE (1) TABLET BY MOUTH ONCE DAILY.  . meclizine (ANTIVERT) 25 MG tablet Take 1 tablet (25 mg total) by mouth 3 (three) times daily as  needed for dizziness.  . metFORMIN (GLUCOPHAGE) 500 MG tablet Take 2 tablets (1,000 mg total) by mouth 2 (two) times daily with a meal.  . nitroGLYCERIN (NITROSTAT) 0.4 MG SL tablet Place 1 tablet (0.4 mg total) under the tongue every 5 (five) minutes as needed for chest pain.  . pantoprazole (PROTONIX) 40 MG tablet Take one daily  . ranolazine (RANEXA) 1000 MG SR tablet TAKE (1) TABLET BY MOUTH TWICE DAILY.  Marland Kitchen sertraline (ZOLOFT) 100 MG tablet Take 1.5 tablets (150 mg total) by mouth daily.  . traZODone (DESYREL) 50 MG tablet Take 1 tablet (50 mg total) by mouth at bedtime.  . [DISCONTINUED] insulin aspart (NOVOLOG FLEXPEN) 100 UNIT/ML FlexPen Inject 10 Units into the skin 3 (three) times daily before meals.  . [DISCONTINUED] Insulin Glargine, 2 Unit Dial, (TOUJEO MAX SOLOSTAR) 300 UNIT/ML SOPN Inject 140 Units into the skin at bedtime.  . [DISCONTINUED] Lancets MISC 1 each by Does not apply route 4 (four) times daily.  . [DISCONTINUED] lidocaine (LIDODERM) 5 % Place 1 patch onto the skin every 12 (twelve) hours. Remove & Discard patch within 12 hours or as directed by MD  . [DISCONTINUED] ONETOUCH ULTRA test strip TESTING 4 TIMES DAILY.  . [DISCONTINUED] predniSONE (STERAPRED UNI-PAK 48 TAB) 10 MG (48) TBPK tablet Take by mouth daily.   No facility-administered encounter medications on file as of 09/24/2019.    ALLERGIES: Allergies  Allergen Reactions  . Metoprolol Rash    VACCINATION STATUS: Immunization History  Administered Date(s) Administered  . Influenza,inj,Quad PF,6+ Mos 07/11/2016, 08/02/2017, 08/05/2018, 08/10/2019  . Influenza-Unspecified 06/18/2013, 06/17/2014, 07/06/2015  . Td 01/28/2014    Diabetes He presents for his follow-up diabetic visit. He has type 2 diabetes mellitus. Onset time: He was diagnosed at approximate age of 83 years. His disease course has been worsening. There are no hypoglycemic associated symptoms. Pertinent negatives for hypoglycemia include no  confusion, headaches, pallor or seizures. Associated symptoms include polydipsia and polyuria. Pertinent negatives for diabetes include no chest pain, no fatigue, no polyphagia and no weakness. There are no hypoglycemic complications. Symptoms are improving. Diabetic complications include heart disease. Risk factors  for coronary artery disease include diabetes mellitus, dyslipidemia, hypertension, male sex, obesity, sedentary lifestyle and tobacco exposure. Current diabetic treatment includes oral agent (dual therapy) (He is currently on Invokana 300 mg p.o. daily, metformin 500 mg p.o. twice daily, glipizide 5 mg p.o. twice daily.). His weight is increasing steadily. He is following a generally unhealthy diet. When asked about meal planning, he reported none. He has not had a previous visit with a dietitian. He rarely participates in exercise. His home blood glucose trend is increasing steadily. His breakfast blood glucose range is generally 180-200 mg/dl. His lunch blood glucose range is generally >200 mg/dl. His dinner blood glucose range is generally >200 mg/dl. His bedtime blood glucose range is generally >200 mg/dl. His overall blood glucose range is >200 mg/dl. (His fasting blood glucose from the most recent :  131, 138, 80, 202, 238, 130, 98, 162, 209 His lunchtime readings from the most recent: 321, 368, 264, 331, 298, 313, 268, 155, 401 Bedtime readings from the most recent: 277, 268, 312, 227, 350, 247, 314, 238, 378) An ACE inhibitor/angiotensin II receptor blocker is being taken. Eye exam is current.  Hyperlipidemia This is a chronic problem. The problem is uncontrolled (He has severe hypertriglyceridemia at 1157.). Exacerbating diseases include diabetes and obesity. Pertinent negatives include no chest pain, myalgias or shortness of breath. Risk factors for coronary artery disease include diabetes mellitus, dyslipidemia, hypertension, male sex, obesity and a sedentary lifestyle.   Hypertension This is a chronic problem. The current episode started more than 1 year ago. The problem is controlled. Pertinent negatives include no chest pain, headaches, neck pain, palpitations or shortness of breath. Risk factors for coronary artery disease include diabetes mellitus, dyslipidemia, male gender, obesity, sedentary lifestyle and smoking/tobacco exposure. Hypertensive end-organ damage includes CAD/MI.   Review of systems:  Constitutional: + weight gain, no fatigue, no subjective hyperthermia, no subjective hypothermia Eyes: no blurry vision, no xerophthalmia ENT: no sore throat, no nodules palpated in throat, no dysphagia/odynophagia, no hoarseness Cardiovascular: no Chest Pain, no Shortness of Breath, no palpitations, no leg swelling Respiratory: no cough, no SOB Gastrointestinal: no Nausea/Vomiting/Diarhhea Musculoskeletal: no muscle/joint aches Skin: no rashes Neurological: no tremors, no numbness, no tingling, no dizziness Psychiatric: no depression, no anxiety    Objective:    BP 109/73   Pulse 91   Ht 6' 2.5" (1.892 m)   Wt 261 lb (118.4 kg)   BMI 33.06 kg/m   Wt Readings from Last 3 Encounters:  09/24/19 261 lb (118.4 kg)  08/26/19 250 lb (113.4 kg)  08/10/19 262 lb (118.8 kg)     Physical Exam- Limited  Constitutional:  Body mass index is 33.06 kg/m. , not in acute distress, normal state of mind Eyes:  EOMI, no exophthalmos Neck: Supple Respiratory: Adequate breathing efforts Musculoskeletal: no gross deformities, strength intact in all four extremities, no gross restriction of joint movements Skin:  no rashes, no hyperemia Neurological: no tremor with outstretched hands.  CMP     Component Value Date/Time   NA 139 08/20/2019 0937   K 4.1 08/20/2019 0937   CL 98 08/20/2019 0937   CO2 25 08/20/2019 0937   GLUCOSE 184 (H) 08/20/2019 0937   GLUCOSE 298 (H) 01/04/2019 1016   BUN 16 08/20/2019 0937   CREATININE 0.93 08/20/2019 0937   CREATININE  0.96 12/07/2013 0950   CALCIUM 9.6 08/20/2019 0937   PROT 7.0 08/20/2019 0937   ALBUMIN 4.6 08/20/2019 0937   AST 31 08/20/2019 0937   ALT  53 (H) 08/20/2019 0937   ALKPHOS 52 08/20/2019 0937   BILITOT 0.6 08/20/2019 0937   GFRNONAA 91 08/20/2019 0937   GFRAA 105 08/20/2019 0937    Diabetic Labs (most recent): Lab Results  Component Value Date   HGBA1C 9.1 (H) 08/20/2019   HGBA1C 9.1 08/20/2019   HGBA1C 8.9 (H) 04/15/2019     Lipid Panel ( most recent) Lipid Panel     Component Value Date/Time   CHOL 116 10/18/2018 0903   TRIG 173 (H) 10/18/2018 0903   HDL 30 (L) 10/18/2018 0903   CHOLHDL 3.9 10/18/2018 0903   CHOLHDL 6.4 07/12/2018 0713   VLDL UNABLE TO CALCULATE IF TRIGLYCERIDE OVER 400 mg/dL 07/12/2018 0713   LDLCALC 51 10/18/2018 0903      Lab Results  Component Value Date   TSH 0.648 08/20/2019   TSH 0.65 08/20/2019   TSH 0.525 07/12/2018   TSH 0.458 04/13/2017   TSH 0.493 02/05/2014   FREET4 1.35 08/20/2019      Assessment & Plan:   1. DM type 2 causing vascular disease ([address])  - [name] has currently uncontrolled symptomatic type 2 DM since 59 years of age. -He is reporting significantly above target glycemic profile and A1c of 9.1% increasing from 8.9%.  -He  Was recently initiated on basal/bolus insulin. He returns with slightly better, still significantly above target postprandial glycemic profile despite higher dose of basal insulin. See scanned readings. -his diabetes is complicated by coronary artery disease, obesity/sedentary life, history of smoking and he remains at a high risk for more acute and chronic complications which include CAD, CVA, CKD, retinopathy, and neuropathy. These are all discussed in detail with him.  - I have counseled him on diet management and weight loss, by adopting a carbohydrate restricted/protein rich diet.  - he  admits there is a room for improvement in his diet and drink choices. -  Suggestion is made  for him to avoid simple carbohydrates  from his diet including Cakes, Sweet Desserts / Pastries, Ice Cream, Soda (diet and regular), Sweet Tea, Candies, Chips, Cookies, Sweet Pastries,  Store Bought Juices, Alcohol in Excess of  1-2 drinks a day, Artificial Sweeteners, Coffee Creamer, and "Sugar-free" Products. This will help patient to have stable blood glucose profile and potentially avoid unintended weight gain.  - I encouraged him to switch to  unprocessed or minimally processed complex starch and increased protein intake (animal or plant source), fruits, and vegetables.  - he is advised to stick to a routine mealtimes to eat 3 meals  a day and avoid unnecessary snacks ( to snack only to correct hypoglycemia).   - he has been scheduled with Jearld Fenton, RDN, CDE for individualized diabetes education.  - I have approached him with the following individualized plan to manage diabetes and patient agrees:    -Due to the fact that he continues to have significantly above target glycemic profile postprandially, he will continue to need intensive treatment with basal/bolus insulin. -He is advised to lower  Toujeo max to 130 units subcutaneously nightly, discussed and increased NovoLog to 15 units 3 times daily AC for Premeal blood glucose readings above 90 mg per DL, approached to continue monitoring blood glucose 4 times a day-before meals and at bedtime and he is advised to return to office in 10 days with his meter and logs.   - he is encouraged to call clinic for blood glucose levels less than 70 or above 200 mg /dl. -He is  advised to continue Metformin 1000 mg p.o. twice daily, glipizide 10 mg XL p.o. daily at breakfast.    -Due to severe hypertriglyceridemia,  he is not a suitable candidate for incretin therapy for now-insulin treatment would also help.   - Patient specific target  A1c;  LDL, HDL, Triglycerides, and  Waist Circumference were discussed in detail.  2) BP/HTN:  - His blood  pressure is controlled to target.  he is advised to continue his current medications including losartan 25 mg p.o. daily with breakfast .  3) Lipids/HPL:   Review of his recent lipid panel showed unknown LDL due to severe hypertriglyceridemia of 1157+. -He is advised to continue atorvastatin 40 mg p.o. nightly ,  he is also on Vascepa 2 g p.o. twice daily.  He is advised to be consistent on his medications.    4)  Weight/Diet: His BMI 33- I discussed with him the fact that loss of 5 - 10% of his  current body weight will have the most impact on his diabetes management.  CDE Consult will be initiated . Exercise, and detailed carbohydrates information provided  -  detailed on discharge instructions.  5) Chronic Care/Health Maintenance:  -he  is on ACEI/ARB and Statin medications and  is encouraged to initiate and continue to follow up with Ophthalmology, Dentist,  Podiatrist at least yearly or according to recommendations, and advised to   stay away from smoking. I have recommended yearly flu vaccine and pneumonia vaccine at least every 5 years; moderate intensity exercise for up to 150 minutes weekly; and  sleep for at least 7 hours a day.  - I advised patient to maintain close follow up with Mikey Kirschner, MD for primary care needs.  - Time spent on this patient care encounter:  35 min, of which > 50% was spent in  counseling and the rest reviewing his blood glucose logs , discussing his hypoglycemia and hyperglycemia episodes, reviewing his current and  previous labs / studies  ( including abstraction from other facilities) and medications  doses and developing a  long term treatment plan and documenting his care.   Please refer to Patient Instructions for Blood Glucose Monitoring and Insulin/Medications Dosing Guide"  in media tab for additional information. Please  also refer to " Patient Self Inventory" in the Media  tab for reviewed elements of pertinent patient history.  [name] participated in the discussions, expressed understanding, and voiced agreement with the above plans.  All questions were answered to his satisfaction. he is encouraged to contact clinic should he have any questions or concerns prior to his return visit.   Follow up plan: - Return in about 3 months (around 12/23/2019) for Bring Meter and Logs- A1c in Office.  Glade Lloyd, MD [facility] [address] [place], [place] [address] Phone: [phone]  Fax: [phone]    09/24/2019, 5:22 PM  This note was partially dictated with voice recognition software. Similar sounding words can be transcribed inadequately or may not  be corrected upon review.

## 2019-10-07 ENCOUNTER — Other Ambulatory Visit: Payer: Self-pay

## 2019-10-07 ENCOUNTER — Ambulatory Visit (HOSPITAL_COMMUNITY)
Admission: RE | Admit: 2019-10-07 | Discharge: 2019-10-07 | Disposition: A | Discharge status: Home / Self Care | Payer: PRIVATE HEALTH INSURANCE | Source: Ambulatory Visit | Attending: Internal Medicine | Admitting: Internal Medicine

## 2019-10-07 DIAGNOSIS — K760 Fatty (change of) liver, not elsewhere classified: Secondary | ICD-10-CM

## 2019-10-08 ENCOUNTER — Other Ambulatory Visit: Payer: Self-pay | Admitting: Cardiovascular Disease

## 2019-10-10 ENCOUNTER — Ambulatory Visit
Admission: RE | Admit: 2019-10-10 | Discharge: 2019-10-10 | Disposition: A | Discharge status: Home / Self Care | Payer: PRIVATE HEALTH INSURANCE | Source: Ambulatory Visit | Attending: Orthopedic Surgery | Admitting: Orthopedic Surgery

## 2019-10-10 ENCOUNTER — Other Ambulatory Visit: Payer: Self-pay

## 2019-10-10 DIAGNOSIS — G8929 Other chronic pain: Secondary | ICD-10-CM

## 2019-10-15 ENCOUNTER — Telehealth: Payer: Self-pay | Admitting: Orthopedic Surgery

## 2019-10-15 NOTE — Telephone Encounter (Signed)
Yes, Dr [name] will call him, it is even documented in Dr Althia Forts note, he will call him, with report. Thanks

## 2019-10-15 NOTE — Telephone Encounter (Signed)
Jared Griffin called today stating he went for his MRI on Saturday at [facility].  He wants to know if someone could call him with the results and advise him as to what his next step is.  Please give him a call  Thanks

## 2019-10-21 ENCOUNTER — Encounter: Payer: Self-pay | Admitting: Family Medicine

## 2019-10-22 NOTE — Telephone Encounter (Signed)
Patient just called stating that he hasn't heard from our office about his MRI results.  Please advise

## 2019-10-22 NOTE — Telephone Encounter (Signed)
IMPRESSION: Transitional lumbosacral anatomy. Please see numbering scheme above and correlate with plain films if any intervention is planned.  Mild lumbar degenerative change without central canal or foraminal narrowing. Scattered facet arthropathy is most notable at L3-4.

## 2019-11-02 ENCOUNTER — Telehealth: Payer: Self-pay | Admitting: Orthopedic Surgery

## 2019-11-02 NOTE — Telephone Encounter (Signed)
Patient states he has not received his MRI results; I verified phone numbers and reminded patient about any blocks he may have on his phone, in event he was unable to receive Dr's call.   [phone] (Mobile)  Preferred [phone] [facility] Phone

## 2019-11-03 ENCOUNTER — Telehealth: Payer: Self-pay | Admitting: Radiology

## 2019-11-03 ENCOUNTER — Other Ambulatory Visit: Payer: Self-pay | Admitting: Orthopedic Surgery

## 2019-11-03 ENCOUNTER — Other Ambulatory Visit: Payer: Self-pay | Admitting: "Endocrinology

## 2019-11-03 DIAGNOSIS — G8929 Other chronic pain: Secondary | ICD-10-CM

## 2019-11-03 MED ORDER — HYDROCODONE-ACETAMINOPHEN 5-325 MG PO TABS: 1.0000 | ORAL_TABLET | Freq: Four times a day (QID) | ORAL | 0 refills | Status: DC | PRN

## 2019-11-03 NOTE — Telephone Encounter (Signed)
Message left 10am

## 2019-11-03 NOTE — Telephone Encounter (Signed)
-----   Message from [name], MD sent at 11/03/2019  3:37 PM EST ----- I just spoke with Mr. Rogan I told him we would send him for some epidural injections but I would like him to get facet joint injection right L3-4 x3

## 2019-11-03 NOTE — Telephone Encounter (Signed)
[name] had an MRI it was not very impressive overall.  He had some degenerative disc changes and some facet arthritis.  I spoke with him about it.  He says he is having some giving way episodes of the right leg.  He is on gabapentin for peripheral neuropathy  He is NSAID intolerant  He says he having severe pain in his right hip radiating to his right knee  I looked at his [facility] pelvis again to see if I missed any arthritic changes that would cause him severe pain and I did not see anything of that nature.  So he is going to go ahead with epidural injections pursue Facet Injections Would Be a Better Choice for Him  And I Gave Him Some Pain Medication since He Cannot Take Any Other on Opioid Medicines

## 2019-11-04 ENCOUNTER — Ambulatory Visit (INDEPENDENT_AMBULATORY_CARE_PROVIDER_SITE_OTHER): Payer: PRIVATE HEALTH INSURANCE | Admitting: Psychiatry

## 2019-11-04 ENCOUNTER — Other Ambulatory Visit: Payer: Self-pay

## 2019-11-04 ENCOUNTER — Encounter (HOSPITAL_COMMUNITY): Payer: Self-pay | Admitting: Psychiatry

## 2019-11-04 DIAGNOSIS — F321 Major depressive disorder, single episode, moderate: Secondary | ICD-10-CM

## 2019-11-04 MED ORDER — ALPRAZOLAM 0.5 MG PO TABS: 0.5000 mg | ORAL_TABLET | Freq: Three times a day (TID) | ORAL | 2 refills | Status: DC | PRN

## 2019-11-04 MED ORDER — SERTRALINE HCL 100 MG PO TABS: 150.0000 mg | ORAL_TABLET | Freq: Every day | ORAL | 2 refills | Status: DC

## 2019-11-04 MED ORDER — TRAZODONE HCL 50 MG PO TABS: 50.0000 mg | ORAL_TABLET | Freq: Every day | ORAL | 2 refills | Status: DC

## 2019-11-04 NOTE — Progress Notes (Signed)
Virtual Visit via Video Note  I connected with Jared Griffin on 11/04/19 at 11:00 AM EST by a video enabled telemedicine application and verified that I am speaking with the correct person using two identifiers.   I discussed the limitations of evaluation and management by telemedicine and the availability of in person appointments. The patient expressed understanding and agreed to proceed.     I discussed the assessment and treatment plan with the patient. The patient was provided an opportunity to ask questions and all were answered. The patient agreed with the plan and demonstrated an understanding of the instructions.   The patient was advised to call back or seek an in-person evaluation if the symptoms worsen or if the condition fails to improve as anticipated.  I provided 15 minutes of non-face-to-face time during this encounter.   [name], MD  [facility] MD/PA/NP OP Progress Note  11/04/2019 11:13 AM Jared Griffin  MRN:  [id number]  Chief Complaint:  Chief Complaint    Depression; Anxiety; Follow-up     HPI:  This patient is a 59 year old married white male lives with his wife and 21 year old son in [place]. 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 zone but had an arm injury at work and had to quit there as well.  The patient was referred by his primary physician, Dr. [name], for further assessment and treatment of anxiety and depression.  The patient states that he is become increasingly depressed and anxious over the last couple of years due to work stress. He stated that he was always under pressure to get more things done than he had time to do. 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 He got to the point of hating going to work. Several years ago he had  cardiac problems and had a stent placed. In January of this year he began developing dizzy spells chest pain headaches stomach aches. It got so bad that he was readmitted for cardiac catheterization but nothing new was found.  The patient was having difficulty sleeping constant worry and anxiety about his job sadness and anger. He was getting irritable with people at home. After talking to his primary care physician at length and decided to take him out on medical retirement. The patient has been on Wellbutrin SR 150 no gram twice a day for several weeks now. He had also been out in the past when his wife was sick. He has not seen any benefit from this but does feel tremendously better since he stopped working. He is also on Xanax 0.5 mg 3 times a day which he thinks has helped to some degree.  Since stopping working he's had a big turnaround. His sleep and energy have improved. 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 He is much less anxious and is no longer having the chest pain dizzy spells or stomachaches. He denies suicidal ideation or psychotic symptoms. He does not use drugs or alcohol  The patient returns after 3 months.  He has had some significant health issues.  He was positive for coronavirus at the end of December.  He did get antibody infusion.  Fortunately did not have to be hospitalized and had a fairly mild course but developed a secondary sinus infection.  He still feels tired from all this.  He also is experiencing a lot of back pain and is going to get an epidural injection as well as continued arm pain.  Overall however he is functioning pretty well and the medication are helping for sleep depression and anxiety.  He denies any thoughts of self-harm or suicide.  He is trying to do what he can around the house but he is limited by his arm and back problems Visit Diagnosis:    ICD-10-CM   1. Moderate single current episode of major depressive disorder ([place])  F32.1      Past Psychiatric History: none  Past Medical History:  Past Medical History:  Diagnosis Date  . Anginal pain ([place])   . Anxiety   . 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 COPD (chronic obstructive pulmonary disease) ([place])    very close to being diagnosed  . Coronary artery disease   . Coronary atherosclerosis of native coronary artery    a. 12/09/2013: s/p PCI with 3.0 x 28 Promus DES to mLAD  . Cyst (solitary) of breast 10/03/2017   removed from back  . Depression   . Dyspnea    take inhaler  . Elbow injury 07/25/2017   surgery for torn tendon  . Essential hypertension, benign   . Fatty liver disease, nonalcoholic   . Lateral epicondylitis of right elbow   . Mixed hyperlipidemia 2006  . Obesity   . Osgood-Schlatter's disease of right knee   . Type 2 diabetes mellitus ([place])     Past Surgical History:  Procedure Laterality Date  . BIOPSY  01/21/2019   Procedure: BIOPSY;  Surgeon: [name], MD;  [facility];  Service: Endoscopy;;  gastric bx and gastric polyps  . CARDIAC CATHETERIZATION  2011  . CARDIAC CATHETERIZATION N/A 09/20/2015   Procedure: Left Heart Cath and Coronary Angiography;  Surgeon: [name], MD;  Location: [place] [facility];  Service: Cardiovascular;  Laterality: N/A;  . CHOLECYSTECTOMY  2003  . COLONOSCOPY  06/19/2012   Procedure: COLONOSCOPY;  Surgeon: [name], MD;  [facility];  Service: Endoscopy;  Laterality: N/A;  930  . COLONOSCOPY N/A 10/17/2017   Procedure: COLONOSCOPY;  Surgeon: [name], MD;  [facility];  Service: Endoscopy;  Laterality: N/A;  1225  . CYST EXCISION  07/2019  . ESOPHAGOGASTRODUODENOSCOPY (EGD) WITH PROPOFOL N/A 01/21/2019   Procedure: ESOPHAGOGASTRODUODENOSCOPY (EGD) WITH PROPOFOL;  Surgeon: [name], MD;  [facility];  Service: Endoscopy;  Laterality: N/A;  10:15am  . KNEE ARTHROPLASTY Right 1999  . LEFT HEART CATH AND  CORONARY ANGIOGRAPHY N/A 02/26/2018   Procedure: LEFT HEART CATH AND CORONARY ANGIOGRAPHY;  Surgeon: [name], MD;  Location: [place] [facility];  Service: Cardiovascular;  Laterality: N/A;  . LEFT HEART CATHETERIZATION WITH CORONARY ANGIOGRAM N/A 12/09/2013   Procedure: LEFT HEART CATHETERIZATION WITH CORONARY ANGIOGRAM;  Surgeon: [name], MD;  [facility];  Service: Cardiovascular;  Laterality: N/A;  . Liver biopsy    . POLYPECTOMY  10/17/2017   Procedure: POLYPECTOMY;  Surgeon: [name], MD;  [facility];  Service: Endoscopy;;  ascending colon, splenic flexure,sigmoid x2  . TENNIS ELBOW RELEASE/NIRSCHEL PROCEDURE Right 07/25/2017   Procedure: TENNIS ELBOW RELEASE and debriedment;  Surgeon: [name], MD;  Location: [facility] OR[facility];  Service: Orthopedics;  Laterality: Right;  . TONSILLECTOMY  1970's  Family Psychiatric History: see below  Family History:  Family History  Problem Relation Age of Onset  . Cancer Maternal Aunt   . CVA Maternal Grandmother   . CVA Maternal Grandfather     Social History:  Social History   Socioeconomic History  . Marital status: Married    Spouse name: Not on file  . Number of children: Not on file  . Years of education: Not on file  . Highest education level: Not on file  Occupational History  . Not on file  Tobacco Use  . Smoking status: Former Smoker    Packs/day: 0.50    Years: 10.00    Pack years: 5.00    Types: Cigarettes    Start date: 02/02/1973    Quit date: 09/17/1983    Years since quitting: 36.1  . Smokeless tobacco: Former Systems developer    Types: R[place]al date: 09/17/1983  Substance and Sexual Activity  . Alcohol use: No    Alcohol/week: 0.0 standard drinks  . Drug use: No  . Sexual activity: Yes  Other Topics Concern  . 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    Married with 1 child    Right handed    12 th    Very little caffeine   Social Determinants of Health   Financial Resource Strain: Low Risk   . Difficulty of Paying Living Expenses: Not hard at all  Food Insecurity: No Food Insecurity  . Worried About Charity fundraiser in the Last Year: Never true  . Ran Out of Food in the Last Year: Never true  Transportation Needs: No Transportation Needs  . Lack of Transportation (Medical): No  . Lack of Transportation (Non-Medical): No  Physical Activity: Unknown  . Days of Exercise per Week: Patient refused  . Minutes of Exercise per Session: Patient refused  Stress: Stress Concern Present  . Feeling of Stress : To some extent  Social Connections: Unknown  . Frequency of Communication with Friends and Family: Patient refused  . Frequency of Social Gatherings with Friends and Family: Patient refused  . Attends Religious Services: Patient refused  . Active Member of Clubs or Organizations: Patient refused  . Attends Archivist Meetings: Patient refused  . Marital Status: Patient refused    Allergies:  Allergies  Allergen Reactions  . Metoprolol Rash    Metabolic Disorder Labs: Lab Results  Component Value Date   HGBA1C 9.1 (H) 08/20/2019   MPG 211.6 07/12/2018   MPG 197.25 07/23/2017   No results found for: PROLACTIN Lab Results  Component Value Date   CHOL 116 10/18/2018   TRIG 173 (H) 10/18/2018   HDL 30 (L) 10/18/2018   CHOLHDL 3.9 10/18/2018   VLDL UNABLE TO CALCULATE IF TRIGLYCERIDE OVER 400 mg/dL 07/12/2018   LDLCALC 51 10/18/2018   LDLCALC UNABLE TO CALCULATE IF TRIGLYCERIDE OVER 400 mg/dL 07/12/2018   Lab Results  Component Value Date   TSH 0.648 08/20/2019   TSH 0.65 08/20/2019    Therapeutic Level Labs: No results found for: LITHIUM No results found for: VALPROATE No components found for:  CBMZ  Current Medications: Current Outpatient Medications  Medication Sig Dispense Refill  . albuterol (VENTOLIN HFA) 108 (90  Base) MCG/ACT inhaler INHALE 2 PUFFS INTO LUNGS EVERY 6 HOURS AS NEEDED FOR WHEEZING AND SHORTNESS OF BREATH. 8.5 g 5  . ALPRAZolam (XANAX) 0.5  MG tablet Take 1 tablet (0.5 mg total) by mouth 3 (three) times daily as needed for sleep or anxiety. 90 tablet 2  . aspirin (ASPIR-LOW) 81 MG EC tablet Take 1 tablet (81 mg total) by mouth daily. 30 tablet 12  . atorvastatin (LIPITOR) 40 MG tablet TAKE (1) TABLET BY MOUTH ONCE DAILY. 90 tablet 1  . azithromycin (ZITHROMAX Z-PAK) 250 MG tablet Take 2 tablets (500 mg) on  Day 1,  followed by 1 tablet (250 mg) once daily on Days 2 through 5. 6 each 0  . B-D UF III MINI PEN NEEDLES 31G X 5 MM MISC USE WITH TRESIBA INSULIN DAILY AS DIRECTED. 100 each 5  . benzonatate (TESSALON) 100 MG capsule Take 1 capsule (100 mg total) by mouth every 8 (eight) hours. 21 capsule 0  . celecoxib (CELEBREX) 200 MG capsule Take 1 capsule (200 mg total) by mouth 2 (two) times daily. 14 capsule 0  . cetirizine (ZYRTEC) 10 MG tablet Take 1 tablet (10 mg total) by mouth daily. 30 tablet 0  . Cholecalciferol (VITAMIN D3) 125 MCG (5000 UT) CAPS Take 1 capsule (5,000 Units total) by mouth daily. 90 capsule 0  . Continuous Blood Gluc Sensor (FREESTYLE LIBRE 14 DAY SENSOR) MISC Inject 1 each into the skin every 14 (fourteen) days. Use as directed. 2 each 2  . diclofenac (CATAFLAM) 50 MG tablet Take 1 tablet (50 mg total) by mouth 2 (two) times daily. 90 tablet 3  . diltiazem (CARDIZEM) 30 MG tablet TAKE (1) TABLET BY MOUTH TWICE DAILY. 180 tablet 1  . fluticasone (FLONASE) 50 MCG/ACT nasal spray Place 2 sprays into both nostrils daily. 16 g 0  . fluticasone (FLOVENT HFA) 110 MCG/ACT inhaler Inhale 2 puffs into the lungs 2 (two) times a day. 1 Inhaler 5  . gabapentin (NEURONTIN) 300 MG capsule Take 1 capsule (300 mg total) by mouth 4 (four) times daily. 120 capsule 5  . glipiZIDE (GLUCOTROL XL) 5 MG 24 hr tablet TAKE (2) TABLETS BY MOUTH ONCE DAILY WITH BREAKFAST. 180 tablet 0  . glucose  blood (CONTOUR NEXT TEST) test strip 1 each by Other route 4 (four) times daily. Use as instructed 150 each 5  . HYDROcodone-acetaminophen (NORCO/VICODIN) 5-325 MG tablet Take 1 tablet by mouth every 6 (six) hours as needed for moderate pain. 30 tablet 0  . Icosapent Ethyl (VASCEPA) 1 g CAPS Take 2 capsules (2 g total) by mouth 2 (two) times a day. 120 capsule 11  . insulin aspart (NOVOLOG FLEXPEN) 100 UNIT/ML FlexPen Inject 15-21 Units into the skin 3 (three) times daily before meals. 10 pen 2  . Insulin Glargine, 2 Unit Dial, (TOUJEO MAX SOLOSTAR) 300 UNIT/ML SOPN Inject 130 Units into the skin at bedtime. 4 pen 2  . Lancets MISC 1 each by Does not apply route 4 (four) times daily. 150 each 5  . losartan (COZAAR) 25 MG tablet TAKE (1) TABLET BY MOUTH ONCE DAILY. 90 tablet 0  . meclizine (ANTIVERT) 25 MG tablet Take 1 tablet (25 mg total) by mouth 3 (three) times daily as needed for dizziness. 30 tablet 0  . metFORMIN (GLUCOPHAGE) 500 MG tablet TAKE 2 TABLETS BY MOUTH TWICE DAILY WITH A MEAL. 360 tablet 0  . nitroGLYCERIN (NITROSTAT) 0.4 MG SL tablet Place 1 tablet (0.4 mg total) under the tongue every 5 (five) minutes as needed for chest pain. 25 tablet 3  . pantoprazole (PROTONIX) 40 MG tablet Take one daily 90 tablet 1  . ranolazine (  RANEXA) 1000 MG SR tablet TAKE (1) TABLET BY MOUTH TWICE DAILY. 60 tablet 11  . sertraline (ZOLOFT) 100 MG tablet Take 1.5 tablets (150 mg total) by mouth daily. 135 tablet 2  . traZODone (DESYREL) 50 MG tablet Take 1 tablet (50 mg total) by mouth at bedtime. 30 tablet 2   No current facility-administered medications for this visit.     Musculoskeletal: Strength & Muscle Tone: within normal limits Gait & Station: normal Patient leans: N/A  Psychiatric Specialty Exam: Review of Systems  Constitutional: Positive for fatigue.  Musculoskeletal: Positive for arthralgias and back pain.  All other systems reviewed and are negative.   There were no vitals taken  for this visit.There is no height or weight on file to calculate BMI.  General Appearance: Casual and Fairly Groomed  Eye Contact:  Good  Speech:  Clear and Coherent  Volume:  Normal  Mood:  Euthymic  Affect:  Appropriate and Congruent  Thought Process:  Goal Directed  Orientation:  Full (Time, Place, and Person)  Thought Content: Rumination   Suicidal Thoughts:  No  Homicidal Thoughts:  No  Memory:  Immediate;   Good Recent;   Good Remote;   Fair  Judgement:  Good  Insight:  Good  Psychomotor Activity:  Decreased  Concentration:  Concentration: Good and Attention Span: Good  Recall:  Good  Fund of Knowledge: Good  Language: Good  Akathisia:  No  Handed:  Right  AIMS (if indicated): not done  Assets:  Communication Skills Desire for Improvement Resilience Social Support Talents/Skills  ADL's:  Intact  Cognition: WNL  Sleep:  Good   Screenings: GAD-7     Office Visit from 08/05/2018 in [place]  Total GAD-7 Score  5    PHQ2-9     Nutrition from 08/06/2018 in [facility] Office Visit from 08/05/2018 in [place] Office Visit from 07/24/2018 in [facility] Office Visit from 12/02/2017 in [place] Office Visit from 08/02/2017 in [place]  PHQ-2 Total Score  0  1  0  0  0  PHQ-9 Total Score  --  3  --  1  --       Assessment and Plan: This patient is a 59 year old male with a history of significant depression and anxiety.  He continues to do well on his current regimen despite his recent health issues.  He will continue Zoloft 150 mg daily for depression, trazodone 50 mg at bedtime as needed for sleep and Xanax 0.5 mg 3 times daily as needed for anxiety.  He will return to see me in 3 months   [name], MD 11/04/2019, 11:13 AM

## 2019-11-17 ENCOUNTER — Other Ambulatory Visit: Payer: Self-pay

## 2019-11-17 ENCOUNTER — Ambulatory Visit (INDEPENDENT_AMBULATORY_CARE_PROVIDER_SITE_OTHER): Payer: PRIVATE HEALTH INSURANCE | Admitting: Physical Medicine and Rehabilitation

## 2019-11-17 ENCOUNTER — Ambulatory Visit: Payer: Self-pay

## 2019-11-17 ENCOUNTER — Encounter: Payer: Self-pay | Admitting: Physical Medicine and Rehabilitation

## 2019-11-17 VITALS — BP 147/91 | HR 108

## 2019-11-17 DIAGNOSIS — M47816 Spondylosis without myelopathy or radiculopathy, lumbar region: Secondary | ICD-10-CM

## 2019-11-17 MED ORDER — METHYLPREDNISOLONE ACETATE 80 MG/ML IJ SUSP
40.0000 mg | Freq: Once | INTRAMUSCULAR | Status: AC
  Administered 2019-11-17: 40 mg

## 2019-11-17 NOTE — Progress Notes (Signed)
 .  Numeric Pain Rating Scale and Functional Assessment Average Pain 8   In the last MONTH (on 0-10 scale) has pain interfered with the following?  1. General activity like being  able to carry out your everyday physical activities such as walking, climbing stairs, carrying groceries, or moving a chair?  Rating(8)   +Driver, -BT, -Dye Allergies.  

## 2019-12-01 ENCOUNTER — Other Ambulatory Visit: Payer: Self-pay | Admitting: Family Medicine

## 2019-12-09 ENCOUNTER — Other Ambulatory Visit: Payer: Self-pay

## 2019-12-09 ENCOUNTER — Ambulatory Visit (HOSPITAL_COMMUNITY)
Admission: RE | Admit: 2019-12-09 | Discharge: 2019-12-09 | Disposition: A | Discharge status: Home / Self Care | Payer: PRIVATE HEALTH INSURANCE | Source: Ambulatory Visit | Attending: Family Medicine | Admitting: Family Medicine

## 2019-12-09 ENCOUNTER — Ambulatory Visit (INDEPENDENT_AMBULATORY_CARE_PROVIDER_SITE_OTHER): Payer: PRIVATE HEALTH INSURANCE | Admitting: Family Medicine

## 2019-12-09 VITALS — BP 126/88 | Temp 97.9°F | Wt 268.0 lb

## 2019-12-09 DIAGNOSIS — I10 Essential (primary) hypertension: Secondary | ICD-10-CM

## 2019-12-09 DIAGNOSIS — E1140 Type 2 diabetes mellitus with diabetic neuropathy, unspecified: Secondary | ICD-10-CM | POA: Diagnosis not present

## 2019-12-09 DIAGNOSIS — E1159 Type 2 diabetes mellitus with other circulatory complications: Secondary | ICD-10-CM

## 2019-12-09 DIAGNOSIS — J449 Chronic obstructive pulmonary disease, unspecified: Secondary | ICD-10-CM | POA: Insufficient documentation

## 2019-12-09 MED ORDER — DILTIAZEM HCL 30 MG PO TABS: ORAL_TABLET | ORAL | 1 refills | Status: DC

## 2019-12-09 MED ORDER — GABAPENTIN 300 MG PO CAPS: 300.0000 mg | ORAL_CAPSULE | Freq: Four times a day (QID) | ORAL | 5 refills | Status: DC

## 2019-12-09 MED ORDER — ADVAIR HFA 115-21 MCG/ACT IN AERO: 2.0000 | INHALATION_SPRAY | Freq: Two times a day (BID) | RESPIRATORY_TRACT | 5 refills | Status: DC

## 2019-12-09 NOTE — Progress Notes (Signed)
   Subjective:    Patient ID: Jared Griffin, male    DOB: [birth year], 59 y.o.   MRN: [id number]  COPD This is a chronic problem. The current episode started more than 1 year ago. His past medical history is significant for COPD.   Patient seen today for COPD check up and medication refill.  Gabapentin-still compliant with the meds. Definitely helps  The neuropathic pain of his diabetes.  Breathing not helping at all, states worse since the covid hit   Feels like smothering a lot  Occurs at night,  No sleep sincr yesterday   breahing not good   Pt not  Doing any type of exercise these days  Got an epidural in the past few weeks sec to spinal pain   Pt s breathing was overall better with priorto the covid infection      Review of Systems No headache, no major weight loss or weight gain, no chest pain no back pain abdominal pain no change in bowel habits complete ROS otherwise negative     Objective:   Physical Exam   Alert and oriented, vitals reviewed and stable, NAD ENT-TM's and ext canals WNL bilat via otoscopic exam Soft palate, tonsils and post pharynx WNL via oropharyngeal exam Neck-symmetric, no masses; thyroid nonpalpable and nontender Pulmonary-no tachypnea or accessory muscle use; Clear without wheezes via auscultation Card--no abnrml murmurs, rhythm reg and rate WNL Carotid pulses symmetric, without bruits      Assessment & Plan:  Impression 1 progressive dyspnea.  Worsened since COVID-19.  Element of COPD.  Also not exercising on a regular basis at this time.  Discussed.  We will do a baseline chest x-ray.  Also will switch from steroid inhaler to steroid chronic bronchodilator combination medication, if substantial improvement will maintain if not will refer to pulmonary.  Options discussed  2.  Type 2 diabetes.  Followed by endocrinologist.  3.  Mental health issues followed by specialist  4.  Hypertension.  Currently managed in part by  cardiologist.  We actually maintain one of the medications.  Will maintain  5.  Neuropathic pain.  Feet.  Chronic.  Will maintain Neurontin.  Prescribed today.  Initiate Advair/chest x-ray/medications refilled/follow-up every 6 months.  Diet exercise discussed and encouraged

## 2019-12-14 ENCOUNTER — Other Ambulatory Visit: Payer: Self-pay | Admitting: Cardiovascular Disease

## 2019-12-14 ENCOUNTER — Other Ambulatory Visit: Payer: Self-pay | Admitting: "Endocrinology

## 2019-12-17 NOTE — Progress Notes (Signed)
Jared Griffin - 59 y.o. male MRN [id number]  Date of birth: [birth year]  Office Visit Note: Visit Date: 11/17/2019 PCP: Mikey Kirschner, MD Referred by: Mikey Kirschner, MD  Subjective: Chief Complaint  Patient presents with  . Lower Back - Pain  . Right Hip - Pain   HPI:  Jared Griffin is a 59 y.o. male who comes in today For planned right L3-4 facet joint block.  Patient followed by Dr. Arther Abbott.  He has failed conservative care otherwise.  Having low back and right hip pain no groin pain.  MRI is interesting that he has an S1 transitional segment but then the radiologist actually numbered L2-3 twice on the report.  Clearly looking at the films themselves of the L3-4 areas the one with arthritis.  ROS Otherwise per HPI.  Assessment & Plan: Visit Diagnoses:  1. Spondylosis without myelopathy or radiculopathy, lumbar region     Plan: No additional findings.   Meds & Orders:  Meds ordered this encounter  Medications  . methylPREDNISolone acetate (DEPO-MEDROL) injection 40 mg    Orders Placed This Encounter  Procedures  . Facet Injection  . XR C-ARM NO REPORT    Follow-up: Return for visit to requesting physician as needed.   Procedures: No procedures performed  Lumbar Facet Joint Intra-Articular Injection(s) with Fluoroscopic Guidance  Patient: Jared Griffin      Date of Birth: 06-09-1961 MRN: [id number] PCP: Mikey Kirschner, MD      Visit Date: 11/17/2019   Universal Protocol:    Date/Time: 11/17/2019  Consent Given By: the patient  Position: PRONE   Additional Comments: Vital signs were monitored before and after the procedure. Patient was prepped and draped in the usual sterile fashion. The correct patient, procedure, and site was verified.   Injection Procedure Details:  Procedure Site One Meds Administered:  Meds ordered this encounter  Medications  . methylPREDNISolone acetate (DEPO-MEDROL) injection 40 mg      Laterality: Right  Location/Site: S1 transitional segment L3-L4  Needle size: 22 guage  Needle type: Spinal  Needle Placement: Articular  Findings:  -Comments: Excellent flow of contrast producing a partial arthrogram.  Procedure Details: The fluoroscope beam is vertically oriented in AP, and the inferior recess is visualized beneath the lower pole of the inferior apophyseal process, which represents the target point for needle insertion. When direct visualization is difficult the target point is located at the medial projection of the vertebral pedicle. The region overlying each aforementioned target is locally anesthetized with a 1 to 2 ml. volume of 1% Lidocaine without Epinephrine.   The spinal needle was inserted into each of the above mentioned facet joints using biplanar fluoroscopic guidance. A 0.25 to 0.5 ml. volume of Isovue-250 was injected and a partial facet joint arthrogram was obtained. A single spot film was obtained of the resulting arthrogram.    One to 1.25 ml of the steroid/anesthetic solution was then injected into each of the facet joints noted above.   Additional Comments:  The patient tolerated the procedure well Dressing: 2 x 2 sterile gauze and Band-Aid    Post-procedure details: Patient was observed during the procedure. Post-procedure instructions were reviewed.  Patient left the clinic in stable condition.     Clinical History: MRI LUMBAR SPINE WITHOUT CONTRAST  TECHNIQUE: Multiplanar, multisequence MR imaging of the lumbar spine was performed. No intravenous contrast was administered.  COMPARISON:  Plain films lumbar spine 11/10/2018.  FINDINGS: Segmentation: The patient has  transitional lumbosacral anatomy. Numbering scheme on this study and is based on the lowest visible pair of ribs on the comparison plain films. Based on this numbering scheme, there is lumbarization of the first sacral segment on the right and a fully formed disc  at the S1-2 level.  Alignment:  Maintained.  Vertebrae: No fracture, evidence of discitis or worrisome lesion. Hemangioma in L4 is noted.  Conus medullaris and cauda equina: Conus extends to the L2 level. Conus and cauda equina appear normal.  Paraspinal and other soft tissues: Negative.  Disc levels:  L1-2: Mild facet arthropathy.  Otherwise negative.  L2-3: Mild facet degenerative change.  Otherwise negative.  L2-3: Shallow disc bulge, ligamentum flavum thickening and mild to moderate facet degenerative disease. No stenosis.  L3-4: Minimal disc bulge.  No stenosis.  L5-S1: Minimal disc bulge.  No stenosis.  S1-2: Transitional segment.  Negative.  IMPRESSION: Transitional lumbosacral anatomy. Please see numbering scheme above and correlate with plain films if any intervention is planned.  Mild lumbar degenerative change without central canal or foraminal narrowing. Scattered facet arthropathy is most notable at L3-4.   Electronically Signed   By: Inge Rise M.D.   On: 10/10/2019 10:56     Objective:  VS:  HT:    WT:   BMI:     BP:(!) 147/91  HR:(!) 108bpm  TEMP: ( )  RESP:  Physical Exam  Ortho Exam Imaging: No results found.

## 2019-12-17 NOTE — Procedures (Signed)
Lumbar Facet Joint Intra-Articular Injection(s) with Fluoroscopic Guidance  Patient: Jared Griffin      Date of Birth: July 21, 1961 MRN: [id number] PCP: Mikey Kirschner, MD      Visit Date: 11/17/2019   Universal Protocol:    Date/Time: 11/17/2019  Consent Given By: the patient  Position: PRONE   Additional Comments: Vital signs were monitored before and after the procedure. Patient was prepped and draped in the usual sterile fashion. The correct patient, procedure, and site was verified.   Injection Procedure Details:  Procedure Site One Meds Administered:  Meds ordered this encounter  Medications  . methylPREDNISolone acetate (DEPO-MEDROL) injection 40 mg     Laterality: Right  Location/Site: S1 transitional segment L3-L4  Needle size: 22 guage  Needle type: Spinal  Needle Placement: Articular  Findings:  -Comments: Excellent flow of contrast producing a partial arthrogram.  Procedure Details: The fluoroscope beam is vertically oriented in AP, and the inferior recess is visualized beneath the lower pole of the inferior apophyseal process, which represents the target point for needle insertion. When direct visualization is difficult the target point is located at the medial projection of the vertebral pedicle. The region overlying each aforementioned target is locally anesthetized with a 1 to 2 ml. volume of 1% Lidocaine without Epinephrine.   The spinal needle was inserted into each of the above mentioned facet joints using biplanar fluoroscopic guidance. A 0.25 to 0.5 ml. volume of Isovue-250 was injected and a partial facet joint arthrogram was obtained. A single spot film was obtained of the resulting arthrogram.    One to 1.25 ml of the steroid/anesthetic solution was then injected into each of the facet joints noted above.   Additional Comments:  The patient tolerated the procedure well Dressing: 2 x 2 sterile gauze and Band-Aid    Post-procedure  details: Patient was observed during the procedure. Post-procedure instructions were reviewed.  Patient left the clinic in stable condition.

## 2019-12-23 ENCOUNTER — Telehealth: Payer: Self-pay | Admitting: Physical Medicine and Rehabilitation

## 2019-12-23 NOTE — Telephone Encounter (Signed)
Patient is requesting another appointment. He had right L3-4 facet injection on 11/17/19. He reports 30% relief which lasted for about 1.5 weeks. Please advise.

## 2019-12-24 ENCOUNTER — Other Ambulatory Visit: Payer: Self-pay

## 2019-12-24 ENCOUNTER — Encounter: Payer: Self-pay | Admitting: "Endocrinology

## 2019-12-24 ENCOUNTER — Ambulatory Visit (INDEPENDENT_AMBULATORY_CARE_PROVIDER_SITE_OTHER): Payer: PRIVATE HEALTH INSURANCE | Admitting: "Endocrinology

## 2019-12-24 ENCOUNTER — Other Ambulatory Visit: Payer: Self-pay | Admitting: Cardiovascular Disease

## 2019-12-24 VITALS — BP 126/85 | HR 97 | Ht 74.5 in | Wt 269.6 lb

## 2019-12-24 DIAGNOSIS — I10 Essential (primary) hypertension: Secondary | ICD-10-CM

## 2019-12-24 DIAGNOSIS — E782 Mixed hyperlipidemia: Secondary | ICD-10-CM | POA: Diagnosis not present

## 2019-12-24 DIAGNOSIS — E1159 Type 2 diabetes mellitus with other circulatory complications: Secondary | ICD-10-CM | POA: Diagnosis not present

## 2019-12-24 LAB — POCT GLYCOSYLATED HEMOGLOBIN (HGB A1C): Hemoglobin A1C: 7.5 % — AB (ref 4.0–5.6)

## 2019-12-24 MED ORDER — FREESTYLE LIBRE 14 DAY SENSOR MISC: 1.0000 | 2 refills | Status: DC

## 2019-12-24 MED ORDER — FREESTYLE LIBRE 14 DAY READER DEVI: 1.0000 | Freq: Once | 0 refills | Status: AC

## 2019-12-24 NOTE — Patient Instructions (Signed)

## 2019-12-24 NOTE — Telephone Encounter (Signed)
L3-4 interlam or regroup PT/Chiro

## 2019-12-24 NOTE — Progress Notes (Signed)
12/24/2019, 9:37 AM    Endocrinology follow-up note   Subjective:    Patient ID: Jared Griffin, male    DOB: 10/04/1960.  Jared Griffin is being seen in follow-up  for management of currently uncontrolled symptomatic type 2 diabetes, hyperlipidemia, hypertension.   PMD:  Mikey Kirschner, MD.   Past Medical History:  Diagnosis Date  . Anginal pain ([place])   . Anxiety   . 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 COPD (chronic obstructive pulmonary disease) ([place])    very close to being diagnosed  . Coronary artery disease   . Coronary atherosclerosis of native coronary artery    a. 12/09/2013: s/p PCI with 3.0 x 28 Promus DES to mLAD  . Cyst (solitary) of breast 10/03/2017   removed from back  . Depression   . Dyspnea    take inhaler  . Elbow injury 07/25/2017   surgery for torn tendon  . Essential hypertension, benign   . Fatty liver disease, nonalcoholic   . Lateral epicondylitis of right elbow   . Mixed hyperlipidemia 2006  . Obesity   . Osgood-Schlatter's disease of right knee   . Type 2 diabetes mellitus ([place])    Past Surgical History:  Procedure Laterality Date  . BIOPSY  01/21/2019   Procedure: BIOPSY;  Surgeon: [name], MD;  [facility];  Service: Endoscopy;;  gastric bx and gastric polyps  . CARDIAC CATHETERIZATION  2011  . CARDIAC CATHETERIZATION N/A 09/20/2015   Procedure: Left Heart Cath and Coronary Angiography;  Surgeon: [name], MD;  Location: [facility] [facility];  Service: Cardiovascular;  Laterality: N/A;  . CHOLECYSTECTOMY  2003  . COLONOSCOPY  06/19/2012   Procedure: COLONOSCOPY;  Surgeon: [name], MD;  [facility];  Service: Endoscopy;  Laterality: N/A;  930  . COLONOSCOPY N/A 10/17/2017   Procedure: COLONOSCOPY;  Surgeon: [name], MD;  [facility];  Service: Endoscopy;  Laterality: N/A;  1225  . CYST EXCISION  07/2019  .  ESOPHAGOGASTRODUODENOSCOPY (EGD) WITH PROPOFOL N/A 01/21/2019   Procedure: ESOPHAGOGASTRODUODENOSCOPY (EGD) WITH PROPOFOL;  Surgeon: [name], MD;  [facility];  Service: Endoscopy;  Laterality: N/A;  10:15am  . KNEE ARTHROPLASTY Right 1999  . LEFT HEART CATH AND CORONARY ANGIOGRAPHY N/A 02/26/2018   Procedure: LEFT HEART CATH AND CORONARY ANGIOGRAPHY;  Surgeon: [name], MD;  Location: [facility];  Service: Cardiovascular;  Laterality: N/A;  . LEFT HEART CATHETERIZATION WITH CORONARY ANGIOGRAM N/A 12/09/2013   Procedure: LEFT HEART CATHETERIZATION WITH CORONARY ANGIOGRAM;  Surgeon: [name], MD;  Location: [facility] CATH LAB;  Service: Cardiovascular;  Laterality: N/A;  . Liver biopsy    . POLYPECTOMY  10/17/2017   Procedure: POLYPECTOMY;  Surgeon: [name], MD;  [facility];  Service: Endoscopy;;  ascending colon, splenic flexure,sigmoid x2  . TENNIS ELBOW RELEASE/NIRSCHEL PROCEDURE Right 07/25/2017   Procedure: TENNIS ELBOW RELEASE and debriedment;  Surgeon: [name], MD;  Location: [facility];  Service: Orthopedics;  Laterality: Right;  . TONSILLECTOMY  1970's   Social History   Socioeconomic History  . Marital status: Married    Spouse name: Not on file  . Number of children: Not on file  . Years of education: Not on file  . Highest education level: Not on file  Occupational History  . Not on file  Tobacco Use  . Smoking status: Former Smoker    Packs/day: 0.50    Years: 10.00    Pack years: 5.00    Types: Cigarettes    Start date: 02/02/1973    Quit date: 09/17/1983    Years since quitting: 36.2  . Smokeless tobacco: Former Systems developer    Types: [place] date: 09/17/1983  Substance and Sexual Activity  . Alcohol use: No    Alcohol/week: 0.0 standard drinks  . Drug use: No  . Sexual activity: Yes  Other Topics Concern  . 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    Married with 1 child   Right handed    12 th    Very little caffeine   Social Determinants of Health   Financial Resource Strain: Low Risk   . Difficulty of Paying Living Expenses: Not hard at all  Food Insecurity: No Food Insecurity  . Worried About Charity fundraiser in the Last Year: Never true  . Ran Out of Food in the Last Year: Never true  Transportation Needs: No Transportation Needs  . Lack of Transportation (Medical): No  . Lack of Transportation (Non-Medical): No  Physical Activity: Unknown  . Days of Exercise per Week: Patient refused  . Minutes of Exercise per Session: Patient refused  Stress: Stress Concern Present  . Feeling of Stress : To some extent  Social Connections: Unknown  . Frequency of Communication with Friends and Family: Patient refused  . Frequency of Social Gatherings with Friends and Family: Patient refused  . Attends Religious Services: Patient refused  . Active Member of Clubs or Organizations: Patient refused  . Attends Archivist Meetings: Patient refused  . Marital Status: Patient refused   Outpatient Encounter Medications as of 12/24/2019  Medication Sig  . albuterol (VENTOLIN HFA) 108 (90 Base) MCG/ACT inhaler INHALE 2 PUFFS INTO LUNGS EVERY 6 HOURS AS NEEDED FOR WHEEZING AND SHORTNESS OF BREATH.  Marland Kitchen ALPRAZolam (XANAX) 0.5 MG tablet Take 1 tablet (0.5 mg total) by mouth 3 (three) times daily as needed for sleep or anxiety.  Marland Kitchen aspirin (ASPIR-LOW) 81 MG EC tablet Take 1 tablet (81 mg total) by mouth daily.  Marland Kitchen atorvastatin (LIPITOR) 40 MG tablet TAKE (1) TABLET BY MOUTH ONCE DAILY.  Marland Kitchen B-D UF III MINI PEN NEEDLES 31G X 5 MM MISC USE WITH TRESIBA INSULIN DAILY AS DIRECTED.  Marland Kitchen benzonatate (TESSALON) 100 MG capsule Take 1 capsule (100 mg total) by mouth every 8 (eight) hours. (Patient taking differently: Take 100 mg by mouth every 8 (eight) hours. Takes as needed)  . celecoxib (CELEBREX) 200 MG  capsule Take 1 capsule (200 mg total) by mouth 2 (two) times daily.  . cetirizine (ZYRTEC) 10 MG tablet Take 1 tablet (10 mg total) by mouth daily.  . Cholecalciferol (VITAMIN D3) 125 MCG (5000 UT) CAPS Take 1 capsule (5,000 Units total) by mouth daily.  . Continuous Blood Gluc Receiver (FREESTYLE LIBRE 14 DAY READER) DEVI 1 each by Does not apply route once for 1 dose.  . Continuous Blood Gluc Sensor (FREESTYLE LIBRE 14 DAY SENSOR) MISC Inject 1 each into the skin every 14 (fourteen) days. Use as directed.  . diclofenac (CATAFLAM) 50 MG tablet Take 1 tablet (50 mg total) by mouth 2 (two) times daily.  Marland Kitchen diltiazem (CARDIZEM) 30 MG tablet TAKE (1) TABLET BY MOUTH TWICE DAILY.  Marland Kitchen  fluticasone (FLONASE) 50 MCG/ACT nasal spray Place 2 sprays into both nostrils daily.  . fluticasone-salmeterol (ADVAIR HFA) 115-21 MCG/ACT inhaler Inhale 2 puffs into the lungs 2 (two) times daily.  Marland Kitchen gabapentin (NEURONTIN) 300 MG capsule Take 1 capsule (300 mg total) by mouth 4 (four) times daily.  Marland Kitchen glipiZIDE (GLUCOTROL XL) 5 MG 24 hr tablet TAKE (2) TABLETS BY MOUTH ONCE DAILY WITH BREAKFAST.  Marland Kitchen glucose blood (CONTOUR NEXT TEST) test strip 1 each by Other route 4 (four) times daily. Use as instructed  . HYDROcodone-acetaminophen (NORCO/VICODIN) 5-325 MG tablet Take 1 tablet by mouth every 6 (six) hours as needed for moderate pain.  Vanessa Kick Ethyl (VASCEPA) 1 g CAPS Take 2 capsules (2 g total) by mouth 2 (two) times a day.  . insulin aspart (NOVOLOG FLEXPEN) 100 UNIT/ML FlexPen Inject 15-21 Units into the skin 3 (three) times daily before meals.  . Lancets MISC 1 each by Does not apply route 4 (four) times daily.  Marland Kitchen losartan (COZAAR) 25 MG tablet TAKE (1) TABLET BY MOUTH ONCE DAILY.  . meclizine (ANTIVERT) 25 MG tablet Take 1 tablet (25 mg total) by mouth 3 (three) times daily as needed for dizziness.  . metFORMIN (GLUCOPHAGE) 500 MG tablet TAKE 2 TABLETS BY MOUTH TWICE DAILY WITH A MEAL.  . nitroGLYCERIN (NITROSTAT)  0.4 MG SL tablet Place 1 tablet (0.4 mg total) under the tongue every 5 (five) minutes as needed for chest pain.  . pantoprazole (PROTONIX) 40 MG tablet TAKE (1) TABLET BY MOUTH ONCE DAILY.  . ranolazine (RANEXA) 1000 MG SR tablet TAKE (1) TABLET BY MOUTH TWICE DAILY.  Marland Kitchen sertraline (ZOLOFT) 100 MG tablet Take 1.5 tablets (150 mg total) by mouth daily.  . TOUJEO MAX SOLOSTAR 300 UNIT/ML Solostar Pen INJECT 130 UNITS INTO THE SKIN AT BEDTIME.  . traZODone (DESYREL) 50 MG tablet Take 1 tablet (50 mg total) by mouth at bedtime.  . [DISCONTINUED] Continuous Blood Gluc Sensor (FREESTYLE LIBRE 14 DAY SENSOR) MISC Inject 1 each into the skin every 14 (fourteen) days. Use as directed.   No facility-administered encounter medications on file as of 12/24/2019.    ALLERGIES: Allergies  Allergen Reactions  . Metoprolol Rash    VACCINATION STATUS: Immunization History  Administered Date(s) Administered  . Influenza,inj,Quad PF,6+ Mos 07/11/2016, 08/02/2017, 08/05/2018, 08/10/2019  . Influenza-Unspecified 06/18/2013, 06/17/2014, 07/06/2015  . Td 01/28/2014    Diabetes He presents for his follow-up diabetic visit. He has type 2 diabetes mellitus. Onset time: He was diagnosed at approximate age of 64 years. His disease course has been improving. There are no hypoglycemic associated symptoms. Pertinent negatives for hypoglycemia include no confusion, headaches, pallor or seizures. Pertinent negatives for diabetes include no chest pain, no fatigue, no polydipsia, no polyphagia, no polyuria and no weakness. There are no hypoglycemic complications. Symptoms are improving. Diabetic complications include heart disease. Risk factors for coronary artery disease include diabetes mellitus, dyslipidemia, hypertension, male sex, obesity, sedentary lifestyle and tobacco exposure. Current diabetic treatment includes oral agent (dual therapy) (He is currently on Invokana 300 mg p.o. daily, metformin 500 mg p.o. twice daily,  glipizide 5 mg p.o. twice daily.). His weight is increasing steadily. He is following a generally unhealthy diet. When asked about meal planning, he reported none. He has not had a previous visit with a dietitian. He rarely participates in exercise. His home blood glucose trend is decreasing steadily. His breakfast blood glucose range is generally 140-180 mg/dl. His lunch blood glucose range is generally 140-180 mg/dl.  His dinner blood glucose range is generally 140-180 mg/dl. His bedtime blood glucose range is generally 140-180 mg/dl. His overall blood glucose range is 140-180 mg/dl. (He presents with controlled glycemic profile to near target levels both fasting and postprandial.  His point-of-care A1c is 7.5% improving from 9.1% during his last visit.  He did not document or report hypoglycemia.) An ACE inhibitor/angiotensin II receptor blocker is being taken. Eye exam is current.  Hyperlipidemia This is a chronic problem. The problem is uncontrolled (He has severe hypertriglyceridemia at 1157.). Exacerbating diseases include diabetes and obesity. Pertinent negatives include no chest pain, myalgias or shortness of breath. Risk factors for coronary artery disease include diabetes mellitus, dyslipidemia, hypertension, male sex, obesity and a sedentary lifestyle.  Hypertension This is a chronic problem. The current episode started more than 1 year ago. The problem is controlled. Pertinent negatives include no chest pain, headaches, neck pain, palpitations or shortness of breath. Risk factors for coronary artery disease include diabetes mellitus, dyslipidemia, male gender, obesity, sedentary lifestyle and smoking/tobacco exposure. Hypertensive end-organ damage includes CAD/MI.    Review of systems  Constitutional: + gained weight,  current  Body mass index is 34.15 kg/m. , no fatigue, no subjective hyperthermia, no subjective hypothermia Eyes: no blurry vision, no xerophthalmia ENT: no sore throat, no  nodules palpated in throat, no dysphagia/odynophagia, no hoarseness Cardiovascular: no Chest Pain, no Shortness of Breath, no palpitations, no leg swelling Respiratory: no cough, no shortness of breath Gastrointestinal: no Nausea/Vomiting/Diarhhea Musculoskeletal: no muscle/joint aches Skin: no rashes, no hyperemia Neurological: no tremors, no numbness, no tingling, no dizziness Psychiatric: no depression, no anxiety    Objective:    BP 126/85   Pulse 97   Ht 6' 2.5" (1.892 m)   Wt 269 lb 9.6 oz (122.3 kg)   BMI 34.15 kg/m   Wt Readings from Last 3 Encounters:  12/24/19 269 lb 9.6 oz (122.3 kg)  12/09/19 268 lb (121.6 kg)  09/24/19 261 lb (118.4 kg)     Physical Exam- Limited  Constitutional:  Body mass index is 34.15 kg/m. , not in acute distress, normal state of mind Eyes:  EOMI, no exophthalmos Neck: Supple Thyroid: No gross goiter Respiratory: Adequate breathing efforts Musculoskeletal: no gross deformities, strength intact in all four extremities, no gross restriction of joint movements Skin:  no rashes, no hyperemia Neurological: no tremor with outstretched hands,    CMP     Component Value Date/Time   NA 139 08/20/2019 0937   K 4.1 08/20/2019 0937   CL 98 08/20/2019 0937   CO2 25 08/20/2019 0937   GLUCOSE 184 (H) 08/20/2019 0937   GLUCOSE 298 (H) 01/04/2019 1016   BUN 16 08/20/2019 0937   CREATININE 0.93 08/20/2019 0937   CREATININE 0.96 12/07/2013 0950   CALCIUM 9.6 08/20/2019 0937   PROT 7.0 08/20/2019 0937   ALBUMIN 4.6 08/20/2019 0937   AST 31 08/20/2019 0937   ALT 53 (H) 08/20/2019 0937   ALKPHOS 52 08/20/2019 0937   BILITOT 0.6 08/20/2019 0937   GFRNONAA 91 08/20/2019 0937   GFRAA 105 08/20/2019 0937    Diabetic Labs (most recent): Lab Results  Component Value Date   HGBA1C 7.5 (A) 12/24/2019   HGBA1C 9.1 (H) 08/20/2019   HGBA1C 9.1 08/20/2019     Lipid Panel ( most recent) Lipid Panel     Component Value Date/Time   CHOL 116  10/18/2018 0903   TRIG 173 (H) 10/18/2018 0903   HDL 30 (L) 10/18/2018 1856  CHOLHDL 3.9 10/18/2018 0903   CHOLHDL 6.4 07/12/2018 0713   VLDL UNABLE TO CALCULATE IF TRIGLYCERIDE OVER 400 mg/dL 07/12/2018 0713   LDLCALC 51 10/18/2018 0903      Lab Results  Component Value Date   TSH 0.648 08/20/2019   TSH 0.65 08/20/2019   TSH 0.525 07/12/2018   TSH 0.458 04/13/2017   TSH 0.493 02/05/2014   FREET4 1.35 08/20/2019      Assessment & Plan:   1. DM type 2 causing vascular disease ([place])  - Jared Griffin has currently uncontrolled symptomatic type 2 DM since 59 years of age.  He presents with controlled glycemic profile to near target levels both fasting and postprandial.  His point-of-care A1c is 7.5% improving from 9.1% during his last visit.  He did not document or report hypoglycemia.  -He  Was recently initiated on basal/bolus insulin. -his diabetes is complicated by coronary artery disease, obesity/sedentary life, history of smoking and he remains at a high risk for more acute and chronic complications which include CAD, CVA, CKD, retinopathy, and neuropathy. These are all discussed in detail with him.  - I have counseled him on diet management and weight loss, by adopting a carbohydrate restricted/protein rich diet.  - he  admits there is a room for improvement in his diet and drink choices. -  Suggestion is made for him to avoid simple carbohydrates  from his diet including Cakes, Sweet Desserts / Pastries, Ice Cream, Soda (diet and regular), Sweet Tea, Candies, Chips, Cookies, Sweet Pastries,  Store Bought Juices, Alcohol in Excess of  1-2 drinks a day, Artificial Sweeteners, Coffee Creamer, and "Sugar-free" Products. This will help patient to have stable blood glucose profile and potentially avoid unintended weight gain.   - I encouraged him to switch to  unprocessed or minimally processed complex starch and increased protein intake (animal or plant source), fruits,  and vegetables.  - he is advised to stick to a routine mealtimes to eat 3 meals  a day and avoid unnecessary snacks ( to snack only to correct hypoglycemia).   - he has been scheduled with Jearld Fenton, RDN, CDE for individualized diabetes education.  - I have approached him with the following individualized plan to manage diabetes and patient agrees:    -His presentation with near target glycemic profile other than 12 recently initiated intensive treatment with basal/bolus insulin has been controlled A1c to 7.5%. -He will continue to require such a regimen to maintain control of diabetes. -He is advised to continue Toujeo 130 units subcutaneously nightly, NovoLog  15 units 3 times daily AC for Premeal blood glucose readings above 90 mg per DL, approached to continue monitoring blood glucose 4 times a day-before meals and at bedtime .  - he is encouraged to call clinic for blood glucose levels less than 70 or above 200 mg /dl. -He is monitoring blood glucose 4 times a day-would benefit from a CGM device.  I discussed and prescribed the freestyle libre device for him. -He is advised to continue Metformin 1000 mg p.o. twice daily, glipizide 10 mg XL p.o. daily at breakfast.    -Due to severe hypertriglyceridemia,  he is not a suitable candidate for incretin therapy for now-insulin treatment would also help.   - Patient specific target  A1c;  LDL, HDL, Triglycerides, and  Waist Circumference were discussed in detail.  2) BP/HTN:  -His blood pressure is controlled to target.  he is advised to continue his current medications including losartan 25  mg p.o. daily with breakfast .  3) Lipids/HPL:   Review of his recent lipid panel showed unknown LDL due to severe hypertriglyceridemia of 1157+. -He is advised to continue atorvastatin 40 mg p.o. nightly,  he is also on Vascepa 2 g p.o. twice daily.  He is advised to be consistent on his medications.    4)  Weight/Diet: His BMI 38-VANVBTY  complicating the care of his diabetes.  He is a candidate for modest weight loss.   - I discussed with him the fact that loss of 5 - 10% of his  current body weight will have the most impact on his diabetes management.  CDE Consult will be initiated . Exercise, and detailed carbohydrates information provided  -  detailed on discharge instructions.  5) Chronic Care/Health Maintenance:  -he  is on ACEI/ARB and Statin medications and  is encouraged to initiate and continue to follow up with Ophthalmology, Dentist,  Podiatrist at least yearly or according to recommendations, and advised to   stay away from smoking. I have recommended yearly flu vaccine and pneumonia vaccine at least every 5 years; moderate intensity exercise for up to 150 minutes weekly; and  sleep for at least 7 hours a day.  - I advised patient to maintain close follow up with Mikey Kirschner, MD for primary care needs.  - Time spent on this patient care encounter:  35 min, of which > 50% was spent in  counseling and the rest reviewing his blood glucose logs , discussing his hypoglycemia and hyperglycemia episodes, reviewing his current and  previous labs / studies  ( including abstraction from other facilities) and medications  doses and developing a  long term treatment plan and documenting his care.   Please refer to Patient Instructions for Blood Glucose Monitoring and Insulin/Medications Dosing Guide"  in media tab for additional information. Please  also refer to " Patient Self Inventory" in the Media  tab for reviewed elements of pertinent patient history.  Jared Griffin participated in the discussions, expressed understanding, and voiced agreement with the above plans.  All questions were answered to his satisfaction. he is encouraged to contact clinic should he have any questions or concerns prior to his return visit.    Follow up plan: - Return in about 4 months (around 04/24/2020) for Bring Meter and Logs- A1c in  Office.  Glade Lloyd, MD [facility] [address] [place], [place] [address] Phone: [phone]  Fax: [phone]    12/24/2019, 9:37 AM  This note was partially dictated with voice recognition software. Similar sounding words can be transcribed inadequately or may not  be corrected upon review.

## 2019-12-25 NOTE — Telephone Encounter (Signed)
Scheduled for 4/29 at 0930 with driver and no blood thinners.

## 2020-01-14 ENCOUNTER — Encounter: Payer: PRIVATE HEALTH INSURANCE | Admitting: Physical Medicine and Rehabilitation

## 2020-01-19 ENCOUNTER — Telehealth: Payer: Self-pay | Admitting: Orthopedic Surgery

## 2020-01-19 NOTE — Telephone Encounter (Signed)
Patient called to request information about a functional test which Dr [name] "sent him for" and that worker's comp had approved. Relayed we will look up the information and call him back; aware a signed release is needed.

## 2020-01-20 NOTE — Telephone Encounter (Signed)
If it is not scanned in I am not sure I think he had it before I came to work here, so has been a while

## 2020-01-20 NOTE — Telephone Encounter (Signed)
Yes, as I understand it.

## 2020-01-20 NOTE — Telephone Encounter (Signed)
I have not located the requested information as of yet in patient's chart; please advise.

## 2020-01-20 NOTE — Telephone Encounter (Signed)
He had FCE a couple years ago would have been by physical therapy  Is that what he wants?

## 2020-01-21 NOTE — Telephone Encounter (Signed)
Patient recalled that the FCE was done at [facility] in [place] around March of 2019. I contacted their office; per [facility], verified, done 11/26/17. Their office has re-faxed it to Dr [name]. Upon re-reviewing chart, it is scanned in under Letter of functional capacity. Patient aware.

## 2020-01-21 NOTE — Telephone Encounter (Signed)
Thanks

## 2020-01-25 ENCOUNTER — Ambulatory Visit: Payer: Self-pay

## 2020-01-25 ENCOUNTER — Encounter: Payer: Self-pay | Admitting: Physical Medicine and Rehabilitation

## 2020-01-25 ENCOUNTER — Other Ambulatory Visit: Payer: Self-pay | Admitting: "Endocrinology

## 2020-01-25 ENCOUNTER — Ambulatory Visit (INDEPENDENT_AMBULATORY_CARE_PROVIDER_SITE_OTHER): Payer: PRIVATE HEALTH INSURANCE | Admitting: Physical Medicine and Rehabilitation

## 2020-01-25 ENCOUNTER — Other Ambulatory Visit: Payer: Self-pay

## 2020-01-25 VITALS — BP 149/90 | HR 100

## 2020-01-25 DIAGNOSIS — M48062 Spinal stenosis, lumbar region with neurogenic claudication: Secondary | ICD-10-CM

## 2020-01-25 MED ORDER — METHYLPREDNISOLONE ACETATE 80 MG/ML IJ SUSP
40.0000 mg | Freq: Once | INTRAMUSCULAR | Status: AC
  Administered 2020-01-25: 40 mg

## 2020-01-25 NOTE — Progress Notes (Signed)
Pt states pain across the lower back and into the buttocks and sometimes in both thighs. Pt states pain started November 2019. Pt states last injection did not help at all. Pt states most activities makes pain worse especially bending. Pt states pain medication helps with pain.   .Numeric Pain Rating Scale and Functional Assessment Average Pain 5   In the last MONTH (on 0-10 scale) has pain interfered with the following?  1. General activity like being  able to carry out your everyday physical activities such as walking, climbing stairs, carrying groceries, or moving a chair?  Rating(5)   +Driver, -BT, -Dye Allergies.

## 2020-01-25 NOTE — Procedures (Signed)
Lumbar Epidural Steroid Injection - Interlaminar Approach with Fluoroscopic Guidance  Patient: Jared Griffin      Date of Birth: Jul 15, 1961 MRN: [id number] PCP: Mikey Kirschner, MD      Visit Date: 01/25/2020   Universal Protocol:     Consent Given By: the patient  Position: PRONE  Additional Comments: Vital signs were monitored before and after the procedure. Patient was prepped and draped in the usual sterile fashion. The correct patient, procedure, and site was verified.   Injection Procedure Details:  Procedure Site One Meds Administered:  Meds ordered this encounter  Medications  . methylPREDNISolone acetate (DEPO-MEDROL) injection 40 mg     Laterality: Right  Location/Site:  L3-L4  Needle size: 20 G  Needle type: Tuohy  Needle Placement: Paramedian epidural  Findings:   -Comments: Excellent flow of contrast into the epidural space.  Procedure Details: Using a paramedian approach from the side mentioned above, the region overlying the inferior lamina was localized under fluoroscopic visualization and the soft tissues overlying this structure were infiltrated with 4 ml. of 1% Lidocaine without Epinephrine. The Tuohy needle was inserted into the epidural space using a paramedian approach.   The epidural space was localized using loss of resistance along with lateral and bi-planar fluoroscopic views.  After negative aspirate for air, blood, and CSF, a 2 ml. volume of Isovue-250 was injected into the epidural space and the flow of contrast was observed. Radiographs were obtained for documentation purposes.    The injectate was administered into the level noted above.   Additional Comments:  The patient tolerated the procedure well Dressing: 2 x 2 sterile gauze and Band-Aid    Post-procedure details: Patient was observed during the procedure. Post-procedure instructions were reviewed.  Patient left the clinic in stable condition.

## 2020-01-25 NOTE — Progress Notes (Signed)
Jared Griffin - 59 y.o. male MRN [id number]  Date of birth: 1960-11-17  Office Visit Note: Visit Date: 01/25/2020 PCP: Mikey Kirschner, MD Referred by: Mikey Kirschner, MD  Subjective: Chief Complaint  Patient presents with  . Lower Back - Pain  . Left Thigh - Pain  . Right Thigh - Pain   HPI:  Jared Griffin is a 59 y.o. male who comes in today for planned Right L3-L4 lumbar epidural steroid injection with fluoroscopic guidance.  The patient has failed conservative care including home exercise, medications, time and activity modification.  This injection will be diagnostic and hopefully therapeutic.  Please see requesting physician notes for further details and justification.  Prior facet joint block did not help at all. MRI reviewed with images and spine model.  MRI reviewed in the note below.    ROS Otherwise per HPI.  Assessment & Plan: Visit Diagnoses:  1. Spinal stenosis of lumbar region with neurogenic claudication     Plan: No additional findings.   Meds & Orders:  Meds ordered this encounter  Medications  . methylPREDNISolone acetate (DEPO-MEDROL) injection 40 mg    Orders Placed This Encounter  Procedures  . XR C-ARM NO REPORT  . Epidural Steroid injection    Follow-up: Return if symptoms worsen or fail to improve.   Procedures: No procedures performed  Lumbar Epidural Steroid Injection - Interlaminar Approach with Fluoroscopic Guidance  Patient: Jared Griffin      Date of Birth: 01-20-1961 MRN: [id number] PCP: Mikey Kirschner, MD      Visit Date: 01/25/2020   Universal Protocol:     Consent Given By: the patient  Position: PRONE  Additional Comments: Vital signs were monitored before and after the procedure. Patient was prepped and draped in the usual sterile fashion. The correct patient, procedure, and site was verified.   Injection Procedure Details:  Procedure Site One Meds Administered:  Meds ordered this  encounter  Medications  . methylPREDNISolone acetate (DEPO-MEDROL) injection 40 mg     Laterality: Right  Location/Site:  L3-L4  Needle size: 20 G  Needle type: Tuohy  Needle Placement: Paramedian epidural  Findings:   -Comments: Excellent flow of contrast into the epidural space.  Procedure Details: Using a paramedian approach from the side mentioned above, the region overlying the inferior lamina was localized under fluoroscopic visualization and the soft tissues overlying this structure were infiltrated with 4 ml. of 1% Lidocaine without Epinephrine. The Tuohy needle was inserted into the epidural space using a paramedian approach.   The epidural space was localized using loss of resistance along with lateral and bi-planar fluoroscopic views.  After negative aspirate for air, blood, and CSF, a 2 ml. volume of Isovue-250 was injected into the epidural space and the flow of contrast was observed. Radiographs were obtained for documentation purposes.    The injectate was administered into the level noted above.   Additional Comments:  The patient tolerated the procedure well Dressing: 2 x 2 sterile gauze and Band-Aid    Post-procedure details: Patient was observed during the procedure. Post-procedure instructions were reviewed.  Patient left the clinic in stable condition.     Clinical History: MRI LUMBAR SPINE WITHOUT CONTRAST  TECHNIQUE: Multiplanar, multisequence MR imaging of the lumbar spine was performed. No intravenous contrast was administered.  COMPARISON:  Plain films lumbar spine 11/10/2018.  FINDINGS: Segmentation: The patient has transitional lumbosacral anatomy. Numbering scheme on this study and is based on the lowest visible  pair of ribs on the comparison plain films. Based on this numbering scheme, there is lumbarization of the first sacral segment on the right and a fully formed disc at the S1-2 level.  Alignment:   Maintained.  Vertebrae: No fracture, evidence of discitis or worrisome lesion. Hemangioma in L4 is noted.  Conus medullaris and cauda equina: Conus extends to the L2 level. Conus and cauda equina appear normal.  Paraspinal and other soft tissues: Negative.  Disc levels:  L1-2: Mild facet arthropathy.  Otherwise negative.  L2-3: Mild facet degenerative change.  Otherwise negative.  L2-3: Shallow disc bulge, ligamentum flavum thickening and mild to moderate facet degenerative disease. No stenosis.  L3-4: Minimal disc bulge.  No stenosis.  L5-S1: Minimal disc bulge.  No stenosis.  S1-2: Transitional segment.  Negative.  IMPRESSION: Transitional lumbosacral anatomy. Please see numbering scheme above and correlate with plain films if any intervention is planned.  Mild lumbar degenerative change without central canal or foraminal narrowing. Scattered facet arthropathy is most notable at L3-4.   Electronically Signed   By: Inge Rise M.D.   On: 10/10/2019 10:56     Objective:  VS:  HT:    WT:   BMI:     BP:(!) 149/90  HR:100bpm  TEMP: ( )  RESP:  Physical Exam Constitutional:      General: He is not in acute distress.    Appearance: Normal appearance. He is not ill-appearing.  HENT:     Head: Normocephalic and atraumatic.     Right Ear: External ear normal.     Left Ear: External ear normal.  Eyes:     Extraocular Movements: Extraocular movements intact.  Cardiovascular:     Rate and Rhythm: Normal rate.     Pulses: Normal pulses.  Abdominal:     General: There is no distension.     Palpations: Abdomen is soft.  Musculoskeletal:        General: No tenderness or signs of injury.     Right lower leg: No edema.     Left lower leg: No edema.     Comments: Patient has good distal strength without clonus.  Skin:    Findings: No erythema or rash.  Neurological:     General: No focal deficit present.     Mental Status: He is alert and  oriented to person, place, and time.     Sensory: No sensory deficit.     Motor: No weakness or abnormal muscle tone.     Coordination: Coordination normal.  Psychiatric:        Mood and Affect: Mood normal.        Behavior: Behavior normal.      Imaging: XR C-ARM NO REPORT  Result Date: 01/25/2020 Please see Notes tab for imaging impression.

## 2020-01-26 ENCOUNTER — Telehealth: Payer: Self-pay | Admitting: Physical Medicine and Rehabilitation

## 2020-01-26 NOTE — Telephone Encounter (Signed)
Received vm from [facility] w/ [place] status of request. IC,lmvm advising request was processed by [facility] on 3/24,left phone number.

## 2020-02-01 ENCOUNTER — Telehealth (INDEPENDENT_AMBULATORY_CARE_PROVIDER_SITE_OTHER): Payer: PRIVATE HEALTH INSURANCE | Admitting: Psychiatry

## 2020-02-01 ENCOUNTER — Other Ambulatory Visit: Payer: Self-pay

## 2020-02-01 ENCOUNTER — Encounter (HOSPITAL_COMMUNITY): Payer: Self-pay | Admitting: Psychiatry

## 2020-02-01 ENCOUNTER — Other Ambulatory Visit: Payer: Self-pay | Admitting: "Endocrinology

## 2020-02-01 DIAGNOSIS — F321 Major depressive disorder, single episode, moderate: Secondary | ICD-10-CM

## 2020-02-01 MED ORDER — ALPRAZOLAM 0.5 MG PO TABS: 0.5000 mg | ORAL_TABLET | Freq: Three times a day (TID) | ORAL | 2 refills | Status: DC | PRN

## 2020-02-01 MED ORDER — TRAZODONE HCL 50 MG PO TABS: 50.0000 mg | ORAL_TABLET | Freq: Every day | ORAL | 2 refills | Status: DC

## 2020-02-01 MED ORDER — SERTRALINE HCL 100 MG PO TABS: 150.0000 mg | ORAL_TABLET | Freq: Every day | ORAL | 2 refills | Status: DC

## 2020-02-01 NOTE — Progress Notes (Signed)
[facility] MD/PA/NP OP Progress Note  02/01/2020 2:11 PM Jared Griffin  MRN:  [id number]  Chief Complaint:  Chief Complaint    Depression; Anxiety; Follow-up     HPI: This patient is a 59 year old married white male lives with his wife and 28 year old son in [place]. 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 zone but had an arm injury at work and had to quit there as well.  The patient was referred by his primary physician, Dr. [name], for further assessment and treatment of anxiety and depression.  The patient states that he is become increasingly depressed and anxious over the last couple of years due to work stress. He stated that he was always under pressure to get more things done than he had time to do. 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 He got to the point of hating going to work. Several years ago he had cardiac problems and had a stent placed. In January of this year he began developing dizzy spells chest pain headaches stomach aches. It got so bad that he was readmitted for cardiac catheterization but nothing new was found.  The patient was having difficulty sleeping constant worry and anxiety about his job sadness and anger. He was getting irritable with people at home. After talking to his primary care physician at length and decided to take him out on medical retirement. The patient has been on Wellbutrin SR 150 mg twice a day for several weeks now. He had also been out in the past when his wife was sick. He has not seen any benefit from this but does feel tremendously better since he stopped working. He is also on Xanax 0.5 mg 3 times a day which he thinks has helped to some degree.  Since stopping working he's had a big turnaround. His sleep and energy have improved.  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 He is much less anxious and is no longer having the chest pain dizzy spells or stomachaches. He denies suicidal ideation or psychotic symptoms. He does not use drugs or alcohol  The patient returns for follow-up after 3 months.  He states for a while he was getting pretty depressed with chronic back pain and arm pain but he is doing somewhat better now.  He got an injection in his back last week and it seems to have helped.  He is going to the gym a bit with his wife and walking on the treadmill.  He is still trying to fight to get Worker's Comp. to help pay for evaluations on his arm.  His sleep has improved to some degree since the injection.  He denies suicidal ideation or thoughts about self-harm.  His energy is getting a little bit better and the Xanax continues to help with his anxiety Visit Diagnosis:    ICD-10-CM   1. Moderate single current episode of major depressive disorder ([place])  F32.1     Past Psychiatric History: none  Past Medical History:  Past Medical History:  Diagnosis Date  . Anginal pain ([place])   . Anxiety   . 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 COPD (chronic obstructive pulmonary disease) ([place])    very close to being diagnosed  . Coronary  artery disease   . Coronary atherosclerosis of native coronary artery    a. 12/09/2013: s/p PCI with 3.0 x 28 Promus DES to mLAD  . Cyst (solitary) of breast 10/03/2017   removed from back  . Depression   . Dyspnea    take inhaler  . Elbow injury 07/25/2017   surgery for torn tendon  . Essential hypertension, benign   . Fatty liver disease, nonalcoholic   . Lateral epicondylitis of right elbow   . Mixed hyperlipidemia 2006  . Obesity   . Osgood-Schlatter's disease of right knee   . Type 2 diabetes mellitus ([place])     Past Surgical History:  Procedure Laterality Date  . BIOPSY  01/21/2019   Procedure: BIOPSY;  Surgeon: [name], MD;   [facility];  Service: Endoscopy;;  gastric bx and gastric polyps  . CARDIAC CATHETERIZATION  2011  . CARDIAC CATHETERIZATION N/A 09/20/2015   Procedure: Left Heart Cath and Coronary Angiography;  Surgeon: [name], MD;  Location: [facility];  Service: Cardiovascular;  Laterality: N/A;  . CHOLECYSTECTOMY  2003  . COLONOSCOPY  06/19/2012   Procedure: COLONOSCOPY;  Surgeon: [name], MD;  [facility];  Service: Endoscopy;  Laterality: N/A;  930  . COLONOSCOPY N/A 10/17/2017   Procedure: COLONOSCOPY;  Surgeon: [name], MD;  [facility];  Service: Endoscopy;  Laterality: N/A;  1225  . CYST EXCISION  07/2019  . ESOPHAGOGASTRODUODENOSCOPY (EGD) WITH PROPOFOL N/A 01/21/2019   Procedure: ESOPHAGOGASTRODUODENOSCOPY (EGD) WITH PROPOFOL;  Surgeon: [name], MD;  [facility];  Service: Endoscopy;  Laterality: N/A;  10:15am  . KNEE ARTHROPLASTY Right 1999  . LEFT HEART CATH AND CORONARY ANGIOGRAPHY N/A 02/26/2018   Procedure: LEFT HEART CATH AND CORONARY ANGIOGRAPHY;  Surgeon: [name], MD;  Location: [facility];  Service: Cardiovascular;  Laterality: N/A;  . LEFT HEART CATHETERIZATION WITH CORONARY ANGIOGRAM N/A 12/09/2013   Procedure: LEFT HEART CATHETERIZATION WITH CORONARY ANGIOGRAM;  Surgeon: [name], MD;  Location: [facility];  Service: Cardiovascular;  Laterality: N/A;  . Liver biopsy    . POLYPECTOMY  10/17/2017   Procedure: POLYPECTOMY;  Surgeon: [name], MD;  [facility];  Service: Endoscopy;;  ascending colon, splenic flexure,sigmoid x2  . TENNIS ELBOW RELEASE/NIRSCHEL PROCEDURE Right 07/25/2017   Procedure: TENNIS ELBOW RELEASE and debriedment;  Surgeon: [name], MD;  Location: [facility];  Service: Orthopedics;  Laterality: Right;  . TONSILLECTOMY  1970's    Family Psychiatric History: none  Family History:  Family History  Problem Relation Age  of Onset  . Cancer Maternal Aunt   . CVA Maternal Grandmother   . CVA Maternal Grandfather     Social History:  Social History   Socioeconomic History  . Marital status: Married    Spouse name: Not on file  . Number of children: Not on file  . Years of education: Not on file  . Highest education level: Not on file  Occupational History  . Not on file  Tobacco Use  . Smoking status: Former Smoker    Packs/day: 0.50    Years: 10.00    Pack years: 5.00    Types: Cigarettes    Start date: 02/02/1973    Quit date: 09/17/1983    Years since quitting: 36.4  . Smokeless tobacco: Former Systems developer    Types: [place] date: 09/17/1983  Substance and Sexual Activity  . Alcohol use: No    Alcohol/week: 0.0 standard drinks  . Drug use: No  . Sexual activity: Yes  Other Topics Concern  . 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    Married with 1 child   Right handed    12 th    Very little caffeine   Social Determinants of Radio broadcast assistant Strain:   . Difficulty of Paying Living Expenses:   Food Insecurity:   . Worried About Charity fundraiser in the Last Year:   . Arboriculturist in the Last Year:   Transportation Needs:   . Film/video editor (Medical):   Marland Kitchen Lack of Transportation (Non-Medical):   Physical Activity:   . Days of Exercise per Week:   . Minutes of Exercise per Session:   Stress:   . Feeling of Stress :   Social Connections:   . Frequency of Communication with Friends and Family:   . Frequency of Social Gatherings with Friends and Family:   . Attends Religious Services:   . Active Member of Clubs or Organizations:   . Attends Archivist Meetings:   Marland Kitchen Marital Status:     Allergies:  Allergies  Allergen Reactions  . Metoprolol Rash    Metabolic Disorder Labs: Lab Results  Component Value Date   HGBA1C 7.5 (A) 12/24/2019   MPG 211.6  07/12/2018   MPG 197.25 07/23/2017   No results found for: PROLACTIN Lab Results  Component Value Date   CHOL 116 10/18/2018   TRIG 173 (H) 10/18/2018   HDL 30 (L) 10/18/2018   CHOLHDL 3.9 10/18/2018   VLDL UNABLE TO CALCULATE IF TRIGLYCERIDE OVER 400 mg/dL 07/12/2018   LDLCALC 51 10/18/2018   LDLCALC UNABLE TO CALCULATE IF TRIGLYCERIDE OVER 400 mg/dL 07/12/2018   Lab Results  Component Value Date   TSH 0.648 08/20/2019   TSH 0.65 08/20/2019    Therapeutic Level Labs: No results found for: LITHIUM No results found for: VALPROATE No components found for:  CBMZ  Current Medications: Current Outpatient Medications  Medication Sig Dispense Refill  . albuterol (VENTOLIN HFA) 108 (90 Base) MCG/ACT inhaler INHALE 2 PUFFS INTO LUNGS EVERY 6 HOURS AS NEEDED FOR WHEEZING AND SHORTNESS OF BREATH. 8.5 g 5  . ALPRAZolam (XANAX) 0.5 MG tablet Take 1 tablet (0.5 mg total) by mouth 3 (three) times daily as needed for sleep or anxiety. 90 tablet 2  . aspirin (ASPIR-LOW) 81 MG EC tablet Take 1 tablet (81 mg total) by mouth daily. 30 tablet 12  . atorvastatin (LIPITOR) 40 MG tablet TAKE (1) TABLET BY MOUTH ONCE DAILY. 90 tablet 0  . B-D UF III MINI PEN NEEDLES 31G X 5 MM MISC USE UP TO 4 TIMES DAILY WITH INSULIN AS DIRECTED. 150 each 5  . benzonatate (TESSALON) 100 MG capsule Take 1 capsule (100 mg total) by mouth every 8 (eight) hours. (Patient taking differently: Take 100 mg by mouth every 8 (eight) hours. Takes as needed) 21 capsule 0  . celecoxib (CELEBREX) 200 MG capsule Take 1 capsule (200 mg total) by mouth 2 (two) times daily. 14 capsule 0  . cetirizine (ZYRTEC) 10 MG tablet Take 1 tablet (10 mg total) by mouth daily. 30 tablet 0  . Cholecalciferol (VITAMIN D3) 125 MCG (5000 UT) CAPS Take 1 capsule (5,000 Units total) by mouth daily.  90 capsule 0  . Continuous Blood Gluc Sensor (FREESTYLE LIBRE 14 DAY SENSOR) MISC Inject 1 each into the skin every 14 (fourteen) days. Use as directed. 2 each  2  . diclofenac (CATAFLAM) 50 MG tablet Take 1 tablet (50 mg total) by mouth 2 (two) times daily. 90 tablet 3  . diltiazem (CARDIZEM) 30 MG tablet TAKE (1) TABLET BY MOUTH TWICE DAILY. 180 tablet 1  . fluticasone (FLONASE) 50 MCG/ACT nasal spray Place 2 sprays into both nostrils daily. 16 g 0  . fluticasone-salmeterol (ADVAIR HFA) 115-21 MCG/ACT inhaler Inhale 2 puffs into the lungs 2 (two) times daily. 1 Inhaler 5  . gabapentin (NEURONTIN) 300 MG capsule Take 1 capsule (300 mg total) by mouth 4 (four) times daily. 120 capsule 5  . glipiZIDE (GLUCOTROL XL) 5 MG 24 hr tablet TAKE (2) TABLETS BY MOUTH ONCE DAILY WITH BREAKFAST. 180 tablet 0  . glucose blood (CONTOUR NEXT TEST) test strip 1 each by Other route 4 (four) times daily. Use as instructed 150 each 5  . HYDROcodone-acetaminophen (NORCO/VICODIN) 5-325 MG tablet Take 1 tablet by mouth every 6 (six) hours as needed for moderate pain. 30 tablet 0  . Icosapent Ethyl (VASCEPA) 1 g CAPS Take 2 capsules (2 g total) by mouth 2 (two) times a day. 120 capsule 11  . insulin aspart (NOVOLOG FLEXPEN) 100 UNIT/ML FlexPen Inject 15-21 Units into the skin 3 (three) times daily before meals. 10 pen 2  . Lancets MISC 1 each by Does not apply route 4 (four) times daily. 150 each 5  . losartan (COZAAR) 25 MG tablet TAKE (1) TABLET BY MOUTH ONCE DAILY. 90 tablet 2  . meclizine (ANTIVERT) 25 MG tablet Take 1 tablet (25 mg total) by mouth 3 (three) times daily as needed for dizziness. 30 tablet 0  . metFORMIN (GLUCOPHAGE) 500 MG tablet TAKE 2 TABLETS BY MOUTH TWICE DAILY WITH A MEAL. 360 tablet 0  . nitroGLYCERIN (NITROSTAT) 0.4 MG SL tablet Place 1 tablet (0.4 mg total) under the tongue every 5 (five) minutes as needed for chest pain. 25 tablet 3  . pantoprazole (PROTONIX) 40 MG tablet TAKE (1) TABLET BY MOUTH ONCE DAILY. 90 tablet 0  . ranolazine (RANEXA) 1000 MG SR tablet TAKE (1) TABLET BY MOUTH TWICE DAILY. 60 tablet 11  . sertraline (ZOLOFT) 100 MG tablet  Take 1.5 tablets (150 mg total) by mouth daily. 135 tablet 2  . TOUJEO MAX SOLOSTAR 300 UNIT/ML Solostar Pen INJECT 130 UNITS INTO THE SKIN AT BEDTIME. 5 pen 0  . traZODone (DESYREL) 50 MG tablet Take 1 tablet (50 mg total) by mouth at bedtime. 30 tablet 2   No current facility-administered medications for this visit.     Musculoskeletal: Strength & Muscle Tone: flaccid Gait & Station: normal Patient leans: N/A  Psychiatric Specialty Exam: Review of Systems  Musculoskeletal: Positive for arthralgias, back pain and neck pain.  All other systems reviewed and are negative.   There were no vitals taken for this visit.There is no height or weight on file to calculate BMI.  General Appearance: Casual, Neat and Well Groomed  Eye Contact:  Good  Speech:  Clear and Coherent  Volume:  Normal  Mood:  Euthymic  Affect:  Appropriate and Congruent  Thought Process:  Goal Directed  Orientation:  Full (Time, Place, and Person)  Thought Content: Rumination   Suicidal Thoughts:  No  Homicidal Thoughts:  No  Memory:  Immediate;   Good Recent;   Good Remote;  Good  Judgement:  Good  Insight:  Good  Psychomotor Activity:  Decreased  Concentration:  Concentration: Good and Attention Span: Good  Recall:  Good  Fund of Knowledge: Good  Language: Good  Akathisia:  No  Handed:  Right  AIMS (if indicated): not done  Assets:  Communication Skills Desire for Improvement Resilience Social Support Talents/Skills  ADL's:  Intact  Cognition: WNL  Sleep:  Fair   Screenings: GAD-7     Office Visit from 08/05/2018 in [facility]  Total GAD-7 Score  5    PHQ2-9     Office Visit from 12/09/2019 in [facility] Nutrition from 08/06/2018 in [facility] Office Visit from 08/05/2018 in [facility] Visit from 07/24/2018 in [facility] Office Visit from 12/02/2017 in [facility]  PHQ-2 Total Score  5  0  1  0  0  PHQ-9 Total Score  11  --  3  --  1       Assessment and Plan: This patient is a 59 year old male with a history of significant depression and anxiety as well as chronic pain.  He continues to do well on his current regimen.  He will continue Zoloft 150 mg daily for depression, trazodone 50 mg at bedtime as needed for sleep and Xanax 0.5 mg 3 times daily as needed for anxiety.  He will return to see me in 3 months   [name], MD 02/01/2020, 2:11 PM

## 2020-02-05 ENCOUNTER — Encounter (HOSPITAL_COMMUNITY): Payer: Self-pay | Admitting: *Deleted

## 2020-02-05 ENCOUNTER — Telehealth: Payer: PRIVATE HEALTH INSURANCE | Admitting: Family Medicine

## 2020-02-05 ENCOUNTER — Emergency Department (HOSPITAL_COMMUNITY)
Admission: EM | Admit: 2020-02-05 | Discharge: 2020-02-05 | Disposition: A | Discharge status: Home / Self Care | Payer: PRIVATE HEALTH INSURANCE | Attending: Emergency Medicine | Admitting: Emergency Medicine

## 2020-02-05 ENCOUNTER — Emergency Department (HOSPITAL_COMMUNITY): Payer: PRIVATE HEALTH INSURANCE

## 2020-02-05 ENCOUNTER — Other Ambulatory Visit: Payer: Self-pay

## 2020-02-05 ENCOUNTER — Ambulatory Visit (INDEPENDENT_AMBULATORY_CARE_PROVIDER_SITE_OTHER)
Admission: EM | Admit: 2020-02-05 | Discharge: 2020-02-05 | Disposition: A | Discharge status: Home / Self Care | Payer: PRIVATE HEALTH INSURANCE | Source: Home / Self Care

## 2020-02-05 DIAGNOSIS — Z794 Long term (current) use of insulin: Secondary | ICD-10-CM | POA: Insufficient documentation

## 2020-02-05 DIAGNOSIS — I10 Essential (primary) hypertension: Secondary | ICD-10-CM | POA: Diagnosis not present

## 2020-02-05 DIAGNOSIS — E119 Type 2 diabetes mellitus without complications: Secondary | ICD-10-CM | POA: Insufficient documentation

## 2020-02-05 DIAGNOSIS — I2510 Atherosclerotic heart disease of native coronary artery without angina pectoris: Secondary | ICD-10-CM | POA: Diagnosis not present

## 2020-02-05 DIAGNOSIS — Z87891 Personal history of nicotine dependence: Secondary | ICD-10-CM | POA: Diagnosis not present

## 2020-02-05 DIAGNOSIS — R519 Headache, unspecified: Secondary | ICD-10-CM | POA: Diagnosis not present

## 2020-02-05 DIAGNOSIS — J449 Chronic obstructive pulmonary disease, unspecified: Secondary | ICD-10-CM | POA: Diagnosis not present

## 2020-02-05 DIAGNOSIS — Z79899 Other long term (current) drug therapy: Secondary | ICD-10-CM | POA: Insufficient documentation

## 2020-02-05 MED ORDER — LIDOCAINE 5 % EX PTCH: 1.0000 | MEDICATED_PATCH | CUTANEOUS | 0 refills | Status: DC

## 2020-02-05 MED ORDER — MAGNESIUM SULFATE 2 GM/50ML IV SOLN
2.0000 g | Freq: Once | INTRAVENOUS | Status: AC
  Administered 2020-02-05: 2 g via INTRAVENOUS
  Filled 2020-02-05: qty 50

## 2020-02-05 MED ORDER — BUTALBITAL-APAP-CAFFEINE 50-325-40 MG PO TABS
1.0000 | ORAL_TABLET | Freq: Once | ORAL | Status: AC
  Administered 2020-02-05: 1 via ORAL
  Filled 2020-02-05: qty 1

## 2020-02-05 MED ORDER — DROPERIDOL 2.5 MG/ML IJ SOLN
2.5000 mg | Freq: Once | INTRAMUSCULAR | Status: AC
  Administered 2020-02-05: 2.5 mg via INTRAVENOUS
  Filled 2020-02-05: qty 2

## 2020-02-05 MED ORDER — DIPHENHYDRAMINE HCL 50 MG/ML IJ SOLN
12.5000 mg | Freq: Once | INTRAMUSCULAR | Status: AC
  Administered 2020-02-05: 12.5 mg via INTRAVENOUS
  Filled 2020-02-05: qty 1

## 2020-02-05 MED ORDER — SODIUM CHLORIDE 0.9 % IV BOLUS
500.0000 mL | Freq: Once | INTRAVENOUS | Status: AC
  Administered 2020-02-05: 500 mL via INTRAVENOUS

## 2020-02-05 MED ORDER — LIDOCAINE 5 % EX PTCH
1.0000 | MEDICATED_PATCH | CUTANEOUS | Status: DC
  Administered 2020-02-05: 1 via TRANSDERMAL
  Filled 2020-02-05 (×2): qty 1

## 2020-02-05 MED ORDER — PROCHLORPERAZINE EDISYLATE 10 MG/2ML IJ SOLN
10.0000 mg | Freq: Once | INTRAMUSCULAR | Status: AC
  Administered 2020-02-05: 10 mg via INTRAVENOUS
  Filled 2020-02-05: qty 2

## 2020-02-05 NOTE — ED Triage Notes (Signed)
Pt c/o sudden worst headache of his life at 0700. Norco and Tylenol taken at home without relief. Hx of same with hospitalization

## 2020-02-05 NOTE — Discharge Instructions (Addendum)
You were seen in the ER today for a headache.  Your CT scan did not show any acute abnormalities- no bleeding or masses.   You were given medicines in the ER to treat the headache with some improvement. Should the headache return at home please take tylenol per over the counter dosing. We are sending you home with a lidocaine patch to apply to the area of pain in your neck once per day as needed for pain in this area to help soothe the muscle.   We have prescribed you new medication(s) today. Discuss the medications prescribed today with your pharmacist as they can have adverse effects and interactions with your other medicines including over the counter and prescribed medications. Seek medical evaluation if you start to experience new or abnormal symptoms after taking one of these medicines, seek care immediately if you start to experience difficulty breathing, feeling of your throat closing, facial swelling, or rash as these could be indications of a more serious allergic reaction  Given you have had more frequent headaches we would like you to follow up with a neurologist, please call Dr. Freddie Apley office on Monday to schedule an appointment within the next 1 week. Return to the ED for new or worsening symptoms including but not limited to worsened pain, sudden change in headache, change in your vision, numbness, weakness, passing out, seizure activity, fever, neck stiffness, or any other concerns.

## 2020-02-05 NOTE — ED Notes (Signed)
Patient is being discharged from the Urgent [facility] and sent to the Emergency Department via [facility] by wife., patient is stable but in need of higher level of care due to worst headache of my life. Patient is aware and verbalizes understanding of plan of care.  Discharged per Faustino Congress, NP Vitals:   02/05/20 1138  BP: (!) 164/105  Pulse: 91  Resp: 18  Temp: 97.8 F (36.6 C)  SpO2: 97%

## 2020-02-05 NOTE — ED Provider Notes (Signed)
[facility] EMERGENCY DEPARTMENT Provider Note   CSN: [id number] Arrival date & time: 02/05/20  1202     History Chief Complaint  Patient presents with   Headache    Jared Griffin is a 59 y.o. male with a history of COPD, CAD, hypertension, type 2 diabetes mellitus, cirrhosis of the liver, and hyperlipidemia who presents to the emergency department with complaints of headache that began this morning. Patient states headache had fairly quick onset, but not sudden thunderclap type headache, this AM around 7 oclock. He reports pain has seemed to gradually worsen since onset. Discomfort is located to the front/top/back of his head bilaterally, more so on the R side. He states it is worse with head movements & bright lights. No alleviating factors. Took a hydrocodone PTA without much relief. He has associated nausea without vomiting. He reports he has had issues with frequent headaches over the past 1 year, was told these could be migraines, he states this headache feels very similar in quality to prior, just was somewhat worse. He denies visual disturbance, numbness, weakness, syncope, head injury, fever, or chills. Sent to ED from urgent care for further evaluation.   HPI     Past Medical History:  Diagnosis Date   Anginal pain ([place])    Anxiety    Cirrhosis of liver without mention of alcohol 2003   COPD (chronic obstructive pulmonary disease) ([place])    very close to being diagnosed   Coronary artery disease    Coronary atherosclerosis of native coronary artery    a. 12/09/2013: s/p PCI with 3.0 x 28 Promus DES to mLAD   Cyst (solitary) of breast 10/03/2017   removed from back   Depression    Dyspnea    take inhaler   Elbow injury 07/25/2017   surgery for torn tendon   Essential hypertension, benign    Fatty liver disease, nonalcoholic    Lateral epicondylitis of right elbow    Mixed hyperlipidemia 2006   Obesity    Osgood-Schlatter's disease of right knee     Type 2 diabetes mellitus ([place])     Patient Active Problem List   Diagnosis Date Noted   Abdominal pain, epigastric 01/13/2019   Anxiety 01/04/2019   Radicular pain of left lower extremity 12/22/2018   Moderate persistent asthma 10/25/2018   Dizziness 07/12/2018   Nonintractable headache    Abnormal nuclear stress test    Aftercare following surgery 07/25/17 08/13/2017   Hx of colonic polyps 08/07/2017   Lateral epicondylitis of right elbow    Cervical nerve root impingement 12/09/2015   Generalized anxiety disorder 09/27/2015   Chest pain 09/20/2015   Pain in the chest    Depression 01/24/2015   Esophageal reflux 01/24/2015   Preoperative cardiovascular examination 06/28/2014   DM type 2 causing vascular disease ([place])    Mixed hyperlipidemia    Hepatic cirrhosis ([place])    Fatty liver disease, nonalcoholic    Obesity    Intermediate coronary syndrome ([place]) 12/09/2013   Fatty liver 05/05/2013   Lumbago 05/21/2012   Essential hypertension, benign 12/28/2009   CAD S/P LAD DES March 2015 12/28/2009   Hyperlipidemia LDL goal <70 11/25/2009   Accelerating angina ([place]) 11/25/2009   DM 11/24/2009   ABDOMINAL PAIN, HX OF 11/24/2009    Past Surgical History:  Procedure Laterality Date   BIOPSY  01/21/2019   Procedure: BIOPSY;  Surgeon: [name], MD;  [facility];  Service: Endoscopy;;  gastric bx and gastric  polyps   CARDIAC CATHETERIZATION  2011   CARDIAC CATHETERIZATION N/A 09/20/2015   Procedure: Left Heart Cath and Coronary Angiography;  Surgeon: [name], MD;  Location: [facility] CV LAB;  Service: Cardiovascular;  Laterality: N/A;   CHOLECYSTECTOMY  2003   COLONOSCOPY  06/19/2012   Procedure: COLONOSCOPY;  Surgeon: [name], MD;  Location: [facility];  Service: Endoscopy;  Laterality: N/A;  930   COLONOSCOPY N/A 10/17/2017   Procedure: COLONOSCOPY;  Surgeon: [name], MD;  Location: [facility]  END[facility];  Service: Endoscopy;  Laterality: N/A;  1225   CYST EXCISION  07/2019   ESOPHAGOGASTRODUODENOSCOPY (EGD) WITH PROPOFOL N/A 01/21/2019   Procedure: ESOPHAGOGASTRODUODENOSCOPY (EGD) WITH PROPOFOL;  Surgeon: [name], MD;  [facility];  Service: Endoscopy;  Laterality: N/A;  10:15am   KNEE ARTHROPLASTY Right 1999   LEFT HEART CATH AND CORONARY ANGIOGRAPHY N/A 02/26/2018   Procedure: LEFT HEART CATH AND CORONARY ANGIOGRAPHY;  Surgeon: [name], MD;  Location: [place] [facility] LAB;  Service: Cardiovascular;  Laterality: N/A;   LEFT HEART CATHETERIZATION WITH CORONARY ANGIOGRAM N/A 12/09/2013   Procedure: LEFT HEART CATHETERIZATION WITH CORONARY ANGIOGRAM;  Surgeon: [name], MD;  Location: [facility] CATH LAB;  Service: Cardiovascular;  Laterality: N/A;   Liver biopsy     POLYPECTOMY  10/17/2017   Procedure: POLYPECTOMY;  Surgeon: [name], MD;  [facility];  Service: Endoscopy;;  ascending colon, splenic flexure,sigmoid x2   TENNIS ELBOW RELEASE/NIRSCHEL PROCEDURE Right 07/25/2017   Procedure: TENNIS ELBOW RELEASE and debriedment;  Surgeon: [name], MD;  Location: [facility] ORS;  Service: Orthopedics;  Laterality: Right;   TONSILLECTOMY  1970's       Family History  Problem Relation Age of Onset   Cancer Maternal Aunt    CVA Maternal Grandmother    CVA Maternal Grandfather     Social History   Tobacco Use   Smoking status: Former Smoker    Packs/day: 0.50    Years: 10.00    Pack years: 5.00    Types: Cigarettes    Start date: 02/02/1973    Quit date: 09/17/1983    Years since quitting: 36.4   Smokeless tobacco: Former Systems developer    Types: Chew    Quit date: 09/17/1983  Substance Use Topics   Alcohol use: No    Alcohol/week: 0.0 standard drinks   Drug use: No    Home Medications Prior to Admission medications   Medication Sig Start Date End Date Taking? Authorizing Provider  albuterol (VENTOLIN HFA)  108 (90 Base) MCG/ACT inhaler INHALE 2 PUFFS INTO LUNGS EVERY 6 HOURS AS NEEDED FOR WHEEZING AND SHORTNESS OF BREATH. 08/10/19   Mikey Kirschner, MD  ALPRAZolam Duanne Moron) 0.5 MG tablet Take 1 tablet (0.5 mg total) by mouth 3 (three) times daily as needed for sleep or anxiety. 02/01/20 01/31/21  Cloria Spring, MD  aspirin (ASPIR-LOW) 81 MG EC tablet Take 1 tablet (81 mg total) by mouth daily. 01/22/19   Rehman, Mechele Dawley, MD  atorvastatin (LIPITOR) 40 MG tablet TAKE (1) TABLET BY MOUTH ONCE DAILY. 12/14/19   Herminio Commons, MD  B-D UF III MINI PEN NEEDLES 31G X 5 MM MISC USE UP TO 4 TIMES DAILY WITH INSULIN AS DIRECTED. 01/25/20   Nida, Marella Chimes, MD  benzonatate (TESSALON) 100 MG capsule Take 1 capsule (100 mg total) by mouth every 8 (eight) hours. Patient taking differently: Take 100 mg by mouth  every 8 (eight) hours. Takes as needed 09/13/19   Wurst, [place], PA-C  celecoxib (CELEBREX) 200 MG capsule Take 1 capsule (200 mg total) by mouth 2 (two) times daily. 05/06/19   [name], MD  cetirizine (ZYRTEC) 10 MG tablet Take 1 tablet (10 mg total) by mouth daily. 09/13/19   Wurst, [place], PA-C  Cholecalciferol (VITAMIN D3) 125 MCG (5000 UT) CAPS Take 1 capsule (5,000 Units total) by mouth daily. 08/26/19   Cassandria Anger, MD  Continuous Blood Gluc Sensor (FREESTYLE LIBRE 14 DAY SENSOR) MISC Inject 1 each into the skin every 14 (fourteen) days. Use as directed. 12/24/19   Cassandria Anger, MD  diclofenac (CATAFLAM) 50 MG tablet Take 1 tablet (50 mg total) by mouth 2 (two) times daily. 04/27/19   [name], MD  diltiazem (CARDIZEM) 30 MG tablet TAKE (1) TABLET BY MOUTH TWICE DAILY. 12/09/19   Mikey Kirschner, MD  fluticasone (FLONASE) 50 MCG/ACT nasal spray Place 2 sprays into both nostrils daily. 09/13/19   Wurst, [place], PA-C  fluticasone-salmeterol (ADVAIR HFA) 970-26 MCG/ACT inhaler Inhale 2 puffs into the lungs 2 (two) times daily. 12/09/19   Mikey Kirschner,  MD  gabapentin (NEURONTIN) 300 MG capsule Take 1 capsule (300 mg total) by mouth 4 (four) times daily. 12/09/19   Mikey Kirschner, MD  glipiZIDE (GLUCOTROL XL) 5 MG 24 hr tablet TAKE (2) TABLETS BY MOUTH ONCE DAILY WITH BREAKFAST. 02/01/20   Cassandria Anger, MD  glucose blood (CONTOUR NEXT TEST) test strip 1 each by Other route 4 (four) times daily. Use as instructed 09/24/19   Cassandria Anger, MD  HYDROcodone-acetaminophen (NORCO/VICODIN) 5-325 MG tablet Take 1 tablet by mouth every 6 (six) hours as needed for moderate pain. 11/03/19   [name], MD  Icosapent Ethyl (VASCEPA) 1 g CAPS Take 2 capsules (2 g total) by mouth 2 (two) times a day. 01/22/19   Herminio Commons, MD  insulin aspart (NOVOLOG FLEXPEN) 100 UNIT/ML FlexPen Inject 15-21 Units into the skin 3 (three) times daily before meals. 09/24/19   Cassandria Anger, MD  Lancets MISC 1 each by Does not apply route 4 (four) times daily. 09/24/19   Cassandria Anger, MD  losartan (COZAAR) 25 MG tablet TAKE (1) TABLET BY MOUTH ONCE DAILY. 12/24/19   Herminio Commons, MD  meclizine (ANTIVERT) 25 MG tablet Take 1 tablet (25 mg total) by mouth 3 (three) times daily as needed for dizziness. 04/25/18   Mikey Kirschner, MD  metFORMIN (GLUCOPHAGE) 500 MG tablet TAKE 2 TABLETS BY MOUTH TWICE DAILY WITH A MEAL. 11/04/19   Cassandria Anger, MD  nitroGLYCERIN (NITROSTAT) 0.4 MG SL tablet Place 1 tablet (0.4 mg total) under the tongue every 5 (five) minutes as needed for chest pain. 03/26/16   Herminio Commons, MD  pantoprazole (PROTONIX) 40 MG tablet TAKE (1) TABLET BY MOUTH ONCE DAILY. 12/02/19   Mikey Kirschner, MD  ranolazine (RANEXA) 1000 MG SR tablet TAKE (1) TABLET BY MOUTH TWICE DAILY. 10/08/19   Herminio Commons, MD  sertraline (ZOLOFT) 100 MG tablet Take 1.5 tablets (150 mg total) by mouth daily. 02/01/20   Cloria Spring, MD  TOUJEO MAX SOLOSTAR 300 UNIT/ML Solostar Pen INJECT 130 UNITS INTO THE SKIN AT  BEDTIME. 12/15/19   Cassandria Anger, MD  traZODone (DESYREL) 50 MG tablet Take 1 tablet (50 mg total) by mouth at bedtime. 02/01/20   Cloria Spring, MD  Allergies    Metoprolol  Review of Systems   Review of Systems  Constitutional: Negative for chills and fever.  Eyes: Negative for visual disturbance.  Respiratory: Negative for shortness of breath.   Cardiovascular: Negative for chest pain.  Gastrointestinal: Positive for nausea. Negative for abdominal pain, constipation and vomiting.  Neurological: Positive for headaches. Negative for dizziness, seizures, syncope, facial asymmetry, weakness and numbness.  All other systems reviewed and are negative.   Physical Exam Updated Vital Signs BP (!) 163/100    Pulse 92    Temp 98.2 F (36.8 C)    Resp 20    SpO2 93%   Physical Exam Vitals and nursing note reviewed.  Constitutional:      General: He is not in acute distress.    Appearance: Normal appearance. He is not toxic-appearing.  HENT:     Head: Normocephalic and atraumatic.     Comments: No tenderness over the temporal arteries.    Mouth/Throat:     Pharynx: Oropharynx is clear. Uvula midline.  Eyes:     General: Vision grossly intact. Gaze aligned appropriately.     Extraocular Movements: Extraocular movements intact.     Conjunctiva/sclera: Conjunctivae normal.     Pupils: Pupils are equal, round, and reactive to light.     Comments: No proptosis.   Cardiovascular:     Rate and Rhythm: Normal rate and regular rhythm.  Pulmonary:     Effort: Pulmonary effort is normal.     Breath sounds: Normal breath sounds.  Abdominal:     General: There is no distension.     Palpations: Abdomen is soft.     Tenderness: There is no abdominal tenderness. There is no guarding or rebound.  Musculoskeletal:     Cervical back: Normal range of motion and neck supple. No rigidity. Muscular tenderness (right paraspinal muscle tenderness) present.  Skin:    General: Skin is warm  and dry.  Neurological:     Mental Status: He is alert.     Comments: Alert. Clear speech. No facial droop. CNIII-XII grossly intact. Bilateral upper and lower extremities' sensation grossly intact. 5/5 symmetric strength with grip strength and with plantar and dorsi flexion bilaterally . Normal finger to nose bilaterally. Negative pronator drift. Gait intact.    Psychiatric:        Mood and Affect: Mood normal.        Behavior: Behavior normal.    ED Results / Procedures / Treatments   Labs (all labs ordered are listed, but only abnormal results are displayed) Labs Reviewed - No data to display  EKG None  Radiology CT Head Wo Contrast  Result Date: 02/05/2020 CLINICAL DATA:  Severe headache with dizziness and blurred vision EXAM: CT HEAD WITHOUT CONTRAST TECHNIQUE: Contiguous axial images were obtained from the base of the skull through the vertex without intravenous contrast. COMPARISON:  July 12, 2018 FINDINGS: Brain: Ventricles and sulci are normal in size and configuration. There is no intracranial mass, hemorrhage, extra-axial fluid collection, or midline shift. The brain parenchyma appears unremarkable. No evident acute infarct. Vascular: No hyperdense vessel. There are foci of calcification in the carotid siphon regions. Skull: The bony calvarium appears intact. Sinuses/Orbits: There is mucosal thickening in several ethmoid air cells. Other visualized paranasal sinuses are clear. Orbits appear symmetric bilaterally. Other: Mastoid air cells are clear. IMPRESSION: Brain parenchyma appears unremarkable.  No mass or hemorrhage. There are foci of arterial vascular calcification. There is mucosal thickening in several ethmoid air cells.  Electronically Signed   By: Lowella Grip III M.D.   On: 02/05/2020 12:49    Procedures Procedures (including critical care time)  Medications Ordered in ED Medications  droperidol (INAPSINE) 2.5 MG/ML injection 2.5 mg (has no administration in  time range)  lidocaine (LIDODERM) 5 % 1 patch (1 patch Transdermal Patch Applied 02/05/20 1542)  prochlorperazine (COMPAZINE) injection 10 mg (10 mg Intravenous Given 02/05/20 1309)  diphenhydrAMINE (BENADRYL) injection 12.5 mg (12.5 mg Intravenous Given 02/05/20 1307)  sodium chloride 0.9 % bolus 500 mL (0 mLs Intravenous Stopped 02/05/20 1350)  butalbital-acetaminophen-caffeine (FIORICET) 50-325-40 MG per tablet 1 tablet (1 tablet Oral Given 02/05/20 1419)  magnesium sulfate IVPB 2 g 50 mL (0 g Intravenous Stopped 02/05/20 1513)    ED Course  I have reviewed the triage vital signs and the nursing notes.  Pertinent labs & imaging results that were available during my care of the patient were reviewed by me and considered in my medical decision making (see chart for details).    MDM Rules/Calculators/A&P                     Patient presents to the ED with complaints of headache. Nontoxic, vitals with elevated BP. Exam fairly benign.   Ddx: Migraine, tension headache, intracranial hemorrhage, subarachnoid hemorrhage, mass, meningitis, muscular strain, acute angle-closure glaucoma, temporal arteritis, dural venous sinus thrombosis..  Additional history obtained:  Additional history obtained from chart review and nursing note reviewed..  Imaging Studies ordered:  I ordered imaging studies which included CT head without contrast, I independently visualized and interpreted imaging which showed Brain parenchyma appears unremarkable.  No mass or hemorrhage. There are foci of arterial vascular calcification. There is mucosal thickening in several ethmoid air cells.  ED Course:  Patient presents to the emergency department with headache.  He has had a history of similar headaches, but stated this 1 was more severe therefore CT head was obtained and negative for acute process.  Given patient has had similar headaches, this had quick onset but not sudden, seemed to gradually worsen after onset as opposed  to maximum pain at onset, and his head CT was obtained within 6 hours and negative for acute process, low suspicion for subarachnoid hemorrhage.  Patient is afebrile and without nuchal rigidity, doubt meningitis.  He has no visual disturbance or proptosis or tenderness over the temporal artery area to raise concern for dural venous sinus thrombosis, acute angle-closure glaucoma, or temporal arteritis.  No focal neurologic deficits to raise concern for CVA.  Initially given migraine cocktail.  14:15: RE-EVAL: Headache mildly improved, but remains very uncomfortable, will trial magnesium and Fioricet.  15:10: RE-EVAL: Headache continues to improve, currently 6/10 in severity. Remains without neuro deficits or nuchal rigidity. Will trial lidoderm patch to right cervical paraspinal muscles & obtain EKG for trial of droperidol.   EKG with QTc within normal limits. Droperidol ordered.   Patient care signed out to Tammy Tripplett PA-C at change of shift pending re-assessment, short period of cardiac monitoring following Droperidol, and disposition. Anticipate discharge home with neurology follow up.    Findings and plan of care discussed with supervising physician Dr. Vanita Panda who is in agreement.   Portions of this note were generated with [employer]. Dictation errors may occur despite best attempts at proofreading.  Final Clinical Impression(s) / ED Diagnoses Final diagnoses:  Bad headache    Rx / DC Orders ED Discharge Orders    None  Leafy Kindle 02/05/20 1608    Carmin Muskrat, MD 02/06/20 1115

## 2020-02-05 NOTE — ED Provider Notes (Signed)
   Patient is a 59 year old male who was signed out to me by Jared Maes, PA-C pending reassessment.   Patient presented with headache.  Has history of same.  On my exam, patient well-appearing.  No focal neuro deficits and no nuchal rigidity.  Work-up today without acute findings.  He was given magnesium and Fioricet with minimal improvement.  EKG then ordered to evaluate QTc and droperidol ordered after EKG reviewed.  On recheck after droperidol, patient reports headache much improved and he states he is ready for discharge home.  He was provided with prescription for lidocaine patches and he will follow-up closely with neurology.  Return precautions discussed.   Jared Parkinson, PA-C 02/05/20 1942    Jared Ferguson, MD 02/07/20 1525

## 2020-02-05 NOTE — ED Triage Notes (Signed)
Headache onset this am

## 2020-02-09 ENCOUNTER — Other Ambulatory Visit: Payer: Self-pay | Admitting: Cardiovascular Disease

## 2020-02-09 ENCOUNTER — Other Ambulatory Visit: Payer: Self-pay | Admitting: "Endocrinology

## 2020-02-10 ENCOUNTER — Other Ambulatory Visit: Payer: Self-pay | Admitting: Cardiovascular Disease

## 2020-02-10 MED ORDER — NITROGLYCERIN 0.4 MG SL SUBL: 0.4000 mg | SUBLINGUAL_TABLET | SUBLINGUAL | 3 refills | Status: DC | PRN

## 2020-02-10 NOTE — Telephone Encounter (Signed)
    1. Which medications need to be refilled? (please list name of each medication and dose if known)  NITROGLYCERIN 0.4   2. Which pharmacy/location (including street and city if local pharmacy) is medication to be sent to?[place]   3. Do they need a 30 day or 90 day supply?

## 2020-02-10 NOTE — Telephone Encounter (Signed)
Refill complete 

## 2020-02-17 ENCOUNTER — Telehealth: Payer: Self-pay | Admitting: Family Medicine

## 2020-02-17 NOTE — Telephone Encounter (Signed)
Please contact pt to schedule appointment with Dr. Lovena Le

## 2020-02-17 NOTE — Telephone Encounter (Signed)
Patient is wanting new endocrinology doctor also not satisfied with Dr. Dorris Fetch

## 2020-02-17 NOTE — Telephone Encounter (Signed)
Needs appt to establish care. Thx, dr. Lovena Le

## 2020-02-18 NOTE — Telephone Encounter (Signed)
Schedule appointment on 6/8

## 2020-02-19 ENCOUNTER — Other Ambulatory Visit: Payer: Self-pay

## 2020-02-19 MED ORDER — NITROGLYCERIN 0.4 MG SL SUBL
0.4000 mg | SUBLINGUAL_TABLET | SUBLINGUAL | 3 refills | Status: DC | PRN
Start: 2020-02-19 — End: 2021-07-28

## 2020-02-19 NOTE — Telephone Encounter (Signed)
Refilled NTG to [facility]

## 2020-02-23 ENCOUNTER — Encounter: Payer: Self-pay | Admitting: Family Medicine

## 2020-02-23 ENCOUNTER — Ambulatory Visit (INDEPENDENT_AMBULATORY_CARE_PROVIDER_SITE_OTHER): Payer: PRIVATE HEALTH INSURANCE | Admitting: Family Medicine

## 2020-02-23 ENCOUNTER — Other Ambulatory Visit: Payer: Self-pay

## 2020-02-23 VITALS — BP 118/80 | HR 99 | Temp 97.5°F | Ht 74.5 in | Wt 272.0 lb

## 2020-02-23 DIAGNOSIS — E1159 Type 2 diabetes mellitus with other circulatory complications: Secondary | ICD-10-CM

## 2020-02-23 DIAGNOSIS — R0982 Postnasal drip: Secondary | ICD-10-CM

## 2020-02-23 DIAGNOSIS — J329 Chronic sinusitis, unspecified: Secondary | ICD-10-CM

## 2020-02-23 MED ORDER — GABAPENTIN 300 MG PO CAPS: 600.0000 mg | ORAL_CAPSULE | Freq: Two times a day (BID) | ORAL | 5 refills | Status: DC

## 2020-02-23 NOTE — Progress Notes (Signed)
Patient ID: Jared Griffin, male    DOB: 1961-05-13, 59 y.o.   MRN: [id number]   Chief Complaint  Patient presents with  . Referral    recurrent sinus issues  . Diabetes    wanting new endo referral   Subjective:    HPI  DM2- Seeing Dr. Dorris Fetch, endo for diabetes, but would like to switch to Dr. Chalmers Cater at [facility]. Wanting to switch due to feeling his blood glucose numbers aren't improving as much as he would like.   Pt currently on- glipizide, metformin, humalog pen, toujeo solostar pen.  Recording 4x per day numbers-  Morning glucose- 90s occ, 120-160s.  During day 200s.   Lab Results  Component Value Date   HGBA1C 7.5 (A) 12/24/2019   HGBA1C 9.1 (H) 08/20/2019   HGBA1C 9.1 08/20/2019   Lab Results  Component Value Date   MICROALBUR 0.50 05/07/2013   LDLCALC 51 10/18/2018   CREATININE 0.93 08/20/2019   Not eating junk food, drinking lots water.  Trying to eat healthy. Not eating much fried food. Saw diabetic educator.  Taking 644m bid gabapentin for neuropathy for pain. Seeing podiatry for injection, Dr. KJens Som Getting b12 injection with podiatry.  Sinus issues-  Hard to breathe, one side of nose closing up and lots of congestion in throat at night, waking up at night. Using zyrtec in AM and flonase.  Feels he has several sinus infections per year and wanting to see specialist.  -requesting referral to ENT for recurrent sinus infections. Last sinus infection documented in chart was 1/21.   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   Outpatient Encounter Medications as of 02/23/2020  Medication Sig  . albuterol (VENTOLIN HFA) 108 (90 Base) MCG/ACT inhaler INHALE 2 PUFFS INTO LUNGS EVERY 6 HOURS AS NEEDED FOR WHEEZING AND SHORTNESS OF BREATH.  .Marland KitchenALPRAZolam (XANAX) 0.5 MG tablet Take 1 tablet (0.5 mg total) by mouth 3 (three) times daily as needed for sleep or anxiety.  .Marland Kitchenaspirin (ASPIR-LOW) 81 MG EC tablet Take 1 tablet (81 mg total) by mouth daily.  .Marland Kitchenatorvastatin (LIPITOR) 40 MG tablet TAKE (1) TABLET BY MOUTH ONCE DAILY. (Patient taking differently: Take 40 mg by mouth daily. )  . B-D UF III MINI PEN NEEDLES 31G X 5 MM MISC USE UP TO 4 TIMES DAILY WITH INSULIN AS DIRECTED.  .Marland Kitchencetirizine (ZYRTEC) 10 MG tablet Take 1 tablet (10 mg total) by mouth daily.  . Cholecalciferol (VITAMIN D3) 125 MCG (5000 UT) CAPS Take 1 capsule (5,000 Units total) by mouth daily.  . Continuous Blood Gluc Sensor (FREESTYLE LIBRE 14 DAY SENSOR) MISC Inject 1 each into the skin every 14 (fourteen) days. Use as directed.  . diltiazem (CARDIZEM) 30 MG tablet TAKE (1) TABLET BY MOUTH TWICE DAILY.  . fluticasone (FLONASE) 50 MCG/ACT nasal spray Place 2 sprays into both nostrils daily.  . fluticasone-salmeterol (ADVAIR HFA) 115-21 MCG/ACT inhaler Inhale 2 puffs into the lungs 2 (two) times daily.  .Marland Kitchengabapentin (NEURONTIN) 300 MG capsule Take 2 capsules (600 mg total) by mouth 2 (two) times daily.  .Marland Kitchen  glipiZIDE (GLUCOTROL XL) 5 MG 24 hr tablet TAKE (2) TABLETS BY MOUTH ONCE DAILY WITH BREAKFAST. (Patient taking differently: Take 10 mg by mouth daily with breakfast. )  . glucose blood (CONTOUR NEXT TEST) test strip 1 each by Other route 4 (four) times daily. Use as instructed  . HUMALOG KWIKPEN 100 UNIT/ML KwikPen Inject 15-21 Units into the skin 3 (three) times daily.  . Lancets MISC 1 each by Does not apply route 4 (four) times daily.  Marland Kitchen  lidocaine (LIDODERM) 5 % Place 1 patch onto the skin daily. Place 1 patch to your are of most significant pain on your neck once per day as needed. Remove & Discard patch within 12 hours  . losartan (COZAAR) 25 MG tablet TAKE (1) TABLET BY MOUTH ONCE DAILY. (Patient taking differently: Take 25 mg by mouth daily. )  . metFORMIN (GLUCOPHAGE) 500 MG tablet TAKE 2 TABLETS BY MOUTH TWICE DAILY WITH A MEAL. (Patient taking differently: Take 1,000 mg by mouth 2 (two) times daily with a meal. )  . nitroGLYCERIN (NITROSTAT) 0.4 MG SL tablet Place 1 tablet (0.4 mg total) under the tongue every 5 (five) minutes as needed.  . pantoprazole (PROTONIX) 40 MG tablet TAKE (1) TABLET BY MOUTH ONCE DAILY.  . ranolazine (RANEXA) 1000 MG SR tablet TAKE (1) TABLET BY MOUTH TWICE DAILY. (Patient taking differently: Take 1,000 mg by mouth 2 (two) times daily. )  . sertraline (ZOLOFT) 100 MG tablet Take 1.5 tablets (150 mg total) by mouth daily.  . TOUJEO MAX SOLOSTAR 300 UNIT/ML Solostar Pen INJECT 130 UNITS INTO THE SKIN AT BEDTIME.  . traZODone (DESYREL) 50 MG tablet Take 1 tablet (50 mg total) by mouth at bedtime.  Marland Kitchen VASCEPA 1 g capsule TAKE 2 CAPSULES BY MOUTH 2 TIMES DAILY.  . [DISCONTINUED] gabapentin (NEURONTIN) 300 MG capsule Take 1 capsule (300 mg total) by mouth 4 (four) times daily.   No facility-administered encounter medications on file as of 02/23/2020.     Review of Systems  Constitutional: Negative for chills and fever.  HENT: Positive for postnasal drip. Negative for congestion, rhinorrhea and sore throat.   Respiratory: Positive for cough. Negative for shortness of breath and wheezing.   Cardiovascular: Negative for chest pain and leg swelling.  Gastrointestinal: Negative for abdominal pain, diarrhea, nausea and vomiting.  Endocrine: Negative for polydipsia, polyphagia and polyuria.  Genitourinary: Negative for dysuria and frequency.  Skin: Negative for rash.  Neurological: Negative for dizziness,  weakness and headaches.     Vitals BP 118/80   Pulse 99   Temp (!) 97.5 F (36.4 C)   Ht 6' 2.5" (1.892 m)   Wt 272 lb (123.4 kg)   SpO2 99%   BMI 34.46 kg/m   Objective:   Physical Exam   Assessment and Plan   1. DM type 2 causing vascular disease ([place]) - Ambulatory referral to Endocrinology  2. Recurrent sinusitis - Ambulatory referral to ENT  3. Post-nasal drip - Ambulatory referral to ENT   Dm2- improved,stable, last a1c 7.5. H/o diabetic neuropathy, taking gabapentin differently, taking 660m bid, instead of 3027mqid. Cont meds for diabetes.  Watch diet and increase in exercising as tolerated. Continue BG check 4x per day.  H/o sinusitis/Post nasal drip- continue with flonase and zyrtec daily. Pt requesting referral to ENT.   F/u prn.

## 2020-02-24 ENCOUNTER — Telehealth (HOSPITAL_COMMUNITY): Payer: Self-pay | Admitting: Psychiatry

## 2020-02-24 NOTE — Telephone Encounter (Signed)
Called pt to schedule F/U left VM

## 2020-03-04 ENCOUNTER — Encounter: Payer: Self-pay | Admitting: Family Medicine

## 2020-03-08 ENCOUNTER — Other Ambulatory Visit: Payer: Self-pay | Admitting: Family Medicine

## 2020-03-08 ENCOUNTER — Other Ambulatory Visit: Payer: Self-pay | Admitting: "Endocrinology

## 2020-04-11 ENCOUNTER — Other Ambulatory Visit: Payer: Self-pay | Admitting: "Endocrinology

## 2020-04-18 ENCOUNTER — Telehealth: Payer: Self-pay | Admitting: Family Medicine

## 2020-04-18 NOTE — Telephone Encounter (Signed)
Pt is currently scheduled for 05/25/2020 @ 9:30am (next available OV appt) for Bad HA's x1 year & arms shaking off & on - requested a referral to neurology   States he was told he should be checked for Leverne Humbles disease   Can we work him in sooner in a same day appt slot?  Please advise & call pt

## 2020-04-19 NOTE — Telephone Encounter (Signed)
This patient may be given an appointment with Dr. Lovena Le next week for further evaluation if she does not have any available same days please let me know

## 2020-04-20 NOTE — Telephone Encounter (Signed)
Erica scheduled pt for 8/18 (pt unable to come next week due to a planned vacation)

## 2020-04-26 ENCOUNTER — Ambulatory Visit: Payer: PRIVATE HEALTH INSURANCE | Admitting: "Endocrinology

## 2020-04-27 ENCOUNTER — Ambulatory Visit: Payer: PRIVATE HEALTH INSURANCE | Admitting: Family Medicine

## 2020-04-30 ENCOUNTER — Other Ambulatory Visit: Payer: Self-pay | Admitting: "Endocrinology

## 2020-05-02 ENCOUNTER — Telehealth (INDEPENDENT_AMBULATORY_CARE_PROVIDER_SITE_OTHER): Payer: PRIVATE HEALTH INSURANCE | Admitting: Psychiatry

## 2020-05-02 ENCOUNTER — Encounter (HOSPITAL_COMMUNITY): Payer: Self-pay | Admitting: Psychiatry

## 2020-05-02 ENCOUNTER — Other Ambulatory Visit: Payer: Self-pay

## 2020-05-02 DIAGNOSIS — F321 Major depressive disorder, single episode, moderate: Secondary | ICD-10-CM

## 2020-05-02 MED ORDER — SERTRALINE HCL 100 MG PO TABS: 150.0000 mg | ORAL_TABLET | Freq: Every day | ORAL | 2 refills | Status: DC

## 2020-05-02 MED ORDER — ALPRAZOLAM 0.5 MG PO TABS: 0.5000 mg | ORAL_TABLET | Freq: Three times a day (TID) | ORAL | 2 refills | Status: DC | PRN

## 2020-05-02 NOTE — Progress Notes (Signed)
Virtual Visit via Video Note  I connected with Jared Griffin on 05/02/20 at 10:20 AM EDT by a video enabled telemedicine application and verified that I am speaking with the correct person using two identifiers.   I discussed the limitations of evaluation and management by telemedicine and the availability of in person appointments. The patient expressed understanding and agreed to proceed.    I discussed the assessment and treatment plan with the patient. The patient was provided an opportunity to ask questions and all were answered. The patient agreed with the plan and demonstrated an understanding of the instructions.   The patient was advised to call back or seek an in-person evaluation if the symptoms worsen or if the condition fails to improve as anticipated.  I provided 15 minutes of non-face-to-face time during this encounter. Location: Provider office, patient home  [name], MD  [facility] MD/PA/NP OP Progress Note  05/02/2020 10:29 AM Jared Griffin  MRN:  [id number]  Chief Complaint:  Chief Complaint    Depression; Anxiety; Follow-up     HPI: This patient is a 60 year old married white male lives with his wife and  son in [place]. 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 zonebut had an arm injury at work and had to quit there as well.  The patient was referred by his primary physician, Dr. [name], for further assessment and treatment of anxiety and depression.  The patient states that he is become increasingly depressed and anxious over the last couple of years due to work stress. He stated that he was always under pressure to get more things done than he had time to do. 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 He got to the point of hating going to work.  Several years ago he had cardiac problems and had a stent placed. In January of this year he began developing dizzy spells chest pain headaches stomach aches. It got so bad that he was readmitted for cardiac catheterization but nothing new was found.  The patient was having difficulty sleeping constant worry and anxiety about his job sadness and anger. He was getting irritable with people at home. After talking to his primary care physician at length and decided to take him out on medical retirement. The patient has been on Wellbutrin SR 150 mg twice a day for several weeks now. He had also been out in the past when his wife was sick. He has not seen any benefit from this but does feel tremendously better since he stopped working. He is also on Xanax 0.5 mg 3 times a day which he thinks has helped to some degree.  Since stopping working he's had a big turnaround. His sleep and energy have improved. 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 He is much less anxious and is no longer having the chest pain dizzy spells or stomachaches. He denies suicidal ideation or psychotic symptoms. He does not use drugs or alcohol  The patient returns for follow-up after 3 months.  He states for the most part he is doing okay.  He still has significant problems with lower back pain and has had several injections.  The last one worked a lot better and he has had some relief from the back  pain.  He was also awarded permanent Social Security disability which is relieved some of his stress regarding finances.  He states that his mood has been stable and he denies serious depression or anxiety or thoughts of self-harm or suicide.  He is sleeping fairly well with the trazodone.  He mostly stays in however he does not get out much because of his back pain and difficulty breathing. Visit Diagnosis:    ICD-10-CM   1. Moderate single current episode of major depressive disorder ([employer])  F32.1     Past  Psychiatric History: none  Past Medical History:  Past Medical History:  Diagnosis Date  . Anginal pain ([place])   . Anxiety   . 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 COPD (chronic obstructive pulmonary disease) ([place])    very close to being diagnosed  . Coronary artery disease   . Coronary atherosclerosis of native coronary artery    a. 12/09/2013: s/p PCI with 3.0 x 28 Promus DES to mLAD  . Cyst (solitary) of breast 10/03/2017   removed from back  . Depression   . Dyspnea    take inhaler  . Elbow injury 07/25/2017   surgery for torn tendon  . Essential hypertension, benign   . Fatty liver disease, nonalcoholic   . Lateral epicondylitis of right elbow   . Mixed hyperlipidemia 2006  . Obesity   . Osgood-Schlatter's disease of right knee   . Type 2 diabetes mellitus ([place])     Past Surgical History:  Procedure Laterality Date  . BIOPSY  01/21/2019   Procedure: BIOPSY;  Surgeon: [name], MD;  [facility];  Service: Endoscopy;;  gastric bx and gastric polyps  . CARDIAC CATHETERIZATION  2011  . CARDIAC CATHETERIZATION N/A 09/20/2015   Procedure: Left Heart Cath and Coronary Angiography;  Surgeon: [name], MD;  Location: [place] [facility];  Service: Cardiovascular;  Laterality: N/A;  . CHOLECYSTECTOMY  2003  . COLONOSCOPY  06/19/2012   Procedure: COLONOSCOPY;  Surgeon: [name], MD;  [facility];  Service: Endoscopy;  Laterality: N/A;  930  . COLONOSCOPY N/A 10/17/2017   Procedure: COLONOSCOPY;  Surgeon: [name], MD;  [facility];  Service: Endoscopy;  Laterality: N/A;  1225  . CYST EXCISION  07/2019  . ESOPHAGOGASTRODUODENOSCOPY (EGD) WITH PROPOFOL N/A 01/21/2019   Procedure: ESOPHAGOGASTRODUODENOSCOPY (EGD) WITH PROPOFOL;  Surgeon: [name], MD;  [facility];  Service: Endoscopy;  Laterality: N/A;  10:15am  . KNEE ARTHROPLASTY Right 1999  . LEFT HEART CATH AND CORONARY  ANGIOGRAPHY N/A 02/26/2018   Procedure: LEFT HEART CATH AND CORONARY ANGIOGRAPHY;  Surgeon: [name], MD;  Location: [place] [facility];  Service: Cardiovascular;  Laterality: N/A;  . LEFT HEART CATHETERIZATION WITH CORONARY ANGIOGRAM N/A 12/09/2013   Procedure: LEFT HEART CATHETERIZATION WITH CORONARY ANGIOGRAM;  Surgeon: [name], MD;  Location: [facility] CATH LAB;  Service: Cardiovascular;  Laterality: N/A;  . Liver biopsy    . POLYPECTOMY  10/17/2017   Procedure: POLYPECTOMY;  Surgeon: [name], MD;  [facility];  Service: Endoscopy;;  ascending colon, splenic flexure,sigmoid x2  . TENNIS ELBOW RELEASE/NIRSCHEL PROCEDURE Right 07/25/2017   Procedure: TENNIS ELBOW RELEASE and debriedment;  Surgeon: [name], MD;  Location: [facility];  Service: Orthopedics;  Laterality: Right;  . TONSILLECTOMY  1970's    Family Psychiatric History: see below  Family History:  Family  History  Problem Relation Age of Onset  . Cancer Maternal Aunt   . CVA Maternal Grandmother   . CVA Maternal Grandfather     Social History:  Social History   Socioeconomic History  . Marital status: Married    Spouse name: Not on file  . Number of children: Not on file  . Years of education: Not on file  . Highest education level: Not on file  Occupational History  . Not on file  Tobacco Use  . Smoking status: Former Smoker    Packs/day: 0.50    Years: 10.00    Pack years: 5.00    Types: Cigarettes    Start date: 02/02/1973    Quit date: 09/17/1983    Years since quitting: 36.6  . Smokeless tobacco: Former Systems developer    Types: [place] date: 09/17/1983  Vaping Use  . Vaping Use: Never used  Substance and Sexual Activity  . Alcohol use: No    Alcohol/week: 0.0 standard drinks  . Drug use: No  . Sexual activity: Yes  Other Topics Concern  . 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    Married with 1 child   Right handed    12 th    Very little caffeine   Social Determinants of Radio broadcast assistant Strain:   . Difficulty of Paying Living Expenses:   Food Insecurity:   . Worried About Charity fundraiser in the Last Year:   . Arboriculturist in the Last Year:   Transportation Needs:   . Film/video editor (Medical):   Marland Kitchen Lack of Transportation (Non-Medical):   Physical Activity:   . Days of Exercise per Week:   . Minutes of Exercise per Session:   Stress:   . Feeling of Stress :   Social Connections:   . Frequency of Communication with Friends and Family:   . Frequency of Social Gatherings with Friends and Family:   . Attends Religious Services:   . Active Member of Clubs or Organizations:   . Attends Archivist Meetings:   Marland Kitchen Marital Status:     Allergies:  Allergies  Allergen Reactions  . Metoprolol Rash    Metabolic Disorder Labs: Lab Results  Component Value Date   HGBA1C 7.5 (A) 12/24/2019   MPG 211.6 07/12/2018   MPG 197.25 07/23/2017   No results found for: PROLACTIN Lab Results  Component Value Date   CHOL 116 10/18/2018   TRIG 173 (H) 10/18/2018   HDL 30 (L) 10/18/2018   CHOLHDL 3.9 10/18/2018   VLDL UNABLE TO CALCULATE IF TRIGLYCERIDE OVER 400 mg/dL 07/12/2018   LDLCALC 51 10/18/2018   LDLCALC UNABLE TO CALCULATE IF TRIGLYCERIDE OVER 400 mg/dL 07/12/2018   Lab Results  Component Value Date   TSH 0.648 08/20/2019   TSH 0.65 08/20/2019    Therapeutic Level Labs: No results found for: LITHIUM No results found for: VALPROATE No components found for:  CBMZ  Current Medications: Current Outpatient Medications  Medication Sig Dispense Refill  . albuterol (VENTOLIN HFA) 108 (90 Base) MCG/ACT inhaler INHALE 2 PUFFS INTO LUNGS EVERY 6 HOURS AS NEEDED FOR WHEEZING AND SHORTNESS OF BREATH. 8.5 g 5  . ALPRAZolam (XANAX) 0.5 MG tablet Take 1 tablet (0.5 mg total) by mouth 3 (three) times daily as needed  for sleep or anxiety. 90 tablet 2  .  aspirin (ASPIR-LOW) 81 MG EC tablet Take 1 tablet (81 mg total) by mouth daily. 30 tablet 12  . atorvastatin (LIPITOR) 40 MG tablet TAKE (1) TABLET BY MOUTH ONCE DAILY. (Patient taking differently: Take 40 mg by mouth daily. ) 90 tablet 0  . B-D UF III MINI PEN NEEDLES 31G X 5 MM MISC USE UP TO 4 TIMES DAILY WITH INSULIN AS DIRECTED. 150 each 5  . cetirizine (ZYRTEC) 10 MG tablet Take 1 tablet (10 mg total) by mouth daily. 30 tablet 0  . Cholecalciferol (VITAMIN D3) 125 MCG (5000 UT) CAPS Take 1 capsule (5,000 Units total) by mouth daily. 90 capsule 0  . Continuous Blood Gluc Sensor (FREESTYLE LIBRE 14 DAY SENSOR) MISC Inject 1 each into the skin every 14 (fourteen) days. Use as directed. 2 each 2  . diltiazem (CARDIZEM) 30 MG tablet TAKE (1) TABLET BY MOUTH TWICE DAILY. 180 tablet 0  . fluticasone (FLONASE) 50 MCG/ACT nasal spray Place 2 sprays into both nostrils daily. 16 g 0  . fluticasone-salmeterol (ADVAIR HFA) 115-21 MCG/ACT inhaler Inhale 2 puffs into the lungs 2 (two) times daily. 1 Inhaler 5  . gabapentin (NEURONTIN) 300 MG capsule Take 2 capsules (600 mg total) by mouth 2 (two) times daily. 120 capsule 5  . glipiZIDE (GLUCOTROL XL) 5 MG 24 hr tablet TAKE (2) TABLETS BY MOUTH ONCE DAILY WITH BREAKFAST. (Patient taking differently: Take 10 mg by mouth daily with breakfast. ) 180 tablet 0  . glucose blood (CONTOUR NEXT TEST) test strip 1 each by Other route 4 (four) times daily. Use as instructed 150 each 5  . HUMALOG KWIKPEN 100 UNIT/ML KwikPen Inject 15-21 Units into the skin 3 (three) times daily.    . Lancets MISC 1 each by Does not apply route 4 (four) times daily. 150 each 5  . lidocaine (LIDODERM) 5 % Place 1 patch onto the skin daily. Place 1 patch to your are of most significant pain on your neck once per day as needed. Remove & Discard patch within 12 hours 30 patch 0  . losartan (COZAAR) 25 MG tablet TAKE (1) TABLET BY MOUTH ONCE DAILY. (Patient  taking differently: Take 25 mg by mouth daily. ) 90 tablet 2  . metFORMIN (GLUCOPHAGE) 500 MG tablet TAKE 2 TABLETS BY MOUTH TWICE DAILY WITH A MEAL. (Patient taking differently: Take 1,000 mg by mouth 2 (two) times daily with a meal. ) 360 tablet 0  . nitroGLYCERIN (NITROSTAT) 0.4 MG SL tablet Place 1 tablet (0.4 mg total) under the tongue every 5 (five) minutes as needed. 25 tablet 3  . pantoprazole (PROTONIX) 40 MG tablet TAKE (1) TABLET BY MOUTH ONCE DAILY. 90 tablet 0  . ranolazine (RANEXA) 1000 MG SR tablet TAKE (1) TABLET BY MOUTH TWICE DAILY. (Patient taking differently: Take 1,000 mg by mouth 2 (two) times daily. ) 60 tablet 11  . sertraline (ZOLOFT) 100 MG tablet Take 1.5 tablets (150 mg total) by mouth daily. 135 tablet 2  . TOUJEO MAX SOLOSTAR 300 UNIT/ML Solostar Pen INJECT 130 UNITS INTO THE SKIN AT BEDTIME. 12 mL 0  . traZODone (DESYREL) 50 MG tablet Take 1 tablet (50 mg total) by mouth at bedtime. 30 tablet 2  . VASCEPA 1 g capsule TAKE 2 CAPSULES BY MOUTH 2 TIMES DAILY. 120 capsule 6   No current facility-administered medications for this visit.     Musculoskeletal: Strength & Muscle Tone: within normal limits Gait & Station: normal Patient leans: N/A  Psychiatric Specialty Exam:  Review of Systems  Respiratory: Positive for shortness of breath.   Musculoskeletal: Positive for arthralgias and back pain.  All other systems reviewed and are negative.   There were no vitals taken for this visit.There is no height or weight on file to calculate BMI.  General Appearance: Casual and Fairly Groomed  Eye Contact:  Good  Speech:  Clear and Coherent  Volume:  Normal  Mood:  Euthymic  Affect:  Appropriate and Congruent  Thought Process:  Goal Directed  Orientation:  Full (Time, Place, and Person)  Thought Content: WDL   Suicidal Thoughts:  No  Homicidal Thoughts:  No  Memory:  Immediate;   Good Recent;   Good Remote;   Good  Judgement:  Good  Insight:  Good  Psychomotor  Activity:  Decreased  Concentration:  Concentration: Good and Attention Span: Good  Recall:  Good  Fund of Knowledge: Good  Language: Good  Akathisia:  No  Handed:  Right  AIMS (if indicated): not done  Assets:  Communication Skills Desire for Improvement Resilience Social Support Talents/Skills  ADL's:  Intact  Cognition: WNL  Sleep:  Fair   Screenings: GAD-7     Office Visit from 08/05/2018 in [place]  Total GAD-7 Score 5    PHQ2-9     Office Visit from 12/09/2019 in [place] from 08/06/2018 in [facility] Office Visit from 08/05/2018 in [place] Visit from 07/24/2018 in [facility] Office Visit from 12/02/2017 in [place]  PHQ-2 Total Score 5 0 1 0 0  PHQ-9 Total Score 11 -- 3 -- 1       Assessment and Plan: This patient is a 59 year old male with a history of depression anxiety as well as chronic pain.  He continues to do well on his current regimen.  He will continue Zoloft 150 mg daily for depression, trazodone 50 mg at bedtime as needed for sleep and Xanax 0.5 mg 3 times daily as needed for anxiety.  He will return to see me in 3 months   [name], MD 05/02/2020, 10:29 AM

## 2020-05-03 ENCOUNTER — Ambulatory Visit (INDEPENDENT_AMBULATORY_CARE_PROVIDER_SITE_OTHER): Payer: PRIVATE HEALTH INSURANCE | Admitting: Internal Medicine

## 2020-05-04 ENCOUNTER — Ambulatory Visit: Payer: PRIVATE HEALTH INSURANCE | Admitting: Family Medicine

## 2020-05-06 ENCOUNTER — Encounter: Payer: Self-pay | Admitting: Family Medicine

## 2020-05-06 ENCOUNTER — Other Ambulatory Visit: Payer: Self-pay

## 2020-05-06 ENCOUNTER — Ambulatory Visit (INDEPENDENT_AMBULATORY_CARE_PROVIDER_SITE_OTHER): Payer: Medicare Other | Admitting: Family Medicine

## 2020-05-06 VITALS — BP 124/80 | HR 116 | Temp 97.9°F | Ht 74.5 in | Wt 268.0 lb

## 2020-05-06 DIAGNOSIS — M5481 Occipital neuralgia: Secondary | ICD-10-CM | POA: Insufficient documentation

## 2020-05-06 DIAGNOSIS — R251 Tremor, unspecified: Secondary | ICD-10-CM | POA: Insufficient documentation

## 2020-05-06 DIAGNOSIS — R29898 Other symptoms and signs involving the musculoskeletal system: Secondary | ICD-10-CM

## 2020-05-06 DIAGNOSIS — M542 Cervicalgia: Secondary | ICD-10-CM | POA: Diagnosis not present

## 2020-05-06 MED ORDER — TIZANIDINE HCL 4 MG PO CAPS: 4.0000 mg | ORAL_CAPSULE | Freq: Three times a day (TID) | ORAL | 0 refills | Status: DC | PRN

## 2020-05-06 NOTE — Progress Notes (Signed)
Patient ID: Jared Griffin, male    DOB: 1961/05/21, 59 y.o.   MRN: [id number]   Chief Complaint  Patient presents with  . Headache   Subjective:    HPI headaches for over one year. Pain is in the back of the head. Saw ENT and was told it was related to sinus.  Pt thought went to ENT thinking was related to sinuses. Pain in on occiput and work down then to top of head.  Admitted 2 yrs ago to hospital.  Then in 5/21- thought might be migraines. Radiate down to base of neck. Like sharp knife feeling. 2 wks nothing, then week with pain daily.   Right arm tremors. Had arm injury about 3 years ago. Saw neurology and had a nerve test and was told it was not due to injury and was told to be checked for parkinson's. Now started to have tremors in left arm.   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  Shaking in rt arm.  Hard to write and had nerve conduction study 29yrago, seen by Dr. FLaurence Spates ortho.  2nd opinion on rt arm with tremor, from Dr. WBurney Gauze ortho.  Said need work up for [employer] They said may need to be checked for parkinson's. Feels hard to write. Getting weak in arm when eating with rt hand, has to switching. Walking is normal. Rt handed.  Has to hold hand tight to get to write on paper. Some times have to reach out and touch the wall, when walking.    Medical History Jared Griffin a past medical history of [place] ([place], Anxiety, Cirrhosis of liver without mention of alcohol (2003), COPD (chronic obstructive pulmonary disease) ([place], Coronary artery disease, Coronary atherosclerosis of native coronary artery, Cyst (solitary) of breast (10/03/2017), Depression, Diabetic neuropathy ([place], Dyspnea, Elbow injury (07/25/2017), Essential hypertension, benign, Fatty liver disease, nonalcoholic, Lateral epicondylitis of right elbow, Mixed hyperlipidemia (2006), Obesity, Osgood-Schlatter's disease of right knee, and Type 2 diabetes  mellitus ([place].   Outpatient Encounter Medications as of 05/06/2020  Medication Sig  . albuterol (VENTOLIN HFA) 108 (90 Base) MCG/ACT inhaler INHALE 2 PUFFS INTO LUNGS EVERY 6 HOURS AS NEEDED FOR WHEEZING AND SHORTNESS OF BREATH.  .Marland KitchenALPRAZolam (XANAX) 0.5 MG tablet Take 1 tablet (0.5 mg total) by mouth 3 (three) times daily as needed for sleep or anxiety.  .Marland Kitchenaspirin (ASPIR-LOW) 81 MG EC tablet Take 1 tablet (81 mg total) by mouth daily.  .Marland Kitchenatorvastatin (LIPITOR) 40 MG tablet TAKE (1) TABLET BY MOUTH ONCE DAILY. (Patient taking differently: Take 40 mg by mouth daily. )  . B-D UF III MINI PEN NEEDLES 31G X 5 MM MISC USE UP TO 4 TIMES DAILY WITH INSULIN AS DIRECTED.  .Marland Kitchencetirizine (ZYRTEC) 10 MG tablet Take 1 tablet (10 mg total) by mouth daily.  . Cholecalciferol (VITAMIN D3) 125 MCG (5000 UT) CAPS Take 1 capsule (5,000 Units total) by mouth daily.  . Continuous Blood Gluc Sensor (FREESTYLE LIBRE 14 DAY SENSOR) MISC Inject 1 each into the skin every 14 (fourteen) days. Use as directed.  . diltiazem (CARDIZEM) 30 MG tablet TAKE (1) TABLET BY MOUTH TWICE DAILY.  . fluticasone (FLONASE) 50 MCG/ACT nasal spray Place 2 sprays into both nostrils daily.  . fluticasone-salmeterol (ADVAIR HFA) 115-21 MCG/ACT inhaler Inhale 2 puffs into the lungs 2 (two) times daily.  .Marland Kitchengabapentin (NEURONTIN) 300 MG capsule Take 2 capsules (600 mg total) by mouth 2 (two) times daily.  .Marland Kitchen  glucose blood (CONTOUR NEXT TEST) test strip 1 each by Other route 4 (four) times daily. Use as instructed  . HUMALOG KWIKPEN 100 UNIT/ML KwikPen Inject 25-40 Units into the skin 3 (three) times daily.   . Lancets MISC 1 each by Does not apply route 4 (four) times daily.  Marland Kitchen lidocaine (LIDODERM) 5 % Place 1 patch onto the skin daily. Place 1 patch to your are of most significant pain on your neck once per day as needed. Remove & Discard patch within 12 hours  . losartan (COZAAR) 25 MG tablet TAKE (1) TABLET BY MOUTH ONCE DAILY. (Patient taking  differently: Take 25 mg by mouth daily. )  . metFORMIN (GLUCOPHAGE) 500 MG tablet TAKE 2 TABLETS BY MOUTH TWICE DAILY WITH A MEAL.  . nitroGLYCERIN (NITROSTAT) 0.4 MG SL tablet Place 1 tablet (0.4 mg total) under the tongue every 5 (five) minutes as needed. (Patient not taking: Reported on 05/12/2020)  . pantoprazole (PROTONIX) 40 MG tablet TAKE (1) TABLET BY MOUTH ONCE DAILY.  . ranolazine (RANEXA) 1000 MG SR tablet TAKE (1) TABLET BY MOUTH TWICE DAILY. (Patient taking differently: Take 1,000 mg by mouth 2 (two) times daily. )  . sertraline (ZOLOFT) 100 MG tablet Take 1.5 tablets (150 mg total) by mouth daily.  . TOUJEO MAX SOLOSTAR 300 UNIT/ML Solostar Pen INJECT 130 UNITS INTO THE SKIN AT BEDTIME. (Patient taking differently: Inject 80 Units into the skin at bedtime. )  . traZODone (DESYREL) 50 MG tablet Take 1 tablet (50 mg total) by mouth at bedtime. (Patient taking differently: Take 50 mg by mouth at bedtime as needed. )  . [DISCONTINUED] VASCEPA 1 g capsule TAKE 2 CAPSULES BY MOUTH 2 TIMES DAILY.  Marland Kitchen tiZANidine (ZANAFLEX) 4 MG capsule Take 1 capsule (4 mg total) by mouth 3 (three) times daily as needed for muscle spasms.  . [DISCONTINUED] glipiZIDE (GLUCOTROL XL) 5 MG 24 hr tablet TAKE (2) TABLETS BY MOUTH ONCE DAILY WITH BREAKFAST. (Patient taking differently: Take 10 mg by mouth daily with breakfast. )   No facility-administered encounter medications on file as of 05/06/2020.     Review of Systems  Constitutional: Negative for chills and fever.  HENT: Negative for congestion, rhinorrhea and sore throat.   Respiratory: Negative for cough, shortness of breath and wheezing.   Cardiovascular: Negative for chest pain and leg swelling.  Gastrointestinal: Negative for abdominal pain, diarrhea, nausea and vomiting.  Genitourinary: Negative for dysuria and frequency.  Skin: Negative for rash.  Neurological: Positive for headaches. Negative for dizziness and weakness.     Vitals BP 124/80    Pulse (!) 116   Temp 97.9 F (36.6 C)   Ht 6' 2.5" (1.892 m)   Wt 268 lb (121.6 kg)   SpO2 97%   BMI 33.95 kg/m   Objective:   Physical Exam Vitals and nursing note reviewed.  Constitutional:      General: He is not in acute distress.    Appearance: Normal appearance. He is not ill-appearing.  HENT:     Head: Normocephalic.     Nose: Nose normal. No congestion.     Mouth/Throat:     Mouth: Mucous membranes are moist.     Pharynx: No oropharyngeal exudate.  Eyes:     Extraocular Movements: Extraocular movements intact.     Conjunctiva/sclera: Conjunctivae normal.     Pupils: Pupils are equal, round, and reactive to light.  Cardiovascular:     Rate and Rhythm: Normal rate and regular rhythm.  Pulses: Normal pulses.     Heart sounds: Normal heart sounds. No murmur heard.   Pulmonary:     Effort: Pulmonary effort is normal.     Breath sounds: Normal breath sounds. No wheezing, rhonchi or rales.  Musculoskeletal:        General: Normal range of motion.     Right lower leg: No edema.     Left lower leg: No edema.     Comments: +cogwheel rigidity on the rt arm.  -negative on left arm for cogwheel rigidity.   Skin:    General: Skin is warm and dry.     Findings: No rash.  Neurological:     General: No focal deficit present.     Mental Status: He is alert and oriented to person, place, and time.     Cranial Nerves: No cranial nerve deficit.  Psychiatric:        Mood and Affect: Mood normal.        Behavior: Behavior normal.        Thought Content: Thought content normal.        Judgment: Judgment normal.      Assessment and Plan   1. Bilateral occipital neuralgia - Ambulatory referral to Neurology - DG Cervical Spine 2 or 3 views; Future  2. Neck pain - tiZANidine (ZANAFLEX) 4 MG capsule; Take 1 capsule (4 mg total) by mouth 3 (three) times daily as needed for muscle spasms.  Dispense: 21 capsule; Refill: 0 - DG Cervical Spine 2 or 3 views; Future  3.  Tremor of both hands - Ambulatory referral to Neurology  4. Cogwheel rigidity - Ambulatory referral to Neurology   F/u 89mo Or prn.

## 2020-05-09 ENCOUNTER — Ambulatory Visit (HOSPITAL_COMMUNITY)
Admission: RE | Admit: 2020-05-09 | Discharge: 2020-05-09 | Disposition: A | Discharge status: Home / Self Care | Payer: Medicare Other | Source: Ambulatory Visit | Attending: Family Medicine | Admitting: Family Medicine

## 2020-05-09 ENCOUNTER — Other Ambulatory Visit: Payer: Self-pay

## 2020-05-09 DIAGNOSIS — M542 Cervicalgia: Secondary | ICD-10-CM | POA: Diagnosis not present

## 2020-05-09 DIAGNOSIS — M2578 Osteophyte, vertebrae: Secondary | ICD-10-CM | POA: Diagnosis not present

## 2020-05-09 DIAGNOSIS — R531 Weakness: Secondary | ICD-10-CM | POA: Diagnosis not present

## 2020-05-09 DIAGNOSIS — I7090 Unspecified atherosclerosis: Secondary | ICD-10-CM | POA: Diagnosis not present

## 2020-05-09 DIAGNOSIS — M5481 Occipital neuralgia: Secondary | ICD-10-CM | POA: Diagnosis not present

## 2020-05-09 DIAGNOSIS — R251 Tremor, unspecified: Secondary | ICD-10-CM | POA: Diagnosis not present

## 2020-05-09 NOTE — Progress Notes (Signed)
Assessment/Plan:   1.  Tremor  -doesn't meet [place] brain bank or MDS criteria for Parkinsons Disease.  Some evidence of FND coexisting if not primary source.  He and I discussed role of stress  -we discussed DaT scan and will schedule.  Did discuss that sertraline can produce some false pos but low incidence and will keep him on it for it  2.  Headache  -Somewhat out of my area of expertise, which is movement disorders and discussed with him today  -we will do MRI cervical spine.  He requires open so we will do at [place]  -zanaflex started yesterday and discussed that I would take nightly as preventative medication  -if no help, can consider occipital nerve block IF insurance allows  -if above not helpful, We will send referral to Dr. Tommi Rumps  3.  F/u after above completed. Subjective:   Jared Griffin was seen today in the movement disorders clinic for neurologic consultation at the request of Erven Colla, DO.  The consultation is for the evaluation of tremor.  Outside records that were made available to me were reviewed.  Primary care/referring notes indicate that patient had a previous injury of the arm several years ago and tremor started after that.  Primary care records stated that he saw neurology and was told that he had a nerve injury and was told to be tested for Parkinson's.     I did review records from Dr. Janann Colonel from when he was seen back in 2015.  I do not see any records regarding tremor.  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  I also reviewed records from orthopedics from [place] in February, 2020.  Those notes indicate that the patient had sustained his work injury in February, 2018.  Those notes stated that he was working for [employer] and was pulling something off of the shelves and developed elbow pain from it.  He had steroid injections.  Orthopedic notes at that time  indicated "the patient has a resting tremor.  He also notes worsening of the tremor with intentional movements and particularly with forearm pronation."  The orthopedic surgeon did recommend that the patient see neurology for his tremor.  I do not see any evidence that the patient saw neurology after 2020.  He states that he didn't because of WC restrictions.    Tremor: Yes.     How long has it been going on? 3 years - started not long after injury and that is why he thought due to injury and has had 2 doctors tell him not from injury  At rest or with activation?  Either but will note it with eating.    Fam hx of tremor?  No. and no fam hx of Parkinsons Disease   Located where?  Started R arm 3 years ago; L arm 3 months ago.  Affected by caffeine:   (doesn't drink enough to know)  Affected by alcohol:  (doesn't drink any)  Affected by stress:  No.  Affected by fatigue:  No.  Spills soup if on spoon:  May or may not  Affects ADL's (tying shoes, brushing teeth, etc):  No.  Tremor inducing meds:  No.  Other Specific Symptoms:  Voice: no change Sleep: trouble getting and staying asleep - has sleep med but gives hangover effect so doesn't take much  Vivid Dreams:  Yes.  , some  Acting out dreams:  No. Wet Pillows: No. Postural symptoms:  Yes.   x 5 years  Falls?  Yes.   - 2 in the last years - last was 4 months ago - last was getting off of the couch and just fell down.   Bradykinesia symptoms: difficulty getting out of a chair and difficulty regaining balance (attributes to LBP and DDD) Loss of smell:  No. Loss of taste:  No. Urinary Incontinence:  No. Difficulty Swallowing:  No. Handwriting, micrographia: No. but trouble writing since injury to hand 5 years ago at work Depression:  Yes.   - medically retired x 5 years due to anxiety/stress which was manifested with postural sx's Memory changes:  Yes.   , mild - may forget to take meds Hallucinations:  No.  visual distortions: No. N/V:   No. Lightheaded:  Yes.    Syncope: No. Diplopia:  No. Dyskinesia:  No.   Separately, patient does have a history of headache.  He was actually in the emergency room in May because of headache.  Emergency room work-up was negative. Has had headaches x few years but are intermittent.  "hydrocodone is like eating candy and doesn't touch it."  Headache is central spine in the occiput and feels like "beating me with a hammer."  It radiates frontally.  It happens 3 times q 2 weeks.  No n/v.  He does know that how he sleeps/lays will trigger headache if not careful.  No other meds except tylenol.    Neuroimaging of the brain has previously been performed.  It is available for my review today.  I personally reviewed the patient's CT brain from May, 2021 as well as MRI brain from 2019.  MRI brain was completely normal.  CT head was likewise unremarkable with exception of some vascular calcifications.   ALLERGIES:   Allergies  Allergen Reactions  . Metoprolol Rash    CURRENT MEDICATIONS:  Current Outpatient Medications  Medication Instructions  . albuterol (VENTOLIN HFA) 108 (90 Base) MCG/ACT inhaler INHALE 2 PUFFS INTO LUNGS EVERY 6 HOURS AS NEEDED FOR WHEEZING AND SHORTNESS OF BREATH.  Marland Kitchen ALPRAZolam (XANAX) 0.5 mg, Oral, 3 times daily PRN  . aspirin (ASPIR-LOW) 81 mg, Oral, Daily  . atorvastatin (LIPITOR) 40 MG tablet TAKE (1) TABLET BY MOUTH ONCE DAILY.  Marland Kitchen B-D UF III MINI PEN NEEDLES 31G X 5 MM MISC USE UP TO 4 TIMES DAILY WITH INSULIN AS DIRECTED.  Marland Kitchen cetirizine (ZYRTEC) 10 mg, Oral, Daily  . Continuous Blood Gluc Sensor (FREESTYLE LIBRE 14 DAY SENSOR) MISC 1 each, Subcutaneous, Every 14 days, Use as directed.  . diltiazem (CARDIZEM) 30 MG tablet TAKE (1) TABLET BY MOUTH TWICE DAILY.  Marland Kitchen Exenatide ER (BYDUREON BCISE) 2 MG/0.85ML AUIJ 1 each, Subcutaneous, Weekly  . Farxiga 10 mg, Oral, Daily  . fluticasone (FLONASE) 50 MCG/ACT nasal spray 2 sprays, Each Nare, Daily  . fluticasone-salmeterol  (ADVAIR HFA) 115-21 MCG/ACT inhaler 2 puffs, Inhalation, 2 times daily  . gabapentin (NEURONTIN) 600 mg, Oral, 2 times daily  . glucose blood (CONTOUR NEXT TEST) test strip 1 each, Other, 4 times daily, Use as instructed   . HumaLOG KwikPen 25-40 Units, Subcutaneous, 3 times daily  . Lancets MISC 1 each, Does not apply, 4 times daily  . lidocaine (LIDODERM) 5 % 1 patch, Transdermal, Every 24 hours, Place 1 patch to your are of most significant pain on your neck once per day as needed. Remove & Discard patch within 12 hours  . losartan (COZAAR) 25 MG  tablet TAKE (1) TABLET BY MOUTH ONCE DAILY.  . metFORMIN (GLUCOPHAGE) 500 MG tablet TAKE 2 TABLETS BY MOUTH TWICE DAILY WITH A MEAL.  . nitroGLYCERIN (NITROSTAT) 0.4 mg, Sublingual, Every 5 min PRN  . pantoprazole (PROTONIX) 40 MG tablet TAKE (1) TABLET BY MOUTH ONCE DAILY.  . ranolazine (RANEXA) 1000 MG SR tablet TAKE (1) TABLET BY MOUTH TWICE DAILY.  Marland Kitchen sertraline (ZOLOFT) 150 mg, Oral, Daily  . tiZANidine (ZANAFLEX) 4 mg, Oral, 3 times daily PRN  . Toujeo Max SoloStar 130 Units, Subcutaneous, Daily at bedtime  . traZODone (DESYREL) 50 mg, Oral, Daily at bedtime  . Vitamin D3 5,000 Units, Oral, Daily    Objective:   PHYSICAL EXAMINATION:    VITALS:   Vitals:   05/10/20 1332  BP: 131/78  Pulse: (!) 103  SpO2: 96%  Weight: 268 lb (121.6 kg)  Height: 6' 2"  (1.88 m)    GEN:  The patient appears stated age and is in NAD. HEENT:  Normocephalic, atraumatic.  The mucous membranes are moist. The superficial temporal arteries are without ropiness or tenderness. CV:  Tachy.  regular Lungs:  CTAB Neck/HEME:  There are no carotid bruits bilaterally.  Neurological examination:  Orientation: The patient is alert and oriented x3.  Cranial nerves: There is good facial symmetry.  Extraocular muscles are intact. The visual fields are full to confrontational testing. The speech is fluent and clear. Soft palate rises symmetrically and there is no  tongue deviation. Hearing is intact to conversational tone. Sensation: Sensation is intact to light touch throughout (facial, trunk, extremities). Vibration is intact at the bilateral big toe. There is no extinction with double simultaneous stimulation.  Motor: Strength is 5/5 in the bilateral upper and lower extremities.   Shoulder shrug is equal and symmetric.  There is no pronator drift. Deep tendon reflexes: Deep tendon reflexes are 2/4 at the bilateral biceps, triceps, brachioradialis, patella and achilles. Plantar responses are downgoing bilaterally.  Movement examination: Tone: There is ? Increased tone in the RUE but there is some trouble relaxing when testing and once better relaxed, appears more normal Abnormal movements: there is Tremor in the RUE >LUE with evidence of entrainment.  No rest tremor even with distraction.   Coordination:  There is no decremation with RAM's, but he is somewhat slow and purposeful on the right with alternation of supination/pronation of the forearm Gait and Station: The patient has no difficulty arising out of a deep-seated chair without the use of the hands. The patient does not shuffle.   I have reviewed and interpreted the following labs independently   Chemistry      Component Value Date/Time   NA 139 08/20/2019 0937   K 4.1 08/20/2019 0937   CL 98 08/20/2019 0937   CO2 25 08/20/2019 0937   BUN 16 08/20/2019 0937   CREATININE 0.93 08/20/2019 0937   CREATININE 0.96 12/07/2013 0950      Component Value Date/Time   CALCIUM 9.6 08/20/2019 0937   ALKPHOS 52 08/20/2019 0937   AST 31 08/20/2019 0937   ALT 53 (H) 08/20/2019 0937   BILITOT 0.6 08/20/2019 0937      Lab Results  Component Value Date   TSH 0.648 08/20/2019   Lab Results  Component Value Date   WBC 7.5 01/05/2019   HGB 14.8 01/05/2019   HCT 43.7 01/05/2019   MCV 86.0 01/05/2019   PLT 151 01/05/2019      Total time spent on today's visit was  60 minutes,  including both  face-to-face time and nonface-to-face time.  Time included that spent on review of records (prior notes available to me/labs/imaging if pertinent), discussing treatment and goals, answering patient's questions and coordinating care.  Cc:  Erven Colla, DO

## 2020-05-10 ENCOUNTER — Encounter: Payer: Self-pay | Admitting: Neurology

## 2020-05-10 ENCOUNTER — Ambulatory Visit (INDEPENDENT_AMBULATORY_CARE_PROVIDER_SITE_OTHER): Payer: Medicare Other | Admitting: Neurology

## 2020-05-10 VITALS — BP 131/78 | HR 103 | Ht 74.0 in | Wt 268.0 lb

## 2020-05-10 DIAGNOSIS — M542 Cervicalgia: Secondary | ICD-10-CM

## 2020-05-10 DIAGNOSIS — R519 Headache, unspecified: Secondary | ICD-10-CM | POA: Diagnosis not present

## 2020-05-10 DIAGNOSIS — G4486 Cervicogenic headache: Secondary | ICD-10-CM

## 2020-05-10 DIAGNOSIS — R251 Tremor, unspecified: Secondary | ICD-10-CM

## 2020-05-10 NOTE — Patient Instructions (Signed)
1.  Take your tizanadine (zanaflex) every night at bedtime 2.  We will get your DaT scan and MRI cervical spine scheduled

## 2020-05-12 ENCOUNTER — Ambulatory Visit (INDEPENDENT_AMBULATORY_CARE_PROVIDER_SITE_OTHER): Payer: Medicare Other | Admitting: Gastroenterology

## 2020-05-12 ENCOUNTER — Encounter (INDEPENDENT_AMBULATORY_CARE_PROVIDER_SITE_OTHER): Payer: Self-pay | Admitting: Gastroenterology

## 2020-05-12 ENCOUNTER — Other Ambulatory Visit: Payer: Self-pay

## 2020-05-12 VITALS — BP 145/84 | HR 88 | Temp 97.9°F | Ht 74.0 in | Wt 269.5 lb

## 2020-05-12 DIAGNOSIS — R1013 Epigastric pain: Secondary | ICD-10-CM

## 2020-05-12 MED ORDER — SUCRALFATE 1 G PO TABS
1.0000 g | ORAL_TABLET | Freq: Two times a day (BID) | ORAL | 3 refills | Status: DC
Start: 2020-05-12 — End: 2020-11-08

## 2020-05-12 NOTE — Progress Notes (Addendum)
Patient profile: Jared Griffin is a 59 y.o. male seen for follow-up of NASH.  He was last seen August 2020.    History of Present Illness: Jared Griffin is seen today for epigastric pain.  He reports 3 nights ago he was in epigastric and lower chest that radiated all the way down his abdomen. Woke up around 1 AM and was not able to go back to sleep due to pain.  Described as a sharp stabbing discomfort.  Did not seem drastically worse when he ate the next day.  It is now 3/10 on pain scale w/ mild residual pain. 3 nights ago describes severe pain.  Infrequently has GERD symptoms on Protonix 40 mg once a day.  Chronic occasional nausea and uses Phenergan which helps but causes him to be drowsy.  No vomiting.  No dysphagia.  Does not find lower chest & epigastric pain drastically worsens with eating, moving around, etc.  Chronic bowels are loose likely related to diabetes medications, usually 3 to 4-day.  Denies any blood in stool, lower abdominal pain.  No bowel habit changes.  Intentionally trying to lose weight as below which has been difficult due to diabetes  Wt Readings from Last 3 Encounters:  05/12/20 269 lb 8 oz (122.2 kg)  05/10/20 268 lb (121.6 kg)  05/06/20 268 lb (121.6 kg)     Last Colonoscopy: 09/2017-- Perianal skin tags found on perianal exam. - Three 6 to 7 mm polyps in the sigmoid colon and in the ascending colon, removed with a hot snare. Resected and retrieved. - One 5 mm polyp at the splenic flexure, removed with a cold snare. Resected and retrieved. - 12 mm lipoma at hepatic flexure. - Internal hemorrhoids. PATH-Patient had 4 small polyps removed. There are tubular adenomas and some with high-grade dysplasia but no evidence of invasive carcinoma. Results given to patient. Next colonoscopy in 3 years  Last Endoscopy: 01/2019-- Normal esophagus. - Z-line irregular, 42 cm from the incisors. - Multiple gastric polyps. Biopsied. - Erythematous mucosa in  the gastric body. Biopsied. - Normal duodenal bulb and second portion of the duodenum.   Past Medical History:  Past Medical History:  Diagnosis Date   Anginal pain ([place])    Anxiety    Cirrhosis of liver without mention of alcohol 2003   COPD (chronic obstructive pulmonary disease) ([place])    very close to being diagnosed   Coronary artery disease    Coronary atherosclerosis of native coronary artery    a. 12/09/2013: s/p PCI with 3.0 x 28 Promus DES to mLAD   Cyst (solitary) of breast 10/03/2017   removed from back   Depression    Diabetic neuropathy ([place])    Dyspnea    take inhaler   Elbow injury 07/25/2017   surgery for torn tendon   Essential hypertension, benign    Fatty liver disease, nonalcoholic    Lateral epicondylitis of right elbow    Mixed hyperlipidemia 2006   Obesity    Osgood-Schlatter's disease of right knee    Type 2 diabetes mellitus ([place])     Problem List: Patient Active Problem List   Diagnosis Date Noted   Tremor of both hands 05/06/2020   Bilateral occipital neuralgia 05/06/2020   Abdominal pain, epigastric 01/13/2019   Anxiety 01/04/2019   Radicular pain of left lower extremity 12/22/2018   Moderate persistent asthma 10/25/2018   Dizziness 07/12/2018   Nonintractable headache    Abnormal nuclear stress test  Aftercare following surgery 07/25/17 08/13/2017   Hx of colonic polyps 08/07/2017   Lateral epicondylitis of right elbow    Cervical nerve root impingement 12/09/2015   Generalized anxiety disorder 09/27/2015   Chest pain 09/20/2015   Pain in the chest    Depression 01/24/2015   Esophageal reflux 01/24/2015   Preoperative cardiovascular examination 06/28/2014   DM type 2 causing vascular disease ([place])    Mixed hyperlipidemia    Hepatic cirrhosis ([place])    Fatty liver disease, nonalcoholic    Obesity    Intermediate coronary syndrome ([place]) 12/09/2013   Fatty liver 05/05/2013   Lumbago  05/21/2012   Essential hypertension, benign 12/28/2009   CAD S/P LAD DES March 2015 12/28/2009   Hyperlipidemia LDL goal <70 11/25/2009   Accelerating angina ([place]) 11/25/2009   DM 11/24/2009   ABDOMINAL PAIN, HX OF 11/24/2009    Past Surgical History: Past Surgical History:  Procedure Laterality Date   BIOPSY  01/21/2019   Procedure: BIOPSY;  Surgeon: [name], MD;  [facility];  Service: Endoscopy;;  gastric bx and gastric polyps   CARDIAC CATHETERIZATION  2011   CARDIAC CATHETERIZATION N/A 09/20/2015   Procedure: Left Heart Cath and Coronary Angiography;  Surgeon: [name], MD;  Location: [facility];  Service: Cardiovascular;  Laterality: N/A;   CHOLECYSTECTOMY  2003   COLONOSCOPY  06/19/2012   Procedure: COLONOSCOPY;  Surgeon: [name], MD;  [facility];  Service: Endoscopy;  Laterality: N/A;  930   COLONOSCOPY N/A 10/17/2017   Procedure: COLONOSCOPY;  Surgeon: [name], MD;  [facility];  Service: Endoscopy;  Laterality: N/A;  1225   CYST EXCISION  07/2019   ESOPHAGOGASTRODUODENOSCOPY (EGD) WITH PROPOFOL N/A 01/21/2019   Procedure: ESOPHAGOGASTRODUODENOSCOPY (EGD) WITH PROPOFOL;  Surgeon: [name], MD;  [facility];  Service: Endoscopy;  Laterality: N/A;  10:15am   KNEE ARTHROPLASTY Right 1999   LEFT HEART CATH AND CORONARY ANGIOGRAPHY N/A 02/26/2018   Procedure: LEFT HEART CATH AND CORONARY ANGIOGRAPHY;  Surgeon: [name], MD;  Location: [facility];  Service: Cardiovascular;  Laterality: N/A;   LEFT HEART CATHETERIZATION WITH CORONARY ANGIOGRAM N/A 12/09/2013   Procedure: LEFT HEART CATHETERIZATION WITH CORONARY ANGIOGRAM;  Surgeon: [name], MD;  Location: [facility];  Service: Cardiovascular;  Laterality: N/A;   Liver biopsy     POLYPECTOMY  10/17/2017   Procedure: POLYPECTOMY;  Surgeon: [name], MD;  [facility];   Service: Endoscopy;;  ascending colon, splenic flexure,sigmoid x2   TENNIS ELBOW RELEASE/NIRSCHEL PROCEDURE Right 07/25/2017   Procedure: TENNIS ELBOW RELEASE and debriedment;  Surgeon: [name], MD;  Location: [facility];  Service: Orthopedics;  Laterality: Right;   TONSILLECTOMY  1970's    Allergies: Allergies  Allergen Reactions   Metoprolol Rash      Home Medications:  Current Outpatient Medications:    albuterol (VENTOLIN HFA) 108 (90 Base) MCG/ACT inhaler, INHALE 2 PUFFS INTO LUNGS EVERY 6 HOURS AS NEEDED FOR WHEEZING AND SHORTNESS OF BREATH., Disp: 8.5 g, Rfl: 5   ALPRAZolam (XANAX) 0.5 MG tablet, Take 1 tablet (0.5 mg total) by mouth 3 (three) times daily as needed for sleep or anxiety., Disp: 90 tablet, Rfl: 2   aspirin (ASPIR-LOW) 81 MG EC tablet, Take 1 tablet (81 mg total) by mouth daily., Disp: 30 tablet, Rfl: 12   atorvastatin (LIPITOR) 40 MG tablet, TAKE (1) TABLET BY  MOUTH ONCE DAILY. (Patient taking differently: Take 40 mg by mouth daily. ), Disp: 90 tablet, Rfl: 0   B-D UF III MINI PEN NEEDLES 31G X 5 MM MISC, USE UP TO 4 TIMES DAILY WITH INSULIN AS DIRECTED., Disp: 150 each, Rfl: 5   cetirizine (ZYRTEC) 10 MG tablet, Take 1 tablet (10 mg total) by mouth daily., Disp: 30 tablet, Rfl: 0   Cholecalciferol (VITAMIN D3) 125 MCG (5000 UT) CAPS, Take 1 capsule (5,000 Units total) by mouth daily., Disp: 90 capsule, Rfl: 0   Continuous Blood Gluc Sensor (FREESTYLE LIBRE 14 DAY SENSOR) MISC, Inject 1 each into the skin every 14 (fourteen) days. Use as directed., Disp: 2 each, Rfl: 2   diltiazem (CARDIZEM) 30 MG tablet, TAKE (1) TABLET BY MOUTH TWICE DAILY., Disp: 180 tablet, Rfl: 0   Exenatide ER (BYDUREON BCISE) 2 MG/0.85ML AUIJ, Inject 1 each into the skin once a week., Disp: , Rfl:    FARXIGA 10 MG TABS tablet, Take 10 mg by mouth daily., Disp: , Rfl:    fluticasone (FLONASE) 50 MCG/ACT nasal spray, Place 2 sprays into both nostrils daily., Disp: 16 g, Rfl:  0   fluticasone-salmeterol (ADVAIR HFA) 115-21 MCG/ACT inhaler, Inhale 2 puffs into the lungs 2 (two) times daily., Disp: 1 Inhaler, Rfl: 5   gabapentin (NEURONTIN) 300 MG capsule, Take 2 capsules (600 mg total) by mouth 2 (two) times daily., Disp: 120 capsule, Rfl: 5   glucose blood (CONTOUR NEXT TEST) test strip, 1 each by Other route 4 (four) times daily. Use as instructed, Disp: 150 each, Rfl: 5   HUMALOG KWIKPEN 100 UNIT/ML KwikPen, Inject 25-40 Units into the skin 3 (three) times daily. , Disp: , Rfl:    Lancets MISC, 1 each by Does not apply route 4 (four) times daily., Disp: 150 each, Rfl: 5   lidocaine (LIDODERM) 5 %, Place 1 patch onto the skin daily. Place 1 patch to your are of most significant pain on your neck once per day as needed. Remove & Discard patch within 12 hours, Disp: 30 patch, Rfl: 0   losartan (COZAAR) 25 MG tablet, TAKE (1) TABLET BY MOUTH ONCE DAILY. (Patient taking differently: Take 25 mg by mouth daily. ), Disp: 90 tablet, Rfl: 2   metFORMIN (GLUCOPHAGE) 500 MG tablet, TAKE 2 TABLETS BY MOUTH TWICE DAILY WITH A MEAL., Disp: 120 tablet, Rfl: 0   pantoprazole (PROTONIX) 40 MG tablet, TAKE (1) TABLET BY MOUTH ONCE DAILY., Disp: 90 tablet, Rfl: 0   ranolazine (RANEXA) 1000 MG SR tablet, TAKE (1) TABLET BY MOUTH TWICE DAILY. (Patient taking differently: Take 1,000 mg by mouth 2 (two) times daily. ), Disp: 60 tablet, Rfl: 11   sertraline (ZOLOFT) 100 MG tablet, Take 1.5 tablets (150 mg total) by mouth daily., Disp: 135 tablet, Rfl: 2   tiZANidine (ZANAFLEX) 4 MG capsule, Take 1 capsule (4 mg total) by mouth 3 (three) times daily as needed for muscle spasms., Disp: 21 capsule, Rfl: 0   TOUJEO MAX SOLOSTAR 300 UNIT/ML Solostar Pen, INJECT 130 UNITS INTO THE SKIN AT BEDTIME. (Patient taking differently: Inject 80 Units into the skin at bedtime. ), Disp: 12 mL, Rfl: 0   traZODone (DESYREL) 50 MG tablet, Take 1 tablet (50 mg total) by mouth at bedtime. (Patient taking  differently: Take 50 mg by mouth at bedtime as needed. ), Disp: 30 tablet, Rfl: 2   nitroGLYCERIN (NITROSTAT) 0.4 MG SL tablet, Place 1 tablet (0.4 mg total) under the tongue every 5 (  five) minutes as needed. (Patient not taking: Reported on 05/12/2020), Disp: 25 tablet, Rfl: 3   sucralfate (CARAFATE) 1 g tablet, Take 1 tablet (1 g total) by mouth 2 (two) times daily before a meal., Disp: 60 tablet, Rfl: 3   Family History: family history includes CVA in his maternal grandfather and maternal grandmother; Cancer in his maternal aunt; Healthy in his son; Osteoporosis in his mother.    Social History:   reports that he quit smoking about 36 years ago. His smoking use included cigarettes. He started smoking about 47 years ago. He has a 5.00 pack-year smoking history. He quit smokeless tobacco use about 36 years ago.  His smokeless tobacco use included chew. He reports that he does not drink alcohol and does not use drugs.   Review of Systems: Constitutional: Denies weight loss/weight gain  Eyes: No changes in vision. ENT: No oral lesions, sore throat.  GI: see HPI.  Heme/Lymph: No easy bruising.  CV: No chest pain.  GU: No hematuria.  Integumentary: No rashes.  Neuro: No headaches.  Psych: No depression/anxiety.  Endocrine: No heat/cold intolerance.  Allergic/Immunologic: No urticaria.  Resp: No cough, SOB.  Musculoskeletal: No joint swelling.    Physical Examination: BP (!) 145/84 (BP Location: Right Arm, Patient Position: Sitting, Cuff Size: Normal)    Pulse 88    Temp 97.9 F (36.6 C) (Oral)    Ht 6' 2"  (1.88 m)    Wt 269 lb 8 oz (122.2 kg)    BMI 34.60 kg/m  Gen: NAD, alert and oriented x 4 HEENT: PEERLA, EOMI, Neck: supple, no JVD Chest: CTA bilaterally, no wheezes, crackles, or other adventitious sounds CV: RRR, no m/g/c/r Abd: soft, tender to palpation in epigastric area, ND, +BS in all four quadrants; no HSM, guarding, ridigity, or rebound tenderness Ext: no edema, well  perfused with 2+ pulses, Skin: no rash or lesions noted on observed skin Lymph: no noted LAD  Data Reviewed:  December 2020-vitamin D 18.6, TSH normal.  A1c 9.1.  PSA normal CMP with glucose 24 but LFTs normal.  Fibrosis scan January 2021-Liver: Moderately increased hepatic echogenicity. This limits evaluation for focal lesions. Portal vein is patent on color Doppler imaging with normal direction of blood flow towards the liver.  K PA  2.3 which is normal.  Assessment/Plan: Mr. Congrove is a 59 y.o. male   1.  History of Karlene Lineman cirrhosis-had liver biopsy at time of CCY in [place] around 2008 2009, we do not have a copy of this.  Recent FibroScan January 2021 did not show evidence of contour changes to suggest cirrhosis.  He is due for yearly labs today but last labs show normal albumin.  Will check CBC, CMP.  We will hold off on AFP and INR given reassuring ultrasound findings in January.  Continue to focus on weight loss given his diabetes.  2.  Epigastric pain-worsening over the past 48 hours but appears to be a chronic issue.  Endoscopy May 2020 time for epigastric pain was fairly normal except gastritis.  He is status post CCY.  Will check basic labs today and start him on Carafate.  If no improvement will consider antispasmodic PRN. On Zoloft so unable to try TCA for pain modulation.  He denies any NSAID, alcohol or tobacco  3.  History of colon polyps-due for routine colonoscopy next year  Orders Placed This Encounter  Procedures   COMPLETE METABOLIC PANEL WITH GFR   CBC with Differential   Lipase  Follow-up pending lab results and clinical response  I personally performed the service, non-incident to. (WP)  Laurine Blazer, [facility]

## 2020-05-12 NOTE — Patient Instructions (Signed)
Bland diet over next few days. We are checking labs and will call w/ results

## 2020-05-13 LAB — CBC WITH DIFFERENTIAL/PLATELET
Absolute Monocytes: 508 cells/uL (ref 200–950)
Basophils Absolute: 49 cells/uL (ref 0–200)
Basophils Relative: 0.9 %
Eosinophils Absolute: 108 cells/uL (ref 15–500)
Eosinophils Relative: 2 %
HCT: 42.5 % (ref 38.5–50.0)
Hemoglobin: 14.1 g/dL (ref 13.2–17.1)
Lymphs Abs: 1814 cells/uL (ref 850–3900)
MCH: 28 pg (ref 27.0–33.0)
MCHC: 33.2 g/dL (ref 32.0–36.0)
MCV: 84.3 fL (ref 80.0–100.0)
MPV: 11 fL (ref 7.5–12.5)
Monocytes Relative: 9.4 %
Neutro Abs: 2921 cells/uL (ref 1500–7800)
Neutrophils Relative %: 54.1 %
Platelets: 174 10*3/uL (ref 140–400)
RBC: 5.04 10*6/uL (ref 4.20–5.80)
RDW: 13.5 % (ref 11.0–15.0)
Total Lymphocyte: 33.6 %
WBC: 5.4 10*3/uL (ref 3.8–10.8)

## 2020-05-13 LAB — COMPLETE METABOLIC PANEL WITH GFR
AG Ratio: 2 (calc) (ref 1.0–2.5)
ALT: 55 U/L — ABNORMAL HIGH (ref 9–46)
AST: 41 U/L — ABNORMAL HIGH (ref 10–35)
Albumin: 4.5 g/dL (ref 3.6–5.1)
Alkaline phosphatase (APISO): 46 U/L (ref 35–144)
BUN: 11 mg/dL (ref 7–25)
CO2: 26 mmol/L (ref 20–32)
Calcium: 9.5 mg/dL (ref 8.6–10.3)
Chloride: 101 mmol/L (ref 98–110)
Creat: 0.99 mg/dL (ref 0.70–1.33)
GFR, Est African American: 97 mL/min/{1.73_m2} (ref >=60)
GFR, Est Non African American: 84 mL/min/{1.73_m2} (ref >=60)
Globulin: 2.3 g/dL (calc) (ref 1.9–3.7)
Glucose, Bld: 222 mg/dL — ABNORMAL HIGH (ref 65–139)
Potassium: 4.3 mmol/L (ref 3.5–5.3)
Sodium: 137 mmol/L (ref 135–146)
Total Bilirubin: 0.7 mg/dL (ref 0.2–1.2)
Total Protein: 6.8 g/dL (ref 6.1–8.1)

## 2020-05-13 LAB — LIPASE: Lipase: 20 U/L (ref 7–60)

## 2020-05-17 ENCOUNTER — Telehealth: Payer: Self-pay

## 2020-05-17 NOTE — Telephone Encounter (Addendum)
Spoke with patient asked MRI questions.  Contacted [facility] in [place] and scheduled patients MRI for Thursday May 26, 2020 at 12:15pm with a 11:45am arrival.     Patient notified directly and voiced understanding.

## 2020-05-19 ENCOUNTER — Encounter (INDEPENDENT_AMBULATORY_CARE_PROVIDER_SITE_OTHER): Payer: Self-pay | Admitting: Gastroenterology

## 2020-05-20 DIAGNOSIS — J343 Hypertrophy of nasal turbinates: Secondary | ICD-10-CM | POA: Diagnosis not present

## 2020-05-20 DIAGNOSIS — J310 Chronic rhinitis: Secondary | ICD-10-CM | POA: Diagnosis not present

## 2020-05-20 DIAGNOSIS — J342 Deviated nasal septum: Secondary | ICD-10-CM | POA: Diagnosis not present

## 2020-05-23 DIAGNOSIS — E1165 Type 2 diabetes mellitus with hyperglycemia: Secondary | ICD-10-CM | POA: Diagnosis not present

## 2020-05-24 ENCOUNTER — Telehealth: Payer: Self-pay | Admitting: Neurology

## 2020-05-24 NOTE — Telephone Encounter (Signed)
Order has been faxed three times.  Contacted [facility] to see if they have received my fax.   Spoke with scheduler who stated the order was received but there is not name or date of birth. Explained to scheduler that name and dob are attached to anything we print from the patients chart.   Advised her that I will write the patients name and dob on the sheet and fax it back. She voiced understanding.

## 2020-05-24 NOTE — Telephone Encounter (Signed)
[facility] called and requested an order for: MRI cervical spine without IV contrast to be faxed to: [phone].

## 2020-05-24 NOTE — Telephone Encounter (Signed)
[facility] called and said the order does not have the patient's DOB on it. Please refax it with DOB.

## 2020-05-25 ENCOUNTER — Ambulatory Visit: Payer: PRIVATE HEALTH INSURANCE | Admitting: Family Medicine

## 2020-05-26 ENCOUNTER — Telehealth: Payer: Self-pay | Admitting: Orthopedic Surgery

## 2020-05-26 DIAGNOSIS — M25521 Pain in right elbow: Secondary | ICD-10-CM

## 2020-05-26 DIAGNOSIS — M50223 Other cervical disc displacement at C6-C7 level: Secondary | ICD-10-CM | POA: Diagnosis not present

## 2020-05-26 DIAGNOSIS — M4802 Spinal stenosis, cervical region: Secondary | ICD-10-CM | POA: Diagnosis not present

## 2020-05-26 NOTE — Telephone Encounter (Signed)
Patient called to first inquire about report which his attorney, Annamaria Helling, had faxed. I confirmed that Dr [name] did receive it. (copy has been placed back in Dr's box)  Patient states he has been denied an MRI by  hand specialist, Dr. Burney Gauze, due to hand specialist being the second opinion doctor. States has been advised that Dr [name] would need to get the MRI approved. I relayed we may need to receive this information in writing, and would then need to address accordingly. Please advise.

## 2020-05-27 ENCOUNTER — Telehealth: Payer: Self-pay | Admitting: Neurology

## 2020-05-27 ENCOUNTER — Telehealth: Payer: Self-pay | Admitting: Physical Medicine and Rehabilitation

## 2020-05-27 DIAGNOSIS — R519 Headache, unspecified: Secondary | ICD-10-CM

## 2020-05-27 NOTE — Telephone Encounter (Signed)
Left message for patient to contact the office to discuss mri results.

## 2020-05-27 NOTE — Telephone Encounter (Signed)
Received results from patient's MRI cervical spine without contrast.  No films were sent.  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  There is right paracentral disc bulge at C7-T1 which partially effaces the thecal sac but no nerve root compression.  Tee, let patient know that there is evidence of nerve root compression on his MRI, which may be the source of some of the neck pain.  This is, as I told him, out of my area of expertise as a movement disorder physician.  He is already seeing orthopedics and can address with them, or we can send a referral to neurosurgery if he would like a referral there.  If he is still having headaches, I told him I would be happy to refer to Dr. Tommi Rumps (headache neurologist).

## 2020-05-27 NOTE — Telephone Encounter (Signed)
Patient called. He would like to schedule an appointment with Dr. Ernestina Patches. His call back number is [phone]

## 2020-05-27 NOTE — Telephone Encounter (Signed)
Spoke with patient and gave him his test results. He states he is going to speak with his orthopedics doctor and see what they can do and if they can not help him he will contact our office to be referred to neurosurgery.   Patient states he is interest in being referred to the headache neurologist.

## 2020-05-30 NOTE — Telephone Encounter (Signed)
Right L3-4 IL on 01/25/20 . Ok to repeat if helped, same problem/side, and no new injury?

## 2020-05-30 NOTE — Telephone Encounter (Signed)
Yes ok otherwise ov

## 2020-05-31 ENCOUNTER — Telehealth: Payer: Self-pay

## 2020-05-31 NOTE — Telephone Encounter (Signed)
Received call from [place]. She states they are unable to see treat the patient for headache with movement disorders. She states Dr Domingo Cocking only treats headaches and migraines. She stated she read in Dr Arturo Morton' note that the patient may need a nerve block and [employer] does not pay for that service at their office. 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 She requested a call back if I had any questions.   Spoke with Dr Tat and she suggested I contact Dr Kirstie Mirza office for clarity.   Contacted Dr Kirstie Mirza office and spoke with Nevin Bloodgood; informed her that the patient was referred to Dr Tat for a movement disorder, but he does not have one. Explained to her that the patient was referred to their office for his headaches. Also explained that the patient has [employer] not workers comp. Informed her that Dr Tat did not want their office to perform any nerve block's Dr Tat only stated the patient may need one in her last note.    She voiced understanding and stated she was confused. Explained to her that we only want Dr Domingo Cocking to treat the patient for headaches. She voiced understanding.   Requested that she send a fax once the patient has an appointment scheduled. She voiced understanding.    Dr Tat made aware.

## 2020-05-31 NOTE — Telephone Encounter (Signed)
Patient wanted to discuss new problem of headaches. Scheduled for OV and patient will bring cervical MRI from [facility].

## 2020-06-01 ENCOUNTER — Other Ambulatory Visit: Payer: Self-pay

## 2020-06-01 ENCOUNTER — Ambulatory Visit (INDEPENDENT_AMBULATORY_CARE_PROVIDER_SITE_OTHER): Payer: PRIVATE HEALTH INSURANCE | Admitting: Gastroenterology

## 2020-06-01 DIAGNOSIS — R809 Proteinuria, unspecified: Secondary | ICD-10-CM | POA: Diagnosis not present

## 2020-06-01 DIAGNOSIS — E1165 Type 2 diabetes mellitus with hyperglycemia: Secondary | ICD-10-CM | POA: Diagnosis not present

## 2020-06-01 DIAGNOSIS — I2510 Atherosclerotic heart disease of native coronary artery without angina pectoris: Secondary | ICD-10-CM | POA: Diagnosis not present

## 2020-06-01 DIAGNOSIS — E119 Type 2 diabetes mellitus without complications: Secondary | ICD-10-CM | POA: Diagnosis not present

## 2020-06-02 ENCOUNTER — Other Ambulatory Visit: Payer: Self-pay

## 2020-06-02 ENCOUNTER — Telehealth: Payer: Self-pay | Admitting: Physical Medicine and Rehabilitation

## 2020-06-02 ENCOUNTER — Ambulatory Visit (HOSPITAL_COMMUNITY)
Admission: RE | Admit: 2020-06-02 | Discharge: 2020-06-02 | Disposition: A | Discharge status: Home / Self Care | Payer: Medicare Other | Source: Ambulatory Visit | Attending: Neurology | Admitting: Neurology

## 2020-06-02 ENCOUNTER — Encounter (HOSPITAL_COMMUNITY)
Admission: RE | Admit: 2020-06-02 | Discharge: 2020-06-02 | Disposition: A | Discharge status: Home / Self Care | Payer: Medicare Other | Source: Ambulatory Visit | Attending: Neurology | Admitting: Neurology

## 2020-06-02 DIAGNOSIS — R251 Tremor, unspecified: Secondary | ICD-10-CM

## 2020-06-02 DIAGNOSIS — G20 Parkinson's disease: Secondary | ICD-10-CM | POA: Diagnosis not present

## 2020-06-02 MED ORDER — IOFLUPANE I 123 185 MBQ/2.5ML IV SOLN
5.4200 | Freq: Once | INTRAVENOUS | Status: AC
  Administered 2020-06-02: 5.42 via INTRAVENOUS

## 2020-06-02 MED ORDER — IODINE STRONG (LUGOLS) 5 % PO SOLN
0.8000 mL | Freq: Once | ORAL | Status: AC
  Administered 2020-06-02: 0.8 mL via ORAL

## 2020-06-02 MED ORDER — IODINE STRONG (LUGOLS) 5 % PO SOLN
ORAL | Status: AC
  Filled 2020-06-02: qty 1

## 2020-06-02 NOTE — Telephone Encounter (Signed)
Patient called needing to cancel his appointment. Patient said he will reschedule at a later date.

## 2020-06-02 NOTE — Telephone Encounter (Signed)
Appointment canceled at patient's request.

## 2020-06-08 ENCOUNTER — Other Ambulatory Visit: Payer: Self-pay

## 2020-06-08 ENCOUNTER — Ambulatory Visit (INDEPENDENT_AMBULATORY_CARE_PROVIDER_SITE_OTHER): Payer: Medicare Other | Admitting: Gastroenterology

## 2020-06-08 ENCOUNTER — Encounter (INDEPENDENT_AMBULATORY_CARE_PROVIDER_SITE_OTHER): Payer: Self-pay | Admitting: Gastroenterology

## 2020-06-08 VITALS — BP 111/75 | HR 105 | Temp 98.5°F | Ht 74.0 in | Wt 265.9 lb

## 2020-06-08 DIAGNOSIS — R1084 Generalized abdominal pain: Secondary | ICD-10-CM | POA: Diagnosis not present

## 2020-06-08 DIAGNOSIS — R197 Diarrhea, unspecified: Secondary | ICD-10-CM | POA: Diagnosis not present

## 2020-06-08 DIAGNOSIS — R1013 Epigastric pain: Secondary | ICD-10-CM

## 2020-06-08 MED ORDER — ONDANSETRON HCL 4 MG PO TABS
4.0000 mg | ORAL_TABLET | Freq: Three times a day (TID) | ORAL | 1 refills | Status: DC | PRN
Start: 2020-06-08 — End: 2020-08-17

## 2020-06-08 MED ORDER — HYOSCYAMINE SULFATE 0.125 MG SL SUBL
0.1250 mg | SUBLINGUAL_TABLET | SUBLINGUAL | 0 refills | Status: DC | PRN
Start: 2020-06-08 — End: 2021-05-31

## 2020-06-08 MED ORDER — PANTOPRAZOLE SODIUM 40 MG PO TBEC: 40.0000 mg | DELAYED_RELEASE_TABLET | Freq: Two times a day (BID) | ORAL | 3 refills | Status: DC

## 2020-06-08 NOTE — Patient Instructions (Signed)
We are checking labs and stool studies for evaluation.  I sent an updated prescription to increase Protonix to twice a day to pharmacy, this will help treat possible gastric ulcers.  I also sent medicine Zofran to use as needed for nausea and Levsin to use as needed for abdominal pain.

## 2020-06-08 NOTE — Progress Notes (Signed)
Patient profile: Jared Griffin is a 59 y.o. male seen for f/up.  History of Present Illness: Jared Griffin is seen today for for follow-up.  He reports worsening symptoms since his last office visit.  He reports continuous burning and indigestion symptoms.  He endorses that the Carafate did not help and seem to make symptoms worse.  He has tried Pepcid over the last few days which is helped a little bit.  He feels symptoms get worse after eating and has a diffuse stabbing discomfort over his entire abdomen.  He reports having issues with "continuous diarrhea".  Has a lot of urgency postprandially.  No blood in stools or black stools.  He continues to have a lot of nausea but denies vomiting.  Feels that diarrhea is more prominent issue today and has worsened since his last visit  He denies any intake of tobacco, alcohol, NSAIDs.  He uses Tylenol for pain.  He denies any fevers or sick contacts.  No history of similar symptoms in the past.  He denies any diabetic medication changes when symptoms began.  Wt Readings from Last 3 Encounters:  06/08/20 265 lb 14.4 oz (120.6 kg)  05/12/20 269 lb 8 oz (122.2 kg)  05/10/20 268 lb (121.6 kg)    Last Colonoscopy: 09/2017-- Perianal skin tags found on perianal exam. - Three 6 to 7 mm polyps in the sigmoid colon and in the ascending colon, removed with a hot snare. Resected and retrieved. - One 5 mm polyp at the splenic flexure, removed with a cold snare. Resected and retrieved. - 12 mm lipoma at hepatic flexure. - Internal hemorrhoids. PATH-Patient had 4 small polyps removed. There are tubular adenomas and some with high-grade dysplasia but no evidence of invasive carcinoma. Results given to patient. Next colonoscopy in 3 years  Last Endoscopy: 01/2019-- Normal esophagus. - Z-line irregular, 42 cm from the incisors. - Multiple gastric polyps. Biopsied. - Erythematous mucosa in the gastric body. Biopsied. - Normal duodenal bulb and  second portion of the duodenum.    Past Medical History:  Past Medical History:  Diagnosis Date  . Anginal pain ([place])   . Anxiety   . COPD (chronic obstructive pulmonary disease) ([place])    very close to being diagnosed  . Coronary artery disease   . Coronary atherosclerosis of native coronary artery    a. 12/09/2013: s/p PCI with 3.0 x 28 Promus DES to mLAD  . Cyst (solitary) of breast 10/03/2017   removed from back  . Depression   . Diabetic neuropathy ([place])   . Dyspnea    take inhaler  . Elbow injury 07/25/2017   surgery for torn tendon  . Essential hypertension, benign   . Fatty liver disease, nonalcoholic   . Lateral epicondylitis of right elbow   . Mixed hyperlipidemia 2006  . Obesity   . Osgood-Schlatter's disease of right knee   . Type 2 diabetes mellitus ([place])     Problem List: Patient Active Problem List   Diagnosis Date Noted  . Tremor of both hands 05/06/2020  . Bilateral occipital neuralgia 05/06/2020  . Abdominal pain, epigastric 01/13/2019  . Anxiety 01/04/2019  . Radicular pain of left lower extremity 12/22/2018  . Moderate persistent asthma 10/25/2018  . Dizziness 07/12/2018  . Nonintractable headache   . Abnormal nuclear stress test   . Aftercare following surgery 07/25/17 08/13/2017  . Hx of colonic polyps 08/07/2017  . Lateral epicondylitis of right elbow   . Cervical nerve root impingement  12/09/2015  . Generalized anxiety disorder 09/27/2015  . Chest pain 09/20/2015  . Pain in the chest   . Depression 01/24/2015  . Esophageal reflux 01/24/2015  . Preoperative cardiovascular examination 06/28/2014  . DM type 2 causing vascular disease ([place])   . Mixed hyperlipidemia   . Fatty liver disease, nonalcoholic   . Obesity   . Intermediate coronary syndrome ([place]) 12/09/2013  . Fatty liver 05/05/2013  . Lumbago 05/21/2012  . Essential hypertension, benign 12/28/2009  . CAD S/P LAD DES March 2015 12/28/2009  . Hyperlipidemia LDL goal <70 11/25/2009    . Accelerating angina ([place]) 11/25/2009  . DM 11/24/2009  . ABDOMINAL PAIN, HX OF 11/24/2009    Past Surgical History: Past Surgical History:  Procedure Laterality Date  . BIOPSY  01/21/2019   Procedure: BIOPSY;  Surgeon: [name], MD;  [facility];  Service: Endoscopy;;  gastric bx and gastric polyps  . CARDIAC CATHETERIZATION  2011  . CARDIAC CATHETERIZATION N/A 09/20/2015   Procedure: Left Heart Cath and Coronary Angiography;  Surgeon: [name], MD;  Location: [facility];  Service: Cardiovascular;  Laterality: N/A;  . CHOLECYSTECTOMY  2003  . COLONOSCOPY  06/19/2012   Procedure: COLONOSCOPY;  Surgeon: [name], MD;  [facility];  Service: Endoscopy;  Laterality: N/A;  930  . COLONOSCOPY N/A 10/17/2017   Procedure: COLONOSCOPY;  Surgeon: [name], MD;  [facility];  Service: Endoscopy;  Laterality: N/A;  1225  . CYST EXCISION  07/2019  . ESOPHAGOGASTRODUODENOSCOPY (EGD) WITH PROPOFOL N/A 01/21/2019   Procedure: ESOPHAGOGASTRODUODENOSCOPY (EGD) WITH PROPOFOL;  Surgeon: [name], MD;  [facility];  Service: Endoscopy;  Laterality: N/A;  10:15am  . KNEE ARTHROPLASTY Right 1999  . LEFT HEART CATH AND CORONARY ANGIOGRAPHY N/A 02/26/2018   Procedure: LEFT HEART CATH AND CORONARY ANGIOGRAPHY;  Surgeon: [name], MD;  Location: [facility];  Service: Cardiovascular;  Laterality: N/A;  . LEFT HEART CATHETERIZATION WITH CORONARY ANGIOGRAM N/A 12/09/2013   Procedure: LEFT HEART CATHETERIZATION WITH CORONARY ANGIOGRAM;  Surgeon: [name], MD;  Location: [facility];  Service: Cardiovascular;  Laterality: N/A;  . Liver biopsy    . POLYPECTOMY  10/17/2017   Procedure: POLYPECTOMY;  Surgeon: [name], MD;  [facility];  Service: Endoscopy;;  ascending colon, splenic flexure,sigmoid x2  . TENNIS ELBOW RELEASE/NIRSCHEL PROCEDURE Right 07/25/2017   Procedure: TENNIS  ELBOW RELEASE and debriedment;  Surgeon: [name], MD;  Location: [facility];  Service: Orthopedics;  Laterality: Right;  . TONSILLECTOMY  1970's    Allergies: Allergies  Allergen Reactions  . Metoprolol Rash      Home Medications:  Current Outpatient Medications:  .  albuterol (VENTOLIN HFA) 108 (90 Base) MCG/ACT inhaler, INHALE 2 PUFFS INTO LUNGS EVERY 6 HOURS AS NEEDED FOR WHEEZING AND SHORTNESS OF BREATH., Disp: 8.5 g, Rfl: 5 .  ALPRAZolam (XANAX) 0.5 MG tablet, Take 1 tablet (0.5 mg total) by mouth 3 (three) times daily as needed for sleep or anxiety., Disp: 90 tablet, Rfl: 2 .  aspirin (ASPIR-LOW) 81 MG EC tablet, Take 1 tablet (81 mg total) by mouth daily., Disp: 30 tablet, Rfl: 12 .  atorvastatin (LIPITOR) 40 MG tablet, TAKE (1) TABLET BY MOUTH ONCE DAILY. (Patient taking differently: Take 40 mg by mouth daily. ), Disp: 90 tablet, Rfl: 0 .  B-D UF III MINI PEN NEEDLES 31G X 5 MM MISC,  USE UP TO 4 TIMES DAILY WITH INSULIN AS DIRECTED., Disp: 150 each, Rfl: 5 .  cetirizine (ZYRTEC) 10 MG tablet, Take 1 tablet (10 mg total) by mouth daily., Disp: 30 tablet, Rfl: 0 .  Cholecalciferol (VITAMIN D3) 125 MCG (5000 UT) CAPS, Take 1 capsule (5,000 Units total) by mouth daily., Disp: 90 capsule, Rfl: 0 .  Continuous Blood Gluc Sensor (FREESTYLE LIBRE 14 DAY SENSOR) MISC, Inject 1 each into the skin every 14 (fourteen) days. Use as directed., Disp: 2 each, Rfl: 2 .  diltiazem (CARDIZEM) 30 MG tablet, TAKE (1) TABLET BY MOUTH TWICE DAILY., Disp: 180 tablet, Rfl: 0 .  Exenatide ER (BYDUREON BCISE) 2 MG/0.85ML AUIJ, Inject 1 each into the skin once a week., Disp: , Rfl:  .  FARXIGA 10 MG TABS tablet, Take 10 mg by mouth daily., Disp: , Rfl:  .  fluticasone (FLONASE) 50 MCG/ACT nasal spray, Place 2 sprays into both nostrils daily., Disp: 16 g, Rfl: 0 .  fluticasone-salmeterol (ADVAIR HFA) 115-21 MCG/ACT inhaler, Inhale 2 puffs into the lungs 2 (two) times daily., Disp: 1 Inhaler, Rfl: 5 .   gabapentin (NEURONTIN) 300 MG capsule, Take 2 capsules (600 mg total) by mouth 2 (two) times daily., Disp: 120 capsule, Rfl: 5 .  glucose blood (CONTOUR NEXT TEST) test strip, 1 each by Other route 4 (four) times daily. Use as instructed, Disp: 150 each, Rfl: 5 .  HUMALOG KWIKPEN 100 UNIT/ML KwikPen, Inject 25-40 Units into the skin 3 (three) times daily. , Disp: , Rfl:  .  Lancets MISC, 1 each by Does not apply route 4 (four) times daily., Disp: 150 each, Rfl: 5 .  lidocaine (LIDODERM) 5 %, Place 1 patch onto the skin daily. Place 1 patch to your are of most significant pain on your neck once per day as needed. Remove & Discard patch within 12 hours, Disp: 30 patch, Rfl: 0 .  losartan (COZAAR) 25 MG tablet, TAKE (1) TABLET BY MOUTH ONCE DAILY. (Patient taking differently: Take 25 mg by mouth daily. ), Disp: 90 tablet, Rfl: 2 .  metFORMIN (GLUCOPHAGE) 500 MG tablet, TAKE 2 TABLETS BY MOUTH TWICE DAILY WITH A MEAL., Disp: 120 tablet, Rfl: 0 .  nitroGLYCERIN (NITROSTAT) 0.4 MG SL tablet, Place 1 tablet (0.4 mg total) under the tongue every 5 (five) minutes as needed., Disp: 25 tablet, Rfl: 3 .  pantoprazole (PROTONIX) 40 MG tablet, Take 1 tablet (40 mg total) by mouth 2 (two) times daily before a meal. TAKE (1) TABLET BY MOUTH ONCE DAILY., Disp: 60 tablet, Rfl: 3 .  ranolazine (RANEXA) 1000 MG SR tablet, TAKE (1) TABLET BY MOUTH TWICE DAILY. (Patient taking differently: Take 1,000 mg by mouth 2 (two) times daily. ), Disp: 60 tablet, Rfl: 11 .  sertraline (ZOLOFT) 100 MG tablet, Take 1.5 tablets (150 mg total) by mouth daily., Disp: 135 tablet, Rfl: 2 .  tiZANidine (ZANAFLEX) 4 MG capsule, Take 1 capsule (4 mg total) by mouth 3 (three) times daily as needed for muscle spasms., Disp: 21 capsule, Rfl: 0 .  TOUJEO MAX SOLOSTAR 300 UNIT/ML Solostar Pen, INJECT 130 UNITS INTO THE SKIN AT BEDTIME. (Patient taking differently: Inject 80 Units into the skin at bedtime. ), Disp: 12 mL, Rfl: 0 .  traZODone (DESYREL)  50 MG tablet, Take 1 tablet (50 mg total) by mouth at bedtime. (Patient taking differently: Take 50 mg by mouth at bedtime as needed. ), Disp: 30 tablet, Rfl: 2 .  hyoscyamine (LEVSIN SL) 0.125 MG  SL tablet, Place 1 tablet (0.125 mg total) under the tongue every 4 (four) hours as needed., Disp: 30 tablet, Rfl: 0 .  ondansetron (ZOFRAN) 4 MG tablet, Take 1 tablet (4 mg total) by mouth every 8 (eight) hours as needed for nausea or vomiting., Disp: 30 tablet, Rfl: 1 .  sucralfate (CARAFATE) 1 g tablet, Take 1 tablet (1 g total) by mouth 2 (two) times daily before a meal. (Patient not taking: Reported on 06/08/2020), Disp: 60 tablet, Rfl: 3   Family History: family history includes CVA in his maternal grandfather and maternal grandmother; Cancer in his maternal aunt; Healthy in his son; Osteoporosis in his mother.    Social History:   reports that he quit smoking about 36 years ago. His smoking use included cigarettes. He started smoking about 47 years ago. He has a 5.00 pack-year smoking history. He quit smokeless tobacco use about 36 years ago.  His smokeless tobacco use included chew. He reports that he does not drink alcohol and does not use drugs.   Review of Systems: Constitutional: Denies weight loss/weight gain  Eyes: No changes in vision. ENT: No oral lesions, sore throat.  GI: see HPI.  Heme/Lymph: No easy bruising.  CV: No chest pain.  GU: No hematuria.  Integumentary: No rashes.  Neuro: No headaches.  Psych: No depression/anxiety.  Endocrine: No heat/cold intolerance.  Allergic/Immunologic: No urticaria.  Resp: No cough, SOB.  Musculoskeletal: No joint swelling.    Physical Examination: BP 111/75 (BP Location: Right Arm, Patient Position: Sitting, Cuff Size: Normal)   Pulse (!) 105   Temp 98.5 F (36.9 C) (Oral)   Ht 6' 2"  (1.88 m)   Wt 265 lb 14.4 oz (120.6 kg)   BMI 34.14 kg/m  Gen: NAD, alert and oriented x 4 HEENT: PEERLA, EOMI, Neck: supple, no JVD Chest: CTA  bilaterally, no wheezes, crackles, or other adventitious sounds CV: RRR, no m/g/c/r Abd: soft, NT, ND, +BS in all four quadrants; no HSM, guarding, ridigity, or rebound tenderness Ext: no edema, well perfused with 2+ pulses, Skin: no rash or lesions noted on observed skin Lymph: no noted LAD  Data Reviewed:  05/12/20-CBC, CMP, lipase normal.   Assessment/Plan: Mr. Pipkins is a 59 y.o. male seen today for worsening symptoms.  Was seen about 4 weeks ago with epigastric pain and empirically treated with Carafate for chronic gastritis, had mild diarrhea after that OV that has continuously worsened.  1.  Epigastric pain-reports this is worsening and now has turned into diffuse abdominal pain associate with severe diarrhea.  He has lost about 4 pounds since his visit a few weeks ago.  States he has constant nausea and pain.  Carafate did not improve symptoms.  Will try increasing his Protonix to twice a day and giving Zofran & Levsin use as needed.  Needs labs and stool study as below for further evaluation. Bland diet in interim.    Havard was seen today for follow-up.  Diagnoses and all orders for this visit:  Abdominal pain, epigastric -     Gastrointestinal Pathogen Panel PCR -     C. difficile GDH and Toxin A/B -     COMPLETE METABOLIC PANEL WITH GFR -     CBC with Differential -     Pancreatic elastase, fecal -     C-reactive protein -     Sed Rate (ESR)  Diffuse abdominal pain -     Gastrointestinal Pathogen Panel PCR -     C. difficile  GDH and Toxin A/B -     COMPLETE METABOLIC PANEL WITH GFR -     CBC with Differential -     Pancreatic elastase, fecal -     C-reactive protein -     Sed Rate (ESR)  Diarrhea, unspecified type -     Gastrointestinal Pathogen Panel PCR -     C. difficile GDH and Toxin A/B -     COMPLETE METABOLIC PANEL WITH GFR -     CBC with Differential -     Pancreatic elastase, fecal -     C-reactive protein -     Sed Rate (ESR)  Other orders -      pantoprazole (PROTONIX) 40 MG tablet; Take 1 tablet (40 mg total) by mouth 2 (two) times daily before a meal. TAKE (1) TABLET BY MOUTH ONCE DAILY. -     ondansetron (ZOFRAN) 4 MG tablet; Take 1 tablet (4 mg total) by mouth every 8 (eight) hours as needed for nausea or vomiting. -     hyoscyamine (LEVSIN SL) 0.125 MG SL tablet; Place 1 tablet (0.125 mg total) under the tongue every 4 (four) hours as needed.     Recommendations: Further recommendations pending lab and stool study results   I personally performed the service, non-incident to. (WP)  Laurine Blazer, [facility]

## 2020-06-09 ENCOUNTER — Encounter (HOSPITAL_COMMUNITY): Payer: Self-pay | Admitting: Physical Therapy

## 2020-06-09 DIAGNOSIS — R1084 Generalized abdominal pain: Secondary | ICD-10-CM | POA: Diagnosis not present

## 2020-06-09 DIAGNOSIS — R197 Diarrhea, unspecified: Secondary | ICD-10-CM | POA: Diagnosis not present

## 2020-06-09 DIAGNOSIS — R1013 Epigastric pain: Secondary | ICD-10-CM | POA: Diagnosis not present

## 2020-06-09 LAB — COMPLETE METABOLIC PANEL WITH GFR
AG Ratio: 1.7 (calc) (ref 1.0–2.5)
ALT: 41 U/L (ref 9–46)
AST: 38 U/L — ABNORMAL HIGH (ref 10–35)
Albumin: 4.3 g/dL (ref 3.6–5.1)
Alkaline phosphatase (APISO): 46 U/L (ref 35–144)
BUN: 14 mg/dL (ref 7–25)
CO2: 23 mmol/L (ref 20–32)
Calcium: 9.3 mg/dL (ref 8.6–10.3)
Chloride: 101 mmol/L (ref 98–110)
Creat: 1.08 mg/dL (ref 0.70–1.33)
GFR, Est African American: 87 mL/min/{1.73_m2} (ref >=60)
GFR, Est Non African American: 75 mL/min/{1.73_m2} (ref >=60)
Globulin: 2.6 g/dL (calc) (ref 1.9–3.7)
Glucose, Bld: 205 mg/dL — ABNORMAL HIGH (ref 65–99)
Potassium: 4.2 mmol/L (ref 3.5–5.3)
Sodium: 137 mmol/L (ref 135–146)
Total Bilirubin: 0.6 mg/dL (ref 0.2–1.2)
Total Protein: 6.9 g/dL (ref 6.1–8.1)

## 2020-06-09 LAB — CBC WITH DIFFERENTIAL/PLATELET
Absolute Monocytes: 484 cells/uL (ref 200–950)
Basophils Absolute: 62 cells/uL (ref 0–200)
Basophils Relative: 1 %
Eosinophils Absolute: 254 cells/uL (ref 15–500)
Eosinophils Relative: 4.1 %
HCT: 44 % (ref 38.5–50.0)
Hemoglobin: 14.4 g/dL (ref 13.2–17.1)
Lymphs Abs: 1804 cells/uL (ref 850–3900)
MCH: 27.4 pg (ref 27.0–33.0)
MCHC: 32.7 g/dL (ref 32.0–36.0)
MCV: 83.8 fL (ref 80.0–100.0)
MPV: 11.7 fL (ref 7.5–12.5)
Monocytes Relative: 7.8 %
Neutro Abs: 3596 cells/uL (ref 1500–7800)
Neutrophils Relative %: 58 %
Platelets: 164 10*3/uL (ref 140–400)
RBC: 5.25 10*6/uL (ref 4.20–5.80)
RDW: 14 % (ref 11.0–15.0)
Total Lymphocyte: 29.1 %
WBC: 6.2 10*3/uL (ref 3.8–10.8)

## 2020-06-09 LAB — C-REACTIVE PROTEIN: CRP: 0.4 mg/L (ref <8.0)

## 2020-06-09 LAB — SEDIMENTATION RATE: Sed Rate: 9 mm/h (ref 0–20)

## 2020-06-09 NOTE — Therapy (Signed)
[facility] [place], [place], [address] Phone: [phone]   Fax:  [phone]  Patient Details  Name: Jared Griffin MRN: [id number] Date of Birth: [birth year] Referring Provider:  No ref. provider found  Encounter Date: 06/09/2020    PHYSICAL THERAPY DISCHARGE SUMMARY  Visits from Start of Care: 5 Current functional level related to goals / functional outcomes: Last treatment pt was doing much better.   Remaining deficits: None known   Education / Equipment: HEP  Plan: Patient agrees to discharge.  Patient goals were partially met. Patient is being discharged due to                                                     ?????  Clinic shut down due to Covid; pt did not return after clinic reopened.   Rayetta Humphrey, PT CLT [phone] 06/09/2020, 9:49 AM  [facility] [address] [place], [place], [address] Phone: [phone]   Fax:  [phone]

## 2020-06-15 ENCOUNTER — Ambulatory Visit: Payer: Medicare Other | Admitting: Physical Medicine and Rehabilitation

## 2020-06-16 ENCOUNTER — Other Ambulatory Visit (INDEPENDENT_AMBULATORY_CARE_PROVIDER_SITE_OTHER): Payer: Self-pay | Admitting: Gastroenterology

## 2020-06-16 LAB — GASTROINTESTINAL PATHOGEN PANEL PCR
C. difficile Tox A/B, PCR: NOT DETECTED
Campylobacter, PCR: NOT DETECTED
Cryptosporidium, PCR: NOT DETECTED
E coli (ETEC) LT/ST PCR: NOT DETECTED
E coli (STEC) stx1/stx2, PCR: NOT DETECTED
E coli 0157, PCR: NOT DETECTED
Giardia lamblia, PCR: NOT DETECTED
Norovirus, PCR: NOT DETECTED
Rotavirus A, PCR: NOT DETECTED
Salmonella, PCR: NOT DETECTED
Shigella, PCR: NOT DETECTED

## 2020-06-16 LAB — C. DIFFICILE GDH AND TOXIN A/B
GDH ANTIGEN: NOT DETECTED
MICRO NUMBER:: 10992626
SPECIMEN QUALITY:: ADEQUATE
TOXIN A AND B: NOT DETECTED

## 2020-06-16 LAB — PANCREATIC ELASTASE, FECAL: Pancreatic Elastase-1, Stool: 184 mcg/g — ABNORMAL LOW

## 2020-06-16 MED ORDER — CREON 24000-76000 UNITS PO CPEP: 48000.0000 [IU] | ORAL_CAPSULE | Freq: Three times a day (TID) | ORAL | 1 refills | Status: DC

## 2020-06-17 ENCOUNTER — Telehealth (INDEPENDENT_AMBULATORY_CARE_PROVIDER_SITE_OTHER): Payer: Self-pay

## 2020-06-17 NOTE — Telephone Encounter (Signed)
Jared Griffin went by the pharmacy to pick up his medication and he states it will cost him well over $300 out of pocket, please advise?

## 2020-06-20 NOTE — Telephone Encounter (Signed)
Please have patient check with insurance or phamarcy to see if any pancreatic enzyme replacements are covered?  An alternative would be to zenpep. If not can we do prior auth?

## 2020-06-20 NOTE — Telephone Encounter (Signed)
Spoke with patient - relayed Dr Ruthe Mannan written note (copy to scan center)  - in reference to this telephone encounter:  (1) Repeat MRI Right Elbow, specific attention to Rad.N. (2) Radial Tunnel injection - see Dr Burney Gauze (3) Follow up here after both done   - patient relayed today, Monday, 06/20/20, that worker's comp's 3rd party, [employer]", is to be faxing a request for the MRI of right elbow, as patient relays worker's comp relays that the MRI order is to come from Dr [name]. Pending. Patient aware we have not received.

## 2020-06-20 NOTE — Telephone Encounter (Signed)
Jared Griffin is checking with his pharmacy for his insurance formulary

## 2020-06-22 DIAGNOSIS — E1165 Type 2 diabetes mellitus with hyperglycemia: Secondary | ICD-10-CM | POA: Diagnosis not present

## 2020-06-22 NOTE — Telephone Encounter (Signed)
Jared Griffin - can you check w/ patient to see if he heard back from his insurance? Thanks.

## 2020-06-22 NOTE — Telephone Encounter (Signed)
Burlin states that he has had other issues come up he has just got back in town, he will try to reach back out to his pharmacy and insurance today

## 2020-06-23 ENCOUNTER — Telehealth (INDEPENDENT_AMBULATORY_CARE_PROVIDER_SITE_OTHER): Payer: Self-pay | Admitting: Gastroenterology

## 2020-06-23 NOTE — Telephone Encounter (Signed)
[email or web address]  - can you have him try to print a savings card? This is website.    If not can I possibly do a prior auth since he had low fecal elastase? Thanks.

## 2020-06-23 NOTE — Telephone Encounter (Signed)
Noted thank you

## 2020-06-23 NOTE — Telephone Encounter (Signed)
Jared Griffin called back stating that his insurance does not pay for any pancreatic enzyme therapy at this time

## 2020-06-23 NOTE — Telephone Encounter (Signed)
He is going to try the Creon copay savings card first

## 2020-06-23 NOTE — Telephone Encounter (Signed)
Patient returned your call.

## 2020-06-28 NOTE — Telephone Encounter (Signed)
Can you check if patient was able to get Creon savings card? Thanks.

## 2020-06-29 NOTE — Telephone Encounter (Signed)
Patient has NOT came to pick up Creon discount card yet he claims he has had a lot going on he will try to get by tomorrow and pick that up and take it to the pharmacy

## 2020-06-30 ENCOUNTER — Telehealth (INDEPENDENT_AMBULATORY_CARE_PROVIDER_SITE_OTHER): Payer: Self-pay | Admitting: Gastroenterology

## 2020-06-30 NOTE — Telephone Encounter (Signed)
I returned his call and he understands what to do to present the Creon copay card now

## 2020-06-30 NOTE — Telephone Encounter (Signed)
Patient left voice mail message wanting you to call him - ph# [phone]

## 2020-07-01 ENCOUNTER — Other Ambulatory Visit: Payer: Self-pay | Admitting: Cardiology

## 2020-07-06 ENCOUNTER — Other Ambulatory Visit: Payer: Self-pay | Admitting: Cardiology

## 2020-07-07 ENCOUNTER — Other Ambulatory Visit: Payer: Self-pay

## 2020-07-07 ENCOUNTER — Encounter (INDEPENDENT_AMBULATORY_CARE_PROVIDER_SITE_OTHER): Payer: Self-pay | Admitting: Gastroenterology

## 2020-07-07 ENCOUNTER — Ambulatory Visit (INDEPENDENT_AMBULATORY_CARE_PROVIDER_SITE_OTHER): Payer: Medicare Other | Admitting: Gastroenterology

## 2020-07-07 VITALS — BP 141/88 | HR 112 | Temp 99.0°F | Ht 74.0 in | Wt 265.2 lb

## 2020-07-07 DIAGNOSIS — R195 Other fecal abnormalities: Secondary | ICD-10-CM

## 2020-07-07 DIAGNOSIS — R110 Nausea: Secondary | ICD-10-CM | POA: Diagnosis not present

## 2020-07-07 DIAGNOSIS — R1013 Epigastric pain: Secondary | ICD-10-CM

## 2020-07-07 NOTE — Patient Instructions (Signed)
We are starting a trial of Creon-take 2 tablets before meals and 1 tablet before snacks.  Please let me know if this improves symptoms.  I have ordered a CT and we will contact you to schedule.

## 2020-07-07 NOTE — Progress Notes (Signed)
Patient profile: Jerrad Mendibles is a 59 y.o. male seen for f/up of abd pain. Last seen in clinic 05/2020.    History of Present Illness: Labib Cwynar is seen today for follow-up.  After his last visit he was found to have a minimally low fecal elastase.  We were not able to get Creon covered well with Creon savings card or find any pancreatic enzyme supplements covered well by his insurance.  He reports continuing to have symptoms are described as a mild constant stomachache, feels like a constant burning as well.  He is on PPI with minimal improvement.  He tried Carafate which made pain worse.  He does not have any GERD symptoms but does have nausea with the epigastric aching discomfort and Zofran helps.  He is not having dysphagia. Pain is located in his bilateral upper abdomen.  He does note that diarrhea is intermittent, has not kept food dairy but unable to find specific food triggers. When gets diarrhea can last for a few hrs to days. No severe lower abd pain or blood in stool.  Denies alcohol, tobacco, NSAIDS  Wt Readings from Last 3 Encounters:  07/07/20 265 lb 3.2 oz (120.3 kg)  06/08/20 265 lb 14.4 oz (120.6 kg)  05/12/20 269 lb 8 oz (122.2 kg)    Last Colonoscopy:09/2017-- Perianal skin tags found on perianal exam. - Three 6 to 7 mm polyps in the sigmoid colon and in the ascending colon, removed with a hot snare. Resected and retrieved. - One 5 mm polyp at the splenic flexure, removed with a cold snare. Resected and retrieved. - 12 mm lipoma at hepatic flexure. - Internal hemorrhoids. PATH-Patient had 4 small polyps removed. There are tubular adenomas and some with high-grade dysplasia but no evidence of invasive carcinoma. Results given to patient. Next colonoscopy in 3 years  Last Endoscopy:01/2019-- Normal esophagus. - Z-line irregular, 42 cm from the incisors. - Multiple gastric polyps. Biopsied. - Erythematous mucosa in the gastric body. Biopsied. -  Normal duodenal bulb and second portion of the duodenum   Past Medical History:  Past Medical History:  Diagnosis Date  . Anginal pain ([place])   . Anxiety   . COPD (chronic obstructive pulmonary disease) ([place])    very close to being diagnosed  . Coronary artery disease   . Coronary atherosclerosis of native coronary artery    a. 12/09/2013: s/p PCI with 3.0 x 28 Promus DES to mLAD  . Cyst (solitary) of breast 10/03/2017   removed from back  . Depression   . Diabetic neuropathy ([place])   . Dyspnea    take inhaler  . Elbow injury 07/25/2017   surgery for torn tendon  . Essential hypertension, benign   . Fatty liver disease, nonalcoholic   . Lateral epicondylitis of right elbow   . Mixed hyperlipidemia 2006  . Obesity   . Osgood-Schlatter's disease of right knee   . Type 2 diabetes mellitus ([place])     Problem List: Patient Active Problem List   Diagnosis Date Noted  . Tremor of both hands 05/06/2020  . Bilateral occipital neuralgia 05/06/2020  . Abdominal pain, epigastric 01/13/2019  . Anxiety 01/04/2019  . Radicular pain of left lower extremity 12/22/2018  . Moderate persistent asthma 10/25/2018  . Dizziness 07/12/2018  . Nonintractable headache   . Abnormal nuclear stress test   . Aftercare following surgery 07/25/17 08/13/2017  . Hx of colonic polyps 08/07/2017  . Lateral epicondylitis of right elbow   .  Cervical nerve root impingement 12/09/2015  . Generalized anxiety disorder 09/27/2015  . Chest pain 09/20/2015  . Pain in the chest   . Depression 01/24/2015  . Esophageal reflux 01/24/2015  . Preoperative cardiovascular examination 06/28/2014  . DM type 2 causing vascular disease ([place])   . Mixed hyperlipidemia   . Fatty liver disease, nonalcoholic   . Obesity   . Intermediate coronary syndrome ([place]) 12/09/2013  . Fatty liver 05/05/2013  . Lumbago 05/21/2012  . Essential hypertension, benign 12/28/2009  . CAD S/P LAD DES March 2015 12/28/2009  . Hyperlipidemia LDL  goal <70 11/25/2009  . Accelerating angina ([place]) 11/25/2009  . DM 11/24/2009  . ABDOMINAL PAIN, HX OF 11/24/2009    Past Surgical History: Past Surgical History:  Procedure Laterality Date  . BIOPSY  01/21/2019   Procedure: BIOPSY;  Surgeon: [name], MD;  [facility];  Service: Endoscopy;;  gastric bx and gastric polyps  . CARDIAC CATHETERIZATION  2011  . CARDIAC CATHETERIZATION N/A 09/20/2015   Procedure: Left Heart Cath and Coronary Angiography;  Surgeon: [name], MD;  Location: [facility];  Service: Cardiovascular;  Laterality: N/A;  . CHOLECYSTECTOMY  2003  . COLONOSCOPY  06/19/2012   Procedure: COLONOSCOPY;  Surgeon: [name], MD;  [facility];  Service: Endoscopy;  Laterality: N/A;  930  . COLONOSCOPY N/A 10/17/2017   Procedure: COLONOSCOPY;  Surgeon: [name], MD;  [facility];  Service: Endoscopy;  Laterality: N/A;  1225  . CYST EXCISION  07/2019  . ESOPHAGOGASTRODUODENOSCOPY (EGD) WITH PROPOFOL N/A 01/21/2019   Procedure: ESOPHAGOGASTRODUODENOSCOPY (EGD) WITH PROPOFOL;  Surgeon: [name], MD;  [facility];  Service: Endoscopy;  Laterality: N/A;  10:15am  . KNEE ARTHROPLASTY Right 1999  . LEFT HEART CATH AND CORONARY ANGIOGRAPHY N/A 02/26/2018   Procedure: LEFT HEART CATH AND CORONARY ANGIOGRAPHY;  Surgeon: [name], MD;  Location: [facility];  Service: Cardiovascular;  Laterality: N/A;  . LEFT HEART CATHETERIZATION WITH CORONARY ANGIOGRAM N/A 12/09/2013   Procedure: LEFT HEART CATHETERIZATION WITH CORONARY ANGIOGRAM;  Surgeon: [name], MD;  Location: [facility];  Service: Cardiovascular;  Laterality: N/A;  . Liver biopsy    . POLYPECTOMY  10/17/2017   Procedure: POLYPECTOMY;  Surgeon: [name], MD;  [facility];  Service: Endoscopy;;  ascending colon, splenic flexure,sigmoid x2  . TENNIS ELBOW RELEASE/NIRSCHEL PROCEDURE Right 07/25/2017     Procedure: TENNIS ELBOW RELEASE and debriedment;  Surgeon: [name], MD;  Location: [facility];  Service: Orthopedics;  Laterality: Right;  . TONSILLECTOMY  1970's    Allergies: Allergies  Allergen Reactions  . Metoprolol Rash      Home Medications:  Current Outpatient Medications:  .  albuterol (VENTOLIN HFA) 108 (90 Base) MCG/ACT inhaler, INHALE 2 PUFFS INTO LUNGS EVERY 6 HOURS AS NEEDED FOR WHEEZING AND SHORTNESS OF BREATH., Disp: 8.5 g, Rfl: 5 .  ALPRAZolam (XANAX) 0.5 MG tablet, Take 1 tablet (0.5 mg total) by mouth 3 (three) times daily as needed for sleep or anxiety., Disp: 90 tablet, Rfl: 2 .  aspirin (ASPIR-LOW) 81 MG EC tablet, Take 1 tablet (81 mg total) by mouth daily., Disp: 30 tablet, Rfl: 12 .  atorvastatin (LIPITOR) 40 MG tablet, TAKE (1) TABLET BY MOUTH ONCE DAILY., Disp: 90 tablet, Rfl: 0 .  B-D UF III MINI PEN NEEDLES 31G X 5 MM MISC, USE UP TO 4 TIMES DAILY  WITH INSULIN AS DIRECTED., Disp: 150 each, Rfl: 5 .  cetirizine (ZYRTEC) 10 MG tablet, Take 1 tablet (10 mg total) by mouth daily., Disp: 30 tablet, Rfl: 0 .  Continuous Blood Gluc Sensor (FREESTYLE LIBRE 14 DAY SENSOR) MISC, Inject 1 each into the skin every 14 (fourteen) days. Use as directed., Disp: 2 each, Rfl: 2 .  diltiazem (CARDIZEM) 30 MG tablet, TAKE (1) TABLET BY MOUTH TWICE DAILY., Disp: 180 tablet, Rfl: 0 .  Exenatide ER (BYDUREON BCISE) 2 MG/0.85ML AUIJ, Inject 1 each into the skin once a week., Disp: , Rfl:  .  FARXIGA 10 MG TABS tablet, Take 10 mg by mouth daily., Disp: , Rfl:  .  fluticasone (FLONASE) 50 MCG/ACT nasal spray, Place 2 sprays into both nostrils daily., Disp: 16 g, Rfl: 0 .  fluticasone-salmeterol (ADVAIR HFA) 115-21 MCG/ACT inhaler, Inhale 2 puffs into the lungs 2 (two) times daily., Disp: 1 Inhaler, Rfl: 5 .  gabapentin (NEURONTIN) 300 MG capsule, Take 2 capsules (600 mg total) by mouth 2 (two) times daily., Disp: 120 capsule, Rfl: 5 .  glucose blood (CONTOUR NEXT TEST) test  strip, 1 each by Other route 4 (four) times daily. Use as instructed, Disp: 150 each, Rfl: 5 .  HUMALOG KWIKPEN 100 UNIT/ML KwikPen, Inject 25-40 Units into the skin 3 (three) times daily. , Disp: , Rfl:  .  hyoscyamine (LEVSIN SL) 0.125 MG SL tablet, Place 1 tablet (0.125 mg total) under the tongue every 4 (four) hours as needed., Disp: 30 tablet, Rfl: 0 .  Lancets MISC, 1 each by Does not apply route 4 (four) times daily., Disp: 150 each, Rfl: 5 .  lidocaine (LIDODERM) 5 %, Place 1 patch onto the skin daily. Place 1 patch to your are of most significant pain on your neck once per day as needed. Remove & Discard patch within 12 hours, Disp: 30 patch, Rfl: 0 .  losartan (COZAAR) 25 MG tablet, TAKE (1) TABLET BY MOUTH ONCE DAILY. (Patient taking differently: Take 25 mg by mouth daily. ), Disp: 90 tablet, Rfl: 2 .  metFORMIN (GLUCOPHAGE) 500 MG tablet, TAKE 2 TABLETS BY MOUTH TWICE DAILY WITH A MEAL., Disp: 120 tablet, Rfl: 0 .  nitroGLYCERIN (NITROSTAT) 0.4 MG SL tablet, Place 1 tablet (0.4 mg total) under the tongue every 5 (five) minutes as needed., Disp: 25 tablet, Rfl: 3 .  ondansetron (ZOFRAN) 4 MG tablet, Take 1 tablet (4 mg total) by mouth every 8 (eight) hours as needed for nausea or vomiting., Disp: 30 tablet, Rfl: 1 .  Pancrelipase, Lip-Prot-Amyl, (CREON) 24000-76000 units CPEP, Take 2 capsules (48,000 Units total) by mouth 3 (three) times daily before meals. Take 2 capsule w/ meals and 1 capsule w/ snacks., Disp: 180 capsule, Rfl: 1 .  pantoprazole (PROTONIX) 40 MG tablet, Take 1 tablet (40 mg total) by mouth 2 (two) times daily before a meal. TAKE (1) TABLET BY MOUTH ONCE DAILY., Disp: 60 tablet, Rfl: 3 .  ranolazine (RANEXA) 1000 MG SR tablet, TAKE (1) TABLET BY MOUTH TWICE DAILY. (Patient taking differently: Take 1,000 mg by mouth 2 (two) times daily. ), Disp: 60 tablet, Rfl: 11 .  sertraline (ZOLOFT) 100 MG tablet, Take 1.5 tablets (150 mg total) by mouth daily., Disp: 135 tablet, Rfl: 2 .   sucralfate (CARAFATE) 1 g tablet, Take 1 tablet (1 g total) by mouth 2 (two) times daily before a meal., Disp: 60 tablet, Rfl: 3 .  tiZANidine (ZANAFLEX) 4 MG capsule, Take 1 capsule (4 mg  total) by mouth 3 (three) times daily as needed for muscle spasms., Disp: 21 capsule, Rfl: 0 .  TOUJEO MAX SOLOSTAR 300 UNIT/ML Solostar Pen, INJECT 130 UNITS INTO THE SKIN AT BEDTIME. (Patient taking differently: Inject 80 Units into the skin at bedtime. ), Disp: 12 mL, Rfl: 0 .  traZODone (DESYREL) 50 MG tablet, Take 1 tablet (50 mg total) by mouth at bedtime. (Patient taking differently: Take 50 mg by mouth at bedtime as needed. ), Disp: 30 tablet, Rfl: 2 .  Cholecalciferol (VITAMIN D3) 125 MCG (5000 UT) CAPS, Take 1 capsule (5,000 Units total) by mouth daily. (Patient not taking: Reported on 07/07/2020), Disp: 90 capsule, Rfl: 0   Family History: family history includes CVA in his maternal grandfather and maternal grandmother; Cancer in his maternal aunt; Healthy in his son; Osteoporosis in his mother.    Social History:   reports that he quit smoking about 36 years ago. His smoking use included cigarettes. He started smoking about 47 years ago. He has a 5.00 pack-year smoking history. He quit smokeless tobacco use about 36 years ago.  His smokeless tobacco use included chew. He reports that he does not drink alcohol and does not use drugs.   Review of Systems: Constitutional: Denies weight loss/weight gain  Eyes: No changes in vision. ENT: No oral lesions, sore throat.  GI: see HPI.  Heme/Lymph: No easy bruising.  CV: No chest pain.  GU: No hematuria.  Integumentary: No rashes.  Neuro: No headaches.  Psych: No depression/anxiety.  Endocrine: No heat/cold intolerance.  Allergic/Immunologic: No urticaria.  Resp: No cough, SOB.  Musculoskeletal: No joint swelling.    Physical Examination: BP (!) 141/88 (BP Location: Right Arm, Patient Position: Sitting, Cuff Size: Normal)   Pulse (!) 112   Temp 99  F (37.2 C) (Oral)   Ht 6' 2"  (1.88 m)   Wt 265 lb 3.2 oz (120.3 kg)   BMI 34.05 kg/m  Gen: NAD, alert and oriented x 4 HEENT: PEERLA, EOMI, Neck: supple, no JVD Chest: CTA bilaterally, no wheezes, crackles, or other adventitious sounds CV: RRR, no m/g/c/r Abd: soft, NT, ND, +BS in all four quadrants; no HSM, guarding, ridigity, or rebound tenderness Ext: no edema, well perfused with 2+ pulses, Skin: no rash or lesions noted on observed skin Lymph: no noted LAD  Data Reviewed:  September 2021 Fecal elastase - low at 184 C diff negative  & GI PCR negative   05/2020-sed rate, CRP, CBC, CMP normal except glucose 205 and AST 38  04/2020-Lipase cbc cmp normal   Assessment/Plan: Mr. Morandi is a 59 y.o. male seen for follow up.   1. Abd pain-work up has included unremarkable labs but a low fecal elastase, have been unable to find a low cost pancreatic enzyme supplement but he will try some Creon samples and if helps symptoms may purchase out of pocket. He also has levsin to use PRN which helps some. Associated w/ diarrhea but stool studies negative. He has not had any imaging since abd pain began and will schedule CT to exclude any underlying pathology. Recommended to keep food journal log to look for underlying food triggers.    Sigurd was seen today for abdominal pain.  Diagnoses and all orders for this visit:  Abdominal pain, epigastric -     CT Abdomen Pelvis W Contrast; Future  Nausea without vomiting -     CT Abdomen Pelvis W Contrast; Future  Low fecal elastase level    He will call  w/ update on symptom response to creon 36,000U samples.    I personally performed the service, non-incident to. (WP)  Laurine Blazer, [facility]

## 2020-07-13 NOTE — Telephone Encounter (Signed)
Note includes "worker's comp's 3rd party, "[employer]", is to be faxing a request for the MRI of right elbow, as patient relays worker's comp relays that the MRI order is to come from Dr [name]".  Dr [name] had also noted "repeat MRI right elbow.  Please advise.

## 2020-07-13 NOTE — Telephone Encounter (Signed)
I put in order

## 2020-07-13 NOTE — Telephone Encounter (Signed)
Both Dr Lemmie Evens and Dr Burney Gauze wanted MRI done I can order based on the notes from visits  He will need to review the MRI with Dr Burney Gauze  Dr Burney Gauze says in his note Impression is a 59 year old male who is almost 3 years status post primary procedure on lateral epicondylitis on his right elbow with persistent pain and possible radial tunnel like symptoms as well as a tremor of unknown origin. I discussed in great detail with the patient my findings and recommendations. Due to his persistent pain almost 3 years status post his primary procedure I would recommend a repeat MRI to ascertain the nature of his persistent pain  He also said based on the results, may require revision surgery.  This would be [facility] not Dr [name]

## 2020-07-13 NOTE — Telephone Encounter (Signed)
Today, 07/13/20, fax has been received in reference to this phone note to attention: Amy, from [place] at [facility], provider Joanne Chars, inquiring as to whether Dr [name] would agree to ordering the MRI as noted. If so, would another appointment be needed? Fax is in Dr [employer] box; copy retained in front office file. Please advise.

## 2020-07-15 NOTE — Telephone Encounter (Signed)
Called patient; relayed Dr [name] ordered the MRI; patient will call back with the Workers comp 3rd party who already has the approval, for MRI to receive order and be scheduled; aware Dr [name] will see him for results; will schedule.

## 2020-07-20 ENCOUNTER — Telehealth: Payer: Self-pay | Admitting: Orthopedic Surgery

## 2020-07-20 NOTE — Telephone Encounter (Signed)
Jared Griffin is working on this, it is [facility]

## 2020-07-20 NOTE — Telephone Encounter (Addendum)
Patient and his wife called today stating that his MRI order needs to be sent to his attorney's office.  His attorney is [employer] and development officer. Attention Dianna Rossetti. The phone number is [phone] and the fax number is [phone]

## 2020-07-21 NOTE — Telephone Encounter (Signed)
MRI orders faxed - see note in [place]. Patient aware.

## 2020-07-23 DIAGNOSIS — E1165 Type 2 diabetes mellitus with hyperglycemia: Secondary | ICD-10-CM | POA: Diagnosis not present

## 2020-08-01 ENCOUNTER — Telehealth (INDEPENDENT_AMBULATORY_CARE_PROVIDER_SITE_OTHER): Payer: Medicare Other | Admitting: Psychiatry

## 2020-08-01 ENCOUNTER — Other Ambulatory Visit: Payer: Self-pay

## 2020-08-01 ENCOUNTER — Encounter (HOSPITAL_COMMUNITY): Payer: Self-pay | Admitting: Psychiatry

## 2020-08-01 DIAGNOSIS — M79671 Pain in right foot: Secondary | ICD-10-CM | POA: Diagnosis not present

## 2020-08-01 DIAGNOSIS — M792 Neuralgia and neuritis, unspecified: Secondary | ICD-10-CM | POA: Diagnosis not present

## 2020-08-01 DIAGNOSIS — F321 Major depressive disorder, single episode, moderate: Secondary | ICD-10-CM

## 2020-08-01 DIAGNOSIS — M79672 Pain in left foot: Secondary | ICD-10-CM | POA: Diagnosis not present

## 2020-08-01 DIAGNOSIS — G5790 Unspecified mononeuropathy of unspecified lower limb: Secondary | ICD-10-CM | POA: Diagnosis not present

## 2020-08-01 MED ORDER — TRAZODONE HCL 50 MG PO TABS
50.0000 mg | ORAL_TABLET | Freq: Every evening | ORAL | 2 refills | Status: DC | PRN
Start: 2020-08-01 — End: 2020-11-02

## 2020-08-01 MED ORDER — CLONAZEPAM 0.5 MG PO TABS: 0.5000 mg | ORAL_TABLET | Freq: Three times a day (TID) | ORAL | 2 refills | Status: DC | PRN

## 2020-08-01 MED ORDER — SERTRALINE HCL 100 MG PO TABS
150.0000 mg | ORAL_TABLET | Freq: Every day | ORAL | 2 refills | Status: DC
Start: 2020-08-01 — End: 2020-11-02

## 2020-08-01 NOTE — Progress Notes (Signed)
Virtual Visit via Video Note  I connected with Jared Griffin on 08/01/20 at 11:20 AM EST by a video enabled telemedicine application and verified that I am speaking with the correct person using two identifiers.  Location: Patient: home Provider: home   I discussed the limitations of evaluation and management by telemedicine and the availability of in person appointments. The patient expressed understanding and agreed to proceed.    I discussed the assessment and treatment plan with the patient. The patient was provided an opportunity to ask questions and all were answered. The patient agreed with the plan and demonstrated an understanding of the instructions.   The patient was advised to call back or seek an in-person evaluation if the symptoms worsen or if the condition fails to improve as anticipated.  I provided 15 minutes of non-face-to-face time during this encounter.   [name], MD  [facility] MD/PA/NP OP Progress Note  08/01/2020 11:40 AM Jared Griffin  MRN:  [id number]  Chief Complaint:  Chief Complaint    Depression; Anxiety; Follow-up     HPI: This patient is a 59 year old married white male lives with his wife and  son in [place]. 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 zonebut had an arm injury at work and had to quit there as well.  The patient was referred by his primary physician, Dr. [name], for further assessment and treatment of anxiety and depression.  The patient states that he is become increasingly depressed and anxious over the last couple of years due to work stress. He stated that he was always under pressure to get more things done than he had time to do. 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 He got to the point of hating going to  work. Several years ago he had cardiac problems and had a stent placed. In January of this year he began developing dizzy spells chest pain headaches stomach aches. It got so bad that he was readmitted for cardiac catheterization but nothing new was found.  The patient was having difficulty sleeping constant worry and anxiety about his job sadness and anger. He was getting irritable with people at home. After talking to his primary care physician at length and decided to take him out on medical retirement. The patient has been on Wellbutrin SR 142mtwice a day for several weeks now. He had also been out in the past when his wife was sick. He has not seen any benefit from this but does feel tremendously better since he stopped working. He is also on Xanax 0.5 mg 3 times a day which he thinks has helped to some degree.  Since stopping working he's had a big turnaround. His sleep and energy have improved. 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 He is much less anxious and is no longer having the chest pain dizzy spells or stomachaches. He denies suicidal ideation or psychotic symptoms. He does not use drugs or alcohol  Return for follow-up after 3 months.  He continues to deal with chronic back and neck pain and may need to have surgery on his neck.  He is also having chronic headaches.  He is also dealing with abdominal pain which may be due to reduced amount of enzymes produced  by his pancreas.  He states that lately he is wanted to spend more time alone in avoid conflicts with extended family etc.  He states that he does not feel lonely or is sad when he is alone but it helps him to decompress.  He does not think the Xanax is working anymore for his anxiety so we will make a switch to clonazepam.  He does think the Zoloft is helping his depression and he denies suicidal ideation Visit Diagnosis:    ICD-10-CM   1. Moderate single current episode of major depressive disorder ([place])   F32.1     Past Psychiatric History: none  Past Medical History:  Past Medical History:  Diagnosis Date  . Anginal pain ([place])   . Anxiety   . COPD (chronic obstructive pulmonary disease) ([place])    very close to being diagnosed  . Coronary artery disease   . Coronary atherosclerosis of native coronary artery    a. 12/09/2013: s/p PCI with 3.0 x 28 Promus DES to mLAD  . Cyst (solitary) of breast 10/03/2017   removed from back  . Depression   . Diabetic neuropathy ([place])   . Dyspnea    take inhaler  . Elbow injury 07/25/2017   surgery for torn tendon  . Essential hypertension, benign   . Fatty liver disease, nonalcoholic   . Lateral epicondylitis of right elbow   . Mixed hyperlipidemia 2006  . Obesity   . Osgood-Schlatter's disease of right knee   . Type 2 diabetes mellitus ([place])     Past Surgical History:  Procedure Laterality Date  . BIOPSY  01/21/2019   Procedure: BIOPSY;  Surgeon: [name], MD;  [facility];  Service: Endoscopy;;  gastric bx and gastric polyps  . CARDIAC CATHETERIZATION  2011  . CARDIAC CATHETERIZATION N/A 09/20/2015   Procedure: Left Heart Cath and Coronary Angiography;  Surgeon: [name], MD;  Location: [facility];  Service: Cardiovascular;  Laterality: N/A;  . CHOLECYSTECTOMY  2003  . COLONOSCOPY  06/19/2012   Procedure: COLONOSCOPY;  Surgeon: [name], MD;  [facility];  Service: Endoscopy;  Laterality: N/A;  930  . COLONOSCOPY N/A 10/17/2017   Procedure: COLONOSCOPY;  Surgeon: [name], MD;  [facility];  Service: Endoscopy;  Laterality: N/A;  1225  . CYST EXCISION  07/2019  . ESOPHAGOGASTRODUODENOSCOPY (EGD) WITH PROPOFOL N/A 01/21/2019   Procedure: ESOPHAGOGASTRODUODENOSCOPY (EGD) WITH PROPOFOL;  Surgeon: [name], MD;  [facility];  Service: Endoscopy;  Laterality: N/A;  10:15am  . KNEE ARTHROPLASTY Right 1999  . LEFT HEART CATH AND CORONARY ANGIOGRAPHY N/A  02/26/2018   Procedure: LEFT HEART CATH AND CORONARY ANGIOGRAPHY;  Surgeon: [name], MD;  Location: [facility] [place] [facility];  Service: Cardiovascular;  Laterality: N/A;  . LEFT HEART CATHETERIZATION WITH CORONARY ANGIOGRAM N/A 12/09/2013   Procedure: LEFT HEART CATHETERIZATION WITH CORONARY ANGIOGRAM;  Surgeon: [name], MD;  Location: [facility] CATH LAB;  Service: Cardiovascular;  Laterality: N/A;  . Liver biopsy    . POLYPECTOMY  10/17/2017   Procedure: POLYPECTOMY;  Surgeon: [name], MD;  [facility];  Service: Endoscopy;;  ascending colon, splenic flexure,sigmoid x2  . TENNIS ELBOW RELEASE/NIRSCHEL PROCEDURE Right 07/25/2017   Procedure: TENNIS ELBOW RELEASE and debriedment;  Surgeon: [name], MD;  Location: [facility];  Service: Orthopedics;  Laterality: Right;  . TONSILLECTOMY  1970's    Family Psychiatric History:  see below  Family History:  Family History  Problem Relation Age of Onset  . Osteoporosis Mother   . Cancer Maternal Aunt   . CVA Maternal Grandmother   . CVA Maternal Grandfather   . Healthy Son     Social History:  Social History   Socioeconomic History  . Marital status: Married    Spouse name: Not on file  . Number of children: Not on file  . Years of education: Not on file  . Highest education level: Not on file  Occupational History  . Not on file  Tobacco Use  . Smoking status: Former Smoker    Packs/day: 0.50    Years: 10.00    Pack years: 5.00    Types: Cigarettes    Start date: 02/02/1973    Quit date: 09/17/1983    Years since quitting: 36.8  . Smokeless tobacco: Former Systems developer    Types: [place] date: 09/17/1983  Vaping Use  . Vaping Use: Never used  Substance and Sexual Activity  . Alcohol use: No    Alcohol/week: 0.0 standard drinks  . Drug use: No  . Sexual activity: Yes  Other Topics Concern  . 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    Married with 1 child   Right handed    12 th    Very little caffeine   Social Determinants of Radio broadcast assistant Strain:   . Difficulty of Paying Living Expenses: Not on file  Food Insecurity:   . Worried About Charity fundraiser in the Last Year: Not on file  . Ran Out of Food in the Last Year: Not on file  Transportation Needs:   . Lack of Transportation (Medical): Not on file  . Lack of Transportation (Non-Medical): Not on file  Physical Activity:   . Days of Exercise per Week: Not on file  . Minutes of Exercise per Session: Not on file  Stress:   . Feeling of Stress : Not on file  Social Connections:   . Frequency of Communication with Friends and Family: Not on file  . Frequency of Social Gatherings with Friends and Family: Not on file  . Attends Religious Services: Not on file  . Active Member of Clubs or Organizations: Not on file  . Attends Archivist Meetings: Not on file  . Marital Status: Not on file    Allergies:  Allergies  Allergen Reactions  . Metoprolol Rash    Metabolic Disorder Labs: Lab Results  Component Value Date   HGBA1C 7.5 (A) 12/24/2019   MPG 211.6 07/12/2018   MPG 197.25 07/23/2017   No results found for: PROLACTIN Lab Results  Component Value Date   CHOL 116 10/18/2018   TRIG 173 (H) 10/18/2018   HDL 30 (L) 10/18/2018   CHOLHDL 3.9 10/18/2018   VLDL UNABLE TO CALCULATE IF TRIGLYCERIDE OVER 400 mg/dL 07/12/2018   LDLCALC 51 10/18/2018   LDLCALC UNABLE TO CALCULATE IF TRIGLYCERIDE OVER 400 mg/dL 07/12/2018   Lab Results  Component Value Date   TSH 0.648 08/20/2019   TSH 0.65 08/20/2019    Therapeutic Level Labs: No results found for: LITHIUM No results found for: VALPROATE No components found for:  CBMZ  Current Medications: Current Outpatient Medications  Medication Sig Dispense Refill  . albuterol (VENTOLIN HFA) 108 (90 Base) MCG/ACT inhaler INHALE 2 PUFFS INTO  LUNGS  EVERY 6 HOURS AS NEEDED FOR WHEEZING AND SHORTNESS OF BREATH. 8.5 g 5  . ALPRAZolam (XANAX) 0.5 MG tablet Take 1 tablet (0.5 mg total) by mouth 3 (three) times daily as needed for sleep or anxiety. 90 tablet 2  . aspirin (ASPIR-LOW) 81 MG EC tablet Take 1 tablet (81 mg total) by mouth daily. 30 tablet 12  . atorvastatin (LIPITOR) 40 MG tablet TAKE (1) TABLET BY MOUTH ONCE DAILY. 90 tablet 0  . B-D UF III MINI PEN NEEDLES 31G X 5 MM MISC USE UP TO 4 TIMES DAILY WITH INSULIN AS DIRECTED. 150 each 5  . cetirizine (ZYRTEC) 10 MG tablet Take 1 tablet (10 mg total) by mouth daily. 30 tablet 0  . Cholecalciferol (VITAMIN D3) 125 MCG (5000 UT) CAPS Take 1 capsule (5,000 Units total) by mouth daily. (Patient not taking: Reported on 07/07/2020) 90 capsule 0  . clonazePAM (KLONOPIN) 0.5 MG tablet Take 1 tablet (0.5 mg total) by mouth 3 (three) times daily as needed for anxiety. 90 tablet 2  . Continuous Blood Gluc Sensor (FREESTYLE LIBRE 14 DAY SENSOR) MISC Inject 1 each into the skin every 14 (fourteen) days. Use as directed. 2 each 2  . diltiazem (CARDIZEM) 30 MG tablet TAKE (1) TABLET BY MOUTH TWICE DAILY. 180 tablet 0  . Exenatide ER (BYDUREON BCISE) 2 MG/0.85ML AUIJ Inject 1 each into the skin once a week.    Marland Kitchen FARXIGA 10 MG TABS tablet Take 10 mg by mouth daily.    . fluticasone (FLONASE) 50 MCG/ACT nasal spray Place 2 sprays into both nostrils daily. 16 g 0  . fluticasone-salmeterol (ADVAIR HFA) 115-21 MCG/ACT inhaler Inhale 2 puffs into the lungs 2 (two) times daily. 1 Inhaler 5  . gabapentin (NEURONTIN) 300 MG capsule Take 2 capsules (600 mg total) by mouth 2 (two) times daily. 120 capsule 5  . glucose blood (CONTOUR NEXT TEST) test strip 1 each by Other route 4 (four) times daily. Use as instructed 150 each 5  . HUMALOG KWIKPEN 100 UNIT/ML KwikPen Inject 25-40 Units into the skin 3 (three) times daily.     . hyoscyamine (LEVSIN SL) 0.125 MG SL tablet Place 1 tablet (0.125 mg total) under the tongue  every 4 (four) hours as needed. 30 tablet 0  . Lancets MISC 1 each by Does not apply route 4 (four) times daily. 150 each 5  . lidocaine (LIDODERM) 5 % Place 1 patch onto the skin daily. Place 1 patch to your are of most significant pain on your neck once per day as needed. Remove & Discard patch within 12 hours 30 patch 0  . losartan (COZAAR) 25 MG tablet TAKE (1) TABLET BY MOUTH ONCE DAILY. (Patient taking differently: Take 25 mg by mouth daily. ) 90 tablet 2  . metFORMIN (GLUCOPHAGE) 500 MG tablet TAKE 2 TABLETS BY MOUTH TWICE DAILY WITH A MEAL. 120 tablet 0  . nitroGLYCERIN (NITROSTAT) 0.4 MG SL tablet Place 1 tablet (0.4 mg total) under the tongue every 5 (five) minutes as needed. 25 tablet 3  . ondansetron (ZOFRAN) 4 MG tablet Take 1 tablet (4 mg total) by mouth every 8 (eight) hours as needed for nausea or vomiting. 30 tablet 1  . Pancrelipase, Lip-Prot-Amyl, (CREON) 24000-76000 units CPEP Take 2 capsules (48,000 Units total) by mouth 3 (three) times daily before meals. Take 2 capsule w/ meals and 1 capsule w/ snacks. 180 capsule 1  . pantoprazole (PROTONIX) 40 MG tablet Take 1 tablet (40 mg  total) by mouth 2 (two) times daily before a meal. TAKE (1) TABLET BY MOUTH ONCE DAILY. 60 tablet 3  . ranolazine (RANEXA) 1000 MG SR tablet TAKE (1) TABLET BY MOUTH TWICE DAILY. (Patient taking differently: Take 1,000 mg by mouth 2 (two) times daily. ) 60 tablet 11  . sertraline (ZOLOFT) 100 MG tablet Take 1.5 tablets (150 mg total) by mouth daily. 135 tablet 2  . sucralfate (CARAFATE) 1 g tablet Take 1 tablet (1 g total) by mouth 2 (two) times daily before a meal. 60 tablet 3  . tiZANidine (ZANAFLEX) 4 MG capsule Take 1 capsule (4 mg total) by mouth 3 (three) times daily as needed for muscle spasms. 21 capsule 0  . TOUJEO MAX SOLOSTAR 300 UNIT/ML Solostar Pen INJECT 130 UNITS INTO THE SKIN AT BEDTIME. (Patient taking differently: Inject 80 Units into the skin at bedtime. ) 12 mL 0  . traZODone (DESYREL) 50  MG tablet Take 1 tablet (50 mg total) by mouth at bedtime as needed. 30 tablet 2   No current facility-administered medications for this visit.     Musculoskeletal: Strength & Muscle Tone: within normal limits Gait & Station: normal Patient leans: N/A  Psychiatric Specialty Exam: Review of Systems  Musculoskeletal: Positive for back pain and neck pain.  Neurological: Positive for headaches.  Psychiatric/Behavioral: The patient is nervous/anxious.   All other systems reviewed and are negative.   There were no vitals taken for this visit.There is no height or weight on file to calculate BMI.  General Appearance: Casual and Fairly Groomed  Eye Contact:  good  Speech:  Clear and Coherent  Volume:  Normal  Mood:  Anxious  Affect:  Flat  Thought Process:  Goal Directed  Orientation:  Full (Time, Place, and Person)  Thought Content: Rumination   Suicidal Thoughts:  No  Homicidal Thoughts:  No  Memory:  Immediate;   Good Recent;   Good Remote;   Fair  Judgement:  Good  Insight:  Good  Psychomotor Activity:  Decreased  Concentration:  Concentration: Good and Attention Span: Good  Recall:  Good  Fund of Knowledge: Good  Language: Good  Akathisia:  No  Handed:  Right  AIMS (if indicated): not done  Assets:  Communication Skills Desire for Improvement Resilience Social Support Talents/Skills  ADL's:  Intact  Cognition: WNL  Sleep:  Fair   Screenings: GAD-7     Office Visit from 08/05/2018 in [facility]  Total GAD-7 Score 5    PHQ2-9     Office Visit from 12/09/2019 in [facility] Nutrition from 08/06/2018 in [facility] Office Visit from 08/05/2018 in [facility] Visit from 07/24/2018 in [facility] Office Visit from 12/02/2017 in [facility]  PHQ-2 Total Score 5 0 1 0 0  PHQ-9 Total Score 11 -- 3 -- 1       Assessment and Plan: This  patient is a 59 year old male with a history of depression anxiety and chronic pain.  For now he feels like his medication for depression is working so we will continue Zoloft 150 mg daily.  He will also continue trazodone 150 mg at bedtime for sleep and his Xanax will be discontinued in favor of clonazepam 0.5 mg 3 times daily as needed for anxiety.  He will return to see me in 3 months   [name], MD 08/01/2020, 11:40 AM

## 2020-08-02 ENCOUNTER — Ambulatory Visit (HOSPITAL_COMMUNITY)
Admission: RE | Admit: 2020-08-02 | Discharge: 2020-08-02 | Disposition: A | Discharge status: Home / Self Care | Payer: Medicare Other | Source: Ambulatory Visit | Attending: Gastroenterology | Admitting: Gastroenterology

## 2020-08-02 ENCOUNTER — Telehealth: Payer: Self-pay | Admitting: Physical Medicine and Rehabilitation

## 2020-08-02 ENCOUNTER — Other Ambulatory Visit: Payer: Self-pay

## 2020-08-02 DIAGNOSIS — R1013 Epigastric pain: Secondary | ICD-10-CM | POA: Insufficient documentation

## 2020-08-02 DIAGNOSIS — R109 Unspecified abdominal pain: Secondary | ICD-10-CM | POA: Diagnosis not present

## 2020-08-02 DIAGNOSIS — R110 Nausea: Secondary | ICD-10-CM | POA: Insufficient documentation

## 2020-08-02 DIAGNOSIS — K760 Fatty (change of) liver, not elsewhere classified: Secondary | ICD-10-CM | POA: Diagnosis not present

## 2020-08-02 LAB — POCT I-STAT CREATININE: Creatinine, Ser: 1.2 mg/dL (ref 0.61–1.24)

## 2020-08-02 MED ORDER — IOHEXOL 300 MG/ML  SOLN
100.0000 mL | Freq: Once | INTRAMUSCULAR | Status: AC | PRN
  Administered 2020-08-02: 100 mL via INTRAVENOUS

## 2020-08-02 NOTE — Telephone Encounter (Signed)
Left message #1 to reschedule OV and to remind patient to bring copy of MRI from [facility].

## 2020-08-02 NOTE — Telephone Encounter (Signed)
Patient called requesting a call back for a appt  For neck pains. Please call patient at [phone].

## 2020-08-03 ENCOUNTER — Other Ambulatory Visit (INDEPENDENT_AMBULATORY_CARE_PROVIDER_SITE_OTHER): Payer: Self-pay | Admitting: Gastroenterology

## 2020-08-03 MED ORDER — ONDANSETRON 8 MG PO TBDP: 8.0000 mg | ORAL_TABLET | Freq: Three times a day (TID) | ORAL | 1 refills | Status: DC | PRN

## 2020-08-03 NOTE — Progress Notes (Unsigned)
I called pt w/ results. Wasn't able to really see large benefit from Creon samples. Some days are worse than others, levsin can help abd pain some days.  He also has nausea and Phenergan helps but makes him very sleepy.  Zofran sent to Pharmacy, this may help with nausea and cause less fatigue.  Needs a follow-up appointment with either MD for continued symptoms-he is requesting first available appointment.

## 2020-08-04 ENCOUNTER — Encounter: Payer: Self-pay | Admitting: Cardiology

## 2020-08-04 NOTE — Progress Notes (Signed)
Cardiology Office Note  Date: 08/05/2020   ID: Jared, Griffin 1961/05/26, MRN [id number]  PCP:  [name], MD  Cardiologist:  Rozann Lesches, MD Electrophysiologist:  None   Chief Complaint  Patient presents with  . Cardiac follow-up    History of Present Illness: Jared Griffin is a 59 y.o. male former patient of Dr. Bronson Ing now presenting to establish follow-up with me.  I reviewed his records and updated the chart.  He was last assessed via telehealth encounter in November 2020.  He presents for routine visit.  Reports intermittent, brief, sharp chest discomfort at times.  Also dyspnea on exertion, NYHA class II-III.  He tells me that he was treated for COVID-19 earlier in the year, was experiencing episodes of palpitations and orthopnea back in April and May, this has been improving but not completely resolved.  His last echocardiogram was in 2019.  LVEF was normal at that time.  I personally reviewed his ECG today which shows normal sinus rhythm.  We went over his medications which are listed below.  He has a listed allergy to metoprolol described as rash.  He tells me however that this may have been related to a new deodorant he was using at the time, the rash was in his armpits only.  We discussed attempting to switch from Cardizem to bisoprolol.  His last cardiac catheterization was in June 2019 at which point LAD stent site was widely patent and he had otherwise only mild coronary atherosclerosis.  He is due for follow-up lipids.  Past Medical History:  Diagnosis Date  . Anxiety   . COPD (chronic obstructive pulmonary disease) ([place])   . Coronary atherosclerosis of native coronary artery    a. 12/09/2013: s/p PCI with 3.0 x 28 Promus DES to mLAD  . Depression   . Diabetic neuropathy ([facility])   . Elbow injury 07/25/2017   Surgery for torn tendon  . Essential hypertension   . Fatty liver disease, nonalcoholic   . Lateral epicondylitis of right  elbow   . Mixed hyperlipidemia   . Obesity   . Osgood-Schlatter's disease of right knee   . Skin cyst 10/03/2017   Removed from back  . Type 2 diabetes mellitus ([place])     Past Surgical History:  Procedure Laterality Date  . BIOPSY  01/21/2019   Procedure: BIOPSY;  Surgeon: [name], MD;  [facility];  Service: Endoscopy;;  gastric bx and gastric polyps  . CARDIAC CATHETERIZATION  2011  . CARDIAC CATHETERIZATION N/A 09/20/2015   Procedure: Left Heart Cath and Coronary Angiography;  Surgeon: [name], MD;  Location: [facility];  Service: Cardiovascular;  Laterality: N/A;  . CHOLECYSTECTOMY  2003  . COLONOSCOPY  06/19/2012   Procedure: COLONOSCOPY;  Surgeon: [name], MD;  [facility];  Service: Endoscopy;  Laterality: N/A;  930  . COLONOSCOPY N/A 10/17/2017   Procedure: COLONOSCOPY;  Surgeon: [name], MD;  [facility];  Service: Endoscopy;  Laterality: N/A;  1225  . CYST EXCISION  07/2019  . ESOPHAGOGASTRODUODENOSCOPY (EGD) WITH PROPOFOL N/A 01/21/2019   Procedure: ESOPHAGOGASTRODUODENOSCOPY (EGD) WITH PROPOFOL;  Surgeon: [name], MD;  [facility];  Service: Endoscopy;  Laterality: N/A;  10:15am  . KNEE ARTHROPLASTY Right 1999  . LEFT HEART CATH AND CORONARY ANGIOGRAPHY N/A 02/26/2018   Procedure: LEFT HEART CATH AND CORONARY ANGIOGRAPHY;  Surgeon: [name], MD;  Location:  [facility];  Service: Cardiovascular;  Laterality: N/A;  . LEFT HEART CATHETERIZATION WITH CORONARY ANGIOGRAM N/A 12/09/2013   Procedure: LEFT HEART CATHETERIZATION WITH CORONARY ANGIOGRAM;  Surgeon: [name], MD;  Location: [facility] CATH LAB;  Service: Cardiovascular;  Laterality: N/A;  . Liver biopsy    . POLYPECTOMY  10/17/2017   Procedure: POLYPECTOMY;  Surgeon: [name], MD;  [facility];  Service: Endoscopy;;  ascending colon, splenic flexure,sigmoid x2  . TENNIS ELBOW  RELEASE/NIRSCHEL PROCEDURE Right 07/25/2017   Procedure: TENNIS ELBOW RELEASE and debriedment;  Surgeon: [name], MD;  Location: [facility];  Service: Orthopedics;  Laterality: Right;  . TONSILLECTOMY  1970's    Current Outpatient Medications  Medication Sig Dispense Refill  . albuterol (VENTOLIN HFA) 108 (90 Base) MCG/ACT inhaler INHALE 2 PUFFS INTO LUNGS EVERY 6 HOURS AS NEEDED FOR WHEEZING AND SHORTNESS OF BREATH. 8.5 g 5  . ALPRAZolam (XANAX) 0.5 MG tablet Take 1 tablet (0.5 mg total) by mouth 3 (three) times daily as needed for sleep or anxiety. 90 tablet 2  . aspirin (ASPIR-LOW) 81 MG EC tablet Take 1 tablet (81 mg total) by mouth daily. 30 tablet 12  . atorvastatin (LIPITOR) 40 MG tablet TAKE (1) TABLET BY MOUTH ONCE DAILY. 90 tablet 0  . B-D UF III MINI PEN NEEDLES 31G X 5 MM MISC USE UP TO 4 TIMES DAILY WITH INSULIN AS DIRECTED. 150 each 5  . cetirizine (ZYRTEC) 10 MG tablet Take 1 tablet (10 mg total) by mouth daily. 30 tablet 0  . Cholecalciferol (VITAMIN D3) 125 MCG (5000 UT) CAPS Take 1 capsule (5,000 Units total) by mouth daily. 90 capsule 0  . clonazePAM (KLONOPIN) 0.5 MG tablet Take 1 tablet (0.5 mg total) by mouth 3 (three) times daily as needed for anxiety. 90 tablet 2  . Continuous Blood Gluc Sensor (FREESTYLE LIBRE 14 DAY SENSOR) MISC Inject 1 each into the skin every 14 (fourteen) days. Use as directed. 2 each 2  . Exenatide ER (BYDUREON BCISE) 2 MG/0.85ML AUIJ Inject 1 each into the skin once a week.    Marland Kitchen FARXIGA 10 MG TABS tablet Take 10 mg by mouth daily.    . fluticasone (FLONASE) 50 MCG/ACT nasal spray Place 2 sprays into both nostrils daily. 16 g 0  . fluticasone-salmeterol (ADVAIR HFA) 115-21 MCG/ACT inhaler Inhale 2 puffs into the lungs 2 (two) times daily. 1 Inhaler 5  . gabapentin (NEURONTIN) 300 MG capsule Take 2 capsules (600 mg total) by mouth 2 (two) times daily. 120 capsule 5  . glucose blood (CONTOUR NEXT TEST) test strip 1 each by Other route 4 (four)  times daily. Use as instructed 150 each 5  . HUMALOG KWIKPEN 100 UNIT/ML KwikPen Inject 25-40 Units into the skin 3 (three) times daily.     . hyoscyamine (LEVSIN SL) 0.125 MG SL tablet Place 1 tablet (0.125 mg total) under the tongue every 4 (four) hours as needed. 30 tablet 0  . Lancets MISC 1 each by Does not apply route 4 (four) times daily. 150 each 5  . lidocaine (LIDODERM) 5 % Place 1 patch onto the skin daily. Place 1 patch to your are of most significant pain on your neck once per day as needed. Remove & Discard patch within 12 hours 30 patch 0  . losartan (COZAAR) 25 MG tablet TAKE (1) TABLET BY MOUTH ONCE DAILY. (Patient taking differently: Take 25 mg by mouth daily. ) 90 tablet 2  .  metFORMIN (GLUCOPHAGE) 500 MG tablet TAKE 2 TABLETS BY MOUTH TWICE DAILY WITH A MEAL. 120 tablet 0  . nitroGLYCERIN (NITROSTAT) 0.4 MG SL tablet Place 1 tablet (0.4 mg total) under the tongue every 5 (five) minutes as needed. 25 tablet 3  . ondansetron (ZOFRAN ODT) 8 MG disintegrating tablet Take 1 tablet (8 mg total) by mouth every 8 (eight) hours as needed for nausea or vomiting. 30 tablet 1  . ondansetron (ZOFRAN) 4 MG tablet Take 1 tablet (4 mg total) by mouth every 8 (eight) hours as needed for nausea or vomiting. 30 tablet 1  . Pancrelipase, Lip-Prot-Amyl, (CREON) 24000-76000 units CPEP Take 2 capsules (48,000 Units total) by mouth 3 (three) times daily before meals. Take 2 capsule w/ meals and 1 capsule w/ snacks. 180 capsule 1  . pantoprazole (PROTONIX) 40 MG tablet Take 1 tablet (40 mg total) by mouth 2 (two) times daily before a meal. TAKE (1) TABLET BY MOUTH ONCE DAILY. 60 tablet 3  . ranolazine (RANEXA) 1000 MG SR tablet TAKE (1) TABLET BY MOUTH TWICE DAILY. (Patient taking differently: Take 1,000 mg by mouth 2 (two) times daily. ) 60 tablet 11  . sertraline (ZOLOFT) 100 MG tablet Take 1.5 tablets (150 mg total) by mouth daily. 135 tablet 2  . sucralfate (CARAFATE) 1 g tablet Take 1 tablet (1 g  total) by mouth 2 (two) times daily before a meal. 60 tablet 3  . tiZANidine (ZANAFLEX) 4 MG capsule Take 1 capsule (4 mg total) by mouth 3 (three) times daily as needed for muscle spasms. 21 capsule 0  . TOUJEO MAX SOLOSTAR 300 UNIT/ML Solostar Pen INJECT 130 UNITS INTO THE SKIN AT BEDTIME. (Patient taking differently: Inject 80 Units into the skin at bedtime. ) 12 mL 0  . traZODone (DESYREL) 50 MG tablet Take 1 tablet (50 mg total) by mouth at bedtime as needed. 30 tablet 2  . bisoprolol (ZEBETA) 5 MG tablet Take 1 tablet (5 mg total) by mouth daily. 90 tablet 2   No current facility-administered medications for this visit.   Allergies:  Metoprolol   ROS: No syncope.  Physical Exam: VS:  BP (!) 144/88   Pulse 82   Ht 6' 2"  (1.88 m)   Wt 269 lb 3.2 oz (122.1 kg)   SpO2 96%   BMI 34.56 kg/m , BMI Body mass index is 34.56 kg/m.  Wt Readings from Last 3 Encounters:  08/05/20 269 lb 3.2 oz (122.1 kg)  07/07/20 265 lb 3.2 oz (120.3 kg)  06/08/20 265 lb 14.4 oz (120.6 kg)    General: Patient appears comfortable at rest. HEENT: Conjunctiva and lids normal, wearing a mask. Neck: Supple, no elevated JVP or carotid bruits, no thyromegaly. Lungs: Clear to auscultation, nonlabored breathing at rest. Cardiac: Regular rate and rhythm, no S3 or significant systolic murmur, no pericardial rub. Abdomen: Soft, nontender, bowel sounds present. Extremities: No pitting edema, distal pulses 2+. Skin: Warm and dry. Musculoskeletal: No kyphosis. Neuropsychiatric: Alert and oriented x3, affect grossly appropriate.  ECG:  An ECG dated 02/05/2020 was personally reviewed today and demonstrated:  Normal sinus rhythm.  Recent Labwork: 08/20/2019: TSH 0.648 06/08/2020: ALT 41; AST 38; BUN 14; Hemoglobin 14.4; Platelets 164; Potassium 4.2; Sodium 137 08/02/2020: Creatinine, Ser 1.20     Component Value Date/Time   CHOL 116 10/18/2018 0903   TRIG 173 (H) 10/18/2018 0903   HDL 30 (L) 10/18/2018 0903    CHOLHDL 3.9 10/18/2018 0903   CHOLHDL 6.4 07/12/2018 3903  VLDL UNABLE TO CALCULATE IF TRIGLYCERIDE OVER 400 mg/dL 07/12/2018 0713   LDLCALC 51 10/18/2018 9030    Other Studies Reviewed Today:  Cardiac catheterization 02/26/2018:  Colon Flattery 2nd Diag to 2nd Diag lesion is 25% stenosed.  Prior LAD stent is widely patent with minimal restenosis.  Mid LAD-2 lesion is 25% stenosed.  Mid RCA lesion is 10% stenosed.  The left ventricular systolic function is normal.  LV end diastolic pressure is normal.  The left ventricular ejection fraction is 55-65% by visual estimate.  There is no aortic valve stenosis.  JL4 works well from the right wrist.   Continue aggressive secondary prevention.  Investigate noncardiac causes of chest pain.  Echocardiogram 07/12/2018: - Left ventricle: The cavity size was normal. Wall thickness was  increased in a pattern of moderate LVH. Systolic function was  normal. The estimated ejection fraction was in the range of 60%  to 65%. Wall motion was normal; there were no regional wall  motion abnormalities. The study is not technically sufficient to  allow evaluation of LV diastolic function.  - Aortic valve: Valve area (VTI): 4.17 cm^2. Valve area (Vmax):  5.11 cm^2. Valve area (Vmean): 5.16 cm^2.  - Atrial septum: No defect or patent foramen ovale was identified.  Assessment and Plan:  1.  CAD status post DES to the LAD in 2015, patent at angiography in 2019 otherwise with mild coronary atherosclerosis.  He has a history of recurrent chest pain over time, being treated for angina with medical therapy.  ECG is normal today.  I talked with him today about regular activity and goal of weight loss.  Continue aspirin, Lipitor, Cozaar, and Ranexa.  We will switch from Cardizem to bisoprolol starting at 5 mg daily.  Follow-up echocardiogram to ensure normal LVEF in the setting of his shortness of breath.  2.  Mixed hyperlipidemia, continues on Lipitor.   Last LDL was 51 in 2020.  He is due for follow-up lab work with his endocrinologist.  Medication Adjustments/Labs and Tests Ordered: Current medicines are reviewed at length with the patient today.  Concerns regarding medicines are outlined above.   Tests Ordered: Orders Placed This Encounter  Procedures  . EKG 12-Lead  . ECHOCARDIOGRAM COMPLETE    Medication Changes: Meds ordered this encounter  Medications  . bisoprolol (ZEBETA) 5 MG tablet    Sig: Take 1 tablet (5 mg total) by mouth daily.    Dispense:  90 tablet    Refill:  2    08/05/2020 NEW-stop diltiazem    Disposition:  Follow up 6 months in the [place] office.  Signed, Satira Sark, MD, [facility] 08/05/2020 8:43 AM    [facility], [place], [place] [address] Phone: [phone]; Fax: [phone]

## 2020-08-04 NOTE — Progress Notes (Signed)
Jared Griffin has a follow up appointment with Dr Jenetta Downer on Tuesday 08/16/20 at 3:00

## 2020-08-04 NOTE — Progress Notes (Signed)
Thanks

## 2020-08-05 ENCOUNTER — Encounter: Payer: Self-pay | Admitting: Cardiology

## 2020-08-05 ENCOUNTER — Ambulatory Visit (INDEPENDENT_AMBULATORY_CARE_PROVIDER_SITE_OTHER): Payer: Medicare Other | Admitting: Cardiology

## 2020-08-05 VITALS — BP 144/88 | HR 82 | Ht 74.0 in | Wt 269.2 lb

## 2020-08-05 DIAGNOSIS — I25119 Atherosclerotic heart disease of native coronary artery with unspecified angina pectoris: Secondary | ICD-10-CM

## 2020-08-05 DIAGNOSIS — I10 Essential (primary) hypertension: Secondary | ICD-10-CM | POA: Diagnosis not present

## 2020-08-05 DIAGNOSIS — R0602 Shortness of breath: Secondary | ICD-10-CM | POA: Diagnosis not present

## 2020-08-05 MED ORDER — BISOPROLOL FUMARATE 5 MG PO TABS: 5.0000 mg | ORAL_TABLET | Freq: Every day | ORAL | 2 refills | Status: DC

## 2020-08-05 NOTE — Telephone Encounter (Signed)
Left message #2

## 2020-08-05 NOTE — Patient Instructions (Addendum)
Medication Instructions:   Your physician has recommended you make the following change in your medication:   Stop diltiazem (cardizem)  Start bisoprolol 5 mg by mouth daily  Continue other medications the same  Labwork:  None  Testing/Procedures: Your physician has requested that you have an echocardiogram. Echocardiography is a painless test that uses sound waves to create images of your heart. It provides your doctor with information about the size and shape of your heart and how well your heart's chambers and valves are working. This procedure takes approximately one hour. There are no restrictions for this procedure.  Follow-Up:  Your physician recommends that you schedule a follow-up appointment in: 6 months.  Any Other Special Instructions Will Be Listed Below (If Applicable).  If you need a refill on your cardiac medications before your next appointment, please call your pharmacy.

## 2020-08-08 ENCOUNTER — Other Ambulatory Visit: Payer: Self-pay

## 2020-08-08 ENCOUNTER — Ambulatory Visit (INDEPENDENT_AMBULATORY_CARE_PROVIDER_SITE_OTHER): Payer: Medicare Other | Admitting: Family Medicine

## 2020-08-08 ENCOUNTER — Encounter: Payer: Self-pay | Admitting: Family Medicine

## 2020-08-08 VITALS — BP 118/80 | HR 89 | Temp 97.7°F | Ht 74.0 in | Wt 269.0 lb

## 2020-08-08 DIAGNOSIS — R251 Tremor, unspecified: Secondary | ICD-10-CM

## 2020-08-08 DIAGNOSIS — Z23 Encounter for immunization: Secondary | ICD-10-CM

## 2020-08-08 DIAGNOSIS — E1159 Type 2 diabetes mellitus with other circulatory complications: Secondary | ICD-10-CM | POA: Diagnosis not present

## 2020-08-08 DIAGNOSIS — R0600 Dyspnea, unspecified: Secondary | ICD-10-CM

## 2020-08-08 DIAGNOSIS — R0609 Other forms of dyspnea: Secondary | ICD-10-CM

## 2020-08-08 DIAGNOSIS — Z125 Encounter for screening for malignant neoplasm of prostate: Secondary | ICD-10-CM | POA: Diagnosis not present

## 2020-08-08 NOTE — Progress Notes (Signed)
Subjective:    Patient ID: Jared Griffin, male    DOB: 06/01/1961, 59 y.o.   MRN: [id number]  HPImed check up.   Having some dizziness. Been going on for about a year or longer. Has fell about 3 times due to dizziness. States he has spoke with multiple doctors about this and has had a scan for headaches a couple of months ago.  At times when this had some he gets very off balance and sometimes falls because of it.  Denies any headache with it.  Had the scan previously which was negative.  Having some sob. Saw cardiologist Dr. Domenic Griffin on 11/19 and cardizem was changed to bisoprolol. Pt has not started new med yet. States he will pick up today.  Patient does have underlying angina.  He is disabled.  Has underlying heart disease.  They switched him over to a beta-blocker hoping that this would help him  Sees diabetic specialist. Next appt is December 8th.  Will have a full work-up with blood work at that time and he will send [place] a copy  Also has tremors which seem to be getting worse making it very difficult for him to utilize his hands with eating and drinking. Patient also easily gets fatigued. Would like a flu vaccine today.   Review of Systems  Constitutional: Positive for fatigue. Negative for diaphoresis.  HENT: Negative for congestion and rhinorrhea.   Respiratory: Positive for shortness of breath. Negative for cough.   Cardiovascular: Negative for chest pain and leg swelling.  Gastrointestinal: Negative for abdominal pain and diarrhea.  Skin: Negative for color change and rash.  Neurological: Positive for tremors and weakness. Negative for dizziness and headaches.  Psychiatric/Behavioral: Negative for behavioral problems and confusion.       Objective:   Physical Exam Vitals reviewed.  Constitutional:      General: He is not in acute distress. HENT:     Head: Normocephalic and atraumatic.  Eyes:     General:        Right eye: No discharge.        Left eye: No  discharge.  Neck:     Trachea: No tracheal deviation.  Cardiovascular:     Rate and Rhythm: Normal rate and regular rhythm.     Heart sounds: Normal heart sounds. No murmur heard.   Pulmonary:     Effort: Pulmonary effort is normal. No respiratory distress.     Breath sounds: Normal breath sounds.  Lymphadenopathy:     Cervical: No cervical adenopathy.  Skin:    General: Skin is warm and dry.  Neurological:     Mental Status: He is alert.     Coordination: Coordination normal.     Comments: Tremors noted in both arms.  Negative cogwheel.  Psychiatric:        Behavior: Behavior normal.           Assessment & Plan:  1. DM type 2 causing vascular disease ([place]) Followed by endocrinology he will have the notes sent to [place]  2. Tremor Referral to neurology with neurologist who specializes in movement disorders beta-blocker should help to some degree - Ambulatory referral to Neurology  3. Screening for prostate cancer Screening PSA ordered - PSA  4. Need for vaccination Flu shot ordered - Flu Vaccine QUAD 6+ mos PF IM (Fluarix Quad PF)  5. DOE (dyspnea on exertion) More than likely related to the heart disease if he gets worse we will need to do pulmonary  work-up Patient to follow-up by spring time follow-up sooner problems

## 2020-08-08 NOTE — Telephone Encounter (Signed)
Rescheduled for 12/14 at 1000.

## 2020-08-09 ENCOUNTER — Other Ambulatory Visit: Payer: Self-pay

## 2020-08-09 ENCOUNTER — Ambulatory Visit (HOSPITAL_COMMUNITY)
Admission: RE | Admit: 2020-08-09 | Discharge: 2020-08-09 | Disposition: A | Discharge status: Home / Self Care | Payer: Medicare Other | Source: Ambulatory Visit | Attending: Cardiology | Admitting: Cardiology

## 2020-08-09 DIAGNOSIS — R0602 Shortness of breath: Secondary | ICD-10-CM | POA: Diagnosis not present

## 2020-08-09 DIAGNOSIS — I25119 Atherosclerotic heart disease of native coronary artery with unspecified angina pectoris: Secondary | ICD-10-CM | POA: Insufficient documentation

## 2020-08-09 LAB — ECHOCARDIOGRAM COMPLETE
Area-P 1/2: 3.21 cm2
S' Lateral: 3.1 cm

## 2020-08-09 LAB — PSA: Prostate Specific Ag, Serum: 0.4 ng/mL (ref 0.0–4.0)

## 2020-08-09 NOTE — Progress Notes (Signed)
*  PRELIMINARY RESULTS* Echocardiogram 2D Echocardiogram has been performed.  Jared Griffin 08/09/2020, 9:09 AM

## 2020-08-10 ENCOUNTER — Telehealth: Payer: Self-pay | Admitting: *Deleted

## 2020-08-10 NOTE — Telephone Encounter (Signed)
Laurine Blazer, LPN  07/37/1062 6:94 PM EST Back to Top    Notified, copy to pcp.

## 2020-08-10 NOTE — Telephone Encounter (Signed)
-----   Message from Satira Sark, MD sent at 08/09/2020 10:53 AM EST ----- Results reviewed.  LVEF remains normal at 60 to 65%.  Continue with current plan and follow-up.

## 2020-08-16 ENCOUNTER — Telehealth: Payer: Self-pay | Admitting: Radiology

## 2020-08-16 ENCOUNTER — Ambulatory Visit (INDEPENDENT_AMBULATORY_CARE_PROVIDER_SITE_OTHER): Payer: Medicare Other | Admitting: Gastroenterology

## 2020-08-16 NOTE — Telephone Encounter (Signed)
Roselyn Reef, Nurse Case Manager for [facility] called and left a message asking about this patient.  She s/w Amy on 07/13/20, and had faxed a 2nd op from Dr Burney Gauze.  Wanting to see if Dr [name] would want to see patient or order the recommended MRI.  Please call her to give status update- ph [phone], fax [phone].  Sending to you both as I am not sure who can offer updated info.  I do see appts/notes from Dr Ernestina Patches in [place] office as well in chart.

## 2020-08-16 NOTE — Telephone Encounter (Signed)
MRI was ordered We do not need to see him back he is to follow up with Dr Burney Gauze after MRI  Dr Ernestina Patches is seeing for unrelated problem.   Jared Griffin has been waiting for [facility] to approve and or schedule the MRI

## 2020-08-17 ENCOUNTER — Telehealth (INDEPENDENT_AMBULATORY_CARE_PROVIDER_SITE_OTHER): Payer: Self-pay

## 2020-08-17 ENCOUNTER — Other Ambulatory Visit: Payer: Self-pay

## 2020-08-17 ENCOUNTER — Encounter (INDEPENDENT_AMBULATORY_CARE_PROVIDER_SITE_OTHER): Payer: Self-pay

## 2020-08-17 ENCOUNTER — Other Ambulatory Visit (INDEPENDENT_AMBULATORY_CARE_PROVIDER_SITE_OTHER): Payer: Self-pay

## 2020-08-17 ENCOUNTER — Encounter (INDEPENDENT_AMBULATORY_CARE_PROVIDER_SITE_OTHER): Payer: Self-pay | Admitting: Gastroenterology

## 2020-08-17 ENCOUNTER — Ambulatory Visit (INDEPENDENT_AMBULATORY_CARE_PROVIDER_SITE_OTHER): Payer: Medicare Other | Admitting: Gastroenterology

## 2020-08-17 VITALS — BP 129/81 | HR 91 | Temp 97.9°F | Ht 74.0 in | Wt 264.0 lb

## 2020-08-17 DIAGNOSIS — R7989 Other specified abnormal findings of blood chemistry: Secondary | ICD-10-CM

## 2020-08-17 DIAGNOSIS — R1013 Epigastric pain: Secondary | ICD-10-CM

## 2020-08-17 DIAGNOSIS — K7581 Nonalcoholic steatohepatitis (NASH): Secondary | ICD-10-CM | POA: Diagnosis not present

## 2020-08-17 DIAGNOSIS — K529 Noninfective gastroenteritis and colitis, unspecified: Secondary | ICD-10-CM | POA: Insufficient documentation

## 2020-08-17 DIAGNOSIS — G8929 Other chronic pain: Secondary | ICD-10-CM | POA: Diagnosis not present

## 2020-08-17 MED ORDER — DICYCLOMINE HCL 10 MG PO CAPS: 10.0000 mg | ORAL_CAPSULE | Freq: Two times a day (BID) | ORAL | 5 refills | Status: DC

## 2020-08-17 MED ORDER — NA SULFATE-K SULFATE-MG SULF 17.5-3.13-1.6 GM/177ML PO SOLN: 354.0000 mL | Freq: Once | ORAL | 0 refills | Status: AC

## 2020-08-17 NOTE — Telephone Encounter (Signed)
Jared Griffin, CMA  

## 2020-08-17 NOTE — Progress Notes (Signed)
[name], M.D. Gastroenterology & Hepatology [facility] [address] [place], [place] [address]  Primary Care Physician: [name], [place] [id number]  I will communicate my assessment and recommendations to the referring MD via EMR. "Note: Occasional unusual wording and randomly placed punctuation marks may result from the use of speech recognition technology to transcribe this document"  Problems: 1. Epigastric and left upper quadrant abdominal pain 2. Chronic diarrhea  History of Present Illness: Jared Griffin is a 59 y.o. male with PMH COPD, CAD s/p stent, diabetes c/b neuropathy , HLD, NASH who presents for follow up of abdominal pain and chronic diarrhea.  The patient was last seen on 07/07/2020. At that time, the patient was ordered to have a CT of the abdomen as he had low levels of fecal elastase (184).  He was also given Creon samples, which he took but he did not feel any improvement of his diarrhea while taking it.  His CT scan showed presence of steatosis and some mild hepatosplenomegaly, but no presence of any pancreatic changes.  Patient reports that for the last 1.5 years he has presented recurrent episodes of epigastric and low retrosternal pain intermittently during the day, 2-3/10 in intensity, which does not radiate and is described as aching. He has also presented some stabbing pain episodes through his abdomen. Patient reports that he has pain even when sleeping, which wakes him up in the middle of the night. Has not felt any relief when taking Levsin. Patient reports he has also presented for the same amount of time 6-7 BMs per day watery or soft, regardless of the type of food he eats. He has felt when he eats certain things like spicy food or spaghetti he has worsening diarrhea (can have up to 10-11 BMs) and abdominal pain. Sometimes takes Imodium, sometimes 2 tablets  per day but not on daily basis. The patient denies having any nausea, vomiting, fever, chills, hematochezia, melena, hematemesis, jaundice, pruritus or weight loss.  Notably, the patient saw Dr. [name] recently who told him his symptoms were not related to a cardiac etiology.  Regarding our investigations he has had performed, the patient had normal ESR on 06/08/2020, normal CRP, negative C. difficile and GI path panel.  Last EGD: 2020 -presence of multiple 3 to 7 mm polyps in the gastric body (pathology showed fundic gland polyps), presence of erythema in the gastric body (pathology showed gastritis without presence of H. pylori.), normal duodenum and esophagus. Last Colonoscopy: 2019 -3 polyps removed in the sigmoid and ascending colon between 6 to 7 mm in size (pathology showed fragments of tubular adenoma with focal high-grade dysplasia), a 5 mm polyp was removed from the splenic flexure (pathology showed fragments of tubular adenoma with focal high-grade dysplasia), internal hemorrhoids.  There was a 12 mm lipoma at the hepatic flexure.  FHx: neg for any gastrointestinal/liver disease, no malignancies Social: neg smoking, alcohol or illicit drug use Surgical: non contributory  Past Medical History: Past Medical History:  Diagnosis Date  . Anxiety   . COPD (chronic obstructive pulmonary disease) ([place])   . Coronary atherosclerosis of native coronary artery    a. 12/09/2013: s/p PCI with 3.0 x 28 Promus DES to mLAD  . Depression   . Diabetic neuropathy ([place])   . Elbow injury 07/25/2017   Surgery for torn tendon  . Essential hypertension   . Fatty liver disease, nonalcoholic   . Lateral  epicondylitis of right elbow   . Mixed hyperlipidemia   . Obesity   . Osgood-Schlatter's disease of right knee   . Skin cyst 10/03/2017   Removed from back  . Type 2 diabetes mellitus ([place])     Past Surgical History: Past Surgical History:  Procedure Laterality Date  . BIOPSY  01/21/2019    Procedure: BIOPSY;  Surgeon: [name], MD;  [facility];  Service: Endoscopy;;  gastric bx and gastric polyps  . CARDIAC CATHETERIZATION  2011  . CARDIAC CATHETERIZATION N/A 09/20/2015   Procedure: Left Heart Cath and Coronary Angiography;  Surgeon: [name], MD;  Location: [place] CV LAB;  Service: Cardiovascular;  Laterality: N/A;  . CHOLECYSTECTOMY  2003  . COLONOSCOPY  06/19/2012   Procedure: COLONOSCOPY;  Surgeon: [name], MD;  [facility];  Service: Endoscopy;  Laterality: N/A;  930  . COLONOSCOPY N/A 10/17/2017   Procedure: COLONOSCOPY;  Surgeon: [name], MD;  [facility];  Service: Endoscopy;  Laterality: N/A;  1225  . CYST EXCISION  07/2019  . ESOPHAGOGASTRODUODENOSCOPY (EGD) WITH PROPOFOL N/A 01/21/2019   Procedure: ESOPHAGOGASTRODUODENOSCOPY (EGD) WITH PROPOFOL;  Surgeon: [name], MD;  [facility];  Service: Endoscopy;  Laterality: N/A;  10:15am  . KNEE ARTHROPLASTY Right 1999  . LEFT HEART CATH AND CORONARY ANGIOGRAPHY N/A 02/26/2018   Procedure: LEFT HEART CATH AND CORONARY ANGIOGRAPHY;  Surgeon: [name], MD;  Location: [place] CV LAB;  Service: Cardiovascular;  Laterality: N/A;  . LEFT HEART CATHETERIZATION WITH CORONARY ANGIOGRAM N/A 12/09/2013   Procedure: LEFT HEART CATHETERIZATION WITH CORONARY ANGIOGRAM;  Surgeon: [name], MD;  Location: [facility] CATH LAB;  Service: Cardiovascular;  Laterality: N/A;  . Liver biopsy    . POLYPECTOMY  10/17/2017   Procedure: POLYPECTOMY;  Surgeon: [name], MD;  [facility];  Service: Endoscopy;;  ascending colon, splenic flexure,sigmoid x2  . TENNIS ELBOW RELEASE/NIRSCHEL PROCEDURE Right 07/25/2017   Procedure: TENNIS ELBOW RELEASE and debriedment;  Surgeon: [name], MD;  Location: [facility];  Service: Orthopedics;  Laterality: Right;  . TONSILLECTOMY  1970's    Family History: Family History   Problem Relation Age of Onset  . Osteoporosis Mother   . Cancer Maternal Aunt   . CVA Maternal Grandmother   . CVA Maternal Grandfather   . Healthy Son     Social History: Social History   Tobacco Use  Smoking Status Former Smoker  . Packs/day: 0.50  . Years: 10.00  . Pack years: 5.00  . Types: Cigarettes  . Start date: 02/02/1973  . Quit date: 09/17/1983  . Years since quitting: 36.9  Smokeless Tobacco Former Systems developer  . Types: Chew  . Quit date: 09/17/1983   Social History   Substance and Sexual Activity  Alcohol Use No  . Alcohol/week: 0.0 standard drinks   Social History   Substance and Sexual Activity  Drug Use No    Allergies: Allergies  Allergen Reactions  . Metoprolol Rash    Medications: Current Outpatient Medications  Medication Sig Dispense Refill  . albuterol (VENTOLIN HFA) 108 (90 Base) MCG/ACT inhaler INHALE 2 PUFFS INTO LUNGS EVERY 6 HOURS AS NEEDED FOR WHEEZING AND SHORTNESS OF BREATH. 8.5 g 5  . aspirin (ASPIR-LOW) 81 MG EC tablet Take 1 tablet (81 mg total) by mouth daily. 30 tablet 12  . atorvastatin (LIPITOR) 40 MG tablet TAKE (1) TABLET BY MOUTH ONCE DAILY.  90 tablet 0  . B-D UF III MINI PEN NEEDLES 31G X 5 MM MISC USE UP TO 4 TIMES DAILY WITH INSULIN AS DIRECTED. 150 each 5  . bisoprolol (ZEBETA) 5 MG tablet Take 1 tablet (5 mg total) by mouth daily. 90 tablet 2  . cetirizine (ZYRTEC) 10 MG tablet Take 1 tablet (10 mg total) by mouth daily. 30 tablet 0  . clonazePAM (KLONOPIN) 0.5 MG tablet Take 1 tablet (0.5 mg total) by mouth 3 (three) times daily as needed for anxiety. 90 tablet 2  . Continuous Blood Gluc Sensor (FREESTYLE LIBRE 14 DAY SENSOR) MISC Inject 1 each into the skin every 14 (fourteen) days. Use as directed. 2 each 2  . Exenatide ER (BYDUREON BCISE) 2 MG/0.85ML AUIJ Inject 1 each into the skin once a week.    Marland Kitchen FARXIGA 10 MG TABS tablet Take 10 mg by mouth daily.    . fluticasone (FLONASE) 50 MCG/ACT nasal spray Place 2 sprays  into both nostrils daily. 16 g 0  . fluticasone-salmeterol (ADVAIR HFA) 115-21 MCG/ACT inhaler Inhale 2 puffs into the lungs 2 (two) times daily. 1 Inhaler 5  . gabapentin (NEURONTIN) 300 MG capsule Take 2 capsules (600 mg total) by mouth 2 (two) times daily. 120 capsule 5  . HUMALOG KWIKPEN 100 UNIT/ML KwikPen Inject 25-40 Units into the skin 3 (three) times daily.     . hyoscyamine (LEVSIN SL) 0.125 MG SL tablet Place 1 tablet (0.125 mg total) under the tongue every 4 (four) hours as needed. 30 tablet 0  . Lancets MISC 1 each by Does not apply route 4 (four) times daily. 150 each 5  . lidocaine (LIDODERM) 5 % Place 1 patch onto the skin daily. Place 1 patch to your are of most significant pain on your neck once per day as needed. Remove & Discard patch within 12 hours 30 patch 0  . losartan (COZAAR) 25 MG tablet TAKE (1) TABLET BY MOUTH ONCE DAILY. (Patient taking differently: Take 25 mg by mouth daily. ) 90 tablet 2  . metFORMIN (GLUCOPHAGE) 500 MG tablet TAKE 2 TABLETS BY MOUTH TWICE DAILY WITH A MEAL. 120 tablet 0  . nitroGLYCERIN (NITROSTAT) 0.4 MG SL tablet Place 1 tablet (0.4 mg total) under the tongue every 5 (five) minutes as needed. 25 tablet 3  . ondansetron (ZOFRAN ODT) 8 MG disintegrating tablet Take 1 tablet (8 mg total) by mouth every 8 (eight) hours as needed for nausea or vomiting. 30 tablet 1  . pantoprazole (PROTONIX) 40 MG tablet Take 1 tablet (40 mg total) by mouth 2 (two) times daily before a meal. TAKE (1) TABLET BY MOUTH ONCE DAILY. 60 tablet 3  . ranolazine (RANEXA) 1000 MG SR tablet TAKE (1) TABLET BY MOUTH TWICE DAILY. (Patient taking differently: Take 1,000 mg by mouth 2 (two) times daily. ) 60 tablet 11  . sertraline (ZOLOFT) 100 MG tablet Take 1.5 tablets (150 mg total) by mouth daily. 135 tablet 2  . sucralfate (CARAFATE) 1 g tablet Take 1 tablet (1 g total) by mouth 2 (two) times daily before a meal. 60 tablet 3  . tiZANidine (ZANAFLEX) 4 MG capsule Take 1 capsule (4  mg total) by mouth 3 (three) times daily as needed for muscle spasms. 21 capsule 0  . TOUJEO MAX SOLOSTAR 300 UNIT/ML Solostar Pen INJECT 130 UNITS INTO THE SKIN AT BEDTIME. (Patient taking differently: Inject 80 Units into the skin at bedtime. ) 12 mL 0  . traZODone (DESYREL) 50 MG  tablet Take 1 tablet (50 mg total) by mouth at bedtime as needed. 30 tablet 2  . Cholecalciferol (VITAMIN D3) 125 MCG (5000 UT) CAPS Take 1 capsule (5,000 Units total) by mouth daily. (Patient not taking: Reported on 08/17/2020) 90 capsule 0  . Pancrelipase, Lip-Prot-Amyl, (CREON) 24000-76000 units CPEP Take 2 capsules (48,000 Units total) by mouth 3 (three) times daily before meals. Take 2 capsule w/ meals and 1 capsule w/ snacks. (Patient not taking: Reported on 08/17/2020) 180 capsule 1   No current facility-administered medications for this visit.    Review of Systems: GENERAL: negative for malaise, night sweats HEENT: No changes in hearing or vision, no nose bleeds or other nasal problems. NECK: Negative for lumps, goiter, pain and significant neck swelling RESPIRATORY: Negative for cough, wheezing CARDIOVASCULAR: Negative for chest pain, leg swelling, palpitations, orthopnea GI: SEE HPI MUSCULOSKELETAL: Negative for joint pain or swelling, back pain, and muscle pain. SKIN: Negative for lesions, rash PSYCH: Negative for sleep disturbance, mood disorder and recent psychosocial stressors. HEMATOLOGY Negative for prolonged bleeding, bruising easily, and swollen nodes. ENDOCRINE: Negative for cold or heat intolerance, polyuria, polydipsia and goiter. NEURO: negative for tremor, gait imbalance, syncope and seizures. The remainder of the review of systems is noncontributory.   Physical Exam: BP 129/81 (BP Location: Left Arm, Patient Position: Sitting, Cuff Size: Large)   Pulse 91   Temp 97.9 F (36.6 C) (Oral)   Ht _0  (1.88 m)   Wt 264 lb (119.7 kg)   BMI 33.90 kg/m  GENERAL: The patient is AO x3, in no  acute distress. Obese. HEENT: Head is normocephalic and atraumatic. EOMI are intact. Mouth is well hydrated and without lesions. NECK: Supple. No masses LUNGS: Clear to auscultation. No presence of rhonchi/wheezing/rales. Adequate chest expansion HEART: RRR, normal s1 and s2. ABDOMEN: tender to palpation in RUQ and epigastric area, no guarding, no peritoneal signs, and nondistended. BS +. No masses. EXTREMITIES: Without any cyanosis, clubbing, rash, lesions or edema. NEUROLOGIC: AOx3, no focal motor deficit. SKIN: no jaundice, no rashes  Imaging/Labs: as above  I personally reviewed and interpreted the available labs, imaging and endoscopic files.  Impression and Plan: Jared Griffin is a 59 y.o. male with PMH COPD, CAD s/p stent, diabetes c/b neuropathy , HLD, NASH who presents for follow up of abdominal pain and chronic diarrhea.  The patient has had chronic symptoms that have been evaluated in the GI clinic, infectious etiologies such as C. difficile and bacteria have been ruled out.  I do not consider his symptoms are related to pancreatic insufficiency as his pancreatic imaging was normal and he did not respond to the use of pancreatic enzymes.  Other etiologies for chronic diarrhea and abdominal pain should be evaluated including celiac disease, for which I will order celiac serologies at this time.  We will perform colonoscopy with random colonic biopsies to rule out microscopic colitis.  It is very possible that his symptoms are related to functional etiology as he has not presented any red flag signs, he is currently taking Zoloft which could be making his diarrhea worse.  Consideration of adding a low dose TCA should be evaluated in the next encounter based on testing.  The patient was advised to implement a low FODMAP diet to improve his symptoms, as well as take Imodium as needed and Bentyl twice daily (was also advised to stop Levsin).  Finally, the patient had presence of mildly  elevated liver enzymes chronically, for which we will evaluate  for viral, autoimmune and metabolic etiologies, but most likely this is related to NASH based on body habitus, comorbidities and imaging.  - Check CBC, CMP, hepatitis A/B/C serologies, iron panel, ANA, AMA, ASMA, IgG, TTG IgA - Patient was counseled about the benefit of implementing a low FODMAP to improve symptoms and recurrent episodes. A dietary list was provided to the patient. Also, the patient was counseled about the benefit of avoiding stressing situations and potential environmental triggers leading to symptomatology. - Start Bentyl 10 mg BID, stop Levsin - Can take imodium daily to improve diarrhea, can take up to 4 tablets per day if needed - Schedule colonoscopy with random colonic biopsies  All questions were answered.      [name], MD Gastroenterology and Hepatology [facility]

## 2020-08-17 NOTE — Patient Instructions (Addendum)
Perform blood workup  Patient was counseled about the benefit of implementing a low FODMAP to improve symptoms and recurrent episodes. A dietary list was provided to the patient. Also, the patient was counseled about the benefit of avoiding stressing situations and potential environmental triggers leading to symptomatology. Start Bentyl 10 mg BID, stop Levsin Can take imodium daily to improve diarrhea, can take up to 4 tablets per day if needed Schedule colonoscopy

## 2020-08-19 LAB — COMPREHENSIVE METABOLIC PANEL
AG Ratio: 1.7 (calc) (ref 1.0–2.5)
ALT: 56 U/L — ABNORMAL HIGH (ref 9–46)
AST: 42 U/L — ABNORMAL HIGH (ref 10–35)
Albumin: 4.8 g/dL (ref 3.6–5.1)
Alkaline phosphatase (APISO): 49 U/L (ref 35–144)
BUN: 15 mg/dL (ref 7–25)
CO2: 22 mmol/L (ref 20–32)
Calcium: 10.4 mg/dL — ABNORMAL HIGH (ref 8.6–10.3)
Chloride: 104 mmol/L (ref 98–110)
Creat: 0.93 mg/dL (ref 0.70–1.33)
Globulin: 2.9 g/dL (calc) (ref 1.9–3.7)
Glucose, Bld: 101 mg/dL (ref 65–139)
Potassium: 4.2 mmol/L (ref 3.5–5.3)
Sodium: 140 mmol/L (ref 135–146)
Total Bilirubin: 0.7 mg/dL (ref 0.2–1.2)
Total Protein: 7.7 g/dL (ref 6.1–8.1)

## 2020-08-19 LAB — IRON, TOTAL/TOTAL IRON BINDING CAP
%SAT: 18 % (calc) — ABNORMAL LOW (ref 20–48)
Iron: 92 ug/dL (ref 50–180)
TIBC: 518 mcg/dL (calc) — ABNORMAL HIGH (ref 250–425)

## 2020-08-19 LAB — CBC WITH DIFFERENTIAL/PLATELET
Absolute Monocytes: 742 cells/uL (ref 200–950)
Basophils Absolute: 49 cells/uL (ref 0–200)
Basophils Relative: 0.7 %
Eosinophils Absolute: 168 cells/uL (ref 15–500)
Eosinophils Relative: 2.4 %
HCT: 46.4 % (ref 38.5–50.0)
Hemoglobin: 15.1 g/dL (ref 13.2–17.1)
Lymphs Abs: 2450 cells/uL (ref 850–3900)
MCH: 26.6 pg — ABNORMAL LOW (ref 27.0–33.0)
MCHC: 32.5 g/dL (ref 32.0–36.0)
MCV: 81.7 fL (ref 80.0–100.0)
MPV: 10.5 fL (ref 7.5–12.5)
Monocytes Relative: 10.6 %
Neutro Abs: 3591 cells/uL (ref 1500–7800)
Neutrophils Relative %: 51.3 %
Platelets: 211 10*3/uL (ref 140–400)
RBC: 5.68 10*6/uL (ref 4.20–5.80)
RDW: 14.3 % (ref 11.0–15.0)
Total Lymphocyte: 35 %
WBC: 7 10*3/uL (ref 3.8–10.8)

## 2020-08-19 LAB — IGA: Immunoglobulin A: 453 mg/dL — ABNORMAL HIGH (ref 47–310)

## 2020-08-19 LAB — ANA: Anti Nuclear Antibody (ANA): NEGATIVE

## 2020-08-19 LAB — HEPATITIS B SURFACE ANTIGEN: Hepatitis B Surface Ag: NONREACTIVE

## 2020-08-19 LAB — TISSUE TRANSGLUTAMINASE, IGA: (tTG) Ab, IgA: <1 U/mL

## 2020-08-19 LAB — HEPATITIS B SURFACE ANTIBODY,QUALITATIVE: Hep B S Ab: BORDERLINE — AB

## 2020-08-19 LAB — HEPATITIS A ANTIBODY, TOTAL: Hepatitis A AB,Total: NONREACTIVE

## 2020-08-19 LAB — HEPATITIS B CORE ANTIBODY, TOTAL: Hep B Core Total Ab: NONREACTIVE

## 2020-08-19 LAB — HEPATITIS C ANTIBODY
Hepatitis C Ab: NONREACTIVE
SIGNAL TO CUT-OFF: 0.01 (ref <1.00)

## 2020-08-19 LAB — IGG: IgG (Immunoglobin G), Serum: 707 mg/dL (ref 600–1640)

## 2020-08-19 LAB — FERRITIN: Ferritin: 22 ng/mL — ABNORMAL LOW (ref 38–380)

## 2020-08-19 LAB — ANTI-SMOOTH MUSCLE ANTIBODY, IGG: Actin (Smooth Muscle) Antibody (IGG): <20 U (ref <20)

## 2020-08-19 LAB — MITOCHONDRIAL ANTIBODIES: Mitochondrial M2 Ab, IgG: <=20 U

## 2020-08-22 DIAGNOSIS — E1165 Type 2 diabetes mellitus with hyperglycemia: Secondary | ICD-10-CM | POA: Diagnosis not present

## 2020-08-23 NOTE — Telephone Encounter (Signed)
On 08/17/20, I had left a message for Foster Simpson, case manager at [place], 3rd party for [employer], ph [phone] + toll [phone], to discuss status of MRI, which we had faxed the order of 07/13/20.  I also called patient, and he states he has heard nothing about the MRI.   - On 08/23/20, Foster Simpson left a voice msg to request that we RE-FAX the MRI order to (same) fax# [phone] and CC to attorney's office Richard Manger ph [phone]/ITU [phone]. (Re-)Done as request. Patient aware.

## 2020-08-29 DIAGNOSIS — G5790 Unspecified mononeuropathy of unspecified lower limb: Secondary | ICD-10-CM | POA: Diagnosis not present

## 2020-08-29 DIAGNOSIS — M79672 Pain in left foot: Secondary | ICD-10-CM | POA: Diagnosis not present

## 2020-08-29 DIAGNOSIS — M792 Neuralgia and neuritis, unspecified: Secondary | ICD-10-CM | POA: Diagnosis not present

## 2020-08-29 DIAGNOSIS — M79671 Pain in right foot: Secondary | ICD-10-CM | POA: Diagnosis not present

## 2020-08-30 ENCOUNTER — Telehealth: Payer: Self-pay | Admitting: Physical Medicine and Rehabilitation

## 2020-08-30 ENCOUNTER — Encounter: Payer: Self-pay | Admitting: Physical Medicine and Rehabilitation

## 2020-08-30 ENCOUNTER — Ambulatory Visit: Payer: Medicare Other | Admitting: Physical Medicine and Rehabilitation

## 2020-08-30 ENCOUNTER — Other Ambulatory Visit: Payer: Self-pay

## 2020-08-30 VITALS — BP 134/88 | HR 95

## 2020-08-30 DIAGNOSIS — M542 Cervicalgia: Secondary | ICD-10-CM | POA: Diagnosis not present

## 2020-08-30 DIAGNOSIS — M47812 Spondylosis without myelopathy or radiculopathy, cervical region: Secondary | ICD-10-CM | POA: Diagnosis not present

## 2020-08-30 DIAGNOSIS — M5010 Cervical disc disorder with radiculopathy, unspecified cervical region: Secondary | ICD-10-CM | POA: Diagnosis not present

## 2020-08-30 DIAGNOSIS — M5481 Occipital neuralgia: Secondary | ICD-10-CM

## 2020-08-30 DIAGNOSIS — M7918 Myalgia, other site: Secondary | ICD-10-CM

## 2020-08-30 NOTE — Progress Notes (Signed)
Severe headaches. Neck pain and pain in back of head. MRI at [facility] in September.  Has been to the hospital in the last few months for treatment for pain. Numeric Pain Rating Scale and Functional Assessment Average Pain 10 Pain Right Now 2 My pain is intermittent, burning and aching Pain is worse with: lying in certain positions Pain improves with: medication   In the last MONTH (on 0-10 scale) has pain interfered with the following?  1. General activity like being  able to carry out your everyday physical activities such as walking, climbing stairs, carrying groceries, or moving a chair?  Rating(8)  2. Relation with others like being able to carry out your usual social activities and roles such as  activities at home, at work and in your community. Rating(8)  3. Enjoyment of life such that you have  been bothered by emotional problems such as feeling anxious, depressed or irritable?  Rating(7)

## 2020-08-30 NOTE — Telephone Encounter (Signed)
Pt not req Auth#. 

## 2020-08-30 NOTE — Telephone Encounter (Signed)
Is auth needed for C7-T1 [place]? Scheduled for 1/17.

## 2020-09-01 ENCOUNTER — Other Ambulatory Visit: Payer: Self-pay | Admitting: Family Medicine

## 2020-09-01 NOTE — Telephone Encounter (Signed)
Med check up 08/07/20

## 2020-09-01 NOTE — Progress Notes (Signed)
Jared Griffin - 59 y.o. male MRN [id number]  Date of birth: [birth year]  Office Visit Note: Visit Date: 08/30/2020 PCP: [name], MD Referred by: Erven Colla, DO  Subjective: Chief Complaint  Patient presents with   Neck - Pain   Head - Pain   HPI: Jared Griffin is a 59 y.o. male who comes in today For evaluation and management of chronic worsening recalcitrant 10 out of 10 neck pain and occipital headache pain.  I have seen the patient in the past at the request of Dr. Arther Abbott at [facility].  I have seen him for electrodiagnostic study of his upper limbs as well as low back pain status post injection.  He has a history of chronic pain syndrome really pain in the lower back upper back neck etc.  He reports an intermittent burning and aching pain in the neck referring up into the occipital head with pain in the back of the head.  He has been followed from a neurology standpoint by Dr. Wells Guiles Tat who is a movement disorder specialist at [facility] neurology.  Patient was having some tremor etc.  She did not feel like he had Parkinson's.  She did look into his headaches to some degree but she reported that that was not her specialty.  She did order MRI of the cervical spine which we reviewed today with images and spine models.  The report is below.  Patient has not seen a headache specialist specifically.  He has no history of migraine headaches.  He has not had occipital nerve block.  He reports being in the hospital for the last few months for the treatment of pain in general.  His case is medically complicated by history of diabetes with diabetic neuropathy, COPD, nonalcoholic fatty liver and anxiety disorder.  MRI does show paracentral disc protrusions herniations at C6-7 and C7-T1 but nothing in the upper spine.  He has not had specific physical therapy of the cervical spine or upper back.  Review of Systems  Musculoskeletal:  Positive for back pain,  joint pain and neck pain.  Neurological:  Positive for tingling, tremors and headaches.  All other systems reviewed and are negative. Otherwise per HPI.  Assessment & Plan: Visit Diagnoses:    ICD-10-CM   1. Bilateral occipital neuralgia  M54.81 Ambulatory referral to Physical Therapy    2. Cervicalgia  M54.2 Ambulatory referral to Physical Therapy    3. Cervical disc disorder with radiculopathy  M50.10     4. Cervical spondylosis without myelopathy  M47.812     5. Myofascial pain syndrome  M79.18 Ambulatory referral to Physical Therapy       Plan: Findings:  Chronic worsening severe occipital headache somewhat consistent with occipital neuralgia and somewhat consistent with myofascial trigger points which could be a trigger.  Cervical spine MRI significant for small disc herniations paracentral left and right in the lower cervical spine.  No radicular complaints down the arms.  At this point I think the best approach is to try physical therapy for several sessions including dry needling particularly in the paraspinal cervical region trapezius and levator scapula as this can be a referral pain causing his severe headaches.  Depending on relief with that would look at potentially C2-3, T ON nerve blocks with goal towards radiofrequency ablation if it were helping.  Would consider cervical epidural injection to be warranted as potentially being a underlying source of pain and then he is getting a referral  pain more in the back of his head exacerbated by anxiety disorder and myofascial pain.  Consider referral to Dr. Sarina Ill, MD for headache management.   Meds & Orders: No orders of the defined types were placed in this encounter.   Orders Placed This Encounter  Procedures   Ambulatory referral to Physical Therapy    Follow-up: No follow-ups on file.   Procedures: No procedures performed      Clinical History: Rozanna Box, MD - 05/26/2020  Formatting of this note might be  different from the original.  EXAMINATION: MRI cervical spine   CLINICAL INDICATION:Severe headaches, neck pain, pain at the base of the skull   TECHNIQUE: MRI cervical spine without contrast.   COMPARISON:None   FINDINGS:   The bone marrow signal is normal.   The craniocervical junction is normal.   The cervical spinal cord is normal.   C2-C3: Normal   C3-C4: Normal   C4-C5: There is a uncovertebral spur on the left. The facet joints are normal. The disc height is preserved. There is a moderate left neuroforaminal stenosis.   C5-C6: The disc height is preserved. There are bilateral uncovertebral spurs. The facet joints are normal. There is moderate bilateral neuroforaminal stenosis.   C6-C7: The disc height is preserved. There is a disc bulge with left paracentral disc herniation. The disc herniation effaces the thecal sac on the left but does not compress the cord or nerve root. The facet joints are normal. There is no significant foraminal stenosis.   C7-T1: The disc height is preserved. There is a right paracentral disc bulge which partially effaces the thecal sac on the right. It does not compress the cord or nerve root. The facet joints are normal. No foraminal stenosis.    IMPRESSION:  1. No abnormality in the region of the skull base is identified.  2. Moderate left neuroforaminal stenosis at C4-5 due to an uncovertebral spur  3. Moderate bilateral neuroforaminal stenosis at C5-C6 due to bilateral uncovertebral spurs  4. Left paracentral disc herniation at C6-7 which effaces the thecal sac on the left but does not compress the cord or nerve root.  5. Right paracentral disc bulge at C7-T1 which partially effaces the thecal sac on the right. No nerve root compression.     Electronically Signed by: Michael Boston Exam End: 05/26/20 12:54   He reports that he quit smoking about 37 years ago. His smoking use included cigarettes. He started smoking about 48 years ago. He has  a 5.00 pack-year smoking history. He quit smokeless tobacco use about 37 years ago.  His smokeless tobacco use included chew.  Recent Labs    10/18/20 0000  HGBA1C 6.6    Objective:  VS:  HT:     WT:    BMI:      BP:134/88   HR:95bpm   TEMP: ( )   RESP:  Physical Exam Vitals and nursing note reviewed.  Constitutional:      General: He is not in acute distress.    Appearance: Normal appearance. He is not ill-appearing.  HENT:     Head: Normocephalic and atraumatic.     Right Ear: External ear normal.     Left Ear: External ear normal.  Eyes:     Extraocular Movements: Extraocular movements intact.  Cardiovascular:     Rate and Rhythm: Normal rate.     Pulses: Normal pulses.  Abdominal:     General: There is no distension.  Palpations: Abdomen is soft.  Musculoskeletal:        General: Tenderness present. No signs of injury.     Cervical back: Neck supple. Tenderness present. No rigidity.     Right lower leg: No edema.     Left lower leg: No edema.     Comments: Patient has good strength in the upper extremities with 5 out of 5 strength in wrist extension long finger flexion APB.  No intrinsic hand muscle atrophy.  Negative Hoffmann's test.  Patient has pain with Spurling's but nothing down the arms.  He does have active trigger points in the levator scapula rhomboid trapezius and cervical paraspinal.  No real pain over the occipital grooves.  Tenderness and tender points.  He does have shoulder impingement on the right and not the left but this does not reproduce normal pain for him.  Lymphadenopathy:     Cervical: No cervical adenopathy.  Skin:    Findings: No erythema or rash.  Neurological:     General: No focal deficit present.     Mental Status: He is alert and oriented to person, place, and time.     Cranial Nerves: Cranial nerve deficit present.     Sensory: No sensory deficit.     Motor: No weakness or abnormal muscle tone.     Coordination: Coordination normal.      Gait: Gait normal.  Psychiatric:        Mood and Affect: Mood normal.        Behavior: Behavior normal.    Ortho Exam  Imaging: No results found.  Past Medical/Family/Surgical/Social History: Medications & Allergies reviewed per EMR, new medications updated. Patient Active Problem List   Diagnosis Date Noted   Orthostatic hypotension 12/07/2020   IBS (irritable bowel syndrome) 11/17/2020   Chronic diarrhea 08/17/2020   NASH (nonalcoholic steatohepatitis) 08/17/2020   Elevated LFTs 08/17/2020   Tremor of both hands 05/06/2020   Bilateral occipital neuralgia 05/06/2020   Abdominal pain, chronic, epigastric 01/13/2019   Anxiety 01/04/2019   Radicular pain of left lower extremity 12/22/2018   Moderate persistent asthma 10/25/2018   Dizziness 07/12/2018   Nonintractable headache    Abnormal nuclear stress test    Aftercare following surgery 07/25/17 08/13/2017   Hx of colonic polyps 08/07/2017   Lateral epicondylitis of right elbow    Cervical nerve root impingement 12/09/2015   Generalized anxiety disorder 09/27/2015   Chest pain 09/20/2015   Pain in the chest    Depression 01/24/2015   Esophageal reflux 01/24/2015   Preoperative cardiovascular examination 06/28/2014   DM type 2 causing vascular disease ([place])    Mixed hyperlipidemia    Obesity    Intermediate coronary syndrome ([place]) 12/09/2013   Lumbago 05/21/2012   Essential hypertension, benign 12/28/2009   CAD S/P LAD DES March 2015 12/28/2009   Hyperlipidemia LDL goal <70 11/25/2009   Accelerating angina ([place]) 11/25/2009   DM 11/24/2009   ABDOMINAL PAIN, HX OF 11/24/2009   Past Medical History:  Diagnosis Date   Anxiety    COPD (chronic obstructive pulmonary disease) ([place])    Coronary atherosclerosis of native coronary artery    a. 12/09/2013: s/p PCI with 3.0 x 28 Promus DES to mLAD   Depression    Diabetic neuropathy ([place])    Elbow injury 07/25/2017   Surgery for torn tendon   Essential hypertension     Fatty liver disease, nonalcoholic    GERD (gastroesophageal reflux disease)    Lateral epicondylitis of right  elbow    Mixed hyperlipidemia    Obesity    Osgood-Schlatter's disease of right knee    Skin cyst 10/03/2017   Removed from back   Type 2 diabetes mellitus ([place])    Family History  Problem Relation Age of Onset   Osteoporosis Mother    Cancer Maternal Aunt    CVA Maternal Grandmother    CVA Maternal Grandfather    Healthy Son    Past Surgical History:  Procedure Laterality Date   BIOPSY  01/21/2019   Procedure: BIOPSY;  Surgeon: [name], MD;  [facility];  Service: Endoscopy;;  gastric bx and gastric polyps   BIOPSY  11/08/2020   Procedure: BIOPSY;  Surgeon: [name], MD;  [facility]: [facility];  Service: Gastroenterology;;   CARDIAC CATHETERIZATION  2011   CARDIAC CATHETERIZATION N/A 09/20/2015   Procedure: Left Heart Cath and Coronary Angiography;  Surgeon: [name], MD;  Location: [place] [facility];  Service: Cardiovascular;  Laterality: N/A;   CHOLECYSTECTOMY  2003   COLONOSCOPY  06/19/2012   Procedure: COLONOSCOPY;  Surgeon: [name], MD;  [facility];  Service: Endoscopy;  Laterality: N/A;  930   COLONOSCOPY N/A 10/17/2017   Procedure: COLONOSCOPY;  Surgeon: [name], MD;  [facility];  Service: Endoscopy;  Laterality: N/A;  1225   COLONOSCOPY WITH PROPOFOL N/A 11/08/2020   Procedure: COLONOSCOPY WITH PROPOFOL;  Surgeon: [name], MD;  [facility];  Service: Gastroenterology;  Laterality: N/A;  10:45   CYST EXCISION  07/2019   ESOPHAGOGASTRODUODENOSCOPY (EGD) WITH PROPOFOL N/A 01/21/2019   Procedure: ESOPHAGOGASTRODUODENOSCOPY (EGD) WITH PROPOFOL;  Surgeon: [name], MD;  [facility];  Service: Endoscopy;  Laterality: N/A;  10:15am   ESOPHAGOGASTRODUODENOSCOPY (EGD) WITH PROPOFOL N/A 02/17/2021   Procedure: ESOPHAGOGASTRODUODENOSCOPY  (EGD) WITH PROPOFOL;  Surgeon: [name], MD;  [facility];  Service: Gastroenterology;  Laterality: N/A;  10:20   KNEE ARTHROPLASTY Right 1999   LEFT HEART CATH AND CORONARY ANGIOGRAPHY N/A 02/26/2018   Procedure: LEFT HEART CATH AND CORONARY ANGIOGRAPHY;  Surgeon: [name], MD;  Location: [place] [facility];  Service: Cardiovascular;  Laterality: N/A;   LEFT HEART CATHETERIZATION WITH CORONARY ANGIOGRAM N/A 12/09/2013   Procedure: LEFT HEART CATHETERIZATION WITH CORONARY ANGIOGRAM;  Surgeon: [name], MD;  Location: [facility];  Service: Cardiovascular;  Laterality: N/A;   Liver biopsy     POLYPECTOMY  10/17/2017   Procedure: POLYPECTOMY;  Surgeon: [name], MD;  [facility];  Service: Endoscopy;;  ascending colon, splenic flexure,sigmoid x2   POLYPECTOMY  11/08/2020   Procedure: POLYPECTOMY;  Surgeon: [name], MD;  [facility];  Service: Gastroenterology;;   TENNIS ELBOW RELEASE/NIRSCHEL PROCEDURE Right 07/25/2017   Procedure: TENNIS ELBOW RELEASE and debriedment;  Surgeon: [name], MD;  Location: [facility];  Service: Orthopedics;  Laterality: Right;   TONSILLECTOMY  1970's   Social History   Occupational History   Not on file  Tobacco Use   Smoking status: Former    Packs/day: 0.50    Years: 10.00    Pack years: 5.00    Types: Cigarettes    Start date: 02/02/1973    Quit date: 09/17/1983    Years since quitting: 37.4   Smokeless tobacco: Former    Types: Chew    Quit date: 09/17/1983  Vaping Use   Vaping  Use: Never used  Substance and Sexual Activity   Alcohol use: No    Alcohol/week: 0.0 standard drinks   Drug use: No   Sexual activity: Yes

## 2020-09-02 ENCOUNTER — Telehealth: Payer: Medicare Other | Admitting: Neurology

## 2020-09-14 ENCOUNTER — Telehealth: Payer: Self-pay | Admitting: Orthopedic Surgery

## 2020-09-19 ENCOUNTER — Telehealth (INDEPENDENT_AMBULATORY_CARE_PROVIDER_SITE_OTHER): Payer: Self-pay | Admitting: Gastroenterology

## 2020-09-19 NOTE — Telephone Encounter (Signed)
Patient left voice mail message regarding his colonoscopy on 1/5  - please advise - ph# [phone]

## 2020-09-19 NOTE — Telephone Encounter (Signed)
Noted, Jared Griffin canceled his Tcs due to his wife having medical issues at this time

## 2020-09-19 NOTE — Telephone Encounter (Signed)
Thanks

## 2020-09-20 ENCOUNTER — Encounter: Payer: Self-pay | Admitting: Physical Therapy

## 2020-09-20 ENCOUNTER — Other Ambulatory Visit: Payer: Self-pay

## 2020-09-20 ENCOUNTER — Ambulatory Visit: Payer: Medicare Other | Admitting: Physical Therapy

## 2020-09-20 ENCOUNTER — Other Ambulatory Visit (HOSPITAL_COMMUNITY)
Admission: RE | Admit: 2020-09-20 | Discharge: 2020-09-20 | Disposition: A | Discharge status: Home / Self Care | Payer: Medicare Other | Source: Ambulatory Visit | Attending: Physical Medicine and Rehabilitation | Admitting: Physical Medicine and Rehabilitation

## 2020-09-20 DIAGNOSIS — M6281 Muscle weakness (generalized): Secondary | ICD-10-CM

## 2020-09-20 DIAGNOSIS — R29898 Other symptoms and signs involving the musculoskeletal system: Secondary | ICD-10-CM

## 2020-09-20 DIAGNOSIS — M542 Cervicalgia: Secondary | ICD-10-CM

## 2020-09-20 NOTE — Patient Instructions (Signed)
Access Code: [email or web address] URL: [email or web address] Date: 09/20/2020 Prepared by: Faustino Congress  Exercises Seated Upper Trapezius Stretch - 2 x daily - 7 x weekly - 3 reps - 1 sets - 30 sec hold Seated Levator Scapulae Stretch - 2 x daily - 7 x weekly - 1 reps - 1 sets - 30 sec hold Supine Suboccipital Release with Tennis Balls - 1 x daily - 7 x weekly - 3-5 min hold  Patient Education Trigger Point Dry Needling

## 2020-09-21 ENCOUNTER — Encounter (HOSPITAL_COMMUNITY): Admission: RE | Payer: Self-pay | Source: Home / Self Care

## 2020-09-21 ENCOUNTER — Ambulatory Visit (HOSPITAL_COMMUNITY): Admission: RE | Admit: 2020-09-21 | Payer: Medicare Other | Source: Home / Self Care | Admitting: Gastroenterology

## 2020-09-21 SURGERY — COLONOSCOPY WITH PROPOFOL
Anesthesia: Monitor Anesthesia Care

## 2020-09-21 NOTE — Therapy (Signed)
[facility] Physical Therapy [address] [place], [place], [address] Phone: [phone]   Fax:  [phone]  Physical Therapy Evaluation  Patient Details  Name: Jared Griffin MRN: [id number] Date of Birth: 1961-07-13 Referring Provider (PT): Magnus Sinning, MD   Encounter Date: 09/20/2020   PT End of Session - 09/21/20 0735    Visit Number 1    Number of Visits 6    Date for PT Re-Evaluation 11/02/20    PT Start Time 0347    PT Stop Time 1055    PT Time Calculation (min) 40 min    Activity Tolerance Patient tolerated treatment well    Behavior During Therapy [facility] for tasks assessed/performed           Past Medical History:  Diagnosis Date  . Anxiety   . COPD (chronic obstructive pulmonary disease) ([place])   . Coronary atherosclerosis of native coronary artery    a. 12/09/2013: s/p PCI with 3.0 x 28 Promus DES to mLAD  . Depression   . Diabetic neuropathy ([place])   . Elbow injury 07/25/2017   Surgery for torn tendon  . Essential hypertension   . Fatty liver disease, nonalcoholic   . Lateral epicondylitis of right elbow   . Mixed hyperlipidemia   . Obesity   . Osgood-Schlatter's disease of right knee   . Skin cyst 10/03/2017   Removed from back  . Type 2 diabetes mellitus ([place])     Past Surgical History:  Procedure Laterality Date  . BIOPSY  01/21/2019   Procedure: BIOPSY;  Surgeon: [name], MD;  [facility];  Service: Endoscopy;;  gastric bx and gastric polyps  . CARDIAC CATHETERIZATION  2011  . CARDIAC CATHETERIZATION N/A 09/20/2015   Procedure: Left Heart Cath and Coronary Angiography;  Surgeon: [name], MD;  Location: [place] [facility];  Service: Cardiovascular;  Laterality: N/A;  . CHOLECYSTECTOMY  2003  . COLONOSCOPY  06/19/2012   Procedure: COLONOSCOPY;  Surgeon: [name], MD;  [facility];  Service: Endoscopy;  Laterality: N/A;  930  . COLONOSCOPY N/A 10/17/2017   Procedure:  COLONOSCOPY;  Surgeon: [name], MD;  [facility];  Service: Endoscopy;  Laterality: N/A;  1225  . CYST EXCISION  07/2019  . ESOPHAGOGASTRODUODENOSCOPY (EGD) WITH PROPOFOL N/A 01/21/2019   Procedure: ESOPHAGOGASTRODUODENOSCOPY (EGD) WITH PROPOFOL;  Surgeon: [name], MD;  [facility];  Service: Endoscopy;  Laterality: N/A;  10:15am  . KNEE ARTHROPLASTY Right 1999  . LEFT HEART CATH AND CORONARY ANGIOGRAPHY N/A 02/26/2018   Procedure: LEFT HEART CATH AND CORONARY ANGIOGRAPHY;  Surgeon: [name], MD;  Location: [place] [facility];  Service: Cardiovascular;  Laterality: N/A;  . LEFT HEART CATHETERIZATION WITH CORONARY ANGIOGRAM N/A 12/09/2013   Procedure: LEFT HEART CATHETERIZATION WITH CORONARY ANGIOGRAM;  Surgeon: [name], MD;  Location: [facility] CATH LAB;  Service: Cardiovascular;  Laterality: N/A;  . Liver biopsy    . POLYPECTOMY  10/17/2017   Procedure: POLYPECTOMY;  Surgeon: [name], MD;  [facility];  Service: Endoscopy;;  ascending colon, splenic flexure,sigmoid x2  . TENNIS ELBOW RELEASE/NIRSCHEL PROCEDURE Right 07/25/2017   Procedure: TENNIS ELBOW RELEASE and debriedment;  Surgeon: [name], MD;  Location: [facility];  Service: Orthopedics;  Laterality: Right;  . TONSILLECTOMY  1970's    There were no vitals filed for this visit.    Subjective Assessment - 09/20/20 1020  Subjective Pt is a 60 y/o male who presents to OPPT for chronic headaches and neck pain x 18 months.  Pt referred to neurologist (Dr. Carles Collet) and MRI showed decompressed nerve root, as well as dx of occipital neuralgia with ram horn headaches and ice pick headaches. He also has some shoulder pain, and is unable to sit in any chair that forces pt to sit with head forward, side bending and looking up.    Diagnostic tests MRI:  C6-7 HNP but no radic    Patient Stated Goals improve pain    Currently in Pain? Yes    Pain Score 2    up to 10/10;  at best 0/10   Pain Location Neck    Pain Orientation Right;Left    Pain Descriptors / Indicators Pressure    Pain Type Chronic pain    Pain Onset More than a month ago    Pain Frequency Intermittent    Aggravating Factors  head positions (forward, side bending and extension)    Pain Relieving Factors "nothing"    Effect of Pain on Daily Activities has to sleep on side, unable to sleep on back or stomach              [facility] PT Assessment - 09/20/20 1026      Assessment   Medical Diagnosis M79.18 (ICD-10-CM) - Myofascial pain syndrome; M54.2 (ICD-10-CM) - Cervicalgia; M54.81 (ICD-10-CM) - Bilateral occipital neuralgia    Referring Provider (PT) Magnus Sinning, MD    Onset Date/Surgical Date --   18 months ago   Hand Dominance Right    Next MD Visit 10/05/19    Prior Therapy n/a      Precautions   Precautions None      Restrictions   Weight Bearing Restrictions No      Balance Screen   Has the patient fallen in the past 6 months Yes    How many times? 3 - sounds like syncopal episode?/orthostatic hypotension?    Has the patient had a decrease in activity level because of a fear of falling?  Yes    Is the patient reluctant to leave their home because of a fear of falling?  No      Home Ecologist residence      Prior Function   Level of Independence Independent    Vocation Retired;On disability    Leisure hunting, fishing, mow grass; no regular exercise      Cognition   Overall Cognitive Status Within Functional Limits for tasks assessed      Observation/Other Assessments   Focus on Therapeutic Outcomes (FOTO)  pt did not complete      Posture/Postural Control   Posture/Postural Control Postural limitations    Postural Limitations Rounded Shoulders;Forward head;Increased thoracic kyphosis      ROM / Strength   AROM / PROM / Strength AROM;Strength      AROM   AROM Assessment Site Cervical    Cervical Flexion 50    Cervical  Extension 28   "pinching"   Cervical - Right Side Bend 27    Cervical - Left Side Bend 20      Strength   Overall Strength Comments Rt shoulder 4/5 flexion/abduction; Lt shoulder 5/5    Strength Assessment Site --      Palpation   Palpation comment trigger points noted in suboccipitals, cervical paraspinals, upper trap and levator scapula      Special Tests    Special Tests  Cervical    Cervical Tests Spurling's      Spurling's   Findings Negative                      Objective measurements completed on examination: See above findings.       [facility] Adult PT Treatment/Exercise - 09/20/20 1026      Exercises   Exercises Other Exercises    Other Exercises  see pt instructions - demonstrated stretches and suboccipital release      Manual Therapy   Manual therapy comments compression to bil suboccipitals            Trigger Point Dry Needling - 09/20/20 1053    Consent Given? Yes    Education Handout Provided Yes    Muscles Treated Head and Neck Suboccipitals    Suboccipitals Response Twitch response elicited                PT Education - 09/21/20 0735    Education Details HEP, DN    Person(s) Educated Patient    Methods Explanation;Demonstration;Handout    Comprehension Verbalized understanding;Returned demonstration;Need further instruction            PT Short Term Goals - 09/21/20 0738      PT SHORT TERM GOAL #1   Title independent with initial HEP    Status New    Target Date 10/05/20      PT SHORT TERM GOAL #2   Title n/a      PT SHORT TERM GOAL #3   Title n/a             PT Long Term Goals - 09/21/20 0739      PT LONG TERM GOAL #1   Title independent with advanced HEP    Status New    Target Date 11/02/20      PT LONG TERM GOAL #2   Title report pain < 3/10 with looking up for improved function    Status New    Target Date 11/02/20      PT LONG TERM GOAL #3   Title perform cervical ROM without increase in symptoms  for improved function    Status New    Target Date 11/02/20      PT LONG TERM GOAL #4   Title n/a                  Plan - 09/21/20 0736    Clinical Impression Statement Pt is a 60 y/o male who presents to OPPT for neck pain and headaches x 18 months.  Pt demonstrates decreased strength and ROM as well as active trigger points and postural abnormalities.  Pt will benefit from PT to address deficits listed.    Personal Factors and Comorbidities Comorbidity 3+;Time since onset of injury/illness/exacerbation;Fitness    Comorbidities anxiety, COPD, CAD, depression, DM with neuropathy, Rt TKA    Examination-Activity Limitations Sleep;Reach Overhead;Carry;Lift    Examination-Participation Restrictions Yard Work;Community Activity    Stability/Clinical Decision Making Evolving/Moderate complexity    Clinical Decision Making Moderate    Rehab Potential Good    PT Frequency 1x / week    PT Duration 6 weeks    PT Treatment/Interventions ADLs/Self Care Home Management;Cryotherapy;Electrical Stimulation;Canalith Repostioning;Ultrasound;Moist Heat;Traction;Functional mobility training;Therapeutic activities;Therapeutic exercise;Neuromuscular re-education;Patient/family education;Manual techniques;Passive range of motion;Dry needling;Taping;Vestibular    PT Next Visit Plan review stretches, assess response to DN and continue PRN, postural exercises, ?check orthostatics - reports hx of near syncopal episodes  PT Home Exercise Plan Access Code: [email or web address]    Consulted and Agree with Plan of Care Patient           Patient will benefit from skilled therapeutic intervention in order to improve the following deficits and impairments:  Pain,Postural dysfunction,Increased muscle spasms,Increased fascial restricitons,Decreased range of motion,Decreased strength  Visit Diagnosis: Cervicalgia - Plan: PT plan of care cert/re-cert  Muscle weakness (generalized) - Plan: PT plan of care  cert/re-cert  Other symptoms and signs involving the musculoskeletal system - Plan: PT plan of care cert/re-cert     Problem List Patient Active Problem List   Diagnosis Date Noted  . Chronic diarrhea 08/17/2020  . NASH (nonalcoholic steatohepatitis) 08/17/2020  . Elevated LFTs 08/17/2020  . Tremor of both hands 05/06/2020  . Bilateral occipital neuralgia 05/06/2020  . Abdominal pain, chronic, epigastric 01/13/2019  . Anxiety 01/04/2019  . Radicular pain of left lower extremity 12/22/2018  . Moderate persistent asthma 10/25/2018  . Dizziness 07/12/2018  . Nonintractable headache   . Abnormal nuclear stress test   . Aftercare following surgery 07/25/17 08/13/2017  . Hx of colonic polyps 08/07/2017  . Lateral epicondylitis of right elbow   . Cervical nerve root impingement 12/09/2015  . Generalized anxiety disorder 09/27/2015  . Chest pain 09/20/2015  . Pain in the chest   . Depression 01/24/2015  . Esophageal reflux 01/24/2015  . Preoperative cardiovascular examination 06/28/2014  . DM type 2 causing vascular disease ([place])   . Mixed hyperlipidemia   . Obesity   . Intermediate coronary syndrome ([place]) 12/09/2013  . Lumbago 05/21/2012  . Essential hypertension, benign 12/28/2009  . CAD S/P LAD DES March 2015 12/28/2009  . Hyperlipidemia LDL goal <70 11/25/2009  . Accelerating angina ([place]) 11/25/2009  . DM 11/24/2009  . ABDOMINAL PAIN, HX OF 11/24/2009     Laureen Abrahams, PT, DPT 09/21/20 7:42 AM    [facility] Physical Therapy [address] [place], [place], [address] Phone: [phone]   Fax:  [phone]  Name: Jared Griffin MRN: [id number] Date of Birth: Jan 30, 1961

## 2020-09-22 DIAGNOSIS — E1165 Type 2 diabetes mellitus with hyperglycemia: Secondary | ICD-10-CM | POA: Diagnosis not present

## 2020-09-28 ENCOUNTER — Ambulatory Visit (INDEPENDENT_AMBULATORY_CARE_PROVIDER_SITE_OTHER): Payer: Medicare Other | Admitting: Physical Therapy

## 2020-09-28 ENCOUNTER — Other Ambulatory Visit: Payer: Self-pay

## 2020-09-28 ENCOUNTER — Encounter: Payer: Self-pay | Admitting: Physical Therapy

## 2020-09-28 DIAGNOSIS — M5416 Radiculopathy, lumbar region: Secondary | ICD-10-CM

## 2020-09-28 DIAGNOSIS — M542 Cervicalgia: Secondary | ICD-10-CM

## 2020-09-28 DIAGNOSIS — M6281 Muscle weakness (generalized): Secondary | ICD-10-CM

## 2020-09-28 DIAGNOSIS — R29898 Other symptoms and signs involving the musculoskeletal system: Secondary | ICD-10-CM | POA: Diagnosis not present

## 2020-09-28 NOTE — Patient Instructions (Signed)
Access Code: [email or web address] URL: [email or web address] Date: 09/28/2020 Prepared by: Faustino Congress  Exercises Seated Upper Trapezius Stretch - 2 x daily - 7 x weekly - 3 reps - 1 sets - 30 sec hold Seated Levator Scapulae Stretch - 2 x daily - 7 x weekly - 1 reps - 1 sets - 30 sec hold Seated Scapular Retraction - 2 x daily - 7 x weekly - 10 reps - 1 sets - 5 sec hold Standing Backward Shoulder Rolls - 2 x daily - 7 x weekly - 10 reps - 1 sets Supine Suboccipital Release with Tennis Balls - 1 x daily - 7 x weekly - 3-5 min hold  Patient Education Trigger Point Dry Needling

## 2020-09-28 NOTE — Therapy (Signed)
[facility] [address] [place], [place], [address] Phone: [phone]   Fax:  [phone]  Physical Therapy Treatment  Patient Details  Name: Jared Griffin MRN: [id number] Date of Birth: 14-Jan-1961 Referring Provider (PT): Magnus Sinning, MD   Encounter Date: 09/28/2020   PT End of Session - 09/28/20 0841    Visit Number 2    Number of Visits 6    Date for PT Re-Evaluation 11/02/20    PT Start Time 0800    PT Stop Time 0832    PT Time Calculation (min) 32 min    Activity Tolerance Patient tolerated treatment well    Behavior During Therapy [facility] for tasks assessed/performed           Past Medical History:  Diagnosis Date  . Anxiety   . COPD (chronic obstructive pulmonary disease) ([place])   . Coronary atherosclerosis of native coronary artery    a. 12/09/2013: s/p PCI with 3.0 x 28 Promus DES to mLAD  . Depression   . Diabetic neuropathy ([place])   . Elbow injury 07/25/2017   Surgery for torn tendon  . Essential hypertension   . Fatty liver disease, nonalcoholic   . Lateral epicondylitis of right elbow   . Mixed hyperlipidemia   . Obesity   . Osgood-Schlatter's disease of right knee   . Skin cyst 10/03/2017   Removed from back  . Type 2 diabetes mellitus ([place])     Past Surgical History:  Procedure Laterality Date  . BIOPSY  01/21/2019   Procedure: BIOPSY;  Surgeon: [name], MD;  [facility];  Service: Endoscopy;;  gastric bx and gastric polyps  . CARDIAC CATHETERIZATION  2011  . CARDIAC CATHETERIZATION N/A 09/20/2015   Procedure: Left Heart Cath and Coronary Angiography;  Surgeon: [name], MD;  Location: [place] [facility];  Service: Cardiovascular;  Laterality: N/A;  . CHOLECYSTECTOMY  2003  . COLONOSCOPY  06/19/2012   Procedure: COLONOSCOPY;  Surgeon: [name], MD;  [facility];  Service: Endoscopy;  Laterality: N/A;  930  . COLONOSCOPY N/A 10/17/2017   Procedure:  COLONOSCOPY;  Surgeon: [name], MD;  [facility];  Service: Endoscopy;  Laterality: N/A;  1225  . CYST EXCISION  07/2019  . ESOPHAGOGASTRODUODENOSCOPY (EGD) WITH PROPOFOL N/A 01/21/2019   Procedure: ESOPHAGOGASTRODUODENOSCOPY (EGD) WITH PROPOFOL;  Surgeon: [name], MD;  [facility];  Service: Endoscopy;  Laterality: N/A;  10:15am  . KNEE ARTHROPLASTY Right 1999  . LEFT HEART CATH AND CORONARY ANGIOGRAPHY N/A 02/26/2018   Procedure: LEFT HEART CATH AND CORONARY ANGIOGRAPHY;  Surgeon: [name], MD;  Location: [place] [facility];  Service: Cardiovascular;  Laterality: N/A;  . LEFT HEART CATHETERIZATION WITH CORONARY ANGIOGRAM N/A 12/09/2013   Procedure: LEFT HEART CATHETERIZATION WITH CORONARY ANGIOGRAM;  Surgeon: [name], MD;  Location: [facility] CATH LAB;  Service: Cardiovascular;  Laterality: N/A;  . Liver biopsy    . POLYPECTOMY  10/17/2017   Procedure: POLYPECTOMY;  Surgeon: [name], MD;  [facility];  Service: Endoscopy;;  ascending colon, splenic flexure,sigmoid x2  . TENNIS ELBOW RELEASE/NIRSCHEL PROCEDURE Right 07/25/2017   Procedure: TENNIS ELBOW RELEASE and debriedment;  Surgeon: [name], MD;  Location: [facility];  Service: Orthopedics;  Laterality: Right;  . TONSILLECTOMY  1970's    There were no vitals filed for this visit.   Subjective Assessment - 09/28/20 0803    Subjective  has had 2 headaches since last PT session, hasn't noticed a significant difference.  had one last night (rated 8/10) but still lingering this morning    Diagnostic tests MRI:  C6-7 HNP but no radic    Patient Stated Goals improve pain    Currently in Pain? Yes    Pain Score 2     Pain Location Head    Pain Orientation Left    Pain Descriptors / Indicators Headache    Pain Type Chronic pain    Pain Onset More than a month ago    Pain Frequency Intermittent    Aggravating Factors  head position (forward, side bending and  extension)    Pain Relieving Factors tylenol                             [facility] Adult PT Treatment/Exercise - 09/28/20 0824      Exercises   Exercises Neck      Neck Exercises: Seated   Shoulder Rolls Backwards;10 reps    Other Seated Exercise scap retraction 10 x5 sec hold      Manual Therapy   Manual therapy comments compression to bil suboccipitals, upper traps      Neck Exercises: Stretches   Upper Trapezius Stretch Right;Left;2 reps;30 seconds    Levator Stretch Right;Left;2 reps;30 seconds            Trigger Point Dry Needling - 09/28/20 0829    Consent Given? Yes    Education Handout Provided Yes    Muscles Treated Head and Neck Suboccipitals;Upper trapezius    Upper Trapezius Response Twitch reponse elicited    Suboccipitals Response Twitch response elicited                  PT Short Term Goals - 09/21/20 0738      PT SHORT TERM GOAL #1   Title independent with initial HEP    Status New    Target Date 10/05/20      PT SHORT TERM GOAL #2   Title n/a      PT SHORT TERM GOAL #3   Title n/a             PT Long Term Goals - 09/21/20 0739      PT LONG TERM GOAL #1   Title independent with advanced HEP    Status New    Target Date 11/02/20      PT LONG TERM GOAL #2   Title report pain < 3/10 with looking up for improved function    Status New    Target Date 11/02/20      PT LONG TERM GOAL #3   Title perform cervical ROM without increase in symptoms for improved function    Status New    Target Date 11/02/20      PT LONG TERM GOAL #4   Title n/a                 Plan - 09/28/20 0843    Clinical Impression Statement Pt tolerated session well today, noting minimal change in symptoms today.  He also endorses increase stress with wife's health at home as well as limited ability to perform HEP, so feel these also contributing to limited change in symptoms.  Will continue to benefit from PT to maximize function.     Personal Factors and Comorbidities Comorbidity 3+;Time since onset of injury/illness/exacerbation;Fitness    Comorbidities anxiety, COPD, CAD,  depression, DM with neuropathy, Rt TKA    Examination-Activity Limitations Sleep;Reach Overhead;Carry;Lift    Examination-Participation Restrictions Yard Work;Community Activity    Stability/Clinical Decision Making Evolving/Moderate complexity    Rehab Potential Good    PT Frequency 1x / week    PT Duration 6 weeks    PT Treatment/Interventions ADLs/Self Care Home Management;Cryotherapy;Electrical Stimulation;Canalith Repostioning;Ultrasound;Moist Heat;Traction;Functional mobility training;Therapeutic activities;Therapeutic exercise;Neuromuscular re-education;Patient/family education;Manual techniques;Passive range of motion;Dry needling;Taping;Vestibular    PT Next Visit Plan review stretches, assess response to DN and continue PRN, postural exercises, ?check orthostatics - reports hx of near syncopal episodes; see if he's been more compliant with HEP    PT Home Exercise Plan Access Code: [email or web address]    Consulted and Agree with Plan of Care Patient           Patient will benefit from skilled therapeutic intervention in order to improve the following deficits and impairments:  Pain,Postural dysfunction,Increased muscle spasms,Increased fascial restricitons,Decreased range of motion,Decreased strength  Visit Diagnosis: Cervicalgia  Muscle weakness (generalized)  Other symptoms and signs involving the musculoskeletal system  Radiculopathy, lumbar region     Problem List Patient Active Problem List   Diagnosis Date Noted  . Chronic diarrhea 08/17/2020  . NASH (nonalcoholic steatohepatitis) 08/17/2020  . Elevated LFTs 08/17/2020  . Tremor of both hands 05/06/2020  . Bilateral occipital neuralgia 05/06/2020  . Abdominal pain, chronic, epigastric 01/13/2019  . Anxiety 01/04/2019  . Radicular pain of left lower extremity 12/22/2018  . Moderate  persistent asthma 10/25/2018  . Dizziness 07/12/2018  . Nonintractable headache   . Abnormal nuclear stress test   . Aftercare following surgery 07/25/17 08/13/2017  . Hx of colonic polyps 08/07/2017  . Lateral epicondylitis of right elbow   . Cervical nerve root impingement 12/09/2015  . Generalized anxiety disorder 09/27/2015  . Chest pain 09/20/2015  . Pain in the chest   . Depression 01/24/2015  . Esophageal reflux 01/24/2015  . Preoperative cardiovascular examination 06/28/2014  . DM type 2 causing vascular disease ([place])   . Mixed hyperlipidemia   . Obesity   . Intermediate coronary syndrome ([place]) 12/09/2013  . Lumbago 05/21/2012  . Essential hypertension, benign 12/28/2009  . CAD S/P LAD DES March 2015 12/28/2009  . Hyperlipidemia LDL goal <70 11/25/2009  . Accelerating angina ([place]) 11/25/2009  . DM 11/24/2009  . ABDOMINAL PAIN, HX OF 11/24/2009      Laureen Abrahams, PT, DPT 09/28/20 8:46 AM    [facility] Physical Therapy [address] [place], [place], [address] Phone: [phone]   Fax:  [phone]  Name: Jeancarlo Leffler MRN: [id number] Date of Birth: [birth year]

## 2020-09-30 ENCOUNTER — Telehealth: Payer: Self-pay | Admitting: Cardiology

## 2020-09-30 MED ORDER — ATORVASTATIN CALCIUM 40 MG PO TABS: 40.0000 mg | ORAL_TABLET | Freq: Every day | ORAL | 1 refills | Status: DC

## 2020-09-30 NOTE — Telephone Encounter (Signed)
Refilled per request.

## 2020-09-30 NOTE — Telephone Encounter (Signed)
New message     *STAT* If patient is at the pharmacy, call can be transferred to refill team.   1. Which medications need to be refilled? (please list name of each medication and dose if known)  atorvastatin (LIPITOR) 40 MG tablet  2. Which pharmacy/location (including street and city if local pharmacy) is medication to be sent to? [facility]  3. Do they need a 30 day or 90 day supply?  [place]

## 2020-10-03 ENCOUNTER — Encounter: Payer: Medicare Other | Admitting: Physical Therapy

## 2020-10-03 ENCOUNTER — Ambulatory Visit: Payer: Medicare Other | Admitting: Physical Medicine and Rehabilitation

## 2020-10-04 ENCOUNTER — Ambulatory Visit: Payer: Medicare Other | Admitting: Physical Medicine and Rehabilitation

## 2020-10-06 ENCOUNTER — Other Ambulatory Visit: Payer: Self-pay

## 2020-10-06 ENCOUNTER — Ambulatory Visit (INDEPENDENT_AMBULATORY_CARE_PROVIDER_SITE_OTHER): Payer: Worker's Compensation | Admitting: Orthopedic Surgery

## 2020-10-06 ENCOUNTER — Encounter: Payer: Self-pay | Admitting: Orthopedic Surgery

## 2020-10-06 VITALS — BP 118/84 | HR 88 | Ht 74.0 in | Wt 264.0 lb

## 2020-10-06 DIAGNOSIS — G8929 Other chronic pain: Secondary | ICD-10-CM

## 2020-10-06 DIAGNOSIS — M25521 Pain in right elbow: Secondary | ICD-10-CM

## 2020-10-06 NOTE — Patient Instructions (Addendum)
Injection was given in the elbow for lateral epicondylitis  Our opinion is that he should get another opinion regarding radial nerve exploration and this should be done by an upper extremity specialist  Continue to work on your lateral elbow exercises  We will update you on the insurance companies opinion on the second opinion regarding the nerve exploration

## 2020-10-06 NOTE — Progress Notes (Signed)
MRI RESULTS FOLLOW UP   Encounter Diagnosis  Name Primary?  . Chronic elbow pain, right Yes    Chief Complaint  Patient presents with  . Elbow Pain    MRI results    Jared Griffin is 60 years old he had a lateral epicondylar release and failed to get good pain relief and now has pain radiating into his forearm.  He has had a consult with a hand specialist who suggested a radial nerve exploration which I do not do  MRI does not show any tear he has some tendinitis and scarring consistent with his surgery     + EXAM FINDINGS: Tremor right and left upper extremity, tenderness over the lateral epicondyle.  No evidence of Tinel's at the surgical scar site.  Pain in the forearm  Weakness in extension grade 4 in terms of wrist extension   MY READING OF THE MRI chronic scarring consistent with surgical procedure  MRI REPORT:   We see scanned documents  ASSESSMENT AND PLAN :   60 year old male failed lateral epicondyle release question of radial nerve inflammation question of need for radial nerve exploration  I do not do that kind of surgery and would recommend another opinion regarding the need for radial nerve exploration  Patient also has a tremor which is being worked up by a neurologist in the near future and I would not recommend any surgery until the cause of the tremor is sorted out  Procedure note injection for right tennis elbow   Diagnosis right tennis elbow  Anesthesia ethyl chloride was used Alcohol use is clean the skin  After we obtained verbal consent and timeout a 25-gauge needle was used to inject 40 mg of Depo-Medrol and 3 cc of 1% lidocaine just distal to the insertion of the ECRB  There were no complications and a sterile bandage was applied.

## 2020-10-07 ENCOUNTER — Telehealth: Payer: Self-pay | Admitting: Cardiology

## 2020-10-07 ENCOUNTER — Other Ambulatory Visit: Payer: Self-pay

## 2020-10-07 MED ORDER — LOSARTAN POTASSIUM 25 MG PO TABS: ORAL_TABLET | ORAL | 3 refills | Status: DC

## 2020-10-07 NOTE — Telephone Encounter (Deleted)
Requested Prescriptions      No prescriptions requested or ordered in this encounter

## 2020-10-07 NOTE — Telephone Encounter (Signed)
Medication refill for Cozaar (Losartan) 25 mg tablets approved and sent to [facility]. -AB, CMA

## 2020-10-07 NOTE — Telephone Encounter (Signed)
*  STAT* If patient is at the pharmacy, call can be transferred to refill team.   1. Which medications need to be refilled? (please list name of each medication and dose if known) Cozaar/Losartin  2. Which pharmacy/location (including street and city if local pharmacy) is medication to be sent to? [place]  3. Do they need a 30 day or 90 day supply? [place]

## 2020-10-10 ENCOUNTER — Encounter: Payer: Medicare Other | Admitting: Physical Therapy

## 2020-10-12 ENCOUNTER — Encounter (INDEPENDENT_AMBULATORY_CARE_PROVIDER_SITE_OTHER): Payer: Self-pay | Admitting: *Deleted

## 2020-10-13 ENCOUNTER — Ambulatory Visit: Payer: Medicare Other | Admitting: Physical Medicine and Rehabilitation

## 2020-10-18 ENCOUNTER — Encounter: Payer: Self-pay | Admitting: Physical Therapy

## 2020-10-18 ENCOUNTER — Ambulatory Visit: Payer: Medicare Other | Admitting: Physical Therapy

## 2020-10-18 ENCOUNTER — Other Ambulatory Visit: Payer: Self-pay

## 2020-10-18 DIAGNOSIS — R809 Proteinuria, unspecified: Secondary | ICD-10-CM | POA: Diagnosis not present

## 2020-10-18 DIAGNOSIS — M542 Cervicalgia: Secondary | ICD-10-CM

## 2020-10-18 DIAGNOSIS — E1140 Type 2 diabetes mellitus with diabetic neuropathy, unspecified: Secondary | ICD-10-CM | POA: Diagnosis not present

## 2020-10-18 DIAGNOSIS — E1165 Type 2 diabetes mellitus with hyperglycemia: Secondary | ICD-10-CM | POA: Diagnosis not present

## 2020-10-18 DIAGNOSIS — E119 Type 2 diabetes mellitus without complications: Secondary | ICD-10-CM | POA: Diagnosis not present

## 2020-10-18 DIAGNOSIS — E781 Pure hyperglyceridemia: Secondary | ICD-10-CM | POA: Diagnosis not present

## 2020-10-18 DIAGNOSIS — R29898 Other symptoms and signs involving the musculoskeletal system: Secondary | ICD-10-CM | POA: Diagnosis not present

## 2020-10-18 DIAGNOSIS — Z794 Long term (current) use of insulin: Secondary | ICD-10-CM | POA: Diagnosis not present

## 2020-10-18 DIAGNOSIS — M6281 Muscle weakness (generalized): Secondary | ICD-10-CM

## 2020-10-18 DIAGNOSIS — I2510 Atherosclerotic heart disease of native coronary artery without angina pectoris: Secondary | ICD-10-CM | POA: Diagnosis not present

## 2020-10-18 LAB — HEMOGLOBIN A1C: Hemoglobin A1C: 6.6

## 2020-10-18 NOTE — Therapy (Unsigned)
[facility] Physical Therapy [address] [place], [place], [address] Phone: [phone]   Fax:  [phone]  Physical Therapy Treatment/Discharge Summary  Patient Details  Name: Jared Griffin MRN: [id number] Date of Birth: 05-Apr-1961 Referring Provider (PT): Magnus Sinning, MD   Encounter Date: 10/18/2020   PT End of Session - 10/18/20 1331    Visit Number 3    Number of Visits 6    Date for PT Re-Evaluation 11/02/20    PT Start Time 1257    PT Stop Time 1331    PT Time Calculation (min) 34 min    Activity Tolerance Patient tolerated treatment well    Behavior During Therapy [facility] for tasks assessed/performed           Past Medical History:  Diagnosis Date  . Anxiety   . COPD (chronic obstructive pulmonary disease) ([place])   . Coronary atherosclerosis of native coronary artery    a. 12/09/2013: s/p PCI with 3.0 x 28 Promus DES to mLAD  . Depression   . Diabetic neuropathy ([place])   . Elbow injury 07/25/2017   Surgery for torn tendon  . Essential hypertension   . Fatty liver disease, nonalcoholic   . Lateral epicondylitis of right elbow   . Mixed hyperlipidemia   . Obesity   . Osgood-Schlatter's disease of right knee   . Skin cyst 10/03/2017   Removed from back  . Type 2 diabetes mellitus ([place])     Past Surgical History:  Procedure Laterality Date  . BIOPSY  01/21/2019   Procedure: BIOPSY;  Surgeon: [name], MD;  [facility];  Service: Endoscopy;;  gastric bx and gastric polyps  . CARDIAC CATHETERIZATION  2011  . CARDIAC CATHETERIZATION N/A 09/20/2015   Procedure: Left Heart Cath and Coronary Angiography;  Surgeon: [name], MD;  Location: [place] [facility];  Service: Cardiovascular;  Laterality: N/A;  . CHOLECYSTECTOMY  2003  . COLONOSCOPY  06/19/2012   Procedure: COLONOSCOPY;  Surgeon: [name], MD;  [facility];  Service: Endoscopy;  Laterality: N/A;  930  . COLONOSCOPY N/A 10/17/2017    Procedure: COLONOSCOPY;  Surgeon: [name], MD;  [facility];  Service: Endoscopy;  Laterality: N/A;  1225  . CYST EXCISION  07/2019  . ESOPHAGOGASTRODUODENOSCOPY (EGD) WITH PROPOFOL N/A 01/21/2019   Procedure: ESOPHAGOGASTRODUODENOSCOPY (EGD) WITH PROPOFOL;  Surgeon: [name], MD;  [facility];  Service: Endoscopy;  Laterality: N/A;  10:15am  . KNEE ARTHROPLASTY Right 1999  . LEFT HEART CATH AND CORONARY ANGIOGRAPHY N/A 02/26/2018   Procedure: LEFT HEART CATH AND CORONARY ANGIOGRAPHY;  Surgeon: [name], MD;  Location: [place] [facility];  Service: Cardiovascular;  Laterality: N/A;  . LEFT HEART CATHETERIZATION WITH CORONARY ANGIOGRAM N/A 12/09/2013   Procedure: LEFT HEART CATHETERIZATION WITH CORONARY ANGIOGRAM;  Surgeon: [name], MD;  Location: [facility] CATH LAB;  Service: Cardiovascular;  Laterality: N/A;  . Liver biopsy    . POLYPECTOMY  10/17/2017   Procedure: POLYPECTOMY;  Surgeon: [name], MD;  [facility];  Service: Endoscopy;;  ascending colon, splenic flexure,sigmoid x2  . TENNIS ELBOW RELEASE/NIRSCHEL PROCEDURE Right 07/25/2017   Procedure: TENNIS ELBOW RELEASE and debriedment;  Surgeon: [name], MD;  Location: [facility];  Service: Orthopedics;  Laterality: Right;  . TONSILLECTOMY  1970's    There were no vitals filed for this visit.   Subjective Assessment - 10/18/20 1251  Subjective feels like he's had no change in symptoms; pain looking up 0/10; looking down 2-3/10    Diagnostic tests MRI:  C6-7 HNP but no radic    Patient Stated Goals improve pain    Currently in Pain? Yes    Pain Score 3     Pain Location Head    Pain Orientation Left;Posterior;Medial    Pain Descriptors / Indicators Headache    Pain Type Chronic pain    Pain Onset More than a month ago    Pain Frequency Intermittent    Aggravating Factors  head positioning    Pain Relieving Factors tylenol              [facility] PT  Assessment - 10/18/20 1323      Assessment   Medical Diagnosis M79.18 (ICD-10-CM) - Myofascial pain syndrome; M54.2 (ICD-10-CM) - Cervicalgia; M54.81 (ICD-10-CM) - Bilateral occipital neuralgia    Referring Provider (PT) Magnus Sinning, MD      AROM   Overall AROM Comments pain with cervical flexion, Rt sidebending and Rt rotation -  all others without increase in symptoms or pain                         [facility] Adult PT Treatment/Exercise - 10/18/20 1301      Self-Care   Self-Care Other Self-Care Comments    Other Self-Care Comments  discussed current progress and symptoms and neurologist recommendation for referral to HA specialist.  At this time given limited improvement in symptoms recommed he follow up with this.  Discussed continue HEP and daily exercise to help with stress and tightness in neck as pain in certain motions has improved.  He is agreeable and will follow up with other MDs.      Neck Exercises: Machines for Strengthening   UBE (Upper Arm Bike) L4 x 6 min (3' each direction)                    PT Short Term Goals - 10/18/20 1335      PT SHORT TERM GOAL #1   Title independent with initial HEP    Status Achieved    Target Date 10/05/20      PT SHORT TERM GOAL #2   Title n/a      PT SHORT TERM GOAL #3   Title n/a             PT Long Term Goals - 10/18/20 1335      PT LONG TERM GOAL #1   Title independent with advanced HEP    Status Achieved      PT LONG TERM GOAL #2   Title report pain < 3/10 with looking up for improved function    Status Achieved      PT LONG TERM GOAL #3   Title perform cervical ROM without increase in symptoms for improved function    Status Partially Met      PT LONG TERM GOAL #4   Title n/a                 Plan - 10/18/20 1335    Clinical Impression Statement Pt partially met LTGs, and reports no change in headaches at this time.  He has seen a movement disorders specialist who recommended  headache specialist.  Discussed reaching out to follow up on this recommendation and continue from there.  He has had some improvement in cervical ROM and pain so encouraged  he continue his HEP and daily exercise.  Wife's health is significantly impacting his stress which is likely large componenet to his symptoms.  Will d/c PT today to focus on other areas.    Personal Factors and Comorbidities Comorbidity 3+;Time since onset of injury/illness/exacerbation;Fitness    Comorbidities anxiety, COPD, CAD, depression, DM with neuropathy, Rt TKA    Examination-Activity Limitations Sleep;Reach Overhead;Carry;Lift    Examination-Participation Restrictions Yard Work;Community Activity    Stability/Clinical Decision Making Evolving/Moderate complexity    Rehab Potential Good    PT Frequency 1x / week    PT Duration 6 weeks    PT Treatment/Interventions ADLs/Self Care Home Management;Cryotherapy;Electrical Stimulation;Canalith Repostioning;Ultrasound;Moist Heat;Traction;Functional mobility training;Therapeutic activities;Therapeutic exercise;Neuromuscular re-education;Patient/family education;Manual techniques;Passive range of motion;Dry needling;Taping;Vestibular    PT Next Visit Plan d/c PT    PT Home Exercise Plan Access Code: [email or web address]    Consulted and Agree with Plan of Care Patient           Patient will benefit from skilled therapeutic intervention in order to improve the following deficits and impairments:  Pain,Postural dysfunction,Increased muscle spasms,Increased fascial restricitons,Decreased range of motion,Decreased strength  Visit Diagnosis: Cervicalgia  Muscle weakness (generalized)  Other symptoms and signs involving the musculoskeletal system     Problem List Patient Active Problem List   Diagnosis Date Noted  . Chronic diarrhea 08/17/2020  . NASH (nonalcoholic steatohepatitis) 08/17/2020  . Elevated LFTs 08/17/2020  . Tremor of both hands 05/06/2020  . Bilateral occipital  neuralgia 05/06/2020  . Abdominal pain, chronic, epigastric 01/13/2019  . Anxiety 01/04/2019  . Radicular pain of left lower extremity 12/22/2018  . Moderate persistent asthma 10/25/2018  . Dizziness 07/12/2018  . Nonintractable headache   . Abnormal nuclear stress test   . Aftercare following surgery 07/25/17 08/13/2017  . Hx of colonic polyps 08/07/2017  . Lateral epicondylitis of right elbow   . Cervical nerve root impingement 12/09/2015  . Generalized anxiety disorder 09/27/2015  . Chest pain 09/20/2015  . Pain in the chest   . Depression 01/24/2015  . Esophageal reflux 01/24/2015  . Preoperative cardiovascular examination 06/28/2014  . DM type 2 causing vascular disease ([place])   . Mixed hyperlipidemia   . Obesity   . Intermediate coronary syndrome ([place]) 12/09/2013  . Lumbago 05/21/2012  . Essential hypertension, benign 12/28/2009  . CAD S/P LAD DES March 2015 12/28/2009  . Hyperlipidemia LDL goal <70 11/25/2009  . Accelerating angina ([place]) 11/25/2009  . DM 11/24/2009  . ABDOMINAL PAIN, HX OF 11/24/2009      Laureen Abrahams, PT, DPT 10/18/20 1:40 PM    [facility] Physical Therapy [address] [place], [place], [address] Phone: [phone]   Fax:  [phone]  Name: Jared Griffin MRN: [id number] Date of Birth: 12/25/1960      PHYSICAL THERAPY DISCHARGE SUMMARY  Visits from Start of Care: 3  Current functional level related to goals / functional outcomes: See above   Remaining deficits: See above   Education / Equipment: HEP, neurology info   Plan: Patient agrees to discharge.  Patient goals were partially met. Patient is being discharged due to lack of progress.  ?????     Laureen Abrahams, PT, DPT 10/18/20 1:40 PM  [facility] Physical Therapy [address] [place], [place], [address] Phone: [phone]   Fax:  [phone]

## 2020-10-19 ENCOUNTER — Ambulatory Visit (INDEPENDENT_AMBULATORY_CARE_PROVIDER_SITE_OTHER): Payer: Worker's Compensation | Admitting: Orthopedic Surgery

## 2020-10-19 ENCOUNTER — Encounter: Payer: Self-pay | Admitting: Orthopedic Surgery

## 2020-10-19 VITALS — BP 139/97 | HR 95 | Ht 74.0 in | Wt 264.0 lb

## 2020-10-19 DIAGNOSIS — Z8619 Personal history of other infectious and parasitic diseases: Secondary | ICD-10-CM | POA: Diagnosis not present

## 2020-10-19 DIAGNOSIS — E559 Vitamin D deficiency, unspecified: Secondary | ICD-10-CM | POA: Diagnosis not present

## 2020-10-19 DIAGNOSIS — M25521 Pain in right elbow: Secondary | ICD-10-CM

## 2020-10-19 DIAGNOSIS — E1165 Type 2 diabetes mellitus with hyperglycemia: Secondary | ICD-10-CM | POA: Diagnosis not present

## 2020-10-19 DIAGNOSIS — G8929 Other chronic pain: Secondary | ICD-10-CM | POA: Diagnosis not present

## 2020-10-19 NOTE — Progress Notes (Signed)
Chief Complaint  Patient presents with  . Elbow Pain    Right/ discuss MRI again per workers comp they asked him to come back again to discuss the MRI again   According to my staff and the patient the patient was told to come back for the MRI to be evaluated again.  My evaluation has not changed.  See the note as indicated from the last visit.  I have included below  Especially pay attention to the area which reviews the plan   MRI RESULTS FOLLOW UP    Encounter Diagnosis  Name Primary?  . Chronic elbow pain, right Yes          Chief Complaint  Patient presents with  . Elbow Pain      MRI results      Kearney is 60 years old he had a lateral epicondylar release and failed to get good pain relief and now has pain radiating into his forearm.  He has had a consult with a hand specialist who suggested a radial nerve exploration which I do not do   MRI does not show any tear he has some tendinitis and scarring consistent with his surgery         + EXAM FINDINGS: Tremor right and left upper extremity, tenderness over the lateral epicondyle.  No evidence of Tinel's at the surgical scar site.  Pain in the forearm   Weakness in extension grade 4 in terms of wrist extension     MY READING OF THE MRI chronic scarring consistent with surgical procedure   MRI REPORT:     see scanned documents   ASSESSMENT AND PLAN :    60 year old male failed lateral epicondyle release question of radial nerve inflammation question of need for radial nerve exploration   I do not do that kind of surgery and would recommend another opinion regarding the need for radial nerve exploration   Patient also has a tremor which is being worked up by a neurologist in the near future and I would not recommend any surgery until the cause of the tremor is sorted out   Procedure note injection for right tennis elbow    Diagnosis right tennis elbow   Anesthesia ethyl chloride was used Alcohol use is clean the  skin   After we obtained verbal consent and timeout a 25-gauge needle was used to inject 40 mg of Depo-Medrol and 3 cc of 1% lidocaine just distal to the insertion of the ECRB   There were no complications and a sterile bandage was applied.

## 2020-10-19 NOTE — Patient Instructions (Signed)
Wait to hear from [place]

## 2020-10-20 ENCOUNTER — Encounter: Payer: Self-pay | Admitting: Family Medicine

## 2020-10-20 ENCOUNTER — Encounter (INDEPENDENT_AMBULATORY_CARE_PROVIDER_SITE_OTHER): Payer: Self-pay

## 2020-10-23 DIAGNOSIS — E1165 Type 2 diabetes mellitus with hyperglycemia: Secondary | ICD-10-CM | POA: Diagnosis not present

## 2020-11-02 ENCOUNTER — Telehealth (INDEPENDENT_AMBULATORY_CARE_PROVIDER_SITE_OTHER): Payer: Medicare Other | Admitting: Psychiatry

## 2020-11-02 ENCOUNTER — Other Ambulatory Visit: Payer: Self-pay

## 2020-11-02 ENCOUNTER — Encounter (HOSPITAL_COMMUNITY): Payer: Self-pay | Admitting: Psychiatry

## 2020-11-02 DIAGNOSIS — F321 Major depressive disorder, single episode, moderate: Secondary | ICD-10-CM

## 2020-11-02 MED ORDER — ALPRAZOLAM 1 MG PO TABS: 1.0000 mg | ORAL_TABLET | Freq: Three times a day (TID) | ORAL | 2 refills | Status: DC | PRN

## 2020-11-02 MED ORDER — TRAZODONE HCL 50 MG PO TABS: 50.0000 mg | ORAL_TABLET | Freq: Every evening | ORAL | 2 refills | Status: DC | PRN

## 2020-11-02 MED ORDER — SERTRALINE HCL 100 MG PO TABS: 150.0000 mg | ORAL_TABLET | Freq: Every day | ORAL | 2 refills | Status: DC

## 2020-11-02 MED ORDER — RANOLAZINE ER 1000 MG PO TB12: 1000.0000 mg | ORAL_TABLET | Freq: Two times a day (BID) | ORAL | 1 refills | Status: DC

## 2020-11-02 NOTE — Progress Notes (Signed)
Virtual Visit via Telephone Note  I connected with Jared Griffin on 11/02/20 at  9:40 AM EST by telephone and verified that I am speaking with the correct person using two identifiers.  Location: Patient: home Provider: home   I discussed the limitations, risks, security and privacy concerns of performing an evaluation and management service by telephone and the availability of in person appointments. I also discussed with the patient that there may be a patient responsible charge related to this service. The patient expressed understanding and agreed to proceed.    I discussed the assessment and treatment plan with the patient. The patient was provided an opportunity to ask questions and all were answered. The patient agreed with the plan and demonstrated an understanding of the instructions.   The patient was advised to call back or seek an in-person evaluation if the symptoms worsen or if the condition fails to improve as anticipated.  I provided 15 minutes of non-face-to-face time during this encounter.   [name], MD  [facility] MD/PA/NP OP Progress Note  11/02/2020 10:03 AM Jared Griffin  MRN:  [id number]  Chief Complaint:  Chief Complaint    Depression; Anxiety; Follow-up     HPI: This patient is a 60 year old married white male lives with his wife and son in [place]. 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 zonebut had an arm injury at work and had to quit there as well.  The patient was referred by his primary physician, Dr. [name], for further assessment and treatment of anxiety and depression.  The patient states that he is become increasingly depressed and anxious over the last couple of years due to work stress. He stated that he was always under pressure to get more things done than he had time to do. 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 He got to the point of hating going to work. Several years ago he had cardiac problems and had a stent placed. In January of this year he began developing dizzy spells chest pain headaches stomach aches. It got so bad that he was readmitted for cardiac catheterization but nothing new was found.  The patient was having difficulty sleeping constant worry and anxiety about his job sadness and anger. He was getting irritable with people at home. After talking to his primary care physician at length and decided to take him out on medical retirement. The patient has been on Wellbutrin SR 156mtwice a day for several weeks now. He had also been out in the past when his wife was sick. He has not seen any benefit from this but does feel tremendously better since he stopped working. He is also on Xanax 0.5 mg 3 times a day which he thinks has helped to some degree.  Since stopping working he's had a big turnaround. His sleep and energy have improved. 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 He is much less anxious and is no longer having the chest pain dizzy spells or stomachaches. He denies suicidal ideation or psychotic symptoms. He does not use drugs or alcohol  The patient returns for follow-up after 3 months.  He states he has been stressed because of his wife's condition.  She keeps having falling out spells.  These  are happening several times a day.  She has been evaluated by numerous specialists that [place].  No one seems to really know what is going on here.  He cannot leave her side because she might fall out at any moment.  This is been stressful for him.  He asked if we can go back to Xanax perhaps at a higher dose.  I explained that we could do this temporarily given the circumstances.  Overall he is sleeping well with the trazodone and his mood has been stable.  His primary doctor  noted that he had tremor in his upper extremities.  He states he has seen a neurologist who told him it was due to "stress." Visit Diagnosis:    ICD-10-CM   1. Moderate single current episode of major depressive disorder ([place])  F32.1     Past Psychiatric History: none  Past Medical History:  Past Medical History:  Diagnosis Date  . Anxiety   . COPD (chronic obstructive pulmonary disease) ([place])   . Coronary atherosclerosis of native coronary artery    a. 12/09/2013: s/p PCI with 3.0 x 28 Promus DES to mLAD  . Depression   . Diabetic neuropathy ([place])   . Elbow injury 07/25/2017   Surgery for torn tendon  . Essential hypertension   . Fatty liver disease, nonalcoholic   . Lateral epicondylitis of right elbow   . Mixed hyperlipidemia   . Obesity   . Osgood-Schlatter's disease of right knee   . Skin cyst 10/03/2017   Removed from back  . Type 2 diabetes mellitus ([place])     Past Surgical History:  Procedure Laterality Date  . BIOPSY  01/21/2019   Procedure: BIOPSY;  Surgeon: [name], MD;  [facility];  Service: Endoscopy;;  gastric bx and gastric polyps  . CARDIAC CATHETERIZATION  2011  . CARDIAC CATHETERIZATION N/A 09/20/2015   Procedure: Left Heart Cath and Coronary Angiography;  Surgeon: [name], MD;  Location: [place] [facility] LAB;  Service: Cardiovascular;  Laterality: N/A;  . CHOLECYSTECTOMY  2003  . COLONOSCOPY  06/19/2012   Procedure: COLONOSCOPY;  Surgeon: [name], MD;  [facility];  Service: Endoscopy;  Laterality: N/A;  930  . COLONOSCOPY N/A 10/17/2017   Procedure: COLONOSCOPY;  Surgeon: [name], MD;  [facility];  Service: Endoscopy;  Laterality: N/A;  1225  . CYST EXCISION  07/2019  . ESOPHAGOGASTRODUODENOSCOPY (EGD) WITH PROPOFOL N/A 01/21/2019   Procedure: ESOPHAGOGASTRODUODENOSCOPY (EGD) WITH PROPOFOL;  Surgeon: [name], MD;  [facility];  Service: Endoscopy;  Laterality: N/A;   10:15am  . KNEE ARTHROPLASTY Right 1999  . LEFT HEART CATH AND CORONARY ANGIOGRAPHY N/A 02/26/2018   Procedure: LEFT HEART CATH AND CORONARY ANGIOGRAPHY;  Surgeon: [name], MD;  Location: [place] [facility] LAB;  Service: Cardiovascular;  Laterality: N/A;  . LEFT HEART CATHETERIZATION WITH CORONARY ANGIOGRAM N/A 12/09/2013   Procedure: LEFT HEART CATHETERIZATION WITH CORONARY ANGIOGRAM;  Surgeon: [name], MD;  Location: [facility] CATH LAB;  Service: Cardiovascular;  Laterality: N/A;  . Liver biopsy    . POLYPECTOMY  10/17/2017   Procedure: POLYPECTOMY;  Surgeon: [name], MD;  [facility];  Service: Endoscopy;;  ascending colon, splenic flexure,sigmoid x2  . TENNIS ELBOW RELEASE/NIRSCHEL PROCEDURE Right 07/25/2017   Procedure: TENNIS ELBOW RELEASE and debriedment;  Surgeon: [name], MD;  Location: [facility];  Service: Orthopedics;  Laterality: Right;  .  TONSILLECTOMY  1970's    Family Psychiatric History: see below  Family History:  Family History  Problem Relation Age of Onset  . Osteoporosis Mother   . Cancer Maternal Aunt   . CVA Maternal Grandmother   . CVA Maternal Grandfather   . Healthy Son     Social History:  Social History   Socioeconomic History  . Marital status: Married    Spouse name: Not on file  . Number of children: Not on file  . Years of education: Not on file  . Highest education level: Not on file  Occupational History  . Not on file  Tobacco Use  . Smoking status: Former Smoker    Packs/day: 0.50    Years: 10.00    Pack years: 5.00    Types: Cigarettes    Start date: 02/02/1973    Quit date: 09/17/1983    Years since quitting: 37.1  . Smokeless tobacco: Former Systems developer    Types: [place] date: 09/17/1983  Vaping Use  . Vaping Use: Never used  Substance and Sexual Activity  . Alcohol use: No    Alcohol/week: 0.0 standard drinks  . Drug use: No  . Sexual activity: Yes  Other Topics Concern  . 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    Married with 1 child   Right handed    7 th    Very little caffeine   Social Determinants of Radio broadcast assistant Strain: Not on file  Food Insecurity: Not on file  Transportation Needs: Not on file  Physical Activity: Not on file  Stress: Not on file  Social Connections: Not on file    Allergies:  Allergies  Allergen Reactions  . Metoprolol Rash    Metabolic Disorder Labs: Lab Results  Component Value Date   HGBA1C 6.6 10/18/2020   MPG 211.6 07/12/2018   MPG 197.25 07/23/2017   No results found for: PROLACTIN Lab Results  Component Value Date   CHOL 116 10/18/2018   TRIG 173 (H) 10/18/2018   HDL 30 (L) 10/18/2018   CHOLHDL 3.9 10/18/2018   VLDL UNABLE TO CALCULATE IF TRIGLYCERIDE OVER 400 mg/dL 07/12/2018   LDLCALC 51 10/18/2018   LDLCALC UNABLE TO CALCULATE IF TRIGLYCERIDE OVER 400 mg/dL 07/12/2018   Lab Results  Component Value Date   TSH 0.648 08/20/2019   TSH 0.65 08/20/2019    Therapeutic Level Labs: No results found for: LITHIUM No results found for: VALPROATE No components found for:  CBMZ  Current Medications: Current Outpatient Medications  Medication Sig Dispense Refill  . ALPRAZolam (XANAX) 1 MG tablet Take 1 tablet (1 mg total) by mouth 3 (three) times daily as needed for anxiety. 90 tablet 2  . albuterol (VENTOLIN HFA) 108 (90 Base) MCG/ACT inhaler INHALE 2 PUFFS INTO LUNGS EVERY 6 HOURS AS NEEDED FOR WHEEZING AND SHORTNESS OF BREATH. (Patient taking differently: Inhale 2 puffs into the lungs every 6 (six) hours as needed for shortness of breath or wheezing. INHALE 2 PUFFS INTO LUNGS EVERY 6 HOURS AS NEEDED FOR WHEEZING AND SHORTNESS OF BREATH.) 8.5 g 5  . aspirin 81 MG EC tablet Take 1 tablet (81 mg total) by mouth daily. 30 tablet 12  . atorvastatin (LIPITOR) 40 MG tablet Take 1 tablet (40 mg total) by mouth  daily. 90 tablet 1  . B-D UF III MINI PEN NEEDLES 31G  X 5 MM MISC USE UP TO 4 TIMES DAILY WITH INSULIN AS DIRECTED. 150 each 5  . bisoprolol (ZEBETA) 5 MG tablet Take 1 tablet (5 mg total) by mouth daily. 90 tablet 2  . cetirizine (ZYRTEC) 10 MG tablet Take 1 tablet (10 mg total) by mouth daily. (Patient taking differently: Take 10 mg by mouth daily as needed for allergies.) 30 tablet 0  . Cholecalciferol (VITAMIN D3) 125 MCG (5000 UT) CAPS Take 1 capsule (5,000 Units total) by mouth daily. 90 capsule 0  . Continuous Blood Gluc Sensor (FREESTYLE LIBRE 14 DAY SENSOR) MISC Inject 1 each into the skin every 14 (fourteen) days. Use as directed. 2 each 2  . dapagliflozin propanediol (FARXIGA) 5 MG TABS tablet Take 5 mg by mouth daily.    Marland Kitchen dicyclomine (BENTYL) 10 MG capsule Take 1 capsule (10 mg total) by mouth 2 (two) times daily before a meal. 60 capsule 5  . Exenatide ER (BYDUREON BCISE) 2 MG/0.85ML AUIJ Inject 2 mg into the skin once a week.    . fluticasone (FLONASE) 50 MCG/ACT nasal spray Place 2 sprays into both nostrils daily. (Patient taking differently: Place 2 sprays into both nostrils daily as needed for allergies.) 16 g 0  . fluticasone-salmeterol (ADVAIR HFA) 115-21 MCG/ACT inhaler Inhale 2 puffs into the lungs 2 (two) times daily. 1 Inhaler 5  . gabapentin (NEURONTIN) 300 MG capsule TAKE ONE (1) CAPSULE BY MOUTH 4 TIMES DAILY (Patient taking differently: Take 300 mg by mouth 2 (two) times daily.) 120 capsule 5  . HUMALOG KWIKPEN 100 UNIT/ML KwikPen Inject 25-35 Units into the skin 3 (three) times daily.    . hyoscyamine (LEVSIN SL) 0.125 MG SL tablet Place 1 tablet (0.125 mg total) under the tongue every 4 (four) hours as needed. 30 tablet 0  . ipratropium (ATROVENT) 0.06 % nasal spray Place 2 sprays into both nostrils in the morning and at bedtime.    . Lancets MISC 1 each by Does not apply route 4 (four) times daily. 150 each 5  . lidocaine (LIDODERM) 5 % Place 1 patch onto the skin  daily. Place 1 patch to your are of most significant pain on your neck once per day as needed. Remove & Discard patch within 12 hours (Patient taking differently: Place 1 patch onto the skin daily as needed (pain). Place 1 patch to your are of most significant pain on your neck once per day as needed. Remove & Discard patch within 12 hours) 30 patch 0  . losartan (COZAAR) 25 MG tablet TAKE (1) TABLET BY MOUTH ONCE DAILY. 90 tablet 3  . metFORMIN (GLUCOPHAGE) 500 MG tablet TAKE 2 TABLETS BY MOUTH TWICE DAILY WITH A MEAL. (Patient taking differently: Take 1,000 mg by mouth 2 (two) times daily with a meal.) 120 tablet 0  . methocarbamol (ROBAXIN) 500 MG tablet Take 500 mg by mouth every 8 (eight) hours as needed for muscle spasms.    . nitroGLYCERIN (NITROSTAT) 0.4 MG SL tablet Place 1 tablet (0.4 mg total) under the tongue every 5 (five) minutes as needed. 25 tablet 3  . ondansetron (ZOFRAN) 4 MG tablet Take 4 mg by mouth every 8 (eight) hours as needed for nausea or vomiting.    . pantoprazole (PROTONIX) 40 MG tablet Take 1 tablet (40 mg total) by mouth 2 (two) times daily before a meal. TAKE (1) TABLET BY MOUTH ONCE DAILY. 60 tablet 3  . ranolazine (RANEXA) 1000 MG SR tablet TAKE (1) TABLET BY MOUTH  TWICE DAILY. (Patient taking differently: Take 1,000 mg by mouth 2 (two) times daily.) 60 tablet 11  . sertraline (ZOLOFT) 100 MG tablet Take 1.5 tablets (150 mg total) by mouth daily. 135 tablet 2  . sucralfate (CARAFATE) 1 g tablet Take 1 tablet (1 g total) by mouth 2 (two) times daily before a meal. 60 tablet 3  . tiZANidine (ZANAFLEX) 4 MG capsule Take 1 capsule (4 mg total) by mouth 3 (three) times daily as needed for muscle spasms. 21 capsule 0  . TOUJEO MAX SOLOSTAR 300 UNIT/ML Solostar Pen INJECT 130 UNITS INTO THE SKIN AT BEDTIME. (Patient taking differently: Inject 70 Units into the skin at bedtime.) 12 mL 0  . traZODone (DESYREL) 50 MG tablet Take 1 tablet (50 mg total) by mouth at bedtime as  needed. 30 tablet 2   No current facility-administered medications for this visit.     Musculoskeletal: Strength & Muscle Tone: within normal limits Gait & Station: normal Patient leans: N/A  Psychiatric Specialty Exam: Review of Systems  Musculoskeletal: Positive for arthralgias and neck pain.  Neurological: Positive for tremors.  Psychiatric/Behavioral: The patient is nervous/anxious.   All other systems reviewed and are negative.   There were no vitals taken for this visit.There is no height or weight on file to calculate BMI.  General Appearance: NA  Eye Contact:  NA  Speech:  Clear and Coherent  Volume:  Normal  Mood:  Anxious  Affect:  NA  Thought Process:  Goal Directed  Orientation:  Full (Time, Place, and Person)  Thought Content: Rumination   Suicidal Thoughts:  No  Homicidal Thoughts:  No  Memory:  Immediate;   Good Recent;   Good Remote;   Good  Judgement:  Good  Insight:  Good  Psychomotor Activity:  Normal  Concentration:  Concentration: Good and Attention Span: Good  Recall:  Good  Fund of Knowledge: Good  Language: Good  Akathisia:  No  Handed:  Right  AIMS (if indicated): not done  Assets:  Communication Skills Desire for Improvement Resilience Social Support Talents/Skills  ADL's:  Intact  Cognition: WNL  Sleep:  Good   Screenings: GAD-7   Flowsheet Row Office Visit from 08/08/2020 in [place] Visit from 08/05/2018 in [place]  Total GAD-7 Score 3 5    PHQ2-9   [place] Visit from 08/08/2020 in [place] Visit from 12/09/2019 in [place] from 08/06/2018 in [facility] Office Visit from 08/05/2018 in [place] Visit from 07/24/2018 in [facility]  PHQ-2 Total Score 4 5 0 1 0  PHQ-9 Total Score 10 11 - 3 -       Assessment and Plan: This patient  is a 60 year old male with a history of depression anxiety and chronic pain.  He is dealing with the stress of his wife's condition.  We will therefore return to Xanax but increase the dosage to 1 mg 3 times daily only if needed for anxiety.  He will continue Zoloft 150 mg daily for depression and trazodone 50 mg at bedtime for sleep.  He will return to see me in 3 months   [name], MD 11/02/2020, 10:03 AM

## 2020-11-02 NOTE — Telephone Encounter (Signed)
Refilled Ranexa 1,000 mg BID to [facility]

## 2020-11-04 ENCOUNTER — Telehealth: Payer: Self-pay | Admitting: Neurology

## 2020-11-04 ENCOUNTER — Other Ambulatory Visit: Payer: Self-pay

## 2020-11-04 DIAGNOSIS — R251 Tremor, unspecified: Secondary | ICD-10-CM

## 2020-11-04 DIAGNOSIS — M542 Cervicalgia: Secondary | ICD-10-CM

## 2020-11-04 NOTE — Telephone Encounter (Signed)
See 05/27/20 message.  Okay to refer and send MRI

## 2020-11-04 NOTE — Telephone Encounter (Signed)
Patient advised.

## 2020-11-04 NOTE — Telephone Encounter (Signed)
Okay to do so to refer out?

## 2020-11-04 NOTE — Telephone Encounter (Signed)
Patient called in stating he was told at his last visit he was told to see if Dr. Ernestina Patches could help him with his neck. He has seen Dr. Ernestina Patches and they have tried a few things and that has not worked out. He thinks he needs a referral to a neurosurgeon.

## 2020-11-04 NOTE — Telephone Encounter (Signed)
Sent to [facility] per Dr.Tat,notes referral placed.

## 2020-11-07 ENCOUNTER — Other Ambulatory Visit: Payer: Self-pay

## 2020-11-07 ENCOUNTER — Encounter (HOSPITAL_COMMUNITY): Payer: Self-pay | Admitting: Gastroenterology

## 2020-11-07 ENCOUNTER — Other Ambulatory Visit (HOSPITAL_COMMUNITY)
Admission: RE | Admit: 2020-11-07 | Discharge: 2020-11-07 | Disposition: A | Discharge status: Home / Self Care | Payer: Medicare Other | Source: Ambulatory Visit | Attending: Gastroenterology | Admitting: Gastroenterology

## 2020-11-07 DIAGNOSIS — D122 Benign neoplasm of ascending colon: Secondary | ICD-10-CM | POA: Diagnosis not present

## 2020-11-07 DIAGNOSIS — Z79899 Other long term (current) drug therapy: Secondary | ICD-10-CM | POA: Diagnosis not present

## 2020-11-07 DIAGNOSIS — Z20822 Contact with and (suspected) exposure to covid-19: Secondary | ICD-10-CM | POA: Diagnosis not present

## 2020-11-07 DIAGNOSIS — Z794 Long term (current) use of insulin: Secondary | ICD-10-CM | POA: Diagnosis not present

## 2020-11-07 DIAGNOSIS — D123 Benign neoplasm of transverse colon: Secondary | ICD-10-CM | POA: Diagnosis not present

## 2020-11-07 DIAGNOSIS — Z888 Allergy status to other drugs, medicaments and biological substances status: Secondary | ICD-10-CM | POA: Diagnosis not present

## 2020-11-07 DIAGNOSIS — Z7984 Long term (current) use of oral hypoglycemic drugs: Secondary | ICD-10-CM | POA: Diagnosis not present

## 2020-11-07 DIAGNOSIS — D124 Benign neoplasm of descending colon: Secondary | ICD-10-CM | POA: Diagnosis not present

## 2020-11-07 DIAGNOSIS — Z7951 Long term (current) use of inhaled steroids: Secondary | ICD-10-CM | POA: Diagnosis not present

## 2020-11-07 DIAGNOSIS — Z01812 Encounter for preprocedural laboratory examination: Secondary | ICD-10-CM | POA: Insufficient documentation

## 2020-11-07 DIAGNOSIS — Z87891 Personal history of nicotine dependence: Secondary | ICD-10-CM | POA: Diagnosis not present

## 2020-11-07 DIAGNOSIS — Z7982 Long term (current) use of aspirin: Secondary | ICD-10-CM | POA: Diagnosis not present

## 2020-11-07 DIAGNOSIS — K529 Noninfective gastroenteritis and colitis, unspecified: Secondary | ICD-10-CM | POA: Diagnosis not present

## 2020-11-07 DIAGNOSIS — R197 Diarrhea, unspecified: Secondary | ICD-10-CM | POA: Diagnosis present

## 2020-11-07 DIAGNOSIS — K648 Other hemorrhoids: Secondary | ICD-10-CM | POA: Diagnosis not present

## 2020-11-07 LAB — BASIC METABOLIC PANEL
Anion gap: 11 (ref 5–15)
BUN: 17 mg/dL (ref 6–20)
CO2: 24 mmol/L (ref 22–32)
Calcium: 9.4 mg/dL (ref 8.9–10.3)
Chloride: 101 mmol/L (ref 98–111)
Creatinine, Ser: 1.07 mg/dL (ref 0.61–1.24)
GFR, Estimated: >60 mL/min (ref >60)
Glucose, Bld: 191 mg/dL — ABNORMAL HIGH (ref 70–99)
Potassium: 4 mmol/L (ref 3.5–5.1)
Sodium: 136 mmol/L (ref 135–145)

## 2020-11-07 LAB — SARS CORONAVIRUS 2 (TAT 6-24 HRS): SARS Coronavirus 2: NEGATIVE

## 2020-11-08 ENCOUNTER — Encounter (HOSPITAL_COMMUNITY)
Admission: RE | Disposition: A | Discharge status: Home / Self Care | Payer: Self-pay | Source: Home / Self Care | Attending: Gastroenterology

## 2020-11-08 ENCOUNTER — Encounter (HOSPITAL_COMMUNITY): Payer: Self-pay | Admitting: Gastroenterology

## 2020-11-08 ENCOUNTER — Ambulatory Visit (HOSPITAL_COMMUNITY)
Admission: RE | Admit: 2020-11-08 | Discharge: 2020-11-08 | Disposition: A | Discharge status: Home / Self Care | Payer: Medicare Other | Attending: Gastroenterology | Admitting: Gastroenterology

## 2020-11-08 ENCOUNTER — Ambulatory Visit (HOSPITAL_COMMUNITY): Payer: Medicare Other | Admitting: Anesthesiology

## 2020-11-08 ENCOUNTER — Other Ambulatory Visit: Payer: Self-pay

## 2020-11-08 DIAGNOSIS — Z7951 Long term (current) use of inhaled steroids: Secondary | ICD-10-CM | POA: Diagnosis not present

## 2020-11-08 DIAGNOSIS — D124 Benign neoplasm of descending colon: Secondary | ICD-10-CM | POA: Insufficient documentation

## 2020-11-08 DIAGNOSIS — K648 Other hemorrhoids: Secondary | ICD-10-CM

## 2020-11-08 DIAGNOSIS — R197 Diarrhea, unspecified: Secondary | ICD-10-CM

## 2020-11-08 DIAGNOSIS — D122 Benign neoplasm of ascending colon: Secondary | ICD-10-CM

## 2020-11-08 DIAGNOSIS — Z87891 Personal history of nicotine dependence: Secondary | ICD-10-CM | POA: Diagnosis not present

## 2020-11-08 DIAGNOSIS — D123 Benign neoplasm of transverse colon: Secondary | ICD-10-CM

## 2020-11-08 DIAGNOSIS — D125 Benign neoplasm of sigmoid colon: Secondary | ICD-10-CM

## 2020-11-08 DIAGNOSIS — Z7982 Long term (current) use of aspirin: Secondary | ICD-10-CM | POA: Insufficient documentation

## 2020-11-08 DIAGNOSIS — J449 Chronic obstructive pulmonary disease, unspecified: Secondary | ICD-10-CM | POA: Diagnosis not present

## 2020-11-08 DIAGNOSIS — Z794 Long term (current) use of insulin: Secondary | ICD-10-CM | POA: Insufficient documentation

## 2020-11-08 DIAGNOSIS — K635 Polyp of colon: Secondary | ICD-10-CM | POA: Diagnosis not present

## 2020-11-08 DIAGNOSIS — K529 Noninfective gastroenteritis and colitis, unspecified: Secondary | ICD-10-CM | POA: Insufficient documentation

## 2020-11-08 DIAGNOSIS — Z79899 Other long term (current) drug therapy: Secondary | ICD-10-CM | POA: Insufficient documentation

## 2020-11-08 DIAGNOSIS — Z888 Allergy status to other drugs, medicaments and biological substances status: Secondary | ICD-10-CM | POA: Diagnosis not present

## 2020-11-08 DIAGNOSIS — Z7984 Long term (current) use of oral hypoglycemic drugs: Secondary | ICD-10-CM | POA: Diagnosis not present

## 2020-11-08 DIAGNOSIS — D175 Benign lipomatous neoplasm of intra-abdominal organs: Secondary | ICD-10-CM

## 2020-11-08 HISTORY — PX: BIOPSY: SHX5522

## 2020-11-08 HISTORY — PX: POLYPECTOMY: SHX5525

## 2020-11-08 HISTORY — PX: COLONOSCOPY WITH PROPOFOL: SHX5780

## 2020-11-08 LAB — HM COLONOSCOPY

## 2020-11-08 LAB — GLUCOSE, CAPILLARY: Glucose-Capillary: 215 mg/dL — ABNORMAL HIGH (ref 70–99)

## 2020-11-08 SURGERY — COLONOSCOPY WITH PROPOFOL
Anesthesia: General

## 2020-11-08 MED ORDER — LACTATED RINGERS IV SOLN: INTRAVENOUS | Status: DC

## 2020-11-08 MED ORDER — PROPOFOL 10 MG/ML IV BOLUS
INTRAVENOUS | Status: AC
  Filled 2020-11-08: qty 40

## 2020-11-08 MED ORDER — PROPOFOL 10 MG/ML IV BOLUS
INTRAVENOUS | Status: AC
  Filled 2020-11-08: qty 20

## 2020-11-08 MED ORDER — PROPOFOL 500 MG/50ML IV EMUL
INTRAVENOUS | Status: DC | PRN
  Administered 2020-11-08: 150 ug/kg/min via INTRAVENOUS

## 2020-11-08 MED ORDER — PROPOFOL 10 MG/ML IV BOLUS
INTRAVENOUS | Status: DC | PRN
  Administered 2020-11-08: 100 ug via INTRAVENOUS
  Administered 2020-11-08: 30 ug via INTRAVENOUS

## 2020-11-08 NOTE — Discharge Instructions (Signed)
You are being discharged to home.  Low FODMAP diet.  We are waiting for your pathology results.  Your physician has recommended a repeat colonoscopy in six months for surveillance after today's piecemeal polypectomy.  Can take Imodium as needed for diarrhea.   Colon Polyps  Colon polyps are tissue growths inside the colon, which is part of the large intestine. They are one of the types of polyps that can grow in the body. A polyp may be a round bump or a mushroom-shaped growth. You could have one polyp or more than one. Most colon polyps are noncancerous (benign). However, some colon polyps can become cancerous over time. Finding and removing the polyps early can help prevent this. What are the causes? The exact cause of colon polyps is not known. What increases the risk? The following factors may make you more likely to develop this condition:  Having a family history of colorectal cancer or colon polyps.  Being older than 59 years of age.  Being younger than 59 years of age and having a significant family history of colorectal cancer or colon polyps or a genetic condition that puts you at higher risk of getting colon polyps.  Having inflammatory bowel disease, such as ulcerative colitis or Crohn's disease.  Having certain conditions passed from parent to child (hereditary conditions), such as: ? Familial adenomatous polyposis (FAP). ? Lynch syndrome. ? Turcot syndrome. ? Peutz-Jeghers syndrome. ? MUTYH-associated polyposis (MAP).  Being overweight.  Certain lifestyle factors. These include smoking cigarettes, drinking too much alcohol, not getting enough exercise, and eating a diet that is high in fat and red meat and low in fiber.  Having had childhood cancer that was treated with radiation of the abdomen. What are the signs or symptoms? Many times, there are no symptoms. If you have symptoms, they may include:  Blood coming from the rectum during a bowel movement.  Blood  in the stool (feces). The blood may be bright red or very dark in color.  Pain in the abdomen.  A change in bowel habits, such as constipation or diarrhea. How is this diagnosed? This condition is diagnosed with a colonoscopy. This is a procedure in which a lighted, flexible scope is inserted into the opening between the buttocks (anus) and then passed into the colon to examine the area. Polyps are sometimes found when a colonoscopy is done as part of routine cancer screening tests. How is this treated? This condition is treated by removing any polyps that are found. Most polyps can be removed during a colonoscopy. Those polyps will then be tested for cancer. Additional treatment may be needed depending on the results of testing. Follow these instructions at home: Eating and drinking  Eat foods that are high in fiber, such as fruits, vegetables, and whole grains.  Eat foods that are high in calcium and vitamin D, such as milk, cheese, yogurt, eggs, liver, fish, and broccoli.  Limit foods that are high in fat, such as fried foods and desserts.  Limit the amount of red meat, precooked or cured meat, or other processed meat that you eat, such as hot dogs, sausages, bacon, or meat loaves.  Limit sugary drinks.   Lifestyle  Maintain a healthy weight, or lose weight if recommended by your health care provider.  Exercise every day or as told by your health care provider.  Do not use any products that contain nicotine or tobacco, such as cigarettes, e-cigarettes, and chewing tobacco. If you need help quitting, ask your  health care provider.  Do not drink alcohol if: ? Your health care provider tells you not to drink. ? You are pregnant, may be pregnant, or are planning to become pregnant.  If you drink alcohol: ? Limit how much you use to:  0-1 drink a day for women.  0-2 drinks a day for men. ? Know how much alcohol is in your drink. In the [place] one drink equals one 12 oz bottle of  beer (355 mL), one 5 oz glass of wine (148 mL), or one 1 oz glass of hard liquor (44 mL). General instructions  Take over-the-counter and prescription medicines only as told by your health care provider.  Keep all follow-up visits. This is important. This includes having regularly scheduled colonoscopies. Talk to your health care provider about when you need a colonoscopy. Contact a health care provider if:  You have new or worsening bleeding during a bowel movement.  You have new or increased blood in your stool.  You have a change in bowel habits.  You lose weight for no known reason. Summary  Colon polyps are tissue growths inside the colon, which is part of the large intestine. They are one type of polyp that can grow in the body.  Most colon polyps are noncancerous (benign), but some can become cancerous over time.  This condition is diagnosed with a colonoscopy.  This condition is treated by removing any polyps that are found. Most polyps can be removed during a colonoscopy. This information is not intended to replace advice given to you by your health care provider. Make sure you discuss any questions you have with your health care provider. Document Revised: 12/23/2019 Document Reviewed: 12/23/2019 Elsevier Patient Education  2021 [place].  Colonoscopy, Adult, Care After This sheet gives you information about how to care for yourself after your procedure. Your doctor may also give you more specific instructions. If you have problems or questions, call your doctor. What can I expect after the procedure? After the procedure, it is common to have:  A small amount of blood in your poop (stool) for 24 hours.  Some gas.  Mild cramping or bloating in your belly (abdomen). Follow these instructions at home: Eating and drinking  Drink enough fluid to keep your pee (urine) pale yellow.  Follow instructions from your doctor about what you cannot eat or drink.  Return  to your normal diet as told by your doctor. Avoid heavy or fried foods that are hard to digest.   Activity  Rest as told by your doctor.  Do not sit for a long time without moving. Get up to take short walks every 1-2 hours. This is important. Ask for help if you feel weak or unsteady.  Return to your normal activities as told by your doctor. Ask your doctor what activities are safe for you. To help cramping and bloating:  Try walking around.  Put heat on your belly as told by your doctor. Use the heat source that your doctor recommends, such as a moist heat pack or a heating pad. ? Put a towel between your skin and the heat source. ? Leave the heat on for 20-30 minutes. ? Remove the heat if your skin turns bright red. This is very important if you are unable to feel pain, heat, or cold. You may have a greater risk of getting burned.   General instructions  If you were given a medicine to help you relax (sedative) during your procedure, it  can affect you for many hours. Do not drive or use machinery until your doctor says that it is safe.  For the first 24 hours after the procedure: ? Do not sign important documents. ? Do not drink alcohol. ? Do your daily activities more slowly than normal. ? Eat foods that are soft and easy to digest.  Take over-the-counter or prescription medicines only as told by your doctor.  Keep all follow-up visits as told by your doctor. This is important. Contact a doctor if:  You have blood in your poop 2-3 days after the procedure. Get help right away if:  You have more than a small amount of blood in your poop.  You see large clumps of tissue (blood clots) in your poop.  Your belly is swollen.  You feel like you may vomit (nauseous).  You vomit.  You have a fever.  You have belly pain that gets worse, and medicine does not help your pain. Summary  After the procedure, it is common to have a small amount of blood in your poop. You may also  have mild cramping and bloating in your belly.  If you were given a medicine to help you relax (sedative) during your procedure, it can affect you for many hours. Do not drive or use machinery until your doctor says that it is safe.  Get help right away if you have a lot of blood in your poop, feel like you may vomit, have a fever, or have more belly pain. This information is not intended to replace advice given to you by your health care provider. Make sure you discuss any questions you have with your health care provider. Document Revised: 07/10/2019 Document Reviewed: 03/30/2019 Elsevier Patient Education  [place].

## 2020-11-08 NOTE — H&P (Signed)
Jared Griffin is an 60 y.o. male.   Chief Complaint: Chronic diarrhea and abdominal pain HPI: Jared Griffin is a 60 y.o. male with PMH COPD, CAD s/p stent, diabetes c/b neuropathy , HLD, NASH who presents for follow up of abdominal pain and chronic diarrhea.  Patient reports that history of 1.5 years of intermittent episodes of epigastric abdominal pain that is intermittent in nature.  He has taken Levsin and Bentyl intermittently but this has not led to improvement of his symptoms.  He also has been episodes of watery diarrhea up to eight times per day on and off for multiple months without having significant weight loss.  He had an EGD on 2020 that did not show any etiology for the abdominal pain but only presence of gastric polyps and erythema in the gastric body but negative for H. Pylori.  Last abdominal imaging performed was on 08/02/2020 which showed stable hepatosplenomegaly and hepatic acidosis but no other alterations.  Last Colonoscopy: 2019 -3 polyps removed in the sigmoid and ascending colon between 6 to 7 mm in size (pathology showed fragments of tubular adenoma with focal high-grade dysplasia), a 5 mm polyp was removed from the splenic flexure (pathology showed fragments of tubular adenoma with focal high-grade dysplasia), internal hemorrhoids.  There was a 12 mm lipoma at the hepatic flexure.  Past Medical History:  Diagnosis Date  . Anxiety   . COPD (chronic obstructive pulmonary disease) ([place])   . Coronary atherosclerosis of native coronary artery    a. 12/09/2013: s/p PCI with 3.0 x 28 Promus DES to mLAD  . Depression   . Diabetic neuropathy ([place])   . Elbow injury 07/25/2017   Surgery for torn tendon  . Essential hypertension   . Fatty liver disease, nonalcoholic   . Lateral epicondylitis of right elbow   . Mixed hyperlipidemia   . Obesity   . Osgood-Schlatter's disease of right knee   . Skin cyst 10/03/2017   Removed from back  . Type 2 diabetes mellitus  ([place])     Past Surgical History:  Procedure Laterality Date  . BIOPSY  01/21/2019   Procedure: BIOPSY;  Surgeon: [name], MD;  [facility];  Service: Endoscopy;;  gastric bx and gastric polyps  . CARDIAC CATHETERIZATION  2011  . CARDIAC CATHETERIZATION N/A 09/20/2015   Procedure: Left Heart Cath and Coronary Angiography;  Surgeon: [name], MD;  Location: [facility];  Service: Cardiovascular;  Laterality: N/A;  . CHOLECYSTECTOMY  2003  . COLONOSCOPY  06/19/2012   Procedure: COLONOSCOPY;  Surgeon: [name], MD;  [facility];  Service: Endoscopy;  Laterality: N/A;  930  . COLONOSCOPY N/A 10/17/2017   Procedure: COLONOSCOPY;  Surgeon: [name], MD;  [facility];  Service: Endoscopy;  Laterality: N/A;  1225  . CYST EXCISION  07/2019  . ESOPHAGOGASTRODUODENOSCOPY (EGD) WITH PROPOFOL N/A 01/21/2019   Procedure: ESOPHAGOGASTRODUODENOSCOPY (EGD) WITH PROPOFOL;  Surgeon: [name], MD;  [facility];  Service: Endoscopy;  Laterality: N/A;  10:15am  . KNEE ARTHROPLASTY Right 1999  . LEFT HEART CATH AND CORONARY ANGIOGRAPHY N/A 02/26/2018   Procedure: LEFT HEART CATH AND CORONARY ANGIOGRAPHY;  Surgeon: [name], MD;  Location: [facility];  Service: Cardiovascular;  Laterality: N/A;  . LEFT HEART CATHETERIZATION WITH CORONARY ANGIOGRAM N/A 12/09/2013   Procedure: LEFT HEART CATHETERIZATION WITH CORONARY ANGIOGRAM;  Surgeon: [name], MD;  Location: [facility] C[facility]  LAB;  Service: Cardiovascular;  Laterality: N/A;  . Liver biopsy    . POLYPECTOMY  10/17/2017   Procedure: POLYPECTOMY;  Surgeon: [name], MD;  [facility];  Service: Endoscopy;;  ascending colon, splenic flexure,sigmoid x2  . TENNIS ELBOW RELEASE/NIRSCHEL PROCEDURE Right 07/25/2017   Procedure: TENNIS ELBOW RELEASE and debriedment;  Surgeon: [name], MD;  Location: [facility];  Service: Orthopedics;   Laterality: Right;  . TONSILLECTOMY  1970's    Family History  Problem Relation Age of Onset  . Osteoporosis Mother   . Cancer Maternal Aunt   . CVA Maternal Grandmother   . CVA Maternal Grandfather   . Healthy Son    Social History:  reports that he quit smoking about 37 years ago. His smoking use included cigarettes. He started smoking about 47 years ago. He has a 5.00 pack-year smoking history. He quit smokeless tobacco use about 37 years ago.  His smokeless tobacco use included chew. He reports that he does not drink alcohol and does not use drugs.  Allergies:  Allergies  Allergen Reactions  . Metoprolol Rash    Medications Prior to Admission  Medication Sig Dispense Refill  . acetaminophen (TYLENOL) 500 MG tablet Take 1,500 mg by mouth every 8 (eight) hours as needed for moderate pain.    Marland Kitchen albuterol (VENTOLIN HFA) 108 (90 Base) MCG/ACT inhaler INHALE 2 PUFFS INTO LUNGS EVERY 6 HOURS AS NEEDED FOR WHEEZING AND SHORTNESS OF BREATH. (Patient taking differently: Inhale 2 puffs into the lungs every 6 (six) hours as needed for shortness of breath or wheezing. INHALE 2 PUFFS INTO LUNGS EVERY 6 HOURS AS NEEDED FOR WHEEZING AND SHORTNESS OF BREATH.) 8.5 g 5  . ALPRAZolam (XANAX) 1 MG tablet Take 1 tablet (1 mg total) by mouth 3 (three) times daily as needed for anxiety. 90 tablet 2  . aspirin 81 MG EC tablet Take 1 tablet (81 mg total) by mouth daily. 30 tablet 12  . atorvastatin (LIPITOR) 40 MG tablet Take 1 tablet (40 mg total) by mouth daily. 90 tablet 1  . B-D UF III MINI PEN NEEDLES 31G X 5 MM MISC USE UP TO 4 TIMES DAILY WITH INSULIN AS DIRECTED. 150 each 5  . bisoprolol (ZEBETA) 5 MG tablet Take 1 tablet (5 mg total) by mouth daily. 90 tablet 2  . cetirizine (ZYRTEC) 10 MG tablet Take 1 tablet (10 mg total) by mouth daily. (Patient taking differently: Take 10 mg by mouth daily as needed for allergies.) 30 tablet 0  . Continuous Blood Gluc Sensor (FREESTYLE LIBRE 14 DAY SENSOR) MISC  Inject 1 each into the skin every 14 (fourteen) days. Use as directed. 2 each 2  . dapagliflozin propanediol (FARXIGA) 5 MG TABS tablet Take 5 mg by mouth daily.    Marland Kitchen dicyclomine (BENTYL) 10 MG capsule Take 1 capsule (10 mg total) by mouth 2 (two) times daily before a meal. 60 capsule 5  . Exenatide ER (BYDUREON BCISE) 2 MG/0.85ML AUIJ Inject 2 mg into the skin every Monday.    . fluticasone (FLONASE) 50 MCG/ACT nasal spray Place 2 sprays into both nostrils daily. (Patient taking differently: Place 2 sprays into both nostrils daily as needed for allergies.) 16 g 0  . fluticasone-salmeterol (ADVAIR HFA) 115-21 MCG/ACT inhaler Inhale 2 puffs into the lungs 2 (two) times daily. 1 Inhaler 5  . gabapentin (NEURONTIN) 300 MG capsule TAKE ONE (1) CAPSULE BY MOUTH 4 TIMES DAILY (Patient taking differently: Take 300 mg by  mouth 2 (two) times daily.) 120 capsule 5  . HUMALOG KWIKPEN 200 UNIT/ML KwikPen Inject 35-40 Units into the skin 3 (three) times daily.    . hyoscyamine (LEVSIN SL) 0.125 MG SL tablet Place 1 tablet (0.125 mg total) under the tongue every 4 (four) hours as needed. (Patient taking differently: Place 0.125 mg under the tongue every 4 (four) hours as needed for cramping.) 30 tablet 0  . ipratropium (ATROVENT) 0.06 % nasal spray Place 2 sprays into both nostrils in the morning and at bedtime.    Marland Kitchen losartan (COZAAR) 25 MG tablet TAKE (1) TABLET BY MOUTH ONCE DAILY. (Patient taking differently: Take 25 mg by mouth daily.) 90 tablet 3  . metFORMIN (GLUCOPHAGE) 500 MG tablet TAKE 2 TABLETS BY MOUTH TWICE DAILY WITH A MEAL. (Patient taking differently: Take 1,000 mg by mouth 2 (two) times daily with a meal.) 120 tablet 0  . ondansetron (ZOFRAN) 4 MG tablet Take 4 mg by mouth every 8 (eight) hours as needed for nausea or vomiting.    . pantoprazole (PROTONIX) 40 MG tablet Take 1 tablet (40 mg total) by mouth 2 (two) times daily before a meal. TAKE (1) TABLET BY MOUTH ONCE DAILY. (Patient taking  differently: Take 40 mg by mouth 2 (two) times daily before a meal.) 60 tablet 3  . ranolazine (RANEXA) 1000 MG SR tablet Take 1 tablet (1,000 mg total) by mouth 2 (two) times daily. 180 tablet 1  . sertraline (ZOLOFT) 100 MG tablet Take 1.5 tablets (150 mg total) by mouth daily. 135 tablet 2  . TOUJEO MAX SOLOSTAR 300 UNIT/ML Solostar Pen INJECT 130 UNITS INTO THE SKIN AT BEDTIME. (Patient taking differently: Inject 60 Units into the skin at bedtime.) 12 mL 0  . Cholecalciferol (VITAMIN D3) 125 MCG (5000 UT) CAPS Take 1 capsule (5,000 Units total) by mouth daily. (Patient not taking: No sig reported) 90 capsule 0  . Lancets MISC 1 each by Does not apply route 4 (four) times daily. 150 each 5  . lidocaine (LIDODERM) 5 % Place 1 patch onto the skin daily. Place 1 patch to your are of most significant pain on your neck once per day as needed. Remove & Discard patch within 12 hours (Patient taking differently: Place 1 patch onto the skin daily as needed (pain). Place 1 patch to your are of most significant pain on your neck once per day as needed. Remove & Discard patch within 12 hours) 30 patch 0  . methocarbamol (ROBAXIN) 500 MG tablet Take 500 mg by mouth every 8 (eight) hours as needed for muscle spasms.    . nitroGLYCERIN (NITROSTAT) 0.4 MG SL tablet Place 1 tablet (0.4 mg total) under the tongue every 5 (five) minutes as needed. (Patient taking differently: Place 0.4 mg under the tongue every 5 (five) minutes as needed for chest pain.) 25 tablet 3  . sucralfate (CARAFATE) 1 g tablet Take 1 tablet (1 g total) by mouth 2 (two) times daily before a meal. (Patient not taking: Reported on 11/02/2020) 60 tablet 3  . traZODone (DESYREL) 50 MG tablet Take 1 tablet (50 mg total) by mouth at bedtime as needed. (Patient taking differently: Take 50 mg by mouth at bedtime as needed for sleep.) 30 tablet 2    Results for orders placed or performed during the hospital encounter of 11/08/20 (from the past 48 hour(s))   Basic metabolic panel     Status: Abnormal   Collection Time: 11/07/20  8:00 AM  Result Value  Ref Range   Sodium 136 135 - 145 mmol/L   Potassium 4.0 3.5 - 5.1 mmol/L   Chloride 101 98 - 111 mmol/L   CO2 24 22 - 32 mmol/L   Glucose, Bld 191 (H) 70 - 99 mg/dL    Comment: Glucose reference range applies only to samples taken after fasting for at least 8 hours.   BUN 17 6 - 20 mg/dL   Creatinine, Ser 1.07 0.61 - 1.24 mg/dL   Calcium 9.4 8.9 - 10.3 mg/dL   GFR, Estimated >60 >60 mL/min    Comment: (NOTE) Calculated using the CKD-EPI Creatinine Equation (2021)    Anion gap 11 5 - 15    Comment: Performed at [facility], [address] [place], [place] [address]  Glucose, capillary     Status: Abnormal   Collection Time: 11/08/20  9:43 AM  Result Value Ref Range   Glucose-Capillary 215 (H) 70 - 99 mg/dL    Comment: Glucose reference range applies only to samples taken after fasting for at least 8 hours.   No results found.  Review of Systems  Constitutional: Negative.   HENT: Negative.   Eyes: Negative.   Respiratory: Negative.   Cardiovascular: Negative.   Gastrointestinal: Positive for abdominal pain and diarrhea.  Endocrine: Negative.   Genitourinary: Negative.   Musculoskeletal: Negative.   Skin: Negative.   Allergic/Immunologic: Negative.   Neurological: Negative.   Hematological: Negative.   Psychiatric/Behavioral: Negative.     Blood pressure (!) 139/95, temperature 99 F (37.2 C), temperature source Oral, resp. rate 18, height 6' 2"  (1.88 m), weight 114.8 kg, SpO2 96 %. Physical Exam  GENERAL: The patient is AO x3, in no acute distress. Obese. HEENT: Head is normocephalic and atraumatic. EOMI are intact. Mouth is well hydrated and without lesions. NECK: Supple. No masses LUNGS: Clear to auscultation. No presence of rhonchi/wheezing/rales. Adequate chest expansion HEART: RRR, normal s1 and s2. ABDOMEN: Soft, nontender, no guarding, no peritoneal signs, and  nondistended. BS +. No masses. EXTREMITIES: Without any cyanosis, clubbing, rash, lesions or edema. NEUROLOGIC: AOx3, no focal motor deficit. SKIN: no jaundice, no rashes  Assessment/Plan Jared Griffin is a 60 y.o. male with PMH COPD, CAD s/p stent, diabetes c/b neuropathy , HLD, NASH who presents for follow up of abdominal pain and chronic diarrhea.  We will proceed with colonoscopy today.  Jared Quale, MD 11/08/2020, 10:30 AM

## 2020-11-08 NOTE — Transfer of Care (Signed)
Immediate Anesthesia Transfer of Care Note  Patient: Jared Griffin  Procedure(s) Performed: COLONOSCOPY WITH PROPOFOL (N/A ) BIOPSY POLYPECTOMY  Patient Location: PACU  Anesthesia Type:General  Level of Consciousness: awake, alert  and oriented  Airway & Oxygen Therapy: Patient Spontanous Breathing  Post-op Assessment: Report given to RN, Post -op Vital signs reviewed and stable and Patient moving all extremities X 4  Post vital signs: Reviewed and stable  Last Vitals:  Vitals Value Taken Time  BP    Temp    Pulse    Resp    SpO2      Last Pain:  Vitals:   11/08/20 0932  TempSrc: Oral  PainSc: 0-No pain      Patients Stated Pain Goal: 5 (96/29/52 8413)  Complications: No complications documented.

## 2020-11-08 NOTE — Op Note (Addendum)
[facility] Patient Name: Jared Griffin Procedure Date: 11/08/2020 11:22 AM MRN: [id number] Date of Birth: 08/20/1961 Attending MD: [name] ,  CSN: [id number] Age: 60 Admit Type: Outpatient Procedure:                Colonoscopy Indications:              Clinically significant diarrhea of unexplained                            origin Providers:                [name], Janeece Riggers, RN, Kristine L.                            Risa Grill, Technician Referring MD:              Medicines:                Monitored Anesthesia Care Complications:            No immediate complications. Estimated Blood Loss:     Estimated blood loss: none. Procedure:                Pre-Anesthesia Assessment:                           - Prior to the procedure, a History and Physical                            was performed, and patient medications, allergies                            and sensitivities were reviewed. The patient's                            tolerance of previous anesthesia was reviewed.                           - The risks and benefits of the procedure and the                            sedation options and risks were discussed with the                            patient. All questions were answered and informed                            consent was obtained.                           - ASA Grade Assessment: II - A patient with mild                            systemic disease.                           After obtaining informed consent, the colonoscope  was passed under direct vision. Throughout the                            procedure, the patient's blood pressure, pulse, and                            oxygen saturations were monitored continuously. The                            [id number]-[id number] ([id number]) scope was introduced through                            the anus and advanced to the the terminal ileum.                            The  colonoscopy was performed without difficulty.                            The patient tolerated the procedure well. The                            quality of the bowel preparation was adequate to                            identify polyps 6 mm and larger in size. Scope                            withdrawal time was 20 minutes. Scope In: 11:33:45 AM Scope Out: 12:28:31 PM Scope Withdrawal Time: 0 hours 49 minutes 58 seconds  Total Procedure Duration: 0 hours 54 minutes 46 seconds  Findings:      Hemorrhoids were found on perianal exam.      The terminal ileum appeared normal.      Two sessile polyps were found in the ascending colon. The polyps were 4       to 6 mm in size. These polyps were removed with a cold snare. Resection       and retrieval were complete.      Four sessile polyps were found in the transverse colon. The polyps were       3 to 8 mm in size. These polyps were removed with a cold snare.       Resection and retrieval were complete.      A 15 mm polyp was found in the mid transverse colon. The polyp was       semi-sessile. The polyp was removed with a piecemeal technique using a       hot snare. Resection and retrieval were complete. The edges of the       polypectomy site were ablated with soft coag with the tip of the snare.      There was a medium-sized lipoma, 20 mm in diameter, in the proximal       transverse colon.      A 5 mm polyp was found in the sigmoid colon. The polyp was sessile. The       polyp was removed with a cold snare. Resection and retrieval were  complete.      The rest of the examined colon appeared normal. Biopsies for histology       were taken with a cold forceps from the right colon and left colon for       evaluation of microscopic colitis.      Non-bleeding internal hemorrhoids were found during retroflexion. The       hemorrhoids were medium-sized. Impression:               - Hemorrhoids found on perianal exam.                            - The examined portion of the ileum was normal.                           - Two 4 to 6 mm polyps in the ascending colon,                            removed with a cold snare. Resected and retrieved.                           - Four 3 to 8 mm polyps in the transverse colon,                            removed with a cold snare. Resected and retrieved.                           - One 15 mm polyp in the mid transverse colon,                            removed piecemeal using a hot snare. Resected and                            retrieved.                           - Medium-sized lipoma in the proximal transverse                            colon.                           - One 5 mm polyp in the sigmoid colon, removed with                            a cold snare. Resected and retrieved.                           - The rest of the examined colon is normal.                            Biopsied.                           - Non-bleeding internal hemorrhoids. Moderate Sedation:  Per Anesthesia Care Recommendation:           - Discharge patient to home (ambulatory).                           - Low FODMAP diet.                           - Await pathology results.                           - Repeat colonoscopy in 6 months for surveillance                            after piecemeal polypectomy.                           - Can take Imodium as needed for diarrhea. Procedure Code(s):        --- Professional ---                           [phone], Colonoscopy, flexible; with removal of                            tumor(s), polyp(s), or other lesion(s) by snare                            technique                           [id number], 42, Colonoscopy, flexible; with biopsy,                            single or multiple Diagnosis Code(s):        --- Professional ---                           K64.8, Other hemorrhoids                           K63.5, Polyp of colon                           R19.7, Diarrhea,  unspecified CPT copyright 2019 American Medical Association. All rights reserved. The codes documented in this report are preliminary and upon coder review may  be revised to meet current compliance requirements. [name], MD [name],  11/08/2020 12:43:40 PM This report has been signed electronically. Number of Addenda: 0

## 2020-11-08 NOTE — Anesthesia Postprocedure Evaluation (Signed)
Anesthesia Post Note  Patient: Jared Griffin  Procedure(s) Performed: COLONOSCOPY WITH PROPOFOL (N/A ) BIOPSY POLYPECTOMY  Anesthesia Type: General Anesthetic complications: no   No complications documented.   Last Vitals:  Vitals:   11/08/20 0932 11/08/20 1234  BP: (!) 139/95 (!) 97/58  Resp: 18 20  Temp: 37.2 C 36.8 C  SpO2: 96% 95%    Last Pain:  Vitals:   11/08/20 1234  TempSrc: Oral  PainSc: 0-No pain                 Talitha Givens

## 2020-11-08 NOTE — Anesthesia Preprocedure Evaluation (Signed)
Anesthesia Evaluation  Patient identified by MRN, date of birth, ID band Patient awake    Reviewed: Allergy & Precautions, H&P , NPO status , Patient's Chart, lab work & pertinent test results, reviewed documented beta blocker date and time   Airway Mallampati: II  TM Distance: >3 FB Neck ROM: full    Dental no notable dental hx.    Pulmonary asthma , COPD, former smoker,    Pulmonary exam normal breath sounds clear to auscultation       Cardiovascular Exercise Tolerance: Good hypertension, + angina + CAD   Rhythm:regular Rate:Normal     Neuro/Psych PSYCHIATRIC DISORDERS Anxiety Depression negative neurological ROS     GI/Hepatic GERD  ,(+) Hepatitis -, Autoimmune  Endo/Other  negative endocrine ROSdiabetes  Renal/GU negative Renal ROS  negative genitourinary   Musculoskeletal   Abdominal   Peds  Hematology negative hematology ROS (+)   Anesthesia Other Findings   Reproductive/Obstetrics negative OB ROS                             Anesthesia Physical Anesthesia Plan  ASA: III  Anesthesia Plan: General   Post-op Pain Management:    Induction:   PONV Risk Score and Plan: Propofol infusion  Airway Management Planned:   Additional Equipment:   Intra-op Plan:   Post-operative Plan:   Informed Consent: I have reviewed the patients History and Physical, chart, labs and discussed the procedure including the risks, benefits and alternatives for the proposed anesthesia with the patient or authorized representative who has indicated his/her understanding and acceptance.     Dental Advisory Given  Plan Discussed with: CRNA  Anesthesia Plan Comments:         Anesthesia Quick Evaluation

## 2020-11-09 LAB — SURGICAL PATHOLOGY

## 2020-11-10 ENCOUNTER — Encounter (HOSPITAL_COMMUNITY): Payer: Self-pay | Admitting: Gastroenterology

## 2020-11-15 ENCOUNTER — Encounter (INDEPENDENT_AMBULATORY_CARE_PROVIDER_SITE_OTHER): Payer: Self-pay | Admitting: *Deleted

## 2020-11-17 ENCOUNTER — Encounter (INDEPENDENT_AMBULATORY_CARE_PROVIDER_SITE_OTHER): Payer: Self-pay | Admitting: Gastroenterology

## 2020-11-17 ENCOUNTER — Ambulatory Visit (INDEPENDENT_AMBULATORY_CARE_PROVIDER_SITE_OTHER): Payer: Medicare Other | Admitting: Gastroenterology

## 2020-11-17 ENCOUNTER — Other Ambulatory Visit: Payer: Self-pay

## 2020-11-17 VITALS — BP 120/76 | HR 82 | Temp 97.5°F | Ht 74.0 in | Wt 267.0 lb

## 2020-11-17 DIAGNOSIS — K7581 Nonalcoholic steatohepatitis (NASH): Secondary | ICD-10-CM | POA: Diagnosis not present

## 2020-11-17 DIAGNOSIS — K580 Irritable bowel syndrome with diarrhea: Secondary | ICD-10-CM

## 2020-11-17 DIAGNOSIS — K589 Irritable bowel syndrome without diarrhea: Secondary | ICD-10-CM | POA: Insufficient documentation

## 2020-11-17 NOTE — Progress Notes (Signed)
Jared Griffin, M.D. Gastroenterology & Hepatology [facility] [address] [place], [place] [address]  Primary Care Physician: [name], MD [place] [id number]  I will communicate my assessment and recommendations to the referring MD via EMR.  Problems: 1. IBS-D 2. NASH  History of Present Illness: Jared Griffin is a 60 y.o. male with has medical history COPD, CAD s/p stent, diabetes c/b neuropathy , HLD, NASH and IBS-D, who presents for follow up of diarrhea and abdominal pain.  The patient was last seen on 08/17/2020. At that time, the patient underwent multiple testing that showed normal results for hepatitis B/C serologies, iron panel, ANA, AMA, ASMA, IgG, TTG IgA.  He did not have hepatitis A or B antibodies.  He was advised to take Bentyl as needed for his abdominal pain and to implement a low FODMAP diet.  Was also advised to take Imodium as needed for his diarrhea.  The patient underwent a colonoscopy on 11/08/2020 which showed a total of 5 subcentimeter polyps in the colon, 15 mm polyp in the mid transverse colon which was resected in a piecemeal fashion, a 20 mm lipoma hemorrhoids.  All of the polyps were tubular adenomas.  Polyps showed random colon biopsies were negative for microscopic colitis.  Patient states that has not had any abdominal pain for the last month a and a half.  He has not implemented the low FODMAP diet yet.  The takes two tablets of Imodium three times a week on average which controls his episodes of diarrhea.  He has not presented any more episodes of bloating since the last time he was seen in clinic.  The patient denies having any nausea, vomiting, fever, chills, hematochezia, melena, hematemesis, abdominal distention, jaundice, pruritus or weight loss.   Past Medical History: Past Medical History:  Diagnosis Date  . Anxiety   . COPD (chronic obstructive  pulmonary disease) ([place])   . Coronary atherosclerosis of native coronary artery    a. 12/09/2013: s/p PCI with 3.0 x 28 Promus DES to mLAD  . Depression   . Diabetic neuropathy ([place])   . Elbow injury 07/25/2017   Surgery for torn tendon  . Essential hypertension   . Fatty liver disease, nonalcoholic   . Lateral epicondylitis of right elbow   . Mixed hyperlipidemia   . Obesity   . Osgood-Schlatter's disease of right knee   . Skin cyst 10/03/2017   Removed from back  . Type 2 diabetes mellitus ([place])     Past Surgical History: Past Surgical History:  Procedure Laterality Date  . BIOPSY  01/21/2019   Procedure: BIOPSY;  Surgeon: [name], MD;  [facility];  Service: Endoscopy;;  gastric bx and gastric polyps  . BIOPSY  11/08/2020   Procedure: BIOPSY;  Surgeon: [name], MD;  [facility];  Service: Gastroenterology;;  . CARDIAC CATHETERIZATION  2011  . CARDIAC CATHETERIZATION N/A 09/20/2015   Procedure: Left Heart Cath and Coronary Angiography;  Surgeon: [name], MD;  Location: [place] [facility];  Service: Cardiovascular;  Laterality: N/A;  . CHOLECYSTECTOMY  2003  . COLONOSCOPY  06/19/2012   Procedure: COLONOSCOPY;  Surgeon: [name], MD;  [facility];  Service: Endoscopy;  Laterality: N/A;  930  . COLONOSCOPY N/A 10/17/2017   Procedure: COLONOSCOPY;  Surgeon: [name], MD;  [facility];  Service: Endoscopy;  Laterality: N/A;  1225  . COLONOSCOPY WITH PROPOFOL N/A 11/08/2020   Procedure: COLONOSCOPY WITH PROPOFOL;  Surgeon: [name], MD;  [facility];  Service: Gastroenterology;  Laterality: N/A;  10:45  . CYST EXCISION  07/2019  . ESOPHAGOGASTRODUODENOSCOPY (EGD) WITH PROPOFOL N/A 01/21/2019   Procedure: ESOPHAGOGASTRODUODENOSCOPY (EGD) WITH PROPOFOL;  Surgeon: [name], MD;  [facility];  Service: Endoscopy;  Laterality: N/A;  10:15am  .  KNEE ARTHROPLASTY Right 1999  . LEFT HEART CATH AND CORONARY ANGIOGRAPHY N/A 02/26/2018   Procedure: LEFT HEART CATH AND CORONARY ANGIOGRAPHY;  Surgeon: [name], MD;  Location: [place] [facility];  Service: Cardiovascular;  Laterality: N/A;  . LEFT HEART CATHETERIZATION WITH CORONARY ANGIOGRAM N/A 12/09/2013   Procedure: LEFT HEART CATHETERIZATION WITH CORONARY ANGIOGRAM;  Surgeon: [name], MD;  Location: [facility];  Service: Cardiovascular;  Laterality: N/A;  . Liver biopsy    . POLYPECTOMY  10/17/2017   Procedure: POLYPECTOMY;  Surgeon: [name], MD;  [facility];  Service: Endoscopy;;  ascending colon, splenic flexure,sigmoid x2  . POLYPECTOMY  11/08/2020   Procedure: POLYPECTOMY;  Surgeon: [name], MD;  [facility];  Service: Gastroenterology;;  . Joanna Puff ELBOW RELEASE/NIRSCHEL PROCEDURE Right 07/25/2017   Procedure: TENNIS ELBOW RELEASE and debriedment;  Surgeon: [name], MD;  Location: [facility];  Service: Orthopedics;  Laterality: Right;  . TONSILLECTOMY  1970's    Family History: Family History  Problem Relation Age of Onset  . Osteoporosis Mother   . Cancer Maternal Aunt   . CVA Maternal Grandmother   . CVA Maternal Grandfather   . Healthy Son     Social History: Social History   Tobacco Use  Smoking Status Former Smoker  . Packs/day: 0.50  . Years: 10.00  . Pack years: 5.00  . Types: Cigarettes  . Start date: 02/02/1973  . Quit date: 09/17/1983  . Years since quitting: 37.1  Smokeless Tobacco Former Systems developer  . Types: Chew  . Quit date: 09/17/1983   Social History   Substance and Sexual Activity  Alcohol Use No  . Alcohol/week: 0.0 standard drinks   Social History   Substance and Sexual Activity  Drug Use No    Allergies: Allergies  Allergen Reactions  . Metoprolol Rash    Medications: Current Outpatient Medications  Medication Sig Dispense Refill  . acetaminophen  (TYLENOL) 500 MG tablet Take 1,500 mg by mouth every 8 (eight) hours as needed for moderate pain.    Marland Kitchen albuterol (VENTOLIN HFA) 108 (90 Base) MCG/ACT inhaler INHALE 2 PUFFS INTO LUNGS EVERY 6 HOURS AS NEEDED FOR WHEEZING AND SHORTNESS OF BREATH. (Patient taking differently: Inhale 2 puffs into the lungs every 6 (six) hours as needed for shortness of breath or wheezing. INHALE 2 PUFFS INTO LUNGS EVERY 6 HOURS AS NEEDED FOR WHEEZING AND SHORTNESS OF BREATH.) 8.5 g 5  . ALPRAZolam (XANAX) 1 MG tablet Take 1 tablet (1 mg total) by mouth 3 (three) times daily as needed for anxiety. 90 tablet 2  . aspirin 81 MG EC tablet Take 1 tablet (81 mg total) by mouth daily. 30 tablet 12  . atorvastatin (LIPITOR) 40 MG tablet Take 1 tablet (40 mg total) by mouth daily. 90 tablet 1  . B-D UF III MINI PEN NEEDLES 31G X 5 MM MISC USE UP TO 4 TIMES DAILY WITH INSULIN AS DIRECTED. 150 each 5  . bisoprolol (ZEBETA) 5 MG tablet Take 1 tablet (  5 mg total) by mouth daily. 90 tablet 2  . cetirizine (ZYRTEC) 10 MG tablet Take 1 tablet (10 mg total) by mouth daily. (Patient taking differently: Take 10 mg by mouth daily as needed for allergies.) 30 tablet 0  . Continuous Blood Gluc Sensor (FREESTYLE LIBRE 14 DAY SENSOR) MISC Inject 1 each into the skin every 14 (fourteen) days. Use as directed. 2 each 2  . dapagliflozin propanediol (FARXIGA) 5 MG TABS tablet Take 5 mg by mouth daily.    Marland Kitchen dicyclomine (BENTYL) 10 MG capsule Take 1 capsule (10 mg total) by mouth 2 (two) times daily before a meal. 60 capsule 5  . Exenatide ER (BYDUREON BCISE) 2 MG/0.85ML AUIJ Inject 2 mg into the skin every Monday.    . fluticasone (FLONASE) 50 MCG/ACT nasal spray Place 2 sprays into both nostrils daily. (Patient taking differently: Place 2 sprays into both nostrils daily as needed for allergies.) 16 g 0  . fluticasone-salmeterol (ADVAIR HFA) 115-21 MCG/ACT inhaler Inhale 2 puffs into the lungs 2 (two) times daily. 1 Inhaler 5  . gabapentin  (NEURONTIN) 300 MG capsule TAKE ONE (1) CAPSULE BY MOUTH 4 TIMES DAILY (Patient taking differently: Take 300 mg by mouth 2 (two) times daily.) 120 capsule 5  . HUMALOG KWIKPEN 200 UNIT/ML KwikPen Inject 35-40 Units into the skin 3 (three) times daily.    . hyoscyamine (LEVSIN SL) 0.125 MG SL tablet Place 1 tablet (0.125 mg total) under the tongue every 4 (four) hours as needed. (Patient taking differently: Place 0.125 mg under the tongue every 4 (four) hours as needed for cramping.) 30 tablet 0  . ipratropium (ATROVENT) 0.06 % nasal spray Place 2 sprays into both nostrils in the morning and at bedtime.    . Lancets MISC 1 each by Does not apply route 4 (four) times daily. 150 each 5  . lidocaine (LIDODERM) 5 % Place 1 patch onto the skin daily. Place 1 patch to your are of most significant pain on your neck once per day as needed. Remove & Discard patch within 12 hours (Patient taking differently: Place 1 patch onto the skin daily as needed (pain). Place 1 patch to your are of most significant pain on your neck once per day as needed. Remove & Discard patch within 12 hours) 30 patch 0  . losartan (COZAAR) 25 MG tablet TAKE (1) TABLET BY MOUTH ONCE DAILY. (Patient taking differently: Take 25 mg by mouth daily.) 90 tablet 3  . metFORMIN (GLUCOPHAGE) 500 MG tablet TAKE 2 TABLETS BY MOUTH TWICE DAILY WITH A MEAL. (Patient taking differently: Take 1,000 mg by mouth 2 (two) times daily with a meal.) 120 tablet 0  . methocarbamol (ROBAXIN) 500 MG tablet Take 500 mg by mouth every 8 (eight) hours as needed for muscle spasms.    . nitroGLYCERIN (NITROSTAT) 0.4 MG SL tablet Place 1 tablet (0.4 mg total) under the tongue every 5 (five) minutes as needed. (Patient taking differently: Place 0.4 mg under the tongue every 5 (five) minutes as needed for chest pain.) 25 tablet 3  . ondansetron (ZOFRAN) 4 MG tablet Take 4 mg by mouth every 8 (eight) hours as needed for nausea or vomiting.    . pantoprazole (PROTONIX) 40 MG  tablet Take 1 tablet (40 mg total) by mouth 2 (two) times daily before a meal. TAKE (1) TABLET BY MOUTH ONCE DAILY. (Patient taking differently: Take 40 mg by mouth 2 (two) times daily before a meal.) 60 tablet 3  . ranolazine (  RANEXA) 1000 MG SR tablet Take 1 tablet (1,000 mg total) by mouth 2 (two) times daily. 180 tablet 1  . sertraline (ZOLOFT) 100 MG tablet Take 1.5 tablets (150 mg total) by mouth daily. 135 tablet 2  . TOUJEO MAX SOLOSTAR 300 UNIT/ML Solostar Pen INJECT 130 UNITS INTO THE SKIN AT BEDTIME. (Patient taking differently: Inject 60 Units into the skin at bedtime.) 12 mL 0  . traZODone (DESYREL) 50 MG tablet Take 1 tablet (50 mg total) by mouth at bedtime as needed. (Patient taking differently: Take 50 mg by mouth at bedtime as needed for sleep.) 30 tablet 2   No current facility-administered medications for this visit.    Review of Systems: GENERAL: negative for malaise, night sweats HEENT: No changes in hearing or vision, no nose bleeds or other nasal problems. NECK: Negative for lumps, goiter, pain and significant neck swelling RESPIRATORY: Negative for cough, wheezing CARDIOVASCULAR: Negative for chest pain, leg swelling, palpitations, orthopnea GI: SEE HPI MUSCULOSKELETAL: Negative for joint pain or swelling, back pain, and muscle pain. SKIN: Negative for lesions, rash PSYCH: Negative for sleep disturbance, mood disorder and recent psychosocial stressors. HEMATOLOGY Negative for prolonged bleeding, bruising easily, and swollen nodes. ENDOCRINE: Negative for cold or heat intolerance, polyuria, polydipsia and goiter. NEURO: negative for tremor, gait imbalance, syncope and seizures. The remainder of the review of systems is noncontributory.   Physical Exam: BP 120/76 (BP Location: Left Arm, Patient Position: Sitting, Cuff Size: Large)   Pulse 82   Temp (!) 97.5 F (36.4 C) (Oral)   Ht 6' 2"  (1.88 m)   Wt 267 lb (121.1 kg)   BMI 34.28 kg/m  GENERAL: The patient is  AO x3, in no acute distress. Obese. HEENT: Head is normocephalic and atraumatic. EOMI are intact. Mouth is well hydrated and without lesions. NECK: Supple. No masses LUNGS: Clear to auscultation. No presence of rhonchi/wheezing/rales. Adequate chest expansion HEART: RRR, normal s1 and s2. ABDOMEN: Soft, nontender, no guarding, no peritoneal signs, and nondistended. BS +. No masses. EXTREMITIES: Without any cyanosis, clubbing, rash, lesions or edema. NEUROLOGIC: AOx3, no focal motor deficit. SKIN: no jaundice, no rashes  Imaging/Labs: as above  I personally reviewed and interpreted the available labs, imaging and endoscopic files.  Impression and Plan: Davarius Ridener is a 60 y.o. male with has medical history COPD, CAD s/p stent, diabetes c/b neuropathy , HLD, NASH and IBS-D, who presents for follow up of diarrhea and abdominal pain.  The patient has presented improvement in symptoms with Imodium as needed and has not required to take any antispasmodics for the abdominal pain.  It seems that his IBS is pretty well controlled at this moment and does not warrant any further management but he will require take more natural antispasmodic such as IBgard, which will be used at this time.  On the other hand, the patient had elevated liver function tests which are likely related to NASH.  I explained to the patient the importance of implementing a Mediterranean diet to improve his weight -he can also implement low FODMAP regimen to this.  His NAFLD score is -0.22, with indeterminant score - will discuss performing an elastography during his next clinical encounter.  We will also repeat LFTs in 6 months.  Finally, he will need a repeat colonoscopy in 6 months for surveillance after piecemeal polypectomy of polyp more than 10 mm.  - Explained to the patient the etiology and consequences of his current liver disease. Patient was counseled about the benefit  of implementing a Mediterranean diet and  exercise plan. The patient understood about the importance of lifestyle changes to potentially reverse his liver involvement. - Explained presumed etiology of IBS symptoms. Patient was counseled about the benefit of implementing a low FODMAP but also a Mediterranean to improve symptoms and recurrent episodes and improve his liver enzymes. A dietary list was provided to the patient. Also, the patient was counseled about the benefit of avoiding stressing situations and potential environmental triggers leading to symptomatology. - Continue Imodium as needed - Start IBGard 1 tablet every 8-12 hours as needed for abdominal pain - Please proceed with repeat colonoscopy in 6 months - Will repeat LFTs and perform liver elastography in 6 months  All questions were answered.      [name], MD Gastroenterology and Hepatology [facility]

## 2020-11-17 NOTE — Patient Instructions (Addendum)
Explained presumed etiology of IBS symptoms. Patient was counseled about the benefit of implementing a low FODMAP but also a Mediterranean to improve symptoms and recurrent episodes and improve his liver enzymes. A dietary list was provided to the patient. Also, the patient was counseled about the benefit of avoiding stressing situations and potential environmental triggers leading to symptomatology. Continue Imodium as needed Start IBGard 1 tablet every 8-12 hours as needed for abdominal pain Please proceed with repeat colonoscopy in 6 months Will repeat LFTs in 6 months

## 2020-11-20 DIAGNOSIS — E1165 Type 2 diabetes mellitus with hyperglycemia: Secondary | ICD-10-CM | POA: Diagnosis not present

## 2020-12-01 ENCOUNTER — Telehealth (HOSPITAL_COMMUNITY): Payer: Self-pay | Admitting: Psychiatry

## 2020-12-01 DIAGNOSIS — M79672 Pain in left foot: Secondary | ICD-10-CM | POA: Diagnosis not present

## 2020-12-01 DIAGNOSIS — G5790 Unspecified mononeuropathy of unspecified lower limb: Secondary | ICD-10-CM | POA: Diagnosis not present

## 2020-12-01 DIAGNOSIS — M79671 Pain in right foot: Secondary | ICD-10-CM | POA: Diagnosis not present

## 2020-12-01 DIAGNOSIS — M792 Neuralgia and neuritis, unspecified: Secondary | ICD-10-CM | POA: Diagnosis not present

## 2020-12-01 NOTE — Telephone Encounter (Signed)
Called to schdule f/u appt left vm

## 2020-12-07 ENCOUNTER — Ambulatory Visit (INDEPENDENT_AMBULATORY_CARE_PROVIDER_SITE_OTHER): Payer: Medicare Other | Admitting: Family Medicine

## 2020-12-07 ENCOUNTER — Other Ambulatory Visit: Payer: Self-pay

## 2020-12-07 ENCOUNTER — Encounter: Payer: Self-pay | Admitting: Family Medicine

## 2020-12-07 VITALS — BP 118/72 | HR 90 | Temp 97.0°F | Wt 264.4 lb

## 2020-12-07 DIAGNOSIS — I951 Orthostatic hypotension: Secondary | ICD-10-CM

## 2020-12-07 NOTE — Progress Notes (Signed)
Patient ID: Jared Griffin, male    DOB: 1961-08-15, 60 y.o.   MRN: [id number]   Chief Complaint  Patient presents with  . Dizziness    For a long time- patient saw cardiologist recently and he changed his blood pressure med and his blood pressure been dropping and dizzy spells got worse- also having fatigue   Subjective:  CC: fatigue and weakness worse past month  This is not a new problem.  Presents today for an acute visit with a complaint of dizziness for years.  Reports that the dizziness has worsened over the last several months.  Also reporting fatigue.  Has a extensive cardiac history, saw cardiology October 07, 2020 when a beta-blocker was started at that time.  Reports that he is having some low blood pressure readings at home along with his fatigue and weakness.  Reports that he is feeling totally give out after doing work around the home.  November 2021 had a echocardiogram done EF was 60 to 65% at that time.  Denies fever and chills, endorses chest pain, has a history of angina in which he is seeing cardiology and on medication for.    Medical History Jared Griffin has a past medical history of Anxiety, COPD (chronic obstructive pulmonary disease) ([place]), Coronary atherosclerosis of native coronary artery, Depression, Diabetic neuropathy ([place]), Elbow injury (07/25/2017), Essential hypertension, Fatty liver disease, nonalcoholic, Lateral epicondylitis of right elbow, Mixed hyperlipidemia, Obesity, Osgood-Schlatter's disease of right knee, Skin cyst (10/03/2017), and Type 2 diabetes mellitus ([place]).   Outpatient Encounter Medications as of 12/07/2020  Medication Sig  . acetaminophen (TYLENOL) 500 MG tablet Take 1,500 mg by mouth every 8 (eight) hours as needed for moderate pain.  Marland Kitchen albuterol (VENTOLIN HFA) 108 (90 Base) MCG/ACT inhaler INHALE 2 PUFFS INTO LUNGS EVERY 6 HOURS AS NEEDED FOR WHEEZING AND SHORTNESS OF BREATH. (Patient taking differently: Inhale 2 puffs into the lungs every 6  (six) hours as needed for shortness of breath or wheezing. INHALE 2 PUFFS INTO LUNGS EVERY 6 HOURS AS NEEDED FOR WHEEZING AND SHORTNESS OF BREATH.)  . ALPRAZolam (XANAX) 1 MG tablet Take 1 tablet (1 mg total) by mouth 3 (three) times daily as needed for anxiety.  Marland Kitchen aspirin 81 MG EC tablet Take 1 tablet (81 mg total) by mouth daily.  Marland Kitchen atorvastatin (LIPITOR) 40 MG tablet Take 1 tablet (40 mg total) by mouth daily.  . B-D UF III MINI PEN NEEDLES 31G X 5 MM MISC USE UP TO 4 TIMES DAILY WITH INSULIN AS DIRECTED.  Marland Kitchen bisoprolol (ZEBETA) 5 MG tablet Take 1 tablet (5 mg total) by mouth daily.  . cetirizine (ZYRTEC) 10 MG tablet Take 1 tablet (10 mg total) by mouth daily. (Patient taking differently: Take 10 mg by mouth daily as needed for allergies.)  . Continuous Blood Gluc Sensor (FREESTYLE LIBRE 14 DAY SENSOR) MISC Inject 1 each into the skin every 14 (fourteen) days. Use as directed.  . dapagliflozin propanediol (FARXIGA) 5 MG TABS tablet Take 5 mg by mouth daily.  Marland Kitchen dicyclomine (BENTYL) 10 MG capsule Take 1 capsule (10 mg total) by mouth 2 (two) times daily before a meal.  . Exenatide ER (BYDUREON BCISE) 2 MG/0.85ML AUIJ Inject 2 mg into the skin every Monday.  . fluticasone (FLONASE) 50 MCG/ACT nasal spray Place 2 sprays into both nostrils daily. (Patient taking differently: Place 2 sprays into both nostrils daily as needed for allergies.)  . fluticasone-salmeterol (ADVAIR HFA) 115-21 MCG/ACT inhaler Inhale 2 puffs into the  lungs 2 (two) times daily.  Marland Kitchen gabapentin (NEURONTIN) 300 MG capsule TAKE ONE (1) CAPSULE BY MOUTH 4 TIMES DAILY (Patient taking differently: Take 300 mg by mouth 2 (two) times daily.)  . HUMALOG KWIKPEN 200 UNIT/ML KwikPen Inject 35-40 Units into the skin 3 (three) times daily.  . hyoscyamine (LEVSIN SL) 0.125 MG SL tablet Place 1 tablet (0.125 mg total) under the tongue every 4 (four) hours as needed. (Patient taking differently: Place 0.125 mg under the tongue every 4 (four) hours  as needed for cramping.)  . ipratropium (ATROVENT) 0.06 % nasal spray Place 2 sprays into both nostrils in the morning and at bedtime.  . Lancets MISC 1 each by Does not apply route 4 (four) times daily.  Marland Kitchen lidocaine (LIDODERM) 5 % Place 1 patch onto the skin daily. Place 1 patch to your are of most significant pain on your neck once per day as needed. Remove & Discard patch within 12 hours (Patient taking differently: Place 1 patch onto the skin daily as needed (pain). Place 1 patch to your are of most significant pain on your neck once per day as needed. Remove & Discard patch within 12 hours)  . losartan (COZAAR) 25 MG tablet TAKE (1) TABLET BY MOUTH ONCE DAILY. (Patient taking differently: Take 25 mg by mouth daily.)  . metFORMIN (GLUCOPHAGE) 500 MG tablet TAKE 2 TABLETS BY MOUTH TWICE DAILY WITH A MEAL. (Patient taking differently: Take 1,000 mg by mouth 2 (two) times daily with a meal.)  . methocarbamol (ROBAXIN) 500 MG tablet Take 500 mg by mouth every 8 (eight) hours as needed for muscle spasms.  . nitroGLYCERIN (NITROSTAT) 0.4 MG SL tablet Place 1 tablet (0.4 mg total) under the tongue every 5 (five) minutes as needed. (Patient taking differently: Place 0.4 mg under the tongue every 5 (five) minutes as needed for chest pain.)  . ondansetron (ZOFRAN) 4 MG tablet Take 4 mg by mouth every 8 (eight) hours as needed for nausea or vomiting.  . pantoprazole (PROTONIX) 40 MG tablet Take 1 tablet (40 mg total) by mouth 2 (two) times daily before a meal. TAKE (1) TABLET BY MOUTH ONCE DAILY. (Patient taking differently: Take 40 mg by mouth 2 (two) times daily before a meal.)  . ranolazine (RANEXA) 1000 MG SR tablet Take 1 tablet (1,000 mg total) by mouth 2 (two) times daily.  . sertraline (ZOLOFT) 100 MG tablet Take 1.5 tablets (150 mg total) by mouth daily.  . TOUJEO MAX SOLOSTAR 300 UNIT/ML Solostar Pen INJECT 130 UNITS INTO THE SKIN AT BEDTIME. (Patient taking differently: Inject 60 Units into the skin  at bedtime.)  . traZODone (DESYREL) 50 MG tablet Take 1 tablet (50 mg total) by mouth at bedtime as needed. (Patient taking differently: Take 50 mg by mouth at bedtime as needed for sleep.)   No facility-administered encounter medications on file as of 12/07/2020.     Review of Systems  Constitutional: Positive for fatigue. Negative for chills and fever.  Respiratory: Positive for shortness of breath.   Cardiovascular: Positive for chest pain. Negative for palpitations and leg swelling.       Angina history.   Neurological: Positive for dizziness, weakness and headaches.       Sees neuro for headaches (Dr. Vertell Limber).  Feels like body weighs 3 times and feels like going to pass out.      Vitals BP 118/72   Pulse 90   Temp (!) 97 F (36.1 C) (Oral)   Wt 264  lb 6.4 oz (119.9 kg)   SpO2 100%   BMI 33.95 kg/m   Objective:   Physical Exam Vitals reviewed.  Cardiovascular:     Rate and Rhythm: Normal rate and regular rhythm.     Heart sounds: Normal heart sounds.  Pulmonary:     Effort: Pulmonary effort is normal.     Breath sounds: Normal breath sounds.  Skin:    General: Skin is warm and dry.  Neurological:     General: No focal deficit present.     Mental Status: He is alert.  Psychiatric:        Behavior: Behavior normal.      Assessment and Plan   1. Orthostatic hypotension   Lying: 128/76 Sitting 120/74 Standing 98/70  Started beta blocker in January per cardiology. Symptoms- dizziness worse past several months.  Consulted with Dr. [name] while patient in the office today.  Dr. Wolfgang Phoenix recommended decreasing beta-blocker dosage, monitoring blood pressure, and following up with him on Monday.  Bisoprolol 2.5 mg daily starting today.  Keep close eye on blood pressure, especially when having feelings of dizziness, and keep a blood pressure log.  Bring this log to your visit with Dr. [name] on Monday.   Agrees with plan of care discussed  today. Understands warning signs to seek further care: chest pain, shortness of breath, any significant change in health.  Understands to follow-up Monday with Dr. Theressa Millard, NP 12/07/20

## 2020-12-07 NOTE — Patient Instructions (Signed)
Start taking only half of bisoprolol today (2.5 mg).  Pay attention to your symptoms. Monitor blood pressure when you are feeling the dizziness. Keep appointment with Dr. Nicki Reaper on Monday and bring BP log to that visit.

## 2020-12-12 ENCOUNTER — Other Ambulatory Visit: Payer: Self-pay

## 2020-12-12 ENCOUNTER — Ambulatory Visit (INDEPENDENT_AMBULATORY_CARE_PROVIDER_SITE_OTHER): Payer: Medicare Other | Admitting: Family Medicine

## 2020-12-12 ENCOUNTER — Encounter: Payer: Self-pay | Admitting: Family Medicine

## 2020-12-12 VITALS — BP 118/72 | HR 71 | Temp 98.1°F | Ht 74.0 in | Wt 267.0 lb

## 2020-12-12 DIAGNOSIS — I6523 Occlusion and stenosis of bilateral carotid arteries: Secondary | ICD-10-CM | POA: Diagnosis not present

## 2020-12-12 DIAGNOSIS — I10 Essential (primary) hypertension: Secondary | ICD-10-CM | POA: Diagnosis not present

## 2020-12-12 NOTE — Progress Notes (Signed)
   Subjective:    Patient ID: Jared Griffin, male    DOB: 09/18/1960, 60 y.o.   MRN: [id number]  Hypertension This is a chronic problem. The problem is controlled. Pertinent negatives include no chest pain, headaches or shortness of breath. Risk factors for coronary artery disease include diabetes mellitus. Treatments tried: losartan, bisoprolol.   Patient's dizziness is actually doing better now that he has had the beta-blocker cut in half.  Denies any chest pain or shortness of breath.  Patient does have underlying health issues He is trying to do the best he can at keeping diabetes under control and also his cholesterol and blood pressure Patient has history of carotid artery disease does need to follow-up  Review of Systems  Constitutional: Negative for activity change, fatigue and fever.  HENT: Negative for congestion and rhinorrhea.   Respiratory: Negative for cough and shortness of breath.   Cardiovascular: Negative for chest pain and leg swelling.  Gastrointestinal: Negative for abdominal pain, diarrhea and nausea.  Genitourinary: Negative for dysuria and hematuria.  Neurological: Negative for weakness and headaches.  Psychiatric/Behavioral: Negative for agitation and behavioral problems.       Objective:   Physical Exam Vitals reviewed.  Constitutional:      General: He is not in acute distress. HENT:     Head: Normocephalic and atraumatic.  Eyes:     General:        Right eye: No discharge.        Left eye: No discharge.  Neck:     Trachea: No tracheal deviation.  Cardiovascular:     Rate and Rhythm: Normal rate and regular rhythm.     Heart sounds: Normal heart sounds. No murmur heard.   Pulmonary:     Effort: Pulmonary effort is normal. No respiratory distress.     Breath sounds: Normal breath sounds.  Lymphadenopathy:     Cervical: No cervical adenopathy.  Skin:    General: Skin is warm and dry.  Neurological:     Mental Status: He is alert.      Coordination: Coordination normal.  Psychiatric:        Behavior: Behavior normal.     The patient was told the best way to keep carotid artery disease under good control is to work hard at keeping cholesterol sugar blood pressure under good control and staying physically active and keeping weight down  Patient will do follow-up in approximately 6 months      Assessment & Plan:  1. Essential hypertension, benign Orthostatic exam reveals blood pressure stable laying sitting standing no severe drop   2. Bilateral carotid artery stenosis Had carotid ultrasound done several years ago he is due for another 1 to make sure this is not progressing into dangerous direction. - US Carotid Duplex Bilateral

## 2020-12-14 ENCOUNTER — Telehealth: Payer: Self-pay | Admitting: Cardiology

## 2020-12-14 NOTE — Telephone Encounter (Signed)
Ref [id number]   Calling about authorization for Vascepa Capsules

## 2020-12-14 NOTE — Telephone Encounter (Signed)
Spoke with cover my meds and made them aware that Vascepa is not on pt med list or profile - cover my meds will close out request they could not determine where the request came from initially

## 2020-12-19 DIAGNOSIS — M4802 Spinal stenosis, cervical region: Secondary | ICD-10-CM | POA: Diagnosis not present

## 2020-12-19 DIAGNOSIS — M5412 Radiculopathy, cervical region: Secondary | ICD-10-CM | POA: Diagnosis not present

## 2020-12-19 DIAGNOSIS — M47812 Spondylosis without myelopathy or radiculopathy, cervical region: Secondary | ICD-10-CM | POA: Diagnosis not present

## 2020-12-19 DIAGNOSIS — I10 Essential (primary) hypertension: Secondary | ICD-10-CM | POA: Diagnosis not present

## 2020-12-19 DIAGNOSIS — M5030 Other cervical disc degeneration, unspecified cervical region: Secondary | ICD-10-CM | POA: Diagnosis not present

## 2020-12-19 DIAGNOSIS — M5020 Other cervical disc displacement, unspecified cervical region: Secondary | ICD-10-CM | POA: Diagnosis not present

## 2020-12-19 DIAGNOSIS — M542 Cervicalgia: Secondary | ICD-10-CM | POA: Diagnosis not present

## 2020-12-21 DIAGNOSIS — E1165 Type 2 diabetes mellitus with hyperglycemia: Secondary | ICD-10-CM | POA: Diagnosis not present

## 2021-01-10 ENCOUNTER — Ambulatory Visit (HOSPITAL_COMMUNITY): Payer: Medicare Other | Admitting: Physical Therapy

## 2021-01-17 DIAGNOSIS — M79672 Pain in left foot: Secondary | ICD-10-CM | POA: Diagnosis not present

## 2021-01-17 DIAGNOSIS — G5790 Unspecified mononeuropathy of unspecified lower limb: Secondary | ICD-10-CM | POA: Diagnosis not present

## 2021-01-17 DIAGNOSIS — M79671 Pain in right foot: Secondary | ICD-10-CM | POA: Diagnosis not present

## 2021-01-17 DIAGNOSIS — M792 Neuralgia and neuritis, unspecified: Secondary | ICD-10-CM | POA: Diagnosis not present

## 2021-01-19 ENCOUNTER — Ambulatory Visit (HOSPITAL_COMMUNITY): Payer: Medicare Other | Attending: Neurosurgery | Admitting: Physical Therapy

## 2021-01-19 ENCOUNTER — Other Ambulatory Visit: Payer: Self-pay

## 2021-01-19 ENCOUNTER — Encounter (HOSPITAL_COMMUNITY): Payer: Self-pay | Admitting: Physical Therapy

## 2021-01-19 DIAGNOSIS — M6281 Muscle weakness (generalized): Secondary | ICD-10-CM | POA: Insufficient documentation

## 2021-01-19 DIAGNOSIS — M542 Cervicalgia: Secondary | ICD-10-CM | POA: Diagnosis not present

## 2021-01-19 DIAGNOSIS — R29898 Other symptoms and signs involving the musculoskeletal system: Secondary | ICD-10-CM | POA: Insufficient documentation

## 2021-01-19 NOTE — Patient Instructions (Signed)
Access Code: [email or web address] URL: [email or web address] Date: 01/19/2021 Prepared by: Mitzi Hansen Tionne Carelli  Exercises Supine Chin Tuck - 3 x daily - 7 x weekly - 2 sets - 10 reps

## 2021-01-19 NOTE — Therapy (Signed)
[place], [place], [address] Phone: [phone]   Fax:  [phone]  Physical Therapy Evaluation  Patient Details  Name: Jared Griffin MRN: [id number] Date of Birth: Oct 03, 1960 Referring Provider (PT): Erline Levine MD   Encounter Date: 01/19/2021   PT End of Session - 01/19/21 1315    Visit Number 1    Number of Visits 8    Date for PT Re-Evaluation 02/16/21    Authorization Type UHC Medicare (no auth, no VL)    PT Start Time 1005    PT Stop Time 1042    PT Time Calculation (min) 37 min    Activity Tolerance Patient tolerated treatment well    Behavior During Therapy [facility] for tasks assessed/performed           Past Medical History:  Diagnosis Date  . Anxiety   . COPD (chronic obstructive pulmonary disease) ([place])   . Coronary atherosclerosis of native coronary artery    a. 12/09/2013: s/p PCI with 3.0 x 28 Promus DES to mLAD  . Depression   . Diabetic neuropathy ([place])   . Elbow injury 07/25/2017   Surgery for torn tendon  . Essential hypertension   . Fatty liver disease, nonalcoholic   . Lateral epicondylitis of right elbow   . Mixed hyperlipidemia   . Obesity   . Osgood-Schlatter's disease of right knee   . Skin cyst 10/03/2017   Removed from back  . Type 2 diabetes mellitus ([place])     Past Surgical History:  Procedure Laterality Date  . BIOPSY  01/21/2019   Procedure: BIOPSY;  Surgeon: [name], MD;  [facility];  Service: Endoscopy;;  gastric bx and gastric polyps  . BIOPSY  11/08/2020   Procedure: BIOPSY;  Surgeon: [name], MD;  [facility];  Service: Gastroenterology;;  . CARDIAC CATHETERIZATION  2011  . CARDIAC CATHETERIZATION N/A 09/20/2015   Procedure: Left Heart Cath and Coronary Angiography;  Surgeon: [name], MD;  Location: [place] [facility];  Service: Cardiovascular;  Laterality: N/A;  . CHOLECYSTECTOMY  2003  .  COLONOSCOPY  06/19/2012   Procedure: COLONOSCOPY;  Surgeon: [name], MD;  [facility];  Service: Endoscopy;  Laterality: N/A;  930  . COLONOSCOPY N/A 10/17/2017   Procedure: COLONOSCOPY;  Surgeon: [name], MD;  [facility];  Service: Endoscopy;  Laterality: N/A;  1225  . COLONOSCOPY WITH PROPOFOL N/A 11/08/2020   Procedure: COLONOSCOPY WITH PROPOFOL;  Surgeon: [name], MD;  [facility];  Service: Gastroenterology;  Laterality: N/A;  10:45  . CYST EXCISION  07/2019  . ESOPHAGOGASTRODUODENOSCOPY (EGD) WITH PROPOFOL N/A 01/21/2019   Procedure: ESOPHAGOGASTRODUODENOSCOPY (EGD) WITH PROPOFOL;  Surgeon: [name], MD;  [facility];  Service: Endoscopy;  Laterality: N/A;  10:15am  . KNEE ARTHROPLASTY Right 1999  . LEFT HEART CATH AND CORONARY ANGIOGRAPHY N/A 02/26/2018   Procedure: LEFT HEART CATH AND CORONARY ANGIOGRAPHY;  Surgeon: [name], MD;  Location: [place] [facility];  Service: Cardiovascular;  Laterality: N/A;  . LEFT HEART CATHETERIZATION WITH CORONARY ANGIOGRAM N/A 12/09/2013   Procedure: LEFT HEART CATHETERIZATION WITH CORONARY ANGIOGRAM;  Surgeon: [name], MD;  Location: [facility] C[facility];  Service: Cardiovascular;  Laterality: N/A;  . Liver biopsy    . POLYPECTOMY  10/17/2017   Procedure: POLYPECTOMY;  Surgeon: [name], MD;  [facility];  Service: Endoscopy;;  ascending colon, splenic flexure,sigmoid x2  . POLYPECTOMY  11/08/2020   Procedure: POLYPECTOMY;  Surgeon: [name], MD;  [facility];  Service: Gastroenterology;;  . Joanna Puff ELBOW RELEASE/NIRSCHEL PROCEDURE Right 07/25/2017   Procedure: TENNIS ELBOW RELEASE and debriedment;  Surgeon: [name], MD;  Location: [facility];  Service: Orthopedics;  Laterality: Right;  . TONSILLECTOMY  1970's    There were no vitals filed for this visit.    Subjective Assessment - 01/19/21 1006     Subjective Patient is a 60 y.o. male who presents to physical therapy with c/o neck pain and headaches. Patient states symptoms began a few years ago about 1-2 years. He states it may go back further to when he got injured in [employer] training. He states symptoms have been getting worse. He has a disc protruding in his neck but nothing bad enough for surgery. He had some dry needling earlier this year which did not help. Symptoms worse with sitting and looking up while at church, turning neck, sleeping. He has not found anything that makes it better. His neck is very stiff and he tried some stretches which helped some. Patient states his main goal is to decrease pain and feel better.    Limitations House hold activities;Sitting;Reading;Lifting    Patient Stated Goals decrease pain and feel better    Currently in Pain? Yes    Pain Score 1     Pain Location Neck    Pain Descriptors / Indicators Dull    Pain Type Chronic pain    Pain Onset More than a month ago    Pain Frequency Constant    Aggravating Factors  movement    Pain Relieving Factors nothing              [facility] PT Assessment - 01/19/21 0001      Assessment   Medical Diagnosis Cervicalgia    Referring Provider (PT) Erline Levine MD    Onset Date/Surgical Date 01/20/19   approx.   Next MD Visit June 8th      Precautions   Precautions None      Restrictions   Weight Bearing Restrictions No      Balance Screen   Has the patient fallen in the past 6 months No    Has the patient had a decrease in activity level because of a fear of falling?  No    Is the patient reluctant to leave their home because of a fear of falling?  No      Prior Function   Level of Independence Independent    Vocation Retired    Building surveyor      Cognition   Overall Cognitive Status Within Functional Limits for tasks assessed      Observation/Other Assessments   Observations Ambulates without AD    Focus on Therapeutic  Outcomes (FOTO)  49% function      Sensation   Light Touch Appears Intact      Posture/Postural Control   Posture/Postural Control Postural limitations    Postural Limitations Rounded Shoulders;Forward head      ROM / Strength   AROM / PROM / Strength AROM;Strength      AROM   AROM Assessment Site Cervical    Cervical Flexion 25% limited, pain in lower neck    Cervical Extension 50% limited, pain in mid cervical spine    Cervical - Right Side Bend 25% limited, pain and tightness  Cervical - Left Side Bend 50% limited, pain and tightness    Cervical - Right Rotation 50% limited, pain and tightness    Cervical - Left Rotation 25% limited, pain and tightness      Strength   Overall Strength Comments hx elbow surgery R    Strength Assessment Site Shoulder;Elbow;Wrist;Hand    Right/Left Shoulder Right;Left    Right Shoulder ABduction 5/5    Left Shoulder ABduction 5/5    Right/Left Elbow Right;Left    Right Elbow Flexion 4+/5    Right Elbow Extension 4+/5    Left Elbow Flexion 5/5    Left Elbow Extension 5/5    Right/Left Wrist Right;Left    Right Wrist Flexion 5/5    Right Wrist Extension 5/5    Left Wrist Flexion 5/5    Left Wrist Extension 5/5    Right/Left hand Right;Left    Right Hand Gross Grasp Functional    Left Hand Gross Grasp Functional      Palpation   Spinal mobility grossly hypomobile cervical spine and tender    SI assessment  TTP bilateral suboccipitals and paraspinals L>R      Special Tests   Other special tests Cervical manual traction: felt good"                      Objective measurements completed on examination: See above findings.       [place] Adult PT Treatment/Exercise - 01/19/21 0001      Exercises   Exercises Neck      Neck Exercises: Supine   Neck Retraction 10 reps;3 secs    Neck Retraction Limitations 2 sets                  PT Education - 01/19/21 1005    Education Details Patient educated on exam  findings, POC, scope of PT, HEP    Person(s) Educated Patient    Methods Explanation;Demonstration;Handout    Comprehension Verbalized understanding;Returned demonstration            PT Short Term Goals - 01/19/21 1331      PT SHORT TERM GOAL #1   Title Patient will be independent with HEP in order to improve functional outcomes.    Time 2    Period Weeks    Status New    Target Date 02/02/21      PT SHORT TERM GOAL #2   Title Patient will report at least 25% improvement in symptoms for improved quality of life.    Time 2    Period Weeks    Status New    Target Date 02/02/21             PT Long Term Goals - 01/19/21 1332      PT LONG TERM GOAL #1   Title Patient will report at least 75% improvement in symptoms for improved quality of life.    Time 4    Period Weeks    Status New    Target Date 02/16/21      PT LONG TERM GOAL #2   Title Patient will improve FOTO score by at least 10 points in order to indicate improved tolerance to activity.    Time 4    Period Weeks    Status New    Target Date 02/16/21      PT LONG TERM GOAL #3   Title Patient will demonstrate at least 25% improvement in cervical ROM in all planes for  improved ability to move head while driving.    Time 4    Period Weeks    Status New    Target Date 02/16/21                  Plan - 01/19/21 1318    Clinical Impression Statement Patient is a 60 y.o. male who presents to physical therapy with c/o neck pain and headaches. He presents with pain limited deficits in cervical spine strength, ROM, endurance, hyperactive and tender musculature, postural impairments, spinal mobility and functional mobility with ADL. He is having to modify and restrict ADL as indicated by FOTO score as well as subjective information and objective measures which is affecting overall participation. Patient will benefit from skilled physical therapy in order to improve function and reduce impairment.    Personal  Factors and Comorbidities Behavior Pattern;Social Background;Comorbidity 3+;Fitness;Time since onset of injury/illness/exacerbation    Comorbidities Angina, Anxiety, Arthritis, Back pain, BMI over 30, Depression,   Diabetes,  Headaches, High Blood Pressure    Examination-Activity Limitations Sleep;Bend;Lift;Reach Overhead;Carry    Examination-Participation Restrictions Cleaning;Community Activity;Church;Shop;Other   driving   Stability/Clinical Decision Making Stable/Uncomplicated    Clinical Decision Making Low    Rehab Potential Good    PT Frequency 2x / week    PT Duration 4 weeks    PT Treatment/Interventions ADLs/Self Care Home Management;Aquatic Therapy;Cryotherapy;Electrical Stimulation;Moist Heat;Traction;Ultrasound;Parrafin;Fluidtherapy;Contrast Bath;DME Instruction;Gait training;Stair training;Functional mobility training;Therapeutic exercise;Balance training;Therapeutic activities;Neuromuscular re-education;Patient/family education;Orthotic Fit/Training;Manual techniques;Compression bandaging;Scar mobilization;Passive range of motion;Dry needling;Energy conservation;Splinting;Spinal Manipulations;Joint Manipulations;Taping    PT Next Visit Plan possibly began manual for pain/mobility, f/u with cervical retractions, possibly begin postural strengthening, cervical mobility and strength, thoracic mobility    PT Home Exercise Plan 5/5 supine cervical retractions    Consulted and Agree with Plan of Care Patient           Patient will benefit from skilled therapeutic intervention in order to improve the following deficits and impairments:  Hypomobility,Decreased activity tolerance,Decreased strength,Pain,Decreased mobility,Increased muscle spasms,Decreased range of motion,Improper body mechanics,Postural dysfunction,Impaired flexibility  Visit Diagnosis: Cervicalgia  Muscle weakness (generalized)  Other symptoms and signs involving the musculoskeletal system     Problem  List Patient Active Problem List   Diagnosis Date Noted  . Orthostatic hypotension 12/07/2020  . IBS (irritable bowel syndrome) 11/17/2020  . Chronic diarrhea 08/17/2020  . NASH (nonalcoholic steatohepatitis) 08/17/2020  . Elevated LFTs 08/17/2020  . Tremor of both hands 05/06/2020  . Bilateral occipital neuralgia 05/06/2020  . Abdominal pain, chronic, epigastric 01/13/2019  . Anxiety 01/04/2019  . Radicular pain of left lower extremity 12/22/2018  . Moderate persistent asthma 10/25/2018  . Dizziness 07/12/2018  . Nonintractable headache   . Abnormal nuclear stress test   . Aftercare following surgery 07/25/17 08/13/2017  . Hx of colonic polyps 08/07/2017  . Lateral epicondylitis of right elbow   . Cervical nerve root impingement 12/09/2015  . Generalized anxiety disorder 09/27/2015  . Chest pain 09/20/2015  . Pain in the chest   . Depression 01/24/2015  . Esophageal reflux 01/24/2015  . Preoperative cardiovascular examination 06/28/2014  . DM type 2 causing vascular disease ([place])   . Mixed hyperlipidemia   . Obesity   . Intermediate coronary syndrome ([place]) 12/09/2013  . Lumbago 05/21/2012  . Essential hypertension, benign 12/28/2009  . CAD S/P LAD DES March 2015 12/28/2009  . Hyperlipidemia LDL goal <70 11/25/2009  . Accelerating angina ([place]) 11/25/2009  . DM 11/24/2009  . ABDOMINAL PAIN, HX OF 11/24/2009    1:35  PM, 01/19/21 Mearl Latin PT, DPT Physical Therapist at [place], [place], [address] Phone: [phone]   Fax:  [phone]  Name: Jared Griffin MRN: [id number] Date of Birth: 09-Apr-1961

## 2021-01-20 DIAGNOSIS — E1165 Type 2 diabetes mellitus with hyperglycemia: Secondary | ICD-10-CM | POA: Diagnosis not present

## 2021-01-24 ENCOUNTER — Ambulatory Visit (HOSPITAL_COMMUNITY): Payer: Medicare Other

## 2021-01-24 ENCOUNTER — Telehealth (HOSPITAL_COMMUNITY): Payer: Self-pay

## 2021-01-24 ENCOUNTER — Other Ambulatory Visit (INDEPENDENT_AMBULATORY_CARE_PROVIDER_SITE_OTHER): Payer: Self-pay | Admitting: Internal Medicine

## 2021-01-24 NOTE — Telephone Encounter (Signed)
Pt called in he is sick and will not be here today

## 2021-01-26 ENCOUNTER — Ambulatory Visit (HOSPITAL_COMMUNITY): Payer: Medicare Other | Admitting: Physical Therapy

## 2021-01-26 ENCOUNTER — Telehealth (INDEPENDENT_AMBULATORY_CARE_PROVIDER_SITE_OTHER): Payer: Medicare Other | Admitting: Psychiatry

## 2021-01-26 ENCOUNTER — Encounter (HOSPITAL_COMMUNITY): Payer: Self-pay | Admitting: Psychiatry

## 2021-01-26 ENCOUNTER — Other Ambulatory Visit: Payer: Self-pay

## 2021-01-26 DIAGNOSIS — M6281 Muscle weakness (generalized): Secondary | ICD-10-CM

## 2021-01-26 DIAGNOSIS — M542 Cervicalgia: Secondary | ICD-10-CM | POA: Diagnosis not present

## 2021-01-26 DIAGNOSIS — F321 Major depressive disorder, single episode, moderate: Secondary | ICD-10-CM

## 2021-01-26 DIAGNOSIS — R29898 Other symptoms and signs involving the musculoskeletal system: Secondary | ICD-10-CM | POA: Diagnosis not present

## 2021-01-26 MED ORDER — SERTRALINE HCL 100 MG PO TABS
150.0000 mg | ORAL_TABLET | Freq: Every day | ORAL | 2 refills | Status: DC
Start: 2021-01-26 — End: 2021-04-28

## 2021-01-26 MED ORDER — ALPRAZOLAM 1 MG PO TABS
1.0000 mg | ORAL_TABLET | Freq: Three times a day (TID) | ORAL | 2 refills | Status: DC | PRN
Start: 2021-01-26 — End: 2021-04-28

## 2021-01-26 NOTE — Therapy (Signed)
[place], [place], [address] Phone: [phone]   Fax:  [phone]  Physical Therapy Treatment  Patient Details  Name: Jared Griffin MRN: [id number] Date of Birth: 1961/07/07 Referring Provider (PT): Erline Levine MD   Encounter Date: 01/26/2021   PT End of Session - 01/26/21 1525    Visit Number 2    Number of Visits 8    Date for PT Re-Evaluation 02/16/21    Authorization Type UHC Medicare (no auth, no VL)    PT Start Time 1450    PT Stop Time 1522    PT Time Calculation (min) 32 min    Activity Tolerance Patient tolerated treatment well    Behavior During Therapy [facility] for tasks assessed/performed           Past Medical History:  Diagnosis Date  . Anxiety   . COPD (chronic obstructive pulmonary disease) ([place])   . Coronary atherosclerosis of native coronary artery    a. 12/09/2013: s/p PCI with 3.0 x 28 Promus DES to mLAD  . Depression   . Diabetic neuropathy ([place])   . Elbow injury 07/25/2017   Surgery for torn tendon  . Essential hypertension   . Fatty liver disease, nonalcoholic   . Lateral epicondylitis of right elbow   . Mixed hyperlipidemia   . Obesity   . Osgood-Schlatter's disease of right knee   . Skin cyst 10/03/2017   Removed from back  . Type 2 diabetes mellitus ([place])     Past Surgical History:  Procedure Laterality Date  . BIOPSY  01/21/2019   Procedure: BIOPSY;  Surgeon: [name], MD;  [facility];  Service: Endoscopy;;  gastric bx and gastric polyps  . BIOPSY  11/08/2020   Procedure: BIOPSY;  Surgeon: [name], MD;  [facility];  Service: Gastroenterology;;  . CARDIAC CATHETERIZATION  2011  . CARDIAC CATHETERIZATION N/A 09/20/2015   Procedure: Left Heart Cath and Coronary Angiography;  Surgeon: [name], MD;  Location: [place] [facility];  Service: Cardiovascular;  Laterality: N/A;  . CHOLECYSTECTOMY  2003  .  COLONOSCOPY  06/19/2012   Procedure: COLONOSCOPY;  Surgeon: [name], MD;  [facility];  Service: Endoscopy;  Laterality: N/A;  930  . COLONOSCOPY N/A 10/17/2017   Procedure: COLONOSCOPY;  Surgeon: [name], MD;  [facility];  Service: Endoscopy;  Laterality: N/A;  1225  . COLONOSCOPY WITH PROPOFOL N/A 11/08/2020   Procedure: COLONOSCOPY WITH PROPOFOL;  Surgeon: [name], MD;  [facility];  Service: Gastroenterology;  Laterality: N/A;  10:45  . CYST EXCISION  07/2019  . ESOPHAGOGASTRODUODENOSCOPY (EGD) WITH PROPOFOL N/A 01/21/2019   Procedure: ESOPHAGOGASTRODUODENOSCOPY (EGD) WITH PROPOFOL;  Surgeon: [name], MD;  [facility];  Service: Endoscopy;  Laterality: N/A;  10:15am  . KNEE ARTHROPLASTY Right 1999  . LEFT HEART CATH AND CORONARY ANGIOGRAPHY N/A 02/26/2018   Procedure: LEFT HEART CATH AND CORONARY ANGIOGRAPHY;  Surgeon: [name], MD;  Location: [place] CV LAB;  Service: Cardiovascular;  Laterality: N/A;  . LEFT HEART CATHETERIZATION WITH CORONARY ANGIOGRAM N/A 12/09/2013   Procedure: LEFT HEART CATHETERIZATION WITH CORONARY ANGIOGRAM;  Surgeon: [name], MD;  Location: [facility] CATH LAB;  Service: Cardiovascular;  Laterality: N/A;  . Liver biopsy    . POLYPECTOMY  10/17/2017   Procedure: POLYPECTOMY;  Surgeon: [name], MD;  [facility];  Service: Endoscopy;;  ascending colon, splenic flexure,sigmoid x2  . POLYPECTOMY  11/08/2020   Procedure: POLYPECTOMY;  Surgeon: [name], MD;  [facility];  Service: Gastroenterology;;  . Joanna Puff ELBOW RELEASE/NIRSCHEL PROCEDURE Right 07/25/2017   Procedure: TENNIS ELBOW RELEASE and debriedment;  Surgeon: [name], MD;  Location: [facility];  Service: Orthopedics;  Laterality: Right;  . TONSILLECTOMY  1970's    There were no vitals filed for this visit.   Subjective Assessment - 01/26/21 1451     Subjective pt states his pain is not as bad today  Not enough to give a number.    Currently in Pain? No/denies                             [facility] Adult PT Treatment/Exercise - 01/26/21 0001      Neck Exercises: Seated   W Back 10 reps    Other Seated Exercise cervical excursions 5X each      Neck Exercises: Supine   Neck Retraction 10 reps;3 secs      Manual Therapy   Manual Therapy Soft tissue mobilization    Manual therapy comments completed seperately from all other skilled interventions    Soft tissue mobilization seated to bil scaps and traps                  PT Education - 01/26/21 1525    Education Details HEP and POC moving forward    Person(s) Educated Patient    Methods Explanation    Comprehension Verbalized understanding            PT Short Term Goals - 01/19/21 1331      PT SHORT TERM GOAL #1   Title Patient will be independent with HEP in order to improve functional outcomes.    Time 2    Period Weeks    Status New    Target Date 02/02/21      PT SHORT TERM GOAL #2   Title Patient will report at least 25% improvement in symptoms for improved quality of life.    Time 2    Period Weeks    Status New    Target Date 02/02/21             PT Long Term Goals - 01/19/21 1332      PT LONG TERM GOAL #1   Title Patient will report at least 75% improvement in symptoms for improved quality of life.    Time 4    Period Weeks    Status New    Target Date 02/16/21      PT LONG TERM GOAL #2   Title Patient will improve FOTO score by at least 10 points in order to indicate improved tolerance to activity.    Time 4    Period Weeks    Status New    Target Date 02/16/21      PT LONG TERM GOAL #3   Title Patient will demonstrate at least 25% improvement in cervical ROM in all planes for improved ability to move head while driving.    Time 4    Period Weeks    Status New    Target Date 02/16/21                 Plan -  01/26/21 1525    Clinical Impression Statement Reviewed goals, HEP and POC moving forward.  Added  seated cervical excursions with limited motion noted in all directions due to pain; more reduced for extension and flexion.  Seated retractions also added with good form obtained.  Began manual to bil traps and scaps in seated position. General tightness bilaterally but no spasms palpated.   Pt reported improved motion at end of session    Personal Factors and Comorbidities Behavior Pattern;Social Background;Comorbidity 3+;Fitness;Time since onset of injury/illness/exacerbation    Comorbidities Angina, Anxiety, Arthritis, Back pain, BMI over 30, Depression,   Diabetes,  Headaches, High Blood Pressure    Examination-Activity Limitations Sleep;Bend;Lift;Reach Overhead;Carry    Examination-Participation Restrictions Cleaning;Community Activity;Church;Shop;Other   driving   Stability/Clinical Decision Making Stable/Uncomplicated    Rehab Potential Good    PT Frequency 2x / week    PT Duration 4 weeks    PT Treatment/Interventions ADLs/Self Care Home Management;Aquatic Therapy;Cryotherapy;Electrical Stimulation;Moist Heat;Traction;Ultrasound;Parrafin;Fluidtherapy;Contrast Bath;DME Instruction;Gait training;Stair training;Functional mobility training;Therapeutic exercise;Balance training;Therapeutic activities;Neuromuscular re-education;Patient/family education;Orthotic Fit/Training;Manual techniques;Compression bandaging;Scar mobilization;Passive range of motion;Dry needling;Energy conservation;Splinting;Spinal Manipulations;Joint Manipulations;Taping    PT Next Visit Plan Continue manual for pain/mobility and progress strengthening, cervical mobility and strength, thoracic mobility    PT Home Exercise Plan 5/5 supine cervical retractions    Consulted and Agree with Plan of Care Patient           Patient will benefit from skilled therapeutic intervention in order to improve the following deficits and  impairments:  Hypomobility,Decreased activity tolerance,Decreased strength,Pain,Decreased mobility,Increased muscle spasms,Decreased range of motion,Improper body mechanics,Postural dysfunction,Impaired flexibility  Visit Diagnosis: Muscle weakness (generalized)  Cervicalgia     Problem List Patient Active Problem List   Diagnosis Date Noted  . Orthostatic hypotension 12/07/2020  . IBS (irritable bowel syndrome) 11/17/2020  . Chronic diarrhea 08/17/2020  . NASH (nonalcoholic steatohepatitis) 08/17/2020  . Elevated LFTs 08/17/2020  . Tremor of both hands 05/06/2020  . Bilateral occipital neuralgia 05/06/2020  . Abdominal pain, chronic, epigastric 01/13/2019  . Anxiety 01/04/2019  . Radicular pain of left lower extremity 12/22/2018  . Moderate persistent asthma 10/25/2018  . Dizziness 07/12/2018  . Nonintractable headache   . Abnormal nuclear stress test   . Aftercare following surgery 07/25/17 08/13/2017  . Hx of colonic polyps 08/07/2017  . Lateral epicondylitis of right elbow   . Cervical nerve root impingement 12/09/2015  . Generalized anxiety disorder 09/27/2015  . Chest pain 09/20/2015  . Pain in the chest   . Depression 01/24/2015  . Esophageal reflux 01/24/2015  . Preoperative cardiovascular examination 06/28/2014  . DM type 2 causing vascular disease ([place])   . Mixed hyperlipidemia   . Obesity   . Intermediate coronary syndrome ([place]) 12/09/2013  . Lumbago 05/21/2012  . Essential hypertension, benign 12/28/2009  . CAD S/P LAD DES March 2015 12/28/2009  . Hyperlipidemia LDL goal <70 11/25/2009  . Accelerating angina ([place]) 11/25/2009  . DM 11/24/2009  . ABDOMINAL PAIN, HX OF 11/24/2009   Teena Irani, PTA/CLT [id number]  Teena Irani 01/26/2021, 3:27 PM  [place] [address] [place], [place], [address] Phone: [phone]   Fax:  [phone]  Name: Jared Griffin MRN: [id number] Date of Birth:  1960-10-04

## 2021-01-26 NOTE — Progress Notes (Signed)
Virtual Visit via Telephone Note  I connected with Jared Griffin on 01/26/21 at  9:40 AM EDT by telephone and verified that I am speaking with the correct person using two identifiers.  Location: Patient: home Provider: office   I discussed the limitations, risks, security and privacy concerns of performing an evaluation and management service by telephone and the availability of in person appointments. I also discussed with the patient that there may be a patient responsible charge related to this service. The patient expressed understanding and agreed to proceed.     I discussed the assessment and treatment plan with the patient. The patient was provided an opportunity to ask questions and all were answered. The patient agreed with the plan and demonstrated an understanding of the instructions.   The patient was advised to call back or seek an in-person evaluation if the symptoms worsen or if the condition fails to improve as anticipated.  I provided 15 minutes of non-face-to-face time during this encounter.   Jared Spiller, MD  [facility] MD/PA/NP OP Progress Note  01/26/2021 9:54 AM Jared Griffin  MRN:  [id number]  Chief Complaint:  Chief Complaint    Anxiety; Depression; Follow-up     HPI: This patient is a 60 year old married white male lives with his wife and son in [place]. 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 zonebut had an arm injury at work and had to quit there as well.  The patient was referred by his primary physician, Dr. [name], for further assessment and treatment of anxiety and depression.  The patient states that he is become increasingly depressed and anxious over the last couple of years due to work stress. He stated that he was always under pressure to get more things done than he had time to do. 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 He got to the point of hating going to work. Several years ago he had cardiac problems and had a stent placed. In January of this year he began developing dizzy spells chest pain headaches stomach aches. It got so bad that he was readmitted for cardiac catheterization but nothing new was found.  The patient was having difficulty sleeping constant worry and anxiety about his job sadness and anger. He was getting irritable with people at home. After talking to his primary care physician at length and decided to take him out on medical retirement. The patient has been on Wellbutrin SR 167mtwice a day for several weeks now. He had also been out in the past when his wife was sick. He has not seen any benefit from this but does feel tremendously better since he stopped working. He is also on Xanax 0.5 mg 3 times a day which he thinks has helped to some degree.  Since stopping working he's had a big turnaround. His sleep and energy have improved. 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 He is much less anxious and is no longer having the chest pain dizzy spells or stomachaches. He denies suicidal ideation or psychotic symptoms. He does not use drugs or alcohol  The patient returns for follow-up after 3 months.  He states that generally he has been doing somewhat better.  He is trying to get more exercise and  eat healthier to get his blood sugar down and so far he has been successful.  He denies serious depression suicidal ideation or anxiety.  His wife's health has improved which has made things easier for him as well.  He is sleeping well. Visit Diagnosis:    ICD-10-CM   1. Moderate single current episode of major depressive disorder ([place])  F32.1     Past Psychiatric History: none  Past Medical History:  Past Medical History:  Diagnosis Date  . Anxiety   . COPD (chronic  obstructive pulmonary disease) ([place])   . Coronary atherosclerosis of native coronary artery    a. 12/09/2013: s/p PCI with 3.0 x 28 Promus DES to mLAD  . Depression   . Diabetic neuropathy ([place])   . Elbow injury 07/25/2017   Surgery for torn tendon  . Essential hypertension   . Fatty liver disease, nonalcoholic   . Lateral epicondylitis of right elbow   . Mixed hyperlipidemia   . Obesity   . Osgood-Schlatter's disease of right knee   . Skin cyst 10/03/2017   Removed from back  . Type 2 diabetes mellitus ([place])     Past Surgical History:  Procedure Laterality Date  . BIOPSY  01/21/2019   Procedure: BIOPSY;  Surgeon: [name], MD;  [facility];  Service: Endoscopy;;  gastric bx and gastric polyps  . BIOPSY  11/08/2020   Procedure: BIOPSY;  Surgeon: [name], MD;  [facility];  Service: Gastroenterology;;  . CARDIAC CATHETERIZATION  2011  . CARDIAC CATHETERIZATION N/A 09/20/2015   Procedure: Left Heart Cath and Coronary Angiography;  Surgeon: [name], MD;  Location: [facility];  Service: Cardiovascular;  Laterality: N/A;  . CHOLECYSTECTOMY  2003  . COLONOSCOPY  06/19/2012   Procedure: COLONOSCOPY;  Surgeon: [name], MD;  [facility];  Service: Endoscopy;  Laterality: N/A;  930  . COLONOSCOPY N/A 10/17/2017   Procedure: COLONOSCOPY;  Surgeon: [name], MD;  [facility];  Service: Endoscopy;  Laterality: N/A;  1225  . COLONOSCOPY WITH PROPOFOL N/A 11/08/2020   Procedure: COLONOSCOPY WITH PROPOFOL;  Surgeon: [name], MD;  [facility];  Service: Gastroenterology;  Laterality: N/A;  10:45  . CYST EXCISION  07/2019  . ESOPHAGOGASTRODUODENOSCOPY (EGD) WITH PROPOFOL N/A 01/21/2019   Procedure: ESOPHAGOGASTRODUODENOSCOPY (EGD) WITH PROPOFOL;  Surgeon: [name], MD;  [facility];  Service: Endoscopy;  Laterality: N/A;  10:15am  . KNEE  ARTHROPLASTY Right 1999  . LEFT HEART CATH AND CORONARY ANGIOGRAPHY N/A 02/26/2018   Procedure: LEFT HEART CATH AND CORONARY ANGIOGRAPHY;  Surgeon: [name], MD;  Location: [facility];  Service: Cardiovascular;  Laterality: N/A;  . LEFT HEART CATHETERIZATION WITH CORONARY ANGIOGRAM N/A 12/09/2013   Procedure: LEFT HEART CATHETERIZATION WITH CORONARY ANGIOGRAM;  Surgeon: [name], MD;  Location: [facility] CATH LAB;  Service: Cardiovascular;  Laterality: N/A;  . Liver biopsy    . POLYPECTOMY  10/17/2017   Procedure: POLYPECTOMY;  Surgeon: [name], MD;  [facility];  Service: Endoscopy;;  ascending colon, splenic flexure,sigmoid x2  . POLYPECTOMY  11/08/2020   Procedure: POLYPECTOMY;  Surgeon: [name], MD;  [facility];  Service: Gastroenterology;;  . Joanna Puff ELBOW RELEASE/NIRSCHEL PROCEDURE Right 07/25/2017   Procedure: TENNIS ELBOW RELEASE and debriedment;  Surgeon: [name], MD;  Location: [facility];  Service: Orthopedics;  Laterality: Right;  .  TONSILLECTOMY  1970's    Family Psychiatric History: see below  Family History:  Family History  Problem Relation Age of Onset  . Osteoporosis Mother   . Cancer Maternal Aunt   . CVA Maternal Grandmother   . CVA Maternal Grandfather   . Healthy Son     Social History:  Social History   Socioeconomic History  . Marital status: Married    Spouse name: Not on file  . Number of children: Not on file  . Years of education: Not on file  . Highest education level: Not on file  Occupational History  . Not on file  Tobacco Use  . Smoking status: Former Smoker    Packs/day: 0.50    Years: 10.00    Pack years: 5.00    Types: Cigarettes    Start date: 02/02/1973    Quit date: 09/17/1983    Years since quitting: 37.3  . Smokeless tobacco: Former Systems developer    Types: [place] date: 09/17/1983  Vaping Use  . Vaping Use: Never used  Substance and Sexual Activity  .  Alcohol use: No    Alcohol/week: 0.0 standard drinks  . Drug use: No  . Sexual activity: Yes  Other Topics Concern  . 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    Married with 1 child   Right handed    46 th    Very little caffeine   Social Determinants of Radio broadcast assistant Strain: Not on file  Food Insecurity: Not on file  Transportation Needs: Not on file  Physical Activity: Not on file  Stress: Not on file  Social Connections: Not on file    Allergies:  Allergies  Allergen Reactions  . Metoprolol Rash    Metabolic Disorder Labs: Lab Results  Component Value Date   HGBA1C 6.6 10/18/2020   MPG 211.6 07/12/2018   MPG 197.25 07/23/2017   No results found for: PROLACTIN Lab Results  Component Value Date   CHOL 116 10/18/2018   TRIG 173 (H) 10/18/2018   HDL 30 (L) 10/18/2018   CHOLHDL 3.9 10/18/2018   VLDL UNABLE TO CALCULATE IF TRIGLYCERIDE OVER 400 mg/dL 07/12/2018   LDLCALC 51 10/18/2018   LDLCALC UNABLE TO CALCULATE IF TRIGLYCERIDE OVER 400 mg/dL 07/12/2018   Lab Results  Component Value Date   TSH 0.648 08/20/2019   TSH 0.65 08/20/2019    Therapeutic Level Labs: No results found for: LITHIUM No results found for: VALPROATE No components found for:  CBMZ  Current Medications: Current Outpatient Medications  Medication Sig Dispense Refill  . acetaminophen (TYLENOL) 500 MG tablet Take 1,500 mg by mouth every 8 (eight) hours as needed for moderate pain.    Marland Kitchen albuterol (VENTOLIN HFA) 108 (90 Base) MCG/ACT inhaler INHALE 2 PUFFS INTO LUNGS EVERY 6 HOURS AS NEEDED FOR WHEEZING AND SHORTNESS OF BREATH. (Patient taking differently: Inhale 2 puffs into the lungs every 6 (six) hours as needed for shortness of breath or wheezing. INHALE 2 PUFFS INTO LUNGS EVERY 6 HOURS AS NEEDED FOR WHEEZING AND SHORTNESS OF BREATH.) 8.5 g 5  . ALPRAZolam (XANAX) 1 MG tablet Take 1  tablet (1 mg total) by mouth 3 (three) times daily as needed for anxiety. 90 tablet 2  . aspirin 81 MG EC tablet Take 1 tablet (81 mg total) by mouth daily. 30 tablet 12  . atorvastatin (LIPITOR) 40  MG tablet Take 1 tablet (40 mg total) by mouth daily. 90 tablet 1  . B-D UF III MINI PEN NEEDLES 31G X 5 MM MISC USE UP TO 4 TIMES DAILY WITH INSULIN AS DIRECTED. 150 each 5  . bisoprolol (ZEBETA) 5 MG tablet Take 1 tablet (5 mg total) by mouth daily. 90 tablet 2  . cetirizine (ZYRTEC) 10 MG tablet Take 1 tablet (10 mg total) by mouth daily. (Patient taking differently: Take 10 mg by mouth daily as needed for allergies.) 30 tablet 0  . Continuous Blood Gluc Sensor (FREESTYLE LIBRE 14 DAY SENSOR) MISC Inject 1 each into the skin every 14 (fourteen) days. Use as directed. 2 each 2  . dapagliflozin propanediol (FARXIGA) 5 MG TABS tablet Take 5 mg by mouth daily.    Marland Kitchen dicyclomine (BENTYL) 10 MG capsule Take 1 capsule (10 mg total) by mouth 2 (two) times daily before a meal. 60 capsule 5  . Exenatide ER (BYDUREON BCISE) 2 MG/0.85ML AUIJ Inject 2 mg into the skin every Monday.    . fluticasone (FLONASE) 50 MCG/ACT nasal spray Place 2 sprays into both nostrils daily. (Patient taking differently: Place 2 sprays into both nostrils daily as needed for allergies.) 16 g 0  . fluticasone-salmeterol (ADVAIR HFA) 115-21 MCG/ACT inhaler Inhale 2 puffs into the lungs 2 (two) times daily. 1 Inhaler 5  . gabapentin (NEURONTIN) 300 MG capsule TAKE ONE (1) CAPSULE BY MOUTH 4 TIMES DAILY (Patient taking differently: Take 300 mg by mouth 2 (two) times daily.) 120 capsule 5  . HUMALOG KWIKPEN 200 UNIT/ML KwikPen Inject 35-40 Units into the skin 3 (three) times daily.    . hyoscyamine (LEVSIN SL) 0.125 MG SL tablet Place 1 tablet (0.125 mg total) under the tongue every 4 (four) hours as needed. (Patient taking differently: Place 0.125 mg under the tongue every 4 (four) hours as needed for cramping.) 30 tablet 0  . ipratropium  (ATROVENT) 0.06 % nasal spray Place 2 sprays into both nostrils in the morning and at bedtime.    . Lancets MISC 1 each by Does not apply route 4 (four) times daily. 150 each 5  . lidocaine (LIDODERM) 5 % Place 1 patch onto the skin daily. Place 1 patch to your are of most significant pain on your neck once per day as needed. Remove & Discard patch within 12 hours (Patient taking differently: Place 1 patch onto the skin daily as needed (pain). Place 1 patch to your are of most significant pain on your neck once per day as needed. Remove & Discard patch within 12 hours) 30 patch 0  . losartan (COZAAR) 25 MG tablet TAKE (1) TABLET BY MOUTH ONCE DAILY. (Patient taking differently: Take 25 mg by mouth daily.) 90 tablet 3  . metFORMIN (GLUCOPHAGE) 500 MG tablet TAKE 2 TABLETS BY MOUTH TWICE DAILY WITH A MEAL. (Patient taking differently: Take 1,000 mg by mouth 2 (two) times daily with a meal.) 120 tablet 0  . methocarbamol (ROBAXIN) 500 MG tablet Take 500 mg by mouth every 8 (eight) hours as needed for muscle spasms.    . nitroGLYCERIN (NITROSTAT) 0.4 MG SL tablet Place 1 tablet (0.4 mg total) under the tongue every 5 (five) minutes as needed. (Patient taking differently: Place 0.4 mg under the tongue every 5 (five) minutes as needed for chest pain.) 25 tablet 3  . ondansetron (ZOFRAN) 4 MG tablet Take 4 mg by mouth every 8 (eight) hours as needed for nausea or vomiting.    Marland Kitchen  pantoprazole (PROTONIX) 40 MG tablet TAKE ONE TABLET BY MOUTH TWICE A DAY BEFORE A MEAL. 60 tablet 3  . ranolazine (RANEXA) 1000 MG SR tablet Take 1 tablet (1,000 mg total) by mouth 2 (two) times daily. 180 tablet 1  . sertraline (ZOLOFT) 100 MG tablet Take 1.5 tablets (150 mg total) by mouth daily. 135 tablet 2  . TOUJEO MAX SOLOSTAR 300 UNIT/ML Solostar Pen INJECT 130 UNITS INTO THE SKIN AT BEDTIME. (Patient taking differently: Inject 60 Units into the skin at bedtime.) 12 mL 0  . traZODone (DESYREL) 50 MG tablet Take 1 tablet (50 mg  total) by mouth at bedtime as needed. (Patient taking differently: Take 50 mg by mouth at bedtime as needed for sleep.) 30 tablet 2   No current facility-administered medications for this visit.     Musculoskeletal: Strength & Muscle Tone: within normal limits Gait & Station: normal Patient leans: N/A  Psychiatric Specialty Exam: Review of Systems  All other systems reviewed and are negative.   There were no vitals taken for this visit.There is no height or weight on file to calculate BMI.  General Appearance: NA  Eye Contact:  NA  Speech:  Clear and Coherent  Volume:  Normal  Mood:  Euthymic  Affect:  NA  Thought Process:  Goal Directed  Orientation:  Full (Time, Place, and Person)  Thought Content: WDL   Suicidal Thoughts:  No  Homicidal Thoughts:  No  Memory:  Immediate;   Good Recent;   Good Remote;   Good  Judgement:  Good  Insight:  Good  Psychomotor Activity:  Normal  Concentration:  Concentration: Good and Attention Span: Good  Recall:  Good  Fund of Knowledge: Good  Language: Good  Akathisia:  No  Handed:  Right  AIMS (if indicated): not done  Assets:  Communication Skills Desire for Improvement Resilience Social Support Talents/Skills  ADL's:  Intact  Cognition: WNL  Sleep:  Good   Screenings: GAD-7   Flowsheet Row Office Visit from 08/08/2020 in [facility] Visit from 08/05/2018 in [facility]  Total GAD-7 Score 3 5    PHQ2-9   Flowsheet Row Video Visit from 01/26/2021 in [facility] Office Visit from 12/12/2020 in [facility] Visit from 12/07/2020 in [facility] Visit from 08/08/2020 in [facility] Visit from 12/09/2019 in [facility]  PHQ-2 Total Score 1 0 0 4 5  PHQ-9 Total Score -- -- -- 10 11    Flowsheet Row Video Visit from 01/26/2021 in [facility]  [facility] Admission (Discharged) from 11/08/2020 in [facility] No Risk No Risk       Assessment and Plan: This patient is a 60 year old male with a history of depression anxiety and chronic pain.  He seems to be doing much better.  He will continue Xanax 1 mg up to 3 times daily as needed for anxiety, Zoloft 150 mg daily for depression.  He rarely takes the trazodone 50 mg at bedtime for sleep.  He will return to see me in 3 months   Jared Spiller, MD 01/26/2021, 9:54 AM

## 2021-01-31 ENCOUNTER — Encounter (HOSPITAL_COMMUNITY): Payer: Self-pay | Admitting: Physical Therapy

## 2021-01-31 ENCOUNTER — Ambulatory Visit (HOSPITAL_COMMUNITY): Payer: Medicare Other | Admitting: Physical Therapy

## 2021-01-31 ENCOUNTER — Other Ambulatory Visit: Payer: Self-pay

## 2021-01-31 DIAGNOSIS — R29898 Other symptoms and signs involving the musculoskeletal system: Secondary | ICD-10-CM

## 2021-01-31 DIAGNOSIS — M6281 Muscle weakness (generalized): Secondary | ICD-10-CM | POA: Diagnosis not present

## 2021-01-31 DIAGNOSIS — M542 Cervicalgia: Secondary | ICD-10-CM | POA: Diagnosis not present

## 2021-01-31 NOTE — Therapy (Signed)
[facility] [address] [place], [place], [address] Phone: [phone]   Fax:  [phone]  Physical Therapy Treatment  Patient Details  Name: Jared Griffin MRN: [id number] Date of Birth: 24-Jan-1961 Referring Provider (PT): Erline Levine MD   Encounter Date: 01/31/2021   PT End of Session - 01/31/21 0826    Visit Number 3    Number of Visits 8    Date for PT Re-Evaluation 02/16/21    Authorization Type UHC Medicare (no auth, no VL)    PT Start Time 0827    PT Stop Time 0907    PT Time Calculation (min) 40 min    Activity Tolerance Patient tolerated treatment well    Behavior During Therapy [facility] for tasks assessed/performed           Past Medical History:  Diagnosis Date  . Anxiety   . COPD (chronic obstructive pulmonary disease) ([place])   . Coronary atherosclerosis of native coronary artery    a. 12/09/2013: s/p PCI with 3.0 x 28 Promus DES to mLAD  . Depression   . Diabetic neuropathy ([place])   . Elbow injury 07/25/2017   Surgery for torn tendon  . Essential hypertension   . Fatty liver disease, nonalcoholic   . Lateral epicondylitis of right elbow   . Mixed hyperlipidemia   . Obesity   . Osgood-Schlatter's disease of right knee   . Skin cyst 10/03/2017   Removed from back  . Type 2 diabetes mellitus ([place])     Past Surgical History:  Procedure Laterality Date  . BIOPSY  01/21/2019   Procedure: BIOPSY;  Surgeon: [name], MD;  [facility];  Service: Endoscopy;;  gastric bx and gastric polyps  . BIOPSY  11/08/2020   Procedure: BIOPSY;  Surgeon: [name], MD;  [facility];  Service: Gastroenterology;;  . CARDIAC CATHETERIZATION  2011  . CARDIAC CATHETERIZATION N/A 09/20/2015   Procedure: Left Heart Cath and Coronary Angiography;  Surgeon: [name], MD;  Location: [facility];  Service: Cardiovascular;  Laterality: N/A;  . CHOLECYSTECTOMY  2003  .  COLONOSCOPY  06/19/2012   Procedure: COLONOSCOPY;  Surgeon: [name], MD;  [facility];  Service: Endoscopy;  Laterality: N/A;  930  . COLONOSCOPY N/A 10/17/2017   Procedure: COLONOSCOPY;  Surgeon: [name], MD;  [facility];  Service: Endoscopy;  Laterality: N/A;  1225  . COLONOSCOPY WITH PROPOFOL N/A 11/08/2020   Procedure: COLONOSCOPY WITH PROPOFOL;  Surgeon: [name], MD;  [facility];  Service: Gastroenterology;  Laterality: N/A;  10:45  . CYST EXCISION  07/2019  . ESOPHAGOGASTRODUODENOSCOPY (EGD) WITH PROPOFOL N/A 01/21/2019   Procedure: ESOPHAGOGASTRODUODENOSCOPY (EGD) WITH PROPOFOL;  Surgeon: [name], MD;  [facility];  Service: Endoscopy;  Laterality: N/A;  10:15am  . KNEE ARTHROPLASTY Right 1999  . LEFT HEART CATH AND CORONARY ANGIOGRAPHY N/A 02/26/2018   Procedure: LEFT HEART CATH AND CORONARY ANGIOGRAPHY;  Surgeon: [name], MD;  Location: [facility];  Service: Cardiovascular;  Laterality: N/A;  . LEFT HEART CATHETERIZATION WITH CORONARY ANGIOGRAM N/A 12/09/2013   Procedure: LEFT HEART CATHETERIZATION WITH CORONARY ANGIOGRAM;  Surgeon: [name], MD;  Location: [facility] CATH LAB;  Service: Cardiovascular;  Laterality: N/A;  . Liver biopsy    . POLYPECTOMY  10/17/2017   Procedure: POLYPECTOMY;  Surgeon: [name], MD;  [facility];  Service: Endoscopy;;  ascending colon, splenic flexure,sigmoid x2  . POLYPECTOMY  11/08/2020   Procedure: POLYPECTOMY;  Surgeon: [name], MD;  [facility];  Service: Gastroenterology;;  . Joanna Puff ELBOW RELEASE/NIRSCHEL PROCEDURE Right 07/25/2017   Procedure: TENNIS ELBOW RELEASE and debriedment;  Surgeon: [name], MD;  Location: [facility];  Service: Orthopedics;  Laterality: Right;  . TONSILLECTOMY  1970's    There were no vitals filed for this visit.   Subjective Assessment - 01/31/21 0827     Subjective Patient states his neck has been doing pretty good. He has had a headache. His exercises are going well.    Currently in Pain? No/denies                             [facility] Adult PT Treatment/Exercise - 01/31/21 0001      Neck Exercises: Standing   Other Standing Exercises scap retraction with external rotation blue band 2x 10; horizontal abduction 2x10 blue band, PNF D2 pattern blue band 2x 10 bilateral    Other Standing Exercises shoulder row and extension 2x10 bilateral      Neck Exercises: Seated   Neck Retraction 10 reps                  PT Education - 01/31/21 0827    Education Details HEP, exercise mechanics    Person(s) Educated Patient    Methods Explanation;Demonstration    Comprehension Verbalized understanding;Returned demonstration            PT Short Term Goals - 01/19/21 1331      PT SHORT TERM GOAL #1   Title Patient will be independent with HEP in order to improve functional outcomes.    Time 2    Period Weeks    Status New    Target Date 02/02/21      PT SHORT TERM GOAL #2   Title Patient will report at least 25% improvement in symptoms for improved quality of life.    Time 2    Period Weeks    Status New    Target Date 02/02/21             PT Long Term Goals - 01/19/21 1332      PT LONG TERM GOAL #1   Title Patient will report at least 75% improvement in symptoms for improved quality of life.    Time 4    Period Weeks    Status New    Target Date 02/16/21      PT LONG TERM GOAL #2   Title Patient will improve FOTO score by at least 10 points in order to indicate improved tolerance to activity.    Time 4    Period Weeks    Status New    Target Date 02/16/21      PT LONG TERM GOAL #3   Title Patient will demonstrate at least 25% improvement in cervical ROM in all planes for improved ability to move head while driving.    Time 4    Period Weeks    Status New    Target Date 02/16/21                  Plan - 01/31/21 0826    Clinical Impression Statement Patient overall progressing well with PT with minimal symptoms recently. Patient also showing imp Added postural strengthening in standing with resistance which patient tolerates  well without increase in symptoms. Patient requires prior demonstration and minimal cueing for proper mechanics with good carryover. Anticipate d/c in next several sessions if symptoms remain under control. Patient will continue to benefit from skilled physical therapy in order to reduce impairment and improve function.    Personal Factors and Comorbidities Behavior Pattern;Social Background;Comorbidity 3+;Fitness;Time since onset of injury/illness/exacerbation    Comorbidities Angina, Anxiety, Arthritis, Back pain, BMI over 30, Depression,   Diabetes,  Headaches, High Blood Pressure    Examination-Activity Limitations Sleep;Bend;Lift;Reach Overhead;Carry    Examination-Participation Restrictions Cleaning;Community Activity;Church;Shop;Other   driving   Stability/Clinical Decision Making Stable/Uncomplicated    Rehab Potential Good    PT Frequency 2x / week    PT Duration 4 weeks    PT Treatment/Interventions ADLs/Self Care Home Management;Aquatic Therapy;Cryotherapy;Electrical Stimulation;Moist Heat;Traction;Ultrasound;Parrafin;Fluidtherapy;Contrast Bath;DME Instruction;Gait training;Stair training;Functional mobility training;Therapeutic exercise;Balance training;Therapeutic activities;Neuromuscular re-education;Patient/family education;Orthotic Fit/Training;Manual techniques;Compression bandaging;Scar mobilization;Passive range of motion;Dry needling;Energy conservation;Splinting;Spinal Manipulations;Joint Manipulations;Taping    PT Next Visit Plan Continue manual for pain/mobility and progress strengthening, cervical mobility and strength, thoracic mobility    PT Home Exercise Plan 5/5 supine cervical retractions 5/17 Row, extension, DRE, horizontal abd,  PNF D2    Consulted and Agree with Plan of Care Patient           Patient will benefit from skilled therapeutic intervention in order to improve the following deficits and impairments:  Hypomobility,Decreased activity tolerance,Decreased strength,Pain,Decreased mobility,Increased muscle spasms,Decreased range of motion,Improper body mechanics,Postural dysfunction,Impaired flexibility  Visit Diagnosis: Cervicalgia  Muscle weakness (generalized)  Other symptoms and signs involving the musculoskeletal system     Problem List Patient Active Problem List   Diagnosis Date Noted  . Orthostatic hypotension 12/07/2020  . IBS (irritable bowel syndrome) 11/17/2020  . Chronic diarrhea 08/17/2020  . NASH (nonalcoholic steatohepatitis) 08/17/2020  . Elevated LFTs 08/17/2020  . Tremor of both hands 05/06/2020  . Bilateral occipital neuralgia 05/06/2020  . Abdominal pain, chronic, epigastric 01/13/2019  . Anxiety 01/04/2019  . Radicular pain of left lower extremity 12/22/2018  . Moderate persistent asthma 10/25/2018  . Dizziness 07/12/2018  . Nonintractable headache   . Abnormal nuclear stress test   . Aftercare following surgery 07/25/17 08/13/2017  . Hx of colonic polyps 08/07/2017  . Lateral epicondylitis of right elbow   . Cervical nerve root impingement 12/09/2015  . Generalized anxiety disorder 09/27/2015  . Chest pain 09/20/2015  . Pain in the chest   . Depression 01/24/2015  . Esophageal reflux 01/24/2015  . Preoperative cardiovascular examination 06/28/2014  . DM type 2 causing vascular disease ([place])   . Mixed hyperlipidemia   . Obesity   . Intermediate coronary syndrome ([place]) 12/09/2013  . Lumbago 05/21/2012  . Essential hypertension, benign 12/28/2009  . CAD S/P LAD DES March 2015 12/28/2009  . Hyperlipidemia LDL goal <70 11/25/2009  . Accelerating angina ([place]) 11/25/2009  . DM 11/24/2009  . ABDOMINAL PAIN, HX OF 11/24/2009    9:09 AM, 01/31/21 Mearl Latin PT, DPT Physical Therapist at [facility] [place], [place], [address] Phone: [phone]   Fax:  [phone]  Name: Jared Griffin MRN: [id number] Date of Birth: 1961-07-03

## 2021-02-02 ENCOUNTER — Ambulatory Visit (HOSPITAL_COMMUNITY): Payer: Medicare Other | Admitting: Physical Therapy

## 2021-02-03 ENCOUNTER — Other Ambulatory Visit: Payer: Self-pay

## 2021-02-03 ENCOUNTER — Ambulatory Visit
Admission: EM | Admit: 2021-02-03 | Discharge: 2021-02-03 | Disposition: A | Discharge status: Home / Self Care | Payer: Medicare Other | Attending: Emergency Medicine | Admitting: Emergency Medicine

## 2021-02-03 ENCOUNTER — Encounter: Payer: Self-pay | Admitting: Emergency Medicine

## 2021-02-03 DIAGNOSIS — J0140 Acute pansinusitis, unspecified: Secondary | ICD-10-CM

## 2021-02-03 DIAGNOSIS — R0981 Nasal congestion: Secondary | ICD-10-CM

## 2021-02-03 MED ORDER — DOXYCYCLINE HYCLATE 100 MG PO CAPS: 100.0000 mg | ORAL_CAPSULE | Freq: Two times a day (BID) | ORAL | 0 refills | Status: DC

## 2021-02-03 MED ORDER — BENZONATATE 100 MG PO CAPS: 100.0000 mg | ORAL_CAPSULE | Freq: Three times a day (TID) | ORAL | 0 refills | Status: DC

## 2021-02-03 NOTE — ED Provider Notes (Signed)
[facility]   [id number] 02/03/21 Arrival Time: 0927   CC: COVID symptoms  SUBJECTIVE: History from: patient.  Jared Griffin is a 60 y.o. male who presents with sinus pain, pressure, cough, congestion, and tooth pain x 2 weeks.  Denies sick exposure to COVID, flu or strep.  Has tried OTC medications without relief.  Denies aggravating factors.  Reports previous symptoms in the past.   Denies fever, chills, SOB, wheezing, chest pain, nausea, changes in bowel or bladder habits.     ROS: As per HPI.  All other pertinent ROS negative.     Past Medical History:  Diagnosis Date  . Anxiety   . COPD (chronic obstructive pulmonary disease) ([place])   . Coronary atherosclerosis of native coronary artery    a. 12/09/2013: s/p PCI with 3.0 x 28 Promus DES to mLAD  . Depression   . Diabetic neuropathy ([place])   . Elbow injury 07/25/2017   Surgery for torn tendon  . Essential hypertension   . Fatty liver disease, nonalcoholic   . Lateral epicondylitis of right elbow   . Mixed hyperlipidemia   . Obesity   . Osgood-Schlatter's disease of right knee   . Skin cyst 10/03/2017   Removed from back  . Type 2 diabetes mellitus ([place])    Past Surgical History:  Procedure Laterality Date  . BIOPSY  01/21/2019   Procedure: BIOPSY;  Surgeon: [name], MD;  [facility];  Service: Endoscopy;;  gastric bx and gastric polyps  . BIOPSY  11/08/2020   Procedure: BIOPSY;  Surgeon: [name], MD;  [facility];  Service: Gastroenterology;;  . CARDIAC CATHETERIZATION  2011  . CARDIAC CATHETERIZATION N/A 09/20/2015   Procedure: Left Heart Cath and Coronary Angiography;  Surgeon: [name], MD;  Location: [facility];  Service: Cardiovascular;  Laterality: N/A;  . CHOLECYSTECTOMY  2003  . COLONOSCOPY  06/19/2012   Procedure: COLONOSCOPY;  Surgeon: [name], MD;  [facility];  Service: Endoscopy;  Laterality: N/A;  930   . COLONOSCOPY N/A 10/17/2017   Procedure: COLONOSCOPY;  Surgeon: [name], MD;  [facility];  Service: Endoscopy;  Laterality: N/A;  1225  . COLONOSCOPY WITH PROPOFOL N/A 11/08/2020   Procedure: COLONOSCOPY WITH PROPOFOL;  Surgeon: [name], MD;  [facility];  Service: Gastroenterology;  Laterality: N/A;  10:45  . CYST EXCISION  07/2019  . ESOPHAGOGASTRODUODENOSCOPY (EGD) WITH PROPOFOL N/A 01/21/2019   Procedure: ESOPHAGOGASTRODUODENOSCOPY (EGD) WITH PROPOFOL;  Surgeon: [name], MD;  [facility];  Service: Endoscopy;  Laterality: N/A;  10:15am  . KNEE ARTHROPLASTY Right 1999  . LEFT HEART CATH AND CORONARY ANGIOGRAPHY N/A 02/26/2018   Procedure: LEFT HEART CATH AND CORONARY ANGIOGRAPHY;  Surgeon: [name], MD;  Location: [facility];  Service: Cardiovascular;  Laterality: N/A;  . LEFT HEART CATHETERIZATION WITH CORONARY ANGIOGRAM N/A 12/09/2013   Procedure: LEFT HEART CATHETERIZATION WITH CORONARY ANGIOGRAM;  Surgeon: [name], MD;  Location: [facility] CATH LAB;  Service: Cardiovascular;  Laterality: N/A;  . Liver biopsy    . POLYPECTOMY  10/17/2017   Procedure: POLYPECTOMY;  Surgeon: [name], MD;  [facility];  Service: Endoscopy;;  ascending colon, splenic flexure,sigmoid x2  . POLYPECTOMY  11/08/2020   Procedure: POLYPECTOMY;  Surgeon: [name], MD;  [facility];  Service: Gastroenterology;;  . Joanna Puff ELBOW RELEASE/NIRSCHEL PROCEDURE Right  07/25/2017   Procedure: TENNIS ELBOW RELEASE and debriedment;  Surgeon: [name], MD;  Location: [facility];  Service: Orthopedics;  Laterality: Right;  . TONSILLECTOMY  1970's   No Known Allergies No current facility-administered medications on file prior to encounter.   Current Outpatient Medications on File Prior to Encounter  Medication Sig Dispense Refill  . acetaminophen (TYLENOL) 500 MG tablet Take 1,500  mg by mouth every 8 (eight) hours as needed for moderate pain.    Marland Kitchen albuterol (VENTOLIN HFA) 108 (90 Base) MCG/ACT inhaler INHALE 2 PUFFS INTO LUNGS EVERY 6 HOURS AS NEEDED FOR WHEEZING AND SHORTNESS OF BREATH. (Patient taking differently: Inhale 2 puffs into the lungs every 6 (six) hours as needed for shortness of breath or wheezing. INHALE 2 PUFFS INTO LUNGS EVERY 6 HOURS AS NEEDED FOR WHEEZING AND SHORTNESS OF BREATH.) 8.5 g 5  . ALPRAZolam (XANAX) 1 MG tablet Take 1 tablet (1 mg total) by mouth 3 (three) times daily as needed for anxiety. 90 tablet 2  . aspirin 81 MG EC tablet Take 1 tablet (81 mg total) by mouth daily. 30 tablet 12  . atorvastatin (LIPITOR) 40 MG tablet Take 1 tablet (40 mg total) by mouth daily. 90 tablet 1  . B-D UF III MINI PEN NEEDLES 31G X 5 MM MISC USE UP TO 4 TIMES DAILY WITH INSULIN AS DIRECTED. 150 each 5  . bisoprolol (ZEBETA) 5 MG tablet Take 1 tablet (5 mg total) by mouth daily. 90 tablet 2  . cetirizine (ZYRTEC) 10 MG tablet Take 1 tablet (10 mg total) by mouth daily. (Patient taking differently: Take 10 mg by mouth daily as needed for allergies.) 30 tablet 0  . Continuous Blood Gluc Sensor (FREESTYLE LIBRE 14 DAY SENSOR) MISC Inject 1 each into the skin every 14 (fourteen) days. Use as directed. 2 each 2  . dapagliflozin propanediol (FARXIGA) 5 MG TABS tablet Take 5 mg by mouth daily.    Marland Kitchen dicyclomine (BENTYL) 10 MG capsule Take 1 capsule (10 mg total) by mouth 2 (two) times daily before a meal. 60 capsule 5  . Exenatide ER (BYDUREON BCISE) 2 MG/0.85ML AUIJ Inject 2 mg into the skin every Monday.    . fluticasone (FLONASE) 50 MCG/ACT nasal spray Place 2 sprays into both nostrils daily. (Patient taking differently: Place 2 sprays into both nostrils daily as needed for allergies.) 16 g 0  . fluticasone-salmeterol (ADVAIR HFA) 115-21 MCG/ACT inhaler Inhale 2 puffs into the lungs 2 (two) times daily. 1 Inhaler 5  . gabapentin (NEURONTIN) 300 MG capsule TAKE ONE (1)  CAPSULE BY MOUTH 4 TIMES DAILY (Patient taking differently: Take 300 mg by mouth 2 (two) times daily.) 120 capsule 5  . HUMALOG KWIKPEN 200 UNIT/ML KwikPen Inject 35-40 Units into the skin 3 (three) times daily.    . hyoscyamine (LEVSIN SL) 0.125 MG SL tablet Place 1 tablet (0.125 mg total) under the tongue every 4 (four) hours as needed. (Patient taking differently: Place 0.125 mg under the tongue every 4 (four) hours as needed for cramping.) 30 tablet 0  . ipratropium (ATROVENT) 0.06 % nasal spray Place 2 sprays into both nostrils in the morning and at bedtime.    . Lancets MISC 1 each by Does not apply route 4 (four) times daily. 150 each 5  . lidocaine (LIDODERM) 5 % Place 1 patch onto the skin daily. Place 1 patch to your are of most significant pain on your neck once per day as needed. Remove &  Discard patch within 12 hours (Patient taking differently: Place 1 patch onto the skin daily as needed (pain). Place 1 patch to your are of most significant pain on your neck once per day as needed. Remove & Discard patch within 12 hours) 30 patch 0  . losartan (COZAAR) 25 MG tablet TAKE (1) TABLET BY MOUTH ONCE DAILY. (Patient taking differently: Take 25 mg by mouth daily.) 90 tablet 3  . metFORMIN (GLUCOPHAGE) 500 MG tablet TAKE 2 TABLETS BY MOUTH TWICE DAILY WITH A MEAL. (Patient taking differently: Take 1,000 mg by mouth 2 (two) times daily with a meal.) 120 tablet 0  . methocarbamol (ROBAXIN) 500 MG tablet Take 500 mg by mouth every 8 (eight) hours as needed for muscle spasms.    . nitroGLYCERIN (NITROSTAT) 0.4 MG SL tablet Place 1 tablet (0.4 mg total) under the tongue every 5 (five) minutes as needed. (Patient taking differently: Place 0.4 mg under the tongue every 5 (five) minutes as needed for chest pain.) 25 tablet 3  . ondansetron (ZOFRAN) 4 MG tablet Take 4 mg by mouth every 8 (eight) hours as needed for nausea or vomiting.    . pantoprazole (PROTONIX) 40 MG tablet TAKE ONE TABLET BY MOUTH TWICE A  DAY BEFORE A MEAL. 60 tablet 3  . ranolazine (RANEXA) 1000 MG SR tablet Take 1 tablet (1,000 mg total) by mouth 2 (two) times daily. 180 tablet 1  . sertraline (ZOLOFT) 100 MG tablet Take 1.5 tablets (150 mg total) by mouth daily. 135 tablet 2  . TOUJEO MAX SOLOSTAR 300 UNIT/ML Solostar Pen INJECT 130 UNITS INTO THE SKIN AT BEDTIME. (Patient taking differently: Inject 60 Units into the skin at bedtime.) 12 mL 0  . traZODone (DESYREL) 50 MG tablet Take 1 tablet (50 mg total) by mouth at bedtime as needed. (Patient taking differently: Take 50 mg by mouth at bedtime as needed for sleep.) 30 tablet 2   Social History   Socioeconomic History  . Marital status: Married    Spouse name: Not on file  . Number of children: Not on file  . Years of education: Not on file  . Highest education level: Not on file  Occupational History  . Not on file  Tobacco Use  . Smoking status: Former Smoker    Packs/day: 0.50    Years: 10.00    Pack years: 5.00    Types: Cigarettes    Start date: 02/02/1973    Quit date: 09/17/1983    Years since quitting: 37.4  . Smokeless tobacco: Former Systems developer    Types: [place] date: 09/17/1983  Vaping Use  . Vaping Use: Never used  Substance and Sexual Activity  . Alcohol use: No    Alcohol/week: 0.0 standard drinks  . Drug use: No  . Sexual activity: Yes  Other Topics Concern  . 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    Married with 1 child   Right handed    71 th    Very little caffeine   Social Determinants of Radio broadcast assistant Strain: Not on file  Food Insecurity: Not on file  Transportation Needs: Not on file  Physical Activity: Not on file  Stress: Not on file  Social Connections: Not on file  Intimate Partner Violence: Not on file   Family History  Problem Relation Age of Onset  . Osteoporosis Mother   .  Cancer Maternal Aunt   . CVA Maternal  Grandmother   . CVA Maternal Grandfather   . Healthy Son     OBJECTIVE:  Vitals:   02/03/21 1013  BP: 111/72  Pulse: (!) 110  Resp: 18  Temp: 98.6 F (37 C)  TempSrc: Oral  SpO2: 94%    General appearance: alert; appears fatigued, but nontoxic; speaking in full sentences and tolerating own secretions HEENT: NCAT; Ears: EACs clear, TMs pearly gray; Eyes: PERRL.  EOM grossly intact. Nose: nares patent without rhinorrhea, Throat: oropharynx clear, tonsils non erythematous or enlarged, uvula midline  Neck: supple without LAD Lungs: unlabored respirations, symmetrical air entry; cough: absent; no respiratory distress; CTAB Heart: regular rate and rhythm.  Skin: warm and dry Psychological: alert and cooperative; normal mood and affect  ASSESSMENT & PLAN:  1. Sinus congestion   2. Acute non-recurrent pansinusitis     Meds ordered this encounter  Medications  . doxycycline (VIBRAMYCIN) 100 MG capsule    Sig: Take 1 capsule (100 mg total) by mouth 2 (two) times daily.    Dispense:  20 capsule    Refill:  0    Order Specific Question:   Supervising Provider    Answer:   Raylene Everts [[id number]]  . benzonatate (TESSALON) 100 MG capsule    Sig: Take 1 capsule (100 mg total) by mouth every 8 (eight) hours.    Dispense:  21 capsule    Refill:  0    Order Specific Question:   Supervising Provider    Answer:   Raylene Everts [[id number]]    Get plenty of rest and push fluids Doxycycline for sinus infection Tessalon Perles prescribed for cough Use OTC zyrtec for nasal congestion, runny nose, and/or sore throat Use OTC flonase for nasal congestion and runny nose Use medications daily for symptom relief Use OTC medications like ibuprofen or tylenol as needed fever or pain Call or go to the ED if you have any new or worsening symptoms such as fever, worsening cough, shortness of breath, chest tightness, chest pain, turning blue, changes in mental status, etc...   Reviewed  expectations re: course of current medical issues. Questions answered. Outlined signs and symptoms indicating need for more acute intervention. Patient verbalized understanding. After Visit Summary given.         Lestine Box, PA-C 02/03/21 1104

## 2021-02-03 NOTE — ED Triage Notes (Signed)
Toothache has seen dentist and was told it was his sinus.  Ear pain and pressure.  Was placed on amoxil with out relief.  Symptoms x 1 week.

## 2021-02-03 NOTE — Discharge Instructions (Signed)
Get plenty of rest and push fluids Doxycycline for sinus infection Tessalon Perles prescribed for cough Use OTC zyrtec for nasal congestion, runny nose, and/or sore throat Use OTC flonase for nasal congestion and runny nose Use medications daily for symptom relief Use OTC medications like ibuprofen or tylenol as needed fever or pain Call or go to the ED if you have any new or worsening symptoms such as fever, worsening cough, shortness of breath, chest tightness, chest pain, turning blue, changes in mental status, etc..Marland Kitchen

## 2021-02-05 ENCOUNTER — Encounter: Payer: Self-pay | Admitting: Family Medicine

## 2021-02-06 ENCOUNTER — Telehealth (HOSPITAL_COMMUNITY): Payer: Self-pay | Admitting: Physical Therapy

## 2021-02-06 ENCOUNTER — Ambulatory Visit: Payer: Medicare Other | Admitting: Family Medicine

## 2021-02-06 NOTE — Telephone Encounter (Signed)
Pt called states he is much better and wants to be d/c

## 2021-02-07 ENCOUNTER — Ambulatory Visit (HOSPITAL_COMMUNITY): Payer: Medicare Other | Admitting: Physical Therapy

## 2021-02-09 ENCOUNTER — Encounter (HOSPITAL_COMMUNITY): Payer: Medicare Other | Admitting: Physical Therapy

## 2021-02-13 NOTE — Progress Notes (Signed)
Cardiology Office Note  Date: 02/14/2021   ID: Jared Griffin, Jared Griffin 06-Jun-1961, MRN [id number]  PCP:  Jared Drown, MD  Cardiologist:  Rozann Lesches, MD Electrophysiologist:  None   Chief Complaint  Patient presents with  . Cardiac follow-up    History of Present Illness: Jared Griffin is a 60 y.o. male last seen in November 2021.  He is here for a follow-up visit.  Reports no worsening angina symptoms, but has had trouble with more prolonged indigestion in the last few days.  Still taking antireflux medications, has tried to cut back spicy foods which has helped to some degree.  I personally reviewed his ECG today which shows normal sinus rhythm, no change from prior tracing.  We went over his medications which are stable from a cardiac perspective.  He does not describe any increasing nitroglycerin requirement.  He did have to cut bisoprolol dose back due to dizziness.  Follow-up echocardiogram in November 2021 revealed LVEF 50 to 55%.  Past Medical History:  Diagnosis Date  . Anxiety   . COPD (chronic obstructive pulmonary disease) ([place])   . Coronary atherosclerosis of native coronary artery    a. 12/09/2013: s/p PCI with 3.0 x 28 Promus DES to mLAD  . Depression   . Diabetic neuropathy ([place])   . Elbow injury 07/25/2017   Surgery for torn tendon  . Essential hypertension   . Fatty liver disease, nonalcoholic   . Lateral epicondylitis of right elbow   . Mixed hyperlipidemia   . Obesity   . Osgood-Schlatter's disease of right knee   . Skin cyst 10/03/2017   Removed from back  . Type 2 diabetes mellitus ([place])     Past Surgical History:  Procedure Laterality Date  . BIOPSY  01/21/2019   Procedure: BIOPSY;  Surgeon: [name], MD;  [facility];  Service: Endoscopy;;  gastric bx and gastric polyps  . BIOPSY  11/08/2020   Procedure: BIOPSY;  Surgeon: [name], MD;  [facility];  Service: Gastroenterology;;  .  CARDIAC CATHETERIZATION  2011  . CARDIAC CATHETERIZATION N/A 09/20/2015   Procedure: Left Heart Cath and Coronary Angiography;  Surgeon: [name], MD;  Location: [facility];  Service: Cardiovascular;  Laterality: N/A;  . CHOLECYSTECTOMY  2003  . COLONOSCOPY  06/19/2012   Procedure: COLONOSCOPY;  Surgeon: [name], MD;  [facility];  Service: Endoscopy;  Laterality: N/A;  930  . COLONOSCOPY N/A 10/17/2017   Procedure: COLONOSCOPY;  Surgeon: [name], MD;  [facility];  Service: Endoscopy;  Laterality: N/A;  1225  . COLONOSCOPY WITH PROPOFOL N/A 11/08/2020   Procedure: COLONOSCOPY WITH PROPOFOL;  Surgeon: [name], MD;  [facility];  Service: Gastroenterology;  Laterality: N/A;  10:45  . CYST EXCISION  07/2019  . ESOPHAGOGASTRODUODENOSCOPY (EGD) WITH PROPOFOL N/A 01/21/2019   Procedure: ESOPHAGOGASTRODUODENOSCOPY (EGD) WITH PROPOFOL;  Surgeon: [name], MD;  [facility];  Service: Endoscopy;  Laterality: N/A;  10:15am  . KNEE ARTHROPLASTY Right 1999  . LEFT HEART CATH AND CORONARY ANGIOGRAPHY N/A 02/26/2018   Procedure: LEFT HEART CATH AND CORONARY ANGIOGRAPHY;  Surgeon: [name], MD;  Location: [facility];  Service: Cardiovascular;  Laterality: N/A;  . LEFT HEART CATHETERIZATION WITH CORONARY ANGIOGRAM N/A 12/09/2013   Procedure: LEFT HEART CATHETERIZATION WITH CORONARY ANGIOGRAM;  Surgeon: [name], MD;  Location[facility];  Service: Cardiovascular;  Laterality: N/A;  . Liver biopsy    . POLYPECTOMY  10/17/2017   Procedure: POLYPECTOMY;  Surgeon: [name], MD;  [facility];  Service: Endoscopy;;  ascending colon, splenic flexure,sigmoid x2  . POLYPECTOMY  11/08/2020   Procedure: POLYPECTOMY;  Surgeon: [name], MD;  [facility];  Service: Gastroenterology;;  . Joanna Puff ELBOW RELEASE/NIRSCHEL PROCEDURE Right 07/25/2017    Procedure: TENNIS ELBOW RELEASE and debriedment;  Surgeon: [name], MD;  Location: [facility];  Service: Orthopedics;  Laterality: Right;  . TONSILLECTOMY  1970's    Current Outpatient Medications  Medication Sig Dispense Refill  . acetaminophen (TYLENOL) 500 MG tablet Take 1,500 mg by mouth every 8 (eight) hours as needed for moderate pain.    Marland Kitchen albuterol (VENTOLIN HFA) 108 (90 Base) MCG/ACT inhaler INHALE 2 PUFFS INTO LUNGS EVERY 6 HOURS AS NEEDED FOR WHEEZING AND SHORTNESS OF BREATH. (Patient taking differently: Inhale 2 puffs into the lungs every 6 (six) hours as needed for shortness of breath or wheezing. INHALE 2 PUFFS INTO LUNGS EVERY 6 HOURS AS NEEDED FOR WHEEZING AND SHORTNESS OF BREATH.) 8.5 g 5  . ALPRAZolam (XANAX) 1 MG tablet Take 1 tablet (1 mg total) by mouth 3 (three) times daily as needed for anxiety. 90 tablet 2  . aspirin 81 MG EC tablet Take 1 tablet (81 mg total) by mouth daily. 30 tablet 12  . atorvastatin (LIPITOR) 40 MG tablet Take 1 tablet (40 mg total) by mouth daily. 90 tablet 1  . B-D UF III MINI PEN NEEDLES 31G X 5 MM MISC USE UP TO 4 TIMES DAILY WITH INSULIN AS DIRECTED. 150 each 5  . benzonatate (TESSALON) 100 MG capsule Take 1 capsule (100 mg total) by mouth every 8 (eight) hours. 21 capsule 0  . bisoprolol (ZEBETA) 5 MG tablet Take 1 tablet (5 mg total) by mouth daily. 90 tablet 2  . cetirizine (ZYRTEC) 10 MG tablet Take 1 tablet (10 mg total) by mouth daily. (Patient taking differently: Take 10 mg by mouth daily as needed for allergies.) 30 tablet 0  . Continuous Blood Gluc Sensor (FREESTYLE LIBRE 14 DAY SENSOR) MISC Inject 1 each into the skin every 14 (fourteen) days. Use as directed. 2 each 2  . dapagliflozin propanediol (FARXIGA) 5 MG TABS tablet Take 5 mg by mouth daily.    Marland Kitchen dicyclomine (BENTYL) 10 MG capsule Take 1 capsule (10 mg total) by mouth 2 (two) times daily before a meal. 60 capsule 5  . doxycycline (VIBRAMYCIN) 100 MG capsule Take 1  capsule (100 mg total) by mouth 2 (two) times daily. 20 capsule 0  . Exenatide ER (BYDUREON BCISE) 2 MG/0.85ML AUIJ Inject 2 mg into the skin every Monday.    . fluticasone (FLONASE) 50 MCG/ACT nasal spray Place 2 sprays into both nostrils daily. (Patient taking differently: Place 2 sprays into both nostrils daily as needed for allergies.) 16 g 0  . fluticasone-salmeterol (ADVAIR HFA) 115-21 MCG/ACT inhaler Inhale 2 puffs into the lungs 2 (two) times daily. 1 Inhaler 5  . gabapentin (NEURONTIN) 300 MG capsule TAKE ONE (1) CAPSULE BY MOUTH 4 TIMES DAILY (Patient taking differently: Take 300 mg by mouth 2 (two) times daily.) 120 capsule 5  . HUMALOG KWIKPEN 200 UNIT/ML KwikPen Inject 35-40 Units into the skin 3 (three) times daily.    . hyoscyamine (LEVSIN SL) 0.125 MG SL tablet Place 1 tablet (0.125 mg total) under the tongue every 4 (four)  hours as needed. (Patient taking differently: Place 0.125 mg under the tongue every 4 (four) hours as needed for cramping.) 30 tablet 0  . ipratropium (ATROVENT) 0.06 % nasal spray Place 2 sprays into both nostrils in the morning and at bedtime.    . Lancets MISC 1 each by Does not apply route 4 (four) times daily. 150 each 5  . lidocaine (LIDODERM) 5 % Place 1 patch onto the skin daily. Place 1 patch to your are of most significant pain on your neck once per day as needed. Remove & Discard patch within 12 hours (Patient taking differently: Place 1 patch onto the skin daily as needed (pain). Place 1 patch to your are of most significant pain on your neck once per day as needed. Remove & Discard patch within 12 hours) 30 patch 0  . losartan (COZAAR) 25 MG tablet TAKE (1) TABLET BY MOUTH ONCE DAILY. (Patient taking differently: Take 25 mg by mouth daily.) 90 tablet 3  . metFORMIN (GLUCOPHAGE) 500 MG tablet TAKE 2 TABLETS BY MOUTH TWICE DAILY WITH A MEAL. (Patient taking differently: Take 1,000 mg by mouth 2 (two) times daily with a meal.) 120 tablet 0  . methocarbamol  (ROBAXIN) 500 MG tablet Take 500 mg by mouth every 8 (eight) hours as needed for muscle spasms.    . nitroGLYCERIN (NITROSTAT) 0.4 MG SL tablet Place 1 tablet (0.4 mg total) under the tongue every 5 (five) minutes as needed. (Patient taking differently: Place 0.4 mg under the tongue every 5 (five) minutes as needed for chest pain.) 25 tablet 3  . ondansetron (ZOFRAN) 4 MG tablet Take 4 mg by mouth every 8 (eight) hours as needed for nausea or vomiting.    . pantoprazole (PROTONIX) 40 MG tablet TAKE ONE TABLET BY MOUTH TWICE A DAY BEFORE A MEAL. 60 tablet 3  . ranolazine (RANEXA) 1000 MG SR tablet Take 1 tablet (1,000 mg total) by mouth 2 (two) times daily. 180 tablet 1  . sertraline (ZOLOFT) 100 MG tablet Take 1.5 tablets (150 mg total) by mouth daily. 135 tablet 2  . TOUJEO MAX SOLOSTAR 300 UNIT/ML Solostar Pen INJECT 130 UNITS INTO THE SKIN AT BEDTIME. (Patient taking differently: Inject 60 Units into the skin at bedtime.) 12 mL 0  . traZODone (DESYREL) 50 MG tablet Take 1 tablet (50 mg total) by mouth at bedtime as needed. (Patient taking differently: Take 50 mg by mouth at bedtime as needed for sleep.) 30 tablet 2   No current facility-administered medications for this visit.   Allergies:  Patient has no known allergies.   ROS: No palpitations or syncope.  Physical Exam: VS:  BP (!) 138/92   Pulse 85   Ht 6' 2"  (1.88 m)   Wt 268 lb 6.4 oz (121.7 kg)   SpO2 96%   BMI 34.46 kg/m , BMI Body mass index is 34.46 kg/m.  Wt Readings from Last 3 Encounters:  02/14/21 268 lb 6.4 oz (121.7 kg)  12/12/20 267 lb (121.1 kg)  12/07/20 264 lb 6.4 oz (119.9 kg)    General: Patient appears comfortable at rest. HEENT: Conjunctiva and lids normal, wearing a mask. Neck: Supple, no elevated JVP or carotid bruits, no thyromegaly. Lungs: Clear to auscultation, nonlabored breathing at rest. Cardiac: Regular rate and rhythm, no S3 or significant systolic murmur, no pericardial rub. Extremities: No  pitting edema.  ECG:  An ECG dated 08/05/2020 was personally reviewed today and demonstrated:  Sinus rhythm.  Recent Labwork: 08/17/2020: ALT 56;  AST 42; Hemoglobin 15.1; Platelets 211 11/07/2020: BUN 17; Creatinine, Ser 1.07; Potassium 4.0; Sodium 136     Component Value Date/Time   CHOL 116 10/18/2018 0903   TRIG 173 (H) 10/18/2018 0903   HDL 30 (L) 10/18/2018 0903   CHOLHDL 3.9 10/18/2018 0903   CHOLHDL 6.4 07/12/2018 0713   VLDL UNABLE TO CALCULATE IF TRIGLYCERIDE OVER 400 mg/dL 07/12/2018 0713   LDLCALC 51 10/18/2018 0903    Other Studies Reviewed Today:  Echocardiogram 08/09/2020: 1. Left ventricular ejection fraction, by estimation, is 60 to 65%. The  left ventricle has normal function. The left ventricle has no regional  wall motion abnormalities. There is mild left ventricular hypertrophy.  Left ventricular diastolic parameters  were normal.  2. Right ventricular systolic function is normal. The right ventricular  size is normal.  3. Left atrial size was mildly dilated.  4. The mitral valve is normal in structure. Trivial mitral valve  regurgitation. No evidence of mitral stenosis.  5. The aortic valve is normal in structure. Aortic valve regurgitation is  not visualized. No aortic stenosis is present.  6. The inferior vena cava is normal in size with greater than 50%  respiratory variability, suggesting right atrial pressure of 3 mmHg.   Assessment and Plan:  1.  CAD status post DES to the LAD in 2015 with mild residual atherosclerosis and patent stent site as of 2019.  Follow-up ECG today is normal.  Continue aspirin, Lipitor, Zebeta, Farxiga, Cozaar, Ranexa, and as needed nitroglycerin.  2.  Mixed hyperlipidemia, has done well on Lipitor with last LDL in the 50s.  Keep follow-up with PCP.  3.  Recurring reflux.  He follows with gastroenterology and is on antireflux measures including Protonix and Carafate.  Medication Adjustments/Labs and Tests  Ordered: Current medicines are reviewed at length with the patient today.  Concerns regarding medicines are outlined above.   Tests Ordered: Orders Placed This Encounter  Procedures  . EKG 12-Lead    Medication Changes: No orders of the defined types were placed in this encounter.   Disposition:  Follow up 6 months.  Signed, Satira Sark, MD, [facility] 02/14/2021 8:38 AM    [facility] [place], [place], [place] [address] Phone: [phone]; Fax: [phone]

## 2021-02-14 ENCOUNTER — Encounter (HOSPITAL_COMMUNITY): Payer: Medicare Other | Admitting: Physical Therapy

## 2021-02-14 ENCOUNTER — Telehealth (INDEPENDENT_AMBULATORY_CARE_PROVIDER_SITE_OTHER): Payer: Self-pay

## 2021-02-14 ENCOUNTER — Ambulatory Visit: Payer: Medicare Other | Admitting: Cardiology

## 2021-02-14 ENCOUNTER — Encounter: Payer: Self-pay | Admitting: Cardiology

## 2021-02-14 VITALS — BP 138/92 | HR 85 | Ht 74.0 in | Wt 268.4 lb

## 2021-02-14 DIAGNOSIS — I25119 Atherosclerotic heart disease of native coronary artery with unspecified angina pectoris: Secondary | ICD-10-CM

## 2021-02-14 DIAGNOSIS — E782 Mixed hyperlipidemia: Secondary | ICD-10-CM | POA: Diagnosis not present

## 2021-02-14 NOTE — Patient Instructions (Signed)
Your physician recommends that you schedule a follow-up appointment in: [place]  Your physician recommends that you continue on your current medications as directed. Please refer to the Current Medication list given to you today.  Thank you for choosing [facility]!!

## 2021-02-14 NOTE — Telephone Encounter (Addendum)
Patient called back today to report very painful in midchest when he swallows.  Patient called this am stating he is having issues with his Jerrye Bushy. He state the the reflux/indgestion is worsening he takes pantoprazole 40 mg BID and dicyclomine 10 mg bid and Tums prn. He states none of this is of any help currently. He says he thought at first this was cardiology related, but saw his cardiologist this am and that was negative for any issues there and they recommended he call here for a resolution. Please advise.

## 2021-02-15 NOTE — Telephone Encounter (Signed)
Spoke with the patient today regarding his recurrent heartburn episodes for the last couple days which actually worsened to the point that he is having odynophagia today with the intake of any solid food.  Denies any symptoms with maintaining liquids.  It will be better to evaluate this further with an EGD.  Darius Bump,  can you please schedule an EGD for him this Friday 6/3?  I do not think he needs any labs prior to this.  It would be room 3, diagnosis odynophagia and heartburn  Thanks,  [name], MD Gastroenterology and Hepatology [facility]

## 2021-02-16 ENCOUNTER — Encounter (HOSPITAL_COMMUNITY): Payer: Medicare Other | Admitting: Physical Therapy

## 2021-02-16 ENCOUNTER — Encounter (HOSPITAL_COMMUNITY): Payer: Self-pay

## 2021-02-16 ENCOUNTER — Encounter (HOSPITAL_COMMUNITY)
Admission: RE | Admit: 2021-02-16 | Discharge: 2021-02-16 | Disposition: A | Discharge status: Home / Self Care | Payer: Medicare Other | Source: Ambulatory Visit | Attending: Gastroenterology | Admitting: Gastroenterology

## 2021-02-16 ENCOUNTER — Other Ambulatory Visit (INDEPENDENT_AMBULATORY_CARE_PROVIDER_SITE_OTHER): Payer: Self-pay

## 2021-02-16 ENCOUNTER — Other Ambulatory Visit: Payer: Self-pay

## 2021-02-16 DIAGNOSIS — R12 Heartburn: Secondary | ICD-10-CM

## 2021-02-16 DIAGNOSIS — R1310 Dysphagia, unspecified: Secondary | ICD-10-CM

## 2021-02-16 DIAGNOSIS — Z01812 Encounter for preprocedural laboratory examination: Secondary | ICD-10-CM | POA: Insufficient documentation

## 2021-02-16 HISTORY — DX: Gastro-esophageal reflux disease without esophagitis: K21.9

## 2021-02-16 LAB — BASIC METABOLIC PANEL
Anion gap: 12 (ref 5–15)
BUN: 17 mg/dL (ref 6–20)
CO2: 23 mmol/L (ref 22–32)
Calcium: 10 mg/dL (ref 8.9–10.3)
Chloride: 102 mmol/L (ref 98–111)
Creatinine, Ser: 0.88 mg/dL (ref 0.61–1.24)
GFR, Estimated: >60 mL/min (ref >60)
Glucose, Bld: 215 mg/dL — ABNORMAL HIGH (ref 70–99)
Potassium: 4.2 mmol/L (ref 3.5–5.1)
Sodium: 137 mmol/L (ref 135–145)

## 2021-02-16 NOTE — Telephone Encounter (Signed)
Thanks

## 2021-02-16 NOTE — Telephone Encounter (Signed)
Mr Manard is on for tomorrow in Rm 3 and he is aware

## 2021-02-16 NOTE — Patient Instructions (Signed)
[name]  02/16/2021     @PREFPERIOPPHARMACY @   Your procedure is scheduled on  02/17/2021.   Report to [name] at  [phone]  A.M.   Call this number if you have problems the morning of surgery:  [phone]   Remember:  Follow the diet and prep instructions given to you by the office.                      Take these medicines the morning of surgery with A SIP OF WATER   Xanax(if needed), bisoprolol, zyrtec, gabapentin, robaxin, zofran (if needed), protonix, ranexa, zoloft.   Take 30 units of toujeo the night before your procedure and DO NOT take any medications for diabetes the morning of your procedure.   Use your inhaler before you come and bring your rescue inhaler with you.    Please brush your teeth.  Do not wear jewelry, make-up or nail polish.  Do not wear lotions, powders, or perfumes, or deodorant.  Do not shave 48 hours prior to surgery.  Men may shave face and neck.  Do not bring valuables to the hospital.  [facility] is not responsible for any belongings or valuables.  Contacts, dentures or bridgework may not be worn into surgery.  Leave your suitcase in the car.  After surgery it may be brought to your room.  For patients admitted to the hospital, discharge time will be determined by your treatment team.  Patients discharged the day of surgery will not be allowed to drive home and must have someone with them for 24 hours.    Special instructions:  DO NOT smoke tobacco or vape for 24 hours before your procedure.  Please read over the following fact sheets that you were given. Anesthesia Post-op Instructions and Care and Recovery After Surgery       Upper Endoscopy, Adult, Care After This sheet gives you information about how to care for yourself after your procedure. Your health care provider may also give you more specific instructions. If you have problems or questions, contact your health care provider. What can I expect after the  procedure? After the procedure, it is common to have:  A sore throat.  Mild stomach pain or discomfort.  Bloating.  Nausea. Follow these instructions at home:  Follow instructions from your health care provider about what to eat or drink after your procedure.  Return to your normal activities as told by your health care provider. Ask your health care provider what activities are safe for you.  Take over-the-counter and prescription medicines only as told by your health care provider.  If you were given a sedative during the procedure, it can affect you for several hours. Do not drive or operate machinery until your health care provider says that it is safe.  Keep all follow-up visits as told by your health care provider. This is important.   Contact a health care provider if you have:  A sore throat that lasts longer than one day.  Trouble swallowing. Get help right away if:  You vomit blood or your vomit looks like coffee grounds.  You have: ? A fever. ? Bloody, black, or tarry stools. ? A severe sore throat or you cannot swallow. ? Difficulty breathing. ? Severe pain in your chest or abdomen. Summary  After the procedure, it is common to have a sore throat, mild stomach discomfort, bloating, and nausea.  If you  were given a sedative during the procedure, it can affect you for several hours. Do not drive or operate machinery until your health care provider says that it is safe.  Follow instructions from your health care provider about what to eat or drink after your procedure.  Return to your normal activities as told by your health care provider. This information is not intended to replace advice given to you by your health care provider. Make sure you discuss any questions you have with your health care provider. Document Revised: 09/01/2019 Document Reviewed: 02/03/2018 Elsevier Patient Education  2021 [employer] After This  sheet gives you information about how to care for yourself after your procedure. Your health care provider may also give you more specific instructions. If you have problems or questions, contact your health care provider. What can I expect after the procedure? After the procedure, it is common to have:  Tiredness.  Forgetfulness about what happened after the procedure.  Impaired judgment for important decisions.  Nausea or vomiting.  Some difficulty with balance. Follow these instructions at home: For the time period you were told by your health care provider:  Rest as needed.  Do not participate in activities where you could fall or become injured.  Do not drive or use machinery.  Do not drink alcohol.  Do not take sleeping pills or medicines that cause drowsiness.  Do not make important decisions or sign legal documents.  Do not take care of children on your own.      Eating and drinking  Follow the diet that is recommended by your health care provider.  Drink enough fluid to keep your urine pale yellow.  If you vomit: ? Drink water, juice, or soup when you can drink without vomiting. ? Make sure you have little or no nausea before eating solid foods. General instructions  Have a responsible adult stay with you for the time you are told. It is important to have someone help care for you until you are awake and alert.  Take over-the-counter and prescription medicines only as told by your health care provider.  If you have sleep apnea, surgery and certain medicines can increase your risk for breathing problems. Follow instructions from your health care provider about wearing your sleep device: ? Anytime you are sleeping, including during daytime naps. ? While taking prescription pain medicines, sleeping medicines, or medicines that make you drowsy.  Avoid smoking.  Keep all follow-up visits as told by your health care provider. This is important. Contact a health  care provider if:  You keep feeling nauseous or you keep vomiting.  You feel light-headed.  You are still sleepy or having trouble with balance after 24 hours.  You develop a rash.  You have a fever.  You have redness or swelling around the IV site. Get help right away if:  You have trouble breathing.  You have new-onset confusion at home. Summary  For several hours after your procedure, you may feel tired. You may also be forgetful and have poor judgment.  Have a responsible adult stay with you for the time you are told. It is important to have someone help care for you until you are awake and alert.  Rest as told. Do not drive or operate machinery. Do not drink alcohol or take sleeping pills.  Get help right away if you have trouble breathing, or if you suddenly become confused. This information is not intended to replace advice given  to you by your health care provider. Make sure you discuss any questions you have with your health care provider. Document Revised: 05/19/2020 Document Reviewed: 08/06/2019 Elsevier Patient Education  2021 [employer].

## 2021-02-17 ENCOUNTER — Ambulatory Visit (HOSPITAL_COMMUNITY): Payer: Medicare Other | Admitting: Anesthesiology

## 2021-02-17 ENCOUNTER — Telehealth (INDEPENDENT_AMBULATORY_CARE_PROVIDER_SITE_OTHER): Payer: Self-pay | Admitting: *Deleted

## 2021-02-17 ENCOUNTER — Encounter (HOSPITAL_COMMUNITY)
Admission: RE | Disposition: A | Discharge status: Home / Self Care | Payer: Self-pay | Source: Home / Self Care | Attending: Gastroenterology

## 2021-02-17 ENCOUNTER — Encounter (HOSPITAL_COMMUNITY): Payer: Self-pay | Admitting: Gastroenterology

## 2021-02-17 ENCOUNTER — Ambulatory Visit (HOSPITAL_COMMUNITY)
Admission: RE | Admit: 2021-02-17 | Discharge: 2021-02-17 | Disposition: A | Discharge status: Home / Self Care | Payer: Medicare Other | Attending: Gastroenterology | Admitting: Gastroenterology

## 2021-02-17 DIAGNOSIS — Z7982 Long term (current) use of aspirin: Secondary | ICD-10-CM | POA: Diagnosis not present

## 2021-02-17 DIAGNOSIS — Z794 Long term (current) use of insulin: Secondary | ICD-10-CM | POA: Insufficient documentation

## 2021-02-17 DIAGNOSIS — Z823 Family history of stroke: Secondary | ICD-10-CM | POA: Insufficient documentation

## 2021-02-17 DIAGNOSIS — K7581 Nonalcoholic steatohepatitis (NASH): Secondary | ICD-10-CM | POA: Insufficient documentation

## 2021-02-17 DIAGNOSIS — E782 Mixed hyperlipidemia: Secondary | ICD-10-CM | POA: Diagnosis not present

## 2021-02-17 DIAGNOSIS — Z8262 Family history of osteoporosis: Secondary | ICD-10-CM | POA: Diagnosis not present

## 2021-02-17 DIAGNOSIS — I2510 Atherosclerotic heart disease of native coronary artery without angina pectoris: Secondary | ICD-10-CM | POA: Insufficient documentation

## 2021-02-17 DIAGNOSIS — R1310 Dysphagia, unspecified: Secondary | ICD-10-CM | POA: Diagnosis not present

## 2021-02-17 DIAGNOSIS — R12 Heartburn: Secondary | ICD-10-CM

## 2021-02-17 DIAGNOSIS — Z79899 Other long term (current) drug therapy: Secondary | ICD-10-CM | POA: Diagnosis not present

## 2021-02-17 DIAGNOSIS — E1140 Type 2 diabetes mellitus with diabetic neuropathy, unspecified: Secondary | ICD-10-CM | POA: Insufficient documentation

## 2021-02-17 DIAGNOSIS — I25119 Atherosclerotic heart disease of native coronary artery with unspecified angina pectoris: Secondary | ICD-10-CM | POA: Diagnosis not present

## 2021-02-17 DIAGNOSIS — Z809 Family history of malignant neoplasm, unspecified: Secondary | ICD-10-CM | POA: Diagnosis not present

## 2021-02-17 DIAGNOSIS — Z7951 Long term (current) use of inhaled steroids: Secondary | ICD-10-CM | POA: Diagnosis not present

## 2021-02-17 DIAGNOSIS — K2289 Other specified disease of esophagus: Secondary | ICD-10-CM | POA: Insufficient documentation

## 2021-02-17 DIAGNOSIS — Z955 Presence of coronary angioplasty implant and graft: Secondary | ICD-10-CM | POA: Insufficient documentation

## 2021-02-17 DIAGNOSIS — J449 Chronic obstructive pulmonary disease, unspecified: Secondary | ICD-10-CM | POA: Diagnosis not present

## 2021-02-17 DIAGNOSIS — Z87891 Personal history of nicotine dependence: Secondary | ICD-10-CM | POA: Diagnosis not present

## 2021-02-17 DIAGNOSIS — K580 Irritable bowel syndrome with diarrhea: Secondary | ICD-10-CM | POA: Insufficient documentation

## 2021-02-17 HISTORY — PX: ESOPHAGOGASTRODUODENOSCOPY (EGD) WITH PROPOFOL: SHX5813

## 2021-02-17 LAB — GLUCOSE, CAPILLARY: Glucose-Capillary: 182 mg/dL — ABNORMAL HIGH (ref 70–99)

## 2021-02-17 SURGERY — ESOPHAGOGASTRODUODENOSCOPY (EGD) WITH PROPOFOL
Anesthesia: General

## 2021-02-17 MED ORDER — STERILE WATER FOR IRRIGATION IR SOLN
Status: DC | PRN
  Administered 2021-02-17: 1.5 mL

## 2021-02-17 MED ORDER — LACTATED RINGERS IV SOLN: INTRAVENOUS | Status: DC

## 2021-02-17 MED ORDER — OMEPRAZOLE 40 MG PO CPDR: 40.0000 mg | DELAYED_RELEASE_CAPSULE | Freq: Two times a day (BID) | ORAL | 3 refills | Status: DC

## 2021-02-17 MED ORDER — PROPOFOL 10 MG/ML IV BOLUS
INTRAVENOUS | Status: DC | PRN
  Administered 2021-02-17: 50 mg via INTRAVENOUS
  Administered 2021-02-17: 100 mg via INTRAVENOUS
  Administered 2021-02-17: 50 mg via INTRAVENOUS

## 2021-02-17 MED ORDER — LIDOCAINE HCL (CARDIAC) PF 100 MG/5ML IV SOSY
PREFILLED_SYRINGE | INTRAVENOUS | Status: DC | PRN
  Administered 2021-02-17: 30 mg via INTRAVENOUS

## 2021-02-17 MED ORDER — SUCRALFATE 1 GM/10ML PO SUSP: 1.0000 g | Freq: Four times a day (QID) | ORAL | 0 refills | Status: DC

## 2021-02-17 NOTE — H&P (Signed)
Jared Griffin is an 60 y.o. male.   Chief Complaint: Odynophagia HPI: Jared Griffin is a 60 y.o. male with has medical history COPD, CAD s/p stent, diabetes c/b neuropathy , HLD, NASH and IBS-D, who presents for evaluation of odynophagia.  The patient states that for the last 3 days he has presented significant odynophagia when swallowing any type of food which has been increasing his food intake.  He has also noticed that he has presented with recurrent heartburn for the last few weeks despite taking compliantly his pantoprazole 40 mg every 12 hours.  Denies having any dysphagia.  States his symptoms have worsened significantly and have led to significant distress.  Past Medical History:  Diagnosis Date  . Anxiety   . COPD (chronic obstructive pulmonary disease) ([place])   . Coronary atherosclerosis of native coronary artery    a. 12/09/2013: s/p PCI with 3.0 x 28 Promus DES to mLAD  . Depression   . Diabetic neuropathy ([place])   . Elbow injury 07/25/2017   Surgery for torn tendon  . Essential hypertension   . Fatty liver disease, nonalcoholic   . GERD (gastroesophageal reflux disease)   . Lateral epicondylitis of right elbow   . Mixed hyperlipidemia   . Obesity   . Osgood-Schlatter's disease of right knee   . Skin cyst 10/03/2017   Removed from back  . Type 2 diabetes mellitus ([place])     Past Surgical History:  Procedure Laterality Date  . BIOPSY  01/21/2019   Procedure: BIOPSY;  Surgeon: [name], MD;  [facility];  Service: Endoscopy;;  gastric bx and gastric polyps  . BIOPSY  11/08/2020   Procedure: BIOPSY;  Surgeon: [name], MD;  [facility];  Service: Gastroenterology;;  . CARDIAC CATHETERIZATION  2011  . CARDIAC CATHETERIZATION N/A 09/20/2015   Procedure: Left Heart Cath and Coronary Angiography;  Surgeon: [name], MD;  Location: [facility];  Service: Cardiovascular;  Laterality: N/A;  .  CHOLECYSTECTOMY  2003  . COLONOSCOPY  06/19/2012   Procedure: COLONOSCOPY;  Surgeon: [name], MD;  [facility];  Service: Endoscopy;  Laterality: N/A;  930  . COLONOSCOPY N/A 10/17/2017   Procedure: COLONOSCOPY;  Surgeon: [name], MD;  [facility];  Service: Endoscopy;  Laterality: N/A;  1225  . COLONOSCOPY WITH PROPOFOL N/A 11/08/2020   Procedure: COLONOSCOPY WITH PROPOFOL;  Surgeon: [name], MD;  [facility];  Service: Gastroenterology;  Laterality: N/A;  10:45  . CYST EXCISION  07/2019  . ESOPHAGOGASTRODUODENOSCOPY (EGD) WITH PROPOFOL N/A 01/21/2019   Procedure: ESOPHAGOGASTRODUODENOSCOPY (EGD) WITH PROPOFOL;  Surgeon: [name], MD;  [facility];  Service: Endoscopy;  Laterality: N/A;  10:15am  . KNEE ARTHROPLASTY Right 1999  . LEFT HEART CATH AND CORONARY ANGIOGRAPHY N/A 02/26/2018   Procedure: LEFT HEART CATH AND CORONARY ANGIOGRAPHY;  Surgeon: [name], MD;  Location: [facility];  Service: Cardiovascular;  Laterality: N/A;  . LEFT HEART CATHETERIZATION WITH CORONARY ANGIOGRAM N/A 12/09/2013   Procedure: LEFT HEART CATHETERIZATION WITH CORONARY ANGIOGRAM;  Surgeon: [name], MD;  Location: [facility];  Service: Cardiovascular;  Laterality: N/A;  . Liver biopsy    . POLYPECTOMY  10/17/2017   Procedure: POLYPECTOMY;  Surgeon: [name], MD;  [facility];  Service: Endoscopy;;  ascending colon, splenic flexure,sigmoid x2  . POLYPECTOMY  11/08/2020   Procedure:  POLYPECTOMY;  Surgeon: Montez Morita, Quillian Quince, MD;  [facility];  Service: Gastroenterology;;  . Joanna Puff ELBOW RELEASE/NIRSCHEL PROCEDURE Right 07/25/2017   Procedure: TENNIS ELBOW RELEASE and debriedment;  Surgeon: [name], MD;  Location: [facility];  Service: Orthopedics;  Laterality: Right;  . TONSILLECTOMY  1970's    Family History  Problem Relation Age of Onset  .  Osteoporosis Mother   . Cancer Maternal Aunt   . CVA Maternal Grandmother   . CVA Maternal Grandfather   . Healthy Son    Social History:  reports that he quit smoking about 37 years ago. His smoking use included cigarettes. He started smoking about 48 years ago. He has a 5.00 pack-year smoking history. He quit smokeless tobacco use about 37 years ago.  His smokeless tobacco use included chew. He reports that he does not drink alcohol and does not use drugs.  Allergies: No Known Allergies  Medications Prior to Admission  Medication Sig Dispense Refill  . acetaminophen (TYLENOL) 500 MG tablet Take 1,500 mg by mouth every 8 (eight) hours as needed for moderate pain.    Marland Kitchen albuterol (VENTOLIN HFA) 108 (90 Base) MCG/ACT inhaler INHALE 2 PUFFS INTO LUNGS EVERY 6 HOURS AS NEEDED FOR WHEEZING AND SHORTNESS OF BREATH. (Patient taking differently: Inhale 2 puffs into the lungs every 6 (six) hours as needed for shortness of breath or wheezing. INHALE 2 PUFFS INTO LUNGS EVERY 6 HOURS AS NEEDED FOR WHEEZING AND SHORTNESS OF BREATH.) 8.5 g 5  . ALPRAZolam (XANAX) 1 MG tablet Take 1 tablet (1 mg total) by mouth 3 (three) times daily as needed for anxiety. 90 tablet 2  . aspirin 81 MG EC tablet Take 1 tablet (81 mg total) by mouth daily. 30 tablet 12  . atorvastatin (LIPITOR) 40 MG tablet Take 1 tablet (40 mg total) by mouth daily. 90 tablet 1  . bisoprolol (ZEBETA) 5 MG tablet Take 1 tablet (5 mg total) by mouth daily. 90 tablet 2  . Continuous Blood Gluc Sensor (FREESTYLE LIBRE 14 DAY SENSOR) MISC Inject 1 each into the skin every 14 (fourteen) days. Use as directed. 2 each 2  . dapagliflozin propanediol (FARXIGA) 5 MG TABS tablet Take 5 mg by mouth daily.    Marland Kitchen dicyclomine (BENTYL) 10 MG capsule Take 1 capsule (10 mg total) by mouth 2 (two) times daily before a meal. 60 capsule 5  . doxycycline (VIBRAMYCIN) 100 MG capsule Take 1 capsule (100 mg total) by mouth 2 (two) times daily. 20 capsule 0  . fluticasone  (FLONASE) 50 MCG/ACT nasal spray Place 2 sprays into both nostrils daily. (Patient taking differently: Place 2 sprays into both nostrils daily as needed for allergies.) 16 g 0  . fluticasone-salmeterol (ADVAIR HFA) 115-21 MCG/ACT inhaler Inhale 2 puffs into the lungs 2 (two) times daily. 1 Inhaler 5  . gabapentin (NEURONTIN) 300 MG capsule TAKE ONE (1) CAPSULE BY MOUTH 4 TIMES DAILY (Patient taking differently: Take 300 mg by mouth 2 (two) times daily.) 120 capsule 5  . HUMALOG KWIKPEN 200 UNIT/ML KwikPen Inject 35-40 Units into the skin 3 (three) times daily.    . hyoscyamine (LEVSIN SL) 0.125 MG SL tablet Place 1 tablet (0.125 mg total) under the tongue every 4 (four) hours as needed. (Patient taking differently: Place 0.125 mg under the tongue every 4 (four) hours as needed for cramping.) 30 tablet 0  . ipratropium (ATROVENT) 0.06 % nasal spray Place 2 sprays into both nostrils in the morning and at  bedtime.    Marland Kitchen losartan (COZAAR) 25 MG tablet TAKE (1) TABLET BY MOUTH ONCE DAILY. (Patient taking differently: Take 25 mg by mouth daily.) 90 tablet 3  . metFORMIN (GLUCOPHAGE) 500 MG tablet TAKE 2 TABLETS BY MOUTH TWICE DAILY WITH A MEAL. (Patient taking differently: Take 1,000 mg by mouth 2 (two) times daily with a meal.) 120 tablet 0  . methocarbamol (ROBAXIN) 500 MG tablet Take 500 mg by mouth every 8 (eight) hours as needed for muscle spasms.    . ondansetron (ZOFRAN) 4 MG tablet Take 4 mg by mouth every 8 (eight) hours as needed for nausea or vomiting.    . pantoprazole (PROTONIX) 40 MG tablet TAKE ONE TABLET BY MOUTH TWICE A DAY BEFORE A MEAL. 60 tablet 3  . ranolazine (RANEXA) 1000 MG SR tablet Take 1 tablet (1,000 mg total) by mouth 2 (two) times daily. 180 tablet 1  . sertraline (ZOLOFT) 100 MG tablet Take 1.5 tablets (150 mg total) by mouth daily. 135 tablet 2  . B-D UF III MINI PEN NEEDLES 31G X 5 MM MISC USE UP TO 4 TIMES DAILY WITH INSULIN AS DIRECTED. 150 each 5  . benzonatate (TESSALON)  100 MG capsule Take 1 capsule (100 mg total) by mouth every 8 (eight) hours. 21 capsule 0  . cetirizine (ZYRTEC) 10 MG tablet Take 1 tablet (10 mg total) by mouth daily. (Patient taking differently: Take 10 mg by mouth daily as needed for allergies.) 30 tablet 0  . Exenatide ER (BYDUREON BCISE) 2 MG/0.85ML AUIJ Inject 2 mg into the skin every Monday.    . Lancets MISC 1 each by Does not apply route 4 (four) times daily. 150 each 5  . lidocaine (LIDODERM) 5 % Place 1 patch onto the skin daily. Place 1 patch to your are of most significant pain on your neck once per day as needed. Remove & Discard patch within 12 hours (Patient taking differently: Place 1 patch onto the skin daily as needed (pain). Place 1 patch to your are of most significant pain on your neck once per day as needed. Remove & Discard patch within 12 hours) 30 patch 0  . nitroGLYCERIN (NITROSTAT) 0.4 MG SL tablet Place 1 tablet (0.4 mg total) under the tongue every 5 (five) minutes as needed. (Patient taking differently: Place 0.4 mg under the tongue every 5 (five) minutes as needed for chest pain.) 25 tablet 3  . TOUJEO MAX SOLOSTAR 300 UNIT/ML Solostar Pen INJECT 130 UNITS INTO THE SKIN AT BEDTIME. (Patient taking differently: Inject 60 Units into the skin at bedtime.) 12 mL 0  . traZODone (DESYREL) 50 MG tablet Take 1 tablet (50 mg total) by mouth at bedtime as needed. (Patient taking differently: Take 50 mg by mouth at bedtime as needed for sleep.) 30 tablet 2    Results for orders placed or performed during the hospital encounter of 02/17/21 (from the past 48 hour(s))  Glucose, capillary     Status: Abnormal   Collection Time: 02/17/21  8:14 AM  Result Value Ref Range   Glucose-Capillary 182 (H) 70 - 99 mg/dL    Comment: Glucose reference range applies only to samples taken after fasting for at least 8 hours.   No results found.  Review of Systems  Constitutional: Negative.   HENT: Positive for trouble swallowing.   Eyes:  Negative.   Respiratory: Negative.   Cardiovascular: Negative.   Gastrointestinal: Negative.   Endocrine: Negative.   Genitourinary: Negative.   Musculoskeletal: Negative.  Skin: Negative.   Allergic/Immunologic: Negative.   Neurological: Negative.   Hematological: Negative.   Psychiatric/Behavioral: Negative.     Blood pressure 138/87, pulse 89, temperature 98.1 F (36.7 C), temperature source Oral, resp. rate 19, SpO2 96 %. Physical Exam  GENERAL: The patient is AO x3, in no acute distress. HEENT: Head is normocephalic and atraumatic. EOMI are intact. Mouth is well hydrated and without lesions. NECK: Supple. No masses LUNGS: Clear to auscultation. No presence of rhonchi/wheezing/rales. Adequate chest expansion HEART: RRR, normal s1 and s2. ABDOMEN: Soft, nontender, no guarding, no peritoneal signs, and nondistended. BS +. No masses. EXTREMITIES: Without any cyanosis, clubbing, rash, lesions or edema. NEUROLOGIC: AOx3, no focal motor deficit. SKIN: no jaundice, no rashes  Assessment/Plan Jared Griffin is a 60 y.o. male with has medical history COPD, CAD s/p stent, diabetes c/b neuropathy , HLD, NASH and IBS-D, who presents for evaluation of odynophagia.  We will proceed with EGD. [name], MD 02/17/2021, 9:00 AM

## 2021-02-17 NOTE — Op Note (Addendum)
[facility] Patient Name: Jared Griffin Procedure Date: 02/17/2021 8:42 AM MRN: [id number] Date of Birth: 1960-10-10 Attending MD: [name] ,  CSN: [id number] Age: 60 Admit Type: Outpatient Procedure:                Upper GI endoscopy Indications:              Odynophagia Providers:                [name], Charlsie Quest. Theda Sers RN, RN, Nelma Rothman, Technician Referring MD:              Medicines:                Monitored Anesthesia Care Complications:            No immediate complications. Estimated Blood Loss:     Estimated blood loss: none. Procedure:                Pre-Anesthesia Assessment:                           - Prior to the procedure, a History and Physical                            was performed, and patient medications, allergies                            and sensitivities were reviewed. The patient's                            tolerance of previous anesthesia was reviewed.                           - The risks and benefits of the procedure and the                            sedation options and risks were discussed with the                            patient. All questions were answered and informed                            consent was obtained.                           - ASA Grade Assessment: II - A patient with mild                            systemic disease.                           After obtaining informed consent, the endoscope was                            passed under direct vision. Throughout the  procedure, the patient's blood pressure, pulse, and                            oxygen saturations were monitored continuously. The                            GIF-H190 ([id number]) scope was introduced through the                            mouth, and advanced to the second part of duodenum.                            The upper GI endoscopy was accomplished without                             difficulty. The patient tolerated the procedure                            well. Scope In: 9:12:46 AM Scope Out: 9:20:35 AM Total Procedure Duration: 0 hours 7 minutes 49 seconds  Findings:      A medium healed ulcer was found in the upper third of the esophagus.       There was a coating overlying this area, which was washed - scant       spontaneous oozing was observed upon reinspection of this area. The       bleeding was self limited.      The entire examined stomach was normal.      The examined duodenum was normal. Impression:               - Scar in the upper third of the esophagus. ?Pill                            esophagitis (patient on doxycycline)                           - Normal stomach.                           - Normal examined duodenum.                           - No specimens collected. Moderate Sedation:      Per Anesthesia Care Recommendation:           - Discharge patient to home (ambulatory).                           - Resume previous diet.                           - Repeat upper endoscopy in 6 weeks for                            surveillance.                           - Switch  omeprazole 40 mg twice a day.                           - Start sucralfate 1 g every 6 hours for 4 weeks.                           - Consider switching doxycycline to other                            antibiotic. Procedure Code(s):        --- Professional ---                           [phone], Esophagogastroduodenoscopy, flexible,                            transoral; diagnostic, including collection of                            specimen(s) by brushing or washing, when performed                            (separate procedure) Diagnosis Code(s):        --- Professional ---                           K22.8, Other specified diseases of esophagus                           R13.10, Dysphagia, unspecified CPT copyright 2019 American Medical Association. All rights reserved. The codes  documented in this report are preliminary and upon coder review may  be revised to meet current compliance requirements. [name], MD [name],  02/17/2021 9:28:57 AM This report has been signed electronically. Number of Addenda: 0

## 2021-02-17 NOTE — Transfer of Care (Signed)
Immediate Anesthesia Transfer of Care Note  Patient: Jared Griffin  Procedure(s) Performed: ESOPHAGOGASTRODUODENOSCOPY (EGD) WITH PROPOFOL (N/A )  Patient Location: PACU  Anesthesia Type:MAC  Level of Consciousness: awake, alert  and oriented  Airway & Oxygen Therapy: Patient Spontanous Breathing  Post-op Assessment: Report given to RN and Post -op Vital signs reviewed and stable  Post vital signs: Reviewed and stable  Last Vitals:  Vitals Value Taken Time  BP 106/65 02/17/2021 0927      Pulse 68 [id number] 0827  Resp 16 [id number]  SpO2 96 [id number] 0827    Last Pain:  Vitals:   02/17/21 0910  TempSrc:   PainSc: 3       Patients Stated Pain Goal: 5 (31/43/88 8757)  Complications: No complications documented.

## 2021-02-17 NOTE — Anesthesia Postprocedure Evaluation (Signed)
Anesthesia Post Note  Patient: Jared Griffin  Procedure(s) Performed: ESOPHAGOGASTRODUODENOSCOPY (EGD) WITH PROPOFOL (N/A )  Patient location during evaluation: Phase II Anesthesia Type: General Level of consciousness: awake Pain management: pain level controlled Vital Signs Assessment: post-procedure vital signs reviewed and stable Respiratory status: spontaneous breathing and respiratory function stable Cardiovascular status: blood pressure returned to baseline and stable Postop Assessment: no headache and no apparent nausea or vomiting Anesthetic complications: no Comments: Late entry   No complications documented.   Last Vitals:  Vitals:   02/17/21 0826 02/17/21 0926  BP: 138/87 106/65  Pulse: 89 82  Resp: 19 16  Temp: 36.7 C 36.5 C  SpO2: 96% 95%    Last Pain:  Vitals:   02/17/21 0926  TempSrc: Oral  PainSc: 0-No pain                 Louann Sjogren

## 2021-02-17 NOTE — Anesthesia Preprocedure Evaluation (Signed)
Anesthesia Evaluation  Patient identified by MRN, date of birth, ID band Patient awake    Reviewed: Allergy & Precautions, H&P , NPO status , Patient's Chart, lab work & pertinent test results, reviewed documented beta blocker date and time   Airway Mallampati: II  TM Distance: >3 FB Neck ROM: full    Dental no notable dental hx. (+) Teeth Intact   Pulmonary asthma , COPD, former smoker,    Pulmonary exam normal breath sounds clear to auscultation       Cardiovascular Exercise Tolerance: Good hypertension, + angina + CAD   Rhythm:regular Rate:Normal     Neuro/Psych  Headaches, PSYCHIATRIC DISORDERS Anxiety Depression  Neuromuscular disease    GI/Hepatic GERD  Medicated,(+) Hepatitis -, Autoimmune  Endo/Other  negative endocrine ROSdiabetes  Renal/GU negative Renal ROS  negative genitourinary   Musculoskeletal   Abdominal   Peds  Hematology negative hematology ROS (+)   Anesthesia Other Findings   Reproductive/Obstetrics negative OB ROS                             Anesthesia Physical Anesthesia Plan  ASA: III  Anesthesia Plan: General   Post-op Pain Management:    Induction:   PONV Risk Score and Plan: Propofol infusion  Airway Management Planned:   Additional Equipment:   Intra-op Plan:   Post-operative Plan:   Informed Consent: I have reviewed the patients History and Physical, chart, labs and discussed the procedure including the risks, benefits and alternatives for the proposed anesthesia with the patient or authorized representative who has indicated his/her understanding and acceptance.     Dental Advisory Given  Plan Discussed with: CRNA  Anesthesia Plan Comments:         Anesthesia Quick Evaluation

## 2021-02-17 NOTE — Telephone Encounter (Signed)
Per op note - patient need repeat EGD in 6 weeks

## 2021-02-17 NOTE — Discharge Instructions (Signed)
   Monitored Anesthesia Care, Care After This sheet gives you information about how to care for yourself after your procedure. Your health care provider may also give you more specific instructions. If you have problems or questions, contact your health care provider. What can I expect after the procedure? After the procedure, it is common to have:  Tiredness.  Forgetfulness about what happened after the procedure.  Impaired judgment for important decisions.  Nausea or vomiting.  Some difficulty with balance. Follow these instructions at home: For the time period you were told by your health care provider:  Rest as needed.  Do not participate in activities where you could fall or become injured.  Do not drive or use machinery.  Do not drink alcohol.  Do not take sleeping pills or medicines that cause drowsiness.  Do not make important decisions or sign legal documents.  Do not take care of children on your own.      Eating and drinking  Follow the diet that is recommended by your health care provider.  Drink enough fluid to keep your urine pale yellow.  If you vomit: ? Drink water, juice, or soup when you can drink without vomiting. ? Make sure you have little or no nausea before eating solid foods. General instructions  Have a responsible adult stay with you for the time you are told. It is important to have someone help care for you until you are awake and alert.  Take over-the-counter and prescription medicines only as told by your health care provider.  If you have sleep apnea, surgery and certain medicines can increase your risk for breathing problems. Follow instructions from your health care provider about wearing your sleep device: ? Anytime you are sleeping, including during daytime naps. ? While taking prescription pain medicines, sleeping medicines, or medicines that make you drowsy.  Avoid smoking.  Keep all follow-up visits as told by your health  care provider. This is important. Contact a health care provider if:  You keep feeling nauseous or you keep vomiting.  You feel light-headed.  You are still sleepy or having trouble with balance after 24 hours.  You develop a rash.  You have a fever.  You have redness or swelling around the IV site. Get help right away if:  You have trouble breathing.  You have new-onset confusion at home. Summary  For several hours after your procedure, you may feel tired. You may also be forgetful and have poor judgment.  Have a responsible adult stay with you for the time you are told. It is important to have someone help care for you until you are awake and alert.  Rest as told. Do not drive or operate machinery. Do not drink alcohol or take sleeping pills.  Get help right away if you have trouble breathing, or if you suddenly become confused. This information is not intended to replace advice given to you by your health care provider. Make sure you discuss any questions you have with your health care provider. Document Revised: 05/19/2020 Document Reviewed: 08/06/2019 Elsevier Patient Education  2021 [employer] are being discharged to home.  Resume your previous diet.  Your physician has recommended a repeat upper endoscopy in six weeks for surveillance.  Switch omeprazole 40 mg twice a day. Start sucralfate 1 g every 6 hours for 4 weeks. Consider switching doxycycline to other antibiotic.

## 2021-02-20 DIAGNOSIS — E1165 Type 2 diabetes mellitus with hyperglycemia: Secondary | ICD-10-CM | POA: Diagnosis not present

## 2021-02-21 DIAGNOSIS — G5790 Unspecified mononeuropathy of unspecified lower limb: Secondary | ICD-10-CM | POA: Diagnosis not present

## 2021-02-21 DIAGNOSIS — M792 Neuralgia and neuritis, unspecified: Secondary | ICD-10-CM | POA: Diagnosis not present

## 2021-02-21 DIAGNOSIS — M79671 Pain in right foot: Secondary | ICD-10-CM | POA: Diagnosis not present

## 2021-02-21 DIAGNOSIS — M79672 Pain in left foot: Secondary | ICD-10-CM | POA: Diagnosis not present

## 2021-02-21 NOTE — Telephone Encounter (Signed)
Noted thank you

## 2021-02-27 ENCOUNTER — Encounter (HOSPITAL_COMMUNITY): Payer: Self-pay | Admitting: Gastroenterology

## 2021-03-02 ENCOUNTER — Other Ambulatory Visit: Payer: Self-pay | Admitting: *Deleted

## 2021-03-02 ENCOUNTER — Telehealth: Payer: Self-pay | Admitting: Family Medicine

## 2021-03-02 DIAGNOSIS — I779 Disorder of arteries and arterioles, unspecified: Secondary | ICD-10-CM

## 2021-03-02 DIAGNOSIS — R42 Dizziness and giddiness: Secondary | ICD-10-CM

## 2021-03-02 NOTE — Telephone Encounter (Signed)
Carotid u/s order put in

## 2021-03-02 NOTE — Telephone Encounter (Signed)
Nurses  We had ordered carotid Doppler studies due to dizziness as well as carotid artery disease but patient never got called by central scheduling on when his test would be.  Please reorder this.  Please make sure that central scheduling calls patient to set up this.  Thank you

## 2021-03-06 ENCOUNTER — Encounter: Payer: Self-pay | Admitting: Physical Medicine and Rehabilitation

## 2021-03-09 ENCOUNTER — Other Ambulatory Visit: Payer: Self-pay | Admitting: "Endocrinology

## 2021-03-10 ENCOUNTER — Other Ambulatory Visit: Payer: Self-pay

## 2021-03-10 ENCOUNTER — Ambulatory Visit (HOSPITAL_COMMUNITY)
Admission: RE | Admit: 2021-03-10 | Discharge: 2021-03-10 | Disposition: A | Discharge status: Home / Self Care | Payer: Medicare Other | Source: Ambulatory Visit | Attending: Family Medicine | Admitting: Family Medicine

## 2021-03-10 DIAGNOSIS — R42 Dizziness and giddiness: Secondary | ICD-10-CM | POA: Diagnosis not present

## 2021-03-10 DIAGNOSIS — E785 Hyperlipidemia, unspecified: Secondary | ICD-10-CM | POA: Diagnosis not present

## 2021-03-10 DIAGNOSIS — E119 Type 2 diabetes mellitus without complications: Secondary | ICD-10-CM | POA: Diagnosis not present

## 2021-03-10 DIAGNOSIS — I779 Disorder of arteries and arterioles, unspecified: Secondary | ICD-10-CM | POA: Diagnosis not present

## 2021-03-10 DIAGNOSIS — I10 Essential (primary) hypertension: Secondary | ICD-10-CM | POA: Diagnosis not present

## 2021-03-14 ENCOUNTER — Telehealth: Payer: Self-pay | Admitting: *Deleted

## 2021-03-14 ENCOUNTER — Emergency Department (HOSPITAL_COMMUNITY): Payer: Medicare Other

## 2021-03-14 ENCOUNTER — Other Ambulatory Visit (INDEPENDENT_AMBULATORY_CARE_PROVIDER_SITE_OTHER): Payer: Self-pay

## 2021-03-14 ENCOUNTER — Encounter (HOSPITAL_COMMUNITY): Payer: Self-pay | Admitting: Emergency Medicine

## 2021-03-14 ENCOUNTER — Other Ambulatory Visit: Payer: Self-pay

## 2021-03-14 ENCOUNTER — Telehealth (INDEPENDENT_AMBULATORY_CARE_PROVIDER_SITE_OTHER): Payer: Self-pay

## 2021-03-14 ENCOUNTER — Encounter (INDEPENDENT_AMBULATORY_CARE_PROVIDER_SITE_OTHER): Payer: Self-pay

## 2021-03-14 ENCOUNTER — Telehealth: Payer: Self-pay | Admitting: Family Medicine

## 2021-03-14 ENCOUNTER — Emergency Department (HOSPITAL_COMMUNITY)
Admission: EM | Admit: 2021-03-14 | Discharge: 2021-03-14 | Disposition: A | Discharge status: Home / Self Care | Payer: Medicare Other | Attending: Emergency Medicine | Admitting: Emergency Medicine

## 2021-03-14 DIAGNOSIS — R079 Chest pain, unspecified: Secondary | ICD-10-CM | POA: Diagnosis not present

## 2021-03-14 DIAGNOSIS — R0789 Other chest pain: Secondary | ICD-10-CM | POA: Diagnosis not present

## 2021-03-14 DIAGNOSIS — R000 Tachycardia, unspecified: Secondary | ICD-10-CM | POA: Diagnosis not present

## 2021-03-14 DIAGNOSIS — J45909 Unspecified asthma, uncomplicated: Secondary | ICD-10-CM | POA: Insufficient documentation

## 2021-03-14 DIAGNOSIS — Z8679 Personal history of other diseases of the circulatory system: Secondary | ICD-10-CM | POA: Diagnosis not present

## 2021-03-14 DIAGNOSIS — Z7951 Long term (current) use of inhaled steroids: Secondary | ICD-10-CM | POA: Insufficient documentation

## 2021-03-14 DIAGNOSIS — Z20822 Contact with and (suspected) exposure to covid-19: Secondary | ICD-10-CM | POA: Diagnosis not present

## 2021-03-14 DIAGNOSIS — Z955 Presence of coronary angioplasty implant and graft: Secondary | ICD-10-CM | POA: Insufficient documentation

## 2021-03-14 DIAGNOSIS — J449 Chronic obstructive pulmonary disease, unspecified: Secondary | ICD-10-CM | POA: Diagnosis not present

## 2021-03-14 DIAGNOSIS — R61 Generalized hyperhidrosis: Secondary | ICD-10-CM | POA: Diagnosis not present

## 2021-03-14 DIAGNOSIS — R0689 Other abnormalities of breathing: Secondary | ICD-10-CM | POA: Diagnosis not present

## 2021-03-14 DIAGNOSIS — I25118 Atherosclerotic heart disease of native coronary artery with other forms of angina pectoris: Secondary | ICD-10-CM | POA: Diagnosis not present

## 2021-03-14 DIAGNOSIS — Z96651 Presence of right artificial knee joint: Secondary | ICD-10-CM | POA: Diagnosis not present

## 2021-03-14 DIAGNOSIS — J342 Deviated nasal septum: Secondary | ICD-10-CM | POA: Diagnosis not present

## 2021-03-14 DIAGNOSIS — R42 Dizziness and giddiness: Secondary | ICD-10-CM | POA: Diagnosis not present

## 2021-03-14 DIAGNOSIS — I10 Essential (primary) hypertension: Secondary | ICD-10-CM | POA: Diagnosis not present

## 2021-03-14 DIAGNOSIS — Z87891 Personal history of nicotine dependence: Secondary | ICD-10-CM | POA: Diagnosis not present

## 2021-03-14 DIAGNOSIS — J341 Cyst and mucocele of nose and nasal sinus: Secondary | ICD-10-CM | POA: Diagnosis not present

## 2021-03-14 DIAGNOSIS — R1084 Generalized abdominal pain: Secondary | ICD-10-CM | POA: Diagnosis not present

## 2021-03-14 DIAGNOSIS — I2510 Atherosclerotic heart disease of native coronary artery without angina pectoris: Secondary | ICD-10-CM | POA: Insufficient documentation

## 2021-03-14 DIAGNOSIS — K6389 Other specified diseases of intestine: Secondary | ICD-10-CM | POA: Diagnosis not present

## 2021-03-14 DIAGNOSIS — Z794 Long term (current) use of insulin: Secondary | ICD-10-CM | POA: Insufficient documentation

## 2021-03-14 DIAGNOSIS — R1310 Dysphagia, unspecified: Secondary | ICD-10-CM

## 2021-03-14 DIAGNOSIS — J329 Chronic sinusitis, unspecified: Secondary | ICD-10-CM | POA: Insufficient documentation

## 2021-03-14 DIAGNOSIS — Z9049 Acquired absence of other specified parts of digestive tract: Secondary | ICD-10-CM | POA: Diagnosis not present

## 2021-03-14 DIAGNOSIS — R109 Unspecified abdominal pain: Secondary | ICD-10-CM | POA: Diagnosis not present

## 2021-03-14 DIAGNOSIS — E1159 Type 2 diabetes mellitus with other circulatory complications: Secondary | ICD-10-CM | POA: Diagnosis not present

## 2021-03-14 DIAGNOSIS — R197 Diarrhea, unspecified: Secondary | ICD-10-CM

## 2021-03-14 DIAGNOSIS — R10814 Left lower quadrant abdominal tenderness: Secondary | ICD-10-CM | POA: Diagnosis not present

## 2021-03-14 DIAGNOSIS — R7402 Elevation of levels of lactic acid dehydrogenase (LDH): Secondary | ICD-10-CM | POA: Diagnosis not present

## 2021-03-14 DIAGNOSIS — K529 Noninfective gastroenteritis and colitis, unspecified: Secondary | ICD-10-CM | POA: Diagnosis not present

## 2021-03-14 DIAGNOSIS — Z79899 Other long term (current) drug therapy: Secondary | ICD-10-CM | POA: Diagnosis not present

## 2021-03-14 DIAGNOSIS — Z7984 Long term (current) use of oral hypoglycemic drugs: Secondary | ICD-10-CM | POA: Insufficient documentation

## 2021-03-14 DIAGNOSIS — R519 Headache, unspecified: Secondary | ICD-10-CM | POA: Diagnosis not present

## 2021-03-14 DIAGNOSIS — E7800 Pure hypercholesterolemia, unspecified: Secondary | ICD-10-CM | POA: Diagnosis not present

## 2021-03-14 DIAGNOSIS — Z28310 Unvaccinated for covid-19: Secondary | ICD-10-CM | POA: Diagnosis not present

## 2021-03-14 DIAGNOSIS — E119 Type 2 diabetes mellitus without complications: Secondary | ICD-10-CM | POA: Diagnosis not present

## 2021-03-14 DIAGNOSIS — R638 Other symptoms and signs concerning food and fluid intake: Secondary | ICD-10-CM | POA: Diagnosis not present

## 2021-03-14 DIAGNOSIS — J9811 Atelectasis: Secondary | ICD-10-CM | POA: Diagnosis not present

## 2021-03-14 DIAGNOSIS — Z7982 Long term (current) use of aspirin: Secondary | ICD-10-CM | POA: Diagnosis not present

## 2021-03-14 DIAGNOSIS — J321 Chronic frontal sinusitis: Secondary | ICD-10-CM | POA: Diagnosis not present

## 2021-03-14 DIAGNOSIS — K7469 Other cirrhosis of liver: Secondary | ICD-10-CM

## 2021-03-14 DIAGNOSIS — R7401 Elevation of levels of liver transaminase levels: Secondary | ICD-10-CM | POA: Diagnosis not present

## 2021-03-14 DIAGNOSIS — E1140 Type 2 diabetes mellitus with diabetic neuropathy, unspecified: Secondary | ICD-10-CM | POA: Diagnosis not present

## 2021-03-14 DIAGNOSIS — A419 Sepsis, unspecified organism: Secondary | ICD-10-CM | POA: Diagnosis not present

## 2021-03-14 DIAGNOSIS — A088 Other specified intestinal infections: Secondary | ICD-10-CM | POA: Diagnosis not present

## 2021-03-14 DIAGNOSIS — Z2820 Immunization not carried out because of patient decision for unspecified reason: Secondary | ICD-10-CM | POA: Diagnosis not present

## 2021-03-14 DIAGNOSIS — R531 Weakness: Secondary | ICD-10-CM | POA: Diagnosis not present

## 2021-03-14 DIAGNOSIS — I200 Unstable angina: Secondary | ICD-10-CM

## 2021-03-14 DIAGNOSIS — R509 Fever, unspecified: Secondary | ICD-10-CM | POA: Diagnosis not present

## 2021-03-14 DIAGNOSIS — K760 Fatty (change of) liver, not elsewhere classified: Secondary | ICD-10-CM | POA: Diagnosis not present

## 2021-03-14 LAB — CBC WITH DIFFERENTIAL/PLATELET
Abs Immature Granulocytes: 0.04 10*3/uL (ref 0.00–0.07)
Basophils Absolute: 0 10*3/uL (ref 0.0–0.1)
Basophils Relative: 1 %
Eosinophils Absolute: 0 10*3/uL (ref 0.0–0.5)
Eosinophils Relative: 0 %
HCT: 47.2 % (ref 39.0–52.0)
Hemoglobin: 15 g/dL (ref 13.0–17.0)
Immature Granulocytes: 1 %
Lymphocytes Relative: 7 %
Lymphs Abs: 0.5 10*3/uL — ABNORMAL LOW (ref 0.7–4.0)
MCH: 26.3 pg (ref 26.0–34.0)
MCHC: 31.8 g/dL (ref 30.0–36.0)
MCV: 82.8 fL (ref 80.0–100.0)
Monocytes Absolute: 0.9 10*3/uL (ref 0.1–1.0)
Monocytes Relative: 12 %
Neutro Abs: 5.9 10*3/uL (ref 1.7–7.7)
Neutrophils Relative %: 79 %
Platelets: 180 10*3/uL (ref 150–400)
RBC: 5.7 MIL/uL (ref 4.22–5.81)
RDW: 13.8 % (ref 11.5–15.5)
WBC: 7.4 10*3/uL (ref 4.0–10.5)
nRBC: 0 % (ref 0.0–0.2)

## 2021-03-14 LAB — COMPREHENSIVE METABOLIC PANEL
ALT: 43 U/L (ref 0–44)
AST: 34 U/L (ref 15–41)
Albumin: 4.2 g/dL (ref 3.5–5.0)
Alkaline Phosphatase: 46 U/L (ref 38–126)
Anion gap: 10 (ref 5–15)
BUN: 22 mg/dL — ABNORMAL HIGH (ref 6–20)
CO2: 21 mmol/L — ABNORMAL LOW (ref 22–32)
Calcium: 9.3 mg/dL (ref 8.9–10.3)
Chloride: 104 mmol/L (ref 98–111)
Creatinine, Ser: 1.13 mg/dL (ref 0.61–1.24)
GFR, Estimated: >60 mL/min (ref >60)
Glucose, Bld: 201 mg/dL — ABNORMAL HIGH (ref 70–99)
Potassium: 4.1 mmol/L (ref 3.5–5.1)
Sodium: 135 mmol/L (ref 135–145)
Total Bilirubin: 1.1 mg/dL (ref 0.3–1.2)
Total Protein: 7.5 g/dL (ref 6.5–8.1)

## 2021-03-14 LAB — TROPONIN I (HIGH SENSITIVITY)
Troponin I (High Sensitivity): 5 ng/L (ref <18)
Troponin I (High Sensitivity): 3 ng/L (ref <18)

## 2021-03-14 LAB — RESP PANEL BY RT-PCR (FLU A&B, COVID) ARPGX2
Influenza A by PCR: NEGATIVE
Influenza B by PCR: NEGATIVE
SARS Coronavirus 2 by RT PCR: NEGATIVE

## 2021-03-14 LAB — MAGNESIUM: Magnesium: 1.4 mg/dL — ABNORMAL LOW (ref 1.7–2.4)

## 2021-03-14 MED ORDER — IOHEXOL 350 MG/ML SOLN
100.0000 mL | Freq: Once | INTRAVENOUS | Status: AC | PRN
  Administered 2021-03-14: 100 mL via INTRAVENOUS

## 2021-03-14 MED ORDER — KETOROLAC TROMETHAMINE 30 MG/ML IJ SOLN
30.0000 mg | Freq: Once | INTRAMUSCULAR | Status: AC
  Administered 2021-03-14: 30 mg via INTRAVENOUS
  Filled 2021-03-14: qty 1

## 2021-03-14 MED ORDER — MAGNESIUM SULFATE 2 GM/50ML IV SOLN
2.0000 g | Freq: Once | INTRAVENOUS | Status: AC
  Administered 2021-03-14: 2 g via INTRAVENOUS
  Filled 2021-03-14: qty 50

## 2021-03-14 MED ORDER — LACTATED RINGERS IV BOLUS
1000.0000 mL | Freq: Once | INTRAVENOUS | Status: AC
  Administered 2021-03-14: 1000 mL via INTRAVENOUS

## 2021-03-14 MED ORDER — LOPERAMIDE HCL 2 MG PO CAPS
4.0000 mg | ORAL_CAPSULE | Freq: Once | ORAL | Status: AC
  Administered 2021-03-14: 4 mg via ORAL
  Filled 2021-03-14: qty 2

## 2021-03-14 MED ORDER — ONDANSETRON HCL 4 MG/2ML IJ SOLN
4.0000 mg | Freq: Once | INTRAMUSCULAR | Status: AC
  Administered 2021-03-14: 4 mg via INTRAVENOUS
  Filled 2021-03-14: qty 2

## 2021-03-14 NOTE — ED Notes (Signed)
Pt freestyle Libre removed and discarded in sharps prior to xray. Pt reports he has more at home and does not need contact information for obtaining replacement.

## 2021-03-14 NOTE — Telephone Encounter (Signed)
Discussed with pt. Pt verbalized understanding. Appt made for tomorrow.

## 2021-03-14 NOTE — Telephone Encounter (Signed)
Should not do the EGD earlier as it is important to assess wound healing which at least takes 6 weeks to resolve.  Regarding his diarrhea, he needs to have the evaluation in the ER but if they cannot find a source, he will need to reach [place] back to start him on a medication from [place] (if not taking this now, he will be started on it )  Thanks

## 2021-03-14 NOTE — ED Notes (Signed)
Report received from [place], [place]

## 2021-03-14 NOTE — ED Notes (Signed)
Wife updated on plan of care at this time.

## 2021-03-14 NOTE — Telephone Encounter (Signed)
Pt's wife called. States yesterday pt was having diarrhea and nausea. This morning having back and chest pain and went to ED. Wife states troponin and covid test were negative. States he is shaking and fever of 101 now at home. Has not gave anything for fever. Checked bs 227 and she gave him humalog. Ate 2 bites of a sandwich and could not eat anymore. He is sipping fluids. Had diarrhea 3 times today and several times yesterday. No blood or mucus in it.

## 2021-03-14 NOTE — ED Provider Notes (Signed)
Care of the patient assumed at the change of shift. Here for diarrhea, was recently on doxycycline for sinusitis. Has had sinus pressure, nasal congestion for several weeks. Awaiting CTA chest for persistent tachycardia and CT sinuses.  Physical Exam  BP 130/75   Pulse (!) 109   Temp 100.2 F (37.9 C) (Oral)   Resp (!) 25   Ht 6' 2"  (1.88 m)   Wt 120.2 kg   SpO2 93%   BMI 34.02 kg/m   Physical Exam  ED Course/Procedures     Procedures  MDM  CTA chest neg for PE. CT sinuses is negative as well. Patient's tachycardia improving with IVF. He has only had one BM since arrival here. He is still complaining of facial pain/pressure and nasal congestion. His symptoms may be due to cluster headaches but definitely not due to sinusitis. Will give a dose of Toradol, plan discharge home with PCP follow up, consider referral to Neurology and/or ENT if facial pain persists.        Truddie Hidden, MD 03/14/21 [phone]

## 2021-03-14 NOTE — Telephone Encounter (Signed)
Please see telephone message from earlier today Wife texted me this evening approximately [phone] stating that the patient was having 103 fever shaking chills and that his hands appeared ashen  I happened to be within their neighborhood and came my in the home after speaking with the wife on the phone  Patient's blood pressure was 122/72 with a heart rate of 125 was feverish and O2 saturation 92%  Abdomen was soft extremities no edema  I instructed his wife-Shea-to call EMS immediately Patient denied any tick bites there was no rashes seen EMS was able to get him into the ambulance and was giving him IV fluids.  Initially it was thought he was going to [name], ER doctor was spoken with Then I was informed that EMS was taking him to the [facility] I called the ER and inform them that all information was available through epic Also that patient was seen earlier in the day in the ER and reviewed the results with the charge nurse and passed along my cell phone number.  They will do further evaluation, wife was informed Hold on Wednesday's office visit for now

## 2021-03-14 NOTE — Telephone Encounter (Signed)
Leigh ann would you mind following up with Mr. Buescher regarding the date, time and instructions of the EGD in July Please? Thanks! Patient states he is now at home. He is aware we should hold off on the follow up EGD. We will still have it done around 03/29/2021. He states the Ed gave him imodium to help with the diarrhea.

## 2021-03-14 NOTE — ED Provider Notes (Signed)
[facility] EMERGENCY DEPARTMENT Provider Note   CSN: [id number] Arrival date & time: 03/14/21  0433     History Chief Complaint  Patient presents with   Chest Pain    Jared Griffin is a 60 y.o. male.  The history is provided by the patient.  Chest Pain He has history of hypertension, diabetes, hyperlipidemia, COPD, coronary artery disease status post stent placement, NASH and comes in with onset yesterday of severe diarrhea.  He estimates 9 or 10 watery bowel movements.  There has been no blood or mucus in his stool.  There has been rather intense nausea but no vomiting.  He has also had a dull midsternal chest pain with no associated dyspnea.  He has had diaphoresis.  He is a non-smoker and there is no family history of premature coronary atherosclerosis.  He denies any sick contacts.  He has not taken any medication for his symptoms.   Past Medical History:  Diagnosis Date   Anxiety    COPD (chronic obstructive pulmonary disease) ([place])    Coronary atherosclerosis of native coronary artery    a. 12/09/2013: s/p PCI with 3.0 x 28 Promus DES to mLAD   Depression    Diabetic neuropathy ([place])    Elbow injury 07/25/2017   Surgery for torn tendon   Essential hypertension    Fatty liver disease, nonalcoholic    GERD (gastroesophageal reflux disease)    Lateral epicondylitis of right elbow    Mixed hyperlipidemia    Obesity    Osgood-Schlatter's disease of right knee    Skin cyst 10/03/2017   Removed from back   Type 2 diabetes mellitus ([place])     Patient Active Problem List   Diagnosis Date Noted   Orthostatic hypotension 12/07/2020   IBS (irritable bowel syndrome) 11/17/2020   Chronic diarrhea 08/17/2020   NASH (nonalcoholic steatohepatitis) 08/17/2020   Elevated LFTs 08/17/2020   Tremor of both hands 05/06/2020   Bilateral occipital neuralgia 05/06/2020   Abdominal pain, chronic, epigastric 01/13/2019   Anxiety 01/04/2019   Radicular pain of left lower extremity  12/22/2018   Moderate persistent asthma 10/25/2018   Dizziness 07/12/2018   Nonintractable headache    Abnormal nuclear stress test    Aftercare following surgery 07/25/17 08/13/2017   Hx of colonic polyps 08/07/2017   Lateral epicondylitis of right elbow    Cervical nerve root impingement 12/09/2015   Generalized anxiety disorder 09/27/2015   Chest pain 09/20/2015   Pain in the chest    Depression 01/24/2015   Esophageal reflux 01/24/2015   Preoperative cardiovascular examination 06/28/2014   DM type 2 causing vascular disease ([place])    Mixed hyperlipidemia    Obesity    Intermediate coronary syndrome ([place]) 12/09/2013   Lumbago 05/21/2012   Essential hypertension, benign 12/28/2009   CAD S/P LAD DES March 2015 12/28/2009   Hyperlipidemia LDL goal <70 11/25/2009   Accelerating angina ([place]) 11/25/2009   DM 11/24/2009   ABDOMINAL PAIN, HX OF 11/24/2009    Past Surgical History:  Procedure Laterality Date   BIOPSY  01/21/2019   Procedure: BIOPSY;  Surgeon: [name], MD;  [facility]: [facility];  Service: Endoscopy;;  gastric bx and gastric polyps   BIOPSY  11/08/2020   Procedure: BIOPSY;  Surgeon: [name], MD;  Location: [facility];  Service: Gastroenterology;;   CARDIAC CATHETERIZATION  2011   CARDIAC CATHETERIZATION N/A 09/20/2015   Procedure: Left Heart Cath and Coronary Angiography;  Surgeon: Annita Brod  Angelena Form, MD;  Location: [place] [facility];  Service: Cardiovascular;  Laterality: N/A;   CHOLECYSTECTOMY  2003   COLONOSCOPY  06/19/2012   Procedure: COLONOSCOPY;  Surgeon: [name], MD;  Location: [facility];  Service: Endoscopy;  Laterality: N/A;  930   COLONOSCOPY N/A 10/17/2017   Procedure: COLONOSCOPY;  Surgeon: [name], MD;  [facility]: [facility];  Service: Endoscopy;  Laterality: N/A;  1225   COLONOSCOPY WITH PROPOFOL N/A 11/08/2020   Procedure: COLONOSCOPY WITH PROPOFOL;  Surgeon: [name], MD;   [facility];  Service: Gastroenterology;  Laterality: N/A;  10:45   CYST EXCISION  07/2019   ESOPHAGOGASTRODUODENOSCOPY (EGD) WITH PROPOFOL N/A 01/21/2019   Procedure: ESOPHAGOGASTRODUODENOSCOPY (EGD) WITH PROPOFOL;  Surgeon: [name], MD;  [facility];  Service: Endoscopy;  Laterality: N/A;  10:15am   ESOPHAGOGASTRODUODENOSCOPY (EGD) WITH PROPOFOL N/A 02/17/2021   Procedure: ESOPHAGOGASTRODUODENOSCOPY (EGD) WITH PROPOFOL;  Surgeon: [name], MD;  [facility];  Service: Gastroenterology;  Laterality: N/A;  10:20   KNEE ARTHROPLASTY Right 1999   LEFT HEART CATH AND CORONARY ANGIOGRAPHY N/A 02/26/2018   Procedure: LEFT HEART CATH AND CORONARY ANGIOGRAPHY;  Surgeon: [name], MD;  Location: [place] [facility];  Service: Cardiovascular;  Laterality: N/A;   LEFT HEART CATHETERIZATION WITH CORONARY ANGIOGRAM N/A 12/09/2013   Procedure: LEFT HEART CATHETERIZATION WITH CORONARY ANGIOGRAM;  Surgeon: [name], MD;  Location: [facility] CATH LAB;  Service: Cardiovascular;  Laterality: N/A;   Liver biopsy     POLYPECTOMY  10/17/2017   Procedure: POLYPECTOMY;  Surgeon: [name], MD;  [facility];  Service: Endoscopy;;  ascending colon, splenic flexure,sigmoid x2   POLYPECTOMY  11/08/2020   Procedure: POLYPECTOMY;  Surgeon: [name], MD;  [facility];  Service: Gastroenterology;;   TENNIS ELBOW RELEASE/NIRSCHEL PROCEDURE Right 07/25/2017   Procedure: TENNIS ELBOW RELEASE and debriedment;  Surgeon: [name], MD;  Location: [facility];  Service: Orthopedics;  Laterality: Right;   TONSILLECTOMY  1970's       Family History  Problem Relation Age of Onset   Osteoporosis Mother    Cancer Maternal Aunt    CVA Maternal Grandmother    CVA Maternal Grandfather    Healthy Son     Social History   Tobacco Use   Smoking status: Former    Packs/day: 0.50    Years: 10.00    Pack years:  5.00    Types: Cigarettes    Start date: 02/02/1973    Quit date: 09/17/1983    Years since quitting: 37.5   Smokeless tobacco: Former    Types: Chew    Quit date: 09/17/1983  Vaping Use   Vaping Use: Never used  Substance Use Topics   Alcohol use: No    Alcohol/week: 0.0 standard drinks   Drug use: No    Home Medications Prior to Admission medications   Medication Sig Start Date End Date Taking? Authorizing Provider  acetaminophen (TYLENOL) 500 MG tablet Take 1,500 mg by mouth every 8 (eight) hours as needed for moderate pain.    [provider]  albuterol (VENTOLIN HFA) 108 (90 Base) MCG/ACT inhaler INHALE 2 PUFFS INTO LUNGS EVERY 6 HOURS AS NEEDED FOR WHEEZING AND SHORTNESS OF BREATH. Patient taking differently: Inhale 2 puffs into the lungs every 6 (six) hours as needed for shortness of breath or wheezing. INHALE 2 PUFFS INTO LUNGS EVERY 6 HOURS AS  NEEDED FOR WHEEZING AND SHORTNESS OF BREATH. 08/10/19   Mikey Kirschner, MD  ALPRAZolam Duanne Moron) 1 MG tablet Take 1 tablet (1 mg total) by mouth 3 (three) times daily as needed for anxiety. 01/26/21 01/26/22  Cloria Spring, MD  aspirin 81 MG EC tablet Take 1 tablet (81 mg total) by mouth daily. 01/22/19   Rehman, Mechele Dawley, MD  atorvastatin (LIPITOR) 40 MG tablet Take 1 tablet (40 mg total) by mouth daily. 09/30/20   Satira Sark, MD  B-D UF III MINI PEN NEEDLES 31G X 5 MM MISC USE UP TO 4 TIMES DAILY WITH INSULIN AS DIRECTED. 03/09/21   Nida, Marella Chimes, MD  benzonatate (TESSALON) 100 MG capsule Take 1 capsule (100 mg total) by mouth every 8 (eight) hours. 02/03/21   Wurst, [place], PA-C  bisoprolol (ZEBETA) 5 MG tablet Take 1 tablet (5 mg total) by mouth daily. 08/05/20   Satira Sark, MD  cetirizine (ZYRTEC) 10 MG tablet Take 1 tablet (10 mg total) by mouth daily. Patient taking differently: Take 10 mg by mouth daily as needed for allergies. 09/13/19   Wurst, [place], PA-C  Continuous Blood Gluc Sensor (FREESTYLE  LIBRE 14 DAY SENSOR) MISC Inject 1 each into the skin every 14 (fourteen) days. Use as directed. 12/24/19   Cassandria Anger, MD  dapagliflozin propanediol (FARXIGA) 5 MG TABS tablet Take 5 mg by mouth daily. 03/31/20   [provider]  dicyclomine (BENTYL) 10 MG capsule Take 1 capsule (10 mg total) by mouth 2 (two) times daily before a meal. 08/17/20   Montez Morita, Quillian Quince, MD  Exenatide ER (BYDUREON BCISE) 2 MG/0.85ML AUIJ Inject 2 mg into the skin every Monday. 03/31/20   [provider]  fluticasone (FLONASE) 50 MCG/ACT nasal spray Place 2 sprays into both nostrils daily. Patient taking differently: Place 2 sprays into both nostrils daily as needed for allergies. 09/13/19   Wurst, [place], PA-C  fluticasone-salmeterol (ADVAIR HFA) 786-76 MCG/ACT inhaler Inhale 2 puffs into the lungs 2 (two) times daily. 12/09/19   Mikey Kirschner, MD  gabapentin (NEURONTIN) 300 MG capsule TAKE ONE (1) CAPSULE BY MOUTH 4 TIMES DAILY Patient taking differently: Take 300 mg by mouth 2 (two) times daily. 09/02/20   [name], MD  HUMALOG KWIKPEN 200 UNIT/ML KwikPen Inject 35-40 Units into the skin 3 (three) times daily. 10/18/20   [provider]  hyoscyamine (LEVSIN SL) 0.125 MG SL tablet Place 1 tablet (0.125 mg total) under the tongue every 4 (four) hours as needed. Patient taking differently: Place 0.125 mg under the tongue every 4 (four) hours as needed for cramping. 06/08/20   Laurine Blazer B, PA-C  ipratropium (ATROVENT) 0.06 % nasal spray Place 2 sprays into both nostrils in the morning and at bedtime.    [provider]  Lancets MISC 1 each by Does not apply route 4 (four) times daily. 09/24/19   Cassandria Anger, MD  lidocaine (LIDODERM) 5 % Place 1 patch onto the skin daily. Place 1 patch to your are of most significant pain on your neck once per day as needed. Remove & Discard patch within 12 hours Patient taking differently: Place 1 patch onto the skin  daily as needed (pain). Place 1 patch to your are of most significant pain on your neck once per day as needed. Remove & Discard patch within 12 hours 02/05/20   Petrucelli, Samantha R, PA-C  losartan (COZAAR) 25 MG tablet TAKE (1) TABLET BY MOUTH ONCE  DAILY. Patient taking differently: Take 25 mg by mouth daily. 10/07/20   Satira Sark, MD  metFORMIN (GLUCOPHAGE) 500 MG tablet TAKE 2 TABLETS BY MOUTH TWICE DAILY WITH A MEAL. Patient taking differently: Take 1,000 mg by mouth 2 (two) times daily with a meal. 05/02/20   Nida, Marella Chimes, MD  methocarbamol (ROBAXIN) 500 MG tablet Take 500 mg by mouth every 8 (eight) hours as needed for muscle spasms.    [provider]  nitroGLYCERIN (NITROSTAT) 0.4 MG SL tablet Place 1 tablet (0.4 mg total) under the tongue every 5 (five) minutes as needed. Patient taking differently: Place 0.4 mg under the tongue every 5 (five) minutes as needed for chest pain. 02/19/20   Herminio Commons, MD  omeprazole (PRILOSEC) 40 MG capsule Take 1 capsule (40 mg total) by mouth 2 (two) times daily. 02/17/21   [name], MD  ondansetron (ZOFRAN) 4 MG tablet Take 4 mg by mouth every 8 (eight) hours as needed for nausea or vomiting.    [provider]  ranolazine (RANEXA) 1000 MG SR tablet Take 1 tablet (1,000 mg total) by mouth 2 (two) times daily. 11/02/20   Satira Sark, MD  sertraline (ZOLOFT) 100 MG tablet Take 1.5 tablets (150 mg total) by mouth daily. 01/26/21   Cloria Spring, MD  sucralfate (CARAFATE) 1 GM/10ML suspension Take 10 mLs (1 g total) by mouth 4 (four) times daily. 02/17/21 02/17/22  [name], MD  TOUJEO MAX SOLOSTAR 300 UNIT/ML Solostar Pen INJECT 130 UNITS INTO THE SKIN AT BEDTIME. Patient taking differently: Inject 60 Units into the skin at bedtime. 04/11/20   Cassandria Anger, MD  traZODone (DESYREL) 50 MG tablet Take 1 tablet (50 mg total) by mouth at bedtime as needed. Patient taking  differently: Take 50 mg by mouth at bedtime as needed for sleep. 11/02/20   Cloria Spring, MD    Allergies    Patient has no known allergies.  Review of Systems   Review of Systems  Cardiovascular:  Positive for chest pain.  All other systems reviewed and are negative.  Physical Exam Updated Vital Signs BP 134/89 (BP Location: Left Arm)   Pulse (!) 122   Temp 100.2 F (37.9 C) (Oral)   Resp 20   Ht 6' 2"  (1.88 m)   Wt 120.2 kg   SpO2 95%   BMI 34.02 kg/m   Physical Exam Vitals and nursing note reviewed.  60 year old male, resting comfortably and in no acute distress. Vital signs are significant for rapid heart rate and slightly high temperature although not technically a fever. Oxygen saturation is 95%, which is normal. Head is normocephalic and atraumatic. PERRLA, EOMI. Oropharynx is clear. Neck is nontender and supple without adenopathy or JVD. Back is nontender and there is no CVA tenderness. Lungs are clear without rales, wheezes, or rhonchi. Chest is nontender. Heart has regular rate and rhythm without murmur. Abdomen is soft, flat, nontender without masses or hepatosplenomegaly and peristalsis is hypoactive. Extremities have no cyanosis or edema, full range of motion is present. Skin is warm and dry without rash. Neurologic: Mental status is normal, cranial nerves are intact, there are no motor or sensory deficits.  ED Results / Procedures / Treatments   Labs (all labs ordered are listed, but only abnormal results are displayed) Labs Reviewed  RESP PANEL BY RT-PCR (FLU A&B, COVID) ARPGX2  URINALYSIS, ROUTINE W REFLEX MICROSCOPIC  CBC WITH DIFFERENTIAL/PLATELET  COMPREHENSIVE METABOLIC PANEL  TROPONIN I (HIGH SENSITIVITY)    EKG EKG Interpretation  Date/Time:  Tuesday March 14 2021 04:49:42 EDT Ventricular Rate:  118 PR Interval:  190 QRS Duration: 71 QT Interval:  370 QTC Calculation: 519 R Axis:   80 Text Interpretation: Sinus tachycardia Nonspecific  T abnormalities, diffuse leads Prolonged QT interval When compared with ECG of 02/05/2020, HEART RATE has increased QT has lengthened Nonspecific T wave abnormality is now present Confirmed by Delora Fuel ([id number]) on 03/14/2021 5:02:01 AM  Radiology No results found.  Procedures Procedures   Medications Ordered in ED Medications  lactated ringers bolus 1,000 mL (has no administration in time range)  ondansetron (ZOFRAN) injection 4 mg (has no administration in time range)  loperamide (IMODIUM) capsule 4 mg (has no administration in time range)    ED Course  I have reviewed the triage vital signs and the nursing notes.  Pertinent labs & imaging results that were available during my care of the patient were reviewed by me and considered in my medical decision making (see chart for details).   MDM Rules/Calculators/A&P                         Nausea and diarrhea suggestive of viral gastroenteritis.  Atypical chest pain, doubt cardiac cause.  ECG is obtained showing tachycardia, nonspecific T wave changes and slightly prolonged QT interval.  Will give IV fluids, check electrolytes including magnesium.  He is given ondansetron for nausea and loperamide for diarrhea.  Old records are reviewed, showing prior hospitalizations for coronary artery disease, recent ED visit for a dyne aphasia which was possibly secondary to pill esophagitis.  Electrolytes are normal and renal function is normal.  Magnesium is slightly low and he is given a dose of IV magnesium.  WBC is normal as is hemoglobin.  Following IV fluids heart rate has improved, but he is still tachycardic.  Diarrhea seems to have slowed down.  COVID screen is negative.  He is asking for antibiotics for his sinus infection.  Apparently, he had been given doxycycline for sinus infection recently and that is why he developed pill esophagitis.  On reexam, he does have sinus tenderness.  However, with his diarrhea, I am concerned about giving him  antibiotics.  He will be sent for CT scan of his sinuses to look for evidence of actual sinusitis.  Case is signed out to Dr. Karle Starch.  We will also get CT angiogram of the chest to rule out pulmonary embolism given persistent tachycardia and chest pain.  Final Clinical Impression(s) / ED Diagnoses Final diagnoses:  Diarrhea, unspecified type  Sinus pain  Atypical chest pain    Rx / DC Orders ED Discharge Orders     None        Delora Fuel, MD 74/82/70 [phone]

## 2021-03-14 NOTE — ED Triage Notes (Signed)
Pt c/o chest pain, weakness, and diarrhea since yesterday about noon. Pt states he was recently treated for sinus infection.

## 2021-03-14 NOTE — Telephone Encounter (Signed)
Patient at Ed now has been since this am. He states he has been nauseated and having diarrhea. He states he needs some relief. He had a recent EGD done 02/17/2021 and was to repeat it in six weeks. He would like to know if this can be moved up. Scheduled for 03/29/2021. Please advise.He is at [phone]

## 2021-03-14 NOTE — Telephone Encounter (Signed)
More than likely a viral process But at the same time cannot rule out other problems I did look at the ER notes and x-rays and lab work I do not see any signs of pneumonia CAT scan did not show sinusitis May use Zofran as needed for nausea 8 mg 1 taken 3 times daily as needed nausea may have prescription for 15 if he does not have any with 1 refill We can offer him a recheck tomorrow at 1140 here If he has progressive symptoms in the evening or during the night he may have to go back to the ER if his condition worsens this evening or becomes worrisome Very important for him to drink plenty of fluids Monitor your sugar at least 3 times daily

## 2021-03-15 ENCOUNTER — Encounter (INDEPENDENT_AMBULATORY_CARE_PROVIDER_SITE_OTHER): Payer: Self-pay

## 2021-03-15 ENCOUNTER — Ambulatory Visit: Payer: Medicare Other | Admitting: Family Medicine

## 2021-03-15 DIAGNOSIS — A419 Sepsis, unspecified organism: Secondary | ICD-10-CM | POA: Insufficient documentation

## 2021-03-15 DIAGNOSIS — R000 Tachycardia, unspecified: Secondary | ICD-10-CM | POA: Insufficient documentation

## 2021-03-15 DIAGNOSIS — R7989 Other specified abnormal findings of blood chemistry: Secondary | ICD-10-CM | POA: Insufficient documentation

## 2021-03-15 DIAGNOSIS — R109 Unspecified abdominal pain: Secondary | ICD-10-CM | POA: Diagnosis not present

## 2021-03-15 DIAGNOSIS — R509 Fever, unspecified: Secondary | ICD-10-CM | POA: Insufficient documentation

## 2021-03-15 NOTE — Telephone Encounter (Signed)
Appt held for today (Wednesday)

## 2021-03-15 NOTE — Telephone Encounter (Signed)
error 

## 2021-03-16 NOTE — Telephone Encounter (Signed)
Thanks, please let me know if the patient wants a sooner follow up appointment

## 2021-03-16 NOTE — Telephone Encounter (Signed)
Per Note in epic patient is in [facility] with Sepsis.

## 2021-03-21 ENCOUNTER — Telehealth (INDEPENDENT_AMBULATORY_CARE_PROVIDER_SITE_OTHER): Payer: Self-pay

## 2021-03-21 ENCOUNTER — Other Ambulatory Visit: Payer: Self-pay

## 2021-03-21 ENCOUNTER — Ambulatory Visit (INDEPENDENT_AMBULATORY_CARE_PROVIDER_SITE_OTHER): Payer: Medicare Other | Admitting: Family Medicine

## 2021-03-21 ENCOUNTER — Other Ambulatory Visit: Payer: Self-pay | Admitting: Family Medicine

## 2021-03-21 ENCOUNTER — Other Ambulatory Visit (INDEPENDENT_AMBULATORY_CARE_PROVIDER_SITE_OTHER): Payer: Self-pay

## 2021-03-21 VITALS — BP 122/76 | Temp 93.9°F | Wt 263.4 lb

## 2021-03-21 DIAGNOSIS — A09 Infectious gastroenteritis and colitis, unspecified: Secondary | ICD-10-CM

## 2021-03-21 DIAGNOSIS — K51919 Ulcerative colitis, unspecified with unspecified complications: Secondary | ICD-10-CM

## 2021-03-21 DIAGNOSIS — E611 Iron deficiency: Secondary | ICD-10-CM | POA: Diagnosis not present

## 2021-03-21 DIAGNOSIS — R7401 Elevation of levels of liver transaminase levels: Secondary | ICD-10-CM

## 2021-03-21 DIAGNOSIS — Z79899 Other long term (current) drug therapy: Secondary | ICD-10-CM

## 2021-03-21 NOTE — Telephone Encounter (Signed)
Thanks. Yes, please add on the colonoscopy on the same day, Dx: rule out ulcerative colitis  Thanks

## 2021-03-21 NOTE — Telephone Encounter (Signed)
Jared Griffin would you mind adding this on to current schedule? If I can help let me know. Thanks  Patient aware of all and aware if anything changes we would notify him.

## 2021-03-21 NOTE — Telephone Encounter (Signed)
Thanks

## 2021-03-21 NOTE — Telephone Encounter (Signed)
I spoke with the patient's wife [name].She states the patient was discharged from [facility] over the weekend with Sepsis and Ulcerative colitis. She states the Doctor at [facility] wants the patient to have a TCS with in the next six weeks. Patient is scheduled for Pre op on 03/31/2021 at 2:45 pm and Egd scheduled on 04/04/2021 @ 11 am. Would you like to add a TCS to the EGD ? Please advise. I Did let her know I would be checking with you to see if you wanted to add. Thanks

## 2021-03-21 NOTE — Telephone Encounter (Signed)
I called and left a message asked that patient call and let me know what pharmacy he needs prep sent to .

## 2021-03-21 NOTE — Telephone Encounter (Signed)
There is conflicting evidence within the chart please clarify with patient is he taking this twice daily?  Or is he taking it 4 times a day?  Thank you

## 2021-03-21 NOTE — Progress Notes (Signed)
   Subjective:    Patient ID: Jared Griffin, male    DOB: [birth year], 60 y.o.   MRN: [id number]  HPI Pt here for hospital follow up. Pt was in [facility] for 5 days. Pt still having pain in stomach, nausea and feeling "yucky".  Patient has not been feeling well Went home with colitis They sent him home on Cipro and Flagyl which gives him a bad taste in the mouth Denies any nausea Denies fever chills or sweats Hospitalist H&P as well as CAT scan and lab work was reviewed There is no discharge summary currently  Should be noted while the patient was in the hospital and had multiple contacts with family as well as when he came home and his needs were met as a outpatient phone calls and he is here today for transitional care.  Review of Systems     Objective:   Physical Exam  Lungs are clear heart regular pulse normal extremities no edema  Mild abdominal tenderness on the left side not severe    Assessment & Plan:  Infectious colitis Finish out Cipro and Flagyl as prescribed Follow-up with gastroenterology Recheck here within 3 to 4 weeks if not dramatically better otherwise annual wellness in the early fall Keep follow-up visits with endocrinology patient's sugars have been running on the low side he will somewhat taper down on his insulin shots and call his endocrinologist with any additional questions Follow-up with [place] if any problems

## 2021-03-21 NOTE — Telephone Encounter (Signed)
Colonoscopy has been added to the Egd on 04/04/21

## 2021-03-22 DIAGNOSIS — E1165 Type 2 diabetes mellitus with hyperglycemia: Secondary | ICD-10-CM | POA: Diagnosis not present

## 2021-03-23 ENCOUNTER — Telehealth (INDEPENDENT_AMBULATORY_CARE_PROVIDER_SITE_OTHER): Payer: Self-pay

## 2021-03-23 ENCOUNTER — Encounter (INDEPENDENT_AMBULATORY_CARE_PROVIDER_SITE_OTHER): Payer: Self-pay

## 2021-03-23 MED ORDER — PEG 3350-KCL-NA BICARB-NACL 420 G PO SOLR: 4000.0000 mL | ORAL | 0 refills | Status: DC

## 2021-03-23 NOTE — Telephone Encounter (Signed)
LeighAnn Livingston Denner, CMA  

## 2021-03-23 NOTE — Telephone Encounter (Signed)
Leigh did he ever call you back with pharmacy information? Or did you already send the prep in and information to his home. Just need to know so I can file chart. Thanks

## 2021-03-24 ENCOUNTER — Telehealth: Payer: Self-pay | Admitting: Family Medicine

## 2021-03-24 NOTE — Telephone Encounter (Signed)
Pt is taking 2 in the morning and 2 in the evening. Also see refill request. Please advise. Thank you

## 2021-03-24 NOTE — Telephone Encounter (Signed)
Patient is requesting refill on gabapentin 300 mg to be called into [facility]

## 2021-03-26 NOTE — Telephone Encounter (Signed)
May have 6 months refill based upon this request to reflect the way he takes it

## 2021-03-27 ENCOUNTER — Other Ambulatory Visit: Payer: Self-pay | Admitting: *Deleted

## 2021-03-27 MED ORDER — GABAPENTIN 300 MG PO CAPS: ORAL_CAPSULE | ORAL | 5 refills | Status: DC

## 2021-03-27 NOTE — Telephone Encounter (Signed)
Refills sent to pharm.

## 2021-03-31 ENCOUNTER — Other Ambulatory Visit: Payer: Self-pay

## 2021-03-31 ENCOUNTER — Encounter (HOSPITAL_COMMUNITY): Payer: Self-pay

## 2021-03-31 ENCOUNTER — Encounter (HOSPITAL_COMMUNITY)
Admit: 2021-03-31 | Discharge: 2021-03-31 | Disposition: A | Discharge status: Home / Self Care | Payer: Medicare Other | Attending: Gastroenterology | Admitting: Gastroenterology

## 2021-04-04 ENCOUNTER — Ambulatory Visit (HOSPITAL_COMMUNITY): Payer: Medicare Other | Admitting: Anesthesiology

## 2021-04-04 ENCOUNTER — Ambulatory Visit (HOSPITAL_COMMUNITY)
Admission: RE | Admit: 2021-04-04 | Discharge: 2021-04-04 | Disposition: A | Discharge status: Home / Self Care | Payer: Medicare Other | Attending: Gastroenterology | Admitting: Gastroenterology

## 2021-04-04 ENCOUNTER — Other Ambulatory Visit: Payer: Self-pay

## 2021-04-04 ENCOUNTER — Encounter (HOSPITAL_COMMUNITY): Payer: Self-pay | Admitting: Gastroenterology

## 2021-04-04 ENCOUNTER — Encounter (HOSPITAL_COMMUNITY)
Admission: RE | Disposition: A | Discharge status: Home / Self Care | Payer: Self-pay | Source: Home / Self Care | Attending: Gastroenterology

## 2021-04-04 DIAGNOSIS — Z09 Encounter for follow-up examination after completed treatment for conditions other than malignant neoplasm: Secondary | ICD-10-CM | POA: Diagnosis not present

## 2021-04-04 DIAGNOSIS — K648 Other hemorrhoids: Secondary | ICD-10-CM

## 2021-04-04 DIAGNOSIS — Z7984 Long term (current) use of oral hypoglycemic drugs: Secondary | ICD-10-CM | POA: Diagnosis not present

## 2021-04-04 DIAGNOSIS — I2510 Atherosclerotic heart disease of native coronary artery without angina pectoris: Secondary | ICD-10-CM | POA: Diagnosis not present

## 2021-04-04 DIAGNOSIS — Z794 Long term (current) use of insulin: Secondary | ICD-10-CM | POA: Insufficient documentation

## 2021-04-04 DIAGNOSIS — R197 Diarrhea, unspecified: Secondary | ICD-10-CM

## 2021-04-04 DIAGNOSIS — J449 Chronic obstructive pulmonary disease, unspecified: Secondary | ICD-10-CM | POA: Diagnosis not present

## 2021-04-04 DIAGNOSIS — K580 Irritable bowel syndrome with diarrhea: Secondary | ICD-10-CM | POA: Diagnosis not present

## 2021-04-04 DIAGNOSIS — D120 Benign neoplasm of cecum: Secondary | ICD-10-CM

## 2021-04-04 DIAGNOSIS — K7469 Other cirrhosis of liver: Secondary | ICD-10-CM

## 2021-04-04 DIAGNOSIS — Z79899 Other long term (current) drug therapy: Secondary | ICD-10-CM | POA: Insufficient documentation

## 2021-04-04 DIAGNOSIS — Z881 Allergy status to other antibiotic agents status: Secondary | ICD-10-CM | POA: Insufficient documentation

## 2021-04-04 DIAGNOSIS — K644 Residual hemorrhoidal skin tags: Secondary | ICD-10-CM | POA: Diagnosis not present

## 2021-04-04 DIAGNOSIS — Z87891 Personal history of nicotine dependence: Secondary | ICD-10-CM | POA: Diagnosis not present

## 2021-04-04 DIAGNOSIS — D175 Benign lipomatous neoplasm of intra-abdominal organs: Secondary | ICD-10-CM

## 2021-04-04 DIAGNOSIS — E782 Mixed hyperlipidemia: Secondary | ICD-10-CM | POA: Insufficient documentation

## 2021-04-04 DIAGNOSIS — Z6833 Body mass index (BMI) 33.0-33.9, adult: Secondary | ICD-10-CM | POA: Insufficient documentation

## 2021-04-04 DIAGNOSIS — I10 Essential (primary) hypertension: Secondary | ICD-10-CM | POA: Insufficient documentation

## 2021-04-04 DIAGNOSIS — E669 Obesity, unspecified: Secondary | ICD-10-CM | POA: Insufficient documentation

## 2021-04-04 DIAGNOSIS — I200 Unstable angina: Secondary | ICD-10-CM

## 2021-04-04 DIAGNOSIS — Z7982 Long term (current) use of aspirin: Secondary | ICD-10-CM | POA: Insufficient documentation

## 2021-04-04 DIAGNOSIS — K51919 Ulcerative colitis, unspecified with unspecified complications: Secondary | ICD-10-CM

## 2021-04-04 DIAGNOSIS — K317 Polyp of stomach and duodenum: Secondary | ICD-10-CM

## 2021-04-04 DIAGNOSIS — E1140 Type 2 diabetes mellitus with diabetic neuropathy, unspecified: Secondary | ICD-10-CM | POA: Insufficient documentation

## 2021-04-04 DIAGNOSIS — Z9049 Acquired absence of other specified parts of digestive tract: Secondary | ICD-10-CM | POA: Insufficient documentation

## 2021-04-04 DIAGNOSIS — Z7951 Long term (current) use of inhaled steroids: Secondary | ICD-10-CM | POA: Insufficient documentation

## 2021-04-04 DIAGNOSIS — K2210 Ulcer of esophagus without bleeding: Secondary | ICD-10-CM

## 2021-04-04 DIAGNOSIS — R1310 Dysphagia, unspecified: Secondary | ICD-10-CM

## 2021-04-04 DIAGNOSIS — K635 Polyp of colon: Secondary | ICD-10-CM | POA: Insufficient documentation

## 2021-04-04 DIAGNOSIS — I25119 Atherosclerotic heart disease of native coronary artery with unspecified angina pectoris: Secondary | ICD-10-CM | POA: Diagnosis not present

## 2021-04-04 DIAGNOSIS — Z8719 Personal history of other diseases of the digestive system: Secondary | ICD-10-CM | POA: Diagnosis not present

## 2021-04-04 HISTORY — PX: COLONOSCOPY WITH PROPOFOL: SHX5780

## 2021-04-04 HISTORY — PX: POLYPECTOMY: SHX5525

## 2021-04-04 HISTORY — PX: ESOPHAGOGASTRODUODENOSCOPY (EGD) WITH PROPOFOL: SHX5813

## 2021-04-04 SURGERY — ESOPHAGOGASTRODUODENOSCOPY (EGD) WITH PROPOFOL
Anesthesia: General

## 2021-04-04 MED ORDER — PROPOFOL 10 MG/ML IV BOLUS
INTRAVENOUS | Status: DC | PRN
  Administered 2021-04-04: 150 ug/kg/min via INTRAVENOUS
  Administered 2021-04-04: 100 mg via INTRAVENOUS

## 2021-04-04 MED ORDER — LACTATED RINGERS IV SOLN: INTRAVENOUS | Status: DC

## 2021-04-04 MED ORDER — OMEPRAZOLE 40 MG PO CPDR: 40.0000 mg | DELAYED_RELEASE_CAPSULE | Freq: Every day | ORAL | 3 refills | Status: DC

## 2021-04-04 MED ORDER — STERILE WATER FOR IRRIGATION IR SOLN
Status: DC | PRN
  Administered 2021-04-04: 1.5 mL

## 2021-04-04 MED ORDER — LIDOCAINE HCL (CARDIAC) PF 100 MG/5ML IV SOSY
PREFILLED_SYRINGE | INTRAVENOUS | Status: DC | PRN
  Administered 2021-04-04: 100 mg via INTRAVENOUS

## 2021-04-04 NOTE — Anesthesia Postprocedure Evaluation (Signed)
Anesthesia Post Note  Patient: Jared Griffin  Procedure(s) Performed: ESOPHAGOGASTRODUODENOSCOPY (EGD) WITH PROPOFOL COLONOSCOPY WITH PROPOFOL POLYPECTOMY  Patient location during evaluation: Phase II Anesthesia Type: General Level of consciousness: awake and alert and oriented Pain management: pain level controlled Vital Signs Assessment: post-procedure vital signs reviewed and stable Respiratory status: spontaneous breathing and respiratory function stable Cardiovascular status: blood pressure returned to baseline and stable Postop Assessment: no apparent nausea or vomiting Anesthetic complications: no   No notable events documented.   Last Vitals:  Vitals:   04/04/21 1026 04/04/21 1227  BP: (!) 155/99 101/67  Pulse: 100 95  Resp: 18   Temp: 36.6 C 36.7 C  SpO2: 97% 97%    Last Pain:  Vitals:   04/04/21 1227  TempSrc: Oral  PainSc: 0-No pain                 Hanae Waiters C Shenee Wignall

## 2021-04-04 NOTE — Anesthesia Preprocedure Evaluation (Addendum)
Anesthesia Evaluation  Patient identified by MRN, date of birth, ID band Patient awake    Reviewed: Allergy & Precautions, NPO status , Patient's Chart, lab work & pertinent test results, reviewed documented beta blocker date and time   History of Anesthesia Complications Negative for: history of anesthetic complications  Airway Mallampati: III  TM Distance: >3 FB Neck ROM: Full    Dental  (+) Dental Advisory Given, Chipped,    Pulmonary asthma , COPD, former smoker,    Pulmonary exam normal breath sounds clear to auscultation       Cardiovascular Exercise Tolerance: Good hypertension, Pt. on medications and Pt. on home beta blockers + angina + CAD and + Cardiac Stents (stentx1, 2015)  Normal cardiovascular exam Rhythm:Regular Rate:Normal     Neuro/Psych  Headaches, PSYCHIATRIC DISORDERS Anxiety Depression  Neuromuscular disease (tremors )    GI/Hepatic GERD  Medicated and Controlled,(+) Hepatitis -  Endo/Other  diabetes, Well Controlled, Type 2, Oral Hypoglycemic Agents  Renal/GU      Musculoskeletal  (+) Arthritis , Osteoarthritis,    Abdominal   Peds  Hematology   Anesthesia Other Findings   Reproductive/Obstetrics                           Anesthesia Physical Anesthesia Plan  ASA: 3  Anesthesia Plan: General   Post-op Pain Management:    Induction: Intravenous  PONV Risk Score and Plan: Propofol infusion  Airway Management Planned: Nasal Cannula and Natural Airway  Additional Equipment:   Intra-op Plan:   Post-operative Plan:   Informed Consent: I have reviewed the patients History and Physical, chart, labs and discussed the procedure including the risks, benefits and alternatives for the proposed anesthesia with the patient or authorized representative who has indicated his/her understanding and acceptance.     Dental advisory given  Plan Discussed with: CRNA and  Surgeon  Anesthesia Plan Comments:         Anesthesia Quick Evaluation

## 2021-04-04 NOTE — Discharge Instructions (Addendum)
You are being discharged to home.  Resume your previous diet.  Decrease omeprazole to 40 mg qday. Your physician has recommended a repeat colonoscopy in three years for surveillance. Will need a two day prep. Can take over the counter probiotic daily for one month.

## 2021-04-04 NOTE — Transfer of Care (Signed)
Immediate Anesthesia Transfer of Care Note  Patient: Jared Griffin  Procedure(s) Performed: ESOPHAGOGASTRODUODENOSCOPY (EGD) WITH PROPOFOL COLONOSCOPY WITH PROPOFOL POLYPECTOMY  Patient Location: Short Stay  Anesthesia Type:General  Level of Consciousness: awake, alert , oriented, patient cooperative and responds to stimulation  Airway & Oxygen Therapy: Patient Spontanous Breathing  Post-op Assessment: Report given to RN, Post -op Vital signs reviewed and stable and Patient moving all extremities X 4  Post vital signs: Reviewed and stable  Last Vitals:  Vitals Value Taken Time  BP    Temp    Pulse    Resp    SpO2      Last Pain:  Vitals:   04/04/21 1147  TempSrc:   PainSc: 0-No pain      Patients Stated Pain Goal: 5 (23/93/59 4090)  Complications: No notable events documented.

## 2021-04-04 NOTE — Op Note (Addendum)
[facility] Patient Name: Jared Griffin Procedure Date: 04/04/2021 11:38 AM MRN: [id number] Date of Birth: 09-01-1961 Attending MD: [name] ,  CSN: [id number] Age: 60 Admit Type: Outpatient Procedure:                Upper GI endoscopy Indications:              Follow-up of esophageal ulcer Providers:                [name], Caprice Kluver, Raphael Gibney,                            Technician Referring MD:              Medicines:                Monitored Anesthesia Care Complications:            No immediate complications. Estimated Blood Loss:     Estimated blood loss: none. Procedure:                Pre-Anesthesia Assessment:                           - Prior to the procedure, a History and Physical                            was performed, and patient medications, allergies                            and sensitivities were reviewed. The patient's                            tolerance of previous anesthesia was reviewed.                           - The risks and benefits of the procedure and the                            sedation options and risks were discussed with the                            patient. All questions were answered and informed                            consent was obtained.                           - ASA Grade Assessment: II - A patient with mild                            systemic disease.                           After obtaining informed consent, the endoscope was                            passed under direct vision. Throughout the  procedure, the patient's blood pressure, pulse, and                            oxygen saturations were monitored continuously. The                            [id number]-[id number] ([id number]) scope was introduced through the                            mouth, and advanced to the second part of duodenum.                            The upper GI endoscopy was accomplished without                             difficulty. The patient tolerated the procedure                            well. Scope In: 11:49:49 AM Scope Out: 11:54:50 AM Total Procedure Duration: 0 hours 5 minutes 1 second  Findings:      The examined esophagus was normal. No ulcer or scar was found.      Multiple 4 to 8 mm sessile polyps with no bleeding were found in the       gastric fundus and in the gastric body. These had a fundic gland polyp       appearance.      The examined duodenum was normal. Impression:               - Normal esophagus. Healed pill esophageal ulcer.                           - Multiple gastric polyps.                           - Normal examined duodenum.                           - No specimens collected. Moderate Sedation:      Per Anesthesia Care Recommendation:           - Discharge patient to home (ambulatory).                           - Resume previous diet.                           - Decrease omeprazole to 40 mg qday Procedure Code(s):        --- Professional ---                           [phone], Esophagogastroduodenoscopy, flexible,                            transoral; diagnostic, including collection of                            specimen(s) by  brushing or washing, when performed                            (separate procedure) Diagnosis Code(s):        --- Professional ---                           K31.7, Polyp of stomach and duodenum                           K22.10, Ulcer of esophagus without bleeding CPT copyright 2019 American Medical Association. All rights reserved. The codes documented in this report are preliminary and upon coder review may  be revised to meet current compliance requirements. [name], MD [name],  04/04/2021 12:00:15 PM This report has been signed electronically. Number of Addenda: 0

## 2021-04-04 NOTE — H&P (Signed)
Jared Griffin is an 60 y.o. male.   Chief Complaint: Acute diarrhea with concern for ulcerative colitis and follow-up of esophageal ulcer HPI: Jared Griffin is a 60 y.o. male with has medical history COPD, CAD s/p stent, diabetes c/b neuropathy , HLD, NASH and IBS-D, who presents for evaluation of acute diarrhea with concern for ulcerative colitis and follow-up esophageal ulcer.  The patient was hospitalized at [facility] on 03/14/2021 after presenting acute onset of diarrhea with multiple bowel movements.  He was found to have significant colitis on the CT scan with report stating there was concern for ulcerative colitis.  The patient received IV antibiotics and was discharged home upon recovery of his mental status and improvement of his bowel movement frequency.  The patient now reports that he is bowel movements have almost normalized, he has 2 soft bowel movements every day and denies any abdominal pain, nausea, vomiting, fever, chills or rectal bleeding/melena.  He also underwent an EGD on 02/17/2021 which showed presence of the following findings  - Scar in the upper third of the esophagus. ?Pill esophagitis (patient on doxycycline) - Normal stomach. - Normal examined duodenum. - No specimens collected.  Due to this he will have a repeat EGD for surveillance of the esophageal ulcer, possibly pill induced.  Past Medical History:  Diagnosis Date   Anxiety    COPD (chronic obstructive pulmonary disease) ([place])    Coronary atherosclerosis of native coronary artery    a. 12/09/2013: s/p PCI with 3.0 x 28 Promus DES to mLAD   Depression    Diabetic neuropathy ([place])    Elbow injury 07/25/2017   Surgery for torn tendon   Essential hypertension    Fatty liver disease, nonalcoholic    GERD (gastroesophageal reflux disease)    Lateral epicondylitis of right elbow    Mixed hyperlipidemia    Obesity    Osgood-Schlatter's disease of right knee    Skin cyst 10/03/2017   Removed  from back   Type 2 diabetes mellitus ([place])     Past Surgical History:  Procedure Laterality Date   BIOPSY  01/21/2019   Procedure: BIOPSY;  Surgeon: [name], MD;  [facility];  Service: Endoscopy;;  gastric bx and gastric polyps   BIOPSY  11/08/2020   Procedure: BIOPSY;  Surgeon: [name], MD;  [facility];  Service: Gastroenterology;;   CARDIAC CATHETERIZATION  2011   CARDIAC CATHETERIZATION N/A 09/20/2015   Procedure: Left Heart Cath and Coronary Angiography;  Surgeon: [name], MD;  Location: [place] [facility];  Service: Cardiovascular;  Laterality: N/A;   CHOLECYSTECTOMY  2003   COLONOSCOPY  06/19/2012   Procedure: COLONOSCOPY;  Surgeon: [name], MD;  [facility];  Service: Endoscopy;  Laterality: N/A;  930   COLONOSCOPY N/A 10/17/2017   Procedure: COLONOSCOPY;  Surgeon: [name], MD;  [facility];  Service: Endoscopy;  Laterality: N/A;  1225   COLONOSCOPY WITH PROPOFOL N/A 11/08/2020   Procedure: COLONOSCOPY WITH PROPOFOL;  Surgeon: [name], MD;  [facility];  Service: Gastroenterology;  Laterality: N/A;  10:45   CYST EXCISION  07/2019   ESOPHAGOGASTRODUODENOSCOPY (EGD) WITH PROPOFOL N/A 01/21/2019   Procedure: ESOPHAGOGASTRODUODENOSCOPY (EGD) WITH PROPOFOL;  Surgeon: [name], MD;  [facility];  Service: Endoscopy;  Laterality: N/A;  10:15am   ESOPHAGOGASTRODUODENOSCOPY (EGD) WITH PROPOFOL N/A 02/17/2021   Procedure: ESOPHAGOGASTRODUODENOSCOPY (EGD) WITH PROPOFOL;  Surgeon:  [name], MD;  [facility];  Service: Gastroenterology;  Laterality: N/A;  10:20   KNEE ARTHROPLASTY Right 1999   LEFT HEART CATH AND CORONARY ANGIOGRAPHY N/A 02/26/2018   Procedure: LEFT HEART CATH AND CORONARY ANGIOGRAPHY;  Surgeon: [name], MD;  Location: [place] [facility];  Service: Cardiovascular;  Laterality: N/A;   LEFT  HEART CATHETERIZATION WITH CORONARY ANGIOGRAM N/A 12/09/2013   Procedure: LEFT HEART CATHETERIZATION WITH CORONARY ANGIOGRAM;  Surgeon: [name], MD;  Location: [facility] C[facility];  Service: Cardiovascular;  Laterality: N/A;   Liver biopsy     POLYPECTOMY  10/17/2017   Procedure: POLYPECTOMY;  Surgeon: [name], MD;  [facility];  Service: Endoscopy;;  ascending colon, splenic flexure,sigmoid x2   POLYPECTOMY  11/08/2020   Procedure: POLYPECTOMY;  Surgeon: [name], MD;  [facility];  Service: Gastroenterology;;   TENNIS ELBOW RELEASE/NIRSCHEL PROCEDURE Right 07/25/2017   Procedure: TENNIS ELBOW RELEASE and debriedment;  Surgeon: [name], MD;  Location: [facility];  Service: Orthopedics;  Laterality: Right;   TONSILLECTOMY  1970's    Family History  Problem Relation Age of Onset   Osteoporosis Mother    Cancer Maternal Aunt    CVA Maternal Grandmother    CVA Maternal Grandfather    Healthy Son    Social History:  reports that he quit smoking about 37 years ago. His smoking use included cigarettes. He started smoking about 48 years ago. He has a 5.00 pack-year smoking history. He quit smokeless tobacco use about 37 years ago.  His smokeless tobacco use included chew. He reports that he does not drink alcohol and does not use drugs.  Allergies:  Allergies  Allergen Reactions   Doxycycline     Severe esophagitis    Medications Prior to Admission  Medication Sig Dispense Refill   gabapentin (NEURONTIN) 300 MG capsule Take 2 qam and 2 in the evening. 120 capsule 5   acetaminophen (TYLENOL) 500 MG tablet Take 1,500 mg by mouth every 8 (eight) hours as needed for moderate pain.     albuterol (VENTOLIN HFA) 108 (90 Base) MCG/ACT inhaler INHALE 2 PUFFS INTO LUNGS EVERY 6 HOURS AS NEEDED FOR WHEEZING AND SHORTNESS OF BREATH. (Patient taking differently: Inhale 2 puffs into the lungs every 6 (six) hours as needed for shortness of breath  or wheezing. INHALE 2 PUFFS INTO LUNGS EVERY 6 HOURS AS NEEDED FOR WHEEZING AND SHORTNESS OF BREATH.) 8.5 g 5   ALPRAZolam (XANAX) 1 MG tablet Take 1 tablet (1 mg total) by mouth 3 (three) times daily as needed for anxiety. 90 tablet 2   aspirin 81 MG EC tablet Take 1 tablet (81 mg total) by mouth daily. 30 tablet 12   atorvastatin (LIPITOR) 40 MG tablet Take 1 tablet (40 mg total) by mouth daily. 90 tablet 1   B-D UF III MINI PEN NEEDLES 31G X 5 MM MISC USE UP TO 4 TIMES DAILY WITH INSULIN AS DIRECTED. 100 each 3   bisoprolol (ZEBETA) 5 MG tablet Take 1 tablet (5 mg total) by mouth daily. 90 tablet 2   cetirizine (ZYRTEC) 10 MG tablet Take 1 tablet (10 mg total) by mouth daily. 30 tablet 0   Continuous Blood Gluc Sensor (FREESTYLE LIBRE 14 DAY SENSOR) MISC Inject 1 each into the skin every 14 (fourteen) days. Use as directed. 2 each 2   dapagliflozin propanediol (FARXIGA) 5 MG TABS tablet Take 5 mg by mouth daily.  dicyclomine (BENTYL) 10 MG capsule Take 1 capsule (10 mg total) by mouth 2 (two) times daily before a meal. 60 capsule 5   Exenatide ER (BYDUREON BCISE) 2 MG/0.85ML AUIJ Inject 2 mg into the skin every Monday.     fluticasone (FLONASE) 50 MCG/ACT nasal spray Place 2 sprays into both nostrils daily. (Patient taking differently: Place 2 sprays into both nostrils daily as needed for allergies.) 16 g 0   fluticasone-salmeterol (ADVAIR HFA) 115-21 MCG/ACT inhaler Inhale 2 puffs into the lungs 2 (two) times daily. 1 Inhaler 5   HUMALOG KWIKPEN 200 UNIT/ML KwikPen Inject 35-40 Units into the skin 3 (three) times daily.     hyoscyamine (LEVSIN SL) 0.125 MG SL tablet Place 1 tablet (0.125 mg total) under the tongue every 4 (four) hours as needed. (Patient taking differently: Place 0.125 mg under the tongue every 4 (four) hours as needed for cramping.) 30 tablet 0   ipratropium (ATROVENT) 0.06 % nasal spray Place 2 sprays into both nostrils in the morning and at bedtime.     Lancets MISC 1 each  by Does not apply route 4 (four) times daily. 150 each 5   losartan (COZAAR) 25 MG tablet TAKE (1) TABLET BY MOUTH ONCE DAILY. (Patient taking differently: Take 25 mg by mouth daily.) 90 tablet 3   metFORMIN (GLUCOPHAGE) 500 MG tablet TAKE 2 TABLETS BY MOUTH TWICE DAILY WITH A MEAL. (Patient taking differently: Take 1,000 mg by mouth 2 (two) times daily with a meal.) 120 tablet 0   nitroGLYCERIN (NITROSTAT) 0.4 MG SL tablet Place 1 tablet (0.4 mg total) under the tongue every 5 (five) minutes as needed. (Patient taking differently: Place 0.4 mg under the tongue every 5 (five) minutes as needed for chest pain.) 25 tablet 3   omeprazole (PRILOSEC) 40 MG capsule Take 1 capsule (40 mg total) by mouth 2 (two) times daily. 90 capsule 3   ondansetron (ZOFRAN) 4 MG tablet Take 4 mg by mouth every 8 (eight) hours as needed for nausea or vomiting.     polyethylene glycol-electrolytes (TRILYTE) 420 g solution Take 4,000 mLs by mouth as directed. 4000 mL 0   ranolazine (RANEXA) 1000 MG SR tablet Take 1 tablet (1,000 mg total) by mouth 2 (two) times daily. 180 tablet 1   sertraline (ZOLOFT) 100 MG tablet Take 1.5 tablets (150 mg total) by mouth daily. 135 tablet 2   sucralfate (CARAFATE) 1 GM/10ML suspension Take 10 mLs (1 g total) by mouth 4 (four) times daily. 420 mL 0   TOUJEO MAX SOLOSTAR 300 UNIT/ML Solostar Pen INJECT 130 UNITS INTO THE SKIN AT BEDTIME. (Patient taking differently: Inject 60 Units into the skin at bedtime.) 12 mL 0   traZODone (DESYREL) 50 MG tablet Take 1 tablet (50 mg total) by mouth at bedtime as needed. (Patient taking differently: Take 50 mg by mouth at bedtime as needed for sleep.) 30 tablet 2    No results found for this or any previous visit (from the past 48 hour(s)). No results found.  Review of Systems  Constitutional: Negative.   HENT: Negative.    Eyes: Negative.   Respiratory: Negative.    Cardiovascular: Negative.   Gastrointestinal: Negative.   Endocrine: Negative.    Genitourinary: Negative.   Musculoskeletal: Negative.   Skin: Negative.   Allergic/Immunologic: Negative.   Neurological: Negative.   Hematological: Negative.   Psychiatric/Behavioral: Negative.     Blood pressure (!) 155/99, pulse 100, temperature 97.9 F (36.6 C), temperature source Oral, resp.  rate 18, SpO2 97 %. Physical Exam  GENERAL: The patient is AO x3, in no acute distress. HEENT: Head is normocephalic and atraumatic. EOMI are intact. Mouth is well hydrated and without lesions. NECK: Supple. No masses LUNGS: Clear to auscultation. No presence of rhonchi/wheezing/rales. Adequate chest expansion HEART: RRR, normal s1 and s2. ABDOMEN: Soft, nontender, no guarding, no peritoneal signs, and nondistended. BS +. No masses. EXTREMITIES: Without any cyanosis, clubbing, rash, lesions or edema. NEUROLOGIC: AOx3, no focal motor deficit. SKIN: no jaundice, no rashes  Assessment/Plan Jared Griffin is a 59 y.o. male with has medical history COPD, CAD s/p stent, diabetes c/b neuropathy , HLD, NASH and IBS-D, who presents for evaluation of acute diarrhea with concern for ulcerative colitis and follow-up esophageal ulcer.  We will proceed with an EGD and colonoscopy.  [name], MD 04/04/2021, 11:42 AM

## 2021-04-04 NOTE — Op Note (Signed)
[facility] Patient Name: Jared Griffin Procedure Date: 04/04/2021 11:58 AM MRN: [id number] Date of Birth: 1961-06-16 Attending MD: [name] ,  CSN: [id number] Age: 60 Admit Type: Outpatient Procedure:                Colonoscopy Indications:              Diarrhea, rule out inflammatory bowel disease Providers:                [name], Caprice Kluver, Raphael Gibney,                            Technician Referring MD:              Medicines:                Monitored Anesthesia Care Complications:            No immediate complications. Estimated Blood Loss:     Estimated blood loss: none. Procedure:                Pre-Anesthesia Assessment:                           - Prior to the procedure, a History and Physical                            was performed, and patient medications, allergies                            and sensitivities were reviewed. The patient's                            tolerance of previous anesthesia was reviewed.                           - The risks and benefits of the procedure and the                            sedation options and risks were discussed with the                            patient. All questions were answered and informed                            consent was obtained.                           - ASA Grade Assessment: II - A patient with mild                            systemic disease.                           After obtaining informed consent, the colonoscope                            was passed under direct vision. Throughout the  procedure, the patient's blood pressure, pulse, and                            oxygen saturations were monitored continuously. The                            [id number]-[id number] ([id number] scope was introduced through                            the anus and advanced to the the terminal ileum.                            The colonoscopy was performed without difficulty.                             The patient tolerated the procedure well. The                            quality of the bowel preparation was inadequate. Scope In: 12:01:21 PM Scope Out: 12:21:00 PM Scope Withdrawal Time: 0 hours 15 minutes 25 seconds  Total Procedure Duration: 0 hours 19 minutes 39 seconds  Findings:      Hemorrhoids were found on perianal exam.      The terminal ileum appeared normal.      A 3 mm polyp was found in the ileocecal valve. The polyp was sessile.       The polyp was removed with a cold snare. Resection was complete, but the       polyp tissue was not retrieved.      There was a medium-sized lipoma, in the transverse colon.      A moderate amount of semi-liquid stool was found in the entire colon,       making visualization difficult.      The exam was otherwise normal throughout the examined colon. No presence       of inflammation in the colon throughout.      Non-bleeding external internal hemorrhoids were found during       retroflexion. The hemorrhoids were small. Impression:               - Preparation of the colon was inadequate.                           - Hemorrhoids found on perianal exam.                           - The examined portion of the ileum was normal.                           - One 3 mm polyp at the ileocecal valve, removed                            with a cold snare. Complete resection. Polyp tissue                            not retrieved.                           -  Medium-sized lipoma in the transverse colon.                           - The exam was otherwise normal throughout the                            examined colon. No presence of inflammation in the                            colon throughout.                           - Stool in the entire examined colon.                           - Non-bleeding external internal hemorrhoids. Moderate Sedation:      Per Anesthesia Care Recommendation:           - Discharge patient to home (ambulatory).                            - Resume previous diet.                           - Repeat colonoscopy in 3 years for surveillance.                            Will need a two day prep.                           - Can take over the counter probiotic daily for one                            month. Procedure Code(s):        --- Professional ---                           [phone], Colonoscopy, flexible; with removal of                            tumor(s), polyp(s), or other lesion(s) by snare                            technique Diagnosis Code(s):        --- Professional ---                           K64.4, Residual hemorrhoidal skin tags                           K64.8, Other hemorrhoids                           K63.5, Polyp of colon                           D17.5, Benign lipomatous neoplasm of  intra-abdominal organs                           R19.7, Diarrhea, unspecified CPT copyright 2019 American Medical Association. All rights reserved. The codes documented in this report are preliminary and upon coder review may  be revised to meet current compliance requirements. [name], MD [name],  04/04/2021 12:32:04 PM This report has been signed electronically. Number of Addenda: 0

## 2021-04-05 DIAGNOSIS — Z79899 Other long term (current) drug therapy: Secondary | ICD-10-CM | POA: Diagnosis not present

## 2021-04-05 DIAGNOSIS — L720 Epidermal cyst: Secondary | ICD-10-CM | POA: Diagnosis not present

## 2021-04-05 DIAGNOSIS — A09 Infectious gastroenteritis and colitis, unspecified: Secondary | ICD-10-CM | POA: Diagnosis not present

## 2021-04-05 DIAGNOSIS — L570 Actinic keratosis: Secondary | ICD-10-CM | POA: Diagnosis not present

## 2021-04-05 DIAGNOSIS — L820 Inflamed seborrheic keratosis: Secondary | ICD-10-CM | POA: Diagnosis not present

## 2021-04-05 DIAGNOSIS — D1801 Hemangioma of skin and subcutaneous tissue: Secondary | ICD-10-CM | POA: Diagnosis not present

## 2021-04-05 DIAGNOSIS — L814 Other melanin hyperpigmentation: Secondary | ICD-10-CM | POA: Diagnosis not present

## 2021-04-05 DIAGNOSIS — D229 Melanocytic nevi, unspecified: Secondary | ICD-10-CM | POA: Diagnosis not present

## 2021-04-06 LAB — COMPREHENSIVE METABOLIC PANEL
ALT: 82 IU/L — ABNORMAL HIGH (ref 0–44)
AST: 67 IU/L — ABNORMAL HIGH (ref 0–40)
Albumin/Globulin Ratio: 2.1 (ref 1.2–2.2)
Albumin: 4.8 g/dL (ref 3.8–4.9)
Alkaline Phosphatase: 63 IU/L (ref 44–121)
BUN/Creatinine Ratio: 8 — ABNORMAL LOW (ref 9–20)
BUN: 9 mg/dL (ref 6–24)
Bilirubin Total: 0.6 mg/dL (ref 0.0–1.2)
CO2: 24 mmol/L (ref 20–29)
Calcium: 9.9 mg/dL (ref 8.7–10.2)
Chloride: 100 mmol/L (ref 96–106)
Creatinine, Ser: 1.08 mg/dL (ref 0.76–1.27)
Globulin, Total: 2.3 g/dL (ref 1.5–4.5)
Glucose: 185 mg/dL — ABNORMAL HIGH (ref 65–99)
Potassium: 4.5 mmol/L (ref 3.5–5.2)
Sodium: 140 mmol/L (ref 134–144)
Total Protein: 7.1 g/dL (ref 6.0–8.5)
eGFR: 79 mL/min/{1.73_m2} (ref >59)

## 2021-04-06 LAB — CBC WITH DIFFERENTIAL/PLATELET
Basophils Absolute: 0.1 10*3/uL (ref 0.0–0.2)
Basos: 1 %
EOS (ABSOLUTE): 0.3 10*3/uL (ref 0.0–0.4)
Eos: 5 %
Hematocrit: 42.2 % (ref 37.5–51.0)
Hemoglobin: 14 g/dL (ref 13.0–17.7)
Immature Grans (Abs): 0 10*3/uL (ref 0.0–0.1)
Immature Granulocytes: 0 %
Lymphocytes Absolute: 1.7 10*3/uL (ref 0.7–3.1)
Lymphs: 32 %
MCH: 26.9 pg (ref 26.6–33.0)
MCHC: 33.2 g/dL (ref 31.5–35.7)
MCV: 81 fL (ref 79–97)
Monocytes Absolute: 0.7 10*3/uL (ref 0.1–0.9)
Monocytes: 12 %
Neutrophils Absolute: 2.7 10*3/uL (ref 1.4–7.0)
Neutrophils: 50 %
Platelets: 235 10*3/uL (ref 150–450)
RBC: 5.21 x10E6/uL (ref 4.14–5.80)
RDW: 13.6 % (ref 11.6–15.4)
WBC: 5.4 10*3/uL (ref 3.4–10.8)

## 2021-04-07 NOTE — Addendum Note (Signed)
Addended by: Dairl Ponder on: 04/07/2021 02:24 PM   Modules accepted: Orders

## 2021-04-07 NOTE — Progress Notes (Signed)
Results discussed with patient. Patient advised per Dr Nicki Reaper : Nurses-CBC looks good.  Hemoglobin good.  Kidney functions look good.  Liver enzymes mildly elevated.  Previous CAT scan when he was in the hospital showed fatty liver.  Best approach is healthy eating regular activity.  Avoid alcohol use.  Patient has follow-up in the fall.  Please have patient do liver profile, hep C antibody, hepatitis B surface antigen, ferritin, TIBC before nextfollow-up visit due to elevated transaminase  Patient verbalized understanding. Blood work ordered in [facility].

## 2021-04-10 ENCOUNTER — Encounter (HOSPITAL_COMMUNITY): Payer: Self-pay | Admitting: Gastroenterology

## 2021-04-11 ENCOUNTER — Other Ambulatory Visit (INDEPENDENT_AMBULATORY_CARE_PROVIDER_SITE_OTHER): Payer: Self-pay | Admitting: Gastroenterology

## 2021-04-11 ENCOUNTER — Telehealth (INDEPENDENT_AMBULATORY_CARE_PROVIDER_SITE_OTHER): Payer: Self-pay

## 2021-04-11 DIAGNOSIS — R197 Diarrhea, unspecified: Secondary | ICD-10-CM

## 2021-04-11 NOTE — Telephone Encounter (Signed)
Patient called today stating Saturday he started having some issues with nausea, no vomiting. Diarrhea started today. He is not running a fever, no dark or bloody stools seen. He states he feels like he did prior to last hospitalization. He has Mid epigastric pain that he describes as a stabbing pain. His omeprazole was decreased to 40 mg daily,but since this began he increased it to twice per day. He is also taking dicyclomine twice per day and has no refills left on Carafate.Please advise.

## 2021-04-11 NOTE — Telephone Encounter (Signed)
Spoke to the patient today, reports having recurrent episodes of watery diarrhea but no abdominal pain.  States he is having recurrent nausea that is mildly improving with the Zofran.  He states that he is still working.  We will continue Zofran and I advised him to take Imodium.  I suspect this may be related to IBS-D episodes but will check stool testing and celiac serology.  Crystal, please send him the lab orders

## 2021-04-12 NOTE — Telephone Encounter (Signed)
Mailed the lab orders to the patient on 04/12/2021.

## 2021-04-17 DIAGNOSIS — D485 Neoplasm of uncertain behavior of skin: Secondary | ICD-10-CM | POA: Diagnosis not present

## 2021-04-18 DIAGNOSIS — R809 Proteinuria, unspecified: Secondary | ICD-10-CM | POA: Diagnosis not present

## 2021-04-18 DIAGNOSIS — E781 Pure hyperglyceridemia: Secondary | ICD-10-CM | POA: Diagnosis not present

## 2021-04-18 DIAGNOSIS — E1140 Type 2 diabetes mellitus with diabetic neuropathy, unspecified: Secondary | ICD-10-CM | POA: Diagnosis not present

## 2021-04-18 DIAGNOSIS — Z794 Long term (current) use of insulin: Secondary | ICD-10-CM | POA: Diagnosis not present

## 2021-04-18 DIAGNOSIS — I2510 Atherosclerotic heart disease of native coronary artery without angina pectoris: Secondary | ICD-10-CM | POA: Diagnosis not present

## 2021-04-18 DIAGNOSIS — E119 Type 2 diabetes mellitus without complications: Secondary | ICD-10-CM | POA: Diagnosis not present

## 2021-04-18 DIAGNOSIS — E1165 Type 2 diabetes mellitus with hyperglycemia: Secondary | ICD-10-CM | POA: Diagnosis not present

## 2021-04-22 DIAGNOSIS — E1165 Type 2 diabetes mellitus with hyperglycemia: Secondary | ICD-10-CM | POA: Diagnosis not present

## 2021-04-28 ENCOUNTER — Other Ambulatory Visit: Payer: Self-pay

## 2021-04-28 ENCOUNTER — Telehealth (INDEPENDENT_AMBULATORY_CARE_PROVIDER_SITE_OTHER): Payer: Medicare Other | Admitting: Psychiatry

## 2021-04-28 ENCOUNTER — Encounter (HOSPITAL_COMMUNITY): Payer: Self-pay | Admitting: Psychiatry

## 2021-04-28 DIAGNOSIS — F321 Major depressive disorder, single episode, moderate: Secondary | ICD-10-CM | POA: Diagnosis not present

## 2021-04-28 MED ORDER — SERTRALINE HCL 100 MG PO TABS: 150.0000 mg | ORAL_TABLET | Freq: Every day | ORAL | 2 refills | Status: DC

## 2021-04-28 MED ORDER — TRAZODONE HCL 50 MG PO TABS: 50.0000 mg | ORAL_TABLET | Freq: Every evening | ORAL | 2 refills | Status: DC | PRN

## 2021-04-28 MED ORDER — ALPRAZOLAM 1 MG PO TABS: 1.0000 mg | ORAL_TABLET | Freq: Three times a day (TID) | ORAL | 2 refills | Status: DC | PRN

## 2021-04-28 NOTE — Progress Notes (Signed)
Virtual Visit via Telephone Note  I connected with Jared Griffin on 04/28/21 at  9:00 AM EDT by telephone and verified that I am speaking with the correct person using two identifiers.  Location: Patient: home Provider: home office   I discussed the limitations, risks, security and privacy concerns of performing an evaluation and management service by telephone and the availability of in person appointments. I also discussed with the patient that there may be a patient responsible charge related to this service. The patient expressed understanding and agreed to proceed.      I discussed the assessment and treatment plan with the patient. The patient was provided an opportunity to ask questions and all were answered. The patient agreed with the plan and demonstrated an understanding of the instructions.   The patient was advised to call back or seek an in-person evaluation if the symptoms worsen or if the condition fails to improve as anticipated.  I provided 15 minutes of non-face-to-face time during this encounter.   [name], MD  [facility] MD/PA/NP OP Progress Note  04/28/2021 9:12 AM Jared Griffin  MRN:  [id number]  Chief Complaint:  Chief Complaint   Anxiety; Depression; Follow-up    HPI: This patient is a 60 year old married white male lives with his wife and  son in [place]. 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  Recently he has been working at [employer] zone but had an arm injury at work and had to quit there as well.  The patient returns for follow-up after 3 months.  Unfortunately he developed colitis over the summer which turned to sepsis and he had to be hospitalized.  It was also thought he might of had pill esophagitis due to doxycycline.  He states that a lot of this is now cleared up.  He is not on any new or different medications.  He has had close follow-up with his GI specialist.  He has lost  about 17 pounds and states that he has lost his taste for a lot of foods particularly spicy foods and dairy.  The patient states however that his mood has been stable.  He denies current depression or suicidal ideation.  He is sleeping well at night and rarely uses the trazodone.  The Xanax continues to help his anxiety. Visit Diagnosis:    ICD-10-CM   1. Moderate single current episode of major depressive disorder (HCC)  F32.1       Past Psychiatric History: none  Past Medical History:  Past Medical History:  Diagnosis Date   Anxiety    COPD (chronic obstructive pulmonary disease) ([place])    Coronary atherosclerosis of native coronary artery    a. 12/09/2013: s/p PCI with 3.0 x 28 Promus DES to mLAD   Depression    Diabetic neuropathy ([place])    Elbow injury 07/25/2017   Surgery for torn tendon   Essential hypertension    Fatty liver disease, nonalcoholic    GERD (gastroesophageal reflux disease)    Lateral epicondylitis of right elbow    Mixed hyperlipidemia    Obesity    Osgood-Schlatter's disease of right knee    Skin cyst 10/03/2017   Removed from back   Type 2 diabetes mellitus ([place])     Past Surgical History:  Procedure Laterality Date   BIOPSY  01/21/2019   Procedure: BIOPSY;  Surgeon: [name], MD;  [facility];  Service: Endoscopy;;  gastric bx and gastric polyps   BIOPSY  11/08/2020   Procedure: BIOPSY;  Surgeon: [name], MD;  [facility];  Service: Gastroenterology;;   CARDIAC CATHETERIZATION  2011   CARDIAC CATHETERIZATION N/A 09/20/2015   Procedure: Left Heart Cath and Coronary Angiography;  Surgeon: [name], MD;  Location: [place] [facility];  Service: Cardiovascular;  Laterality: N/A;   CHOLECYSTECTOMY  2003   COLONOSCOPY  06/19/2012   Procedure: COLONOSCOPY;  Surgeon: [name], MD;  [facility];  Service: Endoscopy;  Laterality: N/A;  930   COLONOSCOPY N/A 10/17/2017   Procedure:  COLONOSCOPY;  Surgeon: [name], MD;  [facility];  Service: Endoscopy;  Laterality: N/A;  1225   COLONOSCOPY WITH PROPOFOL N/A 11/08/2020   Procedure: COLONOSCOPY WITH PROPOFOL;  Surgeon: [name], MD;  [facility];  Service: Gastroenterology;  Laterality: N/A;  10:45   COLONOSCOPY WITH PROPOFOL N/A 04/04/2021   Procedure: COLONOSCOPY WITH PROPOFOL;  Surgeon: [name], MD;  [facility];  Service: Gastroenterology;  Laterality: N/A;   CYST EXCISION  07/2019   ESOPHAGOGASTRODUODENOSCOPY (EGD) WITH PROPOFOL N/A 01/21/2019   Procedure: ESOPHAGOGASTRODUODENOSCOPY (EGD) WITH PROPOFOL;  Surgeon: [name], MD;  [facility];  Service: Endoscopy;  Laterality: N/A;  10:15am   ESOPHAGOGASTRODUODENOSCOPY (EGD) WITH PROPOFOL N/A 02/17/2021   Procedure: ESOPHAGOGASTRODUODENOSCOPY (EGD) WITH PROPOFOL;  Surgeon: [name], MD;  [facility];  Service: Gastroenterology;  Laterality: N/A;  10:20   ESOPHAGOGASTRODUODENOSCOPY (EGD) WITH PROPOFOL N/A 04/04/2021   Procedure: ESOPHAGOGASTRODUODENOSCOPY (EGD) WITH PROPOFOL;  Surgeon: [name], MD;  [facility];  Service: Gastroenterology;  Laterality: N/A;  12:30   KNEE ARTHROPLASTY Right 1999   LEFT HEART CATH AND CORONARY ANGIOGRAPHY N/A 02/26/2018   Procedure: LEFT HEART CATH AND CORONARY ANGIOGRAPHY;  Surgeon: [name], MD;  Location: [place] [facility];  Service: Cardiovascular;  Laterality: N/A;   LEFT HEART CATHETERIZATION WITH CORONARY ANGIOGRAM N/A 12/09/2013   Procedure: LEFT HEART CATHETERIZATION WITH CORONARY ANGIOGRAM;  Surgeon: [name], MD;  Location: [facility];  Service: Cardiovascular;  Laterality: N/A;   Liver biopsy     POLYPECTOMY  10/17/2017   Procedure: POLYPECTOMY;  Surgeon: [name], MD;  [facility];  Service: Endoscopy;;  ascending colon, splenic  flexure,sigmoid x2   POLYPECTOMY  11/08/2020   Procedure: POLYPECTOMY;  Surgeon: [name], MD;  [facility];  Service: Gastroenterology;;   POLYPECTOMY  04/04/2021   Procedure: POLYPECTOMY;  Surgeon: [name], MD;  [facility];  Service: Gastroenterology;;   TENNIS ELBOW RELEASE/NIRSCHEL PROCEDURE Right 07/25/2017   Procedure: TENNIS ELBOW RELEASE and debriedment;  Surgeon: [name], MD;  Location: [facility];  Service: Orthopedics;  Laterality: Right;   TONSILLECTOMY  1970's    Family Psychiatric History: see below  Family History:  Family History  Problem Relation Age of Onset   Osteoporosis Mother    Cancer Maternal Aunt    CVA Maternal Grandmother    CVA Maternal Grandfather    Healthy Son     Social History:  Social History   Socioeconomic History   Marital status: Married    Spouse name: Not on file   Number of children: Not on file   Years of education: Not on file   Highest education level: Not on file  Occupational History   Not  on file  Tobacco Use   Smoking status: Former    Packs/day: 0.50    Years: 10.00    Pack years: 5.00    Types: Cigarettes    Start date: 02/02/1973    Quit date: 09/17/1983    Years since quitting: 37.6   Smokeless tobacco: Former    Types: Chew    Quit date: 09/17/1983  Vaping Use   Vaping Use: Never used  Substance and Sexual Activity   Alcohol use: No    Alcohol/week: 0.0 standard drinks   Drug use: No   Sexual activity: Yes  Other Topics Concern   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    Married with 1 child   Right handed    68 th    Very little caffeine   Social Determinants of Radio broadcast assistant Strain: Not on file  Food Insecurity: Not on file  Transportation Needs: Not on file  Physical Activity: Not on file  Stress: Not on file  Social Connections:  Not on file    Allergies:  Allergies  Allergen Reactions   Doxycycline     Severe esophagitis    Metabolic Disorder Labs: Lab Results  Component Value Date   HGBA1C 6.6 10/18/2020   MPG 211.6 07/12/2018   MPG 197.25 07/23/2017   No results found for: PROLACTIN Lab Results  Component Value Date   CHOL 116 10/18/2018   TRIG 173 (H) 10/18/2018   HDL 30 (L) 10/18/2018   CHOLHDL 3.9 10/18/2018   VLDL UNABLE TO CALCULATE IF TRIGLYCERIDE OVER 400 mg/dL 07/12/2018   LDLCALC 51 10/18/2018   LDLCALC UNABLE TO CALCULATE IF TRIGLYCERIDE OVER 400 mg/dL 07/12/2018   Lab Results  Component Value Date   TSH 0.648 08/20/2019   TSH 0.65 08/20/2019    Therapeutic Level Labs: No results found for: LITHIUM No results found for: VALPROATE No components found for:  CBMZ  Current Medications: Current Outpatient Medications  Medication Sig Dispense Refill   acetaminophen (TYLENOL) 500 MG tablet Take 1,500 mg by mouth every 8 (eight) hours as needed for moderate pain.     albuterol (VENTOLIN HFA) 108 (90 Base) MCG/ACT inhaler INHALE 2 PUFFS INTO LUNGS EVERY 6 HOURS AS NEEDED FOR WHEEZING AND SHORTNESS OF BREATH. (Patient taking differently: Inhale 2 puffs into the lungs every 6 (six) hours as needed for shortness of breath or wheezing. INHALE 2 PUFFS INTO LUNGS EVERY 6 HOURS AS NEEDED FOR WHEEZING AND SHORTNESS OF BREATH.) 8.5 g 5   ALPRAZolam (XANAX) 1 MG tablet Take 1 tablet (1 mg total) by mouth 3 (three) times daily as needed for anxiety. 90 tablet 2   aspirin 81 MG EC tablet Take 1 tablet (81 mg total) by mouth daily. 30 tablet 12   atorvastatin (LIPITOR) 40 MG tablet Take 1 tablet (40 mg total) by mouth daily. 90 tablet 1   B-D UF III MINI PEN NEEDLES 31G X 5 MM MISC USE UP TO 4 TIMES DAILY WITH INSULIN AS DIRECTED. 100 each 3   bisoprolol (ZEBETA) 5 MG tablet Take 1 tablet (5 mg total) by mouth daily. 90 tablet 2   cetirizine (ZYRTEC) 10 MG tablet Take 1 tablet (10 mg total) by mouth  daily. 30 tablet 0   Continuous Blood Gluc Sensor (FREESTYLE LIBRE 14 DAY SENSOR) MISC Inject 1 each into the skin every 14 (fourteen) days. Use as directed.  2 each 2   dapagliflozin propanediol (FARXIGA) 5 MG TABS tablet Take 5 mg by mouth daily.     dicyclomine (BENTYL) 10 MG capsule Take 1 capsule (10 mg total) by mouth 2 (two) times daily before a meal. 60 capsule 5   Exenatide ER (BYDUREON BCISE) 2 MG/0.85ML AUIJ Inject 2 mg into the skin every Monday.     fluticasone (FLONASE) 50 MCG/ACT nasal spray Place 2 sprays into both nostrils daily. (Patient taking differently: Place 2 sprays into both nostrils daily as needed for allergies.) 16 g 0   fluticasone-salmeterol (ADVAIR HFA) 115-21 MCG/ACT inhaler Inhale 2 puffs into the lungs 2 (two) times daily. 1 Inhaler 5   gabapentin (NEURONTIN) 300 MG capsule Take 2 qam and 2 in the evening. 120 capsule 5   HUMALOG KWIKPEN 200 UNIT/ML KwikPen Inject 35-40 Units into the skin 3 (three) times daily.     hyoscyamine (LEVSIN SL) 0.125 MG SL tablet Place 1 tablet (0.125 mg total) under the tongue every 4 (four) hours as needed. 30 tablet 0   ipratropium (ATROVENT) 0.06 % nasal spray Place 2 sprays into both nostrils in the morning and at bedtime.     Lancets MISC 1 each by Does not apply route 4 (four) times daily. 150 each 5   losartan (COZAAR) 25 MG tablet TAKE (1) TABLET BY MOUTH ONCE DAILY. (Patient taking differently: Take 25 mg by mouth daily.) 90 tablet 3   metFORMIN (GLUCOPHAGE) 500 MG tablet TAKE 2 TABLETS BY MOUTH TWICE DAILY WITH A MEAL. (Patient taking differently: Take 1,000 mg by mouth 2 (two) times daily with a meal.) 120 tablet 0   nitroGLYCERIN (NITROSTAT) 0.4 MG SL tablet Place 1 tablet (0.4 mg total) under the tongue every 5 (five) minutes as needed. (Patient taking differently: Place 0.4 mg under the tongue every 5 (five) minutes as needed for chest pain.) 25 tablet 3   omeprazole (PRILOSEC) 40 MG capsule Take 1 capsule (40 mg total) by  mouth daily. 90 capsule 3   ondansetron (ZOFRAN) 4 MG tablet Take 4 mg by mouth every 8 (eight) hours as needed for nausea or vomiting.     ranolazine (RANEXA) 1000 MG SR tablet Take 1 tablet (1,000 mg total) by mouth 2 (two) times daily. 180 tablet 1   sertraline (ZOLOFT) 100 MG tablet Take 1.5 tablets (150 mg total) by mouth daily. 135 tablet 2   sucralfate (CARAFATE) 1 GM/10ML suspension Take 10 mLs (1 g total) by mouth 4 (four) times daily. 420 mL 0   TOUJEO MAX SOLOSTAR 300 UNIT/ML Solostar Pen INJECT 130 UNITS INTO THE SKIN AT BEDTIME. (Patient taking differently: Inject 60 Units into the skin at bedtime.) 12 mL 0   traZODone (DESYREL) 50 MG tablet Take 1 tablet (50 mg total) by mouth at bedtime as needed. 30 tablet 2   No current facility-administered medications for this visit.     Musculoskeletal: Strength & Muscle Tone: within normal limits Gait & Station: normal Patient leans: N/A  Psychiatric Specialty Exam: Review of Systems  Musculoskeletal:  Positive for arthralgias.  All other systems reviewed and are negative.  There were no vitals taken for this visit.There is no height or weight on file to calculate BMI.  General Appearance: NA  Eye Contact:  NA  Speech:  Clear and Coherent  Volume:  Normal  Mood:  Euthymic  Affect:  Appropriate and Congruent  Thought Process:  Goal Directed  Orientation:  Full (Time, Place, and Person)  Thought  Content: WDL   Suicidal Thoughts:  No  Homicidal Thoughts:  No  Memory:  Immediate;   Good Recent;   Good Remote;   Good  Judgement:  Good  Insight:  Good  Psychomotor Activity:  Normal  Concentration:  Concentration: Good and Attention Span: Good  Recall:  Good  Fund of Knowledge: Good  Language: Good  Akathisia:  No  Handed:  Right  AIMS (if indicated): not done  Assets:  Communication Skills Desire for Improvement Physical Health Resilience Social Support Talents/Skills  ADL's:  Intact  Cognition: WNL  Sleep:  Good    Screenings: GAD-7    Flowsheet Row Office Visit from 08/08/2020 in [place] Office Visit from 08/05/2018 in [place]  Total GAD-7 Score 3 5      PHQ2-9    Flowsheet Row Video Visit from 04/28/2021 in [place] Video Visit from 01/26/2021 in [place] Office Visit from 12/12/2020 in [place] Office Visit from 12/07/2020 in [place] Office Visit from 08/08/2020 in [place]  PHQ-2 Total Score 0 1 0 0 4  PHQ-9 Total Score -- -- -- -- 10      Flowsheet Row Video Visit from 04/28/2021 in [facility] (Discharged) from 04/04/2021 in [facility] from 03/31/2021 in [facility] No Risk No Risk No Risk        Assessment and Plan: This patient is a 59 year old male with a history of depression anxiety and chronic pain.  He seems to be doing better now that he has gotten past his recent hospitalizations.  He will continue Xanax 1 mg up to 3 times daily as needed for anxiety, Zoloft 150 mg daily for depression and occasionally trazodone 50 mg at bedtime as needed for sleep.  He will return to see me in 3 months   [name], MD 04/28/2021, 9:12 AM

## 2021-05-04 DIAGNOSIS — L600 Ingrowing nail: Secondary | ICD-10-CM | POA: Diagnosis not present

## 2021-05-04 DIAGNOSIS — M79675 Pain in left toe(s): Secondary | ICD-10-CM | POA: Diagnosis not present

## 2021-05-04 DIAGNOSIS — M79672 Pain in left foot: Secondary | ICD-10-CM | POA: Diagnosis not present

## 2021-05-08 ENCOUNTER — Telehealth: Payer: Self-pay | Admitting: Family Medicine

## 2021-05-08 ENCOUNTER — Other Ambulatory Visit: Payer: Self-pay | Admitting: Cardiology

## 2021-05-08 DIAGNOSIS — E785 Hyperlipidemia, unspecified: Secondary | ICD-10-CM

## 2021-05-08 DIAGNOSIS — Z79899 Other long term (current) drug therapy: Secondary | ICD-10-CM

## 2021-05-08 DIAGNOSIS — I10 Essential (primary) hypertension: Secondary | ICD-10-CM

## 2021-05-08 DIAGNOSIS — Z125 Encounter for screening for malignant neoplasm of prostate: Secondary | ICD-10-CM

## 2021-05-08 NOTE — Telephone Encounter (Signed)
Last labs completed 04/05/21 CMP CBC Active labs from 04/05/21 TIBC, Ferritin, Hep B, Hep C and Hepatic. Please advise. Thank you

## 2021-05-08 NOTE — Telephone Encounter (Signed)
Lab orders placed. Pt sees Dr.Averneni in [place] for DM. Pt verbalized understanding. Will mail both sets of labs to patient.

## 2021-05-08 NOTE — Telephone Encounter (Signed)
Patient needing labs for physical 10/5

## 2021-05-08 NOTE — Telephone Encounter (Signed)
He does need to get the blood work from July drawn. When he goes for this blood work he needs to request both Please clarify is he seeing someone else for his diabetes? If he is seeing [place] he will need to do A1c and urine ACR If he is seeing a specialist they will do that He will also need the following PSA, metabolic 7, lipid, liver  Please make sure patient is aware that the lab typically will just draw the blood that has most recently been ordered he also needs the blood work from July drawn as well

## 2021-05-10 ENCOUNTER — Telehealth: Payer: Self-pay | Admitting: *Deleted

## 2021-05-10 DIAGNOSIS — R197 Diarrhea, unspecified: Secondary | ICD-10-CM | POA: Diagnosis not present

## 2021-05-10 NOTE — Chronic Care Management (AMB) (Signed)
  Chronic Care Management   Note  05/10/2021 Name: Worthy Boschert MRN: [id number] DOB: March 05, 1961  Imer Foxworth is a 60 y.o. year old male who is a primary care patient of Luking, Elayne Snare, MD. I reached out to [name] by phone today in response to a referral sent by Mr. Daxter Paule Beckley's PCP, Dr. Wolfgang Phoenix.      Mr. Knack was given information about Chronic Care Management services today including:  CCM service includes personalized support from designated clinical staff supervised by his physician, including individualized plan of care and coordination with other care providers 24/7 contact phone numbers for assistance for urgent and routine care needs. Service will only be billed when office clinical staff spend 20 minutes or more in a month to coordinate care. Only one practitioner may furnish and bill the service in a calendar month. The patient may stop CCM services at any time (effective at the end of the month) by phone call to the office staff. The patient will be responsible for cost sharing (co-pay) of up to 20% of the service fee (after annual deductible is met).  Patient agreed to services and verbal consent obtained.   Follow up plan: Telephone appointment with care management team member scheduled for:05/15/21  Hadlie Gipson  Care Guide, Embedded Care Coordination [place]  Care Management  Direct Dial: [phone]

## 2021-05-11 ENCOUNTER — Encounter (INDEPENDENT_AMBULATORY_CARE_PROVIDER_SITE_OTHER): Payer: Self-pay

## 2021-05-11 DIAGNOSIS — R197 Diarrhea, unspecified: Secondary | ICD-10-CM | POA: Diagnosis not present

## 2021-05-11 DIAGNOSIS — A048 Other specified bacterial intestinal infections: Secondary | ICD-10-CM | POA: Diagnosis not present

## 2021-05-11 DIAGNOSIS — K529 Noninfective gastroenteritis and colitis, unspecified: Secondary | ICD-10-CM | POA: Diagnosis not present

## 2021-05-11 DIAGNOSIS — A09 Infectious gastroenteritis and colitis, unspecified: Secondary | ICD-10-CM | POA: Diagnosis not present

## 2021-05-11 LAB — CELIAC DISEASE PANEL
(tTG) Ab, IgA: <1 U/mL
(tTG) Ab, IgG: <1 U/mL
Gliadin IgA: <1 U/mL
Gliadin IgG: <1 U/mL
Immunoglobulin A: 419 mg/dL — ABNORMAL HIGH (ref 47–310)

## 2021-05-13 ENCOUNTER — Encounter (INDEPENDENT_AMBULATORY_CARE_PROVIDER_SITE_OTHER): Payer: Self-pay

## 2021-05-15 ENCOUNTER — Telehealth: Payer: Medicare Other

## 2021-05-18 LAB — GASTROINTESTINAL PATHOGEN PANEL PCR
C. difficile Tox A/B, PCR: NOT DETECTED
Campylobacter, PCR: NOT DETECTED
Cryptosporidium, PCR: NOT DETECTED
E coli (ETEC) LT/ST PCR: NOT DETECTED
E coli (STEC) stx1/stx2, PCR: NOT DETECTED
E coli 0157, PCR: DETECTED — AB
Giardia lamblia, PCR: NOT DETECTED
Norovirus, PCR: NOT DETECTED
Rotavirus A, PCR: NOT DETECTED
Salmonella, PCR: NOT DETECTED
Shigella, PCR: NOT DETECTED

## 2021-05-18 LAB — OVA AND PARASITE EXAMINATION
CONCENTRATE RESULT:: NONE SEEN
MICRO NUMBER:: 12292167
SPECIMEN QUALITY:: ADEQUATE
TRICHROME RESULT:: NONE SEEN

## 2021-05-18 LAB — FECAL FAT, QUALITATIVE: FECAL FAT, QUALITATIVE: NORMAL

## 2021-05-18 LAB — PANCREATIC ELASTASE, FECAL: Pancreatic Elastase-1, Stool: >500 mcg/g

## 2021-05-23 DIAGNOSIS — L03031 Cellulitis of right toe: Secondary | ICD-10-CM | POA: Diagnosis not present

## 2021-05-23 DIAGNOSIS — E1165 Type 2 diabetes mellitus with hyperglycemia: Secondary | ICD-10-CM | POA: Diagnosis not present

## 2021-05-23 DIAGNOSIS — M79671 Pain in right foot: Secondary | ICD-10-CM | POA: Diagnosis not present

## 2021-05-23 DIAGNOSIS — L600 Ingrowing nail: Secondary | ICD-10-CM | POA: Diagnosis not present

## 2021-05-23 DIAGNOSIS — M79674 Pain in right toe(s): Secondary | ICD-10-CM | POA: Diagnosis not present

## 2021-05-25 ENCOUNTER — Ambulatory Visit (INDEPENDENT_AMBULATORY_CARE_PROVIDER_SITE_OTHER): Payer: Medicare Other | Admitting: Gastroenterology

## 2021-05-25 DIAGNOSIS — I10 Essential (primary) hypertension: Secondary | ICD-10-CM | POA: Diagnosis not present

## 2021-05-25 DIAGNOSIS — E785 Hyperlipidemia, unspecified: Secondary | ICD-10-CM | POA: Diagnosis not present

## 2021-05-25 DIAGNOSIS — R7401 Elevation of levels of liver transaminase levels: Secondary | ICD-10-CM | POA: Diagnosis not present

## 2021-05-25 DIAGNOSIS — Z79899 Other long term (current) drug therapy: Secondary | ICD-10-CM | POA: Diagnosis not present

## 2021-05-26 ENCOUNTER — Ambulatory Visit (INDEPENDENT_AMBULATORY_CARE_PROVIDER_SITE_OTHER): Payer: Medicare Other | Admitting: Pharmacist

## 2021-05-26 DIAGNOSIS — E1159 Type 2 diabetes mellitus with other circulatory complications: Secondary | ICD-10-CM

## 2021-05-26 DIAGNOSIS — E785 Hyperlipidemia, unspecified: Secondary | ICD-10-CM

## 2021-05-26 DIAGNOSIS — I10 Essential (primary) hypertension: Secondary | ICD-10-CM

## 2021-05-26 DIAGNOSIS — I779 Disorder of arteries and arterioles, unspecified: Secondary | ICD-10-CM

## 2021-05-26 LAB — LIPID PANEL
Chol/HDL Ratio: 5.9 ratio — ABNORMAL HIGH (ref 0.0–5.0)
Cholesterol, Total: 172 mg/dL (ref 100–199)
HDL: 29 mg/dL — ABNORMAL LOW (ref >39)
LDL Chol Calc (NIH): 71 mg/dL (ref 0–99)
Triglycerides: 455 mg/dL — ABNORMAL HIGH (ref 0–149)
VLDL Cholesterol Cal: 72 mg/dL — ABNORMAL HIGH (ref 5–40)

## 2021-05-26 LAB — HEPATIC FUNCTION PANEL
ALT: 51 IU/L — ABNORMAL HIGH (ref 0–44)
ALT: 54 IU/L — ABNORMAL HIGH (ref 0–44)
AST: 36 IU/L (ref 0–40)
AST: 39 IU/L (ref 0–40)
Albumin: 4.5 g/dL (ref 3.8–4.9)
Albumin: 4.8 g/dL (ref 3.8–4.9)
Alkaline Phosphatase: 66 IU/L (ref 44–121)
Alkaline Phosphatase: 62 IU/L (ref 44–121)
Bilirubin Total: 0.5 mg/dL (ref 0.0–1.2)
Bilirubin Total: 0.5 mg/dL (ref 0.0–1.2)
Bilirubin, Direct: 0.18 mg/dL (ref 0.00–0.40)
Bilirubin, Direct: 0.19 mg/dL (ref 0.00–0.40)
Total Protein: 7.2 g/dL (ref 6.0–8.5)
Total Protein: 7.2 g/dL (ref 6.0–8.5)

## 2021-05-26 LAB — IRON AND TIBC
Iron Saturation: 13 % — ABNORMAL LOW (ref 15–55)
Iron: 60 ug/dL (ref 38–169)
Total Iron Binding Capacity: 468 ug/dL — ABNORMAL HIGH (ref 250–450)
UIBC: 408 ug/dL — ABNORMAL HIGH (ref 111–343)

## 2021-05-26 LAB — PSA: Prostate Specific Ag, Serum: 0.4 ng/mL (ref 0.0–4.0)

## 2021-05-26 LAB — FERRITIN: Ferritin: 23 ng/mL — ABNORMAL LOW (ref 30–400)

## 2021-05-26 LAB — HEPATITIS C ANTIBODY: Hep C Virus Ab: <0.1 s/co ratio (ref 0.0–0.9)

## 2021-05-26 LAB — BASIC METABOLIC PANEL
BUN/Creatinine Ratio: 15 (ref 9–20)
BUN: 15 mg/dL (ref 6–24)
CO2: 21 mmol/L (ref 20–29)
Calcium: 9.7 mg/dL (ref 8.7–10.2)
Chloride: 99 mmol/L (ref 96–106)
Creatinine, Ser: 0.98 mg/dL (ref 0.76–1.27)
Glucose: 212 mg/dL — ABNORMAL HIGH (ref 65–99)
Potassium: 4.2 mmol/L (ref 3.5–5.2)
Sodium: 137 mmol/L (ref 134–144)
eGFR: 89 mL/min/{1.73_m2} (ref >59)

## 2021-05-26 LAB — HEPATITIS B SURFACE ANTIGEN: Hepatitis B Surface Ag: NEGATIVE

## 2021-05-26 NOTE — Chronic Care Management (AMB) (Signed)
Chronic Care Management Pharmacy Note  05/26/2021 Name:  Jared Griffin MRN:  [id number] DOB:  03/14/1961  Recommendations made from today's visit:  TG above goal of <150 per 2020 AACE/ACE guidelines. Given elevated triglycerides, consider addition of fenofibrate +/- fish oil. Will discuss with PCP.  Subjective: Jared Griffin is an 60 y.o. year old male who is a primary patient of Luking, Elayne Snare, MD.  The CCM team was consulted for assistance with disease management and care coordination needs.    Engaged with patient by telephone for initial visit in response to provider referral for pharmacy case management and/or care coordination services.   Consent to Services:  The patient was given the following information about Chronic Care Management services today, agreed to services, and gave verbal consent: 1. CCM service includes personalized support from designated clinical staff supervised by the primary care provider, including individualized plan of care and coordination with other care providers 2. 24/7 contact phone numbers for assistance for urgent and routine care needs. 3. Service will only be billed when office clinical staff spend 20 minutes or more in a month to coordinate care. 4. Only one practitioner may furnish and bill the service in a calendar month. 5.The patient may stop CCM services at any time (effective at the end of the month) by phone call to the office staff. 6. The patient will be responsible for cost sharing (co-pay) of up to 20% of the service fee (after annual deductible is met). Patient agreed to services and consent obtained.  Patient Care Team: [name], MD as PCP - General (Family Medicine) Satira Sark, MD as PCP - Cardiology (Cardiology) Allene Pyo, DPM as Consulting Physician (Podiatry) Cloria Spring, MD as Consulting Physician ([place]) Beryle Lathe, [facility] (Pharmacist)  Objective:  Lab Results   Component Value Date   CREATININE 0.98 05/25/2021   CREATININE 1.08 04/05/2021   CREATININE 1.13 03/14/2021    Lab Results  Component Value Date   HGBA1C 6.6 10/18/2020   Last diabetic Eye exam:  Lab Results  Component Value Date/Time   HMDIABEYEEXA No Retinopathy 08/05/2019 12:00 AM    Last diabetic Foot exam: No results found for: HMDIABFOOTEX      Component Value Date/Time   CHOL 172 05/25/2021 0958   TRIG 455 (H) 05/25/2021 0958   HDL 29 (L) 05/25/2021 0958   CHOLHDL 5.9 (H) 05/25/2021 0958   CHOLHDL 6.4 07/12/2018 0713   VLDL UNABLE TO CALCULATE IF TRIGLYCERIDE OVER 400 mg/dL 07/12/2018 0713   LDLCALC 71 05/25/2021 0958    Hepatic Function Latest Ref Rng & Units 05/25/2021 05/25/2021 04/05/2021  Total Protein 6.0 - 8.5 g/dL 7.2 7.2 7.1  Albumin 3.8 - 4.9 g/dL 4.5 4.8 4.8  AST 0 - 40 IU/L 36 39 67(H)  ALT 0 - 44 IU/L 51(H) 54(H) 82(H)  Alk Phosphatase 44 - 121 IU/L 66 62 63  Total Bilirubin 0.0 - 1.2 mg/dL 0.5 0.5 0.6  Bilirubin, Direct 0.00 - 0.40 mg/dL 0.18 0.19 -    Lab Results  Component Value Date/Time   TSH 0.648 08/20/2019 09:37 AM   TSH 0.65 08/20/2019 12:00 AM   TSH 0.525 07/12/2018 07:13 AM   TSH 0.458 04/13/2017 08:48 AM   FREET4 1.35 08/20/2019 09:37 AM    CBC Latest Ref Rng & Units 04/05/2021 03/14/2021 08/17/2020  WBC 3.4 - 10.8 x10E3/uL 5.4 7.4 7.0  Hemoglobin 13.0 - 17.7 g/dL 14.0 15.0 15.1  Hematocrit 37.5 - 51.0 %  42.2 47.2 46.4  Platelets 150 - 450 x10E3/uL 235 180 211    Lab Results  Component Value Date/Time   VD25OH 18.6 (L) 08/20/2019 09:37 AM   VD25OH 18.6 08/20/2019 12:00 AM    Clinical ASCVD: No  The 10-year ASCVD risk score (Arnett DK, et al., 2019) is: 23.5%   Values used to calculate the score:     Age: 89 years     Sex: Male     Is Non-Hispanic African American: No     Diabetic: Yes     Tobacco smoker: No     Systolic Blood Pressure: 694 mmHg     Is BP treated: Yes     HDL Cholesterol: 29 mg/dL     Total Cholesterol:  172 mg/dL    Social History   Tobacco Use  Smoking Status Former   Packs/day: 0.50   Years: 10.00   Pack years: 5.00   Types: Cigarettes   Start date: 02/02/1973   Quit date: 09/17/1983   Years since quitting: 37.7  Smokeless Tobacco Former   Types: Chew   Quit date: 09/17/1983   BP Readings from Last 3 Encounters:  04/04/21 101/67  03/21/21 122/76  03/14/21 (!) 145/78   Pulse Readings from Last 3 Encounters:  04/04/21 95  03/14/21 (!) 106  02/17/21 82   Wt Readings from Last 3 Encounters:  03/31/21 263 lb 6.4 oz (119.5 kg)  03/21/21 263 lb 6.4 oz (119.5 kg)  03/14/21 265 lb (120.2 kg)    Assessment: Review of patient past medical history, allergies, medications, health status, including review of consultants reports, laboratory and other test data, was performed as part of comprehensive evaluation and provision of chronic care management services.   SDOH:  (Social Determinants of Health) assessments and interventions performed:    CCM Care Plan  Allergies  Allergen Reactions   Doxycycline     Severe esophagitis    Medications Reviewed Today     Reviewed by Beryle Lathe, [facility] (Pharmacist) on 05/26/21 at 1539  Med List Status: <None>   Medication Order Taking? Sig Documenting Provider Last Dose Status Informant  acetaminophen (TYLENOL) 500 MG tablet [id number] Yes Take 1,000 mg by mouth every 8 (eight) hours as needed for moderate pain. [provider] Taking Active Self  albuterol (VENTOLIN HFA) 108 (90 Base) MCG/ACT inhaler [id number] Yes INHALE 2 PUFFS INTO LUNGS EVERY 6 HOURS AS NEEDED FOR WHEEZING AND SHORTNESS OF BREATH.  Patient taking differently: Inhale 2 puffs into the lungs every 6 (six) hours as needed for shortness of breath or wheezing. INHALE 2 PUFFS INTO LUNGS EVERY 6 HOURS AS NEEDED FOR WHEEZING AND SHORTNESS OF BREATH.   Mikey Kirschner, MD Taking Active            Med Note Waldo Laine, Gwenyth Allegra   Fri May 26, 2021  3:35 PM)     ALPRAZolam Duanne Moron) 1 MG tablet [id number] Yes Take 1 tablet (1 mg total) by mouth 3 (three) times daily as needed for anxiety. Cloria Spring, MD Taking Active   aspirin 81 MG EC tablet [id number] Yes Take 1 tablet (81 mg total) by mouth daily. [name], MD Taking Active Self  atorvastatin (LIPITOR) 40 MG tablet [id number] Yes TAKE ONE TABLET (40MG TOTAL) BY MOUTH DAILY Satira Sark, MD Taking Active   B-D UF III MINI PEN NEEDLES 31G X 5 MM MISC [id number]  USE UP TO 4 TIMES DAILY WITH INSULIN AS DIRECTED. Cassandria Anger, MD  Active Self  bisoprolol (ZEBETA) 5 MG tablet [id number] Yes Take 1 tablet (5 mg total) by mouth daily. Satira Sark, MD Taking Active Self  cetirizine (ZYRTEC) 10 MG tablet [id number] Yes Take 1 tablet (10 mg total) by mouth daily. Stacey Drain [place], PA-C Taking Active Self  Continuous Blood Gluc Sensor (FREESTYLE LIBRE 14 DAY SENSOR) [place] [id number] Yes Inject 1 each into the skin every 14 (fourteen) days. Use as directed. Cassandria Anger, MD Taking Active Self  dapagliflozin propanediol (FARXIGA) 5 MG TABS tablet [id number] Yes Take 5 mg by mouth daily. [provider] Taking Active Self           Med Note Wilmon Pali, MELISSA R   Wed Sep 14, 2020  9:57 AM)    dicyclomine (BENTYL) 10 MG capsule [id number] Yes Take 1 capsule (10 mg total) by mouth 2 (two) times daily before a meal. Montez Morita, Quillian Quince, MD Taking Active Self  Exenatide ER (BYDUREON BCISE) 2 MG/0.85ML [place] [id number] Yes Inject 2 mg into the skin every Monday. [provider] Taking Active Self  fluticasone (FLONASE) 50 MCG/ACT nasal spray [id number] Yes Place 2 sprays into both nostrils daily.  Patient taking differently: Place 2 sprays into both nostrils daily as needed for allergies.   Stacey Drain [place], PA-C Taking Active            Med Note Jilda Roche A   Wed Nov 02, 2020  1:33 PM)    fluticasone-salmeterol (ADVAIR [facility]) 115-21 MCG/ACT inhaler [id number] Yes Inhale  2 puffs into the lungs 2 (two) times daily. Mikey Kirschner, MD Taking Active Self  gabapentin (NEURONTIN) 300 MG capsule [id number] Yes Take 2 qam and 2 in the evening. [name], MD Taking Active   HUMALOG KWIKPEN 200 UNIT/ML KwikPen [id number] Yes Inject 30-35 Units into the skin 3 (three) times daily. [provider] Taking Active Self  hyoscyamine (LEVSIN SL) 0.125 MG SL tablet [id number] No Place 1 tablet (0.125 mg total) under the tongue every 4 (four) hours as needed.  Patient not taking: Reported on 05/26/2021   Adron Bene Not Taking Active   ipratropium (ATROVENT) 0.06 % nasal spray [id number] Yes Place 2 sprays into both nostrils in the morning and at bedtime. [provider] Taking Active Self  Lancets [place] [id number]  1 each by Does not apply route 4 (four) times daily. Cassandria Anger, MD  Active Self  losartan (COZAAR) 25 MG tablet [id number] Yes TAKE (1) TABLET BY MOUTH ONCE DAILY.  Patient taking differently: Take 25 mg by mouth daily.   Satira Sark, MD Taking Active   metFORMIN (GLUCOPHAGE) 500 MG tablet [id number] Yes TAKE 2 TABLETS BY MOUTH TWICE DAILY WITH A MEAL.  Patient taking differently: Take 1,000 mg by mouth 2 (two) times daily with a meal.   Nida, Marella Chimes, MD Taking Active   nitroGLYCERIN (NITROSTAT) 0.4 MG SL tablet [id number] No Place 1 tablet (0.4 mg total) under the tongue every 5 (five) minutes as needed.  Patient not taking: Reported on 05/26/2021   Herminio Commons, MD Not Taking Active   omeprazole (PRILOSEC) 40 MG capsule [id number] Yes Take 1 capsule (40 mg total) by mouth daily. Montez Morita, Quillian Quince, MD Taking Active   ondansetron [facility]) 4 MG tablet [id number] Yes Take 4 mg by mouth every 8 (eight) hours as needed for nausea or vomiting. [provider] Taking Active Self  ranolazine (RANEXA) 1000 MG SR tablet [id number] Yes Take 1 tablet (1,000 mg  total) by mouth 2 (two) times daily. Satira Sark, MD Taking Active Self  sertraline (ZOLOFT) 100 MG tablet [id number] Yes Take 1.5 tablets (150 mg total) by mouth daily. Cloria Spring, MD Taking Active   sucralfate (CARAFATE) 1 GM/10ML suspension [id number] No Take 10 mLs (1 g total) by mouth 4 (four) times daily.  Patient not taking: Reported on 05/26/2021   [name], MD Not Taking Active Self  TOUJEO MAX SOLOSTAR 300 UNIT/ML Solostar Pen [id number] Yes INJECT 130 UNITS INTO THE SKIN AT BEDTIME.  Patient taking differently: Inject 40-50 Units into the skin at bedtime.   Cassandria Anger, MD Taking Active            Med Note Waldo Laine, Gwenyth Allegra   Fri May 26, 2021  3:35 PM)    traZODone (DESYREL) 50 MG tablet [id number]  Take 1 tablet (50 mg total) by mouth at bedtime as needed. Cloria Spring, MD  Active             Patient Active Problem List   Diagnosis Date Noted   Orthostatic hypotension 12/07/2020   IBS (irritable bowel syndrome) 11/17/2020   Chronic diarrhea 08/17/2020   NASH (nonalcoholic steatohepatitis) 08/17/2020   Elevated LFTs 08/17/2020   Tremor of both hands 05/06/2020   Bilateral occipital neuralgia 05/06/2020   Abdominal pain, chronic, epigastric 01/13/2019   Anxiety 01/04/2019   Radicular pain of left lower extremity 12/22/2018   Moderate persistent asthma 10/25/2018   Dizziness 07/12/2018   Nonintractable headache    Abnormal nuclear stress test    Aftercare following surgery 07/25/17 08/13/2017   Hx of colonic polyps 08/07/2017   Lateral epicondylitis of right elbow    Cervical nerve root impingement 12/09/2015   Generalized anxiety disorder 09/27/2015   Chest pain 09/20/2015   Pain in the chest    Depression 01/24/2015   Esophageal reflux 01/24/2015   Preoperative cardiovascular examination 06/28/2014   DM type 2 causing vascular disease ([place])    Mixed hyperlipidemia    Obesity    Intermediate coronary syndrome ([place]) 12/09/2013   Lumbago 05/21/2012   Essential  hypertension, benign 12/28/2009   CAD S/P LAD DES March 2015 12/28/2009   Hyperlipidemia LDL goal <70 11/25/2009   Accelerating angina ([place]) 11/25/2009   DM 11/24/2009   ABDOMINAL PAIN, HX OF 11/24/2009    Immunization History  Administered Date(s) Administered   Influenza,inj,Quad PF,6+ Mos 07/11/2016, 08/02/2017, 08/05/2018, 08/10/2019, 08/08/2020   Influenza-Unspecified 06/18/2013, 06/17/2014, 07/06/2015   Td 01/28/2014    Conditions to be addressed/monitored: CAD, HTN, HLD, and DMII  Care Plan : Medication Management  Updates made by Beryle Lathe, [place] since 05/26/2021 12:00 AM     Problem: HTN, CAD/HLD, T2DM   Priority: High  Onset Date: 05/26/2021     Long-Range Goal: Disease Progression Prevention   Start Date: 05/26/2021  Expected End Date: 08/24/2021  This Visit's Progress: On track  Priority: High  Note:   Current Barriers:  Unable to achieve control of hyperlipidemia  Pharmacist Clinical Goal(s):  Over the next 90 days, patient will Achieve control of hyperlipidemia as evidenced by improved LDL and improved triglycerides through collaboration with PharmD and provider.   Interventions: 1:1 collaboration with [name], MD regarding development and update of comprehensive plan of care as evidenced by provider attestation and co-signature Inter-disciplinary care team collaboration (see longitudinal plan of care) Comprehensive medication review performed; medication list updated in electronic medical record  Type 2 Diabetes (Followed  by Dr. Garnet Koyanagi @ [facility]): Controlled; Most recent A1c at goal of <7% per ADA guidelines Current medications: metformin 1,000 mg by mouth twice daily with meals, exenatide ER (Bydureon) 2 mg subcutaneously weekly on Monday, dapagliflozin (Farxiga) 5 mg by mouth every morning, and insulin glargine (Toujeo) 40-50 units subcutaneously once daily and insulin lispro (Humalog) 30-35 units subcutaneously 5-15  minutes before each meal Intolerances: none Taking medications as directed: yes Side effects thought to be attributed to current medication regimen: no Hypoglycemia prevention: none but will recommend Current meal patterns: not discussed today Current exercise: not discussed today On a statin: yes On aspirin 81 mg daily: yes Last microalbumin: 15.7 (2020); on an ACEi/ARB: yes Last eye exam: unknown Last foot exam: unknown Pneumonia vaccine: unknown Influenza vaccine:  needs this fall Current glucose readings:  not discussed today but will discuss at future visits The following education was provided: Introduction to diabetes basic facts Instructed to monitor blood sugars continuously with continuous glucose monitor  Encouraged regular aerobic exercise with a goal of 30 minutes five times per week (150 minutes per week) Continue current management per endocrinology as above. Continue to monitor for signs or symptoms of hypoglycemia Consider prescribing glucagon in case of emergency hypoglycemia   Hypertension: Blood pressure under good control. Blood pressure is at goal of <130/80 mmHg per 2017 AHA/ACC guidelines. Current medications: losartan 25 mg by mouth once daily and bisoprolol 5 mg by mouth once daily Intolerances: none Taking medications as directed: yes Side effects thought to be attributed to current medication regimen: no Current home blood pressure: not discussed today but will discuss at future visits Continue losartan 25 mg by mouth once daily and bisoprolol 5 mg by mouth once daily Encourage dietary sodium restriction/DASH diet Recommend regular aerobic exercise Recommend home blood pressure monitoring to discuss at next visit  Coronary artery disease s/p DES 2015/Hyperlipidemia/Angina (Followed by Dr. [name]): Uncontrolled LDL 71 which is near goal of <70 due to very high risk given 10-year risk >20% per 2020 AACE/ACE guidelines  TG above goal of <150 per 2020  AACE/ACE guidelines. Does not smoke currently. Only smoked a little bit when he was a teenager.  Current medications: atorvastatin 40 mg by mouth once daily, aspirin 81 mg by mouth daily, losartan 25 mg by mouth daily, bisoprolol 5 mg by mouth daily, ranolazine 1,000 mg by mouth twice daily, and nitroglycerin as needed for chest pain Intolerances: none Taking medications as directed: yes Side effects thought to be attributed to current medication regimen: no Continue statin, aspirin, beta blocker, angiotensin receptor blocker, ranolazine, and nitroglycerin as above Recommend regular aerobic exercise Given elevated triglycerides, consider addition of fenofibrate +/- fish oil  Patient Goals/Self-Care Activities Over the next 90 days, patient will:  Take medications as prescribed Check blood sugar continuously with continuous glucose monitor, document, and provide at future appointments Check blood pressure at least once daily, document, and provide at future appointments Engage in dietary modifications by fewer sweetened foods & beverages  Follow Up Plan: Telephone follow up appointment with care management team member scheduled for: 06/30/21      Medication Assistance: None required.  Patient affirms current coverage meets needs.  Patient's preferred pharmacy is:  [facility], [address] [place] [address] Phone: [phone] Fax: [phone]  Follow Up:  Patient agrees to Care Plan and Follow-up.  Plan: Telephone follow up appointment with care management team member scheduled for:  06/30/21  Kennon Holter, PharmD Clinical Pharmacist [place] [phone]

## 2021-05-26 NOTE — Patient Instructions (Signed)
Jared Griffin,  It was great to talk to you today! Since your triglyceride level was elevated, I will discuss a few options for treatment with Dr. Wolfgang Phoenix. We may send in an prescription to your pharmacy.   Please call me with any questions or concerns.   Visit Information   PATIENT GOALS:   Goals Addressed             This Visit's Progress    Medication Management       Patient Goals/Self-Care Activities Over the next 90 days, patient will:  Take medications as prescribed Check blood sugar continuously with continuous glucose monitor, document, and provide at future appointments Check blood pressure at least once daily, document, and provide at future appointments Engage in dietary modifications by fewer sweetened foods & beverages        Consent to CCM Services: Jared Griffin was given information about Chronic Care Management services including:  CCM service includes personalized support from designated clinical staff supervised by his physician, including individualized plan of care and coordination with other care providers 24/7 contact phone numbers for assistance for urgent and routine care needs. Service will only be billed when office clinical staff spend 20 minutes or more in a month to coordinate care. Only one practitioner may furnish and bill the service in a calendar month. The patient may stop CCM services at any time (effective at the end of the month) by phone call to the office staff. The patient will be responsible for cost sharing (co-pay) of up to 20% of the service fee (after annual deductible is met).  Patient agreed to services and verbal consent obtained.   Patient verbalizes understanding of instructions provided today and agrees to view in [place].   Telephone follow up appointment with care management team member scheduled for:06/30/21  Kennon Holter, PharmD Clinical Pharmacist [place] [phone]  CLINICAL  CARE PLAN: Patient Care Plan: Medication Management     Problem Identified: HTN, CAD/HLD, T2DM   Priority: High  Onset Date: 05/26/2021     Long-Range Goal: Disease Progression Prevention   Start Date: 05/26/2021  Expected End Date: 08/24/2021  This Visit's Progress: On track  Priority: High  Note:   Current Barriers:  Unable to achieve control of hyperlipidemia  Pharmacist Clinical Goal(s):  Over the next 90 days, patient will Achieve control of hyperlipidemia as evidenced by improved LDL and improved triglycerides through collaboration with PharmD and provider.   Interventions: 1:1 collaboration with [name], MD regarding development and update of comprehensive plan of care as evidenced by provider attestation and co-signature Inter-disciplinary care team collaboration (see longitudinal plan of care) Comprehensive medication review performed; medication list updated in electronic medical record  Type 2 Diabetes (Followed by [facility] Garnet [facility]agi @ [facility]): Controlled; Most recent A1c at goal of <7% per ADA guidelines Current medications: metformin 1,000 mg by mouth twice daily with meals, exenatide ER (Bydureon) 2 mg subcutaneously weekly on Monday, dapagliflozin (Farxiga) 5 mg by mouth every morning, and insulin glargine (Toujeo) 40-50 units subcutaneously once daily and insulin lispro (Humalog) 30-35 units subcutaneously 5-15 minutes before each meal Intolerances: none Taking medications as directed: yes Side effects thought to be attributed to current medication regimen: no Hypoglycemia prevention: none but will recommend Current meal patterns: not discussed today Current exercise: not discussed today On a statin: yes On aspirin 81 mg daily: yes Last microalbumin: 15.7 (2020); on an ACEi/ARB: yes Last eye exam: unknown Last foot exam: unknown Pneumonia  vaccine: unknown Influenza vaccine:  needs this fall Current glucose readings:  not discussed today  but will discuss at future visits The following education was provided: Introduction to diabetes basic facts Instructed to monitor blood sugars continuously with continuous glucose monitor  Encouraged regular aerobic exercise with a goal of 30 minutes five times per week (150 minutes per week) Continue current management per endocrinology as above. Continue to monitor for signs or symptoms of hypoglycemia Consider prescribing glucagon in case of emergency hypoglycemia   Hypertension: Blood pressure under good control. Blood pressure is at goal of <130/80 mmHg per 2017 AHA/ACC guidelines. Current medications: losartan 25 mg by mouth once daily and bisoprolol 5 mg by mouth once daily Intolerances: none Taking medications as directed: yes Side effects thought to be attributed to current medication regimen: no Current home blood pressure: not discussed today but will discuss at future visits Continue losartan 25 mg by mouth once daily and bisoprolol 5 mg by mouth once daily Encourage dietary sodium restriction/DASH diet Recommend regular aerobic exercise Recommend home blood pressure monitoring to discuss at next visit  Coronary artery disease s/p DES 2015/Hyperlipidemia/Angina (Followed by Dr. [name]): Uncontrolled LDL 71 which is near goal of <70 due to very high risk given 10-year risk >20% per 2020 AACE/ACE guidelines  TG above goal of <150 per 2020 AACE/ACE guidelines. Does not smoke currently. Only smoked a little bit when he was a teenager.  Current medications: atorvastatin 40 mg by mouth once daily, aspirin 81 mg by mouth daily, losartan 25 mg by mouth daily, bisoprolol 5 mg by mouth daily, ranolazine 1,000 mg by mouth twice daily, and nitroglycerin as needed for chest pain Intolerances: none Taking medications as directed: yes Side effects thought to be attributed to current medication regimen: no Continue statin, aspirin, beta blocker, angiotensin receptor blocker, ranolazine,  and nitroglycerin as above Recommend regular aerobic exercise Given elevated triglycerides, consider addition of fenofibrate +/- fish oil  Patient Goals/Self-Care Activities Over the next 90 days, patient will:  Take medications as prescribed Check blood sugar continuously with continuous glucose monitor, document, and provide at future appointments Check blood pressure at least once daily, document, and provide at future appointments Engage in dietary modifications by fewer sweetened foods & beverages  Follow Up Plan: Telephone follow up appointment with care management team member scheduled for: 06/30/21

## 2021-05-27 ENCOUNTER — Telehealth: Payer: Self-pay | Admitting: Family Medicine

## 2021-05-27 NOTE — Telephone Encounter (Signed)
Left message for patient to call back and schedule Medicare Annual Wellness Visit (AWV) on  Saturday Sept 17,2022 to do by phone or video 8-12  If unable to do Saturday, please get scheduled virtually/telephone one day during the week  No History of AWV eligible as of 01/15/2021  Please schedule at any time with [facility]-Nurse Health Advisor.      40 Minutes appointment   Any questions, please call me at [phone]

## 2021-05-29 NOTE — Addendum Note (Signed)
Addended by: Dairl Ponder on: 05/29/2021 11:18 AM   Modules accepted: Orders

## 2021-05-31 ENCOUNTER — Encounter (INDEPENDENT_AMBULATORY_CARE_PROVIDER_SITE_OTHER): Payer: Self-pay

## 2021-05-31 ENCOUNTER — Other Ambulatory Visit (INDEPENDENT_AMBULATORY_CARE_PROVIDER_SITE_OTHER): Payer: Self-pay | Admitting: Gastroenterology

## 2021-05-31 MED ORDER — HYOSCYAMINE SULFATE 0.125 MG SL SUBL: 0.1250 mg | SUBLINGUAL_TABLET | SUBLINGUAL | 3 refills | Status: DC | PRN

## 2021-05-31 NOTE — Addendum Note (Signed)
Addended by: [name] A on: 05/31/2021 08:13 PM   Modules accepted: Level of Service

## 2021-05-31 NOTE — Progress Notes (Signed)
Prescription of levsin sent

## 2021-05-31 NOTE — Telephone Encounter (Signed)
Hi Jared Griffin,  I am sorry you are still having issues with this. Per Dr. Colman Cater note on 05/15/2021, he spoke with you regarding the results of the [facility] and had advised that if you are having abdominal cramping you should take the Levsin, but avoid the Antidiarrheals. If you are still having issues please reach out to your primary care doctor as there is no provider in the office today. Hope you feel better soon!

## 2021-06-01 ENCOUNTER — Telehealth (INDEPENDENT_AMBULATORY_CARE_PROVIDER_SITE_OTHER): Payer: Self-pay | Admitting: *Deleted

## 2021-06-01 NOTE — Telephone Encounter (Signed)
Insurance denied hyoscymaine. Do you want to change med?  Converse pharm.

## 2021-06-02 ENCOUNTER — Ambulatory Visit: Payer: Medicare Other | Admitting: Pharmacist

## 2021-06-02 ENCOUNTER — Other Ambulatory Visit (INDEPENDENT_AMBULATORY_CARE_PROVIDER_SITE_OTHER): Payer: Self-pay | Admitting: Gastroenterology

## 2021-06-02 DIAGNOSIS — G8929 Other chronic pain: Secondary | ICD-10-CM

## 2021-06-02 DIAGNOSIS — E1159 Type 2 diabetes mellitus with other circulatory complications: Secondary | ICD-10-CM

## 2021-06-02 DIAGNOSIS — I779 Disorder of arteries and arterioles, unspecified: Secondary | ICD-10-CM

## 2021-06-02 DIAGNOSIS — E785 Hyperlipidemia, unspecified: Secondary | ICD-10-CM

## 2021-06-02 DIAGNOSIS — I10 Essential (primary) hypertension: Secondary | ICD-10-CM

## 2021-06-02 DIAGNOSIS — E781 Pure hyperglyceridemia: Secondary | ICD-10-CM

## 2021-06-02 MED ORDER — DICYCLOMINE HCL 10 MG PO CAPS: 10.0000 mg | ORAL_CAPSULE | Freq: Three times a day (TID) | ORAL | 3 refills | Status: DC

## 2021-06-02 MED ORDER — FISH OIL 1200 MG PO CAPS: 2.0000 | ORAL_CAPSULE | Freq: Two times a day (BID) | ORAL | Status: DC

## 2021-06-02 NOTE — Patient Instructions (Addendum)
Jared Griffin,  Your triglycerides are high. Please start taking over the counter fish oil 2 capsules by mouth twice daily. We may need to increase this in the future but we will start off with this amount.   Please call me with any questions or concerns.   Visit Information  PATIENT GOALS:  Goals Addressed             This Visit's Progress    Medication Management       Patient Goals/Self-Care Activities Over the next 90 days, patient will:  Take medications as prescribed Check blood sugar continuously with continuous glucose monitor, document, and provide at future appointments Check blood pressure at least once daily, document, and provide at future appointments Engage in dietary modifications by fewer sweetened foods & beverages         Patient verbalizes understanding of instructions provided today and agrees to view in [place].   Telephone follow up appointment with care management team member scheduled for:06/30/21  Kennon Holter, PharmD Clinical Pharmacist [place] [phone]

## 2021-06-02 NOTE — Chronic Care Management (AMB) (Signed)
Chronic Care Management Pharmacy Note  06/02/2021 Name:  Jared Griffin MRN:  [id number] DOB:  Mar 14, 1961  Summary:  Triglycerides well above goal of <150 per 2020 AACE/ACE guidelines After discussion with PCP, will treat elevated triglycerides with fish oil instead of fenofibrate at this time given concern for increased risk of myalgias with fibrate + statin Would prefer for patient to have prescription strength fish oil such as Vascepa (icosapent ethyl) or Lovaza (omega-3 ethyl esters); however, these agent were determined to be $100 per month on the patient's insurance after investigation.  In order to achieve similar dose of EPA to Vascepa (2g twice daily), patient would need to take 4-8 capsules twice daily of an over the counter fish oil depending on the brand. Given high pill burden, will have patient initiate 2 capsules twice daily and re-check lipid panel in 4-12 weeks to determine if further increase is necessary to decrease triglycerides to goal. Patient verbalized understanding.   Subjective: Jared Griffin is an 60 y.o. year old male who is a primary patient of Luking, Elayne Snare, MD.  The CCM team was consulted for assistance with disease management and care coordination needs.    Engaged with patient's wife [name]) by telephone for follow up visit in response to provider referral for pharmacy case management and/or care coordination services.   Consent to Services:  The patient was given information about Chronic Care Management services, agreed to services, and gave verbal consent prior to initiation of services.  Please see initial visit note for detailed documentation.   Patient Care Team: [name], MD as PCP - General (Family Medicine) Satira Sark, MD as PCP - Cardiology (Cardiology) Allene Pyo, DPM as Consulting Physician (Podiatry) Cloria Spring, MD as Consulting Physician ([place]) Beryle Lathe, [facility]  (Pharmacist)  Objective:  Lab Results  Component Value Date   CREATININE 0.98 05/25/2021   CREATININE 1.08 04/05/2021   CREATININE 1.13 03/14/2021    Lab Results  Component Value Date   HGBA1C 6.6 10/18/2020   Last diabetic Eye exam:  Lab Results  Component Value Date/Time   HMDIABEYEEXA No Retinopathy 08/05/2019 12:00 AM    Last diabetic Foot exam: No results found for: HMDIABFOOTEX      Component Value Date/Time   CHOL 172 05/25/2021 0958   TRIG 455 (H) 05/25/2021 0958   HDL 29 (L) 05/25/2021 0958   CHOLHDL 5.9 (H) 05/25/2021 0958   CHOLHDL 6.4 07/12/2018 0713   VLDL UNABLE TO CALCULATE IF TRIGLYCERIDE OVER 400 mg/dL 07/12/2018 0713   LDLCALC 71 05/25/2021 0958    Hepatic Function Latest Ref Rng & Units 05/25/2021 05/25/2021 04/05/2021  Total Protein 6.0 - 8.5 g/dL 7.2 7.2 7.1  Albumin 3.8 - 4.9 g/dL 4.5 4.8 4.8  AST 0 - 40 IU/L 36 39 67(H)  ALT 0 - 44 IU/L 51(H) 54(H) 82(H)  Alk Phosphatase 44 - 121 IU/L 66 62 63  Total Bilirubin 0.0 - 1.2 mg/dL 0.5 0.5 0.6  Bilirubin, Direct 0.00 - 0.40 mg/dL 0.18 0.19 -    Lab Results  Component Value Date/Time   TSH 0.648 08/20/2019 09:37 AM   TSH 0.65 08/20/2019 12:00 AM   TSH 0.525 07/12/2018 07:13 AM   TSH 0.458 04/13/2017 08:48 AM   FREET4 1.35 08/20/2019 09:37 AM    CBC Latest Ref Rng & Units 04/05/2021 03/14/2021 08/17/2020  WBC 3.4 - 10.8 x10E3/uL 5.4 7.4 7.0  Hemoglobin 13.0 - 17.7 g/dL 14.0 15.0 15.1  Hematocrit  37.5 - 51.0 % 42.2 47.2 46.4  Platelets 150 - 450 x10E3/uL 235 180 211    Lab Results  Component Value Date/Time   VD25OH 18.6 (L) 08/20/2019 09:37 AM   VD25OH 18.6 08/20/2019 12:00 AM    Clinical ASCVD: No  The 10-year ASCVD risk score (Arnett DK, et al., 2019) is: 23.5%   Values used to calculate the score:     Age: 60 years     Sex: Male     Is Non-Hispanic African American: No     Diabetic: Yes     Tobacco smoker: No     Systolic Blood Pressure: 509 mmHg     Is BP treated: Yes     HDL  Cholesterol: 29 mg/dL     Total Cholesterol: 172 mg/dL    Social History   Tobacco Use  Smoking Status Former   Packs/day: 0.50   Years: 10.00   Pack years: 5.00   Types: Cigarettes   Start date: 02/02/1973   Quit date: 09/17/1983   Years since quitting: 37.7  Smokeless Tobacco Former   Types: Chew   Quit date: 09/17/1983   BP Readings from Last 3 Encounters:  04/04/21 101/67  03/21/21 122/76  03/14/21 (!) 145/78   Pulse Readings from Last 3 Encounters:  04/04/21 95  03/14/21 (!) 106  02/17/21 82   Wt Readings from Last 3 Encounters:  03/31/21 263 lb 6.4 oz (119.5 kg)  03/21/21 263 lb 6.4 oz (119.5 kg)  03/14/21 265 lb (120.2 kg)    Assessment: Review of patient past medical history, allergies, medications, health status, including review of consultants reports, laboratory and other test data, was performed as part of comprehensive evaluation and provision of chronic care management services.   SDOH:  (Social Determinants of Health) assessments and interventions performed:    CCM Care Plan  Allergies  Allergen Reactions   Doxycycline     Severe esophagitis    Medications Reviewed Today     Reviewed by Beryle Lathe, [facility] (Pharmacist) on 06/02/21 at 1410  Med List Status: <None>   Medication Order Taking? Sig Documenting Provider Last Dose Status Informant  acetaminophen (TYLENOL) 500 MG tablet [id number] Yes Take 1,000 mg by mouth every 8 (eight) hours as needed for moderate pain. [provider] Taking Active Self  albuterol (VENTOLIN HFA) 108 (90 Base) MCG/ACT inhaler [id number] Yes INHALE 2 PUFFS INTO LUNGS EVERY 6 HOURS AS NEEDED FOR WHEEZING AND SHORTNESS OF BREATH.  Patient taking differently: Inhale 2 puffs into the lungs every 6 (six) hours as needed for shortness of breath or wheezing. INHALE 2 PUFFS INTO LUNGS EVERY 6 HOURS AS NEEDED FOR WHEEZING AND SHORTNESS OF BREATH.   Mikey Kirschner, MD Taking Active            Med Note  Waldo Laine, Gwenyth Allegra   Fri May 26, 2021  3:35 PM)    ALPRAZolam Duanne Moron) 1 MG tablet [id number] Yes Take 1 tablet (1 mg total) by mouth 3 (three) times daily as needed for anxiety. Cloria Spring, MD Taking Active   aspirin 81 MG EC tablet [id number] Yes Take 1 tablet (81 mg total) by mouth daily. [name], MD Taking Active Self  atorvastatin (LIPITOR) 40 MG tablet [id number] Yes TAKE ONE TABLET (40MG TOTAL) BY MOUTH DAILY Satira Sark, MD Taking Active   B-D UF III MINI PEN NEEDLES 31G X 5 MM MISC [id number]  USE UP TO 4 TIMES DAILY WITH INSULIN AS DIRECTED.  Cassandria Anger, MD  Active Self  bisoprolol (ZEBETA) 5 MG tablet [id number] Yes Take 1 tablet (5 mg total) by mouth daily. Satira Sark, MD Taking Active Self  cetirizine (ZYRTEC) 10 MG tablet [id number] Yes Take 1 tablet (10 mg total) by mouth daily. Stacey Drain, [place], PA-C Taking Active Self  Continuous Blood Gluc Sensor (FREESTYLE LIBRE 14 DAY SENSOR) [place] [id number]  Inject 1 each into the skin every 14 (fourteen) days. Use as directed. Cassandria Anger, MD  Active Self  dapagliflozin propanediol (FARXIGA) 5 MG TABS tablet [id number] Yes Take 5 mg by mouth daily. [provider] Taking Active Self           Med Note Wilmon Pali, MELISSA R   Wed Sep 14, 2020  9:57 AM)    dicyclomine (BENTYL) 10 MG capsule [id number] Yes Take 1 capsule (10 mg total) by mouth 3 (three) times daily before meals. Gabriel Rung, NP Taking Active   Exenatide ER (BYDUREON BCISE) 2 MG/0.85ML AUIJ [id number] Yes Inject 2 mg into the skin every Monday. [provider] Taking Active Self  fluticasone (FLONASE) 50 MCG/ACT nasal spray [id number] Yes Place 2 sprays into both nostrils daily.  Patient taking differently: Place 2 sprays into both nostrils daily as needed for allergies.   Stacey Drain [place], PA-C Taking Active            Med Note Jilda Roche A   Wed Nov 02, 2020  1:33 PM)    fluticasone-salmeterol (ADVAIR [facility]) 115-21  MCG/ACT inhaler [id number] Yes Inhale 2 puffs into the lungs 2 (two) times daily. Mikey Kirschner, MD Taking Active Self  gabapentin (NEURONTIN) 300 MG capsule [id number] Yes Take 2 qam and 2 in the evening. [name], MD Taking Active   HUMALOG KWIKPEN 200 UNIT/ML KwikPen [id number] Yes Inject 30-35 Units into the skin 3 (three) times daily. [provider] Taking Active Self  hyoscyamine (LEVSIN SL) 0.125 MG SL tablet [id number] Yes Place 1 tablet (0.125 mg total) under the tongue every 4 (four) hours as needed. Gabriel Rung, NP Taking Active   ipratropium (ATROVENT) 0.06 % nasal spray [id number] Yes Place 2 sprays into both nostrils in the morning and at bedtime. [provider] Taking Active Self  Lancets [place] [id number]  1 each by Does not apply route 4 (four) times daily. Cassandria Anger, MD  Active Self  losartan (COZAAR) 25 MG tablet [id number] Yes TAKE (1) TABLET BY MOUTH ONCE DAILY.  Patient taking differently: Take 25 mg by mouth daily.   Satira Sark, MD Taking Active   metFORMIN (GLUCOPHAGE) 500 MG tablet [id number] Yes TAKE 2 TABLETS BY MOUTH TWICE DAILY WITH A MEAL.  Patient taking differently: Take 1,000 mg by mouth 2 (two) times daily with a meal.   Nida, Marella Chimes, MD Taking Active   methocarbamol (ROBAXIN) 500 MG tablet [id number] Yes Take 500 mg by mouth every 8 (eight) hours as needed for muscle spasms. [provider] Taking Active   nitroGLYCERIN (NITROSTAT) 0.4 MG SL tablet [id number] No Place 1 tablet (0.4 mg total) under the tongue every 5 (five) minutes as needed.  Patient not taking: No sig reported   Herminio Commons, MD Not Taking Active   omeprazole (PRILOSEC) 40 MG capsule [id number] Yes Take 1 capsule (40 mg total) by mouth daily. Montez Morita, Quillian Quince, MD Taking Active   ondansetron [facility]) 4 MG tablet [id number] Yes Take 4 mg by mouth every 8 (eight) hours as needed for nausea  or vomiting. [provider] Taking Active Self  ranolazine (RANEXA) 1000 MG SR tablet [id number] Yes Take 1 tablet (1,000 mg total) by mouth 2 (two) times daily. Satira Sark, MD Taking Active Self  sertraline (ZOLOFT) 100 MG tablet [id number] Yes Take 1.5 tablets (150 mg total) by mouth daily. Cloria Spring, MD Taking Active   sucralfate (CARAFATE) 1 GM/10ML suspension [id number] No Take 10 mLs (1 g total) by mouth 4 (four) times daily.  Patient not taking: No sig reported   Montez Morita, Quillian Quince, MD Not Taking Active   TOUJEO MAX SOLOSTAR 300 UNIT/ML Solostar Pen [id number] Yes INJECT 130 UNITS INTO THE SKIN AT BEDTIME.  Patient taking differently: Inject 40-50 Units into the skin at bedtime.   Cassandria Anger, MD Taking Active            Med Note Waldo Laine, Gwenyth Allegra   Fri May 26, 2021  3:35 PM)    traZODone (DESYREL) 50 MG tablet [id number] Yes Take 1 tablet (50 mg total) by mouth at bedtime as needed. Cloria Spring, MD Taking Active             Patient Active Problem List   Diagnosis Date Noted   Orthostatic hypotension 12/07/2020   IBS (irritable bowel syndrome) 11/17/2020   Chronic diarrhea 08/17/2020   NASH (nonalcoholic steatohepatitis) 08/17/2020   Elevated LFTs 08/17/2020   Tremor of both hands 05/06/2020   Bilateral occipital neuralgia 05/06/2020   Abdominal pain, chronic, epigastric 01/13/2019   Anxiety 01/04/2019   Radicular pain of left lower extremity 12/22/2018   Moderate persistent asthma 10/25/2018   Dizziness 07/12/2018   Nonintractable headache    Abnormal nuclear stress test    Aftercare following surgery 07/25/17 08/13/2017   Hx of colonic polyps 08/07/2017   Lateral epicondylitis of right elbow    Cervical nerve root impingement 12/09/2015   Generalized anxiety disorder 09/27/2015   Chest pain 09/20/2015   Pain in the chest    Depression 01/24/2015   Esophageal reflux 01/24/2015   Preoperative cardiovascular examination 06/28/2014   DM type 2 causing  vascular disease ([place])    Mixed hyperlipidemia    Obesity    Intermediate coronary syndrome ([place]) 12/09/2013   Lumbago 05/21/2012   Essential hypertension, benign 12/28/2009   CAD S/P LAD DES March 2015 12/28/2009   Hyperlipidemia LDL goal <70 11/25/2009   Accelerating angina ([place]) 11/25/2009   DM 11/24/2009   ABDOMINAL PAIN, HX OF 11/24/2009    Immunization History  Administered Date(s) Administered   Influenza,inj,Quad PF,6+ Mos 07/11/2016, 08/02/2017, 08/05/2018, 08/10/2019, 08/08/2020   Influenza-Unspecified 06/18/2013, 06/17/2014, 07/06/2015   Td 01/28/2014    Conditions to be addressed/monitored: CAD, HTN, HLD, Hypertriglyceridemia, and DMII  Care Plan : Medication Management  Updates made by Beryle Lathe, [place] since 06/02/2021 12:00 AM     Problem: HTN, CAD/HLD, T2DM   Priority: High  Onset Date: 05/26/2021     Long-Range Goal: Disease Progression Prevention   Start Date: 05/26/2021  Expected End Date: 08/24/2021  Recent Progress: On track  Priority: High  Note:   Current Barriers:  Unable to achieve control of hyperlipidemia  Pharmacist Clinical Goal(s):  Over the next 90 days, patient will Achieve control of hyperlipidemia as evidenced by improved LDL and improved triglycerides through collaboration with PharmD and provider.   Interventions: 1:1 collaboration with [name], MD regarding development and update of comprehensive plan of care as evidenced by provider attestation and co-signature Inter-disciplinary care team  collaboration (see longitudinal plan of care) Comprehensive medication review performed; medication list updated in electronic medical record  Type 2 Diabetes (Followed by Dr. Garnet Koyanagi @ [facility]): Controlled; Most recent A1c at goal of <7% per ADA guidelines Current medications: metformin 1,000 mg by mouth twice daily with meals, exenatide ER (Bydureon) 2 mg subcutaneously weekly on Monday, dapagliflozin  (Farxiga) 5 mg by mouth every morning, and insulin glargine (Toujeo) 40-50 units subcutaneously once daily and insulin lispro (Humalog) 30-35 units subcutaneously 5-15 minutes before each meal Intolerances: none Taking medications as directed: yes Side effects thought to be attributed to current medication regimen: no Hypoglycemia prevention: none but will recommend Current meal patterns: not discussed today Current exercise: not discussed today On a statin: yes On aspirin 81 mg daily: yes Last microalbumin: 15.7 (2020); on an ACEi/ARB: yes Last eye exam: unknown Last foot exam: unknown Pneumonia vaccine: unknown Influenza vaccine:  needs this fall Current glucose readings:  not discussed today but will discuss at future visits The following education was provided: Introduction to diabetes basic facts Instructed to monitor blood sugars continuously with continuous glucose monitor  Encouraged regular aerobic exercise with a goal of 30 minutes five times per week (150 minutes per week) Continue current management per endocrinology as above. Continue to monitor for signs or symptoms of hypoglycemia Consider prescribing glucagon in case of emergency hypoglycemia   Hypertension: Blood pressure under good control. Blood pressure is at goal of <130/80 mmHg per 2017 AHA/ACC guidelines. Current medications: losartan 25 mg by mouth once daily and bisoprolol 5 mg by mouth once daily Intolerances: none Taking medications as directed: yes Side effects thought to be attributed to current medication regimen: no Current home blood pressure: not discussed today but will discuss at future visits Continue losartan 25 mg by mouth once daily and bisoprolol 5 mg by mouth once daily Encourage dietary sodium restriction/DASH diet Recommend regular aerobic exercise Recommend home blood pressure monitoring to discuss at next visit  Coronary artery disease s/p DES 2015/Hyperlipidemia/Angina (Followed by Dr.  [name]): Uncontrolled.  LDL 71 which is near goal of <70 due to very high risk given 10-year risk >20% per 2020 AACE/ACE guidelines  Triglycerides well above goal of <150 per 2020 AACE/ACE guidelines Does not smoke currently. Only smoked a little bit when he was a teenager.  Current medications: atorvastatin 40 mg by mouth once daily, aspirin 81 mg by mouth daily, losartan 25 mg by mouth daily, bisoprolol 5 mg by mouth daily, ranolazine 1,000 mg by mouth twice daily, and nitroglycerin as needed for chest pain Intolerances: none Taking medications as directed: yes Side effects thought to be attributed to current medication regimen: no Continue statin, aspirin, beta blocker, angiotensin receptor blocker, ranolazine, and nitroglycerin as above Recommend regular aerobic exercise After discussion with PCP, will treat elevated triglycerides with fish oil instead of fenofibrate at this time given concern for increased risk of myalgias with fibrate + statin.  Would prefer for patient to have prescription strength fish oil such as Vascepa (icosapent ethyl) or Lovaza (omega-3 ethyl esters); however, these agent are $100 per month on the patient's insurance.  In order to achieve similar dose of EPA to Vascepa (2g twice daily), patient would need to take 4-8 capsules twice daily of an over the counter fish oil depending on the brand. Given high pill burden, will have patient initiate 2 capsules twice daily and re-check lipid panel in 4-12 weeks to determine if further increase is necessary to decrease triglycerides to  goal   Patient Goals/Self-Care Activities Over the next 90 days, patient will:  Take medications as prescribed Check blood sugar continuously with continuous glucose monitor, document, and provide at future appointments Check blood pressure at least once daily, document, and provide at future appointments Engage in dietary modifications by fewer sweetened foods & beverages  Follow Up Plan:  Telephone follow up appointment with care management team member scheduled for: 06/30/21      Medication Assistance: None required.  Patient affirms current coverage meets needs.  Patient's preferred pharmacy is:  [place], [address] [place] [address] Phone: [phone] Fax: [phone]  Follow Up:  Patient agrees to Care Plan and Follow-up.  Plan: Telephone follow up appointment with care management team member scheduled for:  06/30/21  Kennon Holter, PharmD Clinical Pharmacist [place] [phone]

## 2021-06-02 NOTE — Telephone Encounter (Signed)
I called and discussed with pt and he verbalized understanding. States he is feeling better.

## 2021-06-14 ENCOUNTER — Telehealth: Payer: Medicare Other | Admitting: Physician Assistant

## 2021-06-14 DIAGNOSIS — B9689 Other specified bacterial agents as the cause of diseases classified elsewhere: Secondary | ICD-10-CM

## 2021-06-14 DIAGNOSIS — J019 Acute sinusitis, unspecified: Secondary | ICD-10-CM | POA: Diagnosis not present

## 2021-06-14 MED ORDER — AMOXICILLIN-POT CLAVULANATE 875-125 MG PO TABS: 1.0000 | ORAL_TABLET | Freq: Two times a day (BID) | ORAL | 0 refills | Status: DC

## 2021-06-14 NOTE — Progress Notes (Signed)
I have spent 5 minutes in review of e-visit questionnaire, review and updating patient chart, medical decision making and response to patient.   Cleatus Gabriel Cody Quency Tober, PA-C    

## 2021-06-14 NOTE — Progress Notes (Signed)

## 2021-06-16 ENCOUNTER — Encounter (HOSPITAL_COMMUNITY): Payer: Self-pay | Admitting: Hematology and Oncology

## 2021-06-16 ENCOUNTER — Telehealth (HOSPITAL_BASED_OUTPATIENT_CLINIC_OR_DEPARTMENT_OTHER): Payer: Medicare Other | Admitting: Hematology and Oncology

## 2021-06-16 DIAGNOSIS — E781 Pure hyperglyceridemia: Secondary | ICD-10-CM | POA: Diagnosis not present

## 2021-06-16 DIAGNOSIS — D509 Iron deficiency anemia, unspecified: Secondary | ICD-10-CM

## 2021-06-16 DIAGNOSIS — E785 Hyperlipidemia, unspecified: Secondary | ICD-10-CM | POA: Diagnosis not present

## 2021-06-16 DIAGNOSIS — E1159 Type 2 diabetes mellitus with other circulatory complications: Secondary | ICD-10-CM

## 2021-06-16 DIAGNOSIS — I10 Essential (primary) hypertension: Secondary | ICD-10-CM | POA: Diagnosis not present

## 2021-06-16 NOTE — Progress Notes (Signed)
[place] NOTE  Patient Care Team: [name], MD as PCP - General (Family Medicine) Satira Sark, MD as PCP - Cardiology (Cardiology) Allene Pyo, DPM as Consulting Physician (Podiatry) Cloria Spring, MD as Consulting Physician ([place]) Beryle Lathe, [facility] (Pharmacist)  CHIEF COMPLAINTS/PURPOSE OF CONSULTATION:  IDA  ASSESSMENT & PLAN:  No problem-specific Assessment & Plan notes found for this encounter.  No orders of the defined types were placed in this encounter.  This is a very pleasant 60 yr old male with IDA referred to hematology for further recommendations. Patient has chronic fatigue and SOB which he attributes to angina. I have reviewed labs which showed, ferritin of 23. I didn't see recent CBC, last CBC in July with normal hemoglobin His last colonoscopy in July 2022, no significant source of bleed. Patient also denies any major change in bowel habits,  We have discussed about causes of IDA including blood loss, malabsorption and increased consumption. He doesn't appear to have clear evidence of blood loss.  He doesn't have a recent CBC but his last CBC was normal in July He agrees to trial of oral iron and is agreeable for follow up with repeat labs. If he doesn't have any improvement with oral iron, it is reasonable to proceed with IV iron.  HISTORY OF PRESENTING ILLNESS:  Jared Griffin 60 y.o. male is here because of iron deficiency  Jared Griffin complains of fatigue, worsening SOB which he attributes to angina. No change in bowel habits,  has some ongoing diarrhea from E coli infection. He follows up with GI periodically for irritable bowel syndrome, diarrhea predominant. No hematochezia or melena, no dietary restrictions. No hematuria.  No family history of cancer. Last colonoscopy in July 2022. NO colon cancer in the family No history of gastric by pass surgery Besides baseline fatigue,  worsening SOB, baseline diarrhea, no additional complaints. Rest of the pertinent 10 point ROS reviewed and negative.   MEDICAL HISTORY:  Past Medical History:  Diagnosis Date   Anxiety    COPD (chronic obstructive pulmonary disease) ([place])    Coronary atherosclerosis of native coronary artery    a. 12/09/2013: s/p PCI with 3.0 x 28 Promus DES to mLAD   Depression    Diabetic neuropathy ([place])    Elbow injury 07/25/2017   Surgery for torn tendon   Essential hypertension    Fatty liver disease, nonalcoholic    GERD (gastroesophageal reflux disease)    Lateral epicondylitis of right elbow    Mixed hyperlipidemia    Obesity    Osgood-Schlatter's disease of right knee    Skin cyst 10/03/2017   Removed from back   Type 2 diabetes mellitus ([place])     SURGICAL HISTORY: Past Surgical History:  Procedure Laterality Date   BIOPSY  01/21/2019   Procedure: BIOPSY;  Surgeon: [name], MD;  [facility];  Service: Endoscopy;;  gastric bx and gastric polyps   BIOPSY  11/08/2020   Procedure: BIOPSY;  Surgeon: [name], MD;  [facility];  Service: Gastroenterology;;   CARDIAC CATHETERIZATION  2011   CARDIAC CATHETERIZATION N/A 09/20/2015   Procedure: Left Heart Cath and Coronary Angiography;  Surgeon: [name], MD;  Location: [place] [facility];  Service: Cardiovascular;  Laterality: N/A;   CHOLECYSTECTOMY  2003   COLONOSCOPY  06/19/2012   Procedure: COLONOSCOPY;  Surgeon: [name], MD;  [facility];  Service: Endoscopy;  Laterality: N/A;  930   COLONOSCOPY N/A 10/17/2017   Procedure: COLONOSCOPY;  Surgeon: [name], MD;  [facility];  Service: Endoscopy;  Laterality: N/A;  1225   COLONOSCOPY WITH PROPOFOL N/A 11/08/2020   Procedure: COLONOSCOPY WITH PROPOFOL;  Surgeon: [name], MD;  [facility];  Service: Gastroenterology;  Laterality: N/A;  10:45   COLONOSCOPY WITH  PROPOFOL N/A 04/04/2021   Procedure: COLONOSCOPY WITH PROPOFOL;  Surgeon: [name], MD;  [facility];  Service: Gastroenterology;  Laterality: N/A;   CYST EXCISION  07/2019   ESOPHAGOGASTRODUODENOSCOPY (EGD) WITH PROPOFOL N/A 01/21/2019   Procedure: ESOPHAGOGASTRODUODENOSCOPY (EGD) WITH PROPOFOL;  Surgeon: [name], MD;  [facility];  Service: Endoscopy;  Laterality: N/A;  10:15am   ESOPHAGOGASTRODUODENOSCOPY (EGD) WITH PROPOFOL N/A 02/17/2021   Procedure: ESOPHAGOGASTRODUODENOSCOPY (EGD) WITH PROPOFOL;  Surgeon: [name], MD;  [facility];  Service: Gastroenterology;  Laterality: N/A;  10:20   ESOPHAGOGASTRODUODENOSCOPY (EGD) WITH PROPOFOL N/A 04/04/2021   Procedure: ESOPHAGOGASTRODUODENOSCOPY (EGD) WITH PROPOFOL;  Surgeon: [name], MD;  [facility];  Service: Gastroenterology;  Laterality: N/A;  12:30   KNEE ARTHROPLASTY Right 1999   LEFT HEART CATH AND CORONARY ANGIOGRAPHY N/A 02/26/2018   Procedure: LEFT HEART CATH AND CORONARY ANGIOGRAPHY;  Surgeon: [name], MD;  Location: [place] [facility];  Service: Cardiovascular;  Laterality: N/A;   LEFT HEART CATHETERIZATION WITH CORONARY ANGIOGRAM N/A 12/09/2013   Procedure: LEFT HEART CATHETERIZATION WITH CORONARY ANGIOGRAM;  Surgeon: [name], MD;  Location: [facility];  Service: Cardiovascular;  Laterality: N/A;   Liver biopsy     POLYPECTOMY  10/17/2017   Procedure: POLYPECTOMY;  Surgeon: [name], MD;  [facility];  Service: Endoscopy;;  ascending colon, splenic flexure,sigmoid x2   POLYPECTOMY  11/08/2020   Procedure: POLYPECTOMY;  Surgeon: [name], MD;  [facility];  Service: Gastroenterology;;   POLYPECTOMY  04/04/2021   Procedure: POLYPECTOMY;  Surgeon: [name], MD;  [facility];  Service: Gastroenterology;;   TENNIS ELBOW RELEASE/NIRSCHEL  PROCEDURE Right 07/25/2017   Procedure: TENNIS ELBOW RELEASE and debriedment;  Surgeon: [name], MD;  Location: [facility];  Service: Orthopedics;  Laterality: Right;   TONSILLECTOMY  1970's    SOCIAL HISTORY: Social History   Socioeconomic History   Marital status: Married    Spouse name: Not on file   Number of children: Not on file   Years of education: Not on file   Highest education level: Not on file  Occupational History   Not on file  Tobacco Use   Smoking status: Former    Packs/day: 0.50    Years: 10.00    Pack years: 5.00    Types: Cigarettes    Start date: 02/02/1973    Quit date: 09/17/1983    Years since quitting: 37.7   Smokeless tobacco: Former    Types: Chew    Quit date: 09/17/1983  Vaping Use   Vaping Use: Never used  Substance and Sexual Activity   Alcohol use: No    Alcohol/week: 0.0 standard drinks   Drug use: No   Sexual activity: Yes  Other Topics Concern   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    Married with 1 child   Right handed  35 th    Very little caffeine   Social Determinants of Radio broadcast assistant Strain: Not on file  Food Insecurity: Not on file  Transportation Needs: Not on file  Physical Activity: Not on file  Stress: Not on file  Social Connections: Not on file  Intimate Partner Violence: Not on file    FAMILY HISTORY: Family History  Problem Relation Age of Onset   Osteoporosis Mother    Cancer Maternal Aunt    CVA Maternal Grandmother    CVA Maternal Grandfather    Healthy Son     ALLERGIES:  is allergic to doxycycline.  MEDICATIONS:  Current Outpatient Medications  Medication Sig Dispense Refill   acetaminophen (TYLENOL) 500 MG tablet Take 1,000 mg by mouth every 8 (eight) hours as needed for moderate pain.     albuterol (VENTOLIN HFA) 108 (90 Base) MCG/ACT inhaler INHALE 2 PUFFS INTO LUNGS EVERY 6  HOURS AS NEEDED FOR WHEEZING AND SHORTNESS OF BREATH. (Patient taking differently: Inhale 2 puffs into the lungs every 6 (six) hours as needed for shortness of breath or wheezing. INHALE 2 PUFFS INTO LUNGS EVERY 6 HOURS AS NEEDED FOR WHEEZING AND SHORTNESS OF BREATH.) 8.5 g 5   ALPRAZolam (XANAX) 1 MG tablet Take 1 tablet (1 mg total) by mouth 3 (three) times daily as needed for anxiety. 90 tablet 2   amoxicillin-clavulanate (AUGMENTIN) 875-125 MG tablet Take 1 tablet by mouth 2 (two) times daily. 14 tablet 0   aspirin 81 MG EC tablet Take 1 tablet (81 mg total) by mouth daily. 30 tablet 12   atorvastatin (LIPITOR) 40 MG tablet TAKE ONE TABLET (40MG TOTAL) BY MOUTH DAILY 90 tablet 1   B-D UF III MINI PEN NEEDLES 31G X 5 MM MISC USE UP TO 4 TIMES DAILY WITH INSULIN AS DIRECTED. 100 each 3   bisoprolol (ZEBETA) 5 MG tablet Take 1 tablet (5 mg total) by mouth daily. 90 tablet 2   cetirizine (ZYRTEC) 10 MG tablet Take 1 tablet (10 mg total) by mouth daily. 30 tablet 0   Continuous Blood Gluc Sensor (FREESTYLE LIBRE 14 DAY SENSOR) MISC Inject 1 each into the skin every 14 (fourteen) days. Use as directed. 2 each 2   dapagliflozin propanediol (FARXIGA) 5 MG TABS tablet Take 5 mg by mouth daily.     dicyclomine (BENTYL) 10 MG capsule Take 1 capsule (10 mg total) by mouth 3 (three) times daily before meals. 90 capsule 3   Exenatide ER (BYDUREON BCISE) 2 MG/0.85ML AUIJ Inject 2 mg into the skin every Monday.     fluticasone (FLONASE) 50 MCG/ACT nasal spray Place 2 sprays into both nostrils daily. (Patient taking differently: Place 2 sprays into both nostrils daily as needed for allergies.) 16 g 0   fluticasone-salmeterol (ADVAIR HFA) 115-21 MCG/ACT inhaler Inhale 2 puffs into the lungs 2 (two) times daily. 1 Inhaler 5   gabapentin (NEURONTIN) 300 MG capsule Take 2 qam and 2 in the evening. 120 capsule 5   HUMALOG KWIKPEN 200 UNIT/ML KwikPen Inject 30-35 Units into the skin 3 (three) times daily.      hyoscyamine (LEVSIN SL) 0.125 MG SL tablet Place 1 tablet (0.125 mg total) under the tongue every 4 (four) hours as needed. 90 tablet 3   ipratropium (ATROVENT) 0.06 % nasal spray Place 2 sprays into both nostrils in the morning and at bedtime.     Lancets MISC 1 each by Does not apply route 4 (four) times daily. 150 each  5   losartan (COZAAR) 25 MG tablet TAKE (1) TABLET BY MOUTH ONCE DAILY. (Patient taking differently: Take 25 mg by mouth daily.) 90 tablet 3   metFORMIN (GLUCOPHAGE) 500 MG tablet TAKE 2 TABLETS BY MOUTH TWICE DAILY WITH A MEAL. (Patient taking differently: Take 1,000 mg by mouth 2 (two) times daily with a meal.) 120 tablet 0   methocarbamol (ROBAXIN) 500 MG tablet Take 500 mg by mouth every 8 (eight) hours as needed for muscle spasms.     nitroGLYCERIN (NITROSTAT) 0.4 MG SL tablet Place 1 tablet (0.4 mg total) under the tongue every 5 (five) minutes as needed. (Patient not taking: No sig reported) 25 tablet 3   Omega-3 Fatty Acids (FISH OIL) 1200 MG CAPS Take 2 capsules (2,400 mg total) by mouth 2 (two) times daily.     omeprazole (PRILOSEC) 40 MG capsule Take 1 capsule (40 mg total) by mouth daily. 90 capsule 3   ondansetron (ZOFRAN) 4 MG tablet Take 4 mg by mouth every 8 (eight) hours as needed for nausea or vomiting.     ranolazine (RANEXA) 1000 MG SR tablet Take 1 tablet (1,000 mg total) by mouth 2 (two) times daily. 180 tablet 1   sertraline (ZOLOFT) 100 MG tablet Take 1.5 tablets (150 mg total) by mouth daily. 135 tablet 2   sucralfate (CARAFATE) 1 GM/10ML suspension Take 10 mLs (1 g total) by mouth 4 (four) times daily. (Patient not taking: No sig reported) 420 mL 0   TOUJEO MAX SOLOSTAR 300 UNIT/ML Solostar Pen INJECT 130 UNITS INTO THE SKIN AT BEDTIME. (Patient taking differently: Inject 40-50 Units into the skin at bedtime.) 12 mL 0   traZODone (DESYREL) 50 MG tablet Take 1 tablet (50 mg total) by mouth at bedtime as needed. 30 tablet 2   No current facility-administered  medications for this visit.    PHYSICAL EXAMINATION: ECOG PERFORMANCE STATUS: 0 - Asymptomatic  There were no vitals filed for this visit. There were no vitals filed for this visit.  VS and PE not done, tele visit  Patient appears Griffin, no distress  LABORATORY DATA:  I have reviewed the data as listed Lab Results  Component Value Date   WBC 5.4 04/05/2021   HGB 14.0 04/05/2021   HCT 42.2 04/05/2021   MCV 81 04/05/2021   PLT 235 04/05/2021     Chemistry      Component Value Date/Time   NA 137 05/25/2021 0958   K 4.2 05/25/2021 0958   CL 99 05/25/2021 0958   CO2 21 05/25/2021 0958   BUN 15 05/25/2021 0958   CREATININE 0.98 05/25/2021 0958   CREATININE 0.93 08/17/2020 1512      Component Value Date/Time   CALCIUM 9.7 05/25/2021 0958   ALKPHOS 66 05/25/2021 0958   AST 36 05/25/2021 0958   ALT 51 (H) 05/25/2021 0958   BILITOT 0.5 05/25/2021 0958     I connected with  Jared Griffin on 06/16/21 by a video enabled telemedicine application and verified that I am speaking with the correct person using two identifiers.   I discussed the limitations of evaluation and management by telemedicine. The patient expressed understanding and agreed to proceed.  I connected with  Jared Griffin on 06/16/21 by a video enabled telemedicine application and verified that I am speaking with the correct person using two identifiers.   I discussed the limitations of evaluation and management by telemedicine. The patient expressed understanding and agreed to proceed.   RADIOGRAPHIC STUDIES:  I have personally reviewed the radiological images as listed and agreed with the findings in the report. No results found.  All questions were answered. The patient knows to call the clinic with any problems, questions or concerns. I spent 30 minutes in the care of this patient including H and P, review of records, counseling and coordination of care.     Benay Pike, MD 06/16/2021  1:11 PM

## 2021-06-20 ENCOUNTER — Encounter (HOSPITAL_COMMUNITY): Payer: Self-pay

## 2021-06-20 ENCOUNTER — Inpatient Hospital Stay (HOSPITAL_COMMUNITY): Payer: Medicare Other | Attending: Hematology

## 2021-06-20 ENCOUNTER — Other Ambulatory Visit: Payer: Self-pay

## 2021-06-20 DIAGNOSIS — D509 Iron deficiency anemia, unspecified: Secondary | ICD-10-CM | POA: Diagnosis not present

## 2021-06-20 LAB — CBC WITH DIFFERENTIAL/PLATELET
Abs Immature Granulocytes: 0.03 10*3/uL (ref 0.00–0.07)
Basophils Absolute: 0.1 10*3/uL (ref 0.0–0.1)
Basophils Relative: 1 %
Eosinophils Absolute: 0.3 10*3/uL (ref 0.0–0.5)
Eosinophils Relative: 4 %
HCT: 43.4 % (ref 39.0–52.0)
Hemoglobin: 13.7 g/dL (ref 13.0–17.0)
Immature Granulocytes: 1 %
Lymphocytes Relative: 36 %
Lymphs Abs: 2.4 10*3/uL (ref 0.7–4.0)
MCH: 26.3 pg (ref 26.0–34.0)
MCHC: 31.6 g/dL (ref 30.0–36.0)
MCV: 83.5 fL (ref 80.0–100.0)
Monocytes Absolute: 0.6 10*3/uL (ref 0.1–1.0)
Monocytes Relative: 9 %
Neutro Abs: 3.2 10*3/uL (ref 1.7–7.7)
Neutrophils Relative %: 49 %
Platelets: 179 10*3/uL (ref 150–400)
RBC: 5.2 MIL/uL (ref 4.22–5.81)
RDW: 14.1 % (ref 11.5–15.5)
WBC: 6.6 10*3/uL (ref 4.0–10.5)
nRBC: 0 % (ref 0.0–0.2)

## 2021-06-20 LAB — COMPREHENSIVE METABOLIC PANEL
ALT: 49 U/L — ABNORMAL HIGH (ref 0–44)
AST: 45 U/L — ABNORMAL HIGH (ref 15–41)
Albumin: 4.1 g/dL (ref 3.5–5.0)
Alkaline Phosphatase: 46 U/L (ref 38–126)
Anion gap: 10 (ref 5–15)
BUN: 14 mg/dL (ref 6–20)
CO2: 24 mmol/L (ref 22–32)
Calcium: 9.4 mg/dL (ref 8.9–10.3)
Chloride: 101 mmol/L (ref 98–111)
Creatinine, Ser: 0.91 mg/dL (ref 0.61–1.24)
GFR, Estimated: >60 mL/min (ref >60)
Glucose, Bld: 241 mg/dL — ABNORMAL HIGH (ref 70–99)
Potassium: 3.9 mmol/L (ref 3.5–5.1)
Sodium: 135 mmol/L (ref 135–145)
Total Bilirubin: 0.8 mg/dL (ref 0.3–1.2)
Total Protein: 7.3 g/dL (ref 6.5–8.1)

## 2021-06-20 LAB — IRON AND TIBC
Iron: 39 ug/dL — ABNORMAL LOW (ref 45–182)
Saturation Ratios: 7 % — ABNORMAL LOW (ref 17.9–39.5)
TIBC: 549 ug/dL — ABNORMAL HIGH (ref 250–450)
UIBC: 510 ug/dL

## 2021-06-20 LAB — FERRITIN: Ferritin: 12 ng/mL — ABNORMAL LOW (ref 24–336)

## 2021-06-20 LAB — VITAMIN B12: Vitamin B-12: 272 pg/mL (ref 180–914)

## 2021-06-21 ENCOUNTER — Encounter: Payer: Medicare Other | Admitting: Family Medicine

## 2021-06-21 ENCOUNTER — Ambulatory Visit (INDEPENDENT_AMBULATORY_CARE_PROVIDER_SITE_OTHER): Payer: Medicare Other | Admitting: Family Medicine

## 2021-06-21 VITALS — BP 122/75 | HR 78 | Temp 97.5°F | Ht 74.0 in | Wt 266.0 lb

## 2021-06-21 DIAGNOSIS — I10 Essential (primary) hypertension: Secondary | ICD-10-CM | POA: Diagnosis not present

## 2021-06-21 DIAGNOSIS — Z23 Encounter for immunization: Secondary | ICD-10-CM

## 2021-06-21 DIAGNOSIS — Z0000 Encounter for general adult medical examination without abnormal findings: Secondary | ICD-10-CM

## 2021-06-21 DIAGNOSIS — Z0001 Encounter for general adult medical examination with abnormal findings: Secondary | ICD-10-CM

## 2021-06-21 LAB — FOLATE RBC
Folate, Hemolysate: 427 ng/mL
Folate, RBC: 991 ng/mL (ref >498)
Hematocrit: 43.1 % (ref 37.5–51.0)

## 2021-06-21 NOTE — Progress Notes (Signed)
   Subjective:    Patient ID: Jared Griffin, male    DOB: 10/25/1960, 61 y.o.   MRN: [id number]  HPI The patient comes in today for a wellness visit.    A review of their health history was completed.  A review of medications was also completed.  Any needed refills; none  Eating habits: good  Falls/  MVA accidents in past few months: none  Regular exercise:   Specialist pt sees on regular basis: oncology- low iron   Preventative health issues were discussed.   Additional concerns:    Review of Systems     Objective:   Physical Exam  General-in no acute distress Eyes-no discharge Lungs-respiratory rate normal, CTA CV-no murmurs,RRR Extremities skin warm dry no edema Neuro grossly normal Behavior normal, alert  Prostate exam normal     Assessment & Plan:  1. Well adult exam Adult wellness-complete.wellness physical was conducted today. Importance of diet and exercise were discussed in detail.  In addition to this a discussion regarding safety was also covered. We also reviewed over immunizations and gave recommendations regarding current immunization needed for age.  In addition to this additional areas were also touched on including: Preventative health exams needed:  Colonoscopy not due currently Next 10/07/2023  Patient was advised yearly wellness exam   2. Immunization due Today - Flu Vaccine QUAD 23moIM (Fluarix, Fluzone & Alfiuria Quad PF)  3. Essential hypertension, benign Blood pressure decent control continue current measures  Patient with intermittent abdominal pain he will be following up with gastroenterology.  Patient thinking about getting second opinion  See specialist for diabetes  See specialist for iron deficiency

## 2021-06-21 NOTE — Patient Instructions (Signed)

## 2021-06-30 ENCOUNTER — Ambulatory Visit (INDEPENDENT_AMBULATORY_CARE_PROVIDER_SITE_OTHER): Payer: Medicare Other | Admitting: Pharmacist

## 2021-06-30 DIAGNOSIS — E781 Pure hyperglyceridemia: Secondary | ICD-10-CM

## 2021-06-30 DIAGNOSIS — I10 Essential (primary) hypertension: Secondary | ICD-10-CM

## 2021-06-30 DIAGNOSIS — E1159 Type 2 diabetes mellitus with other circulatory complications: Secondary | ICD-10-CM

## 2021-06-30 DIAGNOSIS — I779 Disorder of arteries and arterioles, unspecified: Secondary | ICD-10-CM

## 2021-06-30 DIAGNOSIS — E785 Hyperlipidemia, unspecified: Secondary | ICD-10-CM

## 2021-06-30 NOTE — Chronic Care Management (AMB) (Signed)
Chronic Care Management Pharmacy Note  06/30/2021 Name:  Jared Griffin MRN:  [id number] DOB:  [birth year]  Summary: Patient is overdue for eye exam and foot exam. Will remind patient the importance of annual eye exam at next visit. Recommend that foot exam be completed at next provider visit.  Triglycerides above goal. Patient is currently taking over the counter fish oil 2 capsules twice daily. Will plan to re-check lipid panel in 4-12 weeks to determine if further increase is necessary to decrease triglycerides to goal   Subjective: Jared Griffin is an 60 y.o. year old male who is a primary patient of Luking, Elayne Snare, MD.  The CCM team was consulted for assistance with disease management and care coordination needs.    Engaged with patient's wife Jared Griffin) by telephone since she handles all of his medications for follow up visit in response to provider referral for pharmacy case management and/or care coordination services.   Consent to Services:  The patient was given information about Chronic Care Management services, agreed to services, and gave verbal consent prior to initiation of services.  Please see initial visit note for detailed documentation.   Patient Care Team: [name], MD as PCP - General (Family Medicine) Satira Sark, MD as PCP - Cardiology (Cardiology) Allene Pyo, DPM as Consulting Physician (Podiatry) Cloria Spring, MD as Consulting Physician ([place]) Beryle Lathe, [facility] (Pharmacist)  Objective:  Lab Results  Component Value Date   CREATININE 0.91 06/20/2021   CREATININE 0.98 05/25/2021   CREATININE 1.08 04/05/2021    Lab Results  Component Value Date   HGBA1C 6.6 10/18/2020   Last diabetic Eye exam:  Lab Results  Component Value Date/Time   HMDIABEYEEXA No Retinopathy 08/05/2019 12:00 AM    Last diabetic Foot exam: No results found for: HMDIABFOOTEX      Component Value Date/Time   CHOL 172  05/25/2021 0958   TRIG 455 (H) 05/25/2021 0958   HDL 29 (L) 05/25/2021 0958   CHOLHDL 5.9 (H) 05/25/2021 0958   CHOLHDL 6.4 07/12/2018 0713   VLDL UNABLE TO CALCULATE IF TRIGLYCERIDE OVER 400 mg/dL 07/12/2018 0713   LDLCALC 71 05/25/2021 0958    Hepatic Function Latest Ref Rng & Units 06/20/2021 05/25/2021 05/25/2021  Total Protein 6.5 - 8.1 g/dL 7.3 7.2 7.2  Albumin 3.5 - 5.0 g/dL 4.1 4.5 4.8  AST 15 - 41 U/L 45(H) 36 39  ALT 0 - 44 U/L 49(H) 51(H) 54(H)  Alk Phosphatase 38 - 126 U/L 46 66 62  Total Bilirubin 0.3 - 1.2 mg/dL 0.8 0.5 0.5  Bilirubin, Direct 0.00 - 0.40 mg/dL - 0.18 0.19    Lab Results  Component Value Date/Time   TSH 0.648 08/20/2019 09:37 AM   TSH 0.65 08/20/2019 12:00 AM   TSH 0.525 07/12/2018 07:13 AM   TSH 0.458 04/13/2017 08:48 AM   FREET4 1.35 08/20/2019 09:37 AM    CBC Latest Ref Rng & Units 06/20/2021 06/20/2021 04/05/2021  WBC 4.0 - 10.5 K/uL 6.6 - 5.4  Hemoglobin 13.0 - 17.0 g/dL 13.7 - 14.0  Hematocrit 37.5 - 51.0 % 43.4 43.1 42.2  Platelets 150 - 400 K/uL 179 - 235    Lab Results  Component Value Date/Time   VD25OH 18.6 (L) 08/20/2019 09:37 AM   VD25OH 18.6 08/20/2019 12:00 AM    Clinical ASCVD: No  The 10-year ASCVD risk score (Arnett DK, et al., 2019) is: 20.2%   Values used to calculate the score:  Age: 48 years     Sex: Male     Is Non-Hispanic African American: No     Diabetic: Yes     Tobacco smoker: No     Systolic Blood Pressure: 937 mmHg     Is BP treated: Yes     HDL Cholesterol: 29 mg/dL     Total Cholesterol: 172 mg/dL    Social History   Tobacco Use  Smoking Status Former   Packs/day: 0.50   Years: 10.00   Pack years: 5.00   Types: Cigarettes   Start date: 02/02/1973   Quit date: 09/17/1983   Years since quitting: 37.8  Smokeless Tobacco Former   Types: Chew   Quit date: 09/17/1983   BP Readings from Last 3 Encounters:  06/21/21 122/75  04/04/21 101/67  03/21/21 122/76   Pulse Readings from Last 3 Encounters:   06/21/21 78  04/04/21 95  03/14/21 (!) 106   Wt Readings from Last 3 Encounters:  06/21/21 266 lb (120.7 kg)  03/31/21 263 lb 6.4 oz (119.5 kg)  03/21/21 263 lb 6.4 oz (119.5 kg)    Assessment: Review of patient past medical history, allergies, medications, health status, including review of consultants reports, laboratory and other test data, was performed as part of comprehensive evaluation and provision of chronic care management services.   SDOH:  (Social Determinants of Health) assessments and interventions performed:    CCM Care Plan  Allergies  Allergen Reactions   Doxycycline     Severe esophagitis    Medications Reviewed Today     Reviewed by Beryle Lathe, [facility] (Pharmacist) on 06/30/21 at 1349  Med List Status: <None>   Medication Order Taking? Sig Documenting Provider Last Dose Status Informant  acetaminophen (TYLENOL) 500 MG tablet [id number] Yes Take 1,000 mg by mouth every 8 (eight) hours as needed for moderate pain. [provider] Taking Active Self  albuterol (VENTOLIN HFA) 108 (90 Base) MCG/ACT inhaler [id number] Yes INHALE 2 PUFFS INTO LUNGS EVERY 6 HOURS AS NEEDED FOR WHEEZING AND SHORTNESS OF BREATH.  Patient taking differently: Inhale 2 puffs into the lungs every 6 (six) hours as needed for shortness of breath or wheezing. INHALE 2 PUFFS INTO LUNGS EVERY 6 HOURS AS NEEDED FOR WHEEZING AND SHORTNESS OF BREATH.   Mikey Kirschner, MD Taking Active            Med Note Waldo Laine, Gwenyth Allegra   Fri May 26, 2021  3:35 PM)    ALPRAZolam Duanne Moron) 1 MG tablet [id number] Yes Take 1 tablet (1 mg total) by mouth 3 (three) times daily as needed for anxiety. Cloria Spring, MD Taking Active   aspirin 81 MG EC tablet [id number] Yes Take 1 tablet (81 mg total) by mouth daily. [name], MD Taking Active Self  atorvastatin (LIPITOR) 40 MG tablet [id number] Yes TAKE ONE TABLET (40MG TOTAL) BY MOUTH DAILY Satira Sark, MD Taking Active   B-D UF III  MINI PEN NEEDLES 31G X 5 MM MISC [id number]  USE UP TO 4 TIMES DAILY WITH INSULIN AS DIRECTED. Cassandria Anger, MD  Active Self  bisoprolol (ZEBETA) 5 MG tablet [id number] Yes Take 1 tablet (5 mg total) by mouth daily. Satira Sark, MD Taking Active Self  cetirizine (ZYRTEC) 10 MG tablet [id number] Yes Take 1 tablet (10 mg total) by mouth daily. Stacey Drain [place], PA-C Taking Active Self  Continuous Blood Gluc Sensor (FREESTYLE LIBRE 14 DAY SENSOR) [place] [id number] Yes Inject 1 each into the  skin every 14 (fourteen) days. Use as directed. Cassandria Anger, MD Taking Active Self  dapagliflozin propanediol (FARXIGA) 5 MG TABS tablet [id number] Yes Take 5 mg by mouth daily. [provider] Taking Active Self           Med Note Wilmon Pali, MELISSA R   Wed Sep 14, 2020  9:57 AM)    dicyclomine (BENTYL) 10 MG capsule [id number] Yes Take 1 capsule (10 mg total) by mouth 3 (three) times daily before meals. Gabriel Rung, NP Taking Active   Exenatide ER (BYDUREON BCISE) 2 MG/0.85ML AUIJ [id number] Yes Inject 2 mg into the skin every Monday. [provider] Taking Active Self  fluticasone (FLONASE) 50 MCG/ACT nasal spray [id number] Yes Place 2 sprays into both nostrils daily.  Patient taking differently: Place 2 sprays into both nostrils daily as needed for allergies.   Stacey Drain [place], PA-C Taking Active            Med Note Jilda Roche A   Wed Nov 02, 2020  1:33 PM)    fluticasone-salmeterol (ADVAIR [facility]) 115-21 MCG/ACT inhaler [id number] Yes Inhale 2 puffs into the lungs 2 (two) times daily. Mikey Kirschner, MD Taking Active Self  gabapentin (NEURONTIN) 300 MG capsule [id number] Yes Take 2 qam and 2 in the evening. [name], MD Taking Active   HUMALOG KWIKPEN 200 UNIT/ML KwikPen [id number] Yes Inject 30-35 Units into the skin 3 (three) times daily. [provider] Taking Active Self  hyoscyamine (LEVSIN SL) 0.125 MG SL tablet [id number] Yes Place 1 tablet (0.125 mg  total) under the tongue every 4 (four) hours as needed. Gabriel Rung, NP Taking Active   ipratropium (ATROVENT) 0.06 % nasal spray [id number] Yes Place 2 sprays into both nostrils in the morning and at bedtime. [provider] Taking Active Self  Lancets [place] [id number]  1 each by Does not apply route 4 (four) times daily. Cassandria Anger, MD  Active Self  losartan (COZAAR) 25 MG tablet [id number] Yes TAKE (1) TABLET BY MOUTH ONCE DAILY.  Patient taking differently: Take 25 mg by mouth daily.   Satira Sark, MD Taking Active   metFORMIN (GLUCOPHAGE) 500 MG tablet [id number] Yes TAKE 2 TABLETS BY MOUTH TWICE DAILY WITH A MEAL.  Patient taking differently: Take 1,000 mg by mouth 2 (two) times daily with a meal.   Nida, Marella Chimes, MD Taking Active   methocarbamol (ROBAXIN) 500 MG tablet [id number] Yes Take 500 mg by mouth every 8 (eight) hours as needed for muscle spasms. [provider] Taking Active   nitroGLYCERIN (NITROSTAT) 0.4 MG SL tablet [id number] No Place 1 tablet (0.4 mg total) under the tongue every 5 (five) minutes as needed.  Patient not taking: Reported on 06/30/2021   Herminio Commons, MD Not Taking Active   Omega-3 Fatty Acids (FISH OIL) 1200 MG CAPS [id number] Yes Take 2 capsules (2,400 mg total) by mouth 2 (two) times daily. [name], MD Taking Active   omeprazole (PRILOSEC) 40 MG capsule [id number] Yes Take 1 capsule (40 mg total) by mouth daily. Montez Morita, Quillian Quince, MD Taking Active   ondansetron [place] Day) 4 MG tablet [id number] No Take 4 mg by mouth every 8 (eight) hours as needed for nausea or vomiting.  Patient not taking: No sig reported   [provider] Not Taking Active   ranolazine (RANEXA) 1000 MG SR tablet [id number] Yes Take 1 tablet (1,000 mg total) by mouth 2 (two) times daily. Satira Sark,  MD Taking Active Self  sertraline (ZOLOFT) 100 MG tablet [id number] Yes Take 1.5 tablets (150 mg total) by mouth  daily. Cloria Spring, MD Taking Active   sucralfate (CARAFATE) 1 GM/10ML suspension [id number] Yes Take 10 mLs (1 g total) by mouth 4 (four) times daily. Montez Morita, Quillian Quince, MD Taking Active   TOUJEO MAX SOLOSTAR 300 UNIT/ML Solostar Pen [id number] Yes INJECT 130 UNITS INTO THE SKIN AT BEDTIME.  Patient taking differently: Inject 40-50 Units into the skin at bedtime.   Cassandria Anger, MD Taking Active            Med Note Waldo Laine, Gwenyth Allegra   Fri May 26, 2021  3:35 PM)    traZODone (DESYREL) 50 MG tablet [id number] Yes Take 1 tablet (50 mg total) by mouth at bedtime as needed. Cloria Spring, MD Taking Active             Patient Active Problem List   Diagnosis Date Noted   Orthostatic hypotension 12/07/2020   IBS (irritable bowel syndrome) 11/17/2020   Chronic diarrhea 08/17/2020   NASH (nonalcoholic steatohepatitis) 08/17/2020   Elevated LFTs 08/17/2020   Tremor of both hands 05/06/2020   Bilateral occipital neuralgia 05/06/2020   Abdominal pain, chronic, epigastric 01/13/2019   Anxiety 01/04/2019   Radicular pain of left lower extremity 12/22/2018   Moderate persistent asthma 10/25/2018   Dizziness 07/12/2018   Nonintractable headache    Abnormal nuclear stress test    Aftercare following surgery 07/25/17 08/13/2017   Hx of colonic polyps 08/07/2017   Lateral epicondylitis of right elbow    Cervical nerve root impingement 12/09/2015   Generalized anxiety disorder 09/27/2015   Chest pain 09/20/2015   Pain in the chest    Depression 01/24/2015   Esophageal reflux 01/24/2015   Preoperative cardiovascular examination 06/28/2014   DM type 2 causing vascular disease ([employer])    Mixed hyperlipidemia    Obesity    Intermediate coronary syndrome ([place]) 12/09/2013   Lumbago 05/21/2012   Essential hypertension, benign 12/28/2009   CAD S/P LAD DES March 2015 12/28/2009   Hyperlipidemia LDL goal <70 11/25/2009   Accelerating angina ([place]) 11/25/2009   DM 11/24/2009    ABDOMINAL PAIN, HX OF 11/24/2009    Immunization History  Administered Date(s) Administered   Influenza,inj,Quad PF,6+ Mos 07/11/2016, 08/02/2017, 08/05/2018, 08/10/2019, 08/08/2020, 06/21/2021   Influenza-Unspecified 06/18/2013, 06/17/2014, 07/06/2015   Td 01/28/2014    Conditions to be addressed/monitored: CAD, HTN, HLD, and DMII  Care Plan : Medication Management  Updates made by Beryle Lathe, [place] since 06/30/2021 12:00 AM     Problem: HTN, CAD/HLD, T2DM   Priority: High  Onset Date: 05/26/2021     Long-Range Goal: Disease Progression Prevention   Start Date: 05/26/2021  Expected End Date: 08/24/2021  Recent Progress: On track  Priority: High  Note:   Current Barriers:  Unable to achieve control of hyperlipidemia  Pharmacist Clinical Goal(s):  Over the next 90 days, patient will Achieve control of hyperlipidemia as evidenced by improved LDL and improved triglycerides through collaboration with PharmD and provider.   Interventions: 1:1 collaboration with [name], MD regarding development and update of comprehensive plan of care as evidenced by provider attestation and co-signature Inter-disciplinary care team collaboration (see longitudinal plan of care) Comprehensive medication review performed; medication list updated in electronic medical record  Type 2 Diabetes (Followed by Dr. Garnet Koyanagi @ [facility]): Controlled; Most recent A1c at goal of <7% per ADA guidelines Current medications:  metformin 1,000 mg by mouth twice daily with meals, exenatide ER (Bydureon) 2 mg subcutaneously weekly on Monday, dapagliflozin (Farxiga) 5 mg by mouth every morning, and insulin glargine (Toujeo) 40-50 units subcutaneously once daily and insulin lispro (Humalog) 30-35 units subcutaneously 5-15 minutes before each meal Intolerances: none Taking medications as directed: yes Side effects thought to be attributed to current medication regimen:  no Hypoglycemia prevention: none but will recommend Current meal patterns: not discussed today Current exercise: not discussed today On a statin: yes On aspirin 81 mg daily: yes Last microalbumin: 15.7 (2020); on an ACEi/ARB: yes Last eye exam: overdue Last foot exam: overdue Pneumonia vaccine: unknown Influenza vaccine:  needs this fall Current glucose readings:  not discussed today but will discuss at future visits The following education was provided: Introduction to diabetes basic facts Instructed to monitor blood sugars continuously with continuous glucose monitor  Encouraged regular aerobic exercise with a goal of 30 minutes five times per week (150 minutes per week) Continue current management per endocrinology as above. Continue to monitor for signs or symptoms of hypoglycemia Consider prescribing glucagon in case of emergency hypoglycemia   Hypertension: Blood pressure under good control. Blood pressure is at goal of <130/80 mmHg per 2017 AHA/ACC guidelines. Current medications: losartan 25 mg by mouth once daily and bisoprolol 5 mg by mouth once daily Intolerances: none Taking medications as directed: yes Side effects thought to be attributed to current medication regimen: no Current home blood pressure: not discussed today but will discuss at future visits Continue losartan 25 mg by mouth once daily and bisoprolol 5 mg by mouth once daily Encourage dietary sodium restriction/DASH diet Recommend regular aerobic exercise Recommend home blood pressure monitoring to discuss at next visit  Coronary artery disease s/p DES 2015/Hyperlipidemia/Angina (Followed by Dr. [name]): Uncontrolled.  LDL 71 which is near goal of <70 due to very high risk given 10-year risk >20% per 2020 AACE/ACE guidelines  Triglycerides well above goal of <150 per 2020 AACE/ACE guidelines Does not smoke currently. Only smoked a little bit when he was a teenager. Wife does currently smoke Current  medications: atorvastatin 40 mg by mouth once daily, aspirin 81 mg by mouth daily, losartan 25 mg by mouth daily, bisoprolol 5 mg by mouth daily, ranolazine 1,000 mg by mouth twice daily, fish oil 2 capsules twice daily, and nitroglycerin as needed for chest pain Intolerances: none Taking medications as directed: yes Side effects thought to be attributed to current medication regimen: no Continue statin, aspirin, beta blocker, angiotensin receptor blocker, ranolazine, fish oil, and nitroglycerin as above Recommend regular aerobic exercise Would prefer for patient to have prescription strength fish oil such as Vascepa (icosapent ethyl) or Lovaza (omega-3 ethyl esters); however, these agent are $100 per month on the patient's insurance.  In order to achieve similar dose of EPA to Vascepa (2g twice daily), patient would need to take 4-8 capsules twice daily of an over the counter fish oil depending on the brand. Patient is currently taking over the counter fish oil 2 capsules twice daily. Will plan to re-check lipid panel in 4-12 weeks to determine if further increase is necessary to decrease triglycerides to goal   Patient Goals/Self-Care Activities Over the next 90 days, patient will:  Take medications as prescribed Check blood sugar continuously with continuous glucose monitor, document, and provide at future appointments Check blood pressure at least once daily, document, and provide at future appointments Engage in dietary modifications by fewer sweetened foods & beverages  Follow  Up Plan: Telephone follow up appointment with care management team member scheduled for: 08/25/21      Medication Assistance: None required.  Patient affirms current coverage meets needs.  Patient's preferred pharmacy is:  [facility], [address] [place] [address] Phone: [phone] Fax: [phone]  Follow Up:  Patient agrees to Care Plan and  Follow-up.  Plan: Telephone follow up appointment with care management team member scheduled for:  08/25/21  Kennon Holter, PharmD Clinical Pharmacist [place] [phone]

## 2021-06-30 NOTE — Patient Instructions (Signed)
Jared Griffin,  Jared Griffin was great to talk to you today!  Please call me with any questions or concerns.   Visit Information  PATIENT GOALS:  Goals Addressed             This Visit's Progress    Medication Management       Patient Goals/Self-Care Activities Over the next 90 days, patient will:  Take medications as prescribed Check blood sugar continuously with continuous glucose monitor, document, and provide at future appointments Check blood pressure at least once daily, document, and provide at future appointments Engage in dietary modifications by fewer sweetened foods & beverages           Patient verbalizes understanding of instructions provided today and agrees to view in [place].   Telephone follow up appointment with care management team member scheduled for:08/25/21  Kennon Holter, PharmD Clinical Pharmacist [place] [phone]

## 2021-07-10 ENCOUNTER — Telehealth: Payer: Self-pay | Admitting: Neurology

## 2021-07-10 NOTE — Telephone Encounter (Signed)
Pt on schedule for later this week.  We sent him to [facility] and neurosurgery (see prior notes).  DaT scan normal.  Can you call patient and see how we can help him?

## 2021-07-11 ENCOUNTER — Telehealth: Payer: Self-pay | Admitting: *Deleted

## 2021-07-11 ENCOUNTER — Other Ambulatory Visit: Payer: Self-pay | Admitting: *Deleted

## 2021-07-11 DIAGNOSIS — E781 Pure hyperglyceridemia: Secondary | ICD-10-CM

## 2021-07-11 NOTE — Telephone Encounter (Signed)
Called patient back and asked about his referrals and what his upcoming appt was regarding. Patient said he did go to [facility] as well as seeing Dr. Vertell Limber for his neck. He stated his tremors in his left arm have increased. I did ask him about pain management and he said that's not what he needs but is concerned about the tremors and all tests have shown it is not part of his injury at work but is concerned because those tremors in left arm are getting pretty bad

## 2021-07-11 NOTE — Telephone Encounter (Signed)
Jared Drown, MD  Please have patient do lipid profile in approximately 4 to 8 weeks fasting thank you hyperlipidemia     Blood work ordered in [employer]. Left message to return call to notify patient.

## 2021-07-11 NOTE — Telephone Encounter (Signed)
Patient has been informed per chart notes to have fasting labs drawn.

## 2021-07-13 NOTE — Progress Notes (Signed)
Assessment/Plan:    1.  Tremor  -Told the patient again that there was no evidence that this tremor is related to his prior work injury.  -DaTscan previously done and was negative.  -tremor has some functional qualities but also has some qualities consistent with essential tremor.  Would like to try primidone, but this interacts with his Ranexa.  He is already on a beta-blocker.  He really has very few other options.  I went ahead and sent the primidone to his pharmacy, but asked him not to take it until I talk with his cardiologist about it.  I will plan on getting back in touch with the patient next week.  Subjective:   Jared Griffin was seen today in follow up for tremor.  My previous records were reviewed prior to todays visit.  DaTscan was done in September, 2021.  This was normal.  Images personally reviewed.  Patient returns today.  Patient still thinks tremor is a consequence of a work injury he had in February, 2018 when working for [employer].  He pulled something off the shelf and developed elbow pain from it.  Orthopedic notes at that time indicated that patient developed a resting tremor.  Pt states today that tremor is in both sides now.  Tremor is most noticeable when eating.  The R will do it at rest.  It will come and go.  He is R handed.  The R is worse than the L now.  Trouble writing now.  Doesn't change with time of day.  Physical fatigue may make it worse.      Meds that may affect tremor:  xanax 1 mg tid; gabapentin; albuterol; atrovent   ALLERGIES:   Allergies  Allergen Reactions   Doxycycline     Severe esophagitis    CURRENT MEDICATIONS:  Outpatient Encounter Medications as of 07/14/2021  Medication Sig   acetaminophen (TYLENOL) 500 MG tablet Take 1,000 mg by mouth every 8 (eight) hours as needed for moderate pain.   albuterol (VENTOLIN HFA) 108 (90 Base) MCG/ACT inhaler INHALE 2 PUFFS INTO LUNGS EVERY 6 HOURS AS NEEDED FOR WHEEZING AND SHORTNESS OF  BREATH. (Patient taking differently: Inhale 2 puffs into the lungs every 6 (six) hours as needed for shortness of breath or wheezing. INHALE 2 PUFFS INTO LUNGS EVERY 6 HOURS AS NEEDED FOR WHEEZING AND SHORTNESS OF BREATH.)   ALPRAZolam (XANAX) 1 MG tablet Take 1 tablet (1 mg total) by mouth 3 (three) times daily as needed for anxiety.   aspirin 81 MG EC tablet Take 1 tablet (81 mg total) by mouth daily.   atorvastatin (LIPITOR) 40 MG tablet TAKE ONE TABLET (40MG TOTAL) BY MOUTH DAILY   B-D UF III MINI PEN NEEDLES 31G X 5 MM MISC USE UP TO 4 TIMES DAILY WITH INSULIN AS DIRECTED.   bisoprolol (ZEBETA) 5 MG tablet Take 1 tablet (5 mg total) by mouth daily.   cetirizine (ZYRTEC) 10 MG tablet Take 1 tablet (10 mg total) by mouth daily.   Continuous Blood Gluc Sensor (FREESTYLE LIBRE 14 DAY SENSOR) MISC Inject 1 each into the skin every 14 (fourteen) days. Use as directed.   dapagliflozin propanediol (FARXIGA) 5 MG TABS tablet Take 5 mg by mouth daily.   dicyclomine (BENTYL) 10 MG capsule Take 1 capsule (10 mg total) by mouth 3 (three) times daily before meals.   Exenatide ER (BYDUREON BCISE) 2 MG/0.85ML AUIJ Inject 2 mg into the skin every Monday.   fluticasone (FLONASE)  50 MCG/ACT nasal spray Place 2 sprays into both nostrils daily. (Patient taking differently: Place 2 sprays into both nostrils daily as needed for allergies.)   fluticasone-salmeterol (ADVAIR HFA) 115-21 MCG/ACT inhaler Inhale 2 puffs into the lungs 2 (two) times daily.   gabapentin (NEURONTIN) 300 MG capsule Take 2 qam and 2 in the evening.   HUMALOG KWIKPEN 200 UNIT/ML KwikPen Inject 30-35 Units into the skin 3 (three) times daily.   hyoscyamine (LEVSIN SL) 0.125 MG SL tablet Place 1 tablet (0.125 mg total) under the tongue every 4 (four) hours as needed.   ipratropium (ATROVENT) 0.06 % nasal spray Place 2 sprays into both nostrils in the morning and at bedtime.   Lancets MISC 1 each by Does not apply route 4 (four) times daily.    losartan (COZAAR) 25 MG tablet TAKE (1) TABLET BY MOUTH ONCE DAILY. (Patient taking differently: Take 25 mg by mouth daily.)   metFORMIN (GLUCOPHAGE) 500 MG tablet TAKE 2 TABLETS BY MOUTH TWICE DAILY WITH A MEAL. (Patient taking differently: Take 1,000 mg by mouth 2 (two) times daily with a meal.)   methocarbamol (ROBAXIN) 500 MG tablet Take 500 mg by mouth every 8 (eight) hours as needed for muscle spasms.   nitroGLYCERIN (NITROSTAT) 0.4 MG SL tablet Place 1 tablet (0.4 mg total) under the tongue every 5 (five) minutes as needed.   Omega-3 Fatty Acids (FISH OIL) 1200 MG CAPS Take 2 capsules (2,400 mg total) by mouth 2 (two) times daily.   omeprazole (PRILOSEC) 40 MG capsule Take 1 capsule (40 mg total) by mouth daily.   ondansetron (ZOFRAN) 4 MG tablet Take 4 mg by mouth every 8 (eight) hours as needed for nausea or vomiting.   ranolazine (RANEXA) 1000 MG SR tablet Take 1 tablet (1,000 mg total) by mouth 2 (two) times daily.   sertraline (ZOLOFT) 100 MG tablet Take 1.5 tablets (150 mg total) by mouth daily.   sucralfate (CARAFATE) 1 GM/10ML suspension Take 10 mLs (1 g total) by mouth 4 (four) times daily.   TOUJEO MAX SOLOSTAR 300 UNIT/ML Solostar Pen INJECT 130 UNITS INTO THE SKIN AT BEDTIME. (Patient taking differently: Inject 40-50 Units into the skin at bedtime.)   traZODone (DESYREL) 50 MG tablet Take 1 tablet (50 mg total) by mouth at bedtime as needed.   No facility-administered encounter medications on file as of 07/14/2021.     Objective:    PHYSICAL EXAMINATION:    VITALS:   Vitals:   07/14/21 1507  BP: 136/82  Pulse: 84  Resp: 18  Weight: 265 lb (120.2 kg)  Height: 6' 2"  (1.88 m)    GEN:  The patient appears stated age and is in NAD. HEENT:  Normocephalic, atraumatic.  The mucous membranes are moist. The superficial temporal arteries are without ropiness or tenderness. CV:  RRR Lungs:  CTAB Neck/HEME:  There are no carotid bruits bilaterally.  Neurological  examination:  Orientation: The patient is alert and oriented x3. Cranial nerves: There is good facial symmetry. The speech is fluent and clear. Soft palate rises symmetrically and there is no tongue deviation. Hearing is intact to conversational tone. Sensation: Sensation is intact to light touch throughout Motor: Strength is at least antigravity x4.  Movement examination: Tone: There is some trouble relaxing, but when he does tone appears to be normal. Abnormal movements: there is Tremor in the RUE >LUE with some inconsistent evidence of entrainment.  He has some trouble with Archimedes spirals. Coordination:  There is no decremation  today with rapid alternating movements.  I have reviewed and interpreted the following labs independently   Chemistry      Component Value Date/Time   NA 135 06/20/2021 0839   NA 137 05/25/2021 0958   K 3.9 06/20/2021 0839   CL 101 06/20/2021 0839   CO2 24 06/20/2021 0839   BUN 14 06/20/2021 0839   BUN 15 05/25/2021 0958   CREATININE 0.91 06/20/2021 0839   CREATININE 0.93 08/17/2020 1512      Component Value Date/Time   CALCIUM 9.4 06/20/2021 0839   ALKPHOS 46 06/20/2021 0839   AST 45 (H) 06/20/2021 0839   ALT 49 (H) 06/20/2021 0839   BILITOT 0.8 06/20/2021 0839   BILITOT 0.5 05/25/2021 0958      Lab Results  Component Value Date   WBC 6.6 06/20/2021   HGB 13.7 06/20/2021   HCT 43.1 06/20/2021   HCT 43.4 06/20/2021   MCV 83.5 06/20/2021   PLT 179 06/20/2021   Lab Results  Component Value Date   TSH 0.648 08/20/2019     Chemistry      Component Value Date/Time   NA 135 06/20/2021 0839   NA 137 05/25/2021 0958   K 3.9 06/20/2021 0839   CL 101 06/20/2021 0839   CO2 24 06/20/2021 0839   BUN 14 06/20/2021 0839   BUN 15 05/25/2021 0958   CREATININE 0.91 06/20/2021 0839   CREATININE 0.93 08/17/2020 1512      Component Value Date/Time   CALCIUM 9.4 06/20/2021 0839   ALKPHOS 46 06/20/2021 0839   AST 45 (H) 06/20/2021 0839   ALT  49 (H) 06/20/2021 0839   BILITOT 0.8 06/20/2021 0839   BILITOT 0.5 05/25/2021 0958         Total time spent on today's visit was 20 minutes, including both face-to-face time and nonface-to-face time.  Time included that spent on review of records (prior notes available to me/labs/imaging if pertinent), discussing treatment and goals, answering patient's questions and coordinating care.  Cc:  [name], MD

## 2021-07-14 ENCOUNTER — Telehealth: Payer: Self-pay | Admitting: Neurology

## 2021-07-14 ENCOUNTER — Encounter: Payer: Self-pay | Admitting: Neurology

## 2021-07-14 ENCOUNTER — Other Ambulatory Visit: Payer: Self-pay

## 2021-07-14 ENCOUNTER — Ambulatory Visit: Payer: Medicare Other | Admitting: Neurology

## 2021-07-14 VITALS — BP 136/82 | HR 84 | Resp 18 | Ht 74.0 in | Wt 265.0 lb

## 2021-07-14 DIAGNOSIS — R251 Tremor, unspecified: Secondary | ICD-10-CM | POA: Diagnosis not present

## 2021-07-14 MED ORDER — PRIMIDONE 50 MG PO TABS: 50.0000 mg | ORAL_TABLET | Freq: Every day | ORAL | 1 refills | Status: DC

## 2021-07-14 NOTE — Telephone Encounter (Signed)
Please call the patient and tell him:  Do NOT take the primidone.   I talked to the cardiologist and we are going to STOP the bisoprolol and instead take propranolol 20 mg twice per day.  Please take the bisoprolol off the list and call the propranolol to the pharmacy  Please call the pharmacy and cancel the primidone due to interaction with the Ranexa!

## 2021-07-14 NOTE — Patient Instructions (Signed)
I sent a RX for primidone but I don't you to start this until we hear its okay from Dr. [name].  If you get the go ahead, we will start primidone 50 mg - 1/2 tablet at bedtime for 1 week and then increase to 1 tablet at bedtime thereafter.  Wait until you hear from their office or mine to start it though.

## 2021-07-17 ENCOUNTER — Other Ambulatory Visit: Payer: Self-pay

## 2021-07-17 ENCOUNTER — Other Ambulatory Visit: Payer: Self-pay | Admitting: Family Medicine

## 2021-07-17 ENCOUNTER — Telehealth (INDEPENDENT_AMBULATORY_CARE_PROVIDER_SITE_OTHER): Payer: Self-pay

## 2021-07-17 DIAGNOSIS — I10 Essential (primary) hypertension: Secondary | ICD-10-CM | POA: Diagnosis not present

## 2021-07-17 DIAGNOSIS — E781 Pure hyperglyceridemia: Secondary | ICD-10-CM | POA: Diagnosis not present

## 2021-07-17 DIAGNOSIS — E785 Hyperlipidemia, unspecified: Secondary | ICD-10-CM

## 2021-07-17 DIAGNOSIS — R251 Tremor, unspecified: Secondary | ICD-10-CM

## 2021-07-17 DIAGNOSIS — E1159 Type 2 diabetes mellitus with other circulatory complications: Secondary | ICD-10-CM

## 2021-07-17 DIAGNOSIS — M542 Cervicalgia: Secondary | ICD-10-CM

## 2021-07-17 MED ORDER — PROPRANOLOL HCL 20 MG PO TABS: 20.0000 mg | ORAL_TABLET | Freq: Two times a day (BID) | ORAL | 0 refills | Status: DC

## 2021-07-17 NOTE — Telephone Encounter (Signed)
Called patient back and we discussed the two medications to stop and the propanolol 20 mg that Dr. Carles Collet wants patient on now. Patient had no questions at this time and let him know if he had any questions to call back

## 2021-07-17 NOTE — Telephone Encounter (Signed)
Called patient and left a message to please call me back about his medication change. I took the primidone and Bisoprolol off the medication list. Added Propanolol to medication list and will call pharmacy to update new prescription

## 2021-07-17 NOTE — Telephone Encounter (Signed)
Patient called today stating he is back to feeling like he had at last hospitalization. He states he is having mid epigastric pain and burning sensation, and has diarrhea up to ten times per day. He is taking Zofran prn, Prilosec 40 mg, dicyclomine TID and nothing is helping per patient.

## 2021-07-17 NOTE — Telephone Encounter (Signed)
Pt called in and left a message returning Chelsea's call

## 2021-07-18 ENCOUNTER — Encounter (INDEPENDENT_AMBULATORY_CARE_PROVIDER_SITE_OTHER): Payer: Self-pay

## 2021-07-18 ENCOUNTER — Other Ambulatory Visit (INDEPENDENT_AMBULATORY_CARE_PROVIDER_SITE_OTHER): Payer: Self-pay | Admitting: Gastroenterology

## 2021-07-18 DIAGNOSIS — K580 Irritable bowel syndrome with diarrhea: Secondary | ICD-10-CM

## 2021-07-18 MED ORDER — RIFAXIMIN 550 MG PO TABS: 550.0000 mg | ORAL_TABLET | Freq: Three times a day (TID) | ORAL | 0 refills | Status: AC

## 2021-07-18 NOTE — Progress Notes (Signed)
Crystal, he will come and pick up the Gi pathogen order at our office tomorrow in the morning.

## 2021-07-18 NOTE — Telephone Encounter (Signed)
Spoke with the patient today who reports that he is having a bowel movement after eating any type of food.  He is still having some epigastric abdominal pain.  States that his diarrhea subsided for a while but it came back 2 weeks ago.  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  I will also send a prescription for Xifaxan 550 mg 3 times a day for post infectious irritable bowel syndrome diarrhea predominant.

## 2021-07-18 NOTE — Progress Notes (Signed)
Printed and placed at the front desk for the patient to pick up .

## 2021-07-27 NOTE — Progress Notes (Signed)
Cardiology Office Note  Date: 07/28/2021   ID: Dion, Sibal [birth year], MRN [id number]  PCP:  [name], MD  Cardiologist:  Rozann Lesches, MD Electrophysiologist:  None   Chief Complaint  Patient presents with   Cardiac follow-up    History of Present Illness: Jared Griffin is a 60 y.o. male last seen in May.  He is here for a routine visit.  Since last encounter he does not describe any progressive angina symptoms or nitroglycerin use.  He reports compliance with his medications.  I reviewed his present regimen as listed below.  Beta-blocker was switched to Inderal by PCP for control of tremors, otherwise cardiac regimen is stable.  We are sending in a refill for a fresh bottle of nitroglycerin.  Lipid panel in September revealed LDL 71.  Past Medical History:  Diagnosis Date   Anxiety    COPD (chronic obstructive pulmonary disease) ([place])    Coronary atherosclerosis of native coronary artery    a. 12/09/2013: s/p PCI with 3.0 x 28 Promus DES to mLAD   Depression    Diabetic neuropathy ([place])    Elbow injury 07/25/2017   Surgery for torn tendon   Essential hypertension    Fatty liver disease, nonalcoholic    GERD (gastroesophageal reflux disease)    Lateral epicondylitis of right elbow    Mixed hyperlipidemia    Obesity    Osgood-Schlatter's disease of right knee    Skin cyst 10/03/2017   Removed from back   Type 2 diabetes mellitus ([place])     Past Surgical History:  Procedure Laterality Date   BIOPSY  01/21/2019   Procedure: BIOPSY;  Surgeon: [name], MD;  [facility];  Service: Endoscopy;;  gastric bx and gastric polyps   BIOPSY  11/08/2020   Procedure: BIOPSY;  Surgeon: [name], MD;  [facility];  Service: Gastroenterology;;   CARDIAC CATHETERIZATION  2011   CARDIAC CATHETERIZATION N/A 09/20/2015   Procedure: Left Heart Cath and Coronary Angiography;  Surgeon: [name], MD;   Location: [place] [facility];  Service: Cardiovascular;  Laterality: N/A;   CHOLECYSTECTOMY  2003   COLONOSCOPY  06/19/2012   Procedure: COLONOSCOPY;  Surgeon: [name], MD;  [facility];  Service: Endoscopy;  Laterality: N/A;  930   COLONOSCOPY N/A 10/17/2017   Procedure: COLONOSCOPY;  Surgeon: [name], MD;  [facility];  Service: Endoscopy;  Laterality: N/A;  1225   COLONOSCOPY WITH PROPOFOL N/A 11/08/2020   Procedure: COLONOSCOPY WITH PROPOFOL;  Surgeon: [name], MD;  [facility];  Service: Gastroenterology;  Laterality: N/A;  10:45   COLONOSCOPY WITH PROPOFOL N/A 04/04/2021   Procedure: COLONOSCOPY WITH PROPOFOL;  Surgeon: [name], MD;  [facility];  Service: Gastroenterology;  Laterality: N/A;   CYST EXCISION  07/2019   ESOPHAGOGASTRODUODENOSCOPY (EGD) WITH PROPOFOL N/A 01/21/2019   Procedure: ESOPHAGOGASTRODUODENOSCOPY (EGD) WITH PROPOFOL;  Surgeon: [name], MD;  [facility];  Service: Endoscopy;  Laterality: N/A;  10:15am   ESOPHAGOGASTRODUODENOSCOPY (EGD) WITH PROPOFOL N/A 02/17/2021   Procedure: ESOPHAGOGASTRODUODENOSCOPY (EGD) WITH PROPOFOL;  Surgeon: [name], MD;  [facility];  Service: Gastroenterology;  Laterality: N/A;  10:20   ESOPHAGOGASTRODUODENOSCOPY (EGD) WITH PROPOFOL N/A 04/04/2021   Procedure: ESOPHAGOGASTRODUODENOSCOPY (EGD) WITH PROPOFOL;  Surgeon: [name], MD;  [facility];  Service: Gastroenterology;  Laterality: N/A;  12:30   KNEE ARTHROPLASTY Right 1999   LEFT HEART CATH AND CORONARY ANGIOGRAPHY N/A 02/26/2018   Procedure: LEFT HEART CATH AND CORONARY ANGIOGRAPHY;  Surgeon: [name], MD;  Location: [place] [facility];  Service: Cardiovascular;  Laterality: N/A;   LEFT HEART CATHETERIZATION WITH CORONARY ANGIOGRAM N/A 12/09/2013   Procedure: LEFT HEART CATHETERIZATION WITH CORONARY  ANGIOGRAM;  Surgeon: [name], MD;  [facility];  Service: Cardiovascular;  Laterality: N/A;   Liver biopsy     POLYPECTOMY  10/17/2017   Procedure: POLYPECTOMY;  Surgeon: [name], MD;  [facility];  Service: Endoscopy;;  ascending colon, splenic flexure,sigmoid x2   POLYPECTOMY  11/08/2020   Procedure: POLYPECTOMY;  Surgeon: [name], MD;  [facility];  Service: Gastroenterology;;   POLYPECTOMY  04/04/2021   Procedure: POLYPECTOMY;  Surgeon: [name], MD;  [facility];  Service: Gastroenterology;;   TENNIS ELBOW RELEASE/NIRSCHEL PROCEDURE Right 07/25/2017   Procedure: TENNIS ELBOW RELEASE and debriedment;  Surgeon: [name], MD;  Location: [facility];  Service: Orthopedics;  Laterality: Right;   TONSILLECTOMY  1970's    Current Outpatient Medications  Medication Sig Dispense Refill   acetaminophen (TYLENOL) 500 MG tablet Take 1,000 mg by mouth every 8 (eight) hours as needed for moderate pain.     albuterol (VENTOLIN HFA) 108 (90 Base) MCG/ACT inhaler INHALE 2 PUFFS INTO LUNGS EVERY 6 HOURS AS NEEDED FOR WHEEZING AND SHORTNESS OF BREATH. (Patient taking differently: Inhale 2 puffs into the lungs every 6 (six) hours as needed for shortness of breath or wheezing. INHALE 2 PUFFS INTO LUNGS EVERY 6 HOURS AS NEEDED FOR WHEEZING AND SHORTNESS OF BREATH.) 8.5 g 5   ALPRAZolam (XANAX) 1 MG tablet Take 1 tablet (1 mg total) by mouth 3 (three) times daily as needed for anxiety. 90 tablet 2   aspirin 81 MG EC tablet Take 1 tablet (81 mg total) by mouth daily. 30 tablet 12   atorvastatin (LIPITOR) 40 MG tablet TAKE ONE TABLET (40MG TOTAL) BY MOUTH DAILY 90 tablet 1   B-D UF III MINI PEN NEEDLES 31G X 5 MM MISC USE UP TO 4 TIMES DAILY WITH INSULIN AS DIRECTED. 100 each 3   cetirizine (ZYRTEC) 10 MG tablet Take 1 tablet (10 mg total) by mouth daily. 30 tablet 0   Continuous Blood Gluc Sensor (FREESTYLE  LIBRE 14 DAY SENSOR) MISC Inject 1 each into the skin every 14 (fourteen) days. Use as directed. 2 each 2   dapagliflozin propanediol (FARXIGA) 5 MG TABS tablet Take 5 mg by mouth daily.     dicyclomine (BENTYL) 10 MG capsule Take 1 capsule (10 mg total) by mouth 3 (three) times daily before meals. 90 capsule 3   Exenatide ER (BYDUREON BCISE) 2 MG/0.85ML AUIJ Inject 2 mg into the skin every Monday.     fluticasone (FLONASE) 50 MCG/ACT nasal spray Place 2 sprays into both nostrils daily. (Patient taking differently: Place 2 sprays into both nostrils daily as needed for allergies.) 16 g 0   fluticasone-salmeterol (ADVAIR HFA) 115-21 MCG/ACT inhaler Inhale 2 puffs into the lungs 2 (two) times daily. 1 Inhaler 5   gabapentin (NEURONTIN) 300 MG capsule Take 2 qam and 2 in the evening. 120 capsule 5   HUMALOG KWIKPEN 200 UNIT/ML KwikPen Inject 30-35 Units into the skin 3 (three) times daily.     hyoscyamine (LEVSIN SL) 0.125 MG SL tablet Place 1 tablet (0.125 mg  total) under the tongue every 4 (four) hours as needed. 90 tablet 3   ipratropium (ATROVENT) 0.06 % nasal spray Place 2 sprays into both nostrils in the morning and at bedtime.     Lancets MISC 1 each by Does not apply route 4 (four) times daily. 150 each 5   losartan (COZAAR) 25 MG tablet TAKE (1) TABLET BY MOUTH ONCE DAILY. (Patient taking differently: Take 25 mg by mouth daily.) 90 tablet 3   metFORMIN (GLUCOPHAGE) 500 MG tablet TAKE 2 TABLETS BY MOUTH TWICE DAILY WITH A MEAL. (Patient taking differently: Take 1,000 mg by mouth 2 (two) times daily with a meal.) 120 tablet 0   methocarbamol (ROBAXIN) 500 MG tablet Take 500 mg by mouth every 8 (eight) hours as needed for muscle spasms.     Omega-3 Fatty Acids (FISH OIL) 1200 MG CAPS Take 2 capsules (2,400 mg total) by mouth 2 (two) times daily.     omeprazole (PRILOSEC) 40 MG capsule Take 1 capsule (40 mg total) by mouth daily. 90 capsule 3   ondansetron (ZOFRAN) 4 MG tablet Take 4 mg by mouth  every 8 (eight) hours as needed for nausea or vomiting.     propranolol (INDERAL) 20 MG tablet Take 1 tablet (20 mg total) by mouth 2 (two) times daily. 180 tablet 0   ranolazine (RANEXA) 1000 MG SR tablet Take 1 tablet (1,000 mg total) by mouth 2 (two) times daily. 180 tablet 1   rifaximin (XIFAXAN) 550 MG TABS tablet Take 1 tablet (550 mg total) by mouth 3 (three) times daily for 14 days. 42 tablet 0   sertraline (ZOLOFT) 100 MG tablet Take 1.5 tablets (150 mg total) by mouth daily. 135 tablet 2   sucralfate (CARAFATE) 1 GM/10ML suspension Take 10 mLs (1 g total) by mouth 4 (four) times daily. 420 mL 0   TOUJEO MAX SOLOSTAR 300 UNIT/ML Solostar Pen INJECT 130 UNITS INTO THE SKIN AT BEDTIME. (Patient taking differently: Inject 40-50 Units into the skin at bedtime.) 12 mL 0   traZODone (DESYREL) 50 MG tablet Take 1 tablet (50 mg total) by mouth at bedtime as needed. 30 tablet 2   nitroGLYCERIN (NITROSTAT) 0.4 MG SL tablet Place 1 tablet (0.4 mg total) under the tongue every 5 (five) minutes as needed. 25 tablet 3   No current facility-administered medications for this visit.   Allergies:  Doxycycline   ROS:  No syncope.  Physical Exam: VS:  BP 128/70   Pulse 84   Ht 6' 2"  (1.88 m)   Wt 268 lb (121.6 kg)   SpO2 96%   BMI 34.41 kg/m , BMI Body mass index is 34.41 kg/m.  Wt Readings from Last 3 Encounters:  07/28/21 268 lb (121.6 kg)  07/14/21 265 lb (120.2 kg)  06/21/21 266 lb (120.7 kg)    General: Patient appears comfortable at rest. HEENT: Conjunctiva and lids normal, wearing a mask. Neck: Supple, no elevated JVP or carotid bruits, no thyromegaly. Lungs: Clear to auscultation, nonlabored breathing at rest. Cardiac: Regular rate and rhythm, no S3 or significant systolic murmur, no pericardial rub. Extremities: No pitting edema.  ECG:  An ECG dated 03/14/2021 was personally reviewed today and demonstrated:  Sinus tachycardia with nonspecific T wave changes.  Recent  Labwork: 03/14/2021: Magnesium 1.4 06/20/2021: ALT 49; AST 45; BUN 14; Creatinine, Ser 0.91; Hemoglobin 13.7; Platelets 179; Potassium 3.9; Sodium 135     Component Value Date/Time   CHOL 172 05/25/2021 0958   TRIG 455 (H)  05/25/2021 0958   HDL 29 (L) 05/25/2021 0958   CHOLHDL 5.9 (H) 05/25/2021 0958   CHOLHDL 6.4 07/12/2018 0713   VLDL UNABLE TO CALCULATE IF TRIGLYCERIDE OVER 400 mg/dL 07/12/2018 0713   LDLCALC 71 05/25/2021 0958    Other Studies Reviewed Today:  Echocardiogram 08/09/2020:  1. Left ventricular ejection fraction, by estimation, is 60 to 65%. The  left ventricle has normal function. The left ventricle has no regional  wall motion abnormalities. There is mild left ventricular hypertrophy.  Left ventricular diastolic parameters  were normal.   2. Right ventricular systolic function is normal. The right ventricular  size is normal.   3. Left atrial size was mildly dilated.   4. The mitral valve is normal in structure. Trivial mitral valve  regurgitation. No evidence of mitral stenosis.   5. The aortic valve is normal in structure. Aortic valve regurgitation is  not visualized. No aortic stenosis is present.   6. The inferior vena cava is normal in size with greater than 50%  respiratory variability, suggesting right atrial pressure of 3 mmHg.   Carotid Dopplers 03/10/2021: IMPRESSION: Minimal amount of bilateral atherosclerotic plaque, slightly progressed compared to the 2019 examination, though not resulting in a hemodynamically significant stenosis within either internal carotid artery.  Assessment and Plan:  1.  CAD status post DES to the LAD in 2015, patent at angiography in 2019.  LVEF 60 to 65%.  He does not report any progressive angina on medical therapy.  Continue aspirin, losartan, Farxiga, Ranexa, Inderal, Lipitor, and as a nitroglycerin.  2.  Mixed hyperlipidemia, on Lipitor and omega-3 supplements.  Recent LDL 71.  Medication Adjustments/Labs and  Tests Ordered: Current medicines are reviewed at length with the patient today.  Concerns regarding medicines are outlined above.   Tests Ordered: No orders of the defined types were placed in this encounter.   Medication Changes: Meds ordered this encounter  Medications   nitroGLYCERIN (NITROSTAT) 0.4 MG SL tablet    Sig: Place 1 tablet (0.4 mg total) under the tongue every 5 (five) minutes as needed.    Dispense:  25 tablet    Refill:  3     Disposition:  Follow up  6 months.  Signed, Satira Sark, MD, [facility] 07/28/2021 8:32 AM    [place] [facility] [place], [place], [place] [address] Phone: [phone]; Fax: [phone]

## 2021-07-28 ENCOUNTER — Other Ambulatory Visit: Payer: Self-pay

## 2021-07-28 ENCOUNTER — Encounter: Payer: Self-pay | Admitting: Cardiology

## 2021-07-28 ENCOUNTER — Ambulatory Visit: Payer: Medicare Other | Admitting: Cardiology

## 2021-07-28 VITALS — BP 128/70 | HR 84 | Ht 74.0 in | Wt 268.0 lb

## 2021-07-28 DIAGNOSIS — E782 Mixed hyperlipidemia: Secondary | ICD-10-CM | POA: Diagnosis not present

## 2021-07-28 DIAGNOSIS — I25119 Atherosclerotic heart disease of native coronary artery with unspecified angina pectoris: Secondary | ICD-10-CM | POA: Diagnosis not present

## 2021-07-28 LAB — GASTROINTESTINAL PATHOGEN PANEL PCR
C. difficile Tox A/B, PCR: NOT DETECTED
Campylobacter, PCR: NOT DETECTED
Cryptosporidium, PCR: NOT DETECTED
E coli (ETEC) LT/ST PCR: NOT DETECTED
E coli (STEC) stx1/stx2, PCR: NOT DETECTED
E coli 0157, PCR: NOT DETECTED
Giardia lamblia, PCR: NOT DETECTED
Norovirus, PCR: NOT DETECTED
Rotavirus A, PCR: NOT DETECTED
Salmonella, PCR: NOT DETECTED
Shigella, PCR: NOT DETECTED

## 2021-07-28 MED ORDER — NITROGLYCERIN 0.4 MG SL SUBL: 0.4000 mg | SUBLINGUAL_TABLET | SUBLINGUAL | 3 refills | Status: DC | PRN

## 2021-07-28 NOTE — Patient Instructions (Signed)
Medication Instructions:  Your physician recommends that you continue on your current medications as directed. Please refer to the Current Medication list given to you today.   I refilled your nitroglycerine  Labwork: None today  Testing/Procedures: None today  Follow-Up: 6 months Dr.McDowell  Any Other Special Instructions Will Be Listed Below (If Applicable).  If you need a refill on your cardiac medications before your next appointment, please call your pharmacy.

## 2021-08-08 ENCOUNTER — Ambulatory Visit (INDEPENDENT_AMBULATORY_CARE_PROVIDER_SITE_OTHER): Payer: Medicare Other

## 2021-08-08 ENCOUNTER — Other Ambulatory Visit: Payer: Self-pay

## 2021-08-08 VITALS — Ht 74.0 in | Wt 268.0 lb

## 2021-08-08 DIAGNOSIS — Z0000 Encounter for general adult medical examination without abnormal findings: Secondary | ICD-10-CM

## 2021-08-08 NOTE — Progress Notes (Signed)
Subjective:   Jared Griffin is a 60 y.o. male who presents for an Initial Medicare Annual Wellness Visit. Virtual Visit via Telephone Note  I connected with  Jared Griffin on 08/08/21 at  1:00 PM EST by telephone and verified that I am speaking with the correct person using two identifiers.  Location: Patient: Home Provider: [facility] Persons participating in the virtual visit: patient/Nurse Health Advisor   I discussed the limitations, risks, security and privacy concerns of performing an evaluation and management service by telephone and the availability of in person appointments. The patient expressed understanding and agreed to proceed.  Interactive audio and video telecommunications were attempted between this nurse and patient, however failed, due to patient having technical difficulties OR patient did not have access to video capability.  We continued and completed visit with audio only.  Some vital signs may be absent or patient reported.   Jared Driver, LPN  Review of Systems     Cardiac Risk Factors include: advanced age (>39mn, >>68women);obesity (BMI >30kg/m2);sedentary lifestyle;male gender;diabetes mellitus;dyslipidemia;hypertension     Objective:    Today's Vitals   08/08/21 1332  Weight: 268 lb (121.6 kg)  Height: 6' 2"  (1.88 m)   Body mass index is 34.41 kg/m.  Advanced Directives 08/08/2021 07/14/2021 04/04/2021 03/31/2021 03/14/2021 02/17/2021 02/16/2021  Does Patient Have a Medical Advance Directive? Yes Yes No No No No No  Type of AParamedicof ALos ChavesLiving will - - - - - -  Does patient want to make changes to medical advance directive? - - - - - - -  Copy of [place] Chart? No - copy requested - - - - - -  Would patient like information on creating a medical advance directive? No - Patient declined - No - Patient declined No - Patient declined - No - Patient declined No - Patient declined     Current Medications (verified) Outpatient Encounter Medications as of 08/08/2021  Medication Sig   acetaminophen (TYLENOL) 500 MG tablet Take 1,000 mg by mouth every 8 (eight) hours as needed for moderate pain.   albuterol (VENTOLIN HFA) 108 (90 Base) MCG/ACT inhaler INHALE 2 PUFFS INTO LUNGS EVERY 6 HOURS AS NEEDED FOR WHEEZING AND SHORTNESS OF BREATH. (Patient taking differently: Inhale 2 puffs into the lungs every 6 (six) hours as needed for shortness of breath or wheezing. INHALE 2 PUFFS INTO LUNGS EVERY 6 HOURS AS NEEDED FOR WHEEZING AND SHORTNESS OF BREATH.)   ALPRAZolam (XANAX) 1 MG tablet Take 1 tablet (1 mg total) by mouth 3 (three) times daily as needed for anxiety.   amoxicillin-clavulanate (AUGMENTIN) 500-125 MG tablet Take 1 tablet by mouth 3 (three) times daily.   aspirin 81 MG EC tablet Take 1 tablet (81 mg total) by mouth daily.   atorvastatin (LIPITOR) 40 MG tablet TAKE ONE TABLET (40MG TOTAL) BY MOUTH DAILY   B-D UF III MINI PEN NEEDLES 31G X 5 MM MISC USE UP TO 4 TIMES DAILY WITH INSULIN AS DIRECTED.   cetirizine (ZYRTEC) 10 MG tablet Take 1 tablet (10 mg total) by mouth daily.   Continuous Blood Gluc Sensor (FREESTYLE LIBRE 14 DAY SENSOR) MISC Inject 1 each into the skin every 14 (fourteen) days. Use as directed.   dapagliflozin propanediol (FARXIGA) 5 MG TABS tablet Take 5 mg by mouth daily.   dicyclomine (BENTYL) 10 MG capsule Take 1 capsule (10 mg total) by mouth 3 (three) times daily before meals.  Exenatide ER (BYDUREON BCISE) 2 MG/0.85ML AUIJ Inject 2 mg into the skin every Monday.   fluticasone (FLONASE) 50 MCG/ACT nasal spray Place 2 sprays into both nostrils daily. (Patient taking differently: Place 2 sprays into both nostrils daily as needed for allergies.)   fluticasone-salmeterol (ADVAIR HFA) 115-21 MCG/ACT inhaler Inhale 2 puffs into the lungs 2 (two) times daily.   gabapentin (NEURONTIN) 300 MG capsule Take 2 qam and 2 in the evening.   HUMALOG KWIKPEN 200  UNIT/ML KwikPen Inject 30-35 Units into the skin 3 (three) times daily.   hyoscyamine (LEVSIN SL) 0.125 MG SL tablet Place 1 tablet (0.125 mg total) under the tongue every 4 (four) hours as needed.   ipratropium (ATROVENT) 0.06 % nasal spray Place 2 sprays into both nostrils in the morning and at bedtime.   Lancets MISC 1 each by Does not apply route 4 (four) times daily.   losartan (COZAAR) 25 MG tablet TAKE (1) TABLET BY MOUTH ONCE DAILY. (Patient taking differently: Take 25 mg by mouth daily.)   metFORMIN (GLUCOPHAGE) 500 MG tablet TAKE 2 TABLETS BY MOUTH TWICE DAILY WITH A MEAL. (Patient taking differently: Take 1,000 mg by mouth 2 (two) times daily with a meal.)   methocarbamol (ROBAXIN) 500 MG tablet Take 500 mg by mouth every 8 (eight) hours as needed for muscle spasms.   nitroGLYCERIN (NITROSTAT) 0.4 MG SL tablet Place 1 tablet (0.4 mg total) under the tongue every 5 (five) minutes as needed.   Omega-3 Fatty Acids (FISH OIL) 1200 MG CAPS Take 2 capsules (2,400 mg total) by mouth 2 (two) times daily.   omeprazole (PRILOSEC) 40 MG capsule Take 1 capsule (40 mg total) by mouth daily.   ondansetron (ZOFRAN) 4 MG tablet Take 4 mg by mouth every 8 (eight) hours as needed for nausea or vomiting.   propranolol (INDERAL) 20 MG tablet Take 1 tablet (20 mg total) by mouth 2 (two) times daily.   ranolazine (RANEXA) 1000 MG SR tablet Take 1 tablet (1,000 mg total) by mouth 2 (two) times daily.   sertraline (ZOLOFT) 100 MG tablet Take 1.5 tablets (150 mg total) by mouth daily.   TOUJEO MAX SOLOSTAR 300 UNIT/ML Solostar Pen INJECT 130 UNITS INTO THE SKIN AT BEDTIME. (Patient taking differently: Inject 40-50 Units into the skin at bedtime.)   traZODone (DESYREL) 50 MG tablet Take 1 tablet (50 mg total) by mouth at bedtime as needed.   sucralfate (CARAFATE) 1 GM/10ML suspension Take 10 mLs (1 g total) by mouth 4 (four) times daily. (Patient not taking: Reported on 08/08/2021)   No facility-administered  encounter medications on file as of 08/08/2021.    Allergies (verified) Doxycycline   History: Past Medical History:  Diagnosis Date   Anxiety    COPD (chronic obstructive pulmonary disease) (HCC)    Coronary atherosclerosis of native coronary artery    a. 12/09/2013: s/p PCI with 3.0 x 28 Promus DES to mLAD   Depression    Diabetic neuropathy ([place])    Elbow injury 07/25/2017   Surgery for torn tendon   Essential hypertension    Fatty liver disease, nonalcoholic    GERD (gastroesophageal reflux disease)    Lateral epicondylitis of right elbow    Mixed hyperlipidemia    Obesity    Osgood-Schlatter's disease of right knee    Skin cyst 10/03/2017   Removed from back   Type 2 diabetes mellitus [facility])    Past Surgical History:  Procedure Laterality Date   BIOPSY  01/21/2019  Procedure: BIOPSY;  Surgeon: [name], MD;  [facility];  Service: Endoscopy;;  gastric bx and gastric polyps   BIOPSY  11/08/2020   Procedure: BIOPSY;  Surgeon: [name], MD;  [facility];  Service: Gastroenterology;;   CARDIAC CATHETERIZATION  2011   CARDIAC CATHETERIZATION N/A 09/20/2015   Procedure: Left Heart Cath and Coronary Angiography;  Surgeon: [name], MD;  Location: [place] [facility];  Service: Cardiovascular;  Laterality: N/A;   CHOLECYSTECTOMY  2003   COLONOSCOPY  06/19/2012   Procedure: COLONOSCOPY;  Surgeon: [name], MD;  [facility];  Service: Endoscopy;  Laterality: N/A;  930   COLONOSCOPY N/A 10/17/2017   Procedure: COLONOSCOPY;  Surgeon: [name], MD;  [facility];  Service: Endoscopy;  Laterality: N/A;  1225   COLONOSCOPY WITH PROPOFOL N/A 11/08/2020   Procedure: COLONOSCOPY WITH PROPOFOL;  Surgeon: [name], MD;  [facility];  Service: Gastroenterology;  Laterality: N/A;  10:45   COLONOSCOPY WITH PROPOFOL N/A 04/04/2021   Procedure: COLONOSCOPY WITH PROPOFOL;   Surgeon: [name], MD;  [facility];  Service: Gastroenterology;  Laterality: N/A;   CYST EXCISION  07/2019   ESOPHAGOGASTRODUODENOSCOPY (EGD) WITH PROPOFOL N/A 01/21/2019   Procedure: ESOPHAGOGASTRODUODENOSCOPY (EGD) WITH PROPOFOL;  Surgeon: [name], MD;  [facility];  Service: Endoscopy;  Laterality: N/A;  10:15am   ESOPHAGOGASTRODUODENOSCOPY (EGD) WITH PROPOFOL N/A 02/17/2021   Procedure: ESOPHAGOGASTRODUODENOSCOPY (EGD) WITH PROPOFOL;  Surgeon: [name], MD;  [facility];  Service: Gastroenterology;  Laterality: N/A;  10:20   ESOPHAGOGASTRODUODENOSCOPY (EGD) WITH PROPOFOL N/A 04/04/2021   Procedure: ESOPHAGOGASTRODUODENOSCOPY (EGD) WITH PROPOFOL;  Surgeon: [name], MD;  [facility];  Service: Gastroenterology;  Laterality: N/A;  12:30   KNEE ARTHROPLASTY Right 1999   LEFT HEART CATH AND CORONARY ANGIOGRAPHY N/A 02/26/2018   Procedure: LEFT HEART CATH AND CORONARY ANGIOGRAPHY;  Surgeon: [name], MD;  Location: [place] [facility];  Service: Cardiovascular;  Laterality: N/A;   LEFT HEART CATHETERIZATION WITH CORONARY ANGIOGRAM N/A 12/09/2013   Procedure: LEFT HEART CATHETERIZATION WITH CORONARY ANGIOGRAM;  Surgeon: [name], MD;  Location: [facility] CATH LAB;  Service: Cardiovascular;  Laterality: N/A;   Liver biopsy     POLYPECTOMY  10/17/2017   Procedure: POLYPECTOMY;  Surgeon: [name], MD;  [facility];  Service: Endoscopy;;  ascending colon, splenic flexure,sigmoid x2   POLYPECTOMY  11/08/2020   Procedure: POLYPECTOMY;  Surgeon: [name], MD;  [facility];  Service: Gastroenterology;;   POLYPECTOMY  04/04/2021   Procedure: POLYPECTOMY;  Surgeon: [name], MD;  [facility];  Service: Gastroenterology;;   TENNIS ELBOW RELEASE/NIRSCHEL PROCEDURE Right 07/25/2017   Procedure: TENNIS ELBOW RELEASE and  debriedment;  Surgeon: [name], MD;  Location: [facility];  Service: Orthopedics;  Laterality: Right;   TONSILLECTOMY  1970's   Family History  Problem Relation Age of Onset   Osteoporosis Mother    Cancer Maternal Aunt    CVA Maternal Grandmother    CVA Maternal Grandfather    Healthy Son    Social History   Socioeconomic History   Marital status: Married    Spouse name: [name]   Number of children: 1   Years of education: Not on file   Highest education level: Not on file  Occupational History   Not on  file  Tobacco Use   Smoking status: Former    Packs/day: 0.50    Years: 10.00    Pack years: 5.00    Types: Cigarettes    Start date: 02/02/1973    Quit date: 09/17/1983    Years since quitting: 37.9   Smokeless tobacco: Former    Types: Chew    Quit date: 09/17/1983  Vaping Use   Vaping Use: Never used  Substance and Sexual Activity   Alcohol use: No    Alcohol/week: 0.0 standard drinks   Drug use: No   Sexual activity: Yes  Other Topics Concern   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    Married with 1 child-son   Right handed    12 th    Very little caffeine   Social Determinants of Radio broadcast assistant Strain: Low Risk    Difficulty of Paying Living Expenses: Not hard at all  Food Insecurity: No Food Insecurity   Worried About Charity fundraiser in the Last Year: Never true   Arboriculturist in the Last Year: Never true  Transportation Needs: No Transportation Needs   Lack of Transportation (Medical): No   Lack of Transportation (Non-Medical): No  Physical Activity: Insufficiently Active   Days of Exercise per Week: 3 days   Minutes of Exercise per Session: 30 min  Stress: No Stress Concern Present   Feeling of Stress : Not at all  Social Connections: Socially Integrated   Frequency of Communication with Friends and Family: More than three times a  week   Frequency of Social Gatherings with Friends and Family: More than three times a week   Attends Religious Services: More than 4 times per year   Active Member of [employer] or Organizations: Yes   Attends Music therapist: More than 4 times per year   Marital Status: Married    Tobacco Counseling Counseling given: Not Answered   Clinical Intake:  Pre-visit preparation completed: Yes  Pain : No/denies pain     BMI - recorded: 34.41 Nutritional Status: BMI > 30  Obese Nutritional Risks: None Diabetes: Yes  How often do you need to have someone help you when you read instructions, pamphlets, or other written materials from your doctor or pharmacy?: 1 - Never  Diabetic?Nutrition Risk Assessment:  Has the patient had any N/V/D within the last 2 months?  No  Does the patient have any non-healing wounds?  No  Has the patient had any unintentional weight loss or weight gain?  No   Diabetes:  Is the patient diabetic?  Yes  If diabetic, was a CBG obtained today?  No  Did the patient bring in their glucometer from home?  No  How often do you monitor your CBG's? Freestyle Libre.   Financial Strains and Diabetes Management:  Are you having any financial strains with the device, your supplies or your medication? No .  Does the patient want to be seen by Chronic Care Management for management of their diabetes?  No  Would the patient like to be referred to a Nutritionist or for Diabetic Management?  No   Diabetic Exams:  Diabetic Eye Exam: Completed 06/01/2021. Pt has been advised about the importance in completing this exam.  Diabetic Foot Exam: Completed 2022 at Endocrinologist per pt. Pt has been advised about the importance in completing this exam.  Interpreter Needed?: No  Information entered by :: MJ Jarvis Sawa, LPN   Activities of Daily Living In your present state of health, do you have any difficulty performing the following activities: 08/08/2021  03/31/2021  Hearing? N N  Vision? N N  Difficulty concentrating or making decisions? N N  Walking or climbing stairs? N N  Comment - -  Dressing or bathing? N N  Doing errands, shopping? N N  Preparing Food and eating ? N -  Using the Toilet? N -  In the past six months, have you accidently leaked urine? N -  Do you have problems with loss of bowel control? N -  Managing your Medications? N -  Managing your Finances? N -  Housekeeping or managing your Housekeeping? N -  Some recent data might be hidden    Patient Care Team: [name], MD as PCP - General (Family Medicine) Satira Sark, MD as PCP - Cardiology (Cardiology) Allene Pyo, DPM as Consulting Physician (Podiatry) Cloria Spring, MD as Consulting Physician ([place]) Beryle Lathe, [facility] (Pharmacist)  Indicate any recent Medical Services you may have received from other than [facility] providers in the past year (date may be approximate).     Assessment:   This is a routine wellness examination for Jared Griffin.  Hearing/Vision screen Hearing Screening - Comments:: No hearing issues.  Vision Screening - Comments:: Glasses-distance. Dr. Jorja Loa. 06/01/2021.  Dietary issues and exercise activities discussed: Current Exercise Habits: Home exercise routine, Type of exercise: walking, Time (Minutes): 20, Frequency (Times/Week): 3, Weekly Exercise (Minutes/Week): 60, Exercise limited by: cardiac condition(s)   Goals Addressed             This Visit's Progress    DIET - REDUCE CALORIE INTAKE       Pt states he would like to lose weight.        Depression Screen PHQ 2/9 Scores 08/08/2021 06/21/2021 12/12/2020 12/07/2020 08/08/2020 12/09/2019 08/06/2018  PHQ - 2 Score 0 0 0 0 4 5 0  PHQ- 9 Score 0 0 - - 10 11 -  Some encounter information is confidential and restricted. Go to Review Flowsheets activity to see all data.    Fall Risk Fall Risk  08/08/2021 07/14/2021 06/21/2021 12/12/2020 05/10/2020   Falls in the past year? 1 1 0 0 1  Number falls in past yr: 0 0 0 0 1  Injury with Fall? 0 0 0 0 0  Risk for fall due to : History of fall(s) - No Fall Risks - -  Follow up Falls prevention discussed - Falls evaluation completed Falls evaluation completed -    FALL RISK PREVENTION PERTAINING TO THE HOME:  Any stairs in or around the home? Yes  If so, are there any without handrails? No  Home free of loose throw rugs in walkways, pet beds, electrical cords, etc? Yes  Adequate lighting in your home to reduce risk of falls? Yes   ASSISTIVE DEVICES UTILIZED TO PREVENT FALLS:  Life Griffin? Yes  Use of a cane, walker or w/c? No  Grab bars in the bathroom? No  Shower chair or bench in shower? No  Elevated toilet seat or a handicapped toilet? No   TIMED UP AND GO:  Was the test performed? No . Phone visit.   Cognitive Function:     6CIT Screen 08/08/2021  What Year? 0 points  What month? 0 points  What time? 0 points  Count back from 20 0 points  Months  in reverse 0 points  Repeat phrase 0 points  Total Score 0    Immunizations Immunization History  Administered Date(s) Administered   Influenza,inj,Quad PF,6+ Mos 07/11/2016, 08/02/2017, 08/05/2018, 08/10/2019, 08/08/2020, 06/21/2021   Influenza-Unspecified 06/18/2013, 06/17/2014, 07/06/2015   Td 01/28/2014    TDAP status: Up to date  Flu Vaccine status: Up to date  Pneumococcal vaccine status: Due, Education has been provided regarding the importance of this vaccine. Advised may receive this vaccine at local pharmacy or Health Dept. Aware to provide a copy of the vaccination record if obtained from local pharmacy or Health Dept. Verbalized acceptance and understanding.  Covid-19 vaccine status: Declined, Education has been provided regarding the importance of this vaccine but patient still declined. Advised may receive this vaccine at local pharmacy or Health Dept.or vaccine clinic. Aware to provide a copy of the  vaccination record if obtained from local pharmacy or Health Dept. Verbalized acceptance and understanding.  Qualifies for Shingles Vaccine? Yes   Zostavax completed No   Shingrix Completed?: No.    Education has been provided regarding the importance of this vaccine. Patient has been advised to call insurance company to determine out of pocket expense if they have not yet received this vaccine. Advised may also receive vaccine at local pharmacy or Health Dept. Verbalized acceptance and understanding.  Screening Tests Health Maintenance  Topic Date Due   Pneumococcal Vaccine 42-29 Years old (1 - PCV) Never done   Zoster Vaccines- Shingrix (1 of 2) Never done   FOOT EXAM  08/02/2018   OPHTHALMOLOGY EXAM  08/04/2020   HEMOGLOBIN A1C  04/17/2021   TETANUS/TDAP  01/29/2024   COLONOSCOPY (Pts 45-39yr Insurance coverage will need to be confirmed)  04/04/2024   INFLUENZA VACCINE  Completed   Hepatitis C Screening  Completed   HIV Screening  Completed   HPV VACCINES  Aged Out   COVID-19 Vaccine  Discontinued    Health Maintenance  Health Maintenance Due  Topic Date Due   Pneumococcal Vaccine 142694Years old (1 - PCV) Never done   Zoster Vaccines- Shingrix (1 of 2) Never done   FOOT EXAM  08/02/2018   OPHTHALMOLOGY EXAM  08/04/2020   HEMOGLOBIN A1C  04/17/2021    Colorectal cancer screening: Type of screening: Colonoscopy. Completed 04/04/2021. Repeat every 3 years  Lung Cancer Screening: (Low Dose CT Chest recommended if Age 59-80years, 30 pack-year currently smoking OR have quit w/in 15years.) does not qualify.  Quit smoking over 15 years ago.  Additional Screening:  Hepatitis C Screening: does qualify; Completed 05/25/2021  Vision Screening: Recommended annual ophthalmology exams for early detection of glaucoma and other disorders of the eye. Is the patient up to date with their annual eye exam?  Yes  Who is the provider or what is the name of the office in which the patient  attends annual eye exams? Dr. CJorja LoaIf pt is not established with a provider, would they like to be referred to a provider to establish care? No .   Dental Screening: Recommended annual dental exams for proper oral hygiene  Community Resource Referral / Chronic Care Management: CRR required this visit?  No   CCM required this visit?  No      Plan:     I have personally reviewed and noted the following in the patient's chart:   Medical and social history Use of alcohol, tobacco or illicit drugs  Current medications and supplements including opioid prescriptions. Patient is currently taking opioid prescriptions. Information provided  to patient regarding non-opioid alternatives. Patient advised to discuss non-opioid treatment plan with their provider. Functional ability and status Nutritional status Physical activity Advanced directives List of other physicians Hospitalizations, surgeries, and ER visits in previous 12 months Vitals Screenings to include cognitive, depression, and falls Referrals and appointments  In addition, I have reviewed and discussed with patient certain preventive protocols, quality metrics, and best practice recommendations. A written personalized care plan for preventive services as well as general preventive health recommendations were provided to patient.     Jared Driver, LPN   [id number]   Nurse Notes: Phone visit. Pt states he is doing well. Up to date on health maintenance. Discussed Covid, Shingles and Pneumovax vaccines and how to obtain. 6CIT score of 0.

## 2021-08-08 NOTE — Patient Instructions (Signed)
[name] , Thank you for taking time to come for your Medicare Wellness Visit. I appreciate your ongoing commitment to your health goals. Please review the following plan we discussed and let me know if I can assist you in the future.   Screening recommendations/referrals: Colonoscopy: Done 04/04/2021 Repeat due in 3 years.  Recommended yearly ophthalmology/optometry visit for glaucoma screening and checkup Recommended yearly dental visit for hygiene and checkup  Vaccinations: Influenza vaccine: Done 06/21/2021 Repeat annually  Pneumococcal vaccine: Done 08/05/2009. Second dose due.  Tdap vaccine: Done 01/28/2014 Repeat in 10 years  Shingles vaccine: Shingrix discussed. Please contact your pharmacy for coverage information.     Covid-19: Declined  Advanced directives: Please bring a copy of your health care power of attorney and living will to the office to be added to your chart at your convenience.   Conditions/risks identified: Aim for 30 minutes of exercise or brisk walking each day, drink 6-8 glasses of water and eat lots of fruits and vegetables.   Next appointment: Follow up in one year for your annual wellness visit 2023.  Preventive Care 40-64 Years, Male Preventive care refers to lifestyle choices and visits with your health care provider that can promote health and wellness. What does preventive care include? A yearly physical exam. This is also called an annual well check. Dental exams once or twice a year. Routine eye exams. Ask your health care provider how often you should have your eyes checked. Personal lifestyle choices, including: Daily care of your teeth and gums. Regular physical activity. Eating a healthy diet. Avoiding tobacco and drug use. Limiting alcohol use. Practicing safe sex. Taking low-dose aspirin every day starting at age 74. What happens during an annual well check? The services and screenings done by your health care provider during your  annual well check will depend on your age, overall health, lifestyle risk factors, and family history of disease. Counseling  Your health care provider may ask you questions about your: Alcohol use. Tobacco use. Drug use. Emotional well-being. Home and relationship well-being. Sexual activity. Eating habits. Work and work Statistician. Screening  You may have the following tests or measurements: Height, weight, and BMI. Blood pressure. Lipid and cholesterol levels. These may be checked every 5 years, or more frequently if you are over 71 years old. Skin check. Lung cancer screening. You may have this screening every year starting at age 56 if you have a 30-pack-year history of smoking and currently smoke or have quit within the past 15 years. Fecal occult blood test (FOBT) of the stool. You may have this test every year starting at age 75. Flexible sigmoidoscopy or colonoscopy. You may have a sigmoidoscopy every 5 years or a colonoscopy every 10 years starting at age 73. Prostate cancer screening. Recommendations will vary depending on your family history and other risks. Hepatitis C blood test. Hepatitis B blood test. Sexually transmitted disease (STD) testing. Diabetes screening. This is done by checking your blood sugar (glucose) after you have not eaten for a while (fasting). You may have this done every 1-3 years. Discuss your test results, treatment options, and if necessary, the need for more tests with your health care provider. Vaccines  Your health care provider may recommend certain vaccines, such as: Influenza vaccine. This is recommended every year. Tetanus, diphtheria, and acellular pertussis (Tdap, Td) vaccine. You may need a Td booster every 10 years. Zoster vaccine. You may need this after age 23. Pneumococcal 13-valent conjugate (PCV13) vaccine. You may need  this if you have certain conditions and have not been vaccinated. Pneumococcal polysaccharide (PPSV23) vaccine.  You may need one or two doses if you smoke cigarettes or if you have certain conditions. Talk to your health care provider about which screenings and vaccines you need and how often you need them. This information is not intended to replace advice given to you by your health care provider. Make sure you discuss any questions you have with your health care provider. Document Released: 09/30/2015 Document Revised: 05/23/2016 Document Reviewed: 07/05/2015 Elsevier Interactive Patient Education  2017 [place] Prevention in the Home Falls can cause injuries. They can happen to people of all ages. There are many things you can do to make your home safe and to help prevent falls. What can I do on the outside of my home? Regularly fix the edges of walkways and driveways and fix any cracks. Remove anything that might make you trip as you walk through a door, such as a raised step or threshold. Trim any bushes or trees on the path to your home. Use bright outdoor lighting. Clear any walking paths of anything that might make someone trip, such as rocks or tools. Regularly check to see if handrails are loose or broken. Make sure that both sides of any steps have handrails. Any raised decks and porches should have guardrails on the edges. Have any leaves, snow, or ice cleared regularly. Use sand or salt on walking paths during winter. Clean up any spills in your garage right away. This includes oil or grease spills. What can I do in the bathroom? Use night lights. Install grab bars by the toilet and in the tub and shower. Do not use towel bars as grab bars. Use non-skid mats or decals in the tub or shower. If you need to sit down in the shower, use a plastic, non-slip stool. Keep the floor dry. Clean up any water that spills on the floor as soon as it happens. Remove soap buildup in the tub or shower regularly. Attach bath mats securely with double-sided non-slip rug tape. Do not have throw  rugs and other things on the floor that can make you trip. What can I do in the bedroom? Use night lights. Make sure that you have a light by your bed that is easy to reach. Do not use any sheets or blankets that are too big for your bed. They should not hang down onto the floor. Have a firm chair that has side arms. You can use this for support while you get dressed. Do not have throw rugs and other things on the floor that can make you trip. What can I do in the kitchen? Clean up any spills right away. Avoid walking on wet floors. Keep items that you use a lot in easy-to-reach places. If you need to reach something above you, use a strong step stool that has a grab bar. Keep electrical cords out of the way. Do not use floor polish or wax that makes floors slippery. If you must use wax, use non-skid floor wax. Do not have throw rugs and other things on the floor that can make you trip. What can I do with my stairs? Do not leave any items on the stairs. Make sure that there are handrails on both sides of the stairs and use them. Fix handrails that are broken or loose. Make sure that handrails are as long as the stairways. Check any carpeting to make sure that  it is firmly attached to the stairs. Fix any carpet that is loose or worn. Avoid having throw rugs at the top or bottom of the stairs. If you do have throw rugs, attach them to the floor with carpet tape. Make sure that you have a light switch at the top of the stairs and the bottom of the stairs. If you do not have them, ask someone to add them for you. What else can I do to help prevent falls? Wear shoes that: Do not have high heels. Have rubber bottoms. Are comfortable and fit you well. Are closed at the toe. Do not wear sandals. If you use a stepladder: Make sure that it is fully opened. Do not climb a closed stepladder. Make sure that both sides of the stepladder are locked into place. Ask someone to hold it for you, if  possible. Clearly mark and make sure that you can see: Any grab bars or handrails. First and last steps. Where the edge of each step is. Use tools that help you move around (mobility aids) if they are needed. These include: Canes. Walkers. Scooters. Crutches. Turn on the lights when you go into a dark area. Replace any light bulbs as soon as they burn out. Set up your furniture so you have a clear path. Avoid moving your furniture around. If any of your floors are uneven, fix them. If there are any pets around you, be aware of where they are. Review your medicines with your doctor. Some medicines can make you feel dizzy. This can increase your chance of falling. Ask your doctor what other things that you can do to help prevent falls. This information is not intended to replace advice given to you by your health care provider. Make sure you discuss any questions you have with your health care provider. Document Released: 06/30/2009 Document Revised: 02/09/2016 Document Reviewed: 10/08/2014 Elsevier Interactive Patient Education  2017 [employer].

## 2021-08-13 NOTE — Progress Notes (Signed)
[address], [place] [address]   CLINIC:  Medical Oncology/Hematology  PCP:  [name], MD [address] [place] [address] [phone]   REASON FOR VISIT:  Follow-up for iron deficiency anemia  PRIOR THERAPY: None  CURRENT THERAPY: Oral iron supplementation  INTERVAL HISTORY:  Jared Griffin 60 y.o. male returns for routine follow-up of iron deficiency anemia.  He was last seen by Dr. Chryl Heck on 06/16/2021.  At today's visit, he reports feeling fair.  No recent hospitalizations, surgeries, or changes in baseline health status.  He is currently taking amoxicillin for sinus infection.  He has been taking daily iron pill without any adverse side effects.  He denies any signs of GI blood loss such as hematemesis, hematochezia, or melena.  However, he has chronic sinus issues and follows with ENT for frequent nosebleeds.  Patient reports epistaxis 3-4 times per month that lasts for about 5 minutes.    He has a symptomatic iron deficiency with fatigue, ice pica, and restless legs.  He does also report anginal chest pain and dyspnea on exertion, and reports that he follows with Dr. [name] (cardiologist) for chronic angina.  He has 75% energy and 100% appetite. He endorses that he is maintaining a stable weight.    REVIEW OF SYSTEMS:  Review of Systems  Constitutional:  Positive for fatigue. Negative for appetite change, chills, diaphoresis, fever and unexpected weight change.  HENT:   Positive for nosebleeds. Negative for lump/mass.   Eyes:  Negative for eye problems.  Respiratory:  Positive for cough and shortness of breath (With exertion). Negative for hemoptysis.   Cardiovascular:  Positive for chest pain (None today). Negative for leg swelling and palpitations.  Gastrointestinal:  Negative for abdominal pain, blood in stool, constipation, diarrhea, nausea and vomiting.  Genitourinary:  Negative for hematuria.   Skin:  Negative.   Neurological:  Positive for headaches, light-headedness and numbness (Diabetic neuropathy). Negative for dizziness.  Hematological:  Does not bruise/bleed easily.  Psychiatric/Behavioral:  Positive for depression and sleep disturbance.      PAST MEDICAL/SURGICAL HISTORY:  Past Medical History:  Diagnosis Date   Anxiety    COPD (chronic obstructive pulmonary disease) (HCC)    Coronary atherosclerosis of native coronary artery    a. 12/09/2013: s/p PCI with 3.0 x 28 Promus DES to mLAD   Depression    Diabetic neuropathy ([place])    Elbow injury 07/25/2017   Surgery for torn tendon   Essential hypertension    Fatty liver disease, nonalcoholic    GERD (gastroesophageal reflux disease)    Lateral epicondylitis of right elbow    Mixed hyperlipidemia    Obesity    Osgood-Schlatter's disease of right knee    Skin cyst 10/03/2017   Removed from back   Type 2 diabetes mellitus ([place])    Past Surgical History:  Procedure Laterality Date   BIOPSY  01/21/2019   Procedure: BIOPSY;  Surgeon: [name], MD;  [facility];  Service: Endoscopy;;  gastric bx and gastric polyps   BIOPSY  11/08/2020   Procedure: BIOPSY;  Surgeon: [name], MD;  [facility];  Service: Gastroenterology;;   CARDIAC CATHETERIZATION  2011   CARDIAC CATHETERIZATION N/A 09/20/2015   Procedure: Left Heart Cath and Coronary Angiography;  Surgeon: [name], MD;  Location: [facility];  Service: Cardiovascular;  Laterality: N/A;   CHOLECYSTECTOMY  2003   COLONOSCOPY  06/19/2012  Procedure: COLONOSCOPY;  Surgeon: [name], MD;  [facility];  Service: Endoscopy;  Laterality: N/A;  930   COLONOSCOPY N/A 10/17/2017   Procedure: COLONOSCOPY;  Surgeon: [name], MD;  [facility];  Service: Endoscopy;  Laterality: N/A;  1225   COLONOSCOPY WITH PROPOFOL N/A 11/08/2020   Procedure: COLONOSCOPY WITH PROPOFOL;  Surgeon:  [name], MD;  [facility];  Service: Gastroenterology;  Laterality: N/A;  10:45   COLONOSCOPY WITH PROPOFOL N/A 04/04/2021   Procedure: COLONOSCOPY WITH PROPOFOL;  Surgeon: [name], MD;  [facility];  Service: Gastroenterology;  Laterality: N/A;   CYST EXCISION  07/2019   ESOPHAGOGASTRODUODENOSCOPY (EGD) WITH PROPOFOL N/A 01/21/2019   Procedure: ESOPHAGOGASTRODUODENOSCOPY (EGD) WITH PROPOFOL;  Surgeon: [name], MD;  [facility];  Service: Endoscopy;  Laterality: N/A;  10:15am   ESOPHAGOGASTRODUODENOSCOPY (EGD) WITH PROPOFOL N/A 02/17/2021   Procedure: ESOPHAGOGASTRODUODENOSCOPY (EGD) WITH PROPOFOL;  Surgeon: [name], MD;  [facility];  Service: Gastroenterology;  Laterality: N/A;  10:20   ESOPHAGOGASTRODUODENOSCOPY (EGD) WITH PROPOFOL N/A 04/04/2021   Procedure: ESOPHAGOGASTRODUODENOSCOPY (EGD) WITH PROPOFOL;  Surgeon: [name], MD;  [facility];  Service: Gastroenterology;  Laterality: N/A;  12:30   KNEE ARTHROPLASTY Right 1999   LEFT HEART CATH AND CORONARY ANGIOGRAPHY N/A 02/26/2018   Procedure: LEFT HEART CATH AND CORONARY ANGIOGRAPHY;  Surgeon: [name], MD;  Location: [facility];  Service: Cardiovascular;  Laterality: N/A;   LEFT HEART CATHETERIZATION WITH CORONARY ANGIOGRAM N/A 12/09/2013   Procedure: LEFT HEART CATHETERIZATION WITH CORONARY ANGIOGRAM;  Surgeon: [name], MD;  Location: [facility];  Service: Cardiovascular;  Laterality: N/A;   Liver biopsy     POLYPECTOMY  10/17/2017   Procedure: POLYPECTOMY;  Surgeon: [name], MD;  [facility];  Service: Endoscopy;;  ascending colon, splenic flexure,sigmoid x2   POLYPECTOMY  11/08/2020   Procedure: POLYPECTOMY;  Surgeon: [name], MD;  [facility];  Service: Gastroenterology;;   POLYPECTOMY  04/04/2021   Procedure: POLYPECTOMY;   Surgeon: [name], MD;  [facility];  Service: Gastroenterology;;   TENNIS ELBOW RELEASE/NIRSCHEL PROCEDURE Right 07/25/2017   Procedure: TENNIS ELBOW RELEASE and debriedment;  Surgeon: [name], MD;  Location: [facility];  Service: Orthopedics;  Laterality: Right;   TONSILLECTOMY  1970's     SOCIAL HISTORY:  Social History   Socioeconomic History   Marital status: Married    Spouse name: [name]   Number of children: 1   Years of education: Not on file   Highest education level: Not on file  Occupational History   Not on file  Tobacco Use   Smoking status: Former    Packs/day: 0.50    Years: 10.00    Pack years: 5.00    Types: Cigarettes    Start date: 02/02/1973    Quit date: 09/17/1983    Years since quitting: 37.9   Smokeless tobacco: Former    Types: Chew    Quit date: 09/17/1983  Vaping Use   Vaping Use: Never used  Substance and Sexual Activity   Alcohol use: No    Alcohol/week: 0.0 standard drinks   Drug use: No   Sexual activity: Yes  Other Topics Concern   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    Married with 1 child-son   Right handed    12 th    Very little caffeine   Social Determinants of Radio broadcast assistant Strain: Low Risk    Difficulty of Paying Living Expenses: Not hard at all  Food Insecurity: No Food Insecurity   Worried About Charity fundraiser in the Last Year: Never true   Arboriculturist in the Last Year: Never true  Transportation Needs: No Transportation Needs   Lack of Transportation (Medical): No   Lack of Transportation (Non-Medical): No  Physical Activity: Insufficiently Active   Days of Exercise per Week: 3 days   Minutes of Exercise per Session: 30 min  Stress: No Stress Concern Present   Feeling of Stress : Not at all  Social Connections: Socially Integrated   Frequency of Communication with  Friends and Family: More than three times a week   Frequency of Social Gatherings with Friends and Family: More than three times a week   Attends Religious Services: More than 4 times per year   Active Member of [employer] or Organizations: Yes   Attends Music therapist: More than 4 times per year   Marital Status: Married  Human resources officer Violence: Not At Risk   Fear of Current or Ex-Partner: No   Emotionally Abused: No   Physically Abused: No   Sexually Abused: No    FAMILY HISTORY:  Family History  Problem Relation Age of Onset   Osteoporosis Mother    Cancer Maternal Aunt    CVA Maternal Grandmother    CVA Maternal Grandfather    Healthy Son     CURRENT MEDICATIONS:  Outpatient Encounter Medications as of 08/15/2021  Medication Sig Note   acetaminophen (TYLENOL) 500 MG tablet Take 1,000 mg by mouth every 8 (eight) hours as needed for moderate pain.    albuterol (VENTOLIN HFA) 108 (90 Base) MCG/ACT inhaler INHALE 2 PUFFS INTO LUNGS EVERY 6 HOURS AS NEEDED FOR WHEEZING AND SHORTNESS OF BREATH. (Patient taking differently: Inhale 2 puffs into the lungs every 6 (six) hours as needed for shortness of breath or wheezing. INHALE 2 PUFFS INTO LUNGS EVERY 6 HOURS AS NEEDED FOR WHEEZING AND SHORTNESS OF BREATH.)    ALPRAZolam (XANAX) 1 MG tablet Take 1 tablet (1 mg total) by mouth 3 (three) times daily as needed for anxiety.    amoxicillin-clavulanate (AUGMENTIN) 500-125 MG tablet Take 1 tablet by mouth 3 (three) times daily. 08/08/2021: Prescribed by dentist for oral pain/sinus infection.    aspirin 81 MG EC tablet Take 1 tablet (81 mg total) by mouth daily.    atorvastatin (LIPITOR) 40 MG tablet TAKE ONE TABLET (40MG TOTAL) BY MOUTH DAILY    B-D UF III MINI PEN NEEDLES 31G X 5 MM MISC USE UP TO 4 TIMES DAILY WITH INSULIN AS DIRECTED.    cetirizine (ZYRTEC) 10 MG tablet Take 1 tablet (10 mg total) by mouth daily.    Continuous Blood Gluc Sensor (FREESTYLE LIBRE 14 DAY SENSOR)  MISC Inject 1 each into the skin every 14 (fourteen) days. Use as directed.    dapagliflozin propanediol (FARXIGA) 5 MG TABS tablet Take 5 mg by mouth daily.    dicyclomine (BENTYL) 10 MG capsule Take 1 capsule (10 mg total) by mouth 3 (three) times daily before meals.    Exenatide ER (BYDUREON BCISE) 2 MG/0.85ML AUIJ Inject 2 mg into the skin every Monday.    fluticasone (FLONASE) 50 MCG/ACT nasal  spray Place 2 sprays into both nostrils daily. (Patient taking differently: Place 2 sprays into both nostrils daily as needed for allergies.)    fluticasone-salmeterol (ADVAIR HFA) 115-21 MCG/ACT inhaler Inhale 2 puffs into the lungs 2 (two) times daily.    gabapentin (NEURONTIN) 300 MG capsule Take 2 qam and 2 in the evening.    HUMALOG KWIKPEN 200 UNIT/ML KwikPen Inject 30-35 Units into the skin 3 (three) times daily.    hyoscyamine (LEVSIN SL) 0.125 MG SL tablet Place 1 tablet (0.125 mg total) under the tongue every 4 (four) hours as needed.    ipratropium (ATROVENT) 0.06 % nasal spray Place 2 sprays into both nostrils in the morning and at bedtime.    Lancets MISC 1 each by Does not apply route 4 (four) times daily.    losartan (COZAAR) 25 MG tablet TAKE (1) TABLET BY MOUTH ONCE DAILY. (Patient taking differently: Take 25 mg by mouth daily.)    metFORMIN (GLUCOPHAGE) 500 MG tablet TAKE 2 TABLETS BY MOUTH TWICE DAILY WITH A MEAL. (Patient taking differently: Take 1,000 mg by mouth 2 (two) times daily with a meal.)    methocarbamol (ROBAXIN) 500 MG tablet Take 500 mg by mouth every 8 (eight) hours as needed for muscle spasms.    nitroGLYCERIN (NITROSTAT) 0.4 MG SL tablet Place 1 tablet (0.4 mg total) under the tongue every 5 (five) minutes as needed.    Omega-3 Fatty Acids (FISH OIL) 1200 MG CAPS Take 2 capsules (2,400 mg total) by mouth 2 (two) times daily.    omeprazole (PRILOSEC) 40 MG capsule Take 1 capsule (40 mg total) by mouth daily.    ondansetron (ZOFRAN) 4 MG tablet Take 4 mg by mouth every 8  (eight) hours as needed for nausea or vomiting.    propranolol (INDERAL) 20 MG tablet Take 1 tablet (20 mg total) by mouth 2 (two) times daily.    ranolazine (RANEXA) 1000 MG SR tablet Take 1 tablet (1,000 mg total) by mouth 2 (two) times daily.    sertraline (ZOLOFT) 100 MG tablet Take 1.5 tablets (150 mg total) by mouth daily.    sucralfate (CARAFATE) 1 GM/10ML suspension Take 10 mLs (1 g total) by mouth 4 (four) times daily. (Patient not taking: Reported on 08/08/2021)    TOUJEO MAX SOLOSTAR 300 UNIT/ML Solostar Pen INJECT 130 UNITS INTO THE SKIN AT BEDTIME. (Patient taking differently: Inject 40-50 Units into the skin at bedtime.)    traZODone (DESYREL) 50 MG tablet Take 1 tablet (50 mg total) by mouth at bedtime as needed.    No facility-administered encounter medications on file as of 08/15/2021.    ALLERGIES:  Allergies  Allergen Reactions   Doxycycline     Severe esophagitis     PHYSICAL EXAM:  ECOG PERFORMANCE STATUS: 1 - Symptomatic but completely ambulatory  There were no vitals filed for this visit. There were no vitals filed for this visit. Physical Exam Constitutional:      Appearance: Normal appearance. He is obese.  HENT:     Head: Normocephalic and atraumatic.     Mouth/Throat:     Mouth: Mucous membranes are moist.  Eyes:     Extraocular Movements: Extraocular movements intact.     Pupils: Pupils are equal, round, and reactive to light.  Cardiovascular:     Rate and Rhythm: Normal rate and regular rhythm.     Pulses: Normal pulses.     Heart sounds: Normal heart sounds.  Pulmonary:     Effort: Pulmonary  effort is normal.     Breath sounds: Normal breath sounds.  Abdominal:     General: Bowel sounds are normal.     Palpations: Abdomen is soft.     Tenderness: There is no abdominal tenderness.  Musculoskeletal:        General: No swelling.     Right lower leg: No edema.     Left lower leg: No edema.  Lymphadenopathy:     Cervical: No cervical  adenopathy.  Skin:    General: Skin is warm and dry.  Neurological:     General: No focal deficit present.     Mental Status: He is alert and oriented to person, place, and time.  Psychiatric:        Mood and Affect: Mood normal.        Behavior: Behavior normal.     LABORATORY DATA:  I have reviewed the labs as listed.  CBC    Component Value Date/Time   WBC 6.6 06/20/2021 0839   RBC 5.20 06/20/2021 0839   HGB 13.7 06/20/2021 0839   HGB 14.0 04/05/2021 1016   HCT 43.1 06/20/2021 0839   HCT 43.4 06/20/2021 0839   PLT 179 06/20/2021 0839   PLT 235 04/05/2021 1016   MCV 83.5 06/20/2021 0839   MCV 81 04/05/2021 1016   MCH 26.3 06/20/2021 0839   MCHC 31.6 06/20/2021 0839   RDW 14.1 06/20/2021 0839   RDW 13.6 04/05/2021 1016   LYMPHSABS 2.4 06/20/2021 0839   LYMPHSABS 1.7 04/05/2021 1016   MONOABS 0.6 06/20/2021 0839   EOSABS 0.3 06/20/2021 0839   EOSABS 0.3 04/05/2021 1016   BASOSABS 0.1 06/20/2021 0839   BASOSABS 0.1 04/05/2021 1016   CMP Latest Ref Rng & Units 06/20/2021 05/25/2021 05/25/2021  Glucose 70 - 99 mg/dL 241(H) 212(H) -  BUN 6 - 20 mg/dL 14 15 -  Creatinine 0.61 - 1.24 mg/dL 0.91 0.98 -  Sodium 135 - 145 mmol/L 135 137 -  Potassium 3.5 - 5.1 mmol/L 3.9 4.2 -  Chloride 98 - 111 mmol/L 101 99 -  CO2 22 - 32 mmol/L 24 21 -  Calcium 8.9 - 10.3 mg/dL 9.4 9.7 -  Total Protein 6.5 - 8.1 g/dL 7.3 7.2 7.2  Total Bilirubin 0.3 - 1.2 mg/dL 0.8 0.5 0.5  Alkaline Phos 38 - 126 U/L 46 66 62  AST 15 - 41 U/L 45(H) 36 39  ALT 0 - 44 U/L 49(H) 51(H) 54(H)    DIAGNOSTIC IMAGING:  I have independently reviewed the relevant imaging and discussed with the patient.  ASSESSMENT & PLAN: 1.  Iron deficiency anemia - Seen for initial consultation by Dr. Chryl Heck on 06/16/2021 - EGD (04/04/2021): Normal esophagus, previous pill esophageal ulcer has healed; multiple gastric polyps, normal duodenum - Colonoscopy (04/04/2021): Inadequate prep, external and internal hemorrhoids, polyps,  medium sized lipoma in transverse colon - No gross GI hemorrhage - denies bright red blood per rectum and melena - Has nosebleeds 3-4 times per month that last about 5 minutes, follows with ENT - Takes daily iron pill without adverse side effects.  Also takes omeprazole daily. - Symptomatic with fatigue, pica, RLS - Initial labs (06/20/2021) show normal Hgb 13.7/MCV 83.5, but significant iron deficiency with ferritin 12, iron saturation 7%, and TIBC 549.  B12 and folate were normal. - Most recent labs (08/15/2021): Normal Hgb 15.6, low ferritin 17, iron saturation 16% with elevated TIBC 561 - Suspect iron deficiency from frequent epistaxis, compounded by malabsorption in the  setting of chronic disease and PPI use. - PLAN: Due to persistent iron deficiency despite oral iron supplementation, recommend IV iron with Feraheme x2. - Repeat labs and RTC in 4 months.   PLAN SUMMARY & DISPOSITION: IV Feraheme x2 Labs in 4 months Office visit after labs  All questions were answered. The patient knows to call the clinic with any problems, questions or concerns.  Medical decision making: Moderate  Time spent on visit: I spent 20 minutes counseling the patient face to face. The total time spent in the appointment was 30 minutes and more than 50% was on counseling.   Harriett Rush, PA-C  08/15/2021 9:37 AM

## 2021-08-14 ENCOUNTER — Other Ambulatory Visit (HOSPITAL_COMMUNITY): Payer: Self-pay | Admitting: Physician Assistant

## 2021-08-14 DIAGNOSIS — D509 Iron deficiency anemia, unspecified: Secondary | ICD-10-CM

## 2021-08-15 ENCOUNTER — Other Ambulatory Visit: Payer: Self-pay

## 2021-08-15 ENCOUNTER — Encounter (HOSPITAL_COMMUNITY): Payer: Self-pay | Admitting: Physician Assistant

## 2021-08-15 ENCOUNTER — Inpatient Hospital Stay (HOSPITAL_COMMUNITY): Payer: Medicare Other

## 2021-08-15 ENCOUNTER — Inpatient Hospital Stay (HOSPITAL_COMMUNITY): Payer: Medicare Other | Attending: Hematology | Admitting: Physician Assistant

## 2021-08-15 VITALS — BP 138/89 | HR 87 | Temp 96.9°F | Resp 18 | Wt 266.9 lb

## 2021-08-15 DIAGNOSIS — F32A Depression, unspecified: Secondary | ICD-10-CM | POA: Insufficient documentation

## 2021-08-15 DIAGNOSIS — J449 Chronic obstructive pulmonary disease, unspecified: Secondary | ICD-10-CM | POA: Diagnosis not present

## 2021-08-15 DIAGNOSIS — K760 Fatty (change of) liver, not elsewhere classified: Secondary | ICD-10-CM | POA: Insufficient documentation

## 2021-08-15 DIAGNOSIS — Z7985 Long-term (current) use of injectable non-insulin antidiabetic drugs: Secondary | ICD-10-CM | POA: Diagnosis not present

## 2021-08-15 DIAGNOSIS — Z7984 Long term (current) use of oral hypoglycemic drugs: Secondary | ICD-10-CM | POA: Insufficient documentation

## 2021-08-15 DIAGNOSIS — Z7982 Long term (current) use of aspirin: Secondary | ICD-10-CM | POA: Insufficient documentation

## 2021-08-15 DIAGNOSIS — E782 Mixed hyperlipidemia: Secondary | ICD-10-CM | POA: Insufficient documentation

## 2021-08-15 DIAGNOSIS — G2581 Restless legs syndrome: Secondary | ICD-10-CM | POA: Insufficient documentation

## 2021-08-15 DIAGNOSIS — R5383 Other fatigue: Secondary | ICD-10-CM | POA: Insufficient documentation

## 2021-08-15 DIAGNOSIS — F5089 Other specified eating disorder: Secondary | ICD-10-CM | POA: Diagnosis not present

## 2021-08-15 DIAGNOSIS — Z87891 Personal history of nicotine dependence: Secondary | ICD-10-CM | POA: Diagnosis not present

## 2021-08-15 DIAGNOSIS — D509 Iron deficiency anemia, unspecified: Secondary | ICD-10-CM

## 2021-08-15 DIAGNOSIS — K219 Gastro-esophageal reflux disease without esophagitis: Secondary | ICD-10-CM | POA: Insufficient documentation

## 2021-08-15 DIAGNOSIS — I10 Essential (primary) hypertension: Secondary | ICD-10-CM | POA: Diagnosis not present

## 2021-08-15 DIAGNOSIS — I25119 Atherosclerotic heart disease of native coronary artery with unspecified angina pectoris: Secondary | ICD-10-CM | POA: Insufficient documentation

## 2021-08-15 DIAGNOSIS — E1140 Type 2 diabetes mellitus with diabetic neuropathy, unspecified: Secondary | ICD-10-CM | POA: Insufficient documentation

## 2021-08-15 DIAGNOSIS — Z79899 Other long term (current) drug therapy: Secondary | ICD-10-CM | POA: Insufficient documentation

## 2021-08-15 HISTORY — DX: Iron deficiency anemia, unspecified: D50.9

## 2021-08-15 LAB — CBC WITH DIFFERENTIAL/PLATELET
Abs Immature Granulocytes: 0.03 10*3/uL (ref 0.00–0.07)
Basophils Absolute: 0.1 10*3/uL (ref 0.0–0.1)
Basophils Relative: 1 %
Eosinophils Absolute: 0.2 10*3/uL (ref 0.0–0.5)
Eosinophils Relative: 3 %
HCT: 45.9 % (ref 39.0–52.0)
Hemoglobin: 15.6 g/dL (ref 13.0–17.0)
Immature Granulocytes: 0 %
Lymphocytes Relative: 36 %
Lymphs Abs: 2.8 10*3/uL (ref 0.7–4.0)
MCH: 29 pg (ref 26.0–34.0)
MCHC: 34 g/dL (ref 30.0–36.0)
MCV: 85.3 fL (ref 80.0–100.0)
Monocytes Absolute: 0.8 10*3/uL (ref 0.1–1.0)
Monocytes Relative: 10 %
Neutro Abs: 3.9 10*3/uL (ref 1.7–7.7)
Neutrophils Relative %: 50 %
Platelets: 178 10*3/uL (ref 150–400)
RBC: 5.38 MIL/uL (ref 4.22–5.81)
RDW: 15.2 % (ref 11.5–15.5)
WBC: 7.7 10*3/uL (ref 4.0–10.5)
nRBC: 0 % (ref 0.0–0.2)

## 2021-08-15 LAB — IRON AND TIBC
Iron: 89 ug/dL (ref 45–182)
Saturation Ratios: 16 % — ABNORMAL LOW (ref 17.9–39.5)
TIBC: 561 ug/dL — ABNORMAL HIGH (ref 250–450)
UIBC: 472 ug/dL

## 2021-08-15 LAB — FERRITIN: Ferritin: 17 ng/mL — ABNORMAL LOW (ref 24–336)

## 2021-08-15 NOTE — Patient Instructions (Signed)
[place] at [facility] Discharge Instructions  You were seen today by Tarri Abernethy PA-C for your iron deficiency.  Your blood levels look great, but your iron levels remain low despite taking the iron pill at home.  We recommend that you receive IV iron infusions to improve your iron levels.  LABS: Return in 4 months for repeat labs  OTHER TESTS: None at this time  TREATMENT: - IV iron (Feraheme) x2 doses - You can stop taking iron pill at home  FOLLOW-UP APPOINTMENT: Office visit in 4 months after labs   Thank you for choosing [place] at [facility] to provide your oncology and hematology care.  To afford each patient quality time with our provider, please arrive at least 15 minutes before your scheduled appointment time.   If you have a lab appointment with the [facility] please come in thru the Main Entrance and check in at the main information desk.  You need to re-schedule your appointment should you arrive 10 or more minutes late.  We strive to give you quality time with our providers, and arriving late affects you and other patients whose appointments are after yours.  Also, if you no show three or more times for appointments you may be dismissed from the clinic at the providers discretion.     Again, thank you for choosing [facility].  Our hope is that these requests will decrease the amount of time that you wait before being seen by our physicians.       _____________________________________________________________  Should you have questions after your visit to [facility], please contact our office at [phone] and follow the prompts.  Our office hours are 8:00 a.m. and 4:30 p.m. Monday - Friday.  Please note that voicemails left after 4:00 p.m. may not be returned until the following business day.  We are closed weekends and major holidays.  You do have access to a nurse 24-7, just call the  main number to the clinic [phone] and do not press any options, hold on the line and a nurse will answer the phone.    For prescription refill requests, have your pharmacy contact our office and allow 72 hours.    Due to Covid, you will need to wear a mask upon entering the hospital. If you do not have a mask, a mask will be given to you at the Main Entrance upon arrival. For doctor visits, patients may have 1 support person age 46 or older with them. For treatment visits, patients can not have anyone with them due to social distancing guidelines and our immunocompromised population.

## 2021-08-16 ENCOUNTER — Telehealth (INDEPENDENT_AMBULATORY_CARE_PROVIDER_SITE_OTHER): Payer: Self-pay

## 2021-08-16 NOTE — Telephone Encounter (Signed)
Patient called today stating he has finished Xifaxan the last few days, which did not help much. Per patient he has been having a burning sensation in epi gastric region and diarrhea for the last few days. He has been unable to sleep the last three nights. He feels nauseated, denies vomiting has 7-8 bm's per day that are all diarrhea and are not formed at all. Denies any blood in stools, but has seen spots that are darker than the other. Denies a fever. He is taking Dicyclomine 10 mg tid,ondansetron 4 mg sublingual Q 8 hours prn, He has taken IB guard the last two days, which seems to help some. He also is taking omeprazole 40 mg once per day. He has not taken Sucralfate in a while. Please advise.

## 2021-08-16 NOTE — Telephone Encounter (Signed)
He can increase his omeprazole to twice a day if he feels that this has improved his symptoms but cannot take the IBgard up to 6 times per day if he has felt an improvement of his symptoms with this.  Can also take some Imodium to relieve his episode of diarrhea.  We will discussed with him possibly starting a TCA in his follow-up appointment but will need to discuss this formally in a visit.  If his symptoms worsen, we can try to get him an earlier appointment in the clinic.

## 2021-08-17 DIAGNOSIS — M79671 Pain in right foot: Secondary | ICD-10-CM | POA: Diagnosis not present

## 2021-08-17 DIAGNOSIS — M79672 Pain in left foot: Secondary | ICD-10-CM | POA: Diagnosis not present

## 2021-08-17 DIAGNOSIS — M792 Neuralgia and neuritis, unspecified: Secondary | ICD-10-CM | POA: Diagnosis not present

## 2021-08-17 DIAGNOSIS — G5790 Unspecified mononeuropathy of unspecified lower limb: Secondary | ICD-10-CM | POA: Diagnosis not present

## 2021-08-18 NOTE — Telephone Encounter (Signed)
I spoke with thte patient he is aware of all and states he is feeling better on the Ib guard and he has been watching diet. He will keep pending appointment and if symptoms worsen will call back for an earlier appointment.

## 2021-08-18 NOTE — Telephone Encounter (Signed)
Called and left a message asked that he return call.

## 2021-08-22 DIAGNOSIS — M47812 Spondylosis without myelopathy or radiculopathy, cervical region: Secondary | ICD-10-CM | POA: Diagnosis not present

## 2021-08-22 DIAGNOSIS — M546 Pain in thoracic spine: Secondary | ICD-10-CM | POA: Diagnosis not present

## 2021-08-22 DIAGNOSIS — G43001 Migraine without aura, not intractable, with status migrainosus: Secondary | ICD-10-CM | POA: Diagnosis not present

## 2021-08-22 DIAGNOSIS — M9901 Segmental and somatic dysfunction of cervical region: Secondary | ICD-10-CM | POA: Diagnosis not present

## 2021-08-22 DIAGNOSIS — E1165 Type 2 diabetes mellitus with hyperglycemia: Secondary | ICD-10-CM | POA: Diagnosis not present

## 2021-08-23 ENCOUNTER — Other Ambulatory Visit: Payer: Self-pay | Admitting: Cardiology

## 2021-08-24 ENCOUNTER — Inpatient Hospital Stay (HOSPITAL_COMMUNITY): Payer: Medicare Other | Attending: Hematology

## 2021-08-24 ENCOUNTER — Other Ambulatory Visit: Payer: Self-pay

## 2021-08-24 VITALS — BP 146/64 | HR 88 | Temp 98.7°F | Resp 18 | Ht 74.0 in | Wt 265.0 lb

## 2021-08-24 DIAGNOSIS — D509 Iron deficiency anemia, unspecified: Secondary | ICD-10-CM | POA: Insufficient documentation

## 2021-08-24 MED ORDER — SODIUM CHLORIDE 0.9 % IV SOLN
510.0000 mg | Freq: Once | INTRAVENOUS | Status: AC
  Administered 2021-08-24: 510 mg via INTRAVENOUS
  Filled 2021-08-24: qty 510

## 2021-08-24 MED ORDER — ACETAMINOPHEN 325 MG PO TABS
650.0000 mg | ORAL_TABLET | Freq: Once | ORAL | Status: AC
  Administered 2021-08-24: 650 mg via ORAL
  Filled 2021-08-24: qty 2

## 2021-08-24 MED ORDER — LORATADINE 10 MG PO TABS
10.0000 mg | ORAL_TABLET | Freq: Once | ORAL | Status: AC
  Administered 2021-08-24: 10 mg via ORAL
  Filled 2021-08-24: qty 1

## 2021-08-24 MED ORDER — SODIUM CHLORIDE 0.9 % IV SOLN: Freq: Once | INTRAVENOUS | Status: AC

## 2021-08-24 NOTE — Progress Notes (Signed)
Patient presents today for Feraheme infusion per providers order.  Vital signs WNL.  Patient has no new complaints at this time.    Peripheral IV started and blood return noted pre and post infusion.  Feraheme infusion  given today per MD orders.  Stable during infusion without adverse affects.  Vital signs stable.  No complaints at this time.  Discharge from clinic ambulatory in stable condition.  Alert and oriented X 3.  Follow up with [facility] as scheduled.

## 2021-08-24 NOTE — Patient Instructions (Signed)
Jared Griffin  Discharge Instructions: Thank you for choosing [facility] to provide your oncology and hematology care.  If you have a lab appointment with the [facility], please come in thru the Main Entrance and check in at the main information desk.  Wear comfortable clothing and clothing appropriate for easy access to any Portacath or PICC line.   We strive to give you quality time with your provider. You may need to reschedule your appointment if you arrive late (15 or more minutes).  Arriving late affects you and other patients whose appointments are after yours.  Also, if you miss three or more appointments without notifying the office, you may be dismissed from the clinic at the provider's discretion.      For prescription refill requests, have your pharmacy contact our office and allow 72 hours for refills to be completed.    Today you received the following chemotherapy and/or immunotherapy agents Feraheme      To help prevent nausea and vomiting after your treatment, we encourage you to take your nausea medication as directed.  BELOW ARE SYMPTOMS THAT SHOULD BE REPORTED IMMEDIATELY: *FEVER GREATER THAN 100.4 F (38 C) OR HIGHER *CHILLS OR SWEATING *NAUSEA AND VOMITING THAT IS NOT CONTROLLED WITH YOUR NAUSEA MEDICATION *UNUSUAL SHORTNESS OF BREATH *UNUSUAL BRUISING OR BLEEDING *URINARY PROBLEMS (pain or burning when urinating, or frequent urination) *BOWEL PROBLEMS (unusual diarrhea, constipation, pain near the anus) TENDERNESS IN MOUTH AND THROAT WITH OR WITHOUT PRESENCE OF ULCERS (sore throat, sores in mouth, or a toothache) UNUSUAL RASH, SWELLING OR PAIN  UNUSUAL VAGINAL DISCHARGE OR ITCHING   Items with * indicate a potential emergency and should be followed up as soon as possible or go to the Emergency Department if any problems should occur.  Please show the CHEMOTHERAPY ALERT CARD or IMMUNOTHERAPY ALERT CARD at check-in to the Emergency  Department and triage nurse.  Should you have questions after your visit or need to cancel or reschedule your appointment, please contact [facility] [phone]  and follow the prompts.  Office hours are 8:00 a.m. to 4:30 p.m. Monday - Friday. Please note that voicemails left after 4:00 p.m. may not be returned until the following business day.  We are closed weekends and major holidays. You have access to a nurse at all times for urgent questions. Please call the main number to the clinic [phone] and follow the prompts.  For any non-urgent questions, you may also contact your provider using MyChart. We now offer e-Visits for anyone 21 and older to request care online for non-urgent symptoms. For details visit [email or web address].   Also download the MyChart app! Go to the app store, search "MyChart", open the app, select [facility], and log in with your MyChart username and password.  Due to Covid, a mask is required upon entering the hospital/clinic. If you do not have a mask, one will be given to you upon arrival. For doctor visits, patients may have 1 support person aged 67 or older with them. For treatment visits, patients cannot have anyone with them due to current Covid guidelines and our immunocompromised population.

## 2021-08-25 ENCOUNTER — Ambulatory Visit: Payer: Medicare Other | Admitting: Pharmacist

## 2021-08-25 DIAGNOSIS — E1159 Type 2 diabetes mellitus with other circulatory complications: Secondary | ICD-10-CM

## 2021-08-25 DIAGNOSIS — I10 Essential (primary) hypertension: Secondary | ICD-10-CM

## 2021-08-25 DIAGNOSIS — E781 Pure hyperglyceridemia: Secondary | ICD-10-CM

## 2021-08-25 DIAGNOSIS — E785 Hyperlipidemia, unspecified: Secondary | ICD-10-CM

## 2021-08-25 DIAGNOSIS — I779 Disorder of arteries and arterioles, unspecified: Secondary | ICD-10-CM

## 2021-08-25 NOTE — Patient Instructions (Signed)
[name],  It was great to talk to you today!  Please call me with any questions or concerns.   Visit Information  Following are the goals we discussed today:  Patient Goals/Self-Care Activities Over the next 90 days, patient will:  Take medications as prescribed Check blood sugar continuously with continuous glucose monitor, document, and provide at future appointments Check blood pressure at least once daily, document, and provide at future appointments Engage in dietary modifications by fewer sweetened foods & beverages  Plan: Telephone follow up appointment with care management team member scheduled for:  10/27/21  Kennon Holter, PharmD, BCACP, CPP Clinical Pharmacist Practitioner [place] [phone]   Please call the care guide team at [phone] if you need to cancel or reschedule your appointment.   Patient verbalizes understanding of instructions provided today and agrees to view in [place].

## 2021-08-25 NOTE — Chronic Care Management (AMB) (Signed)
Chronic Care Management Pharmacy Note  08/25/2021 Name:  Jared Griffin MRN:  [id number] DOB:  May 10, 1961  Summary:  Hypertension: Recently switched from bisoprolol to propranolol due to tremor  Coronary artery disease s/p DES 2015/Hyperlipidemia/Angina (Followed by Dr. [name]): Triglycerides well above goal of <150 per 2020 AACE/ACE guidelines Would prefer for patient to have prescription strength fish oil such as Vascepa (icosapent ethyl) or Lovaza (omega-3 ethyl esters); however, these agent are $100 per month on the patient's insurance. PAN foundation for hypercholesterolemia fund currently closed In order to achieve similar dose of EPA to Vascepa (2g twice daily), patient would need to take 4-8 capsules twice daily of an over the counter fish oil depending on the brand. Patient is currently taking over the counter fish oil 2 capsules twice daily. Will plan to re-check lipid panel in 4-12 weeks to determine if further increase is necessary to decrease triglycerides to goal.  Subjective: Jared Griffin is an 60 y.o. year old male who is a primary patient of Luking, Elayne Snare, MD.  The CCM team was consulted for assistance with disease management and care coordination needs.    Engaged with patient's caregiver (wife [name]) by telephone for follow up visit in response to provider referral for pharmacy case management and/or care coordination services.   Consent to Services:  The patient was given information about Chronic Care Management services, agreed to services, and gave verbal consent prior to initiation of services.  Please see initial visit note for detailed documentation.   Patient Care Team: [name], MD as PCP - General (Family Medicine) Satira Sark, MD as PCP - Cardiology (Cardiology) Allene Pyo, DPM as Consulting Physician (Podiatry) Cloria Spring, MD as Consulting Physician ([place]) Beryle Lathe, [facility]  (Pharmacist)  Objective:  Lab Results  Component Value Date   CREATININE 0.91 06/20/2021   CREATININE 0.98 05/25/2021   CREATININE 1.08 04/05/2021    Lab Results  Component Value Date   HGBA1C 6.6 10/18/2020   Last diabetic Eye exam:  Lab Results  Component Value Date/Time   HMDIABEYEEXA No Retinopathy 08/05/2019 12:00 AM    Last diabetic Foot exam: No results found for: HMDIABFOOTEX      Component Value Date/Time   CHOL 172 05/25/2021 0958   TRIG 455 (H) 05/25/2021 0958   HDL 29 (L) 05/25/2021 0958   CHOLHDL 5.9 (H) 05/25/2021 0958   CHOLHDL 6.4 07/12/2018 0713   VLDL UNABLE TO CALCULATE IF TRIGLYCERIDE OVER 400 mg/dL 07/12/2018 0713   LDLCALC 71 05/25/2021 0958    Hepatic Function Latest Ref Rng & Units 06/20/2021 05/25/2021 05/25/2021  Total Protein 6.5 - 8.1 g/dL 7.3 7.2 7.2  Albumin 3.5 - 5.0 g/dL 4.1 4.5 4.8  AST 15 - 41 U/L 45(H) 36 39  ALT 0 - 44 U/L 49(H) 51(H) 54(H)  Alk Phosphatase 38 - 126 U/L 46 66 62  Total Bilirubin 0.3 - 1.2 mg/dL 0.8 0.5 0.5  Bilirubin, Direct 0.00 - 0.40 mg/dL - 0.18 0.19    Lab Results  Component Value Date/Time   TSH 0.648 08/20/2019 09:37 AM   TSH 0.65 08/20/2019 12:00 AM   TSH 0.525 07/12/2018 07:13 AM   TSH 0.458 04/13/2017 08:48 AM   FREET4 1.35 08/20/2019 09:37 AM    CBC Latest Ref Rng & Units 08/15/2021 06/20/2021 06/20/2021  WBC 4.0 - 10.5 K/uL 7.7 6.6 -  Hemoglobin 13.0 - 17.0 g/dL 15.6 13.7 -  Hematocrit 39.0 - 52.0 % 45.9 43.4  43.1  Platelets 150 - 400 K/uL 178 179 -    Lab Results  Component Value Date/Time   VD25OH 18.6 (L) 08/20/2019 09:37 AM   VD25OH 18.6 08/20/2019 12:00 AM    Clinical ASCVD: No  The 10-year ASCVD risk score (Arnett DK, et al., 2019) is: 26.8%   Values used to calculate the score:     Age: 54 years     Sex: Male     Is Non-Hispanic African American: No     Diabetic: Yes     Tobacco smoker: No     Systolic Blood Pressure: 401 mmHg     Is BP treated: Yes     HDL Cholesterol: 29  mg/dL     Total Cholesterol: 172 mg/dL    Social History   Tobacco Use  Smoking Status Former   Packs/day: 0.50   Years: 10.00   Pack years: 5.00   Types: Cigarettes   Start date: 02/02/1973   Quit date: 09/17/1983   Years since quitting: 37.9  Smokeless Tobacco Former   Types: Chew   Quit date: 09/17/1983   BP Readings from Last 3 Encounters:  08/24/21 (!) 146/64  08/15/21 138/89  07/28/21 128/70   Pulse Readings from Last 3 Encounters:  08/24/21 88  08/15/21 87  07/28/21 84   Wt Readings from Last 3 Encounters:  08/24/21 265 lb (120.2 kg)  08/15/21 266 lb 14.4 oz (121.1 kg)  08/08/21 268 lb (121.6 kg)    Assessment: Review of patient past medical history, allergies, medications, health status, including review of consultants reports, laboratory and other test data, was performed as part of comprehensive evaluation and provision of chronic care management services.   SDOH:  (Social Determinants of Health) assessments and interventions performed:    CCM Care Plan  Allergies  Allergen Reactions   Doxycycline     Severe esophagitis    Medications Reviewed Today     Reviewed by Beryle Lathe, [facility] (Pharmacist) on 08/25/21 at 1438  Med List Status: <None>   Medication Order Taking? Sig Documenting Provider Last Dose Status Informant  acetaminophen (TYLENOL) 500 MG tablet [id number] Yes Take 1,000 mg by mouth every 8 (eight) hours as needed for moderate pain. [provider] Taking Active Self  albuterol (VENTOLIN HFA) 108 (90 Base) MCG/ACT inhaler [id number] Yes INHALE 2 PUFFS INTO LUNGS EVERY 6 HOURS AS NEEDED FOR WHEEZING AND SHORTNESS OF BREATH.  Patient taking differently: Inhale 2 puffs into the lungs every 6 (six) hours as needed for shortness of breath or wheezing. INHALE 2 PUFFS INTO LUNGS EVERY 6 HOURS AS NEEDED FOR WHEEZING AND SHORTNESS OF BREATH.   Mikey Kirschner, MD Taking Active            Med Note Waldo Laine, Gwenyth Allegra   Fri May 26, 2021  3:35 PM)    ALPRAZolam Duanne Moron) 1 MG tablet [id number] Yes Take 1 tablet (1 mg total) by mouth 3 (three) times daily as needed for anxiety. Cloria Spring, MD Taking Active   amoxicillin-clavulanate (AUGMENTIN) 500-125 MG tablet [id number] No Take 1 tablet by mouth 3 (three) times daily. [provider] Unknown Active            Med Note Langston Masker, Doyle Askew   Tue Aug 08, 2021  1:37 PM) Prescribed by dentist for oral pain/sinus infection.   aspirin 81 MG EC tablet [id number] Yes Take 1 tablet (81 mg total) by mouth daily. [name], MD Taking Active Self  atorvastatin (LIPITOR) 40 MG tablet [id number] Yes TAKE ONE TABLET (40MG TOTAL) BY MOUTH DAILY Satira Sark, MD Taking Active   B-D UF III MINI PEN NEEDLES 31G X 5 MM MISC [id number]  USE UP TO 4 TIMES DAILY WITH INSULIN AS DIRECTED. Cassandria Anger, MD  Active Self  cetirizine (ZYRTEC) 10 MG tablet [id number] Yes Take 1 tablet (10 mg total) by mouth daily. Stacey Drain, [place], PA-C Taking Active Self  Continuous Blood Gluc Sensor (FREESTYLE LIBRE 14 DAY SENSOR) [place] [id number]  Inject 1 each into the skin every 14 (fourteen) days. Use as directed. Cassandria Anger, MD  Active Self  dapagliflozin propanediol (FARXIGA) 5 MG TABS tablet [id number] Yes Take 5 mg by mouth daily. [provider] Taking Active Self           Med Note Wilmon Pali, MELISSA R   Wed Sep 14, 2020  9:57 AM)    dicyclomine (BENTYL) 10 MG capsule [id number] Yes Take 1 capsule (10 mg total) by mouth 3 (three) times daily before meals. Gabriel Rung, NP Taking Active   Exenatide ER (BYDUREON BCISE) 2 MG/0.85ML AUIJ [id number] Yes Inject 2 mg into the skin every Monday. [provider] Taking Active Self  fluticasone (FLONASE) 50 MCG/ACT nasal spray [id number] Yes Place 2 sprays into both nostrils daily.  Patient taking differently: Place 2 sprays into both nostrils daily as needed for allergies.   Stacey Drain [place], PA-C Taking Active             Med Note Jilda Roche A   Wed Nov 02, 2020  1:33 PM)    fluticasone-salmeterol (ADVAIR [facility]) 115-21 MCG/ACT inhaler [id number] Yes Inhale 2 puffs into the lungs 2 (two) times daily. Mikey Kirschner, MD Taking Active Self  gabapentin (NEURONTIN) 300 MG capsule [id number] Yes Take 2 qam and 2 in the evening. [name], MD Taking Active   HUMALOG KWIKPEN 200 UNIT/ML KwikPen [id number] Yes Inject 30-35 Units into the skin 3 (three) times daily. [provider] Taking Active Self  hyoscyamine (LEVSIN SL) 0.125 MG SL tablet [id number] Yes Place 1 tablet (0.125 mg total) under the tongue every 4 (four) hours as needed. Gabriel Rung, NP Taking Active   ipratropium (ATROVENT) 0.06 % nasal spray [id number] Yes Place 2 sprays into both nostrils in the morning and at bedtime. [provider] Taking Active Self  Lancets [place] [id number]  1 each by Does not apply route 4 (four) times daily. Cassandria Anger, MD  Active Self  losartan (COZAAR) 25 MG tablet [id number] Yes TAKE (1) TABLET BY MOUTH ONCE DAILY.  Patient taking differently: Take 25 mg by mouth daily.   Satira Sark, MD Taking Active   metFORMIN (GLUCOPHAGE) 500 MG tablet [id number] Yes TAKE 2 TABLETS BY MOUTH TWICE DAILY WITH A MEAL.  Patient taking differently: Take 1,000 mg by mouth 2 (two) times daily with a meal.   Nida, Marella Chimes, MD Taking Active   methocarbamol (ROBAXIN) 500 MG tablet [id number] Yes Take 500 mg by mouth every 8 (eight) hours as needed for muscle spasms. [provider] Taking Active   nitroGLYCERIN (NITROSTAT) 0.4 MG SL tablet [id number] No Place 1 tablet (0.4 mg total) under the tongue every 5 (five) minutes as needed. Satira Sark, MD Unknown Active   Omega-3 Fatty Acids (FISH OIL) 1200 MG CAPS [id number] Yes Take 2 capsules (2,400 mg total) by mouth 2 (two) times daily. [name], MD Taking Active   omeprazole ([place])  40 MG capsule [id number] Yes Take 1  capsule (40 mg total) by mouth daily. Montez Morita, Quillian Quince, MD Taking Active   ondansetron [facility]) 4 MG tablet [id number] Yes Take 4 mg by mouth every 8 (eight) hours as needed for nausea or vomiting. [provider] Taking Active   propranolol (INDERAL) 20 MG tablet [id number] Yes Take 1 tablet (20 mg total) by mouth 2 (two) times daily. Ludwig Clarks, DO Taking Active   ranolazine (RANEXA) 1000 MG SR tablet [id number] Yes TAKE ONE TABLET (1000MG TOTAL) BY MOUTH TWO TIMES DAILY Satira Sark, MD Taking Active   sertraline (ZOLOFT) 100 MG tablet [id number]  Take 1.5 tablets (150 mg total) by mouth daily. Cloria Spring, MD  Active   sucralfate (CARAFATE) 1 GM/10ML suspension [id number] Yes Take 10 mLs (1 g total) by mouth 4 (four) times daily. Montez Morita, Quillian Quince, MD Taking Active   TOUJEO MAX SOLOSTAR 300 UNIT/ML Solostar Pen [id number] Yes INJECT 130 UNITS INTO THE SKIN AT BEDTIME.  Patient taking differently: Inject 40-50 Units into the skin at bedtime.   Cassandria Anger, MD Taking Active            Med Note Waldo Laine, Gwenyth Allegra   Fri May 26, 2021  3:35 PM)    traZODone (DESYREL) 50 MG tablet [id number] Yes Take 1 tablet (50 mg total) by mouth at bedtime as needed. Cloria Spring, MD Taking Active             Patient Active Problem List   Diagnosis Date Noted   Iron deficiency anemia 08/15/2021   Irritable bowel syndrome with diarrhea 07/18/2021   Orthostatic hypotension 12/07/2020   IBS (irritable bowel syndrome) 11/17/2020   Chronic diarrhea 08/17/2020   NASH (nonalcoholic steatohepatitis) 08/17/2020   Elevated LFTs 08/17/2020   Tremor of both hands 05/06/2020   Bilateral occipital neuralgia 05/06/2020   Abdominal pain, chronic, epigastric 01/13/2019   Anxiety 01/04/2019   Radicular pain of left lower extremity 12/22/2018   Moderate persistent asthma 10/25/2018   Dizziness 07/12/2018   Nonintractable headache    Abnormal nuclear stress test     Aftercare following surgery 07/25/17 08/13/2017   Hx of colonic polyps 08/07/2017   Lateral epicondylitis of right elbow    Cervical nerve root impingement 12/09/2015   Generalized anxiety disorder 09/27/2015   Chest pain 09/20/2015   Pain in the chest    Depression 01/24/2015   Esophageal reflux 01/24/2015   Preoperative cardiovascular examination 06/28/2014   DM type 2 causing vascular disease ([place])    Mixed hyperlipidemia    Obesity    Intermediate coronary syndrome ([place]) 12/09/2013   Lumbago 05/21/2012   Essential hypertension, benign 12/28/2009   CAD S/P LAD DES March 2015 12/28/2009   Hyperlipidemia LDL goal <70 11/25/2009   Accelerating angina ([place]) 11/25/2009   DM 11/24/2009   ABDOMINAL PAIN, HX OF 11/24/2009    Immunization History  Administered Date(s) Administered   Influenza,inj,Quad PF,6+ Mos 07/11/2016, 08/02/2017, 08/05/2018, 08/10/2019, 08/08/2020, 06/21/2021   Influenza-Unspecified 06/18/2013, 06/17/2014, 07/06/2015   Td 01/28/2014    Conditions to be addressed/monitored: CAD, HTN, HLD, and DMII  Care Plan : Medication Management  Updates made by Beryle Lathe, [place] since 08/25/2021 12:00 AM     Problem: HTN, CAD/HLD, T2DM   Priority: High  Onset Date: 05/26/2021     Long-Range Goal: Disease Progression Prevention   Start Date: 05/26/2021  Expected End Date: 08/24/2021  Recent Progress: On track  Priority: High  Note:   Current Barriers:  Unable to achieve control of hyperlipidemia  Pharmacist Clinical Goal(s):  Over the next 90 days, patient will Achieve control of hyperlipidemia as evidenced by improved LDL and improved triglycerides through collaboration with PharmD and provider.   Interventions: 1:1 collaboration with [name], MD regarding development and update of comprehensive plan of care as evidenced by provider attestation and co-signature Inter-disciplinary care team collaboration (see longitudinal plan of  care) Comprehensive medication review performed; medication list updated in electronic medical record  Type 2 Diabetes (Followed by Dr. Garnet Koyanagi @ [facility]): Controlled; Most recent A1c at goal of <7% per ADA guidelines Current medications: metformin 1,000 mg by mouth twice daily with meals, exenatide ER (Bydureon) 2 mg subcutaneously weekly on Monday, dapagliflozin (Farxiga) 5 mg by mouth every morning, and insulin glargine (Toujeo) 40-50 units subcutaneously once daily and insulin lispro (Humalog) 30-35 units subcutaneously 5-15 minutes before each meal Intolerances: none Taking medications as directed: yes Side effects thought to be attributed to current medication regimen: no Hypoglycemia prevention: none but will recommend Current meal patterns: not discussed today Current exercise: not discussed today On a statin: yes On aspirin 81 mg daily: yes Last microalbumin: 15.7 (2020); on an ACEi/ARB: yes Last eye exam: overdue Last foot exam: overdue Current glucose readings:  not discussed today but will discuss at future visits Instructed to monitor blood sugars continuously with continuous glucose monitor  Encouraged regular aerobic exercise with a goal of 30 minutes five times per week (150 minutes per week) Continue current management per endocrinology as above. Continue to monitor for signs or symptoms of hypoglycemia Consider prescribing glucagon in case of emergency hypoglycemia   Hypertension: Blood pressure under good control. Blood pressure is at goal of <130/80 mmHg per 2017 AHA/ACC guidelines. Current medications: losartan 25 mg by mouth once daily and propranolol 20 mg by mouth twice daily Recently switched from bisoprolol to propranolol due to tremor Intolerances: none Taking medications as directed: yes Side effects thought to be attributed to current medication regimen: no Current home blood pressure: not discussed today but will discuss at future  visits Continue losartan 25 mg by mouth once daily and propranolol 20 mg by mouth twice daily Encourage dietary sodium restriction/DASH diet Recommend regular aerobic exercise Recommend home blood pressure monitoring to discuss at next visit  Coronary artery disease s/p DES 2015/Hyperlipidemia/Angina (Followed by Dr. [name]): Uncontrolled.  LDL 71 which is near goal of <70 due to very high risk given 10-year risk >20% per 2020 AACE/ACE guidelines  Triglycerides well above goal of <150 per 2020 AACE/ACE guidelines Does not smoke currently. Only smoked a little bit when he was a teenager. Wife does currently smoke Current medications: atorvastatin 40 mg by mouth once daily, aspirin 81 mg by mouth daily, losartan 25 mg by mouth daily, propranolol 20 mg by mouth twice daily, ranolazine 1,000 mg by mouth twice daily, fish oil 2 capsules twice daily, and nitroglycerin as needed for chest pain Intolerances: none Taking medications as directed: yes Side effects thought to be attributed to current medication regimen: no Continue statin, aspirin, beta blocker, angiotensin receptor blocker, ranolazine, fish oil, and nitroglycerin as above Recommend regular aerobic exercise Would prefer for patient to have prescription strength fish oil such as Vascepa (icosapent ethyl) or Lovaza (omega-3 ethyl esters); however, these agent are $100 per month on the patient's insurance. PAN foundation for hypercholesterolemia fund currently closed In order to achieve similar dose of EPA to Vascepa (2g twice daily),  patient would need to take 4-8 capsules twice daily of an over the counter fish oil depending on the brand. Patient is currently taking over the counter fish oil 2 capsules twice daily. Will plan to re-check lipid panel in 4-12 weeks to determine if further increase is necessary to decrease triglycerides to goal.  Patient Goals/Self-Care Activities Over the next 90 days, patient will:  Take medications as  prescribed Check blood sugar continuously with continuous glucose monitor, document, and provide at future appointments Check blood pressure at least once daily, document, and provide at future appointments Engage in dietary modifications by fewer sweetened foods & beverages  Follow Up Plan: Telephone follow up appointment with care management team member scheduled for: 10/27/21      Medication Assistance: None required.  Patient affirms current coverage meets needs.  Patient's preferred pharmacy is:  [place], [address] [place] [address] Phone: [phone] Fax: [phone]  Follow Up:  Patient agrees to Care Plan and Follow-up.  Plan: Telephone follow up appointment with care management team member scheduled for:  10/27/21  Kennon Holter, PharmD, BCACP, CPP Clinical Pharmacist Practitioner [place] [phone]

## 2021-08-29 DIAGNOSIS — M47812 Spondylosis without myelopathy or radiculopathy, cervical region: Secondary | ICD-10-CM | POA: Diagnosis not present

## 2021-08-29 DIAGNOSIS — M9901 Segmental and somatic dysfunction of cervical region: Secondary | ICD-10-CM | POA: Diagnosis not present

## 2021-08-29 DIAGNOSIS — M546 Pain in thoracic spine: Secondary | ICD-10-CM | POA: Diagnosis not present

## 2021-08-29 DIAGNOSIS — G43001 Migraine without aura, not intractable, with status migrainosus: Secondary | ICD-10-CM | POA: Diagnosis not present

## 2021-08-31 ENCOUNTER — Other Ambulatory Visit: Payer: Self-pay

## 2021-08-31 ENCOUNTER — Inpatient Hospital Stay (HOSPITAL_COMMUNITY): Payer: Medicare Other

## 2021-08-31 VITALS — BP 111/78 | HR 84 | Temp 98.0°F | Resp 20

## 2021-08-31 DIAGNOSIS — D509 Iron deficiency anemia, unspecified: Secondary | ICD-10-CM | POA: Diagnosis not present

## 2021-08-31 MED ORDER — SODIUM CHLORIDE 0.9 % IV SOLN
Freq: Once | INTRAVENOUS | Status: AC
Start: 2021-08-31 — End: 2021-08-31

## 2021-08-31 MED ORDER — LORATADINE 10 MG PO TABS
10.0000 mg | ORAL_TABLET | Freq: Once | ORAL | Status: AC
  Administered 2021-08-31: 10 mg via ORAL
  Filled 2021-08-31: qty 1

## 2021-08-31 MED ORDER — SODIUM CHLORIDE 0.9% FLUSH: 3.0000 mL | Freq: Once | INTRAVENOUS | Status: DC | PRN

## 2021-08-31 MED ORDER — SODIUM CHLORIDE 0.9% FLUSH: 10.0000 mL | Freq: Once | INTRAVENOUS | Status: DC | PRN

## 2021-08-31 MED ORDER — HEPARIN SOD (PORK) LOCK FLUSH 100 UNIT/ML IV SOLN: 500.0000 [IU] | Freq: Once | INTRAVENOUS | Status: DC | PRN

## 2021-08-31 MED ORDER — SODIUM CHLORIDE 0.9 % IV SOLN
510.0000 mg | Freq: Once | INTRAVENOUS | Status: AC
  Administered 2021-08-31: 510 mg via INTRAVENOUS
  Filled 2021-08-31: qty 510

## 2021-08-31 MED ORDER — ALTEPLASE 2 MG IJ SOLR: 2.0000 mg | Freq: Once | INTRAMUSCULAR | Status: DC | PRN

## 2021-08-31 MED ORDER — HEPARIN SOD (PORK) LOCK FLUSH 100 UNIT/ML IV SOLN: 250.0000 [IU] | Freq: Once | INTRAVENOUS | Status: DC | PRN

## 2021-08-31 MED ORDER — ACETAMINOPHEN 325 MG PO TABS
650.0000 mg | ORAL_TABLET | Freq: Once | ORAL | Status: AC
  Administered 2021-08-31: 650 mg via ORAL
  Filled 2021-08-31: qty 2

## 2021-08-31 NOTE — Patient Instructions (Signed)
Jared Griffin  Discharge Instructions: Thank you for choosing [facility] to provide your oncology and hematology care.  If you have a lab appointment with the [facility], please come in thru the Main Entrance and check in at the main information desk.  Wear comfortable clothing and clothing appropriate for easy access to any Portacath or PICC line.   We strive to give you quality time with your provider. You may need to reschedule your appointment if you arrive late (15 or more minutes).  Arriving late affects you and other patients whose appointments are after yours.  Also, if you miss three or more appointments without notifying the office, you may be dismissed from the clinic at the providers discretion.      For prescription refill requests, have your pharmacy contact our office and allow 72 hours for refills to be completed.    Today you received the following chemotherapy and/or immunotherapy agents Feraheme      To help prevent nausea and vomiting after your treatment, we encourage you to take your nausea medication as directed.  BELOW ARE SYMPTOMS THAT SHOULD BE REPORTED IMMEDIATELY: *FEVER GREATER THAN 100.4 F (38 C) OR HIGHER *CHILLS OR SWEATING *NAUSEA AND VOMITING THAT IS NOT CONTROLLED WITH YOUR NAUSEA MEDICATION *UNUSUAL SHORTNESS OF BREATH *UNUSUAL BRUISING OR BLEEDING *URINARY PROBLEMS (pain or burning when urinating, or frequent urination) *BOWEL PROBLEMS (unusual diarrhea, constipation, pain near the anus) TENDERNESS IN MOUTH AND THROAT WITH OR WITHOUT PRESENCE OF ULCERS (sore throat, sores in mouth, or a toothache) UNUSUAL RASH, SWELLING OR PAIN  UNUSUAL VAGINAL DISCHARGE OR ITCHING   Items with * indicate a potential emergency and should be followed up as soon as possible or go to the Emergency Department if any problems should occur.  Please show the CHEMOTHERAPY ALERT CARD or IMMUNOTHERAPY ALERT CARD at check-in to the Emergency  Department and triage nurse.  Should you have questions after your visit or need to cancel or reschedule your appointment, please contact [facility] [phone]  and follow the prompts.  Office hours are 8:00 a.m. to 4:30 p.m. Monday - Friday. Please note that voicemails left after 4:00 p.m. may not be returned until the following business day.  We are closed weekends and major holidays. You have access to a nurse at all times for urgent questions. Please call the main number to the clinic [phone] and follow the prompts.  For any non-urgent questions, you may also contact your provider using MyChart. We now offer e-Visits for anyone 42 and older to request care online for non-urgent symptoms. For details visit [email or web address].   Also download the MyChart app! Go to the app store, search "MyChart", open the app, select [facility], and log in with your MyChart username and password.  Due to Covid, a mask is required upon entering the hospital/clinic. If you do not have a mask, one will be given to you upon arrival. For doctor visits, patients may have 1 support person aged 76 or older with them. For treatment visits, patients cannot have anyone with them due to current Covid guidelines and our immunocompromised population.

## 2021-08-31 NOTE — Progress Notes (Signed)
Patient presents today for Feramheme infusion per providers order.  Vital signs WNL.  Patient has no new complaints at this time.  Peripheral IV started and blood return noted pre and post infusion.  Feraheme infusion given today per MD orders.  Stable during infusion without adverse affects.  Vital signs stable.  No complaints at this time.  Discharge from clinic ambulatory in stable condition.  Alert and oriented X 3.  Follow up with [facility] as scheduled.

## 2021-09-01 DIAGNOSIS — G43001 Migraine without aura, not intractable, with status migrainosus: Secondary | ICD-10-CM | POA: Diagnosis not present

## 2021-09-01 DIAGNOSIS — M546 Pain in thoracic spine: Secondary | ICD-10-CM | POA: Diagnosis not present

## 2021-09-01 DIAGNOSIS — M47812 Spondylosis without myelopathy or radiculopathy, cervical region: Secondary | ICD-10-CM | POA: Diagnosis not present

## 2021-09-01 DIAGNOSIS — M9901 Segmental and somatic dysfunction of cervical region: Secondary | ICD-10-CM | POA: Diagnosis not present

## 2021-09-04 ENCOUNTER — Telehealth: Payer: Self-pay | Admitting: Family Medicine

## 2021-09-04 DIAGNOSIS — Z79899 Other long term (current) drug therapy: Secondary | ICD-10-CM

## 2021-09-04 DIAGNOSIS — I10 Essential (primary) hypertension: Secondary | ICD-10-CM

## 2021-09-04 DIAGNOSIS — E785 Hyperlipidemia, unspecified: Secondary | ICD-10-CM

## 2021-09-04 DIAGNOSIS — E1159 Type 2 diabetes mellitus with other circulatory complications: Secondary | ICD-10-CM

## 2021-09-04 NOTE — Telephone Encounter (Signed)
Pt has appt in April for wellness. Does pt need lab work? Pt had CBC, Iron/TIBC, Ferritin checked through [facility]. Last labs from [place] 05/25/21 Lip, hepatic, met 7, psa, iron, ferritin, hep b and hep c. Please advise. Thank you

## 2021-09-05 ENCOUNTER — Encounter (HOSPITAL_COMMUNITY): Payer: Self-pay | Admitting: Psychiatry

## 2021-09-05 ENCOUNTER — Telehealth (INDEPENDENT_AMBULATORY_CARE_PROVIDER_SITE_OTHER): Payer: Medicare Other | Admitting: Psychiatry

## 2021-09-05 ENCOUNTER — Other Ambulatory Visit: Payer: Self-pay

## 2021-09-05 DIAGNOSIS — F321 Major depressive disorder, single episode, moderate: Secondary | ICD-10-CM

## 2021-09-05 MED ORDER — SERTRALINE HCL 100 MG PO TABS: 150.0000 mg | ORAL_TABLET | Freq: Every day | ORAL | 2 refills | Status: DC

## 2021-09-05 MED ORDER — ALPRAZOLAM 1 MG PO TABS: 1.0000 mg | ORAL_TABLET | Freq: Three times a day (TID) | ORAL | 2 refills | Status: DC | PRN

## 2021-09-05 NOTE — Telephone Encounter (Signed)
Lipid, liver, metabolic 7, B4Y, urine ACR Wellness, hyperlipidemia, diabetes, hypertension It would be advisable to do this blood work in March with follow-up wellness in April

## 2021-09-05 NOTE — Progress Notes (Signed)
Virtual Visit via Telephone Note  I connected with Jared Griffin on 09/05/21 at  1:20 PM EST by telephone and verified that I am speaking with the correct person using two identifiers.  Location: Patient: home Provider: office   I discussed the limitations, risks, security and privacy concerns of performing an evaluation and management service by telephone and the availability of in person appointments. I also discussed with the patient that there may be a patient responsible charge related to this service. The patient expressed understanding and agreed to proceed.       I discussed the assessment and treatment plan with the patient. The patient was provided an opportunity to ask questions and all were answered. The patient agreed with the plan and demonstrated an understanding of the instructions.   The patient was advised to call back or seek an in-person evaluation if the symptoms worsen or if the condition fails to improve as anticipated.  I provided 12 minutes of non-face-to-face time during this encounter.   [name], MD  [facility] MD/PA/NP OP Progress Note  09/05/2021 1:51 PM Jared Griffin  MRN:  [id number]  Chief Complaint:  Chief Complaint   Depression; Anxiety; Follow-up    HPI: This patient is a 60 year old married white male lives with his wife and  son in [place]. 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  Recently he has been working at [employer] zone but had an arm injury at work and had to quit there as well  The patient returns for follow-up after 3 months.  He states in terms of his mood he has been doing okay.  He has had some ongoing medical issues including irritable bowel syndrome.  None of the medications for this have helped.  He is trying to eliminate various foods such as dairy and he thinks this is beginning to help.  He also has iron deficiency anemia and is gotten iron  infusions.  He states that he is not significantly depressed.  He denies any suicidal ideation.  He is sleeping well at night and rarely needs the trazodone.  Xanax continues to help his anxiety. Visit Diagnosis:    ICD-10-CM   1. Moderate single current episode of major depressive disorder (HCC)  F32.1       Past Psychiatric History: none  Past Medical History:  Past Medical History:  Diagnosis Date   Anxiety    COPD (chronic obstructive pulmonary disease) ([place])    Coronary atherosclerosis of native coronary artery    a. 12/09/2013: s/p PCI with 3.0 x 28 Promus DES to mLAD   Depression    Diabetic neuropathy ([place])    Elbow injury 07/25/2017   Surgery for torn tendon   Essential hypertension    Fatty liver disease, nonalcoholic    GERD (gastroesophageal reflux disease)    Iron deficiency anemia 08/15/2021   Lateral epicondylitis of right elbow    Mixed hyperlipidemia    Obesity    Osgood-Schlatter's disease of right knee    Skin cyst 10/03/2017   Removed from back   Type 2 diabetes mellitus ([place])     Past Surgical History:  Procedure Laterality Date   BIOPSY  01/21/2019   Procedure: BIOPSY;  Surgeon: [name], MD;  [facility];  Service: Endoscopy;;  gastric bx and gastric polyps   BIOPSY  11/08/2020   Procedure: BIOPSY;  Surgeon: [name], MD;  [facility];  Service: Gastroenterology;;   CARDIAC CATHETERIZATION  2011   CARDIAC CATHETERIZATION N/A 09/20/2015   Procedure: Left Heart Cath and Coronary Angiography;  Surgeon: [name], MD;  Location: [facility] CV LAB;  Service: Cardiovascular;  Laterality: N/A;   CHOLECYSTECTOMY  2003   COLONOSCOPY  06/19/2012   Procedure: COLONOSCOPY;  Surgeon: [name], MD;  [facility];  Service: Endoscopy;  Laterality: N/A;  930   COLONOSCOPY N/A 10/17/2017   Procedure: COLONOSCOPY;  Surgeon: [name], MD;  [facility];  Service: Endoscopy;   Laterality: N/A;  1225   COLONOSCOPY WITH PROPOFOL N/A 11/08/2020   Procedure: COLONOSCOPY WITH PROPOFOL;  Surgeon: [name], MD;  [facility];  Service: Gastroenterology;  Laterality: N/A;  10:45   COLONOSCOPY WITH PROPOFOL N/A 04/04/2021   Procedure: COLONOSCOPY WITH PROPOFOL;  Surgeon: [name], MD;  [facility];  Service: Gastroenterology;  Laterality: N/A;   CYST EXCISION  07/2019   ESOPHAGOGASTRODUODENOSCOPY (EGD) WITH PROPOFOL N/A 01/21/2019   Procedure: ESOPHAGOGASTRODUODENOSCOPY (EGD) WITH PROPOFOL;  Surgeon: [name], MD;  [facility];  Service: Endoscopy;  Laterality: N/A;  10:15am   ESOPHAGOGASTRODUODENOSCOPY (EGD) WITH PROPOFOL N/A 02/17/2021   Procedure: ESOPHAGOGASTRODUODENOSCOPY (EGD) WITH PROPOFOL;  Surgeon: [name], MD;  [facility];  Service: Gastroenterology;  Laterality: N/A;  10:20   ESOPHAGOGASTRODUODENOSCOPY (EGD) WITH PROPOFOL N/A 04/04/2021   Procedure: ESOPHAGOGASTRODUODENOSCOPY (EGD) WITH PROPOFOL;  Surgeon: [name], MD;  [facility];  Service: Gastroenterology;  Laterality: N/A;  12:30   KNEE ARTHROPLASTY Right 1999   LEFT HEART CATH AND CORONARY ANGIOGRAPHY N/A 02/26/2018   Procedure: LEFT HEART CATH AND CORONARY ANGIOGRAPHY;  Surgeon: [name], MD;  Location: [facility] CV LAB;  Service: Cardiovascular;  Laterality: N/A;   LEFT HEART CATHETERIZATION WITH CORONARY ANGIOGRAM N/A 12/09/2013   Procedure: LEFT HEART CATHETERIZATION WITH CORONARY ANGIOGRAM;  Surgeon: [name], MD;  Location: [facility] CATH LAB;  Service: Cardiovascular;  Laterality: N/A;   Liver biopsy     POLYPECTOMY  10/17/2017   Procedure: POLYPECTOMY;  Surgeon: [name], MD;  [facility];  Service: Endoscopy;;  ascending colon, splenic flexure,sigmoid x2   POLYPECTOMY  11/08/2020   Procedure: POLYPECTOMY;  Surgeon: [name], MD;  [facility];  Service: Gastroenterology;;   POLYPECTOMY  04/04/2021   Procedure: POLYPECTOMY;  Surgeon: [name], MD;  [facility];  Service: Gastroenterology;;   TENNIS ELBOW RELEASE/NIRSCHEL PROCEDURE Right 07/25/2017   Procedure: TENNIS ELBOW RELEASE and debriedment;  Surgeon: [name], MD;  Location: [facility] OR[facility];  Service: Orthopedics;  Laterality: Right;   TONSILLECTOMY  1970's    Family Psychiatric History: see below  Family History:  Family History  Problem Relation Age of Onset   Osteoporosis Mother    Cancer Maternal Aunt    CVA Maternal Grandmother    CVA Maternal Grandfather    Healthy Son     Social History:  Social History   Socioeconomic History   Marital status: Married    Spouse name: Jared Griffin   Number of children: 1   Years of education: Not on file   Highest education level: Not on file  Occupational History   Not on file  Tobacco Use   Smoking status: Former    Packs/day: 0.50    Years: 10.00    Pack years: 5.00  Types: Cigarettes    Start date: 02/02/1973    Quit date: 09/17/1983    Years since quitting: 37.9   Smokeless tobacco: Former    Types: Chew    Quit date: 09/17/1983  Vaping Use   Vaping Use: Never used  Substance and Sexual Activity   Alcohol use: No    Alcohol/week: 0.0 standard drinks   Drug use: No   Sexual activity: Yes  Other Topics Concern   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    Married with 1 child-son   Right handed    12 th    Very little caffeine   Social Determinants of Radio broadcast assistant Strain: Low Risk    Difficulty of Paying Living Expenses: Not hard at all  Food Insecurity: No Food Insecurity   Worried About Charity fundraiser in the Last Year: Never true   Arboriculturist in the Last Year: Never true  Transportation Needs: No Transportation Needs    Lack of Transportation (Medical): No   Lack of Transportation (Non-Medical): No  Physical Activity: Insufficiently Active   Days of Exercise per Week: 3 days   Minutes of Exercise per Session: 30 min  Stress: No Stress Concern Present   Feeling of Stress : Not at all  Social Connections: Socially Integrated   Frequency of Communication with Friends and Family: More than three times a week   Frequency of Social Gatherings with Friends and Family: More than three times a week   Attends Religious Services: More than 4 times per year   Active Member of [employer] or Organizations: Yes   Attends Archivist Meetings: More than 4 times per year   Marital Status: Married    Allergies:  Allergies  Allergen Reactions   Doxycycline     Severe esophagitis    Metabolic Disorder Labs: Lab Results  Component Value Date   HGBA1C 6.6 10/18/2020   MPG 211.6 07/12/2018   MPG 197.25 07/23/2017   No results found for: PROLACTIN Lab Results  Component Value Date   CHOL 172 05/25/2021   TRIG 455 (H) 05/25/2021   HDL 29 (L) 05/25/2021   CHOLHDL 5.9 (H) 05/25/2021   VLDL UNABLE TO CALCULATE IF TRIGLYCERIDE OVER 400 mg/dL 07/12/2018   LDLCALC 71 05/25/2021   LDLCALC 51 10/18/2018   Lab Results  Component Value Date   TSH 0.648 08/20/2019   TSH 0.65 08/20/2019    Therapeutic Level Labs: No results found for: LITHIUM No results found for: VALPROATE No components found for:  CBMZ  Current Medications: Current Outpatient Medications  Medication Sig Dispense Refill   acetaminophen (TYLENOL) 500 MG tablet Take 1,000 mg by mouth every 8 (eight) hours as needed for moderate pain.     albuterol (VENTOLIN HFA) 108 (90 Base) MCG/ACT inhaler INHALE 2 PUFFS INTO LUNGS EVERY 6 HOURS AS NEEDED FOR WHEEZING AND SHORTNESS OF BREATH. (Patient taking differently: Inhale 2 puffs into the lungs every 6 (six) hours as needed for shortness of breath or wheezing. INHALE 2 PUFFS INTO LUNGS EVERY 6 HOURS AS  NEEDED FOR WHEEZING AND SHORTNESS OF BREATH.) 8.5 g 5   ALPRAZolam (XANAX) 1 MG tablet Take 1 tablet (1 mg total) by mouth 3 (three) times daily as needed for anxiety. 90 tablet 2   amoxicillin-clavulanate (AUGMENTIN) 500-125 MG tablet Take 1 tablet by mouth 3 (three) times daily.  aspirin 81 MG EC tablet Take 1 tablet (81 mg total) by mouth daily. 30 tablet 12   atorvastatin (LIPITOR) 40 MG tablet TAKE ONE TABLET (40MG TOTAL) BY MOUTH DAILY 90 tablet 1   B-D UF III MINI PEN NEEDLES 31G X 5 MM MISC USE UP TO 4 TIMES DAILY WITH INSULIN AS DIRECTED. 100 each 3   cetirizine (ZYRTEC) 10 MG tablet Take 1 tablet (10 mg total) by mouth daily. 30 tablet 0   Continuous Blood Gluc Sensor (FREESTYLE LIBRE 14 DAY SENSOR) MISC Inject 1 each into the skin every 14 (fourteen) days. Use as directed. 2 each 2   dapagliflozin propanediol (FARXIGA) 5 MG TABS tablet Take 5 mg by mouth daily.     dicyclomine (BENTYL) 10 MG capsule Take 1 capsule (10 mg total) by mouth 3 (three) times daily before meals. 90 capsule 3   Exenatide ER (BYDUREON BCISE) 2 MG/0.85ML AUIJ Inject 2 mg into the skin every Monday.     fluticasone (FLONASE) 50 MCG/ACT nasal spray Place 2 sprays into both nostrils daily. (Patient taking differently: Place 2 sprays into both nostrils daily as needed for allergies.) 16 g 0   fluticasone-salmeterol (ADVAIR HFA) 115-21 MCG/ACT inhaler Inhale 2 puffs into the lungs 2 (two) times daily. 1 Inhaler 5   gabapentin (NEURONTIN) 300 MG capsule Take 2 qam and 2 in the evening. 120 capsule 5   HUMALOG KWIKPEN 200 UNIT/ML KwikPen Inject 30-35 Units into the skin 3 (three) times daily.     hyoscyamine (LEVSIN SL) 0.125 MG SL tablet Place 1 tablet (0.125 mg total) under the tongue every 4 (four) hours as needed. 90 tablet 3   ipratropium (ATROVENT) 0.06 % nasal spray Place 2 sprays into both nostrils in the morning and at bedtime.     Lancets MISC 1 each by Does not apply route 4 (four) times daily. 150 each 5    losartan (COZAAR) 25 MG tablet TAKE (1) TABLET BY MOUTH ONCE DAILY. (Patient taking differently: Take 25 mg by mouth daily.) 90 tablet 3   metFORMIN (GLUCOPHAGE) 500 MG tablet TAKE 2 TABLETS BY MOUTH TWICE DAILY WITH A MEAL. (Patient taking differently: Take 1,000 mg by mouth 2 (two) times daily with a meal.) 120 tablet 0   methocarbamol (ROBAXIN) 500 MG tablet Take 500 mg by mouth every 8 (eight) hours as needed for muscle spasms.     nitroGLYCERIN (NITROSTAT) 0.4 MG SL tablet Place 1 tablet (0.4 mg total) under the tongue every 5 (five) minutes as needed. 25 tablet 3   Omega-3 Fatty Acids (FISH OIL) 1200 MG CAPS Take 2 capsules (2,400 mg total) by mouth 2 (two) times daily.     omeprazole (PRILOSEC) 40 MG capsule Take 1 capsule (40 mg total) by mouth daily. 90 capsule 3   ondansetron (ZOFRAN) 4 MG tablet Take 4 mg by mouth every 8 (eight) hours as needed for nausea or vomiting.     propranolol (INDERAL) 20 MG tablet Take 1 tablet (20 mg total) by mouth 2 (two) times daily. 180 tablet 0   ranolazine (RANEXA) 1000 MG SR tablet TAKE ONE TABLET (1000MG TOTAL) BY MOUTH TWO TIMES DAILY 180 tablet 1   sertraline (ZOLOFT) 100 MG tablet Take 1.5 tablets (150 mg total) by mouth daily. 135 tablet 2   sucralfate (CARAFATE) 1 GM/10ML suspension Take 10 mLs (1 g total) by mouth 4 (four) times daily. 420 mL 0   TOUJEO MAX SOLOSTAR 300 UNIT/ML Solostar Pen INJECT 130 UNITS INTO  THE SKIN AT BEDTIME. (Patient taking differently: Inject 40-50 Units into the skin at bedtime.) 12 mL 0   traZODone (DESYREL) 50 MG tablet Take 1 tablet (50 mg total) by mouth at bedtime as needed. 30 tablet 2   No current facility-administered medications for this visit.     Musculoskeletal: Strength & Muscle Tone: na Gait & Station: na Patient leans: N/A  Psychiatric Specialty Exam: Review of Systems  Constitutional:  Positive for fatigue.  Gastrointestinal:  Positive for abdominal pain, constipation and diarrhea.  All other  systems reviewed and are negative.  There were no vitals taken for this visit.There is no height or weight on file to calculate BMI.  General Appearance: NA  Eye Contact:  NA  Speech:  Clear and Coherent  Volume:  Normal  Mood:  Euthymic  Affect:  NA  Thought Process:  Goal Directed  Orientation:  Full (Time, Place, and Person)  Thought Content: WDL   Suicidal Thoughts:  No  Homicidal Thoughts:  No  Memory:  Immediate;   Good Recent;   Good Remote;   Good  Judgement:  Good  Insight:  Good  Psychomotor Activity:  Decreased  Concentration:  Concentration: Good and Attention Span: Good  Recall:  Good  Fund of Knowledge: Good  Language: Good  Akathisia:  No  Handed:  Right  AIMS (if indicated): not done  Assets:  Communication Skills Desire for Improvement Resilience Social Support Talents/Skills  ADL's:  Intact  Cognition: WNL  Sleep:  Good   Screenings: GAD-7    Flowsheet Row Office Visit from 08/08/2020 in [facility] Visit from 08/05/2018 in [facility]  Total GAD-7 Score 3 5      PHQ2-9    Flowsheet Row Video Visit from 09/05/2021 in [facility] from 08/08/2021 in [facility] Visit from 06/21/2021 in [facility] Video Visit from 04/28/2021 in [facility] Video Visit from 01/26/2021 in [facility]  PHQ-2 Total Score 0 0 0 0 1  PHQ-9 Total Score -- 0 0 -- --      Flowsheet Row Video Visit from 09/05/2021 in [facility] Video Visit from 04/28/2021 in [facility] (Discharged) from 04/04/2021 in [facility] No Risk No Risk No Risk        Assessment and Plan: This patient is a 60 year old male with a history of depression  anxiety and chronic pain.  He seems to be doing fairly well and feels that his medications have been helpful.  He will continue Xanax 1 mg up to 3 times daily as needed for anxiety, Zoloft 150 mg daily for depression and occasionally trazodone 50 mg at bedtime as needed for sleep.  He will return to see me in 3 months   [name], MD 09/05/2021, 1:51 PM

## 2021-09-06 NOTE — Addendum Note (Signed)
Addended by: Dairl Ponder on: 09/06/2021 09:33 AM   Modules accepted: Orders

## 2021-09-06 NOTE — Telephone Encounter (Signed)
Blood work ordered in [facility]. Patient notified.

## 2021-09-07 ENCOUNTER — Other Ambulatory Visit: Payer: Self-pay

## 2021-09-07 ENCOUNTER — Encounter (INDEPENDENT_AMBULATORY_CARE_PROVIDER_SITE_OTHER): Payer: Self-pay | Admitting: Gastroenterology

## 2021-09-07 ENCOUNTER — Encounter (INDEPENDENT_AMBULATORY_CARE_PROVIDER_SITE_OTHER): Payer: Self-pay

## 2021-09-07 ENCOUNTER — Ambulatory Visit (INDEPENDENT_AMBULATORY_CARE_PROVIDER_SITE_OTHER): Payer: Medicare Other | Admitting: Gastroenterology

## 2021-09-07 ENCOUNTER — Other Ambulatory Visit (INDEPENDENT_AMBULATORY_CARE_PROVIDER_SITE_OTHER): Payer: Self-pay

## 2021-09-07 VITALS — BP 135/84 | HR 103 | Temp 98.5°F | Ht 74.0 in | Wt 260.9 lb

## 2021-09-07 DIAGNOSIS — R112 Nausea with vomiting, unspecified: Secondary | ICD-10-CM

## 2021-09-07 DIAGNOSIS — K580 Irritable bowel syndrome with diarrhea: Secondary | ICD-10-CM

## 2021-09-07 DIAGNOSIS — K7581 Nonalcoholic steatohepatitis (NASH): Secondary | ICD-10-CM

## 2021-09-07 DIAGNOSIS — K529 Noninfective gastroenteritis and colitis, unspecified: Secondary | ICD-10-CM | POA: Diagnosis not present

## 2021-09-07 MED ORDER — DOXYLAMINE-PYRIDOXINE 10-10 MG PO TBEC: 1.0000 | DELAYED_RELEASE_TABLET | Freq: Two times a day (BID) | ORAL | 3 refills | Status: DC | PRN

## 2021-09-07 MED ORDER — COLESTIPOL HCL 1 G PO TABS: 2.0000 g | ORAL_TABLET | Freq: Every day | ORAL | 3 refills | Status: DC

## 2021-09-07 NOTE — Patient Instructions (Addendum)
Stop all milk based products or if taking any, you can try over the counter lactase enzymes Schedule capsule endoscopy Start on colestipol 2 g qday Continue Imodium as needed for diarrhea Stop Bentyl (dicyclomine) and hyoscyamine (Levsin) Stop Zofran Can try doxylamine-pyridoxine for nausea

## 2021-09-07 NOTE — Progress Notes (Signed)
[name], M.D. Gastroenterology & Hepatology [facility] [address] [place], [place] [address]  Primary Care Physician: [name], MD [place] [id number]  I will communicate my assessment and recommendations to the referring MD via EMR.  Problems: NASH IBS-D History of E. Coli O157 - resolved Possible lactose intolerance Chronic diarrhea  History of Present Illness: Jared Griffin is a 60 y.o. male with past medical history COPD, CAD s/p stent, diabetes c/b neuropathy , HLD, NASH and IBS-D, who presents for follow up of diarrhea and abdominal pain.  The patient was last seen on 11/17/2020. At that time, the patient was given lifestyle modifications with a low FODMAP and Mediterranean diet, as well as to implement the use of IBgard.  Since then, he has presented the complicated course in which he has developed recurrent episodes of diarrhea of unclear etiology for which he has undergone colonoscopies but they have not show any active inflammation.  He tried Xifaxan in the past without any improvement.  He was then given a prescription for Bentyl for abdominal pain without relief.  He was also found to have E. coli 0157 in stool for which he received conservative treatment.  Notably, he has had negative serology for celiac disease, as well as stool testing that was negative for 5 polyps portion or decreased fecal elastase.  States he is now feeling well and reports having persistent diarrhea.  Reports that he is moving his bowels 7-8 times a day. He reports he is having watery bowel movements every time, usually after eating. He wakes up during the night to have a bowel movement. He has a frequent burning sensation in the upper abdominal area. He cut down 95% of the milk for the last week - states with this he is having 4 Bms per day, still watery. Still having the abdominal pain in the upper  abdomen.   States he takes Imodium up to 2 tablets per day, sometimes it helps but does not work always.  His appetite has changed and has lost close to 6 lb - this was 25 years ago.  He had his gallbladder removed in the past.  Has been taking Bentyl but does not feel it helps.  The patient denies having any nausea, vomiting, fever, chills, hematochezia, melena, hematemesis, abdominal distention, abdominal pain, diarrhea, jaundice, pruritus or weight loss.  Notably, as part of his work-up of episodes of chronic diarrhea, he had negative celiac serologies on August 2022, also had normal pancreatic elastase and stool, normal fecal fat, ova and parasite were negative at that time.  Last EGD: 04/04/2021, normal esophagus with healed pill esophageal ulcer, multiple gastric polyps, normal duodenum. Last Colonoscopies:  11/08/2020 which showed a total of 5 subcentimeter polyps in the colon, 15 mm polyp in the mid transverse colon which was resected in a piecemeal fashion, a 20 mm lipoma hemorrhoids.  Random colonic biopsies were negative for microscopic colitis.  All the polyps were tubular adenomas.  Repeat colonoscopy in 04/04/2021  - Preparation of the colon was inadequate. - Hemorrhoids found on perianal exam. - The examined portion of the ileum was normal. - One 3 mm polyp at the ileocecal valve, removed with a cold snare. Complete resection. Polyp tissue not retrieved. - Medium-sized lipoma in the transverse colon. - The exam was otherwise normal throughout the examined colon. No presence of inflammation in the colon throughout. - Stool in the entire examined colon. -  Non-bleeding external internal hemorrhoids.  Recommended repeat colonoscopy in 3 years - Needs 2 day prep  Past Medical History: Past Medical History:  Diagnosis Date   Anxiety    COPD (chronic obstructive pulmonary disease) ([place])    Coronary atherosclerosis of native coronary artery    a. 12/09/2013: s/p PCI with 3.0 x  28 Promus DES to mLAD   Depression    Diabetic neuropathy ([place])    Elbow injury 07/25/2017   Surgery for torn tendon   Essential hypertension    Fatty liver disease, nonalcoholic    GERD (gastroesophageal reflux disease)    Iron deficiency anemia 08/15/2021   Lateral epicondylitis of right elbow    Mixed hyperlipidemia    Obesity    Osgood-Schlatter's disease of right knee    Skin cyst 10/03/2017   Removed from back   Type 2 diabetes mellitus ([place])     Past Surgical History: Past Surgical History:  Procedure Laterality Date   BIOPSY  01/21/2019   Procedure: BIOPSY;  Surgeon: [name], MD;  [facility];  Service: Endoscopy;;  gastric bx and gastric polyps   BIOPSY  11/08/2020   Procedure: BIOPSY;  Surgeon: [name], MD;  [facility];  Service: Gastroenterology;;   CARDIAC CATHETERIZATION  2011   CARDIAC CATHETERIZATION N/A 09/20/2015   Procedure: Left Heart Cath and Coronary Angiography;  Surgeon: [name], MD;  Location: [facility];  Service: Cardiovascular;  Laterality: N/A;   CHOLECYSTECTOMY  2003   COLONOSCOPY  06/19/2012   Procedure: COLONOSCOPY;  Surgeon: [name], MD;  [facility];  Service: Endoscopy;  Laterality: N/A;  930   COLONOSCOPY N/A 10/17/2017   Procedure: COLONOSCOPY;  Surgeon: [name], MD;  [facility];  Service: Endoscopy;  Laterality: N/A;  1225   COLONOSCOPY WITH PROPOFOL N/A 11/08/2020   Procedure: COLONOSCOPY WITH PROPOFOL;  Surgeon: [name], MD;  [facility];  Service: Gastroenterology;  Laterality: N/A;  10:45   COLONOSCOPY WITH PROPOFOL N/A 04/04/2021   Procedure: COLONOSCOPY WITH PROPOFOL;  Surgeon: [name], MD;  [facility];  Service: Gastroenterology;  Laterality: N/A;   CYST EXCISION  07/2019   ESOPHAGOGASTRODUODENOSCOPY (EGD) WITH PROPOFOL N/A 01/21/2019   Procedure:  ESOPHAGOGASTRODUODENOSCOPY (EGD) WITH PROPOFOL;  Surgeon: [name], MD;  [facility];  Service: Endoscopy;  Laterality: N/A;  10:15am   ESOPHAGOGASTRODUODENOSCOPY (EGD) WITH PROPOFOL N/A 02/17/2021   Procedure: ESOPHAGOGASTRODUODENOSCOPY (EGD) WITH PROPOFOL;  Surgeon: [name], MD;  [facility];  Service: Gastroenterology;  Laterality: N/A;  10:20   ESOPHAGOGASTRODUODENOSCOPY (EGD) WITH PROPOFOL N/A 04/04/2021   Procedure: ESOPHAGOGASTRODUODENOSCOPY (EGD) WITH PROPOFOL;  Surgeon: [name], MD;  [facility];  Service: Gastroenterology;  Laterality: N/A;  12:30   KNEE ARTHROPLASTY Right 1999   LEFT HEART CATH AND CORONARY ANGIOGRAPHY N/A 02/26/2018   Procedure: LEFT HEART CATH AND CORONARY ANGIOGRAPHY;  Surgeon: [name], MD;  Location: [facility];  Service: Cardiovascular;  Laterality: N/A;   LEFT HEART CATHETERIZATION WITH CORONARY ANGIOGRAM N/A 12/09/2013   Procedure: LEFT HEART CATHETERIZATION WITH CORONARY ANGIOGRAM;  Surgeon: [name], MD;  Location: [facility];  Service: Cardiovascular;  Laterality: N/A;   Liver biopsy     POLYPECTOMY  10/17/2017   Procedure: POLYPECTOMY;  Surgeon: [name], MD;  [facility];  Service: Endoscopy;;  ascending colon, splenic  flexure,sigmoid x2   POLYPECTOMY  11/08/2020   Procedure: POLYPECTOMY;  Surgeon: [name], MD;  [facility];  Service: Gastroenterology;;   POLYPECTOMY  04/04/2021   Procedure: POLYPECTOMY;  Surgeon: [name], MD;  [facility];  Service: Gastroenterology;;   TENNIS ELBOW RELEASE/NIRSCHEL PROCEDURE Right 07/25/2017   Procedure: TENNIS ELBOW RELEASE and debriedment;  Surgeon: [name], MD;  Location: [facility];  Service: Orthopedics;  Laterality: Right;   TONSILLECTOMY  1970's    Family History: Family History  Problem Relation Age of Onset    Osteoporosis Mother    Cancer Maternal Aunt    CVA Maternal Grandmother    CVA Maternal Grandfather    Healthy Son     Social History: Social History   Tobacco Use  Smoking Status Former   Packs/day: 0.50   Years: 10.00   Pack years: 5.00   Types: Cigarettes   Start date: 02/02/1973   Quit date: 09/17/1983   Years since quitting: 38.0  Smokeless Tobacco Former   Types: Chew   Quit date: 09/17/1983   Social History   Substance and Sexual Activity  Alcohol Use No   Alcohol/week: 0.0 standard drinks   Social History   Substance and Sexual Activity  Drug Use No    Allergies: Allergies  Allergen Reactions   Doxycycline     Severe esophagitis    Medications: Current Outpatient Medications  Medication Sig Dispense Refill   acetaminophen (TYLENOL) 500 MG tablet Take 1,000 mg by mouth every 8 (eight) hours as needed for moderate pain.     albuterol (VENTOLIN HFA) 108 (90 Base) MCG/ACT inhaler INHALE 2 PUFFS INTO LUNGS EVERY 6 HOURS AS NEEDED FOR WHEEZING AND SHORTNESS OF BREATH. (Patient taking differently: Inhale 2 puffs into the lungs every 6 (six) hours as needed for shortness of breath or wheezing. INHALE 2 PUFFS INTO LUNGS EVERY 6 HOURS AS NEEDED FOR WHEEZING AND SHORTNESS OF BREATH.) 8.5 g 5   ALPRAZolam (XANAX) 1 MG tablet Take 1 tablet (1 mg total) by mouth 3 (three) times daily as needed for anxiety. 90 tablet 2   aspirin 81 MG EC tablet Take 1 tablet (81 mg total) by mouth daily. 30 tablet 12   atorvastatin (LIPITOR) 40 MG tablet TAKE ONE TABLET (40MG TOTAL) BY MOUTH DAILY 90 tablet 1   B-D UF III MINI PEN NEEDLES 31G X 5 MM MISC USE UP TO 4 TIMES DAILY WITH INSULIN AS DIRECTED. 100 each 3   cetirizine (ZYRTEC) 10 MG tablet Take 1 tablet (10 mg total) by mouth daily. 30 tablet 0   colestipol (COLESTID) 1 g tablet Take 2 tablets (2 g total) by mouth daily. Take 4 hours apart from other medication 180 tablet 3   Continuous Blood Gluc Sensor (FREESTYLE LIBRE 14 DAY  SENSOR) MISC Inject 1 each into the skin every 14 (fourteen) days. Use as directed. 2 each 2   dapagliflozin propanediol (FARXIGA) 5 MG TABS tablet Take 5 mg by mouth daily.     Doxylamine-Pyridoxine 10-10 MG TBEC Take 1 each by mouth every 12 (twelve) hours as needed (nausea). 60 tablet 3   Exenatide ER (BYDUREON BCISE) 2 MG/0.85ML AUIJ Inject 2 mg into the skin every Monday.     fluticasone (FLONASE) 50 MCG/ACT nasal spray Place 2 sprays into both nostrils daily. (Patient taking differently: Place 2 sprays into both nostrils daily as needed for allergies.) 16 g 0   fluticasone-salmeterol (ADVAIR HFA) 115-21 MCG/ACT inhaler  Inhale 2 puffs into the lungs 2 (two) times daily. 1 Inhaler 5   gabapentin (NEURONTIN) 300 MG capsule Take 2 qam and 2 in the evening. 120 capsule 5   HUMALOG KWIKPEN 200 UNIT/ML KwikPen Inject 30-35 Units into the skin 3 (three) times daily.     ipratropium (ATROVENT) 0.06 % nasal spray Place 2 sprays into both nostrils in the morning and at bedtime.     Lancets MISC 1 each by Does not apply route 4 (four) times daily. 150 each 5   losartan (COZAAR) 25 MG tablet TAKE (1) TABLET BY MOUTH ONCE DAILY. (Patient taking differently: Take 25 mg by mouth daily.) 90 tablet 3   metFORMIN (GLUCOPHAGE) 500 MG tablet TAKE 2 TABLETS BY MOUTH TWICE DAILY WITH A MEAL. (Patient taking differently: Take 1,000 mg by mouth 2 (two) times daily with a meal.) 120 tablet 0   methocarbamol (ROBAXIN) 500 MG tablet Take 500 mg by mouth every 8 (eight) hours as needed for muscle spasms.     nitroGLYCERIN (NITROSTAT) 0.4 MG SL tablet Place 1 tablet (0.4 mg total) under the tongue every 5 (five) minutes as needed. 25 tablet 3   ondansetron (ZOFRAN) 4 MG tablet Take 4 mg by mouth every 8 (eight) hours as needed for nausea or vomiting.     propranolol (INDERAL) 20 MG tablet Take 1 tablet (20 mg total) by mouth 2 (two) times daily. 180 tablet 0   ranolazine (RANEXA) 1000 MG SR tablet TAKE ONE TABLET (1000MG  TOTAL) BY MOUTH TWO TIMES DAILY 180 tablet 1   sertraline (ZOLOFT) 100 MG tablet Take 1.5 tablets (150 mg total) by mouth daily. 135 tablet 2   TOUJEO MAX SOLOSTAR 300 UNIT/ML Solostar Pen INJECT 130 UNITS INTO THE SKIN AT BEDTIME. (Patient taking differently: Inject 40-50 Units into the skin at bedtime.) 12 mL 0   traZODone (DESYREL) 50 MG tablet Take 1 tablet (50 mg total) by mouth at bedtime as needed. 30 tablet 2   Omega-3 Fatty Acids (FISH OIL) 1200 MG CAPS Take 2 capsules (2,400 mg total) by mouth 2 (two) times daily. (Patient not taking: Reported on 09/07/2021)     omeprazole (PRILOSEC) 40 MG capsule Take 1 capsule (40 mg total) by mouth daily. 90 capsule 3   No current facility-administered medications for this visit.    Review of Systems: GENERAL: negative for malaise, night sweats HEENT: No changes in hearing or vision, no nose bleeds or other nasal problems. NECK: Negative for lumps, goiter, pain and significant neck swelling RESPIRATORY: Negative for cough, wheezing CARDIOVASCULAR: Negative for chest pain, leg swelling, palpitations, orthopnea GI: SEE HPI MUSCULOSKELETAL: Negative for joint pain or swelling, back pain, and muscle pain. SKIN: Negative for lesions, rash PSYCH: Negative for sleep disturbance, mood disorder and recent psychosocial stressors. HEMATOLOGY Negative for prolonged bleeding, bruising easily, and swollen nodes. ENDOCRINE: Negative for cold or heat intolerance, polyuria, polydipsia and goiter. NEURO: negative for tremor, gait imbalance, syncope and seizures. The remainder of the review of systems is noncontributory.   Physical Exam: BP 135/84 (BP Location: Left Arm, Patient Position: Sitting, Cuff Size: Large)    Pulse (!) 103    Temp 98.5 F (36.9 C) (Oral)    Ht 6' 2"  (1.88 m)    Wt 260 lb 14.4 oz (118.3 kg)    BMI 33.50 kg/m  GENERAL: The patient is AO x3, in no acute distress. HEENT: Head is normocephalic and atraumatic. EOMI are intact. Mouth is  well hydrated and without  lesions. NECK: Supple. No masses LUNGS: Clear to auscultation. No presence of rhonchi/wheezing/rales. Adequate chest expansion HEART: RRR, normal s1 and s2. ABDOMEN: Soft, nontender, no guarding, no peritoneal signs, and nondistended. BS +. No masses. EXTREMITIES: Without any cyanosis, clubbing, rash, lesions or edema. NEUROLOGIC: AOx3, no focal motor deficit. SKIN: no jaundice, no rashes  Imaging/Labs: as above  I personally reviewed and interpreted the available labs, imaging and endoscopic files.  Impression and Plan: Jared Griffin is a 60 y.o. male with past medical history COPD, CAD s/p stent, diabetes c/b neuropathy , HLD, NASH and IBS-D, who presents for follow up of diarrhea and abdominal pain.  Patient has presented chronic recurrent and intermittent episodes of diarrhea and abdominal pain for multiple months.  He has had an extensive work-up that has rule out pancreatic insufficiency, celiac disease, gastrointestinal infections (although he had E. coli 0157 that resolved with conservative treatment), microscopic colitis or colonic inflammatory bowel disease.  He disease has been very challenging as no antidiarrhea has led to significant improvement of his symptoms and he did not improve with a trial of Xifaxan.  Notably, he had negative findings on a CT abdomen and pelvis with IV contrast performed a year ago.  Due to this, we will attempt to evaluate if there is any ongoing inflammation or intraluminal pathology in his small bowel explain his diarrhea with a capsule endoscopy.  For now, he may benefit from taking colestipol 2 g every day, especially as he does not have a gallbladder, but he can continue taking Imodium as needed for now.  As he has presented some evidence of improvement after stopping lactose-based products, it is possible he has some component of lactose intolerance.  I advised him to avoid taking milk based products or to take lactase  supplementation.  He can stop taking Bentyl and hyoscyamine for now.  Finally, he h may try using combination of doxylamine and pyridoxine for recurrent nausea as he did not have any improvement with Zofran.  I advised him that if he tries other antiemetics like Reglan or promethazine, this may worsen his diarrhea so we will try this other option.  - Stop all milk based products or if taking any, you can try over the counter lactase enzymes - Schedule capsule endoscopy - Start on colestipol 2 g qday - Continue Imodium as needed for diarrhea -Stop Bentyl (dicyclomine) and hyoscyamine (Levsin) - Stop Zofran - Can try doxylamine-pyridoxine for nausea  All questions were answered.      [name], MD Gastroenterology and Hepatology [facility]

## 2021-09-13 ENCOUNTER — Encounter (HOSPITAL_COMMUNITY)
Admission: RE | Disposition: A | Discharge status: Home / Self Care | Payer: Self-pay | Source: Ambulatory Visit | Attending: Gastroenterology

## 2021-09-13 ENCOUNTER — Ambulatory Visit (HOSPITAL_COMMUNITY)
Admission: RE | Admit: 2021-09-13 | Discharge: 2021-09-13 | Disposition: A | Discharge status: Home / Self Care | Payer: Medicare Other | Source: Ambulatory Visit | Attending: Gastroenterology | Admitting: Gastroenterology

## 2021-09-13 DIAGNOSIS — Z9889 Other specified postprocedural states: Secondary | ICD-10-CM

## 2021-09-13 DIAGNOSIS — K591 Functional diarrhea: Secondary | ICD-10-CM

## 2021-09-13 DIAGNOSIS — K529 Noninfective gastroenteritis and colitis, unspecified: Secondary | ICD-10-CM | POA: Diagnosis not present

## 2021-09-13 HISTORY — PX: GIVENS CAPSULE STUDY: SHX5432

## 2021-09-13 SURGERY — IMAGING PROCEDURE, GI TRACT, INTRALUMINAL, VIA CAPSULE

## 2021-09-19 ENCOUNTER — Other Ambulatory Visit: Payer: Self-pay | Admitting: Family Medicine

## 2021-09-19 ENCOUNTER — Encounter (HOSPITAL_COMMUNITY): Payer: Self-pay | Admitting: Gastroenterology

## 2021-09-19 NOTE — Procedures (Signed)
Small Bowel [place] Capsule Study Procedure date:  09/13/2021  Referring Provider:  PCP PCP:  Dr. [name], MD  Indication for procedure:  Chronic diarrhea  Findings:  Adequate study as there was presence of adequate prep and the endoscopic capsule reached the cecum.  There was no presence of any active inflammation or intraluminal/mucosal alterations that could account for the patient's symptoms.  First Gastric image:  00:00:33 First Duodenal image: 01:25:08 First Cecal image: 04:38:18 Gastric Passage time: 1h 89mSmall Bowel Passage time:  3h 147m  Summary & Recommendations: Unremarkable capsule endoscopy.  There was no presence of any active inflammation or intraluminal/mucosal alterations that could account for the patient's symptoms.  Recommendations: -Continue colestipol 2 g every day, can uptitrate as needed -Can continue taking Imodium as needed for recurrent diarrhea -May consider adding a TCA at low-dose if persistent symptoms but this may need to be discussed further with prescribing psychiatrist as patient is taking Zoloft 150 mg every day.  I attempted to communicate these recommendations to the patient but he did not pick up the phone, I left a detailed voice message explaining the current recommendations.  DaMaylon PeppersMD Gastroenterology and Hepatology [facility]

## 2021-10-05 ENCOUNTER — Other Ambulatory Visit: Payer: Self-pay | Admitting: Family Medicine

## 2021-10-09 ENCOUNTER — Telehealth: Payer: Self-pay | Admitting: Physical Medicine and Rehabilitation

## 2021-10-09 NOTE — Telephone Encounter (Signed)
Patient called needing to schedule an appointment with Dr. Ernestina Patches for his back. The number to contact patient is [phone]

## 2021-10-11 ENCOUNTER — Emergency Department (HOSPITAL_COMMUNITY): Payer: Medicare Other

## 2021-10-11 ENCOUNTER — Emergency Department (HOSPITAL_COMMUNITY)
Admission: EM | Admit: 2021-10-11 | Discharge: 2021-10-11 | Disposition: A | Discharge status: Home / Self Care | Payer: Medicare Other | Attending: Emergency Medicine | Admitting: Emergency Medicine

## 2021-10-11 ENCOUNTER — Encounter (HOSPITAL_COMMUNITY): Payer: Self-pay | Admitting: Emergency Medicine

## 2021-10-11 ENCOUNTER — Other Ambulatory Visit: Payer: Self-pay

## 2021-10-11 DIAGNOSIS — Z794 Long term (current) use of insulin: Secondary | ICD-10-CM | POA: Insufficient documentation

## 2021-10-11 DIAGNOSIS — Z7951 Long term (current) use of inhaled steroids: Secondary | ICD-10-CM | POA: Diagnosis not present

## 2021-10-11 DIAGNOSIS — R519 Headache, unspecified: Secondary | ICD-10-CM | POA: Insufficient documentation

## 2021-10-11 DIAGNOSIS — Z79899 Other long term (current) drug therapy: Secondary | ICD-10-CM | POA: Insufficient documentation

## 2021-10-11 DIAGNOSIS — Z7982 Long term (current) use of aspirin: Secondary | ICD-10-CM | POA: Diagnosis not present

## 2021-10-11 DIAGNOSIS — Z7984 Long term (current) use of oral hypoglycemic drugs: Secondary | ICD-10-CM | POA: Insufficient documentation

## 2021-10-11 MED ORDER — PROCHLORPERAZINE EDISYLATE 10 MG/2ML IJ SOLN
10.0000 mg | Freq: Once | INTRAMUSCULAR | Status: AC
  Administered 2021-10-11: 03:00:00 10 mg via INTRAVENOUS
  Filled 2021-10-11: qty 2

## 2021-10-11 MED ORDER — DIPHENHYDRAMINE HCL 50 MG/ML IJ SOLN
25.0000 mg | Freq: Once | INTRAMUSCULAR | Status: AC
  Administered 2021-10-11: 03:00:00 25 mg via INTRAVENOUS
  Filled 2021-10-11: qty 1

## 2021-10-11 MED ORDER — DIAZEPAM 5 MG PO TABS
5.0000 mg | ORAL_TABLET | Freq: Once | ORAL | Status: AC
  Administered 2021-10-11: 04:00:00 5 mg via ORAL
  Filled 2021-10-11: qty 1

## 2021-10-11 NOTE — ED Triage Notes (Signed)
Pt c/o headache since 1600.

## 2021-10-11 NOTE — Discharge Instructions (Addendum)
You were seen today for headache.  Your CT scan is reassuring.  Make sure to take your blood pressure medications at home.  Follow-up with your primary physician

## 2021-10-11 NOTE — ED Provider Notes (Signed)
[facility] EMERGENCY DEPARTMENT Provider Note   CSN: [id number] Arrival date & time: 10/11/21  7544     History  Chief Complaint  Patient presents with   Headache    Jared Griffin is a 61 y.o. male.  HPI     This is a 61 year old male who presents with headache.  Patient reports he had a fairly cute onset of headache yesterday around 4 PM.  It is right-sided and radiates from the right forehead back and down into the neck.  He reports some light sensitivity.  No nausea or vomiting.  He has had 1 similar headache in the past.  He took over-the-counter pain medication with minimal relief.  He subsequently took one of his wife's oxycodone and this did not help either.  Rates his pain at 10 out of 10.  Denies vision changes, weakness, numbness, tingling.  Denies fevers or neck stiffness.  Home Medications Prior to Admission medications   Medication Sig Start Date End Date Taking? Authorizing Provider  acetaminophen (TYLENOL) 500 MG tablet Take 1,000 mg by mouth every 8 (eight) hours as needed for moderate pain.    [provider]  albuterol (VENTOLIN HFA) 108 (90 Base) MCG/ACT inhaler INHALE 2 PUFFS INTO LUNGS EVERY 6 HOURS AS NEEDED FOR WHEEZING AND SHORTNESS OF BREATH. Patient taking differently: Inhale 2 puffs into the lungs every 6 (six) hours as needed for shortness of breath or wheezing. INHALE 2 PUFFS INTO LUNGS EVERY 6 HOURS AS NEEDED FOR WHEEZING AND SHORTNESS OF BREATH. 08/10/19   Mikey Kirschner, MD  ALPRAZolam Duanne Moron) 1 MG tablet Take 1 tablet (1 mg total) by mouth 3 (three) times daily as needed for anxiety. 09/05/21 09/05/22  Cloria Spring, MD  aspirin 81 MG EC tablet Take 1 tablet (81 mg total) by mouth daily. 01/22/19   [name], MD  atorvastatin (LIPITOR) 40 MG tablet TAKE ONE TABLET (40MG TOTAL) BY MOUTH DAILY 05/08/21   Satira Sark, MD  B-D UF III MINI PEN NEEDLES 31G X 5 MM MISC USE UP TO 4 TIMES DAILY WITH INSULIN AS DIRECTED. 03/09/21    Cassandria Anger, MD  cetirizine (ZYRTEC) 10 MG tablet Take 1 tablet (10 mg total) by mouth daily. 09/13/19   Wurst, [place], PA-C  colestipol (COLESTID) 1 g tablet Take 2 tablets (2 g total) by mouth daily. Take 4 hours apart from other medication 09/07/21   Montez Morita, Quillian Quince, MD  Continuous Blood Gluc Sensor (FREESTYLE LIBRE 14 DAY SENSOR) MISC Inject 1 each into the skin every 14 (fourteen) days. Use as directed. 12/24/19   Cassandria Anger, MD  dapagliflozin propanediol (FARXIGA) 5 MG TABS tablet Take 5 mg by mouth daily. 03/31/20   [provider]  Doxylamine-Pyridoxine 10-10 MG TBEC Take 1 each by mouth every 12 (twelve) hours as needed (nausea). 09/07/21   [name], MD  Exenatide ER (BYDUREON BCISE) 2 MG/0.85ML AUIJ Inject 2 mg into the skin every Monday. 03/31/20   [provider]  fluticasone (FLONASE) 50 MCG/ACT nasal spray Place 2 sprays into both nostrils daily. Patient taking differently: Place 2 sprays into both nostrils daily as needed for allergies. 09/13/19   Wurst, [place], PA-C  fluticasone-salmeterol (ADVAIR HFA) [phone] MCG/ACT inhaler INHALE 2 PUFFS INTO THE LUNGS TWICE DAILY 09/20/21   [name], MD  gabapentin (NEURONTIN) 300 MG capsule TAKE TWO CAPSULES BY MOUTH EVERY MORNINGAND TWO CAPSULES IN THE EVENING 10/05/21   [name], MD  HUMALOG KWIKPEN 200 UNIT/ML KwikPen Inject 30-35 Units into the skin 3 (three) times daily. 10/18/20   [provider]  ipratropium (ATROVENT) 0.06 % nasal spray Place 2 sprays into both nostrils in the morning and at bedtime.    [provider]  Lancets MISC 1 each by Does not apply route 4 (four) times daily. 09/24/19   Cassandria Anger, MD  losartan (COZAAR) 25 MG tablet TAKE (1) TABLET BY MOUTH ONCE DAILY. Patient taking differently: Take 25 mg by mouth daily. 10/07/20   Satira Sark, MD  metFORMIN (GLUCOPHAGE) 500 MG tablet TAKE 2 TABLETS BY MOUTH TWICE DAILY  WITH A MEAL. Patient taking differently: Take 1,000 mg by mouth 2 (two) times daily with a meal. 05/02/20   Nida, Marella Chimes, MD  methocarbamol (ROBAXIN) 500 MG tablet Take 500 mg by mouth every 8 (eight) hours as needed for muscle spasms.    [provider]  nitroGLYCERIN (NITROSTAT) 0.4 MG SL tablet Place 1 tablet (0.4 mg total) under the tongue every 5 (five) minutes as needed. 07/28/21   Satira Sark, MD  Omega-3 Fatty Acids (FISH OIL) 1200 MG CAPS Take 2 capsules (2,400 mg total) by mouth 2 (two) times daily. Patient not taking: Reported on 09/07/2021 06/02/21   [name], MD  omeprazole (PRILOSEC) 40 MG capsule Take 1 capsule (40 mg total) by mouth daily. 04/04/21   [name], MD  ondansetron (ZOFRAN) 4 MG tablet Take 4 mg by mouth every 8 (eight) hours as needed for nausea or vomiting.    [provider]  propranolol (INDERAL) 20 MG tablet Take 1 tablet (20 mg total) by mouth 2 (two) times daily. 07/17/21   Tat, Eustace Quail, DO  ranolazine (RANEXA) 1000 MG SR tablet TAKE ONE TABLET (1000MG TOTAL) BY MOUTH TWO TIMES DAILY 08/23/21   Satira Sark, MD  sertraline (ZOLOFT) 100 MG tablet Take 1.5 tablets (150 mg total) by mouth daily. 09/05/21   Cloria Spring, MD  TOUJEO MAX SOLOSTAR 300 UNIT/ML Solostar Pen INJECT 130 UNITS INTO THE SKIN AT BEDTIME. Patient taking differently: Inject 40-50 Units into the skin at bedtime. 04/11/20   Cassandria Anger, MD  traZODone (DESYREL) 50 MG tablet Take 1 tablet (50 mg total) by mouth at bedtime as needed. 04/28/21   Cloria Spring, MD      Allergies    Doxycycline    Review of Systems   Review of Systems  Eyes:  Positive for photophobia.  Neurological:  Positive for headaches. Negative for dizziness.  All other systems reviewed and are negative.  Physical Exam Updated Vital Signs BP (!) 156/88    Pulse 68    Temp 97.6 F (36.4 C) (Oral)    Resp 16    Ht 1.88 m (6' 2" )    Wt 117.9 kg    SpO2  94%    BMI 33.38 kg/m  Physical Exam Vitals and nursing note reviewed.  Constitutional:      Appearance: He is well-developed. He is not ill-appearing.  HENT:     Head: Normocephalic and atraumatic.     Mouth/Throat:     Mouth: Mucous membranes are moist.  Eyes:     Extraocular Movements: Extraocular movements intact.     Pupils: Pupils are equal, round, and reactive to light.  Cardiovascular:     Rate and Rhythm: Normal rate and regular rhythm.     Heart sounds: Normal heart sounds. No murmur heard. Pulmonary:  Effort: Pulmonary effort is normal. No respiratory distress.     Breath sounds: Normal breath sounds. No wheezing.  Abdominal:     Palpations: Abdomen is soft.     Tenderness: There is no abdominal tenderness. There is no rebound.  Musculoskeletal:     Cervical back: Normal range of motion and neck supple.  Lymphadenopathy:     Cervical: No cervical adenopathy.  Skin:    General: Skin is warm and dry.  Neurological:     Mental Status: He is alert and oriented to person, place, and time.     Comments: Cranial nerves II through XII intact, 5 out of 5 strength in all 4 extremities, no dysmetria to finger-nose-finger  Psychiatric:        Mood and Affect: Mood normal.    ED Results / Procedures / Treatments   Labs (all labs ordered are listed, but only abnormal results are displayed) Labs Reviewed - No data to display  EKG None  Radiology CT Head Wo Contrast  Result Date: 10/11/2021 CLINICAL DATA:  Headache. EXAM: CT HEAD WITHOUT CONTRAST TECHNIQUE: Contiguous axial images were obtained from the base of the skull through the vertex without intravenous contrast. RADIATION DOSE REDUCTION: This exam was performed according to the departmental dose-optimization program which includes automated exposure control, adjustment of the mA and/or kV according to patient size and/or use of iterative reconstruction technique. COMPARISON:  Head CT dated 03/14/2021. FINDINGS:  Brain: The ventricles and sulci are appropriate size for the patient's age. The gray-white matter discrimination is preserved. There is no acute intracranial hemorrhage. No mass effect or midline shift. No extra-axial fluid collection. Vascular: No hyperdense vessel or unexpected calcification. Skull: Normal. Negative for fracture or focal lesion. Sinuses/Orbits: No acute finding. Other: None IMPRESSION: No acute intracranial pathology. Electronically Signed   By: Anner Crete M.D.   On: 10/11/2021 03:29    Procedures Procedures    Medications Ordered in ED Medications  prochlorperazine (COMPAZINE) injection 10 mg (10 mg Intravenous Given 10/11/21 0311)  diphenhydrAMINE (BENADRYL) injection 25 mg (25 mg Intravenous Given 10/11/21 0311)  diazepam (VALIUM) tablet 5 mg (5 mg Oral Given 10/11/21 0417)    ED Course/ Medical Decision Making/ A&P Clinical Course as of 10/11/21 0520  Wed Oct 11, 2021  0409 Improvement of pain to 6 out of 10.  Reports ongoing neck muscle tenderness.  Dosed Valium. [CH]    Clinical Course User Index [CH] Vertie Dibbern, Barbette Hair, MD                           Medical Decision Making Amount and/or Complexity of Data Reviewed Radiology: ordered.  Risk Prescription drug management.   This patient presents to the ED for concern of headache, this involves an extensive number of treatment options, and is a complaint that carries with it a high risk of complications and morbidity.  The differential diagnosis includes migraine, tension headache, less likely subarachnoid hemorrhage or meningitis  MDM:    This is a 61 year old male who presents with headache.  He is nontoxic-appearing.  Vital signs notable for blood pressure of 186/106.  He has a history of hypertension.  No red flags.  No neck pain or stiffness to suggest meningitis.  Headache was gradual in onset which would argue against subarachnoid hemorrhage.  He is neurologically intact.  Given his blood pressure  however, will obtain a CT scan to rule out occult bleed.  Patient was given a migraine  cocktail.  CT scan does not show any evidence of intracranial hemorrhage.  On recheck, he feels better after migraine cocktail.  He is reporting some persistent neck discomfort.  Suspect this may be a tension headache.  He was given Valium for muscle relaxation.  And again on recheck states he feels much better.  He remains hemodynamically stable and neurologically intact. (Labs, imaging)  Labs: I Ordered, and personally interpreted labs.  The pertinent results include: None  Imaging Studies ordered: I ordered imaging studies including CT head I independently visualized and interpreted imaging. I agree with the radiologist interpretation  Additional history obtained from wife.  External records from outside source obtained and reviewed including prior visit  Critical Interventions: IV Compazine, Benadryl, Valium  Consultations: I requested consultation with the none,  and discussed lab and imaging findings as well as pertinent plan - they recommend: None  Cardiac Monitoring: The patient was maintained on a cardiac monitor.  I personally viewed and interpreted the cardiac monitored which showed an underlying rhythm of: Normal sinus rhythm  Reevaluation: After the interventions noted above, I reevaluated the patient and found that they have :resolved   Considered admission for: N/A  Social Determinants of Health: Lives with wife  Disposition: Discharge  Co morbidities that complicate the patient evaluation  Past Medical History:  Diagnosis Date   Anxiety    COPD (chronic obstructive pulmonary disease) ([place])    Coronary atherosclerosis of native coronary artery    a. 12/09/2013: s/p PCI with 3.0 x 28 Promus DES to mLAD   Depression    Diabetic neuropathy ([place])    Elbow injury 07/25/2017   Surgery for torn tendon   Essential hypertension    Fatty liver disease, nonalcoholic    GERD  (gastroesophageal reflux disease)    Iron deficiency anemia 08/15/2021   Lateral epicondylitis of right elbow    Mixed hyperlipidemia    Obesity    Osgood-Schlatter's disease of right knee    Skin cyst 10/03/2017   Removed from back   Type 2 diabetes mellitus ([place])      Medicines Meds ordered this encounter  Medications   prochlorperazine (COMPAZINE) injection 10 mg   diphenhydrAMINE (BENADRYL) injection 25 mg   diazepam (VALIUM) tablet 5 mg    I have reviewed the patients home medicines and have made adjustments as needed  Problem List / ED Course: Problem List Items Addressed This Visit   None Visit Diagnoses     Bad headache    -  Primary                   Final Clinical Impression(s) / ED Diagnoses Final diagnoses:  Bad headache    Rx / DC Orders ED Discharge Orders     None         Merryl Hacker, MD 10/11/21 [phone]

## 2021-10-18 ENCOUNTER — Telehealth (HOSPITAL_COMMUNITY): Payer: Self-pay | Admitting: Psychiatry

## 2021-10-18 ENCOUNTER — Encounter: Payer: Self-pay | Admitting: Cardiology

## 2021-10-18 ENCOUNTER — Telehealth: Payer: Self-pay | Admitting: Physical Medicine and Rehabilitation

## 2021-10-18 NOTE — Telephone Encounter (Signed)
Patient called needing to reschedule his appointment. The number to contact patient is [phone]

## 2021-10-18 NOTE — Telephone Encounter (Signed)
Called to schedule f/u appt unable to leave vm

## 2021-10-19 ENCOUNTER — Ambulatory Visit: Payer: Medicare Other | Admitting: Physical Medicine and Rehabilitation

## 2021-10-23 ENCOUNTER — Ambulatory Visit: Payer: Medicare Other | Admitting: Physical Medicine and Rehabilitation

## 2021-10-24 ENCOUNTER — Other Ambulatory Visit: Payer: Self-pay

## 2021-10-24 ENCOUNTER — Ambulatory Visit: Payer: Medicare Other | Admitting: Physical Medicine and Rehabilitation

## 2021-10-24 ENCOUNTER — Encounter: Payer: Self-pay | Admitting: Physical Medicine and Rehabilitation

## 2021-10-24 VITALS — BP 142/92 | HR 90

## 2021-10-24 DIAGNOSIS — M545 Low back pain, unspecified: Secondary | ICD-10-CM | POA: Diagnosis not present

## 2021-10-24 DIAGNOSIS — M47816 Spondylosis without myelopathy or radiculopathy, lumbar region: Secondary | ICD-10-CM | POA: Diagnosis not present

## 2021-10-24 DIAGNOSIS — G8929 Other chronic pain: Secondary | ICD-10-CM | POA: Diagnosis not present

## 2021-10-24 DIAGNOSIS — M7918 Myalgia, other site: Secondary | ICD-10-CM | POA: Diagnosis not present

## 2021-10-24 MED ORDER — TRAMADOL HCL 50 MG PO TABS
50.0000 mg | ORAL_TABLET | Freq: Three times a day (TID) | ORAL | 0 refills | Status: AC | PRN
Start: 2021-10-24 — End: 2021-10-29

## 2021-10-24 NOTE — Progress Notes (Signed)
Pt state lower back pain. Pt state bending and yard work makes the pain worse. Pt state he takes over the counter pain meds and uses heat and ice to help ease his pain.  Numeric Pain Rating Scale and Functional Assessment Average Pain 10 Pain Right Now 7 My pain is intermittent, sharp, burning, dull, stabbing, and aching Pain is worse with: bending, standing, and some activites Pain improves with: heat/ice, medication, and injections   In the last MONTH (on 0-10 scale) has pain interfered with the following?  1. General activity like being  able to carry out your everyday physical activities such as walking, climbing stairs, carrying groceries, or moving a chair?  Rating(7)  2. Relation with others like being able to carry out your usual social activities and roles such as  activities at home, at work and in your community. Rating(8)  3. Enjoyment of life such that you have  been bothered by emotional problems such as feeling anxious, depressed or irritable?  Rating(9)

## 2021-10-24 NOTE — Progress Notes (Signed)
Jared Griffin - 61 y.o. male MRN [id number]  Date of birth: [birth year]  Office Visit Note: Visit Date: 10/24/2021 PCP: [name], MD Referred by: [name], MD  Subjective: Chief Complaint  Patient presents with   Lower Back - Pain   HPI: Jared Griffin is a 61 y.o. male who comes in today for evaluation of chronic, worsening and severe bilateral lower back pain right greater than left.  Patient reports pain has been ongoing for several years. Patient states pain has increased over the last several months after performing yard work. Patient reports pain is exacerbated by movement and activity, describes his pain as a constant sore sensation, currently rates a 7 out of 10.  Patient reports some relief of pain with rest and use of medications. Patient continues to take Gabapentin.  Patient has attended formal physical therapy in the past and reports no relief of pain with these treatments.  Patient's lumbar MRI from 2021 exhibits transitional lumbosacral anatomy and scattered facet arthrosis most significant at L3-L4.  No high-grade spinal canal stenosis noted. Patient had right L3-L4 interlaminar epidural steroid injection on 01/25/2020 and reports significant and sustained relief of pain until recently. Patient denies focal weakness, numbness and tingling. Patient denies recent trauma or falls.   Review of Systems  Musculoskeletal:  Positive for back pain and myalgias.  Neurological:  Negative for tingling, sensory change, focal weakness and weakness.  All other systems reviewed and are negative. Otherwise per HPI.  Assessment & Plan: Visit Diagnoses:    ICD-10-CM   1. Spondylosis without myelopathy or radiculopathy, lumbar region  M47.816 Ambulatory referral to Physical Medicine Rehab    2. Chronic bilateral low back pain without sciatica  M54.50 Ambulatory referral to Physical Medicine Rehab   G89.29     3. Facet hypertrophy of lumbar region  M47.816 Ambulatory referral  to Physical Medicine Rehab    4. Myofascial pain syndrome  M79.18 Ambulatory referral to Physical Medicine Rehab       Plan: Findings:  Chronic, worsening and severe bilateral lower back pain. Patient continues to have excruciating  and debilitating pain despite good conservative therapies such as formal physical therapy, rest and use of medications. Patients clinical presentation and exam are complex. Differentials could include discogenic vs mechanical pain. Patient did not get good pain relief with previous intra-articular facet joint injections in 2021, however he did get significant relief of pain with right L3-L4 interlaminar epidural steroid injection. We feel the next step would be to repeat right L3-L4 interlaminar epidural steroid injection under fluoroscopic guidance. Patient is not currently undergoing long term anticoagulation therapy. I did prescribe patient a short course of Tramadol today to take as needed for moderate/severe pain. Patient encouraged to remain active. No red flag symptoms noted upon exam today.    Meds & Orders:  Meds ordered this encounter  Medications   traMADol (ULTRAM) 50 MG tablet    Sig: Take 1 tablet (50 mg total) by mouth every 8 (eight) hours as needed for up to 5 days for moderate pain or severe pain.    Dispense:  15 tablet    Refill:  0    Order Specific Question:   Supervising Provider    Answer:   Magnus Sinning [[id number]]    Orders Placed This Encounter  Procedures   Ambulatory referral to Physical Medicine Rehab    Follow-up: Return for Right L3-L4 interlaminar epidural steroid injection.   Procedures: No procedures performed  Clinical History: Rozanna Box, MD - 05/26/2020  Formatting of this note might be different from the original.  EXAMINATION: MRI cervical spine   CLINICAL INDICATION:Severe headaches, neck pain, pain at the base of the skull   TECHNIQUE: MRI cervical spine without contrast.   COMPARISON:None    FINDINGS:   The bone marrow signal is normal.   The craniocervical junction is normal.   The cervical spinal cord is normal.   C2-C3: Normal   C3-C4: Normal   C4-C5: There is a uncovertebral spur on the left. The facet joints are normal. The disc height is preserved. There is a moderate left neuroforaminal stenosis.   C5-C6: The disc height is preserved. There are bilateral uncovertebral spurs. The facet joints are normal. There is moderate bilateral neuroforaminal stenosis.   C6-C7: The disc height is preserved. There is a disc bulge with left paracentral disc herniation. The disc herniation effaces the thecal sac on the left but does not compress the cord or nerve root. The facet joints are normal. There is no significant foraminal stenosis.   C7-T1: The disc height is preserved. There is a right paracentral disc bulge which partially effaces the thecal sac on the right. It does not compress the cord or nerve root. The facet joints are normal. No foraminal stenosis.    IMPRESSION:  1. No abnormality in the region of the skull base is identified.  2. Moderate left neuroforaminal stenosis at C4-5 due to an uncovertebral spur  3. Moderate bilateral neuroforaminal stenosis at C5-C6 due to bilateral uncovertebral spurs  4. Left paracentral disc herniation at C6-7 which effaces the thecal sac on the left but does not compress the cord or nerve root.  5. Right paracentral disc bulge at C7-T1 which partially effaces the thecal sac on the right. No nerve root compression.     Electronically Signed by: Michael Boston Exam End: 05/26/20 12:54   He reports that he quit smoking about 38 years ago. His smoking use included cigarettes. He started smoking about 48 years ago. He has a 5.00 pack-year smoking history. He quit smokeless tobacco use about 38 years ago.  His smokeless tobacco use included chew. No results for input(s): HGBA1C, LABURIC in the last 8760 hours.  Objective:  VS:  HT:      WT:    BMI:      BP:(!) 142/92   HR:90bpm   TEMP: ( )   RESP:  Physical Exam Vitals and nursing note reviewed.  HENT:     Head: Normocephalic and atraumatic.     Right Ear: External ear normal.     Left Ear: External ear normal.     Nose: Nose normal.     Mouth/Throat:     Mouth: Mucous membranes are moist.  Eyes:     Extraocular Movements: Extraocular movements intact.  Cardiovascular:     Rate and Rhythm: Normal rate.     Pulses: Normal pulses.  Pulmonary:     Effort: Pulmonary effort is normal.  Abdominal:     General: Abdomen is flat. There is no distension.  Musculoskeletal:        General: Tenderness present.     Cervical back: Normal range of motion.     Comments: Pt is slow to rise from seated position to standing. Good lumbar range of motion. Strong distal strength without clonus, no pain upon palpation of greater trochanters. Sensation intact bilaterally. Walks independently, gait steady.    Skin:    General: Skin  is warm and dry.     Capillary Refill: Capillary refill takes less than 2 seconds.  Neurological:     General: No focal deficit present.     Mental Status: He is alert and oriented to person, place, and time.  Psychiatric:        Mood and Affect: Mood normal.        Behavior: Behavior normal.    Ortho Exam  Imaging: No results found.  Past Medical/Family/Surgical/Social History: Medications & Allergies reviewed per EMR, new medications updated. Patient Active Problem List   Diagnosis Date Noted   Iron deficiency anemia 08/15/2021   Orthostatic hypotension 12/07/2020   IBS (irritable bowel syndrome) 11/17/2020   Chronic diarrhea 08/17/2020   NASH (nonalcoholic steatohepatitis) 08/17/2020   Elevated LFTs 08/17/2020   Tremor of both hands 05/06/2020   Bilateral occipital neuralgia 05/06/2020   Abdominal pain, chronic, epigastric 01/13/2019   Anxiety 01/04/2019   Radicular pain of left lower extremity 12/22/2018   Moderate persistent asthma  10/25/2018   Dizziness 07/12/2018   Nonintractable headache    Abnormal nuclear stress test    Aftercare following surgery 07/25/17 08/13/2017   Hx of colonic polyps 08/07/2017   Lateral epicondylitis of right elbow    Cervical nerve root impingement 12/09/2015   Generalized anxiety disorder 09/27/2015   Chest pain 09/20/2015   Pain in the chest    Depression 01/24/2015   Esophageal reflux 01/24/2015   Preoperative cardiovascular examination 06/28/2014   DM type 2 causing vascular disease ([place])    Mixed hyperlipidemia    Obesity    Intermediate coronary syndrome ([place]) 12/09/2013   Lumbago 05/21/2012   Essential hypertension, benign 12/28/2009   CAD S/P LAD DES March 2015 12/28/2009   Hyperlipidemia LDL goal <70 11/25/2009   Accelerating angina ([place]) 11/25/2009   DM 11/24/2009   ABDOMINAL PAIN, HX OF 11/24/2009   Past Medical History:  Diagnosis Date   Anxiety    COPD (chronic obstructive pulmonary disease) ([place])    Coronary atherosclerosis of native coronary artery    a. 12/09/2013: s/p PCI with 3.0 x 28 Promus DES to mLAD   Depression    Diabetic neuropathy ([place])    Elbow injury 07/25/2017   Surgery for torn tendon   Essential hypertension    Fatty liver disease, nonalcoholic    GERD (gastroesophageal reflux disease)    Iron deficiency anemia 08/15/2021   Lateral epicondylitis of right elbow    Mixed hyperlipidemia    Obesity    Osgood-Schlatter's disease of right knee    Skin cyst 10/03/2017   Removed from back   Type 2 diabetes mellitus ([place])    Family History  Problem Relation Age of Onset   Osteoporosis Mother    Cancer Maternal Aunt    CVA Maternal Grandmother    CVA Maternal Grandfather    Healthy Son    Past Surgical History:  Procedure Laterality Date   BIOPSY  01/21/2019   Procedure: BIOPSY;  Surgeon: [name], MD;  [facility];  Service: Endoscopy;;  gastric bx and gastric polyps   BIOPSY  11/08/2020   Procedure: BIOPSY;  Surgeon:  [name], MD;  [facility];  Service: Gastroenterology;;   CARDIAC CATHETERIZATION  2011   CARDIAC CATHETERIZATION N/A 09/20/2015   Procedure: Left Heart Cath and Coronary Angiography;  Surgeon: [name], MD;  Location: [place] [facility];  Service: Cardiovascular;  Laterality: N/A;   CHOLECYSTECTOMY  2003  COLONOSCOPY  06/19/2012   Procedure: COLONOSCOPY;  Surgeon: [name], MD;  [facility];  Service: Endoscopy;  Laterality: N/A;  930   COLONOSCOPY N/A 10/17/2017   Procedure: COLONOSCOPY;  Surgeon: [name], MD;  [facility];  Service: Endoscopy;  Laterality: N/A;  1225   COLONOSCOPY WITH PROPOFOL N/A 11/08/2020   Procedure: COLONOSCOPY WITH PROPOFOL;  Surgeon: [name], MD;  [facility];  Service: Gastroenterology;  Laterality: N/A;  10:45   COLONOSCOPY WITH PROPOFOL N/A 04/04/2021   Procedure: COLONOSCOPY WITH PROPOFOL;  Surgeon: [name], MD;  [facility];  Service: Gastroenterology;  Laterality: N/A;   CYST EXCISION  07/2019   ESOPHAGOGASTRODUODENOSCOPY (EGD) WITH PROPOFOL N/A 01/21/2019   Procedure: ESOPHAGOGASTRODUODENOSCOPY (EGD) WITH PROPOFOL;  Surgeon: [name], MD;  [facility];  Service: Endoscopy;  Laterality: N/A;  10:15am   ESOPHAGOGASTRODUODENOSCOPY (EGD) WITH PROPOFOL N/A 02/17/2021   Procedure: ESOPHAGOGASTRODUODENOSCOPY (EGD) WITH PROPOFOL;  Surgeon: [name], MD;  [facility];  Service: Gastroenterology;  Laterality: N/A;  10:20   ESOPHAGOGASTRODUODENOSCOPY (EGD) WITH PROPOFOL N/A 04/04/2021   Procedure: ESOPHAGOGASTRODUODENOSCOPY (EGD) WITH PROPOFOL;  Surgeon: [name], MD;  [facility];  Service: Gastroenterology;  Laterality: N/A;  12:30   GIVENS CAPSULE STUDY N/A 09/13/2021   Procedure: GIVENS CAPSULE STUDY;  Surgeon: [name], MD;  [facility];  Service: Gastroenterology;  Laterality: N/A;  7:30   KNEE ARTHROPLASTY Right 1999   LEFT HEART CATH AND CORONARY ANGIOGRAPHY N/A 02/26/2018   Procedure: LEFT HEART CATH AND CORONARY ANGIOGRAPHY;  Surgeon: [name], MD;  Location: [place] [facility];  Service: Cardiovascular;  Laterality: N/A;   LEFT HEART CATHETERIZATION WITH CORONARY ANGIOGRAM N/A 12/09/2013   Procedure: LEFT HEART CATHETERIZATION WITH CORONARY ANGIOGRAM;  Surgeon: [name], MD;  [facility];  Service: Cardiovascular;  Laterality: N/A;   Liver biopsy     POLYPECTOMY  10/17/2017   Procedure: POLYPECTOMY;  Surgeon: [name], MD;  [facility];  Service: Endoscopy;;  ascending colon, splenic flexure,sigmoid x2   POLYPECTOMY  11/08/2020   Procedure: POLYPECTOMY;  Surgeon: [name], MD;  [facility];  Service: Gastroenterology;;   POLYPECTOMY  04/04/2021   Procedure: POLYPECTOMY;  Surgeon: [name], MD;  [facility];  Service: Gastroenterology;;   TENNIS ELBOW RELEASE/NIRSCHEL PROCEDURE Right 07/25/2017   Procedure: TENNIS ELBOW RELEASE and debriedment;  Surgeon: [name], MD;  Location: [facility];  Service: Orthopedics;  Laterality: Right;   TONSILLECTOMY  1970's   Social History   Occupational History   Not on file  Tobacco Use   Smoking status: Former    Packs/day: 0.50    Years: 10.00    Pack years: 5.00    Types: Cigarettes    Start date: 02/02/1973    Quit date: 09/17/1983    Years since quitting: 38.1   Smokeless tobacco: Former    Types: Chew    Quit date: 09/17/1983  Vaping Use   Vaping Use: Never used  Substance and Sexual Activity   Alcohol use: No    Alcohol/week: 0.0 standard drinks   Drug use: No   Sexual activity: Yes

## 2021-10-27 ENCOUNTER — Telehealth: Payer: Self-pay | Admitting: Pharmacist

## 2021-10-27 ENCOUNTER — Telehealth: Payer: Medicare Other

## 2021-10-27 NOTE — Telephone Encounter (Signed)
°  Care Management   Follow Up Note  10/27/2021 Name: BURLIE CAJAMARCA MRN: [id number] DOB: [birth year]  Referred by: [name], MD Reason for referral : Chronic Care Management (Unsuccessful Telephone Outreach)  An unsuccessful telephone outreach was attempted today. The patient was referred to the case management team for assistance with care management and care coordination.   Follow Up Plan: The care management team will reach out to the patient again over the next 7 days.   Kennon Holter, PharmD, [place], CPP Clinical Pharmacist Practitioner [place] [phone]

## 2021-10-28 ENCOUNTER — Other Ambulatory Visit: Payer: Self-pay | Admitting: Family Medicine

## 2021-10-30 ENCOUNTER — Other Ambulatory Visit: Payer: Self-pay

## 2021-10-31 ENCOUNTER — Other Ambulatory Visit: Payer: Self-pay | Admitting: Family Medicine

## 2021-10-31 ENCOUNTER — Other Ambulatory Visit: Payer: Self-pay | Admitting: Cardiology

## 2021-10-31 DIAGNOSIS — M542 Cervicalgia: Secondary | ICD-10-CM

## 2021-10-31 DIAGNOSIS — R251 Tremor, unspecified: Secondary | ICD-10-CM

## 2021-11-02 ENCOUNTER — Ambulatory Visit: Payer: Medicare Other | Admitting: Physical Medicine and Rehabilitation

## 2021-11-02 ENCOUNTER — Telehealth: Payer: Self-pay | Admitting: Physical Medicine and Rehabilitation

## 2021-11-02 NOTE — Telephone Encounter (Signed)
Patient called needing to R/S his appointment due to family emergency. The number to contact patient is [phone]

## 2021-11-16 ENCOUNTER — Other Ambulatory Visit: Payer: Self-pay

## 2021-11-16 ENCOUNTER — Encounter: Payer: Self-pay | Admitting: Physical Medicine and Rehabilitation

## 2021-11-16 ENCOUNTER — Ambulatory Visit: Payer: Self-pay

## 2021-11-16 ENCOUNTER — Ambulatory Visit: Payer: Medicare Other | Admitting: Physical Medicine and Rehabilitation

## 2021-11-16 VITALS — BP 125/80 | HR 94

## 2021-11-16 DIAGNOSIS — M5416 Radiculopathy, lumbar region: Secondary | ICD-10-CM

## 2021-11-16 MED ORDER — METHYLPREDNISOLONE ACETATE 80 MG/ML IJ SUSP
80.0000 mg | Freq: Once | INTRAMUSCULAR | Status: AC
  Administered 2021-11-16: 80 mg

## 2021-11-16 NOTE — Patient Instructions (Signed)

## 2021-11-16 NOTE — Progress Notes (Signed)
Pt state center lower back pain. Pt state bending and yard work makes the pain worse. Pt state he takes over the counter pain meds and uses heat and ice to help ease his pain. ? ?Numeric Pain Rating Scale and Functional Assessment ?Average Pain 8 ? ? ?In the last MONTH (on 0-10 scale) has pain interfered with the following? ? ?1. General activity like being  able to carry out your everyday physical activities such as walking, climbing stairs, carrying groceries, or moving a chair?  ?Rating(10) ? ? ?+Driver, -BT, -Dye Allergies. ? ?

## 2021-11-26 NOTE — Procedures (Signed)
Lumbar Epidural Steroid Injection - Interlaminar Approach with Fluoroscopic Guidance ? ?Patient: Jared Griffin      ?Date of Birth: 21-Jul-1961 ?MRN: [id number] ?PCP: [name], MD      ?Visit Date: 11/16/2021 ?  ?Universal Protocol:    ? ?Consent Given By: the patient ? ?Position: PRONE ? ?Additional Comments: ?Vital signs were monitored before and after the procedure. ?Patient was prepped and draped in the usual sterile fashion. ?The correct patient, procedure, and site was verified. ? ? ?Injection Procedure Details:  ? ?Procedure diagnoses: Lumbar radiculopathy [M54.16]  ? ?Meds Administered:  ?Meds ordered this encounter  ?Medications  ? methylPREDNISolone acetate (DEPO-MEDROL) injection 80 mg  ?  ? ?Laterality: Right ? ?Location/Site:  L3-4 ? ?Needle: 3.5 in., 20 ga. Tuohy ? ?Needle Placement: Paramedian epidural ? ?Findings:  ? -Comments: Excellent flow of contrast into the epidural space. ? ?Procedure Details: ?Using a paramedian approach from the side mentioned above, the region overlying the inferior lamina was localized under fluoroscopic visualization and the soft tissues overlying this structure were infiltrated with 4 ml. of 1% Lidocaine without Epinephrine. The Tuohy needle was inserted into the epidural space using a paramedian approach.  ? ?The epidural space was localized using loss of resistance along with counter oblique bi-planar fluoroscopic views.  After negative aspirate for air, blood, and CSF, a 2 ml. volume of Isovue-250 was injected into the epidural space and the flow of contrast was observed. Radiographs were obtained for documentation purposes.   ? ?The injectate was administered into the level noted above. ? ? ?Additional Comments:  ?The patient tolerated the procedure well ?Dressing: 2 x 2 sterile gauze and Band-Aid ?  ? ?Post-procedure details: ?Patient was observed during the procedure. ?Post-procedure instructions were reviewed. ? ?Patient left the clinic in stable  condition. ?

## 2021-11-26 NOTE — Progress Notes (Signed)
? ?Jared Griffin - 61 y.o. male MRN [id number]  Date of birth: Feb 11, 1961 ? ?Office Visit Note: ?Visit Date: 11/16/2021 ?PCP: [name], MD ?Referred by: [name], MD ? ?Subjective: ?Chief Complaint  ?Patient presents with  ? Lower Back - Pain  ? ?HPI:  Jared Griffin is a 61 y.o. male who comes in today at the request of [name], FNP for planned Right L3-4 Lumbar Interlaminar epidural steroid injection with fluoroscopic guidance.  The patient has failed conservative care including home exercise, medications, time and activity modification.  This injection will be diagnostic and hopefully therapeutic.  Please see requesting physician notes for further details and justification. ? ?ROS Otherwise per HPI. ? ?Assessment & Plan: ?Visit Diagnoses:  ?  ICD-10-CM   ?1. Lumbar radiculopathy  M54.16 XR C-ARM NO REPORT  ?  Epidural Steroid injection  ?  methylPREDNISolone acetate (DEPO-MEDROL) injection 80 mg  ?  ?  ?Plan: No additional findings.  ? ?Meds & Orders:  ?Meds ordered this encounter  ?Medications  ? methylPREDNISolone acetate (DEPO-MEDROL) injection 80 mg  ?  ?Orders Placed This Encounter  ?Procedures  ? XR C-ARM NO REPORT  ? Epidural Steroid injection  ?  ?Follow-up: Return if symptoms worsen or fail to improve.  ? ?Procedures: ?No procedures performed  ?Lumbar Epidural Steroid Injection - Interlaminar Approach with Fluoroscopic Guidance ? ?Patient: Jared Griffin      ?Date of Birth: 1961/05/11 ?MRN: [id number] ?PCP: [name], MD      ?Visit Date: 11/16/2021 ?  ?Universal Protocol:    ? ?Consent Given By: the patient ? ?Position: PRONE ? ?Additional Comments: ?Vital signs were monitored before and after the procedure. ?Patient was prepped and draped in the usual sterile fashion. ?The correct patient, procedure, and site was verified. ? ? ?Injection Procedure Details:  ? ?Procedure diagnoses: Lumbar radiculopathy [M54.16]  ? ?Meds Administered:  ?Meds ordered this encounter   ?Medications  ? methylPREDNISolone acetate (DEPO-MEDROL) injection 80 mg  ?  ? ?Laterality: Right ? ?Location/Site:  L3-4 ? ?Needle: 3.5 in., 20 ga. Tuohy ? ?Needle Placement: Paramedian epidural ? ?Findings:  ? -Comments: Excellent flow of contrast into the epidural space. ? ?Procedure Details: ?Using a paramedian approach from the side mentioned above, the region overlying the inferior lamina was localized under fluoroscopic visualization and the soft tissues overlying this structure were infiltrated with 4 ml. of 1% Lidocaine without Epinephrine. The Tuohy needle was inserted into the epidural space using a paramedian approach.  ? ?The epidural space was localized using loss of resistance along with counter oblique bi-planar fluoroscopic views.  After negative aspirate for air, blood, and CSF, a 2 ml. volume of Isovue-250 was injected into the epidural space and the flow of contrast was observed. Radiographs were obtained for documentation purposes.   ? ?The injectate was administered into the level noted above. ? ? ?Additional Comments:  ?The patient tolerated the procedure well ?Dressing: 2 x 2 sterile gauze and Band-Aid ?  ? ?Post-procedure details: ?Patient was observed during the procedure. ?Post-procedure instructions were reviewed. ? ?Patient left the clinic in stable condition.  ? ?Clinical History: ?MRI LUMBAR SPINE WITHOUT CONTRAST ?  ?TECHNIQUE: ?Multiplanar, multisequence MR imaging of the lumbar spine was ?performed. No intravenous contrast was administered. ?  ?COMPARISON:  Plain films lumbar spine 11/10/2018. ?  ?FINDINGS: ?Segmentation: The patient has transitional lumbosacral anatomy. ?Numbering scheme on this study and is based on the lowest visible ?pair of ribs on the  comparison plain films. Based on this numbering ?scheme, there is lumbarization of the first sacral segment on the ?right and a fully formed disc at the S1-2 level. ?  ?Alignment:  Maintained. ?  ?Vertebrae: No fracture,  evidence of discitis or worrisome lesion. ?Hemangioma in L4 is noted. ?  ?Conus medullaris and cauda equina: Conus extends to the L2 level. ?Conus and cauda equina appear normal. ?  ?Paraspinal and other soft tissues: Negative. ?  ?Disc levels: ?  ?L1-2: Mild facet arthropathy.  Otherwise negative. ?  ?L2-3: Mild facet degenerative change.  Otherwise negative. ?  ?L2-3: Shallow disc bulge, ligamentum flavum thickening and mild to ?moderate facet degenerative disease. No stenosis. ?  ?L3-4: Minimal disc bulge.  No stenosis. ?  ?L5-S1: Minimal disc bulge.  No stenosis. ?  ?S1-2: Transitional segment.  Negative. ?  ?IMPRESSION: ?Transitional lumbosacral anatomy. Please see numbering scheme above ?and correlate with plain films if any intervention is planned. ?  ?Mild lumbar degenerative change without central canal or foraminal ?narrowing. Scattered facet arthropathy is most notable at L3-4. ?  ?  ?Electronically Signed ?  By: Inge Rise M.D. ?  On: 10/10/2019 10:56  ? ? ? ?Objective:  VS:  HT:    WT:   BMI:     BP:125/80  HR:94bpm  TEMP: ( )  RESP:  ?Physical Exam ?Vitals and nursing note reviewed.  ?Constitutional:   ?   General: He is not in acute distress. ?   Appearance: Normal appearance. He is not ill-appearing.  ?HENT:  ?   Head: Normocephalic and atraumatic.  ?   Right Ear: External ear normal.  ?   Left Ear: External ear normal.  ?   Nose: No congestion.  ?Eyes:  ?   Extraocular Movements: Extraocular movements intact.  ?Cardiovascular:  ?   Rate and Rhythm: Normal rate.  ?   Pulses: Normal pulses.  ?Pulmonary:  ?   Effort: Pulmonary effort is normal. No respiratory distress.  ?Abdominal:  ?   General: There is no distension.  ?   Palpations: Abdomen is soft.  ?Musculoskeletal:     ?   General: No tenderness or signs of injury.  ?   Cervical back: Neck supple.  ?   Right lower leg: No edema.  ?   Left lower leg: No edema.  ?   Comments: Patient has good distal strength without clonus.  ?Skin: ?    Findings: No erythema or rash.  ?Neurological:  ?   General: No focal deficit present.  ?   Mental Status: He is alert and oriented to person, place, and time.  ?   Sensory: No sensory deficit.  ?   Motor: No weakness or abnormal muscle tone.  ?   Coordination: Coordination normal.  ?Psychiatric:     ?   Mood and Affect: Mood normal.     ?   Behavior: Behavior normal.  ?  ? ?Imaging: ?No results found. ?

## 2021-11-30 ENCOUNTER — Ambulatory Visit: Payer: Medicare Other | Admitting: Physical Medicine and Rehabilitation

## 2021-11-30 ENCOUNTER — Other Ambulatory Visit: Payer: Self-pay

## 2021-11-30 ENCOUNTER — Encounter: Payer: Self-pay | Admitting: Physical Medicine and Rehabilitation

## 2021-11-30 VITALS — BP 125/78 | HR 90

## 2021-11-30 DIAGNOSIS — M47816 Spondylosis without myelopathy or radiculopathy, lumbar region: Secondary | ICD-10-CM | POA: Diagnosis not present

## 2021-11-30 DIAGNOSIS — G8929 Other chronic pain: Secondary | ICD-10-CM

## 2021-11-30 DIAGNOSIS — M7918 Myalgia, other site: Secondary | ICD-10-CM

## 2021-11-30 DIAGNOSIS — M545 Low back pain, unspecified: Secondary | ICD-10-CM | POA: Diagnosis not present

## 2021-11-30 NOTE — Progress Notes (Signed)
? ?Jared Griffin - 61 y.o. male MRN [id number]  Date of birth: [birth year] ? ?Office Visit Note: ?Visit Date: 11/30/2021 ?PCP: [name], MD ?Referred by: [name], MD ? ?Subjective: ?Chief Complaint  ?Patient presents with  ? Neck - Pain  ? Head - Pain  ? Left Shoulder - Pain  ? Lower Back - Pain  ? ?HPI: Jared Griffin is a 61 y.o. male who comes in today for evaluation of chronic bilateral lower back pain, right greater than left. Patient reports pain has been ongoing for several years.  Patient reports pain is exacerbated by movement and activity, describes as dull and sore sensation, currently rates as 2 out of 10.  Patient reports continued relief of pain with conservative therapies at home such as exercise regimen, rest and use of medications.  Patient continues to take Gabapentin and Robaxin as needed at night for muscle spasms.  Patient states he has attended formal physical therapy in the past at [facility] and reports some relief with these treatments.  Patient is currently undergoing formal chiropractic therapy with Dr. Sandi Carne at [facility] and reports some relief of pain with these treatments. Patient's lumbar MRI from 2021 exhibits transitional lumbosacral anatomy and scattered facet arthrosis most significant at L3-L4.  Patient had right L3-L4 interlaminar epidural steroid injection in our office on 11/16/2021 and reports greater than 60% pain relief with injection.  No high-grade spinal canal stenosis noted.  ? ?Incidentally, patient did mention chronic issues with musculoskeletal pain to entire back. Pain is exacerbated by movement and activity, describes as a constant sore and tender sensation, currently rates as 7 out of 10.  Patient reports some relief of pain with physical therapy, chiropractic treatments, home exercise regimen and use of medications.  Patient states he would like to discuss regrouping with physical therapy and would also like to  discuss further treatment options. ? ?Patient denies focal weakness, numbness and tingling.  Patient denies recent trauma or falls. ? ?Review of Systems  ?Musculoskeletal:  Positive for back pain and myalgias.  ?Neurological:  Negative for tingling, sensory change, focal weakness and weakness.  ?All other systems reviewed and are negative. Otherwise per HPI. ? ?Assessment & Plan: ?Visit Diagnoses:  ?  ICD-10-CM   ?1. Spondylosis without myelopathy or radiculopathy, lumbar region  M47.816 Ambulatory referral to Physical Therapy  ?  ?2. Chronic bilateral low back pain without sciatica  M54.50 Ambulatory referral to Physical Therapy  ? G89.29   ?  ?3. Facet hypertrophy of lumbar region  M47.816 Ambulatory referral to Physical Therapy  ?  ?4. Myofascial pain syndrome  M79.18 Ambulatory referral to Physical Therapy  ?  ?   ?Plan: Findings:  ?1.  Chronic bilateral lower back pain, right greater than left.  Significant and sustained relief of pain with recent right L3-L4 interlaminar epidural steroid injection.  His pain is now much more manageable at home at this point, however he does continue conservative therapies such as chiropractic treatments and home exercise regimen.  Patients clinical presentation and exam are complex, differentials could include discogenic vs mechanical pain.  We believe the next step is to continue to monitor, patient instructed to let us know if his pain worsens or changes in nature.  We will consider repeating lumbar epidural steroid injection if warranted. ? ?2.  Chronic diffuse pain to entire back.  Patient continues to have pain despite good conservative therapies such as formal physical therapy, chiropractic treatments  and home exercise regimen. Patients clinical presentation and exam are consistent with myofascial pain. Patient has not regrouped with physical therapy in several months and has not had dry needling treatments.  He does have multiple palpable trigger points to bilateral  trapezius, rhomboid and bilateral lumbar paraspinal regions upon exam. We believe the next step would be to re-group with our in house physical therapy team with a focus on manual treatment and dry needling. I also discussed massage therapy and provided him with information for [facility] and [facility]. I do feel deep tissue massage would be beneficial for patient.  ? ?Patient instructed to follow up with [place] as needed. No red flag symptoms noted upon exam today.   ? ?Meds & Orders: No orders of the defined types were placed in this encounter. ?  ?Orders Placed This Encounter  ?Procedures  ? Ambulatory referral to Physical Therapy  ?  ?Follow-up: Return if symptoms worsen or fail to improve.  ? ?Procedures: ?No procedures performed  ?   ? ?Clinical History: ?MRI LUMBAR SPINE WITHOUT CONTRAST ?  ?TECHNIQUE: ?Multiplanar, multisequence MR imaging of the lumbar spine was ?performed. No intravenous contrast was administered. ?  ?COMPARISON:  Plain films lumbar spine 11/10/2018. ?  ?FINDINGS: ?Segmentation: The patient has transitional lumbosacral anatomy. ?Numbering scheme on this study and is based on the lowest visible ?pair of ribs on the comparison plain films. Based on this numbering ?scheme, there is lumbarization of the first sacral segment on the ?right and a fully formed disc at the S1-2 level. ?  ?Alignment:  Maintained. ?  ?Vertebrae: No fracture, evidence of discitis or worrisome lesion. ?Hemangioma in L4 is noted. ?  ?Conus medullaris and cauda equina: Conus extends to the L2 level. ?Conus and cauda equina appear normal. ?  ?Paraspinal and other soft tissues: Negative. ?  ?Disc levels: ?  ?L1-2: Mild facet arthropathy.  Otherwise negative. ?  ?L2-3: Mild facet degenerative change.  Otherwise negative. ?  ?L2-3: Shallow disc bulge, ligamentum flavum thickening and mild to ?moderate facet degenerative disease. No stenosis. ?  ?L3-4: Minimal disc bulge.  No stenosis. ?  ?L5-S1: Minimal  disc bulge.  No stenosis. ?  ?S1-2: Transitional segment.  Negative. ?  ?IMPRESSION: ?Transitional lumbosacral anatomy. Please see numbering scheme above ?and correlate with plain films if any intervention is planned. ?  ?Mild lumbar degenerative change without central canal or foraminal ?narrowing. Scattered facet arthropathy is most notable at L3-4. ?  ?  ?Electronically Signed ?  By: Inge Rise M.D. ?  On: 10/10/2019 10:56  ? ?He reports that he quit smoking about 38 years ago. His smoking use included cigarettes. He started smoking about 48 years ago. He has a 5.00 pack-year smoking history. He quit smokeless tobacco use about 38 years ago.  His smokeless tobacco use included chew. No results for input(s): HGBA1C, LABURIC in the last 8760 hours. ? ?Objective:  VS:  HT:    WT:   BMI:     BP:125/78  HR:90bpm  TEMP: ( )  RESP:  ?Physical Exam ?Vitals and nursing note reviewed.  ?HENT:  ?   Head: Normocephalic and atraumatic.  ?   Right Ear: External ear normal.  ?   Left Ear: External ear normal.  ?   Nose: Nose normal.  ?   Mouth/Throat:  ?   Mouth: Mucous membranes are moist.  ?Eyes:  ?   Extraocular Movements: Extraocular movements intact.  ?Cardiovascular:  ?  Rate and Rhythm: Normal rate.  ?   Pulses: Normal pulses.  ?Pulmonary:  ?   Effort: Pulmonary effort is normal.  ?Abdominal:  ?   General: Abdomen is flat. There is no distension.  ?Musculoskeletal:     ?   General: Tenderness present.  ?   Cervical back: Normal range of motion.  ?   Comments: Pt rises from seated position to standing without difficulty. Good lumbar range of motion. Strong distal strength without clonus, no pain upon palpation of greater trochanters. Sensation intact bilaterally. Palpable trigger points noted to bilateral trapezius, rhomboid and bilateral lumbar paraspinal areas. Walks independently, gait steady.   ?Skin: ?   General: Skin is warm and dry.  ?   Capillary Refill: Capillary refill takes less than 2 seconds.   ?Neurological:  ?   General: No focal deficit present.  ?   Mental Status: He is alert and oriented to person, place, and time.  ?Psychiatric:     ?   Mood and Affect: Mood normal.     ?   Behavior: Behavior norma

## 2021-11-30 NOTE — Progress Notes (Signed)
Pt has hx of inj on 11/16/21 pt state it helped with 60% relief. Pt state he still has sum tenderness in his back. ? ?Pt state neck pain that travels to his top of his head and down to his left shoulder. Pt state he takes he takes over the counter pain meds to help ease his pain.  ? ?Numeric Pain Rating Scale and Functional Assessment ?Average Pain 10 ?Pain Right Now 2 ?My pain is intermittent, sharp, stabbing, and aching ?Pain is worse with: sitting, some activites, and laying down ?Pain improves with: rest, medication, and injections ? ? ?In the last MONTH (on 0-10 scale) has pain interfered with the following? ? ?1. General activity like being  able to carry out your everyday physical activities such as walking, climbing stairs, carrying groceries, or moving a chair?  ?Rating(5) ? ?2. Relation with others like being able to carry out your usual social activities and roles such as  activities at home, at work and in your community. ?Rating(6) ? ?3. Enjoyment of life such that you have  been bothered by emotional problems such as feeling anxious, depressed or irritable?  ?Rating(7) ? ?

## 2021-12-01 ENCOUNTER — Telehealth: Payer: Self-pay | Admitting: *Deleted

## 2021-12-01 NOTE — Chronic Care Management (AMB) (Signed)
?  Care Management  ? ?Note ? ?12/01/2021 ?Name: Jared Griffin MRN: [id number] DOB: 1961/01/07 ? ?Jared Griffin is a 61 y.o. year old male who is a primary care patient of Luking, Elayne Snare, MD and is actively engaged with the care management team. I reached out to [name] by phone today to assist with re-scheduling a follow up visit with the Pharmacist ? ?Follow up plan: ?Patient declines further follow up and engagement by the care management team. Appropriate care team members and provider have been notified via electronic communication.  The care management team is available to follow up with the patient after provider conversation with the patient regarding recommendation for care management engagement and subsequent re-referral to the care management team.  ? ?Laverda Sorenson  ?Care Guide, Embedded Care Coordination ?[facility]  Care Management  ?Direct Dial: [phone] ? ?

## 2021-12-04 ENCOUNTER — Other Ambulatory Visit: Payer: Self-pay

## 2021-12-04 ENCOUNTER — Telehealth (INDEPENDENT_AMBULATORY_CARE_PROVIDER_SITE_OTHER): Payer: Medicare Other | Admitting: Psychiatry

## 2021-12-04 ENCOUNTER — Encounter (HOSPITAL_COMMUNITY): Payer: Self-pay | Admitting: Psychiatry

## 2021-12-04 DIAGNOSIS — F321 Major depressive disorder, single episode, moderate: Secondary | ICD-10-CM

## 2021-12-04 MED ORDER — ALPRAZOLAM 1 MG PO TABS: 1.0000 mg | ORAL_TABLET | Freq: Three times a day (TID) | ORAL | 3 refills | Status: DC | PRN

## 2021-12-04 MED ORDER — SERTRALINE HCL 100 MG PO TABS: 150.0000 mg | ORAL_TABLET | Freq: Every day | ORAL | 3 refills | Status: DC

## 2021-12-04 NOTE — Progress Notes (Signed)
Virtual Visit via Telephone Note ? ?I connected with Jared Griffin on 12/04/21 at 10:20 AM EDT by telephone and verified that I am speaking with the correct person using two identifiers. ? ?Location: ?Patient: home ?Provider: office ?  ?I discussed the limitations, risks, security and privacy concerns of performing an evaluation and management service by telephone and the availability of in person appointments. I also discussed with the patient that there may be a patient responsible charge related to this service. The patient expressed understanding and agreed to proceed. ? ? ? ? ?  ?I discussed the assessment and treatment plan with the patient. The patient was provided an opportunity to ask questions and all were answered. The patient agreed with the plan and demonstrated an understanding of the instructions. ?  ?The patient was advised to call back or seek an in-person evaluation if the symptoms worsen or if the condition fails to improve as anticipated. ? ?I provided 15 minutes of non-face-to-face time during this encounter. ? ? ?[name], MD ? ?BH MD/PA/NP OP Progress Note ? ?12/04/2021 10:34 AM ?Jared Griffin  ?MRN:  [id number] ? ?Chief Complaint:  ?Chief Complaint  ?Patient presents with  ? Depression  ? Anxiety  ? Follow-up  ? ?HPI: This patient is a 61 year old married white male lives with his wife and  son in [place]. 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  Recently he has been working at [employer] zone but had an arm injury at work and had to quit there as well ? ?The patient returns for follow-up after 3 months.  He states he is doing much better.  He was dealing with irritable bowel syndrome but once he was taken off metformin most of his symptoms went away.  He is now on Januvia.  He is sleeping much better and his diabetes is under better control.  His energy is good.  He has been doing more odd jobs around [employer].  He denies  significant depression or anxiety and has not had to use the trazodone very often for sleep.  He denies thoughts of self-harm or suicidal ideation ?Visit Diagnosis:  ?  ICD-10-CM   ?1. Moderate single current episode of major depressive disorder ([place])  F32.1   ?  ? ? ?Past Psychiatric History: none ? ?Past Medical History:  ?Past Medical History:  ?Diagnosis Date  ? Anxiety   ? COPD (chronic obstructive pulmonary disease) ([place])   ? Coronary atherosclerosis of native coronary artery   ? a. 12/09/2013: s/p PCI with 3.0 x 28 Promus DES to mLAD  ? Depression   ? Diabetic neuropathy ([place])   ? Elbow injury 07/25/2017  ? Surgery for torn tendon  ? Essential hypertension   ? Fatty liver disease, nonalcoholic   ? GERD (gastroesophageal reflux disease)   ? Iron deficiency anemia 08/15/2021  ? Lateral epicondylitis of right elbow   ? Mixed hyperlipidemia   ? Obesity   ? Osgood-Schlatter's disease of right knee   ? Skin cyst 10/03/2017  ? Removed from back  ? Type 2 diabetes mellitus ([place])   ?  ?Past Surgical History:  ?Procedure Laterality Date  ? BIOPSY  01/21/2019  ? Procedure: BIOPSY;  Surgeon: [name], MD;  [facility];  Service: Endoscopy;;  gastric bx and gastric polyps  ? BIOPSY  11/08/2020  ? Procedure: BIOPSY;  Surgeon: [name], MD;  [facility];  Service: Gastroenterology;;  ? CARDIAC CATHETERIZATION  2011  ? CARDIAC CATHETERIZATION N/A 09/20/2015  ? Procedure: Left Heart Cath and Coronary Angiography;  Surgeon: [name], MD;  Location: [facility];  Service: Cardiovascular;  Laterality: N/A;  ? CHOLECYSTECTOMY  2003  ? COLONOSCOPY  06/19/2012  ? Procedure: COLONOSCOPY;  Surgeon: [name], MD;  [facility];  Service: Endoscopy;  Laterality: N/A;  930  ? COLONOSCOPY N/A 10/17/2017  ? Procedure: COLONOSCOPY;  Surgeon: [name], MD;  [facility];  Service: Endoscopy;  Laterality: N/A;  1225  ? COLONOSCOPY WITH PROPOFOL  N/A 11/08/2020  ? Procedure: COLONOSCOPY WITH PROPOFOL;  Surgeon: [name], MD;  [facility];  Service: Gastroenterology;  Laterality: N/A;  10:45  ? COLONOSCOPY WITH PROPOFOL N/A 04/04/2021  ? Procedure: COLONOSCOPY WITH PROPOFOL;  Surgeon: [name], MD;  [facility];  Service: Gastroenterology;  Laterality: N/A;  ? CYST EXCISION  07/2019  ? ESOPHAGOGASTRODUODENOSCOPY (EGD) WITH PROPOFOL N/A 01/21/2019  ? Procedure: ESOPHAGOGASTRODUODENOSCOPY (EGD) WITH PROPOFOL;  Surgeon: [name], MD;  [facility];  Service: Endoscopy;  Laterality: N/A;  10:15am  ? ESOPHAGOGASTRODUODENOSCOPY (EGD) WITH PROPOFOL N/A 02/17/2021  ? Procedure: ESOPHAGOGASTRODUODENOSCOPY (EGD) WITH PROPOFOL;  Surgeon: [name], MD;  [facility];  Service: Gastroenterology;  Laterality: N/A;  10:20  ? ESOPHAGOGASTRODUODENOSCOPY (EGD) WITH PROPOFOL N/A 04/04/2021  ? Procedure: ESOPHAGOGASTRODUODENOSCOPY (EGD) WITH PROPOFOL;  Surgeon: [name], MD;  [facility];  Service: Gastroenterology;  Laterality: N/A;  12:30  ? GIVENS CAPSULE STUDY N/A 09/13/2021  ? Procedure: GIVENS CAPSULE STUDY;  Surgeon: [name], MD;  [facility];  Service: Gastroenterology;  Laterality: N/A;  7:30  ? KNEE ARTHROPLASTY Right 1999  ? LEFT HEART CATH AND CORONARY ANGIOGRAPHY N/A 02/26/2018  ? Procedure: LEFT HEART CATH AND CORONARY ANGIOGRAPHY;  Surgeon: [name], MD;  Location: [facility];  Service: Cardiovascular;  Laterality: N/A;  ? LEFT HEART CATHETERIZATION WITH CORONARY ANGIOGRAM N/A 12/09/2013  ? Procedure: LEFT HEART CATHETERIZATION WITH CORONARY ANGIOGRAM;  Surgeon: [name], MD;  Location: [facility] C[facility] LAB;  Service: Cardiovascular;  Laterality: N/A;  ? Liver biopsy    ? POLYPECTOMY  10/17/2017  ? Procedure: POLYPECTOMY;  Surgeon: [name], MD;  [facility];  Service:  Endoscopy;;  ascending colon, splenic flexure,sigmoid x2  ? POLYPECTOMY  11/08/2020  ? Procedure: POLYPECTOMY;  Surgeon: [name], MD;  [facility];  Service: Gastroenterology;;  ? POLYPECTOMY  04/04/2021  ? Procedure: POLYPECTOMY;  Surgeon: [name], MD;  [facility];  Service: Gastroenterology;;  ? TENNIS ELBOW RELEASE/NIRSCHEL PROCEDURE Right 07/25/2017  ? Procedure: TENNIS ELBOW RELEASE and debriedment;  Surgeon: [name], MD;  Location: [facility];  Service: Orthopedics;  Laterality: Right;  ? TONSILLECTOMY  1970's  ? ? ?Family Psychiatric History: see below ? ?Family History:  ?Family History  ?Problem Relation Age of Onset  ? Osteoporosis Mother   ? Cancer Maternal Aunt   ? CVA Maternal Grandmother   ? CVA Maternal Grandfather   ? Healthy Son   ? ? ?Social History:  ?Social History  ? ?Socioeconomic History  ? Marital status: Married  ?  Spouse name: [name]  ? Number of children: 1  ? Years of education: Not on file  ? Highest education level: Not on file  ?Occupational  History  ? Not on file  ?Tobacco Use  ? Smoking status: Former  ?  Packs/day: 0.50  ?  Years: 10.00  ?  Pack years: 5.00  ?  Types: Cigarettes  ?  Start date: 02/02/1973  ?  Quit date: 09/17/1983  ?  Years since quitting: 38.2  ? Smokeless tobacco: Former  ?  Types: Chew  ?  Quit date: 09/17/1983  ?Vaping Use  ? Vaping Use: Never used  ?Substance and Sexual Activity  ? Alcohol use: No  ?  Alcohol/week: 0.0 standard drinks  ? Drug use: No  ? Sexual activity: Yes  ?Other Topics Concern  ? Not on file  ?Social History Narrative  ? 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   ? Married with 1 child-son  ? Right handed   ? 12 th   ? Very little caffeine  ? ?Social Determinants of Health  ? ?Financial Resource Strain: Low Risk   ? Difficulty of Paying Living Expenses: Not hard at all  ?Food Insecurity: No Food Insecurity  ? Worried About Paediatric nurse in the Last Year: Never true  ? Ran Out of Food in the Last Year: Never true  ?Transportation Needs: No Transportation Needs  ? Lack of Transportation (Medical): No  ? Lack of Transportation

## 2021-12-06 ENCOUNTER — Inpatient Hospital Stay (HOSPITAL_COMMUNITY): Payer: Medicare Other | Attending: Hematology

## 2021-12-06 DIAGNOSIS — D509 Iron deficiency anemia, unspecified: Secondary | ICD-10-CM | POA: Insufficient documentation

## 2021-12-06 LAB — CBC WITH DIFFERENTIAL/PLATELET
Abs Immature Granulocytes: 0.02 10*3/uL (ref 0.00–0.07)
Basophils Absolute: 0.1 10*3/uL (ref 0.0–0.1)
Basophils Relative: 1 %
Eosinophils Absolute: 0.2 10*3/uL (ref 0.0–0.5)
Eosinophils Relative: 3 %
HCT: 50.1 % (ref 39.0–52.0)
Hemoglobin: 16.8 g/dL (ref 13.0–17.0)
Immature Granulocytes: 0 %
Lymphocytes Relative: 30 %
Lymphs Abs: 2 10*3/uL (ref 0.7–4.0)
MCH: 29.8 pg (ref 26.0–34.0)
MCHC: 33.5 g/dL (ref 30.0–36.0)
MCV: 89 fL (ref 80.0–100.0)
Monocytes Absolute: 0.6 10*3/uL (ref 0.1–1.0)
Monocytes Relative: 9 %
Neutro Abs: 3.9 10*3/uL (ref 1.7–7.7)
Neutrophils Relative %: 57 %
Platelets: 150 10*3/uL (ref 150–400)
RBC: 5.63 MIL/uL (ref 4.22–5.81)
RDW: 13.5 % (ref 11.5–15.5)
WBC: 6.8 10*3/uL (ref 4.0–10.5)
nRBC: 0 % (ref 0.0–0.2)

## 2021-12-06 LAB — IRON AND TIBC
Iron: 182 ug/dL (ref 45–182)
Saturation Ratios: 35 % (ref 17.9–39.5)
TIBC: 524 ug/dL — ABNORMAL HIGH (ref 250–450)
UIBC: 342 ug/dL

## 2021-12-06 LAB — FERRITIN: Ferritin: 143 ng/mL (ref 24–336)

## 2021-12-09 ENCOUNTER — Other Ambulatory Visit: Payer: Self-pay | Admitting: Family Medicine

## 2021-12-11 ENCOUNTER — Ambulatory Visit (HOSPITAL_COMMUNITY): Payer: Medicare Other

## 2021-12-12 ENCOUNTER — Encounter (HOSPITAL_COMMUNITY): Payer: Self-pay | Admitting: *Deleted

## 2021-12-13 ENCOUNTER — Ambulatory Visit (HOSPITAL_COMMUNITY): Payer: Medicare Other | Admitting: Physician Assistant

## 2021-12-14 ENCOUNTER — Encounter (INDEPENDENT_AMBULATORY_CARE_PROVIDER_SITE_OTHER): Payer: Self-pay | Admitting: Gastroenterology

## 2021-12-14 ENCOUNTER — Ambulatory Visit (INDEPENDENT_AMBULATORY_CARE_PROVIDER_SITE_OTHER): Payer: Medicare Other | Admitting: Gastroenterology

## 2021-12-14 ENCOUNTER — Encounter (HOSPITAL_COMMUNITY): Payer: Self-pay | Admitting: Physical Therapy

## 2021-12-14 ENCOUNTER — Ambulatory Visit (HOSPITAL_COMMUNITY): Payer: Medicare Other | Attending: Physical Medicine and Rehabilitation | Admitting: Physical Therapy

## 2021-12-14 VITALS — BP 138/87 | HR 98 | Temp 98.6°F | Ht 74.0 in | Wt 266.4 lb

## 2021-12-14 DIAGNOSIS — R197 Diarrhea, unspecified: Secondary | ICD-10-CM

## 2021-12-14 DIAGNOSIS — M7918 Myalgia, other site: Secondary | ICD-10-CM | POA: Diagnosis not present

## 2021-12-14 DIAGNOSIS — R2689 Other abnormalities of gait and mobility: Secondary | ICD-10-CM | POA: Diagnosis present

## 2021-12-14 DIAGNOSIS — K580 Irritable bowel syndrome with diarrhea: Secondary | ICD-10-CM

## 2021-12-14 DIAGNOSIS — G8929 Other chronic pain: Secondary | ICD-10-CM | POA: Diagnosis not present

## 2021-12-14 DIAGNOSIS — M545 Low back pain, unspecified: Secondary | ICD-10-CM | POA: Diagnosis not present

## 2021-12-14 DIAGNOSIS — R29898 Other symptoms and signs involving the musculoskeletal system: Secondary | ICD-10-CM | POA: Diagnosis present

## 2021-12-14 DIAGNOSIS — M47816 Spondylosis without myelopathy or radiculopathy, lumbar region: Secondary | ICD-10-CM | POA: Diagnosis not present

## 2021-12-14 DIAGNOSIS — K7581 Nonalcoholic steatohepatitis (NASH): Secondary | ICD-10-CM | POA: Diagnosis not present

## 2021-12-14 NOTE — Therapy (Signed)
?OUTPATIENT PHYSICAL THERAPY THORACOLUMBAR EVALUATION ? ? ?Patient Name: Jared Griffin ?MRN: [id number] ?DOB:[birth year], 61 y.o., male ?Today's Date: 12/14/2021 ? ? PT End of Session - 12/14/21 0741   ? ? Visit Number 1   ? Number of Visits 12   ? Date for PT Re-Evaluation 01/25/22   ? Authorization Type [employer] Medicare (no auth, no VL)   ? Progress Note Due on Visit 10   ? PT Start Time 0745   ? PT Stop Time 0825   ? PT Time Calculation (min) 40 min   ? Activity Tolerance Patient tolerated treatment well   ? Behavior During Therapy [facility] for tasks assessed/performed   ? ?  ?  ? ?  ? ? ?Past Medical History:  ?Diagnosis Date  ? Anxiety   ? COPD (chronic obstructive pulmonary disease) ([place])   ? Coronary atherosclerosis of native coronary artery   ? a. 12/09/2013: s/p PCI with 3.0 x 28 Promus DES to mLAD  ? Depression   ? Diabetic neuropathy ([place])   ? Elbow injury 07/25/2017  ? Surgery for torn tendon  ? Essential hypertension   ? Fatty liver disease, nonalcoholic   ? GERD (gastroesophageal reflux disease)   ? Iron deficiency anemia 08/15/2021  ? Lateral epicondylitis of right elbow   ? Mixed hyperlipidemia   ? Obesity   ? Osgood-Schlatter's disease of right knee   ? Skin cyst 10/03/2017  ? Removed from back  ? Type 2 diabetes mellitus ([place])   ? ?Past Surgical History:  ?Procedure Laterality Date  ? BIOPSY  01/21/2019  ? Procedure: BIOPSY;  Surgeon: [name], MD;  [facility];  Service: Endoscopy;;  gastric bx and gastric polyps  ? BIOPSY  11/08/2020  ? Procedure: BIOPSY;  Surgeon: [name], MD;  [facility];  Service: Gastroenterology;;  ? CARDIAC CATHETERIZATION  2011  ? CARDIAC CATHETERIZATION N/A 09/20/2015  ? Procedure: Left Heart Cath and Coronary Angiography;  Surgeon: [name], MD;  Location: [place] [facility];  Service: Cardiovascular;  Laterality: N/A;  ? CHOLECYSTECTOMY  2003  ? COLONOSCOPY  06/19/2012  ? Procedure: COLONOSCOPY;  Surgeon: [name],  MD;  [facility];  Service: Endoscopy;  Laterality: N/A;  930  ? COLONOSCOPY N/A 10/17/2017  ? Procedure: COLONOSCOPY;  Surgeon: [name], MD;  [facility];  Service: Endoscopy;  Laterality: N/A;  1225  ? COLONOSCOPY WITH PROPOFOL N/A 11/08/2020  ? Procedure: COLONOSCOPY WITH PROPOFOL;  Surgeon: [name], MD;  [facility];  Service: Gastroenterology;  Laterality: N/A;  10:45  ? COLONOSCOPY WITH PROPOFOL N/A 04/04/2021  ? Procedure: COLONOSCOPY WITH PROPOFOL;  Surgeon: [name], MD;  [facility];  Service: Gastroenterology;  Laterality: N/A;  ? CYST EXCISION  07/2019  ? ESOPHAGOGASTRODUODENOSCOPY (EGD) WITH PROPOFOL N/A 01/21/2019  ? Procedure: ESOPHAGOGASTRODUODENOSCOPY (EGD) WITH PROPOFOL;  Surgeon: [name], MD;  [facility];  Service: Endoscopy;  Laterality: N/A;  10:15am  ? ESOPHAGOGASTRODUODENOSCOPY (EGD) WITH PROPOFOL N/A 02/17/2021  ? Procedure: ESOPHAGOGASTRODUODENOSCOPY (EGD) WITH PROPOFOL;  Surgeon: [name], MD;  [facility];  Service: Gastroenterology;  Laterality: N/A;  10:20  ? ESOPHAGOGASTRODUODENOSCOPY (EGD) WITH PROPOFOL N/A 04/04/2021  ? Procedure: ESOPHAGOGASTRODUODENOSCOPY (EGD) WITH PROPOFOL;  Surgeon: [name], MD;  [facility];  Service: Gastroenterology;  Laterality: N/A;  12:30  ? GIVENS CAPSULE STUDY N/A 09/13/2021  ? Procedure: GIVENS  CAPSULE STUDY;  Surgeon: Montez Morita, Quillian Quince, MD;  [facility];  Service: Gastroenterology;  Laterality: N/A;  7:30  ? KNEE ARTHROPLASTY Right 1999  ? LEFT HEART CATH AND CORONARY ANGIOGRAPHY N/A 02/26/2018  ? Procedure: LEFT HEART CATH AND CORONARY ANGIOGRAPHY;  Surgeon: [name], MD;  Location: [place] [facility];  Service: Cardiovascular;  Laterality: N/A;  ? LEFT HEART CATHETERIZATION WITH CORONARY ANGIOGRAM N/A 12/09/2013  ? Procedure: LEFT HEART CATHETERIZATION WITH  CORONARY ANGIOGRAM;  Surgeon: [name], MD;  Location: [facility];  Service: Cardiovascular;  Laterality: N/A;  ? Liver biopsy    ? POLYPECTOMY  10/17/2017  ? Procedure: POLYPECTOMY;  Surgeon: [name], MD;  [facility];  Service: Endoscopy;;  ascending colon, splenic flexure,sigmoid x2  ? POLYPECTOMY  11/08/2020  ? Procedure: POLYPECTOMY;  Surgeon: [name], MD;  [facility];  Service: Gastroenterology;;  ? POLYPECTOMY  04/04/2021  ? Procedure: POLYPECTOMY;  Surgeon: [name], MD;  [facility];  Service: Gastroenterology;;  ? TENNIS ELBOW RELEASE/NIRSCHEL PROCEDURE Right 07/25/2017  ? Procedure: TENNIS ELBOW RELEASE and debriedment;  Surgeon: [name], MD;  [facility];  Service: Orthopedics;  Laterality: Right;  ? TONSILLECTOMY  1970's  ? ?Patient Active Problem List  ? Diagnosis Date Noted  ? Iron deficiency anemia 08/15/2021  ? Orthostatic hypotension 12/07/2020  ? IBS (irritable bowel syndrome) 11/17/2020  ? Chronic diarrhea 08/17/2020  ? NASH (nonalcoholic steatohepatitis) 08/17/2020  ? Elevated LFTs 08/17/2020  ? Tremor of both hands 05/06/2020  ? Bilateral occipital neuralgia 05/06/2020  ? Abdominal pain, chronic, epigastric 01/13/2019  ? Anxiety 01/04/2019  ? Radicular pain of left lower extremity 12/22/2018  ? Moderate persistent asthma 10/25/2018  ? Dizziness 07/12/2018  ? Nonintractable headache   ? Abnormal nuclear stress test   ? Aftercare following surgery 07/25/17 08/13/2017  ? Hx of colonic polyps 08/07/2017  ? Lateral epicondylitis of right elbow   ? Cervical nerve root impingement 12/09/2015  ? Generalized anxiety disorder 09/27/2015  ? Chest pain 09/20/2015  ? Pain in the chest   ? Depression 01/24/2015  ? Esophageal reflux 01/24/2015  ? Preoperative cardiovascular examination 06/28/2014  ? DM type 2 causing vascular disease ([place])   ? Mixed hyperlipidemia   ? Obesity   ? Intermediate coronary  syndrome ([place]) 12/09/2013  ? Lumbago 05/21/2012  ? Essential hypertension, benign 12/28/2009  ? CAD S/P LAD DES March 2015 12/28/2009  ? Hyperlipidemia LDL goal <70 11/25/2009  ? Accelerating angina ([place]) 11/25/2009  ? DM 11/24/2009  ? ABDOMINAL PAIN, HX OF 11/24/2009  ? ? ?PCP: [name], MD ? ?REFERRING PROVIDER: Lorine Bears, NP ? ?REFERRING DIAG: M47.816 (ICD-10-CM) - Spondylosis without myelopathy or radiculopathy, lumbar region M54.50,G89.29 (ICD-10-CM) - Chronic bilateral low back pain without sciatica M47.816 (ICD-10-CM) - Facet hypertrophy of lumbar region M79.18 (ICD-10-CM) - Myofascial pain syndrome  ? ?THERAPY DIAG:  ?Low back pain, unspecified back pain laterality, unspecified chronicity, unspecified whether sciatica present ? ?Other abnormalities of gait and mobility ? ?Other symptoms and signs involving the musculoskeletal system ? ?ONSET DATE: November - December 2022 ? ?SUBJECTIVE:                                                                                                                                                                                          ? ?  SUBJECTIVE STATEMENT: ?His back has been bothering him for several years but was getting worse about 4-5 months ago. Gradual progression of symptoms and it has been restricting what he can do on a daily bases. Had shot back in 2021 with relief, injection about a month ago without relief. Symptoms worse with bending, prolonged walking. Per referral "Eval and treat for diffuse myofascial pain to entire back. Focus on manual treatments and dry needling." ?PERTINENT HISTORY:  ?Chronic back pain ? ?PAIN:  ?Are you having pain? Yes: NPRS scale: 6/10 ?Pain location: low back ?Pain description: burning, sharp ?Aggravating factors: movement ?Relieving factors: none ? ? ?PRECAUTIONS: None ? ?WEIGHT BEARING RESTRICTIONS No ? ?FALLS:  ?Has patient fallen in last 6 months? No ? ?LIVING ENVIRONMENT: ?Lives with: lives with their  spouse ?Lives in: House/apartment ?Stairs: Yes: External: 12 steps; on right going up ?Has following equipment at home: None ? ?OCCUPATION: Retired ? ?PLOF: Independent ? ?PATIENT GOALS Get back back to normal ? ? ?O

## 2021-12-14 NOTE — Patient Instructions (Signed)
-   Continue colestipol 2 g qday ?- Continue Imodium as needed for diarrhea ?- Can try lactase enzymes if willing to try milk based products ?

## 2021-12-14 NOTE — Patient Instructions (Signed)
Access Code: [email or web address] ?URL: [email or web address] ?Date: 12/14/2021 ?Prepared by: Mitzi Hansen Malyn Aytes ? ?Exercises ?- Prone Press Up  - 3-5 x daily - 7 x weekly - 2 sets - 10 reps ?

## 2021-12-14 NOTE — Progress Notes (Signed)
Jared Griffin, M.D. ?Gastroenterology & Hepatology ?[facility] ?[address] ?[place], [place] [address] ? ?Primary Care Physician: ?[name], MD ?[place] [address] ? ?I will communicate my assessment and recommendations to the referring MD via EMR. ? ?Problems: ?NASH ?IBS-D ?History of E. Coli O157 - resolved ?Bydureon and metformin intolerance ? ?History of Present Illness: ?Jared Griffin is a 61 y.o. male with past medical history COPD, CAD s/p stent, diabetes c/b neuropathy , HLD, NASH and IBS-D,  who presents for follow up of abdominal pain, distention and diarrhea. ? ?The patient was last seen on 09/08/2019. At that time, the patient was advised to stop all milk based products and to start colestipol 2 g every day. Advised Imodium as needed for diarrhea.  He underwent capsule endoscopy on 09/05/2021 for evaluation of persistent symptoms was completely unremarkable. ? ?Patient reports that he saw his endocrinologist 2 months ago and had both metformin and Bydureon. After a week of stopping both, he states his symptoms were completely gone. He has an average 3 Bms per day, more solid in consistency now. Has some discomfort in his upper abdominal area but not severe. No bloating. He is only having a little of milk (half cup). The patient denies having any nausea, vomiting, fever, chills, hematochezia, melena, hematemesis, abdominal distention, abdominal pain, diarrhea, jaundice, pruritus or weight loss. ? ?He is taking colestid 2 g daily. Very infrequently he takes Imodium ? ?Last EGD: 04/04/2021, normal esophagus with healed pill esophageal ulcer, multiple gastric polyps, normal duodenum. ?Last Colonoscopies: ? 11/08/2020 which showed a total of 5 subcentimeter polyps in the colon, 15 mm polyp in the mid transverse colon which was resected in a piecemeal fashion, a 20 mm lipoma hemorrhoids.  ?Random colonic biopsies were  negative for microscopic colitis.  All the polyps were tubular adenomas. ?  ?Repeat colonoscopy in 04/04/2021  ?- Preparation of the colon was inadequate. ?- Hemorrhoids found on perianal exam. ?- The examined portion of the ileum was normal. ?- One 3 mm polyp at the ileocecal valve, removed with a cold snare. Complete resection. Polyp tissue not retrieved. ?- Medium-sized lipoma in the transverse colon. ?- The exam was otherwise normal throughout the examined colon. No presence of inflammation in the colon throughout. ?- Stool in the entire examined colon. ?- Non-bleeding external internal hemorrhoids. ?  ?Recommended repeat colonoscopy in 3 years - Needs 2 day prep ? ?Past Medical History: ?Past Medical History:  ?Diagnosis Date  ? Anxiety   ? COPD (chronic obstructive pulmonary disease) ([place])   ? Coronary atherosclerosis of native coronary artery   ? a. 12/09/2013: s/p PCI with 3.0 x 28 Promus DES to mLAD  ? Depression   ? Diabetic neuropathy ([place])   ? Elbow injury 07/25/2017  ? Surgery for torn tendon  ? Essential hypertension   ? Fatty liver disease, nonalcoholic   ? GERD (gastroesophageal reflux disease)   ? Iron deficiency anemia 08/15/2021  ? Lateral epicondylitis of right elbow   ? Mixed hyperlipidemia   ? Obesity   ? Osgood-Schlatter's disease of right knee   ? Skin cyst 10/03/2017  ? Removed from back  ? Type 2 diabetes mellitus ([place])   ? ? ?Past Surgical History: ?Past Surgical History:  ?Procedure Laterality Date  ? BIOPSY  01/21/2019  ? Procedure: BIOPSY;  Surgeon: [name], MD;  [facility];  Service: Endoscopy;;  gastric bx and gastric  polyps  ? BIOPSY  11/08/2020  ? Procedure: BIOPSY;  Surgeon: [name], MD;  [facility];  Service: Gastroenterology;;  ? CARDIAC CATHETERIZATION  2011  ? CARDIAC CATHETERIZATION N/A 09/20/2015  ? Procedure: Left Heart Cath and Coronary Angiography;  Surgeon: [name], MD;  Location: [place] [facility];  Service:  Cardiovascular;  Laterality: N/A;  ? CHOLECYSTECTOMY  2003  ? COLONOSCOPY  06/19/2012  ? Procedure: COLONOSCOPY;  Surgeon: [name], MD;  [facility];  Service: Endoscopy;  Laterality: N/A;  930  ? COLONOSCOPY N/A 10/17/2017  ? Procedure: COLONOSCOPY;  Surgeon: [name], MD;  [facility];  Service: Endoscopy;  Laterality: N/A;  1225  ? COLONOSCOPY WITH PROPOFOL N/A 11/08/2020  ? Procedure: COLONOSCOPY WITH PROPOFOL;  Surgeon: [name], MD;  [facility];  Service: Gastroenterology;  Laterality: N/A;  10:45  ? COLONOSCOPY WITH PROPOFOL N/A 04/04/2021  ? Procedure: COLONOSCOPY WITH PROPOFOL;  Surgeon: [name], MD;  [facility];  Service: Gastroenterology;  Laterality: N/A;  ? CYST EXCISION  07/2019  ? ESOPHAGOGASTRODUODENOSCOPY (EGD) WITH PROPOFOL N/A 01/21/2019  ? Procedure: ESOPHAGOGASTRODUODENOSCOPY (EGD) WITH PROPOFOL;  Surgeon: [name], MD;  [facility];  Service: Endoscopy;  Laterality: N/A;  10:15am  ? ESOPHAGOGASTRODUODENOSCOPY (EGD) WITH PROPOFOL N/A 02/17/2021  ? Procedure: ESOPHAGOGASTRODUODENOSCOPY (EGD) WITH PROPOFOL;  Surgeon: [name], MD;  [facility];  Service: Gastroenterology;  Laterality: N/A;  10:20  ? ESOPHAGOGASTRODUODENOSCOPY (EGD) WITH PROPOFOL N/A 04/04/2021  ? Procedure: ESOPHAGOGASTRODUODENOSCOPY (EGD) WITH PROPOFOL;  Surgeon: [name], MD;  [facility];  Service: Gastroenterology;  Laterality: N/A;  12:30  ? GIVENS CAPSULE STUDY N/A 09/13/2021  ? Procedure: GIVENS CAPSULE STUDY;  Surgeon: [name], MD;  [facility];  Service: Gastroenterology;  Laterality: N/A;  7:30  ? KNEE ARTHROPLASTY Right 1999  ? LEFT HEART CATH AND CORONARY ANGIOGRAPHY N/A 02/26/2018  ? Procedure: LEFT HEART CATH AND CORONARY ANGIOGRAPHY;  Surgeon: [name], MD;  Location: [place] [facility];  Service:  Cardiovascular;  Laterality: N/A;  ? LEFT HEART CATHETERIZATION WITH CORONARY ANGIOGRAM N/A 12/09/2013  ? Procedure: LEFT HEART CATHETERIZATION WITH CORONARY ANGIOGRAM;  Surgeon: [name], MD;  Location[facility];  Service: Cardiovascular;  Laterality: N/A;  ? Liver biopsy    ? POLYPECTOMY  10/17/2017  ? Procedure: POLYPECTOMY;  Surgeon: [name], MD;  [facility];  Service: Endoscopy;;  ascending colon, splenic flexure,sigmoid x2  ? POLYPECTOMY  11/08/2020  ? Procedure: POLYPECTOMY;  Surgeon: [name], MD;  [facility];  Service: Gastroenterology;;  ? POLYPECTOMY  04/04/2021  ? Procedure: POLYPECTOMY;  Surgeon: [name], MD;  [facility];  Service: Gastroenterology;;  ? TENNIS ELBOW RELEASE/NIRSCHEL PROCEDURE Right 07/25/2017  ? Procedure: TENNIS ELBOW RELEASE and debriedment;  Surgeon: [name], MD;  [facility];  Service: Orthopedics;  Laterality: Right;  ? TONSILLECTOMY  1970's  ? ? ?Family History: ?Family History  ?Problem Relation Age of Onset  ? Osteoporosis Mother   ? Cancer Maternal Aunt   ? CVA Maternal Grandmother   ? CVA Maternal Grandfather   ? Healthy Son   ? ? ?Social History: ?Social History  ? ?Tobacco Use  ?Smoking Status Former  ? Packs/day: 0.50  ? Years: 10.00  ? Pack years: 5.00  ? Types: Cigarettes  ?  Start date: 02/02/1973  ? Quit date: 09/17/1983  ? Years since quitting: 38.2  ?Smokeless Tobacco Former  ? Types: Chew  ? Quit date: 09/17/1983  ? ?Social History  ? ?Substance and Sexual Activity  ?Alcohol Use No  ? Alcohol/week: 0.0 standard drinks  ? ?Social History  ? ?Substance and Sexual Activity  ?Drug Use No  ? ? ?Allergies: ?Allergies  ?Allergen Reactions  ? Doxycycline   ?  Severe esophagitis  ? ? ?Medications: ?Current Outpatient Medications  ?Medication Sig Dispense Refill  ? acetaminophen (TYLENOL) 500 MG tablet Take 1,000 mg by mouth every 8 (eight) hours as needed for moderate  pain.    ? albuterol (VENTOLIN HFA) 108 (90 Base) MCG/ACT inhaler INHALE 2 PUFFS INTO LUNGS EVERY 6 HOURS AS NEEDED FOR WHEEZING AND SHORTNESS OF BREATH. (Patient taking differently: Inhale 2 puffs into the lungs eve

## 2021-12-17 ENCOUNTER — Encounter (HOSPITAL_COMMUNITY): Payer: Self-pay | Admitting: Emergency Medicine

## 2021-12-17 ENCOUNTER — Other Ambulatory Visit: Payer: Self-pay

## 2021-12-17 ENCOUNTER — Emergency Department (HOSPITAL_COMMUNITY)
Admission: EM | Admit: 2021-12-17 | Discharge: 2021-12-17 | Disposition: A | Discharge status: Home / Self Care | Payer: Medicare Other | Attending: Emergency Medicine | Admitting: Emergency Medicine

## 2021-12-17 DIAGNOSIS — Z79899 Other long term (current) drug therapy: Secondary | ICD-10-CM | POA: Insufficient documentation

## 2021-12-17 DIAGNOSIS — R519 Headache, unspecified: Secondary | ICD-10-CM | POA: Diagnosis present

## 2021-12-17 DIAGNOSIS — Z7982 Long term (current) use of aspirin: Secondary | ICD-10-CM | POA: Diagnosis not present

## 2021-12-17 DIAGNOSIS — Z7984 Long term (current) use of oral hypoglycemic drugs: Secondary | ICD-10-CM | POA: Insufficient documentation

## 2021-12-17 MED ORDER — SODIUM CHLORIDE 0.9 % IV BOLUS
1000.0000 mL | Freq: Once | INTRAVENOUS | Status: AC
  Administered 2021-12-17: 1000 mL via INTRAVENOUS

## 2021-12-17 MED ORDER — KETOROLAC TROMETHAMINE 30 MG/ML IJ SOLN
15.0000 mg | Freq: Once | INTRAMUSCULAR | Status: AC
  Administered 2021-12-17: 15 mg via INTRAVENOUS
  Filled 2021-12-17: qty 1

## 2021-12-17 MED ORDER — PROCHLORPERAZINE EDISYLATE 10 MG/2ML IJ SOLN
10.0000 mg | Freq: Once | INTRAMUSCULAR | Status: AC
  Administered 2021-12-17: 10 mg via INTRAVENOUS
  Filled 2021-12-17: qty 2

## 2021-12-17 MED ORDER — DEXAMETHASONE SODIUM PHOSPHATE 10 MG/ML IJ SOLN
10.0000 mg | Freq: Once | INTRAMUSCULAR | Status: AC
  Administered 2021-12-17: 10 mg via INTRAVENOUS
  Filled 2021-12-17: qty 1

## 2021-12-17 NOTE — Discharge Instructions (Signed)
As discussed, your evaluation today has been largely reassuring.  But, it is important that you monitor your condition carefully, and do not hesitate to return to the ED if you develop new, or concerning changes in your condition. ? ?Otherwise, please follow-up with your physician for appropriate ongoing care. ? ?

## 2021-12-17 NOTE — ED Provider Notes (Signed)
?[facility] ?Provider Note ? ? ?CSN: [id number] ?Arrival date & time: 12/17/21  0753 ? ?  ? ?History ? ?Chief Complaint  ?Patient presents with  ? Migraine  ? ? ?Jared Griffin is a 61 y.o. male. ? ?HPI ?Patient presents with concern of headache.  He has a history of same, sees a neurologist.  Since yesterday he has had pain, throughout the posterior head, neck, no new vision changes, weakness in any extremity.  No relief with Percocet or other OTC medication.  No fever, chills, nausea vomiting, or other complaints. ?  ? ?Home Medications ?Prior to Admission medications   ?Medication Sig Start Date End Date Taking? Authorizing Provider  ?acetaminophen (TYLENOL) 500 MG tablet Take 1,000 mg by mouth every 8 (eight) hours as needed for moderate pain.    [provider]  ?albuterol (VENTOLIN HFA) 108 (90 Base) MCG/ACT inhaler INHALE 2 PUFFS INTO LUNGS EVERY 6 HOURS AS NEEDED FOR WHEEZING AND SHORTNESS OF BREATH. ?Patient taking differently: Inhale 2 puffs into the lungs every 6 (six) hours as needed for shortness of breath or wheezing. INHALE 2 PUFFS INTO LUNGS EVERY 6 HOURS AS NEEDED FOR WHEEZING AND SHORTNESS OF BREATH. 08/10/19   Mikey Kirschner, MD  ?ALPRAZolam Duanne Moron) 1 MG tablet Take 1 tablet (1 mg total) by mouth 3 (three) times daily as needed for anxiety. 12/04/21 12/04/22  Cloria Spring, MD  ?aspirin 81 MG EC tablet Take 1 tablet (81 mg total) by mouth daily. 01/22/19   [name], MD  ?atorvastatin (LIPITOR) 40 MG tablet TAKE ONE TABLET (40MG TOTAL) BY MOUTH DAILY 05/08/21   Satira Sark, MD  ?B-D UF III MINI PEN NEEDLES 31G X 5 MM MISC USE UP TO 4 TIMES DAILY WITH INSULIN AS DIRECTED. 03/09/21   Cassandria Anger, MD  ?cetirizine (ZYRTEC) 10 MG tablet Take 1 tablet (10 mg total) by mouth daily. 09/13/19   Wurst, [place], PA-C  ?colestipol (COLESTID) 1 g tablet Take 2 tablets (2 g total) by mouth daily. Take 4 hours apart from other medication ?Patient taking  differently: Take 2 g by mouth daily. Take 4 hours apart from other medication prn 09/07/21   Montez Morita, Quillian Quince, MD  ?Continuous Blood Gluc Sensor (FREESTYLE LIBRE 14 DAY SENSOR) MISC Inject 1 each into the skin every 14 (fourteen) days. Use as directed. 12/24/19   Cassandria Anger, MD  ?dapagliflozin propanediol (FARXIGA) 5 MG TABS tablet Take 5 mg by mouth daily. 03/31/20   [provider]  ?Doxylamine-Pyridoxine 10-10 MG TBEC Take 1 each by mouth every 12 (twelve) hours as needed (nausea). 09/07/21   [name], MD  ?fluticasone Asencion Islam) 50 MCG/ACT nasal spray Place 2 sprays into both nostrils daily. ?Patient taking differently: Place 2 sprays into both nostrils daily as needed for allergies. 09/13/19   Wurst, [place], PA-C  ?fluticasone-salmeterol (ADVAIR HFA) 115-21 MCG/ACT inhaler INHALE 2 PUFFS INTO THE LUNGS TWICE DAILY 09/20/21   [name], MD  ?gabapentin (NEURONTIN) 300 MG capsule TAKE TWO CAPSULES BY MOUTH EVERY MORNINGAND TWO CAPSULES IN THE EVENING 12/11/21   Luking, Elayne Snare, MD  ?HUMALOG KWIKPEN 200 UNIT/ML KwikPen Inject 30-35 Units into the skin 3 (three) times daily. 10/18/20   [provider]  ?ipratropium (ATROVENT) 0.06 % nasal spray Place 2 sprays into both nostrils in the morning and at bedtime.    [provider]  ?JANUVIA 100 MG tablet Take 100 mg by mouth daily. 11/17/21  [provider]  ?Lancets MISC 1 each by Does not apply route 4 (four) times daily. 09/24/19   Cassandria Anger, MD  ?losartan (COZAAR) 25 MG tablet TAKE (1) TABLET BY MOUTH ONCE DAILY. ?Patient taking differently: Take 25 mg by mouth daily. 10/07/20   Satira Sark, MD  ?methocarbamol (ROBAXIN) 500 MG tablet Take 500 mg by mouth every 8 (eight) hours as needed for muscle spasms.    [provider]  ?nitroGLYCERIN (NITROSTAT) 0.4 MG SL tablet Place 1 tablet (0.4 mg total) under the tongue every 5 (five) minutes as needed. 07/28/21   Satira Sark, MD  ?Omega-3 Fatty Acids (FISH OIL) 1200 MG CAPS Take 2 capsules (2,400 mg total) by mouth 2 (two) times daily. 06/02/21   [name], MD  ?omeprazole (PRILOSEC) 40 MG capsule Take 1 capsule (40 mg total) by mouth daily. 04/04/21   [name], MD  ?ondansetron (ZOFRAN-ODT) 8 MG disintegrating tablet TAKE ONE TABLET (8MG TOTAL) BY MOUTH EVERY 8 HOURS AS NEEDED FRO NAUSEA OR VOMITING 10/30/21   [name], MD  ?propranolol (INDERAL) 20 MG tablet TAKE ONE TABLET (20MG TOTAL) BY MOUTH TWO TIMES DAILY 11/01/21   Satira Sark, MD  ?ranolazine (RANEXA) 1000 MG SR tablet TAKE ONE TABLET (1000MG TOTAL) BY MOUTH TWO TIMES DAILY 08/23/21   Satira Sark, MD  ?sertraline (ZOLOFT) 100 MG tablet Take 1.5 tablets (150 mg total) by mouth daily. 12/04/21   Cloria Spring, MD  ?Nelva Nay MAX SOLOSTAR 300 UNIT/ML Solostar Pen INJECT 130 UNITS INTO THE SKIN AT BEDTIME. ?Patient taking differently: Inject 40-50 Units into the skin at bedtime. 04/11/20   Cassandria Anger, MD  ?traZODone (DESYREL) 50 MG tablet Take 1 tablet (50 mg total) by mouth at bedtime as needed. 04/28/21   Cloria Spring, MD  ?   ? ?Allergies    ?Doxycycline   ? ?Review of Systems   ?Review of Systems  ?Constitutional:   ?     Per HPI, otherwise negative  ?HENT:    ?     Per HPI, otherwise negative  ?Respiratory:    ?     Per HPI, otherwise negative  ?Cardiovascular:   ?     Per HPI, otherwise negative  ?Gastrointestinal:  Negative for vomiting.  ?Endocrine:  ?     Negative aside from HPI  ?Genitourinary:   ?     Neg aside from HPI   ?Musculoskeletal:   ?     Per HPI, otherwise negative  ?Skin: Negative.   ?Neurological:  Positive for headaches. Negative for syncope.  ? ?Physical Exam ?Updated Vital Signs ?BP (!) 167/93 (BP Location: Right Arm)   Pulse 63   Temp 98.4 ?F (36.9 ?C) (Oral)   Resp 19   Ht 6' 2"  (1.88 m)   Wt 120.2 kg   SpO2 96%   BMI 34.02 kg/m?  ?Physical Exam ?Vitals and nursing note reviewed.   ?Constitutional:   ?   General: He is not in acute distress. ?   Appearance: He is well-developed.  ?HENT:  ?   Head: Normocephalic and atraumatic.  ?Eyes:  ?   Conjunctiva/sclera: Conjunctivae normal.  ?Pulmonary:  ?   Effort: Pulmonary effort is normal. No respiratory distress.  ?   Breath sounds: No stridor.  ?Abdominal:  ?   General: There is no distension.  ?Skin: ?   General: Skin is warm and dry.  ?Neurological:  ?  Mental Status: He is alert and oriented to person, place, and time.  ?   Cranial Nerves: Cranial nerves 2-12 are intact.  ?   Sensory: Sensation is intact.  ?   Motor: Motor function is intact.  ?   Coordination: Coordination is intact.  ? ? ?ED Results / Procedures / Treatments   ?Labs ?(all labs ordered are listed, but only abnormal results are displayed) ?Labs Reviewed - No data to display ? ?EKG ?None ? ?Radiology ?No results found. ? ?Procedures ?Procedures  ? ? ?Medications Ordered in ED ?Medications  ?sodium chloride 0.9 % bolus 1,000 mL (has no administration in time range)  ?ketorolac (TORADOL) 30 MG/ML injection 15 mg (has no administration in time range)  ?prochlorperazine (COMPAZINE) injection 10 mg (has no administration in time range)  ?dexamethasone (DECADRON) injection 10 mg (has no administration in time range)  ? ? ?ED Course/ Medical Decision Making/ A&P ?This patient with a Hx of headaches, chronic care management presents to the ED for concern of severe headache not improved with narcotics or OTC medication, this involves an extensive number of treatment options, and is a complaint that carries with it a high risk of complications and morbidity.   ? ?The differential diagnosis includes acute on chronic head pain, less likely though a consideration include new CNS dysfunction, infection ? ? ?Social Determinants of Health: ? ?Chronic pain ? ?Additional history obtained: ? ?Additional history and/or information obtained from chart, notable for ongoing outpatient evaluation for  IBS, as well as chronic care management for pain ? ? ?After the initial evaluation, orders, including: IV fluids, Compazine, Toradol, Decadron, monitoring were initiated. ? ? ?Patient placed on Cardiac and Pulse-Oximetry Mo

## 2021-12-17 NOTE — ED Triage Notes (Signed)
Patient c/o severe headache that started yesterday around 1500. Per patient has took oxycodone x1, tylenol x1, and tramadol x1 with no relief. Per patient last had the tramadol at 3am. Patient reports photosensitivity and nausea without vomiting. Patient states he also took zofran at [facility]. Per patient blood pressure was 163/106 this morning and he has not taken his HTN medication. Denies any blurred vision, dizziness, slurred speech, or weakness. No neurological deficit noted. Per patient has been treated in past for migraine headaches. Patient goes to neurologist for neck "issues" and neurologist is supposed to be assessing him for his headaches soon. Patient states pain started in sinus region and is now mostly at base of neck.  ?

## 2021-12-19 NOTE — Progress Notes (Signed)
? ?Virtual Visit via Telephone Note ?Jared Griffin ? ?I connected with Jared Griffin  on 12/20/21 at  8:35 AM by telephone and verified that I am speaking with the correct person using two identifiers. ? ?Location: ?Patient: Home ?Provider: [facility] ?  ?I discussed the limitations, risks, security and privacy concerns of performing an evaluation and management service by telephone and the availability of in person appointments. I also discussed with the patient that there may be a patient responsible charge related to this service. The patient expressed understanding and agreed to proceed. ? ? ?HISTORY OF PRESENT ILLNESS: ?[name] (61 year old male) follows at our clinic for iron deficiency anemia.  He was last seen by Jared Abernethy PA-C on 08/15/2021.  He received IV iron on 08/24/2021 and 08/31/2021.  He had improved energy, decreased restless leg symptoms, and resolved ice pica after his IV iron.  He has had some recurrent fatigue over the last few weeks related to back issues, reports that he is starting physical therapy tomorrow.  He has not had any nosebleeds in the past few months.  He denies any melena or hematochezia.  He continues to follow with Dr. Domenic Griffin for chronic angina.  He has some chronic shortness of breath on exertion.  Occasional lightheadedness.  No syncopal episodes. ?He has 70% energy and 100% appetite.  He reports that he is maintaining a stable weight. ? ?  ?OBSERVATIONS/OBJECTIVE: ?Review of Systems  ?Constitutional:  Negative for chills, diaphoresis, fever, malaise/fatigue and weight loss.  ?Respiratory:  Positive for shortness of breath (with exertion). Negative for cough.   ?Cardiovascular:  Negative for chest pain and palpitations.  ?Gastrointestinal:  Negative for abdominal pain, blood in stool, melena, nausea and vomiting.  ?Musculoskeletal:  Positive for back pain.  ?Neurological:  Positive for headaches (migraines). Negative for  dizziness.  ?Psychiatric/Behavioral:  The patient has insomnia.    ? ?PHYSICAL EXAM (per limitations of virtual telephone visit): The patient is alert and oriented x 3, exhibiting adequate mentation, good mood, and ability to speak in full sentences and execute sound judgement. ? ? ?ASSESSMENT & PLAN: ?1.  Iron deficiency anemia ?- Seen for initial consultation by Dr. Chryl Griffin on 06/16/2021 ?- EGD (04/04/2021): Normal esophagus, previous pill esophageal ulcer has healed; multiple gastric polyps, normal duodenum ?- Colonoscopy (04/04/2021): Inadequate prep, external and internal hemorrhoids, polyps, medium sized lipoma in transverse colon ?- No gross GI hemorrhage - denies bright red blood per rectum and melena   ?- Previously was having frequent nosebleeds, but these have improved ?- Takes daily iron pill without adverse side effects.  Also takes omeprazole daily. ?- Symptoms of fatigue, pica, RLS improved after IV iron ?- Initial labs (06/20/2021) show normal Hgb 13.7/MCV 83.5, but significant iron deficiency with ferritin 12, iron saturation 7%, and TIBC 549.  B12 and folate were normal. ?- Most recent labs (12/06/2021): Normal Hgb 16.8, ferritin improved to 143, iron saturation 35%, elevated TIBC 524 ?- Suspect iron deficiency from frequent epistaxis, compounded by malabsorption in the setting of chronic disease and PPI use. ?- PLAN: No indication for IV iron at this time ?- Repeat labs and RTC (phone visit) in 4 months. ? ? ?I discussed the assessment and treatment plan with the patient. The patient was provided an opportunity to ask questions and all were answered. The patient agreed with the plan and demonstrated an understanding of the instructions. ?  ?The patient was advised to call back or seek an  in-person evaluation if the symptoms worsen or if the condition fails to improve as anticipated. ? ?I provided 15 minutes of non-face-to-face time during this encounter. ? ? ?Jared Rush, PA-C ?12/20/2021 8:51  AM ?

## 2021-12-20 ENCOUNTER — Encounter (HOSPITAL_COMMUNITY): Payer: Medicare Other

## 2021-12-20 ENCOUNTER — Ambulatory Visit: Payer: Medicare Other | Admitting: Family Medicine

## 2021-12-20 ENCOUNTER — Inpatient Hospital Stay (HOSPITAL_COMMUNITY): Payer: Medicare Other | Attending: Physician Assistant | Admitting: Physician Assistant

## 2021-12-20 DIAGNOSIS — M545 Low back pain, unspecified: Secondary | ICD-10-CM

## 2021-12-20 DIAGNOSIS — R2689 Other abnormalities of gait and mobility: Secondary | ICD-10-CM

## 2021-12-20 DIAGNOSIS — D509 Iron deficiency anemia, unspecified: Secondary | ICD-10-CM | POA: Diagnosis not present

## 2021-12-20 DIAGNOSIS — R29898 Other symptoms and signs involving the musculoskeletal system: Secondary | ICD-10-CM

## 2021-12-21 ENCOUNTER — Encounter (HOSPITAL_COMMUNITY): Payer: Self-pay | Admitting: Physical Therapy

## 2021-12-21 ENCOUNTER — Ambulatory Visit (HOSPITAL_COMMUNITY): Payer: Medicare Other | Attending: Physical Medicine and Rehabilitation | Admitting: Physical Therapy

## 2021-12-21 DIAGNOSIS — M6281 Muscle weakness (generalized): Secondary | ICD-10-CM | POA: Diagnosis present

## 2021-12-21 DIAGNOSIS — M5416 Radiculopathy, lumbar region: Secondary | ICD-10-CM | POA: Insufficient documentation

## 2021-12-21 DIAGNOSIS — R2689 Other abnormalities of gait and mobility: Secondary | ICD-10-CM | POA: Diagnosis present

## 2021-12-21 DIAGNOSIS — M542 Cervicalgia: Secondary | ICD-10-CM | POA: Insufficient documentation

## 2021-12-21 DIAGNOSIS — R29898 Other symptoms and signs involving the musculoskeletal system: Secondary | ICD-10-CM | POA: Diagnosis present

## 2021-12-21 DIAGNOSIS — M545 Low back pain, unspecified: Secondary | ICD-10-CM | POA: Insufficient documentation

## 2021-12-21 NOTE — Therapy (Signed)
?OUTPATIENT PHYSICAL THERAPY TREATMENT NOTE ? ? ?Patient Name: Jared Griffin ?MRN: [id number] ?DOB:May 30, 1961, 61 y.o., male ?Today's Date: 12/21/2021 ? ?PCP: [name], MD ?REFERRING PROVIDER: Lorine Bears, NP  ? ?END OF SESSION:  ? PT End of Session - 12/21/21 1116   ? ? Visit Number 2   ? Number of Visits 12   ? Date for PT Re-Evaluation 01/25/22   ? Authorization Type [employer] Medicare (no auth, no VL)   ? Progress Note Due on Visit 10   ? PT Start Time 1118   ? PT Stop Time 1200   ? PT Time Calculation (min) 42 min   ? Activity Tolerance Patient tolerated treatment well   ? Behavior During Therapy [facility] for tasks assessed/performed   ? ?  ?  ? ?  ? ? ?Past Medical History:  ?Diagnosis Date  ? Anxiety   ? COPD (chronic obstructive pulmonary disease) ([place])   ? Coronary atherosclerosis of native coronary artery   ? a. 12/09/2013: s/p PCI with 3.0 x 28 Promus DES to mLAD  ? Depression   ? Diabetic neuropathy ([place])   ? Elbow injury 07/25/2017  ? Surgery for torn tendon  ? Essential hypertension   ? Fatty liver disease, nonalcoholic   ? GERD (gastroesophageal reflux disease)   ? Iron deficiency anemia 08/15/2021  ? Lateral epicondylitis of right elbow   ? Mixed hyperlipidemia   ? Obesity   ? Osgood-Schlatter's disease of right knee   ? Skin cyst 10/03/2017  ? Removed from back  ? Type 2 diabetes mellitus ([place])   ? ?Past Surgical History:  ?Procedure Laterality Date  ? BIOPSY  01/21/2019  ? Procedure: BIOPSY;  Surgeon: [name], MD;  [facility];  Service: Endoscopy;;  gastric bx and gastric polyps  ? BIOPSY  11/08/2020  ? Procedure: BIOPSY;  Surgeon: [name], MD;  [facility];  Service: Gastroenterology;;  ? CARDIAC CATHETERIZATION  2011  ? CARDIAC CATHETERIZATION N/A 09/20/2015  ? Procedure: Left Heart Cath and Coronary Angiography;  Surgeon: [name], MD;  Location: [place] [facility];  Service: Cardiovascular;  Laterality: N/A;  ? CHOLECYSTECTOMY  2003   ? COLONOSCOPY  06/19/2012  ? Procedure: COLONOSCOPY;  Surgeon: [name], MD;  [facility];  Service: Endoscopy;  Laterality: N/A;  930  ? COLONOSCOPY N/A 10/17/2017  ? Procedure: COLONOSCOPY;  Surgeon: [name], MD;  [facility];  Service: Endoscopy;  Laterality: N/A;  1225  ? COLONOSCOPY WITH PROPOFOL N/A 11/08/2020  ? Procedure: COLONOSCOPY WITH PROPOFOL;  Surgeon: [name], MD;  [facility];  Service: Gastroenterology;  Laterality: N/A;  10:45  ? COLONOSCOPY WITH PROPOFOL N/A 04/04/2021  ? Procedure: COLONOSCOPY WITH PROPOFOL;  Surgeon: [name], MD;  [facility];  Service: Gastroenterology;  Laterality: N/A;  ? CYST EXCISION  07/2019  ? ESOPHAGOGASTRODUODENOSCOPY (EGD) WITH PROPOFOL N/A 01/21/2019  ? Procedure: ESOPHAGOGASTRODUODENOSCOPY (EGD) WITH PROPOFOL;  Surgeon: [name], MD;  [facility];  Service: Endoscopy;  Laterality: N/A;  10:15am  ? ESOPHAGOGASTRODUODENOSCOPY (EGD) WITH PROPOFOL N/A 02/17/2021  ? Procedure: ESOPHAGOGASTRODUODENOSCOPY (EGD) WITH PROPOFOL;  Surgeon: [name], MD;  [facility];  Service: Gastroenterology;  Laterality: N/A;  10:20  ? ESOPHAGOGASTRODUODENOSCOPY (EGD) WITH PROPOFOL N/A 04/04/2021  ? Procedure: ESOPHAGOGASTRODUODENOSCOPY (EGD) WITH PROPOFOL;  Surgeon: [name], MD;  [facility];  Service:  Gastroenterology;  Laterality: N/A;  12:30  ? GIVENS CAPSULE STUDY N/A 09/13/2021  ? Procedure: GIVENS CAPSULE STUDY;  Surgeon: [name], MD;  [facility];  Service: Gastroenterology;  Laterality: N/A;  7:30  ? KNEE ARTHROPLASTY Right 1999  ? LEFT HEART CATH AND CORONARY ANGIOGRAPHY N/A 02/26/2018  ? Procedure: LEFT HEART CATH AND CORONARY ANGIOGRAPHY;  Surgeon: [name], MD;  Location: [place] [facility];  Service: Cardiovascular;  Laterality: N/A;  ? LEFT HEART CATHETERIZATION WITH  CORONARY ANGIOGRAM N/A 12/09/2013  ? Procedure: LEFT HEART CATHETERIZATION WITH CORONARY ANGIOGRAM;  Surgeon: [name], MD;  Location: [facility];  Service: Cardiovascular;  Laterality: N/A;  ? Liver biopsy    ? POLYPECTOMY  10/17/2017  ? Procedure: POLYPECTOMY;  Surgeon: [name], MD;  [facility];  Service: Endoscopy;;  ascending colon, splenic flexure,sigmoid x2  ? POLYPECTOMY  11/08/2020  ? Procedure: POLYPECTOMY;  Surgeon: [name], MD;  [facility];  Service: Gastroenterology;;  ? POLYPECTOMY  04/04/2021  ? Procedure: POLYPECTOMY;  Surgeon: [name], MD;  [facility];  Service: Gastroenterology;;  ? TENNIS ELBOW RELEASE/NIRSCHEL PROCEDURE Right 07/25/2017  ? Procedure: TENNIS ELBOW RELEASE and debriedment;  Surgeon: [name], MD;  Location: [facility];  Service: Orthopedics;  Laterality: Right;  ? TONSILLECTOMY  1970's  ? ?Patient Active Problem List  ? Diagnosis Date Noted  ? Diarrhea 12/14/2021  ? Iron deficiency anemia 08/15/2021  ? Orthostatic hypotension 12/07/2020  ? IBS (irritable bowel syndrome) 11/17/2020  ? Chronic diarrhea 08/17/2020  ? NASH (nonalcoholic steatohepatitis) 08/17/2020  ? Elevated LFTs 08/17/2020  ? Tremor of both hands 05/06/2020  ? Bilateral occipital neuralgia 05/06/2020  ? Abdominal pain, chronic, epigastric 01/13/2019  ? Anxiety 01/04/2019  ? Radicular pain of left lower extremity 12/22/2018  ? Moderate persistent asthma 10/25/2018  ? Dizziness 07/12/2018  ? Nonintractable headache   ? Abnormal nuclear stress test   ? Aftercare following surgery 07/25/17 08/13/2017  ? Hx of colonic polyps 08/07/2017  ? Lateral epicondylitis of right elbow   ? Cervical nerve root impingement 12/09/2015  ? Generalized anxiety disorder 09/27/2015  ? Chest pain 09/20/2015  ? Pain in the chest   ? Depression 01/24/2015  ? Esophageal reflux 01/24/2015  ? Preoperative cardiovascular examination 06/28/2014  ? DM  type 2 causing vascular disease ([place])   ? Mixed hyperlipidemia   ? Obesity   ? Intermediate coronary syndrome ([place]) 12/09/2013  ? Lumbago 05/21/2012  ? Essential hypertension, benign 12/28/2009  ? CAD S/P LAD DES March 2015 12/28/2009  ? Hyperlipidemia LDL goal <70 11/25/2009  ? Accelerating angina ([place]) 11/25/2009  ? DM 11/24/2009  ? ABDOMINAL PAIN, HX OF 11/24/2009  ? ? ?REFERRING DIAG: M47.816 (ICD-10-CM) - Spondylosis without myelopathy or radiculopathy, lumbar region M54.50,G89.29 (ICD-10-CM) - Chronic bilateral low back pain without sciatica M47.816 (ICD-10-CM) - Facet hypertrophy of lumbar region M79.18 (ICD-10-CM) - Myofascial pain syndrome  ? ?THERAPY DIAG:  ?Low back pain, unspecified back pain laterality, unspecified chronicity, unspecified whether sciatica present ? ?Other abnormalities of gait and mobility ? ?PERTINENT HISTORY: Chronic back pain ? ?PRECAUTIONS: None ? ?SUBJECTIVE: Patient reports compliance with HEP. No benefit noted yet. Back is hurting today. He would like to try dry needling and "anything to help".  ? ?PAIN:  ?Are you having pain? Yes: NPRS scale: 7/10 ?Pain location: across low lumbar  ?Pain description: aching, sore ?Aggravating factors: "nothing in particular" ?Relieving factors:  Unsure  ? ?OBJECTIVE:  ?  ?PATIENT SURVEYS:  ?FOTO 44% ?  ?SCREENING FOR RED FLAGS: ?Bowel or bladder incontinence: No ?Spinal tumors: No ?Cauda equina syndrome: No ?Compression fracture: No ?Abdominal aneurysm: No ?  ?COGNITION: ?          Overall cognitive status: Within functional limits for tasks assessed               ?           ?SENSATION: ?WFL ?  ?  ?POSTURE:  ?Forward head ?  ?PALPATION: ?TTP lumbar and thoracic paraspinals bilaterally, hyperactive/taut, bilateral glute tenderness R>L; lumbar and thoracic CPA: grossly hypomobile ?  ?LUMBAR ROM:  ?  ?Active  A/PROM  ?12/14/2021  ?Flexion 50% limited*  ?Extension 50% limited*  ?Right lateral flexion 25% limited*  ?Left lateral flexion 25% limited*   ?Right rotation 25% limited  ?Left rotation 25% limited  ? (Blank rows = not tested) ?  ?LE ROM:  WFL for tasks assessed ?  ?Active  Right ?12/14/2021 Left ?12/14/2021  ?Hip flexion      ?Hip extension

## 2021-12-22 ENCOUNTER — Ambulatory Visit: Payer: Medicare Other | Admitting: Family Medicine

## 2021-12-25 ENCOUNTER — Telehealth: Payer: Self-pay | Admitting: Physical Medicine and Rehabilitation

## 2021-12-25 ENCOUNTER — Encounter: Payer: Self-pay | Admitting: Family Medicine

## 2021-12-25 ENCOUNTER — Ambulatory Visit (INDEPENDENT_AMBULATORY_CARE_PROVIDER_SITE_OTHER): Payer: Medicare Other | Admitting: Family Medicine

## 2021-12-25 VITALS — BP 133/86 | Ht 74.0 in | Wt 267.2 lb

## 2021-12-25 DIAGNOSIS — M542 Cervicalgia: Secondary | ICD-10-CM | POA: Diagnosis not present

## 2021-12-25 DIAGNOSIS — R519 Headache, unspecified: Secondary | ICD-10-CM | POA: Diagnosis not present

## 2021-12-25 DIAGNOSIS — I10 Essential (primary) hypertension: Secondary | ICD-10-CM | POA: Diagnosis not present

## 2021-12-25 MED ORDER — GABAPENTIN 300 MG PO CAPS: ORAL_CAPSULE | ORAL | 6 refills | Status: DC

## 2021-12-25 NOTE — Progress Notes (Signed)
12/25/21-referral to neurosurgery placed ?

## 2021-12-25 NOTE — Patient Instructions (Signed)

## 2021-12-25 NOTE — Progress Notes (Signed)
? ?  Subjective:  ? ? Patient ID: Jared Griffin, male    DOB: 07/03/1961, 61 y.o.   MRN: [id number] ? ?Hypertension ?This is a chronic problem. The current episode started more than 1 year ago. Risk factors for coronary artery disease include male gender and diabetes mellitus. Treatments tried: propranolol.  ? ?Patient would like to discuss migraine issues ?Patient is having severe headaches a originate in the back of his neck radiate up the back of his head.  Cause significant amount of pain and discomfort ?Has had previous troubles with his neck and was seen by Dr. Vertell Limber at 1 point. ?Patient is also seen by neurology on a periodic basis for tremors in his right arm ?Review of Systems ? ?   ?Objective:  ? Physical Exam ? ?General-in no acute distress ?Eyes-no discharge ?Lungs-respiratory rate normal, CTA ?CV-no murmurs,RRR ?Extremities skin warm dry no edema ?Neuro grossly normal ?Behavior normal, alert ? ? ? ?   ?Assessment & Plan:  ?Chronic headaches ?Having frequent neck pain ?Has seen Dr. Vertell Limber in the past ?He was scheduled to go there but then something came up ?Referral back to neurosurgery to have them evaluate his neck because it seems to be a central portion of his headaches ?If they feel that this is not a neurosurgical problem then neck step would be neurology to consider possibility of Botox injections ?Hold off on adding additional medicines currently ? ?Erectile dysfunction we will touch base with cardiology or pharmacist to see if it gets along with his Ranexa ? ?Follow-up by fall time ? ?

## 2021-12-25 NOTE — Telephone Encounter (Signed)
Pt is calling to advise that the injection did not work. He is in a lot of pain --Please call the patient  ?

## 2021-12-25 NOTE — Addendum Note (Signed)
Addended by: Vicente Males on: 12/25/2021 11:01 AM ? ? Modules accepted: Orders ? ?

## 2021-12-26 ENCOUNTER — Ambulatory Visit (HOSPITAL_COMMUNITY): Payer: Medicare Other

## 2021-12-26 DIAGNOSIS — R2689 Other abnormalities of gait and mobility: Secondary | ICD-10-CM

## 2021-12-26 DIAGNOSIS — M6281 Muscle weakness (generalized): Secondary | ICD-10-CM

## 2021-12-26 DIAGNOSIS — M545 Low back pain, unspecified: Secondary | ICD-10-CM

## 2021-12-26 DIAGNOSIS — R29898 Other symptoms and signs involving the musculoskeletal system: Secondary | ICD-10-CM

## 2021-12-26 DIAGNOSIS — M5416 Radiculopathy, lumbar region: Secondary | ICD-10-CM

## 2021-12-26 DIAGNOSIS — M542 Cervicalgia: Secondary | ICD-10-CM

## 2021-12-26 NOTE — Therapy (Signed)
OUTPATIENT PHYSICAL THERAPY TREATMENT NOTE   Patient Name: Jared Griffin MRN: [id number] DOB:21-Nov-1960, 61 y.o., male Today's Date: 12/26/2021  PCP: Babs Sciara, MD REFERRING PROVIDER: Juanda Chance, NP   END OF SESSION:   PT End of Session - 12/26/21 1345     Visit Number 3    Number of Visits 12    Date for PT Re-Evaluation 01/25/22    Authorization Type UHC Medicare (no auth, no VL)    Progress Note Due on Visit 10    PT Start Time 1345    PT Stop Time 1431    PT Time Calculation (min) 46 min    Activity Tolerance Patient tolerated treatment well    Behavior During Therapy WFL for tasks assessed/performed              Past Medical History:  Diagnosis Date   Anxiety    COPD (chronic obstructive pulmonary disease) (HCC)    Coronary atherosclerosis of native coronary artery    a. 12/09/2013: s/p PCI with 3.0 x 28 Promus DES to mLAD   Depression    Diabetic neuropathy (HCC)    Elbow injury 07/25/2017   Surgery for torn tendon   Essential hypertension    Fatty liver disease, nonalcoholic    GERD (gastroesophageal reflux disease)    Iron deficiency anemia 08/15/2021   Lateral epicondylitis of right elbow    Mixed hyperlipidemia    Obesity    Osgood-Schlatter's disease of right knee    Skin cyst 10/03/2017   Removed from back   Type 2 diabetes mellitus (HCC)    Past Surgical History:  Procedure Laterality Date   BIOPSY  01/21/2019   Procedure: BIOPSY;  Surgeon: Malissa Hippo, MD;  [facility];  Service: Endoscopy;;  gastric bx and gastric polyps   BIOPSY  11/08/2020   Procedure: BIOPSY;  Surgeon: Dolores Frame, MD;  [facility];  Service: Gastroenterology;;   CARDIAC CATHETERIZATION  2011   CARDIAC CATHETERIZATION N/A 09/20/2015   Procedure: Left Heart Cath and Coronary Angiography;  Surgeon: Kathleene Hazel, MD;  Location: [facility] INVASIVE CV LAB;  Service: Cardiovascular;  Laterality: N/A;   CHOLECYSTECTOMY   2003   COLONOSCOPY  06/19/2012   Procedure: COLONOSCOPY;  Surgeon: Malissa Hippo, MD;  [facility];  Service: Endoscopy;  Laterality: N/A;  930   COLONOSCOPY N/A 10/17/2017   Procedure: COLONOSCOPY;  Surgeon: Malissa Hippo, MD;  [facility];  Service: Endoscopy;  Laterality: N/A;  1225   COLONOSCOPY WITH PROPOFOL N/A 11/08/2020   Procedure: COLONOSCOPY WITH PROPOFOL;  Surgeon: Dolores Frame, MD;  [facility];  Service: Gastroenterology;  Laterality: N/A;  10:45   COLONOSCOPY WITH PROPOFOL N/A 04/04/2021   Procedure: COLONOSCOPY WITH PROPOFOL;  Surgeon: Dolores Frame, MD;  [facility];  Service: Gastroenterology;  Laterality: N/A;   CYST EXCISION  07/2019   ESOPHAGOGASTRODUODENOSCOPY (EGD) WITH PROPOFOL N/A 01/21/2019   Procedure: ESOPHAGOGASTRODUODENOSCOPY (EGD) WITH PROPOFOL;  Surgeon: Malissa Hippo, MD;  [facility];  Service: Endoscopy;  Laterality: N/A;  10:15am   ESOPHAGOGASTRODUODENOSCOPY (EGD) WITH PROPOFOL N/A 02/17/2021   Procedure: ESOPHAGOGASTRODUODENOSCOPY (EGD) WITH PROPOFOL;  Surgeon: Dolores Frame, MD;  [facility];  Service: Gastroenterology;  Laterality: N/A;  10:20   ESOPHAGOGASTRODUODENOSCOPY (EGD) WITH PROPOFOL N/A 04/04/2021   Procedure: ESOPHAGOGASTRODUODENOSCOPY (EGD) WITH PROPOFOL;  Surgeon: Dolores Frame, MD;  [facility];  Service: Gastroenterology;  Laterality: N/A;  12:30   GIVENS CAPSULE STUDY N/A 09/13/2021   Procedure: GIVENS CAPSULE STUDY;  Surgeon: Dolores Frame, MD;  [facility];  Service: Gastroenterology;  Laterality: N/A;  7:30   KNEE ARTHROPLASTY Right 1999   LEFT HEART CATH AND CORONARY ANGIOGRAPHY N/A 02/26/2018   Procedure: LEFT HEART CATH AND CORONARY ANGIOGRAPHY;  Surgeon: Corky Crafts, MD;  Location: [facility] INVASIVE CV LAB;  Service: Cardiovascular;  Laterality: N/A;   LEFT HEART CATHETERIZATION  WITH CORONARY ANGIOGRAM N/A 12/09/2013   Procedure: LEFT HEART CATHETERIZATION WITH CORONARY ANGIOGRAM;  Surgeon: Corky Crafts, MD;  Location: [facility] CATH LAB;  Service: Cardiovascular;  Laterality: N/A;   Liver biopsy     POLYPECTOMY  10/17/2017   Procedure: POLYPECTOMY;  Surgeon: Malissa Hippo, MD;  [facility];  Service: Endoscopy;;  ascending colon, splenic flexure,sigmoid x2   POLYPECTOMY  11/08/2020   Procedure: POLYPECTOMY;  Surgeon: Dolores Frame, MD;  [facility];  Service: Gastroenterology;;   POLYPECTOMY  04/04/2021   Procedure: POLYPECTOMY;  Surgeon: Dolores Frame, MD;  [facility];  Service: Gastroenterology;;   TENNIS ELBOW RELEASE/NIRSCHEL PROCEDURE Right 07/25/2017   Procedure: TENNIS ELBOW RELEASE and debriedment;  Surgeon: Vickki Hearing, MD;  [facility]: [facility];  Service: Orthopedics;  Laterality: Right;   TONSILLECTOMY  1970's   Patient Active Problem List   Diagnosis Date Noted   Diarrhea 12/14/2021   Iron deficiency anemia 08/15/2021   Orthostatic hypotension 12/07/2020   IBS (irritable bowel syndrome) 11/17/2020   Chronic diarrhea 08/17/2020   NASH (nonalcoholic steatohepatitis) 08/17/2020   Elevated LFTs 08/17/2020   Tremor of both hands 05/06/2020   Bilateral occipital neuralgia 05/06/2020   Abdominal pain, chronic, epigastric 01/13/2019   Anxiety 01/04/2019   Radicular pain of left lower extremity 12/22/2018   Moderate persistent asthma 10/25/2018   Dizziness 07/12/2018   Nonintractable headache    Abnormal nuclear stress test    Aftercare following surgery 07/25/17 08/13/2017   Hx of colonic polyps 08/07/2017   Lateral epicondylitis of right elbow    Cervical nerve root impingement 12/09/2015   Generalized anxiety disorder 09/27/2015   Chest pain 09/20/2015   Pain in the chest    Depression 01/24/2015   Esophageal reflux 01/24/2015   Preoperative cardiovascular examination 06/28/2014    DM type 2 causing vascular disease (HCC)    Mixed hyperlipidemia    Obesity    Intermediate coronary syndrome (HCC) 12/09/2013   Lumbago 05/21/2012   Essential hypertension, benign 12/28/2009   CAD S/P LAD DES March 2015 12/28/2009   Hyperlipidemia LDL goal <70 11/25/2009   Accelerating angina (HCC) 11/25/2009   DM 11/24/2009   ABDOMINAL PAIN, HX OF 11/24/2009    REFERRING DIAG: M47.816 (ICD-10-CM) - Spondylosis without myelopathy or radiculopathy, lumbar region M54.50,G89.29 (ICD-10-CM) - Chronic bilateral low back pain without sciatica M47.816 (ICD-10-CM) - Facet hypertrophy of lumbar region M79.18 (ICD-10-CM) - Myofascial pain syndrome   THERAPY DIAG:  Low back pain, unspecified back pain laterality, unspecified chronicity, unspecified whether sciatica present  Other abnormalities of gait and mobility  Muscle weakness (generalized)  Radiculopathy, lumbar region  Other symptoms and signs involving the musculoskeletal system  Cervicalgia  PERTINENT HISTORY: Chronic back pain  PRECAUTIONS: None  SUBJECTIVE: Patient unable to perform HEP due to increased pain.  He did not have any relief after dry needling although he reports he initially felt a little looser.  Feels "a lot" of  pressure from his low back up to his shoulder blades. Had his MD appointment moved up to next Thursday.   PAIN:  Are you having pain? Yes: NPRS scale: 8/10 Pain location: across low lumbar  Pain description: aching, sore Aggravating factors: "nothing in particular" Relieving factors: Unsure   OBJECTIVE:      POSTURE:  Forward head   PALPATION: TTP lumbar and thoracic paraspinals bilaterally, hyperactive/taut, bilateral glute tenderness R>L; lumbar and thoracic CPA: grossly hypomobile   LUMBAR ROM:    Active  A/PROM  12/14/2021 AROM 12/26/21  Flexion 50% limited* 50% limited*  Extension 50% limited* 50% limited *  Right lateral flexion 25% limited*   Left lateral flexion 25% limited*    Right rotation 25% limited   Left rotation 25% limited    (Blank rows = not tested)     LE MMT:   MMT Right 12/14/2021 Left 12/14/2021  Hip flexion 4+/5 4+/5  Hip extension 4/5 4/5  Hip abduction 4/5 4+/5  Hip adduction      Hip internal rotation      Hip external rotation      Knee flexion 5/5 5/5  Knee extension 5/5 5/5  Ankle dorsiflexion 5/5 5/5  Ankle plantarflexion      Ankle inversion      Ankle eversion       (Blank rows = not tested)   LUMBAR SPECIAL TESTS:  NT   FUNCTIONAL TESTS:  2 minute walk test: 400 feet   GAIT: Distance walked: 400 Assistive device utilized: None Level of assistance: Complete Independence Comments: , decreased trunk movement and arm swing bilaterally       TODAY'S TREATMENT  12/26/21 Re check lumbar AROM Flexion and extension limited by 50% and painful from "tailbone up to neck" Seated Thoracic extension x 10 reps Nustep seat 11, arms 11 x 5 min Scap retraction RTB 2 x 10 Shoulder extension RTB  2 x 10 Heel raises 2 x 10 Slant board 3 x 30" Hamstring stretch with strap 3 x 30" seated     12/21/21 DRY NEEDLING: Dry needling consent given? yes Educational handouts provided? yes Muscles treated: bilateral lumbar paraspinal  Response from dry needling: good tolerance   LTR 5 x 10"  Bridge 10 x 5" Ab marching x15   Manual STM to bilateral lumbar paraspinals pre and post dry needling for trigger point identification and surface area preparation     12/14/21 POE 2x 30 second holds Press up 2x 10      PATIENT EDUCATION:  Education details: on DN, exercise form and  Person educated: Patient Education method: Programmer, multimedia, Demonstration, and Handouts Education comprehension: verbalized understanding, returned demonstration, verbal cues required, and tactile cues required       HOME EXERCISE PROGRAM:  Exercises - Seated Thoracic Lumbar Extension with Pectoralis Stretch  - 1 x daily - 7 x weekly - 1 sets - 10  reps - Shoulder Extension with Resistance  - 1 x daily - 7 x weekly - 3 sets - 10 reps - Scapular Retraction with Resistance  - 1 x daily - 7 x weekly - 3 sets - 10 reps - Seated Hamstring Stretch with Strap  - 1 x daily - 7 x weekly - 1 sets - 3 reps - 30 sec hold - Slant Board Gastrocnemius Stretch  - 1 x daily - 7 x weekly - 1 sets - 3 reps - 30 sec hold   Exercises - Supine Bridge  - 2 x daily -  7 x weekly - 1-2 sets - 10 reps - 5 second hold - Supine March  - 2 x daily - 7 x weekly - 1-2 sets - 10 reps - Supine Lower Trunk Rotation  - 2 x daily - 7 x weekly - 1 sets - 10 reps - 5 second hold3/30/23 press up   ASSESSMENT:   CLINICAL IMPRESSION: Patient states non compliance with HEP due to pain so today we worked on just trying to get patient moving and incorporating some postural exercise and posterior chain stretching to improve mobility.  He was able to complete all new exercises without complaint of increased pain. Patient will continue to benefit from skilled therapy services to reduce remaining deficits and improve functional ability.      OBJECTIVE IMPAIRMENTS Abnormal gait, decreased activity tolerance, decreased endurance, decreased mobility, difficulty walking, decreased ROM, decreased strength, hypomobility, increased muscle spasms, impaired flexibility, improper body mechanics, postural dysfunction, and pain.    ACTIVITY LIMITATIONS cleaning, community activity, driving, meal prep, laundry, yard work, shopping, and yard work.    PERSONAL FACTORS Fitness, Past/current experiences, Time since onset of injury/illness/exacerbation, and 3+ comorbidities: chronic back pain, DM, increased BMI, cardiac   are also affecting patient's functional outcome.      REHAB POTENTIAL: Good   CLINICAL DECISION MAKING: Stable/uncomplicated   EVALUATION COMPLEXITY: Low     GOALS: Goals reviewed with patient? Yes   SHORT TERM GOALS: Target date: 01/04/2022   Patient will be independent  with HEP in order to improve functional outcomes. Baseline:  Goal status: INITIAL   2.  Patient will report at least 25% improvement in symptoms for improved quality of life. Baseline:  Goal status: INITIAL       LONG TERM GOALS: Target date: 01/25/2022   Patient will report at least 75% improvement in symptoms for improved quality of life. Baseline:  Goal status: INITIAL   2.  Patient will improve FOTO score by at least 10 points in order to indicate improved tolerance to activity. Baseline:  Goal status: INITIAL   3.  Patient will demonstrate at least 25% improvement in lumbar ROM in all restricted planes for improved ability to move trunk while completing chores. Baseline:  Goal status: INITIAL   4.  Patient will be able to ambulate at least 450 feet in with pain no greater than 2/10 in order to demonstrate improved tolerance to activity. Baseline:  Goal status: INITIAL       PLAN: PT FREQUENCY: 2x/week   PT DURATION: 6 weeks   PLANNED INTERVENTIONS: PLANNED INTERVENTIONS: Therapeutic exercises, Therapeutic activity, Neuromuscular re-education, Balance training, Gait training, Patient/Family education, Joint manipulation, Joint mobilization, Stair training, Orthotic/Fit training, DME instructions, Aquatic Therapy, Dry Needling, Electrical stimulation, Spinal manipulation, Spinal mobilization, Cryotherapy, Moist heat, Compression bandaging, scar mobilization, Splintting, Taping, Traction, Ultrasound, Ionotophoresis 4mg /ml Dexamethasone, and Manual therapy     PLAN FOR NEXT SESSION: Manual for mobility/pain, f/u with HEP/extension exercise, core and glute strength, postural strength     2:36 PM, 12/26/21 Landis Dowdy Small Kaedin Hicklin MPT [facility] [place] [phone] [phone]

## 2021-12-28 ENCOUNTER — Encounter (HOSPITAL_COMMUNITY): Payer: Medicare Other | Admitting: Physical Therapy

## 2021-12-31 ENCOUNTER — Telehealth: Payer: Self-pay | Admitting: Family Medicine

## 2021-12-31 NOTE — Telephone Encounter (Signed)
Nurses ?Please let patient know that we did discuss with cardiology ?They did state that Viagra can be used with Ranexa. ? ? ?Cannot be used with sublingual nitroglycerin ?If he is interested with me sending in a Viagra prescription I can do so ?An alternative is I can print a prescription for Viagra so that he could shop around for the best price ? ?Cost wise [facility] would more than likely offer the most reasonable price ?Viagra generally is not covered by insurance ?If he is interested I recommend the following ?Viagra 100 mg, directions-1/2 tablet or 1 whole tablet 60 minutes before relations, #10 with 5 refills ? ?Common side effects was discussed with him but if he needs further information let me know I can send him a MyChart message thank you or call him. ? ?

## 2022-01-01 ENCOUNTER — Encounter (HOSPITAL_COMMUNITY): Payer: Self-pay | Admitting: Physical Therapy

## 2022-01-01 ENCOUNTER — Encounter: Payer: Self-pay | Admitting: *Deleted

## 2022-01-01 ENCOUNTER — Ambulatory Visit (HOSPITAL_COMMUNITY): Payer: Medicare Other | Admitting: Physical Therapy

## 2022-01-01 DIAGNOSIS — M545 Low back pain, unspecified: Secondary | ICD-10-CM | POA: Diagnosis not present

## 2022-01-01 DIAGNOSIS — M5416 Radiculopathy, lumbar region: Secondary | ICD-10-CM

## 2022-01-01 NOTE — Therapy (Signed)
?OUTPATIENT PHYSICAL THERAPY TREATMENT NOTE ? ? ?Patient Name: Jared Griffin ?MRN: [id number] ?DOB:April 03, 1961, 61 y.o., male ?Today's Date: 01/01/2022 ? ?PCP: [name], MD ?REFERRING PROVIDER: Lorine Bears, NP  ? ?END OF SESSION:  ? PT End of Session - 01/01/22 1431   ? ? Visit Number 4   ? Number of Visits 12   ? Date for PT Re-Evaluation 01/25/22   ? Authorization Type UHC Medicare (no auth, no VL)   ? Progress Note Due on Visit 10   ? PT Start Time 1435   ? PT Stop Time 1515   ? PT Time Calculation (min) 40 min   ? Activity Tolerance Patient tolerated treatment well   ? Behavior During Therapy [facility] for tasks assessed/performed   ? ?  ?  ? ?  ? ? ? ?Past Medical History:  ?Diagnosis Date  ? Anxiety   ? COPD (chronic obstructive pulmonary disease) ([place])   ? Coronary atherosclerosis of native coronary artery   ? a. 12/09/2013: s/p PCI with 3.0 x 28 Promus DES to mLAD  ? Depression   ? Diabetic neuropathy ([place])   ? Elbow injury 07/25/2017  ? Surgery for torn tendon  ? Essential hypertension   ? Fatty liver disease, nonalcoholic   ? GERD (gastroesophageal reflux disease)   ? Iron deficiency anemia 08/15/2021  ? Lateral epicondylitis of right elbow   ? Mixed hyperlipidemia   ? Obesity   ? Osgood-Schlatter's disease of right knee   ? Skin cyst 10/03/2017  ? Removed from back  ? Type 2 diabetes mellitus ([place])   ? ?Past Surgical History:  ?Procedure Laterality Date  ? BIOPSY  01/21/2019  ? Procedure: BIOPSY;  Surgeon: [name], MD;  [facility];  Service: Endoscopy;;  gastric bx and gastric polyps  ? BIOPSY  11/08/2020  ? Procedure: BIOPSY;  Surgeon: [name], MD;  [facility];  Service: Gastroenterology;;  ? CARDIAC CATHETERIZATION  2011  ? CARDIAC CATHETERIZATION N/A 09/20/2015  ? Procedure: Left Heart Cath and Coronary Angiography;  Surgeon: [name], MD;  Location: [place] CV LAB;  Service: Cardiovascular;  Laterality: N/A;  ? CHOLECYSTECTOMY   2003  ? COLONOSCOPY  06/19/2012  ? Procedure: COLONOSCOPY;  Surgeon: [name], MD;  [facility];  Service: Endoscopy;  Laterality: N/A;  930  ? COLONOSCOPY N/A 10/17/2017  ? Procedure: COLONOSCOPY;  Surgeon: [name], MD;  [facility];  Service: Endoscopy;  Laterality: N/A;  1225  ? COLONOSCOPY WITH PROPOFOL N/A 11/08/2020  ? Procedure: COLONOSCOPY WITH PROPOFOL;  Surgeon: [name], MD;  [facility];  Service: Gastroenterology;  Laterality: N/A;  10:45  ? COLONOSCOPY WITH PROPOFOL N/A 04/04/2021  ? Procedure: COLONOSCOPY WITH PROPOFOL;  Surgeon: [name], MD;  [facility];  Service: Gastroenterology;  Laterality: N/A;  ? CYST EXCISION  07/2019  ? ESOPHAGOGASTRODUODENOSCOPY (EGD) WITH PROPOFOL N/A 01/21/2019  ? Procedure: ESOPHAGOGASTRODUODENOSCOPY (EGD) WITH PROPOFOL;  Surgeon: [name], MD;  [facility];  Service: Endoscopy;  Laterality: N/A;  10:15am  ? ESOPHAGOGASTRODUODENOSCOPY (EGD) WITH PROPOFOL N/A 02/17/2021  ? Procedure: ESOPHAGOGASTRODUODENOSCOPY (EGD) WITH PROPOFOL;  Surgeon: [name], MD;  [facility];  Service: Gastroenterology;  Laterality: N/A;  10:20  ? ESOPHAGOGASTRODUODENOSCOPY (EGD) WITH PROPOFOL N/A 04/04/2021  ? Procedure: ESOPHAGOGASTRODUODENOSCOPY (EGD) WITH PROPOFOL;  Surgeon: [name], MD;  [facility];  Service: Gastroenterology;  Laterality: N/A;  12:30  ? GIVENS CAPSULE STUDY N/A 09/13/2021  ? Procedure: GIVENS CAPSULE STUDY;  Surgeon: [name], MD;  [facility];  Service: Gastroenterology;  Laterality: N/A;  7:30  ? KNEE ARTHROPLASTY Right 1999  ? LEFT HEART CATH AND CORONARY ANGIOGRAPHY N/A 02/26/2018  ? Procedure: LEFT HEART CATH AND CORONARY ANGIOGRAPHY;  Surgeon: [name], MD;  Location: [place] CV LAB;  Service: Cardiovascular;  Laterality: N/A;  ? LEFT HEART CATHETERIZATION  WITH CORONARY ANGIOGRAM N/A 12/09/2013  ? Procedure: LEFT HEART CATHETERIZATION WITH CORONARY ANGIOGRAM;  Surgeon: [name], MD;  Location: [facility] CATH LAB;  Service: Cardiovascular;  Laterality: N/A;  ? Liver biopsy    ? POLYPECTOMY  10/17/2017  ? Procedure: POLYPECTOMY;  Surgeon: [name], MD;  [facility];  Service: Endoscopy;;  ascending colon, splenic flexure,sigmoid x2  ? POLYPECTOMY  11/08/2020  ? Procedure: POLYPECTOMY;  Surgeon: [name], MD;  [facility];  Service: Gastroenterology;;  ? POLYPECTOMY  04/04/2021  ? Procedure: POLYPECTOMY;  Surgeon: [name], MD;  [facility];  Service: Gastroenterology;;  ? TENNIS ELBOW RELEASE/NIRSCHEL PROCEDURE Right 07/25/2017  ? Procedure: TENNIS ELBOW RELEASE and debriedment;  Surgeon: [name], MD;  Location[facility];  Service: Orthopedics;  Laterality: Right;  ? TONSILLECTOMY  1970's  ? ?Patient Active Problem List  ? Diagnosis Date Noted  ? Diarrhea 12/14/2021  ? Iron deficiency anemia 08/15/2021  ? Orthostatic hypotension 12/07/2020  ? IBS (irritable bowel syndrome) 11/17/2020  ? Chronic diarrhea 08/17/2020  ? NASH (nonalcoholic steatohepatitis) 08/17/2020  ? Elevated LFTs 08/17/2020  ? Tremor of both hands 05/06/2020  ? Bilateral occipital neuralgia 05/06/2020  ? Abdominal pain, chronic, epigastric 01/13/2019  ? Anxiety 01/04/2019  ? Radicular pain of left lower extremity 12/22/2018  ? Moderate persistent asthma 10/25/2018  ? Dizziness 07/12/2018  ? Nonintractable headache   ? Abnormal nuclear stress test   ? Aftercare following surgery 07/25/17 08/13/2017  ? Hx of colonic polyps 08/07/2017  ? Lateral epicondylitis of right elbow   ? Cervical nerve root impingement 12/09/2015  ? Generalized anxiety disorder 09/27/2015  ? Chest pain 09/20/2015  ? Pain in the chest   ? Depression 01/24/2015  ? Esophageal reflux 01/24/2015  ? Preoperative cardiovascular examination 06/28/2014  ?  DM type 2 causing vascular disease ([place])   ? Mixed hyperlipidemia   ? Obesity   ? Intermediate coronary syndrome ([place]) 12/09/2013  ? Lumbago 05/21/2012  ? Essential hypertension, benign 12/28/2009  ? CAD S/P LAD DES March 2015 12/28/2009  ? Hyperlipidemia LDL goal <70 11/25/2009  ? Accelerating angina ([place]) 11/25/2009  ? DM 11/24/2009  ? ABDOMINAL PAIN, HX OF 11/24/2009  ? ? ?REFERRING DIAG: M47.816 (ICD-10-CM) - Spondylosis without myelopathy or radiculopathy, lumbar region M54.50,G89.29 (ICD-10-CM) - Chronic bilateral low back pain without sciatica M47.816 (ICD-10-CM) - Facet hypertrophy of lumbar region M79.18 (ICD-10-CM) - Myofascial pain syndrome  ? ?THERAPY DIAG:  ?No diagnosis found. ? ?PERTINENT HISTORY: Chronic back pain ? ?PRECAUTIONS: None ? ?SUBJECTIVE: He feels his pain is moving up his spine. He had a busy few days, so he has not been doing much of his exercises. He would like to dry dry needling again today to see if it helps.  ? ?PAIN:  ?Are you having pain? Yes: NPRS scale: 7/10 ?Pain location: across low lumbar and mid back   ?Pain description: aching, sore ?Aggravating factors: "nothing in  particular" ?Relieving factors: Unsure  ? ?OBJECTIVE:  ?  ?  ?POSTURE:  ?Forward head ?  ?PALPATION: ?TTP lumbar and thoracic paraspinals bilaterally, hyperactive/taut, bilateral glute tenderness R>L; lumbar and thoracic CPA: grossly hypomobile ?  ?LUMBAR ROM:  ?  ?Active  A/PROM  ?12/14/2021 AROM ?12/26/21 AROM  ?01/01/22  ?Flexion 50% limited* 50% limited* 25% limited   ?Extension 50% limited* 50% limited * 50% limited *  ?Right lateral flexion 25% limited*    ?Left lateral flexion 25% limited*    ?Right rotation 25% limited    ?Left rotation 25% limited    ? (Blank rows = not tested) ?  ?  ?LE MMT: ?  ?MMT Right ?12/14/2021 Left ?12/14/2021  ?Hip flexion 4+/5 4+/5  ?Hip extension 4/5 4/5  ?Hip abduction 4/5 4+/5  ?Hip adduction      ?Hip internal rotation      ?Hip external rotation      ?Knee flexion 5/5 5/5   ?Knee extension 5/5 5/5  ?Ankle dorsiflexion 5/5 5/5  ?Ankle plantarflexion      ?Ankle inversion      ?Ankle eversion      ? (Blank rows = not tested) ?  ?LUMBAR SPECIAL TESTS:  ?NT ?  ?FUNCTIONAL TESTS:  ?

## 2022-01-01 NOTE — Telephone Encounter (Signed)
My Chart message sent to patient with Dr Bary Leriche recommendations ?

## 2022-01-03 ENCOUNTER — Telehealth: Payer: Self-pay

## 2022-01-03 ENCOUNTER — Ambulatory Visit: Payer: Medicare Other | Admitting: Physical Medicine and Rehabilitation

## 2022-01-03 ENCOUNTER — Encounter (HOSPITAL_COMMUNITY): Payer: Medicare Other | Admitting: Physical Therapy

## 2022-01-03 NOTE — Telephone Encounter (Signed)
This pt called and lm on vm for the triage phone to advise that he is very ill and would not make his appt this morning. He did call at 8 am to advise. He said that he will call back to resch. FYI  ?

## 2022-01-05 ENCOUNTER — Other Ambulatory Visit: Payer: Self-pay | Admitting: Neurosurgery

## 2022-01-05 DIAGNOSIS — M47812 Spondylosis without myelopathy or radiculopathy, cervical region: Secondary | ICD-10-CM

## 2022-01-09 ENCOUNTER — Encounter (HOSPITAL_COMMUNITY): Payer: Self-pay | Admitting: Physical Therapy

## 2022-01-09 ENCOUNTER — Ambulatory Visit (HOSPITAL_COMMUNITY): Payer: Medicare Other | Admitting: Physical Therapy

## 2022-01-09 DIAGNOSIS — M5416 Radiculopathy, lumbar region: Secondary | ICD-10-CM

## 2022-01-09 DIAGNOSIS — R2689 Other abnormalities of gait and mobility: Secondary | ICD-10-CM

## 2022-01-09 DIAGNOSIS — M545 Low back pain, unspecified: Secondary | ICD-10-CM | POA: Diagnosis not present

## 2022-01-09 NOTE — Therapy (Signed)
?OUTPATIENT PHYSICAL THERAPY TREATMENT NOTE ? ? ?Patient Name: Jared Griffin ?MRN: [id number] ?DOB:August 27, 1961, 61 y.o., male ?Today's Date: 01/09/2022 ? ?PCP: [name], MD ?REFERRING PROVIDER: Lorine Bears, NP  ? ?END OF SESSION:  ? PT End of Session - 01/09/22 8003   ? ? Visit Number 5   ? Number of Visits 12   ? Date for PT Re-Evaluation 01/25/22   ? Authorization Type UHC Medicare (no auth, no VL)   ? Progress Note Due on Visit 10   ? PT Start Time [phone]   ? PT Stop Time 0949   ? PT Time Calculation (min) 44 min   ? Activity Tolerance Patient tolerated treatment well   ? Behavior During Therapy [facility] for tasks assessed/performed   ? ?  ?  ? ?  ? ? ? ?Past Medical History:  ?Diagnosis Date  ? Anxiety   ? COPD (chronic obstructive pulmonary disease) ([place])   ? Coronary atherosclerosis of native coronary artery   ? a. 12/09/2013: s/p PCI with 3.0 x 28 Promus DES to mLAD  ? Depression   ? Diabetic neuropathy ([place])   ? Elbow injury 07/25/2017  ? Surgery for torn tendon  ? Essential hypertension   ? Fatty liver disease, nonalcoholic   ? GERD (gastroesophageal reflux disease)   ? Iron deficiency anemia 08/15/2021  ? Lateral epicondylitis of right elbow   ? Mixed hyperlipidemia   ? Obesity   ? Osgood-Schlatter's disease of right knee   ? Skin cyst 10/03/2017  ? Removed from back  ? Type 2 diabetes mellitus ([place])   ? ?Past Surgical History:  ?Procedure Laterality Date  ? BIOPSY  01/21/2019  ? Procedure: BIOPSY;  Surgeon: [name], MD;  [facility];  Service: Endoscopy;;  gastric bx and gastric polyps  ? BIOPSY  11/08/2020  ? Procedure: BIOPSY;  Surgeon: [name], MD;  [facility];  Service: Gastroenterology;;  ? CARDIAC CATHETERIZATION  2011  ? CARDIAC CATHETERIZATION N/A 09/20/2015  ? Procedure: Left Heart Cath and Coronary Angiography;  Surgeon: [name], MD;  Location: [place] [facility];  Service: Cardiovascular;  Laterality: N/A;  ? CHOLECYSTECTOMY   2003  ? COLONOSCOPY  06/19/2012  ? Procedure: COLONOSCOPY;  Surgeon: [name], MD;  [facility];  Service: Endoscopy;  Laterality: N/A;  930  ? COLONOSCOPY N/A 10/17/2017  ? Procedure: COLONOSCOPY;  Surgeon: [name], MD;  [facility];  Service: Endoscopy;  Laterality: N/A;  1225  ? COLONOSCOPY WITH PROPOFOL N/A 11/08/2020  ? Procedure: COLONOSCOPY WITH PROPOFOL;  Surgeon: [name], MD;  [facility];  Service: Gastroenterology;  Laterality: N/A;  10:45  ? COLONOSCOPY WITH PROPOFOL N/A 04/04/2021  ? Procedure: COLONOSCOPY WITH PROPOFOL;  Surgeon: [name], MD;  [facility];  Service: Gastroenterology;  Laterality: N/A;  ? CYST EXCISION  07/2019  ? ESOPHAGOGASTRODUODENOSCOPY (EGD) WITH PROPOFOL N/A 01/21/2019  ? Procedure: ESOPHAGOGASTRODUODENOSCOPY (EGD) WITH PROPOFOL;  Surgeon: [name], MD;  [facility];  Service: Endoscopy;  Laterality: N/A;  10:15am  ? ESOPHAGOGASTRODUODENOSCOPY (EGD) WITH PROPOFOL N/A 02/17/2021  ? Procedure: ESOPHAGOGASTRODUODENOSCOPY (EGD) WITH PROPOFOL;  Surgeon: [name], MD;  [facility];  Service: Gastroenterology;  Laterality: N/A;  10:20  ? ESOPHAGOGASTRODUODENOSCOPY (EGD) WITH PROPOFOL N/A 04/04/2021  ? Procedure: ESOPHAGOGASTRODUODENOSCOPY (EGD) WITH PROPOFOL;  Surgeon: [name], MD;  [facility];  Service: Gastroenterology;  Laterality: N/A;  12:30  ? GIVENS CAPSULE STUDY N/A 09/13/2021  ? Procedure: GIVENS CAPSULE STUDY;  Surgeon: [name], MD;  [facility];  Service: Gastroenterology;  Laterality: N/A;  7:30  ? KNEE ARTHROPLASTY Right 1999  ? LEFT HEART CATH AND CORONARY ANGIOGRAPHY N/A 02/26/2018  ? Procedure: LEFT HEART CATH AND CORONARY ANGIOGRAPHY;  Surgeon: [name], MD;  Location: [place] [facility];  Service: Cardiovascular;  Laterality: N/A;  ? LEFT HEART CATHETERIZATION  WITH CORONARY ANGIOGRAM N/A 12/09/2013  ? Procedure: LEFT HEART CATHETERIZATION WITH CORONARY ANGIOGRAM;  Surgeon: [name], MD;  Location: [facility];  Service: Cardiovascular;  Laterality: N/A;  ? Liver biopsy    ? POLYPECTOMY  10/17/2017  ? Procedure: POLYPECTOMY;  Surgeon: [name], MD;  [facility];  Service: Endoscopy;;  ascending colon, splenic flexure,sigmoid x2  ? POLYPECTOMY  11/08/2020  ? Procedure: POLYPECTOMY;  Surgeon: [name], MD;  [facility];  Service: Gastroenterology;;  ? POLYPECTOMY  04/04/2021  ? Procedure: POLYPECTOMY;  Surgeon: [name], MD;  [facility];  Service: Gastroenterology;;  ? TENNIS ELBOW RELEASE/NIRSCHEL PROCEDURE Right 07/25/2017  ? Procedure: TENNIS ELBOW RELEASE and debriedment;  Surgeon: [name], MD;  [facility]: [facility];  Service: Orthopedics;  Laterality: Right;  ? TONSILLECTOMY  1970's  ? ?Patient Active Problem List  ? Diagnosis Date Noted  ? Diarrhea 12/14/2021  ? Iron deficiency anemia 08/15/2021  ? Orthostatic hypotension 12/07/2020  ? IBS (irritable bowel syndrome) 11/17/2020  ? Chronic diarrhea 08/17/2020  ? NASH (nonalcoholic steatohepatitis) 08/17/2020  ? Elevated LFTs 08/17/2020  ? Tremor of both hands 05/06/2020  ? Bilateral occipital neuralgia 05/06/2020  ? Abdominal pain, chronic, epigastric 01/13/2019  ? Anxiety 01/04/2019  ? Radicular pain of left lower extremity 12/22/2018  ? Moderate persistent asthma 10/25/2018  ? Dizziness 07/12/2018  ? Nonintractable headache   ? Abnormal nuclear stress test   ? Aftercare following surgery 07/25/17 08/13/2017  ? Hx of colonic polyps 08/07/2017  ? Lateral epicondylitis of right elbow   ? Cervical nerve root impingement 12/09/2015  ? Generalized anxiety disorder 09/27/2015  ? Chest pain 09/20/2015  ? Pain in the chest   ? Depression 01/24/2015  ? Esophageal reflux 01/24/2015  ? Preoperative cardiovascular examination 06/28/2014  ?  DM type 2 causing vascular disease ([place])   ? Mixed hyperlipidemia   ? Obesity   ? Intermediate coronary syndrome ([place]) 12/09/2013  ? Lumbago 05/21/2012  ? Essential hypertension, benign 12/28/2009  ? CAD S/P LAD DES March 2015 12/28/2009  ? Hyperlipidemia LDL goal <70 11/25/2009  ? Accelerating angina ([place]) 11/25/2009  ? DM 11/24/2009  ? ABDOMINAL PAIN, HX OF 11/24/2009  ? ? ?REFERRING DIAG: M47.816 (ICD-10-CM) - Spondylosis without myelopathy or radiculopathy, lumbar region M54.50,G89.29 (ICD-10-CM) - Chronic bilateral low back pain without sciatica M47.816 (ICD-10-CM) - Facet hypertrophy of lumbar region M79.18 (ICD-10-CM) - Myofascial pain syndrome  ? ?THERAPY DIAG:  ?Low back pain, unspecified back pain laterality, unspecified chronicity, unspecified whether sciatica present ? ?Radiculopathy, lumbar region ? ?Other abnormalities of gait and mobility ? ?PERTINENT HISTORY: Chronic back pain ? ?PRECAUTIONS: None ? ?SUBJECTIVE: Doing a little better today. He feels DN has been helpful. Feeling more in his lower back today.  ? ?PAIN:  ?Are you having pain? Yes: NPRS scale: 4/10 ?Pain location: across low lumbar   ?Pain description: aching, sore ?Aggravating factors: "nothing in particular" ?Relieving factors:  Unsure  ? ?OBJECTIVE:  ?  ?  ?POSTURE:  ?Forward head ?  ?PALPATION: ?TTP lumbar and thoracic paraspinals bilaterally, hyperactive/taut, bilateral glute tenderness R>L; lumbar and thoracic CPA: grossly hypomobile ?  ?LUMBAR ROM:  ?  ?Active  A/PROM  ?12/14/2021 AROM ?12/26/21 AROM  ?01/01/22  ?Flexion 50% limited* 50% limited* 25% limited   ?Extension 50% limited* 50% limited * 50% limited *  ?Right lateral flexion 25% limited*    ?Left lateral flexion 25% limited*    ?Right rotation 25% limited    ?Left rotation 25% limited    ? (Blank rows = not tested) ?  ?  ?LE MMT: ?  ?MMT Right ?12/14/2021 Left ?12/14/2021  ?Hip flexion 4+/5 4+/5  ?Hip extension 4/5 4/5  ?Hip abduction 4/5 4+/5  ?Hip adduction      ?Hip  internal rotation      ?Hip external rotation      ?Knee flexion 5/5 5/5  ?Knee extension 5/5 5/5  ?Ankle dorsiflexion 5/5 5/5  ?Ankle plantarflexion      ?Ankle inversion      ?Ankle eversion      ? (Blank rows

## 2022-01-11 ENCOUNTER — Ambulatory Visit (HOSPITAL_COMMUNITY): Payer: Medicare Other | Admitting: Physical Therapy

## 2022-01-11 ENCOUNTER — Encounter (HOSPITAL_COMMUNITY): Payer: Self-pay | Admitting: Physical Therapy

## 2022-01-11 DIAGNOSIS — M545 Low back pain, unspecified: Secondary | ICD-10-CM | POA: Diagnosis not present

## 2022-01-11 DIAGNOSIS — M5416 Radiculopathy, lumbar region: Secondary | ICD-10-CM

## 2022-01-11 NOTE — Therapy (Signed)
?OUTPATIENT PHYSICAL THERAPY TREATMENT NOTE ? ? ?Patient Name: Jared Griffin ?MRN: [id number] ?DOB:04-13-1961, 61 y.o., male ?Today's Date: 01/11/2022 ? ?PCP: [name], MD ?REFERRING PROVIDER: Lorine Bears, NP  ? ?END OF SESSION:  ? PT End of Session - 01/11/22 1306   ? ? Visit Number 6   ? Number of Visits 12   ? Date for PT Re-Evaluation 01/25/22   ? Authorization Type UHC Medicare (no auth, no VL)   ? Progress Note Due on Visit 10   ? PT Start Time 7829   ? PT Stop Time 5621   ? PT Time Calculation (min) 40 min   ? Activity Tolerance Patient tolerated treatment well   ? Behavior During Therapy [facility] for tasks assessed/performed   ? ?  ?  ? ?  ? ? ? ?Past Medical History:  ?Diagnosis Date  ? Anxiety   ? COPD (chronic obstructive pulmonary disease) ([place])   ? Coronary atherosclerosis of native coronary artery   ? a. 12/09/2013: s/p PCI with 3.0 x 28 Promus DES to mLAD  ? Depression   ? Diabetic neuropathy ([place])   ? Elbow injury 07/25/2017  ? Surgery for torn tendon  ? Essential hypertension   ? Fatty liver disease, nonalcoholic   ? GERD (gastroesophageal reflux disease)   ? Iron deficiency anemia 08/15/2021  ? Lateral epicondylitis of right elbow   ? Mixed hyperlipidemia   ? Obesity   ? Osgood-Schlatter's disease of right knee   ? Skin cyst 10/03/2017  ? Removed from back  ? Type 2 diabetes mellitus ([place])   ? ?Past Surgical History:  ?Procedure Laterality Date  ? BIOPSY  01/21/2019  ? Procedure: BIOPSY;  Surgeon: [name], MD;  [facility];  Service: Endoscopy;;  gastric bx and gastric polyps  ? BIOPSY  11/08/2020  ? Procedure: BIOPSY;  Surgeon: [name], MD;  [facility];  Service: Gastroenterology;;  ? CARDIAC CATHETERIZATION  2011  ? CARDIAC CATHETERIZATION N/A 09/20/2015  ? Procedure: Left Heart Cath and Coronary Angiography;  Surgeon: [name], MD;  Location: [place] [facility];  Service: Cardiovascular;  Laterality: N/A;  ? CHOLECYSTECTOMY   2003  ? COLONOSCOPY  06/19/2012  ? Procedure: COLONOSCOPY;  Surgeon: [name], MD;  [facility];  Service: Endoscopy;  Laterality: N/A;  930  ? COLONOSCOPY N/A 10/17/2017  ? Procedure: COLONOSCOPY;  Surgeon: [name], MD;  [facility];  Service: Endoscopy;  Laterality: N/A;  1225  ? COLONOSCOPY WITH PROPOFOL N/A 11/08/2020  ? Procedure: COLONOSCOPY WITH PROPOFOL;  Surgeon: [name], MD;  [facility];  Service: Gastroenterology;  Laterality: N/A;  10:45  ? COLONOSCOPY WITH PROPOFOL N/A 04/04/2021  ? Procedure: COLONOSCOPY WITH PROPOFOL;  Surgeon: [name], MD;  [facility];  Service: Gastroenterology;  Laterality: N/A;  ? CYST EXCISION  07/2019  ? ESOPHAGOGASTRODUODENOSCOPY (EGD) WITH PROPOFOL N/A 01/21/2019  ? Procedure: ESOPHAGOGASTRODUODENOSCOPY (EGD) WITH PROPOFOL;  Surgeon: [name], MD;  [facility];  Service: Endoscopy;  Laterality: N/A;  10:15am  ? ESOPHAGOGASTRODUODENOSCOPY (EGD) WITH PROPOFOL N/A 02/17/2021  ? Procedure: ESOPHAGOGASTRODUODENOSCOPY (EGD) WITH PROPOFOL;  Surgeon: [name], MD;  [facility];  Service: Gastroenterology;  Laterality: N/A;  10:20  ? ESOPHAGOGASTRODUODENOSCOPY (EGD) WITH PROPOFOL N/A 04/04/2021  ? Procedure: ESOPHAGOGASTRODUODENOSCOPY (EGD) WITH PROPOFOL;  Surgeon: [name], MD;  [facility];  Service: Gastroenterology;  Laterality: N/A;  12:30  ? GIVENS CAPSULE STUDY N/A 09/13/2021  ? Procedure: GIVENS CAPSULE STUDY;  Surgeon: [name], MD;  [facility];  Service: Gastroenterology;  Laterality: N/A;  7:30  ? KNEE ARTHROPLASTY Right 1999  ? LEFT HEART CATH AND CORONARY ANGIOGRAPHY N/A 02/26/2018  ? Procedure: LEFT HEART CATH AND CORONARY ANGIOGRAPHY;  Surgeon: [name], MD;  Location: [place] [facility];  Service: Cardiovascular;  Laterality: N/A;  ? LEFT HEART CATHETERIZATION  WITH CORONARY ANGIOGRAM N/A 12/09/2013  ? Procedure: LEFT HEART CATHETERIZATION WITH CORONARY ANGIOGRAM;  Surgeon: [name], MD;  Location: [place] CATH LAB;  Service: Cardiovascular;  Laterality: N/A;  ? Liver biopsy    ? POLYPECTOMY  10/17/2017  ? Procedure: POLYPECTOMY;  Surgeon: [name], MD;  [facility];  Service: Endoscopy;;  ascending colon, splenic flexure,sigmoid x2  ? POLYPECTOMY  11/08/2020  ? Procedure: POLYPECTOMY;  Surgeon: [name], MD;  [facility];  Service: Gastroenterology;;  ? POLYPECTOMY  04/04/2021  ? Procedure: POLYPECTOMY;  Surgeon: [name], MD;  [facility];  Service: Gastroenterology;;  ? TENNIS ELBOW RELEASE/NIRSCHEL PROCEDURE Right 07/25/2017  ? Procedure: TENNIS ELBOW RELEASE and debriedment;  Surgeon: [name], MD;  [facility];  Service: Orthopedics;  Laterality: Right;  ? TONSILLECTOMY  1970's  ? ?Patient Active Problem List  ? Diagnosis Date Noted  ? Diarrhea 12/14/2021  ? Iron deficiency anemia 08/15/2021  ? Orthostatic hypotension 12/07/2020  ? IBS (irritable bowel syndrome) 11/17/2020  ? Chronic diarrhea 08/17/2020  ? NASH (nonalcoholic steatohepatitis) 08/17/2020  ? Elevated LFTs 08/17/2020  ? Tremor of both hands 05/06/2020  ? Bilateral occipital neuralgia 05/06/2020  ? Abdominal pain, chronic, epigastric 01/13/2019  ? Anxiety 01/04/2019  ? Radicular pain of left lower extremity 12/22/2018  ? Moderate persistent asthma 10/25/2018  ? Dizziness 07/12/2018  ? Nonintractable headache   ? Abnormal nuclear stress test   ? Aftercare following surgery 07/25/17 08/13/2017  ? Hx of colonic polyps 08/07/2017  ? Lateral epicondylitis of right elbow   ? Cervical nerve root impingement 12/09/2015  ? Generalized anxiety disorder 09/27/2015  ? Chest pain 09/20/2015  ? Pain in the chest   ? Depression 01/24/2015  ? Esophageal reflux 01/24/2015  ? Preoperative cardiovascular examination 06/28/2014  ?  DM type 2 causing vascular disease ([place])   ? Mixed hyperlipidemia   ? Obesity   ? Intermediate coronary syndrome ([place]) 12/09/2013  ? Lumbago 05/21/2012  ? Essential hypertension, benign 12/28/2009  ? CAD S/P LAD DES March 2015 12/28/2009  ? Hyperlipidemia LDL goal <70 11/25/2009  ? Accelerating angina ([place]) 11/25/2009  ? DM 11/24/2009  ? ABDOMINAL PAIN, HX OF 11/24/2009  ? ? ?REFERRING DIAG: M47.816 (ICD-10-CM) - Spondylosis without myelopathy or radiculopathy, lumbar region M54.50,G89.29 (ICD-10-CM) - Chronic bilateral low back pain without sciatica M47.816 (ICD-10-CM) - Facet hypertrophy of lumbar region M79.18 (ICD-10-CM) - Myofascial pain syndrome  ? ?THERAPY DIAG:  ?Low back pain, unspecified back pain laterality, unspecified chronicity, unspecified whether sciatica present ? ?Radiculopathy, lumbar region ? ?PERTINENT HISTORY: Chronic back pain ? ?PRECAUTIONS: None ? ?SUBJECTIVE: Doing good so far today. Did a little more around the house today. Feels dry needling has been helpful. Hasn't been doing much of HEP.  ? ?PAIN:  ?Are you having pain? Yes: NPRS scale: 1/10 ?Pain location: across low lumbar   ?Pain description: aching, sore ?Aggravating factors: "nothing in particular" ?Relieving factors:  Unsure  ? ?OBJECTIVE:  ?  ?  ?POSTURE:  ?Forward head ?  ?PALPATION: ?TTP lumbar and thoracic paraspinals bilaterally, hyperactive/taut, bilateral glute tenderness R>L; lumbar and thoracic CPA: grossly hypomobile ?  ?LUMBAR ROM:  ?  ?Active  A/PROM  ?12/14/2021 AROM ?12/26/21 AROM  ?01/01/22  ?Flexion 50% limited* 50% limited* 25% limited   ?Extension 50% limited* 50% limited * 50% limited *  ?Right lateral flexion 25% limited*    ?Left lateral flexion 25% limited*    ?Right rotation 25% limited    ?Left rotation 25% limited    ? (Blank rows = not tested) ?  ?  ?LE MMT: ?  ?MMT Right ?12/14/2021 Left ?12/14/2021  ?Hip flexion 4+/5 4+/5  ?Hip extension 4/5 4/5  ?Hip abduction 4/5 4+/5  ?Hip adduction      ?Hip internal  rotation      ?Hip external rotation      ?Knee flexion 5/5 5/5  ?Knee extension 5/5 5/5  ?Ankle dorsiflexion 5/5 5/5  ?Ankle plantarflexion      ?Ankle inversion      ?Ankle eversion      ? (Blank rows = not

## 2022-01-12 NOTE — Progress Notes (Deleted)
Assessment/Plan:    1.  Tremor  -Told the patient again that there was no evidence that this tremor is related to his prior work injury.  -DaTscan previously done and was negative.  -tremor has some functional qualities but also has some qualities consistent with essential tremor.  Would like to try primidone, but this interacts with his Ranexa.  He is already on a beta-blocker.  He really has very few other options.  I went ahead and sent the primidone to his pharmacy, but asked him not to take it until I talk with his cardiologist about it.  I will plan on getting back in touch with the patient next week.  Subjective:   Jared Griffin was seen today in follow up for tremor.  My previous records were reviewed prior to todays visit.  Last visit his bisoprolol was discontinued.  We instead started propranolol, 20 mg twice per day.  Cardiology did not want him on primidone because of interaction with Ranexa.  Records are reviewed since last visit.  Following with psychiatry.  Reports that he is doing much better.  He is on Xanax up to 3 times per day in addition to his sertraline, 150 mg daily and trazodone, 50 mg at bedtime.  Patient has been following with Dr. Saintclair Halsted for neck pain.    Meds that may affect tremor:  xanax 1 mg tid; gabapentin; albuterol; atrovent   Previous meds: (Cannot take primidone due to interaction with Ranexa)  ALLERGIES:   Allergies  Allergen Reactions   Doxycycline     Severe esophagitis    CURRENT MEDICATIONS:  Outpatient Encounter Medications as of 01/15/2022  Medication Sig   acetaminophen (TYLENOL) 500 MG tablet Take 1,000 mg by mouth every 8 (eight) hours as needed for moderate pain.   albuterol (VENTOLIN HFA) 108 (90 Base) MCG/ACT inhaler INHALE 2 PUFFS INTO LUNGS EVERY 6 HOURS AS NEEDED FOR WHEEZING AND SHORTNESS OF BREATH. (Patient taking differently: Inhale 2 puffs into the lungs every 6 (six) hours as needed for shortness of breath or wheezing.  INHALE 2 PUFFS INTO LUNGS EVERY 6 HOURS AS NEEDED FOR WHEEZING AND SHORTNESS OF BREATH.)   ALPRAZolam (XANAX) 1 MG tablet Take 1 tablet (1 mg total) by mouth 3 (three) times daily as needed for anxiety.   aspirin 81 MG EC tablet Take 1 tablet (81 mg total) by mouth daily.   atorvastatin (LIPITOR) 40 MG tablet TAKE ONE TABLET (40MG TOTAL) BY MOUTH DAILY   B-D UF III MINI PEN NEEDLES 31G X 5 MM MISC USE UP TO 4 TIMES DAILY WITH INSULIN AS DIRECTED.   cetirizine (ZYRTEC) 10 MG tablet Take 1 tablet (10 mg total) by mouth daily.   colestipol (COLESTID) 1 g tablet Take 2 tablets (2 g total) by mouth daily. Take 4 hours apart from other medication (Patient taking differently: Take 2 g by mouth daily. Take 4 hours apart from other medication prn)   Continuous Blood Gluc Sensor (FREESTYLE LIBRE 14 DAY SENSOR) MISC Inject 1 each into the skin every 14 (fourteen) days. Use as directed.   dapagliflozin propanediol (FARXIGA) 5 MG TABS tablet Take 5 mg by mouth daily.   Doxylamine-Pyridoxine 10-10 MG TBEC Take 1 each by mouth every 12 (twelve) hours as needed (nausea).   fluticasone (FLONASE) 50 MCG/ACT nasal spray Place 2 sprays into both nostrils daily. (Patient taking differently: Place 2 sprays into both nostrils daily as needed for allergies.)   fluticasone-salmeterol (ADVAIR HFA) 115-21 MCG/ACT  inhaler INHALE 2 PUFFS INTO THE LUNGS TWICE DAILY   gabapentin (NEURONTIN) 300 MG capsule 2 qam and 2qevening   HUMALOG KWIKPEN 200 UNIT/ML KwikPen Inject 30-35 Units into the skin 3 (three) times daily.   ipratropium (ATROVENT) 0.06 % nasal spray Place 2 sprays into both nostrils in the morning and at bedtime.   JANUVIA 100 MG tablet Take 100 mg by mouth daily.   Lancets MISC 1 each by Does not apply route 4 (four) times daily.   losartan (COZAAR) 25 MG tablet TAKE (1) TABLET BY MOUTH ONCE DAILY. (Patient taking differently: Take 25 mg by mouth daily.)   methocarbamol (ROBAXIN) 500 MG tablet Take 500 mg by mouth  every 8 (eight) hours as needed for muscle spasms.   nitroGLYCERIN (NITROSTAT) 0.4 MG SL tablet Place 1 tablet (0.4 mg total) under the tongue every 5 (five) minutes as needed.   Omega-3 Fatty Acids (FISH OIL) 1200 MG CAPS Take 2 capsules (2,400 mg total) by mouth 2 (two) times daily.   omeprazole (PRILOSEC) 40 MG capsule Take 1 capsule (40 mg total) by mouth daily.   ondansetron (ZOFRAN-ODT) 8 MG disintegrating tablet TAKE ONE TABLET (8MG TOTAL) BY MOUTH EVERY 8 HOURS AS NEEDED FRO NAUSEA OR VOMITING   propranolol (INDERAL) 20 MG tablet TAKE ONE TABLET (20MG TOTAL) BY MOUTH TWO TIMES DAILY   ranolazine (RANEXA) 1000 MG SR tablet TAKE ONE TABLET (1000MG TOTAL) BY MOUTH TWO TIMES DAILY   Semaglutide,0.25 or 0.5MG/DOS, (OZEMPIC, 0.25 OR 0.5 MG/DOSE,) 2 MG/3ML SOPN See admin instructions.   sertraline (ZOLOFT) 100 MG tablet Take 1.5 tablets (150 mg total) by mouth daily.   TOUJEO MAX SOLOSTAR 300 UNIT/ML Solostar Pen INJECT 130 UNITS INTO THE SKIN AT BEDTIME. (Patient taking differently: Inject 40-50 Units into the skin at bedtime.)   traZODone (DESYREL) 50 MG tablet Take 1 tablet (50 mg total) by mouth at bedtime as needed.   No facility-administered encounter medications on file as of 01/15/2022.     Objective:    PHYSICAL EXAMINATION:    VITALS:   There were no vitals filed for this visit.   GEN:  The patient appears stated age and is in NAD. HEENT:  Normocephalic, atraumatic.  The mucous membranes are moist. The superficial temporal arteries are without ropiness or tenderness. CV:  RRR Lungs:  CTAB Neck/HEME:  There are no carotid bruits bilaterally.  Neurological examination:  Orientation: The patient is alert and oriented x3. Cranial nerves: There is good facial symmetry. The speech is fluent and clear. Soft palate rises symmetrically and there is no tongue deviation. Hearing is intact to conversational tone. Sensation: Sensation is intact to light touch throughout Motor:  Strength is at least antigravity x4.  Movement examination: Tone: There is some trouble relaxing, but when he does tone appears to be normal. Abnormal movements: there is Tremor in the RUE >LUE with some inconsistent evidence of entrainment.  He has some trouble with Archimedes spirals. Coordination:  There is no decremation today with rapid alternating movements.  I have reviewed and interpreted the following labs independently   Chemistry      Component Value Date/Time   NA 135 06/20/2021 0839   NA 137 05/25/2021 0958   K 3.9 06/20/2021 0839   CL 101 06/20/2021 0839   CO2 24 06/20/2021 0839   BUN 14 06/20/2021 0839   BUN 15 05/25/2021 0958   CREATININE 0.91 06/20/2021 0839   CREATININE 0.93 08/17/2020 1512      Component Value  Date/Time   CALCIUM 9.4 06/20/2021 0839   ALKPHOS 46 06/20/2021 0839   AST 45 (H) 06/20/2021 0839   ALT 49 (H) 06/20/2021 0839   BILITOT 0.8 06/20/2021 0839   BILITOT 0.5 05/25/2021 0958      Lab Results  Component Value Date   WBC 6.8 12/06/2021   HGB 16.8 12/06/2021   HCT 50.1 12/06/2021   MCV 89.0 12/06/2021   PLT 150 12/06/2021   Lab Results  Component Value Date   TSH 0.648 08/20/2019     Chemistry      Component Value Date/Time   NA 135 06/20/2021 0839   NA 137 05/25/2021 0958   K 3.9 06/20/2021 0839   CL 101 06/20/2021 0839   CO2 24 06/20/2021 0839   BUN 14 06/20/2021 0839   BUN 15 05/25/2021 0958   CREATININE 0.91 06/20/2021 0839   CREATININE 0.93 08/17/2020 1512      Component Value Date/Time   CALCIUM 9.4 06/20/2021 0839   ALKPHOS 46 06/20/2021 0839   AST 45 (H) 06/20/2021 0839   ALT 49 (H) 06/20/2021 0839   BILITOT 0.8 06/20/2021 0839   BILITOT 0.5 05/25/2021 0958         Total time spent on today's visit was *** minutes, including both face-to-face time and nonface-to-face time.  Time included that spent on review of records (prior notes available to me/labs/imaging if pertinent), discussing treatment and goals,  answering patient's questions and coordinating care.  Cc:  [name], MD

## 2022-01-14 ENCOUNTER — Ambulatory Visit
Admission: RE | Admit: 2022-01-14 | Discharge: 2022-01-14 | Disposition: A | Discharge status: Home / Self Care | Payer: Medicare Other | Source: Ambulatory Visit | Attending: Neurosurgery | Admitting: Neurosurgery

## 2022-01-14 DIAGNOSIS — M47812 Spondylosis without myelopathy or radiculopathy, cervical region: Secondary | ICD-10-CM

## 2022-01-15 ENCOUNTER — Ambulatory Visit: Payer: Medicare Other | Admitting: Neurology

## 2022-01-16 ENCOUNTER — Encounter (HOSPITAL_COMMUNITY): Payer: Medicare Other | Admitting: Physical Therapy

## 2022-01-18 ENCOUNTER — Encounter (HOSPITAL_COMMUNITY): Payer: Medicare Other | Admitting: Physical Therapy

## 2022-01-22 ENCOUNTER — Telehealth: Payer: Self-pay

## 2022-01-22 ENCOUNTER — Other Ambulatory Visit: Payer: Self-pay | Admitting: Cardiology

## 2022-01-22 ENCOUNTER — Ambulatory Visit: Payer: Medicare Other | Admitting: Physical Medicine and Rehabilitation

## 2022-01-22 ENCOUNTER — Encounter: Payer: Self-pay | Admitting: Physical Medicine and Rehabilitation

## 2022-01-22 VITALS — BP 124/83 | HR 81

## 2022-01-22 DIAGNOSIS — M47816 Spondylosis without myelopathy or radiculopathy, lumbar region: Secondary | ICD-10-CM

## 2022-01-22 DIAGNOSIS — M7918 Myalgia, other site: Secondary | ICD-10-CM | POA: Diagnosis not present

## 2022-01-22 DIAGNOSIS — G8929 Other chronic pain: Secondary | ICD-10-CM

## 2022-01-22 DIAGNOSIS — M545 Low back pain, unspecified: Secondary | ICD-10-CM

## 2022-01-22 NOTE — Progress Notes (Signed)
Jared Griffin - 61 y.o. male MRN [id number]  Date of birth: 06/25/1961  Office Visit Note: Visit Date: 01/22/2022 PCP: Babs Sciara, MD Referred by: Babs Sciara, MD  Subjective: Chief Complaint  Patient presents with   Lower Back - Pain   HPI: Jared Griffin is a 61 y.o. male who comes in today for evaluation of chronic, worsening and severe bilateral lower back pain, right greater than left. Patient also reports pain radiating up back to bilateral shoulders. Patient reports pain has been ongoing for several years.  Patient reports pain is exacerbated by movement and activity, he describes pain as a constant sore and aching sensation, currently rates as 8 out of 10.  Patient reports some relief of pain with home exercise regimen, rest and use of medications. Patient continues to take Gabapentin and does take Robaxin/Tramadol as needed. Patient states he is not sleeping well and typically gets 3-4 hours of sleep each night. Patient is currently attending formal physical therapy at [facility] where he is undergoing dry needling, he reports short term pain relief with these treatments. Patient also reports history of formal chiropractic treatments with Dr. Magnus Sinning at [facility], he reports some relief of pain with these treatments. Patient's lumbar MRI from 2021 exhibits transitional lumbosacral anatomy, shallow disc bulge/ligamentum flavum thickening at L2-L3 and scattered facet arthrosis most significant at L3-L4. Patient was initially seen in our office in 2021, at that time we did perform right L3-L4 intra-articular facet joint injections with minimal relief of pain. Patient had right L3-L4 interlaminar epidural steroid injection performed in our office on 11/16/2021 and reports significant and sustained pain relief, greater than 60% for approximately 2 months.   Patient also reports chronic, worsening and severe pain from lower back radiating up to bilateral  shoulders. Patient states his entire back hurts. Patient states pain is constant, however worsens with movement and activity, he describes pain as sore and aching sensation, currently rates as 8 out of 10. Patient reports history of physical therapy, dry needling and chiropractic treatments, all provide some short term relief of pain. Patient has not tried massage therapy.   Patient denies focal weakness, numbness and tingling. Patient denies recent trauma or falls.   Review of Systems  Musculoskeletal:  Positive for back pain and myalgias.  Neurological:  Negative for tingling, sensory change, focal weakness and weakness.  All other systems reviewed and are negative. Otherwise per HPI.  Assessment & Plan: Visit Diagnoses:    ICD-10-CM   1. Spondylosis without myelopathy or radiculopathy, lumbar region  M47.816 Ambulatory referral to Physical Medicine Rehab    2. Chronic bilateral low back pain without sciatica  M54.50 Ambulatory referral to Physical Medicine Rehab   G89.29     3. Facet hypertrophy of lumbar region  M47.816 Ambulatory referral to Physical Medicine Rehab    4. Myofascial pain syndrome  M79.18 Ambulatory referral to Physical Medicine Rehab       Plan: Findings:  1.  Chronic, worsening and severe bilateral lower back pain, right greater than left.  Patient continues to have severe pain despite good conservative therapy such as formal physical therapy, chiropractic treatments, home exercise regimen, rest and use of medications. Patients clinical presentation and exam are complex, differentials could include discogenic vs mechanical pain. His symptoms do not directly correlate with lumbar MRI findings. We believe the next step is to repeat right L3-L4 interlaminar epidural steroid injection under fluoroscopic guidance. Patient is not currently  taking anticoagulants. If patients pain persists following injection we would consider obtaining new lumbar MRI imaging.   2. Chronic,  worsening and severe pain from lower back radiating up to bilateral shoulders. Patient continues to have severe pain despite good conservative therapies such as home exercise regimen, formal physical therapy/dry needling, rest and use of medications. Patients clinical presentation and exam are consistent with myofascial pain, he does have tenderness upon palpation of bilateral trapezius, rhomboid and lumbar paraspinal regions. I also believe there could be a type of central sensitization syndrome such as fibromyalgia that could be working to exacerbate his pain. Patient states he does have issues with sleeping as well. If patients pain persists we feel obtaining rheumatological panel would be beneficial to rule out any secondary causes of pain. Patient encouraged to continue with home exercise regimen and physical therapy as tolerated.  No red flag symptoms noted upon exam today.    Meds & Orders: No orders of the defined types were placed in this encounter.   Orders Placed This Encounter  Procedures   Ambulatory referral to Physical Medicine Rehab    Follow-up: Return for Right L3-L4 interlaminar epidural steroid injection.   Procedures: No procedures performed      Clinical History: MRI LUMBAR SPINE WITHOUT CONTRAST   TECHNIQUE: Multiplanar, multisequence MR imaging of the lumbar spine was performed. No intravenous contrast was administered.   COMPARISON:  Plain films lumbar spine 11/10/2018.   FINDINGS: Segmentation: The patient has transitional lumbosacral anatomy. Numbering scheme on this study and is based on the lowest visible pair of ribs on the comparison plain films. Based on this numbering scheme, there is lumbarization of the first sacral segment on the right and a fully formed disc at the S1-2 level.   Alignment:  Maintained.   Vertebrae: No fracture, evidence of discitis or worrisome lesion. Hemangioma in L4 is noted.   Conus medullaris and cauda equina: Conus  extends to the L2 level. Conus and cauda equina appear normal.   Paraspinal and other soft tissues: Negative.   Disc levels:   L1-2: Mild facet arthropathy.  Otherwise negative.   L2-3: Mild facet degenerative change.  Otherwise negative.   L2-3: Shallow disc bulge, ligamentum flavum thickening and mild to moderate facet degenerative disease. No stenosis.   L3-4: Minimal disc bulge.  No stenosis.   L5-S1: Minimal disc bulge.  No stenosis.   S1-2: Transitional segment.  Negative.   IMPRESSION: Transitional lumbosacral anatomy. Please see numbering scheme above and correlate with plain films if any intervention is planned.   Mild lumbar degenerative change without central canal or foraminal narrowing. Scattered facet arthropathy is most notable at L3-4.     Electronically Signed   By: Drusilla Kanner M.D.   On: 10/10/2019 10:56   He reports that he quit smoking about 38 years ago. His smoking use included cigarettes. He started smoking about 49 years ago. He has a 5.00 pack-year smoking history. He quit smokeless tobacco use about 38 years ago.  His smokeless tobacco use included chew. No results for input(s): HGBA1C, LABURIC in the last 8760 hours.  Objective:  VS:  HT:    WT:   BMI:     BP:124/83  HR:81bpm  TEMP: ( )  RESP:  Physical Exam Vitals reviewed.  HENT:     Head: Normocephalic and atraumatic.     Right Ear: External ear normal.     Left Ear: External ear normal.     Nose: Nose normal.  Mouth/Throat:     Mouth: Mucous membranes are moist.  Eyes:     Extraocular Movements: Extraocular movements intact.  Cardiovascular:     Rate and Rhythm: Normal rate.     Pulses: Normal pulses.  Pulmonary:     Effort: Pulmonary effort is normal.  Abdominal:     General: Abdomen is flat. There is no distension.  Musculoskeletal:        General: Tenderness present.     Cervical back: Normal range of motion.     Comments: Pt rises from seated position to standing  without difficulty. Good lumbar range of motion. Strong distal strength without clonus, no pain upon palpation of greater trochanters. Sensation intact bilaterally. Palpable trigger points noted to bilateral trapezius, rhomboid and bilateral lumbar paraspinal areas. Walks independently, gait steady.    Skin:    General: Skin is warm and dry.     Capillary Refill: Capillary refill takes less than 2 seconds.  Neurological:     General: No focal deficit present.     Mental Status: He is alert and oriented to person, place, and time.  Psychiatric:        Mood and Affect: Mood normal.        Behavior: Behavior normal.    Ortho Exam  Imaging: No results found.  Past Medical/Family/Surgical/Social History: Medications & Allergies reviewed per EMR, new medications updated. Patient Active Problem List   Diagnosis Date Noted   Diarrhea 12/14/2021   Iron deficiency anemia 08/15/2021   Orthostatic hypotension 12/07/2020   IBS (irritable bowel syndrome) 11/17/2020   Chronic diarrhea 08/17/2020   NASH (nonalcoholic steatohepatitis) 08/17/2020   Elevated LFTs 08/17/2020   Tremor of both hands 05/06/2020   Bilateral occipital neuralgia 05/06/2020   Abdominal pain, chronic, epigastric 01/13/2019   Anxiety 01/04/2019   Radicular pain of left lower extremity 12/22/2018   Moderate persistent asthma 10/25/2018   Dizziness 07/12/2018   Nonintractable headache    Abnormal nuclear stress test    Aftercare following surgery 07/25/17 08/13/2017   Hx of colonic polyps 08/07/2017   Lateral epicondylitis of right elbow    Cervical nerve root impingement 12/09/2015   Generalized anxiety disorder 09/27/2015   Chest pain 09/20/2015   Pain in the chest    Depression 01/24/2015   Esophageal reflux 01/24/2015   Preoperative cardiovascular examination 06/28/2014   DM type 2 causing vascular disease (HCC)    Mixed hyperlipidemia    Obesity    Intermediate coronary syndrome (HCC) 12/09/2013   Lumbago  05/21/2012   Essential hypertension, benign 12/28/2009   CAD S/P LAD DES March 2015 12/28/2009   Hyperlipidemia LDL goal <70 11/25/2009   Accelerating angina (HCC) 11/25/2009   DM 11/24/2009   ABDOMINAL PAIN, HX OF 11/24/2009   Past Medical History:  Diagnosis Date   Anxiety    COPD (chronic obstructive pulmonary disease) (HCC)    Coronary atherosclerosis of native coronary artery    a. 12/09/2013: s/p PCI with 3.0 x 28 Promus DES to mLAD   Depression    Diabetic neuropathy (HCC)    Elbow injury 07/25/2017   Surgery for torn tendon   Essential hypertension    Fatty liver disease, nonalcoholic    GERD (gastroesophageal reflux disease)    Iron deficiency anemia 08/15/2021   Lateral epicondylitis of right elbow    Mixed hyperlipidemia    Obesity    Osgood-Schlatter's disease of right knee    Skin cyst 10/03/2017   Removed from back   Type  2 diabetes mellitus (HCC)    Family History  Problem Relation Age of Onset   Osteoporosis Mother    Cancer Maternal Aunt    CVA Maternal Grandmother    CVA Maternal Grandfather    Healthy Son    Past Surgical History:  Procedure Laterality Date   BIOPSY  01/21/2019   Procedure: BIOPSY;  Surgeon: Malissa Hippo, MD;  [facility];  Service: Endoscopy;;  gastric bx and gastric polyps   BIOPSY  11/08/2020   Procedure: BIOPSY;  Surgeon: Dolores Frame, MD;  [facility];  Service: Gastroenterology;;   CARDIAC CATHETERIZATION  2011   CARDIAC CATHETERIZATION N/A 09/20/2015   Procedure: Left Heart Cath and Coronary Angiography;  Surgeon: Kathleene Hazel, MD;  Location: [facility] INVASIVE CV LAB;  Service: Cardiovascular;  Laterality: N/A;   CHOLECYSTECTOMY  2003   COLONOSCOPY  06/19/2012   Procedure: COLONOSCOPY;  Surgeon: Malissa Hippo, MD;  [facility];  Service: Endoscopy;  Laterality: N/A;  930   COLONOSCOPY N/A 10/17/2017   Procedure: COLONOSCOPY;  Surgeon: Malissa Hippo, MD;  [facility];  Service: Endoscopy;  Laterality: N/A;  1225   COLONOSCOPY WITH PROPOFOL N/A 11/08/2020   Procedure: COLONOSCOPY WITH PROPOFOL;  Surgeon: Dolores Frame, MD;  [facility];  Service: Gastroenterology;  Laterality: N/A;  10:45   COLONOSCOPY WITH PROPOFOL N/A 04/04/2021   Procedure: COLONOSCOPY WITH PROPOFOL;  Surgeon: Dolores Frame, MD;  [facility];  Service: Gastroenterology;  Laterality: N/A;   CYST EXCISION  07/2019   ESOPHAGOGASTRODUODENOSCOPY (EGD) WITH PROPOFOL N/A 01/21/2019   Procedure: ESOPHAGOGASTRODUODENOSCOPY (EGD) WITH PROPOFOL;  Surgeon: Malissa Hippo, MD;  [facility];  Service: Endoscopy;  Laterality: N/A;  10:15am   ESOPHAGOGASTRODUODENOSCOPY (EGD) WITH PROPOFOL N/A 02/17/2021   Procedure: ESOPHAGOGASTRODUODENOSCOPY (EGD) WITH PROPOFOL;  Surgeon: Dolores Frame, MD;  [facility];  Service: Gastroenterology;  Laterality: N/A;  10:20   ESOPHAGOGASTRODUODENOSCOPY (EGD) WITH PROPOFOL N/A 04/04/2021   Procedure: ESOPHAGOGASTRODUODENOSCOPY (EGD) WITH PROPOFOL;  Surgeon: Dolores Frame, MD;  [facility];  Service: Gastroenterology;  Laterality: N/A;  12:30   GIVENS CAPSULE STUDY N/A 09/13/2021   Procedure: GIVENS CAPSULE STUDY;  Surgeon: Dolores Frame, MD;  [facility];  Service: Gastroenterology;  Laterality: N/A;  7:30   KNEE ARTHROPLASTY Right 1999   LEFT HEART CATH AND CORONARY ANGIOGRAPHY N/A 02/26/2018   Procedure: LEFT HEART CATH AND CORONARY ANGIOGRAPHY;  Surgeon: Corky Crafts, MD;  Location: [facility] INVASIVE CV LAB;  Service: Cardiovascular;  Laterality: N/A;   LEFT HEART CATHETERIZATION WITH CORONARY ANGIOGRAM N/A 12/09/2013   Procedure: LEFT HEART CATHETERIZATION WITH CORONARY ANGIOGRAM;  Surgeon: Corky Crafts, MD;  Location: [facility] CATH LAB;  Service: Cardiovascular;  Laterality: N/A;   Liver biopsy     POLYPECTOMY  10/17/2017    Procedure: POLYPECTOMY;  Surgeon: Malissa Hippo, MD;  [facility];  Service: Endoscopy;;  ascending colon, splenic flexure,sigmoid x2   POLYPECTOMY  11/08/2020   Procedure: POLYPECTOMY;  Surgeon: Dolores Frame, MD;  [facility];  Service: Gastroenterology;;   POLYPECTOMY  04/04/2021   Procedure: POLYPECTOMY;  Surgeon: Dolores Frame, MD;  [facility];  Service: Gastroenterology;;   TENNIS ELBOW RELEASE/NIRSCHEL PROCEDURE Right 07/25/2017   Procedure: TENNIS ELBOW RELEASE and debriedment;  Surgeon: Vickki Hearing, MD;  Location: [facility];  Service: Orthopedics;  Laterality: Right;  TONSILLECTOMY  1970's   Social History   Occupational History   Not on file  Tobacco Use   Smoking status: Former    Packs/day: 0.50    Years: 10.00    Pack years: 5.00    Types: Cigarettes    Start date: 02/02/1973    Quit date: 09/17/1983    Years since quitting: 38.3   Smokeless tobacco: Former    Types: Chew    Quit date: 09/17/1983  Vaping Use   Vaping Use: Never used  Substance and Sexual Activity   Alcohol use: No    Alcohol/week: 0.0 standard drinks   Drug use: No   Sexual activity: Yes

## 2022-01-22 NOTE — Telephone Encounter (Signed)
Pt had lab work ordered back in December. Advised pt to go ahead and have those done in the next few days; please see telephone call from 08/2021 ?

## 2022-01-22 NOTE — Progress Notes (Signed)
Numeric Pain Rating Scale and Functional Assessment ?Average Pain 6 ? ? ?In the last MONTH (on 0-10 scale) has pain interfered with the following? ? ?1. General activity like being  able to carry out your everyday physical activities such as walking, climbing stairs, carrying groceries, or moving a chair?  ?Rating(9) ? ?Patient is here for back pain. Previous injection given 2 months ago helped for a while. Doing PT twice a week. Takes Tramadol as needed for pain. Also takes Methocarbamol as needed. ?+Burning. Previous MRI and Xrays. Unable to do certain activities.  ? ? ? ?

## 2022-01-22 NOTE — Telephone Encounter (Signed)
Caller name:Trew Schrader  ? ?On DPR? :Yes ? ?Call back [phone] ? ?Provider they see: Wolfgang Phoenix ? ?Reason for call:Pt said at there last visit that he needed to have blood work done before next visit and there is none on lab site for [place] can you see if blood work needs to be ordered ? ?

## 2022-01-23 ENCOUNTER — Encounter (HOSPITAL_COMMUNITY): Payer: Medicare Other | Admitting: Physical Therapy

## 2022-01-24 ENCOUNTER — Encounter: Payer: Self-pay | Admitting: Cardiology

## 2022-01-24 ENCOUNTER — Ambulatory Visit: Payer: Medicare Other | Admitting: Cardiology

## 2022-01-24 VITALS — BP 112/80 | HR 84 | Ht 74.0 in | Wt 266.8 lb

## 2022-01-24 DIAGNOSIS — I25119 Atherosclerotic heart disease of native coronary artery with unspecified angina pectoris: Secondary | ICD-10-CM

## 2022-01-24 DIAGNOSIS — E782 Mixed hyperlipidemia: Secondary | ICD-10-CM | POA: Diagnosis not present

## 2022-01-24 NOTE — Progress Notes (Signed)
? ? ?Cardiology Office Note ? ?Date: 01/24/2022  ? ?ID: [name], DOB Apr 17, 1961, MRN [id number] ? ?PCP:  [name], MD  ?Cardiologist:  Rozann Lesches, MD ?Electrophysiologist:  None  ? ?Chief Complaint  ?Patient presents with  ? Cardiac follow-up  ? ? ?History of Present Illness: ?Jared Griffin is a 61 y.o. male last seen in November 2022.  He is here for a routine visit.  He has not experienced any angina symptoms or nitroglycerin use in the interim.  Reports having back pain and cervical disc disease, was seen by neurosurgeon and has plan for physical therapy at this point. ? ?I reviewed his medications which are stable from a cardiac perspective.  He will have lab work later this year at physical.  Last LDL was 71. ? ?He still follows with Dr. Wolfgang Phoenix. ? ?Past Medical History:  ?Diagnosis Date  ? Anxiety   ? COPD (chronic obstructive pulmonary disease) ([place])   ? Coronary atherosclerosis of native coronary artery   ? a. 12/09/2013: s/p PCI with 3.0 x 28 Promus DES to mLAD  ? Depression   ? Diabetic neuropathy ([place])   ? Elbow injury 07/25/2017  ? Surgery for torn tendon  ? Essential hypertension   ? Fatty liver disease, nonalcoholic   ? GERD (gastroesophageal reflux disease)   ? Iron deficiency anemia 08/15/2021  ? Lateral epicondylitis of right elbow   ? Mixed hyperlipidemia   ? Obesity   ? Osgood-Schlatter's disease of right knee   ? Skin cyst 10/03/2017  ? Removed from back  ? Type 2 diabetes mellitus ([place])   ? ? ?Past Surgical History:  ?Procedure Laterality Date  ? BIOPSY  01/21/2019  ? Procedure: BIOPSY;  Surgeon: [name], MD;  [facility];  Service: Endoscopy;;  gastric bx and gastric polyps  ? BIOPSY  11/08/2020  ? Procedure: BIOPSY;  Surgeon: [name], MD;  [facility];  Service: Gastroenterology;;  ? CARDIAC CATHETERIZATION  2011  ? CARDIAC CATHETERIZATION N/A 09/20/2015  ? Procedure: Left Heart Cath and Coronary Angiography;  Surgeon:  [name], MD;  Location: [place];  Service: Cardiovascular;  Laterality: N/A;  ? CHOLECYSTECTOMY  2003  ? COLONOSCOPY  06/19/2012  ? Procedure: COLONOSCOPY;  Surgeon: [name], MD;  [facility];  Service: Endoscopy;  Laterality: N/A;  930  ? COLONOSCOPY N/A 10/17/2017  ? Procedure: COLONOSCOPY;  Surgeon: [name], MD;  [facility];  Service: Endoscopy;  Laterality: N/A;  1225  ? COLONOSCOPY WITH PROPOFOL N/A 11/08/2020  ? Procedure: COLONOSCOPY WITH PROPOFOL;  Surgeon: [name], MD;  [facility];  Service: Gastroenterology;  Laterality: N/A;  10:45  ? COLONOSCOPY WITH PROPOFOL N/A 04/04/2021  ? Procedure: COLONOSCOPY WITH PROPOFOL;  Surgeon: [name], MD;  [facility];  Service: Gastroenterology;  Laterality: N/A;  ? CYST EXCISION  07/2019  ? ESOPHAGOGASTRODUODENOSCOPY (EGD) WITH PROPOFOL N/A 01/21/2019  ? Procedure: ESOPHAGOGASTRODUODENOSCOPY (EGD) WITH PROPOFOL;  Surgeon: [name], MD;  [facility];  Service: Endoscopy;  Laterality: N/A;  10:15am  ? ESOPHAGOGASTRODUODENOSCOPY (EGD) WITH PROPOFOL N/A 02/17/2021  ? Procedure: ESOPHAGOGASTRODUODENOSCOPY (EGD) WITH PROPOFOL;  Surgeon: [name], MD;  [facility];  Service: Gastroenterology;  Laterality: N/A;  10:20  ? ESOPHAGOGASTRODUODENOSCOPY (EGD) WITH PROPOFOL N/A 04/04/2021  ? Procedure: ESOPHAGOGASTRODUODENOSCOPY (EGD) WITH PROPOFOL;  Surgeon: [name], MD;  [facility];  Service: Gastroenterology;  Laterality: N/A;  12:30  ? GIVENS CAPSULE STUDY N/A 09/13/2021  ? Procedure: GIVENS CAPSULE STUDY;  Surgeon: [name], MD;  [facility];  Service: Gastroenterology;  Laterality: N/A;  7:30  ? KNEE ARTHROPLASTY Right 1999  ? LEFT HEART CATH AND CORONARY ANGIOGRAPHY N/A 02/26/2018  ? Procedure: LEFT HEART CATH AND CORONARY ANGIOGRAPHY;  Surgeon: [name], MD;  Location: [place] [facility];  Service: Cardiovascular;  Laterality: N/A;  ? LEFT HEART CATHETERIZATION WITH CORONARY ANGIOGRAM N/A 12/09/2013  ? Procedure: LEFT HEART CATHETERIZATION WITH CORONARY ANGIOGRAM;  Surgeon: [name], MD;  Location: [facility] CATH LAB;  Service: Cardiovascular;  Laterality: N/A;  ? Liver biopsy    ? POLYPECTOMY  10/17/2017  ? Procedure: POLYPECTOMY;  Surgeon: [name], MD;  [facility];  Service: Endoscopy;;  ascending colon, splenic flexure,sigmoid x2  ? POLYPECTOMY  11/08/2020  ? Procedure: POLYPECTOMY;  Surgeon: [name], MD;  [facility];  Service: Gastroenterology;;  ? POLYPECTOMY  04/04/2021  ? Procedure: POLYPECTOMY;  Surgeon: [name], MD;  [facility];  Service: Gastroenterology;;  ? TENNIS ELBOW RELEASE/NIRSCHEL PROCEDURE Right 07/25/2017  ? Procedure: TENNIS ELBOW RELEASE and debriedment;  Surgeon: [name], MD;  Location: [facility];  Service: Orthopedics;  Laterality: Right;  ? TONSILLECTOMY  1970's  ? ? ?Current Outpatient Medications  ?Medication Sig Dispense Refill  ? acetaminophen (TYLENOL) 500 MG tablet Take 1,000 mg by mouth every 8 (eight) hours as needed for moderate pain.    ? albuterol (VENTOLIN HFA) 108 (90 Base) MCG/ACT inhaler INHALE 2 PUFFS INTO LUNGS EVERY 6 HOURS AS NEEDED FOR WHEEZING AND SHORTNESS OF BREATH. (Patient taking differently: Inhale 2 puffs into the lungs every 6 (six) hours as needed for shortness of breath or wheezing. INHALE 2 PUFFS INTO LUNGS EVERY 6 HOURS AS NEEDED FOR WHEEZING AND SHORTNESS OF BREATH.) 8.5 g 5  ? ALPRAZolam (XANAX) 1 MG tablet Take 1 tablet (1 mg total) by mouth 3 (three) times daily as needed for anxiety. 90 tablet 3  ? aspirin 81 MG EC tablet Take 1 tablet (81 mg total) by mouth daily. 30 tablet 12  ? atorvastatin (LIPITOR) 40 MG tablet TAKE ONE TABLET (40MG TOTAL) BY MOUTH DAILY 90 tablet 1  ? B-D UF III MINI PEN NEEDLES  31G X 5 MM MISC USE UP TO 4 TIMES DAILY WITH INSULIN AS DIRECTED. 100 each 3  ? cetirizine (ZYRTEC) 10 MG tablet Take 1 tablet (10 mg total) by mouth daily. 30 tablet 0  ? colestipol (COLESTID) 1 g tablet Take 2 tablets (2 g total) by mouth daily. Take 4 hours apart from other medication (Patient taking differently: Take 2 g by mouth daily. Take 4 hours apart from other medication prn) 180 tablet 3  ? Continuous Blood Gluc Sensor (FREESTYLE LIBRE 14 DAY SENSOR) MISC Inject 1 each into the skin every 14 (fourteen) days. Use as directed. 2 each 2  ? dapagliflozin propanediol (FARXIGA) 5 MG TABS tablet Take 5 mg by mouth daily.    ? Doxylamine-Pyridoxine 10-10 MG TBEC Take 1 each by mouth every 12 (twelve) hours as needed (nausea). 60 tablet 3  ? fluticasone (FLONASE) 50 MCG/ACT nasal spray Place 2 sprays into both nostrils daily. (Patient taking differently: Place 2 sprays into both nostrils daily as needed for allergies.) 16 g 0  ? fluticasone-salmeterol (ADVAIR HFA) 115-21 MCG/ACT inhaler INHALE 2  PUFFS INTO THE LUNGS TWICE DAILY 12 g 1  ? gabapentin (NEURONTIN) 300 MG capsule 2 qam and 2qevening 120 capsule 6  ? HUMALOG KWIKPEN 200 UNIT/ML KwikPen Inject 30-35 Units into the skin 3 (three) times daily.    ? ipratropium (ATROVENT) 0.06 % nasal spray Place 2 sprays into both nostrils in the morning and at bedtime.    ? JANUVIA 100 MG tablet Take 100 mg by mouth daily.    ? Lancets MISC 1 each by Does not apply route 4 (four) times daily. 150 each 5  ? losartan (COZAAR) 25 MG tablet TAKE ONE (1) TABLET BY MOUTH EVERY DAY 90 tablet 3  ? methocarbamol (ROBAXIN) 500 MG tablet Take 500 mg by mouth every 8 (eight) hours as needed for muscle spasms.    ? nitroGLYCERIN (NITROSTAT) 0.4 MG SL tablet Place 1 tablet (0.4 mg total) under the tongue every 5 (five) minutes as needed. 25 tablet 3  ? Omega-3 Fatty Acids (FISH OIL) 1200 MG CAPS Take 2 capsules (2,400 mg total) by mouth 2 (two) times daily.    ? omeprazole (PRILOSEC)  40 MG capsule Take 1 capsule (40 mg total) by mouth daily. 90 capsule 3  ? ondansetron (ZOFRAN-ODT) 8 MG disintegrating tablet TAKE ONE TABLET (8MG TOTAL) BY MOUTH EVERY 8 HOURS AS NEEDED FRO NAUSEA OR VOMITING

## 2022-01-24 NOTE — Patient Instructions (Addendum)

## 2022-01-25 ENCOUNTER — Ambulatory Visit (HOSPITAL_COMMUNITY): Payer: Medicare Other | Attending: Physical Medicine and Rehabilitation | Admitting: Physical Therapy

## 2022-01-25 ENCOUNTER — Encounter (HOSPITAL_COMMUNITY): Payer: Self-pay | Admitting: Physical Therapy

## 2022-01-25 DIAGNOSIS — M5416 Radiculopathy, lumbar region: Secondary | ICD-10-CM | POA: Insufficient documentation

## 2022-01-25 DIAGNOSIS — M6281 Muscle weakness (generalized): Secondary | ICD-10-CM | POA: Diagnosis present

## 2022-01-25 DIAGNOSIS — R2689 Other abnormalities of gait and mobility: Secondary | ICD-10-CM | POA: Insufficient documentation

## 2022-01-25 DIAGNOSIS — M545 Low back pain, unspecified: Secondary | ICD-10-CM | POA: Diagnosis present

## 2022-01-25 NOTE — Therapy (Signed)
?OUTPATIENT PHYSICAL THERAPY TREATMENT NOTE ? ? ?Patient Name: Jared Griffin ?MRN: [id number] ?DOB:Nov 18, 1960, 61 y.o., male ?Today's Date: 01/25/2022 ?Progress Note ?Reporting Period 01/01/22 to 01/25/22 ? ?See note below for Objective Data and Assessment of Progress/Goals.  ? ? ? ?PCP: [name], MD ?REFERRING PROVIDER: Lorine Bears, NP  ? ?END OF SESSION:  ? PT End of Session - 01/25/22 0902   ? ? Visit Number 7   ? Number of Visits 15   ? Date for PT Re-Evaluation 02/22/22   ? Authorization Type [employer] Medicare (no auth, no VL)   ? Progress Note Due on Visit 15   ? PT Start Time 0900   ? PT Stop Time [phone]   ? PT Time Calculation (min) 42 min   ? Activity Tolerance Patient tolerated treatment well   ? Behavior During Therapy [facility] for tasks assessed/performed   ? ?  ?  ? ?  ? ? ? ?Past Medical History:  ?Diagnosis Date  ? Anxiety   ? COPD (chronic obstructive pulmonary disease) ([place])   ? Coronary atherosclerosis of native coronary artery   ? a. 12/09/2013: s/p PCI with 3.0 x 28 Promus DES to mLAD  ? Depression   ? Diabetic neuropathy ([place])   ? Elbow injury 07/25/2017  ? Surgery for torn tendon  ? Essential hypertension   ? Fatty liver disease, nonalcoholic   ? GERD (gastroesophageal reflux disease)   ? Iron deficiency anemia 08/15/2021  ? Lateral epicondylitis of right elbow   ? Mixed hyperlipidemia   ? Obesity   ? Osgood-Schlatter's disease of right knee   ? Skin cyst 10/03/2017  ? Removed from back  ? Type 2 diabetes mellitus ([place])   ? ?Past Surgical History:  ?Procedure Laterality Date  ? BIOPSY  01/21/2019  ? Procedure: BIOPSY;  Surgeon: [name], MD;  [facility];  Service: Endoscopy;;  gastric bx and gastric polyps  ? BIOPSY  11/08/2020  ? Procedure: BIOPSY;  Surgeon: [name], MD;  [facility];  Service: Gastroenterology;;  ? CARDIAC CATHETERIZATION  2011  ? CARDIAC CATHETERIZATION N/A 09/20/2015  ? Procedure: Left Heart Cath and Coronary Angiography;   Surgeon: [name], MD;  Location: [place] [facility];  Service: Cardiovascular;  Laterality: N/A;  ? CHOLECYSTECTOMY  2003  ? COLONOSCOPY  06/19/2012  ? Procedure: COLONOSCOPY;  Surgeon: [name], MD;  [facility];  Service: Endoscopy;  Laterality: N/A;  930  ? COLONOSCOPY N/A 10/17/2017  ? Procedure: COLONOSCOPY;  Surgeon: [name], MD;  [facility];  Service: Endoscopy;  Laterality: N/A;  1225  ? COLONOSCOPY WITH PROPOFOL N/A 11/08/2020  ? Procedure: COLONOSCOPY WITH PROPOFOL;  Surgeon: [name], MD;  [facility];  Service: Gastroenterology;  Laterality: N/A;  10:45  ? COLONOSCOPY WITH PROPOFOL N/A 04/04/2021  ? Procedure: COLONOSCOPY WITH PROPOFOL;  Surgeon: [name], MD;  [facility];  Service: Gastroenterology;  Laterality: N/A;  ? CYST EXCISION  07/2019  ? ESOPHAGOGASTRODUODENOSCOPY (EGD) WITH PROPOFOL N/A 01/21/2019  ? Procedure: ESOPHAGOGASTRODUODENOSCOPY (EGD) WITH PROPOFOL;  Surgeon: [name], MD;  [facility];  Service: Endoscopy;  Laterality: N/A;  10:15am  ? ESOPHAGOGASTRODUODENOSCOPY (EGD) WITH PROPOFOL N/A 02/17/2021  ? Procedure: ESOPHAGOGASTRODUODENOSCOPY (EGD) WITH PROPOFOL;  Surgeon: [name], MD;  [facility];  Service: Gastroenterology;  Laterality: N/A;  10:20  ? ESOPHAGOGASTRODUODENOSCOPY (EGD) WITH PROPOFOL  N/A 04/04/2021  ? Procedure: ESOPHAGOGASTRODUODENOSCOPY (EGD) WITH PROPOFOL;  Surgeon: [name], MD;  [facility];  Service: Gastroenterology;  Laterality: N/A;  12:30  ? GIVENS CAPSULE STUDY N/A 09/13/2021  ? Procedure: GIVENS CAPSULE STUDY;  Surgeon: [name], MD;  [facility];  Service: Gastroenterology;  Laterality: N/A;  7:30  ? KNEE ARTHROPLASTY Right 1999  ? LEFT HEART CATH AND CORONARY ANGIOGRAPHY N/A 02/26/2018  ? Procedure: LEFT HEART CATH AND CORONARY ANGIOGRAPHY;  Surgeon:  [name], MD;  Location: [place] [facility];  Service: Cardiovascular;  Laterality: N/A;  ? LEFT HEART CATHETERIZATION WITH CORONARY ANGIOGRAM N/A 12/09/2013  ? Procedure: LEFT HEART CATHETERIZATION WITH CORONARY ANGIOGRAM;  Surgeon: [name], MD;  Location[facility];  Service: Cardiovascular;  Laterality: N/A;  ? Liver biopsy    ? POLYPECTOMY  10/17/2017  ? Procedure: POLYPECTOMY;  Surgeon: [name], MD;  [facility];  Service: Endoscopy;;  ascending colon, splenic flexure,sigmoid x2  ? POLYPECTOMY  11/08/2020  ? Procedure: POLYPECTOMY;  Surgeon: [name], MD;  [facility];  Service: Gastroenterology;;  ? POLYPECTOMY  04/04/2021  ? Procedure: POLYPECTOMY;  Surgeon: [name], MD;  [facility];  Service: Gastroenterology;;  ? TENNIS ELBOW RELEASE/NIRSCHEL PROCEDURE Right 07/25/2017  ? Procedure: TENNIS ELBOW RELEASE and debriedment;  Surgeon: [name], MD;  Location: [facility];  Service: Orthopedics;  Laterality: Right;  ? TONSILLECTOMY  1970's  ? ?Patient Active Problem List  ? Diagnosis Date Noted  ? Diarrhea 12/14/2021  ? Iron deficiency anemia 08/15/2021  ? Orthostatic hypotension 12/07/2020  ? IBS (irritable bowel syndrome) 11/17/2020  ? Chronic diarrhea 08/17/2020  ? NASH (nonalcoholic steatohepatitis) 08/17/2020  ? Elevated LFTs 08/17/2020  ? Tremor of both hands 05/06/2020  ? Bilateral occipital neuralgia 05/06/2020  ? Abdominal pain, chronic, epigastric 01/13/2019  ? Anxiety 01/04/2019  ? Radicular pain of left lower extremity 12/22/2018  ? Moderate persistent asthma 10/25/2018  ? Dizziness 07/12/2018  ? Nonintractable headache   ? Abnormal nuclear stress test   ? Aftercare following surgery 07/25/17 08/13/2017  ? Hx of colonic polyps 08/07/2017  ? Lateral epicondylitis of right elbow   ? Cervical nerve root impingement 12/09/2015  ? Generalized anxiety disorder 09/27/2015  ? Chest pain 09/20/2015  ?  Pain in the chest   ? Depression 01/24/2015  ? Esophageal reflux 01/24/2015  ? Preoperative cardiovascular examination 06/28/2014  ? DM type 2 causing vascular disease ([place])   ? Mixed hyperlipidemia   ? Obesity   ? Intermediate coronary syndrome ([place]) 12/09/2013  ? Lumbago 05/21/2012  ? Essential hypertension, benign 12/28/2009  ? CAD S/P LAD DES March 2015 12/28/2009  ? Hyperlipidemia LDL goal <70 11/25/2009  ? Accelerating angina ([place]) 11/25/2009  ? DM 11/24/2009  ? ABDOMINAL PAIN, HX OF 11/24/2009  ? ? ?REFERRING DIAG: M47.816 (ICD-10-CM) - Spondylosis without myelopathy or radiculopathy, lumbar region M54.50,G89.29 (ICD-10-CM) - Chronic bilateral low back pain without sciatica M47.816 (ICD-10-CM) - Facet hypertrophy of lumbar region M79.18 (ICD-10-CM) - Myofascial pain syndrome  ? ?THERAPY DIAG:  ?Low back pain, unspecified back pain laterality, unspecified chronicity, unspecified whether sciatica present ? ?Radiculopathy, lumbar region ? ?PERTINENT HISTORY: Chronic back pain ? ?PRECAUTIONS: None ? ?SUBJECTIVE: Patient states he is doing better. Pain in upper back is improved. Low back is still tight, especially bending forward. He had an MRI on his neck, and will likely be having referral for  this. He does reports 60% improvement in lumbar sx since starting therapy.  ? ?PAIN:  ?Are you having pain? Yes: NPRS scale: 5/10 ?Pain location: across low lumbar   ?Pain description: aching, sore ?Aggravating factors: "nothing in particular" ?Relieving factors: Unsure  ? ?OBJECTIVE:  ?  ?  ?POSTURE:  ?Forward head ?  ?PALPATION: ?Mod TTP about bilateral lumbar paraspinals  ?  ?LUMBAR ROM:  ?  ?Active  A/PROM  ?12/14/2021 AROM ?12/26/21 AROM  ?01/01/22 AROM  ?01/25/22  ?Flexion 50% limited* 50% limited* 25% limited  10% limited   ?Extension 50% limited* 50% limited * 50% limited * 30% limited  ?Right lateral flexion 25% limited*     ?Left lateral flexion 25% limited*     ?Right rotation 25% limited     ?Left rotation 25%  limited     ? (Blank rows = not tested) ?  ?  ?LE MMT: ?  ?MMT Right ?12/14/2021 Right ?01/25/2022 Left ?12/14/2021 Left ?01/25/2022  ?Hip flexion 4+/5 5/5 4+/5 5/5  ?Hip extension 4/5 4+/5 4/5 4+/5  ?Hip abduction 4/

## 2022-01-26 ENCOUNTER — Other Ambulatory Visit: Payer: Self-pay | Admitting: Family Medicine

## 2022-01-26 MED ORDER — SILDENAFIL CITRATE 100 MG PO TABS: 50.0000 mg | ORAL_TABLET | Freq: Every day | ORAL | 11 refills | Status: DC | PRN

## 2022-02-05 NOTE — Progress Notes (Unsigned)
Assessment/Plan:    1.  Tremor  -DaTscan previously done and was negative.  -No functional qualities today.  -He will continue on propranolol, 20 mg twice per day.  Told him he could take an extra if needed, if he was going out to dinner with friends and he had more tremor.  Medication was refilled for 1 year.  After that, he will follow-up with primary care for refills, since he is doing well.  Subjective:   Jared Griffin was seen today in follow up for tremor.  My previous records were reviewed prior to todays visit.  Last visit his bisoprolol was discontinued.  We instead started propranolol, 20 mg twice per day.  Cardiology did not want him on primidone because of interaction with Ranexa.  Records are reviewed since last visit.  Following with psychiatry.  Reports that he is doing much better.  He is on Xanax up to 3 times per day in addition to his sertraline, 150 mg daily and trazodone, 50 mg at bedtime.  Patient has been following with Dr. Saintclair Halsted for neck pain.  Pt states that tremor comes and goes.  If out to eat, it may happen and then will be gone.  No side effects with medication.  Meds that may affect tremor:  xanax 1 mg tid; gabapentin; albuterol; atrovent   Previous meds: (Cannot take primidone due to interaction with Ranexa)  ALLERGIES:   Allergies  Allergen Reactions   Doxycycline     Severe esophagitis    CURRENT MEDICATIONS:  Outpatient Encounter Medications as of 02/07/2022  Medication Sig   acetaminophen (TYLENOL) 500 MG tablet Take 1,000 mg by mouth every 8 (eight) hours as needed for moderate pain.   albuterol (VENTOLIN HFA) 108 (90 Base) MCG/ACT inhaler INHALE 2 PUFFS INTO LUNGS EVERY 6 HOURS AS NEEDED FOR WHEEZING AND SHORTNESS OF BREATH. (Patient taking differently: Inhale 2 puffs into the lungs every 6 (six) hours as needed for shortness of breath or wheezing. INHALE 2 PUFFS INTO LUNGS EVERY 6 HOURS AS NEEDED FOR WHEEZING AND SHORTNESS OF BREATH.)    ALPRAZolam (XANAX) 1 MG tablet Take 1 tablet (1 mg total) by mouth 3 (three) times daily as needed for anxiety.   aspirin 81 MG EC tablet Take 1 tablet (81 mg total) by mouth daily.   atorvastatin (LIPITOR) 40 MG tablet TAKE ONE TABLET (40MG TOTAL) BY MOUTH DAILY   B-D UF III MINI PEN NEEDLES 31G X 5 MM MISC USE UP TO 4 TIMES DAILY WITH INSULIN AS DIRECTED.   cetirizine (ZYRTEC) 10 MG tablet Take 1 tablet (10 mg total) by mouth daily.   colestipol (COLESTID) 1 g tablet Take 2 tablets (2 g total) by mouth daily. Take 4 hours apart from other medication (Patient taking differently: Take 2 g by mouth daily. Take 4 hours apart from other medication prn)   Continuous Blood Gluc Sensor (FREESTYLE LIBRE 14 DAY SENSOR) MISC Inject 1 each into the skin every 14 (fourteen) days. Use as directed.   dapagliflozin propanediol (FARXIGA) 5 MG TABS tablet Take 5 mg by mouth daily.   Doxylamine-Pyridoxine 10-10 MG TBEC Take 1 each by mouth every 12 (twelve) hours as needed (nausea).   fluticasone (FLONASE) 50 MCG/ACT nasal spray Place 2 sprays into both nostrils daily. (Patient taking differently: Place 2 sprays into both nostrils daily as needed for allergies.)   fluticasone-salmeterol (ADVAIR HFA) 115-21 MCG/ACT inhaler INHALE 2 PUFFS INTO THE LUNGS TWICE DAILY   gabapentin (NEURONTIN) 300 MG  capsule 2 qam and 2qevening   HUMALOG KWIKPEN 200 UNIT/ML KwikPen Inject 30-35 Units into the skin 3 (three) times daily.   ipratropium (ATROVENT) 0.06 % nasal spray Place 2 sprays into both nostrils in the morning and at bedtime.   Lancets MISC 1 each by Does not apply route 4 (four) times daily.   losartan (COZAAR) 25 MG tablet TAKE ONE (1) TABLET BY MOUTH EVERY DAY   methocarbamol (ROBAXIN) 500 MG tablet Take 500 mg by mouth every 8 (eight) hours as needed for muscle spasms.   nitroGLYCERIN (NITROSTAT) 0.4 MG SL tablet Place 1 tablet (0.4 mg total) under the tongue every 5 (five) minutes as needed.   Omega-3 Fatty Acids  (FISH OIL) 1200 MG CAPS Take 2 capsules (2,400 mg total) by mouth 2 (two) times daily.   omeprazole (PRILOSEC) 40 MG capsule Take 1 capsule (40 mg total) by mouth daily.   ondansetron (ZOFRAN-ODT) 8 MG disintegrating tablet TAKE ONE TABLET (8MG TOTAL) BY MOUTH EVERY 8 HOURS AS NEEDED FRO NAUSEA OR VOMITING   propranolol (INDERAL) 20 MG tablet TAKE ONE TABLET (20MG TOTAL) BY MOUTH TWO TIMES DAILY   ranolazine (RANEXA) 1000 MG SR tablet TAKE ONE TABLET (1000MG TOTAL) BY MOUTH TWO TIMES DAILY   Semaglutide,0.25 or 0.5MG/DOS, (OZEMPIC, 0.25 OR 0.5 MG/DOSE,) 2 MG/3ML SOPN See admin instructions.   sertraline (ZOLOFT) 100 MG tablet Take 1.5 tablets (150 mg total) by mouth daily.   sildenafil (VIAGRA) 100 MG tablet Take 0.5-1 tablets (50-100 mg total) by mouth daily as needed for erectile dysfunction.   TOUJEO MAX SOLOSTAR 300 UNIT/ML Solostar Pen INJECT 130 UNITS INTO THE SKIN AT BEDTIME. (Patient taking differently: Inject 40-50 Units into the skin at bedtime.)   traZODone (DESYREL) 50 MG tablet Take 1 tablet (50 mg total) by mouth at bedtime as needed.   JANUVIA 100 MG tablet Take 100 mg by mouth daily.   [DISCONTINUED] sildenafil (VIAGRA) 100 MG tablet Take 0.5-1 tablets (50-100 mg total) by mouth daily as needed for erectile dysfunction.   No facility-administered encounter medications on file as of 02/07/2022.     Objective:    PHYSICAL EXAMINATION:    VITALS:   Vitals:   02/07/22 1107  BP: 124/78  Pulse: 100  SpO2: 98%  Weight: 267 lb (121.1 kg)  Height: 6' 2"  (1.88 m)     GEN:  The patient appears stated age and is in NAD. HEENT:  Normocephalic, atraumatic.  The mucous membranes are moist. The superficial temporal arteries are without ropiness or tenderness. CV:  tachy.  regular Lungs:  CTAB Neck/HEME:  There are no carotid bruits bilaterally.  Neurological examination:  Orientation: The patient is alert and oriented x3. Cranial nerves: There is good facial symmetry. The  speech is fluent and clear. Soft palate rises symmetrically and there is no tongue deviation. Hearing is intact to conversational tone. Sensation: Sensation is intact to light touch throughout Motor: Strength is at least antigravity x4.  Movement examination: Tone: Tone is normal. Abnormal movements: Minimal postural tremor.  Minimal intention tremor.  Some tremor with Archimedes spirals on the right. Coordination:  There is no decremation today with rapid alternating movements.  I have reviewed and interpreted the following labs independently   Chemistry      Component Value Date/Time   NA 135 06/20/2021 0839   NA 137 05/25/2021 0958   K 3.9 06/20/2021 0839   CL 101 06/20/2021 0839   CO2 24 06/20/2021 0839   BUN 14 06/20/2021  0839   BUN 15 05/25/2021 0958   CREATININE 0.91 06/20/2021 0839   CREATININE 0.93 08/17/2020 1512      Component Value Date/Time   CALCIUM 9.4 06/20/2021 0839   ALKPHOS 46 06/20/2021 0839   AST 45 (H) 06/20/2021 0839   ALT 49 (H) 06/20/2021 0839   BILITOT 0.8 06/20/2021 0839   BILITOT 0.5 05/25/2021 0958      Lab Results  Component Value Date   WBC 6.8 12/06/2021   HGB 16.8 12/06/2021   HCT 50.1 12/06/2021   MCV 89.0 12/06/2021   PLT 150 12/06/2021   Lab Results  Component Value Date   TSH 0.648 08/20/2019     Chemistry      Component Value Date/Time   NA 135 06/20/2021 0839   NA 137 05/25/2021 0958   K 3.9 06/20/2021 0839   CL 101 06/20/2021 0839   CO2 24 06/20/2021 0839   BUN 14 06/20/2021 0839   BUN 15 05/25/2021 0958   CREATININE 0.91 06/20/2021 0839   CREATININE 0.93 08/17/2020 1512      Component Value Date/Time   CALCIUM 9.4 06/20/2021 0839   ALKPHOS 46 06/20/2021 0839   AST 45 (H) 06/20/2021 0839   ALT 49 (H) 06/20/2021 0839   BILITOT 0.8 06/20/2021 0839   BILITOT 0.5 05/25/2021 0958         Total time spent on today's visit was 20 minutes, including both face-to-face time and nonface-to-face time.  Time included  that spent on review of records (prior notes available to me/labs/imaging if pertinent), discussing treatment and goals, answering patient's questions and coordinating care.  Cc:  [name], MD

## 2022-02-07 ENCOUNTER — Ambulatory Visit: Payer: Medicare Other | Admitting: Neurology

## 2022-02-07 ENCOUNTER — Ambulatory Visit (HOSPITAL_COMMUNITY): Payer: Medicare Other | Admitting: Physical Therapy

## 2022-02-07 ENCOUNTER — Encounter (HOSPITAL_COMMUNITY): Payer: Self-pay | Admitting: Physical Therapy

## 2022-02-07 ENCOUNTER — Encounter: Payer: Self-pay | Admitting: Neurology

## 2022-02-07 ENCOUNTER — Other Ambulatory Visit: Payer: Self-pay | Admitting: *Deleted

## 2022-02-07 DIAGNOSIS — M6281 Muscle weakness (generalized): Secondary | ICD-10-CM

## 2022-02-07 DIAGNOSIS — M545 Low back pain, unspecified: Secondary | ICD-10-CM

## 2022-02-07 DIAGNOSIS — R2689 Other abnormalities of gait and mobility: Secondary | ICD-10-CM

## 2022-02-07 DIAGNOSIS — R251 Tremor, unspecified: Secondary | ICD-10-CM

## 2022-02-07 DIAGNOSIS — M542 Cervicalgia: Secondary | ICD-10-CM | POA: Diagnosis not present

## 2022-02-07 MED ORDER — SILDENAFIL CITRATE 100 MG PO TABS: 50.0000 mg | ORAL_TABLET | Freq: Every day | ORAL | 11 refills | Status: DC | PRN

## 2022-02-07 MED ORDER — PROPRANOLOL HCL 20 MG PO TABS: 20.0000 mg | ORAL_TABLET | Freq: Two times a day (BID) | ORAL | 3 refills | Status: DC

## 2022-02-07 NOTE — Therapy (Signed)
OUTPATIENT PHYSICAL THERAPY TREATMENT NOTE   Patient Name: Jared Griffin MRN: [id number] DOB:Jul 10, 1961, 61 y.o., male Today's Date: 02/07/2022    PCP: [name], MD REFERRING PROVIDER: Lorine Bears, NP   END OF SESSION:   PT End of Session - 02/07/22 0828     Visit Number 8    Number of Visits 15    Date for PT Re-Evaluation 02/22/22    Authorization Type U[employer] Medicare (no auth, no VL)    Progress Note Due on Visit 15    PT Start Time 0830    PT Stop Time 0913    PT Time Calculation (min) 43 min    Activity Tolerance Patient tolerated treatment well    Behavior During Therapy [facility] for tasks assessed/performed              Past Medical History:  Diagnosis Date   Anxiety    COPD (chronic obstructive pulmonary disease) ([place])    Coronary atherosclerosis of native coronary artery    a. 12/09/2013: s/p PCI with 3.0 x 28 Promus DES to mLAD   Depression    Diabetic neuropathy ([place])    Elbow injury 07/25/2017   Surgery for torn tendon   Essential hypertension    Fatty liver disease, nonalcoholic    GERD (gastroesophageal reflux disease)    Iron deficiency anemia 08/15/2021   Lateral epicondylitis of right elbow    Mixed hyperlipidemia    Obesity    Osgood-Schlatter's disease of right knee    Skin cyst 10/03/2017   Removed from back   Type 2 diabetes mellitus ([place])    Past Surgical History:  Procedure Laterality Date   BIOPSY  01/21/2019   Procedure: BIOPSY;  Surgeon: [name], MD;  [facility];  Service: Endoscopy;;  gastric bx and gastric polyps   BIOPSY  11/08/2020   Procedure: BIOPSY;  Surgeon: [name], MD;  [facility];  Service: Gastroenterology;;   CARDIAC CATHETERIZATION  2011   CARDIAC CATHETERIZATION N/A 09/20/2015   Procedure: Left Heart Cath and Coronary Angiography;  Surgeon: [name], MD;  Location: [place] [facility];  Service: Cardiovascular;  Laterality: N/A;   CHOLECYSTECTOMY   2003   COLONOSCOPY  06/19/2012   Procedure: COLONOSCOPY;  Surgeon: [name], MD;  [facility];  Service: Endoscopy;  Laterality: N/A;  930   COLONOSCOPY N/A 10/17/2017   Procedure: COLONOSCOPY;  Surgeon: [name], MD;  [facility];  Service: Endoscopy;  Laterality: N/A;  1225   COLONOSCOPY WITH PROPOFOL N/A 11/08/2020   Procedure: COLONOSCOPY WITH PROPOFOL;  Surgeon: [name], MD;  [facility];  Service: Gastroenterology;  Laterality: N/A;  10:45   COLONOSCOPY WITH PROPOFOL N/A 04/04/2021   Procedure: COLONOSCOPY WITH PROPOFOL;  Surgeon: [name], MD;  [facility];  Service: Gastroenterology;  Laterality: N/A;   CYST EXCISION  07/2019   ESOPHAGOGASTRODUODENOSCOPY (EGD) WITH PROPOFOL N/A 01/21/2019   Procedure: ESOPHAGOGASTRODUODENOSCOPY (EGD) WITH PROPOFOL;  Surgeon: [name], MD;  [facility];  Service: Endoscopy;  Laterality: N/A;  10:15am   ESOPHAGOGASTRODUODENOSCOPY (EGD) WITH PROPOFOL N/A 02/17/2021   Procedure: ESOPHAGOGASTRODUODENOSCOPY (EGD) WITH PROPOFOL;  Surgeon: [name], MD;  [facility];  Service: Gastroenterology;  Laterality: N/A;  10:20   ESOPHAGOGASTRODUODENOSCOPY (EGD) WITH PROPOFOL N/A 04/04/2021   Procedure: ESOPHAGOGASTRODUODENOSCOPY (EGD) WITH PROPOFOL;  Surgeon: [name], MD;  [facility];  Service: Gastroenterology;  Laterality: N/A;  12:30   GIVENS CAPSULE STUDY N/A 09/13/2021   Procedure: GIVENS CAPSULE STUDY;  Surgeon: [name], MD;  [facility];  Service: Gastroenterology;  Laterality: N/A;  7:30   KNEE ARTHROPLASTY Right 1999   LEFT HEART CATH AND CORONARY ANGIOGRAPHY N/A 02/26/2018   Procedure: LEFT HEART CATH AND CORONARY ANGIOGRAPHY;  Surgeon: [name], MD;  Location: [place] [facility];  Service: Cardiovascular;  Laterality: N/A;   LEFT HEART CATHETERIZATION  WITH CORONARY ANGIOGRAM N/A 12/09/2013   Procedure: LEFT HEART CATHETERIZATION WITH CORONARY ANGIOGRAM;  Surgeon: [name], MD;  Location: [facility];  Service: Cardiovascular;  Laterality: N/A;   Liver biopsy     POLYPECTOMY  10/17/2017   Procedure: POLYPECTOMY;  Surgeon: [name], MD;  [facility];  Service: Endoscopy;;  ascending colon, splenic flexure,sigmoid x2   POLYPECTOMY  11/08/2020   Procedure: POLYPECTOMY;  Surgeon: [name], MD;  [facility];  Service: Gastroenterology;;   POLYPECTOMY  04/04/2021   Procedure: POLYPECTOMY;  Surgeon: [name], MD;  [facility];  Service: Gastroenterology;;   TENNIS ELBOW RELEASE/NIRSCHEL PROCEDURE Right 07/25/2017   Procedure: TENNIS ELBOW RELEASE and debriedment;  Surgeon: [name], MD;  Location: [facility];  Service: Orthopedics;  Laterality: Right;   TONSILLECTOMY  1970's   Patient Active Problem List   Diagnosis Date Noted   Diarrhea 12/14/2021   Iron deficiency anemia 08/15/2021   Orthostatic hypotension 12/07/2020   IBS (irritable bowel syndrome) 11/17/2020   Chronic diarrhea 08/17/2020   NASH (nonalcoholic steatohepatitis) 08/17/2020   Elevated LFTs 08/17/2020   Tremor of both hands 05/06/2020   Bilateral occipital neuralgia 05/06/2020   Abdominal pain, chronic, epigastric 01/13/2019   Anxiety 01/04/2019   Radicular pain of left lower extremity 12/22/2018   Moderate persistent asthma 10/25/2018   Dizziness 07/12/2018   Nonintractable headache    Abnormal nuclear stress test    Aftercare following surgery 07/25/17 08/13/2017   Hx of colonic polyps 08/07/2017   Lateral epicondylitis of right elbow    Cervical nerve root impingement 12/09/2015   Generalized anxiety disorder 09/27/2015   Chest pain 09/20/2015   Pain in the chest    Depression 01/24/2015   Esophageal reflux 01/24/2015   Preoperative cardiovascular examination 06/28/2014    DM type 2 causing vascular disease ([place])    Mixed hyperlipidemia    Obesity    Intermediate coronary syndrome ([place]) 12/09/2013   Lumbago 05/21/2012   Essential hypertension, benign 12/28/2009   CAD S/P LAD DES March 2015 12/28/2009   Hyperlipidemia LDL goal <70 11/25/2009   Accelerating angina ([place]) 11/25/2009   DM 11/24/2009   ABDOMINAL PAIN, HX OF 11/24/2009    REFERRING DIAG: M47.816 (ICD-10-CM) - Spondylosis without myelopathy or radiculopathy, lumbar region M54.50,G89.29 (ICD-10-CM) - Chronic bilateral low back pain without sciatica M47.816 (ICD-10-CM) - Facet hypertrophy of lumbar region M79.18 (ICD-10-CM) - Myofascial pain syndrome   THERAPY DIAG:  Low back pain, unspecified back pain laterality, unspecified chronicity, unspecified whether sciatica present  Other abnormalities of gait and mobility  Muscle weakness (generalized)  PERTINENT HISTORY: Chronic back pain  PRECAUTIONS: None  SUBJECTIVE: Back is doing pretty good. He hasn't been having too many issues. Has not had a lot of trouble since last visit and feel like needling has helped. Pain hasn't been there but has to watch the way he has to move. Feels like a pressure  in his back but not hurting.   PAIN:  Are you having pain? Yes: NPRS scale: 0/10 Pain location: across low lumbar   Pain description: aching, sore Aggravating factors: "nothing in particular" Relieving factors: Unsure   OBJECTIVE:      POSTURE:  Forward head   PALPATION: Mod TTP about bilateral lumbar paraspinals    LUMBAR ROM:    Active  A/PROM  12/14/2021 AROM 12/26/21 AROM  01/01/22 AROM  01/25/22  Flexion 50% limited* 50% limited* 25% limited  10% limited   Extension 50% limited* 50% limited * 50% limited * 30% limited  Right lateral flexion 25% limited*     Left lateral flexion 25% limited*     Right rotation 25% limited     Left rotation 25% limited      (Blank rows = not tested)     LE MMT:   MMT Right 12/14/2021  Right 01/25/2022 Left 12/14/2021 Left 01/25/2022  Hip flexion 4+/5 5/5 4+/5 5/5  Hip extension 4/5 4+/5 4/5 4+/5  Hip abduction 4/5 5/5 4+/5 4/5  Hip adduction        Hip internal rotation        Hip external rotation        Knee flexion 5/5 5/5 5/5 5/5  Knee extension 5/5 5/5 5/5 5/5  Ankle dorsiflexion 5/5 5/5 5/5 5/5  Ankle plantarflexion        Ankle inversion        Ankle eversion         (Blank rows = not tested)   LUMBAR SPECIAL TESTS:  (+) Sacral compression    FUNCTIONAL TESTS:  2 minute walk test: 452 feet no AD    TODAY'S TREATMENT  02/07/22  DRY NEEDLING: Dry needling consent given? yes Educational handouts provided? Previously given  Muscles treated: Lower R lumbar multifidi  Response from dry needling: good tolerance   Manual STM to bilateral lumbar paraspinals pre and post dry needling for trigger point identification and surface area preparation   DKTC with heels on green ball 10 x 5 second holds; 2 sets Prone hip extension 2x 10 bilateral     01/25/22 Reassess FOTO 49%   DRY NEEDLING: Dry needling consent given? yes Educational handouts provided? Previously given  Muscles treated: Bilateral lumbar paraspinals  Response from dry needling: good tolerance   Manual STM to bilateral lumbar paraspinals pre and post dry needling for trigger point identification and surface area preparation   01/11/22  Bridge 15 x5"  Piriformis stretch 3 x30"  LTR 10 x 5"  Ab march x20  SLR with ab brace 2 x 10 each  Deadbugs x10    Manual IASTM to bilateral lumbar paraspinals using theragun lv 10   01/09/22 Prone on elbows 3 min (maybe more pain in low back, none in legs) Prone press up 2 x 10 (improved/ decreased pain)  Piriformis stretch 3 x20" Active hamstring stretch x20  Bridge 10 x 5"    DRY NEEDLING: Dry needling consent given? yes Educational handouts provided? yes Muscles treated: RT lumbar paraspinals Response from dry needling: good  tolerance  Manual IASTM to bilateral lumbar paraspinals using theragun lv 10 and manual STM pre and post dry needling for trigger point identification and surface area preparation   01/01/22 DRY NEEDLING: Dry needling consent given? yes Educational handouts provided? yes Muscles treated: bilateral thoracic paraspinals T7-T8, lumbar paraspinals L2-3 Response from dry needling: good tolerance, several small twitches in lumbar  Manual STM to bilateral  thoracic and lumbar paraspinals pre and post dry needling for trigger point identification and surface area preparation   Piriformis stretch 3 x 20" each Modified open book stretch 5 x 10" each     PATIENT EDUCATION:  Education details: exercise form and function Person educated: Patient Education method: Explanation, Demonstration, and Handouts Education comprehension: verbalized understanding, returned demonstration, verbal cues required, and tactile cues required       HOME EXERCISE PROGRAM: 01/11/22 Ab march SLR Deadbug   Access Code: [email or web address] URL: [email or web address] Date: 01/01/2022 Prepared by: Josue Hector  Exercises - Sidelying Thoracic and Shoulder Rotation  - 2-3 x daily - 7 x weekly - 1 sets - 10 reps - 5-10 second hold - Supine Piriformis Stretch with Foot on Ground  - 2-3 x daily - 7 x weekly - 1 sets - 3 reps - 20-30 second holdExercises - Seated Thoracic Lumbar Extension with Pectoralis Stretch  - 1 x daily - 7 x weekly - 1 sets - 10 reps - Shoulder Extension with Resistance  - 1 x daily - 7 x weekly - 3 sets - 10 reps - Scapular Retraction with Resistance  - 1 x daily - 7 x weekly - 3 sets - 10 reps - Seated Hamstring Stretch with Strap  - 1 x daily - 7 x weekly - 1 sets - 3 reps - 30 sec hold - Slant Board Gastrocnemius Stretch  - 1 x daily - 7 x weekly - 1 sets - 3 reps - 30 sec hold   Exercises - Supine Bridge  - 2 x daily - 7 x weekly - 1-2 sets - 10 reps - 5 second hold - Supine March   - 2 x daily - 7 x weekly - 1-2 sets - 10 reps - Supine Lower Trunk Rotation  - 2 x daily - 7 x weekly - 1 sets - 10 reps - 5 second hold3/30/23 press up   ASSESSMENT:   CLINICAL IMPRESSION: Patient tolerated DN well with decrease in symptoms following as well as decrease in tissue tension. Continued with core and hip strengthening without increase in symptoms. Added DKTC exercise for abdominal strengthening and hip extension for glute and multifidi strength. Patient will continue to benefit from physical therapy in order to improve function and reduce impairment.    OBJECTIVE IMPAIRMENTS Abnormal gait, decreased activity tolerance, decreased endurance, decreased mobility, difficulty walking, decreased ROM, decreased strength, hypomobility, increased muscle spasms, impaired flexibility, improper body mechanics, postural dysfunction, and pain.    ACTIVITY LIMITATIONS cleaning, community activity, driving, meal prep, laundry, yard work, shopping, and yard work.    PERSONAL FACTORS Fitness, Past/current experiences, Time since onset of injury/illness/exacerbation, and 3+ comorbidities: chronic back pain, DM, increased BMI, cardiac   are also affecting patient's functional outcome.      REHAB POTENTIAL: Good   CLINICAL DECISION MAKING: Stable/uncomplicated   EVALUATION COMPLEXITY: Low     GOALS: Goals reviewed with patient? Yes   SHORT TERM GOALS: Target date: 01/04/2022   Patient will be independent with HEP in order to improve functional outcomes. Baseline:  Goal status: MET    2.  Patient will report at least 25% improvement in symptoms for improved quality of life. Baseline: Reports 60% Goal status: MET       LONG TERM GOALS: Target date: 01/25/2022   Patient will report at least 75% improvement in symptoms for improved quality of life. Baseline: Reports 60%  Goal status: Ongoing   2.  Patient will improve FOTO  score by at least 10 points in order to indicate improved  tolerance to activity. Baseline: Improved 5%  Goal status: Ongoing   3.  Patient will demonstrate at least 25% improvement in lumbar ROM in all restricted planes for improved ability to move trunk while completing chores. Baseline: See AROM  Goal status: Ongoing   4.  Patient will be able to ambulate at least 450 feet in 2MWT with pain no greater than 2/10 in order to demonstrate improved tolerance to activity. Baseline: 452 feet no AD pain at 6/10  Goal status: Partially MET       PLAN: PT FREQUENCY: 1-2x/week   PT DURATION: 4 weeks   PLANNED INTERVENTIONS: PLANNED INTERVENTIONS: Therapeutic exercises, Therapeutic activity, Neuromuscular re-education, Balance training, Gait training, Patient/Family education, Joint manipulation, Joint mobilization, Stair training, Orthotic/Fit training, DME instructions, Aquatic Therapy, Dry Needling, Electrical stimulation, Spinal manipulation, Spinal mobilization, Cryotherapy, Moist heat, Compression bandaging, scar mobilization, Splintting, Taping, Traction, Ultrasound, Ionotophoresis 39m/ml Dexamethasone, and Manual therapy     PLAN FOR NEXT SESSION: Continue manual for mobility/pain, continue extension based lumbar movements as indicated. Core and glute strength, postural strength   9:15 AM, 02/07/22 AMearl LatinPT, DPT Physical Therapist at [facility]

## 2022-02-07 NOTE — Patient Instructions (Signed)
Essential Tremor ?A tremor is trembling or shaking that a person cannot control. Most tremors affect the hands or arms. Tremors can also affect the head, vocal cords, legs, and other parts of the body. Essential tremor is a tremor without a known cause. Usually, it occurs while a person is trying to perform an action. It tends to get worse gradually as a person ages. ?What are the causes? ?The cause of this condition is not known, but it often runs in families. ?What increases the risk? ?You are more likely to develop this condition if: ?You have a family member with essential tremor. ?You are 40 years of age or older. ?What are the signs or symptoms? ?The main sign of a tremor is a rhythmic shaking of certain parts of your body that is uncontrolled and unintentional. You may: ?Have difficulty eating with a spoon or fork. ?Have difficulty writing. ?Nod your head up and down or side to side. ?Have a quivering voice. ?The shaking may: ?Get worse over time. ?Come and go. ?Be more noticeable on one side of your body. ?Get worse due to stress, tiredness (fatigue), caffeine, and extreme heat or cold. ?How is this diagnosed? ?This condition may be diagnosed based on: ?Your symptoms and medical history. ?A physical exam. ?There is no single test to diagnose an essential tremor. However, your health care provider may order tests to rule out other causes of your condition. These may include: ?Blood and urine tests. ?Imaging studies of your brain, such as a CT scan or MRI. ?How is this treated? ?Treatment for essential tremor depends on the severity of the condition. ?Mild tremors may not need treatment if they do not affect your day-to-day life. ?Severe tremors may need to be treated using one or more of the following options: ?Medicines. ?Injections of a substance called botulinum toxin. ?Procedures such as deep brain stimulation (DBS) implantation or MRI-guided ultrasound treatment. ?Lifestyle changes. ?Occupational or  physical therapy. ?Follow these instructions at home: ?Lifestyle ? ?Do not use any products that contain nicotine or tobacco. These products include cigarettes, chewing tobacco, and vaping devices, such as e-cigarettes. If you need help quitting, ask your health care provider. ?Limit your caffeine intake as told by your health care provider. ?Try to get 8 hours of sleep each night. ?Find ways to manage your stress that fit your lifestyle and personality. Consider trying meditation or yoga. ?Try to anticipate stressful situations and allow extra time to manage them. ?If you are struggling emotionally with the effects of your tremor, consider working with a mental health provider. ?General instructions ?Take over-the-counter and prescription medicines only as told by your health care provider. ?Avoid extreme heat and extreme cold. ?Keep all follow-up visits. This is important. Visits may include physical therapy visits. ?Where to find more information ?National Institute of Neurological Disorders and Stroke: www.ninds.nih.gov ?Contact a health care provider if: ?You experience any changes in the location or intensity of your tremors. ?You start having a tremor after starting a new medicine. ?You have a tremor with other symptoms, such as: ?Numbness. ?Tingling. ?Pain. ?Weakness. ?Your tremor gets worse. ?Your tremor interferes with your daily life. ?You feel down, blue, or sad for at least 2 weeks in a row. ?Worrying about your tremor and what other people think about you interferes with your everyday life functions, including relationships, work, or school. ?Summary ?Essential tremor is a tremor without a known cause. Usually, it occurs when you are trying to perform an action. ?You are more likely   to develop this condition if you have a family member with essential tremor. ?The main sign of a tremor is a rhythmic shaking of certain parts of your body that is uncontrolled and unintentional. ?Treatment for essential  tremor depends on the severity of the condition. ?This information is not intended to replace advice given to you by your health care provider. Make sure you discuss any questions you have with your health care provider. ?Document Revised: 06/23/2021 Document Reviewed: 06/23/2021 ?Elsevier Patient Education ? 2023 Elsevier Inc. ? ?

## 2022-02-08 LAB — HEPATIC FUNCTION PANEL
ALT: 59 IU/L — ABNORMAL HIGH (ref 0–44)
AST: 36 IU/L (ref 0–40)
Albumin: 4.8 g/dL (ref 3.8–4.9)
Alkaline Phosphatase: 62 IU/L (ref 44–121)
Bilirubin Total: 0.9 mg/dL (ref 0.0–1.2)
Bilirubin, Direct: 0.26 mg/dL (ref 0.00–0.40)
Total Protein: 7.3 g/dL (ref 6.0–8.5)

## 2022-02-08 LAB — LIPID PANEL
Chol/HDL Ratio: 5.1 ratio — ABNORMAL HIGH (ref 0.0–5.0)
Cholesterol, Total: 194 mg/dL (ref 100–199)
HDL: 38 mg/dL — ABNORMAL LOW (ref >39)
LDL Chol Calc (NIH): 94 mg/dL (ref 0–99)
Triglycerides: 372 mg/dL — ABNORMAL HIGH (ref 0–149)
VLDL Cholesterol Cal: 62 mg/dL — ABNORMAL HIGH (ref 5–40)

## 2022-02-08 LAB — BASIC METABOLIC PANEL
BUN/Creatinine Ratio: 14 (ref 10–24)
BUN: 15 mg/dL (ref 8–27)
CO2: 21 mmol/L (ref 20–29)
Calcium: 9.4 mg/dL (ref 8.6–10.2)
Chloride: 101 mmol/L (ref 96–106)
Creatinine, Ser: 1.07 mg/dL (ref 0.76–1.27)
Glucose: 221 mg/dL — ABNORMAL HIGH (ref 70–99)
Potassium: 4.3 mmol/L (ref 3.5–5.2)
Sodium: 140 mmol/L (ref 134–144)
eGFR: 79 mL/min/{1.73_m2} (ref >59)

## 2022-02-08 LAB — HEMOGLOBIN A1C
Est. average glucose Bld gHb Est-mCnc: 169 mg/dL
Hgb A1c MFr Bld: 7.5 % — ABNORMAL HIGH (ref 4.8–5.6)

## 2022-02-08 LAB — MICROALBUMIN / CREATININE URINE RATIO
Creatinine, Urine: 143.7 mg/dL
Microalb/Creat Ratio: 7 mg/g creat (ref 0–29)
Microalbumin, Urine: 9.5 ug/mL

## 2022-02-09 MED ORDER — EZETIMIBE 10 MG PO TABS: 10.0000 mg | ORAL_TABLET | Freq: Every day | ORAL | 5 refills | Status: DC

## 2022-02-09 NOTE — Addendum Note (Signed)
Addended by: Dairl Ponder on: 02/09/2022 09:59 AM   Modules accepted: Orders

## 2022-02-13 ENCOUNTER — Ambulatory Visit: Payer: Medicare Other | Admitting: Neurology

## 2022-02-19 ENCOUNTER — Encounter (HOSPITAL_COMMUNITY): Payer: Medicare Other | Admitting: Physical Therapy

## 2022-02-19 ENCOUNTER — Telehealth (HOSPITAL_COMMUNITY): Payer: Self-pay | Admitting: Physical Therapy

## 2022-02-19 NOTE — Telephone Encounter (Signed)
He called to cx due to a co-worker has passed away = he will come back Thursday

## 2022-02-20 ENCOUNTER — Encounter: Payer: Self-pay | Admitting: Physical Medicine and Rehabilitation

## 2022-02-20 ENCOUNTER — Ambulatory Visit: Payer: Medicare Other | Admitting: Physical Medicine and Rehabilitation

## 2022-02-20 ENCOUNTER — Ambulatory Visit: Payer: Self-pay

## 2022-02-20 VITALS — BP 106/68 | HR 101

## 2022-02-20 DIAGNOSIS — M5416 Radiculopathy, lumbar region: Secondary | ICD-10-CM | POA: Diagnosis not present

## 2022-02-20 MED ORDER — METHYLPREDNISOLONE ACETATE 80 MG/ML IJ SUSP
80.0000 mg | Freq: Once | INTRAMUSCULAR | Status: AC
  Administered 2022-02-20: 80 mg

## 2022-02-20 NOTE — Progress Notes (Unsigned)
Pt state lower back pain. Pt state bending and standing makes the pain worse. Pt state he takes pain meds and drying needling to help ease his pain.  Numeric Pain Rating Scale and Functional Assessment Average Pain 3   In the last MONTH (on 0-10 scale) has pain interfered with the following?  1. General activity like being  able to carry out your everyday physical activities such as walking, climbing stairs, carrying groceries, or moving a chair?  Rating(10)   +Driver, -BT, -Dye Allergies.

## 2022-02-20 NOTE — Patient Instructions (Signed)

## 2022-02-21 NOTE — Procedures (Signed)
Lumbar Epidural Steroid Injection - Interlaminar Approach with Fluoroscopic Guidance  Patient: Jared Griffin      Date of Birth: [birth year] MRN: [id number] PCP: [name], MD      Visit Date: 02/20/2022   Universal Protocol:     Consent Given By: the patient  Position: PRONE  Additional Comments: Vital signs were monitored before and after the procedure. Patient was prepped and draped in the usual sterile fashion. The correct patient, procedure, and site was verified.   Injection Procedure Details:   Procedure diagnoses: Lumbar radiculopathy [M54.16]   Meds Administered:  Meds ordered this encounter  Medications   methylPREDNISolone acetate (DEPO-MEDROL) injection 80 mg     Laterality: Right  Location/Site:  L3-4  Needle: 3.5 in., 20 ga. Tuohy  Needle Placement: Paramedian epidural  Findings:   -Comments: Excellent flow of contrast into the epidural space.  Procedure Details: Using a paramedian approach from the side mentioned above, the region overlying the inferior lamina was localized under fluoroscopic visualization and the soft tissues overlying this structure were infiltrated with 4 ml. of 1% Lidocaine without Epinephrine. The Tuohy needle was inserted into the epidural space using a paramedian approach.   The epidural space was localized using loss of resistance along with counter oblique bi-planar fluoroscopic views.  After negative aspirate for air, blood, and CSF, a 2 ml. volume of Isovue-250 was injected into the epidural space and the flow of contrast was observed. Radiographs were obtained for documentation purposes.    The injectate was administered into the level noted above.   Additional Comments:  The patient tolerated the procedure well Dressing: 2 x 2 sterile gauze and Band-Aid    Post-procedure details: Patient was observed during the procedure. Post-procedure instructions were reviewed.  Patient left the clinic in stable  condition.

## 2022-02-21 NOTE — Progress Notes (Signed)
DERREN Griffin - 61 y.o. male MRN [id number]  Date of birth: 1961/09/06  Office Visit Note: Visit Date: 02/20/2022 PCP: [name], MD Referred by: [name], MD  Subjective: Chief Complaint  Patient presents with   Lower Back - Pain   HPI:  Jared Griffin is a 61 y.o. male who comes in today at the request of [name], FNP for planned Right L3-4 Lumbar Interlaminar epidural steroid injection with fluoroscopic guidance.  The patient has failed conservative care including home exercise, medications, time and activity modification.  This injection will be diagnostic and hopefully therapeutic.  Please see requesting physician notes for further details and justification.  ROS Otherwise per HPI.  Assessment & Plan: Visit Diagnoses:    ICD-10-CM   1. Lumbar radiculopathy  M54.16 XR C-ARM NO REPORT    Epidural Steroid injection    methylPREDNISolone acetate (DEPO-MEDROL) injection 80 mg      Plan: No additional findings.   Meds & Orders:  Meds ordered this encounter  Medications   methylPREDNISolone acetate (DEPO-MEDROL) injection 80 mg    Orders Placed This Encounter  Procedures   XR C-ARM NO REPORT   Epidural Steroid injection    Follow-up: Return if symptoms worsen or fail to improve.   Procedures: No procedures performed  Lumbar Epidural Steroid Injection - Interlaminar Approach with Fluoroscopic Guidance  Patient: Jared Griffin      Date of Birth: 1961-05-21 MRN: [id number] PCP: [name], MD      Visit Date: 02/20/2022   Universal Protocol:     Consent Given By: the patient  Position: PRONE  Additional Comments: Vital signs were monitored before and after the procedure. Patient was prepped and draped in the usual sterile fashion. The correct patient, procedure, and site was verified.   Injection Procedure Details:   Procedure diagnoses: Lumbar radiculopathy [M54.16]   Meds Administered:  Meds ordered this encounter   Medications   methylPREDNISolone acetate (DEPO-MEDROL) injection 80 mg     Laterality: Right  Location/Site:  L3-4  Needle: 3.5 in., 20 ga. Tuohy  Needle Placement: Paramedian epidural  Findings:   -Comments: Excellent flow of contrast into the epidural space.  Procedure Details: Using a paramedian approach from the side mentioned above, the region overlying the inferior lamina was localized under fluoroscopic visualization and the soft tissues overlying this structure were infiltrated with 4 ml. of 1% Lidocaine without Epinephrine. The Tuohy needle was inserted into the epidural space using a paramedian approach.   The epidural space was localized using loss of resistance along with counter oblique bi-planar fluoroscopic views.  After negative aspirate for air, blood, and CSF, a 2 ml. volume of Isovue-250 was injected into the epidural space and the flow of contrast was observed. Radiographs were obtained for documentation purposes.    The injectate was administered into the level noted above.   Additional Comments:  The patient tolerated the procedure well Dressing: 2 x 2 sterile gauze and Band-Aid    Post-procedure details: Patient was observed during the procedure. Post-procedure instructions were reviewed.  Patient left the clinic in stable condition.   Clinical History: MRI LUMBAR SPINE WITHOUT CONTRAST   TECHNIQUE: Multiplanar, multisequence MR imaging of the lumbar spine was performed. No intravenous contrast was administered.   COMPARISON:  Plain films lumbar spine 11/10/2018.   FINDINGS: Segmentation: The patient has transitional lumbosacral anatomy. Numbering scheme on this study and is based on the lowest visible pair of ribs on the  comparison plain films. Based on this numbering scheme, there is lumbarization of the first sacral segment on the right and a fully formed disc at the S1-2 level.   Alignment:  Maintained.   Vertebrae: No fracture,  evidence of discitis or worrisome lesion. Hemangioma in L4 is noted.   Conus medullaris and cauda equina: Conus extends to the L2 level. Conus and cauda equina appear normal.   Paraspinal and other soft tissues: Negative.   Disc levels:   L1-2: Mild facet arthropathy.  Otherwise negative.   L2-3: Mild facet degenerative change.  Otherwise negative.   L2-3: Shallow disc bulge, ligamentum flavum thickening and mild to moderate facet degenerative disease. No stenosis.   L3-4: Minimal disc bulge.  No stenosis.   L5-S1: Minimal disc bulge.  No stenosis.   S1-2: Transitional segment.  Negative.   IMPRESSION: Transitional lumbosacral anatomy. Please see numbering scheme above and correlate with plain films if any intervention is planned.   Mild lumbar degenerative change without central canal or foraminal narrowing. Scattered facet arthropathy is most notable at L3-4.     Electronically Signed   By: Inge Rise M.D.   On: 10/10/2019 10:56     Objective:  VS:  HT:    WT:   BMI:     BP:106/68  HR:(!) 101bpm  TEMP: ( )  RESP:  Physical Exam Vitals and nursing note reviewed.  Constitutional:      General: He is not in acute distress.    Appearance: Normal appearance. He is not ill-appearing.  HENT:     Head: Normocephalic and atraumatic.     Right Ear: External ear normal.     Left Ear: External ear normal.     Nose: No congestion.  Eyes:     Extraocular Movements: Extraocular movements intact.  Cardiovascular:     Rate and Rhythm: Normal rate.     Pulses: Normal pulses.  Pulmonary:     Effort: Pulmonary effort is normal. No respiratory distress.  Abdominal:     General: There is no distension.     Palpations: Abdomen is soft.  Musculoskeletal:        General: No tenderness or signs of injury.     Cervical back: Neck supple.     Right lower leg: No edema.     Left lower leg: No edema.     Comments: Patient has good distal strength without clonus.   Skin:    Findings: No erythema or rash.  Neurological:     General: No focal deficit present.     Mental Status: He is alert and oriented to person, place, and time.     Sensory: No sensory deficit.     Motor: No weakness or abnormal muscle tone.     Coordination: Coordination normal.  Psychiatric:        Mood and Affect: Mood normal.        Behavior: Behavior normal.     Imaging: XR C-ARM NO REPORT  Result Date: 02/20/2022 Please see Notes tab for imaging impression.

## 2022-02-22 ENCOUNTER — Ambulatory Visit (HOSPITAL_COMMUNITY): Payer: Medicare Other | Attending: Physical Medicine and Rehabilitation | Admitting: Physical Therapy

## 2022-02-22 ENCOUNTER — Encounter (HOSPITAL_COMMUNITY): Payer: Self-pay | Admitting: Physical Therapy

## 2022-02-22 DIAGNOSIS — M542 Cervicalgia: Secondary | ICD-10-CM | POA: Diagnosis present

## 2022-02-22 DIAGNOSIS — M5416 Radiculopathy, lumbar region: Secondary | ICD-10-CM | POA: Diagnosis present

## 2022-02-22 DIAGNOSIS — M545 Low back pain, unspecified: Secondary | ICD-10-CM | POA: Insufficient documentation

## 2022-02-22 NOTE — Therapy (Signed)
OUTPATIENT PHYSICAL THERAPY TREATMENT NOTE   Patient Name: Jared Griffin MRN: [id number] DOB:1961/01/04, 61 y.o., male Today's Date: 02/22/2022  Progress Note Reporting Period 12/14/21 to 02/22/22  See note below for Objective Data and Assessment of Progress/Goals.      PCP: [name], MD REFERRING PROVIDER: Lorine Bears, NP   END OF SESSION:   PT End of Session - 02/22/22 1644     Visit Number 9    Number of Visits 21    Date for PT Re-Evaluation 04/05/22    Authorization Type [employer] Medicare (no auth, no VL)    Progress Note Due on Visit 67    PT Start Time 1642    PT Stop Time 1727    PT Time Calculation (min) 45 min    Activity Tolerance Patient tolerated treatment well    Behavior During Therapy WFL for tasks assessed/performed              Past Medical History:  Diagnosis Date   Anxiety    COPD (chronic obstructive pulmonary disease) ([place])    Coronary atherosclerosis of native coronary artery    a. 12/09/2013: s/p PCI with 3.0 x 28 Promus DES to mLAD   Depression    Diabetic neuropathy ([place])    Elbow injury 07/25/2017   Surgery for torn tendon   Essential hypertension    Fatty liver disease, nonalcoholic    GERD (gastroesophageal reflux disease)    Iron deficiency anemia 08/15/2021   Lateral epicondylitis of right elbow    Mixed hyperlipidemia    Obesity    Osgood-Schlatter's disease of right knee    Skin cyst 10/03/2017   Removed from back   Type 2 diabetes mellitus ([place])    Past Surgical History:  Procedure Laterality Date   BIOPSY  01/21/2019   Procedure: BIOPSY;  Surgeon: [name], MD;  [facility];  Service: Endoscopy;;  gastric bx and gastric polyps   BIOPSY  11/08/2020   Procedure: BIOPSY;  Surgeon: [name], MD;  [facility];  Service: Gastroenterology;;   CARDIAC CATHETERIZATION  2011   CARDIAC CATHETERIZATION N/A 09/20/2015   Procedure: Left Heart Cath and Coronary Angiography;   Surgeon: [name], MD;  Location: [facility];  Service: Cardiovascular;  Laterality: N/A;   CHOLECYSTECTOMY  2003   COLONOSCOPY  06/19/2012   Procedure: COLONOSCOPY;  Surgeon: [name], MD;  [facility];  Service: Endoscopy;  Laterality: N/A;  930   COLONOSCOPY N/A 10/17/2017   Procedure: COLONOSCOPY;  Surgeon: [name], MD;  [facility];  Service: Endoscopy;  Laterality: N/A;  1225   COLONOSCOPY WITH PROPOFOL N/A 11/08/2020   Procedure: COLONOSCOPY WITH PROPOFOL;  Surgeon: [name], MD;  [facility];  Service: Gastroenterology;  Laterality: N/A;  10:45   COLONOSCOPY WITH PROPOFOL N/A 04/04/2021   Procedure: COLONOSCOPY WITH PROPOFOL;  Surgeon: [name], MD;  [facility];  Service: Gastroenterology;  Laterality: N/A;   CYST EXCISION  07/2019   ESOPHAGOGASTRODUODENOSCOPY (EGD) WITH PROPOFOL N/A 01/21/2019   Procedure: ESOPHAGOGASTRODUODENOSCOPY (EGD) WITH PROPOFOL;  Surgeon: [name], MD;  [facility];  Service: Endoscopy;  Laterality: N/A;  10:15am   ESOPHAGOGASTRODUODENOSCOPY (EGD) WITH PROPOFOL N/A 02/17/2021   Procedure: ESOPHAGOGASTRODUODENOSCOPY (EGD) WITH PROPOFOL;  Surgeon: [name], MD;  [facility];  Service: Gastroenterology;  Laterality: N/A;  10:20   ESOPHAGOGASTRODUODENOSCOPY (EGD)  WITH PROPOFOL N/A 04/04/2021   Procedure: ESOPHAGOGASTRODUODENOSCOPY (EGD) WITH PROPOFOL;  Surgeon: [name], MD;  [facility];  Service: Gastroenterology;  Laterality: N/A;  12:30   GIVENS CAPSULE STUDY N/A 09/13/2021   Procedure: GIVENS CAPSULE STUDY;  Surgeon: [name], MD;  [facility];  Service: Gastroenterology;  Laterality: N/A;  7:30   KNEE ARTHROPLASTY Right 1999   LEFT HEART CATH AND CORONARY ANGIOGRAPHY N/A 02/26/2018   Procedure: LEFT HEART CATH AND CORONARY ANGIOGRAPHY;  Surgeon:  [name], MD;  Location: [facility];  Service: Cardiovascular;  Laterality: N/A;   LEFT HEART CATHETERIZATION WITH CORONARY ANGIOGRAM N/A 12/09/2013   Procedure: LEFT HEART CATHETERIZATION WITH CORONARY ANGIOGRAM;  Surgeon: [name], MD;  [facility];  Service: Cardiovascular;  Laterality: N/A;   Liver biopsy     POLYPECTOMY  10/17/2017   Procedure: POLYPECTOMY;  Surgeon: [name], MD;  [facility];  Service: Endoscopy;;  ascending colon, splenic flexure,sigmoid x2   POLYPECTOMY  11/08/2020   Procedure: POLYPECTOMY;  Surgeon: [name], MD;  [facility];  Service: Gastroenterology;;   POLYPECTOMY  04/04/2021   Procedure: POLYPECTOMY;  Surgeon: [name], MD;  [facility];  Service: Gastroenterology;;   TENNIS ELBOW RELEASE/NIRSCHEL PROCEDURE Right 07/25/2017   Procedure: TENNIS ELBOW RELEASE and debriedment;  Surgeon: [name], MD;  Location: [facility];  Service: Orthopedics;  Laterality: Right;   TONSILLECTOMY  1970's   Patient Active Problem List   Diagnosis Date Noted   Diarrhea 12/14/2021   Iron deficiency anemia 08/15/2021   Orthostatic hypotension 12/07/2020   IBS (irritable bowel syndrome) 11/17/2020   Chronic diarrhea 08/17/2020   NASH (nonalcoholic steatohepatitis) 08/17/2020   Elevated LFTs 08/17/2020   Tremor of both hands 05/06/2020   Bilateral occipital neuralgia 05/06/2020   Abdominal pain, chronic, epigastric 01/13/2019   Anxiety 01/04/2019   Radicular pain of left lower extremity 12/22/2018   Moderate persistent asthma 10/25/2018   Dizziness 07/12/2018   Nonintractable headache    Abnormal nuclear stress test    Aftercare following surgery 07/25/17 08/13/2017   Hx of colonic polyps 08/07/2017   Lateral epicondylitis of right elbow    Cervical nerve root impingement 12/09/2015   Generalized anxiety disorder 09/27/2015   Chest pain 09/20/2015    Pain in the chest    Depression 01/24/2015   Esophageal reflux 01/24/2015   Preoperative cardiovascular examination 06/28/2014   DM type 2 causing vascular disease ([place])    Mixed hyperlipidemia    Obesity    Intermediate coronary syndrome ([place]) 12/09/2013   Lumbago 05/21/2012   Essential hypertension, benign 12/28/2009   CAD S/P LAD DES March 2015 12/28/2009   Hyperlipidemia LDL goal <70 11/25/2009   Accelerating angina ([place]) 11/25/2009   DM 11/24/2009   ABDOMINAL PAIN, HX OF 11/24/2009    REFERRING DIAG: M47.816 (ICD-10-CM) - Spondylosis without myelopathy or radiculopathy, lumbar region M54.50,G89.29 (ICD-10-CM) - Chronic bilateral low back pain without sciatica M47.816 (ICD-10-CM) - Facet hypertrophy of lumbar region M79.18 (ICD-10-CM) - Myofascial pain syndrome   THERAPY DIAG:  Low back pain, unspecified back pain laterality, unspecified chronicity, unspecified whether sciatica present  Radiculopathy, lumbar region  Cervicalgia  PERTINENT HISTORY: Chronic back pain  PRECAUTIONS: None  SUBJECTIVE: Patient returns with referral for neck pain. He reports having injection in lumbar spine 2 days. Has not yet noted improvement form injection, but does note overall 75% improvement in  back pain since starting therapy. He says it rally only bothers him now when he turns or performs twisting motion. He reports his neck and upper back area has been bothering him more lately.   PAIN:  Are you having pain? Yes: NPRS scale: 5/10 Pain location: across low lumbar   Pain description: aching, sore Aggravating factors: "nothing in particular" Relieving factors: Unsure   OBJECTIVE:      POSTURE:  Forward head   PALPATION: Mod TTP about bilateral lumbar paraspinals    LUMBAR ROM:    Active  A/PROM  12/14/2021 AROM 12/26/21 AROM  01/01/22 AROM  01/25/22 AROM 02/22/22  Flexion 50% limited* 50% limited* 25% limited  10% limited  10% limited  Extension 50% limited* 50% limited * 50%  limited * 30% limited 25% limited   Right lateral flexion 25% limited*    10% limited  Left lateral flexion 25% limited*    10% limited   Right rotation 25% limited      Left rotation 25% limited       (Blank rows = not tested)    Cervical AROM: Flexion 40 deg Extension 27 deg  RT rotation 25 deg LT rotation 40 deg    LE MMT:   MMT Right 12/14/2021 Right 01/25/2022 Right  02/22/22 Left 12/14/2021 Left 01/25/2022 Left 02/22/22  Hip flexion 4+/5 5/5 5 4+/5 5/5 5  Hip extension 4/5 4+/5 5 4/5 4+/5 5  Hip abduction 4/5 5/5 5 4+/5 4/5 4+  Hip adduction          Hip internal rotation          Hip external rotation          Knee flexion 5/5 5/5  5/5 5/5   Knee extension 5/5 5/5  5/5 5/5   Ankle dorsiflexion 5/5 5/5  5/5 5/5   Ankle plantarflexion          Ankle inversion          Ankle eversion           (Blank rows = not tested)   LUMBAR SPECIAL TESTS:  (+) Sacral compression    FUNCTIONAL TESTS:  2 minute walk test: 452 feet no AD    TODAY'S TREATMENT  02/22/22 Reassess MMT AROM  FOTO: 48% function Chin tuck x5 Scap retraction x5 Cervical rotation x5  Manual STM to bilateral upper trap pre and post dry needling for trigger point identification and surface area preparation   Trigger Point Dry-Needling  Treatment instructions: Expect mild to moderate muscle soreness. S/S of pneumothorax if dry needled over a lung field, and to seek immediate medical attention should they occur. Patient verbalized understanding of these instructions and education.  Patient Consent Given: Yes Education handout provided: Previously provided Muscles treated: bilateral upper trap Electrical stimulation performed: No Parameters: N/A Treatment response/outcome: good tolerance, several twitches noted RT>LT   02/07/22  DRY NEEDLING: Dry needling consent given? yes Educational handouts provided? Previously given  Muscles treated: Lower R lumbar multifidi  Response from dry needling: good  tolerance   Manual STM to bilateral lumbar paraspinals pre and post dry needling for trigger point identification and surface area preparation   DKTC with heels on green ball 10 x 5 second holds; 2 sets Prone hip extension 2x 10 bilateral     PATIENT EDUCATION:  Education details: exercise form and function Person educated: Patient Education method: Explanation, Demonstration, and Handouts Education comprehension: verbalized understanding, returned demonstration, verbal cues required, and tactile cues  required       HOME EXERCISE PROGRAM: 01/11/22 Ab march SLR [employer]   Access Code: [email or web address] URL: [email or web address] Date: 01/01/2022 Prepared by: Josue Hector  Exercises - Sidelying Thoracic and Shoulder Rotation  - 2-3 x daily - 7 x weekly - 1 sets - 10 reps - 5-10 second hold - Supine Piriformis Stretch with Foot on Ground  - 2-3 x daily - 7 x weekly - 1 sets - 3 reps - 20-30 second holdExercises - Seated Thoracic Lumbar Extension with Pectoralis Stretch  - 1 x daily - 7 x weekly - 1 sets - 10 reps - Shoulder Extension with Resistance  - 1 x daily - 7 x weekly - 3 sets - 10 reps - Scapular Retraction with Resistance  - 1 x daily - 7 x weekly - 3 sets - 10 reps - Seated Hamstring Stretch with Strap  - 1 x daily - 7 x weekly - 1 sets - 3 reps - 30 sec hold - Slant Board Gastrocnemius Stretch  - 1 x daily - 7 x weekly - 1 sets - 3 reps - 30 sec hold   Exercises - Supine Bridge  - 2 x daily - 7 x weekly - 1-2 sets - 10 reps - 5 second hold - Supine March  - 2 x daily - 7 x weekly - 1-2 sets - 10 reps - Supine Lower Trunk Rotation  - 2 x daily - 7 x weekly - 1 sets - 10 reps - 5 second hold3/30/23 press up  02/22/22 Chin tuck  Scap retraction  Cervical rotation    ASSESSMENT:   CLINICAL IMPRESSION: Patient making steady progress to therapy goals. Included cervical referral in assessment and objective measures completed this date. Updated goals to reflect  addition of cervical treatment. Patient continues to be limited by pain, ROM restriction and decreased flexibility which is negatively impacting functional ability. Patient will continue to benefit from skilled therapy services to address these deficits to reduce pain and improve LOF with ADLs.   OBJECTIVE IMPAIRMENTS Abnormal gait, decreased activity tolerance, decreased endurance, decreased mobility, difficulty walking, decreased ROM, decreased strength, hypomobility, increased muscle spasms, impaired flexibility, improper body mechanics, postural dysfunction, and pain.    ACTIVITY LIMITATIONS cleaning, community activity, driving, meal prep, laundry, yard work, shopping, and yard work.    PERSONAL FACTORS Fitness, Past/current experiences, Time since onset of injury/illness/exacerbation, and 3+ comorbidities: chronic back pain, DM, increased BMI, cardiac   are also affecting patient's functional outcome.      REHAB POTENTIAL: Good   CLINICAL DECISION MAKING: Stable/uncomplicated   EVALUATION COMPLEXITY: Low     GOALS: Goals reviewed with patient? Yes   SHORT TERM GOALS: Target date: 01/04/2022   Patient will be independent with HEP in order to improve functional outcomes. Baseline:  Goal status: MET    2.  Patient will report at least 25% improvement in symptoms for improved quality of life. Baseline: Reports 60% Goal status: MET       LONG TERM GOALS: Target date: 01/25/2022   Patient will report at least 75% improvement in (lumbar) symptoms for improved quality of life. Baseline: Reports 75%  Goal status: MET (modified)   2.  Patient will improve FOTO score by at least 10 points in order to indicate improved tolerance to activity. Baseline: Improved 5%  Goal status: Ongoing   3.  Patient will demonstrate at least 25% improvement in lumbar ROM in all restricted planes for improved ability to move trunk while  completing chores. Baseline: See AROM  Goal status: Partially  MET   4.  Patient will be able to ambulate at least 450 feet in 2MWT with pain no greater than 2/10 in order to demonstrate improved tolerance to activity. Baseline: Distance no issue but baseline 5/10 pain  Goal status: Partially MET  5. Patient improve RT cervical rotation by at least 10 degrees in order to improve ability to scan environment for safety and while driving. Baseline: 25 degrees Goal status: NEW  6.  Patient will report at least 75% improvement in (cervical) symptoms for improved quality of life. Baseline:  Goal status: NEW  PLAN: PT FREQUENCY: 1-2x/week   PT DURATION: 6 weeks   PLANNED INTERVENTIONS: PLANNED INTERVENTIONS: Therapeutic exercises, Therapeutic activity, Neuromuscular re-education, Balance training, Gait training, Patient/Family education, Joint manipulation, Joint mobilization, Stair training, Orthotic/Fit training, DME instructions, Aquatic Therapy, Dry Needling, Electrical stimulation, Spinal manipulation, Spinal mobilization, Cryotherapy, Moist heat, Compression bandaging, scar mobilization, Splintting, Taping, Traction, Ultrasound, Ionotophoresis 87m/ml Dexamethasone, and Manual therapy     PLAN FOR NEXT SESSION: Add cervical and scapular strengthening. Continue manual and DN for mobility/pain. Continue extension based lumbar movements as indicated. Core and glute strength, postural strength   5:26 PM, 02/22/22 CJosue HectorPT DPT  Physical Therapist with [facility] [phone]

## 2022-02-27 ENCOUNTER — Encounter (HOSPITAL_COMMUNITY): Payer: Self-pay | Admitting: Physical Therapy

## 2022-02-27 ENCOUNTER — Ambulatory Visit (HOSPITAL_COMMUNITY): Payer: Medicare Other | Attending: Neurosurgery | Admitting: Physical Therapy

## 2022-02-27 DIAGNOSIS — M47812 Spondylosis without myelopathy or radiculopathy, cervical region: Secondary | ICD-10-CM | POA: Diagnosis present

## 2022-02-27 DIAGNOSIS — M5416 Radiculopathy, lumbar region: Secondary | ICD-10-CM

## 2022-02-27 DIAGNOSIS — M545 Low back pain, unspecified: Secondary | ICD-10-CM

## 2022-02-27 DIAGNOSIS — M542 Cervicalgia: Secondary | ICD-10-CM

## 2022-02-27 NOTE — Therapy (Signed)
OUTPATIENT PHYSICAL THERAPY TREATMENT NOTE   Patient Name: Jared Griffin MRN: [id number] DOB:07/26/1961, 61 y.o., male Today's Date: 02/27/2022    See note below for Objective Data and Assessment of Progress/Goals.      PCP: Luking, Scott A, MD REFERRING PROVIDER: Williams, Megan E, NP   END OF SESSION:   PT End of Session - 02/27/22 0817     Visit Number 10    Number of Visits 21    Date for PT Re-Evaluation 04/05/22    Authorization Type [employer] Medicare (no auth, no VL)    Progress Note Due on Visit 19    PT Start Time 0817    PT Stop Time 0857    PT Time Calculation (min) 40 min    Activity Tolerance Patient tolerated treatment well    Behavior During Therapy WFL for tasks assessed/performed              Past Medical History:  Diagnosis Date   Anxiety    COPD (chronic obstructive pulmonary disease) (HCC)    Coronary atherosclerosis of native coronary artery    a. 12/09/2013: s/p PCI with 3.0 x 28 Promus DES to mLAD   Depression    Diabetic neuropathy (HCC)    Elbow injury 07/25/2017   Surgery for torn tendon   Essential hypertension    Fatty liver disease, nonalcoholic    GERD (gastroesophageal reflux disease)    Iron deficiency anemia 08/15/2021   Lateral epicondylitis of right elbow    Mixed hyperlipidemia    Obesity    Osgood-Schlatter's disease of right knee    Skin cyst 10/03/2017   Removed from back   Type 2 diabetes mellitus (HCC)    Past Surgical History:  Procedure Laterality Date   BIOPSY  01/21/2019   Procedure: BIOPSY;  Surgeon: Rehman, Najeeb U, MD;  Location: [facility];  Service: Endoscopy;;  gastric bx and gastric polyps   BIOPSY  11/08/2020   Procedure: BIOPSY;  Surgeon: Castaneda Mayorga, Daniel, MD;  Location: [facility];  Service: Gastroenterology;;   CARDIAC CATHETERIZATION  2011   CARDIAC CATHETERIZATION N/A 09/20/2015   Procedure: Left Heart Cath and Coronary Angiography;  Surgeon: Christopher D McAlhany, MD;  [facility]: [facility];  Service: Cardiovascular;  Laterality: N/A;   CHOLECYSTECTOMY  2003   COLONOSCOPY  06/19/2012   Procedure: COLONOSCOPY;  Surgeon: Najeeb U Rehman, MD;  [facility]: [facility];  Service: Endoscopy;  Laterality: N/A;  930   COLONOSCOPY N/A 10/17/2017   Procedure: COLONOSCOPY;  Surgeon: Rehman, Najeeb U, MD;  [facility]: [facility];  Service: Endoscopy;  Laterality: N/A;  1225   COLONOSCOPY WITH PROPOFOL N/A 11/08/2020   Procedure: COLONOSCOPY WITH PROPOFOL;  Surgeon: Castaneda Mayorga, Daniel, MD;  [facility]: [facility];  Service: Gastroenterology;  Laterality: N/A;  10:45   COLONOSCOPY WITH PROPOFOL N/A 04/04/2021   Procedure: COLONOSCOPY WITH PROPOFOL;  Surgeon: Castaneda Mayorga, Daniel, MD;  [facility]: [facility];  Service: Gastroenterology;  Laterality: N/A;   CYST EXCISION  07/2019   ESOPHAGOGASTRODUODENOSCOPY (EGD) WITH PROPOFOL N/A 01/21/2019   Procedure: ESOPHAGOGASTRODUODENOSCOPY (EGD) WITH PROPOFOL;  Surgeon: Rehman, Najeeb U, MD;  [facility]: [facility];  Service: Endoscopy;  Laterality: N/A;  10:15am   ESOPHAGOGASTRODUODENOSCOPY (EGD) WITH PROPOFOL N/A 02/17/2021   Procedure: ESOPHAGOGASTRODUODENOSCOPY (EGD) WITH PROPOFOL;  Surgeon: Castaneda Mayorga, Daniel, MD;  [facility];  Service: Gastroenterology;  Laterality: N/A;  10:20   ESOPHAGOGASTRODUODENOSCOPY (EGD) WITH PROPOFOL N/A 04/04/2021     Procedure: ESOPHAGOGASTRODUODENOSCOPY (EGD) WITH PROPOFOL;  Surgeon: Castaneda Mayorga, Daniel, MD;  [facility];  Service: Gastroenterology;  Laterality: N/A;  12:30   GIVENS CAPSULE STUDY N/A 09/13/2021   Procedure: GIVENS CAPSULE STUDY;  Surgeon: Castaneda Mayorga, Daniel, MD;  [facility];  Service: Gastroenterology;  Laterality: N/A;  7:30   KNEE ARTHROPLASTY Right 1999   LEFT HEART CATH AND CORONARY ANGIOGRAPHY N/A 02/26/2018   Procedure: LEFT HEART CATH AND CORONARY ANGIOGRAPHY;  Surgeon: Varanasi, Jayadeep S, MD;  [facility];  Service: Cardiovascular;  Laterality: N/A;   LEFT HEART CATHETERIZATION WITH CORONARY ANGIOGRAM N/A 12/09/2013   Procedure: LEFT HEART CATHETERIZATION WITH CORONARY ANGIOGRAM;  Surgeon: Jayadeep S Varanasi, MD;  Location: [facility];  Service: Cardiovascular;  Laterality: N/A;   Liver biopsy     POLYPECTOMY  10/17/2017   Procedure: POLYPECTOMY;  Surgeon: Rehman, Najeeb U, MD;  [facility];  Service: Endoscopy;;  ascending colon, splenic flexure,sigmoid x2   POLYPECTOMY  11/08/2020   Procedure: POLYPECTOMY;  Surgeon: Castaneda Mayorga, Daniel, MD;  [facility];  Service: Gastroenterology;;   POLYPECTOMY  04/04/2021   Procedure: POLYPECTOMY;  Surgeon: Castaneda Mayorga, Daniel, MD;  [facility]: [facility];  Service: Gastroenterology;;   TENNIS ELBOW RELEASE/NIRSCHEL PROCEDURE Right 07/25/2017   Procedure: TENNIS ELBOW RELEASE and debriedment;  Surgeon: Harrison, Stanley E, MD;  Location: [facility];  Service: Orthopedics;  Laterality: Right;   TONSILLECTOMY  1970's   Patient Active Problem List   Diagnosis Date Noted   Diarrhea 12/14/2021   Iron deficiency anemia 08/15/2021   Orthostatic hypotension 12/07/2020   IBS (irritable bowel syndrome) 11/17/2020   Chronic diarrhea 08/17/2020   NASH (nonalcoholic steatohepatitis) 08/17/2020   Elevated LFTs 08/17/2020   Tremor of both hands 05/06/2020   Bilateral occipital neuralgia 05/06/2020   Abdominal pain, chronic, epigastric 01/13/2019   Anxiety 01/04/2019   Radicular pain of left lower extremity 12/22/2018   Moderate persistent asthma 10/25/2018   Dizziness 07/12/2018   Nonintractable headache    Abnormal nuclear stress test    Aftercare following surgery 07/25/17 08/13/2017   Hx of colonic polyps 08/07/2017   Lateral epicondylitis of right elbow    Cervical nerve root impingement 12/09/2015   Generalized anxiety disorder 09/27/2015   Chest pain 09/20/2015   Pain in the chest    Depression 01/24/2015    Esophageal reflux 01/24/2015   Preoperative cardiovascular examination 06/28/2014   DM type 2 causing vascular disease (HCC)    Mixed hyperlipidemia    Obesity    Intermediate coronary syndrome (HCC) 12/09/2013   Lumbago 05/21/2012   Essential hypertension, benign 12/28/2009   CAD S/P LAD DES March 2015 12/28/2009   Hyperlipidemia LDL goal <70 11/25/2009   Accelerating angina (HCC) 11/25/2009   DM 11/24/2009   ABDOMINAL PAIN, HX OF 11/24/2009    REFERRING DIAG: M47.816 (ICD-10-CM) - Spondylosis without myelopathy or radiculopathy, lumbar region M54.50,G89.29 (ICD-10-CM) - Chronic bilateral low back pain without sciatica M47.816 (ICD-10-CM) - Facet hypertrophy of lumbar region M79.18 (ICD-10-CM) - Myofascial pain syndrome   THERAPY DIAG:  Low back pain, unspecified back pain laterality, unspecified chronicity, unspecified whether sciatica present  Radiculopathy, lumbar region  Cervicalgia  PERTINENT HISTORY: Chronic back pain  PRECAUTIONS: None  SUBJECTIVE: Patient states neck is about the same today. His mid back is bothering him more so this morning.   PAIN:  Are you having pain? Yes: NPRS scale: 2/10 Pain location: across low lumbar     Pain description: aching, sore Aggravating factors: "nothing in particular" Relieving factors: Unsure   OBJECTIVE:      POSTURE:  Forward head   PALPATION: Mod TTP about bilateral lumbar paraspinals    LUMBAR ROM:    Active  A/PROM  12/14/2021 AROM 12/26/21 AROM  01/01/22 AROM  01/25/22 AROM 02/22/22  Flexion 50% limited* 50% limited* 25% limited  10% limited  10% limited  Extension 50% limited* 50% limited * 50% limited * 30% limited 25% limited   Right lateral flexion 25% limited*    10% limited  Left lateral flexion 25% limited*    10% limited   Right rotation 25% limited      Left rotation 25% limited       (Blank rows = not tested)    Cervical AROM: Flexion 40 deg Extension 27 deg  RT rotation 25 deg LT rotation 40  deg    LE MMT:   MMT Right 12/14/2021 Right 01/25/2022 Right  02/22/22 Left 12/14/2021 Left 01/25/2022 Left 02/22/22  Hip flexion 4+/5 5/5 5 4+/5 5/5 5  Hip extension 4/5 4+/5 5 4/5 4+/5 5  Hip abduction 4/5 5/5 5 4+/5 4/5 4+  Hip adduction          Hip internal rotation          Hip external rotation          Knee flexion 5/5 5/5  5/5 5/5   Knee extension 5/5 5/5  5/5 5/5   Ankle dorsiflexion 5/5 5/5  5/5 5/5   Ankle plantarflexion          Ankle inversion          Ankle eversion           (Blank rows = not tested)   LUMBAR SPECIAL TESTS:  (+) Sacral compression    FUNCTIONAL TESTS:  2 minute walk test: 452 feet no AD    TODAY'S TREATMENT  02/27/22 UBE 3/3 FWD/ Back lv 1 Band rows GTB 2 x 10  Shoulder extension GTB 2 x 10 Band ER GTB 2 x 10 Seated UT stretch 3 x 20" Palloff press GTB 2 x 10   Manual STM to bilateral thoracic paraspinals pre and post dry needling for trigger point identification and surface area preparation   Trigger Point Dry-Needling  Treatment instructions: Expect mild to moderate muscle soreness. S/S of pneumothorax if dry needled over a lung field, and to seek immediate medical attention should they occur. Patient verbalized understanding of these instructions and education.  Patient Consent Given: Yes Education handout provided: Previously provided Muscles treated: bilateral thoracic paraspinals (2 left, 1 right) Electrical stimulation performed: No Parameters: N/A Treatment response/outcome: good tolerance       02/22/22 Reassess MMT AROM  FOTO: 48% function Chin tuck x5 Scap retraction x5 Cervical rotation x5  Manual STM to bilateral upper trap pre and post dry needling for trigger point identification and surface area preparation   Trigger Point Dry-Needling  Treatment instructions: Expect mild to moderate muscle soreness. S/S of pneumothorax if dry needled over a lung field, and to seek immediate medical attention should they occur.  Patient verbalized understanding of these instructions and education.  Patient Consent Given: Yes Education handout provided: Previously provided Muscles treated: bilateral upper trap Electrical stimulation performed: No Parameters: N/A Treatment response/outcome: good tolerance, several twitches noted RT>LT   02/07/22  DRY NEEDLING: Dry needling consent given? yes Educational handouts provided? Previously given  Muscles treated: Lower R lumbar multifidi    Response from dry needling: good tolerance   Manual STM to bilateral lumbar paraspinals pre and post dry needling for trigger point identification and surface area preparation   DKTC with heels on green ball 10 x 5 second holds; 2 sets Prone hip extension 2x 10 bilateral     PATIENT EDUCATION:  Education details: exercise form and function Person educated: Patient Education method: Explanation, Demonstration, and Handouts Education comprehension: verbalized understanding, returned demonstration, verbal cues required, and tactile cues required       HOME EXERCISE PROGRAM: 01/11/22 Ab march SLR [employer]   Access Code: [email or web address] URL: [email or web address] Date: 01/01/2022 Prepared by:    Exercises - Sidelying Thoracic and Shoulder Rotation  - 2-3 x daily - 7 x weekly - 1 sets - 10 reps - 5-10 second hold - Supine Piriformis Stretch with Foot on Ground  - 2-3 x daily - 7 x weekly - 1 sets - 3 reps - 20-30 second holdExercises - Seated Thoracic Lumbar Extension with Pectoralis Stretch  - 1 x daily - 7 x weekly - 1 sets - 10 reps - Shoulder Extension with Resistance  - 1 x daily - 7 x weekly - 3 sets - 10 reps - Scapular Retraction with Resistance  - 1 x daily - 7 x weekly - 3 sets - 10 reps - Seated Hamstring Stretch with Strap  - 1 x daily - 7 x weekly - 1 sets - 3 reps - 30 sec hold - Slant Board Gastrocnemius Stretch  - 1 x daily - 7 x weekly - 1 sets - 3 reps - 30 sec hold   Exercises -  Supine Bridge  - 2 x daily - 7 x weekly - 1-2 sets - 10 reps - 5 second hold - Supine March  - 2 x daily - 7 x weekly - 1-2 sets - 10 reps - Supine Lower Trunk Rotation  - 2 x daily - 7 x weekly - 1 sets - 10 reps - 5 second hold3/30/23 press up  02/22/22 Chin tuck  Scap retraction  Cervical rotation    ASSESSMENT:   CLINICAL IMPRESSION: Progressed scapular strengthening activity. Patient tolerated well. Noting increased muscle fatigue in scapular region. Patient educated on purpose and function of all added exercise. Performed dry needling to thoracic paraspinals per patein subjective and noting palpable restriction. Patient with good tolerance noting improved symptoms following. Patient will continue to benefit from skilled therapy services to reduce remaining deficits and improve functional ability.    OBJECTIVE IMPAIRMENTS Abnormal gait, decreased activity tolerance, decreased endurance, decreased mobility, difficulty walking, decreased ROM, decreased strength, hypomobility, increased muscle spasms, impaired flexibility, improper body mechanics, postural dysfunction, and pain.    ACTIVITY LIMITATIONS cleaning, community activity, driving, meal prep, laundry, yard work, shopping, and yard work.    PERSONAL FACTORS Fitness, Past/current experiences, Time since onset of injury/illness/exacerbation, and 3+ comorbidities: chronic back pain, DM, increased BMI, cardiac   are also affecting patient's functional outcome.      REHAB POTENTIAL: Good   CLINICAL DECISION MAKING: Stable/uncomplicated   EVALUATION COMPLEXITY: Low     GOALS: Goals reviewed with patient? Yes   SHORT TERM GOALS: Target date: 01/04/2022   Patient will be independent with HEP in order to improve functional outcomes. Baseline:  Goal status: MET    2.  Patient will report at least 25% improvement in symptoms for improved quality of life. Baseline: Reports 60% Goal status: MET       LONG TERM GOALS: Target    date: 01/25/2022   Patient will report at least 75% improvement in (lumbar) symptoms for improved quality of life. Baseline: Reports 75%  Goal status: MET (modified)   2.  Patient will improve FOTO score by at least 10 points in order to indicate improved tolerance to activity. Baseline: Improved 5%  Goal status: Ongoing   3.  Patient will demonstrate at least 25% improvement in lumbar ROM in all restricted planes for improved ability to move trunk while completing chores. Baseline: See AROM  Goal status: Partially MET   4.  Patient will be able to ambulate at least 450 feet in 2MWT with pain no greater than 2/10 in order to demonstrate improved tolerance to activity. Baseline: Distance no issue but baseline 5/10 pain  Goal status: Partially MET  5. Patient improve RT cervical rotation by at least 10 degrees in order to improve ability to scan environment for safety and while driving. Baseline: 25 degrees Goal status: NEW  6.  Patient will report at least 75% improvement in (cervical) symptoms for improved quality of life. Baseline:  Goal status: NEW  PLAN: PT FREQUENCY: 1-2x/week   PT DURATION: 6 weeks   PLANNED INTERVENTIONS: PLANNED INTERVENTIONS: Therapeutic exercises, Therapeutic activity, Neuromuscular re-education, Balance training, Gait training, Patient/Family education, Joint manipulation, Joint mobilization, Stair training, Orthotic/Fit training, DME instructions, Aquatic Therapy, Dry Needling, Electrical stimulation, Spinal manipulation, Spinal mobilization, Cryotherapy, Moist heat, Compression bandaging, scar mobilization, Splintting, Taping, Traction, Ultrasound, Ionotophoresis 4mg/ml Dexamethasone, and Manual therapy     PLAN FOR NEXT SESSION: cervical and scapular strengthening. Continue manual and DN for mobility/pain. Continue extension based lumbar movements as indicated. Core and glute strength, postural strength   9:00 AM, 02/27/22   PT DPT   Physical Therapist with [facility]  [phone]      

## 2022-03-01 ENCOUNTER — Encounter (HOSPITAL_COMMUNITY): Payer: Medicare Other | Admitting: Physical Therapy

## 2022-03-06 ENCOUNTER — Encounter (HOSPITAL_COMMUNITY): Payer: Self-pay

## 2022-03-06 ENCOUNTER — Emergency Department (HOSPITAL_COMMUNITY): Payer: Medicare Other

## 2022-03-06 ENCOUNTER — Other Ambulatory Visit: Payer: Self-pay

## 2022-03-06 ENCOUNTER — Ambulatory Visit (HOSPITAL_COMMUNITY): Payer: Medicare Other | Attending: Physical Therapy | Admitting: Physical Therapy

## 2022-03-06 ENCOUNTER — Observation Stay (HOSPITAL_COMMUNITY)
Admission: EM | Admit: 2022-03-06 | Discharge: 2022-03-08 | Disposition: A | Discharge status: Home / Self Care | Payer: Medicare Other | Attending: Family Medicine | Admitting: Family Medicine

## 2022-03-06 DIAGNOSIS — Z7982 Long term (current) use of aspirin: Secondary | ICD-10-CM | POA: Insufficient documentation

## 2022-03-06 DIAGNOSIS — R072 Precordial pain: Secondary | ICD-10-CM | POA: Diagnosis not present

## 2022-03-06 DIAGNOSIS — R079 Chest pain, unspecified: Secondary | ICD-10-CM | POA: Diagnosis present

## 2022-03-06 DIAGNOSIS — E782 Mixed hyperlipidemia: Secondary | ICD-10-CM | POA: Diagnosis present

## 2022-03-06 DIAGNOSIS — Z79899 Other long term (current) drug therapy: Secondary | ICD-10-CM | POA: Diagnosis not present

## 2022-03-06 DIAGNOSIS — R0602 Shortness of breath: Secondary | ICD-10-CM | POA: Diagnosis not present

## 2022-03-06 DIAGNOSIS — Z7985 Long-term (current) use of injectable non-insulin antidiabetic drugs: Secondary | ICD-10-CM | POA: Diagnosis not present

## 2022-03-06 DIAGNOSIS — Z87891 Personal history of nicotine dependence: Secondary | ICD-10-CM | POA: Diagnosis not present

## 2022-03-06 DIAGNOSIS — K7581 Nonalcoholic steatohepatitis (NASH): Secondary | ICD-10-CM | POA: Diagnosis present

## 2022-03-06 DIAGNOSIS — Z96651 Presence of right artificial knee joint: Secondary | ICD-10-CM | POA: Insufficient documentation

## 2022-03-06 DIAGNOSIS — Z955 Presence of coronary angioplasty implant and graft: Secondary | ICD-10-CM | POA: Diagnosis not present

## 2022-03-06 DIAGNOSIS — I10 Essential (primary) hypertension: Secondary | ICD-10-CM | POA: Diagnosis present

## 2022-03-06 DIAGNOSIS — I25118 Atherosclerotic heart disease of native coronary artery with other forms of angina pectoris: Secondary | ICD-10-CM

## 2022-03-06 DIAGNOSIS — I2510 Atherosclerotic heart disease of native coronary artery without angina pectoris: Secondary | ICD-10-CM | POA: Diagnosis not present

## 2022-03-06 DIAGNOSIS — R0789 Other chest pain: Principal | ICD-10-CM | POA: Insufficient documentation

## 2022-03-06 DIAGNOSIS — Z7984 Long term (current) use of oral hypoglycemic drugs: Secondary | ICD-10-CM | POA: Diagnosis not present

## 2022-03-06 DIAGNOSIS — Z794 Long term (current) use of insulin: Secondary | ICD-10-CM | POA: Insufficient documentation

## 2022-03-06 DIAGNOSIS — K219 Gastro-esophageal reflux disease without esophagitis: Secondary | ICD-10-CM | POA: Diagnosis present

## 2022-03-06 DIAGNOSIS — E1140 Type 2 diabetes mellitus with diabetic neuropathy, unspecified: Secondary | ICD-10-CM | POA: Insufficient documentation

## 2022-03-06 DIAGNOSIS — R42 Dizziness and giddiness: Secondary | ICD-10-CM | POA: Diagnosis not present

## 2022-03-06 DIAGNOSIS — J449 Chronic obstructive pulmonary disease, unspecified: Secondary | ICD-10-CM | POA: Diagnosis not present

## 2022-03-06 DIAGNOSIS — E119 Type 2 diabetes mellitus without complications: Secondary | ICD-10-CM

## 2022-03-06 LAB — CBC
HCT: 47.8 % (ref 39.0–52.0)
Hemoglobin: 16.6 g/dL (ref 13.0–17.0)
MCH: 30.5 pg (ref 26.0–34.0)
MCHC: 34.7 g/dL (ref 30.0–36.0)
MCV: 87.7 fL (ref 80.0–100.0)
Platelets: 171 10*3/uL (ref 150–400)
RBC: 5.45 MIL/uL (ref 4.22–5.81)
RDW: 13.3 % (ref 11.5–15.5)
WBC: 7.6 10*3/uL (ref 4.0–10.5)
nRBC: 0 % (ref 0.0–0.2)

## 2022-03-06 LAB — BASIC METABOLIC PANEL
Anion gap: 8 (ref 5–15)
BUN: 16 mg/dL (ref 6–20)
CO2: 24 mmol/L (ref 22–32)
Calcium: 10 mg/dL (ref 8.9–10.3)
Chloride: 105 mmol/L (ref 98–111)
Creatinine, Ser: 0.76 mg/dL (ref 0.61–1.24)
GFR, Estimated: >60 mL/min (ref >60)
Glucose, Bld: 174 mg/dL — ABNORMAL HIGH (ref 70–99)
Potassium: 3.9 mmol/L (ref 3.5–5.1)
Sodium: 137 mmol/L (ref 135–145)

## 2022-03-06 LAB — TROPONIN I (HIGH SENSITIVITY)
Troponin I (High Sensitivity): <2 ng/L (ref <18)
Troponin I (High Sensitivity): <2 ng/L (ref <18)
Troponin I (High Sensitivity): <2 ng/L (ref <18)

## 2022-03-06 LAB — GLUCOSE, CAPILLARY: Glucose-Capillary: 161 mg/dL — ABNORMAL HIGH (ref 70–99)

## 2022-03-06 LAB — D-DIMER, QUANTITATIVE: D-Dimer, Quant: <0.27 ug/mL-FEU (ref 0.00–0.50)

## 2022-03-06 MED ORDER — ACETAMINOPHEN 325 MG PO TABS
650.0000 mg | ORAL_TABLET | Freq: Four times a day (QID) | ORAL | Status: DC | PRN
Start: 2022-03-06 — End: 2022-03-08
  Administered 2022-03-06 – 2022-03-08 (×3): 650 mg via ORAL
  Filled 2022-03-06 (×3): qty 2

## 2022-03-06 MED ORDER — NITROGLYCERIN 0.4 MG SL SUBL
0.4000 mg | SUBLINGUAL_TABLET | SUBLINGUAL | Status: DC | PRN
Start: 2022-03-06 — End: 2022-03-08

## 2022-03-06 MED ORDER — IPRATROPIUM-ALBUTEROL 0.5-2.5 (3) MG/3ML IN SOLN
3.0000 mL | Freq: Once | RESPIRATORY_TRACT | Status: AC
  Administered 2022-03-06: 3 mL via RESPIRATORY_TRACT
  Filled 2022-03-06: qty 3

## 2022-03-06 MED ORDER — MAGNESIUM OXIDE -MG SUPPLEMENT 400 (240 MG) MG PO TABS
400.0000 mg | ORAL_TABLET | Freq: Every day | ORAL | Status: DC
  Administered 2022-03-06 – 2022-03-08 (×3): 400 mg via ORAL
  Filled 2022-03-06 (×3): qty 1

## 2022-03-06 MED ORDER — PANTOPRAZOLE SODIUM 40 MG PO TBEC
80.0000 mg | DELAYED_RELEASE_TABLET | Freq: Every day | ORAL | Status: DC
  Administered 2022-03-07 – 2022-03-08 (×2): 80 mg via ORAL
  Filled 2022-03-06 (×2): qty 2

## 2022-03-06 MED ORDER — GABAPENTIN 300 MG PO CAPS
600.0000 mg | ORAL_CAPSULE | Freq: Two times a day (BID) | ORAL | Status: DC
  Administered 2022-03-06 – 2022-03-08 (×4): 600 mg via ORAL
  Filled 2022-03-06 (×4): qty 2

## 2022-03-06 MED ORDER — BUTALBITAL-APAP-CAFFEINE 50-325-40 MG PO TABS
2.0000 | ORAL_TABLET | Freq: Once | ORAL | Status: AC
  Administered 2022-03-06: 2 via ORAL
  Filled 2022-03-06: qty 2

## 2022-03-06 MED ORDER — DOXYLAMINE SUCCINATE (SLEEP) 25 MG PO TABS: 12.5000 mg | ORAL_TABLET | Freq: Two times a day (BID) | ORAL | Status: DC | PRN

## 2022-03-06 MED ORDER — ENOXAPARIN SODIUM 40 MG/0.4ML IJ SOSY
40.0000 mg | PREFILLED_SYRINGE | INTRAMUSCULAR | Status: DC
  Administered 2022-03-06 – 2022-03-07 (×2): 40 mg via SUBCUTANEOUS
  Filled 2022-03-06 (×2): qty 0.4

## 2022-03-06 MED ORDER — NITROGLYCERIN 2 % TD OINT
0.5000 [in_us] | TOPICAL_OINTMENT | Freq: Once | TRANSDERMAL | Status: AC
  Administered 2022-03-06: 0.5 [in_us] via TOPICAL
  Filled 2022-03-06: qty 1

## 2022-03-06 MED ORDER — FLUTICASONE PROPIONATE 50 MCG/ACT NA SUSP: 2.0000 | Freq: Every day | NASAL | Status: DC | PRN

## 2022-03-06 MED ORDER — ONDANSETRON 4 MG PO TBDP
8.0000 mg | ORAL_TABLET | Freq: Three times a day (TID) | ORAL | Status: DC | PRN
Start: 2022-03-06 — End: 2022-03-08

## 2022-03-06 MED ORDER — LORATADINE 10 MG PO TABS
10.0000 mg | ORAL_TABLET | Freq: Every day | ORAL | Status: DC
  Administered 2022-03-07 – 2022-03-08 (×2): 10 mg via ORAL
  Filled 2022-03-06 (×2): qty 1

## 2022-03-06 MED ORDER — TRAZODONE HCL 50 MG PO TABS
50.0000 mg | ORAL_TABLET | Freq: Every evening | ORAL | Status: DC | PRN
  Administered 2022-03-06: 50 mg via ORAL
  Filled 2022-03-06: qty 1

## 2022-03-06 MED ORDER — ONDANSETRON HCL 4 MG/2ML IJ SOLN: 4.0000 mg | Freq: Four times a day (QID) | INTRAMUSCULAR | Status: DC | PRN

## 2022-03-06 MED ORDER — INSULIN GLARGINE-YFGN 100 UNIT/ML ~~LOC~~ SOLN
40.0000 [IU] | Freq: Every day | SUBCUTANEOUS | Status: DC
  Administered 2022-03-06 – 2022-03-07 (×2): 40 [IU] via SUBCUTANEOUS
  Filled 2022-03-06 (×3): qty 0.4

## 2022-03-06 MED ORDER — METFORMIN HCL 500 MG PO TABS: 1000.0000 mg | ORAL_TABLET | Freq: Two times a day (BID) | ORAL | Status: DC

## 2022-03-06 MED ORDER — INSULIN GLARGINE (2 UNIT DIAL) 300 UNIT/ML ~~LOC~~ SOPN: 40.0000 [IU] | PEN_INJECTOR | Freq: Every day | SUBCUTANEOUS | Status: DC

## 2022-03-06 MED ORDER — DAPAGLIFLOZIN PROPANEDIOL 5 MG PO TABS
5.0000 mg | ORAL_TABLET | Freq: Every day | ORAL | Status: DC
  Administered 2022-03-08: 5 mg via ORAL
  Filled 2022-03-06 (×3): qty 1

## 2022-03-06 MED ORDER — PROPRANOLOL HCL 20 MG PO TABS
20.0000 mg | ORAL_TABLET | Freq: Every day | ORAL | Status: DC
  Administered 2022-03-08: 20 mg via ORAL
  Filled 2022-03-06: qty 1

## 2022-03-06 MED ORDER — ALPRAZOLAM 1 MG PO TABS
1.0000 mg | ORAL_TABLET | Freq: Three times a day (TID) | ORAL | Status: DC | PRN
Start: 2022-03-06 — End: 2022-03-08
  Administered 2022-03-07 – 2022-03-08 (×2): 1 mg via ORAL
  Filled 2022-03-06 (×2): qty 1

## 2022-03-06 MED ORDER — ALBUTEROL SULFATE (2.5 MG/3ML) 0.083% IN NEBU: 2.5000 mg | INHALATION_SOLUTION | Freq: Four times a day (QID) | RESPIRATORY_TRACT | Status: DC | PRN

## 2022-03-06 MED ORDER — IOHEXOL 350 MG/ML SOLN
100.0000 mL | Freq: Once | INTRAVENOUS | Status: AC | PRN
Start: 2022-03-06 — End: 2022-03-06
  Administered 2022-03-06: 100 mL via INTRAVENOUS

## 2022-03-06 MED ORDER — SERTRALINE HCL 50 MG PO TABS
150.0000 mg | ORAL_TABLET | Freq: Every day | ORAL | Status: DC
  Administered 2022-03-07 – 2022-03-08 (×2): 150 mg via ORAL
  Filled 2022-03-06 (×2): qty 3

## 2022-03-06 MED ORDER — VITAMIN B-6 25 MG PO TABS
12.5000 mg | ORAL_TABLET | Freq: Two times a day (BID) | ORAL | Status: DC | PRN
Start: 2022-03-06 — End: 2022-03-08

## 2022-03-06 MED ORDER — LOSARTAN POTASSIUM 50 MG PO TABS
25.0000 mg | ORAL_TABLET | Freq: Every day | ORAL | Status: DC
Start: 2022-03-07 — End: 2022-03-07

## 2022-03-06 MED ORDER — EZETIMIBE 10 MG PO TABS
10.0000 mg | ORAL_TABLET | Freq: Every day | ORAL | Status: DC
  Administered 2022-03-07 – 2022-03-08 (×2): 10 mg via ORAL
  Filled 2022-03-06 (×2): qty 1

## 2022-03-06 MED ORDER — ALBUTEROL SULFATE HFA 108 (90 BASE) MCG/ACT IN AERS: 2.0000 | INHALATION_SPRAY | Freq: Four times a day (QID) | RESPIRATORY_TRACT | Status: DC | PRN

## 2022-03-06 MED ORDER — SODIUM CHLORIDE 0.9 % IV SOLN: INTRAVENOUS | Status: DC

## 2022-03-06 MED ORDER — COLESTIPOL HCL 1 G PO TABS
2.0000 g | ORAL_TABLET | Freq: Every day | ORAL | Status: DC
  Administered 2022-03-08: 2 g via ORAL
  Filled 2022-03-06 (×4): qty 2

## 2022-03-06 MED ORDER — ASPIRIN 81 MG PO TBEC
81.0000 mg | DELAYED_RELEASE_TABLET | Freq: Every day | ORAL | Status: DC
  Administered 2022-03-07 – 2022-03-08 (×2): 81 mg via ORAL
  Filled 2022-03-06 (×2): qty 1

## 2022-03-06 MED ORDER — ATORVASTATIN CALCIUM 40 MG PO TABS: 40.0000 mg | ORAL_TABLET | Freq: Every day | ORAL | Status: DC

## 2022-03-06 MED ORDER — RANOLAZINE ER 500 MG PO TB12
1000.0000 mg | ORAL_TABLET | Freq: Two times a day (BID) | ORAL | Status: DC
  Administered 2022-03-06 – 2022-03-08 (×4): 1000 mg via ORAL
  Filled 2022-03-06 (×4): qty 2

## 2022-03-06 MED ORDER — MOMETASONE FURO-FORMOTEROL FUM 200-5 MCG/ACT IN AERO
2.0000 | INHALATION_SPRAY | Freq: Two times a day (BID) | RESPIRATORY_TRACT | Status: DC
  Administered 2022-03-06 – 2022-03-08 (×4): 2 via RESPIRATORY_TRACT
  Filled 2022-03-06: qty 8.8

## 2022-03-06 MED ORDER — DOXYLAMINE SUCCINATE (SLEEP) 25 MG PO TABS: 1.0000 | ORAL_TABLET | Freq: Two times a day (BID) | ORAL | Status: DC | PRN

## 2022-03-06 MED ORDER — METHOCARBAMOL 500 MG PO TABS
500.0000 mg | ORAL_TABLET | Freq: Three times a day (TID) | ORAL | Status: DC | PRN
Start: 2022-03-06 — End: 2022-03-08

## 2022-03-06 NOTE — ED Provider Triage Note (Signed)
Emergency Medicine Provider Triage Evaluation Note  Jared Griffin , a 61 y.o. male  was evaluated in triage.  Pt complains of left-sided chest pain for the past 3 to 4 days.  Patient reports intermittent chest pain although become more constant over the past 2 days.  He has tried nitro with relief, however did not take any today.  He additionally reports some shortness of breath and reports he has been having to use his inhaler more often.  Does have history of COPD/asthma.  He additionally reports some lightheadedness although denies any syncope.  Review of Systems  Positive:  Negative:   Physical Exam  BP 138/82 (BP Location: Right Arm)   Pulse 84   Temp 97.7 F (36.5 C) (Oral)   Resp 20   Ht 6' 2"  (1.88 m)   Wt 117.9 kg   SpO2 96%   BMI 33.38 kg/m  Gen:   Awake, no distress   Resp:  Normal effort  MSK:   Moves extremities without difficulty  Other:  RRR, symmetric pulses  Medical Decision Making  Medically screening exam initiated at 12:08 PM.  Appropriate orders placed.  PARNELL SPIELER was informed that the remainder of the evaluation will be completed by another provider, this initial triage assessment does not replace that evaluation, and the importance of remaining in the ED until their evaluation is complete.  Chest pain work-up initiated in triage.   Sherrell Puller, [place] 03/06/22 1208

## 2022-03-06 NOTE — Assessment & Plan Note (Signed)
Labs are stable.

## 2022-03-06 NOTE — H&P (Signed)
History and Physical    [name]:[id number] DOB: [birth year] DOA: 03/06/2022  DOS: the patient was seen and examined on 03/06/2022  PCP: [name], MD   Patient coming from: Home  I have personally briefly reviewed patient's old medical records in [place] is a 61 y.o. male with a hx of CAD with history of mLAD PCI in 11/2013, HTN, HLD, DMII, COPD, anxiety and depression who follows with Dr. [name]. Last cath in 2017 showed mild-to-moderate mLAD stenosis beyond the stent with no significant in-stent restenosis. Last TTE 07/2020 with LVEF 60-65%, no WMA, normal RV, no significant valve disease. Was last seen in clinic by Dr. [name] on 01/2022 where he was doing well from a CV.   Patient reports that he has had persistent chest pain and SOB since Friday along with increased fatigue. Symptoms were intermittent but became constant. SLG NTG relieved symptoms. Due to persistent symptoms he presents to [facility] evaluation.   ED Course: afebrile  106/81. Patient's chest pain continued but lessened. Lab: Cmet remarkable for glucose 174, Mg 1.4, CBCD nl, D-dimer <0.27. Troponins <2 x 2. EKG without acute changes. Exam was unremarkable. Dr. Johney Frame consulted: recommended r/o PE. Asked that [name] admit and arrange for TTE as well as close monitoring.   Review of Systems:  Review of Systems  Constitutional:  Negative for chills, fever and weight loss.  HENT: Negative.    Eyes: Negative.   Respiratory:  Positive for shortness of breath.        DOE, no wheezing. Not SOB at rest  Cardiovascular:  Positive for chest pain. Negative for palpitations, orthopnea, leg swelling and PND.  Gastrointestinal:  Positive for heartburn. Negative for abdominal pain, diarrhea, nausea and vomiting.  Genitourinary: Negative.   Musculoskeletal: Negative.   Skin: Negative.   Neurological: Negative.   Endo/Heme/Allergies: Negative.   Psychiatric/Behavioral: Negative.       Past Medical History:  Diagnosis Date   Anxiety    COPD (chronic obstructive pulmonary disease) ([place])    Coronary atherosclerosis of native coronary artery    a. 12/09/2013: s/p PCI with 3.0 x 28 Promus DES to mLAD   Depression    Diabetic neuropathy ([place])    Elbow injury 07/25/2017   Surgery for torn tendon   Essential hypertension    Fatty liver disease, nonalcoholic    GERD (gastroesophageal reflux disease)    Iron deficiency anemia 08/15/2021   Lateral epicondylitis of right elbow    Mixed hyperlipidemia    Obesity    Osgood-Schlatter's disease of right knee    Skin cyst 10/03/2017   Removed from back   Type 2 diabetes mellitus ([place])     Past Surgical History:  Procedure Laterality Date   BIOPSY  01/21/2019   Procedure: BIOPSY;  Surgeon: [name], MD;  [facility];  Service: Endoscopy;;  gastric bx and gastric polyps   BIOPSY  11/08/2020   Procedure: BIOPSY;  Surgeon: [name], MD;  [facility];  Service: Gastroenterology;;   CARDIAC CATHETERIZATION  2011   CARDIAC CATHETERIZATION N/A 09/20/2015   Procedure: Left Heart Cath and Coronary Angiography;  Surgeon: [name], MD;  Location: [place] [facility];  Service: Cardiovascular;  Laterality: N/A;   CHOLECYSTECTOMY  2003   COLONOSCOPY  06/19/2012   Procedure: COLONOSCOPY;  Surgeon: [name], MD;  [facility];  Service: Endoscopy;  Laterality: N/A;  930  COLONOSCOPY N/A 10/17/2017   Procedure: COLONOSCOPY;  Surgeon: [name], MD;  [facility];  Service: Endoscopy;  Laterality: N/A;  1225   COLONOSCOPY WITH PROPOFOL N/A 11/08/2020   Procedure: COLONOSCOPY WITH PROPOFOL;  Surgeon: [name], MD;  [facility];  Service: Gastroenterology;  Laterality: N/A;  10:45   COLONOSCOPY WITH PROPOFOL N/A 04/04/2021   Procedure: COLONOSCOPY WITH PROPOFOL;  Surgeon: [name], MD;  [facility];  Service: Gastroenterology;  Laterality: N/A;   CYST EXCISION  07/2019   ESOPHAGOGASTRODUODENOSCOPY (EGD) WITH PROPOFOL N/A 01/21/2019   Procedure: ESOPHAGOGASTRODUODENOSCOPY (EGD) WITH PROPOFOL;  Surgeon: [name], MD;  [facility];  Service: Endoscopy;  Laterality: N/A;  10:15am   ESOPHAGOGASTRODUODENOSCOPY (EGD) WITH PROPOFOL N/A 02/17/2021   Procedure: ESOPHAGOGASTRODUODENOSCOPY (EGD) WITH PROPOFOL;  Surgeon: [name], MD;  [facility];  Service: Gastroenterology;  Laterality: N/A;  10:20   ESOPHAGOGASTRODUODENOSCOPY (EGD) WITH PROPOFOL N/A 04/04/2021   Procedure: ESOPHAGOGASTRODUODENOSCOPY (EGD) WITH PROPOFOL;  Surgeon: [name], MD;  [facility];  Service: Gastroenterology;  Laterality: N/A;  12:30   GIVENS CAPSULE STUDY N/A 09/13/2021   Procedure: GIVENS CAPSULE STUDY;  Surgeon: [name], MD;  [facility];  Service: Gastroenterology;  Laterality: N/A;  7:30   KNEE ARTHROPLASTY Right 1999   LEFT HEART CATH AND CORONARY ANGIOGRAPHY N/A 02/26/2018   Procedure: LEFT HEART CATH AND CORONARY ANGIOGRAPHY;  Surgeon: [name], MD;  Location: [place] [facility];  Service: Cardiovascular;  Laterality: N/A;   LEFT HEART CATHETERIZATION WITH CORONARY ANGIOGRAM N/A 12/09/2013   Procedure: LEFT HEART CATHETERIZATION WITH CORONARY ANGIOGRAM;  Surgeon: [name], MD;  Location: [facility];  Service: Cardiovascular;  Laterality: N/A;   Liver biopsy     POLYPECTOMY  10/17/2017   Procedure: POLYPECTOMY;  Surgeon: [name], MD;  [facility];  Service: Endoscopy;;  ascending colon, splenic flexure,sigmoid x2   POLYPECTOMY  11/08/2020   Procedure: POLYPECTOMY;  Surgeon: [name], MD;  [facility];  Service: Gastroenterology;;   POLYPECTOMY  04/04/2021   Procedure: POLYPECTOMY;  Surgeon: [name], MD;  [facility];  Service: Gastroenterology;;   TENNIS ELBOW RELEASE/NIRSCHEL PROCEDURE Right 07/25/2017   Procedure: TENNIS ELBOW RELEASE and debriedment;  Surgeon: [name], MD;  Location: [facility];  Service: Orthopedics;  Laterality: Right;   TONSILLECTOMY  1970's    Soc Hx - 1st marriage 8 years - divorced. 2nd marriage 21 years. He has one son. 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 Lives with wife and son.   reports that he quit smoking about 38 years ago. His smoking use included cigarettes. He started smoking about 49 years ago. He has a 5.00 pack-year smoking history. He quit smokeless tobacco use about 38 years ago.  His smokeless tobacco use included chew. He reports that he does not drink alcohol and does not use drugs.  Allergies  Allergen Reactions   Doxycycline     Severe esophagitis    Family History  Problem Relation Age of Onset   Osteoporosis Mother    Cancer Maternal Aunt    CVA Maternal Grandmother    CVA Maternal Grandfather    Healthy Son     Prior to Admission medications   Medication  Sig Start Date End Date Taking? Authorizing Provider  acetaminophen (TYLENOL) 500 MG tablet Take 1,000 mg by mouth every 8 (eight) hours as needed for moderate pain.   Yes [provider]  albuterol (VENTOLIN HFA) 108 (90 Base) MCG/ACT inhaler INHALE 2 PUFFS INTO LUNGS EVERY 6 HOURS AS NEEDED FOR WHEEZING AND SHORTNESS OF BREATH. Patient taking differently: Inhale 2 puffs into the lungs every 6 (six) hours as needed for shortness of breath or wheezing. INHALE 2 PUFFS INTO LUNGS EVERY 6 HOURS AS NEEDED FOR WHEEZING AND SHORTNESS OF BREATH. 08/10/19  Yes Mikey Kirschner, MD  ALPRAZolam Duanne Moron) 1 MG tablet Take 1 tablet (1 mg total) by mouth 3 (three) times daily as needed for anxiety. 12/04/21 12/04/22 Yes Cloria Spring, MD  aspirin 81 MG EC tablet Take 1 tablet (81  mg total) by mouth daily. 01/22/19  Yes Rehman, Mechele Dawley, MD  atorvastatin (LIPITOR) 40 MG tablet TAKE ONE TABLET (40MG TOTAL) BY MOUTH DAILY Patient taking differently: Take 40 mg by mouth daily. 01/22/22  Yes Satira Sark, MD  cetirizine (ZYRTEC) 10 MG tablet Take 1 tablet (10 mg total) by mouth daily. 09/13/19  Yes Wurst, [place], PA-C  colestipol (COLESTID) 1 g tablet Take 2 tablets (2 g total) by mouth daily. Take 4 hours apart from other medication Patient taking differently: Take 2 g by mouth daily. Take 4 hours apart from other medication prn 09/07/21  Yes Montez Morita, Quillian Quince, MD  dapagliflozin propanediol (FARXIGA) 5 MG TABS tablet Take 5 mg by mouth daily. 03/31/20  Yes [provider]  ezetimibe (ZETIA) 10 MG tablet Take 1 tablet (10 mg total) by mouth daily. 02/09/22  Yes Luking, Elayne Snare, MD  fluticasone (FLONASE) 50 MCG/ACT nasal spray Place 2 sprays into both nostrils daily. Patient taking differently: Place 2 sprays into both nostrils daily as needed for allergies. 09/13/19  Yes Wurst, [place], PA-C  fluticasone-salmeterol (ADVAIR HFA) 366-29 MCG/ACT inhaler INHALE 2 PUFFS INTO THE LUNGS TWICE DAILY 09/20/21  Yes [name], MD  gabapentin (NEURONTIN) 300 MG capsule 2 qam and 2qevening 12/25/21  Yes Luking, Scott A, MD  HUMALOG KWIKPEN 200 UNIT/ML KwikPen Inject 30-35 Units into the skin 3 (three) times daily. 10/18/20  Yes [provider]  losartan (COZAAR) 25 MG tablet TAKE ONE (1) TABLET BY MOUTH EVERY DAY 01/22/22  Yes Satira Sark, MD  metFORMIN (GLUCOPHAGE) 500 MG tablet Take 1,000 mg by mouth 2 (two) times daily with a meal.   Yes [provider]  methocarbamol (ROBAXIN) 500 MG tablet Take 500 mg by mouth every 8 (eight) hours as needed for muscle spasms.   Yes [provider]  omeprazole (PRILOSEC) 40 MG capsule Take 1 capsule (40 mg total) by mouth daily. 04/04/21  Yes [name], MD  ondansetron (ZOFRAN-ODT) 8 MG  disintegrating tablet TAKE ONE TABLET (8MG TOTAL) BY MOUTH EVERY 8 HOURS AS NEEDED FRO NAUSEA OR VOMITING Patient taking differently: Take 8 mg by mouth every 8 (eight) hours as needed for nausea or vomiting. 10/30/21  Yes Luking, Elayne Snare, MD  propranolol (INDERAL) 20 MG tablet Take 1 tablet (20 mg total) by mouth 2 (two) times daily. Patient taking differently: Take 20 mg by mouth daily. 02/07/22  Yes Tat, Eustace Quail, DO  ranolazine (RANEXA) 1000 MG SR tablet TAKE ONE TABLET (1000MG TOTAL) BY MOUTH TWO TIMES DAILY Patient taking differently: Take 1,000 mg by mouth 2 (two) times daily. 08/23/21  Yes Satira Sark,  MD  Semaglutide,0.25 or 0.5MG/DOS, (OZEMPIC, 0.25 OR 0.5 MG/DOSE,) 2 MG/3ML SOPN 0.5 mg 02/06/22  Yes [provider]  sertraline (ZOLOFT) 100 MG tablet Take 1.5 tablets (150 mg total) by mouth daily. 12/04/21  Yes Cloria Spring, MD  sildenafil (VIAGRA) 100 MG tablet Take 0.5-1 tablets (50-100 mg total) by mouth daily as needed for erectile dysfunction. 02/07/22  Yes Luking, Scott A, MD  TOUJEO MAX SOLOSTAR 300 UNIT/ML Solostar Pen INJECT 130 UNITS INTO THE SKIN AT BEDTIME. Patient taking differently: Inject 40-50 Units into the skin at bedtime. 04/11/20  Yes Cassandria Anger, MD  traZODone (DESYREL) 50 MG tablet Take 1 tablet (50 mg total) by mouth at bedtime as needed. 04/28/21  Yes Cloria Spring, MD  B-D UF III MINI PEN NEEDLES 31G X 5 MM MISC USE UP TO 4 TIMES DAILY WITH INSULIN AS DIRECTED. 03/09/21   Nida, Marella Chimes, MD  Continuous Blood Gluc Sensor (FREESTYLE LIBRE 14 DAY SENSOR) MISC Inject 1 each into the skin every 14 (fourteen) days. Use as directed. 12/24/19   Cassandria Anger, MD  Doxylamine-Pyridoxine 10-10 MG TBEC Take 1 each by mouth every 12 (twelve) hours as needed (nausea). 09/07/21   [name], MD  Lancets MISC 1 each by Does not apply route 4 (four) times daily. 09/24/19   Cassandria Anger, MD  nitroGLYCERIN (NITROSTAT) 0.4 MG SL  tablet Place 1 tablet (0.4 mg total) under the tongue every 5 (five) minutes as needed. 07/28/21   Satira Sark, MD  Semaglutide,0.25 or 0.5MG/DOS, (OZEMPIC, 0.25 OR 0.5 MG/DOSE,) 2 MG/3ML SOPN See admin instructions. 12/15/21   [provider]    Physical Exam: Vitals:   03/06/22 1915 03/06/22 2000 03/06/22 2030 03/06/22 2039  BP: 106/81 105/71 117/84   Pulse: 82 84 79   Resp: (!) 21 (!) 27 17   Temp:    98.5 F (36.9 C)  TempSrc:    Oral  SpO2: 97% 93% 95%   Weight:      Height:        Physical Exam Vitals and nursing note reviewed.  Constitutional:      General: He is not in acute distress.    Appearance: He is well-developed. He is not ill-appearing or toxic-appearing.     Comments: overweight  HENT:     Head: Normocephalic and atraumatic.  Eyes:     Extraocular Movements: Extraocular movements intact.     Pupils: Pupils are equal, round, and reactive to light.  Neck:     Thyroid: No thyromegaly.     Vascular: No hepatojugular reflux.  Cardiovascular:     Rate and Rhythm: Normal rate and regular rhythm.     Pulses:          Carotid pulses are 2+ on the right side and 2+ on the left side.      Radial pulses are 2+ on the right side and 2+ on the left side.       Dorsalis pedis pulses are 2+ on the right side and 2+ on the left side.       Posterior tibial pulses are 2+ on the right side and 2+ on the left side.     Heart sounds: Normal heart sounds.  Pulmonary:     Effort: Pulmonary effort is normal. No tachypnea, accessory muscle usage or respiratory distress.     Breath sounds: Normal breath sounds.  Chest:     Chest wall: No mass or deformity.  Abdominal:     General: Bowel sounds are normal.     Palpations: Abdomen is soft.  Musculoskeletal:        General: Normal range of motion.     Cervical back: Normal range of motion and neck supple.  Skin:    General: Skin is warm and dry.  Neurological:     General: No focal deficit present.      Mental Status: He is alert and oriented to person, place, and time.     Cranial Nerves: No cranial nerve deficit.     Motor: No weakness.  Psychiatric:        Mood and Affect: Mood normal.        Behavior: Behavior normal.      Labs on Admission: I have personally reviewed following labs and imaging studies  CBC: Recent Labs  Lab 03/06/22 1221  WBC 7.6  HGB 16.6  HCT 47.8  MCV 87.7  PLT 334   Basic Metabolic Panel: Recent Labs  Lab 03/06/22 1221  NA 137  K 3.9  CL 105  CO2 24  GLUCOSE 174*  BUN 16  CREATININE 0.76  CALCIUM 10.0   GFR: Estimated Creatinine Clearance: 134 mL/min (by C-G formula based on SCr of 0.76 mg/dL). Liver Function Tests: No results for input(s): "AST", "ALT", "ALKPHOS", "BILITOT", "PROT", "ALBUMIN" in the last 168 hours. No results for input(s): "LIPASE", "AMYLASE" in the last 168 hours. No results for input(s): "AMMONIA" in the last 168 hours. Coagulation Profile: No results for input(s): "INR", "PROTIME" in the last 168 hours. Cardiac Enzymes: No results for input(s): "CKTOTAL", "CKMB", "CKMBINDEX", "TROPONINI" in the last 168 hours. BNP (last 3 results) No results for input(s): "PROBNP" in the last 8760 hours. HbA1C: No results for input(s): "HGBA1C" in the last 72 hours. CBG: No results for input(s): "GLUCAP" in the last 168 hours. Lipid Profile: No results for input(s): "CHOL", "HDL", "LDLCALC", "TRIG", "CHOLHDL", "LDLDIRECT" in the last 72 hours. Thyroid Function Tests: No results for input(s): "TSH", "T4TOTAL", "FREET4", "T3FREE", "THYROIDAB" in the last 72 hours. Anemia Panel: No results for input(s): "VITAMINB12", "FOLATE", "FERRITIN", "TIBC", "IRON", "RETICCTPCT" in the last 72 hours. Urine analysis:    Component Value Date/Time   COLORURINE YELLOW 09/11/2016 0216   APPEARANCEUR CLEAR 09/11/2016 0216   LABSPEC 1.024 09/11/2016 0216   PHURINE 5.0 09/11/2016 0216   GLUCOSEU >=500 (A) 09/11/2016 0216   HGBUR NEGATIVE  09/11/2016 0216   BILIRUBINUR NEGATIVE 09/11/2016 0216   KETONESUR 20 (A) 09/11/2016 0216   PROTEINUR NEGATIVE 09/11/2016 0216   NITRITE NEGATIVE 09/11/2016 0216   LEUKOCYTESUR NEGATIVE 09/11/2016 0216    Radiological Exams on Admission: I have personally reviewed images CT Angio Chest/Abd/Pel for Dissection W and/or Wo Contrast  Result Date: 03/06/2022 CLINICAL DATA:  Left-sided chest and shoulder pain x4 days with lightheadedness. EXAM: CT ANGIOGRAPHY CHEST, ABDOMEN AND PELVIS TECHNIQUE: Non-contrast CT of the chest was initially obtained. Multidetector CT imaging through the chest, abdomen and pelvis was performed using the standard protocol during bolus administration of intravenous contrast. Multiplanar reconstructed images and MIPs were obtained and reviewed to evaluate the vascular anatomy. RADIATION DOSE REDUCTION: This exam was performed according to the departmental dose-optimization program which includes automated exposure control, adjustment of the mA and/or kV according to patient size and/or use of iterative reconstruction technique. CONTRAST:  16m OMNIPAQUE IOHEXOL 350 MG/ML SOLN COMPARISON:  March 16, 2021 FINDINGS: CTA CHEST FINDINGS Cardiovascular: There is mild calcification of the thoracic aorta, without evidence of aortic  aneurysm or dissection. Satisfactory opacification of the pulmonary arteries to the segmental level. No evidence of pulmonary embolism. Normal heart size with marked severity coronary artery calcification. No pericardial effusion. Mediastinum/Nodes: No enlarged mediastinal, hilar, or axillary lymph nodes. Thyroid gland, trachea, and esophagus demonstrate no significant findings. Lungs/Pleura: Mild right upper lobe and posterolateral left lower lobe linear scarring and/or atelectasis is seen. There is no evidence of a pleural effusion or pneumothorax. Musculoskeletal: No chest wall abnormality. No acute or significant osseous findings. Review of the MIP images  confirms the above findings. CTA ABDOMEN AND PELVIS FINDINGS VASCULAR Aorta: Moderate severity calcification of a normal caliber aorta without aneurysm, dissection, vasculitis or significant stenosis. Celiac: Patent without evidence of aneurysm, dissection, vasculitis or significant stenosis. SMA: Patent without evidence of aneurysm, dissection, vasculitis or significant stenosis. Renals: Both renal arteries are patent without evidence of aneurysm, dissection, vasculitis, fibromuscular dysplasia or significant stenosis. IMA: Patent without evidence of aneurysm, dissection, vasculitis or significant stenosis. Inflow: Patent without evidence of aneurysm, dissection, vasculitis or significant stenosis. Veins: No obvious venous abnormality within the limitations of this arterial phase study. Review of the MIP images confirms the above findings. NON-VASCULAR Hepatobiliary: There is diffuse fatty infiltration of the liver parenchyma. No focal liver abnormality is seen. Status post cholecystectomy. No biliary dilatation. Pancreas: Unremarkable. No pancreatic ductal dilatation or surrounding inflammatory changes. Spleen: Normal in size without focal abnormality. Adrenals/Urinary Tract: Adrenal glands are unremarkable. Kidneys are normal in size, without renal calculi or hydronephrosis. Focal scarring is seen along the anterior aspect of the mid to lower left kidney. A 1.3 cm diameter simple cyst is seen within the mid right kidney. The urinary bladder is moderately distended and otherwise unremarkable. Stomach/Bowel: Stomach is within normal limits. Appendix appears normal. No evidence of bowel wall thickening, distention, or inflammatory changes. Lymphatic: No abnormal abdominal or pelvic lymph nodes are identified. Reproductive: Prostate is unremarkable. Other: There is a 3.3 cm x 2.6 cm fat containing right inguinal hernia. A 3.1 cm x 2.4 cm fat containing left inguinal hernia is also noted. Musculoskeletal: No acute or  significant osseous findings. Review of the MIP images confirms the above findings. IMPRESSION: 1. No evidence of aortic aneurysm or dissection. 2. No evidence of pulmonary embolism. 3. Marked severity coronary artery calcification. 4. Fatty infiltration of the liver. 5. Bilateral fat containing inguinal hernias. Aortic Atherosclerosis (ICD10-I70.0). Electronically Signed   By: Virgina Norfolk M.D.   On: 03/06/2022 18:34   DG Chest 2 View  Result Date: 03/06/2022 CLINICAL DATA:  Chest pain EXAM: CHEST - 2 VIEW COMPARISON:  03/14/2021 FINDINGS: Cardiomegaly. Both lungs are clear. Disc degenerative disease of the thoracic spine. IMPRESSION: Cardiomegaly without acute abnormality of the lungs. Electronically Signed   By: Delanna Ahmadi M.D.   On: 03/06/2022 12:19    EKG: I have personally reviewed EKG: NSR w/o acute changes  Assessment/Plan Principal Problem:   Atypical chest pain Active Problems:   CAD S/P LAD DES March 2015   DM2 (diabetes mellitus, type 2) (HCC)   Essential hypertension, benign   Esophageal reflux   NASH (nonalcoholic steatohepatitis)   Mixed hyperlipidemia    Assessment and Plan: * Atypical chest pain Patient with CAD s/p multiple caths, s/p PCI LAD stent. Last Cath 02/26/18 open LAD stent, EF 55-65%, mild non-obstruction disease ostial vessels. Now with persistent chest pain and SOB/DOE. Troponins <2 x 2, EKG w/o Acute changes. He is at high risk with DM, HLD, HTN prior disease. He has r/o for  PE by CTA. Dr. Gwyndolyn Kaufman has consulted on the patient.  Plan Chest pain obs admit to tele  Continue home meds  Cardiology to follow - possibly for R/L heart cath  CAD S/P LAD DES March 2015 Known CAD with multiple caths. Last saw Dr. [name] in May and was stable. Risk modification in place with good control of HLD, Last A1C 02/07/22 7.5%  Plan See plan for atypical chest pain  Continue risk modification   NASH (nonalcoholic steatohepatitis) Labs are  stable.  Esophageal reflux No report of esophagitis.  Plan Continue PPI    Essential hypertension, benign BP generally well controlled. BP 106/81 at [place] home meds - to resume in AM  DM2 (diabetes mellitus, type 2) ([place]) Patient adherent to medical regimen including basal insulin, SGLT2 inhibitor (Farxiga), meal time insulin, metformin. Last A1C 7.5% 02/07/22.  Plan Continue home regimen except meal time insulin, instead SS coverage.   Mixed hyperlipidemia Patient on multiple agents. Last lipid panel 02/07/22 T chol 172, HDL 38, LDL 51.  Plan Continue present regimen.        DVT prophylaxis: SQ Heparin Code Status: Full Code Family Communication: wife present during interview and exam  Disposition Plan: home when medically stable  Consults called: cardiology - Dr. Shary Key  Admission status: Observation, Telemetry bed   Adella Hare, MD [facility] 03/06/2022, 8:46 PM

## 2022-03-06 NOTE — Subjective & Objective (Signed)
Jared Griffin is a 61 y.o. male with a hx of CAD with history of mLAD PCI in 11/2013, HTN, HLD, DMII, COPD, anxiety and depression who follows with Dr. [name]. Last cath in 2017 showed mild-to-moderate mLAD stenosis beyond the stent with no significant in-stent restenosis. Last TTE 07/2020 with LVEF 60-65%, no WMA, normal RV, no significant valve disease. Was last seen in clinic by Dr. [name] on 01/2022 where he was doing well from a CV.   Patient reports that he has had persistent chest pain and SOB since Friday along with increased fatigue. Symptoms were intermittent but became constant. SLG NTG relieved symptoms. Due to persistent symptoms he presents to [facility] for evaluation.

## 2022-03-06 NOTE — Assessment & Plan Note (Addendum)
-  Resuming home meds

## 2022-03-06 NOTE — Consult Note (Signed)
Cardiology Consultation:   Patient ID: Jared Griffin MRN: [id number]; DOB: Aug 15, 1961  Admit date: 03/06/2022 Date of Consult: 03/06/2022  PCP:  [name], MD   [facility] Providers Cardiologist:  Rozann Lesches, MD   {      Patient Profile:   Jared Griffin is a 61 y.o. male with a hx of CAD with history of mLAD PCI in 11/2013, HTN, HLD, DMII, COPD, anxiety and depression who is being seen 03/06/2022 for the evaluation of chest pain and SOB at the request of Sherrell Puller, PA-C.  History of Present Illness:   Mr. Bouknight is a 61 year old male with history of known CAD with prior PCI to [place] in 2015 who follows with Dr. [name]. Last cath in 2017 showed mild-to-moderate mLAD stenosis beyond the stent with no significant in-stent restenosis. Last TTE 07/2020 with LVEF 60-65%, no WMA, normal RV, no significant valve disease. Was last seen in clinic by Dr. [name] on 01/2022 where he was doing well from a CV perspective.  The patient now presents on this admission with persistent chest pain and SOB that has been ongoing since this past Friday. Specifically, the patient states that he has been feeling more fatigued with low energy over the past several days and initially began to develop chest pressure last week. Symptoms were intermittent to begin with but then became constant in nature since Friday. Has some associated SOB. Symptoms improved with SL NTG but never fully went away. Not worsened with activity. He states he has essentially been laying in bed due to feeling so unwell. He decided to come to   In the ER, patient has had continuous chest pressure. Trop <2x2. ECG with NSR with no acute ischemic changes. CXR with cardiomegaly but no acute pathology. BMET unremarkable.   Currently, the patient states he continues to have significant chest pain like someone is "doing compressions on his chest." Also reports feeling short of breath. Feels slightly better with the nitro  paste.    Past Medical History:  Diagnosis Date   Anxiety    COPD (chronic obstructive pulmonary disease) ([place])    Coronary atherosclerosis of native coronary artery    a. 12/09/2013: s/p PCI with 3.0 x 28 Promus DES to mLAD   Depression    Diabetic neuropathy ([place])    Elbow injury 07/25/2017   Surgery for torn tendon   Essential hypertension    Fatty liver disease, nonalcoholic    GERD (gastroesophageal reflux disease)    Iron deficiency anemia 08/15/2021   Lateral epicondylitis of right elbow    Mixed hyperlipidemia    Obesity    Osgood-Schlatter's disease of right knee    Skin cyst 10/03/2017   Removed from back   Type 2 diabetes mellitus ([place])     Past Surgical History:  Procedure Laterality Date   BIOPSY  01/21/2019   Procedure: BIOPSY;  Surgeon: [name], MD;  [facility];  Service: Endoscopy;;  gastric bx and gastric polyps   BIOPSY  11/08/2020   Procedure: BIOPSY;  Surgeon: [name], MD;  [facility];  Service: Gastroenterology;;   CARDIAC CATHETERIZATION  2011   CARDIAC CATHETERIZATION N/A 09/20/2015   Procedure: Left Heart Cath and Coronary Angiography;  Surgeon: [name], MD;  Location: [place] [facility];  Service: Cardiovascular;  Laterality: N/A;   CHOLECYSTECTOMY  2003   COLONOSCOPY  06/19/2012   Procedure: COLONOSCOPY;  Surgeon: [name], MD;  [facility];  Service: Endoscopy;  Laterality: N/A;  930   COLONOSCOPY N/A 10/17/2017   Procedure: COLONOSCOPY;  Surgeon: [name], MD;  [facility];  Service: Endoscopy;  Laterality: N/A;  1225   COLONOSCOPY WITH PROPOFOL N/A 11/08/2020   Procedure: COLONOSCOPY WITH PROPOFOL;  Surgeon: [name], MD;  [facility];  Service: Gastroenterology;  Laterality: N/A;  10:45   COLONOSCOPY WITH PROPOFOL N/A 04/04/2021   Procedure: COLONOSCOPY WITH PROPOFOL;  Surgeon: [name], MD;  [facility];  Service: Gastroenterology;  Laterality: N/A;   CYST EXCISION  07/2019   ESOPHAGOGASTRODUODENOSCOPY (EGD) WITH PROPOFOL N/A 01/21/2019   Procedure: ESOPHAGOGASTRODUODENOSCOPY (EGD) WITH PROPOFOL;  Surgeon: [name], MD;  [facility];  Service: Endoscopy;  Laterality: N/A;  10:15am   ESOPHAGOGASTRODUODENOSCOPY (EGD) WITH PROPOFOL N/A 02/17/2021   Procedure: ESOPHAGOGASTRODUODENOSCOPY (EGD) WITH PROPOFOL;  Surgeon: [name], MD;  [facility];  Service: Gastroenterology;  Laterality: N/A;  10:20   ESOPHAGOGASTRODUODENOSCOPY (EGD) WITH PROPOFOL N/A 04/04/2021   Procedure: ESOPHAGOGASTRODUODENOSCOPY (EGD) WITH PROPOFOL;  Surgeon: [name], MD;  [facility];  Service: Gastroenterology;  Laterality: N/A;  12:30   GIVENS CAPSULE STUDY N/A 09/13/2021   Procedure: GIVENS CAPSULE STUDY;  Surgeon: [name], MD;  [facility];  Service: Gastroenterology;  Laterality: N/A;  7:30   KNEE ARTHROPLASTY Right 1999   LEFT HEART CATH AND CORONARY ANGIOGRAPHY N/A 02/26/2018   Procedure: LEFT HEART CATH AND CORONARY ANGIOGRAPHY;  Surgeon: [name], MD;  Location: [place] [facility];  Service: Cardiovascular;  Laterality: N/A;   LEFT HEART CATHETERIZATION WITH CORONARY ANGIOGRAM N/A 12/09/2013   Procedure: LEFT HEART CATHETERIZATION WITH CORONARY ANGIOGRAM;  Surgeon: [name], MD;  Location: [facility];  Service: Cardiovascular;  Laterality: N/A;   Liver biopsy     POLYPECTOMY  10/17/2017   Procedure: POLYPECTOMY;  Surgeon: [name], MD;  [facility];  Service: Endoscopy;;  ascending colon, splenic flexure,sigmoid x2   POLYPECTOMY  11/08/2020   Procedure: POLYPECTOMY;  Surgeon: [name], MD;  [facility];  Service: Gastroenterology;;   POLYPECTOMY  04/04/2021   Procedure: POLYPECTOMY;  Surgeon: [name], MD;  [facility];  Service: Gastroenterology;;   TENNIS ELBOW RELEASE/NIRSCHEL PROCEDURE Right 07/25/2017   Procedure: TENNIS ELBOW RELEASE and debriedment;  Surgeon: [name], MD;  Location: [facility];  Service: Orthopedics;  Laterality: Right;   TONSILLECTOMY  1970's     Home Medications:  Prior to Admission medications   Medication Sig Start Date End Date Taking? Authorizing Provider  acetaminophen (TYLENOL) 500 MG tablet Take 1,000 mg by mouth every 8 (eight) hours as needed for moderate pain.    [provider]  albuterol (VENTOLIN HFA) 108 (90 Base) MCG/ACT inhaler INHALE 2 PUFFS INTO LUNGS EVERY 6 HOURS AS NEEDED FOR WHEEZING AND SHORTNESS OF BREATH. Patient taking differently: Inhale 2 puffs into the lungs every 6 (six) hours as needed for shortness of breath or wheezing. INHALE 2 PUFFS INTO LUNGS EVERY 6 HOURS AS NEEDED FOR WHEEZING AND SHORTNESS OF BREATH. 08/10/19   Mikey Kirschner, MD  ALPRAZolam Duanne Moron) 1 MG tablet Take 1 tablet (1 mg total) by mouth 3 (three) times daily as needed for anxiety. 12/04/21 12/04/22  Cloria Spring, MD  aspirin 81 MG EC tablet Take 1 tablet (81 mg total) by  mouth daily. 01/22/19   [name], MD  atorvastatin (LIPITOR) 40 MG tablet TAKE ONE TABLET (40MG TOTAL) BY MOUTH DAILY 01/22/22   Satira Sark, MD  B-D UF III MINI PEN NEEDLES 31G X 5 MM MISC USE UP TO 4 TIMES DAILY WITH INSULIN AS DIRECTED. 03/09/21   Cassandria Anger, MD  cetirizine (ZYRTEC) 10 MG tablet Take 1 tablet (10 mg total) by mouth daily. 09/13/19   Wurst, [place], PA-C  colestipol (COLESTID) 1 g tablet Take 2 tablets (2 g total) by mouth daily. Take 4 hours apart from other medication Patient taking differently: Take 2 g by mouth daily. Take 4 hours apart from other medication prn 09/07/21   Montez Morita, Quillian Quince, MD  Continuous Blood Gluc Sensor (FREESTYLE LIBRE 14 DAY SENSOR) MISC Inject 1 each into the skin every 14 (fourteen) days. Use as directed. 12/24/19    Cassandria Anger, MD  dapagliflozin propanediol (FARXIGA) 5 MG TABS tablet Take 5 mg by mouth daily. 03/31/20   [provider]  Doxylamine-Pyridoxine 10-10 MG TBEC Take 1 each by mouth every 12 (twelve) hours as needed (nausea). 09/07/21   [name], MD  ezetimibe (ZETIA) 10 MG tablet Take 1 tablet (10 mg total) by mouth daily. 02/09/22   [name], MD  fluticasone (FLONASE) 50 MCG/ACT nasal spray Place 2 sprays into both nostrils daily. Patient taking differently: Place 2 sprays into both nostrils daily as needed for allergies. 09/13/19   Wurst, [place], PA-C  fluticasone-salmeterol (ADVAIR HFA) 242-68 MCG/ACT inhaler INHALE 2 PUFFS INTO THE LUNGS TWICE DAILY 09/20/21   [name], MD  gabapentin (NEURONTIN) 300 MG capsule 2 qam and 2qevening 12/25/21   Luking, Elayne Snare, MD  HUMALOG KWIKPEN 200 UNIT/ML KwikPen Inject 30-35 Units into the skin 3 (three) times daily. 10/18/20   [provider]  ipratropium (ATROVENT) 0.06 % nasal spray Place 2 sprays into both nostrils in the morning and at bedtime.    [provider]  Lancets MISC 1 each by Does not apply route 4 (four) times daily. 09/24/19   Cassandria Anger, MD  losartan (COZAAR) 25 MG tablet TAKE ONE (1) TABLET BY MOUTH EVERY DAY 01/22/22   Satira Sark, MD  methocarbamol (ROBAXIN) 500 MG tablet Take 500 mg by mouth every 8 (eight) hours as needed for muscle spasms.    [provider]  nitroGLYCERIN (NITROSTAT) 0.4 MG SL tablet Place 1 tablet (0.4 mg total) under the tongue every 5 (five) minutes as needed. 07/28/21   Satira Sark, MD  Omega-3 Fatty Acids (FISH OIL) 1200 MG CAPS Take 2 capsules (2,400 mg total) by mouth 2 (two) times daily. 06/02/21   [name], MD  omeprazole (PRILOSEC) 40 MG capsule Take 1 capsule (40 mg total) by mouth daily. 04/04/21   [name], MD  ondansetron (ZOFRAN-ODT) 8 MG disintegrating tablet TAKE ONE TABLET (8MG TOTAL) BY  MOUTH EVERY 8 HOURS AS NEEDED FRO NAUSEA OR VOMITING 10/30/21   [name], MD  propranolol (INDERAL) 20 MG tablet Take 1 tablet (20 mg total) by mouth 2 (two) times daily. 02/07/22   Tat, Eustace Quail, DO  ranolazine (RANEXA) 1000 MG SR tablet TAKE ONE TABLET (1000MG TOTAL) BY MOUTH TWO TIMES DAILY 08/23/21   Satira Sark, MD  Semaglutide,0.25 or 0.5MG/DOS, (OZEMPIC, 0.25 OR 0.5 MG/DOSE,) 2 MG/3ML SOPN See admin instructions. 12/15/21   [provider]  sertraline (ZOLOFT) 100 MG tablet Take 1.5  tablets (150 mg total) by mouth daily. 12/04/21   Cloria Spring, MD  sildenafil (VIAGRA) 100 MG tablet Take 0.5-1 tablets (50-100 mg total) by mouth daily as needed for erectile dysfunction. 02/07/22   Luking, Scott A, MD  TOUJEO MAX SOLOSTAR 300 UNIT/ML Solostar Pen INJECT 130 UNITS INTO THE SKIN AT BEDTIME. Patient taking differently: Inject 40-50 Units into the skin at bedtime. 04/11/20   Cassandria Anger, MD  traZODone (DESYREL) 50 MG tablet Take 1 tablet (50 mg total) by mouth at bedtime as needed. 04/28/21   Cloria Spring, MD    Inpatient Medications: Scheduled Meds:  ipratropium-albuterol  3 mL Nebulization Once   Continuous Infusions:  PRN Meds:   Allergies:    Allergies  Allergen Reactions   Doxycycline     Severe esophagitis    Social History:   Social History   Socioeconomic History   Marital status: Married    Spouse name: [name]   Number of children: 1   Years of education: Not on file   Highest education level: Not on file  Occupational History   Not on file  Tobacco Use   Smoking status: Former    Packs/day: 0.50    Years: 10.00    Total pack years: 5.00    Types: Cigarettes    Start date: 02/02/1973    Quit date: 09/17/1983    Years since quitting: 38.4   Smokeless tobacco: Former    Types: Chew    Quit date: 09/17/1983  Vaping Use   Vaping Use: Never used  Substance and Sexual Activity   Alcohol use: No    Alcohol/week: 0.0 standard drinks  of alcohol   Drug use: No   Sexual activity: Yes  Other Topics Concern   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    Married with 1 child-son   Right handed    12 th    Very little caffeine   One story 12 stairs up to door and 8 steps to front door   Social Determinants of Health   Financial Resource Strain: Low Risk  (08/08/2021)   Overall Financial Resource Strain (CARDIA)    Difficulty of Paying Living Expenses: Not hard at all  Food Insecurity: No Food Insecurity (08/08/2021)   Hunger Vital Sign    Worried About Running Out of Food in the Last Year: Never true    Ran Out of Food in the Last Year: Never true  Transportation Needs: No Transportation Needs (08/08/2021)   PRAPARE - Hydrologist (Medical): No    Lack of Transportation (Non-Medical): No  Physical Activity: Insufficiently Active (08/08/2021)   Exercise Vital Sign    Days of Exercise per Week: 3 days    Minutes of Exercise per Session: 30 min  Stress: No Stress Concern Present (08/08/2021)   [place]    Feeling of Stress : Not at all  Social Connections: Page (08/08/2021)   Social Connection and Isolation Panel [NHANES]    Frequency of Communication with Friends and Family: More than three times a week    Frequency of Social Gatherings with Friends and Family: More than three times a week    Attends Religious Services: More than 4 times per year    Active Member of [employer] or Organizations: Yes    Attends Club or  Organization Meetings: More than 4 times per year    Marital Status: Married  Human resources officer Violence: Not At Risk (08/08/2021)   Humiliation, Afraid, Rape, and Kick questionnaire    Fear of Current or Ex-Partner: No    Emotionally Abused: No    Physically Abused: No    Sexually Abused: No    Family  History:    Family History  Problem Relation Age of Onset   Osteoporosis Mother    Cancer Maternal Aunt    CVA Maternal Grandmother    CVA Maternal Grandfather    Healthy Son      ROS:  Please see the history of present illness.  All other ROS reviewed and negative.     Physical Exam/Data:   Vitals:   03/06/22 1153 03/06/22 1154 03/06/22 1330 03/06/22 1530  BP:  138/82 124/86 107/77  Pulse:  84 77 73  Resp:  20 19 (!) 21  Temp:  97.7 F (36.5 C)    TempSrc:  Oral    SpO2:  96% 95% 100%  Weight: 117.9 kg     Height: 6' 2"  (1.88 m)      No intake or output data in the 24 hours ending 03/06/22 1619    03/06/2022   11:53 AM 02/07/2022   11:07 AM 01/24/2022    2:13 PM  Last 3 Weights  Weight (lbs) 260 lb 267 lb 266 lb 12.8 oz  Weight (kg) 117.935 kg 121.11 kg 121.02 kg     Body mass index is 33.38 kg/m.  General:  Laying in bed, NAD HEENT: normal Neck: no JVD Vascular: No carotid bruits; Distal pulses 2+ bilaterally Cardiac:  normal S1, S2; RRR; no murmur  Lungs:  Diminished but clear Abd: soft, nontender, no hepatomegaly  Ext: no edema Musculoskeletal:  No deformities, BUE and BLE strength normal and equal Skin: warm and dry  Neuro:  CNs 2-12 intact, no focal abnormalities noted Psych:  Normal affect   EKG:  The EKG was personally reviewed and demonstrates:  NSR Telemetry:  Telemetry was personally reviewed and demonstrates:  NSR  Relevant CV Studies: Cath 2017: 1. Patent stent mid LAD with no restenosis.  2. Mild to moderate mid LAD stenosis beyond the stent, does not appear to be flow limiting.  3. No obstructive disease in the Circumflex or RCA 4. Normal LV systolic function   Recommendations: Continue medical management of CAD. Agree with starting PPI for possible GERD. Will check D-dimer to exclude PE.   TTE 07/2020: IMPRESSIONS     1. Left ventricular ejection fraction, by estimation, is 60 to 65%. The  left ventricle has normal function. The left  ventricle has no regional  wall motion abnormalities. There is mild left ventricular hypertrophy.  Left ventricular diastolic parameters  were normal.   2. Right ventricular systolic function is normal. The right ventricular  size is normal.   3. Left atrial size was mildly dilated.   4. The mitral valve is normal in structure. Trivial mitral valve  regurgitation. No evidence of mitral stenosis.   5. The aortic valve is normal in structure. Aortic valve regurgitation is  not visualized. No aortic stenosis is present.   6. The inferior vena cava is normal in size with greater than 50%  respiratory variability, suggesting right atrial pressure of 3 mmHg.   Laboratory Data:  High Sensitivity Troponin:   Recent Labs  Lab 03/06/22 1221 03/06/22 1351  TROPONINIHS <2 <2     Chemistry Recent Labs  Lab 03/06/22 1221  NA 137  K 3.9  CL 105  CO2 24  GLUCOSE 174*  BUN 16  CREATININE 0.76  CALCIUM 10.0  GFRNONAA >60  ANIONGAP 8    No results for input(s): "PROT", "ALBUMIN", "AST", "ALT", "ALKPHOS", "BILITOT" in the last 168 hours. Lipids No results for input(s): "CHOL", "TRIG", "HDL", "LABVLDL", "LDLCALC", "CHOLHDL" in the last 168 hours.  Hematology Recent Labs  Lab 03/06/22 1221  WBC 7.6  RBC 5.45  HGB 16.6  HCT 47.8  MCV 87.7  MCH 30.5  MCHC 34.7  RDW 13.3  PLT 171   Thyroid No results for input(s): "TSH", "FREET4" in the last 168 hours.  BNPNo results for input(s): "BNP", "PROBNP" in the last 168 hours.  DDimer No results for input(s): "DDIMER" in the last 168 hours.   Radiology/Studies:  DG Chest 2 View  Result Date: 03/06/2022 CLINICAL DATA:  Chest pain EXAM: CHEST - 2 VIEW COMPARISON:  03/14/2021 FINDINGS: Cardiomegaly. Both lungs are clear. Disc degenerative disease of the thoracic spine. IMPRESSION: Cardiomegaly without acute abnormality of the lungs. Electronically Signed   By: Delanna Ahmadi M.D.   On: 03/06/2022 12:19     Assessment and Plan:   #Chest  Pain: #History of CAD s/p mLAD stent in 2017: Patient presents with chest pressure that has been constant in nature since this past Friday with associated SOB. Symptoms were initially intermittent but then progressed to constant discomfort. Improved some with SL NTG at home. Here, trop negative x2. ECG nonischemic. CXR without acute pathology. While patient has history of CAD with prior LAD stent and is certainly high risk, would expect trop elevation or ECG changes to be present if true ACS given degree of symptoms. It is also atypical that his chest pressure has been constant for over 3 days without ever abating in the setting of normal labs/ECG. Recommend further work-up of chest pain including checking TTE and assessing for possible PE. Can consider ischemic work-up with either cath vs myoview once above completed.  -Check TTE -Agree with work-up for PE given normal trop, no ischemic changes on ECG and ongoing symptoms in ER -Pending above work-up, can consider ischemic testing with either stress vs cath -Continue home ASA 65m daily, lipitor 433mdaily, zetia 1051maily -Continue home ranexa 1000m39mD -Nitro prn -Hold losartan for now given soft pressures in the ER  #HTN: Soft on arrival to the ER.  -Hold home agents for now given soft blood pressures  #HLD: -Continue home lipitor 40mg56mly, zetia 10mg 80my  Risk Assessment/Risk Scores:            For questions or updates, please contact [facility] Please consult [email or web address] for contact info under    Signed, HeatheFreada Bergeron6/20/2023 4:19 PM

## 2022-03-06 NOTE — Assessment & Plan Note (Addendum)
Patient on multiple agents.  Last lipid panel 02/07/22 T chol 172, HDL 38, LDL 51. Lipid Panel     Component Value Date/Time   CHOL 194 02/07/2022 0938   TRIG 372 (H) 02/07/2022 0938   HDL 38 (L) 02/07/2022 0938   CHOLHDL 5.1 (H) 02/07/2022 0938   CHOLHDL 6.4 07/12/2018 0713   VLDL UNABLE TO CALCULATE IF TRIGLYCERIDE OVER 400 mg/dL 07/12/2018 0713   LDLCALC 94 02/07/2022 0938   LABVLDL 62 (H) 02/07/2022 0938    Continue statins

## 2022-03-06 NOTE — ED Notes (Signed)
Admit provider (norins) at the bedside, will transport pt to floor when he has completed his assessment

## 2022-03-06 NOTE — ED Notes (Signed)
Patient transported to CT 

## 2022-03-06 NOTE — Assessment & Plan Note (Addendum)
No report of esophagitis. Continue PPI

## 2022-03-06 NOTE — Assessment & Plan Note (Addendum)
Patient with CAD s/p multiple caths, s/p PCI LAD stent. Last Cath 02/26/18 open LAD stent, EF 55-65%, mild non-obstruction disease ostial vessels. Now with persistent chest pain and SOB/DOE. Troponins <2 x 2, EKG w/o Acute changes. He is at high risk with DM, HLD, HTN prior disease. He has r/o for PE by CTA. Dr. Gwyndolyn Kaufman has consulted on the patient.  - Chest pain improved continue telemetry  Continue home meds  Cardiology to follow: 2D echocardiogram, stress test today 03/07/2022   - Echocardiogram and nuclear stress test:  Echocardiogram EJ EF 60-65%, normal LV function no significant wall motion abnormalities mild LVH grade 1 diastolic dysfunction Right ventricle systolic function normal. Negative valvular abnormalities, mild aortic dilatation  Nuclear stress test:  IMPRESSIONS Negative for stress induced arrhythmias.  Normal left ventricular function. Normal LV size. There is a perfusion defect at apical septum in rest and stress with no wall motion.  This could be consistent with very small infarct from prior LAD disease and prior PCI vs artifact.

## 2022-03-06 NOTE — Assessment & Plan Note (Addendum)
Patient adherent to medical regimen including basal insulin, SGLT2 inhibitor (Farxiga), meal time insulin, metformin.  Last A1C 7.5% 02/07/22.  - Continue home regimen

## 2022-03-06 NOTE — ED Provider Notes (Addendum)
[facility] EMERGENCY DEPARTMENT Provider Note   CSN: [id number] Arrival date & time: 03/06/22  1149     History Chief Complaint  Patient presents with   Chest Pain    Jared Griffin is a 61 y.o. male with history of COPD, CAD status post 1 stent, GERD, hypertension, diabetes, angina presents the emergency department for evaluation of 4 days of left-sided central chest pain.  Patient reports that for the first 2 days it was intermittent in nature, but for the last 2 days it has been more constant.  He reports he feels significant chest pressure.  He also endorses some shortness of breath that is unchanged with exertion.  He reports that he has not found any relief with his inhalers and is felt Kesty to use them more often.  He also reports some dizziness/lightheadedness which she has had before in the past and was chalked up to his anxiety/stress.  He denies any abdominal pain, nausea, vomiting, numbness, tingling, weakness.   Chest Pain Associated symptoms: dizziness and shortness of breath   Associated symptoms: no abdominal pain, no back pain, no cough, no fever, no headache, no nausea, no palpitations, no vomiting and no weakness        Home Medications Prior to Admission medications   Medication Sig Start Date End Date Taking? Authorizing Provider  acetaminophen (TYLENOL) 500 MG tablet Take 1,000 mg by mouth every 8 (eight) hours as needed for moderate pain.    [provider]  albuterol (VENTOLIN HFA) 108 (90 Base) MCG/ACT inhaler INHALE 2 PUFFS INTO LUNGS EVERY 6 HOURS AS NEEDED FOR WHEEZING AND SHORTNESS OF BREATH. Patient taking differently: Inhale 2 puffs into the lungs every 6 (six) hours as needed for shortness of breath or wheezing. INHALE 2 PUFFS INTO LUNGS EVERY 6 HOURS AS NEEDED FOR WHEEZING AND SHORTNESS OF BREATH. 08/10/19   Mikey Kirschner, MD  ALPRAZolam Duanne Moron) 1 MG tablet Take 1 tablet (1 mg total) by mouth 3 (three) times daily as needed for anxiety.  12/04/21 12/04/22  Cloria Spring, MD  aspirin 81 MG EC tablet Take 1 tablet (81 mg total) by mouth daily. 01/22/19   [name], MD  atorvastatin (LIPITOR) 40 MG tablet TAKE ONE TABLET (40MG TOTAL) BY MOUTH DAILY 01/22/22   Satira Sark, MD  B-D UF III MINI PEN NEEDLES 31G X 5 MM MISC USE UP TO 4 TIMES DAILY WITH INSULIN AS DIRECTED. 03/09/21   Cassandria Anger, MD  cetirizine (ZYRTEC) 10 MG tablet Take 1 tablet (10 mg total) by mouth daily. 09/13/19   Wurst, [place], PA-C  colestipol (COLESTID) 1 g tablet Take 2 tablets (2 g total) by mouth daily. Take 4 hours apart from other medication Patient taking differently: Take 2 g by mouth daily. Take 4 hours apart from other medication prn 09/07/21   Montez Morita, Quillian Quince, MD  Continuous Blood Gluc Sensor (FREESTYLE LIBRE 14 DAY SENSOR) MISC Inject 1 each into the skin every 14 (fourteen) days. Use as directed. 12/24/19   Cassandria Anger, MD  dapagliflozin propanediol (FARXIGA) 5 MG TABS tablet Take 5 mg by mouth daily. 03/31/20   [provider]  Doxylamine-Pyridoxine 10-10 MG TBEC Take 1 each by mouth every 12 (twelve) hours as needed (nausea). 09/07/21   [name], MD  ezetimibe (ZETIA) 10 MG tablet Take 1 tablet (10 mg total) by mouth daily. 02/09/22   [name], MD  fluticasone (FLONASE) 50 MCG/ACT nasal spray Place 2  sprays into both nostrils daily. Patient taking differently: Place 2 sprays into both nostrils daily as needed for allergies. 09/13/19   Wurst, [place], PA-C  fluticasone-salmeterol (ADVAIR HFA) 170-01 MCG/ACT inhaler INHALE 2 PUFFS INTO THE LUNGS TWICE DAILY 09/20/21   [name], MD  gabapentin (NEURONTIN) 300 MG capsule 2 qam and 2qevening 12/25/21   Luking, Elayne Snare, MD  HUMALOG KWIKPEN 200 UNIT/ML KwikPen Inject 30-35 Units into the skin 3 (three) times daily. 10/18/20   [provider]  ipratropium (ATROVENT) 0.06 % nasal spray Place 2 sprays into both nostrils in the  morning and at bedtime.    [provider]  Lancets MISC 1 each by Does not apply route 4 (four) times daily. 09/24/19   Cassandria Anger, MD  losartan (COZAAR) 25 MG tablet TAKE ONE (1) TABLET BY MOUTH EVERY DAY 01/22/22   Satira Sark, MD  methocarbamol (ROBAXIN) 500 MG tablet Take 500 mg by mouth every 8 (eight) hours as needed for muscle spasms.    [provider]  nitroGLYCERIN (NITROSTAT) 0.4 MG SL tablet Place 1 tablet (0.4 mg total) under the tongue every 5 (five) minutes as needed. 07/28/21   Satira Sark, MD  Omega-3 Fatty Acids (FISH OIL) 1200 MG CAPS Take 2 capsules (2,400 mg total) by mouth 2 (two) times daily. 06/02/21   [name], MD  omeprazole (PRILOSEC) 40 MG capsule Take 1 capsule (40 mg total) by mouth daily. 04/04/21   [name], MD  ondansetron (ZOFRAN-ODT) 8 MG disintegrating tablet TAKE ONE TABLET (8MG TOTAL) BY MOUTH EVERY 8 HOURS AS NEEDED FRO NAUSEA OR VOMITING 10/30/21   [name], MD  propranolol (INDERAL) 20 MG tablet Take 1 tablet (20 mg total) by mouth 2 (two) times daily. 02/07/22   Tat, Eustace Quail, DO  ranolazine (RANEXA) 1000 MG SR tablet TAKE ONE TABLET (1000MG TOTAL) BY MOUTH TWO TIMES DAILY 08/23/21   Satira Sark, MD  Semaglutide,0.25 or 0.5MG/DOS, (OZEMPIC, 0.25 OR 0.5 MG/DOSE,) 2 MG/3ML SOPN See admin instructions. 12/15/21   [provider]  sertraline (ZOLOFT) 100 MG tablet Take 1.5 tablets (150 mg total) by mouth daily. 12/04/21   Cloria Spring, MD  sildenafil (VIAGRA) 100 MG tablet Take 0.5-1 tablets (50-100 mg total) by mouth daily as needed for erectile dysfunction. 02/07/22   Luking, Scott A, MD  TOUJEO MAX SOLOSTAR 300 UNIT/ML Solostar Pen INJECT 130 UNITS INTO THE SKIN AT BEDTIME. Patient taking differently: Inject 40-50 Units into the skin at bedtime. 04/11/20   Cassandria Anger, MD  traZODone (DESYREL) 50 MG tablet Take 1 tablet (50 mg total) by mouth at bedtime as needed. 04/28/21    Cloria Spring, MD      Allergies    Doxycycline    Review of Systems   Review of Systems  Constitutional:  Negative for chills and fever.  Respiratory:  Positive for shortness of breath. Negative for cough.   Cardiovascular:  Positive for chest pain. Negative for palpitations.  Gastrointestinal:  Negative for abdominal pain, constipation, diarrhea, nausea and vomiting.  Genitourinary:  Negative for dysuria and hematuria.  Musculoskeletal:  Negative for back pain.  Neurological:  Positive for dizziness and light-headedness. Negative for syncope, facial asymmetry, weakness and headaches.    Physical Exam Updated Vital Signs BP 107/77   Pulse 73   Temp 97.7 F (36.5 C) (Oral)   Resp (!) 21   Ht 6' 2"  (1.88 m)   Wt 117.9  kg   SpO2 100%   BMI 33.38 kg/m  Physical Exam Vitals and nursing note reviewed.  Constitutional:      General: He is not in acute distress.    Appearance: Normal appearance. He is not ill-appearing or toxic-appearing.  HENT:     Head: Normocephalic and atraumatic.  Eyes:     General: No scleral icterus.    Extraocular Movements: Extraocular movements intact.     Pupils: Pupils are equal, round, and reactive to light.  Cardiovascular:     Rate and Rhythm: Normal rate and regular rhythm.     Heart sounds: Normal heart sounds.     Comments: Palpable symmetric radial, DP, and PT pulses bilaterally. Pulmonary:     Effort: Pulmonary effort is normal. No respiratory distress.     Breath sounds: Normal breath sounds. No decreased breath sounds.     Comments: Patient clear to auscultation bilaterally.  No respiratory distress, accessory muscle use, nasal flaring, tripoding, or cyanosis present.  Patient is speaking in full sentences with ease and is satting well on room air without any increased work of breathing. Abdominal:     General: Abdomen is flat. Bowel sounds are normal.     Palpations: Abdomen is soft.     Tenderness: There is no abdominal  tenderness. There is no guarding or rebound.  Musculoskeletal:        General: No deformity.     Cervical back: Normal range of motion.  Skin:    General: Skin is warm and dry.  Neurological:     General: No focal deficit present.     Mental Status: He is alert. Mental status is at baseline.     GCS: GCS eye subscore is 4. GCS verbal subscore is 5. GCS motor subscore is 6.     Cranial Nerves: No cranial nerve deficit, dysarthria or facial asymmetry.     Sensory: No sensory deficit.     Motor: No pronator drift.     Coordination: Finger-Nose-Finger Test normal.     Comments: GCS 15.  No facial asymmetry.  Patient answering questions appropriate with appropriate speech.  No pronator drift.  Normal finger-nose. Tremor in bilateral upper extremities not present at rest.      ED Results / Procedures / Treatments   Labs (all labs ordered are listed, but only abnormal results are displayed) Labs Reviewed  BASIC METABOLIC PANEL - Abnormal; Notable for the following components:      Result Value   Glucose, Bld 174 (*)    All other components within normal limits  CBC  D-DIMER, QUANTITATIVE  TROPONIN I (HIGH SENSITIVITY)  TROPONIN I (HIGH SENSITIVITY)  TROPONIN I (HIGH SENSITIVITY)    EKG EKG Interpretation  Date/Time:  Tuesday March 06 2022 11:56:12 EDT Ventricular Rate:  84 PR Interval:  198 QRS Duration: 72 QT Interval:  350 QTC Calculation: 413 R Axis:   14 Text Interpretation: Normal sinus rhythm Low voltage QRS Borderline ECG When compared with ECG of 14-Mar-2021 04:49, PREVIOUS ECG IS PRESENT Confirmed by Noemi Chapel [phone]) on 03/06/2022 12:08:22 PM  Radiology CT Angio Chest/Abd/Pel for Dissection W and/or Wo Contrast  Result Date: 03/06/2022 CLINICAL DATA:  Left-sided chest and shoulder pain x4 days with lightheadedness. EXAM: CT ANGIOGRAPHY CHEST, ABDOMEN AND PELVIS TECHNIQUE: Non-contrast CT of the chest was initially obtained. Multidetector CT imaging through the  chest, abdomen and pelvis was performed using the standard protocol during bolus administration of intravenous contrast. Multiplanar reconstructed images and MIPs were  obtained and reviewed to evaluate the vascular anatomy. RADIATION DOSE REDUCTION: This exam was performed according to the departmental dose-optimization program which includes automated exposure control, adjustment of the mA and/or kV according to patient size and/or use of iterative reconstruction technique. CONTRAST:  177m OMNIPAQUE IOHEXOL 350 MG/ML SOLN COMPARISON:  March 16, 2021 FINDINGS: CTA CHEST FINDINGS Cardiovascular: There is mild calcification of the thoracic aorta, without evidence of aortic aneurysm or dissection. Satisfactory opacification of the pulmonary arteries to the segmental level. No evidence of pulmonary embolism. Normal heart size with marked severity coronary artery calcification. No pericardial effusion. Mediastinum/Nodes: No enlarged mediastinal, hilar, or axillary lymph nodes. Thyroid gland, trachea, and esophagus demonstrate no significant findings. Lungs/Pleura: Mild right upper lobe and posterolateral left lower lobe linear scarring and/or atelectasis is seen. There is no evidence of a pleural effusion or pneumothorax. Musculoskeletal: No chest wall abnormality. No acute or significant osseous findings. Review of the MIP images confirms the above findings. CTA ABDOMEN AND PELVIS FINDINGS VASCULAR Aorta: Moderate severity calcification of a normal caliber aorta without aneurysm, dissection, vasculitis or significant stenosis. Celiac: Patent without evidence of aneurysm, dissection, vasculitis or significant stenosis. SMA: Patent without evidence of aneurysm, dissection, vasculitis or significant stenosis. Renals: Both renal arteries are patent without evidence of aneurysm, dissection, vasculitis, fibromuscular dysplasia or significant stenosis. IMA: Patent without evidence of aneurysm, dissection, vasculitis or  significant stenosis. Inflow: Patent without evidence of aneurysm, dissection, vasculitis or significant stenosis. Veins: No obvious venous abnormality within the limitations of this arterial phase study. Review of the MIP images confirms the above findings. NON-VASCULAR Hepatobiliary: There is diffuse fatty infiltration of the liver parenchyma. No focal liver abnormality is seen. Status post cholecystectomy. No biliary dilatation. Pancreas: Unremarkable. No pancreatic ductal dilatation or surrounding inflammatory changes. Spleen: Normal in size without focal abnormality. Adrenals/Urinary Tract: Adrenal glands are unremarkable. Kidneys are normal in size, without renal calculi or hydronephrosis. Focal scarring is seen along the anterior aspect of the mid to lower left kidney. A 1.3 cm diameter simple cyst is seen within the mid right kidney. The urinary bladder is moderately distended and otherwise unremarkable. Stomach/Bowel: Stomach is within normal limits. Appendix appears normal. No evidence of bowel wall thickening, distention, or inflammatory changes. Lymphatic: No abnormal abdominal or pelvic lymph nodes are identified. Reproductive: Prostate is unremarkable. Other: There is a 3.3 cm x 2.6 cm fat containing right inguinal hernia. A 3.1 cm x 2.4 cm fat containing left inguinal hernia is also noted. Musculoskeletal: No acute or significant osseous findings. Review of the MIP images confirms the above findings. IMPRESSION: 1. No evidence of aortic aneurysm or dissection. 2. No evidence of pulmonary embolism. 3. Marked severity coronary artery calcification. 4. Fatty infiltration of the liver. 5. Bilateral fat containing inguinal hernias. Aortic Atherosclerosis (ICD10-I70.0). Electronically Signed   By: TVirgina NorfolkM.D.   On: 03/06/2022 18:34   DG Chest 2 View  Result Date: 03/06/2022 CLINICAL DATA:  Chest pain EXAM: CHEST - 2 VIEW COMPARISON:  03/14/2021 FINDINGS: Cardiomegaly. Both lungs are clear.  Disc degenerative disease of the thoracic spine. IMPRESSION: Cardiomegaly without acute abnormality of the lungs. Electronically Signed   By: ADelanna AhmadiM.D.   On: 03/06/2022 12:19    Procedures Procedures   Medications Ordered in ED Medications  ipratropium-albuterol (DUONEB) 0.5-2.5 (3) MG/3ML nebulizer solution 3 mL (has no administration in time range)  nitroGLYCERIN (NITROGLYN) 2 % ointment 0.5 inch (has no administration in time range)    ED Course/ Medical Decision  Making/ A&P                           Medical Decision Making Amount and/or Complexity of Data Reviewed Labs: ordered. Radiology: ordered.  Risk Prescription drug management.    61 year old male presents emerged department for evaluation of chest pain and shortness of breath for the past 3 to 4 days.  Differential diagnosis includes was not limited to ACS, PE, COPD exacerbation, pneumonia, pneumothorax, dissection, aneurysm, angina.  Vital signs are unremarkable.  Patient normotensive, afebrile, normal pulse rate, satting well room air without increased work of breathing.  Physical exam is overall reassuring.  Patient well-appearing in no acute distress.  He has regular rate and rhythm.  Palpable pulses.  Lungs are clear to auscultation bilaterally with no increased effort.  Abdomen is soft and nontender.  He has a benign neurological exam.  We will order labs initially.  I independently reviewed and interpreted the patient's labs.  CBC without leukocytosis or anemia.  BMP shows slightly elevated glucose at 134 although patient known diabetic.  Normal creatinine.  No other electrolyte abnormalities.  Troponins consistently negative at less than 2 with repeat x3.  Less than 0.27 D-dimer as well.  On reevaluation, patient reports the crushing chest pain again and reports that it feels gets worsening and going into his left arm.  Concern for given the patient any nitroglycerin given his borderline blood pressures.   Attending physician recommended Nitropaste.  Given his persistent chest pain as well as unremarkable vital signs, did place a consult for cardiology.  I spoke with Dr. Johney Frame who evaluated the patient at bedside.  We discussed PE versus possible dissection although the patient has stable vital signs.  Will order D-dimer and send results of that before ordering any CT imaging with contrast.  Dimer was negative, will order CT angio of chest abdomen pelvis to rule out any dissection. Chest x-ray shows cardiomegaly without acute abnormality of the lungs.  CT angio of the chest abdomen pelvis shows no evidence of any aortic aneurysm or dissection, no evidence of any pulmonary embolism, marked severity coronary artery calcification, fatty infiltration of the liver, and bilateral fat-containing inguinal hernias.  Patient's dizziness/lightheadedness could be cardiogenic related.  Patient has a benign neurological exam other than some tremors in his upper extremities which been present for months to years.  Can be further evaluated during admission stay.  Regardless, patient will be admitted to medicine for further chest pain work-up per cardiologist.  Dr. Johney Frame reports that she will likely do an echo with possible cath plus or minus.  Admission paged. Admit to Dr. Linda Hedges.  I discussed this case with my attending physician who cosigned this note including patient's presenting symptoms, physical exam, and planned diagnostics and interventions. Attending physician stated agreement with plan or made changes to plan which were implemented.   Final Clinical Impression(s) / ED Diagnoses Final diagnoses:  Atypical chest pain    Rx / DC Orders ED Discharge Orders     None         Sherrell Puller, PA-C 03/06/22 1916    Sherrell Puller, PA-C 03/06/22 1924    Fredia Sorrow, MD 03/17/22 2325

## 2022-03-06 NOTE — Assessment & Plan Note (Addendum)
-  Resolved -Known CAD with multiple caths. Last saw Dr. [name] in May and was stable. Risk modification in place with good control of HLD, Last A1C 02/07/22 7.5%   -ACS was ruled out, status post 2D echocardiogram, nuclear stress test -Cleared by cardiology to be discharged home, no further recommendation or change in current medication

## 2022-03-06 NOTE — ED Triage Notes (Signed)
Pt presents with chest pain x 3-4 days. Pt describes it as a L sided chest pressure. Pt states he did take 1 NTG on Sunday with some relief. Pt reports associated ShOB.

## 2022-03-07 ENCOUNTER — Observation Stay (HOSPITAL_BASED_OUTPATIENT_CLINIC_OR_DEPARTMENT_OTHER): Payer: Medicare Other

## 2022-03-07 ENCOUNTER — Encounter (HOSPITAL_COMMUNITY): Payer: Self-pay | Admitting: Internal Medicine

## 2022-03-07 DIAGNOSIS — R0789 Other chest pain: Secondary | ICD-10-CM | POA: Diagnosis not present

## 2022-03-07 DIAGNOSIS — R079 Chest pain, unspecified: Secondary | ICD-10-CM

## 2022-03-07 LAB — NM MYOCAR MULTI W/SPECT W/WALL MOTION / EF
LV dias vol: 96 mL (ref 62–150)
LV sys vol: 31 mL
Nuc Stress EF: 68 %
Peak HR: 99 {beats}/min
RATE: 0.3
Rest HR: 69 {beats}/min
Rest Nuclear Isotope Dose: 11 mCi
SDS: 1
SRS: 2
SSS: 3
ST Depression (mm): 0 mm
Stress Nuclear Isotope Dose: 33 mCi
TID: 1.16

## 2022-03-07 LAB — ECHOCARDIOGRAM COMPLETE
AR max vel: 3.62 cm2
AV Area VTI: 3.85 cm2
AV Area mean vel: 3.61 cm2
AV Mean grad: 2 mmHg
AV Peak grad: 4.8 mmHg
Ao pk vel: 1.1 m/s
Area-P 1/2: 3.37 cm2
Calc EF: 66.6 %
Height: 74 in
MV VTI: 3.37 cm2
S' Lateral: 3.3 cm
Single Plane A2C EF: 62 %
Single Plane A4C EF: 70.2 %
Weight: 4208.14 oz

## 2022-03-07 LAB — GLUCOSE, CAPILLARY
Glucose-Capillary: 192 mg/dL — ABNORMAL HIGH (ref 70–99)
Glucose-Capillary: 199 mg/dL — ABNORMAL HIGH (ref 70–99)
Glucose-Capillary: 219 mg/dL — ABNORMAL HIGH (ref 70–99)
Glucose-Capillary: 261 mg/dL — ABNORMAL HIGH (ref 70–99)

## 2022-03-07 LAB — HIV ANTIBODY (ROUTINE TESTING W REFLEX): HIV Screen 4th Generation wRfx: NONREACTIVE

## 2022-03-07 MED ORDER — TECHNETIUM TC 99M TETROFOSMIN IV KIT
30.0000 | PACK | Freq: Once | INTRAVENOUS | Status: AC | PRN
  Administered 2022-03-07: 33 via INTRAVENOUS

## 2022-03-07 MED ORDER — REGADENOSON 0.4 MG/5ML IV SOLN
INTRAVENOUS | Status: AC
  Administered 2022-03-07: 0.4 mg via INTRAVENOUS
  Filled 2022-03-07: qty 5

## 2022-03-07 MED ORDER — HYDROMORPHONE HCL 1 MG/ML IJ SOLN
1.0000 mg | Freq: Once | INTRAMUSCULAR | Status: AC
  Administered 2022-03-07: 1 mg via INTRAVENOUS
  Filled 2022-03-07: qty 1

## 2022-03-07 MED ORDER — ATORVASTATIN CALCIUM 40 MG PO TABS
80.0000 mg | ORAL_TABLET | Freq: Every day | ORAL | Status: DC
  Administered 2022-03-07 – 2022-03-08 (×2): 80 mg via ORAL
  Filled 2022-03-07 (×2): qty 2

## 2022-03-07 MED ORDER — INSULIN ASPART 100 UNIT/ML IJ SOLN
0.0000 [IU] | Freq: Three times a day (TID) | INTRAMUSCULAR | Status: DC
  Administered 2022-03-07 – 2022-03-08 (×2): 3 [IU] via SUBCUTANEOUS

## 2022-03-07 MED ORDER — TECHNETIUM TC 99M TETROFOSMIN IV KIT
10.0000 | PACK | Freq: Once | INTRAVENOUS | Status: AC | PRN
  Administered 2022-03-07: 11 via INTRAVENOUS

## 2022-03-07 MED ORDER — INSULIN ASPART 100 UNIT/ML IJ SOLN
0.0000 [IU] | Freq: Every day | INTRAMUSCULAR | Status: DC
  Administered 2022-03-07: 3 [IU] via SUBCUTANEOUS

## 2022-03-07 MED ORDER — SODIUM CHLORIDE FLUSH 0.9 % IV SOLN
INTRAVENOUS | Status: AC
  Administered 2022-03-07: 10 mL via INTRAVENOUS
  Filled 2022-03-07: qty 10

## 2022-03-07 MED ORDER — LOSARTAN POTASSIUM 50 MG PO TABS
25.0000 mg | ORAL_TABLET | Freq: Every day | ORAL | Status: DC
Start: 2022-03-08 — End: 2022-03-07

## 2022-03-07 MED ORDER — PROCHLORPERAZINE EDISYLATE 10 MG/2ML IJ SOLN
10.0000 mg | Freq: Once | INTRAMUSCULAR | Status: AC
  Administered 2022-03-07: 10 mg via INTRAVENOUS
  Filled 2022-03-07: qty 2

## 2022-03-07 NOTE — Hospital Course (Signed)
CORRION STIREWALT is a 61 y.o. male with a hx of CAD with history of mLAD PCI in 11/2013, HTN, HLD, DMII, COPD, anxiety and depression who follows with Dr. [name]. Last cath in 2017 showed mild-to-moderate mLAD stenosis beyond the stent with no significant in-stent restenosis. Last TTE 07/2020 with LVEF 60-65%, no WMA, normal RV, no significant valve disease. Was last seen in clinic by Dr. [name] on 01/2022 where he was doing well from a CV.    Patient reports that he has had persistent chest pain and SOB since Friday along with increased fatigue. Symptoms were intermittent but became constant. SLG NTG relieved symptoms. Due to persistent symptoms he presents to [facility] for evaluation.    ED Course: afebrile  106/81. Patient's chest pain continued but lessened. Lab: Cmet remarkable for glucose 174, Mg 1.4, CBCD nl, D-dimer <0.27. Troponins <2 x 2. EKG without acute changes. Exam was unremarkable. Dr. Johney Frame consulted: recommended r/o PE. Asked that [place] admit and arrange for TTE as well as close monitoring.

## 2022-03-07 NOTE — Progress Notes (Signed)
Patient off floor for stress test.

## 2022-03-07 NOTE — Progress Notes (Signed)
   Patient's Lexiscan Myoview showed no evidence of ischemia and was a low-risk study. No plans for further cardiac testing at this time. Admitting team made aware.  Signed, Erma Heritage, PA-C 03/07/2022, 4:34 PM Pager: [phone]

## 2022-03-07 NOTE — Progress Notes (Signed)
PROGRESS NOTE    Patient: Jared Griffin                            PCP: [name], MD                    DOB: October 10, 1960            DOA: 03/06/2022 EXN:[id number]             DOS: 03/07/2022, 1:18 PM   LOS: 0 days   Date of Service: The patient was seen and examined on 03/07/2022  Subjective:   The patient was seen and examined this morning. Stable at this time. Stable denies any shortness with chest pain this morning Otherwise no issues overnight .  Brief Narrative:   Jared Griffin is a 61 y.o. male with a hx of CAD with history of mLAD PCI in 11/2013, HTN, HLD, DMII, COPD, anxiety and depression who follows with Dr. [name]. Last cath in 2017 showed mild-to-moderate mLAD stenosis beyond the stent with no significant in-stent restenosis. Last TTE 07/2020 with LVEF 60-65%, no WMA, normal RV, no significant valve disease. Was last seen in clinic by Dr. [name] on 01/2022 where he was doing well from a CV.    Patient reports that he has had persistent chest pain and SOB since Friday along with increased fatigue. Symptoms were intermittent but became constant. SLG NTG relieved symptoms. Due to persistent symptoms he presents to [facility] for evaluation.    ED Course: afebrile  106/81. Patient's chest pain continued but lessened. Lab: Cmet remarkable for glucose 174, Mg 1.4, CBCD nl, D-dimer <0.27. Troponins <2 x 2. EKG without acute changes. Exam was unremarkable. Dr. Johney Frame consulted: recommended r/o PE. Asked that [place] admit and arrange for TTE as well as close monitoring.       Assessment & Plan:   Principal Problem:   Atypical chest pain Active Problems:   CAD S/P LAD DES March 2015   DM2 (diabetes mellitus, type 2) (HCC)   Essential hypertension, benign   Esophageal reflux   NASH (nonalcoholic steatohepatitis)   Mixed hyperlipidemia     Assessment and Plan: * Atypical chest pain Patient with CAD s/p multiple caths, s/p PCI LAD stent. Last Cath 02/26/18 open LAD  stent, EF 55-65%, mild non-obstruction disease ostial vessels. Now with persistent chest pain and SOB/DOE. Troponins <2 x 2, EKG w/o Acute changes. He is at high risk with DM, HLD, HTN prior disease. He has r/o for PE by CTA. Dr. Gwyndolyn Kaufman has consulted on the patient.  - Chest pain improved continue telemetry  Continue home meds  Cardiology to follow: Planning for 2D echocardiogram, stress test today 03/07/2022   -???  possibly for R/L heart cath  CAD S/P LAD DES March 2015 Known CAD with multiple caths. Last saw Dr. [name] in May and was stable. Risk modification in place with good control of HLD, Last A1C 02/07/22 7.5%  -Ruling out acute ACS, pending echo, cardiology planning for cardiac cath -Continue risk modification   NASH (nonalcoholic steatohepatitis) Labs are stable.  Esophageal reflux No report of esophagitis.  Continue PPI    Essential hypertension, benign BP generally well controlled. BP 106/81 at [facility] home meds  DM2 (diabetes mellitus, type 2) ([place]) Patient adherent to medical regimen including basal insulin, SGLT2 inhibitor (Farxiga), meal time insulin, metformin. Last A1C 7.5% 02/07/22.  - Continue home regimen except  meal time insulin, instead SS coverage.   Mixed hyperlipidemia Patient on multiple agents. Last lipid panel 02/07/22 T chol 172, HDL 38, LDL 51.  Continue statins            ----------------------------------------------------------------------------------------------------------------------------------------------- Nutritional status:  The patient's BMI is: Body mass index is 33.77 kg/m. I agree with the assessment and plan as outlined ---------------------------------------------------------------------------------------------------------------------------------------------  DVT prophylaxis:  enoxaparin (LOVENOX) injection 40 mg Start: 03/06/22 2100   Code Status:   Code Status: Full Code  Family  Communication: No family member present at bedside- attempt will be made to update daily The above findings and plan of care has been discussed with patient (and family)  in detail,  they expressed understanding and agreement of above. -Advance care planning has been discussed.   Admission status:   Status is: Observation The patient remains OBS appropriate and will d/c before 2 midnights.     Procedures:   No admission procedures for hospital encounter.   Antimicrobials:  Anti-infectives (From admission, onward)    None        Medication:   aspirin EC  81 mg Oral Daily   atorvastatin  80 mg Oral Daily   colestipol  2 g Oral Daily   dapagliflozin propanediol  5 mg Oral Daily   enoxaparin (LOVENOX) injection  40 mg Subcutaneous Q24H   ezetimibe  10 mg Oral Daily   gabapentin  600 mg Oral BID   insulin aspart  0-5 Units Subcutaneous QHS   insulin aspart  0-9 Units Subcutaneous TID WC   insulin glargine-yfgn  40 Units Subcutaneous QHS   loratadine  10 mg Oral Daily   magnesium oxide  400 mg Oral Daily   mometasone-formoterol  2 puff Inhalation BID   pantoprazole  80 mg Oral Daily   propranolol  20 mg Oral Daily   ranolazine  1,000 mg Oral BID   sertraline  150 mg Oral Daily    acetaminophen, albuterol, ALPRAZolam, doxylamine (Sleep) **AND** vitamin B-6, fluticasone, methocarbamol, nitroGLYCERIN, ondansetron (ZOFRAN) IV, ondansetron, traZODone   Objective:   Vitals:   03/07/22 0130 03/07/22 0525 03/07/22 0752 03/07/22 1050  BP: 124/85 103/60  121/70  Pulse: 86 75  89  Resp: 18 17  20   Temp: 98.5 F (36.9 C) 98.8 F (37.1 C)    TempSrc: Oral     SpO2: 94% 95% 96% 93%  Weight:      Height:        Intake/Output Summary (Last 24 hours) at 03/07/2022 1318 Last data filed at 03/07/2022 0500 Gross per 24 hour  Intake 294.97 ml  Output --  Net 294.97 ml   Filed Weights   03/06/22 1153 03/06/22 2050  Weight: 117.9 kg 119.3 kg     Examination:    Physical Exam  Constitution:  Alert, cooperative, no distress,  Appears calm and comfortable  Psychiatric:   Normal and stable mood and affect, cognition intact,   HEENT:        Normocephalic, PERRL, otherwise with in Normal limits  Chest:         Chest symmetric Cardio vascular:  S1/S2, RRR, No murmure, No Rubs or Gallops  pulmonary: Clear to auscultation bilaterally, respirations unlabored, negative wheezes / crackles Abdomen: Soft, non-tender, non-distended, bowel sounds,no masses, no organomegaly Muscular skeletal: Limited exam - in bed, able to move all 4 extremities,   Neuro: CNII-XII intact. , normal motor and sensation, reflexes intact  Extremities: No pitting edema lower extremities, +2 pulses  Skin: Dry,  warm to touch, negative for any Rashes, No open wounds Wounds: per nursing documentation   ------------------------------------------------------------------------------------------------------------------------------------------    LABs:     Latest Ref Rng & Units 03/06/2022   12:21 PM 12/06/2021   10:34 AM 08/15/2021    8:06 AM  CBC  WBC 4.0 - 10.5 K/uL 7.6  6.8  7.7   Hemoglobin 13.0 - 17.0 g/dL 16.6  16.8  15.6   Hematocrit 39.0 - 52.0 % 47.8  50.1  45.9   Platelets 150 - 400 K/uL 171  150  178       Latest Ref Rng & Units 03/06/2022   12:21 PM 02/07/2022    9:38 AM 06/20/2021    8:39 AM  CMP  Glucose 70 - 99 mg/dL 174  221  241   BUN 6 - 20 mg/dL 16  15  14    Creatinine 0.61 - 1.24 mg/dL 0.76  1.07  0.91   Sodium 135 - 145 mmol/L 137  140  135   Potassium 3.5 - 5.1 mmol/L 3.9  4.3  3.9   Chloride 98 - 111 mmol/L 105  101  101   CO2 22 - 32 mmol/L 24  21  24    Calcium 8.9 - 10.3 mg/dL 10.0  9.4  9.4   Total Protein 6.0 - 8.5 g/dL  7.3  7.3   Total Bilirubin 0.0 - 1.2 mg/dL  0.9  0.8   Alkaline Phos 44 - 121 IU/L  62  46   AST 0 - 40 IU/L  36  45   ALT 0 - 44 IU/L  59  49        Micro Results No results found for this or any previous visit (from the  past 240 hour(s)).  Radiology Reports ECHOCARDIOGRAM COMPLETE  Result Date: 03/07/2022    ECHOCARDIOGRAM REPORT   Patient Name:   Jared Griffin Date of Exam: 03/07/2022 Medical Rec #:  [id number]        Height:       74.0 in Accession #:    [id number]       Weight:       263.0 lb Date of Birth:  1960/12/03        BSA:          2.442 m Patient Age:    44 years         BP:           103/60 mmHg Patient Gender: M                HR:           66 bpm. Exam Location:  [name] Procedure: 2D Echo, Cardiac Doppler and Color Doppler Indications:    Chest Pain  History:        Patient has prior history of Echocardiogram examinations, most                 recent 08/09/2020. CAD, Signs/Symptoms:Chest Pain and                 Dizziness/Lightheadedness; Risk Factors:Hypertension, Diabetes                 and Dyslipidemia.  Sonographer:    Wenda Low Referring Phys: [place]  1. Left ventricular ejection fraction, by estimation, is 60 to 65%. The left ventricle has normal function. The left ventricle has no regional wall motion abnormalities. There is mild concentric left ventricular hypertrophy. Left ventricular  diastolic parameters are consistent with Grade I diastolic dysfunction (impaired relaxation).  2. Right ventricular systolic function is normal. The right ventricular size is normal. There is normal pulmonary artery systolic pressure. The estimated right ventricular systolic pressure is 32.2 mmHg.  3. The mitral valve is normal in structure. No evidence of mitral valve regurgitation. No evidence of mitral stenosis.  4. The aortic valve is tricuspid. Aortic valve regurgitation is not visualized. No aortic stenosis is present.  5. Aortic dilatation noted. There is mild dilatation of the aortic root, measuring 42 mm. There is mild dilatation of the ascending aorta, measuring 41 mm. Comparison(s): Aorta is mildly dilated; recommened one year repeat imaging if clinically indicated.  FINDINGS  Left Ventricle: Left ventricular ejection fraction, by estimation, is 60 to 65%. The left ventricle has normal function. The left ventricle has no regional wall motion abnormalities. The left ventricular internal cavity size was normal in size. There is  mild concentric left ventricular hypertrophy. Left ventricular diastolic parameters are consistent with Grade I diastolic dysfunction (impaired relaxation). Right Ventricle: The right ventricular size is normal. No increase in right ventricular wall thickness. Right ventricular systolic function is normal. There is normal pulmonary artery systolic pressure. The tricuspid regurgitant velocity is 2.22 m/s, and  with an assumed right atrial pressure of 3 mmHg, the estimated right ventricular systolic pressure is 02.5 mmHg. Left Atrium: Left atrial size was normal in size. Right Atrium: Right atrial size was normal in size. Pericardium: Trivial pericardial effusion is present. Presence of epicardial fat layer. Mitral Valve: The mitral valve is normal in structure. No evidence of mitral valve regurgitation. No evidence of mitral valve stenosis. MV peak gradient, 3.2 mmHg. The mean mitral valve gradient is 1.0 mmHg. Tricuspid Valve: The tricuspid valve is normal in structure. Tricuspid valve regurgitation is not demonstrated. No evidence of tricuspid stenosis. Aortic Valve: The aortic valve is tricuspid. Aortic valve regurgitation is not visualized. No aortic stenosis is present. Aortic valve mean gradient measures 2.0 mmHg. Aortic valve peak gradient measures 4.8 mmHg. Aortic valve area, by VTI measures 3.85 cm. Pulmonic Valve: The pulmonic valve was normal in structure. Pulmonic valve regurgitation is not visualized. No evidence of pulmonic stenosis. Aorta: Aortic dilatation noted. There is mild dilatation of the aortic root, measuring 42 mm. There is mild dilatation of the ascending aorta, measuring 41 mm. IAS/Shunts: No atrial level shunt detected by color  flow Doppler.  LEFT VENTRICLE PLAX 2D LVIDd:         4.90 cm     Diastology LVIDs:         3.30 cm     LV e' medial:    6.96 cm/s LV PW:         1.30 cm     LV E/e' medial:  9.8 LV IVS:        1.30 cm     LV e' lateral:   8.49 cm/s LVOT diam:     2.40 cm     LV E/e' lateral: 8.0 LV SV:         90 LV SV Index:   37 LVOT Area:     4.52 cm  LV Volumes (MOD) LV vol d, MOD A2C: 72.6 ml LV vol d, MOD A4C: 72.8 ml LV vol s, MOD A2C: 27.6 ml LV vol s, MOD A4C: 21.7 ml LV SV MOD A2C:     45.0 ml LV SV MOD A4C:     72.8 ml LV SV MOD BP:  48.7 ml RIGHT VENTRICLE RV Basal diam:  3.50 cm RV Mid diam:    3.30 cm RV S prime:     13.60 cm/s TAPSE (M-mode): 2.9 cm LEFT ATRIUM           Index        RIGHT ATRIUM           Index LA diam:      3.80 cm 1.56 cm/m   RA Area:     21.10 cm LA Vol (A2C): 47.0 ml 19.25 ml/m  RA Volume:   60.20 ml  24.65 ml/m LA Vol (A4C): 60.2 ml 24.65 ml/m  AORTIC VALVE                    PULMONIC VALVE AV Area (Vmax):    3.62 cm     PV Vmax:       0.73 m/s AV Area (Vmean):   3.61 cm     PV Peak grad:  2.1 mmHg AV Area (VTI):     3.85 cm AV Vmax:           110.00 cm/s AV Vmean:          71.400 cm/s AV VTI:            0.235 m AV Peak Grad:      4.8 mmHg AV Mean Grad:      2.0 mmHg LVOT Vmax:         88.10 cm/s LVOT Vmean:        56.900 cm/s LVOT VTI:          0.200 m LVOT/AV VTI ratio: 0.85  AORTA Ao Root diam: 4.30 cm Ao Asc diam:  4.00 cm MITRAL VALVE               TRICUSPID VALVE MV Area (PHT): 3.37 cm    TR Peak grad:   19.7 mmHg MV Area VTI:   3.37 cm    TR Vmax:        222.00 cm/s MV Peak grad:  3.2 mmHg MV Mean grad:  1.0 mmHg    SHUNTS MV Vmax:       0.89 m/s    Systemic VTI:  0.20 m MV Vmean:      51.2 cm/s   Systemic Diam: 2.40 cm MV Decel Time: 225 msec MV E velocity: 68.10 cm/s MV A velocity: 71.60 cm/s MV E/A ratio:  0.95 Rudean Haskell MD Electronically signed by Rudean Haskell MD Signature Date/Time: 03/07/2022/11:12:30 AM    Final    CT Angio Chest/Abd/Pel for  Dissection W and/or Wo Contrast  Result Date: 03/06/2022 CLINICAL DATA:  Left-sided chest and shoulder pain x4 days with lightheadedness. EXAM: CT ANGIOGRAPHY CHEST, ABDOMEN AND PELVIS TECHNIQUE: Non-contrast CT of the chest was initially obtained. Multidetector CT imaging through the chest, abdomen and pelvis was performed using the standard protocol during bolus administration of intravenous contrast. Multiplanar reconstructed images and MIPs were obtained and reviewed to evaluate the vascular anatomy. RADIATION DOSE REDUCTION: This exam was performed according to the departmental dose-optimization program which includes automated exposure control, adjustment of the mA and/or kV according to patient size and/or use of iterative reconstruction technique. CONTRAST:  197m OMNIPAQUE IOHEXOL 350 MG/ML SOLN COMPARISON:  March 16, 2021 FINDINGS: CTA CHEST FINDINGS Cardiovascular: There is mild calcification of the thoracic aorta, without evidence of aortic aneurysm or dissection. Satisfactory opacification of the pulmonary arteries to the segmental level. No evidence of pulmonary embolism. Normal heart  size with marked severity coronary artery calcification. No pericardial effusion. Mediastinum/Nodes: No enlarged mediastinal, hilar, or axillary lymph nodes. Thyroid gland, trachea, and esophagus demonstrate no significant findings. Lungs/Pleura: Mild right upper lobe and posterolateral left lower lobe linear scarring and/or atelectasis is seen. There is no evidence of a pleural effusion or pneumothorax. Musculoskeletal: No chest wall abnormality. No acute or significant osseous findings. Review of the MIP images confirms the above findings. CTA ABDOMEN AND PELVIS FINDINGS VASCULAR Aorta: Moderate severity calcification of a normal caliber aorta without aneurysm, dissection, vasculitis or significant stenosis. Celiac: Patent without evidence of aneurysm, dissection, vasculitis or significant stenosis. SMA: Patent without  evidence of aneurysm, dissection, vasculitis or significant stenosis. Renals: Both renal arteries are patent without evidence of aneurysm, dissection, vasculitis, fibromuscular dysplasia or significant stenosis. IMA: Patent without evidence of aneurysm, dissection, vasculitis or significant stenosis. Inflow: Patent without evidence of aneurysm, dissection, vasculitis or significant stenosis. Veins: No obvious venous abnormality within the limitations of this arterial phase study. Review of the MIP images confirms the above findings. NON-VASCULAR Hepatobiliary: There is diffuse fatty infiltration of the liver parenchyma. No focal liver abnormality is seen. Status post cholecystectomy. No biliary dilatation. Pancreas: Unremarkable. No pancreatic ductal dilatation or surrounding inflammatory changes. Spleen: Normal in size without focal abnormality. Adrenals/Urinary Tract: Adrenal glands are unremarkable. Kidneys are normal in size, without renal calculi or hydronephrosis. Focal scarring is seen along the anterior aspect of the mid to lower left kidney. A 1.3 cm diameter simple cyst is seen within the mid right kidney. The urinary bladder is moderately distended and otherwise unremarkable. Stomach/Bowel: Stomach is within normal limits. Appendix appears normal. No evidence of bowel wall thickening, distention, or inflammatory changes. Lymphatic: No abnormal abdominal or pelvic lymph nodes are identified. Reproductive: Prostate is unremarkable. Other: There is a 3.3 cm x 2.6 cm fat containing right inguinal hernia. A 3.1 cm x 2.4 cm fat containing left inguinal hernia is also noted. Musculoskeletal: No acute or significant osseous findings. Review of the MIP images confirms the above findings. IMPRESSION: 1. No evidence of aortic aneurysm or dissection. 2. No evidence of pulmonary embolism. 3. Marked severity coronary artery calcification. 4. Fatty infiltration of the liver. 5. Bilateral fat containing inguinal hernias.  Aortic Atherosclerosis (ICD10-I70.0). Electronically Signed   By: Virgina Norfolk M.D.   On: 03/06/2022 18:34    SIGNED: Deatra Khaled, MD, FHM. [facility],  Pager (please use [email or web address] to page/text) Please use Epic Secure Chat for non-urgent communication (7AM-7PM)  If 7PM-7AM, please contact night-coverage [email or web address], 03/07/2022, 1:18 PM

## 2022-03-07 NOTE — Care Management Obs Status (Signed)
MEDICARE OBSERVATION STATUS NOTIFICATION   Patient Details  Name: Jared Griffin MRN: [id number] Date of Birth: Mar 30, 1961   Medicare Observation Status Notification Given:  Yes    Tommy Medal 03/07/2022, 6:02 PM

## 2022-03-07 NOTE — Plan of Care (Signed)
  Problem: Education: Goal: Knowledge of General Education information will improve Description: Including pain rating scale, medication(s)/side effects and non-pharmacologic comfort measures Outcome: Progressing   Problem: Health Behavior/Discharge Planning: Goal: Ability to manage health-related needs will improve Outcome: Progressing   Problem: Clinical Measurements: Goal: Ability to maintain clinical measurements within normal limits will improve Outcome: Progressing Goal: Will remain free from infection Outcome: Progressing Goal: Diagnostic test results will improve Outcome: Progressing Goal: Respiratory complications will improve Outcome: Progressing Goal: Cardiovascular complication will be avoided Outcome: Progressing   Problem: Activity: Goal: Risk for activity intolerance will decrease Outcome: Progressing   Problem: Nutrition: Goal: Adequate nutrition will be maintained Outcome: Progressing   Problem: Coping: Goal: Level of anxiety will decrease Outcome: Progressing   Problem: Elimination: Goal: Will not experience complications related to bowel motility Outcome: Progressing Goal: Will not experience complications related to urinary retention Outcome: Progressing   Problem: Pain Managment: Goal: General experience of comfort will improve Outcome: Progressing   Problem: Safety: Goal: Ability to remain free from injury will improve Outcome: Progressing   Problem: Skin Integrity: Goal: Risk for impaired skin integrity will decrease Outcome: Progressing   Problem: Education: Goal: Understanding of cardiac disease, CV risk reduction, and recovery process will improve Outcome: Progressing Goal: Individualized Educational Video(s) Outcome: Progressing   Problem: Activity: Goal: Ability to tolerate increased activity will improve Outcome: Progressing   Problem: Cardiac: Goal: Ability to achieve and maintain adequate cardiovascular perfusion will  improve Outcome: Progressing   Problem: Health Behavior/Discharge Planning: Goal: Ability to safely manage health-related needs after discharge will improve Outcome: Progressing   Problem: Education: Goal: Ability to describe self-care measures that may prevent or decrease complications (Diabetes Survival Skills Education) will improve Outcome: Progressing Goal: Individualized Educational Video(s) Outcome: Progressing   Problem: Coping: Goal: Ability to adjust to condition or change in health will improve Outcome: Progressing   Problem: Fluid Volume: Goal: Ability to maintain a balanced intake and output will improve Outcome: Progressing   Problem: Health Behavior/Discharge Planning: Goal: Ability to identify and utilize available resources and services will improve Outcome: Progressing Goal: Ability to manage health-related needs will improve Outcome: Progressing   Problem: Metabolic: Goal: Ability to maintain appropriate glucose levels will improve Outcome: Progressing   Problem: Nutritional: Goal: Maintenance of adequate nutrition will improve Outcome: Progressing Goal: Progress toward achieving an optimal weight will improve Outcome: Progressing   Problem: Skin Integrity: Goal: Risk for impaired skin integrity will decrease Outcome: Progressing   Problem: Tissue Perfusion: Goal: Adequacy of tissue perfusion will improve Outcome: Progressing

## 2022-03-07 NOTE — Progress Notes (Addendum)
Progress Note  Patient Name: Jared Griffin Date of Encounter: 03/07/2022  [facility] Cardiologist: Rozann Lesches, MD   Subjective   Reports his chest pain has improved but having a migraine headache this morning. In tears due to his current pain (receiving Compazine and Dilaudid currently). NTG patch has been removed. Says his chest pain has improved from yesterday.   Inpatient Medications    Scheduled Meds:  aspirin EC  81 mg Oral Daily   atorvastatin  40 mg Oral Daily   colestipol  2 g Oral Daily   dapagliflozin propanediol  5 mg Oral Daily   enoxaparin (LOVENOX) injection  40 mg Subcutaneous Q24H   ezetimibe  10 mg Oral Daily   gabapentin  600 mg Oral BID   insulin glargine-yfgn  40 Units Subcutaneous QHS   loratadine  10 mg Oral Daily   losartan  25 mg Oral Daily   magnesium oxide  400 mg Oral Daily   metFORMIN  1,000 mg Oral BID WC   mometasone-formoterol  2 puff Inhalation BID   pantoprazole  80 mg Oral Daily   propranolol  20 mg Oral Daily   ranolazine  1,000 mg Oral BID   sertraline  150 mg Oral Daily   Continuous Infusions:  sodium chloride 10 mL/hr at 03/07/22 0320   PRN Meds: acetaminophen, albuterol, ALPRAZolam, doxylamine (Sleep) **AND** vitamin B-6, fluticasone, methocarbamol, nitroGLYCERIN, ondansetron (ZOFRAN) IV, ondansetron, traZODone   Vital Signs    Vitals:   03/06/22 2039 03/06/22 2050 03/07/22 0130 03/07/22 0525  BP:  122/81 124/85 103/60  Pulse:  84 86 75  Resp:  18 18 17   Temp: 98.5 F (36.9 C) 97.9 F (36.6 C) 98.5 F (36.9 C) 98.8 F (37.1 C)  TempSrc: Oral  Oral   SpO2:  93% 94% 95%  Weight:  119.3 kg    Height:        Intake/Output Summary (Last 24 hours) at 03/07/2022 0747 Last data filed at 03/07/2022 0500 Gross per 24 hour  Intake 294.97 ml  Output --  Net 294.97 ml      03/06/2022    8:50 PM 03/06/2022   11:53 AM 02/07/2022   11:07 AM  Last 3 Weights  Weight (lbs) 263 lb 0.1 oz 260 lb 267 lb  Weight (kg)  119.3 kg 117.935 kg 121.11 kg      Telemetry    NSR, HR in 60's to 80's.  - Personally Reviewed  ECG    NSR, HR 84 with no acute ST changes.  - Personally Reviewed  Physical Exam   GEN: Pleasant male appearing in no acute distress.   Neck: No JVD Cardiac: RRR, no murmurs, rubs, or gallops.  Respiratory: Clear to auscultation bilaterally. GI: Soft, nontender, non-distended  MS: No pitting edema; No deformity. Neuro:  Nonfocal  Psych: Normal affect   Labs    High Sensitivity Troponin:   Recent Labs  Lab 03/06/22 1221 03/06/22 1351 03/06/22 1546  TROPONINIHS <2 <2 <2     Chemistry Recent Labs  Lab 03/06/22 1221  NA 137  K 3.9  CL 105  CO2 24  GLUCOSE 174*  BUN 16  CREATININE 0.76  CALCIUM 10.0  GFRNONAA >60  ANIONGAP 8    Lipids No results for input(s): "CHOL", "TRIG", "HDL", "LABVLDL", "LDLCALC", "CHOLHDL" in the last 168 hours.  Hematology Recent Labs  Lab 03/06/22 1221  WBC 7.6  RBC 5.45  HGB 16.6  HCT 47.8  MCV 87.7  MCH 30.5  MCHC 34.7  RDW 13.3  PLT 171   Thyroid No results for input(s): "TSH", "FREET4" in the last 168 hours.  BNPNo results for input(s): "BNP", "PROBNP" in the last 168 hours.  DDimer  Recent Labs  Lab 03/06/22 1546  DDIMER <0.27     Radiology    CT Angio Chest/Abd/Pel for Dissection W and/or Wo Contrast  Result Date: 03/06/2022 CLINICAL DATA:  Left-sided chest and shoulder pain x4 days with lightheadedness. EXAM: CT ANGIOGRAPHY CHEST, ABDOMEN AND PELVIS TECHNIQUE: Non-contrast CT of the chest was initially obtained. Multidetector CT imaging through the chest, abdomen and pelvis was performed using the standard protocol during bolus administration of intravenous contrast. Multiplanar reconstructed images and MIPs were obtained and reviewed to evaluate the vascular anatomy. RADIATION DOSE REDUCTION: This exam was performed according to the departmental dose-optimization program which includes automated exposure control,  adjustment of the mA and/or kV according to patient size and/or use of iterative reconstruction technique. CONTRAST:  141m OMNIPAQUE IOHEXOL 350 MG/ML SOLN COMPARISON:  March 16, 2021 FINDINGS: CTA CHEST FINDINGS Cardiovascular: There is mild calcification of the thoracic aorta, without evidence of aortic aneurysm or dissection. Satisfactory opacification of the pulmonary arteries to the segmental level. No evidence of pulmonary embolism. Normal heart size with marked severity coronary artery calcification. No pericardial effusion. Mediastinum/Nodes: No enlarged mediastinal, hilar, or axillary lymph nodes. Thyroid gland, trachea, and esophagus demonstrate no significant findings. Lungs/Pleura: Mild right upper lobe and posterolateral left lower lobe linear scarring and/or atelectasis is seen. There is no evidence of a pleural effusion or pneumothorax. Musculoskeletal: No chest wall abnormality. No acute or significant osseous findings. Review of the MIP images confirms the above findings. CTA ABDOMEN AND PELVIS FINDINGS VASCULAR Aorta: Moderate severity calcification of a normal caliber aorta without aneurysm, dissection, vasculitis or significant stenosis. Celiac: Patent without evidence of aneurysm, dissection, vasculitis or significant stenosis. SMA: Patent without evidence of aneurysm, dissection, vasculitis or significant stenosis. Renals: Both renal arteries are patent without evidence of aneurysm, dissection, vasculitis, fibromuscular dysplasia or significant stenosis. IMA: Patent without evidence of aneurysm, dissection, vasculitis or significant stenosis. Inflow: Patent without evidence of aneurysm, dissection, vasculitis or significant stenosis. Veins: No obvious venous abnormality within the limitations of this arterial phase study. Review of the MIP images confirms the above findings. NON-VASCULAR Hepatobiliary: There is diffuse fatty infiltration of the liver parenchyma. No focal liver abnormality is  seen. Status post cholecystectomy. No biliary dilatation. Pancreas: Unremarkable. No pancreatic ductal dilatation or surrounding inflammatory changes. Spleen: Normal in size without focal abnormality. Adrenals/Urinary Tract: Adrenal glands are unremarkable. Kidneys are normal in size, without renal calculi or hydronephrosis. Focal scarring is seen along the anterior aspect of the mid to lower left kidney. A 1.3 cm diameter simple cyst is seen within the mid right kidney. The urinary bladder is moderately distended and otherwise unremarkable. Stomach/Bowel: Stomach is within normal limits. Appendix appears normal. No evidence of bowel wall thickening, distention, or inflammatory changes. Lymphatic: No abnormal abdominal or pelvic lymph nodes are identified. Reproductive: Prostate is unremarkable. Other: There is a 3.3 cm x 2.6 cm fat containing right inguinal hernia. A 3.1 cm x 2.4 cm fat containing left inguinal hernia is also noted. Musculoskeletal: No acute or significant osseous findings. Review of the MIP images confirms the above findings. IMPRESSION: 1. No evidence of aortic aneurysm or dissection. 2. No evidence of pulmonary embolism. 3. Marked severity coronary artery calcification. 4. Fatty infiltration of the liver. 5. Bilateral fat containing inguinal hernias. Aortic Atherosclerosis (  ICD10-I70.0). Electronically Signed   By: Virgina Norfolk M.D.   On: 03/06/2022 18:34   DG Chest 2 View  Result Date: 03/06/2022 CLINICAL DATA:  Chest pain EXAM: CHEST - 2 VIEW COMPARISON:  03/14/2021 FINDINGS: Cardiomegaly. Both lungs are clear. Disc degenerative disease of the thoracic spine. IMPRESSION: Cardiomegaly without acute abnormality of the lungs. Electronically Signed   By: Delanna Ahmadi M.D.   On: 03/06/2022 12:19    Cardiac Studies   LHC: 02/2018 Ost 2nd Diag to 2nd Diag lesion is 25% stenosed. Prior LAD stent is widely patent with minimal restenosis. Mid LAD-2 lesion is 25% stenosed. Mid RCA  lesion is 10% stenosed. The left ventricular systolic function is normal. LV end diastolic pressure is normal. The left ventricular ejection fraction is 55-65% by visual estimate. There is no aortic valve stenosis. JL4 works well from the right wrist.   Continue aggressive secondary prevention.  Investigate noncardiac causes of chest pain.  Echo: 07/2020 IMPRESSIONS     1. Left ventricular ejection fraction, by estimation, is 60 to 65%. The  left ventricle has normal function. The left ventricle has no regional  wall motion abnormalities. There is mild left ventricular hypertrophy.  Left ventricular diastolic parameters  were normal.   2. Right ventricular systolic function is normal. The right ventricular  size is normal.   3. Left atrial size was mildly dilated.   4. The mitral valve is normal in structure. Trivial mitral valve  regurgitation. No evidence of mitral stenosis.   5. The aortic valve is normal in structure. Aortic valve regurgitation is  not visualized. No aortic stenosis is present.   6. The inferior vena cava is normal in size with greater than 50%  respiratory variability, suggesting right atrial pressure of 3 mmHg.   Patient Profile     61 y.o. male w/ PMH of CAD (s/p DES to mid-LAD in 11/2013 with patent stent by repeat cath in 09/2015 and 02/2018 with 25% residual stenosis along D2 and mid-LAD), HTN, HLD and Type 2 DM who is currently admitted for evaluation of chest pain.   Assessment & Plan    1. Chest Pain with Mixed Features/CAD - Presented with chest pain which had been present for over 4 days prior to admission with associated dyspnea. Symptoms would improve with SL NTG but not fully resolve. - Hs Troponin values have been negative at < 2 and EKG is without acute ST changes. D-dimer negative. CTA showed no evidence of aneurysm or PE. Noted to have coronary artery calcification.  - Echo pending for later today. Will review with MD but anticipate a  Lexiscan Myoview today for ischemic evaluation given his mixed features. Would await improvement in his headache as this can be exacerbated by Leixscan. His wife is concerned since symptoms resemble his NSTEMI symptoms from 2015 and symptoms were similar in 2019 as well but cath was reassuring at that time. Also discussed catheterization as well and they are open to either option.  - Remains on ASA 28m daily, Atorvastatin (will adjust dose to 86mdaily), Zetia 1078maily and Ranexa 1000m25mD.   2. HTN - BP is soft at 103/60 this AM. He has been continued on PTA Propranolol 20mg18m and Losartan 25mg 39my but will hold both for now as recommended at the time of admission given his soft readings.   3. HLD - FLP in 01/2022 showed total cholesterol of 194, HDL 38 and LDL 94. Remains on Atorvastatin 40mg d36m and Zetia  19m daily. Will titrate Atorvastatin to 88mdaily. Repeat FLP and LFT's in 6-8 weeks.   4. Type 2 DM - Hgb A1c at 7.5 in 01/2022. Management per the admitting team. He has been continued on [place] SSI.  For questions or updates, please contact [facility] consult [email or web address] for contact info under        Signed, BrErma HeritagePA-C  03/07/2022, 7:47 AM    Personally seen and examined. Agree with APP above with the following comments:  Briefly [age]o M with a hx of prior mLAD PCI in to 215 with no obstructive disease in 2017 and 2019 (last LHC) with chest pain, similar to these prior.  Patient notes that he has improved chest pain by profound nausea on the nitro patch.   No SOB.  Nausea and pain improving later in the AM. No murmur, CTAB.  Exam similar to above. Tele: SR Personally reviewed relevant tests; limited echo shows no WMAs, will formally read shortly but reviewed with patient at bedside.  Would recommend  - will plan for stress test; [facility] given limitations of tolerance to medical  therapy.  If normal would look for non cardiac cause of CP  MaRudean HaskellMD Cardiologist Hypertrophic [address] [place] [phone]:31 AM

## 2022-03-07 NOTE — TOC Progression Note (Signed)
Transition of Care [facility]) - Progression Note    Patient Details  Name: Jared Griffin MRN: [id number] Date of Birth: November 19, 1960  Transition of Care [facility]) CM/SW Contact  Salome Arnt, [place] Phone Number: 03/07/2022, 10:16 AM  Clinical Narrative:   Transition of Care [facility]) Screening Note   Patient Details  Name: Jared Griffin Date of Birth: 08/13/1961   Transition of Care [facility]) CM/SW Contact:    Salome Arnt, [place] Phone Number: 03/07/2022, 10:17 AM    Transition of Care Department [facility]) has reviewed patient and no TOC needs have been identified at this time. We will continue to monitor patient advancement through interdisciplinary progression rounds. If new patient transition needs arise, please place a TOC consult.            Expected Discharge Plan and Services                                                 Social Determinants of Health (SDOH) Interventions    Readmission Risk Interventions     No data to display

## 2022-03-07 NOTE — Progress Notes (Signed)
*  PRELIMINARY RESULTS* Echocardiogram 2D Echocardiogram has been performed.  Jared Griffin 03/07/2022, 9:35 AM

## 2022-03-08 ENCOUNTER — Encounter (HOSPITAL_COMMUNITY): Payer: Medicare Other | Admitting: Physical Therapy

## 2022-03-08 DIAGNOSIS — R0789 Other chest pain: Secondary | ICD-10-CM | POA: Diagnosis not present

## 2022-03-08 LAB — BASIC METABOLIC PANEL
Anion gap: 8 (ref 5–15)
BUN: 16 mg/dL (ref 6–20)
CO2: 26 mmol/L (ref 22–32)
Calcium: 8.8 mg/dL — ABNORMAL LOW (ref 8.9–10.3)
Chloride: 102 mmol/L (ref 98–111)
Creatinine, Ser: 1.09 mg/dL (ref 0.61–1.24)
GFR, Estimated: >60 mL/min (ref >60)
Glucose, Bld: 254 mg/dL — ABNORMAL HIGH (ref 70–99)
Potassium: 3.8 mmol/L (ref 3.5–5.1)
Sodium: 136 mmol/L (ref 135–145)

## 2022-03-08 LAB — GLUCOSE, CAPILLARY: Glucose-Capillary: 207 mg/dL — ABNORMAL HIGH (ref 70–99)

## 2022-03-08 MED ORDER — KETOROLAC TROMETHAMINE 15 MG/ML IJ SOLN
15.0000 mg | Freq: Once | INTRAMUSCULAR | Status: AC
Start: 2022-03-08 — End: 2022-03-08
  Administered 2022-03-08: 15 mg via INTRAVENOUS
  Filled 2022-03-08: qty 1

## 2022-03-08 NOTE — Discharge Summary (Signed)
Physician Discharge Summary   Patient: Jared Griffin MRN: [id number] DOB: 1961-01-05  Admit date:     03/06/2022  Discharge date: 03/08/22  Discharge Physician: Deatra Roldan   PCP: [name], MD   Recommendations at discharge:   Follow-up with cardiology and PCP within 2-4 weeks Specific follow-up with PCP with current modification of medication, polypharmacy, better glycemic control, adjusting diabetic medications Increase activity, healthier cardiac/diabetic diet recommended  Discharge Diagnoses: Principal Problem:   Atypical chest pain Active Problems:   CAD S/P LAD DES March 2015   DM2 (diabetes mellitus, type 2) ([place])   Essential hypertension, benign   Esophageal reflux   NASH (nonalcoholic steatohepatitis)   Mixed hyperlipidemia  Resolved Problems:   Chest pain  Hospital Course: Jared Griffin is a 61 y.o. male with a hx of CAD with history of mLAD PCI in 11/2013, HTN, HLD, DMII, COPD, anxiety and depression who follows with Dr. [name]. Last cath in 2017 showed mild-to-moderate mLAD stenosis beyond the stent with no significant in-stent restenosis. Last TTE 07/2020 with LVEF 60-65%, no WMA, normal RV, no significant valve disease. Was last seen in clinic by Dr. [name] on 01/2022 where he was doing well from a CV.    Patient reports that he has had persistent chest pain and SOB since Friday along with increased fatigue. Symptoms were intermittent but became constant. SLG NTG relieved symptoms. Due to persistent symptoms he presents to [facility] for evaluation.    ED Course: afebrile  106/81. Patient's chest pain continued but lessened. Lab: Cmet remarkable for glucose 174, Mg 1.4, CBCD nl, D-dimer <0.27. Troponins <2 x 2. EKG without acute changes. Exam was unremarkable. Dr. Johney Frame consulted: recommended r/o PE. Asked that [place] admit and arrange for TTE as well as close monitoring.     Assessment and Plan: * Atypical chest pain Patient with CAD s/p multiple  caths, s/p PCI LAD stent. Last Cath 02/26/18 open LAD stent, EF 55-65%, mild non-obstruction disease ostial vessels. Now with persistent chest pain and SOB/DOE. Troponins <2 x 2, EKG w/o Acute changes. He is at high risk with DM, HLD, HTN prior disease. He has r/o for PE by CTA. Dr. Gwyndolyn Kaufman has consulted on the patient.  - Chest pain improved continue telemetry  Continue home meds  Cardiology to follow: 2D echocardiogram, stress test today 03/07/2022   - Echocardiogram and nuclear stress test:  Echocardiogram EJ EF 60-65%, normal LV function no significant wall motion abnormalities mild LVH grade 1 diastolic dysfunction Right ventricle systolic function normal. Negative valvular abnormalities, mild aortic dilatation  Nuclear stress test:  IMPRESSIONS Negative for stress induced arrhythmias.  Normal left ventricular function. Normal LV size. There is a perfusion defect at apical septum in rest and stress with no wall motion.  This could be consistent with very small infarct from prior LAD disease and prior PCI vs artifact.     CAD S/P LAD DES March 2015 -Resolved -Known CAD with multiple caths. Last saw Dr. [name] in May and was stable. Risk modification in place with good control of HLD, Last A1C 02/07/22 7.5%   -ACS was ruled out, status post 2D echocardiogram, nuclear stress test -Cleared by cardiology to be discharged home, no further recommendation or change in current medication   NASH (nonalcoholic steatohepatitis) Labs are stable.  Esophageal reflux No report of esophagitis. Continue PPI    Essential hypertension, benign -Resuming home meds  DM2 (diabetes mellitus, type 2) ([place]) Patient adherent to medical regimen including  basal insulin, SGLT2 inhibitor (Farxiga), meal time insulin, metformin.  Last A1C 7.5% 02/07/22.  - Continue home regimen   Mixed hyperlipidemia Patient on multiple agents.  Last lipid panel 02/07/22 T chol 172, HDL 38, LDL  51. Lipid Panel     Component Value Date/Time   CHOL 194 02/07/2022 0938   TRIG 372 (H) 02/07/2022 0938   HDL 38 (L) 02/07/2022 0938   CHOLHDL 5.1 (H) 02/07/2022 0938   CHOLHDL 6.4 07/12/2018 0713   VLDL UNABLE TO CALCULATE IF TRIGLYCERIDE OVER 400 mg/dL 07/12/2018 0713   LDLCALC 94 02/07/2022 0938   LABVLDL 62 (H) 02/07/2022 0938    Continue statins    Consultants: Cardiology Procedures performed: Echocardiogram and nuclear stress test Disposition: Home Diet recommendation:  Discharge Diet Orders (From admission, onward)     Start     Ordered   03/08/22 0000  Diet - low sodium heart healthy        03/08/22 0736           Cardiac and Carb modified diet DISCHARGE MEDICATION: Allergies as of 03/08/2022       Reactions   Doxycycline    Severe esophagitis        Medication List     TAKE these medications    acetaminophen 500 MG tablet Commonly known as: TYLENOL Take 1,000 mg by mouth every 8 (eight) hours as needed for moderate pain.   Advair HFA 115-21 MCG/ACT inhaler Generic drug: fluticasone-salmeterol INHALE 2 PUFFS INTO THE LUNGS TWICE DAILY   albuterol 108 (90 Base) MCG/ACT inhaler Commonly known as: VENTOLIN HFA INHALE 2 PUFFS INTO LUNGS EVERY 6 HOURS AS NEEDED FOR WHEEZING AND SHORTNESS OF BREATH. What changed:  how much to take how to take this when to take this reasons to take this   ALPRAZolam 1 MG tablet Commonly known as: Xanax Take 1 tablet (1 mg total) by mouth 3 (three) times daily as needed for anxiety.   aspirin EC 81 MG tablet Take 1 tablet (81 mg total) by mouth daily.   atorvastatin 40 MG tablet Commonly known as: LIPITOR TAKE ONE TABLET (40MG TOTAL) BY MOUTH DAILY What changed: See the new instructions.   B-D UF III MINI PEN NEEDLES 31G X 5 MM Misc Generic drug: Insulin Pen Needle USE UP TO 4 TIMES DAILY WITH INSULIN AS DIRECTED.   cetirizine 10 MG tablet Commonly known as: ZYRTEC Take 1 tablet (10 mg total) by mouth  daily.   colestipol 1 g tablet Commonly known as: COLESTID Take 2 tablets (2 g total) by mouth daily. Take 4 hours apart from other medication What changed: additional instructions   dapagliflozin propanediol 5 MG Tabs tablet Commonly known as: FARXIGA Take 5 mg by mouth daily.   Doxylamine-Pyridoxine 10-10 MG Tbec Take 1 each by mouth every 12 (twelve) hours as needed (nausea).   ezetimibe 10 MG tablet Commonly known as: Zetia Take 1 tablet (10 mg total) by mouth daily.   fluticasone 50 MCG/ACT nasal spray Commonly known as: FLONASE Place 2 sprays into both nostrils daily. What changed:  when to take this reasons to take this   FreeStyle Libre 14 Day Sensor Misc Inject 1 each into the skin every 14 (fourteen) days. Use as directed.   gabapentin 300 MG capsule Commonly known as: NEURONTIN 2 qam and 2qevening   HumaLOG KwikPen 200 UNIT/ML KwikPen Generic drug: insulin lispro Inject 30-35 Units into the skin 3 (three) times daily.   Lancets Misc 1 each by  Does not apply route 4 (four) times daily.   losartan 25 MG tablet Commonly known as: COZAAR TAKE ONE (1) TABLET BY MOUTH EVERY DAY   metFORMIN 500 MG tablet Commonly known as: GLUCOPHAGE Take 1,000 mg by mouth 2 (two) times daily with a meal.   methocarbamol 500 MG tablet Commonly known as: ROBAXIN Take 500 mg by mouth every 8 (eight) hours as needed for muscle spasms.   nitroGLYCERIN 0.4 MG SL tablet Commonly known as: NITROSTAT Place 1 tablet (0.4 mg total) under the tongue every 5 (five) minutes as needed.   omeprazole 40 MG capsule Commonly known as: PRILOSEC Take 1 capsule (40 mg total) by mouth daily.   ondansetron 8 MG disintegrating tablet Commonly known as: ZOFRAN-ODT TAKE ONE TABLET (8MG TOTAL) BY MOUTH EVERY 8 HOURS AS NEEDED FRO NAUSEA OR VOMITING What changed: See the new instructions.   Ozempic (0.25 or 0.5 MG/DOSE) 2 MG/3ML Sopn Generic drug: Semaglutide(0.25 or 0.5MG/DOS) See admin  instructions.   Ozempic (0.25 or 0.5 MG/DOSE) 2 MG/3ML Sopn Generic drug: Semaglutide(0.25 or 0.5MG/DOS) 0.5 mg   propranolol 20 MG tablet Commonly known as: INDERAL Take 1 tablet (20 mg total) by mouth 2 (two) times daily. What changed: when to take this   ranolazine 1000 MG SR tablet Commonly known as: RANEXA TAKE ONE TABLET (1000MG TOTAL) BY MOUTH TWO TIMES DAILY What changed: See the new instructions.   sertraline 100 MG tablet Commonly known as: ZOLOFT Take 1.5 tablets (150 mg total) by mouth daily.   sildenafil 100 MG tablet Commonly known as: Viagra Take 0.5-1 tablets (50-100 mg total) by mouth daily as needed for erectile dysfunction.   Toujeo Max SoloStar 300 UNIT/ML Solostar Pen Generic drug: insulin glargine (2 Unit Dial) INJECT 130 UNITS INTO THE SKIN AT BEDTIME. What changed: how much to take   traZODone 50 MG tablet Commonly known as: DESYREL Take 1 tablet (50 mg total) by mouth at bedtime as needed.        Discharge Exam: Filed Weights   03/06/22 1153 03/06/22 2050  Weight: 117.9 kg 119.3 kg      Physical Exam:   General:  AAO x 3,  cooperative, no distress;   HEENT:  Normocephalic, PERRL, otherwise with in Normal limits   Neuro:  CNII-XII intact. , normal motor and sensation, reflexes intact   Lungs:   Clear to auscultation BL, Respirations unlabored,  No wheezes / crackles  Cardio:    S1/S2, RRR, No murmure, No Rubs or Gallops   Abdomen:  Soft, non-tender, bowel sounds active all four quadrants, no guarding or peritoneal signs.  Muscular  skeletal:  Limited exam -global generalized weaknesses - in bed, able to move all 4 extremities,   2+ pulses,  symmetric, No pitting edema  Skin:  Dry, warm to touch, negative for any Rashes,  Wounds: Please see nursing documentation          Condition at discharge: good  The results of significant diagnostics from this hospitalization (including imaging, microbiology, ancillary and laboratory)  are listed below for reference.   Imaging Studies: NM Myocar Multi W/Spect W/Wall Motion / EF  Result Date: 03/07/2022   Findings are consistent with no prior ischemia. The study is low risk.   No ST deviation was noted.   Left ventricular function is normal. End diastolic cavity size is normal. End systolic cavity size is normal.   Prior study available for comparison. 2019 study was sub-optimal. IMPRESSIONS Negative for stress induced arrhythmias. Normal  left ventricular function. Normal LV size. There is a perfusion defect at apical septum in rest and stress with no wall motion.  This could be consistent with very small infarct from prior LAD disease and prior PCI vs artifact. CONCLUSIONS No ischemia Very small infarct. Negative study Low risk study.  ECHOCARDIOGRAM COMPLETE  Result Date: 03/07/2022    ECHOCARDIOGRAM REPORT   Patient Name:   Jared Griffin Date of Exam: 03/07/2022 Medical Rec #:  [id number]        Height:       74.0 in Accession #:    [id number]       Weight:       263.0 lb Date of Birth:  [birth year]        BSA:          2.442 m Patient Age:    54 years         BP:           103/60 mmHg Patient Gender: M                HR:           66 bpm. Exam Location:  [name] Procedure: 2D Echo, Cardiac Doppler and Color Doppler Indications:    Chest Pain  History:        Patient has prior history of Echocardiogram examinations, most                 recent 08/09/2020. CAD, Signs/Symptoms:Chest Pain and                 Dizziness/Lightheadedness; Risk Factors:Hypertension, Diabetes                 and Dyslipidemia.  Sonographer:    Wenda Low Referring Phys: [place]  1. Left ventricular ejection fraction, by estimation, is 60 to 65%. The left ventricle has normal function. The left ventricle has no regional wall motion abnormalities. There is mild concentric left ventricular hypertrophy. Left ventricular diastolic parameters are consistent with Grade I diastolic  dysfunction (impaired relaxation).  2. Right ventricular systolic function is normal. The right ventricular size is normal. There is normal pulmonary artery systolic pressure. The estimated right ventricular systolic pressure is 22.9 mmHg.  3. The mitral valve is normal in structure. No evidence of mitral valve regurgitation. No evidence of mitral stenosis.  4. The aortic valve is tricuspid. Aortic valve regurgitation is not visualized. No aortic stenosis is present.  5. Aortic dilatation noted. There is mild dilatation of the aortic root, measuring 42 mm. There is mild dilatation of the ascending aorta, measuring 41 mm. Comparison(s): Aorta is mildly dilated; recommened one year repeat imaging if clinically indicated. FINDINGS  Left Ventricle: Left ventricular ejection fraction, by estimation, is 60 to 65%. The left ventricle has normal function. The left ventricle has no regional wall motion abnormalities. The left ventricular internal cavity size was normal in size. There is  mild concentric left ventricular hypertrophy. Left ventricular diastolic parameters are consistent with Grade I diastolic dysfunction (impaired relaxation). Right Ventricle: The right ventricular size is normal. No increase in right ventricular wall thickness. Right ventricular systolic function is normal. There is normal pulmonary artery systolic pressure. The tricuspid regurgitant velocity is 2.22 m/s, and  with an assumed right atrial pressure of 3 mmHg, the estimated right ventricular systolic pressure is 79.8 mmHg. Left Atrium: Left atrial size was normal in size. Right Atrium: Right atrial size was normal in size.  Pericardium: Trivial pericardial effusion is present. Presence of epicardial fat layer. Mitral Valve: The mitral valve is normal in structure. No evidence of mitral valve regurgitation. No evidence of mitral valve stenosis. MV peak gradient, 3.2 mmHg. The mean mitral valve gradient is 1.0 mmHg. Tricuspid Valve: The tricuspid  valve is normal in structure. Tricuspid valve regurgitation is not demonstrated. No evidence of tricuspid stenosis. Aortic Valve: The aortic valve is tricuspid. Aortic valve regurgitation is not visualized. No aortic stenosis is present. Aortic valve mean gradient measures 2.0 mmHg. Aortic valve peak gradient measures 4.8 mmHg. Aortic valve area, by VTI measures 3.85 cm. Pulmonic Valve: The pulmonic valve was normal in structure. Pulmonic valve regurgitation is not visualized. No evidence of pulmonic stenosis. Aorta: Aortic dilatation noted. There is mild dilatation of the aortic root, measuring 42 mm. There is mild dilatation of the ascending aorta, measuring 41 mm. IAS/Shunts: No atrial level shunt detected by color flow Doppler.  LEFT VENTRICLE PLAX 2D LVIDd:         4.90 cm     Diastology LVIDs:         3.30 cm     LV e' medial:    6.96 cm/s LV PW:         1.30 cm     LV E/e' medial:  9.8 LV IVS:        1.30 cm     LV e' lateral:   8.49 cm/s LVOT diam:     2.40 cm     LV E/e' lateral: 8.0 LV SV:         90 LV SV Index:   37 LVOT Area:     4.52 cm  LV Volumes (MOD) LV vol d, MOD A2C: 72.6 ml LV vol d, MOD A4C: 72.8 ml LV vol s, MOD A2C: 27.6 ml LV vol s, MOD A4C: 21.7 ml LV SV MOD A2C:     45.0 ml LV SV MOD A4C:     72.8 ml LV SV MOD BP:      48.7 ml RIGHT VENTRICLE RV Basal diam:  3.50 cm RV Mid diam:    3.30 cm RV S prime:     13.60 cm/s TAPSE (M-mode): 2.9 cm LEFT ATRIUM           Index        RIGHT ATRIUM           Index LA diam:      3.80 cm 1.56 cm/m   RA Area:     21.10 cm LA Vol (A2C): 47.0 ml 19.25 ml/m  RA Volume:   60.20 ml  24.65 ml/m LA Vol (A4C): 60.2 ml 24.65 ml/m  AORTIC VALVE                    PULMONIC VALVE AV Area (Vmax):    3.62 cm     PV Vmax:       0.73 m/s AV Area (Vmean):   3.61 cm     PV Peak grad:  2.1 mmHg AV Area (VTI):     3.85 cm AV Vmax:           110.00 cm/s AV Vmean:          71.400 cm/s AV VTI:            0.235 m AV Peak Grad:      4.8 mmHg AV Mean Grad:      2.0 mmHg  LVOT Vmax:  88.10 cm/s LVOT Vmean:        56.900 cm/s LVOT VTI:          0.200 m LVOT/AV VTI ratio: 0.85  AORTA Ao Root diam: 4.30 cm Ao Asc diam:  4.00 cm MITRAL VALVE               TRICUSPID VALVE MV Area (PHT): 3.37 cm    TR Peak grad:   19.7 mmHg MV Area VTI:   3.37 cm    TR Vmax:        222.00 cm/s MV Peak grad:  3.2 mmHg MV Mean grad:  1.0 mmHg    SHUNTS MV Vmax:       0.89 m/s    Systemic VTI:  0.20 m MV Vmean:      51.2 cm/s   Systemic Diam: 2.40 cm MV Decel Time: 225 msec MV E velocity: 68.10 cm/s MV A velocity: 71.60 cm/s MV E/A ratio:  0.95 Rudean Haskell MD Electronically signed by Rudean Haskell MD Signature Date/Time: 03/07/2022/11:12:30 AM    Final    CT Angio Chest/Abd/Pel for Dissection W and/or Wo Contrast  Result Date: 03/06/2022 CLINICAL DATA:  Left-sided chest and shoulder pain x4 days with lightheadedness. EXAM: CT ANGIOGRAPHY CHEST, ABDOMEN AND PELVIS TECHNIQUE: Non-contrast CT of the chest was initially obtained. Multidetector CT imaging through the chest, abdomen and pelvis was performed using the standard protocol during bolus administration of intravenous contrast. Multiplanar reconstructed images and MIPs were obtained and reviewed to evaluate the vascular anatomy. RADIATION DOSE REDUCTION: This exam was performed according to the departmental dose-optimization program which includes automated exposure control, adjustment of the mA and/or kV according to patient size and/or use of iterative reconstruction technique. CONTRAST:  131m OMNIPAQUE IOHEXOL 350 MG/ML SOLN COMPARISON:  March 16, 2021 FINDINGS: CTA CHEST FINDINGS Cardiovascular: There is mild calcification of the thoracic aorta, without evidence of aortic aneurysm or dissection. Satisfactory opacification of the pulmonary arteries to the segmental level. No evidence of pulmonary embolism. Normal heart size with marked severity coronary artery calcification. No pericardial effusion. Mediastinum/Nodes: No  enlarged mediastinal, hilar, or axillary lymph nodes. Thyroid gland, trachea, and esophagus demonstrate no significant findings. Lungs/Pleura: Mild right upper lobe and posterolateral left lower lobe linear scarring and/or atelectasis is seen. There is no evidence of a pleural effusion or pneumothorax. Musculoskeletal: No chest wall abnormality. No acute or significant osseous findings. Review of the MIP images confirms the above findings. CTA ABDOMEN AND PELVIS FINDINGS VASCULAR Aorta: Moderate severity calcification of a normal caliber aorta without aneurysm, dissection, vasculitis or significant stenosis. Celiac: Patent without evidence of aneurysm, dissection, vasculitis or significant stenosis. SMA: Patent without evidence of aneurysm, dissection, vasculitis or significant stenosis. Renals: Both renal arteries are patent without evidence of aneurysm, dissection, vasculitis, fibromuscular dysplasia or significant stenosis. IMA: Patent without evidence of aneurysm, dissection, vasculitis or significant stenosis. Inflow: Patent without evidence of aneurysm, dissection, vasculitis or significant stenosis. Veins: No obvious venous abnormality within the limitations of this arterial phase study. Review of the MIP images confirms the above findings. NON-VASCULAR Hepatobiliary: There is diffuse fatty infiltration of the liver parenchyma. No focal liver abnormality is seen. Status post cholecystectomy. No biliary dilatation. Pancreas: Unremarkable. No pancreatic ductal dilatation or surrounding inflammatory changes. Spleen: Normal in size without focal abnormality. Adrenals/Urinary Tract: Adrenal glands are unremarkable. Kidneys are normal in size, without renal calculi or hydronephrosis. Focal scarring is seen along the anterior aspect of the mid to lower left kidney. A 1.3 cm  diameter simple cyst is seen within the mid right kidney. The urinary bladder is moderately distended and otherwise unremarkable. Stomach/Bowel:  Stomach is within normal limits. Appendix appears normal. No evidence of bowel wall thickening, distention, or inflammatory changes. Lymphatic: No abnormal abdominal or pelvic lymph nodes are identified. Reproductive: Prostate is unremarkable. Other: There is a 3.3 cm x 2.6 cm fat containing right inguinal hernia. A 3.1 cm x 2.4 cm fat containing left inguinal hernia is also noted. Musculoskeletal: No acute or significant osseous findings. Review of the MIP images confirms the above findings. IMPRESSION: 1. No evidence of aortic aneurysm or dissection. 2. No evidence of pulmonary embolism. 3. Marked severity coronary artery calcification. 4. Fatty infiltration of the liver. 5. Bilateral fat containing inguinal hernias. Aortic Atherosclerosis (ICD10-I70.0). Electronically Signed   By: Virgina Norfolk M.D.   On: 03/06/2022 18:34   DG Chest 2 View  Result Date: 03/06/2022 CLINICAL DATA:  Chest pain EXAM: CHEST - 2 VIEW COMPARISON:  03/14/2021 FINDINGS: Cardiomegaly. Both lungs are clear. Disc degenerative disease of the thoracic spine. IMPRESSION: Cardiomegaly without acute abnormality of the lungs. Electronically Signed   By: Delanna Ahmadi M.D.   On: 03/06/2022 12:19   XR C-ARM NO REPORT  Result Date: 02/20/2022 Please see Notes tab for imaging impression.  Epidural Steroid injection  Result Date: 02/20/2022 Magnus Sinning, MD     02/21/2022  5:56 AM Lumbar Epidural Steroid Injection - Interlaminar Approach with Fluoroscopic Guidance Patient: Jared Griffin     Date of Birth: 12/20/1960 MRN: [id number] PCP: [name], MD     Visit Date: 02/20/2022  Universal Protocol:   Consent Given By: the patient Position: PRONE Additional Comments: Vital signs were monitored before and after the procedure. Patient was prepped and draped in the usual sterile fashion. The correct patient, procedure, and site was verified. Injection Procedure Details: Procedure diagnoses: Lumbar radiculopathy [M54.16] Meds  Administered: Meds ordered this encounter Medications  methylPREDNISolone acetate (DEPO-MEDROL) injection 80 mg  Laterality: Right Location/Site:  L3-4 Needle: 3.5 in., 20 ga. Tuohy Needle Placement: Paramedian epidural Findings:  -Comments: Excellent flow of contrast into the epidural space. Procedure Details: Using a paramedian approach from the side mentioned above, the region overlying the inferior lamina was localized under fluoroscopic visualization and the soft tissues overlying this structure were infiltrated with 4 ml. of 1% Lidocaine without Epinephrine. The Tuohy needle was inserted into the epidural space using a paramedian approach. The epidural space was localized using loss of resistance along with counter oblique bi-planar fluoroscopic views.  After negative aspirate for air, blood, and CSF, a 2 ml. volume of Isovue-250 was injected into the epidural space and the flow of contrast was observed. Radiographs were obtained for documentation purposes.  The injectate was administered into the level noted above. Additional Comments: The patient tolerated the procedure well Dressing: 2 x 2 sterile gauze and Band-Aid  Post-procedure details: Patient was observed during the procedure. Post-procedure instructions were reviewed. Patient left the clinic in stable condition.   Microbiology: Results for orders placed or performed in visit on 04/11/21  Fecal Fat, Qualitative     Status: None   Collection Time: 05/11/21 11:47 AM   Specimen: Stool  Result Value Ref Range Status   FECAL FAT, QUALITATIVE Normal Normal Final    Comment: .  Ova and parasite examination     Status: None   Collection Time: 05/11/21 11:47 AM   Specimen: Stool  Result Value Ref Range Status   MICRO  NUMBER: [id number]  Final   SPECIMEN QUALITY: Adequate  Final   Source STOOL  Final   STATUS: FINAL  Final   CONCENTRATE RESULT: No ova or parasites seen  Final   TRICHROME RESULT: No ova or parasites seen  Final   COMMENT:    Final    Routine Ova and Parasite exam may not detect some parasites that occasionally cause diarrheal illness. Cryptosporidium Antigen and/or Cyclospora and Isospora Exam may be ordered to detect these parasites. One negative sample does not necessarily rule out  the presence of a parasitic infection.  For additional information, please refer to https://education.[facility].com/faq/FAQ203 (This link is being provided for informational/ educational purposes only.)    *Note: Due to a large number of results and/or encounters for the requested time period, some results have not been displayed. A complete set of results can be found in Results Review.    Labs: CBC: Recent Labs  Lab 03/06/22 1221  WBC 7.6  HGB 16.6  HCT 47.8  MCV 87.7  PLT 160   Basic Metabolic Panel: Recent Labs  Lab 03/06/22 1221 03/08/22 0538  NA 137 136  K 3.9 3.8  CL 105 102  CO2 24 26  GLUCOSE 174* 254*  BUN 16 16  CREATININE 0.76 1.09  CALCIUM 10.0 8.8*   Liver Function Tests: No results for input(s): "AST", "ALT", "ALKPHOS", "BILITOT", "PROT", "ALBUMIN" in the last 168 hours. CBG: Recent Labs  Lab 03/07/22 0803 03/07/22 1115 03/07/22 1622 03/07/22 2052 03/08/22 0739  GLUCAP 192* 199* 219* 261* 207*    Discharge time spent: greater than 45 minutes.  Signed: Deatra Irene, MD [facility] 03/08/2022

## 2022-03-08 NOTE — Plan of Care (Signed)
Discharge instructions and follow up care reviewed with patient.  Patient verbalized understanding of instructions, patient discharged home with wife in stable condition.

## 2022-03-12 ENCOUNTER — Telehealth: Payer: Self-pay

## 2022-03-12 NOTE — Telephone Encounter (Signed)
Transition Care Management Follow-up Telephone Call Date of discharge and from where: [facility] 03/06/22-03/08/22 How have you been since you were released from the hospital? "I am feeling pretty good, slowly getting my strength back". Any questions or concerns? No  Items Reviewed: Did the pt receive and understand the discharge instructions provided? Yes  Medications obtained and verified? Yes  Other? No  Any new allergies since your discharge? No  Dietary orders reviewed? Yes Do you have support at home? Yes -Spouse  Home Care and Equipment/Supplies: Were home health services ordered? no If so, what is the name of the agency? N/A  Has the agency set up a time to come to the patient's home? not applicable Were any new equipment or medical supplies ordered?  No What is the name of the medical supply agency? N/A Were you able to get the supplies/equipment? not applicable Do you have any questions related to the use of the equipment or supplies? No  Functional Questionnaire: (I = Independent and D = Dependent) ADLs: I  Bathing/Dressing- I  Meal Prep- I-spouse cooks  Eating- I  Maintaining continence- I  Transferring/Ambulation- I  Managing Meds- I  Follow up appointments reviewed:  PCP Hospital f/u appt confirmed? No   Specialist Hospital f/u appt confirmed? Yes  Scheduled to see Nada Boozer NP on 04/02/22 @ 3:30. Are transportation arrangements needed? No  If their condition worsens, is the pt aware to call PCP or go to the Emergency Dept.? Yes Was the patient provided with contact information for the PCP's office or ED? Yes Was to pt encouraged to call back with questions or concerns? Yes Jodelle Gross, RN, BSN, CCM Care Management Coordinator Phone: [phone]/Fax: [phone]

## 2022-03-21 ENCOUNTER — Encounter (HOSPITAL_COMMUNITY): Payer: Medicare Other | Admitting: Physical Therapy

## 2022-03-22 ENCOUNTER — Other Ambulatory Visit: Payer: Self-pay | Admitting: "Endocrinology

## 2022-03-22 ENCOUNTER — Encounter (INDEPENDENT_AMBULATORY_CARE_PROVIDER_SITE_OTHER): Payer: Self-pay | Admitting: Gastroenterology

## 2022-03-23 ENCOUNTER — Other Ambulatory Visit: Payer: Self-pay | Admitting: Family Medicine

## 2022-03-26 ENCOUNTER — Encounter (HOSPITAL_COMMUNITY): Payer: Self-pay | Admitting: Physical Therapy

## 2022-03-26 ENCOUNTER — Ambulatory Visit (HOSPITAL_COMMUNITY): Payer: Medicare Other | Attending: Physical Medicine and Rehabilitation | Admitting: Physical Therapy

## 2022-03-26 DIAGNOSIS — M5416 Radiculopathy, lumbar region: Secondary | ICD-10-CM | POA: Insufficient documentation

## 2022-03-26 DIAGNOSIS — M545 Low back pain, unspecified: Secondary | ICD-10-CM | POA: Diagnosis not present

## 2022-03-26 DIAGNOSIS — R2689 Other abnormalities of gait and mobility: Secondary | ICD-10-CM | POA: Diagnosis present

## 2022-03-26 DIAGNOSIS — R29898 Other symptoms and signs involving the musculoskeletal system: Secondary | ICD-10-CM | POA: Insufficient documentation

## 2022-03-26 DIAGNOSIS — M542 Cervicalgia: Secondary | ICD-10-CM | POA: Insufficient documentation

## 2022-03-26 DIAGNOSIS — M6281 Muscle weakness (generalized): Secondary | ICD-10-CM | POA: Diagnosis present

## 2022-03-26 NOTE — Therapy (Signed)
OUTPATIENT PHYSICAL THERAPY TREATMENT NOTE   Patient Name: Jared Griffin MRN: [id number] DOB:11-16-1960, 61 y.o., male Today's Date: 03/26/2022    See note below for Objective Data and Assessment of Progress/Goals.      PCP: [name], MD REFERRING PROVIDER: Lorine Bears, NP   END OF SESSION:   PT End of Session - 03/26/22 0900     Visit Number 11    Number of Visits 21    Date for PT Re-Evaluation 04/05/22    Authorization Type [employer] Medicare (no auth, no VL)    Progress Note Due on Visit 56    PT Start Time 0900    PT Stop Time 0940    PT Time Calculation (min) 40 min    Activity Tolerance Patient tolerated treatment well    Behavior During Therapy WFL for tasks assessed/performed              Past Medical History:  Diagnosis Date   Anxiety    COPD (chronic obstructive pulmonary disease) ([place])    Coronary atherosclerosis of native coronary artery    a. 12/09/2013: s/p PCI with 3.0 x 28 Promus DES to mLAD   Depression    Diabetic neuropathy ([place])    Elbow injury 07/25/2017   Surgery for torn tendon   Essential hypertension    Fatty liver disease, nonalcoholic    GERD (gastroesophageal reflux disease)    Iron deficiency anemia 08/15/2021   Lateral epicondylitis of right elbow    Mixed hyperlipidemia    Obesity    Osgood-Schlatter's disease of right knee    Skin cyst 10/03/2017   Removed from back   Type 2 diabetes mellitus ([place])    Past Surgical History:  Procedure Laterality Date   BIOPSY  01/21/2019   Procedure: BIOPSY;  Surgeon: [name], MD;  [facility];  Service: Endoscopy;;  gastric bx and gastric polyps   BIOPSY  11/08/2020   Procedure: BIOPSY;  Surgeon: [name], MD;  [facility];  Service: Gastroenterology;;   CARDIAC CATHETERIZATION  2011   CARDIAC CATHETERIZATION N/A 09/20/2015   Procedure: Left Heart Cath and Coronary Angiography;  Surgeon: [name], MD;  Location: [place] [facility];  Service: Cardiovascular;  Laterality: N/A;   CHOLECYSTECTOMY  2003   COLONOSCOPY  06/19/2012   Procedure: COLONOSCOPY;  Surgeon: [name], MD;  [facility];  Service: Endoscopy;  Laterality: N/A;  930   COLONOSCOPY N/A 10/17/2017   Procedure: COLONOSCOPY;  Surgeon: [name], MD;  [facility];  Service: Endoscopy;  Laterality: N/A;  1225   COLONOSCOPY WITH PROPOFOL N/A 11/08/2020   Procedure: COLONOSCOPY WITH PROPOFOL;  Surgeon: [name], MD;  [facility];  Service: Gastroenterology;  Laterality: N/A;  10:45   COLONOSCOPY WITH PROPOFOL N/A 04/04/2021   Procedure: COLONOSCOPY WITH PROPOFOL;  Surgeon: [name], MD;  [facility];  Service: Gastroenterology;  Laterality: N/A;   CYST EXCISION  07/2019   ESOPHAGOGASTRODUODENOSCOPY (EGD) WITH PROPOFOL N/A 01/21/2019   Procedure: ESOPHAGOGASTRODUODENOSCOPY (EGD) WITH PROPOFOL;  Surgeon: [name], MD;  [facility];  Service: Endoscopy;  Laterality: N/A;  10:15am   ESOPHAGOGASTRODUODENOSCOPY (EGD) WITH PROPOFOL N/A 02/17/2021   Procedure: ESOPHAGOGASTRODUODENOSCOPY (EGD) WITH PROPOFOL;  Surgeon: [name], MD;  [facility];  Service: Gastroenterology;  Laterality: N/A;  10:20   ESOPHAGOGASTRODUODENOSCOPY (EGD) WITH PROPOFOL N/A 04/04/2021  Procedure: ESOPHAGOGASTRODUODENOSCOPY (EGD) WITH PROPOFOL;  Surgeon: [name], MD;  [facility];  Service: Gastroenterology;  Laterality: N/A;  12:30   GIVENS CAPSULE STUDY N/A 09/13/2021   Procedure: GIVENS CAPSULE STUDY;  Surgeon: [name], MD;  [facility];  Service: Gastroenterology;  Laterality: N/A;  7:30   KNEE ARTHROPLASTY Right 1999   LEFT HEART CATH AND CORONARY ANGIOGRAPHY N/A 02/26/2018   Procedure: LEFT HEART CATH AND CORONARY ANGIOGRAPHY;  Surgeon: [name], MD;  Location: [place] [facility];  Service: Cardiovascular;  Laterality: N/A;   LEFT HEART CATHETERIZATION WITH CORONARY ANGIOGRAM N/A 12/09/2013   Procedure: LEFT HEART CATHETERIZATION WITH CORONARY ANGIOGRAM;  Surgeon: [name], MD;  Location: [facility];  Service: Cardiovascular;  Laterality: N/A;   Liver biopsy     POLYPECTOMY  10/17/2017   Procedure: POLYPECTOMY;  Surgeon: [name], MD;  [facility];  Service: Endoscopy;;  ascending colon, splenic flexure,sigmoid x2   POLYPECTOMY  11/08/2020   Procedure: POLYPECTOMY;  Surgeon: [name], MD;  [facility];  Service: Gastroenterology;;   POLYPECTOMY  04/04/2021   Procedure: POLYPECTOMY;  Surgeon: [name], MD;  [facility];  Service: Gastroenterology;;   TENNIS ELBOW RELEASE/NIRSCHEL PROCEDURE Right 07/25/2017   Procedure: TENNIS ELBOW RELEASE and debriedment;  Surgeon: [name], MD;  [facility]: [facility];  Service: Orthopedics;  Laterality: Right;   TONSILLECTOMY  1970's   Patient Active Problem List   Diagnosis Date Noted   Atypical chest pain 03/06/2022   Diarrhea 12/14/2021   Iron deficiency anemia 08/15/2021   Orthostatic hypotension 12/07/2020   IBS (irritable bowel syndrome) 11/17/2020   Chronic diarrhea 08/17/2020   NASH (nonalcoholic steatohepatitis) 08/17/2020   Elevated LFTs 08/17/2020   Tremor of both hands 05/06/2020   Bilateral occipital neuralgia 05/06/2020   Abdominal pain, chronic, epigastric 01/13/2019   Anxiety 01/04/2019   Radicular pain of left lower extremity 12/22/2018   Moderate persistent asthma 10/25/2018   Dizziness 07/12/2018   Nonintractable headache    Abnormal nuclear stress test    Aftercare following surgery 07/25/17 08/13/2017   Hx of colonic polyps 08/07/2017   Lateral epicondylitis of right elbow    Cervical nerve root impingement 12/09/2015   Generalized anxiety disorder 09/27/2015   Pain in the chest    Depression 01/24/2015    Esophageal reflux 01/24/2015   Preoperative cardiovascular examination 06/28/2014   DM type 2 causing vascular disease ([place])    Mixed hyperlipidemia    Obesity    Intermediate coronary syndrome ([place]) 12/09/2013   Lumbago 05/21/2012   Essential hypertension, benign 12/28/2009   CAD S/P LAD DES March 2015 12/28/2009   Hyperlipidemia LDL goal <70 11/25/2009   Accelerating angina ([place]) 11/25/2009   DM2 (diabetes mellitus, type 2) ([place]) 11/24/2009   ABDOMINAL PAIN, HX OF 11/24/2009    REFERRING DIAG: M47.816 (ICD-10-CM) - Spondylosis without myelopathy or radiculopathy, lumbar region M54.50,G89.29 (ICD-10-CM) - Chronic bilateral low back pain without sciatica M47.816 (ICD-10-CM) - Facet hypertrophy of lumbar region M79.18 (ICD-10-CM) - Myofascial pain syndrome   THERAPY DIAG:  Low back pain, unspecified back pain laterality, unspecified chronicity, unspecified whether sciatica present  Radiculopathy, lumbar region  Cervicalgia  Other abnormalities of gait and mobility  Muscle weakness (generalized)  PERTINENT HISTORY: Chronic back pain  PRECAUTIONS: None  SUBJECTIVE: Patient reports he has been in he hospital with chest pain. He had a bunch of tests bunch and  they think it was "angina flare up". He is doing better, but still hurting a little bit. He has a follow up with cardio next week. Back is not bothering him much. Still some headaches and migraines.   PAIN:  Are you having pain? Yes: NPRS scale: 2/10 Pain location: base of neck  Pain description: pressure Aggravating factors: "nothing in particular" Relieving factors: Unsure   OBJECTIVE:      POSTURE:  Forward head   PALPATION: Mod TTP about bilateral lumbar paraspinals    LUMBAR ROM:    Active  A/PROM  12/14/2021 AROM 12/26/21 AROM  01/01/22 AROM  01/25/22 AROM 02/22/22  Flexion 50% limited* 50% limited* 25% limited  10% limited  10% limited  Extension 50% limited* 50% limited * 50% limited * 30% limited 25%  limited   Right lateral flexion 25% limited*    10% limited  Left lateral flexion 25% limited*    10% limited   Right rotation 25% limited      Left rotation 25% limited       (Blank rows = not tested)    Cervical AROM: Flexion 40 deg Extension 27 deg  RT rotation 25 deg LT rotation 40 deg    LE MMT:   MMT Right 12/14/2021 Right 01/25/2022 Right  02/22/22 Left 12/14/2021 Left 01/25/2022 Left 02/22/22  Hip flexion 4+/5 5/5 5 4+/5 5/5 5  Hip extension 4/5 4+/5 5 4/5 4+/5 5  Hip abduction 4/5 5/5 5 4+/5 4/5 4+  Hip adduction          Hip internal rotation          Hip external rotation          Knee flexion 5/5 5/5  5/5 5/5   Knee extension 5/5 5/5  5/5 5/5   Ankle dorsiflexion 5/5 5/5  5/5 5/5   Ankle plantarflexion          Ankle inversion          Ankle eversion           (Blank rows = not tested)   LUMBAR SPECIAL TESTS:  (+) Sacral compression    FUNCTIONAL TESTS:  2 minute walk test: 452 feet no AD    TODAY'S TREATMENT  03/26/22 Chin tuck 15 x 5" Upper trap stretch 3 x 20" Levator stretch 3 x 20"  Scap retraction 15 x 5"  Cervical sidebend iso 10 x 5"    Trigger Point Dry-Needling  Treatment instructions: Expect mild to moderate muscle soreness. S/S of pneumothorax if dry needled over a lung field, and to seek immediate medical attention should they occur. Patient verbalized understanding of these instructions and education.  Patient Consent Given: Yes Education handout provided: Yes Muscles treated: Bilat cervical paraspinals  Electrical stimulation performed: No Parameters: N/A Treatment response/outcome: good tolerance    Manual STM to Bilat cervical paraspinals pre and post dry needling for trigger point identification and surface area preparation    02/27/22 UBE 3/3 FWD/ Back lv 1 Band rows GTB 2 x 10  Shoulder extension GTB 2 x 10 Band ER GTB 2 x 10 Seated UT stretch 3 x 20" Palloff press GTB 2 x 10   Manual STM to bilateral thoracic paraspinals  pre and post dry needling for trigger point identification and surface area preparation   Trigger Point Dry-Needling  Treatment instructions: Expect mild to moderate muscle soreness. S/S of pneumothorax if dry needled over a lung field, and to seek immediate medical attention should  they occur. Patient verbalized understanding of these instructions and education.  Patient Consent Given: Yes Education handout provided: Previously provided Muscles treated: bilateral thoracic paraspinals (2 left, 1 right) Electrical stimulation performed: No Parameters: N/A Treatment response/outcome: good tolerance      02/22/22 Reassess MMT AROM  FOTO: 48% function Chin tuck x5 Scap retraction x5 Cervical rotation x5  Manual STM to bilateral upper trap pre and post dry needling for trigger point identification and surface area preparation   Trigger Point Dry-Needling  Treatment instructions: Expect mild to moderate muscle soreness. S/S of pneumothorax if dry needled over a lung field, and to seek immediate medical attention should they occur. Patient verbalized understanding of these instructions and education.  Patient Consent Given: Yes Education handout provided: Previously provided Muscles treated: bilateral upper trap Electrical stimulation performed: No Parameters: N/A Treatment response/outcome: good tolerance, several twitches noted RT>LT   02/07/22  DRY NEEDLING: Dry needling consent given? yes Educational handouts provided? Previously given  Muscles treated: Lower R lumbar multifidi  Response from dry needling: good tolerance   Manual STM to bilateral lumbar paraspinals pre and post dry needling for trigger point identification and surface area preparation   DKTC with heels on green ball 10 x 5 second holds; 2 sets Prone hip extension 2x 10 bilateral     PATIENT EDUCATION:  Education details: exercise form and function Person educated: Patient Education method: Explanation,  Demonstration, and Handouts Education comprehension: verbalized understanding, returned demonstration, verbal cues required, and tactile cues required       HOME EXERCISE PROGRAM: 01/11/22 Ab march SLR [employer]   Access Code: [email or web address] URL: [email or web address] Date: 01/01/2022 Prepared by: Josue Hector  Exercises - Sidelying Thoracic and Shoulder Rotation  - 2-3 x daily - 7 x weekly - 1 sets - 10 reps - 5-10 second hold - Supine Piriformis Stretch with Foot on Ground  - 2-3 x daily - 7 x weekly - 1 sets - 3 reps - 20-30 second holdExercises - Seated Thoracic Lumbar Extension with Pectoralis Stretch  - 1 x daily - 7 x weekly - 1 sets - 10 reps - Shoulder Extension with Resistance  - 1 x daily - 7 x weekly - 3 sets - 10 reps - Scapular Retraction with Resistance  - 1 x daily - 7 x weekly - 3 sets - 10 reps - Seated Hamstring Stretch with Strap  - 1 x daily - 7 x weekly - 1 sets - 3 reps - 30 sec hold - Slant Board Gastrocnemius Stretch  - 1 x daily - 7 x weekly - 1 sets - 3 reps - 30 sec hold   Exercises - Supine Bridge  - 2 x daily - 7 x weekly - 1-2 sets - 10 reps - 5 second hold - Supine March  - 2 x daily - 7 x weekly - 1-2 sets - 10 reps - Supine Lower Trunk Rotation  - 2 x daily - 7 x weekly - 1 sets - 10 reps - 5 second hold3/30/23 press up  02/22/22 Chin tuck  Scap retraction  Cervical rotation    ASSESSMENT:   CLINICAL IMPRESSION:  Patient tolerated session well today. Limited strenuous activity per ongoing cardiac testing, though patient reports no known restriction. Good return with cervical and scapular isometrics. Performed dry needling to cervical paraspinals. Patient with good tolerance though noting symptoms "about the same" following. Patient will continue to benefit from skilled therapy services to reduce remaining deficits and improve functional ability.  OBJECTIVE IMPAIRMENTS Abnormal gait, decreased activity tolerance, decreased endurance,  decreased mobility, difficulty walking, decreased ROM, decreased strength, hypomobility, increased muscle spasms, impaired flexibility, improper body mechanics, postural dysfunction, and pain.    ACTIVITY LIMITATIONS cleaning, community activity, driving, meal prep, laundry, yard work, shopping, and yard work.    PERSONAL FACTORS Fitness, Past/current experiences, Time since onset of injury/illness/exacerbation, and 3+ comorbidities: chronic back pain, DM, increased BMI, cardiac   are also affecting patient's functional outcome.      REHAB POTENTIAL: Good   CLINICAL DECISION MAKING: Stable/uncomplicated   EVALUATION COMPLEXITY: Low     GOALS: Goals reviewed with patient? Yes   SHORT TERM GOALS: Target date: 01/04/2022   Patient will be independent with HEP in order to improve functional outcomes. Baseline:  Goal status: MET    2.  Patient will report at least 25% improvement in symptoms for improved quality of life. Baseline: Reports 60% Goal status: MET       LONG TERM GOALS: Target date: 01/25/2022   Patient will report at least 75% improvement in (lumbar) symptoms for improved quality of life. Baseline: Reports 75%  Goal status: MET (modified)   2.  Patient will improve FOTO score by at least 10 points in order to indicate improved tolerance to activity. Baseline: Improved 5%  Goal status: Ongoing   3.  Patient will demonstrate at least 25% improvement in lumbar ROM in all restricted planes for improved ability to move trunk while completing chores. Baseline: See AROM  Goal status: Partially MET   4.  Patient will be able to ambulate at least 450 feet in 2MWT with pain no greater than 2/10 in order to demonstrate improved tolerance to activity. Baseline: Distance no issue but baseline 5/10 pain  Goal status: Partially MET  5. Patient improve RT cervical rotation by at least 10 degrees in order to improve ability to scan environment for safety and while  driving. Baseline: 25 degrees Goal status: NEW  6.  Patient will report at least 75% improvement in (cervical) symptoms for improved quality of life. Baseline:  Goal status: NEW  PLAN: PT FREQUENCY: 1-2x/week   PT DURATION: 6 weeks   PLANNED INTERVENTIONS: PLANNED INTERVENTIONS: Therapeutic exercises, Therapeutic activity, Neuromuscular re-education, Balance training, Gait training, Patient/Family education, Joint manipulation, Joint mobilization, Stair training, Orthotic/Fit training, DME instructions, Aquatic Therapy, Dry Needling, Electrical stimulation, Spinal manipulation, Spinal mobilization, Cryotherapy, Moist heat, Compression bandaging, scar mobilization, Splintting, Taping, Traction, Ultrasound, Ionotophoresis 62m/ml Dexamethasone, and Manual therapy     PLAN FOR NEXT SESSION: cervical and scapular strengthening. Continue manual and DN for mobility/pain. Continue extension based lumbar movements as indicated. Core and glute strength, postural strength   9:00 AM, 03/26/22 CJosue HectorPT DPT  Physical Therapist [facility] ([phone]

## 2022-03-28 ENCOUNTER — Ambulatory Visit (HOSPITAL_COMMUNITY): Payer: Medicare Other | Attending: Neurosurgery | Admitting: Physical Therapy

## 2022-03-28 DIAGNOSIS — R2689 Other abnormalities of gait and mobility: Secondary | ICD-10-CM | POA: Insufficient documentation

## 2022-03-28 DIAGNOSIS — M542 Cervicalgia: Secondary | ICD-10-CM | POA: Insufficient documentation

## 2022-03-28 DIAGNOSIS — M545 Low back pain, unspecified: Secondary | ICD-10-CM | POA: Insufficient documentation

## 2022-03-28 DIAGNOSIS — M5416 Radiculopathy, lumbar region: Secondary | ICD-10-CM | POA: Insufficient documentation

## 2022-03-28 NOTE — Therapy (Signed)
OUTPATIENT PHYSICAL THERAPY TREATMENT NOTE   Patient Name: Jared Griffin MRN: [id number] DOB:March 29, 1961, 61 y.o., male Today's Date: 03/28/2022    See note below for Objective Data and Assessment of Progress/Goals.      PCP: [name], MD REFERRING PROVIDER: Lorine Bears, NP   END OF SESSION:   PT End of Session - 03/28/22 0901     Visit Number 12    Number of Visits 21    Date for PT Re-Evaluation 04/05/22    Authorization Type [employer] Medicare (no auth, no VL)    Progress Note Due on Visit 32    PT Start Time 0902    PT Stop Time 0942    PT Time Calculation (min) 40 min    Activity Tolerance Patient tolerated treatment well    Behavior During Therapy [facility] for tasks assessed/performed              Past Medical History:  Diagnosis Date   Anxiety    COPD (chronic obstructive pulmonary disease) ([place])    Coronary atherosclerosis of native coronary artery    a. 12/09/2013: s/p PCI with 3.0 x 28 Promus DES to mLAD   Depression    Diabetic neuropathy ([place])    Elbow injury 07/25/2017   Surgery for torn tendon   Essential hypertension    Fatty liver disease, nonalcoholic    GERD (gastroesophageal reflux disease)    Iron deficiency anemia 08/15/2021   Lateral epicondylitis of right elbow    Mixed hyperlipidemia    Obesity    Osgood-Schlatter's disease of right knee    Skin cyst 10/03/2017   Removed from back   Type 2 diabetes mellitus ([place])    Past Surgical History:  Procedure Laterality Date   BIOPSY  01/21/2019   Procedure: BIOPSY;  Surgeon: [name], MD;  [facility];  Service: Endoscopy;;  gastric bx and gastric polyps   BIOPSY  11/08/2020   Procedure: BIOPSY;  Surgeon: [name], MD;  [facility];  Service: Gastroenterology;;   CARDIAC CATHETERIZATION  2011   CARDIAC CATHETERIZATION N/A 09/20/2015   Procedure: Left Heart Cath and Coronary Angiography;  Surgeon: [name], MD;  Location: [place] [facility];  Service: Cardiovascular;  Laterality: N/A;   CHOLECYSTECTOMY  2003   COLONOSCOPY  06/19/2012   Procedure: COLONOSCOPY;  Surgeon: [name], MD;  [facility];  Service: Endoscopy;  Laterality: N/A;  930   COLONOSCOPY N/A 10/17/2017   Procedure: COLONOSCOPY;  Surgeon: [name], MD;  [facility];  Service: Endoscopy;  Laterality: N/A;  1225   COLONOSCOPY WITH PROPOFOL N/A 11/08/2020   Procedure: COLONOSCOPY WITH PROPOFOL;  Surgeon: [name], MD;  [facility];  Service: Gastroenterology;  Laterality: N/A;  10:45   COLONOSCOPY WITH PROPOFOL N/A 04/04/2021   Procedure: COLONOSCOPY WITH PROPOFOL;  Surgeon: [name], MD;  [facility];  Service: Gastroenterology;  Laterality: N/A;   CYST EXCISION  07/2019   ESOPHAGOGASTRODUODENOSCOPY (EGD) WITH PROPOFOL N/A 01/21/2019   Procedure: ESOPHAGOGASTRODUODENOSCOPY (EGD) WITH PROPOFOL;  Surgeon: [name], MD;  [facility];  Service: Endoscopy;  Laterality: N/A;  10:15am   ESOPHAGOGASTRODUODENOSCOPY (EGD) WITH PROPOFOL N/A 02/17/2021   Procedure: ESOPHAGOGASTRODUODENOSCOPY (EGD) WITH PROPOFOL;  Surgeon: [name], MD;  [facility];  Service: Gastroenterology;  Laterality: N/A;  10:20   ESOPHAGOGASTRODUODENOSCOPY (EGD) WITH PROPOFOL N/A 04/04/2021  Procedure: ESOPHAGOGASTRODUODENOSCOPY (EGD) WITH PROPOFOL;  Surgeon: [name], MD;  [facility];  Service: Gastroenterology;  Laterality: N/A;  12:30   GIVENS CAPSULE STUDY N/A 09/13/2021   Procedure: GIVENS CAPSULE STUDY;  Surgeon: [name], MD;  [facility];  Service: Gastroenterology;  Laterality: N/A;  7:30   KNEE ARTHROPLASTY Right 1999   LEFT HEART CATH AND CORONARY ANGIOGRAPHY N/A 02/26/2018   Procedure: LEFT HEART CATH AND CORONARY ANGIOGRAPHY;  Surgeon: [name], MD;  Location: [place] [facility];  Service: Cardiovascular;  Laterality: N/A;   LEFT HEART CATHETERIZATION WITH CORONARY ANGIOGRAM N/A 12/09/2013   Procedure: LEFT HEART CATHETERIZATION WITH CORONARY ANGIOGRAM;  Surgeon: [name], MD;  Location: [facility] CATH LAB;  Service: Cardiovascular;  Laterality: N/A;   Liver biopsy     POLYPECTOMY  10/17/2017   Procedure: POLYPECTOMY;  Surgeon: [name], MD;  [facility];  Service: Endoscopy;;  ascending colon, splenic flexure,sigmoid x2   POLYPECTOMY  11/08/2020   Procedure: POLYPECTOMY;  Surgeon: [name], MD;  [facility];  Service: Gastroenterology;;   POLYPECTOMY  04/04/2021   Procedure: POLYPECTOMY;  Surgeon: [name], MD;  [facility];  Service: Gastroenterology;;   TENNIS ELBOW RELEASE/NIRSCHEL PROCEDURE Right 07/25/2017   Procedure: TENNIS ELBOW RELEASE and debriedment;  Surgeon: [name], MD;  Location: [facility];  Service: Orthopedics;  Laterality: Right;   TONSILLECTOMY  1970's   Patient Active Problem List   Diagnosis Date Noted   Atypical chest pain 03/06/2022   Diarrhea 12/14/2021   Iron deficiency anemia 08/15/2021   Orthostatic hypotension 12/07/2020   IBS (irritable bowel syndrome) 11/17/2020   Chronic diarrhea 08/17/2020   NASH (nonalcoholic steatohepatitis) 08/17/2020   Elevated LFTs 08/17/2020   Tremor of both hands 05/06/2020   Bilateral occipital neuralgia 05/06/2020   Abdominal pain, chronic, epigastric 01/13/2019   Anxiety 01/04/2019   Radicular pain of left lower extremity 12/22/2018   Moderate persistent asthma 10/25/2018   Dizziness 07/12/2018   Nonintractable headache    Abnormal nuclear stress test    Aftercare following surgery 07/25/17 08/13/2017   Hx of colonic polyps 08/07/2017   Lateral epicondylitis of right elbow    Cervical nerve root impingement 12/09/2015   Generalized anxiety disorder 09/27/2015   Pain in the chest    Depression 01/24/2015    Esophageal reflux 01/24/2015   Preoperative cardiovascular examination 06/28/2014   DM type 2 causing vascular disease ([place])    Mixed hyperlipidemia    Obesity    Intermediate coronary syndrome ([place]) 12/09/2013   Lumbago 05/21/2012   Essential hypertension, benign 12/28/2009   CAD S/P LAD DES March 2015 12/28/2009   Hyperlipidemia LDL goal <70 11/25/2009   Accelerating angina ([place]) 11/25/2009   DM2 (diabetes mellitus, type 2) ([place]) 11/24/2009   ABDOMINAL PAIN, HX OF 11/24/2009    REFERRING DIAG: M47.816 (ICD-10-CM) - Spondylosis without myelopathy or radiculopathy, lumbar region M54.50,G89.29 (ICD-10-CM) - Chronic bilateral low back pain without sciatica M47.816 (ICD-10-CM) - Facet hypertrophy of lumbar region M79.18 (ICD-10-CM) - Myofascial pain syndrome   THERAPY DIAG:  Low back pain, unspecified back pain laterality, unspecified chronicity, unspecified whether sciatica present  Radiculopathy, lumbar region  Cervicalgia  Other abnormalities of gait and mobility  PERTINENT HISTORY: Chronic back pain  PRECAUTIONS: None  SUBJECTIVE: Patient reports soreness in center of spine. Let up yesterday. Had a migraine the other day but feeling good today.  PAIN:  Are you having pain? No  OBJECTIVE:      POSTURE:  Forward head   PALPATION: Mod TTP about bilateral lumbar paraspinals    LUMBAR ROM:    Active  A/PROM  12/14/2021 AROM 12/26/21 AROM  01/01/22 AROM  01/25/22 AROM 02/22/22  Flexion 50% limited* 50% limited* 25% limited  10% limited  10% limited  Extension 50% limited* 50% limited * 50% limited * 30% limited 25% limited   Right lateral flexion 25% limited*    10% limited  Left lateral flexion 25% limited*    10% limited   Right rotation 25% limited      Left rotation 25% limited       (Blank rows = not tested)    Cervical AROM: Flexion 40 deg Extension 27 deg  RT rotation 25 deg LT rotation 40 deg    LE MMT:   MMT Right 12/14/2021 Right 01/25/2022 Right   02/22/22 Left 12/14/2021 Left 01/25/2022 Left 02/22/22  Hip flexion 4+/5 5/5 5 4+/5 5/5 5  Hip extension 4/5 4+/5 5 4/5 4+/5 5  Hip abduction 4/5 5/5 5 4+/5 4/5 4+  Hip adduction          Hip internal rotation          Hip external rotation          Knee flexion 5/5 5/5  5/5 5/5   Knee extension 5/5 5/5  5/5 5/5   Ankle dorsiflexion 5/5 5/5  5/5 5/5   Ankle plantarflexion          Ankle inversion          Ankle eversion           (Blank rows = not tested)   LUMBAR SPECIAL TESTS:  (+) Sacral compression    FUNCTIONAL TESTS:  2 minute walk test: 452 feet no AD    TODAY'S TREATMENT  03/28/22 Chin tuck 10 x 3" Band rows BTB 15 x 3" Shoulder extension BTB 15 x 3" Bilat shoulder ER BTB 15 x 3" Shoulder horizontal abduction BTB 15 x 3"  Pec stretch at door 3 x 20"  03/26/22 Chin tuck 15 x 5" Upper trap stretch 3 x 20" Levator stretch 3 x 20"  Scap retraction 15 x 5"  Cervical sidebend iso 10 x 5"    Trigger Point Dry-Needling  Treatment instructions: Expect mild to moderate muscle soreness. S/S of pneumothorax if dry needled over a lung field, and to seek immediate medical attention should they occur. Patient verbalized understanding of these instructions and education.  Patient Consent Given: Yes Education handout provided: Yes Muscles treated: Bilat cervical paraspinals  Electrical stimulation performed: No Parameters: N/A Treatment response/outcome: good tolerance    Manual STM to Bilat cervical paraspinals pre and post dry needling for trigger point identification and surface area preparation    02/27/22 UBE 3/3 FWD/ Back lv 1 Band rows GTB 2 x 10  Shoulder extension GTB 2 x 10 Band ER GTB 2 x 10 Seated UT stretch 3 x 20" Palloff press GTB 2 x 10   Manual STM to bilateral thoracic paraspinals pre and post dry needling for trigger point identification and surface area preparation   Trigger Point Dry-Needling  Treatment instructions: Expect mild to moderate muscle  soreness. S/S of pneumothorax if dry needled over a lung field, and to seek immediate medical attention should they occur. Patient verbalized understanding of these instructions and education.  Patient Consent Given: Yes Education handout provided: Previously provided Muscles  treated: bilateral thoracic paraspinals (2 left, 1 right) Electrical stimulation performed: No Parameters: N/A Treatment response/outcome: good tolerance      02/22/22 Reassess MMT AROM  FOTO: 48% function Chin tuck x5 Scap retraction x5 Cervical rotation x5  Manual STM to bilateral upper trap pre and post dry needling for trigger point identification and surface area preparation   Trigger Point Dry-Needling  Treatment instructions: Expect mild to moderate muscle soreness. S/S of pneumothorax if dry needled over a lung field, and to seek immediate medical attention should they occur. Patient verbalized understanding of these instructions and education.  Patient Consent Given: Yes Education handout provided: Previously provided Muscles treated: bilateral upper trap Electrical stimulation performed: No Parameters: N/A Treatment response/outcome: good tolerance, several twitches noted RT>LT   02/07/22  DRY NEEDLING: Dry needling consent given? yes Educational handouts provided? Previously given  Muscles treated: Lower R lumbar multifidi  Response from dry needling: good tolerance   Manual STM to bilateral lumbar paraspinals pre and post dry needling for trigger point identification and surface area preparation   DKTC with heels on green ball 10 x 5 second holds; 2 sets Prone hip extension 2x 10 bilateral     PATIENT EDUCATION:  Education details: exercise form and function Person educated: Patient Education method: Explanation, Demonstration, and Handouts Education comprehension: verbalized understanding, returned demonstration, verbal cues required, and tactile cues required       HOME EXERCISE  PROGRAM: 01/11/22 Ab march SLR [place]   Access Code: [email or web address] URL: [email or web address]  03/28/22 - Seated Cervical Retraction  - 2 x daily - 7 x weekly - 1 sets - 10 reps - 3 second hold - Seated Scapular Retraction  - 2 x daily - 7 x weekly - 1 sets - 10 reps - 3 second hold - Shoulder External Rotation and Scapular Retraction with Resistance  - 2 x daily - 7 x weekly - 1 sets - 10 reps - 3 second hold - Standing Shoulder Row with Anchored Resistance  - 2 x daily - 7 x weekly - 2 sets - 10 reps - 3 second hold - Shoulder extension with resistance - Neutral  - 2 x daily - 7 x weekly - 2 sets - 10 reps - 3 second hold - Standing Shoulder Horizontal Abduction with Resistance  - 2 x daily - 7 x weekly - 2 sets - 10 reps - 3 second hold - Doorway Pec Stretch at 90 Degrees Abduction  - 2 x daily - 7 x weekly - 1 sets - 4 reps - 20 second hold - Seated Thoracic Lumbar Extension with Pectoralis Stretch  - 2 x daily - 7 x weekly - 1 sets - 10 reps - 3 second hold  Date: 01/01/2022 Prepared by: Josue Hector  Exercises - Sidelying Thoracic and Shoulder Rotation  - 2-3 x daily - 7 x weekly - 1 sets - 10 reps - 5-10 second hold - Supine Piriformis Stretch with Foot on Ground  - 2-3 x daily - 7 x weekly - 1 sets - 3 reps - 20-30 second holdExercises - Seated Thoracic Lumbar Extension with Pectoralis Stretch  - 1 x daily - 7 x weekly - 1 sets - 10 reps - Shoulder Extension with Resistance  - 1 x daily - 7 x weekly - 3 sets - 10 reps - Scapular Retraction with Resistance  - 1 x daily - 7 x weekly - 3 sets - 10 reps - Seated Hamstring Stretch with Strap  - 1  x daily - 7 x weekly - 1 sets - 3 reps - 30 sec hold - Slant Board Gastrocnemius Stretch  - 1 x daily - 7 x weekly - 1 sets - 3 reps - 30 sec hold   Exercises - Supine Bridge  - 2 x daily - 7 x weekly - 1-2 sets - 10 reps - 5 second hold - Supine March  - 2 x daily - 7 x weekly - 1-2 sets - 10 reps - Supine Lower Trunk  Rotation  - 2 x daily - 7 x weekly - 1 sets - 10 reps - 5 second hold3/30/23 press up  02/22/22 Chin tuck  Scap retraction  Cervical rotation    ASSESSMENT:   CLINICAL IMPRESSION: Patient continues to have neck pain related to postural deficits. Progresses postural strengthening with added band exercises. Increased resistance ot blue band. Also introduced pec stretching in door and seated thoracic extension for improved posturing. Updated and issued HEP handout. Patient will continue to benefit from skilled therapy services to reduce remaining deficits and improve functional ability.    OBJECTIVE IMPAIRMENTS Abnormal gait, decreased activity tolerance, decreased endurance, decreased mobility, difficulty walking, decreased ROM, decreased strength, hypomobility, increased muscle spasms, impaired flexibility, improper body mechanics, postural dysfunction, and pain.    ACTIVITY LIMITATIONS cleaning, community activity, driving, meal prep, laundry, yard work, shopping, and yard work.    PERSONAL FACTORS Fitness, Past/current experiences, Time since onset of injury/illness/exacerbation, and 3+ comorbidities: chronic back pain, DM, increased BMI, cardiac   are also affecting patient's functional outcome.      REHAB POTENTIAL: Good   CLINICAL DECISION MAKING: Stable/uncomplicated   EVALUATION COMPLEXITY: Low     GOALS: Goals reviewed with patient? Yes   SHORT TERM GOALS: Target date: 01/04/2022   Patient will be independent with HEP in order to improve functional outcomes. Baseline:  Goal status: MET    2.  Patient will report at least 25% improvement in symptoms for improved quality of life. Baseline: Reports 60% Goal status: MET       LONG TERM GOALS: Target date: 01/25/2022   Patient will report at least 75% improvement in (lumbar) symptoms for improved quality of life. Baseline: Reports 75%  Goal status: MET (modified)   2.  Patient will improve FOTO score by at least 10  points in order to indicate improved tolerance to activity. Baseline: Improved 5%  Goal status: Ongoing   3.  Patient will demonstrate at least 25% improvement in lumbar ROM in all restricted planes for improved ability to move trunk while completing chores. Baseline: See AROM  Goal status: Partially MET   4.  Patient will be able to ambulate at least 450 feet in 2MWT with pain no greater than 2/10 in order to demonstrate improved tolerance to activity. Baseline: Distance no issue but baseline 5/10 pain  Goal status: Partially MET  5. Patient improve RT cervical rotation by at least 10 degrees in order to improve ability to scan environment for safety and while driving. Baseline: 25 degrees Goal status: NEW  6.  Patient will report at least 75% improvement in (cervical) symptoms for improved quality of life. Baseline:  Goal status: NEW  PLAN: PT FREQUENCY: 1-2x/week   PT DURATION: 6 weeks   PLANNED INTERVENTIONS: PLANNED INTERVENTIONS: Therapeutic exercises, Therapeutic activity, Neuromuscular re-education, Balance training, Gait training, Patient/Family education, Joint manipulation, Joint mobilization, Stair training, Orthotic/Fit training, DME instructions, Aquatic Therapy, Dry Needling, Electrical stimulation, Spinal manipulation, Spinal mobilization, Cryotherapy, Moist heat, Compression  bandaging, scar mobilization, Splintting, Taping, Traction, Ultrasound, Ionotophoresis 32m/ml Dexamethasone, and Manual therapy     PLAN FOR NEXT SESSION: cervical and scapular strengthening. Continue manual and DN for mobility/pain. Continue extension based lumbar movements as indicated. Core and glute strength, postural strength   9:39 AM, 03/28/22 CJosue HectorPT DPT  Physical Therapist [facility] ([phone]

## 2022-04-01 NOTE — Progress Notes (Unsigned)
Cardiology Office Note   Date:  04/02/2022   ID:  Jared Griffin, DOB [birth year], MRN [id number]  PCP:  [name], MD  Cardiologist:  Dr. [name]    Chief Complaint  Patient presents with   Chest Pain      History of Present Illness: Jared Griffin is a 61 y.o. male who presents for post hospital and on going chest pain.    hx of CAD with history of mLAD PCI in 11/2013, HTN, HLD, DMII, COPD, anxiety and depression  with prior PCI to mLAD in 2015 who follows with Dr. [name]. Last cath in 2017 showed mild-to-moderate mLAD stenosis beyond the stent with no significant in-stent restenosis. Last TTE 07/2020 with LVEF 60-65%, no WMA, normal RV, no significant valve disease.   Admitted 03/06/22 with chest pain neg troponin and EKG with no acute ischemic changes. He had nuc study with low risk for ischemia  Echo with EF 60-65%, no RWMA, mild concentric LVH.  G1DD.  There is mild dilatation of the aortic root, measuring 42 mm. There is mild dilatation of the ascending aorta,  measuring 41 mm.   Today he continues with chest pain.  Begins mild ache and progresses to more significant ache.  He did take NTG with relief a few days prior to ER visit.   He stated it does not feel like his GI pain.  On review of CTA of chest it was noted marked severity of coronary artery calcification.  Compared to CTA of chest done 03/14/21  Coronary atherosclerosis and LAD stent are noted.      Past Medical History:  Diagnosis Date   Anxiety    COPD (chronic obstructive pulmonary disease) ([place])    Coronary atherosclerosis of native coronary artery    a. 12/09/2013: s/p PCI with 3.0 x 28 Promus DES to mLAD   Depression    Diabetic neuropathy ([place])    Elbow injury 07/25/2017   Surgery for torn tendon   Essential hypertension    Fatty liver disease, nonalcoholic    GERD (gastroesophageal reflux disease)    Iron deficiency anemia 08/15/2021   Lateral epicondylitis of right elbow    Mixed  hyperlipidemia    Obesity    Osgood-Schlatter's disease of right knee    Skin cyst 10/03/2017   Removed from back   Type 2 diabetes mellitus ([place])     Past Surgical History:  Procedure Laterality Date   BIOPSY  01/21/2019   Procedure: BIOPSY;  Surgeon: [name], MD;  [facility];  Service: Endoscopy;;  gastric bx and gastric polyps   BIOPSY  11/08/2020   Procedure: BIOPSY;  Surgeon: [name], MD;  [facility];  Service: Gastroenterology;;   CARDIAC CATHETERIZATION  2011   CARDIAC CATHETERIZATION N/A 09/20/2015   Procedure: Left Heart Cath and Coronary Angiography;  Surgeon: [name], MD;  Location: [facility];  Service: Cardiovascular;  Laterality: N/A;   CHOLECYSTECTOMY  2003   COLONOSCOPY  06/19/2012   Procedure: COLONOSCOPY;  Surgeon: [name], MD;  [facility];  Service: Endoscopy;  Laterality: N/A;  930   COLONOSCOPY N/A 10/17/2017   Procedure: COLONOSCOPY;  Surgeon: [name], MD;  [facility];  Service: Endoscopy;  Laterality: N/A;  1225   COLONOSCOPY WITH PROPOFOL N/A 11/08/2020   Procedure: COLONOSCOPY WITH PROPOFOL;  Surgeon: [name], MD;  [facility];  Service: Gastroenterology;  Laterality:  N/A;  10:45   COLONOSCOPY WITH PROPOFOL N/A 04/04/2021   Procedure: COLONOSCOPY WITH PROPOFOL;  Surgeon: [name], MD;  [facility];  Service: Gastroenterology;  Laterality: N/A;   CYST EXCISION  07/2019   ESOPHAGOGASTRODUODENOSCOPY (EGD) WITH PROPOFOL N/A 01/21/2019   Procedure: ESOPHAGOGASTRODUODENOSCOPY (EGD) WITH PROPOFOL;  Surgeon: [name], MD;  [facility];  Service: Endoscopy;  Laterality: N/A;  10:15am   ESOPHAGOGASTRODUODENOSCOPY (EGD) WITH PROPOFOL N/A 02/17/2021   Procedure: ESOPHAGOGASTRODUODENOSCOPY (EGD) WITH PROPOFOL;  Surgeon: [name], MD;  [facility];  Service:  Gastroenterology;  Laterality: N/A;  10:20   ESOPHAGOGASTRODUODENOSCOPY (EGD) WITH PROPOFOL N/A 04/04/2021   Procedure: ESOPHAGOGASTRODUODENOSCOPY (EGD) WITH PROPOFOL;  Surgeon: [name], MD;  [facility];  Service: Gastroenterology;  Laterality: N/A;  12:30   GIVENS CAPSULE STUDY N/A 09/13/2021   Procedure: GIVENS CAPSULE STUDY;  Surgeon: [name], MD;  [facility];  Service: Gastroenterology;  Laterality: N/A;  7:30   KNEE ARTHROPLASTY Right 1999   LEFT HEART CATH AND CORONARY ANGIOGRAPHY N/A 02/26/2018   Procedure: LEFT HEART CATH AND CORONARY ANGIOGRAPHY;  Surgeon: [name], MD;  Location: [facility];  Service: Cardiovascular;  Laterality: N/A;   LEFT HEART CATHETERIZATION WITH CORONARY ANGIOGRAM N/A 12/09/2013   Procedure: LEFT HEART CATHETERIZATION WITH CORONARY ANGIOGRAM;  Surgeon: [name], MD;  Location: [facility];  Service: Cardiovascular;  Laterality: N/A;   Liver biopsy     POLYPECTOMY  10/17/2017   Procedure: POLYPECTOMY;  Surgeon: [name], MD;  [facility];  Service: Endoscopy;;  ascending colon, splenic flexure,sigmoid x2   POLYPECTOMY  11/08/2020   Procedure: POLYPECTOMY;  Surgeon: [name], MD;  [facility];  Service: Gastroenterology;;   POLYPECTOMY  04/04/2021   Procedure: POLYPECTOMY;  Surgeon: [name], MD;  [facility];  Service: Gastroenterology;;   TENNIS ELBOW RELEASE/NIRSCHEL PROCEDURE Right 07/25/2017   Procedure: TENNIS ELBOW RELEASE and debriedment;  Surgeon: [name], MD;  Location: [facility];  Service: Orthopedics;  Laterality: Right;   TONSILLECTOMY  1970's     Current Outpatient Medications  Medication Sig Dispense Refill   acetaminophen (TYLENOL) 500 MG tablet Take 1,000 mg by mouth every 8 (eight) hours as needed for moderate pain.     albuterol (VENTOLIN HFA) 108 (90 Base) MCG/ACT  inhaler INHALE 2 PUFFS INTO LUNGS EVERY 6 HOURS AS NEEDED FOR WHEEZING AND SHORTNESS OF BREATH. (Patient taking differently: Inhale 2 puffs into the lungs every 6 (six) hours as needed for shortness of breath or wheezing. INHALE 2 PUFFS INTO LUNGS EVERY 6 HOURS AS NEEDED FOR WHEEZING AND SHORTNESS OF BREATH.) 8.5 g 5   ALPRAZolam (XANAX) 1 MG tablet Take 1 tablet (1 mg total) by mouth 3 (three) times daily as needed for anxiety. 90 tablet 3   aspirin 81 MG EC tablet Take 1 tablet (81 mg total) by mouth daily. 30 tablet 12   atorvastatin (LIPITOR) 40 MG tablet TAKE ONE TABLET (40MG TOTAL) BY MOUTH DAILY (Patient taking differently: Take 40 mg by mouth daily.) 90 tablet 1   B-D UF III MINI PEN NEEDLES 31G X 5 MM MISC USE UP TO 4 TIMES DAILY WITH INSULIN AS DIRECTED. 100 each 3   cetirizine (ZYRTEC) 10 MG tablet Take 1 tablet (10 mg total) by mouth daily. 30 tablet 0   colestipol (COLESTID) 1 g tablet Take 2 tablets (  2 g total) by mouth daily. Take 4 hours apart from other medication (Patient taking differently: Take 2 g by mouth daily. Take 4 hours apart from other medication prn) 180 tablet 3   Continuous Blood Gluc Sensor (FREESTYLE LIBRE 14 DAY SENSOR) MISC Inject 1 each into the skin every 14 (fourteen) days. Use as directed. 2 each 2   dapagliflozin propanediol (FARXIGA) 5 MG TABS tablet Take 5 mg by mouth daily.     Doxylamine-Pyridoxine 10-10 MG TBEC Take 1 each by mouth every 12 (twelve) hours as needed (nausea). 60 tablet 3   ezetimibe (ZETIA) 10 MG tablet Take 1 tablet (10 mg total) by mouth daily. 30 tablet 5   fluticasone (FLONASE) 50 MCG/ACT nasal spray Place 2 sprays into both nostrils daily. (Patient taking differently: Place 2 sprays into both nostrils daily as needed for allergies.) 16 g 0   fluticasone-salmeterol (ADVAIR HFA) 115-21 MCG/ACT inhaler INHALE 2 PUFFS INTO THE LUNGS TWICE DAILY 12 g 1   gabapentin (NEURONTIN) 300 MG capsule 2 qam and 2qevening 120 capsule 6   HUMALOG KWIKPEN  200 UNIT/ML KwikPen Inject 30-35 Units into the skin 3 (three) times daily.     isosorbide mononitrate (IMDUR) 30 MG 24 hr tablet Take 1 tablet (30 mg total) by mouth daily. 90 tablet 3   Lancets MISC 1 each by Does not apply route 4 (four) times daily. 150 each 5   losartan (COZAAR) 25 MG tablet TAKE ONE (1) TABLET BY MOUTH EVERY DAY 90 tablet 3   metFORMIN (GLUCOPHAGE) 500 MG tablet Take 1,000 mg by mouth 2 (two) times daily with a meal.     methocarbamol (ROBAXIN) 500 MG tablet Take 500 mg by mouth every 8 (eight) hours as needed for muscle spasms.     nitroGLYCERIN (NITROSTAT) 0.4 MG SL tablet Place 1 tablet (0.4 mg total) under the tongue every 5 (five) minutes as needed. 25 tablet 3   omeprazole (PRILOSEC) 40 MG capsule Take 1 capsule (40 mg total) by mouth daily. 90 capsule 3   ondansetron (ZOFRAN-ODT) 8 MG disintegrating tablet TAKE ONE TABLET (8MG TOTAL) BY MOUTH EVERY 8 HOURS AS NEEDED FRO NAUSEA OR VOMITING (Patient taking differently: Take 8 mg by mouth every 8 (eight) hours as needed for nausea or vomiting.) 30 tablet 5   propranolol (INDERAL) 20 MG tablet Take 1 tablet (20 mg total) by mouth 2 (two) times daily. (Patient taking differently: Take 20 mg by mouth daily.) 180 tablet 3   ranolazine (RANEXA) 1000 MG SR tablet TAKE ONE TABLET (1000MG TOTAL) BY MOUTH TWO TIMES DAILY (Patient taking differently: Take 1,000 mg by mouth 2 (two) times daily.) 180 tablet 1   Semaglutide,0.25 or 0.5MG/DOS, (OZEMPIC, 0.25 OR 0.5 MG/DOSE,) 2 MG/3ML SOPN See admin instructions.     sertraline (ZOLOFT) 100 MG tablet Take 1.5 tablets (150 mg total) by mouth daily. 135 tablet 3   TOUJEO MAX SOLOSTAR 300 UNIT/ML Solostar Pen INJECT 130 UNITS INTO THE SKIN AT BEDTIME. (Patient taking differently: Inject 40-50 Units into the skin at bedtime.) 12 mL 0   traZODone (DESYREL) 50 MG tablet Take 1 tablet (50 mg total) by mouth at bedtime as needed. 30 tablet 2   Semaglutide,0.25 or 0.5MG/DOS, (OZEMPIC, 0.25 OR 0.5  MG/DOSE,) 2 MG/3ML SOPN 0.5 mg     No current facility-administered medications for this visit.    Allergies:   Doxycycline    Social History:  The patient  reports that he quit smoking about 38 years  ago. His smoking use included cigarettes. He started smoking about 49 years ago. He has a 5.00 pack-year smoking history. He quit smokeless tobacco use about 38 years ago.  His smokeless tobacco use included chew. He reports that he does not drink alcohol and does not use drugs.   Family History:  The patient's family history includes CVA in his maternal grandfather and maternal grandmother; Cancer in his maternal aunt; Healthy in his son; Osteoporosis in his mother.    ROS:  General:no colds or fevers, no weight changes Skin:no rashes or ulcers HEENT:no blurred vision, no congestion CV:see HPI PUL:see HPI GI:no diarrhea constipation or melena, no indigestion GU:no hematuria, no dysuria MS:no joint pain, no claudication Neuro:no syncope, no lightheadedness Endo:+ diabetes, no thyroid disease  Wt Readings from Last 3 Encounters:  04/02/22 268 lb 6.4 oz (121.7 kg)  03/06/22 263 lb 0.1 oz (119.3 kg)  02/07/22 267 lb (121.1 kg)     PHYSICAL EXAM: VS:  BP 130/82   Pulse 69   Ht 6' 2"  (1.88 m)   Wt 268 lb 6.4 oz (121.7 kg)   SpO2 93%   BMI 34.46 kg/m  , BMI Body mass index is 34.46 kg/m. General:Pleasant affect, NAD Skin:Warm and dry, brisk capillary refill HEENT:normocephalic, sclera clear, mucus membranes moist Neck:supple, no JVD, no bruits  Heart:S1S2 RRR without murmur, gallup, rub or click Lungs:clear without rales, rhonchi, or wheezes UUV:OZDG, non tender, + BS, do not palpate liver spleen or masses Ext:no lower ext edema, 2+ pedal pulses, 2+ radial pulses Neuro:alert and oriented X 3, MAE, follows commands, + facial symmetry    EKG:  EKG is NOT ordered today. The ekg ordered today demonstrates EKG from 03/07/22 reviewed and SR with no acute ST changes.     Recent  Labs: 02/07/2022: ALT 59 03/06/2022: Hemoglobin 16.6; Platelets 171 03/08/2022: BUN 16; Creatinine, Ser 1.09; Potassium 3.8; Sodium 136    Lipid Panel    Component Value Date/Time   CHOL 194 02/07/2022 0938   TRIG 372 (H) 02/07/2022 0938   HDL 38 (L) 02/07/2022 0938   CHOLHDL 5.1 (H) 02/07/2022 0938   CHOLHDL 6.4 07/12/2018 0713   VLDL UNABLE TO CALCULATE IF TRIGLYCERIDE OVER 400 mg/dL 07/12/2018 0713   LDLCALC 94 02/07/2022 0938       Other studies Reviewed: Additional studies/ records that were reviewed today include: .  Nuc study 03/07/22 Lexiscan Myoview showed no evidence of ischemia and was a low-risk study.   TTE 03/07/22 IMPRESSIONS     1. Left ventricular ejection fraction, by estimation, is 60 to 65%. The  left ventricle has normal function. The left ventricle has no regional  wall motion abnormalities. There is mild concentric left ventricular  hypertrophy. Left ventricular diastolic  parameters are consistent with Grade I diastolic dysfunction (impaired  relaxation).   2. Right ventricular systolic function is normal. The right ventricular  size is normal. There is normal pulmonary artery systolic pressure. The  estimated right ventricular systolic pressure is 64.4 mmHg.   3. The mitral valve is normal in structure. No evidence of mitral valve  regurgitation. No evidence of mitral stenosis.   4. The aortic valve is tricuspid. Aortic valve regurgitation is not  visualized. No aortic stenosis is present.   5. Aortic dilatation noted. There is mild dilatation of the aortic root,  measuring 42 mm. There is mild dilatation of the ascending aorta,  measuring 41 mm.   Comparison(s): Aorta is mildly dilated; recommened one year repeat imaging  if clinically indicated.   FINDINGS   Left Ventricle: Left ventricular ejection fraction, by estimation, is 60  to 65%. The left ventricle has normal function. The left ventricle has no  regional wall motion abnormalities.  The left ventricular internal cavity  size was normal in size. There is   mild concentric left ventricular hypertrophy. Left ventricular diastolic  parameters are consistent with Grade I diastolic dysfunction (impaired  relaxation).   Right Ventricle: The right ventricular size is normal. No increase in  right ventricular wall thickness. Right ventricular systolic function is  normal. There is normal pulmonary artery systolic pressure. The tricuspid  regurgitant velocity is 2.22 m/s, and   with an assumed right atrial pressure of 3 mmHg, the estimated right  ventricular systolic pressure is 71.5 mmHg.   Left Atrium: Left atrial size was normal in size.   Right Atrium: Right atrial size was normal in size.   Pericardium: Trivial pericardial effusion is present. Presence of  epicardial fat layer.   Mitral Valve: The mitral valve is normal in structure. No evidence of  mitral valve regurgitation. No evidence of mitral valve stenosis. MV peak  gradient, 3.2 mmHg. The mean mitral valve gradient is 1.0 mmHg.   Tricuspid Valve: The tricuspid valve is normal in structure. Tricuspid  valve regurgitation is not demonstrated. No evidence of tricuspid  stenosis.   Aortic Valve: The aortic valve is tricuspid. Aortic valve regurgitation is  not visualized. No aortic stenosis is present. Aortic valve mean gradient  measures 2.0 mmHg. Aortic valve peak gradient measures 4.8 mmHg. Aortic  valve area, by VTI measures 3.85  cm.   Pulmonic Valve: The pulmonic valve was normal in structure. Pulmonic valve  regurgitation is not visualized. No evidence of pulmonic stenosis.   Aorta: Aortic dilatation noted. There is mild dilatation of the aortic  root, measuring 42 mm. There is mild dilatation of the ascending aorta,  measuring 41 mm.   IAS/Shunts: No atrial level shunt detected by color flow Doppler.   Cath 2017: 1. Patent stent mid LAD with no restenosis.  2. Mild to moderate mid LAD  stenosis beyond the stent, does not appear to be flow limiting.  3. No obstructive disease in the Circumflex or RCA 4. Normal LV systolic function   Recommendations: Continue medical management of CAD. Agree with starting PPI for possible GERD. Will check D-dimer to exclude PE.    TTE 07/2020: IMPRESSIONS     1. Left ventricular ejection fraction, by estimation, is 60 to 65%. The  left ventricle has normal function. The left ventricle has no regional  wall motion abnormalities. There is mild left ventricular hypertrophy.  Left ventricular diastolic parameters  were normal.   2. Right ventricular systolic function is normal. The right ventricular  size is normal.   3. Left atrial size was mildly dilated.   4. The mitral valve is normal in structure. Trivial mitral valve  regurgitation. No evidence of mitral stenosis.   5. The aortic valve is normal in structure. Aortic valve regurgitation is  not visualized. No aortic stenosis is present.   6. The inferior vena cava is normal in size with greater than 50%  respiratory variability, suggesting right atrial pressure of 3 mmHg.     ASSESSMENT AND PLAN:  1.  Ongoing angina on ranexa and propranolol.  NTG sl with relief.  Neg nuc study and neg. MI and no acute EKG changes though angina continues.  Will add Imdur 30 mg to see if  this will help and he will stop viagra.  He rarely takes anyway.  Discussed possible cardiac cath to further eval last cath was 4 years ago and stent to LAD that was placed 2015 was patent.  The coronary calcification is worrisome since it appears to have increased.   Will discuss with Dr. [name] possibility of cath.  2.  CAD with hx ot stent to LAD - some of the ache reminds him of discomfort prior to stent. No nausea, some diaphoresis and some SOB in association.  On BB, ARB and now nitrate in addition to ranexa. Along with ASA  3.  HLD with elevated TG 01/2022 and LDL 94 on zetia  goal <70  4.  DM-2 per PCP    The patient understands that risks included but are not limited to stroke (1 in 1000), death (1 in 5), kidney failure [usually temporary] (1 in 500), bleeding (1 in 200), allergic reaction [possibly serious] (1 in 200).       Current medicines are reviewed with the patient today.  The patient Has no concerns regarding medicines.  The following changes have been made:  See above Labs/ tests ordered today include:see above  Disposition:   FU:  see above  Signed, Cecilie Kicks, NP  04/02/2022 4:15 PM    [facility] [place], [address] [place], [place] Phone: [phone]; Fax: [phone]

## 2022-04-02 ENCOUNTER — Ambulatory Visit: Payer: Medicare Other | Admitting: Cardiology

## 2022-04-02 ENCOUNTER — Encounter: Payer: Self-pay | Admitting: Cardiology

## 2022-04-02 VITALS — BP 130/82 | HR 69 | Ht 74.0 in | Wt 268.4 lb

## 2022-04-02 DIAGNOSIS — I25119 Atherosclerotic heart disease of native coronary artery with unspecified angina pectoris: Secondary | ICD-10-CM

## 2022-04-02 DIAGNOSIS — E785 Hyperlipidemia, unspecified: Secondary | ICD-10-CM | POA: Diagnosis not present

## 2022-04-02 DIAGNOSIS — I200 Unstable angina: Secondary | ICD-10-CM | POA: Diagnosis not present

## 2022-04-02 MED ORDER — ISOSORBIDE MONONITRATE ER 30 MG PO TB24: 30.0000 mg | ORAL_TABLET | Freq: Every day | ORAL | 3 refills | Status: DC

## 2022-04-02 NOTE — Patient Instructions (Signed)
Medication Instructions:   Start Taking Imdur 30 mg Daily  Stop Taking Viagra   *If you need a refill on your cardiac medications before your next appointment, please call your pharmacy*   Lab Work: NONE   If you have labs (blood work) drawn today and your tests are completely normal, you will receive your results only by: [place] (if you have MyChart) OR A paper copy in the mail If you have any lab test that is abnormal or we need to change your treatment, we will call you to review the results.   Testing/Procedures: NONE    Follow-Up: At [facility], you and your health needs are our priority.  As part of our continuing mission to provide you with exceptional heart care, we have created designated Provider Care Teams.  These Care Teams include your primary Cardiologist (physician) and Advanced Practice Providers (APPs -  Physician Assistants and Nurse Practitioners) who all work together to provide you with the care you need, when you need it.  We recommend signing up for the patient portal called "MyChart".  Sign up information is provided on this After Visit Summary.  MyChart is used to connect with patients for Virtual Visits (Telemedicine).  Patients are able to view lab/test results, encounter notes, upcoming appointments, etc.  Non-urgent messages can be sent to your provider as well.   To learn more about what you can do with MyChart, go to [email or web address].    Your next appointment:   3 week(s)  The format for your next appointment:   In Person  Provider:   You may see Rozann Lesches, MD or one of the following Advanced Practice Providers on your designated Care Team:   Bernerd Pho, PA-C  Ermalinda Barrios, PA-C     Other Instructions Thank you for choosing [facility]!    Important Information About Sugar

## 2022-04-03 ENCOUNTER — Ambulatory Visit (HOSPITAL_COMMUNITY): Payer: Medicare Other | Admitting: Physical Therapy

## 2022-04-03 ENCOUNTER — Telehealth: Payer: Self-pay | Admitting: Cardiology

## 2022-04-03 ENCOUNTER — Encounter (HOSPITAL_COMMUNITY): Payer: Self-pay | Admitting: Physical Therapy

## 2022-04-03 ENCOUNTER — Encounter: Payer: Self-pay | Admitting: *Deleted

## 2022-04-03 DIAGNOSIS — I200 Unstable angina: Secondary | ICD-10-CM

## 2022-04-03 DIAGNOSIS — R2689 Other abnormalities of gait and mobility: Secondary | ICD-10-CM

## 2022-04-03 DIAGNOSIS — M545 Low back pain, unspecified: Secondary | ICD-10-CM | POA: Diagnosis not present

## 2022-04-03 DIAGNOSIS — M5416 Radiculopathy, lumbar region: Secondary | ICD-10-CM

## 2022-04-03 DIAGNOSIS — R29898 Other symptoms and signs involving the musculoskeletal system: Secondary | ICD-10-CM

## 2022-04-03 DIAGNOSIS — M542 Cervicalgia: Secondary | ICD-10-CM

## 2022-04-03 DIAGNOSIS — M6281 Muscle weakness (generalized): Secondary | ICD-10-CM

## 2022-04-03 NOTE — Addendum Note (Signed)
Addended by: Levonne Hubert on: 04/03/2022 03:24 PM   Modules accepted: Orders

## 2022-04-03 NOTE — Telephone Encounter (Signed)
I tried to reach pt to plan for cardiac cath.  I left a message for him to call.  He knew that cardiac cath was mont likely plan.    Karen Kays can you arrange for cardiac cath this week or next when pt prefers.    Thanks Mickel Baas, Just let me know.

## 2022-04-03 NOTE — Therapy (Signed)
OUTPATIENT PHYSICAL THERAPY TREATMENT NOTE   Patient Name: Jared Griffin MRN: [id number] DOB:March 12, 1961, 61 y.o., male Today's Date: 04/03/2022   PHYSICAL THERAPY DISCHARGE SUMMARY  Visits from Start of Care: 13  Current functional level related to goals / functional outcomes: See below   Remaining deficits: See below   Education / Equipment: See below   Patient agrees to discharge. Patient goals were met. Patient is being discharged due to meeting the stated rehab goals.   PCP: [name], MD REFERRING PROVIDER: Lorine Bears, NP   END OF SESSION:   PT End of Session - 04/03/22 0816     Visit Number 13    Number of Visits 21    Date for PT Re-Evaluation 04/05/22    Authorization Type U[employer] Medicare (no auth, no VL)    Progress Note Due on Visit 30    PT Start Time 0817    PT Stop Time 0848    PT Time Calculation (min) 31 min    Activity Tolerance Patient tolerated treatment well    Behavior During Therapy [facility] for tasks assessed/performed              Past Medical History:  Diagnosis Date   Anxiety    COPD (chronic obstructive pulmonary disease) ([place])    Coronary atherosclerosis of native coronary artery    a. 12/09/2013: s/p PCI with 3.0 x 28 Promus DES to mLAD   Depression    Diabetic neuropathy ([place])    Elbow injury 07/25/2017   Surgery for torn tendon   Essential hypertension    Fatty liver disease, nonalcoholic    GERD (gastroesophageal reflux disease)    Iron deficiency anemia 08/15/2021   Lateral epicondylitis of right elbow    Mixed hyperlipidemia    Obesity    Osgood-Schlatter's disease of right knee    Skin cyst 10/03/2017   Removed from back   Type 2 diabetes mellitus ([place])    Past Surgical History:  Procedure Laterality Date   BIOPSY  01/21/2019   Procedure: BIOPSY;  Surgeon: [name], MD;  [facility];  Service: Endoscopy;;  gastric bx and gastric polyps   BIOPSY  11/08/2020   Procedure: BIOPSY;   Surgeon: [name], MD;  [facility];  Service: Gastroenterology;;   CARDIAC CATHETERIZATION  2011   CARDIAC CATHETERIZATION N/A 09/20/2015   Procedure: Left Heart Cath and Coronary Angiography;  Surgeon: [name], MD;  Location: [place] [facility];  Service: Cardiovascular;  Laterality: N/A;   CHOLECYSTECTOMY  2003   COLONOSCOPY  06/19/2012   Procedure: COLONOSCOPY;  Surgeon: [name], MD;  [facility];  Service: Endoscopy;  Laterality: N/A;  930   COLONOSCOPY N/A 10/17/2017   Procedure: COLONOSCOPY;  Surgeon: [name], MD;  [facility];  Service: Endoscopy;  Laterality: N/A;  1225   COLONOSCOPY WITH PROPOFOL N/A 11/08/2020   Procedure: COLONOSCOPY WITH PROPOFOL;  Surgeon: [name], MD;  [facility];  Service: Gastroenterology;  Laterality: N/A;  10:45   COLONOSCOPY WITH PROPOFOL N/A 04/04/2021   Procedure: COLONOSCOPY WITH PROPOFOL;  Surgeon: [name], MD;  [facility];  Service: Gastroenterology;  Laterality: N/A;   CYST EXCISION  07/2019   ESOPHAGOGASTRODUODENOSCOPY (EGD) WITH PROPOFOL N/A 01/21/2019   Procedure: ESOPHAGOGASTRODUODENOSCOPY (EGD) WITH PROPOFOL;  Surgeon: [name], MD;  [facility];  Service: Endoscopy;  Laterality: N/A;  10:15am  ESOPHAGOGASTRODUODENOSCOPY (EGD) WITH PROPOFOL N/A 02/17/2021   Procedure: ESOPHAGOGASTRODUODENOSCOPY (EGD) WITH PROPOFOL;  Surgeon: [name], MD;  [facility];  Service: Gastroenterology;  Laterality: N/A;  10:20   ESOPHAGOGASTRODUODENOSCOPY (EGD) WITH PROPOFOL N/A 04/04/2021   Procedure: ESOPHAGOGASTRODUODENOSCOPY (EGD) WITH PROPOFOL;  Surgeon: [name], MD;  [facility];  Service: Gastroenterology;  Laterality: N/A;  12:30   GIVENS CAPSULE STUDY N/A 09/13/2021   Procedure: GIVENS CAPSULE STUDY;  Surgeon: [name], MD;   [facility];  Service: Gastroenterology;  Laterality: N/A;  7:30   KNEE ARTHROPLASTY Right 1999   LEFT HEART CATH AND CORONARY ANGIOGRAPHY N/A 02/26/2018   Procedure: LEFT HEART CATH AND CORONARY ANGIOGRAPHY;  Surgeon: [name], MD;  Location: [place] [facility];  Service: Cardiovascular;  Laterality: N/A;   LEFT HEART CATHETERIZATION WITH CORONARY ANGIOGRAM N/A 12/09/2013   Procedure: LEFT HEART CATHETERIZATION WITH CORONARY ANGIOGRAM;  Surgeon: [name], MD;  Location: [facility] C[facility];  Service: Cardiovascular;  Laterality: N/A;   Liver biopsy     POLYPECTOMY  10/17/2017   Procedure: POLYPECTOMY;  Surgeon: [name], MD;  [facility];  Service: Endoscopy;;  ascending colon, splenic flexure,sigmoid x2   POLYPECTOMY  11/08/2020   Procedure: POLYPECTOMY;  Surgeon: [name], MD;  [facility];  Service: Gastroenterology;;   POLYPECTOMY  04/04/2021   Procedure: POLYPECTOMY;  Surgeon: [name], MD;  [facility];  Service: Gastroenterology;;   TENNIS ELBOW RELEASE/NIRSCHEL PROCEDURE Right 07/25/2017   Procedure: TENNIS ELBOW RELEASE and debriedment;  Surgeon: [name], MD;  Location: [facility];  Service: Orthopedics;  Laterality: Right;   TONSILLECTOMY  1970's   Patient Active Problem List   Diagnosis Date Noted   Atypical chest pain 03/06/2022   Diarrhea 12/14/2021   Iron deficiency anemia 08/15/2021   Orthostatic hypotension 12/07/2020   IBS (irritable bowel syndrome) 11/17/2020   Chronic diarrhea 08/17/2020   NASH (nonalcoholic steatohepatitis) 08/17/2020   Elevated LFTs 08/17/2020   Tremor of both hands 05/06/2020   Bilateral occipital neuralgia 05/06/2020   Abdominal pain, chronic, epigastric 01/13/2019   Anxiety 01/04/2019   Radicular pain of left lower extremity 12/22/2018   Moderate persistent asthma 10/25/2018   Dizziness 07/12/2018   Nonintractable headache    Abnormal  nuclear stress test    Aftercare following surgery 07/25/17 08/13/2017   Hx of colonic polyps 08/07/2017   Lateral epicondylitis of right elbow    Cervical nerve root impingement 12/09/2015   Generalized anxiety disorder 09/27/2015   Pain in the chest    Depression 01/24/2015   Esophageal reflux 01/24/2015   Preoperative cardiovascular examination 06/28/2014   DM type 2 causing vascular disease ([place])    Mixed hyperlipidemia    Obesity    Intermediate coronary syndrome ([place]) 12/09/2013   Lumbago 05/21/2012   Essential hypertension, benign 12/28/2009   CAD S/P LAD DES March 2015 12/28/2009   Hyperlipidemia LDL goal <70 11/25/2009   Accelerating angina ([place]) 11/25/2009   DM2 (diabetes mellitus, type 2) ([place]) 11/24/2009   ABDOMINAL PAIN, HX OF 11/24/2009    REFERRING DIAG: M47.816 (ICD-10-CM) - Spondylosis without myelopathy or radiculopathy, lumbar region M54.50,G89.29 (ICD-10-CM) - Chronic bilateral low back pain without sciatica M47.816 (ICD-10-CM) - Facet hypertrophy of lumbar region M79.18 (ICD-10-CM) - Myofascial pain syndrome   THERAPY DIAG:  Low back pain, unspecified back pain laterality, unspecified chronicity, unspecified whether sciatica present  Radiculopathy, lumbar region  Cervicalgia  Other abnormalities of gait and mobility  Muscle weakness (generalized)  Other symptoms and signs involving the musculoskeletal system  PERTINENT HISTORY: Chronic back pain  PRECAUTIONS: None  SUBJECTIVE: Patient states neck has been doing good. Low back has been good. Has one spot in the middle of his back that just came on. Must be from how he slept. Patient states 85% improvement since beginning PT. Remaining deficit is intermittent symptoms. States normal 2/ 10 mid back pain. Intermittent headache symptoms with certain sitting/laying positions. PAIN:  Are you having pain? No  OBJECTIVE:      POSTURE:  Forward head   PALPATION: Mod TTP about bilateral lumbar paraspinals     LUMBAR ROM:    Active  A/PROM  12/14/2021 AROM 12/26/21 AROM  01/01/22 AROM  01/25/22 AROM 02/22/22 AROM 04/03/22  Flexion 50% limited* 50% limited* 25% limited  10% limited  10% limited 0% limited  Extension 50% limited* 50% limited * 50% limited * 30% limited 25% limited  25% limited  Right lateral flexion 25% limited*    10% limited 0% limited  Left lateral flexion 25% limited*    10% limited  0% limited  Right rotation 25% limited       Left rotation 25% limited        (Blank rows = not tested)    Cervical AROM: Flexion 40 deg Extension 27 deg  RT rotation 25 deg LT rotation 40 deg   Cervical AROM: 7/18 Flexion 39 deg Extension 42 deg  RT rotation 57 deg LT rotation 62 deg    LE MMT:   MMT Right 12/14/2021 Right 01/25/2022 Right  02/22/22 Left 12/14/2021 Left 01/25/2022 Left 02/22/22 Left 04/03/22  Hip flexion 4+/5 5/5 5 4+/5 5/5 5 5/5  Hip extension 4/5 4+/5 5 4/5 4+/5 5 5/5  Hip abduction 4/5 5/5 5 4+/5 4/5 4+ 5/5  Hip adduction           Hip internal rotation           Hip external rotation           Knee flexion 5/5 5/5  5/5 5/5    Knee extension 5/5 5/5  5/5 5/5    Ankle dorsiflexion 5/5 5/5  5/5 5/5    Ankle plantarflexion           Ankle inversion           Ankle eversion            (Blank rows = not tested)   LUMBAR SPECIAL TESTS:  (+) Sacral compression    FUNCTIONAL TESTS:  2 minute walk test: 452 feet no AD    TODAY'S TREATMENT  04/03/22 Reassessment FOTO 57% function 2MWT: 475 feet    03/28/22 Chin tuck 10 x 3" Band rows BTB 15 x 3" Shoulder extension BTB 15 x 3" Bilat shoulder ER BTB 15 x 3" Shoulder horizontal abduction BTB 15 x 3"  Pec stretch at door 3 x 20"  03/26/22 Chin tuck 15 x 5" Upper trap stretch 3 x 20" Levator stretch 3 x 20"  Scap retraction 15 x 5"  Cervical sidebend iso 10 x 5"    Trigger Point Dry-Needling  Treatment instructions: Expect mild to moderate muscle soreness. S/S of pneumothorax if dry needled over a  lung field, and to seek immediate medical attention should they occur. Patient verbalized understanding of these instructions and education.  Patient Consent Given: Yes Education handout provided: Yes Muscles treated: Bilat cervical  paraspinals  Electrical stimulation performed: No Parameters: N/A Treatment response/outcome: good tolerance    Manual STM to Bilat cervical paraspinals pre and post dry needling for trigger point identification and surface area preparation    02/27/22 UBE 3/3 FWD/ Back lv 1 Band rows GTB 2 x 10  Shoulder extension GTB 2 x 10 Band ER GTB 2 x 10 Seated UT stretch 3 x 20" Palloff press GTB 2 x 10   Manual STM to bilateral thoracic paraspinals pre and post dry needling for trigger point identification and surface area preparation   Trigger Point Dry-Needling  Treatment instructions: Expect mild to moderate muscle soreness. S/S of pneumothorax if dry needled over a lung field, and to seek immediate medical attention should they occur. Patient verbalized understanding of these instructions and education.  Patient Consent Given: Yes Education handout provided: Previously provided Muscles treated: bilateral thoracic paraspinals (2 left, 1 right) Electrical stimulation performed: No Parameters: N/A Treatment response/outcome: good tolerance      02/22/22 Reassess MMT AROM  FOTO: 48% function Chin tuck x5 Scap retraction x5 Cervical rotation x5  Manual STM to bilateral upper trap pre and post dry needling for trigger point identification and surface area preparation   Trigger Point Dry-Needling  Treatment instructions: Expect mild to moderate muscle soreness. S/S of pneumothorax if dry needled over a lung field, and to seek immediate medical attention should they occur. Patient verbalized understanding of these instructions and education.  Patient Consent Given: Yes Education handout provided: Previously provided Muscles treated: bilateral upper  trap Electrical stimulation performed: No Parameters: N/A Treatment response/outcome: good tolerance, several twitches noted RT>LT   02/07/22  DRY NEEDLING: Dry needling consent given? yes Educational handouts provided? Previously given  Muscles treated: Lower R lumbar multifidi  Response from dry needling: good tolerance   Manual STM to bilateral lumbar paraspinals pre and post dry needling for trigger point identification and surface area preparation   DKTC with heels on green ball 10 x 5 second holds; 2 sets Prone hip extension 2x 10 bilateral     PATIENT EDUCATION:  Education details: exercise form and function, returning to PT if needed Person educated: Patient Education method: Explanation, Demonstration, and Handouts Education comprehension: verbalized understanding, returned demonstration, verbal cues required, and tactile cues required       HOME EXERCISE PROGRAM: 01/11/22 Ab march SLR [employer]   Access Code: [email or web address] URL: [email or web address]  03/28/22 - Seated Cervical Retraction  - 2 x daily - 7 x weekly - 1 sets - 10 reps - 3 second hold - Seated Scapular Retraction  - 2 x daily - 7 x weekly - 1 sets - 10 reps - 3 second hold - Shoulder External Rotation and Scapular Retraction with Resistance  - 2 x daily - 7 x weekly - 1 sets - 10 reps - 3 second hold - Standing Shoulder Row with Anchored Resistance  - 2 x daily - 7 x weekly - 2 sets - 10 reps - 3 second hold - Shoulder extension with resistance - Neutral  - 2 x daily - 7 x weekly - 2 sets - 10 reps - 3 second hold - Standing Shoulder Horizontal Abduction with Resistance  - 2 x daily - 7 x weekly - 2 sets - 10 reps - 3 second hold - Doorway Pec Stretch at 90 Degrees Abduction  - 2 x daily - 7 x weekly - 1 sets - 4 reps - 20 second hold - Seated Thoracic Lumbar Extension with Pectoralis  Stretch  - 2 x daily - 7 x weekly - 1 sets - 10 reps - 3 second hold  Date: 01/01/2022 Prepared by: Josue Hector  Exercises - Sidelying Thoracic and Shoulder Rotation  - 2-3 x daily - 7 x weekly - 1 sets - 10 reps - 5-10 second hold - Supine Piriformis Stretch with Foot on Ground  - 2-3 x daily - 7 x weekly - 1 sets - 3 reps - 20-30 second holdExercises - Seated Thoracic Lumbar Extension with Pectoralis Stretch  - 1 x daily - 7 x weekly - 1 sets - 10 reps - Shoulder Extension with Resistance  - 1 x daily - 7 x weekly - 3 sets - 10 reps - Scapular Retraction with Resistance  - 1 x daily - 7 x weekly - 3 sets - 10 reps - Seated Hamstring Stretch with Strap  - 1 x daily - 7 x weekly - 1 sets - 3 reps - 30 sec hold - Slant Board Gastrocnemius Stretch  - 1 x daily - 7 x weekly - 1 sets - 3 reps - 30 sec hold   Exercises - Supine Bridge  - 2 x daily - 7 x weekly - 1-2 sets - 10 reps - 5 second hold - Supine March  - 2 x daily - 7 x weekly - 1-2 sets - 10 reps - Supine Lower Trunk Rotation  - 2 x daily - 7 x weekly - 1 sets - 10 reps - 5 second hold3/30/23 press up  02/22/22 Chin tuck  Scap retraction  Cervical rotation    ASSESSMENT:   CLINICAL IMPRESSION: Patient has met all short and long term goals with ability to complete HEP and improvement in symptoms, strength, ROM, activity tolerance and functional mobility. He remains limited by intermittent symptoms. Patient with continued intermittent headache symptoms. Patient educated on continuing HEP and returning to PT if needed. Patient discharged from physical therapy at this time.    OBJECTIVE IMPAIRMENTS Abnormal gait, decreased activity tolerance, decreased endurance, decreased mobility, difficulty walking, decreased ROM, decreased strength, hypomobility, increased muscle spasms, impaired flexibility, improper body mechanics, postural dysfunction, and pain.    ACTIVITY LIMITATIONS cleaning, community activity, driving, meal prep, laundry, yard work, shopping, and yard work.    PERSONAL FACTORS Fitness, Past/current experiences, Time since  onset of injury/illness/exacerbation, and 3+ comorbidities: chronic back pain, DM, increased BMI, cardiac   are also affecting patient's functional outcome.      REHAB POTENTIAL: Good   CLINICAL DECISION MAKING: Stable/uncomplicated   EVALUATION COMPLEXITY: Low     GOALS: Goals reviewed with patient? Yes   SHORT TERM GOALS: Target date: 01/04/2022   Patient will be independent with HEP in order to improve functional outcomes. Baseline:  Goal status: MET    2.  Patient will report at least 25% improvement in symptoms for improved quality of life. Baseline: Reports 60% Goal status: MET       LONG TERM GOALS: Target date: 01/25/2022   Patient will report at least 75% improvement in (lumbar) symptoms for improved quality of life. Baseline: Reports 75%  Goal status: MET (modified)   2.  Patient will improve FOTO score by at least 10 points in order to indicate improved tolerance to activity. Baseline: 57% function Goal status: MET   3.  Patient will demonstrate at least 25% improvement in lumbar ROM in all restricted planes for improved ability to move trunk while completing chores. Baseline: See AROM  Goal status: MET  4.  Patient will be able to ambulate at least 450 feet in 2MWT with pain no greater than 2/10 in order to demonstrate improved tolerance to activity. Baseline: 475 feet, no change in symptoms Goal status: MET  5. Patient improve RT cervical rotation by at least 10 degrees in order to improve ability to scan environment for safety and while driving. Baseline: 25 degrees 7/18 see AROM Goal status: MET  6.  Patient will report at least 75% improvement in (cervical) symptoms for improved quality of life. Baseline: overall 85% improved Goal status: MET  PLAN: PT FREQUENCY: 1-2x/week   PT DURATION: 6 weeks   PLANNED INTERVENTIONS: PLANNED INTERVENTIONS: Therapeutic exercises, Therapeutic activity, Neuromuscular re-education, Balance training, Gait training,  Patient/Family education, Joint manipulation, Joint mobilization, Stair training, Orthotic/Fit training, DME instructions, Aquatic Therapy, Dry Needling, Electrical stimulation, Spinal manipulation, Spinal mobilization, Cryotherapy, Moist heat, Compression bandaging, scar mobilization, Splintting, Taping, Traction, Ultrasound, Ionotophoresis 67m/ml Dexamethasone, and Manual therapy     PLAN FOR NEXT SESSION: cervical and scapular strengthening. Continue manual and DN for mobility/pain. Continue extension based lumbar movements as indicated. Core and glute strength, postural strength   8:49 AM, 04/03/22 AMearl LatinPT, DPT [facility]

## 2022-04-03 NOTE — Telephone Encounter (Signed)
Pt scheduled for left heart cath on Tue, July 25 with Dr. Tamala Julian at 0900. Pt notified and voiced understanding.

## 2022-04-04 ENCOUNTER — Other Ambulatory Visit (HOSPITAL_COMMUNITY)
Admission: RE | Admit: 2022-04-04 | Discharge: 2022-04-04 | Disposition: A | Discharge status: Home / Self Care | Payer: Medicare Other | Source: Ambulatory Visit | Attending: Cardiology | Admitting: Cardiology

## 2022-04-04 DIAGNOSIS — I200 Unstable angina: Secondary | ICD-10-CM | POA: Diagnosis present

## 2022-04-04 LAB — CBC
HCT: 44.4 % (ref 39.0–52.0)
Hemoglobin: 15.5 g/dL (ref 13.0–17.0)
MCH: 30.7 pg (ref 26.0–34.0)
MCHC: 34.9 g/dL (ref 30.0–36.0)
MCV: 87.9 fL (ref 80.0–100.0)
Platelets: 142 10*3/uL — ABNORMAL LOW (ref 150–400)
RBC: 5.05 MIL/uL (ref 4.22–5.81)
RDW: 14.1 % (ref 11.5–15.5)
WBC: 6.8 10*3/uL (ref 4.0–10.5)
nRBC: 0 % (ref 0.0–0.2)

## 2022-04-04 LAB — BASIC METABOLIC PANEL
Anion gap: 9 (ref 5–15)
BUN: 12 mg/dL (ref 6–20)
CO2: 27 mmol/L (ref 22–32)
Calcium: 8.9 mg/dL (ref 8.9–10.3)
Chloride: 105 mmol/L (ref 98–111)
Creatinine, Ser: 0.95 mg/dL (ref 0.61–1.24)
GFR, Estimated: >60 mL/min (ref >60)
Glucose, Bld: 167 mg/dL — ABNORMAL HIGH (ref 70–99)
Potassium: 3.8 mmol/L (ref 3.5–5.1)
Sodium: 141 mmol/L (ref 135–145)

## 2022-04-05 ENCOUNTER — Encounter (HOSPITAL_COMMUNITY): Payer: Medicare Other | Admitting: Physical Therapy

## 2022-04-05 ENCOUNTER — Encounter (HOSPITAL_COMMUNITY): Payer: Self-pay | Admitting: Psychiatry

## 2022-04-05 ENCOUNTER — Telehealth (INDEPENDENT_AMBULATORY_CARE_PROVIDER_SITE_OTHER): Payer: Medicare Other | Admitting: Psychiatry

## 2022-04-05 DIAGNOSIS — F411 Generalized anxiety disorder: Secondary | ICD-10-CM | POA: Diagnosis not present

## 2022-04-05 DIAGNOSIS — F321 Major depressive disorder, single episode, moderate: Secondary | ICD-10-CM

## 2022-04-05 MED ORDER — ALPRAZOLAM 1 MG PO TABS: 1.0000 mg | ORAL_TABLET | Freq: Three times a day (TID) | ORAL | 3 refills | Status: DC | PRN

## 2022-04-05 MED ORDER — SERTRALINE HCL 100 MG PO TABS: 150.0000 mg | ORAL_TABLET | Freq: Every day | ORAL | 3 refills | Status: DC

## 2022-04-05 NOTE — Progress Notes (Signed)
Virtual Visit via Telephone Note  I connected with Jared Griffin on 04/05/22 at  9:00 AM EDT by telephone and verified that I am speaking with the correct person using two identifiers.  Location: Patient: home Provider: office   I discussed the limitations, risks, security and privacy concerns of performing an evaluation and management service by telephone and the availability of in person appointments. I also discussed with the patient that there may be a patient responsible charge related to this service. The patient expressed understanding and agreed to proceed.       I discussed the assessment and treatment plan with the patient. The patient was provided an opportunity to ask questions and all were answered. The patient agreed with the plan and demonstrated an understanding of the instructions.   The patient was advised to call back or seek an in-person evaluation if the symptoms worsen or if the condition fails to improve as anticipated.  I provided 15 minutes of non-face-to-face time during this encounter.   [name], MD  [facility] MD/PA/NP OP Progress Note  04/05/2022 9:20 AM Jared Griffin  MRN:  [id number]  Chief Complaint:  Chief Complaint  Patient presents with   Depression   Anxiety   Follow-up   HPI:  This patient is a 61 year old married white male lives with his wife and  son in [place]. 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  Recently he has been working at [employer] zone but had an arm injury at work and had to quit there as well  The patient returns for follow-up after 4 months regarding his depression and anxiety.  He seems to be doing fairly well.  He has had a lot of new medical issues.  He has developed unstable angina and has to have a cardiac catheterization next week.  He has struggled to keep his blood sugar under control but is now on Ozempic.  He was hoping to lose weight but nothing much  has changed.  He still enjoys mowing his lawn on his riding mower and spending time with his family.  He denies significant depression or anxiety.  He rarely has to use the trazodone for sleep.  He denies thoughts of self-harm or suicide. Visit Diagnosis:    ICD-10-CM   1. Moderate single current episode of major depressive disorder ([place])  F32.1     2. Generalized anxiety disorder  F41.1       Past Psychiatric History: none  Past Medical History:  Past Medical History:  Diagnosis Date   Anxiety    COPD (chronic obstructive pulmonary disease) ([place])    Coronary atherosclerosis of native coronary artery    a. 12/09/2013: s/p PCI with 3.0 x 28 Promus DES to mLAD   Depression    Diabetic neuropathy ([place])    Elbow injury 07/25/2017   Surgery for torn tendon   Essential hypertension    Fatty liver disease, nonalcoholic    GERD (gastroesophageal reflux disease)    Iron deficiency anemia 08/15/2021   Lateral epicondylitis of right elbow    Mixed hyperlipidemia    Obesity    Osgood-Schlatter's disease of right knee    Skin cyst 10/03/2017   Removed from back   Type 2 diabetes mellitus ([place])     Past Surgical History:  Procedure Laterality Date   BIOPSY  01/21/2019   Procedure: BIOPSY;  Surgeon: [name], MD;  [facility];  Service: Endoscopy;;  gastric bx and gastric polyps   BIOPSY  11/08/2020   Procedure: BIOPSY;  Surgeon: [name], MD;  [facility];  Service: Gastroenterology;;   CARDIAC CATHETERIZATION  2011   CARDIAC CATHETERIZATION N/A 09/20/2015   Procedure: Left Heart Cath and Coronary Angiography;  Surgeon: [name], MD;  Location: [place] [facility];  Service: Cardiovascular;  Laterality: N/A;   CHOLECYSTECTOMY  2003   COLONOSCOPY  06/19/2012   Procedure: COLONOSCOPY;  Surgeon: [name], MD;  [facility];  Service: Endoscopy;  Laterality: N/A;  930   COLONOSCOPY N/A 10/17/2017   Procedure:  COLONOSCOPY;  Surgeon: [name], MD;  [facility];  Service: Endoscopy;  Laterality: N/A;  1225   COLONOSCOPY WITH PROPOFOL N/A 11/08/2020   Procedure: COLONOSCOPY WITH PROPOFOL;  Surgeon: [name], MD;  [facility];  Service: Gastroenterology;  Laterality: N/A;  10:45   COLONOSCOPY WITH PROPOFOL N/A 04/04/2021   Procedure: COLONOSCOPY WITH PROPOFOL;  Surgeon: [name], MD;  [facility];  Service: Gastroenterology;  Laterality: N/A;   CYST EXCISION  07/2019   ESOPHAGOGASTRODUODENOSCOPY (EGD) WITH PROPOFOL N/A 01/21/2019   Procedure: ESOPHAGOGASTRODUODENOSCOPY (EGD) WITH PROPOFOL;  Surgeon: [name], MD;  [facility];  Service: Endoscopy;  Laterality: N/A;  10:15am   ESOPHAGOGASTRODUODENOSCOPY (EGD) WITH PROPOFOL N/A 02/17/2021   Procedure: ESOPHAGOGASTRODUODENOSCOPY (EGD) WITH PROPOFOL;  Surgeon: [name], MD;  [facility];  Service: Gastroenterology;  Laterality: N/A;  10:20   ESOPHAGOGASTRODUODENOSCOPY (EGD) WITH PROPOFOL N/A 04/04/2021   Procedure: ESOPHAGOGASTRODUODENOSCOPY (EGD) WITH PROPOFOL;  Surgeon: [name], MD;  [facility];  Service: Gastroenterology;  Laterality: N/A;  12:30   GIVENS CAPSULE STUDY N/A 09/13/2021   Procedure: GIVENS CAPSULE STUDY;  Surgeon: [name], MD;  [facility];  Service: Gastroenterology;  Laterality: N/A;  7:30   KNEE ARTHROPLASTY Right 1999   LEFT HEART CATH AND CORONARY ANGIOGRAPHY N/A 02/26/2018   Procedure: LEFT HEART CATH AND CORONARY ANGIOGRAPHY;  Surgeon: [name], MD;  Location: [place] [facility];  Service: Cardiovascular;  Laterality: N/A;   LEFT HEART CATHETERIZATION WITH CORONARY ANGIOGRAM N/A 12/09/2013   Procedure: LEFT HEART CATHETERIZATION WITH CORONARY ANGIOGRAM;  Surgeon: [name], MD;  Location: [facility] CATH LAB;  Service: Cardiovascular;  Laterality:  N/A;   Liver biopsy     POLYPECTOMY  10/17/2017   Procedure: POLYPECTOMY;  Surgeon: [name], MD;  [facility];  Service: Endoscopy;;  ascending colon, splenic flexure,sigmoid x2   POLYPECTOMY  11/08/2020   Procedure: POLYPECTOMY;  Surgeon: [name], MD;  [facility];  Service: Gastroenterology;;   POLYPECTOMY  04/04/2021   Procedure: POLYPECTOMY;  Surgeon: [name], MD;  [facility];  Service: Gastroenterology;;   TENNIS ELBOW RELEASE/NIRSCHEL PROCEDURE Right 07/25/2017   Procedure: TENNIS ELBOW RELEASE and debriedment;  Surgeon: [name], MD;  Location: [facility];  Service: Orthopedics;  Laterality: Right;   TONSILLECTOMY  1970's    Family Psychiatric History: See below  Family History:  Family History  Problem Relation Age of Onset   Osteoporosis Mother    Cancer Maternal Aunt    CVA Maternal Grandmother    CVA Maternal Grandfather    Healthy Son     Social History:  Social History   Socioeconomic History   Marital status: Married  Spouse name: [name]   Number of children: 1   Years of education: Not on file   Highest education level: Not on file  Occupational History   Not on file  Tobacco Use   Smoking status: Former    Packs/day: 0.50    Years: 10.00    Total pack years: 5.00    Types: Cigarettes    Start date: 02/02/1973    Quit date: 09/17/1983    Years since quitting: 38.5   Smokeless tobacco: Former    Types: Chew    Quit date: 09/17/1983  Vaping Use   Vaping Use: Never used  Substance and Sexual Activity   Alcohol use: No    Alcohol/week: 0.0 standard drinks of alcohol   Drug use: No   Sexual activity: Yes  Other Topics Concern   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    Married with 1 child-son   Right handed    12 th    Very little caffeine   One story 12 stairs up to door  and 8 steps to front door   Social Determinants of Health   Financial Resource Strain: Low Risk  (08/08/2021)   Overall Financial Resource Strain (CARDIA)    Difficulty of Paying Living Expenses: Not hard at all  Food Insecurity: No Food Insecurity (08/08/2021)   Hunger Vital Sign    Worried About Running Out of Food in the Last Year: Never true    Ran Out of Food in the Last Year: Never true  Transportation Needs: No Transportation Needs (08/08/2021)   PRAPARE - Hydrologist (Medical): No    Lack of Transportation (Non-Medical): No  Physical Activity: Insufficiently Active (08/08/2021)   Exercise Vital Sign    Days of Exercise per Week: 3 days    Minutes of Exercise per Session: 30 min  Stress: No Stress Concern Present (08/08/2021)   [place]    Feeling of Stress : Not at all  Social Connections: [place] (08/08/2021)   Social Connection and Isolation Panel [NHANES]    Frequency of Communication with Friends and Family: More than three times a week    Frequency of Social Gatherings with Friends and Family: More than three times a week    Attends Religious Services: More than 4 times per year    Active Member of [employer] or Organizations: Yes    Attends Music therapist: More than 4 times per year    Marital Status: Married    Allergies:  Allergies  Allergen Reactions   Doxycycline     Severe esophagitis    Metabolic Disorder Labs: Lab Results  Component Value Date   HGBA1C 7.5 (H) 02/07/2022   MPG 211.6 07/12/2018   MPG 197.25 07/23/2017   No results found for: "PROLACTIN" Lab Results  Component Value Date   CHOL 194 02/07/2022   TRIG 372 (H) 02/07/2022   HDL 38 (L) 02/07/2022   CHOLHDL 5.1 (H) 02/07/2022   VLDL UNABLE TO CALCULATE IF TRIGLYCERIDE OVER 400 mg/dL 07/12/2018   LDLCALC 94 02/07/2022   LDLCALC 71 05/25/2021   Lab Results   Component Value Date   TSH 0.648 08/20/2019   TSH 0.65 08/20/2019    Therapeutic Level Labs: No results found for: "LITHIUM" No results found for: "VALPROATE" No results found for: "CBMZ"  Current Medications:  Current Outpatient Medications  Medication Sig Dispense Refill   acetaminophen (TYLENOL) 500 MG tablet Take 1,500 mg by mouth daily as needed for moderate pain.     albuterol (VENTOLIN HFA) 108 (90 Base) MCG/ACT inhaler INHALE 2 PUFFS INTO LUNGS EVERY 6 HOURS AS NEEDED FOR WHEEZING AND SHORTNESS OF BREATH. (Patient taking differently: Inhale 2 puffs into the lungs every 6 (six) hours as needed for shortness of breath or wheezing. INHALE 2 PUFFS INTO LUNGS EVERY 6 HOURS AS NEEDED FOR WHEEZING AND SHORTNESS OF BREATH.) 8.5 g 5   ALPRAZolam (XANAX) 1 MG tablet Take 1 tablet (1 mg total) by mouth 3 (three) times daily as needed for anxiety. 90 tablet 3   aspirin 81 MG EC tablet Take 1 tablet (81 mg total) by mouth daily. 30 tablet 12   atorvastatin (LIPITOR) 40 MG tablet TAKE ONE TABLET (40MG TOTAL) BY MOUTH DAILY 90 tablet 1   B-D UF III MINI PEN NEEDLES 31G X 5 MM MISC USE UP TO 4 TIMES DAILY WITH INSULIN AS DIRECTED. 100 each 3   colestipol (COLESTID) 1 g tablet Take 2 tablets (2 g total) by mouth daily. Take 4 hours apart from other medication (Patient taking differently: Take 1 g by mouth daily as needed (diarrhea). Take 4 hours apart from other medication) 180 tablet 3   Continuous Blood Gluc Sensor (FREESTYLE LIBRE 14 DAY SENSOR) MISC Inject 1 each into the skin every 14 (fourteen) days. Use as directed. 2 each 2   dapagliflozin propanediol (FARXIGA) 5 MG TABS tablet Take 5 mg by mouth daily.     ezetimibe (ZETIA) 10 MG tablet Take 1 tablet (10 mg total) by mouth daily. 30 tablet 5   fluticasone (FLONASE) 50 MCG/ACT nasal spray Place 2 sprays into both nostrils daily. (Patient taking differently: Place 2 sprays into both nostrils daily as needed for allergies.) 16 g 0    fluticasone-salmeterol (ADVAIR HFA) 115-21 MCG/ACT inhaler INHALE 2 PUFFS INTO THE LUNGS TWICE DAILY (Patient taking differently: Inhale 2 puffs into the lungs 2 (two) times daily as needed (shortness of breath).) 12 g 1   gabapentin (NEURONTIN) 300 MG capsule 2 qam and 2qevening (Patient taking differently: Take 600 mg by mouth 2 (two) times daily.) 120 capsule 6   HUMALOG KWIKPEN 200 UNIT/ML KwikPen Inject 30-55 Units into the skin 3 (three) times daily before meals.     isosorbide mononitrate (IMDUR) 30 MG 24 hr tablet Take 1 tablet (30 mg total) by mouth daily. 90 tablet 3   Lancets MISC 1 each by Does not apply route 4 (four) times daily. 150 each 5   loratadine (CLARITIN) 10 MG tablet Take 10 mg by mouth daily as needed for allergies.     losartan (COZAAR) 25 MG tablet TAKE ONE (1) TABLET BY MOUTH EVERY DAY 90 tablet 3   metFORMIN (GLUCOPHAGE-XR) 500 MG 24 hr tablet Take 1,000 mg by mouth 2 (two) times daily.     methocarbamol (ROBAXIN) 500 MG tablet Take 500 mg by mouth every 8 (eight) hours as needed for muscle spasms.     nitroGLYCERIN (NITROSTAT) 0.4 MG SL tablet Place 1 tablet (0.4 mg total) under the tongue every 5 (five) minutes as needed. 25 tablet 3   omeprazole (PRILOSEC) 40 MG capsule Take 1 capsule (40 mg total) by mouth daily. 90 capsule 3   ondansetron (ZOFRAN-ODT) 8 MG disintegrating tablet TAKE ONE TABLET (8MG TOTAL) BY MOUTH EVERY 8 HOURS AS NEEDED FRO NAUSEA OR VOMITING (Patient taking  differently: Take 8 mg by mouth every 8 (eight) hours as needed for nausea or vomiting.) 30 tablet 5   propranolol (INDERAL) 20 MG tablet Take 1 tablet (20 mg total) by mouth 2 (two) times daily. 180 tablet 3   ranolazine (RANEXA) 1000 MG SR tablet TAKE ONE TABLET (1000MG TOTAL) BY MOUTH TWO TIMES DAILY 180 tablet 1   Semaglutide,0.25 or 0.5MG/DOS, (OZEMPIC, 0.25 OR 0.5 MG/DOSE,) 2 MG/3ML SOPN Inject 0.25 mg as directed every Friday.     sertraline (ZOLOFT) 100 MG tablet Take 1.5 tablets (150 mg  total) by mouth daily. 135 tablet 3   Tetrahydrozoline HCl (VISINE OP) Place 1 drop into both eyes daily as needed (irritation).     TOUJEO MAX SOLOSTAR 300 UNIT/ML Solostar Pen INJECT 130 UNITS INTO THE SKIN AT BEDTIME. (Patient taking differently: Inject 50-55 Units into the skin at bedtime.) 12 mL 0   traZODone (DESYREL) 50 MG tablet Take 1 tablet (50 mg total) by mouth at bedtime as needed. 30 tablet 2   No current facility-administered medications for this visit.     Musculoskeletal: Strength & Muscle Tone: na Gait & Station: na Patient leans: N/A  Psychiatric Specialty Exam: Review of Systems  Cardiovascular:  Positive for chest pain.  Musculoskeletal:  Positive for back pain and neck pain.  All other systems reviewed and are negative.   There were no vitals taken for this visit.There is no height or weight on file to calculate BMI.  General Appearance: NA  Eye Contact:  NA  Speech:  Clear and Coherent  Volume:  Normal  Mood:  Euthymic  Affect:  NA  Thought Process:  Goal Directed  Orientation:  Full (Time, Place, and Person)  Thought Content: WDL   Suicidal Thoughts:  No  Homicidal Thoughts:  No  Memory:  Immediate;   Good Recent;   Good Remote;   Good  Judgement:  Good  Insight:  Good  Psychomotor Activity:  Normal  Concentration:  Concentration: Good and Attention Span: Good  Recall:  Good  Fund of Knowledge: Good  Language: Good  Akathisia:  No  Handed:  Right  AIMS (if indicated): not done  Assets:  Communication Skills Desire for Improvement Resilience Social Support Talents/Skills  ADL's:  Intact  Cognition: WNL  Sleep:  Good   Screenings: GAD-7    Flowsheet Row Office Visit from 08/08/2020 in [facility] Visit from 08/05/2018 in [facility]  Total GAD-7 Score 3 5      PHQ2-9    Flowsheet Row Video Visit from 04/05/2022 in [facility] Video Visit from 12/04/2021  in [facility] Video Visit from 09/05/2021 in [facility] from 08/08/2021 in [facility] Visit from 06/21/2021 in [facility]  PHQ-2 Total Score 1 0 0 0 0  PHQ-9 Total Score -- -- -- 0 0      Flowsheet Row Video Visit from 04/05/2022 in [place] [facility] ED to Hosp-Admission (Discharged) from 03/06/2022 in [place] ED from 12/17/2021 in [place] No Risk No Risk No Risk        Assessment and Plan: This patient is a 61 year old male with a history of depression anxiety and chronic pain.  He seems to be in fairly good spirits despite his recent health issues.  He will continue Zoloft 150 mg for depression and Xanax 1  mg up to 3 times daily as needed for anxiety.  He occasionally uses trazodone 50 mg at bedtime for sleep.  He will return to see me in 4 months  Collaboration of Care: Collaboration of Care: Primary Care Provider AEB notes are shared with PCP through the epic system  Patient/Guardian was advised Release of Information must be obtained prior to any record release in order to collaborate their care with an outside provider. Patient/Guardian was advised if they have not already done so to contact the registration department to sign all necessary forms in order for [place] to release information regarding their care.   Consent: Patient/Guardian gives verbal consent for treatment and assignment of benefits for services provided during this visit. Patient/Guardian expressed understanding and agreed to proceed.    [name], MD 04/05/2022, 9:21 AM

## 2022-04-09 ENCOUNTER — Encounter (HOSPITAL_COMMUNITY): Payer: Medicare Other | Admitting: Physical Therapy

## 2022-04-09 ENCOUNTER — Telehealth: Payer: Self-pay | Admitting: *Deleted

## 2022-04-09 NOTE — Telephone Encounter (Addendum)
Cardiac Catheterization scheduled at [facility] for: Tuesday April 10, 2022 9 AM Arrival time and place: [facility] Main Entrance A at: 7 AM   Nothing to eat after midnight prior to procedure, clear liquids until 5 AM day of procedure.  Medication instructions: -Hold:  Metformin-day of procedure and 48 hours post procedure  Farxiga-AM of procedure  Insulin-AM of procedure/1/2/usual Insulin HS prior to procedure -Except hold medications usual morning medications can be taken with sips of water including aspirin 81 mg.   Confirmed patient has responsible adult to drive home post procedure and be with patient first 24 hours after arriving home.  Patient reports no new symptoms concerning for COVID-19 in the past 10 days.  Reviewed procedure instructions with patient.

## 2022-04-09 NOTE — H&P (Signed)
Symptoms are increasing with some atypical features.   Cath 2019  Coronary Diagrams  Diagnostic Dominance: Right

## 2022-04-10 ENCOUNTER — Ambulatory Visit (HOSPITAL_COMMUNITY)
Admission: RE | Admit: 2022-04-10 | Discharge: 2022-04-10 | Disposition: A | Discharge status: Home / Self Care | Payer: Medicare Other | Attending: Interventional Cardiology | Admitting: Interventional Cardiology

## 2022-04-10 ENCOUNTER — Other Ambulatory Visit: Payer: Self-pay

## 2022-04-10 ENCOUNTER — Encounter (HOSPITAL_COMMUNITY)
Admission: RE | Disposition: A | Discharge status: Home / Self Care | Payer: Self-pay | Source: Home / Self Care | Attending: Interventional Cardiology

## 2022-04-10 ENCOUNTER — Encounter (HOSPITAL_COMMUNITY): Payer: Self-pay | Admitting: Interventional Cardiology

## 2022-04-10 DIAGNOSIS — I200 Unstable angina: Secondary | ICD-10-CM | POA: Diagnosis present

## 2022-04-10 DIAGNOSIS — Z955 Presence of coronary angioplasty implant and graft: Secondary | ICD-10-CM | POA: Diagnosis not present

## 2022-04-10 DIAGNOSIS — I25110 Atherosclerotic heart disease of native coronary artery with unstable angina pectoris: Secondary | ICD-10-CM | POA: Diagnosis present

## 2022-04-10 DIAGNOSIS — E785 Hyperlipidemia, unspecified: Secondary | ICD-10-CM | POA: Diagnosis present

## 2022-04-10 DIAGNOSIS — I2510 Atherosclerotic heart disease of native coronary artery without angina pectoris: Secondary | ICD-10-CM

## 2022-04-10 DIAGNOSIS — R0789 Other chest pain: Secondary | ICD-10-CM | POA: Diagnosis present

## 2022-04-10 HISTORY — PX: CORONARY PRESSURE/FFR STUDY: CATH118243

## 2022-04-10 HISTORY — PX: LEFT HEART CATH AND CORONARY ANGIOGRAPHY: CATH118249

## 2022-04-10 HISTORY — PX: INTRAVASCULAR PRESSURE WIRE/FFR STUDY: CATH118243

## 2022-04-10 LAB — POCT ACTIVATED CLOTTING TIME: Activated Clotting Time: 245 seconds

## 2022-04-10 LAB — GLUCOSE, CAPILLARY: Glucose-Capillary: 152 mg/dL — ABNORMAL HIGH (ref 70–99)

## 2022-04-10 SURGERY — LEFT HEART CATH AND CORONARY ANGIOGRAPHY
Anesthesia: LOCAL

## 2022-04-10 MED ORDER — MIDAZOLAM HCL 2 MG/2ML IJ SOLN
INTRAMUSCULAR | Status: DC | PRN
  Administered 2022-04-10 (×2): 1 mg via INTRAVENOUS

## 2022-04-10 MED ORDER — FENTANYL CITRATE (PF) 100 MCG/2ML IJ SOLN
INTRAMUSCULAR | Status: DC | PRN
  Administered 2022-04-10: 25 ug via INTRAVENOUS

## 2022-04-10 MED ORDER — HEPARIN (PORCINE) IN NACL 1000-0.9 UT/500ML-% IV SOLN
INTRAVENOUS | Status: DC | PRN
  Administered 2022-04-10 (×2): 500 mL

## 2022-04-10 MED ORDER — HYDRALAZINE HCL 20 MG/ML IJ SOLN: 10.0000 mg | INTRAMUSCULAR | Status: DC | PRN

## 2022-04-10 MED ORDER — LIDOCAINE HCL (PF) 1 % IJ SOLN
INTRAMUSCULAR | Status: AC
  Filled 2022-04-10: qty 30

## 2022-04-10 MED ORDER — HEPARIN (PORCINE) IN NACL 1000-0.9 UT/500ML-% IV SOLN
INTRAVENOUS | Status: AC
  Filled 2022-04-10: qty 1000

## 2022-04-10 MED ORDER — SODIUM CHLORIDE 0.9% FLUSH: 3.0000 mL | INTRAVENOUS | Status: DC | PRN

## 2022-04-10 MED ORDER — ONDANSETRON HCL 4 MG/2ML IJ SOLN: 4.0000 mg | Freq: Four times a day (QID) | INTRAMUSCULAR | Status: DC | PRN

## 2022-04-10 MED ORDER — ADENOSINE (DIAGNOSTIC) 140MCG/KG/MIN
INTRAVENOUS | Status: DC | PRN
  Administered 2022-04-10: 140 ug/kg/min via INTRAVENOUS

## 2022-04-10 MED ORDER — SODIUM CHLORIDE 0.9 % WEIGHT BASED INFUSION
3.0000 mL/kg/h | INTRAVENOUS | Status: AC
  Administered 2022-04-10: 3 mL/kg/h via INTRAVENOUS

## 2022-04-10 MED ORDER — SODIUM CHLORIDE 0.9% FLUSH: 3.0000 mL | Freq: Two times a day (BID) | INTRAVENOUS | Status: DC

## 2022-04-10 MED ORDER — ASPIRIN 81 MG PO CHEW
81.0000 mg | CHEWABLE_TABLET | ORAL | Status: AC
  Administered 2022-04-10: 81 mg via ORAL

## 2022-04-10 MED ORDER — ADENOSINE 12 MG/4ML IV SOLN
INTRAVENOUS | Status: AC
  Filled 2022-04-10: qty 16

## 2022-04-10 MED ORDER — ACETAMINOPHEN 325 MG PO TABS: 650.0000 mg | ORAL_TABLET | ORAL | Status: DC | PRN

## 2022-04-10 MED ORDER — ASPIRIN 81 MG PO CHEW
81.0000 mg | CHEWABLE_TABLET | ORAL | Status: DC
  Filled 2022-04-10: qty 1

## 2022-04-10 MED ORDER — SODIUM CHLORIDE 0.9 % IV SOLN: 250.0000 mL | INTRAVENOUS | Status: DC | PRN

## 2022-04-10 MED ORDER — MIDAZOLAM HCL 2 MG/2ML IJ SOLN
INTRAMUSCULAR | Status: AC
  Filled 2022-04-10: qty 2

## 2022-04-10 MED ORDER — FENTANYL CITRATE (PF) 100 MCG/2ML IJ SOLN
INTRAMUSCULAR | Status: AC
  Filled 2022-04-10: qty 2

## 2022-04-10 MED ORDER — LIDOCAINE HCL (PF) 1 % IJ SOLN
INTRAMUSCULAR | Status: DC | PRN
  Administered 2022-04-10: 2 mL

## 2022-04-10 MED ORDER — IOHEXOL 350 MG/ML SOLN
INTRAVENOUS | Status: DC | PRN
  Administered 2022-04-10: 80 mL

## 2022-04-10 MED ORDER — SODIUM CHLORIDE 0.9 % IV SOLN
INTRAVENOUS | Status: DC
Start: 2022-04-10 — End: 2022-04-10

## 2022-04-10 MED ORDER — SODIUM CHLORIDE 0.9 % WEIGHT BASED INFUSION: 1.0000 mL/kg/h | INTRAVENOUS | Status: DC

## 2022-04-10 MED ORDER — VERAPAMIL HCL 2.5 MG/ML IV SOLN
INTRAVENOUS | Status: AC
  Filled 2022-04-10: qty 2

## 2022-04-10 MED ORDER — HEPARIN SODIUM (PORCINE) 1000 UNIT/ML IJ SOLN
INTRAMUSCULAR | Status: DC | PRN
  Administered 2022-04-10: 5000 [IU] via INTRAVENOUS
  Administered 2022-04-10: 4000 [IU] via INTRAVENOUS

## 2022-04-10 MED ORDER — VERAPAMIL HCL 2.5 MG/ML IV SOLN
INTRAVENOUS | Status: DC | PRN
  Administered 2022-04-10: 10 mL via INTRA_ARTERIAL

## 2022-04-10 MED ORDER — LABETALOL HCL 5 MG/ML IV SOLN
10.0000 mg | INTRAVENOUS | Status: DC | PRN
  Administered 2022-04-10: 10 mg via INTRAVENOUS
  Filled 2022-04-10: qty 4

## 2022-04-10 MED ORDER — HEPARIN SODIUM (PORCINE) 1000 UNIT/ML IJ SOLN
INTRAMUSCULAR | Status: AC
  Filled 2022-04-10: qty 10

## 2022-04-10 SURGICAL SUPPLY — 14 items
BAND CMPR LRG ZPHR (HEMOSTASIS) ×1
BAND ZEPHYR COMPRESS 30 LONG (HEMOSTASIS) ×1 IMPLANT
CATH 5FR JL3.5 JR4 ANG PIG MP (CATHETERS) ×1 IMPLANT
CATH VISTA GUIDE 6FR XBLAD3.5 (CATHETERS) ×1 IMPLANT
GLIDESHEATH SLEND A-KIT 6F 22G (SHEATH) ×1 IMPLANT
GUIDEWIRE INQWIRE 1.5J.035X260 (WIRE) IMPLANT
GUIDEWIRE PRESSURE X 175 (WIRE) ×1 IMPLANT
INQWIRE 1.5J .035X260CM (WIRE) ×2
KIT ESSENTIALS PG (KITS) ×1 IMPLANT
KIT HEART LEFT (KITS) ×2 IMPLANT
PACK CARDIAC CATHETERIZATION (CUSTOM PROCEDURE TRAY) ×2 IMPLANT
SHEATH PROBE COVER 6X72 (BAG) ×1 IMPLANT
TRANSDUCER W/STOPCOCK (MISCELLANEOUS) ×2 IMPLANT
TUBING CIL FLEX 10 FLL-RA (TUBING) ×2 IMPLANT

## 2022-04-10 NOTE — CV Procedure (Signed)
Widely patent left main, LAD, circumflex, and RCA. LAD contains focal 25 to 40% narrowing proximal to the LAD stent and 40% narrowing distal. CMD study demonstrated evidence of microvascular dysfunction with IMR 36 and CFR 2.4.  FFR across the LAD stent is 0.89.   Widely patent epicardial coronary arteries with evidence of microvascular dysfunction with elevated IMR and reduced CFR.  This mandates medical therapy which should include cardiac rehab, exercise, lipid-lowering, beta-blockers, and calcium channel blocker therapy.  Sublingual nitroglycerin for spontaneous severe discomfort.  Long-acting nitrates should be avoided since there is a fixed component to the patient's microvascular disease obstruction and could therefore cause worsening symptoms.

## 2022-04-11 ENCOUNTER — Telehealth: Payer: Self-pay | Admitting: Cardiology

## 2022-04-11 ENCOUNTER — Encounter (HOSPITAL_COMMUNITY): Payer: Medicare Other | Admitting: Physical Therapy

## 2022-04-11 MED ORDER — AMLODIPINE BESYLATE 5 MG PO TABS: 5.0000 mg | ORAL_TABLET | Freq: Every day | ORAL | 3 refills | Status: DC

## 2022-04-11 NOTE — Addendum Note (Signed)
Addended by: Berlinda Last on: 04/11/2022 03:32 PM   Modules accepted: Orders

## 2022-04-11 NOTE — Telephone Encounter (Signed)
Patient notified and verbalized understanding. Patient had no questions or concerns at this time.

## 2022-04-11 NOTE — Telephone Encounter (Signed)
Looks like Dr. Tamala Julian stopped the imdur.  Please make sure and add amlodipine 5 mg po daily.  For micro vascular coronary disease.   Thank you

## 2022-04-20 ENCOUNTER — Inpatient Hospital Stay: Payer: Medicare Other | Attending: Physician Assistant

## 2022-04-20 DIAGNOSIS — I25119 Atherosclerotic heart disease of native coronary artery with unspecified angina pectoris: Secondary | ICD-10-CM | POA: Insufficient documentation

## 2022-04-20 DIAGNOSIS — R42 Dizziness and giddiness: Secondary | ICD-10-CM | POA: Diagnosis not present

## 2022-04-20 DIAGNOSIS — Z79899 Other long term (current) drug therapy: Secondary | ICD-10-CM | POA: Insufficient documentation

## 2022-04-20 DIAGNOSIS — G2581 Restless legs syndrome: Secondary | ICD-10-CM | POA: Insufficient documentation

## 2022-04-20 DIAGNOSIS — D509 Iron deficiency anemia, unspecified: Secondary | ICD-10-CM | POA: Insufficient documentation

## 2022-04-20 DIAGNOSIS — F5089 Other specified eating disorder: Secondary | ICD-10-CM | POA: Diagnosis not present

## 2022-04-20 DIAGNOSIS — F32A Depression, unspecified: Secondary | ICD-10-CM | POA: Insufficient documentation

## 2022-04-20 LAB — CBC WITH DIFFERENTIAL/PLATELET
Abs Immature Granulocytes: 0.04 10*3/uL (ref 0.00–0.07)
Basophils Absolute: 0.1 10*3/uL (ref 0.0–0.1)
Basophils Relative: 1 %
Eosinophils Absolute: 0.1 10*3/uL (ref 0.0–0.5)
Eosinophils Relative: 2 %
HCT: 48.3 % (ref 39.0–52.0)
Hemoglobin: 16.6 g/dL (ref 13.0–17.0)
Immature Granulocytes: 1 %
Lymphocytes Relative: 27 %
Lymphs Abs: 1.9 10*3/uL (ref 0.7–4.0)
MCH: 30.1 pg (ref 26.0–34.0)
MCHC: 34.4 g/dL (ref 30.0–36.0)
MCV: 87.5 fL (ref 80.0–100.0)
Monocytes Absolute: 0.5 10*3/uL (ref 0.1–1.0)
Monocytes Relative: 7 %
Neutro Abs: 4.4 10*3/uL (ref 1.7–7.7)
Neutrophils Relative %: 62 %
Platelets: 174 10*3/uL (ref 150–400)
RBC: 5.52 MIL/uL (ref 4.22–5.81)
RDW: 13.9 % (ref 11.5–15.5)
WBC: 7.1 10*3/uL (ref 4.0–10.5)
nRBC: 0 % (ref 0.0–0.2)

## 2022-04-20 LAB — IRON AND TIBC
Iron: 164 ug/dL (ref 45–182)
Saturation Ratios: 31 % (ref 17.9–39.5)
TIBC: 523 ug/dL — ABNORMAL HIGH (ref 250–450)
UIBC: 359 ug/dL

## 2022-04-20 LAB — FERRITIN: Ferritin: 92 ng/mL (ref 24–336)

## 2022-04-26 NOTE — Progress Notes (Signed)
Virtual Visit via Telephone Note [facility]  I connected with Jared Griffin  on 04/27/22 at  12:53 PM by telephone and verified that I am speaking with the correct person using two identifiers.  Location: Patient: Home Provider: [facility]   I discussed the limitations, risks, security and privacy concerns of performing an evaluation and management service by telephone and the availability of in person appointments. I also discussed with the patient that there may be a patient responsible charge related to this service. The patient expressed understanding and agreed to proceed.   INTERVAL HISTORY: [name] (61 year old male) is contacted today for follow-up of his iron deficiency anemia.  He was last evaluated via telemedicine visit by Tarri Abernethy PA-C on 12/20/2021.  His most recent IV iron was on 08/24/2021 and 08/31/2021.  Patient reports some recurrent mild fatigue over the past 3 months.  His pica has resolved and his restless leg symptoms have become less intense.  He has not had any nosebleeds in the past few months. He denies any melena or hematochezia. He continues to follow with Dr. [name] for chronic angina, and was hospitalized from 03/06/2022 through 03/08/2022 for atypical chest pain in the setting of preexistent CAD.  He has some chronic shortness of breath on exertion with his angina.  Occasional lightheadedness.  No syncopal episodes.  He has 75 % energy and 100% appetite.  He reports that he is maintaining a stable weight.    OBSERVATIONS/OBJECTIVE: Review of Systems  Constitutional:  Negative for chills, diaphoresis, fever, malaise/fatigue and weight loss.  Respiratory:  Positive for cough. Negative for shortness of breath.   Cardiovascular:  Negative for chest pain and palpitations.  Gastrointestinal:  Negative for abdominal pain, blood in stool, melena, nausea and vomiting.  Neurological:  Positive for tingling. Negative  for dizziness and headaches.  Psychiatric/Behavioral:  Positive for depression. The patient is nervous/anxious.      PHYSICAL EXAM (per limitations of virtual telephone visit): The patient is alert and oriented x 3, exhibiting adequate mentation, good mood, and ability to speak in full sentences and execute sound judgement.   ASSESSMENT & PLAN: 1.  Iron deficiency anemia - Seen for initial consultation by Dr. Chryl Heck on 06/16/2021 - EGD (04/04/2021): Normal esophagus, previous pill esophageal ulcer has healed; multiple gastric polyps, normal duodenum - Colonoscopy (04/04/2021): Inadequate prep, external and internal hemorrhoids, polyps, medium sized lipoma in transverse colon - No gross GI hemorrhage - denies bright red blood per rectum and melena     - Previously was having frequent nosebleeds, but these have improved   - Takes daily iron pill without adverse side effects.  Also takes omeprazole daily. - Symptoms of fatigue, pica, RLS improved after IV iron - Initial labs (06/20/2021) show normal Hgb 13.7/MCV 83.5, but significant iron deficiency with ferritin 12, iron saturation 7%, and TIBC 549.  B12 and folate were normal. - Most recent labs (04/20/2022): Hgb 16.6/MCV 87.5.  Ferritin 92, iron saturation 31% with elevated TIBC 523. - Suspect iron deficiency from frequent epistaxis, compounded by malabsorption in the setting of chronic disease and PPI use. - PLAN: Recommend IV Feraheme x 1 dose. - Repeat labs and RTC (office visit) in 6 months.    I discussed the assessment and treatment plan with the patient. The patient was provided an opportunity to ask questions and all were answered. The patient agreed with the plan and demonstrated an understanding of the instructions.   The  patient was advised to call back or seek an in-person evaluation if the symptoms worsen or if the condition fails to improve as anticipated.  I provided 18 minutes of non-face-to-face time during this  encounter.   Harriett Rush, PA-C 04/27/22 6:29 PM

## 2022-04-27 ENCOUNTER — Inpatient Hospital Stay (HOSPITAL_BASED_OUTPATIENT_CLINIC_OR_DEPARTMENT_OTHER): Payer: Medicare Other | Admitting: Physician Assistant

## 2022-04-27 DIAGNOSIS — D509 Iron deficiency anemia, unspecified: Secondary | ICD-10-CM

## 2022-04-30 ENCOUNTER — Ambulatory Visit: Payer: Medicare Other | Admitting: Cardiology

## 2022-04-30 NOTE — Progress Notes (Deleted)
Cardiology Office Note  Date: 04/30/2022   ID: CHIRON CAMPIONE, DOB [birth year], MRN [id number]  PCP:  [name], MD  Cardiologist:  Rozann Lesches, MD Electrophysiologist:  None   No chief complaint on file.   History of Present Illness: Jared Griffin is a 61 y.o. male last seen in July by Ms. Ingold NP.  I reviewed the prior note and subsequent cardiac catheterization by Dr. Tamala Julian on July 25.  He had evidence of patent stent site at that time, also microvascular dysfunction with recommendation for medical therapy.  Past Medical History:  Diagnosis Date   Anxiety    COPD (chronic obstructive pulmonary disease) ([place])    Coronary atherosclerosis of native coronary artery    a. 12/09/2013: s/p PCI with 3.0 x 28 Promus DES to mLAD   Depression    Diabetic neuropathy ([place])    Elbow injury 07/25/2017   Surgery for torn tendon   Essential hypertension    Fatty liver disease, nonalcoholic    GERD (gastroesophageal reflux disease)    Iron deficiency anemia 08/15/2021   Lateral epicondylitis of right elbow    Mixed hyperlipidemia    Obesity    Osgood-Schlatter's disease of right knee    Skin cyst 10/03/2017   Removed from back   Type 2 diabetes mellitus ([place])     Past Surgical History:  Procedure Laterality Date   BIOPSY  01/21/2019   Procedure: BIOPSY;  Surgeon: [name], MD;  [facility];  Service: Endoscopy;;  gastric bx and gastric polyps   BIOPSY  11/08/2020   Procedure: BIOPSY;  Surgeon: [name], MD;  [facility];  Service: Gastroenterology;;   CARDIAC CATHETERIZATION  2011   CARDIAC CATHETERIZATION N/A 09/20/2015   Procedure: Left Heart Cath and Coronary Angiography;  Surgeon: [name], MD;  Location: [facility];  Service: Cardiovascular;  Laterality: N/A;   CHOLECYSTECTOMY  2003   COLONOSCOPY  06/19/2012   Procedure: COLONOSCOPY;  Surgeon: [name], MD;  [facility];   Service: Endoscopy;  Laterality: N/A;  930   COLONOSCOPY N/A 10/17/2017   Procedure: COLONOSCOPY;  Surgeon: [name], MD;  [facility];  Service: Endoscopy;  Laterality: N/A;  1225   COLONOSCOPY WITH PROPOFOL N/A 11/08/2020   Procedure: COLONOSCOPY WITH PROPOFOL;  Surgeon: [name], MD;  [facility];  Service: Gastroenterology;  Laterality: N/A;  10:45   COLONOSCOPY WITH PROPOFOL N/A 04/04/2021   Procedure: COLONOSCOPY WITH PROPOFOL;  Surgeon: [name], MD;  [facility];  Service: Gastroenterology;  Laterality: N/A;   CYST EXCISION  07/2019   ESOPHAGOGASTRODUODENOSCOPY (EGD) WITH PROPOFOL N/A 01/21/2019   Procedure: ESOPHAGOGASTRODUODENOSCOPY (EGD) WITH PROPOFOL;  Surgeon: [name], MD;  [facility];  Service: Endoscopy;  Laterality: N/A;  10:15am   ESOPHAGOGASTRODUODENOSCOPY (EGD) WITH PROPOFOL N/A 02/17/2021   Procedure: ESOPHAGOGASTRODUODENOSCOPY (EGD) WITH PROPOFOL;  Surgeon: [name], MD;  [facility];  Service: Gastroenterology;  Laterality: N/A;  10:20   ESOPHAGOGASTRODUODENOSCOPY (EGD) WITH PROPOFOL N/A 04/04/2021   Procedure: ESOPHAGOGASTRODUODENOSCOPY (EGD) WITH PROPOFOL;  Surgeon: [name], MD;  [facility];  Service: Gastroenterology;  Laterality: N/A;  12:30   GIVENS CAPSULE STUDY N/A 09/13/2021   Procedure: GIVENS CAPSULE STUDY;  Surgeon: [name], MD;  [facility];  Service: Gastroenterology;  Laterality: N/A;  7:30   INTRAVASCULAR PRESSURE WIRE/FFR STUDY  N/A 04/10/2022   Procedure: INTRAVASCULAR PRESSURE WIRE/FFR STUDY;  Surgeon: Belva Crome, MD;  Location: [facility];  Service: Cardiovascular;  Laterality: N/A;   KNEE ARTHROPLASTY Right 1999   LEFT HEART CATH AND CORONARY ANGIOGRAPHY N/A 02/26/2018   Procedure: LEFT HEART CATH AND CORONARY ANGIOGRAPHY;  Surgeon: [name], MD;   Location: [facility];  Service: Cardiovascular;  Laterality: N/A;   LEFT HEART CATH AND CORONARY ANGIOGRAPHY N/A 04/10/2022   Procedure: LEFT HEART CATH AND CORONARY ANGIOGRAPHY;  Surgeon: Belva Crome, MD;  Location: [facility];  Service: Cardiovascular;  Laterality: N/A;   LEFT HEART CATHETERIZATION WITH CORONARY ANGIOGRAM N/A 12/09/2013   Procedure: LEFT HEART CATHETERIZATION WITH CORONARY ANGIOGRAM;  Surgeon: [name], MD;  Location: [facility];  Service: Cardiovascular;  Laterality: N/A;   Liver biopsy     POLYPECTOMY  10/17/2017   Procedure: POLYPECTOMY;  Surgeon: [name], MD;  [facility];  Service: Endoscopy;;  ascending colon, splenic flexure,sigmoid x2   POLYPECTOMY  11/08/2020   Procedure: POLYPECTOMY;  Surgeon: [name], MD;  [facility];  Service: Gastroenterology;;   POLYPECTOMY  04/04/2021   Procedure: POLYPECTOMY;  Surgeon: [name], MD;  [facility];  Service: Gastroenterology;;   TENNIS ELBOW RELEASE/NIRSCHEL PROCEDURE Right 07/25/2017   Procedure: TENNIS ELBOW RELEASE and debriedment;  Surgeon: [name], MD;  Location: [facility];  Service: Orthopedics;  Laterality: Right;   TONSILLECTOMY  1970's    Current Outpatient Medications  Medication Sig Dispense Refill   acetaminophen (TYLENOL) 500 MG tablet Take 1,500 mg by mouth daily as needed for moderate pain.     albuterol (VENTOLIN HFA) 108 (90 Base) MCG/ACT inhaler INHALE 2 PUFFS INTO LUNGS EVERY 6 HOURS AS NEEDED FOR WHEEZING AND SHORTNESS OF BREATH. (Patient taking differently: Inhale 2 puffs into the lungs every 6 (six) hours as needed for shortness of breath or wheezing. INHALE 2 PUFFS INTO LUNGS EVERY 6 HOURS AS NEEDED FOR WHEEZING AND SHORTNESS OF BREATH.) 8.5 g 5   ALPRAZolam (XANAX) 1 MG tablet Take 1 tablet (1 mg total) by mouth 3 (three) times daily as needed for anxiety. 90 tablet 3   amLODipine (NORVASC) 5  MG tablet Take 1 tablet (5 mg total) by mouth daily. 90 tablet 3   aspirin 81 MG EC tablet Take 1 tablet (81 mg total) by mouth daily. 30 tablet 12   atorvastatin (LIPITOR) 40 MG tablet TAKE ONE TABLET (40MG TOTAL) BY MOUTH DAILY 90 tablet 1   B-D UF III MINI PEN NEEDLES 31G X 5 MM MISC USE UP TO 4 TIMES DAILY WITH INSULIN AS DIRECTED. 100 each 3   colestipol (COLESTID) 1 g tablet Take 2 tablets (2 g total) by mouth daily. Take 4 hours apart from other medication (Patient taking differently: Take 1 g by mouth daily as needed (diarrhea). Take 4 hours apart from other medication) 180 tablet 3   Continuous Blood Gluc Sensor (FREESTYLE LIBRE 14 DAY SENSOR) MISC Inject 1 each into the skin every 14 (fourteen) days. Use as directed. 2 each 2   Continuous Blood Gluc Sensor (FREESTYLE LIBRE 14 DAY SENSOR) MISC See admin instructions.     dapagliflozin propanediol (FARXIGA) 5 MG TABS tablet Take 5 mg by mouth daily.     ezetimibe (ZETIA) 10 MG tablet Take 1 tablet (10 mg total) by mouth daily. 30 tablet 5   fluticasone (FLONASE) 50 MCG/ACT  nasal spray Place 2 sprays into both nostrils daily. (Patient taking differently: Place 2 sprays into both nostrils daily as needed for allergies.) 16 g 0   fluticasone-salmeterol (ADVAIR HFA) 115-21 MCG/ACT inhaler INHALE 2 PUFFS INTO THE LUNGS TWICE DAILY (Patient taking differently: Inhale 2 puffs into the lungs 2 (two) times daily as needed (shortness of breath).) 12 g 1   gabapentin (NEURONTIN) 300 MG capsule 2 qam and 2qevening (Patient taking differently: Take 600 mg by mouth 2 (two) times daily.) 120 capsule 6   HUMALOG KWIKPEN 200 UNIT/ML KwikPen Inject 30-55 Units into the skin 3 (three) times daily before meals.     isosorbide dinitrate (ISORDIL) 30 MG tablet 1 tablet     Lancets MISC 1 each by Does not apply route 4 (four) times daily. 150 each 5   loratadine (CLARITIN) 10 MG tablet Take 10 mg by mouth daily as needed for allergies.     losartan (COZAAR) 25 MG  tablet TAKE ONE (1) TABLET BY MOUTH EVERY DAY 90 tablet 3   metFORMIN (GLUCOPHAGE-XR) 500 MG 24 hr tablet Take 1,000 mg by mouth 2 (two) times daily.     methocarbamol (ROBAXIN) 500 MG tablet Take 500 mg by mouth every 8 (eight) hours as needed for muscle spasms.     nitroGLYCERIN (NITROSTAT) 0.4 MG SL tablet Place 1 tablet (0.4 mg total) under the tongue every 5 (five) minutes as needed. 25 tablet 3   omeprazole (PRILOSEC) 40 MG capsule Take 1 capsule (40 mg total) by mouth daily. 90 capsule 3   ondansetron (ZOFRAN-ODT) 8 MG disintegrating tablet TAKE ONE TABLET (8MG TOTAL) BY MOUTH EVERY 8 HOURS AS NEEDED FRO NAUSEA OR VOMITING (Patient taking differently: Take 8 mg by mouth every 8 (eight) hours as needed for nausea or vomiting.) 30 tablet 5   propranolol (INDERAL) 20 MG tablet Take 1 tablet (20 mg total) by mouth 2 (two) times daily. 180 tablet 3   ranolazine (RANEXA) 1000 MG SR tablet TAKE ONE TABLET (1000MG TOTAL) BY MOUTH TWO TIMES DAILY 180 tablet 1   Semaglutide,0.25 or 0.5MG/DOS, (OZEMPIC, 0.25 OR 0.5 MG/DOSE,) 2 MG/3ML SOPN Inject 0.25 mg as directed every Friday.     sertraline (ZOLOFT) 100 MG tablet Take 1.5 tablets (150 mg total) by mouth daily. 135 tablet 3   Tetrahydrozoline HCl (VISINE OP) Place 1 drop into both eyes daily as needed (irritation).     TOUJEO MAX SOLOSTAR 300 UNIT/ML Solostar Pen INJECT 130 UNITS INTO THE SKIN AT BEDTIME. (Patient taking differently: Inject 50-55 Units into the skin at bedtime.) 12 mL 0   traZODone (DESYREL) 50 MG tablet Take 1 tablet (50 mg total) by mouth at bedtime as needed. 30 tablet 2   No current facility-administered medications for this visit.   Allergies:  Doxycycline   Social History: The patient  reports that he quit smoking about 38 years ago. His smoking use included cigarettes. He started smoking about 49 years ago. He has a 5.00 pack-year smoking history. He quit smokeless tobacco use about 38 years ago.  His smokeless tobacco use  included chew. He reports that he does not drink alcohol and does not use drugs.   Family History: The patient's family history includes CVA in his maternal grandfather and maternal grandmother; Cancer in his maternal aunt; Healthy in his son; Osteoporosis in his mother.   ROS:  Please see the history of present illness. Otherwise, complete review of systems is positive for {NONE DEFAULTED:18576}.  All other systems  are reviewed and negative.   Physical Exam: VS:  There were no vitals taken for this visit., BMI There is no height or weight on file to calculate BMI.  Wt Readings from Last 3 Encounters:  04/10/22 265 lb (120.2 kg)  04/02/22 268 lb 6.4 oz (121.7 kg)  03/06/22 263 lb 0.1 oz (119.3 kg)    General: Patient appears comfortable at rest. HEENT: Conjunctiva and lids normal, oropharynx clear with moist mucosa. Neck: Supple, no elevated JVP or carotid bruits, no thyromegaly. Lungs: Clear to auscultation, nonlabored breathing at rest. Cardiac: Regular rate and rhythm, no S3 or significant systolic murmur, no pericardial rub. Abdomen: Soft, nontender, no hepatomegaly, bowel sounds present, no guarding or rebound. Extremities: No pitting edema, distal pulses 2+. Skin: Warm and dry. Musculoskeletal: No kyphosis. Neuropsychiatric: Alert and oriented x3, affect grossly appropriate.  ECG:  An ECG dated 04/10/2022 was personally reviewed today and demonstrated:  Sinus rhythm with prolonged PR interval.  Recent Labwork: 02/07/2022: ALT 59; AST 36 04/04/2022: BUN 12; Creatinine, Ser 0.95; Potassium 3.8; Sodium 141 04/20/2022: Hemoglobin 16.6; Platelets 174     Component Value Date/Time   CHOL 194 02/07/2022 0938   TRIG 372 (H) 02/07/2022 0938   HDL 38 (L) 02/07/2022 0938   CHOLHDL 5.1 (H) 02/07/2022 0938   CHOLHDL 6.4 07/12/2018 0713   VLDL UNABLE TO CALCULATE IF TRIGLYCERIDE OVER 400 mg/dL 07/12/2018 0713   LDLCALC 94 02/07/2022 0938    Other Studies Reviewed Today:  Echocardiogram  03/07/2022:  1. Left ventricular ejection fraction, by estimation, is 60 to 65%. The  left ventricle has normal function. The left ventricle has no regional  wall motion abnormalities. There is mild concentric left ventricular  hypertrophy. Left ventricular diastolic  parameters are consistent with Grade I diastolic dysfunction (impaired  relaxation).   2. Right ventricular systolic function is normal. The right ventricular  size is normal. There is normal pulmonary artery systolic pressure. The  estimated right ventricular systolic pressure is 16.1 mmHg.   3. The mitral valve is normal in structure. No evidence of mitral valve  regurgitation. No evidence of mitral stenosis.   4. The aortic valve is tricuspid. Aortic valve regurgitation is not  visualized. No aortic stenosis is present.   5. Aortic dilatation noted. There is mild dilatation of the aortic root,  measuring 42 mm. There is mild dilatation of the ascending aorta,  measuring 41 mm.   Cardiac catheterization 04/10/2022: CONCLUSIONS: Widely patent epicardial coronaries as documented on prior coronary angiography several years ago. Angiographically nonobstructive 30 to 40% stenosis proximal to the LAD stent and 30 to 40% stenosis distal to the LAD stent. Normal LV function with normal EDP. Abnormal coronary physiologic study with elevated intramyocardial resistance (IMR) at 36 (nl < 25) and reduced reduced coronary flow reserve at 2.4.  These findings confirm microvascular dysfunction.   RECOMMENDATIONS:   Discontinue long-acting nitrates.  Use negative inotropic calcium channel blocker therapy and beta-blocker therapy as tolerated.  Aggressive lipid-lowering.  Cardiac rehab if tolerable. Okay to use sublingual nitroglycerin for episodes of prolonged pain if helpful.  Assessment and Plan:   Medication Adjustments/Labs and Tests Ordered: Current medicines are reviewed at length with the patient today.  Concerns regarding  medicines are outlined above.   Tests Ordered: No orders of the defined types were placed in this encounter.   Medication Changes: No orders of the defined types were placed in this encounter.   Disposition:  Follow up {follow up:15908}  Signed, Aloha Gell.  [name], MD, [facility] 04/30/2022 11:26 AM    [facility] at [facility] [address], [place], [place] [address] Phone: [phone]; Fax: [phone]

## 2022-05-01 ENCOUNTER — Ambulatory Visit: Payer: Medicare Other

## 2022-05-01 ENCOUNTER — Telehealth: Payer: Self-pay | Admitting: *Deleted

## 2022-05-01 NOTE — Telephone Encounter (Signed)
Patient called stating that he is not feeling well enough to come in for infusion.  Appointment rescheduled.

## 2022-05-07 ENCOUNTER — Inpatient Hospital Stay: Payer: Medicare Other

## 2022-05-07 VITALS — BP 109/73 | HR 79 | Temp 97.5°F | Resp 18

## 2022-05-07 DIAGNOSIS — D509 Iron deficiency anemia, unspecified: Secondary | ICD-10-CM

## 2022-05-07 MED ORDER — LORATADINE 10 MG PO TABS
10.0000 mg | ORAL_TABLET | Freq: Once | ORAL | Status: AC
  Administered 2022-05-07: 10 mg via ORAL
  Filled 2022-05-07: qty 1

## 2022-05-07 MED ORDER — SODIUM CHLORIDE 0.9 % IV SOLN: Freq: Once | INTRAVENOUS | Status: AC

## 2022-05-07 MED ORDER — SODIUM CHLORIDE 0.9 % IV SOLN
510.0000 mg | Freq: Once | INTRAVENOUS | Status: AC
  Administered 2022-05-07: 510 mg via INTRAVENOUS
  Filled 2022-05-07: qty 17

## 2022-05-07 MED ORDER — ACETAMINOPHEN 325 MG PO TABS
650.0000 mg | ORAL_TABLET | Freq: Once | ORAL | Status: AC
  Administered 2022-05-07: 650 mg via ORAL
  Filled 2022-05-07: qty 2

## 2022-05-07 NOTE — Progress Notes (Signed)
Patient tolerated iron infusion with no complaints voiced.  Peripheral IV site clean and dry with good blood return noted before and after infusion.  Band aid applied.  VSS with discharge and left in satisfactory condition with no s/s of distress noted.

## 2022-05-07 NOTE — Patient Instructions (Signed)
Jared Griffin  Discharge Instructions: Thank you for choosing [facility] to provide your oncology and hematology care.  If you have a lab appointment with the [facility], please come in thru the Main Entrance and check in at the main information desk.  Wear comfortable clothing and clothing appropriate for easy access to any Portacath or PICC line.   We strive to give you quality time with your provider. You may need to reschedule your appointment if you arrive late (15 or more minutes).  Arriving late affects you and other patients whose appointments are after yours.  Also, if you miss three or more appointments without notifying the office, you may be dismissed from the clinic at the provider's discretion.      For prescription refill requests, have your pharmacy contact our office and allow 72 hours for refills to be completed.    Today you received feraheme.  Ferumoxytol Injection What is this medication? FERUMOXYTOL (FER ue MOX i tol) treats low levels of iron in your body (iron deficiency anemia). Iron is a mineral that plays an important role in making red blood cells, which carry oxygen from your lungs to the rest of your body. This medicine may be used for other purposes; ask your health care provider or pharmacist if you have questions. COMMON BRAND NAME(S): Feraheme What should I tell my care team before I take this medication? They need to know if you have any of these conditions: Anemia not caused by low iron levels High levels of iron in the blood Magnetic resonance imaging (MRI) test scheduled An unusual or allergic reaction to iron, other medications, foods, dyes, or preservatives Pregnant or trying to get pregnant Breast-feeding How should I use this medication? This medication is for injection into a vein. It is given in a hospital or clinic setting. Talk to your care team the use of this medication in children. Special care may be  needed. Overdosage: If you think you have taken too much of this medicine contact a poison control center or emergency room at once. NOTE: This medicine is only for you. Do not share this medicine with others. What if I miss a dose? It is important not to miss your dose. Call your care team if you are unable to keep an appointment. What may interact with this medication? Other iron products This list may not describe all possible interactions. Give your health care provider a list of all the medicines, herbs, non-prescription drugs, or dietary supplements you use. Also tell them if you smoke, drink alcohol, or use illegal drugs. Some items may interact with your medicine. What should I watch for while using this medication? Visit your care team regularly. Tell your care team if your symptoms do not start to get better or if they get worse. You may need blood work done while you are taking this medication. You may need to follow a special diet. Talk to your care team. Foods that contain iron include: whole grains/cereals, dried fruits, beans, or peas, leafy green vegetables, and organ meats (liver, kidney). What side effects may I notice from receiving this medication? Side effects that you should report to your care team as soon as possible: Allergic reactions--skin rash, itching, hives, swelling of the face, lips, tongue, or throat Low blood pressure--dizziness, feeling faint or lightheaded, blurry vision Shortness of breath Side effects that usually do not require medical attention (report to your care team if they continue or are bothersome):  Flushing Headache Joint pain Muscle pain Nausea Pain, redness, or irritation at injection site This list may not describe all possible side effects. Call your doctor for medical advice about side effects. You may report side effects to FDA at 1-800-FDA-1088. Where should I keep my medication? This medication is given in a hospital or clinic and will  not be stored at home. NOTE: This sheet is a summary. It may not cover all possible information. If you have questions about this medicine, talk to your doctor, pharmacist, or health care provider.  2023 Elsevier/Gold Standard (2021-01-27 00:00:00)       To help prevent nausea and vomiting after your treatment, we encourage you to take your nausea medication as directed.  BELOW ARE SYMPTOMS THAT SHOULD BE REPORTED IMMEDIATELY: *FEVER GREATER THAN 100.4 F (38 C) OR HIGHER *CHILLS OR SWEATING *NAUSEA AND VOMITING THAT IS NOT CONTROLLED WITH YOUR NAUSEA MEDICATION *UNUSUAL SHORTNESS OF BREATH *UNUSUAL BRUISING OR BLEEDING *URINARY PROBLEMS (pain or burning when urinating, or frequent urination) *BOWEL PROBLEMS (unusual diarrhea, constipation, pain near the anus) TENDERNESS IN MOUTH AND THROAT WITH OR WITHOUT PRESENCE OF ULCERS (sore throat, sores in mouth, or a toothache) UNUSUAL RASH, SWELLING OR PAIN  UNUSUAL VAGINAL DISCHARGE OR ITCHING   Items with * indicate a potential emergency and should be followed up as soon as possible or go to the Emergency Department if any problems should occur.  Please show the CHEMOTHERAPY ALERT CARD or IMMUNOTHERAPY ALERT CARD at check-in to the Emergency Department and triage nurse.  Should you have questions after your visit or need to cancel or reschedule your appointment, please contact [facility] [phone]  and follow the prompts.  Office hours are 8:00 a.m. to 4:30 p.m. Monday - Friday. Please note that voicemails left after 4:00 p.m. may not be returned until the following business day.  We are closed weekends and major holidays. You have access to a nurse at all times for urgent questions. Please call the main number to the clinic [phone] and follow the prompts.  For any non-urgent questions, you may also contact your provider using MyChart. We now offer e-Visits for anyone 35 and older to request care online for  non-urgent symptoms. For details visit [email or web address].   Also download the MyChart app! Go to the app store, search "MyChart", open the app, select [facility], and log in with your MyChart username and password.  Masks are optional in the cancer centers. If you would like for your care team to wear a mask while they are taking care of you, please let them know. You may have one support person who is at least 60 years old accompany you for your appointments.

## 2022-05-09 ENCOUNTER — Other Ambulatory Visit (INDEPENDENT_AMBULATORY_CARE_PROVIDER_SITE_OTHER): Payer: Self-pay | Admitting: Gastroenterology

## 2022-05-17 ENCOUNTER — Encounter (HOSPITAL_COMMUNITY): Payer: Self-pay | Admitting: Emergency Medicine

## 2022-05-17 ENCOUNTER — Emergency Department (HOSPITAL_COMMUNITY): Payer: Medicare Other

## 2022-05-17 ENCOUNTER — Other Ambulatory Visit: Payer: Self-pay

## 2022-05-17 ENCOUNTER — Emergency Department (HOSPITAL_COMMUNITY)
Admission: EM | Admit: 2022-05-17 | Discharge: 2022-05-17 | Disposition: A | Discharge status: Home / Self Care | Payer: Medicare Other | Attending: Emergency Medicine | Admitting: Emergency Medicine

## 2022-05-17 DIAGNOSIS — R519 Headache, unspecified: Secondary | ICD-10-CM | POA: Insufficient documentation

## 2022-05-17 DIAGNOSIS — E860 Dehydration: Secondary | ICD-10-CM | POA: Insufficient documentation

## 2022-05-17 DIAGNOSIS — R55 Syncope and collapse: Secondary | ICD-10-CM | POA: Diagnosis not present

## 2022-05-17 DIAGNOSIS — Z7982 Long term (current) use of aspirin: Secondary | ICD-10-CM | POA: Insufficient documentation

## 2022-05-17 DIAGNOSIS — M549 Dorsalgia, unspecified: Secondary | ICD-10-CM | POA: Insufficient documentation

## 2022-05-17 DIAGNOSIS — D509 Iron deficiency anemia, unspecified: Secondary | ICD-10-CM | POA: Diagnosis not present

## 2022-05-17 LAB — CBC WITH DIFFERENTIAL/PLATELET
Abs Immature Granulocytes: 0.05 10*3/uL (ref 0.00–0.07)
Basophils Absolute: 0.1 10*3/uL (ref 0.0–0.1)
Basophils Relative: 1 %
Eosinophils Absolute: 0.2 10*3/uL (ref 0.0–0.5)
Eosinophils Relative: 2 %
HCT: 43.8 % (ref 39.0–52.0)
Hemoglobin: 15.1 g/dL (ref 13.0–17.0)
Immature Granulocytes: 1 %
Lymphocytes Relative: 37 %
Lymphs Abs: 3.1 10*3/uL (ref 0.7–4.0)
MCH: 30.8 pg (ref 26.0–34.0)
MCHC: 34.5 g/dL (ref 30.0–36.0)
MCV: 89.2 fL (ref 80.0–100.0)
Monocytes Absolute: 0.8 10*3/uL (ref 0.1–1.0)
Monocytes Relative: 10 %
Neutro Abs: 4.1 10*3/uL (ref 1.7–7.7)
Neutrophils Relative %: 49 %
Platelets: 149 10*3/uL — ABNORMAL LOW (ref 150–400)
RBC: 4.91 MIL/uL (ref 4.22–5.81)
RDW: 14.5 % (ref 11.5–15.5)
WBC: 8.4 10*3/uL (ref 4.0–10.5)
nRBC: 0 % (ref 0.0–0.2)

## 2022-05-17 LAB — COMPREHENSIVE METABOLIC PANEL
ALT: 55 U/L — ABNORMAL HIGH (ref 0–44)
AST: 40 U/L (ref 15–41)
Albumin: 4.1 g/dL (ref 3.5–5.0)
Alkaline Phosphatase: 46 U/L (ref 38–126)
Anion gap: 13 (ref 5–15)
BUN: 22 mg/dL — ABNORMAL HIGH (ref 6–20)
CO2: 22 mmol/L (ref 22–32)
Calcium: 9.1 mg/dL (ref 8.9–10.3)
Chloride: 106 mmol/L (ref 98–111)
Creatinine, Ser: 1.18 mg/dL (ref 0.61–1.24)
GFR, Estimated: >60 mL/min (ref >60)
Glucose, Bld: 96 mg/dL (ref 70–99)
Potassium: 3.7 mmol/L (ref 3.5–5.1)
Sodium: 141 mmol/L (ref 135–145)
Total Bilirubin: 1.3 mg/dL — ABNORMAL HIGH (ref 0.3–1.2)
Total Protein: 6.7 g/dL (ref 6.5–8.1)

## 2022-05-17 LAB — TROPONIN I (HIGH SENSITIVITY)
Troponin I (High Sensitivity): 2 ng/L (ref <18)
Troponin I (High Sensitivity): 2 ng/L (ref <18)

## 2022-05-17 LAB — MAGNESIUM: Magnesium: 1.7 mg/dL (ref 1.7–2.4)

## 2022-05-17 LAB — LIPASE, BLOOD: Lipase: 28 U/L (ref 11–51)

## 2022-05-17 LAB — CBG MONITORING, ED: Glucose-Capillary: 104 mg/dL — ABNORMAL HIGH (ref 70–99)

## 2022-05-17 MED ORDER — LACTATED RINGERS IV BOLUS
1000.0000 mL | Freq: Once | INTRAVENOUS | Status: AC
  Administered 2022-05-17: 1000 mL via INTRAVENOUS

## 2022-05-17 MED ORDER — FENTANYL CITRATE PF 50 MCG/ML IJ SOSY
50.0000 ug | PREFILLED_SYRINGE | Freq: Once | INTRAMUSCULAR | Status: AC
  Administered 2022-05-17: 50 ug via INTRAVENOUS
  Filled 2022-05-17: qty 1

## 2022-05-17 MED ORDER — KETOROLAC TROMETHAMINE 30 MG/ML IJ SOLN
15.0000 mg | Freq: Once | INTRAMUSCULAR | Status: AC
  Administered 2022-05-17: 15 mg via INTRAVENOUS
  Filled 2022-05-17: qty 1

## 2022-05-17 NOTE — ED Notes (Signed)
MD at bedside assessing patient. BP reading 88/53 after narcotic medication administration. Pt easy to arouse, no hypoxia. 1L bolus of LR ordered.

## 2022-05-17 NOTE — ED Triage Notes (Addendum)
Pt in from home via [facility] after syncopal fall, landing onto L knee and posterior head vs hardwood floor, takes no thinners. Pt states he was feeling his blood sugar drop, went to eat a snack, and shortly thereafter passed out, wife finding him down. CBG 91 on EMS arrival, BP 120/74 dropping to 104/70 sitting, then given 136m NS en route. Denies any cp presently, does state he felt feverish yesterday, reporting posterior HA. Also states he started Ozempic 2 wks ago, and has had GI symptoms, recently stopped d/t side effects. Pt also states he did a lot of yardwork today

## 2022-05-17 NOTE — ED Provider Notes (Signed)
[facility] EMERGENCY DEPARTMENT Provider Note   CSN: [id number] Arrival date & time: 05/17/22  0038     History  Chief Complaint  Patient presents with   Loss of Consciousness    Jared Griffin is a 61 y.o. male.  61 year old male who presents the ER today after syncopal episode.  Patient states that he members feel little bit off and thought his blood sugar might be low so he had a snack and then checked it it was above 100.  He then walked to the kitchen and this last thing he remembers.  He states he did get dizzy on the way.  Described it as feeling like everything was spinning.  At this point he has a little bit of head pain where he hit his head on the floor and then some midline back pain. States he drinks quite a bit of water but not eating as much as normal. Took two doses of ozempic, most recently 9 days ago but is going to stop because of side effects of abdominal pain, nausea, diarrhea.    Loss of Consciousness      Home Medications Prior to Admission medications   Medication Sig Start Date End Date Taking? Authorizing Provider  acetaminophen (TYLENOL) 500 MG tablet Take 1,500 mg by mouth daily as needed for moderate pain.    [provider]  albuterol (VENTOLIN HFA) 108 (90 Base) MCG/ACT inhaler INHALE 2 PUFFS INTO LUNGS EVERY 6 HOURS AS NEEDED FOR WHEEZING AND SHORTNESS OF BREATH. Patient taking differently: Inhale 2 puffs into the lungs every 6 (six) hours as needed for shortness of breath or wheezing. INHALE 2 PUFFS INTO LUNGS EVERY 6 HOURS AS NEEDED FOR WHEEZING AND SHORTNESS OF BREATH. 08/10/19   Mikey Kirschner, MD  ALPRAZolam Duanne Moron) 1 MG tablet Take 1 tablet (1 mg total) by mouth 3 (three) times daily as needed for anxiety. 04/05/22 04/05/23  Cloria Spring, MD  amLODipine (NORVASC) 5 MG tablet Take 1 tablet (5 mg total) by mouth daily. 04/11/22 04/06/23  Isaiah Serge, NP  aspirin 81 MG EC tablet Take 1 tablet (81 mg total) by mouth daily. 01/22/19    [name], MD  atorvastatin (LIPITOR) 40 MG tablet TAKE ONE TABLET (40MG TOTAL) BY MOUTH DAILY 01/22/22   Satira Sark, MD  B-D UF III MINI PEN NEEDLES 31G X 5 MM MISC USE UP TO 4 TIMES DAILY WITH INSULIN AS DIRECTED. 03/23/22   [name], MD  colestipol (COLESTID) 1 g tablet Take 2 tablets (2 g total) by mouth daily. Take 4 hours apart from other medication Patient taking differently: Take 1 g by mouth daily as needed (diarrhea). Take 4 hours apart from other medication 09/07/21   Montez Morita, Quillian Quince, MD  Continuous Blood Gluc Sensor (FREESTYLE LIBRE 14 DAY SENSOR) MISC Inject 1 each into the skin every 14 (fourteen) days. Use as directed. 12/24/19   Cassandria Anger, MD  Continuous Blood Gluc Sensor (FREESTYLE LIBRE 14 DAY SENSOR) MISC See admin instructions.    [provider]  dapagliflozin propanediol (FARXIGA) 5 MG TABS tablet Take 5 mg by mouth daily. 03/31/20   [provider]  ezetimibe (ZETIA) 10 MG tablet Take 1 tablet (10 mg total) by mouth daily. 02/09/22   [name], MD  fluticasone (FLONASE) 50 MCG/ACT nasal spray Place 2 sprays into both nostrils daily. Patient taking differently: Place 2 sprays into both nostrils daily as needed for allergies. 09/13/19  Wurst, [place], PA-C  fluticasone-salmeterol (ADVAIR HFA) 174-08 MCG/ACT inhaler INHALE 2 PUFFS INTO THE LUNGS TWICE DAILY Patient taking differently: Inhale 2 puffs into the lungs 2 (two) times daily as needed (shortness of breath). 09/20/21   [name], MD  gabapentin (NEURONTIN) 300 MG capsule 2 qam and 2qevening Patient taking differently: Take 600 mg by mouth 2 (two) times daily. 12/25/21   [name], MD  HUMALOG KWIKPEN 200 UNIT/ML KwikPen Inject 30-55 Units into the skin 3 (three) times daily before meals. 10/18/20   [provider]  isosorbide dinitrate (ISORDIL) 30 MG tablet 1 tablet    [provider]  Lancets MISC 1 each by Does not apply route 4  (four) times daily. 09/24/19   Cassandria Anger, MD  loratadine (CLARITIN) 10 MG tablet Take 10 mg by mouth daily as needed for allergies.    [provider]  losartan (COZAAR) 25 MG tablet TAKE ONE (1) TABLET BY MOUTH EVERY DAY 01/22/22   Satira Sark, MD  metFORMIN (GLUCOPHAGE-XR) 500 MG 24 hr tablet Take 1,000 mg by mouth 2 (two) times daily.    [provider]  methocarbamol (ROBAXIN) 500 MG tablet Take 500 mg by mouth every 8 (eight) hours as needed for muscle spasms.    [provider]  nitroGLYCERIN (NITROSTAT) 0.4 MG SL tablet Place 1 tablet (0.4 mg total) under the tongue every 5 (five) minutes as needed. 07/28/21   Satira Sark, MD  omeprazole (PRILOSEC) 40 MG capsule TAKE ONE CAPSULE (40MG TOTAL) BY MOUTH DAILY 05/09/22   Montez Morita, Quillian Quince, MD  ondansetron (ZOFRAN-ODT) 8 MG disintegrating tablet TAKE ONE TABLET (8MG TOTAL) BY MOUTH EVERY 8 HOURS AS NEEDED FRO NAUSEA OR VOMITING Patient taking differently: Take 8 mg by mouth every 8 (eight) hours as needed for nausea or vomiting. 10/30/21   [name], MD  propranolol (INDERAL) 20 MG tablet Take 1 tablet (20 mg total) by mouth 2 (two) times daily. 02/07/22   Tat, Eustace Quail, DO  ranolazine (RANEXA) 1000 MG SR tablet TAKE ONE TABLET (1000MG TOTAL) BY MOUTH TWO TIMES DAILY 08/23/21   Satira Sark, MD  Semaglutide,0.25 or 0.5MG/DOS, (OZEMPIC, 0.25 OR 0.5 MG/DOSE,) 2 MG/3ML SOPN Inject 0.25 mg as directed every Friday. 02/06/22   [provider]  sertraline (ZOLOFT) 100 MG tablet Take 1.5 tablets (150 mg total) by mouth daily. 04/05/22   Cloria Spring, MD  Tetrahydrozoline HCl (VISINE OP) Place 1 drop into both eyes daily as needed (irritation).    [provider]  TOUJEO MAX SOLOSTAR 300 UNIT/ML Solostar Pen INJECT 130 UNITS INTO THE SKIN AT BEDTIME. Patient taking differently: Inject 50-55 Units into the skin at bedtime. 04/11/20   Cassandria Anger, MD  traZODone  (DESYREL) 50 MG tablet Take 1 tablet (50 mg total) by mouth at bedtime as needed. 04/28/21   Cloria Spring, MD      Allergies    Doxycycline    Review of Systems   Review of Systems  Cardiovascular:  Positive for syncope.    Physical Exam Updated Vital Signs BP 109/72   Pulse 81   Temp 98.1 F (36.7 C) (Oral)   Resp 15   Wt 120.2 kg   SpO2 95%   BMI 34.02 kg/m  Physical Exam Vitals and nursing note reviewed.  Constitutional:      Appearance: He is well-developed.  HENT:     Head: Normocephalic and atraumatic.  Cardiovascular:  Rate and Rhythm: Normal rate.  Pulmonary:     Effort: Pulmonary effort is normal. No respiratory distress.  Abdominal:     General: There is no distension.     Palpations: There is no mass.     Tenderness: There is no abdominal tenderness.     Hernia: No hernia is present.  Musculoskeletal:        General: Normal range of motion.     Cervical back: Normal range of motion.  Neurological:     Mental Status: He is alert.     Comments: No altered mental status, able to give full seemingly accurate history.  Face is symmetric, EOM's intact, pupils equal and reactive, vision intact, tongue and uvula midline without deviation. Upper and Lower extremity motor 5/5, intact pain perception in distal extremities, 2+ reflexes in biceps, patella and achilles tendons. Able to perform finger to nose normal with both hands.      ED Results / Procedures / Treatments   Labs (all labs ordered are listed, but only abnormal results are displayed) Labs Reviewed  CBC WITH DIFFERENTIAL/PLATELET - Abnormal; Notable for the following components:      Result Value   Platelets 149 (*)    All other components within normal limits  COMPREHENSIVE METABOLIC PANEL - Abnormal; Notable for the following components:   BUN 22 (*)    ALT 55 (*)    Total Bilirubin 1.3 (*)    All other components within normal limits  CBG MONITORING, ED - Abnormal; Notable for the  following components:   Glucose-Capillary 104 (*)    All other components within normal limits  LIPASE, BLOOD  MAGNESIUM  TROPONIN I (HIGH SENSITIVITY)  TROPONIN I (HIGH SENSITIVITY)    EKG None  Radiology CT Head Wo Contrast  Result Date: 05/17/2022 CLINICAL DATA:  Recent syncopal episode with fall, initial encounter EXAM: CT HEAD WITHOUT CONTRAST TECHNIQUE: Contiguous axial images were obtained from the base of the skull through the vertex without intravenous contrast. RADIATION DOSE REDUCTION: This exam was performed according to the departmental dose-optimization program which includes automated exposure control, adjustment of the mA and/or kV according to patient size and/or use of iterative reconstruction technique. COMPARISON:  10/11/2021 FINDINGS: Brain: No evidence of acute infarction, hemorrhage, hydrocephalus, extra-axial collection or mass lesion/mass effect. Vascular: No hyperdense vessel or unexpected calcification. Skull: Normal. Negative for fracture or focal lesion. Sinuses/Orbits: No acute finding. Other: None. IMPRESSION: No acute intracranial abnormality noted. Electronically Signed   By: Inez Catalina M.D.   On: 05/17/2022 01:43   DG Chest 2 View  Result Date: 05/17/2022 CLINICAL DATA:  Recent syncopal episode EXAM: CHEST - 2 VIEW COMPARISON:  03/06/2022 FINDINGS: Cardiac shadow is enlarged but stable. The lungs are well aerated bilaterally. No focal infiltrate is seen. No bony abnormality is noted. IMPRESSION: No active cardiopulmonary disease. Electronically Signed   By: Inez Catalina M.D.   On: 05/17/2022 01:40    Procedures Procedures    Medications Ordered in ED Medications  lactated ringers bolus 1,000 mL (0 mLs Intravenous Stopped 05/17/22 0155)  ketorolac (TORADOL) 30 MG/ML injection 15 mg (15 mg Intravenous Given 05/17/22 0154)  fentaNYL (SUBLIMAZE) injection 50 mcg (50 mcg Intravenous Given 05/17/22 0154)  lactated ringers bolus 1,000 mL (0 mLs Intravenous  Stopped 05/17/22 0214)  lactated ringers bolus 1,000 mL (0 mLs Intravenous Stopped 05/17/22 0305)  lactated ringers bolus 1,000 mL (0 mLs Intravenous Stopped 05/17/22 0447)  lactated ringers bolus 1,000 mL (1,000 mLs Intravenous New  Bag/Given 05/17/22 0520)    ED Course/ Medical Decision Making/ A&P                           Medical Decision Making Amount and/or Complexity of Data Reviewed Labs: ordered. Radiology: ordered.  Risk Prescription drug management.   Syncopal episode. Seems hypovolemic at this time. Had 2L of fluid without urinary output and still orthostatic (symptomatic and BP itself) so another liter given and subsequently had dark urine. 2 more liters and BP stable, ambulates without difficulty or symptoms. Seems likely dehydrated, will rest and hydrate over next couple days. FU w/ PCP for further management and recheck.   Final Clinical Impression(s) / ED Diagnoses Final diagnoses:  Syncope, unspecified syncope type  Dehydration    Rx / DC Orders ED Discharge Orders     None         Wilsie Kern, Corene Cornea, MD 05/17/22 [phone]

## 2022-05-17 NOTE — ED Notes (Signed)
Patient transported to CT 

## 2022-05-17 NOTE — ED Notes (Signed)
Patient verbalizes understanding of discharge instructions. Opportunity for questioning and answers were provided. Armband removed by staff, pt discharged from ED. Ambulated out to lobby  

## 2022-05-17 NOTE — ED Notes (Signed)
Pt noted to be dizzy with orthostatic hypotension (85/56), not able to tolerate a walk at this time. Pt is tolerating water PO well

## 2022-05-22 ENCOUNTER — Ambulatory Visit: Payer: Medicare Other | Admitting: Family Medicine

## 2022-05-22 ENCOUNTER — Ambulatory Visit (INDEPENDENT_AMBULATORY_CARE_PROVIDER_SITE_OTHER): Payer: Medicare Other | Admitting: Family Medicine

## 2022-05-22 VITALS — BP 112/78 | HR 86 | Temp 97.4°F | Ht 74.0 in | Wt 268.0 lb

## 2022-05-22 DIAGNOSIS — R55 Syncope and collapse: Secondary | ICD-10-CM | POA: Diagnosis not present

## 2022-05-22 DIAGNOSIS — I10 Essential (primary) hypertension: Secondary | ICD-10-CM

## 2022-05-22 DIAGNOSIS — Z79899 Other long term (current) drug therapy: Secondary | ICD-10-CM

## 2022-05-22 DIAGNOSIS — R7401 Elevation of levels of liver transaminase levels: Secondary | ICD-10-CM | POA: Diagnosis not present

## 2022-05-22 DIAGNOSIS — Z125 Encounter for screening for malignant neoplasm of prostate: Secondary | ICD-10-CM | POA: Diagnosis not present

## 2022-05-22 DIAGNOSIS — E781 Pure hyperglyceridemia: Secondary | ICD-10-CM

## 2022-05-22 NOTE — Progress Notes (Signed)
   Subjective:    Patient ID: Jared Griffin, male    DOB: 1961-06-28, 61 y.o.   MRN: [id number]  HPI Follow up ER follow up syncope States had stomach cramping and diarrhea within 2 days of starting ozempic  Prescribed by endocrinology- has stopped medication  Patient had dizzy spell Passed out Ended up in ER treated for dehydration Doing better now Denies dizziness chest pain shortness of breath Did not tolerate GLP-1's ER notes were reviewed Labs were reviewed CAT scan reviewed I do not feel that his syncope was cardiac related More than likely orthostasis from dehydration We did discuss strategies to prevent this in the future   Review of Systems     Objective:   Physical Exam Lungs clear heart regular HEENT benign Extremities no edema Blood pressure pulse stable laying sitting standing but does drop some with standing      Assessment & Plan:  1. Syncope, unspecified syncope type Encouraged him to get adequate fluid intake and nutrition intake to avoid these spells in the future  We did discuss Farxiga benefit [place] [facility] but does require adequate fluid intake during hot weather days - Lipid panel - Hepatic Function Panel - Basic metabolic panel - PSA  2. Elevated transaminase level Repeat blood work to look at liver - Lipid panel - Hepatic Function Panel - Basic metabolic panel - PSA  3. Screening for prostate cancer PSA before his wellness in October - PSA  4. Essential hypertension, benign Blood pressure reasonable - Lipid panel - Hepatic Function Panel - Basic metabolic panel - PSA  5. Hypertriglyceridemia Lipid profile before next visit - Lipid panel - Hepatic Function Panel - Basic metabolic panel - PSA  6. High risk medication use Labs before next visit - Lipid panel - Hepatic Function Panel - Basic metabolic panel - PSA

## 2022-06-11 ENCOUNTER — Encounter (INDEPENDENT_AMBULATORY_CARE_PROVIDER_SITE_OTHER): Payer: Self-pay | Admitting: Gastroenterology

## 2022-06-18 ENCOUNTER — Ambulatory Visit (INDEPENDENT_AMBULATORY_CARE_PROVIDER_SITE_OTHER): Payer: Medicare Other | Admitting: Gastroenterology

## 2022-06-18 ENCOUNTER — Encounter: Payer: Self-pay | Admitting: Family Medicine

## 2022-06-20 ENCOUNTER — Other Ambulatory Visit: Payer: Self-pay | Admitting: Cardiology

## 2022-06-21 ENCOUNTER — Ambulatory Visit (INDEPENDENT_AMBULATORY_CARE_PROVIDER_SITE_OTHER): Payer: Medicare Other | Admitting: Gastroenterology

## 2022-06-26 ENCOUNTER — Ambulatory Visit (INDEPENDENT_AMBULATORY_CARE_PROVIDER_SITE_OTHER): Payer: Medicare Other | Admitting: Family Medicine

## 2022-06-26 ENCOUNTER — Encounter: Payer: Self-pay | Admitting: Family Medicine

## 2022-06-26 VITALS — BP 112/68 | Ht 73.0 in | Wt 271.0 lb

## 2022-06-26 DIAGNOSIS — Z0000 Encounter for general adult medical examination without abnormal findings: Secondary | ICD-10-CM

## 2022-06-26 DIAGNOSIS — Z23 Encounter for immunization: Secondary | ICD-10-CM

## 2022-06-26 MED ORDER — GABAPENTIN 300 MG PO CAPS
ORAL_CAPSULE | ORAL | 6 refills | Status: DC
Start: 2022-06-26 — End: 2023-02-14

## 2022-06-26 NOTE — Progress Notes (Signed)
   Subjective:    Patient ID: Jared Griffin, male    DOB: [birth year], 61 y.o.   MRN: [id number]  HPI AWV- Annual Wellness Visit  The patient was seen for their annual wellness visit. The patient's past medical history, surgical history, and family history were reviewed. Pertinent vaccines were reviewed ( tetanus, pneumonia, shingles, flu) The patient's medication list was reviewed and updated.  The height and weight were entered.  BMI recorded in electronic record elsewhere  Cognitive screening was completed. Outcome of Mini - Cog: Pass   Falls /depression screening electronically recorded within record elsewhere  Current tobacco usage:none (All patients who use tobacco were given written and verbal information on quitting)  Recent listing of emergency department/hospitalizations over the past year were reviewed.  current specialist the patient sees on a regular basis: GI; Dr.Newton for back, Dr.Tat for tremors, Dr.Pennington due to iron,;Dr.Ross behavioral health   Medicare annual wellness visit patient questionnaire was reviewed.  A written screening schedule for the patient for the next 5-10 years was given. Appropriate discussion of followup regarding next visit was discussed.      Review of Systems     Objective:   Physical Exam General-in no acute distress Eyes-no discharge Lungs-respiratory rate normal, CTA CV-no murmurs,RRR Extremities skin warm dry no edema Neuro grossly normal Behavior normal, alert  Diabetic foot exam normal      Assessment & Plan:  1. Need for vaccination Flu shot today - Flu Vaccine QUAD 6+ mos PF IM (Fluarix Quad PF) - Pneumococcal conjugate vaccine 20-valent (Prevnar 20)  2. Well adult exam Adult wellness-complete.wellness physical was conducted today. Importance of diet and exercise were discussed in detail.  Importance of stress reduction and healthy living were discussed.  In addition to this a discussion regarding  safety was also covered.  We also reviewed over immunizations and gave recommendations regarding current immunization needed for age.   In addition to this additional areas were also touched on including: Preventative health exams needed:  Colonoscopy 2025  Patient was advised yearly wellness exam   3. Encounter for subsequent annual wellness visit (AWV) in Medicare patient Mini cog passes Followed by behavioral health for mental health issues Doing well with diabetes no longer tolerates Ozempic Flu shot updated today Had a recent fall we discussed this

## 2022-06-27 LAB — HEPATIC FUNCTION PANEL
ALT: 43 IU/L (ref 0–44)
AST: 30 IU/L (ref 0–40)
Albumin: 4.7 g/dL (ref 3.8–4.9)
Alkaline Phosphatase: 46 IU/L (ref 44–121)
Bilirubin Total: 0.9 mg/dL (ref 0.0–1.2)
Bilirubin, Direct: 0.32 mg/dL (ref 0.00–0.40)
Total Protein: 6.7 g/dL (ref 6.0–8.5)

## 2022-06-27 LAB — LIPID PANEL
Chol/HDL Ratio: 3.5 ratio (ref 0.0–5.0)
Cholesterol, Total: 117 mg/dL (ref 100–199)
HDL: 33 mg/dL — ABNORMAL LOW (ref >39)
LDL Chol Calc (NIH): 46 mg/dL (ref 0–99)
Triglycerides: 238 mg/dL — ABNORMAL HIGH (ref 0–149)
VLDL Cholesterol Cal: 38 mg/dL (ref 5–40)

## 2022-06-27 LAB — BASIC METABOLIC PANEL
BUN/Creatinine Ratio: 14 (ref 10–24)
BUN: 15 mg/dL (ref 8–27)
CO2: 23 mmol/L (ref 20–29)
Calcium: 9.4 mg/dL (ref 8.6–10.2)
Chloride: 97 mmol/L (ref 96–106)
Creatinine, Ser: 1.06 mg/dL (ref 0.76–1.27)
Glucose: 209 mg/dL — ABNORMAL HIGH (ref 70–99)
Potassium: 4.5 mmol/L (ref 3.5–5.2)
Sodium: 139 mmol/L (ref 134–144)
eGFR: 80 mL/min/{1.73_m2} (ref >59)

## 2022-06-27 LAB — PSA: Prostate Specific Ag, Serum: 0.4 ng/mL (ref 0.0–4.0)

## 2022-06-30 IMAGING — CT CT ABD-PELV W/ CM
2 of 5 series · 17 of 46 positions shown, 19 images · IV contrast (Omnipaque or Isovue)
Comparison: 09/11/2016

CLINICAL DATA: Abdominal pain and nausea approximately 6 months.
Alternating diarrhea and constipation.

EXAM:
CT ABDOMEN AND PELVIS WITH CONTRAST
TECHNIQUE: Multidetector CT imaging of the abdomen and pelvis was performed
using the standard protocol following bolus administration of
intravenous contrast.
CONTRAST:  100mL OMNIPAQUE IOHEXOL 300 MG/ML  SOLN

[Series 2: axial st · axial · 0.91mm/px · z∈[-687,-177]mm · 14 of 116 slices shown, 16 images]
[im 7/116  soft-tissue]
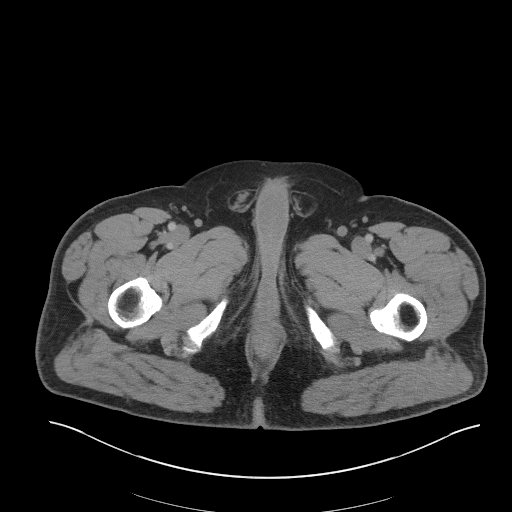
[im 7/116  bone]
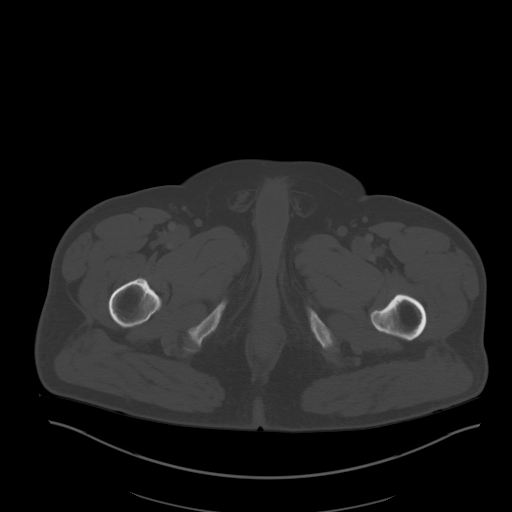
[im 13/116  soft-tissue]
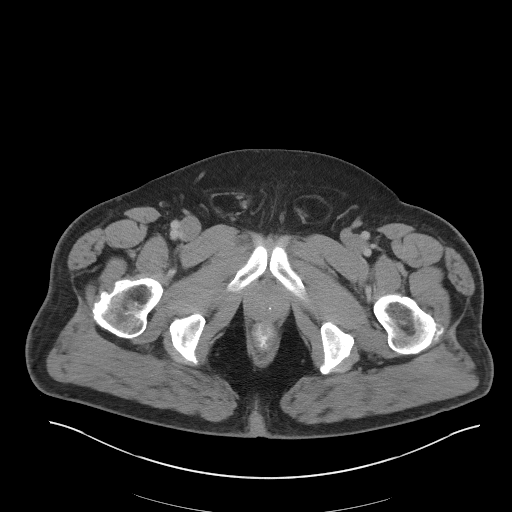
[im 26/116  soft-tissue]
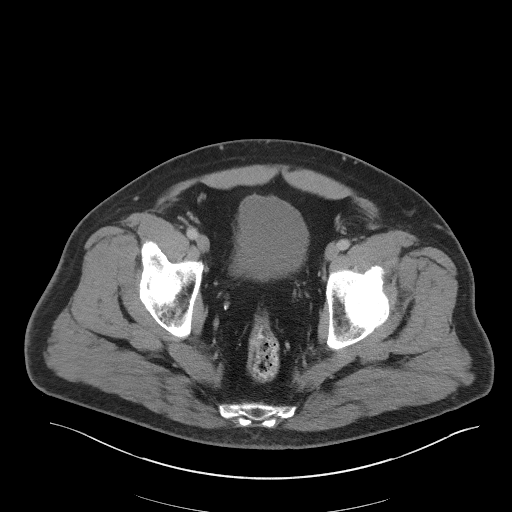
[im 32/116  soft-tissue]
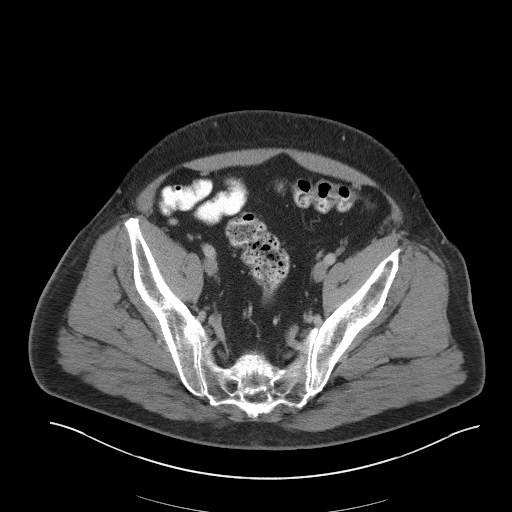
[im 39/116  soft-tissue]
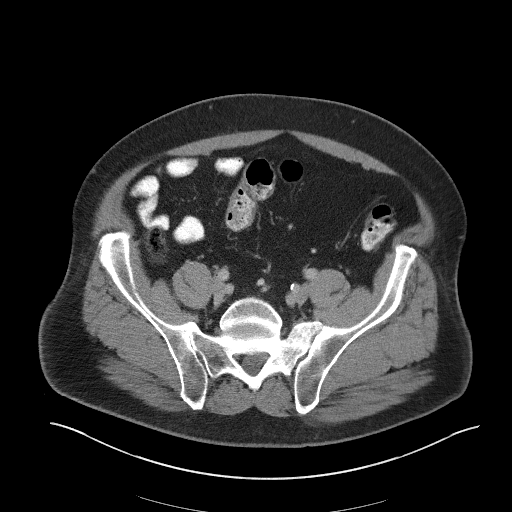
[im 45/116  soft-tissue]
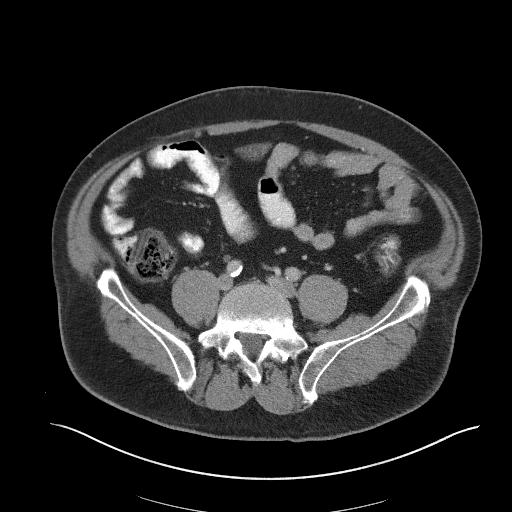
[im 52/116  soft-tissue]
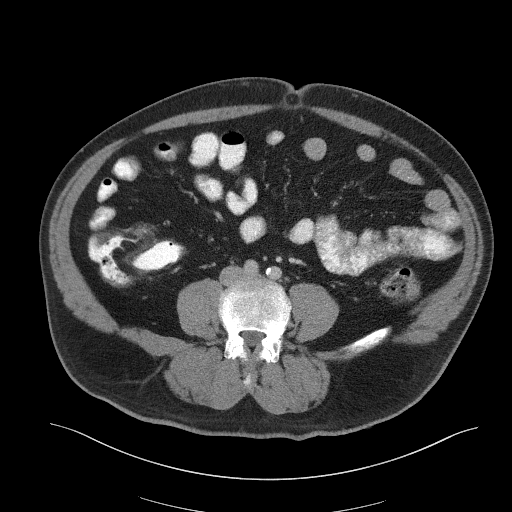
[im 64/116  soft-tissue]
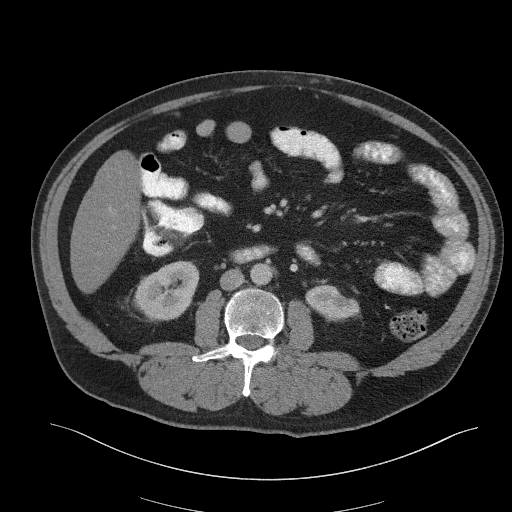
[im 71/116  soft-tissue]
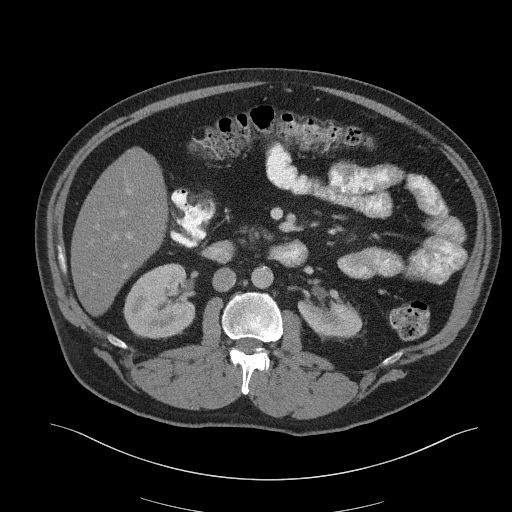
[im 71/116  bone]
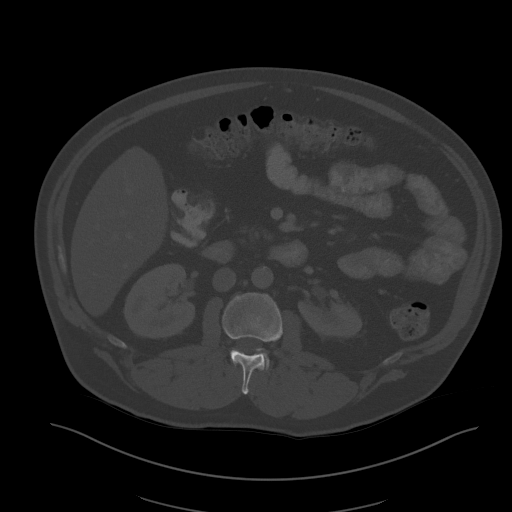
[im 77/116  soft-tissue]
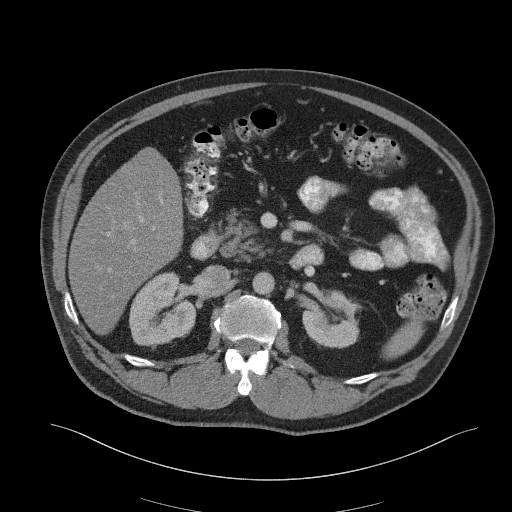
[im 84/116  soft-tissue]
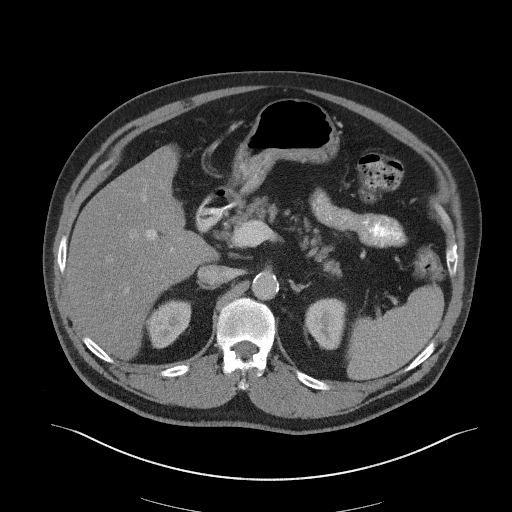
[im 90/116  soft-tissue]
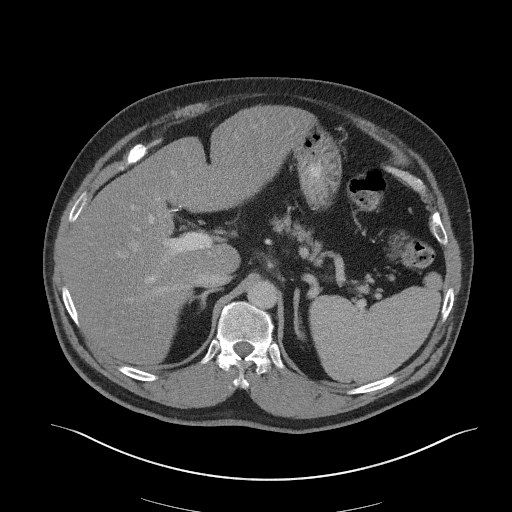
[im 103/116  soft-tissue]
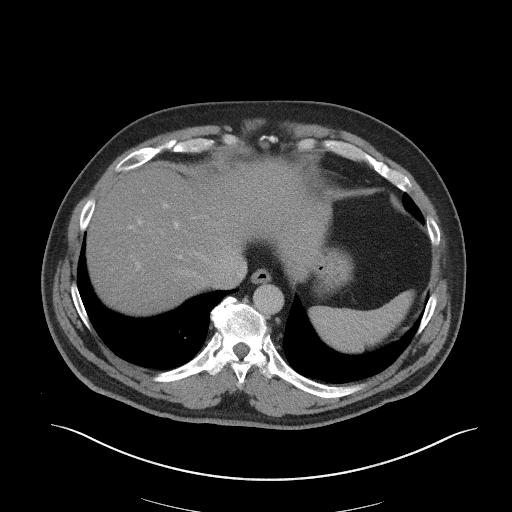
[im 109/116  soft-tissue]
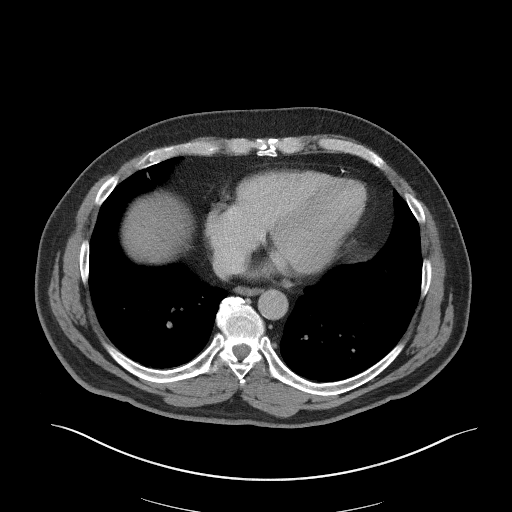

[Series 6: coronal st · coronal · 1.01mm/px · 3 of 117 slices shown]
[im 39/117  soft-tissue]
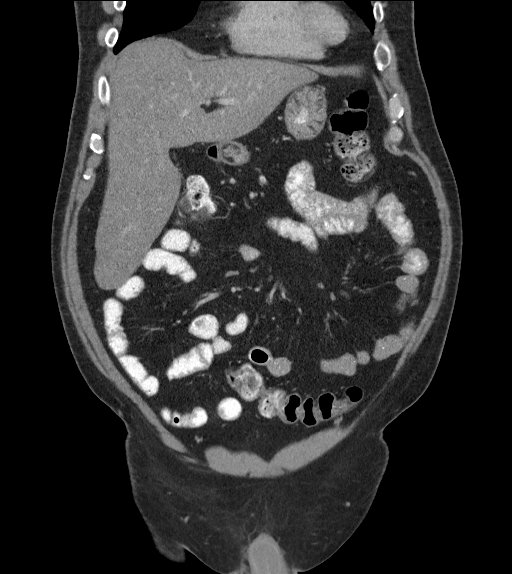
[im 52/117  soft-tissue]
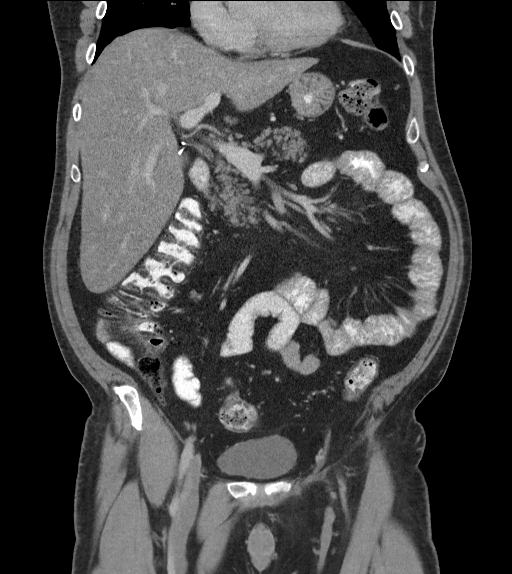
[im 65/117  soft-tissue]
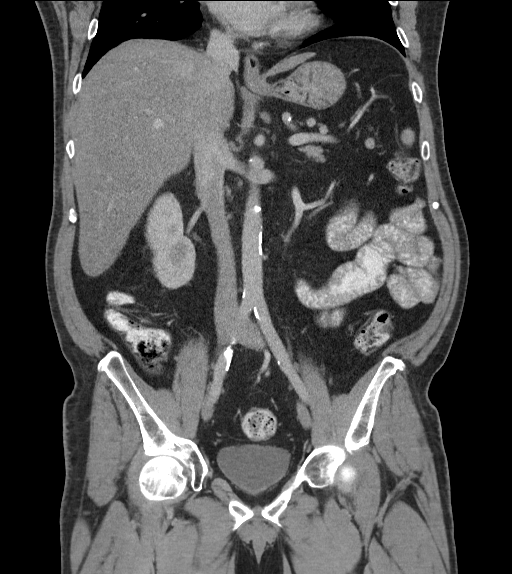

[17 of 46 positions shown; findings below may reference images not displayed]

FINDINGS: Lower Chest: No acute findings.

Hepatobiliary: Mild diffuse hepatic steatosis and hepatic are again
noted. No hepatic masses identified. Prior cholecystectomy. No
evidence of biliary obstruction.

Pancreas:  No mass or inflammatory changes.

Spleen: No significant change in mild splenomegaly. No splenic
masses identified.

Adrenals/Urinary Tract: No masses identified. Stable moderate left
renal parenchymal scarring. No evidence of ureteral calculi or
hydronephrosis.

Stomach/Bowel: No evidence of obstruction, inflammatory process or
abnormal fluid collections. Normal appendix visualized.

Vascular/Lymphatic: No pathologically enlarged lymph nodes. No
abdominal aortic aneurysm. Aortic atherosclerotic calcification
noted.

Reproductive:  No mass or other significant abnormality.

Other:  None.

Musculoskeletal:  No suspicious bone lesions identified.
IMPRESSION: No acute findings.

Stable hepatosplenomegaly.

Mild hepatic steatosis.

Aortic Atherosclerosis ([id number]).

## 2022-07-17 NOTE — Telephone Encounter (Signed)
No additional note

## 2022-07-28 ENCOUNTER — Encounter (INDEPENDENT_AMBULATORY_CARE_PROVIDER_SITE_OTHER): Payer: Self-pay | Admitting: Gastroenterology

## 2022-08-02 ENCOUNTER — Ambulatory Visit: Payer: Medicare Other | Admitting: Cardiology

## 2022-08-13 ENCOUNTER — Encounter (HOSPITAL_COMMUNITY): Payer: Self-pay | Admitting: Psychiatry

## 2022-08-13 ENCOUNTER — Telehealth (INDEPENDENT_AMBULATORY_CARE_PROVIDER_SITE_OTHER): Payer: Medicare Other | Admitting: Psychiatry

## 2022-08-13 DIAGNOSIS — F321 Major depressive disorder, single episode, moderate: Secondary | ICD-10-CM

## 2022-08-13 DIAGNOSIS — F411 Generalized anxiety disorder: Secondary | ICD-10-CM | POA: Diagnosis not present

## 2022-08-13 MED ORDER — SERTRALINE HCL 100 MG PO TABS: 150.0000 mg | ORAL_TABLET | Freq: Every day | ORAL | 3 refills | Status: DC

## 2022-08-13 MED ORDER — ALPRAZOLAM 1 MG PO TABS: 1.0000 mg | ORAL_TABLET | Freq: Three times a day (TID) | ORAL | 3 refills | Status: DC | PRN

## 2022-08-13 NOTE — Progress Notes (Signed)
Virtual Visit via Telephone Note  I connected with Jared Griffin on 08/13/22 at 11:00 AM EST by telephone and verified that I am speaking with the correct person using two identifiers.  Location: Patient: home Provider: office   I discussed the limitations, risks, security and privacy concerns of performing an evaluation and management service by telephone and the availability of in person appointments. I also discussed with the patient that there may be a patient responsible charge related to this service. The patient expressed understanding and agreed to proceed.      I discussed the assessment and treatment plan with the patient. The patient was provided an opportunity to ask questions and all were answered. The patient agreed with the plan and demonstrated an understanding of the instructions.   The patient was advised to call back or seek an in-person evaluation if the symptoms worsen or if the condition fails to improve as anticipated.  I provided 15 minutes of non-face-to-face time during this encounter.   [name], MD  [facility] MD/PA/NP OP Progress Note  08/13/2022 11:17 AM Jared Griffin  MRN:  [id number]  Chief Complaint:  Chief Complaint  Patient presents with   Depression   Anxiety   Follow-up   HPI:  This patient is a 61 year old married white male lives with his wife and  son in [place]. 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  Recently he has been working at [employer] zone but had an arm injury at work and had to quit there as well   The patient returns for follow-up after 4 months regarding his depression and anxiety.  Since I last saw him he has had a cardiac catheterization which went well.  However about a month later he suffered a orthostatic spell and passed out.  He was given several liters of fluid in the ER.  He has not had any problems since.  He denies significant depression or anxiety.  He  still does not sleep all that well but does not really want to try anything else.  The trazodone makes him fairly groggy so he is going to try using half a pill.  He denies any thoughts of self-harm or suicide    Diagnosis:    ICD-10-CM   1. Moderate single current episode of major depressive disorder ([place])  F32.1     2. Generalized anxiety disorder  F41.1       Past Psychiatric History: none  Past Medical History:  Past Medical History:  Diagnosis Date   Anxiety    COPD (chronic obstructive pulmonary disease) ([place])    Coronary atherosclerosis of native coronary artery    a. 12/09/2013: s/p PCI with 3.0 x 28 Promus DES to mLAD   Depression    Diabetic neuropathy ([place])    Elbow injury 07/25/2017   Surgery for torn tendon   Essential hypertension    Fatty liver disease, nonalcoholic    GERD (gastroesophageal reflux disease)    Iron deficiency anemia 08/15/2021   Lateral epicondylitis of right elbow    Mixed hyperlipidemia    Obesity    Osgood-Schlatter's disease of right knee    Skin cyst 10/03/2017   Removed from back   Type 2 diabetes mellitus ([place])     Past Surgical History:  Procedure Laterality Date   BIOPSY  01/21/2019   Procedure: BIOPSY;  Surgeon: [name], MD;  [facility];  Service: Endoscopy;;  gastric bx and gastric polyps   BIOPSY  11/08/2020   Procedure: BIOPSY;  Surgeon: [name], MD;  [facility];  Service: Gastroenterology;;   CARDIAC CATHETERIZATION  2011   CARDIAC CATHETERIZATION N/A 09/20/2015   Procedure: Left Heart Cath and Coronary Angiography;  Surgeon: [name], MD;  Location: [facility];  Service: Cardiovascular;  Laterality: N/A;   CHOLECYSTECTOMY  2003   COLONOSCOPY  06/19/2012   Procedure: COLONOSCOPY;  Surgeon: [name], MD;  [facility];  Service: Endoscopy;  Laterality: N/A;  930   COLONOSCOPY N/A 10/17/2017   Procedure: COLONOSCOPY;  Surgeon: [name], MD;  [facility];  Service: Endoscopy;  Laterality: N/A;  1225   COLONOSCOPY WITH PROPOFOL N/A 11/08/2020   Procedure: COLONOSCOPY WITH PROPOFOL;  Surgeon: [name], MD;  [facility];  Service: Gastroenterology;  Laterality: N/A;  10:45   COLONOSCOPY WITH PROPOFOL N/A 04/04/2021   Procedure: COLONOSCOPY WITH PROPOFOL;  Surgeon: [name], MD;  [facility];  Service: Gastroenterology;  Laterality: N/A;   CYST EXCISION  07/2019   ESOPHAGOGASTRODUODENOSCOPY (EGD) WITH PROPOFOL N/A 01/21/2019   Procedure: ESOPHAGOGASTRODUODENOSCOPY (EGD) WITH PROPOFOL;  Surgeon: [name], MD;  [facility];  Service: Endoscopy;  Laterality: N/A;  10:15am   ESOPHAGOGASTRODUODENOSCOPY (EGD) WITH PROPOFOL N/A 02/17/2021   Procedure: ESOPHAGOGASTRODUODENOSCOPY (EGD) WITH PROPOFOL;  Surgeon: [name], MD;  [facility];  Service: Gastroenterology;  Laterality: N/A;  10:20   ESOPHAGOGASTRODUODENOSCOPY (EGD) WITH PROPOFOL N/A 04/04/2021   Procedure: ESOPHAGOGASTRODUODENOSCOPY (EGD) WITH PROPOFOL;  Surgeon: [name], MD;  [facility];  Service: Gastroenterology;  Laterality: N/A;  12:30   GIVENS CAPSULE STUDY N/A 09/13/2021   Procedure: GIVENS CAPSULE STUDY;  Surgeon: [name], MD;  [facility];  Service: Gastroenterology;  Laterality: N/A;  7:30   INTRAVASCULAR PRESSURE WIRE/FFR STUDY N/A 04/10/2022   Procedure: INTRAVASCULAR PRESSURE WIRE/FFR STUDY;  Surgeon: Belva Crome, MD;  Location: [facility];  Service: Cardiovascular;  Laterality: N/A;   KNEE ARTHROPLASTY Right 1999   LEFT HEART CATH AND CORONARY ANGIOGRAPHY N/A 02/26/2018   Procedure: LEFT HEART CATH AND CORONARY ANGIOGRAPHY;  Surgeon: [name], MD;  Location: [place];  Service: Cardiovascular;  Laterality: N/A;   LEFT HEART CATH AND CORONARY ANGIOGRAPHY N/A  04/10/2022   Procedure: LEFT HEART CATH AND CORONARY ANGIOGRAPHY;  Surgeon: Belva Crome, MD;  Location: [facility];  Service: Cardiovascular;  Laterality: N/A;   LEFT HEART CATHETERIZATION WITH CORONARY ANGIOGRAM N/A 12/09/2013   Procedure: LEFT HEART CATHETERIZATION WITH CORONARY ANGIOGRAM;  Surgeon: [name], MD;  Location: [facility] LAB;  Service: Cardiovascular;  Laterality: N/A;   Liver biopsy     POLYPECTOMY  10/17/2017   Procedure: POLYPECTOMY;  Surgeon: [name], MD;  [facility];  Service: Endoscopy;;  ascending colon, splenic flexure,sigmoid x2   POLYPECTOMY  11/08/2020   Procedure: POLYPECTOMY;  Surgeon: [name], MD;  [facility];  Service: Gastroenterology;;   POLYPECTOMY  04/04/2021   Procedure: POLYPECTOMY;  Surgeon: [name], MD;  [facility];  Service: Gastroenterology;;   TENNIS ELBOW RELEASE/NIRSCHEL PROCEDURE Right 07/25/2017   Procedure: TENNIS ELBOW RELEASE and debriedment;  Surgeon: [name], MD;  Location: [facility];  Service: Orthopedics;  Laterality: Right;   TONSILLECTOMY  1970's  Family Psychiatric History: See below  Family History:  Family History  Problem Relation Age of Onset   Osteoporosis Mother    Cancer Maternal Aunt    CVA Maternal Grandmother    CVA Maternal Grandfather    Healthy Son     Social History:  Social History   Socioeconomic History   Marital status: Married    Spouse name: [name]   Number of children: 1   Years of education: Not on file   Highest education level: Not on file  Occupational History   Not on file  Tobacco Use   Smoking status: Former    Packs/day: 0.50    Years: 10.00    Total pack years: 5.00    Types: Cigarettes    Start date: 02/02/1973    Quit date: 09/17/1983    Years since quitting: 38.9   Smokeless tobacco: Former    Types: Chew    Quit date: 09/17/1983  Vaping Use   Vaping Use: Never used   Substance and Sexual Activity   Alcohol use: No    Alcohol/week: 0.0 standard drinks of alcohol   Drug use: No   Sexual activity: Yes  Other Topics Concern   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    Married with 1 child-son   Right handed    12 th    Very little caffeine   One story 12 stairs up to door and 8 steps to front door   Social Determinants of Health   Financial Resource Strain: Low Risk  (08/08/2021)   Overall Financial Resource Strain (CARDIA)    Difficulty of Paying Living Expenses: Not hard at all  Food Insecurity: No Food Insecurity (08/08/2021)   Hunger Vital Sign    Worried About Running Out of Food in the Last Year: Never true    Ran Out of Food in the Last Year: Never true  Transportation Needs: No Transportation Needs (08/08/2021)   PRAPARE - Hydrologist (Medical): No    Lack of Transportation (Non-Medical): No  Physical Activity: Insufficiently Active (08/08/2021)   Exercise Vital Sign    Days of Exercise per Week: 3 days    Minutes of Exercise per Session: 30 min  Stress: No Stress Concern Present (08/08/2021)   [place]    Feeling of Stress : Not at all  Social Connections: [place] (08/08/2021)   Social Connection and Isolation Panel [NHANES]    Frequency of Communication with Friends and Family: More than three times a week    Frequency of Social Gatherings with Friends and Family: More than three times a week    Attends Religious Services: More than 4 times per year    Active Member of [employer] or Organizations: Yes    Attends Music therapist: More than 4 times per year    Marital Status: Married    Allergies:  Allergies  Allergen Reactions   Bydureon [Exenatide] Diarrhea    Severe diarrhea and nausea   Ozempic (0.25 Or 0.5 Mg-Dose)  [Semaglutide(0.25 Or 0.70m-Dos)] Diarrhea    Severe nausea and diarrhea   Doxycycline     Severe esophagitis    Metabolic Disorder Labs: Lab Results  Component Value Date   HGBA1C 7.5 (H) 02/07/2022   MPG 211.6 07/12/2018   MPG 197.25 07/23/2017   No results found  for: "PROLACTIN" Lab Results  Component Value Date   CHOL 117 06/26/2022   TRIG 238 (H) 06/26/2022   HDL 33 (L) 06/26/2022   CHOLHDL 3.5 06/26/2022   VLDL UNABLE TO CALCULATE IF TRIGLYCERIDE OVER 400 mg/dL 07/12/2018   LDLCALC 46 06/26/2022   LDLCALC 94 02/07/2022   Lab Results  Component Value Date   TSH 0.648 08/20/2019   TSH 0.65 08/20/2019    Therapeutic Level Labs: No results found for: "LITHIUM" No results found for: "VALPROATE" No results found for: "CBMZ"  Current Medications: Current Outpatient Medications  Medication Sig Dispense Refill   acetaminophen (TYLENOL) 500 MG tablet Take 1,500 mg by mouth daily as needed for moderate pain.     albuterol (VENTOLIN HFA) 108 (90 Base) MCG/ACT inhaler INHALE 2 PUFFS INTO LUNGS EVERY 6 HOURS AS NEEDED FOR WHEEZING AND SHORTNESS OF BREATH. (Patient taking differently: Inhale 2 puffs into the lungs every 6 (six) hours as needed for shortness of breath or wheezing. INHALE 2 PUFFS INTO LUNGS EVERY 6 HOURS AS NEEDED FOR WHEEZING AND SHORTNESS OF BREATH.) 8.5 g 5   ALPRAZolam (XANAX) 1 MG tablet Take 1 tablet (1 mg total) by mouth 3 (three) times daily as needed for anxiety. 90 tablet 3   amLODipine (NORVASC) 5 MG tablet Take 1 tablet (5 mg total) by mouth daily. 90 tablet 3   aspirin 81 MG EC tablet Take 1 tablet (81 mg total) by mouth daily. 30 tablet 12   atorvastatin (LIPITOR) 40 MG tablet TAKE ONE TABLET (40MG TOTAL) BY MOUTH DAILY 90 tablet 1   B-D UF III MINI PEN NEEDLES 31G X 5 MM MISC USE UP TO 4 TIMES DAILY WITH INSULIN AS DIRECTED. 100 each 3   colestipol (COLESTID) 1 g tablet Take 2 tablets (2 g total) by mouth daily. Take 4 hours apart from other medication  (Patient taking differently: Take 1 g by mouth daily as needed (diarrhea). Take 4 hours apart from other medication) 180 tablet 3   Continuous Blood Gluc Sensor (FREESTYLE LIBRE 14 DAY SENSOR) MISC Inject 1 each into the skin every 14 (fourteen) days. Use as directed. 2 each 2   Continuous Blood Gluc Sensor (FREESTYLE LIBRE 14 DAY SENSOR) MISC See admin instructions.     dapagliflozin propanediol (FARXIGA) 5 MG TABS tablet Take 5 mg by mouth daily.     ezetimibe (ZETIA) 10 MG tablet Take 1 tablet (10 mg total) by mouth daily. 30 tablet 5   fluticasone (FLONASE) 50 MCG/ACT nasal spray Place 2 sprays into both nostrils daily. (Patient taking differently: Place 2 sprays into both nostrils daily as needed for allergies.) 16 g 0   fluticasone-salmeterol (ADVAIR HFA) 115-21 MCG/ACT inhaler INHALE 2 PUFFS INTO THE LUNGS TWICE DAILY (Patient taking differently: Inhale 2 puffs into the lungs 2 (two) times daily as needed (shortness of breath).) 12 g 1   gabapentin (NEURONTIN) 300 MG capsule 2 qam and 2qevening 120 capsule 6   HUMALOG KWIKPEN 200 UNIT/ML KwikPen Inject 30-55 Units into the skin 3 (three) times daily before meals.     isosorbide dinitrate (ISORDIL) 30 MG tablet 1 tablet     Lancets MISC 1 each by Does not apply route 4 (four) times daily. 150 each 5   loratadine (CLARITIN) 10 MG tablet Take 10 mg by mouth daily as needed for allergies.     losartan (COZAAR) 25 MG tablet TAKE ONE (1) TABLET BY MOUTH EVERY DAY 90 tablet 3   metFORMIN (GLUCOPHAGE-XR) 500 MG  24 hr tablet Take 1,000 mg by mouth 2 (two) times daily.     methocarbamol (ROBAXIN) 500 MG tablet Take 500 mg by mouth every 8 (eight) hours as needed for muscle spasms.     nitroGLYCERIN (NITROSTAT) 0.4 MG SL tablet Place 1 tablet (0.4 mg total) under the tongue every 5 (five) minutes as needed. 25 tablet 3   omeprazole (PRILOSEC) 40 MG capsule TAKE ONE CAPSULE (40MG TOTAL) BY MOUTH DAILY 90 capsule 3   ondansetron (ZOFRAN-ODT) 8 MG  disintegrating tablet TAKE ONE TABLET (8MG TOTAL) BY MOUTH EVERY 8 HOURS AS NEEDED FRO NAUSEA OR VOMITING (Patient taking differently: Take 8 mg by mouth every 8 (eight) hours as needed for nausea or vomiting.) 30 tablet 5   propranolol (INDERAL) 20 MG tablet Take 1 tablet (20 mg total) by mouth 2 (two) times daily. 180 tablet 3   ranolazine (RANEXA) 1000 MG SR tablet TAKE ONE TABLET (1000MG TOTAL) BY MOUTH TWO TIMES DAILY 180 tablet 1   sertraline (ZOLOFT) 100 MG tablet Take 1.5 tablets (150 mg total) by mouth daily. 135 tablet 3   TOUJEO MAX SOLOSTAR 300 UNIT/ML Solostar Pen INJECT 130 UNITS INTO THE SKIN AT BEDTIME. (Patient taking differently: Inject 50-55 Units into the skin at bedtime.) 12 mL 0   traZODone (DESYREL) 50 MG tablet Take 1 tablet (50 mg total) by mouth at bedtime as needed. 30 tablet 2   No current facility-administered medications for this visit.     Musculoskeletal: Strength & Muscle Tone: na Gait & Station: na Patient leans: N/A  Psychiatric Specialty Exam: Review of Systems  Musculoskeletal:  Positive for arthralgias and back pain.  All other systems reviewed and are negative.   There were no vitals taken for this visit.There is no height or weight on file to calculate BMI.  General Appearance: NA  Eye Contact:  NA  Speech:  Clear and Coherent  Volume:  Normal  Mood:  Euthymic  Affect:  NA  Thought Process:  Goal Directed  Orientation:  Full (Time, Place, and Person)  Thought Content: WDL   Suicidal Thoughts:  No  Homicidal Thoughts:  No  Memory:  Immediate;   Good Recent;   Good Remote;   Good  Judgement:  Good  Insight:  Good  Psychomotor Activity:  Normal  Concentration:  Concentration: Good and Attention Span: Good  Recall:  Good  Fund of Knowledge: Good  Language: Good  Akathisia:  No  Handed:  Right  AIMS (if indicated): not done  Assets:  Communication Skills Desire for Improvement Resilience Social Support Talents/Skills  ADL's:   Intact  Cognition: WNL  Sleep:  Fair   Screenings: GAD-7    Flowsheet Row Office Visit from 08/08/2020 in [place] Visit from 08/05/2018 in [place]  Total GAD-7 Score 3 5      PHQ2-9    [place] Office Visit from 05/22/2022 in [place] Video Visit from 04/05/2022 in [place] Video Visit from 12/04/2021 in [facility]- Video Visit from 09/05/2021 in [place] from 08/08/2021 in [place]  PHQ-2 Total Score 0 1 0 0 0  PHQ-9 Total Score 0 -- -- -- 0      Flowsheet Row ED from 05/17/2022 in [place] Video Visit from 04/05/2022 in [place] ED to Hosp-Admission (Discharged) from 03/06/2022 in [place]  RISK CATEGORY No Risk No Risk No Risk        Assessment and Plan: This patient is a 61 year old male with a history of depression anxiety and chronic pain.  His mood seems to be stable.  He will continue Zoloft 150 mg daily for depression and Xanax 1 mg up to 3 times daily as needed for anxiety.  He occasionally uses trazodone 25 to 50 mg at bedtime for sleep.  He will return to see me in 4 months  Collaboration of Care: Collaboration of Care: Primary Care Provider AEB notes are shared with PCP through the epic system  Patient/Guardian was advised Release of Information must be obtained prior to any record release in order to collaborate their care with an outside provider. Patient/Guardian was advised if they have not already done so to contact the registration department to sign all necessary forms in order for [place] to release information regarding their care.   Consent: Patient/Guardian gives verbal consent for treatment and assignment of benefits for services provided  during this visit. Patient/Guardian expressed understanding and agreed to proceed.    [name], MD 08/13/2022, 11:17 AM

## 2022-08-17 ENCOUNTER — Encounter: Payer: Self-pay | Admitting: Emergency Medicine

## 2022-08-17 ENCOUNTER — Ambulatory Visit
Admission: EM | Admit: 2022-08-17 | Discharge: 2022-08-17 | Disposition: A | Discharge status: Home / Self Care | Payer: Medicare Other

## 2022-08-17 DIAGNOSIS — K047 Periapical abscess without sinus: Secondary | ICD-10-CM

## 2022-08-17 DIAGNOSIS — J029 Acute pharyngitis, unspecified: Secondary | ICD-10-CM

## 2022-08-17 DIAGNOSIS — J0100 Acute maxillary sinusitis, unspecified: Secondary | ICD-10-CM | POA: Diagnosis not present

## 2022-08-17 MED ORDER — CLINDAMYCIN HCL 300 MG PO CAPS: 300.0000 mg | ORAL_CAPSULE | Freq: Two times a day (BID) | ORAL | 0 refills | Status: DC

## 2022-08-17 MED ORDER — CHLORHEXIDINE GLUCONATE 0.12 % MT SOLN: 15.0000 mL | Freq: Two times a day (BID) | OROMUCOSAL | 0 refills | Status: DC

## 2022-08-17 NOTE — ED Triage Notes (Signed)
Upper tooth pain on left side.  States he has has drainage coming out of a hole that a tooth was pulled from.  Nasal congestion and sore throat on on left side.  Was placed on amoxicillin by dentist

## 2022-08-17 NOTE — ED Provider Notes (Signed)
[facility]    CSN: [id number] Arrival date & time: 08/17/22  0913      History   Chief Complaint No chief complaint on file.   HPI Jared Griffin is a 61 y.o. male.   Presenting today with left upper dental pain.  States he had a tooth pulled just before Thanksgiving about a week and a half ago and has been on Augmentin since then but states he is continuing to have worsening pain here and now throat swelling on the left side, left-sided lymphadenopathy and nasal congestion, cough.  Denies fever, chest pain, shortness of breath, abdominal pain, nausea vomiting or diarrhea.  States he is tolerating p.o. fluids but not comfortably swallowing solids at this time.    Past Medical History:  Diagnosis Date   Anxiety    COPD (chronic obstructive pulmonary disease) ([place])    Coronary atherosclerosis of native coronary artery    a. 12/09/2013: s/p PCI with 3.0 x 28 Promus DES to mLAD   Depression    Diabetic neuropathy ([place])    Elbow injury 07/25/2017   Surgery for torn tendon   Essential hypertension    Fatty liver disease, nonalcoholic    GERD (gastroesophageal reflux disease)    Iron deficiency anemia 08/15/2021   Lateral epicondylitis of right elbow    Mixed hyperlipidemia    Obesity    Osgood-Schlatter's disease of right knee    Skin cyst 10/03/2017   Removed from back   Type 2 diabetes mellitus [facility])     Patient Active Problem List   Diagnosis Date Noted   Atypical chest pain 03/06/2022   Diarrhea 12/14/2021   Iron deficiency anemia 08/15/2021   Orthostatic hypotension 12/07/2020   IBS (irritable bowel syndrome) 11/17/2020   Chronic diarrhea 08/17/2020   NASH (nonalcoholic steatohepatitis) 08/17/2020   Elevated LFTs 08/17/2020   Tremor of both hands 05/06/2020   Bilateral occipital neuralgia 05/06/2020   Abdominal pain, chronic, epigastric 01/13/2019   Anxiety 01/04/2019   Radicular pain of left lower extremity 12/22/2018   Moderate persistent  asthma 10/25/2018   Dizziness 07/12/2018   Nonintractable headache    Abnormal nuclear stress test    Aftercare following surgery 07/25/17 08/13/2017   Hx of colonic polyps 08/07/2017   Lateral epicondylitis of right elbow    Cervical nerve root impingement 12/09/2015   Generalized anxiety disorder 09/27/2015   Pain in the chest    Depression 01/24/2015   Esophageal reflux 01/24/2015   Preoperative cardiovascular examination 06/28/2014   DM type 2 causing vascular disease ([place])    Mixed hyperlipidemia    Obesity    Intermediate coronary syndrome ([place]) 12/09/2013   Lumbago 05/21/2012   Essential hypertension, benign 12/28/2009   CAD S/P LAD DES March 2015 12/28/2009   Hyperlipidemia LDL goal <70 11/25/2009   Accelerating angina ([place]) 11/25/2009   DM2 (diabetes mellitus, type 2) ([place]) 11/24/2009   ABDOMINAL PAIN, HX OF 11/24/2009    Past Surgical History:  Procedure Laterality Date   BIOPSY  01/21/2019   Procedure: BIOPSY;  Surgeon: [name], MD;  [facility];  Service: Endoscopy;;  gastric bx and gastric polyps   BIOPSY  11/08/2020   Procedure: BIOPSY;  Surgeon: [name], MD;  [facility];  Service: Gastroenterology;;   CARDIAC CATHETERIZATION  2011   CARDIAC CATHETERIZATION N/A 09/20/2015   Procedure: Left Heart Cath and Coronary Angiography;  Surgeon: [name], MD;  Location: [place] [facility];  Service: Cardiovascular;  Laterality: N/A;   CHOLECYSTECTOMY  2003   COLONOSCOPY  06/19/2012   Procedure: COLONOSCOPY;  Surgeon: [name], MD;  [facility];  Service: Endoscopy;  Laterality: N/A;  930   COLONOSCOPY N/A 10/17/2017   Procedure: COLONOSCOPY;  Surgeon: [name], MD;  [facility];  Service: Endoscopy;  Laterality: N/A;  1225   COLONOSCOPY WITH PROPOFOL N/A 11/08/2020   Procedure: COLONOSCOPY WITH PROPOFOL;  Surgeon: [name], MD;  [facility];   Service: Gastroenterology;  Laterality: N/A;  10:45   COLONOSCOPY WITH PROPOFOL N/A 04/04/2021   Procedure: COLONOSCOPY WITH PROPOFOL;  Surgeon: [name], MD;  [facility];  Service: Gastroenterology;  Laterality: N/A;   CYST EXCISION  07/2019   ESOPHAGOGASTRODUODENOSCOPY (EGD) WITH PROPOFOL N/A 01/21/2019   Procedure: ESOPHAGOGASTRODUODENOSCOPY (EGD) WITH PROPOFOL;  Surgeon: [name], MD;  [facility];  Service: Endoscopy;  Laterality: N/A;  10:15am   ESOPHAGOGASTRODUODENOSCOPY (EGD) WITH PROPOFOL N/A 02/17/2021   Procedure: ESOPHAGOGASTRODUODENOSCOPY (EGD) WITH PROPOFOL;  Surgeon: [name], MD;  [facility];  Service: Gastroenterology;  Laterality: N/A;  10:20   ESOPHAGOGASTRODUODENOSCOPY (EGD) WITH PROPOFOL N/A 04/04/2021   Procedure: ESOPHAGOGASTRODUODENOSCOPY (EGD) WITH PROPOFOL;  Surgeon: [name], MD;  [facility];  Service: Gastroenterology;  Laterality: N/A;  12:30   GIVENS CAPSULE STUDY N/A 09/13/2021   Procedure: GIVENS CAPSULE STUDY;  Surgeon: [name], MD;  [facility];  Service: Gastroenterology;  Laterality: N/A;  7:30   INTRAVASCULAR PRESSURE WIRE/FFR STUDY N/A 04/10/2022   Procedure: INTRAVASCULAR PRESSURE WIRE/FFR STUDY;  Surgeon: Belva Crome, MD;  Location: [place] [facility];  Service: Cardiovascular;  Laterality: N/A;   KNEE ARTHROPLASTY Right 1999   LEFT HEART CATH AND CORONARY ANGIOGRAPHY N/A 02/26/2018   Procedure: LEFT HEART CATH AND CORONARY ANGIOGRAPHY;  Surgeon: [name], MD;  Location: [facility];  Service: Cardiovascular;  Laterality: N/A;   LEFT HEART CATH AND CORONARY ANGIOGRAPHY N/A 04/10/2022   Procedure: LEFT HEART CATH AND CORONARY ANGIOGRAPHY;  Surgeon: Belva Crome, MD;  Location: [place] [facility];  Service: Cardiovascular;  Laterality: N/A;   LEFT HEART CATHETERIZATION WITH CORONARY ANGIOGRAM N/A 12/09/2013    Procedure: LEFT HEART CATHETERIZATION WITH CORONARY ANGIOGRAM;  Surgeon: [name], MD;  Location: [facility];  Service: Cardiovascular;  Laterality: N/A;   Liver biopsy     POLYPECTOMY  10/17/2017   Procedure: POLYPECTOMY;  Surgeon: [name], MD;  [facility];  Service: Endoscopy;;  ascending colon, splenic flexure,sigmoid x2   POLYPECTOMY  11/08/2020   Procedure: POLYPECTOMY;  Surgeon: [name], MD;  [facility];  Service: Gastroenterology;;   POLYPECTOMY  04/04/2021   Procedure: POLYPECTOMY;  Surgeon: [name], MD;  [facility];  Service: Gastroenterology;;   TENNIS ELBOW RELEASE/NIRSCHEL PROCEDURE Right 07/25/2017   Procedure: TENNIS ELBOW RELEASE and debriedment;  Surgeon: [name], MD;  Location: [facility];  Service: Orthopedics;  Laterality: Right;   TONSILLECTOMY  1970's       Home Medications    Prior to Admission medications   Medication Sig Start Date End Date Taking? Authorizing Provider  amoxicillin (AMOXIL) 500 MG capsule Take 500 mg by mouth 3 (three) times daily.   Yes [provider]  chlorhexidine (PERIDEX) 0.12 % solution Use as directed 15 mLs in the mouth or throat  2 (two) times daily. 08/17/22  Yes Volney American, PA-C  clindamycin (CLEOCIN) 300 MG capsule Take 1 capsule (300 mg total) by mouth 2 (two) times daily. 08/17/22  Yes Volney American, PA-C  acetaminophen (TYLENOL) 500 MG tablet Take 1,500 mg by mouth daily as needed for moderate pain.    [provider]  albuterol (VENTOLIN HFA) 108 (90 Base) MCG/ACT inhaler INHALE 2 PUFFS INTO LUNGS EVERY 6 HOURS AS NEEDED FOR WHEEZING AND SHORTNESS OF BREATH. Patient taking differently: Inhale 2 puffs into the lungs every 6 (six) hours as needed for shortness of breath or wheezing. INHALE 2 PUFFS INTO LUNGS EVERY 6 HOURS AS NEEDED FOR WHEEZING AND SHORTNESS OF BREATH. 08/10/19   Mikey Kirschner,  MD  ALPRAZolam Duanne Moron) 1 MG tablet Take 1 tablet (1 mg total) by mouth 3 (three) times daily as needed for anxiety. 08/13/22 08/13/23  Cloria Spring, MD  amLODipine (NORVASC) 5 MG tablet Take 1 tablet (5 mg total) by mouth daily. 04/11/22 04/06/23  Isaiah Serge, NP  aspirin 81 MG EC tablet Take 1 tablet (81 mg total) by mouth daily. 01/22/19   [name], MD  atorvastatin (LIPITOR) 40 MG tablet TAKE ONE TABLET (40MG TOTAL) BY MOUTH DAILY 01/22/22   Satira Sark, MD  B-D UF III MINI PEN NEEDLES 31G X 5 MM MISC USE UP TO 4 TIMES DAILY WITH INSULIN AS DIRECTED. 03/23/22   [name], MD  colestipol (COLESTID) 1 g tablet Take 2 tablets (2 g total) by mouth daily. Take 4 hours apart from other medication Patient taking differently: Take 1 g by mouth daily as needed (diarrhea). Take 4 hours apart from other medication 09/07/21   Montez Morita, Quillian Quince, MD  Continuous Blood Gluc Sensor (FREESTYLE LIBRE 14 DAY SENSOR) MISC Inject 1 each into the skin every 14 (fourteen) days. Use as directed. 12/24/19   Cassandria Anger, MD  Continuous Blood Gluc Sensor (FREESTYLE LIBRE 14 DAY SENSOR) MISC See admin instructions.    [provider]  dapagliflozin propanediol (FARXIGA) 5 MG TABS tablet Take 5 mg by mouth daily. 03/31/20   [provider]  ezetimibe (ZETIA) 10 MG tablet Take 1 tablet (10 mg total) by mouth daily. 02/09/22   [name], MD  fluticasone (FLONASE) 50 MCG/ACT nasal spray Place 2 sprays into both nostrils daily. Patient taking differently: Place 2 sprays into both nostrils daily as needed for allergies. 09/13/19   Wurst, [place], PA-C  fluticasone-salmeterol (ADVAIR HFA) 379-02 MCG/ACT inhaler INHALE 2 PUFFS INTO THE LUNGS TWICE DAILY Patient taking differently: Inhale 2 puffs into the lungs 2 (two) times daily as needed (shortness of breath). 09/20/21   [name], MD  gabapentin (NEURONTIN) 300 MG capsule 2 qam and 2qevening 06/26/22   Luking, Elayne Snare,  MD  HUMALOG KWIKPEN 200 UNIT/ML KwikPen Inject 30-55 Units into the skin 3 (three) times daily before meals. 10/18/20   [provider]  isosorbide dinitrate (ISORDIL) 30 MG tablet 1 tablet    [provider]  Lancets MISC 1 each by Does not apply route 4 (four) times daily. 09/24/19   Cassandria Anger, MD  loratadine (CLARITIN) 10 MG tablet Take 10 mg by mouth daily as needed for allergies.    [provider]  losartan (COZAAR) 25 MG tablet TAKE ONE (1) TABLET BY MOUTH EVERY DAY 01/22/22   Satira Sark, MD  metFORMIN (GLUCOPHAGE-XR) 500 MG 24 hr tablet Take 1,000 mg by  mouth 2 (two) times daily.    [provider]  methocarbamol (ROBAXIN) 500 MG tablet Take 500 mg by mouth every 8 (eight) hours as needed for muscle spasms.    [provider]  nitroGLYCERIN (NITROSTAT) 0.4 MG SL tablet Place 1 tablet (0.4 mg total) under the tongue every 5 (five) minutes as needed. 07/28/21   Satira Sark, MD  omeprazole (PRILOSEC) 40 MG capsule TAKE ONE CAPSULE (40MG TOTAL) BY MOUTH DAILY 05/09/22   Montez Morita, Quillian Quince, MD  ondansetron (ZOFRAN-ODT) 8 MG disintegrating tablet TAKE ONE TABLET (8MG TOTAL) BY MOUTH EVERY 8 HOURS AS NEEDED FRO NAUSEA OR VOMITING Patient taking differently: Take 8 mg by mouth every 8 (eight) hours as needed for nausea or vomiting. 10/30/21   [name], MD  propranolol (INDERAL) 20 MG tablet Take 1 tablet (20 mg total) by mouth 2 (two) times daily. 02/07/22   Tat, Eustace Quail, DO  ranolazine (RANEXA) 1000 MG SR tablet TAKE ONE TABLET (1000MG TOTAL) BY MOUTH TWO TIMES DAILY 06/20/22   Satira Sark, MD  sertraline (ZOLOFT) 100 MG tablet Take 1.5 tablets (150 mg total) by mouth daily. 08/13/22   Cloria Spring, MD  TOUJEO MAX SOLOSTAR 300 UNIT/ML Solostar Pen INJECT 130 UNITS INTO THE SKIN AT BEDTIME. Patient taking differently: Inject 50-55 Units into the skin at bedtime. 04/11/20   Cassandria Anger, MD  traZODone  (DESYREL) 50 MG tablet Take 1 tablet (50 mg total) by mouth at bedtime as needed. 04/28/21   Cloria Spring, MD    Family History Family History  Problem Relation Age of Onset   Osteoporosis Mother    Cancer Maternal Aunt    CVA Maternal Grandmother    CVA Maternal Grandfather    Healthy Son     Social History Social History   Tobacco Use   Smoking status: Former    Packs/day: 0.50    Years: 10.00    Total pack years: 5.00    Types: Cigarettes    Start date: 02/02/1973    Quit date: 09/17/1983    Years since quitting: 38.9   Smokeless tobacco: Former    Types: Chew    Quit date: 09/17/1983  Vaping Use   Vaping Use: Never used  Substance Use Topics   Alcohol use: No    Alcohol/week: 0.0 standard drinks of alcohol   Drug use: No     Allergies   Bydureon [exenatide], Ozempic (0.25 or 0.5 mg-dose) [semaglutide(0.25 or 0.69m-dos)], and Doxycycline   Review of Systems Review of Systems PER HPI  Physical Exam Triage Vital Signs ED Triage Vitals  Enc Vitals Group     BP 08/17/22 0928 130/75     Pulse Rate 08/17/22 0928 96     Resp 08/17/22 0928 18     Temp 08/17/22 0928 98.9 F (37.2 C)     Temp Source 08/17/22 0928 Oral     SpO2 08/17/22 0928 94 %     Weight --      Height --      Head Circumference --      Peak Flow --      Pain Score 08/17/22 0929 6     Pain Loc --      Pain Edu? --      Excl. in [place] --    No data found.  Updated Vital Signs BP 130/75 (BP Location: Right Arm)   Pulse 96   Temp 98.9 F (37.2 C) (Oral)  Resp 18   SpO2 94%   Visual Acuity Right Eye Distance:   Left Eye Distance:   Bilateral Distance:    Right Eye Near:   Left Eye Near:    Bilateral Near:     Physical Exam Vitals and nursing note reviewed.  Constitutional:      Appearance: Normal appearance.  HENT:     Head: Atraumatic.     Nose: Congestion present.     Mouth/Throat:     Mouth: Mucous membranes are moist.     Pharynx: Posterior oropharyngeal erythema  present.     Comments: Left-sided posterior oropharyngeal erythema, exudates, injection.  Oral airway patent, uvula midline Eyes:     Extraocular Movements: Extraocular movements intact.     Conjunctiva/sclera: Conjunctivae normal.  Cardiovascular:     Rate and Rhythm: Normal rate and regular rhythm.  Pulmonary:     Effort: Pulmonary effort is normal.     Breath sounds: Normal breath sounds. No wheezing or rales.  Musculoskeletal:        General: Normal range of motion.     Cervical back: Normal range of motion and neck supple.  Lymphadenopathy:     Cervical: Cervical adenopathy (left focal) present.  Skin:    General: Skin is warm and dry.  Neurological:     General: No focal deficit present.     Mental Status: He is oriented to person, place, and time.  Psychiatric:        Mood and Affect: Mood normal.        Thought Content: Thought content normal.        Judgment: Judgment normal.      UC Treatments / Results  Labs (all labs ordered are listed, but only abnormal results are displayed) Labs Reviewed - No data to display  EKG   Radiology No results found.  Procedures Procedures (including critical care time)  Medications Ordered in UC Medications - No data to display  Initial Impression / Assessment and Plan / UC Course  I have reviewed the triage vital signs and the nursing notes.  Pertinent labs & imaging results that were available during my care of the patient were reviewed by me and considered in my medical decision making (see chart for details).     Not responding to the Augmentin he has been on for over a week now so we will switch to clindamycin for possible dental infection as well as apparent throat and or sinus infection.  Peridex rinse, over-the-counter pain relievers and close follow-up for worsening symptoms.  Final Clinical Impressions(s) / UC Diagnoses   Final diagnoses:  Acute pharyngitis, unspecified etiology  Acute maxillary sinusitis,  recurrence not specified  Dental infection     Discharge Instructions      With your dentist as soon as possible for a recheck.  Go to the emergency department if your symptoms continue to worsen.    ED Prescriptions     Medication Sig Dispense Auth. Provider   clindamycin (CLEOCIN) 300 MG capsule Take 1 capsule (300 mg total) by mouth 2 (two) times daily. 14 capsule Volney American, [place]   chlorhexidine (PERIDEX) 0.12 % solution Use as directed 15 mLs in the mouth or throat 2 (two) times daily. 120 mL Volney American, [place]      PDMP not reviewed this encounter.   Volney American, [place] 08/17/22 1057

## 2022-08-17 NOTE — Discharge Instructions (Signed)
With your dentist as soon as possible for a recheck.  Go to the emergency department if your symptoms continue to worsen.

## 2022-08-20 ENCOUNTER — Ambulatory Visit (INDEPENDENT_AMBULATORY_CARE_PROVIDER_SITE_OTHER): Payer: Medicare Other | Admitting: Gastroenterology

## 2022-08-21 ENCOUNTER — Other Ambulatory Visit: Payer: Self-pay | Admitting: Family Medicine

## 2022-08-27 ENCOUNTER — Ambulatory Visit
Admission: EM | Admit: 2022-08-27 | Discharge: 2022-08-27 | Disposition: A | Discharge status: Home / Self Care | Payer: Medicare Other | Attending: Internal Medicine | Admitting: Internal Medicine

## 2022-08-27 ENCOUNTER — Ambulatory Visit (INDEPENDENT_AMBULATORY_CARE_PROVIDER_SITE_OTHER): Payer: Medicare Other

## 2022-08-27 DIAGNOSIS — J069 Acute upper respiratory infection, unspecified: Secondary | ICD-10-CM | POA: Diagnosis not present

## 2022-08-27 DIAGNOSIS — R0602 Shortness of breath: Secondary | ICD-10-CM | POA: Diagnosis not present

## 2022-08-27 DIAGNOSIS — Z1152 Encounter for screening for COVID-19: Secondary | ICD-10-CM | POA: Insufficient documentation

## 2022-08-27 DIAGNOSIS — R059 Cough, unspecified: Secondary | ICD-10-CM

## 2022-08-27 DIAGNOSIS — Z20822 Contact with and (suspected) exposure to covid-19: Secondary | ICD-10-CM | POA: Insufficient documentation

## 2022-08-27 DIAGNOSIS — J029 Acute pharyngitis, unspecified: Secondary | ICD-10-CM

## 2022-08-27 DIAGNOSIS — R093 Abnormal sputum: Secondary | ICD-10-CM

## 2022-08-27 LAB — RESP PANEL BY RT-PCR (FLU A&B, COVID) ARPGX2
Influenza A by PCR: NEGATIVE
Influenza B by PCR: NEGATIVE
SARS Coronavirus 2 by RT PCR: POSITIVE — AB

## 2022-08-27 LAB — POCT FASTING CBG KUC MANUAL ENTRY: POCT Glucose (KUC): 209 mg/dL — AB (ref 70–99)

## 2022-08-27 MED ORDER — SODIUM CHLORIDE 0.9 % IV BOLUS
1000.0000 mL | Freq: Once | INTRAVENOUS | Status: AC
  Administered 2022-08-27: 1000 mL via INTRAVENOUS

## 2022-08-27 MED ORDER — ACETAMINOPHEN 325 MG PO TABS
650.0000 mg | ORAL_TABLET | Freq: Once | ORAL | Status: AC
  Administered 2022-08-27: 650 mg via ORAL

## 2022-08-27 NOTE — ED Triage Notes (Signed)
Pt reports his son tested positive for covid but pt tested negative for covid Wednesday. Heavy breathing, thick yellow congestion being coughed up sore throat, left ear "feels like its going to burst". Severe sinus pressure. Body aches. Symptoms started Friday. Took tylenol and antibiotic for tooth but no relief.

## 2022-08-27 NOTE — Discharge Instructions (Addendum)
You have a viral upper respiratory infection.  Symptoms should improve over the next week to 10 days.  If you develop chest pain or shortness of breath, go to the emergency room.  Chest x-ray today does not show any pneumonia or active/acute cardiopulmonary disease.  We have tested you today for COVID-19 and influenza.  You will see the results in Mychart and we will call you with positive results.    Please stay home and isolate until you are aware of the results.    Some things that can make you feel better are: - Any Coricidin product (safe for heart) - Increased rest - Increasing fluid with water/sugar free electrolytes - Acetaminophen as needed for fever/pain - Salt water gargling, chloraseptic spray and throat lozenges - OTC guaifenesin (Mucinex) 600 mg twice daily for congestion - Saline sinus flushes or a neti pot - Humidifying the air

## 2022-08-27 NOTE — ED Provider Notes (Signed)
[facility]    CSN: [id number] Arrival date & time: 08/27/22  0856      History   Chief Complaint No chief complaint on file.   HPI TOA MIA is a 61 y.o. male.   Patient presents with 3 days of fever, congested cough, chest and nasal congestion, runny nose, postnasal drainage, sore throat, sinus pressure, headache, left ear pain, nausea without vomiting, decreased appetite, and fatigue.  He also endorses shortness of breath that has worsened from his baseline.  Denies chest pain or chest tightness, abdominal pain, diarrhea, and loss of taste or smell.  Reports his son recently tested positive for COVID-19 last week.  He taken at home COVID test over the weekend that was negative.  He has not had any of the COVID-19 vaccines.  Reports he has tested positive for COVID-19 before, however it was more than 3 months ago.  Has been taking Tylenol for symptoms which seems to help temporarily.  Patient reports he has a history of "mild" COPD.  Reports remote smoking history more than 40 years ago.  Patient also reports he is being treated for dental infection.  He is following closely with a dentist who recently switched him from clindamycin to Augmentin.  Reports that the tooth pain is improving.      Past Medical History:  Diagnosis Date   Anxiety    COPD (chronic obstructive pulmonary disease) ([place])    Coronary atherosclerosis of native coronary artery    a. 12/09/2013: s/p PCI with 3.0 x 28 Promus DES to mLAD   Depression    Diabetic neuropathy ([place])    Elbow injury 07/25/2017   Surgery for torn tendon   Essential hypertension    Fatty liver disease, nonalcoholic    GERD (gastroesophageal reflux disease)    Iron deficiency anemia 08/15/2021   Lateral epicondylitis of right elbow    Mixed hyperlipidemia    Obesity    Osgood-Schlatter's disease of right knee    Skin cyst 10/03/2017   Removed from back   Type 2 diabetes mellitus [facility])     Patient Active  Problem List   Diagnosis Date Noted   Atypical chest pain 03/06/2022   Diarrhea 12/14/2021   Iron deficiency anemia 08/15/2021   Orthostatic hypotension 12/07/2020   IBS (irritable bowel syndrome) 11/17/2020   Chronic diarrhea 08/17/2020   NASH (nonalcoholic steatohepatitis) 08/17/2020   Elevated LFTs 08/17/2020   Tremor of both hands 05/06/2020   Bilateral occipital neuralgia 05/06/2020   Abdominal pain, chronic, epigastric 01/13/2019   Anxiety 01/04/2019   Radicular pain of left lower extremity 12/22/2018   Moderate persistent asthma 10/25/2018   Dizziness 07/12/2018   Nonintractable headache    Abnormal nuclear stress test    Aftercare following surgery 07/25/17 08/13/2017   Hx of colonic polyps 08/07/2017   Lateral epicondylitis of right elbow    Cervical nerve root impingement 12/09/2015   Generalized anxiety disorder 09/27/2015   Pain in the chest    Depression 01/24/2015   Esophageal reflux 01/24/2015   Preoperative cardiovascular examination 06/28/2014   DM type 2 causing vascular disease ([place])    Mixed hyperlipidemia    Obesity    Intermediate coronary syndrome ([place]) 12/09/2013   Lumbago 05/21/2012   Essential hypertension, benign 12/28/2009   CAD S/P LAD DES March 2015 12/28/2009   Hyperlipidemia LDL goal <70 11/25/2009   Accelerating angina ([place]) 11/25/2009   DM2 (diabetes mellitus, type 2) ([place]) 11/24/2009   ABDOMINAL PAIN, HX OF  11/24/2009    Past Surgical History:  Procedure Laterality Date   BIOPSY  01/21/2019   Procedure: BIOPSY;  Surgeon: [name], MD;  [facility];  Service: Endoscopy;;  gastric bx and gastric polyps   BIOPSY  11/08/2020   Procedure: BIOPSY;  Surgeon: [name], MD;  [facility];  Service: Gastroenterology;;   CARDIAC CATHETERIZATION  2011   CARDIAC CATHETERIZATION N/A 09/20/2015   Procedure: Left Heart Cath and Coronary Angiography;  Surgeon: [name], MD;  Location: [place] [facility];  Service: Cardiovascular;  Laterality: N/A;   CHOLECYSTECTOMY  2003   COLONOSCOPY  06/19/2012   Procedure: COLONOSCOPY;  Surgeon: [name], MD;  [facility];  Service: Endoscopy;  Laterality: N/A;  930   COLONOSCOPY N/A 10/17/2017   Procedure: COLONOSCOPY;  Surgeon: [name], MD;  [facility];  Service: Endoscopy;  Laterality: N/A;  1225   COLONOSCOPY WITH PROPOFOL N/A 11/08/2020   Procedure: COLONOSCOPY WITH PROPOFOL;  Surgeon: [name], MD;  [facility];  Service: Gastroenterology;  Laterality: N/A;  10:45   COLONOSCOPY WITH PROPOFOL N/A 04/04/2021   Procedure: COLONOSCOPY WITH PROPOFOL;  Surgeon: [name], MD;  [facility];  Service: Gastroenterology;  Laterality: N/A;   CYST EXCISION  07/2019   ESOPHAGOGASTRODUODENOSCOPY (EGD) WITH PROPOFOL N/A 01/21/2019   Procedure: ESOPHAGOGASTRODUODENOSCOPY (EGD) WITH PROPOFOL;  Surgeon: [name], MD;  [facility];  Service: Endoscopy;  Laterality: N/A;  10:15am   ESOPHAGOGASTRODUODENOSCOPY (EGD) WITH PROPOFOL N/A 02/17/2021   Procedure: ESOPHAGOGASTRODUODENOSCOPY (EGD) WITH PROPOFOL;  Surgeon: [name], MD;  [facility];  Service: Gastroenterology;  Laterality: N/A;  10:20   ESOPHAGOGASTRODUODENOSCOPY (EGD) WITH PROPOFOL N/A 04/04/2021   Procedure: ESOPHAGOGASTRODUODENOSCOPY (EGD) WITH PROPOFOL;  Surgeon: [name], MD;  [facility];  Service: Gastroenterology;  Laterality: N/A;  12:30   GIVENS CAPSULE STUDY N/A 09/13/2021   Procedure: GIVENS CAPSULE STUDY;  Surgeon: [name], MD;  [facility];  Service: Gastroenterology;  Laterality: N/A;  7:30   INTRAVASCULAR PRESSURE WIRE/FFR STUDY N/A 04/10/2022   Procedure: INTRAVASCULAR PRESSURE WIRE/FFR STUDY;  Surgeon: Belva Crome, MD;  Location: [place] [facility];  Service: Cardiovascular;  Laterality:  N/A;   KNEE ARTHROPLASTY Right 1999   LEFT HEART CATH AND CORONARY ANGIOGRAPHY N/A 02/26/2018   Procedure: LEFT HEART CATH AND CORONARY ANGIOGRAPHY;  Surgeon: [name], MD;  Location: [place] [facility];  Service: Cardiovascular;  Laterality: N/A;   LEFT HEART CATH AND CORONARY ANGIOGRAPHY N/A 04/10/2022   Procedure: LEFT HEART CATH AND CORONARY ANGIOGRAPHY;  Surgeon: Belva Crome, MD;  Location: [place] [facility];  Service: Cardiovascular;  Laterality: N/A;   LEFT HEART CATHETERIZATION WITH CORONARY ANGIOGRAM N/A 12/09/2013   Procedure: LEFT HEART CATHETERIZATION WITH CORONARY ANGIOGRAM;  Surgeon: [name], MD;  Location: [facility];  Service: Cardiovascular;  Laterality: N/A;   Liver biopsy     POLYPECTOMY  10/17/2017   Procedure: POLYPECTOMY;  Surgeon: [name], MD;  [facility];  Service: Endoscopy;;  ascending colon, splenic flexure,sigmoid x2   POLYPECTOMY  11/08/2020   Procedure: POLYPECTOMY;  Surgeon: [name], MD;  [facility];  Service: Gastroenterology;;   POLYPECTOMY  04/04/2021   Procedure: POLYPECTOMY;  Surgeon: [name], MD;  [facility];  Service: Gastroenterology;;   Joanna Puff  ELBOW RELEASE/NIRSCHEL PROCEDURE Right 07/25/2017   Procedure: TENNIS ELBOW RELEASE and debriedment;  Surgeon: [name], MD;  Location: [facility];  Service: Orthopedics;  Laterality: Right;   TONSILLECTOMY  1970's       Home Medications    Prior to Admission medications   Medication Sig Start Date End Date Taking? Authorizing Provider  acetaminophen (TYLENOL) 500 MG tablet Take 1,500 mg by mouth daily as needed for moderate pain.    [provider]  albuterol (VENTOLIN HFA) 108 (90 Base) MCG/ACT inhaler INHALE 2 PUFFS INTO LUNGS EVERY 6 HOURS AS NEEDED FOR WHEEZING AND SHORTNESS OF BREATH. Patient taking differently: Inhale 2 puffs into the lungs every 6 (six) hours as needed for  shortness of breath or wheezing. INHALE 2 PUFFS INTO LUNGS EVERY 6 HOURS AS NEEDED FOR WHEEZING AND SHORTNESS OF BREATH. 08/10/19   Mikey Kirschner, MD  ALPRAZolam Duanne Moron) 1 MG tablet Take 1 tablet (1 mg total) by mouth 3 (three) times daily as needed for anxiety. 08/13/22 08/13/23  Cloria Spring, MD  amLODipine (NORVASC) 5 MG tablet Take 1 tablet (5 mg total) by mouth daily. 04/11/22 04/06/23  Isaiah Serge, NP  amoxicillin (AMOXIL) 500 MG capsule Take 500 mg by mouth 3 (three) times daily.    [provider]  aspirin 81 MG EC tablet Take 1 tablet (81 mg total) by mouth daily. 01/22/19   [name], MD  atorvastatin (LIPITOR) 40 MG tablet TAKE ONE TABLET (40MG TOTAL) BY MOUTH DAILY 01/22/22   Satira Sark, MD  B-D UF III MINI PEN NEEDLES 31G X 5 MM MISC USE UP TO 4 TIMES DAILY WITH INSULIN AS DIRECTED. 03/23/22   [name], MD  chlorhexidine (PERIDEX) 0.12 % solution Use as directed 15 mLs in the mouth or throat 2 (two) times daily. 08/17/22   Volney American, PA-C  clindamycin (CLEOCIN) 300 MG capsule Take 1 capsule (300 mg total) by mouth 2 (two) times daily. 08/17/22   Volney American, PA-C  colestipol (COLESTID) 1 g tablet Take 2 tablets (2 g total) by mouth daily. Take 4 hours apart from other medication Patient taking differently: Take 1 g by mouth daily as needed (diarrhea). Take 4 hours apart from other medication 09/07/21   Montez Morita, Quillian Quince, MD  Continuous Blood Gluc Sensor (FREESTYLE LIBRE 14 DAY SENSOR) MISC Inject 1 each into the skin every 14 (fourteen) days. Use as directed. 12/24/19   Cassandria Anger, MD  Continuous Blood Gluc Sensor (FREESTYLE LIBRE 14 DAY SENSOR) MISC See admin instructions.    [provider]  dapagliflozin propanediol (FARXIGA) 5 MG TABS tablet Take 5 mg by mouth daily. 03/31/20   [provider]  ezetimibe (ZETIA) 10 MG tablet TAKE ONE TABLET (10MG TOTAL) BY MOUTH DAILY 08/23/22   [name], MD   fluticasone (FLONASE) 50 MCG/ACT nasal spray Place 2 sprays into both nostrils daily. Patient taking differently: Place 2 sprays into both nostrils daily as needed for allergies. 09/13/19   Wurst, [place], PA-C  fluticasone-salmeterol (ADVAIR HFA) 235-36 MCG/ACT inhaler INHALE 2 PUFFS INTO THE LUNGS TWICE DAILY Patient taking differently: Inhale 2 puffs into the lungs 2 (two) times daily as needed (shortness of breath). 09/20/21   [name], MD  gabapentin (NEURONTIN) 300 MG capsule 2 qam and 2qevening 06/26/22   Luking, Elayne Snare, MD  HUMALOG KWIKPEN 200 UNIT/ML KwikPen Inject 30-55 Units into the skin 3 (three) times daily before meals. 10/18/20  [provider]  isosorbide dinitrate (ISORDIL) 30 MG tablet 1 tablet    [provider]  Lancets MISC 1 each by Does not apply route 4 (four) times daily. 09/24/19   Cassandria Anger, MD  loratadine (CLARITIN) 10 MG tablet Take 10 mg by mouth daily as needed for allergies.    [provider]  losartan (COZAAR) 25 MG tablet TAKE ONE (1) TABLET BY MOUTH EVERY DAY 01/22/22   Satira Sark, MD  metFORMIN (GLUCOPHAGE-XR) 500 MG 24 hr tablet Take 1,000 mg by mouth 2 (two) times daily.    [provider]  methocarbamol (ROBAXIN) 500 MG tablet Take 500 mg by mouth every 8 (eight) hours as needed for muscle spasms.    [provider]  nitroGLYCERIN (NITROSTAT) 0.4 MG SL tablet Place 1 tablet (0.4 mg total) under the tongue every 5 (five) minutes as needed. 07/28/21   Satira Sark, MD  omeprazole (PRILOSEC) 40 MG capsule TAKE ONE CAPSULE (40MG TOTAL) BY MOUTH DAILY 05/09/22   Montez Morita, Quillian Quince, MD  ondansetron (ZOFRAN-ODT) 8 MG disintegrating tablet TAKE ONE TABLET (8MG TOTAL) BY MOUTH EVERY 8 HOURS AS NEEDED FRO NAUSEA OR VOMITING Patient taking differently: Take 8 mg by mouth every 8 (eight) hours as needed for nausea or vomiting. 10/30/21   [name], MD  propranolol (INDERAL) 20 MG tablet  Take 1 tablet (20 mg total) by mouth 2 (two) times daily. 02/07/22   Tat, Eustace Quail, DO  ranolazine (RANEXA) 1000 MG SR tablet TAKE ONE TABLET (1000MG TOTAL) BY MOUTH TWO TIMES DAILY 06/20/22   Satira Sark, MD  sertraline (ZOLOFT) 100 MG tablet Take 1.5 tablets (150 mg total) by mouth daily. 08/13/22   Cloria Spring, MD  TOUJEO MAX SOLOSTAR 300 UNIT/ML Solostar Pen INJECT 130 UNITS INTO THE SKIN AT BEDTIME. Patient taking differently: Inject 50-55 Units into the skin at bedtime. 04/11/20   Cassandria Anger, MD  traZODone (DESYREL) 50 MG tablet Take 1 tablet (50 mg total) by mouth at bedtime as needed. 04/28/21   Cloria Spring, MD    Family History Family History  Problem Relation Age of Onset   Osteoporosis Mother    Cancer Maternal Aunt    CVA Maternal Grandmother    CVA Maternal Grandfather    Healthy Son     Social History Social History   Tobacco Use   Smoking status: Former    Packs/day: 0.50    Years: 10.00    Total pack years: 5.00    Types: Cigarettes    Start date: 02/02/1973    Quit date: 09/17/1983    Years since quitting: 38.9   Smokeless tobacco: Former    Types: Chew    Quit date: 09/17/1983  Vaping Use   Vaping Use: Never used  Substance Use Topics   Alcohol use: No    Alcohol/week: 0.0 standard drinks of alcohol   Drug use: No     Allergies   Bydureon [exenatide], Ozempic (0.25 or 0.5 mg-dose) [semaglutide(0.25 or 0.91m-dos)], and Doxycycline   Review of Systems Review of Systems Per HPI  Physical Exam Triage Vital Signs ED Triage Vitals  Enc Vitals Group     BP 08/27/22 1120 138/80     Pulse Rate 08/27/22 1120 (!) 115     Resp 08/27/22 1120 (!) 24     Temp 08/27/22 1120 (!) 100.4 F (38 C)     Temp Source 08/27/22 1120 Oral  SpO2 08/27/22 1120 93 %     Weight --      Height --      Head Circumference --      Peak Flow --      Pain Score 08/27/22 1125 6     Pain Loc --      Pain Edu? --      Excl. in [place]? --    No data  found.  Updated Vital Signs BP 104/75 (BP Location: Right Arm)   Pulse 98   Temp 98.9 F (37.2 C) (Oral)   Resp 20   SpO2 94%   Visual Acuity Right Eye Distance:   Left Eye Distance:   Bilateral Distance:    Right Eye Near:   Left Eye Near:    Bilateral Near:     Physical Exam Vitals and nursing note reviewed.  Constitutional:      General: He is not in acute distress.    Appearance: Normal appearance. He is diaphoretic. He is not ill-appearing or toxic-appearing.  HENT:     Head: Normocephalic and atraumatic.     Right Ear: Ear canal and external ear normal. A middle ear effusion is present.     Left Ear: Ear canal and external ear normal. A middle ear effusion is present.     Nose: Congestion and rhinorrhea present.     Mouth/Throat:     Mouth: Mucous membranes are moist.     Pharynx: Oropharynx is clear. Posterior oropharyngeal erythema present. No oropharyngeal exudate.     Tonsils: No tonsillar exudate. 0 on the right. 0 on the left.  Eyes:     General: No scleral icterus.    Extraocular Movements: Extraocular movements intact.  Cardiovascular:     Rate and Rhythm: Normal rate and regular rhythm.  Pulmonary:     Effort: Pulmonary effort is normal. No respiratory distress.     Breath sounds: Normal breath sounds. No wheezing, rhonchi or rales.  Abdominal:     General: Abdomen is flat. Bowel sounds are normal. There is no distension.     Palpations: Abdomen is soft.     Tenderness: There is no abdominal tenderness. There is no guarding.  Musculoskeletal:     Cervical back: Normal range of motion and neck supple.  Lymphadenopathy:     Cervical: No cervical adenopathy.  Skin:    General: Skin is warm.     Coloration: Skin is not jaundiced or pale.     Findings: No erythema or rash.  Neurological:     Mental Status: He is alert and oriented to person, place, and time.  Psychiatric:        Behavior: Behavior is cooperative.      UC Treatments / Results   Labs (all labs ordered are listed, but only abnormal results are displayed) Labs Reviewed  POCT FASTING CBG [facility] MANUAL ENTRY - Abnormal; Notable for the following components:      Result Value   POCT Glucose ([facility]) 209 (*)    All other components within normal limits  RESP PANEL BY RT-PCR (FLU A&B, COVID) [id number]    EKG   Radiology DG Chest 2 View  Result Date: 08/27/2022 CLINICAL DATA:  Table formatting from the original note was not included. Pt reports his son tested positive for covid but pt tested negative for covid Wednesday. Heavy breathing, thick yellow congestion being coughed up sore throat EXAM: CHEST - 2 VIEW COMPARISON:  05/17/2022 FINDINGS: Lungs are clear. Heart size and  mediastinal contours are within normal limits. No effusion. Visualized bones unremarkable. IMPRESSION: No acute cardiopulmonary disease. Electronically Signed   By: Lucrezia Europe M.D.   On: 08/27/2022 11:57    Procedures Procedures (including critical care time)  Medications Ordered in UC Medications  acetaminophen (TYLENOL) tablet 650 mg (650 mg Oral Given 08/27/22 1131)  sodium chloride 0.9 % bolus 1,000 mL (0 mLs Intravenous Stopped 08/27/22 1330)    Initial Impression / Assessment and Plan / UC Course  I have reviewed the triage vital signs and the nursing notes.  Pertinent labs & imaging results that were available during my care of the patient were reviewed by me and considered in my medical decision making (see chart for details).   Patient is ill-appearing, diaphoretic, however normotensive.  He has a low-grade fever, is mildly tachycardic and tachypneic in triage.  Initially, he is oxygenating 93% on room air.  Shortness of breath Viral URI with cough Encounter for screening for COVID-19 Exposure to COVID-19 virus Initially, concerned about fever, tachypnea, and tachycardia.  Suspect possible dehydration secondary to acute viral illness Chest x-ray today negative for acute cardiopulmonary  process 1 L bolus of normal saline given in urgent care today; patient afebrile after and tachycardia resolved as well as tachypnea.  Patient oxygenating 94% on room air after bolus COVID-19, influenza testing obtained Patient is a good candidate for molnupiravir or Tamiflu if he test positive Supportive care discussed with patient Strict ER precautions discussed   The patient was given the opportunity to ask questions.  All questions answered to their satisfaction.  The patient is in agreement to this plan.  Final Clinical Impressions(s) / UC Diagnoses   Final diagnoses:  Shortness of breath  Viral URI with cough  Encounter for screening for COVID-19  Exposure to COVID-19 virus     Discharge Instructions      You have a viral upper respiratory infection.  Symptoms should improve over the next week to 10 days.  If you develop chest pain or shortness of breath, go to the emergency room.  Chest x-ray today does not show any pneumonia or active/acute cardiopulmonary disease.  We have tested you today for COVID-19 and influenza.  You will see the results in Mychart and we will call you with positive results.    Please stay home and isolate until you are aware of the results.    Some things that can make you feel better are: - Any Coricidin product (safe for heart) - Increased rest - Increasing fluid with water/sugar free electrolytes - Acetaminophen as needed for fever/pain - Salt water gargling, chloraseptic spray and throat lozenges - OTC guaifenesin (Mucinex) 600 mg twice daily for congestion - Saline sinus flushes or a neti pot - Humidifying the air     ED Prescriptions   None    PDMP not reviewed this encounter.   Eulogio Bear, NP 08/27/22 1914

## 2022-08-27 NOTE — ED Notes (Signed)
Patient resting, reports filling much better.

## 2022-08-28 ENCOUNTER — Telehealth (HOSPITAL_COMMUNITY): Payer: Self-pay | Admitting: Emergency Medicine

## 2022-08-28 MED ORDER — MOLNUPIRAVIR EUA 200MG CAPSULE: 4.0000 | ORAL_CAPSULE | Freq: Two times a day (BID) | ORAL | 0 refills | Status: AC

## 2022-09-03 ENCOUNTER — Telehealth: Payer: Self-pay

## 2022-09-03 NOTE — Telephone Encounter (Signed)
Patient scheduled for follow up visit 09/04/22 at 4:30pm- Patient states he is doing better but it will not "let go" he still having a lot of head congestion, no fever or SOB but not feeling well

## 2022-09-03 NOTE — Telephone Encounter (Signed)
Caller name: MAUDE GLOOR  On DPR?: Yes  Call back number: [phone] (mobile)  Provider they see: [name], MD  Reason for call:Shea come in this morning and talked to Dr Nicki Reaper her husband is positive with Covid and Dr Nicki Reaper said he would call him in something to Reform?

## 2022-09-03 NOTE — Telephone Encounter (Signed)
Please advise. Thank you

## 2022-09-03 NOTE — Telephone Encounter (Signed)
There was a misunderstanding What I said to her was that if he was still feeling quite sick and not improving I do want to see him So please call the patient and see how he is doing Find out is he running fever?  Shortness of breath?  Other symptoms currently? He was diagnosed with COVID last week I will need to work him into the schedule perhaps later today or tomorrow but the above information would be helpful

## 2022-09-04 ENCOUNTER — Ambulatory Visit (INDEPENDENT_AMBULATORY_CARE_PROVIDER_SITE_OTHER): Payer: Medicare Other | Admitting: Family Medicine

## 2022-09-04 ENCOUNTER — Encounter: Payer: Self-pay | Admitting: Family Medicine

## 2022-09-04 VITALS — BP 120/77 | HR 93 | Temp 97.5°F | Ht 73.0 in | Wt 265.0 lb

## 2022-09-04 DIAGNOSIS — J019 Acute sinusitis, unspecified: Secondary | ICD-10-CM

## 2022-09-04 MED ORDER — CEFDINIR 300 MG PO CAPS: 300.0000 mg | ORAL_CAPSULE | Freq: Two times a day (BID) | ORAL | 0 refills | Status: DC

## 2022-09-04 NOTE — Progress Notes (Signed)
   Subjective:    Patient ID: Jared Griffin, male    DOB: Jan 15, 1961, 61 y.o.   MRN: [id number]  HPI Patient reports covid symptoms since 12/08- currently has a cough  Patient felt ill and was diagnosed on the eighth with COVID patient relates body aches head congestion drainage coughing has underlying diabetes may hit him pretty hard but now he states is much primarily sinus pressure pain discomfort  He also had a upper molar that was pulled and it caused ongoing communication to his sinus he is following up with a dentist regarding that    Review of Systems     Objective:   Physical Exam Lungs are clear hearts regular mild sinus tenderness left side  The molar area has a lot of rawness in the base     Assessment & Plan:  Recommend follow-up with a dentist more than likely will need to see a oral surgeon to see if it needs a procedure to close it over Sinusitis Go ahead with antibiotics as prescribed Post COVID gradually getting better no sign of pneumonia no need for x-rays or lab work

## 2022-09-06 ENCOUNTER — Other Ambulatory Visit: Payer: Self-pay | Admitting: Cardiology

## 2022-09-18 DIAGNOSIS — E1165 Type 2 diabetes mellitus with hyperglycemia: Secondary | ICD-10-CM | POA: Diagnosis not present

## 2022-09-18 DIAGNOSIS — E781 Pure hyperglyceridemia: Secondary | ICD-10-CM | POA: Diagnosis not present

## 2022-09-19 ENCOUNTER — Encounter: Payer: Self-pay | Admitting: Hematology

## 2022-09-20 ENCOUNTER — Other Ambulatory Visit: Payer: Self-pay | Admitting: Family Medicine

## 2022-09-22 DIAGNOSIS — E1165 Type 2 diabetes mellitus with hyperglycemia: Secondary | ICD-10-CM | POA: Diagnosis not present

## 2022-09-25 DIAGNOSIS — E1165 Type 2 diabetes mellitus with hyperglycemia: Secondary | ICD-10-CM | POA: Diagnosis not present

## 2022-09-25 DIAGNOSIS — I10 Essential (primary) hypertension: Secondary | ICD-10-CM | POA: Diagnosis not present

## 2022-09-25 DIAGNOSIS — E781 Pure hyperglyceridemia: Secondary | ICD-10-CM | POA: Diagnosis not present

## 2022-09-25 DIAGNOSIS — I2510 Atherosclerotic heart disease of native coronary artery without angina pectoris: Secondary | ICD-10-CM | POA: Diagnosis not present

## 2022-09-25 DIAGNOSIS — E1140 Type 2 diabetes mellitus with diabetic neuropathy, unspecified: Secondary | ICD-10-CM | POA: Diagnosis not present

## 2022-09-25 DIAGNOSIS — Z794 Long term (current) use of insulin: Secondary | ICD-10-CM | POA: Diagnosis not present

## 2022-09-26 ENCOUNTER — Encounter: Payer: Self-pay | Admitting: Family Medicine

## 2022-09-26 ENCOUNTER — Ambulatory Visit (INDEPENDENT_AMBULATORY_CARE_PROVIDER_SITE_OTHER): Payer: PPO | Admitting: Family Medicine

## 2022-09-26 ENCOUNTER — Encounter: Payer: Self-pay | Admitting: Hematology

## 2022-09-26 VITALS — BP 103/72 | Wt 271.0 lb

## 2022-09-26 DIAGNOSIS — E1159 Type 2 diabetes mellitus with other circulatory complications: Secondary | ICD-10-CM | POA: Diagnosis not present

## 2022-09-26 DIAGNOSIS — I25119 Atherosclerotic heart disease of native coronary artery with unspecified angina pectoris: Secondary | ICD-10-CM

## 2022-09-26 DIAGNOSIS — J019 Acute sinusitis, unspecified: Secondary | ICD-10-CM

## 2022-09-26 DIAGNOSIS — F321 Major depressive disorder, single episode, moderate: Secondary | ICD-10-CM | POA: Diagnosis not present

## 2022-09-26 MED ORDER — AMOXICILLIN-POT CLAVULANATE 875-125 MG PO TABS: 1.0000 | ORAL_TABLET | Freq: Two times a day (BID) | ORAL | 0 refills | Status: DC

## 2022-09-26 NOTE — Progress Notes (Signed)
   Subjective:    Patient ID: Jared Griffin, male    DOB: [birth year], 62 y.o.   MRN: [id number]  HPI Pt arrives for follow up. Pt has sinus infection; having oral surgery next Tuesday. Did see urgent care in December. Pt seen endo yesterday.  Patient primarily with head congestion drainage coughing sinus pressure he also has drainage from the sinuses where the tooth was pulled and causes problems with creating a hole between the where the upper tooth was pulled he is going to have surgery next week to correct that  He does follow with endocrinology on a regular basis  Review of Systems     Objective:   Physical Exam Mild sinus tenderness eardrums normal lungs clear heart regular       Assessment & Plan:  Sinusitis antibiotics prescribed warning signs discussed  1. Moderate single current episode of major depressive disorder ([place]) Followed by psychiatry on medication stable currently  2. Coronary artery disease involving native coronary artery of native heart with angina pectoris ([place]) Followed by cardiology stable currently  3. DM type 2 causing vascular disease ([place]) Followed by endocrinology stable currently  4. Acute rhinosinusitis Treat with antibiotics today Follow-up for wellness by late spring

## 2022-10-01 ENCOUNTER — Ambulatory Visit (INDEPENDENT_AMBULATORY_CARE_PROVIDER_SITE_OTHER): Payer: Medicare Other | Admitting: Gastroenterology

## 2022-10-15 NOTE — Progress Notes (Unsigned)
Cardiology Office Note  Date: 10/16/2022   ID: Jared Griffin, Jared Griffin [birth year], MRN [id number]  PCP:  [name], MD  Cardiologist:  Rozann Lesches, MD Electrophysiologist:  None   Chief Complaint  Patient presents with   Cardiac follow-up    History of Present Illness: Jared Griffin is a 62 y.o. male last seen in July 2023 by Ms. Ingold NP, I reviewed the note.  He is here for a routine visit.  Reports no increasing angina or dyspnea on exertion with typical activities.  He states that he has been compliant with his medications as noted below.  Follows regularly with Dr. Wolfgang Phoenix, he had lab work in October at which point his LDL was well-controlled at 46.  I did review his cardiac catheterization report from July of last year.  He did not have any obstructive CAD but evidence of microvascular angina with plan to continue medical therapy.  Past Medical History:  Diagnosis Date   Anxiety    COPD (chronic obstructive pulmonary disease) ([place])    Coronary atherosclerosis of native coronary artery    a. 12/09/2013: s/p PCI with 3.0 x 28 Promus DES to mLAD   Depression    Diabetic neuropathy ([place])    Elbow injury 07/25/2017   Surgery for torn tendon   Essential hypertension    Fatty liver disease, nonalcoholic    GERD (gastroesophageal reflux disease)    Iron deficiency anemia 08/15/2021   Lateral epicondylitis of right elbow    Mixed hyperlipidemia    Obesity    Osgood-Schlatter's disease of right knee    Skin cyst 10/03/2017   Removed from back   Type 2 diabetes mellitus (HCC)     Current Outpatient Medications  Medication Sig Dispense Refill   acetaminophen (TYLENOL) 500 MG tablet Take 1,500 mg by mouth daily as needed for moderate pain.     albuterol (VENTOLIN HFA) 108 (90 Base) MCG/ACT inhaler INHALE 2 PUFFS EVERY 6 HOURS 8.5 g 5   ALPRAZolam (XANAX) 1 MG tablet Take 1 tablet (1 mg total) by mouth 3 (three) times daily as needed for anxiety. 90 tablet 3    amLODipine (NORVASC) 5 MG tablet Take 1 tablet (5 mg total) by mouth daily. 90 tablet 3   amoxicillin-clavulanate (AUGMENTIN) 875-125 MG tablet Take 1 tablet by mouth 2 (two) times daily. 20 tablet 0   aspirin 81 MG EC tablet Take 1 tablet (81 mg total) by mouth daily. 30 tablet 12   atorvastatin (LIPITOR) 40 MG tablet TAKE ONE TABLET ('40MG'$  TOTAL) BY MOUTH DAILY 90 tablet 1   B-D UF III MINI PEN NEEDLES 31G X 5 MM MISC USE UP TO 4 TIMES DAILY WITH INSULIN AS DIRECTED. 100 each 3   colestipol (COLESTID) 1 g tablet Take 2 tablets (2 g total) by mouth daily. Take 4 hours apart from other medication (Patient taking differently: Take 1 g by mouth daily as needed (diarrhea). Take 4 hours apart from other medication) 180 tablet 3   Continuous Blood Gluc Sensor (FREESTYLE LIBRE 14 DAY SENSOR) MISC Inject 1 each into the skin every 14 (fourteen) days. Use as directed. 2 each 2   Continuous Blood Gluc Sensor (FREESTYLE LIBRE 14 DAY SENSOR) MISC See admin instructions.     dapagliflozin propanediol (FARXIGA) 5 MG TABS tablet Take 5 mg by mouth daily.     ezetimibe (ZETIA) 10 MG tablet TAKE ONE TABLET ('10MG'$  TOTAL) BY MOUTH DAILY 30 tablet 5   fluticasone (FLONASE) 50  MCG/ACT nasal spray Place 2 sprays into both nostrils daily. (Patient taking differently: Place 2 sprays into both nostrils daily as needed for allergies.) 16 g 0   fluticasone-salmeterol (ADVAIR HFA) 115-21 MCG/ACT inhaler INHALE 2 PUFFS INTO THE LUNGS TWICE DAILY (Patient taking differently: Inhale 2 puffs into the lungs 2 (two) times daily as needed (shortness of breath).) 12 g 1   gabapentin (NEURONTIN) 300 MG capsule 2 qam and 2qevening 120 capsule 6   HUMALOG KWIKPEN 200 UNIT/ML KwikPen Inject 30-55 Units into the skin 3 (three) times daily before meals.     isosorbide dinitrate (ISORDIL) 30 MG tablet 1 tablet     Lancets MISC 1 each by Does not apply route 4 (four) times daily. 150 each 5   loratadine (CLARITIN) 10 MG tablet Take 10 mg by  mouth daily as needed for allergies.     losartan (COZAAR) 25 MG tablet TAKE ONE (1) TABLET BY MOUTH EVERY DAY 90 tablet 3   metFORMIN (GLUCOPHAGE-XR) 500 MG 24 hr tablet Take 1,000 mg by mouth 2 (two) times daily.     nitroGLYCERIN (NITROSTAT) 0.4 MG SL tablet Place 1 tablet (0.4 mg total) under the tongue every 5 (five) minutes as needed. 25 tablet 3   omeprazole (PRILOSEC) 40 MG capsule TAKE ONE CAPSULE ('40MG'$  TOTAL) BY MOUTH DAILY 90 capsule 3   ondansetron (ZOFRAN-ODT) 8 MG disintegrating tablet TAKE ONE TABLET ('8MG'$  TOTAL) BY MOUTH EVERY 8 HOURS AS NEEDED FRO NAUSEA OR VOMITING (Patient taking differently: Take 8 mg by mouth every 8 (eight) hours as needed for nausea or vomiting.) 30 tablet 5   propranolol (INDERAL) 20 MG tablet Take 1 tablet (20 mg total) by mouth 2 (two) times daily. 180 tablet 3   ranolazine (RANEXA) 1000 MG SR tablet TAKE ONE TABLET ('1000MG'$  TOTAL) BY MOUTH TWO TIMES DAILY 180 tablet 1   sertraline (ZOLOFT) 100 MG tablet Take 1.5 tablets (150 mg total) by mouth daily. 135 tablet 3   TOUJEO MAX SOLOSTAR 300 UNIT/ML Solostar Pen INJECT 130 UNITS INTO THE SKIN AT BEDTIME. (Patient taking differently: Inject 50-55 Units into the skin at bedtime.) 12 mL 0   No current facility-administered medications for this visit.   Allergies:  Bydureon [exenatide], Ozempic (0.25 or 0.5 mg-dose) [semaglutide(0.25 or 0.'5mg'$ -dos)], and Doxycycline   ROS: No palpitations or syncope.  Physical Exam: VS:  BP 130/76   Pulse 80   Ht '6\' 2"'$  (1.88 m)   Wt 269 lb 6.4 oz (122.2 kg)   SpO2 95%   BMI 34.59 kg/m , BMI Body mass index is 34.59 kg/m.  Wt Readings from Last 3 Encounters:  10/16/22 269 lb 6.4 oz (122.2 kg)  09/26/22 271 lb (122.9 kg)  09/04/22 265 lb (120.2 kg)    General: Patient appears comfortable at rest. HEENT: Conjunctiva and lids normal. Neck: Supple, no elevated JVP or carotid bruits. Lungs: Clear to auscultation, nonlabored breathing at rest. Cardiac: Regular rate and  rhythm, no S3 or significant systolic murmur. Extremities: No pitting edema.  ECG:  An ECG dated 05/17/2022 was personally reviewed today and demonstrated:  Sinus rhythm with prolonged PR interval.  Recent Labwork: 05/17/2022: Hemoglobin 15.1; Magnesium 1.7; Platelets 149 06/26/2022: ALT 43; AST 30; BUN 15; Creatinine, Ser 1.06; Potassium 4.5; Sodium 139     Component Value Date/Time   CHOL 117 06/26/2022 1007   TRIG 238 (H) 06/26/2022 1007   HDL 33 (L) 06/26/2022 1007   CHOLHDL 3.5 06/26/2022 1007   CHOLHDL 6.4 07/12/2018  0713   VLDL UNABLE TO CALCULATE IF TRIGLYCERIDE OVER 400 mg/dL 07/12/2018 0713   LDLCALC 46 06/26/2022 1007    Other Studies Reviewed Today:  Cardiac catheterization 04/10/2022: CONCLUSIONS: Widely patent epicardial coronaries as documented on prior coronary angiography several years ago. Angiographically nonobstructive 30 to 40% stenosis proximal to the LAD stent and 30 to 40% stenosis distal to the LAD stent. Normal LV function with normal EDP. Abnormal coronary physiologic study with elevated intramyocardial resistance (IMR) at 36 (nl < 25) and reduced reduced coronary flow reserve at 2.4.  These findings confirm microvascular dysfunction.   Assessment and Plan:  1.  Microvascular angina with nonobstructive CAD.  Plan to continue medical therapy and risk factor reduction.  He remains on aspirin, Lipitor, Zetia, Farxiga, Norvasc, Cozaar, Inderal, and Ranexa.  2.  Mixed hyperlipidemia, LDL well-controlled at 46 on Lipitor and Zetia.  3.  Essential hypertension, blood pressure is reasonably well-controlled today on current therapy, no changes were made.  Keep follow-up with Dr. Wolfgang Phoenix.  Medication Adjustments/Labs and Tests Ordered: Current medicines are reviewed at length with the patient today.  Concerns regarding medicines are outlined above.   Tests Ordered: No orders of the defined types were placed in this encounter.   Medication Changes: No orders of  the defined types were placed in this encounter.   Disposition:  Follow up  6 months.  Signed, Satira Sark, MD, [facility] 10/16/2022 11:46 AM    [facility] [place]. [address] [place], [place] [address] Phone: [phone]; Fax: [phone]

## 2022-10-16 ENCOUNTER — Ambulatory Visit: Payer: PPO | Attending: Cardiology | Admitting: Cardiology

## 2022-10-16 ENCOUNTER — Encounter: Payer: Self-pay | Admitting: Cardiology

## 2022-10-16 VITALS — BP 130/76 | HR 80 | Ht 74.0 in | Wt 269.4 lb

## 2022-10-16 DIAGNOSIS — I10 Essential (primary) hypertension: Secondary | ICD-10-CM | POA: Diagnosis not present

## 2022-10-16 DIAGNOSIS — I2089 Other forms of angina pectoris: Secondary | ICD-10-CM | POA: Diagnosis not present

## 2022-10-16 DIAGNOSIS — E782 Mixed hyperlipidemia: Secondary | ICD-10-CM | POA: Diagnosis not present

## 2022-10-16 NOTE — Patient Instructions (Signed)
Medication Instructions:  Your physician recommends that you continue on your current medications as directed. Please refer to the Current Medication list given to you today.   Labwork: None today  Testing/Procedures: None today  Follow-Up: 6 months  Any Other Special Instructions Will Be Listed Below (If Applicable).  If you need a refill on your cardiac medications before your next appointment, please call your pharmacy.  

## 2022-10-23 DIAGNOSIS — E1165 Type 2 diabetes mellitus with hyperglycemia: Secondary | ICD-10-CM | POA: Diagnosis not present

## 2022-10-24 ENCOUNTER — Telehealth: Payer: Self-pay | Admitting: Physical Medicine and Rehabilitation

## 2022-10-24 NOTE — Telephone Encounter (Signed)
Patient was here, he would like an appointment with Dr. Ernestina Patches.

## 2022-10-25 ENCOUNTER — Other Ambulatory Visit: Payer: Self-pay | Admitting: Physical Medicine and Rehabilitation

## 2022-10-25 DIAGNOSIS — M5416 Radiculopathy, lumbar region: Secondary | ICD-10-CM

## 2022-10-25 NOTE — Telephone Encounter (Signed)
Spoke with patient and he stated he is starting to have the same type of pain as before. Last injection was 02/2022. It lasted until a couple weeks ago. He is requesting another injection. Please advise

## 2022-10-27 NOTE — Progress Notes (Unsigned)
Jared Griffin, [name] [address]   CLINIC:  Medical Oncology/Hematology  PCP:  [name], MD [address] [place] [address] [phone]   REASON FOR VISIT:  Follow-up for iron deficiency anemia  CURRENT THERAPY: Intermittent IV iron infusions  INTERVAL HISTORY:   Jared Griffin 62 y.o. male returns for routine follow-up of iron deficiency anemia.  He was last evaluated via telemedicine visit by Tarri Abernethy PA-C on 04/27/2022.  Most recent IV iron was Feraheme x 1 on 05/07/2022.  ***  At today's visit, he reports feeling ***.  No recent hospitalizations, surgeries, or changes in baseline health status.  Jared Griffin reports that he felt *** after his IV iron infusion in August 2023. *** His pica has resolved and his restless leg symptoms have become less intense. *** He has not had any nosebleeds in the past few months. He denies any melena or hematochezia. *** He continues to follow with Dr. [name] for chronic angina.  He has some chronic shortness of breath on exertion with his angina. *** He has occasional lightheadedness.  He had a syncopal episode and ED visit on 05/17/2022 thought to be related to dehydration.  He has ***% energy and ***% appetite. He endorses that he is maintaining a stable weight.   ASSESSMENT & PLAN:  1.  Iron deficiency anemia - Seen for initial consultation by Dr. Chryl Heck on 06/16/2021 - EGD (04/04/2021): Normal esophagus, previous pill esophageal ulcer has healed; multiple gastric polyps, normal duodenum - Colonoscopy (04/04/2021): Inadequate prep, external and internal hemorrhoids, polyps, medium sized lipoma in transverse colon - No gross GI hemorrhage - denies bright red blood per rectum and melena    *** - Previously was having frequent nosebleeds, but these have improved*** - Takes daily iron pill without adverse side effects.  *** Also takes omeprazole daily. - Most recent IV iron with  Feraheme x 1 on 05/07/2022. - Symptoms of fatigue, pica, RLS improved after IV iron*** - Most recent labs (10/29/2022): *** - Suspect iron deficiency from frequent epistaxis, compounded by malabsorption in the setting of chronic disease and PPI use.*** - PLAN: *** IV iron *** - Continue daily oral iron supplement *** - Repeat labs and RTC (phone visit) in 6 months.***  PLAN SUMMARY: >> *** >> *** >> ***   [name] [place] **   You were seen today by Tarri Abernethy PA-C for your ***.    *** ***  *** ***  LABS: Return in ***   OTHER TESTS: ***  MEDICATIONS: ***  FOLLOW-UP APPOINTMENT: ***     REVIEW OF SYSTEMS: ***  Review of Systems - Oncology   PHYSICAL EXAM:  ECOG PERFORMANCE STATUS: {CHL ONC ECOG [id number] *** There were no vitals filed for this visit. There were no vitals filed for this visit. Physical Exam  PAST MEDICAL/SURGICAL HISTORY:  Past Medical History:  Diagnosis Date   Anxiety    COPD (chronic obstructive pulmonary disease) ([place])    Coronary atherosclerosis of native coronary artery    a. 12/09/2013: s/p PCI with 3.0 x 28 Promus DES to mLAD   Depression    Diabetic neuropathy ([place])    Elbow injury 07/25/2017   Surgery for torn tendon   Essential hypertension    Fatty liver disease, nonalcoholic    GERD (gastroesophageal reflux disease)    Iron deficiency anemia 08/15/2021   Lateral epicondylitis of right  elbow    Mixed hyperlipidemia    Obesity    Osgood-Schlatter's disease of right knee    Skin cyst 10/03/2017   Removed from back   Type 2 diabetes mellitus [facility])    Past Surgical History:  Procedure Laterality Date   BIOPSY  01/21/2019   Procedure: BIOPSY;  Surgeon: [name], MD;  [facility];  Service: Endoscopy;;  gastric bx and gastric polyps   BIOPSY  11/08/2020   Procedure: BIOPSY;  Surgeon: [name], MD;  [facility]  [facility];  Service: Gastroenterology;;   CARDIAC CATHETERIZATION  2011   CARDIAC CATHETERIZATION N/A 09/20/2015   Procedure: Left Heart Cath and Coronary Angiography;  Surgeon: [name], MD;  Location: [facility];  Service: Cardiovascular;  Laterality: N/A;   CHOLECYSTECTOMY  2003   COLONOSCOPY  06/19/2012   Procedure: COLONOSCOPY;  Surgeon: [name], MD;  [facility];  Service: Endoscopy;  Laterality: N/A;  930   COLONOSCOPY N/A 10/17/2017   Procedure: COLONOSCOPY;  Surgeon: [name], MD;  [facility];  Service: Endoscopy;  Laterality: N/A;  1225   COLONOSCOPY WITH PROPOFOL N/A 11/08/2020   Procedure: COLONOSCOPY WITH PROPOFOL;  Surgeon: [name], MD;  [facility];  Service: Gastroenterology;  Laterality: N/A;  10:45   COLONOSCOPY WITH PROPOFOL N/A 04/04/2021   Procedure: COLONOSCOPY WITH PROPOFOL;  Surgeon: [name], MD;  [facility];  Service: Gastroenterology;  Laterality: N/A;   CYST EXCISION  07/2019   ESOPHAGOGASTRODUODENOSCOPY (EGD) WITH PROPOFOL N/A 01/21/2019   Procedure: ESOPHAGOGASTRODUODENOSCOPY (EGD) WITH PROPOFOL;  Surgeon: [name], MD;  [facility];  Service: Endoscopy;  Laterality: N/A;  10:15am   ESOPHAGOGASTRODUODENOSCOPY (EGD) WITH PROPOFOL N/A 02/17/2021   Procedure: ESOPHAGOGASTRODUODENOSCOPY (EGD) WITH PROPOFOL;  Surgeon: [name], MD;  [facility];  Service: Gastroenterology;  Laterality: N/A;  10:20   ESOPHAGOGASTRODUODENOSCOPY (EGD) WITH PROPOFOL N/A 04/04/2021   Procedure: ESOPHAGOGASTRODUODENOSCOPY (EGD) WITH PROPOFOL;  Surgeon: [name], MD;  [facility];  Service: Gastroenterology;  Laterality: N/A;  12:30   GIVENS CAPSULE STUDY N/A 09/13/2021   Procedure: GIVENS CAPSULE STUDY;  Surgeon: [name], MD;  [facility];  Service: Gastroenterology;   Laterality: N/A;  7:30   INTRAVASCULAR PRESSURE WIRE/FFR STUDY N/A 04/10/2022   Procedure: INTRAVASCULAR PRESSURE WIRE/FFR STUDY;  Surgeon: Belva Crome, MD;  Location: [facility];  Service: Cardiovascular;  Laterality: N/A;   KNEE ARTHROPLASTY Right 1999   LEFT HEART CATH AND CORONARY ANGIOGRAPHY N/A 02/26/2018   Procedure: LEFT HEART CATH AND CORONARY ANGIOGRAPHY;  Surgeon: [name], MD;  Location: [facility];  Service: Cardiovascular;  Laterality: N/A;   LEFT HEART CATH AND CORONARY ANGIOGRAPHY N/A 04/10/2022   Procedure: LEFT HEART CATH AND CORONARY ANGIOGRAPHY;  Surgeon: Belva Crome, MD;  Location: [place];  Service: Cardiovascular;  Laterality: N/A;   LEFT HEART CATHETERIZATION WITH CORONARY ANGIOGRAM N/A 12/09/2013   Procedure: LEFT HEART CATHETERIZATION WITH CORONARY ANGIOGRAM;  Surgeon: [name], MD;  Location: [facility] C[facility];  Service: Cardiovascular;  Laterality: N/A;   Liver biopsy     POLYPECTOMY  10/17/2017   Procedure: POLYPECTOMY;  Surgeon: [name], MD;  [facility];  Service: Endoscopy;;  ascending colon, splenic flexure,sigmoid x2   POLYPECTOMY  11/08/2020   Procedure: POLYPECTOMY;  Surgeon: [name],  MD;  [facility];  Service: Gastroenterology;;   POLYPECTOMY  04/04/2021   Procedure: POLYPECTOMY;  Surgeon: [name], MD;  [facility];  Service: Gastroenterology;;   TENNIS ELBOW RELEASE/NIRSCHEL PROCEDURE Right 07/25/2017   Procedure: TENNIS ELBOW RELEASE and debriedment;  Surgeon: [name], MD;  Location: [facility];  Service: Orthopedics;  Laterality: Right;   TONSILLECTOMY  1970's    SOCIAL HISTORY:  Social History   Socioeconomic History   Marital status: Married    Spouse name: [name]   Number of children: 1   Years of education: Not on file   Highest education level: Not on file  Occupational History   Not on file  Tobacco Use    Smoking status: Former    Packs/day: 0.50    Years: 10.00    Total pack years: 5.00    Types: Cigarettes    Start date: 02/02/1973    Quit date: 09/17/1983    Years since quitting: 39.1   Smokeless tobacco: Former    Types: Chew    Quit date: 09/17/1983  Vaping Use   Vaping Use: Never used  Substance and Sexual Activity   Alcohol use: No    Alcohol/week: 0.0 standard drinks of alcohol   Drug use: No   Sexual activity: Yes  Other Topics Concern   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    Married with 1 child-son   Right handed    12 th    Very little caffeine   One story 12 stairs up to door and 8 steps to front door   Social Determinants of Health   Financial Resource Strain: Low Risk  (08/08/2021)   Overall Financial Resource Strain (CARDIA)    Difficulty of Paying Living Expenses: Not hard at all  Food Insecurity: No Food Insecurity (08/08/2021)   Hunger Vital Sign    Worried About Running Out of Food in the Last Year: Never true    Ran Out of Food in the Last Year: Never true  Transportation Needs: No Transportation Needs (08/08/2021)   PRAPARE - Hydrologist (Medical): No    Lack of Transportation (Non-Medical): No  Physical Activity: Insufficiently Active (08/08/2021)   Exercise Vital Sign    Days of Exercise per Week: 3 days    Minutes of Exercise per Session: 30 min  Stress: No Stress Concern Present (08/08/2021)   [place]    Feeling of Stress : Not at all  Social Connections: [place] (08/08/2021)   Social Connection and Isolation Panel [NHANES]    Frequency of Communication with Friends and Family: More than three times a week    Frequency of Social Gatherings with Friends and Family: More than three times a week    Attends Religious Services: More than 4 times per  year    Active Member of [employer] or Organizations: Yes    Attends Archivist Meetings: More than 4 times per year    Marital Status: Married  Human resources officer Violence: Not At Risk (08/08/2021)   Humiliation, Afraid, Rape, and Kick questionnaire    Fear of Current or Ex-Partner: No    Emotionally Abused: No    Physically Abused: No    Sexually Abused: No    FAMILY HISTORY:  Family History  Problem Relation Age of Onset  Osteoporosis Mother    Cancer Maternal Aunt    CVA Maternal Grandmother    CVA Maternal Grandfather    Healthy Son     CURRENT MEDICATIONS:  Outpatient Encounter Medications as of 10/29/2022  Medication Sig   acetaminophen (TYLENOL) 500 MG tablet Take 1,500 mg by mouth daily as needed for moderate pain.   albuterol (VENTOLIN HFA) 108 (90 Base) MCG/ACT inhaler INHALE 2 PUFFS EVERY 6 HOURS   ALPRAZolam (XANAX) 1 MG tablet Take 1 tablet (1 mg total) by mouth 3 (three) times daily as needed for anxiety.   amLODipine (NORVASC) 5 MG tablet Take 1 tablet (5 mg total) by mouth daily.   amoxicillin-clavulanate (AUGMENTIN) 875-125 MG tablet Take 1 tablet by mouth 2 (two) times daily.   aspirin 81 MG EC tablet Take 1 tablet (81 mg total) by mouth daily.   atorvastatin (LIPITOR) 40 MG tablet TAKE ONE TABLET (40MG TOTAL) BY MOUTH DAILY   B-D UF III MINI PEN NEEDLES 31G X 5 MM MISC USE UP TO 4 TIMES DAILY WITH INSULIN AS DIRECTED.   colestipol (COLESTID) 1 g tablet Take 2 tablets (2 g total) by mouth daily. Take 4 hours apart from other medication (Patient taking differently: Take 1 g by mouth daily as needed (diarrhea). Take 4 hours apart from other medication)   Continuous Blood Gluc Sensor (FREESTYLE LIBRE 14 DAY SENSOR) MISC Inject 1 each into the skin every 14 (fourteen) days. Use as directed.   Continuous Blood Gluc Sensor (FREESTYLE LIBRE 14 DAY SENSOR) MISC See admin instructions.   dapagliflozin propanediol (FARXIGA) 5 MG TABS tablet Take 5 mg by mouth daily.    ezetimibe (ZETIA) 10 MG tablet TAKE ONE TABLET (10MG TOTAL) BY MOUTH DAILY   fluticasone (FLONASE) 50 MCG/ACT nasal spray Place 2 sprays into both nostrils daily. (Patient taking differently: Place 2 sprays into both nostrils daily as needed for allergies.)   fluticasone-salmeterol (ADVAIR HFA) 115-21 MCG/ACT inhaler INHALE 2 PUFFS INTO THE LUNGS TWICE DAILY (Patient taking differently: Inhale 2 puffs into the lungs 2 (two) times daily as needed (shortness of breath).)   gabapentin (NEURONTIN) 300 MG capsule 2 qam and 2qevening   HUMALOG KWIKPEN 200 UNIT/ML KwikPen Inject 30-55 Units into the skin 3 (three) times daily before meals.   isosorbide dinitrate (ISORDIL) 30 MG tablet 1 tablet   Lancets MISC 1 each by Does not apply route 4 (four) times daily.   loratadine (CLARITIN) 10 MG tablet Take 10 mg by mouth daily as needed for allergies.   losartan (COZAAR) 25 MG tablet TAKE ONE (1) TABLET BY MOUTH EVERY DAY   metFORMIN (GLUCOPHAGE-XR) 500 MG 24 hr tablet Take 1,000 mg by mouth 2 (two) times daily.   nitroGLYCERIN (NITROSTAT) 0.4 MG SL tablet Place 1 tablet (0.4 mg total) under the tongue every 5 (five) minutes as needed.   omeprazole (PRILOSEC) 40 MG capsule TAKE ONE CAPSULE (40MG TOTAL) BY MOUTH DAILY   ondansetron (ZOFRAN-ODT) 8 MG disintegrating tablet TAKE ONE TABLET (8MG TOTAL) BY MOUTH EVERY 8 HOURS AS NEEDED FRO NAUSEA OR VOMITING (Patient taking differently: Take 8 mg by mouth every 8 (eight) hours as needed for nausea or vomiting.)   propranolol (INDERAL) 20 MG tablet Take 1 tablet (20 mg total) by mouth 2 (two) times daily.   ranolazine (RANEXA) 1000 MG SR tablet TAKE ONE TABLET (1000MG TOTAL) BY MOUTH TWO TIMES DAILY   sertraline (ZOLOFT) 100 MG tablet Take 1.5 tablets (150 mg total) by mouth daily.  TOUJEO MAX SOLOSTAR 300 UNIT/ML Solostar Pen INJECT 130 UNITS INTO THE SKIN AT BEDTIME. (Patient taking differently: Inject 50-55 Units into the skin at bedtime.)   No  facility-administered encounter medications on file as of 10/29/2022.    ALLERGIES:  Allergies  Allergen Reactions   Bydureon [Exenatide] Diarrhea    Severe diarrhea and nausea   Ozempic (0.25 Or 0.5 Mg-Dose) [Semaglutide(0.25 Or 0.43m-Dos)] Diarrhea    Severe nausea and diarrhea   Doxycycline     Severe esophagitis    LABORATORY DATA:  I have reviewed the labs as listed.  CBC    Component Value Date/Time   WBC 8.4 05/17/2022 0109   RBC 4.91 05/17/2022 0109   HGB 15.1 05/17/2022 0109   HGB 14.0 04/05/2021 1016   HCT 43.8 05/17/2022 0109   HCT 43.1 06/20/2021 0839   PLT 149 (L) 05/17/2022 0109   PLT 235 04/05/2021 1016   MCV 89.2 05/17/2022 0109   MCV 81 04/05/2021 1016   MCH 30.8 05/17/2022 0109   MCHC 34.5 05/17/2022 0109   RDW 14.5 05/17/2022 0109   RDW 13.6 04/05/2021 1016   LYMPHSABS 3.1 05/17/2022 0109   LYMPHSABS 1.7 04/05/2021 1016   MONOABS 0.8 05/17/2022 0109   EOSABS 0.2 05/17/2022 0109   EOSABS 0.3 04/05/2021 1016   BASOSABS 0.1 05/17/2022 0109   BASOSABS 0.1 04/05/2021 1016      Latest Ref Rng & Units 06/26/2022   10:07 AM 05/17/2022    1:09 AM 04/04/2022   11:08 AM  CMP  Glucose 70 - 99 mg/dL 209  96  167   BUN 8 - 27 mg/dL 15  22  12   $ Creatinine 0.76 - 1.27 mg/dL 1.06  1.18  0.95   Sodium 134 - 144 mmol/L 139  141  141   Potassium 3.5 - 5.2 mmol/L 4.5  3.7  3.8   Ch[place]ide 96 - 106 mmol/L 97  106  105   CO2 20 - 29 mmol/L 23  22  27   $ Calcium 8.6 - 10.2 mg/dL 9.4  9.1  8.9   Total Protein 6.0 - 8.5 g/dL 6.7  6.7    Total Bilirubin 0.0 - 1.2 mg/dL 0.9  1.3    Alkaline Phos 44 - 121 IU/L 46  46    AST 0 - 40 IU/L 30  40    ALT 0 - 44 IU/L 43  55      DIAGNOSTIC IMAGING:  I have independently reviewed the relevant imaging and discussed with the patient.   WRAP UP:  All questions were answered. The patient knows to call the clinic with any problems, questions or concerns.  Medical decision making: ***  Time spent on visit: I spent ***  minutes counseling the patient face to face. The total time spent in the appointment was *** minutes and more than 50% was on counseling.  RHarriett Rush PA-C  ***

## 2022-10-29 ENCOUNTER — Inpatient Hospital Stay: Payer: PPO | Attending: Hematology

## 2022-10-29 ENCOUNTER — Encounter: Payer: Self-pay | Admitting: Hematology

## 2022-10-29 ENCOUNTER — Inpatient Hospital Stay (HOSPITAL_BASED_OUTPATIENT_CLINIC_OR_DEPARTMENT_OTHER): Payer: PPO | Admitting: Physician Assistant

## 2022-10-29 ENCOUNTER — Other Ambulatory Visit: Payer: Self-pay

## 2022-10-29 VITALS — BP 135/84 | HR 72 | Temp 98.5°F | Resp 16 | Wt 270.7 lb

## 2022-10-29 DIAGNOSIS — Z794 Long term (current) use of insulin: Secondary | ICD-10-CM | POA: Diagnosis not present

## 2022-10-29 DIAGNOSIS — Z7982 Long term (current) use of aspirin: Secondary | ICD-10-CM | POA: Diagnosis not present

## 2022-10-29 DIAGNOSIS — Z87891 Personal history of nicotine dependence: Secondary | ICD-10-CM | POA: Diagnosis not present

## 2022-10-29 DIAGNOSIS — E119 Type 2 diabetes mellitus without complications: Secondary | ICD-10-CM | POA: Insufficient documentation

## 2022-10-29 DIAGNOSIS — D509 Iron deficiency anemia, unspecified: Secondary | ICD-10-CM | POA: Insufficient documentation

## 2022-10-29 DIAGNOSIS — Z7984 Long term (current) use of oral hypoglycemic drugs: Secondary | ICD-10-CM | POA: Insufficient documentation

## 2022-10-29 DIAGNOSIS — Z7951 Long term (current) use of inhaled steroids: Secondary | ICD-10-CM | POA: Insufficient documentation

## 2022-10-29 DIAGNOSIS — Z79899 Other long term (current) drug therapy: Secondary | ICD-10-CM | POA: Diagnosis not present

## 2022-10-29 DIAGNOSIS — I10 Essential (primary) hypertension: Secondary | ICD-10-CM | POA: Insufficient documentation

## 2022-10-29 LAB — CBC WITH DIFFERENTIAL/PLATELET
Abs Immature Granulocytes: 0.01 10*3/uL (ref 0.00–0.07)
Basophils Absolute: 0.1 10*3/uL (ref 0.0–0.1)
Basophils Relative: 1 %
Eosinophils Absolute: 0.6 10*3/uL — ABNORMAL HIGH (ref 0.0–0.5)
Eosinophils Relative: 10 %
HCT: 45.5 % (ref 39.0–52.0)
Hemoglobin: 15.5 g/dL (ref 13.0–17.0)
Immature Granulocytes: 0 %
Lymphocytes Relative: 32 %
Lymphs Abs: 2.1 10*3/uL (ref 0.7–4.0)
MCH: 29.9 pg (ref 26.0–34.0)
MCHC: 34.1 g/dL (ref 30.0–36.0)
MCV: 87.7 fL (ref 80.0–100.0)
Monocytes Absolute: 0.4 10*3/uL (ref 0.1–1.0)
Monocytes Relative: 7 %
Neutro Abs: 3.3 10*3/uL (ref 1.7–7.7)
Neutrophils Relative %: 50 %
Platelets: 159 10*3/uL (ref 150–400)
RBC: 5.19 MIL/uL (ref 4.22–5.81)
RDW: 13.8 % (ref 11.5–15.5)
WBC: 6.5 10*3/uL (ref 4.0–10.5)
nRBC: 0 % (ref 0.0–0.2)

## 2022-10-29 LAB — IRON AND TIBC
Iron: 109 ug/dL (ref 45–182)
Saturation Ratios: 24 % (ref 17.9–39.5)
TIBC: 460 ug/dL — ABNORMAL HIGH (ref 250–450)
UIBC: 351 ug/dL

## 2022-10-29 LAB — FERRITIN: Ferritin: 82 ng/mL (ref 24–336)

## 2022-10-29 NOTE — Progress Notes (Signed)
Pharmacy has substituted cetirizine 10 mg orally x 1 as premedication for   Loratidine discontinued.  V.O. Tarri Abernethy, PA-C/Sascha Palma Ronnald Ramp, PharmD

## 2022-10-29 NOTE — Patient Instructions (Signed)
[facility] [place] **   You were seen today by Tarri Abernethy PA-C for your iron deficiency anemia.   Your blood levels look great, but your iron levels are still slightly low. We will schedule you for IV iron x 1 dose. I also recommend that you start taking iron tablet once daily at home.  Take this with lunch or dinner so that you are not taking at the same time as your acid blocking medication.  LABS: Return in 6 months for repeat labs  FOLLOW-UP APPOINTMENT: Phone visit in 6 months, 1 week after labs  ** Thank you for trusting me with your healthcare!  I strive to provide all of my patients with quality care at each visit.  If you receive a survey for this visit, I would be so grateful to you for taking the time to provide feedback.  Thank you in advance!  ~ Sundra Haddix                   Dr. Derek Jack   &   Tarri Abernethy, PA-C   - - - - - - - - - - - - - - - - - -    Thank you for choosing [facility] to provide your oncology and hematology care.  To afford each patient quality time with our provider, please arrive at least 15 minutes before your scheduled appointment time.   If you have a lab appointment with the [facility] please come in thru the Main Entrance and check in at the main information desk.  You need to re-schedule your appointment should you arrive 10 or more minutes late.  We strive to give you quality time with our providers, and arriving late affects you and other patients whose appointments are after yours.  Also, if you no show three or more times for appointments you may be dismissed from the clinic at the providers discretion.     Again, thank you for choosing [facility].  Our hope is that these requests will decrease the amount of time that you wait before being seen by our physicians.        _____________________________________________________________  Should you have questions after your visit to [facility], please contact our office at [phone] and follow the prompts.  Our office hours are 8:00 a.m. and 4:30 p.m. Monday - Friday.  Please note that voicemails left after 4:00 p.m. may not be returned until the following business day.  We are closed weekends and major holidays.  You do have access to a nurse 24-7, just call the main number to the clinic [phone] and do not press any options, hold on the line and a nurse will answer the phone.    For prescription refill requests, have your pharmacy contact our office and allow 72 hours.

## 2022-11-01 ENCOUNTER — Inpatient Hospital Stay: Payer: PPO

## 2022-11-01 VITALS — BP 130/81 | HR 76 | Temp 98.0°F | Resp 18

## 2022-11-01 DIAGNOSIS — D509 Iron deficiency anemia, unspecified: Secondary | ICD-10-CM | POA: Diagnosis not present

## 2022-11-01 MED ORDER — SODIUM CHLORIDE 0.9 % IV SOLN: Freq: Once | INTRAVENOUS | Status: AC

## 2022-11-01 MED ORDER — ACETAMINOPHEN 325 MG PO TABS
650.0000 mg | ORAL_TABLET | Freq: Once | ORAL | Status: AC
  Administered 2022-11-01: 650 mg via ORAL
  Filled 2022-11-01: qty 2

## 2022-11-01 MED ORDER — CETIRIZINE HCL 10 MG PO TABS
10.0000 mg | ORAL_TABLET | Freq: Once | ORAL | Status: AC
  Administered 2022-11-01: 10 mg via ORAL
  Filled 2022-11-01: qty 1

## 2022-11-01 MED ORDER — SODIUM CHLORIDE 0.9 % IV SOLN
510.0000 mg | Freq: Once | INTRAVENOUS | Status: AC
  Administered 2022-11-01: 510 mg via INTRAVENOUS
  Filled 2022-11-01: qty 510

## 2022-11-01 NOTE — Patient Instructions (Signed)
[facility]  Discharge Instructions: Thank you for choosing [facility] to provide your oncology and hematology care.  If you have a lab appointment with the [facility], please come in thru the Main Entrance and check in at the main information desk.  Wear comfortable clothing and clothing appropriate for easy access to any Portacath or PICC line.   We strive to give you quality time with your provider. You may need to reschedule your appointment if you arrive late (15 or more minutes).  Arriving late affects you and other patients whose appointments are after yours.  Also, if you miss three or more appointments without notifying the office, you may be dismissed from the clinic at the provider's discretion.      For prescription refill requests, have your pharmacy contact our office and allow 72 hours for refills to be completed.    Today you received the following Feraheme, return as scheduled.   To help prevent nausea and vomiting after your treatment, we encourage you to take your nausea medication as directed.  BELOW ARE SYMPTOMS THAT SHOULD BE REPORTED IMMEDIATELY: *FEVER GREATER THAN 100.4 F (38 C) OR HIGHER *CHILLS OR SWEATING *NAUSEA AND VOMITING THAT IS NOT CONTROLLED WITH YOUR NAUSEA MEDICATION *UNUSUAL SHORTNESS OF BREATH *UNUSUAL BRUISING OR BLEEDING *URINARY PROBLEMS (pain or burning when urinating, or frequent urination) *BOWEL PROBLEMS (unusual diarrhea, constipation, pain near the anus) TENDERNESS IN MOUTH AND THROAT WITH OR WITHOUT PRESENCE OF ULCERS (sore throat, sores in mouth, or a toothache) UNUSUAL RASH, SWELLING OR PAIN  UNUSUAL VAGINAL DISCHARGE OR ITCHING   Items with * indicate a potential emergency and should be followed up as soon as possible or go to the Emergency Department if any problems should occur.  Please show the CHEMOTHERAPY ALERT CARD or IMMUNOTHERAPY ALERT CARD at check-in to the Emergency Department and  triage nurse.  Should you have questions after your visit or need to cancel or reschedule your appointment, please contact [facility] [phone]  and follow the prompts.  Office hours are 8:00 a.m. to 4:30 p.m. Monday - Friday. Please note that voicemails left after 4:00 p.m. may not be returned until the following business day.  We are closed weekends and major holidays. You have access to a nurse at all times for urgent questions. Please call the main number to the clinic [phone] and follow the prompts.  For any non-urgent questions, you may also contact your provider using MyChart. We now offer e-Visits for anyone 72 and older to request care online for non-urgent symptoms. For details visit [email or web address].   Also download the MyChart app! Go to the app store, search "MyChart", open the app, select [facility], and log in with your MyChart username and password.

## 2022-11-01 NOTE — Progress Notes (Signed)
Patient tolerated iron infusion with no complaints voiced.  Peripheral IV site clean and dry with good blood return noted before and after infusion.  Band aid applied.  VSS with discharge and left in satisfactory condition with no s/s of distress noted.   

## 2022-11-15 ENCOUNTER — Ambulatory Visit (INDEPENDENT_AMBULATORY_CARE_PROVIDER_SITE_OTHER): Payer: PPO | Admitting: Physical Medicine and Rehabilitation

## 2022-11-15 ENCOUNTER — Ambulatory Visit: Payer: Self-pay

## 2022-11-15 ENCOUNTER — Encounter: Payer: Self-pay | Admitting: Radiology

## 2022-11-15 VITALS — BP 123/82 | HR 78

## 2022-11-15 DIAGNOSIS — M5416 Radiculopathy, lumbar region: Secondary | ICD-10-CM | POA: Diagnosis not present

## 2022-11-15 MED ORDER — METHYLPREDNISOLONE ACETATE 80 MG/ML IJ SUSP
80.0000 mg | Freq: Once | INTRAMUSCULAR | Status: AC
  Administered 2022-11-15: 80 mg

## 2022-11-15 NOTE — Patient Instructions (Signed)

## 2022-11-15 NOTE — Progress Notes (Signed)
Functional Pain Scale - descriptive words and definitions  Unmanageable (7)  Pain interferes with normal ADL's/nothing seems to help/sleep is very difficult/active distractions are very difficult to concentrate on. Severe range order  Average Pain 6   +Driver, -BT, -Dye Allergies.  Lower back pain on right side that radiates into right leg

## 2022-11-20 NOTE — Procedures (Signed)
Lumbar Epidural Steroid Injection - Interlaminar Approach with Fluoroscopic Guidance  Patient: Jared Griffin      Date of Birth: 1961-08-29 MRN: [id number] PCP: [name], MD      Visit Date: 11/15/2022   Universal Protocol:     Consent Given By: the patient  Position: PRONE  Additional Comments: Vital signs were monitored before and after the procedure. Patient was prepped and draped in the usual sterile fashion. The correct patient, procedure, and site was verified.   Injection Procedure Details:   Procedure diagnoses: Lumbar radiculopathy [M54.16]   Meds Administered:  Meds ordered this encounter  Medications   methylPREDNISolone acetate (DEPO-MEDROL) injection 80 mg     Laterality: Right  Location/Site:  L3-4  Needle: 3.5 in., 20 ga. Tuohy  Needle Placement: Paramedian epidural  Findings:   -Comments: Excellent flow of contrast into the epidural space.  Procedure Details: Using a paramedian approach from the side mentioned above, the region overlying the inferior lamina was localized under fluoroscopic visualization and the soft tissues overlying this structure were infiltrated with 4 ml. of 1% Lidocaine without Epinephrine. The Tuohy needle was inserted into the epidural space using a paramedian approach.   The epidural space was localized using loss of resistance along with counter oblique bi-planar fluoroscopic views.  After negative aspirate for air, blood, and CSF, a 2 ml. volume of Isovue-250 was injected into the epidural space and the flow of contrast was observed. Radiographs were obtained for documentation purposes.    The injectate was administered into the level noted above.   Additional Comments:  No complications occurred Dressing: 2 x 2 sterile gauze and Band-Aid    Post-procedure details: Patient was observed during the procedure. Post-procedure instructions were reviewed.  Patient left the clinic in stable condition.

## 2022-11-20 NOTE — Progress Notes (Signed)
Jared Griffin - 62 y.o. male MRN [id number]  Date of birth: 1961/05/16  Office Visit Note: Visit Date: 11/15/2022 PCP: [name], MD Referred by: [name], MD  Subjective: Chief Complaint  Patient presents with   Lower Back - Pain   HPI:  Jared Griffin is a 62 y.o. male who comes in today at the request of [name], FNP for planned Right L3-4 Lumbar Interlaminar epidural steroid injection with fluoroscopic guidance.  The patient has failed conservative care including home exercise, medications, time and activity modification.  This injection will be diagnostic and hopefully therapeutic.  Please see requesting physician notes for further details and justification.   ROS Otherwise per HPI.  Assessment & Plan: Visit Diagnoses:    ICD-10-CM   1. Lumbar radiculopathy  M54.16 XR C-ARM NO REPORT    Epidural Steroid injection    methylPREDNISolone acetate (DEPO-MEDROL) injection 80 mg      Plan: No additional findings.   Meds & Orders:  Meds ordered this encounter  Medications   methylPREDNISolone acetate (DEPO-MEDROL) injection 80 mg    Orders Placed This Encounter  Procedures   XR C-ARM NO REPORT   Epidural Steroid injection    Follow-up: Return for visit to requesting provider as needed.   Procedures: No procedures performed  Lumbar Epidural Steroid Injection - Interlaminar Approach with Fluoroscopic Guidance  Patient: Jared Griffin      Date of Birth: [birth year] MRN: [id number] PCP: [name], MD      Visit Date: 11/15/2022   Universal Protocol:     Consent Given By: the patient  Position: PRONE  Additional Comments: Vital signs were monitored before and after the procedure. Patient was prepped and draped in the usual sterile fashion. The correct patient, procedure, and site was verified.   Injection Procedure Details:   Procedure diagnoses: Lumbar radiculopathy [M54.16]   Meds Administered:  Meds ordered this encounter   Medications   methylPREDNISolone acetate (DEPO-MEDROL) injection 80 mg     Laterality: Right  Location/Site:  L3-4  Needle: 3.5 in., 20 ga. Tuohy  Needle Placement: Paramedian epidural  Findings:   -Comments: Excellent flow of contrast into the epidural space.  Procedure Details: Using a paramedian approach from the side mentioned above, the region overlying the inferior lamina was localized under fluoroscopic visualization and the soft tissues overlying this structure were infiltrated with 4 ml. of 1% Lidocaine without Epinephrine. The Tuohy needle was inserted into the epidural space using a paramedian approach.   The epidural space was localized using loss of resistance along with counter oblique bi-planar fluoroscopic views.  After negative aspirate for air, blood, and CSF, a 2 ml. volume of Isovue-250 was injected into the epidural space and the flow of contrast was observed. Radiographs were obtained for documentation purposes.    The injectate was administered into the level noted above.   Additional Comments:  No complications occurred Dressing: 2 x 2 sterile gauze and Band-Aid    Post-procedure details: Patient was observed during the procedure. Post-procedure instructions were reviewed.  Patient left the clinic in stable condition.   Clinical History: MRI LUMBAR SPINE WITHOUT CONTRAST   TECHNIQUE: Multiplanar, multisequence MR imaging of the lumbar spine was performed. No intravenous contrast was administered.   COMPARISON:  Plain films lumbar spine 11/10/2018.   FINDINGS: Segmentation: The patient has transitional lumbosacral anatomy. Numbering scheme on this study and is based on the lowest visible pair of ribs on the comparison plain  films. Based on this numbering scheme, there is lumbarization of the first sacral segment on the right and a fully formed disc at the S1-2 level.   Alignment:  Maintained.   Vertebrae: No fracture, evidence of discitis  or worrisome lesion. Hemangioma in L4 is noted.   Conus medullaris and cauda equina: Conus extends to the L2 level. Conus and cauda equina appear normal.   Paraspinal and other soft tissues: Negative.   Disc levels:   L1-2: Mild facet arthropathy.  Otherwise negative.   L2-3: Mild facet degenerative change.  Otherwise negative.   L2-3: Shallow disc bulge, ligamentum flavum thickening and mild to moderate facet degenerative disease. No stenosis.   L3-4: Minimal disc bulge.  No stenosis.   L5-S1: Minimal disc bulge.  No stenosis.   S1-2: Transitional segment.  Negative.   IMPRESSION: Transitional lumbosacral anatomy. Please see numbering scheme above and correlate with plain films if any intervention is planned.   Mild lumbar degenerative change without central canal or foraminal narrowing. Scattered facet arthropathy is most notable at L3-4.     Electronically Signed   By: Inge Rise M.D.   On: 10/10/2019 10:56     Objective:  VS:  HT:    WT:   BMI:     BP:123/82  HR:78bpm  TEMP: ( )  RESP:  Physical Exam Vitals and nursing note reviewed.  Constitutional:      General: He is not in acute distress.    Appearance: Normal appearance. He is not ill-appearing.  HENT:     Head: Normocephalic and atraumatic.     Right Ear: External ear normal.     Left Ear: External ear normal.     Nose: No congestion.  Eyes:     Extraocular Movements: Extraocular movements intact.  Cardiovascular:     Rate and Rhythm: Normal rate.     Pulses: Normal pulses.  Pulmonary:     Effort: Pulmonary effort is normal. No respiratory distress.  Abdominal:     General: There is no distension.     Palpations: Abdomen is soft.  Musculoskeletal:        General: No tenderness or signs of injury.     Cervical back: Neck supple.     Right lower leg: No edema.     Left lower leg: No edema.     Comments: Patient has good distal strength without clonus.  Skin:    Findings: No erythema  or rash.  Neurological:     General: No focal deficit present.     Mental Status: He is alert and oriented to person, place, and time.     Sensory: No sensory deficit.     Motor: No weakness or abnormal muscle tone.     Coordination: Coordination normal.  Psychiatric:        Mood and Affect: Mood normal.        Behavior: Behavior normal.      Imaging: No results found.

## 2022-11-21 DIAGNOSIS — E1165 Type 2 diabetes mellitus with hyperglycemia: Secondary | ICD-10-CM | POA: Diagnosis not present

## 2022-11-26 DIAGNOSIS — E1165 Type 2 diabetes mellitus with hyperglycemia: Secondary | ICD-10-CM | POA: Diagnosis not present

## 2022-11-26 DIAGNOSIS — I2510 Atherosclerotic heart disease of native coronary artery without angina pectoris: Secondary | ICD-10-CM | POA: Diagnosis not present

## 2022-11-26 DIAGNOSIS — E1140 Type 2 diabetes mellitus with diabetic neuropathy, unspecified: Secondary | ICD-10-CM | POA: Diagnosis not present

## 2022-11-26 DIAGNOSIS — I10 Essential (primary) hypertension: Secondary | ICD-10-CM | POA: Diagnosis not present

## 2022-12-12 ENCOUNTER — Encounter (HOSPITAL_COMMUNITY): Payer: Self-pay | Admitting: Psychiatry

## 2022-12-12 ENCOUNTER — Telehealth (INDEPENDENT_AMBULATORY_CARE_PROVIDER_SITE_OTHER): Payer: PPO | Admitting: Psychiatry

## 2022-12-12 DIAGNOSIS — F411 Generalized anxiety disorder: Secondary | ICD-10-CM | POA: Diagnosis not present

## 2022-12-12 DIAGNOSIS — F321 Major depressive disorder, single episode, moderate: Secondary | ICD-10-CM | POA: Diagnosis not present

## 2022-12-12 MED ORDER — SERTRALINE HCL 100 MG PO TABS: 150.0000 mg | ORAL_TABLET | Freq: Every day | ORAL | 3 refills | Status: DC

## 2022-12-12 MED ORDER — TRAZODONE HCL 50 MG PO TABS: 50.0000 mg | ORAL_TABLET | Freq: Every evening | ORAL | 2 refills | Status: DC | PRN

## 2022-12-12 MED ORDER — ALPRAZOLAM 1 MG PO TABS: 1.0000 mg | ORAL_TABLET | Freq: Three times a day (TID) | ORAL | 3 refills | Status: DC | PRN

## 2022-12-12 NOTE — Progress Notes (Signed)
Virtual Visit via Telephone Note  I connected with Jared Griffin on 12/12/22 at  8:40 AM EDT by telephone and verified that I am speaking with the correct person using two identifiers.  Location: Patient: home Provider: office   I discussed the limitations, risks, security and privacy concerns of performing an evaluation and management service by telephone and the availability of in person appointments. I also discussed with the patient that there may be a patient responsible charge related to this service. The patient expressed understanding and agreed to proceed.      I discussed the assessment and treatment plan with the patient. The patient was provided an opportunity to ask questions and all were answered. The patient agreed with the plan and demonstrated an understanding of the instructions.   The patient was advised to call back or seek an in-person evaluation if the symptoms worsen or if the condition fails to improve as anticipated.  I provided 15 minutes of non-face-to-face time during this encounter.   [name], MD  [facility] MD/PA/NP OP Progress Note  12/12/2022 8:56 AM Jared Griffin  MRN:  [id number]  Chief Complaint:  Chief Complaint  Patient presents with   Depression   Anxiety   Follow-up   HPI: This patient is a 62 year old married white male lives with his wife and son in [place].  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  He had also been working at [employer] but had an arm injury at work and had to quit there as well.  The patient returns for follow-up after 4 months regarding his depression and anxiety.  He states that nothing much is changed in his life.  For once his health problems have been fairly stable.  At times he has problems with his irritable bowel syndrome but no more cardiac issues.  He is still having a problem with gap in his mouth that will not heal after tooth was pulled.   He really cannot afford the surgery to fix it.  He is sleeping fairly well.  He is staying busy doing projects around his house and also helping out at discharge. Visit Diagnosis:    ICD-10-CM   1. Moderate single current episode of major depressive disorder ([place])  F32.1     2. Generalized anxiety disorder  F41.1       Past Psychiatric History: none  Past Medical History:  Past Medical History:  Diagnosis Date   Anxiety    COPD (chronic obstructive pulmonary disease) ([place])    Coronary atherosclerosis of native coronary artery    a. 12/09/2013: s/p PCI with 3.0 x 28 Promus DES to mLAD   Depression    Diabetic neuropathy ([place])    Elbow injury 07/25/2017   Surgery for torn tendon   Essential hypertension    Fatty liver disease, nonalcoholic    GERD (gastroesophageal reflux disease)    Iron deficiency anemia 08/15/2021   Lateral epicondylitis of right elbow    Mixed hyperlipidemia    Obesity    Osgood-Schlatter's disease of right knee    Skin cyst 10/03/2017   Removed from back   Type 2 diabetes mellitus ([place])     Past Surgical History:  Procedure Laterality Date   BIOPSY  01/21/2019   Procedure: BIOPSY;  Surgeon: [name], MD;  [facility];  Service: Endoscopy;;  gastric bx and gastric polyps   BIOPSY  11/08/2020  Procedure: BIOPSY;  Surgeon: [name], MD;  [facility];  Service: Gastroenterology;;   CARDIAC CATHETERIZATION  2011   CARDIAC CATHETERIZATION N/A 09/20/2015   Procedure: Left Heart Cath and Coronary Angiography;  Surgeon: [name], MD;  Location: [place] [facility];  Service: Cardiovascular;  Laterality: N/A;   CHOLECYSTECTOMY  2003   COLONOSCOPY  06/19/2012   Procedure: COLONOSCOPY;  Surgeon: [name], MD;  [facility];  Service: Endoscopy;  Laterality: N/A;  930   COLONOSCOPY N/A 10/17/2017   Procedure: COLONOSCOPY;  Surgeon: [name], MD;  [facility];  Service:  Endoscopy;  Laterality: N/A;  1225   COLONOSCOPY WITH PROPOFOL N/A 11/08/2020   Procedure: COLONOSCOPY WITH PROPOFOL;  Surgeon: [name], MD;  [facility];  Service: Gastroenterology;  Laterality: N/A;  10:45   COLONOSCOPY WITH PROPOFOL N/A 04/04/2021   Procedure: COLONOSCOPY WITH PROPOFOL;  Surgeon: [name], MD;  [facility];  Service: Gastroenterology;  Laterality: N/A;   CYST EXCISION  07/2019   ESOPHAGOGASTRODUODENOSCOPY (EGD) WITH PROPOFOL N/A 01/21/2019   Procedure: ESOPHAGOGASTRODUODENOSCOPY (EGD) WITH PROPOFOL;  Surgeon: [name], MD;  [facility];  Service: Endoscopy;  Laterality: N/A;  10:15am   ESOPHAGOGASTRODUODENOSCOPY (EGD) WITH PROPOFOL N/A 02/17/2021   Procedure: ESOPHAGOGASTRODUODENOSCOPY (EGD) WITH PROPOFOL;  Surgeon: [name], MD;  [facility];  Service: Gastroenterology;  Laterality: N/A;  10:20   ESOPHAGOGASTRODUODENOSCOPY (EGD) WITH PROPOFOL N/A 04/04/2021   Procedure: ESOPHAGOGASTRODUODENOSCOPY (EGD) WITH PROPOFOL;  Surgeon: [name], MD;  [facility];  Service: Gastroenterology;  Laterality: N/A;  12:30   GIVENS CAPSULE STUDY N/A 09/13/2021   Procedure: GIVENS CAPSULE STUDY;  Surgeon: [name], MD;  [facility];  Service: Gastroenterology;  Laterality: N/A;  7:30   INTRAVASCULAR PRESSURE WIRE/FFR STUDY N/A 04/10/2022   Procedure: INTRAVASCULAR PRESSURE WIRE/FFR STUDY;  Surgeon: Belva Crome, MD;  Location: [place] [facility];  Service: Cardiovascular;  Laterality: N/A;   KNEE ARTHROPLASTY Right 1999   LEFT HEART CATH AND CORONARY ANGIOGRAPHY N/A 02/26/2018   Procedure: LEFT HEART CATH AND CORONARY ANGIOGRAPHY;  Surgeon: [name], MD;  Location: [place] [facility];  Service: Cardiovascular;  Laterality: N/A;   LEFT HEART CATH AND CORONARY ANGIOGRAPHY N/A 04/10/2022   Procedure: LEFT HEART CATH AND CORONARY  ANGIOGRAPHY;  Surgeon: Belva Crome, MD;  Location: [place] [facility];  Service: Cardiovascular;  Laterality: N/A;   LEFT HEART CATHETERIZATION WITH CORONARY ANGIOGRAM N/A 12/09/2013   Procedure: LEFT HEART CATHETERIZATION WITH CORONARY ANGIOGRAM;  Surgeon: [name], MD;  Location: [facility];  Service: Cardiovascular;  Laterality: N/A;   Liver biopsy     POLYPECTOMY  10/17/2017   Procedure: POLYPECTOMY;  Surgeon: [name], MD;  [facility];  Service: Endoscopy;;  ascending colon, splenic flexure,sigmoid x2   POLYPECTOMY  11/08/2020   Procedure: POLYPECTOMY;  Surgeon: [name], MD;  [facility];  Service: Gastroenterology;;   POLYPECTOMY  04/04/2021   Procedure: POLYPECTOMY;  Surgeon: [name], MD;  [facility];  Service: Gastroenterology;;   TENNIS ELBOW RELEASE/NIRSCHEL PROCEDURE Right 07/25/2017   Procedure: TENNIS ELBOW RELEASE and debriedment;  Surgeon: [name], MD;  Location: [facility];  Service: Orthopedics;  Laterality: Right;   TONSILLECTOMY  1970's    Family Psychiatric History: See below  Family History:  Family  History  Problem Relation Age of Onset   Osteoporosis Mother    Cancer Maternal Aunt    CVA Maternal Grandmother    CVA Maternal Grandfather    Healthy Son     Social History:  Social History   Socioeconomic History   Marital status: Married    Spouse name: [name]   Number of children: 1   Years of education: Not on file   Highest education level: Not on file  Occupational History   Not on file  Tobacco Use   Smoking status: Former    Packs/day: 0.50    Years: 10.00    Additional pack years: 0.00    Total pack years: 5.00    Types: Cigarettes    Start date: 02/02/1973    Quit date: 09/17/1983    Years since quitting: 39.2   Smokeless tobacco: Former    Types: Chew    Quit date: 09/17/1983  Vaping Use   Vaping Use: Never used  Substance and Sexual  Activity   Alcohol use: No    Alcohol/week: 0.0 standard drinks of alcohol   Drug use: No   Sexual activity: Yes  Other Topics Concern   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    Married with 1 child-son   Right handed    12 th    Very little caffeine   One story 12 stairs up to door and 8 steps to front door   Social Determinants of Health   Financial Resource Strain: Low Risk  (08/08/2021)   Overall Financial Resource Strain (CARDIA)    Difficulty of Paying Living Expenses: Not hard at all  Food Insecurity: No Food Insecurity (08/08/2021)   Hunger Vital Sign    Worried About Running Out of Food in the Last Year: Never true    Ran Out of Food in the Last Year: Never true  Transportation Needs: No Transportation Needs (08/08/2021)   PRAPARE - Hydrologist (Medical): No    Lack of Transportation (Non-Medical): No  Physical Activity: Insufficiently Active (08/08/2021)   Exercise Vital Sign    Days of Exercise per Week: 3 days    Minutes of Exercise per Session: 30 min  Stress: No Stress Concern Present (08/08/2021)   [place]    Feeling of Stress : Not at all  Social Connections: [place] (08/08/2021)   Social Connection and Isolation Panel [NHANES]    Frequency of Communication with Friends and Family: More than three times a week    Frequency of Social Gatherings with Friends and Family: More than three times a week    Attends Religious Services: More than 4 times per year    Active Member of [employer] or Organizations: Yes    Attends Music therapist: More than 4 times per year    Marital Status: Married    Allergies:  Allergies  Allergen Reactions   Bydureon [Exenatide] Diarrhea    Severe diarrhea and nausea   Ozempic (0.25 Or 0.5 Mg-Dose) [Semaglutide(0.25 Or  0.5mg -Dos)] Diarrhea    Severe nausea and diarrhea   Doxycycline     Severe esophagitis    Metabolic Disorder Labs: Lab Results  Component Value Date   HGBA1C 7.5 (H) 02/07/2022   MPG 211.6 07/12/2018   MPG 197.25 07/23/2017   No results found for: "PROLACTIN" Lab  Results  Component Value Date   CHOL 117 06/26/2022   TRIG 238 (H) 06/26/2022   HDL 33 (L) 06/26/2022   CHOLHDL 3.5 06/26/2022   VLDL UNABLE TO CALCULATE IF TRIGLYCERIDE OVER 400 mg/dL 07/12/2018   LDLCALC 46 06/26/2022   LDLCALC 94 02/07/2022   Lab Results  Component Value Date   TSH 0.648 08/20/2019   TSH 0.65 08/20/2019    Therapeutic Level Labs: No results found for: "LITHIUM" No results found for: "VALPROATE" No results found for: "CBMZ"  Current Medications: Current Outpatient Medications  Medication Sig Dispense Refill   acetaminophen (TYLENOL) 500 MG tablet Take 1,500 mg by mouth daily as needed for moderate pain.     albuterol (VENTOLIN HFA) 108 (90 Base) MCG/ACT inhaler INHALE 2 PUFFS EVERY 6 HOURS 8.5 g 5   ALPRAZolam (XANAX) 1 MG tablet Take 1 tablet (1 mg total) by mouth 3 (three) times daily as needed for anxiety. 90 tablet 3   amLODipine (NORVASC) 5 MG tablet Take 1 tablet (5 mg total) by mouth daily. 90 tablet 3   amoxicillin-clavulanate (AUGMENTIN) 875-125 MG tablet Take 1 tablet by mouth 2 (two) times daily. 20 tablet 0   aspirin 81 MG EC tablet Take 1 tablet (81 mg total) by mouth daily. 30 tablet 12   atorvastatin (LIPITOR) 40 MG tablet TAKE ONE TABLET (40MG  TOTAL) BY MOUTH DAILY 90 tablet 1   B-D UF III MINI PEN NEEDLES 31G X 5 MM MISC USE UP TO 4 TIMES DAILY WITH INSULIN AS DIRECTED. 100 each 3   colestipol (COLESTID) 1 g tablet Take 2 tablets (2 g total) by mouth daily. Take 4 hours apart from other medication (Patient taking differently: Take 1 g by mouth daily as needed (diarrhea). Take 4 hours apart from other medication) 180 tablet 3   Continuous Blood Gluc Sensor (FREESTYLE LIBRE 14  DAY SENSOR) MISC Inject 1 each into the skin every 14 (fourteen) days. Use as directed. 2 each 2   dapagliflozin propanediol (FARXIGA) 5 MG TABS tablet Take 5 mg by mouth daily.     ezetimibe (ZETIA) 10 MG tablet TAKE ONE TABLET (10MG  TOTAL) BY MOUTH DAILY 30 tablet 5   fluticasone (FLONASE) 50 MCG/ACT nasal spray Place 2 sprays into both nostrils daily. (Patient taking differently: Place 2 sprays into both nostrils daily as needed for allergies.) 16 g 0   fluticasone-salmeterol (ADVAIR HFA) 115-21 MCG/ACT inhaler INHALE 2 PUFFS INTO THE LUNGS TWICE DAILY (Patient taking differently: Inhale 2 puffs into the lungs 2 (two) times daily as needed (shortness of breath).) 12 g 1   gabapentin (NEURONTIN) 300 MG capsule 2 qam and 2qevening 120 capsule 6   HUMALOG KWIKPEN 200 UNIT/ML KwikPen Inject 30-55 Units into the skin 3 (three) times daily before meals.     isosorbide dinitrate (ISORDIL) 30 MG tablet 1 tablet     Lancets MISC 1 each by Does not apply route 4 (four) times daily. 150 each 5   loratadine (CLARITIN) 10 MG tablet Take 10 mg by mouth daily as needed for allergies.     losartan (COZAAR) 25 MG tablet TAKE ONE (1) TABLET BY MOUTH EVERY DAY 90 tablet 3   metFORMIN (GLUCOPHAGE-XR) 500 MG 24 hr tablet Take 1,000 mg by mouth 2 (two) times daily.     nitroGLYCERIN (NITROSTAT) 0.4 MG SL tablet Place 1 tablet (0.4 mg total) under the tongue every 5 (five) minutes as needed. 25 tablet 3   omeprazole (PRILOSEC) 40 MG capsule TAKE ONE  CAPSULE (40MG  TOTAL) BY MOUTH DAILY 90 capsule 3   ondansetron (ZOFRAN-ODT) 8 MG disintegrating tablet TAKE ONE TABLET (8MG  TOTAL) BY MOUTH EVERY 8 HOURS AS NEEDED FRO NAUSEA OR VOMITING 30 tablet 5   propranolol (INDERAL) 20 MG tablet Take 1 tablet (20 mg total) by mouth 2 (two) times daily. 180 tablet 3   ranolazine (RANEXA) 1000 MG SR tablet TAKE ONE TABLET (1000MG  TOTAL) BY MOUTH TWO TIMES DAILY 180 tablet 1   sertraline (ZOLOFT) 100 MG tablet Take 1.5 tablets (150 mg  total) by mouth daily. 135 tablet 3   TOUJEO MAX SOLOSTAR 300 UNIT/ML Solostar Pen INJECT 130 UNITS INTO THE SKIN AT BEDTIME. (Patient taking differently: Inject 50-55 Units into the skin at bedtime.) 12 mL 0   No current facility-administered medications for this visit.     Musculoskeletal: Strength & Muscle Tone: na Gait & Station: na Patient leans: N/A  Psychiatric Specialty Exam: Review of Systems  HENT:  Positive for dental problem.   Gastrointestinal:  Positive for abdominal pain.  All other systems reviewed and are negative.   There were no vitals taken for this visit.There is no height or weight on file to calculate BMI.  General Appearance: NA  Eye Contact:  NA  Speech:  Clear and Coherent  Volume:  Normal  Mood:  Euthymic  Affect:  NA  Thought Process:  Goal Directed  Orientation:  Full (Time, Place, and Person)  Thought Content: WDL   Suicidal Thoughts:  No  Homicidal Thoughts:  No  Memory:  Immediate;   Good Recent;   Good Remote;   Fair  Judgement:  Good  Insight:  Good  Psychomotor Activity:  Normal  Concentration:  Concentration: Good and Attention Span: Good  Recall:  Good  Fund of Knowledge: Good  Language: Good  Akathisia:  No  Handed:  Right  AIMS (if indicated): not done  Assets:  Communication Skills Desire for Improvement Resilience Social Support Talents/Skills  ADL's:  Intact  Cognition: WNL  Sleep:  Good   Screenings: GAD-7    Flowsheet Row Office Visit from 08/08/2020 in [facility] Office Visit from 08/05/2018 in [facility]  Total GAD-7 Score 3 5      PHQ2-9    [facility] Office Visit from 09/26/2022 in [place] Office Visit from 05/22/2022 in [place] Video Visit from 04/05/2022 in [place] at [place] Video Visit from 12/04/2021 in [place] at [place] Video Visit from  09/05/2021 in [facility] at [facility] Total Score 0 0 1 0 0  PHQ-9 Total Score -- 0 -- -- --      Flowsheet Row ED from 08/27/2022 in [facility] at [facility] ED from 08/17/2022 in [facility] ED from 05/17/2022 in [facility] Emergency Department at [place] No Risk No Risk No Risk        Assessment and Plan: This patient is a 62 year old male with a history of depression anxiety and chronic pain.  His mood and anxiety are stable and under good control.  He will continue Zoloft 150 mg daily for depression and Xanax 1 mg up to 3 times daily as needed for anxiety.  He occasionally uses trazodone 25 to 50 mg at bedtime for sleep.  He will return to see me in 4 months  Collaboration of Care:  Collaboration of Care: Primary Care Provider AEB notes are shared with PCP on the epic system  Patient/Guardian was advised Release of Information must be obtained prior to any record release in order to collaborate their care with an outside provider. Patient/Guardian was advised if they have not already done so to contact the registration department to sign all necessary forms in order for [place] to release information regarding their care.   Consent: Patient/Guardian gives verbal consent for treatment and assignment of benefits for services provided during this visit. Patient/Guardian expressed understanding and agreed to proceed.    [name], MD 12/12/2022, 8:56 AM

## 2022-12-17 ENCOUNTER — Ambulatory Visit (INDEPENDENT_AMBULATORY_CARE_PROVIDER_SITE_OTHER): Payer: PPO | Admitting: Gastroenterology

## 2022-12-17 ENCOUNTER — Encounter (INDEPENDENT_AMBULATORY_CARE_PROVIDER_SITE_OTHER): Payer: Self-pay | Admitting: Gastroenterology

## 2022-12-17 VITALS — BP 113/71 | HR 83 | Temp 97.1°F | Ht 74.0 in | Wt 271.6 lb

## 2022-12-17 DIAGNOSIS — K580 Irritable bowel syndrome with diarrhea: Secondary | ICD-10-CM | POA: Diagnosis not present

## 2022-12-17 DIAGNOSIS — K7581 Nonalcoholic steatohepatitis (NASH): Secondary | ICD-10-CM

## 2022-12-17 DIAGNOSIS — R1013 Epigastric pain: Secondary | ICD-10-CM | POA: Diagnosis not present

## 2022-12-17 DIAGNOSIS — R1310 Dysphagia, unspecified: Secondary | ICD-10-CM

## 2022-12-17 NOTE — Progress Notes (Signed)
[name], M.D. Gastroenterology & Hepatology [facility] [address] [place], [place] [address]  Primary Care Physician: [name], MD [place] [id number]  I will communicate my assessment and recommendations to the referring MD via EMR.  Problems: NASH IBS-D History of E. Coli O157 - resolved Bydureon intolerance Dysphagia  History of Present Illness: Jared Griffin is a 62 y.o. male with past medical history COPD, CAD s/p stent, diabetes c/b neuropathy , HLD, NASH and IBS-D,  who presents for follow up of abdominal pain, distention and diarrhea.   The patient was last seen on 12/14/2021. At that time, the patient was advised to continue colestipol 2 g every day and Imodium for breakthrough episodes of diarrhea.  Patient reports he is feeling relatively well. He is back on metformin now and did not have any worsening symptoms. He is having diarrhea once a month, which lasts a couple of days. He usually takes coletsipol or OTC Imodium , which usually controls his symptoms. Has some mild tenderness in his upper abdomen occasionally. Also has had issues with dysphagia with solid food or pill dysphagia 4 times in the last 3 months. No heartburn or odynophagia.  The patient denies having any nausea, vomiting, fever, chills, hematochezia, melena, hematemesis, abdominal distention,  jaundice, pruritus. Has gained some weight despite changing his diet.  Last EGD: 04/04/2021, normal esophagus with healed pill esophageal ulcer, multiple gastric polyps, normal duodenum. Last Colonoscopies:  11/08/2020 which showed a total of 5 subcentimeter polyps in the colon, 15 mm polyp in the mid transverse colon which was resected in a piecemeal fashion, a 20 mm lipoma hemorrhoids.  Random colonic biopsies were negative for microscopic colitis.  All the polyps were tubular adenomas.   Repeat colonoscopy in 04/04/2021  -  Preparation of the colon was inadequate. - Hemorrhoids found on perianal exam. - The examined portion of the ileum was normal. - One 3 mm polyp at the ileocecal valve, removed with a cold snare. Complete resection. Polyp tissue not retrieved. - Medium-sized lipoma in the transverse colon. - The exam was otherwise normal throughout the examined colon. No presence of inflammation in the colon throughout. - Stool in the entire examined colon. - Non-bleeding external internal hemorrhoids.   Recommended repeat colonoscopy in 3 years - Needs 2 day prep  Past Medical History: Past Medical History:  Diagnosis Date   Anxiety    COPD (chronic obstructive pulmonary disease)    Coronary atherosclerosis of native coronary artery    a. 12/09/2013: s/p PCI with 3.0 x 28 Promus DES to mLAD   Depression    Diabetic neuropathy    Elbow injury 07/25/2017   Surgery for torn tendon   Essential hypertension    Fatty liver disease, nonalcoholic    GERD (gastroesophageal reflux disease)    Iron deficiency anemia 08/15/2021   Lateral epicondylitis of right elbow    Mixed hyperlipidemia    Obesity    Osgood-Schlatter's disease of right knee    Skin cyst 10/03/2017   Removed from back   Type 2 diabetes mellitus     Past Surgical History: Past Surgical History:  Procedure Laterality Date   BIOPSY  01/21/2019   Procedure: BIOPSY;  Surgeon: [name], MD;  [facility];  Service: Endoscopy;;  gastric bx and gastric polyps   BIOPSY  11/08/2020   Procedure: BIOPSY;  Surgeon: [name], MD;  [facility];  Service: Gastroenterology;;   CARDIAC CATHETERIZATION  2011   CARDIAC CATHETERIZATION N/A 09/20/2015   Procedure: Left Heart Cath and Coronary Angiography;  Surgeon: [name], MD;  Location: [place] [facility];  Service: Cardiovascular;  Laterality: N/A;   CHOLECYSTECTOMY  2003   COLONOSCOPY  06/19/2012   Procedure: COLONOSCOPY;  Surgeon: [name], MD;  [facility];  Service: Endoscopy;  Laterality: N/A;  930   COLONOSCOPY N/A 10/17/2017   Procedure: COLONOSCOPY;  Surgeon: [name], MD;  [facility];  Service: Endoscopy;  Laterality: N/A;  1225   COLONOSCOPY WITH PROPOFOL N/A 11/08/2020   Procedure: COLONOSCOPY WITH PROPOFOL;  Surgeon: [name], MD;  [facility];  Service: Gastroenterology;  Laterality: N/A;  10:45   COLONOSCOPY WITH PROPOFOL N/A 04/04/2021   Procedure: COLONOSCOPY WITH PROPOFOL;  Surgeon: [name], MD;  [facility];  Service: Gastroenterology;  Laterality: N/A;   CYST EXCISION  07/2019   ESOPHAGOGASTRODUODENOSCOPY (EGD) WITH PROPOFOL N/A 01/21/2019   Procedure: ESOPHAGOGASTRODUODENOSCOPY (EGD) WITH PROPOFOL;  Surgeon: [name], MD;  [facility];  Service: Endoscopy;  Laterality: N/A;  10:15am   ESOPHAGOGASTRODUODENOSCOPY (EGD) WITH PROPOFOL N/A 02/17/2021   Procedure: ESOPHAGOGASTRODUODENOSCOPY (EGD) WITH PROPOFOL;  Surgeon: [name], MD;  [facility];  Service: Gastroenterology;  Laterality: N/A;  10:20   ESOPHAGOGASTRODUODENOSCOPY (EGD) WITH PROPOFOL N/A 04/04/2021   Procedure: ESOPHAGOGASTRODUODENOSCOPY (EGD) WITH PROPOFOL;  Surgeon: [name], MD;  [facility];  Service: Gastroenterology;  Laterality: N/A;  12:30   GIVENS CAPSULE STUDY N/A 09/13/2021   Procedure: GIVENS CAPSULE STUDY;  Surgeon: [name], MD;  [facility];  Service: Gastroenterology;  Laterality: N/A;  7:30   INTRAVASCULAR PRESSURE WIRE/FFR STUDY N/A 04/10/2022   Procedure: INTRAVASCULAR PRESSURE WIRE/FFR STUDY;  Surgeon: Belva Crome, MD;  Location: [place] [facility];  Service: Cardiovascular;  Laterality: N/A;   KNEE ARTHROPLASTY Right 1999   LEFT HEART CATH AND CORONARY ANGIOGRAPHY N/A 02/26/2018   Procedure: LEFT HEART CATH AND CORONARY ANGIOGRAPHY;   Surgeon: [name], MD;  Location: [place] [facility];  Service: Cardiovascular;  Laterality: N/A;   LEFT HEART CATH AND CORONARY ANGIOGRAPHY N/A 04/10/2022   Procedure: LEFT HEART CATH AND CORONARY ANGIOGRAPHY;  Surgeon: Belva Crome, MD;  Location: [place] [facility];  Service: Cardiovascular;  Laterality: N/A;   LEFT HEART CATHETERIZATION WITH CORONARY ANGIOGRAM N/A 12/09/2013   Procedure: LEFT HEART CATHETERIZATION WITH CORONARY ANGIOGRAM;  Surgeon: [name], MD;  Location: [facility];  Service: Cardiovascular;  Laterality: N/A;   Liver biopsy     POLYPECTOMY  10/17/2017   Procedure: POLYPECTOMY;  Surgeon: [name], MD;  [facility];  Service: Endoscopy;;  ascending colon, splenic flexure,sigmoid x2   POLYPECTOMY  11/08/2020   Procedure: POLYPECTOMY;  Surgeon: [name], MD;  [facility];  Service: Gastroenterology;;   POLYPECTOMY  04/04/2021   Procedure: POLYPECTOMY;  Surgeon: [name], MD;  [facility];  Service: Gastroenterology;;   TENNIS ELBOW RELEASE/NIRSCHEL PROCEDURE Right 07/25/2017   Procedure: TENNIS ELBOW RELEASE and debriedment;  Surgeon: [name], MD;  Location: [facility];  Service: Orthopedics;  Laterality: Right;   TONSILLECTOMY  1970's    Family History: Family History  Problem Relation Age of Onset   Osteoporosis Mother    Cancer Maternal Aunt    CVA  Maternal Grandmother    CVA Maternal Grandfather    Healthy Son     Social History: Social History   Tobacco Use  Smoking Status Former   Packs/day: 0.50   Years: 10.00   Additional pack years: 0.00   Total pack years: 5.00   Types: Cigarettes   Start date: 02/02/1973   Quit date: 09/17/1983   Years since quitting: 39.2  Smokeless Tobacco Former   Types: Chew   Quit date: 09/17/1983   Social History   Substance and Sexual Activity  Alcohol Use No   Alcohol/week: 0.0 standard drinks of alcohol    Social History   Substance and Sexual Activity  Drug Use No    Allergies: Allergies  Allergen Reactions   Bydureon [Exenatide] Diarrhea    Severe diarrhea and nausea   Ozempic (0.25 Or 0.5 Mg-Dose) [Semaglutide(0.25 Or 0.5mg -Dos)] Diarrhea    Severe nausea and diarrhea   Doxycycline     Severe esophagitis    Medications: Current Outpatient Medications  Medication Sig Dispense Refill   acetaminophen (TYLENOL) 500 MG tablet Take 1,500 mg by mouth daily as needed for moderate pain.     albuterol (VENTOLIN HFA) 108 (90 Base) MCG/ACT inhaler INHALE 2 PUFFS EVERY 6 HOURS 8.5 g 5   ALPRAZolam (XANAX) 1 MG tablet Take 1 tablet (1 mg total) by mouth 3 (three) times daily as needed for anxiety. 90 tablet 3   amLODipine (NORVASC) 5 MG tablet Take 1 tablet (5 mg total) by mouth daily. 90 tablet 3   aspirin 81 MG EC tablet Take 1 tablet (81 mg total) by mouth daily. 30 tablet 12   atorvastatin (LIPITOR) 40 MG tablet TAKE ONE TABLET (40MG  TOTAL) BY MOUTH DAILY 90 tablet 1   B-D UF III MINI PEN NEEDLES 31G X 5 MM MISC USE UP TO 4 TIMES DAILY WITH INSULIN AS DIRECTED. 100 each 3   colestipol (COLESTID) 1 g tablet Take 2 tablets (2 g total) by mouth daily. Take 4 hours apart from other medication (Patient taking differently: Take 1 g by mouth daily as needed (diarrhea). Take 4 hours apart from other medication) 180 tablet 3   Continuous Blood Gluc Sensor (FREESTYLE LIBRE 14 DAY SENSOR) MISC Inject 1 each into the skin every 14 (fourteen) days. Use as directed. 2 each 2   dapagliflozin propanediol (FARXIGA) 5 MG TABS tablet Take 10 mg by mouth daily.     ezetimibe (ZETIA) 10 MG tablet TAKE ONE TABLET (10MG  TOTAL) BY MOUTH DAILY 30 tablet 5   fluticasone (FLONASE) 50 MCG/ACT nasal spray Place 2 sprays into both nostrils daily. (Patient taking differently: Place 2 sprays into both nostrils daily as needed for allergies.) 16 g 0   fluticasone-salmeterol (ADVAIR HFA) 115-21 MCG/ACT inhaler INHALE 2  PUFFS INTO THE LUNGS TWICE DAILY (Patient taking differently: Inhale 2 puffs into the lungs 2 (two) times daily as needed (shortness of breath).) 12 g 1   gabapentin (NEURONTIN) 300 MG capsule 2 qam and 2qevening 120 capsule 6   HUMALOG KWIKPEN 200 UNIT/ML KwikPen Inject 30-55 Units into the skin 3 (three) times daily before meals.     isosorbide dinitrate (ISORDIL) 30 MG tablet Take 30 mg by mouth daily at 6 (six) AM.     Lancets MISC 1 each by Does not apply route 4 (four) times daily. 150 each 5   loratadine (CLARITIN) 10 MG tablet Take 10 mg by mouth daily as needed for allergies.     losartan (COZAAR) 25  MG tablet TAKE ONE (1) TABLET BY MOUTH EVERY DAY 90 tablet 3   metFORMIN (GLUCOPHAGE-XR) 500 MG 24 hr tablet Take 1,000 mg by mouth 2 (two) times daily.     nitroGLYCERIN (NITROSTAT) 0.4 MG SL tablet Place 1 tablet (0.4 mg total) under the tongue every 5 (five) minutes as needed. 25 tablet 3   omeprazole (PRILOSEC) 40 MG capsule TAKE ONE CAPSULE (40MG  TOTAL) BY MOUTH DAILY 90 capsule 3   ondansetron (ZOFRAN-ODT) 8 MG disintegrating tablet TAKE ONE TABLET (8MG  TOTAL) BY MOUTH EVERY 8 HOURS AS NEEDED FRO NAUSEA OR VOMITING 30 tablet 5   propranolol (INDERAL) 20 MG tablet Take 1 tablet (20 mg total) by mouth 2 (two) times daily. 180 tablet 3   ranolazine (RANEXA) 1000 MG SR tablet TAKE ONE TABLET (1000MG  TOTAL) BY MOUTH TWO TIMES DAILY 180 tablet 1   sertraline (ZOLOFT) 100 MG tablet Take 1.5 tablets (150 mg total) by mouth daily. 135 tablet 3   TOUJEO MAX SOLOSTAR 300 UNIT/ML Solostar Pen INJECT 130 UNITS INTO THE SKIN AT BEDTIME. (Patient taking differently: Inject 50-55 Units into the skin at bedtime.) 12 mL 0   traZODone (DESYREL) 50 MG tablet Take 1 tablet (50 mg total) by mouth at bedtime as needed. 30 tablet 2   No current facility-administered medications for this visit.    Review of Systems: GENERAL: negative for malaise, night sweats HEENT: No changes in hearing or vision, no nose  bleeds or other nasal problems. NECK: Negative for lumps, goiter, pain and significant neck swelling RESPIRATORY: Negative for cough, wheezing CARDIOVASCULAR: Negative for chest pain, leg swelling, palpitations, orthopnea GI: SEE HPI MUSCULOSKELETAL: Negative for joint pain or swelling, back pain, and muscle pain. SKIN: Negative for lesions, rash PSYCH: Negative for sleep disturbance, mood disorder and recent psychosocial stressors. HEMATOLOGY Negative for prolonged bleeding, bruising easily, and swollen nodes. ENDOCRINE: Negative for cold or heat intolerance, polyuria, polydipsia and goiter. NEURO: negative for tremor, gait imbalance, syncope and seizures. The remainder of the review of systems is noncontributory.   Physical Exam: BP 113/71 (BP Location: Right Arm, Patient Position: Sitting, Cuff Size: Large)   Pulse 83   Temp (!) 97.1 F (36.2 C) (Temporal)   Ht 6\' 2"  (1.88 m)   Wt 271 lb 9.6 oz (123.2 kg)   BMI 34.87 kg/m  GENERAL: The patient is AO x3, in no acute distress. HEENT: Head is normocephalic and atraumatic. EOMI are intact. Mouth is well hydrated and without lesions. NECK: Supple. No masses LUNGS: Clear to auscultation. No presence of rhonchi/wheezing/rales. Adequate chest expansion HEART: RRR, normal s1 and s2. ABDOMEN: tender in epigastric area, no guarding, no peritoneal signs, and nondistended. BS +. No masses. EXTREMITIES: Without any cyanosis, clubbing, rash, lesions or edema. NEUROLOGIC: AOx3, no focal motor deficit. SKIN: no jaundice, no rashes  Imaging/Labs: as above  I personally reviewed and interpreted the available labs, imaging and endoscopic files.  Impression and Plan: Jared Griffin is a 62 y.o. male with past medical history COPD, CAD s/p stent, diabetes c/b neuropathy , HLD, NASH and IBS-D,  who presents for follow up of abdominal pain, distention and diarrhea.  Patient has presented significant improvement of his symptoms after stopping  triggering medicines such as exenatide.  He has tolerated restarting metformin without any worsening diarrhea.  Overall, his clinical symptoms seem to be better controlled with the combination of bile salt binders and antidiarrheals which he should continue for now.  Regarding his history of Karlene Lineman, we will  obtain repeat blood workup (CBC and CMP).  Patient was counseled again about the importance of implanting a Mediterranean diet.  Finally, he has presented some issues with dysphagia and occasional abdominal pain.  He does not want to pursue any endoscopic evaluation at this moment, which I find reasonable given these episodes are seldom happening.  -Continue implementing Mediterranean diet -Check CBC and CMP -Can take colestipol 2 g as needed if presenting diarrhea episodes -If still having diarrhea, can take Imodium as needed -Patient should keep [place] updated if presenting worsening dysphagia or persistent abdominal pain  All questions were answered.      [name], MD Gastroenterology and Hepatology [facility]

## 2022-12-17 NOTE — Patient Instructions (Addendum)
Continue implementing Mediterranean diet Perform blood workup Can take colestipol as needed if presenting diarrhea episodes If still having diarrhea, can take Imodium as needed Please keep [place] updated if presenting more issues swallowing or persistent abdominal pain

## 2022-12-18 ENCOUNTER — Other Ambulatory Visit (INDEPENDENT_AMBULATORY_CARE_PROVIDER_SITE_OTHER): Payer: Self-pay | Admitting: Gastroenterology

## 2022-12-18 DIAGNOSIS — K7581 Nonalcoholic steatohepatitis (NASH): Secondary | ICD-10-CM

## 2022-12-18 LAB — COMPREHENSIVE METABOLIC PANEL
AG Ratio: 1.6 (calc) (ref 1.0–2.5)
ALT: 37 U/L (ref 9–46)
AST: 23 U/L (ref 10–35)
Albumin: 4.7 g/dL (ref 3.6–5.1)
Alkaline phosphatase (APISO): 48 U/L (ref 35–144)
BUN: 21 mg/dL (ref 7–25)
CO2: 24 mmol/L (ref 20–32)
Calcium: 10.5 mg/dL — ABNORMAL HIGH (ref 8.6–10.3)
Chloride: 100 mmol/L (ref 98–110)
Creat: 0.87 mg/dL (ref 0.70–1.35)
Globulin: 3 g/dL (calc) (ref 1.9–3.7)
Glucose, Bld: 204 mg/dL — ABNORMAL HIGH (ref 65–99)
Potassium: 4.4 mmol/L (ref 3.5–5.3)
Sodium: 138 mmol/L (ref 135–146)
Total Bilirubin: 0.7 mg/dL (ref 0.2–1.2)
Total Protein: 7.7 g/dL (ref 6.1–8.1)

## 2022-12-18 LAB — CBC WITH DIFFERENTIAL/PLATELET
Absolute Monocytes: 637 cells/uL (ref 200–950)
Basophils Absolute: 49 cells/uL (ref 0–200)
Basophils Relative: 0.7 %
Eosinophils Absolute: 217 cells/uL (ref 15–500)
Eosinophils Relative: 3.1 %
HCT: 49.5 % (ref 38.5–50.0)
Hemoglobin: 16.5 g/dL (ref 13.2–17.1)
Lymphs Abs: 2170 cells/uL (ref 850–3900)
MCH: 29.6 pg (ref 27.0–33.0)
MCHC: 33.3 g/dL (ref 32.0–36.0)
MCV: 88.7 fL (ref 80.0–100.0)
MPV: 10.5 fL (ref 7.5–12.5)
Monocytes Relative: 9.1 %
Neutro Abs: 3927 cells/uL (ref 1500–7800)
Neutrophils Relative %: 56.1 %
Platelets: 170 10*3/uL (ref 140–400)
RBC: 5.58 10*6/uL (ref 4.20–5.80)
RDW: 13.3 % (ref 11.0–15.0)
Total Lymphocyte: 31 %
WBC: 7 10*3/uL (ref 3.8–10.8)

## 2022-12-20 DIAGNOSIS — L603 Nail dystrophy: Secondary | ICD-10-CM | POA: Diagnosis not present

## 2022-12-20 DIAGNOSIS — E1142 Type 2 diabetes mellitus with diabetic polyneuropathy: Secondary | ICD-10-CM | POA: Diagnosis not present

## 2022-12-22 DIAGNOSIS — E1165 Type 2 diabetes mellitus with hyperglycemia: Secondary | ICD-10-CM | POA: Diagnosis not present

## 2022-12-27 ENCOUNTER — Ambulatory Visit (HOSPITAL_COMMUNITY)
Admission: RE | Admit: 2022-12-27 | Discharge: 2022-12-27 | Disposition: A | Discharge status: Home / Self Care | Payer: PPO | Source: Ambulatory Visit | Attending: Gastroenterology | Admitting: Gastroenterology

## 2022-12-27 DIAGNOSIS — K7689 Other specified diseases of liver: Secondary | ICD-10-CM | POA: Diagnosis not present

## 2022-12-27 DIAGNOSIS — K7581 Nonalcoholic steatohepatitis (NASH): Secondary | ICD-10-CM | POA: Insufficient documentation

## 2022-12-31 ENCOUNTER — Other Ambulatory Visit (INDEPENDENT_AMBULATORY_CARE_PROVIDER_SITE_OTHER): Payer: Self-pay | Admitting: Gastroenterology

## 2022-12-31 ENCOUNTER — Encounter (INDEPENDENT_AMBULATORY_CARE_PROVIDER_SITE_OTHER): Payer: Self-pay

## 2022-12-31 DIAGNOSIS — K769 Liver disease, unspecified: Secondary | ICD-10-CM

## 2023-01-05 ENCOUNTER — Ambulatory Visit (HOSPITAL_COMMUNITY)
Admission: RE | Admit: 2023-01-05 | Discharge: 2023-01-05 | Disposition: A | Discharge status: Home / Self Care | Payer: PPO | Source: Ambulatory Visit | Attending: Gastroenterology | Admitting: Gastroenterology

## 2023-01-05 DIAGNOSIS — K769 Liver disease, unspecified: Secondary | ICD-10-CM | POA: Diagnosis not present

## 2023-01-05 DIAGNOSIS — K760 Fatty (change of) liver, not elsewhere classified: Secondary | ICD-10-CM | POA: Diagnosis not present

## 2023-01-05 MED ORDER — GADOBUTROL 1 MMOL/ML IV SOLN
10.0000 mL | Freq: Once | INTRAVENOUS | Status: AC | PRN
  Administered 2023-01-05: 10 mL via INTRAVENOUS

## 2023-01-07 ENCOUNTER — Other Ambulatory Visit: Payer: Self-pay | Admitting: Family Medicine

## 2023-01-10 ENCOUNTER — Telehealth: Payer: Self-pay

## 2023-01-10 ENCOUNTER — Other Ambulatory Visit: Payer: Self-pay | Admitting: Family Medicine

## 2023-01-10 MED ORDER — FLUTICASONE-SALMETEROL 250-50 MCG/ACT IN AEPB: 1.0000 | INHALATION_SPRAY | Freq: Two times a day (BID) | RESPIRATORY_TRACT | 5 refills | Status: AC

## 2023-01-10 NOTE — Telephone Encounter (Signed)
Spoke with patient and advised per Dr Lorin Picket Change in prescription was sent in Please let patient know that Advair HFA no longer covered regular Advair was Also recommend yearly wellness with [place] thank you patient verbalized understanding.

## 2023-01-10 NOTE — Telephone Encounter (Signed)
Change in prescription was sent in Please let patient know that Advair HFA no longer covered regular Advair was Also recommend yearly wellness with [place] thank you

## 2023-01-10 NOTE — Telephone Encounter (Signed)
[facility] called and stated fluticasone-salmeterol (ADVAIR HFA) 115-21 MCG/ACT inhaler is not on the formulary for his insurance any more. The pharmacist wanted to know if you could prescribe reg Advair and his insurance may cover this.

## 2023-01-11 ENCOUNTER — Ambulatory Visit: Payer: PPO | Admitting: Family Medicine

## 2023-01-17 ENCOUNTER — Ambulatory Visit (INDEPENDENT_AMBULATORY_CARE_PROVIDER_SITE_OTHER): Payer: PPO | Admitting: Family Medicine

## 2023-01-17 ENCOUNTER — Telehealth: Payer: Self-pay | Admitting: Family Medicine

## 2023-01-17 VITALS — BP 118/82 | HR 78 | Ht 74.0 in | Wt 271.4 lb

## 2023-01-17 DIAGNOSIS — R29898 Other symptoms and signs involving the musculoskeletal system: Secondary | ICD-10-CM | POA: Diagnosis not present

## 2023-01-17 DIAGNOSIS — M545 Low back pain, unspecified: Secondary | ICD-10-CM | POA: Diagnosis not present

## 2023-01-17 DIAGNOSIS — I700 Atherosclerosis of aorta: Secondary | ICD-10-CM | POA: Diagnosis not present

## 2023-01-17 NOTE — Telephone Encounter (Signed)
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  He relates over the past 3 months he feels his legs are getting weaker.  He describes that if he squats down he does not have the strength to stand back up he has to use objects to propel himself back up or have others lift him back up.  He states this is a new finding.  He does describe pain in his back and into his legs.  He is able to walk 15 to 20 minutes without having any claudication symptoms. Hard to know if this is deconditioning versus lumbar myelopathy versus degenerative neurologic condition  This is something that you can workup further?  Or would you recommend MRI?  Nerve conduction study with EMG?  He is interested in seeing you regarding this  If you would like for [place] to move forward with testing we can facilitate that.  We can also do a referral to your practice.  Thank you for your upcoming input  [facility]ily[facility] Punt family medicine

## 2023-01-17 NOTE — Progress Notes (Signed)
   Subjective:    Patient ID: Jared Griffin, male    DOB: [birth year], 62 y.o.   MRN: [id number]  HPI Patient arrives today having issues with weakness in his legs.  He relates he has noticed weakness in his legs it is worse when he is going up some steps he also states if he squats down low he does not have the strength in his legs to stand up he has had back pain and pain radiating into both legs over the past 6 to 12 months has had previous injections but he states over the past few months he has noticed weakness in his legs he does have neuropathy.  Denies any injury.  No falls.  Patient also states that when he squats for a period of time he has a hard time getting his breath but not bad when he is standing and he is able to walk on level ground for 10 to 15 minutes at a time without stopping Patient is also getting short of breath while squatting.   Review of Systems     Objective:   Physical Exam General-in no acute distress Eyes-no discharge Lungs-respiratory rate normal, CTA CV-no murmurs,RRR Extremities skin warm dry no edema Neuro grossly normal Behavior normal, alert Negative straight leg raise bilateral Reflexes in the knees and ankles good Strength in the feet and in lower legs appears equal bilateral 4+/5       Assessment & Plan:   Patient is seen Dr. Wonda Horner the past for lumbar radiculopathy He states the symptoms are progressive over the past 3 months He denies having this problem before To some degree he does not do any strengthening exercises I have told him that as he gets older in addition to walking he needs to try to do some body weight exercise such as partial squats or sitting into a chair then standing multiple times to help strengthen his legs I am concerned about the possibility of a spinal stenosis causing some weakness in the legs such as myelopathy but also it is possible this could be a degenerative neurologic condition It is also possible it  could be deconditioning We will reach out to Dr. Alvester Morin  Aortic atherosclerosis already on statin

## 2023-01-20 NOTE — Telephone Encounter (Signed)
Nurses-please let the patient know that I did communicate with Dr. Alvester Morin running his leg weakness.  This will need further testing to figure out if this is due to nerve impingement within the spinal cord versus neuromuscular versus deconditioning Dr.Newton recommends that we move forward with a consultation to [facility] neurology who the patient has seen before-Dr Tat They can decide whether or not to do nerve conduction study/EMG or perhaps MRI.  Based on this recommendation-we recommend consultation with [facility] neurolog[facility]/Dr.Tat please go ahead with this consultation/referral thanks-Dr. Lorin Picket

## 2023-01-21 ENCOUNTER — Encounter: Payer: Self-pay | Admitting: *Deleted

## 2023-01-21 DIAGNOSIS — E1165 Type 2 diabetes mellitus with hyperglycemia: Secondary | ICD-10-CM | POA: Diagnosis not present

## 2023-01-21 NOTE — Telephone Encounter (Signed)
Referral ordered in EPIC. Patient notified of provider's recommendations via my chart

## 2023-01-31 DIAGNOSIS — M79671 Pain in right foot: Secondary | ICD-10-CM | POA: Diagnosis not present

## 2023-01-31 DIAGNOSIS — E1142 Type 2 diabetes mellitus with diabetic polyneuropathy: Secondary | ICD-10-CM | POA: Diagnosis not present

## 2023-01-31 DIAGNOSIS — M79672 Pain in left foot: Secondary | ICD-10-CM | POA: Diagnosis not present

## 2023-01-31 LAB — HM DIABETES FOOT EXAM

## 2023-01-31 NOTE — Progress Notes (Signed)
[facility] Quality Team Note  Name: KEVAL WOLSKE Date of Birth: [birth year] MRN: [id number] Date: 01/31/2023  [facility] Quality Team has reviewed this patient's chart, please see recommendations below:  Diabetic Retinal Eye Exam; Patient requests [facility] Quality Coordinator to schedule Diabetic Retinal Screening at [facility] Eye event 02/07/2023, 10:00am).

## 2023-02-01 ENCOUNTER — Telehealth: Payer: Self-pay | Admitting: Physical Medicine and Rehabilitation

## 2023-02-01 DIAGNOSIS — M5416 Radiculopathy, lumbar region: Secondary | ICD-10-CM

## 2023-02-01 NOTE — Telephone Encounter (Signed)
Spoke with patient this afternoon, he reports generalized intermittent weakness to both legs, also reports severe pain to upper back radiating down to lower back pain. He reports multiple falls recently and constant pain. No bowel/bladder incontinence reported. I have placed order for STAT lumbar MRI imaging, we will have him follow up in the office to discuss results. His primary care provider Dr. Lilyan Punt has placed referral to [name] with Neurology. I advised patient to keep this appointment as issues may not directly related to spine.

## 2023-02-03 ENCOUNTER — Ambulatory Visit
Admission: RE | Admit: 2023-02-03 | Discharge: 2023-02-03 | Disposition: A | Discharge status: Home / Self Care | Payer: PPO | Source: Ambulatory Visit | Attending: Physical Medicine and Rehabilitation | Admitting: Physical Medicine and Rehabilitation

## 2023-02-03 DIAGNOSIS — M5416 Radiculopathy, lumbar region: Secondary | ICD-10-CM

## 2023-02-03 DIAGNOSIS — M4807 Spinal stenosis, lumbosacral region: Secondary | ICD-10-CM | POA: Diagnosis not present

## 2023-02-04 ENCOUNTER — Telehealth: Payer: Self-pay | Admitting: Physical Medicine and Rehabilitation

## 2023-02-04 NOTE — Telephone Encounter (Signed)
Spoke with patient this morning, recent lumbar MRI imaging does not correlate with recent symptoms of weakness to bilateral legs and frequent falls. We do not feel this is directly related to lumbar spine. Patient requesting pain medication, however would not recommend opioids with frequent falls. He can follow up with PCP regarding this. I did offer anti-inflammatory medication, however he declined. He has upcoming appointment with [name] with Neurology.

## 2023-02-07 ENCOUNTER — Other Ambulatory Visit: Payer: Self-pay | Admitting: Neurology

## 2023-02-07 DIAGNOSIS — R251 Tremor, unspecified: Secondary | ICD-10-CM

## 2023-02-07 DIAGNOSIS — M542 Cervicalgia: Secondary | ICD-10-CM

## 2023-02-07 LAB — HM DIABETES EYE EXAM

## 2023-02-12 ENCOUNTER — Encounter: Payer: Self-pay | Admitting: Family Medicine

## 2023-02-12 ENCOUNTER — Encounter: Payer: Self-pay | Admitting: *Deleted

## 2023-02-13 ENCOUNTER — Other Ambulatory Visit: Payer: Self-pay | Admitting: Family Medicine

## 2023-02-20 ENCOUNTER — Encounter: Payer: Self-pay | Admitting: Physical Medicine and Rehabilitation

## 2023-02-20 ENCOUNTER — Other Ambulatory Visit: Payer: Self-pay | Admitting: Physical Medicine and Rehabilitation

## 2023-02-20 DIAGNOSIS — G8929 Other chronic pain: Secondary | ICD-10-CM

## 2023-02-20 NOTE — Progress Notes (Signed)
Spoke with patient this afternoon via telephone, his lower back feeling much better post right L3-L4 interlaminar epidural steroid injection on 11/15/2022. He reports continued thoracic back pain, no relief of pain with PT or medications. Next step is to obtain thoracic MRI imaging.

## 2023-02-25 ENCOUNTER — Other Ambulatory Visit: Payer: Self-pay | Admitting: Physical Medicine and Rehabilitation

## 2023-02-25 MED ORDER — PREDNISONE 50 MG PO TABS
50.0000 mg | ORAL_TABLET | Freq: Every day | ORAL | 0 refills | Status: DC
Start: 2023-02-25 — End: 2023-03-13

## 2023-02-26 DIAGNOSIS — Z794 Long term (current) use of insulin: Secondary | ICD-10-CM | POA: Diagnosis not present

## 2023-02-26 DIAGNOSIS — I10 Essential (primary) hypertension: Secondary | ICD-10-CM | POA: Diagnosis not present

## 2023-02-26 DIAGNOSIS — I2510 Atherosclerotic heart disease of native coronary artery without angina pectoris: Secondary | ICD-10-CM | POA: Diagnosis not present

## 2023-02-26 DIAGNOSIS — E1165 Type 2 diabetes mellitus with hyperglycemia: Secondary | ICD-10-CM | POA: Diagnosis not present

## 2023-02-26 LAB — MICROALBUMIN, URINE
Microalb, Ur: 8
Microalb, Ur: 8

## 2023-03-03 ENCOUNTER — Ambulatory Visit
Admission: RE | Admit: 2023-03-03 | Discharge: 2023-03-03 | Disposition: A | Discharge status: Home / Self Care | Payer: PPO | Source: Ambulatory Visit | Attending: Physical Medicine and Rehabilitation | Admitting: Physical Medicine and Rehabilitation

## 2023-03-03 DIAGNOSIS — M546 Pain in thoracic spine: Secondary | ICD-10-CM | POA: Diagnosis not present

## 2023-03-03 DIAGNOSIS — M5124 Other intervertebral disc displacement, thoracic region: Secondary | ICD-10-CM | POA: Diagnosis not present

## 2023-03-03 DIAGNOSIS — G8929 Other chronic pain: Secondary | ICD-10-CM

## 2023-03-08 ENCOUNTER — Other Ambulatory Visit: Payer: Self-pay | Admitting: Cardiology

## 2023-03-11 ENCOUNTER — Telehealth: Payer: Self-pay | Admitting: Physical Medicine and Rehabilitation

## 2023-03-11 NOTE — Telephone Encounter (Signed)
Please schedule follow up for post thoracic MRI plan with [facility]. MRI shows very small disc protrusion, no nerve compression.

## 2023-03-11 NOTE — Progress Notes (Unsigned)
Assessment/Plan:    1.  Tremor  -DaTscan previously done and was negative.  -No functional qualities today.  -He will continue on propranolol, 20 mg twice per day.  He does not need to continue to follow-up with me for this.  2.  Gait instability with chronic back pain  -Patient is following with orthopedics and being worked up appropriately.  -I see no evidence of a neurodegenerative process.  -Certainly, his 1 mg Xanax 3 times daily could contribute to gait instability.  Subjective:   Jared Griffin was seen today for new referral for "weakness in both lower extremities."  Referral is from patient's primary care physician.  Patient is currently under the care of orthopedics for chronic back pain.  He was sent here to make sure he did not have a degenerative neurologic condition on top of his chronic pain.  MRI lumbar spine was done by orthopedics May 19 demonstrating left disc protrusion at L4-L5 and new far left lateral disc protrusion at L2-L3 without associated focal stenosis.  There was an annular tear at L1-L2.  MRI thoracic spine was done June 16 and did not demonstrate any significant central canal stenosis.  Meds that may affect tremor:  xanax 1 mg tid; gabapentin; albuterol; atrovent   Previous meds: (Cannot take primidone due to interaction with Ranexa)  ALLERGIES:   Allergies  Allergen Reactions   Bydureon [Exenatide] Diarrhea    Severe diarrhea and nausea   Ozempic (0.25 Or 0.5 Mg-Dose) [Semaglutide(0.25 Or 0.5mg -Dos)] Diarrhea    Severe nausea and diarrhea   Doxycycline     Severe esophagitis    CURRENT MEDICATIONS:  Outpatient Encounter Medications as of 03/13/2023  Medication Sig   acetaminophen (TYLENOL) 500 MG tablet Take 1,500 mg by mouth daily as needed for moderate pain.   albuterol (VENTOLIN HFA) 108 (90 Base) MCG/ACT inhaler INHALE 2 PUFFS EVERY 6 HOURS   ALPRAZolam (XANAX) 1 MG tablet Take 1 tablet (1 mg total) by mouth 3 (three) times daily as  needed for anxiety.   amLODipine (NORVASC) 5 MG tablet Take 1 tablet (5 mg total) by mouth daily.   aspirin 81 MG EC tablet Take 1 tablet (81 mg total) by mouth daily.   atorvastatin (LIPITOR) 40 MG tablet TAKE ONE TABLET (40MG  TOTAL) BY MOUTH DAILY   B-D UF III MINI PEN NEEDLES 31G X 5 MM MISC USE UP TO 4 TIMES DAILY WITH INSULIN AS DIRECTED.   colestipol (COLESTID) 1 g tablet Take 2 tablets (2 g total) by mouth daily. Take 4 hours apart from other medication (Patient taking differently: Take 1 g by mouth daily as needed (diarrhea). Take 4 hours apart from other medication)   Continuous Blood Gluc Sensor (FREESTYLE LIBRE 14 DAY SENSOR) MISC Inject 1 each into the skin every 14 (fourteen) days. Use as directed.   dapagliflozin propanediol (FARXIGA) 5 MG TABS tablet Take 10 mg by mouth daily.   ezetimibe (ZETIA) 10 MG tablet TAKE ONE TABLET (10MG  TOTAL) BY MOUTH DAILY   fluticasone (FLONASE) 50 MCG/ACT nasal spray Place 2 sprays into both nostrils daily. (Patient taking differently: Place 2 sprays into both nostrils daily as needed for allergies.)   fluticasone-salmeterol (ADVAIR) 250-50 MCG/ACT AEPB Inhale 1 puff into the lungs in the morning and at bedtime.   gabapentin (NEURONTIN) 300 MG capsule TAKE 2 CAPSULES BY MOUTH EACH MORNING AND 2 CAPSULE EACH EVENING.   HUMALOG KWIKPEN 200 UNIT/ML KwikPen Inject 30-55 Units into the skin 3 (three) times daily  before meals.   isosorbide dinitrate (ISORDIL) 30 MG tablet Take 30 mg by mouth daily at 6 (six) AM.   Lancets MISC 1 each by Does not apply route 4 (four) times daily.   loratadine (CLARITIN) 10 MG tablet Take 10 mg by mouth daily as needed for allergies.   losartan (COZAAR) 25 MG tablet TAKE ONE (1) TABLET BY MOUTH EVERY DAY   metFORMIN (GLUCOPHAGE-XR) 500 MG 24 hr tablet Take 1,000 mg by mouth 2 (two) times daily.   nitroGLYCERIN (NITROSTAT) 0.4 MG SL tablet Place 1 tablet (0.4 mg total) under the tongue every 5 (five) minutes as needed.    omeprazole (PRILOSEC) 40 MG capsule TAKE ONE CAPSULE (40MG  TOTAL) BY MOUTH DAILY   ondansetron (ZOFRAN-ODT) 8 MG disintegrating tablet TAKE ONE TABLET (8MG  TOTAL) BY MOUTH EVERY 8 HOURS AS NEEDED FRO NAUSEA OR VOMITING   predniSONE (DELTASONE) 50 MG tablet Take 1 tablet (50 mg total) by mouth daily with breakfast. Take until completed.   propranolol (INDERAL) 20 MG tablet TAKE ONE TABLET (20MG  TOTAL) BY MOUTH TWO TIMES DAILY   ranolazine (RANEXA) 1000 MG SR tablet TAKE ONE TABLET (1000MG  TOTAL) BY MOUTH TWO TIMES DAILY   sertraline (ZOLOFT) 100 MG tablet Take 1.5 tablets (150 mg total) by mouth daily.   TOUJEO MAX SOLOSTAR 300 UNIT/ML Solostar Pen INJECT 130 UNITS INTO THE SKIN AT BEDTIME. (Patient taking differently: Inject 50-55 Units into the skin at bedtime.)   traZODone (DESYREL) 50 MG tablet Take 1 tablet (50 mg total) by mouth at bedtime as needed.   No facility-administered encounter medications on file as of 03/13/2023.     Objective:    PHYSICAL EXAMINATION:    VITALS:   There were no vitals filed for this visit.    GEN:  The patient appears stated age and is in NAD. HEENT:  Normocephalic, atraumatic.  The mucous membranes are moist. The superficial temporal arteries are without ropiness or tenderness. CV:  tachy.  regular Lungs:  CTAB Neck/HEME:  There are no carotid bruits bilaterally.  Neurological examination:  Orientation: The patient is alert and oriented x3. Cranial nerves: There is good facial symmetry. The speech is fluent and clear. Soft palate rises symmetrically and there is no tongue deviation. Hearing is intact to conversational tone. Sensation: Sensation is intact to light touch throughout Motor: Strength is at least antigravity x4.  Movement examination: Tone: Tone is normal. Abnormal movements: Minimal postural tremor.  Minimal intention tremor.  Some tremor with Archimedes spirals on the right. Coordination:  There is no decremation today with rapid  alternating movements.  I have reviewed and interpreted the following labs independently   Chemistry      Component Value Date/Time   NA 138 12/17/2022 1028   NA 139 06/26/2022 1007   K 4.4 12/17/2022 1028   CL 100 12/17/2022 1028   CO2 24 12/17/2022 1028   BUN 21 12/17/2022 1028   BUN 15 06/26/2022 1007   CREATININE 0.87 12/17/2022 1028      Component Value Date/Time   CALCIUM 10.5 (H) 12/17/2022 1028   ALKPHOS 46 06/26/2022 1007   AST 23 12/17/2022 1028   ALT 37 12/17/2022 1028   BILITOT 0.7 12/17/2022 1028   BILITOT 0.9 06/26/2022 1007      Lab Results  Component Value Date   WBC 7.0 12/17/2022   HGB 16.5 12/17/2022   HCT 49.5 12/17/2022   MCV 88.7 12/17/2022   PLT 170 12/17/2022   Lab Results  Component  Value Date   TSH 0.648 08/20/2019     Chemistry      Component Value Date/Time   NA 138 12/17/2022 1028   NA 139 06/26/2022 1007   K 4.4 12/17/2022 1028   CL 100 12/17/2022 1028   CO2 24 12/17/2022 1028   BUN 21 12/17/2022 1028   BUN 15 06/26/2022 1007   CREATININE 0.87 12/17/2022 1028      Component Value Date/Time   CALCIUM 10.5 (H) 12/17/2022 1028   ALKPHOS 46 06/26/2022 1007   AST 23 12/17/2022 1028   ALT 37 12/17/2022 1028   BILITOT 0.7 12/17/2022 1028   BILITOT 0.9 06/26/2022 1007         Total time spent on today's visit was *** minutes, including both face-to-face time and nonface-to-face time.  Time included that spent on review of records (prior notes available to me/labs/imaging if pertinent), discussing treatment and goals, answering patient's questions and coordinating care.  Cc:  Babs Sciara, MD

## 2023-03-13 ENCOUNTER — Ambulatory Visit (INDEPENDENT_AMBULATORY_CARE_PROVIDER_SITE_OTHER): Payer: PPO | Admitting: Neurology

## 2023-03-13 ENCOUNTER — Encounter: Payer: Self-pay | Admitting: Neurology

## 2023-03-13 VITALS — BP 136/76 | HR 72 | Ht 74.0 in | Wt 272.4 lb

## 2023-03-13 DIAGNOSIS — M545 Low back pain, unspecified: Secondary | ICD-10-CM

## 2023-03-13 DIAGNOSIS — G8929 Other chronic pain: Secondary | ICD-10-CM | POA: Diagnosis not present

## 2023-03-13 DIAGNOSIS — M542 Cervicalgia: Secondary | ICD-10-CM

## 2023-03-13 NOTE — Telephone Encounter (Signed)
LVM to return call to schedule OV 

## 2023-03-13 NOTE — Patient Instructions (Signed)
A referral to [facility] has been placed for your MRI someone will contact you directly to schedule your appt. They are located at [address] [facility]. Please contact them directly by calling [phone] with any questions regarding your referral.  Make a follow up with Orthopedics

## 2023-03-14 DIAGNOSIS — M79671 Pain in right foot: Secondary | ICD-10-CM | POA: Diagnosis not present

## 2023-03-14 DIAGNOSIS — M79672 Pain in left foot: Secondary | ICD-10-CM | POA: Diagnosis not present

## 2023-03-14 DIAGNOSIS — E1142 Type 2 diabetes mellitus with diabetic polyneuropathy: Secondary | ICD-10-CM | POA: Diagnosis not present

## 2023-03-15 ENCOUNTER — Other Ambulatory Visit: Payer: Self-pay | Admitting: Neurology

## 2023-03-15 DIAGNOSIS — M542 Cervicalgia: Secondary | ICD-10-CM

## 2023-03-15 DIAGNOSIS — R251 Tremor, unspecified: Secondary | ICD-10-CM

## 2023-03-15 NOTE — Telephone Encounter (Signed)
Called apteitn and explained that we would fill 90 days and 2 refills and after that patient would need to get PCP to fill

## 2023-03-17 ENCOUNTER — Other Ambulatory Visit: Payer: PPO

## 2023-03-18 ENCOUNTER — Ambulatory Visit
Admission: RE | Admit: 2023-03-18 | Discharge: 2023-03-18 | Disposition: A | Discharge status: Home / Self Care | Payer: PPO | Source: Ambulatory Visit | Attending: Neurology | Admitting: Neurology

## 2023-03-18 ENCOUNTER — Other Ambulatory Visit: Payer: Self-pay | Admitting: Family Medicine

## 2023-03-18 ENCOUNTER — Telehealth: Payer: Self-pay | Admitting: Physical Medicine and Rehabilitation

## 2023-03-18 DIAGNOSIS — G8929 Other chronic pain: Secondary | ICD-10-CM | POA: Diagnosis not present

## 2023-03-18 DIAGNOSIS — M542 Cervicalgia: Secondary | ICD-10-CM | POA: Diagnosis not present

## 2023-03-18 NOTE — Telephone Encounter (Signed)
Spoke with patient this morning, recent thoracic and lumbar MRI imaging does not correlate with weakness and frequent falls. He was recently seen by [name], will wait for results of cervical MRI imaging and nerve study. Would be best to have patient come in for office visit to discuss options.

## 2023-03-20 NOTE — Telephone Encounter (Signed)
Patient said [facility] said they did not get the refill request for propanolol. I let him know I see it was sent but he asked if we could send again

## 2023-03-22 ENCOUNTER — Ambulatory Visit (INDEPENDENT_AMBULATORY_CARE_PROVIDER_SITE_OTHER): Payer: PPO | Admitting: Neurology

## 2023-03-22 DIAGNOSIS — G8929 Other chronic pain: Secondary | ICD-10-CM | POA: Diagnosis not present

## 2023-03-22 DIAGNOSIS — M545 Low back pain, unspecified: Secondary | ICD-10-CM | POA: Diagnosis not present

## 2023-03-22 NOTE — Procedures (Signed)
[facility]  [address], [address]  [place], [place] [address] Tel: [phone] Fax: [phone] Test Date:  03/22/2023  Patient: Jared Griffin DOB: 05/20/1961 Physician: Nita Sickle, DO  Sex: Male Height: 6\' 2"  Ref Phys: Kerin Salen, DO  ID#: [id number]   Technician:    History: This is a 63 year old man referred for evaluation of chronic low back and leg pain.  NCV & EMG Findings: Extensive electrodiagnostic testing of the right lower extremity and additional studies of the left shows:  Bilateral sural and superficial peroneal sensory responses are within normal limits. Left peroneal motor response at the extensor digitorum brevis is reduced, and normal at the tibialis anterior.  Right peroneal and bilateral tibial motor responses are within normal limits. Bilateral H reflex studies are within normal limits. Sparse chronic motor axon loss changes are seen affecting the left left L5 myotome, without accompanying active denervation.  These findings are not present in the right lower extremity.    Impression: Chronic L5 radiculopathy affecting the left lower extremity, very mild. There is no evidence of a large fiber sensorimotor polyneuropathy affecting the lower extremities.   ___________________________ Nita Sickle, DO    Nerve Conduction Studies   Stim Site NR Peak (ms) Norm Peak (ms) O-P Amp (V) Norm O-P Amp  Left Sup Peroneal Anti Sensory (Ant Lat Mall)  32 C  12 cm    3.1 <4.6 5.9 >3  Right Sup Peroneal Anti Sensory (Ant Lat Mall)  32 C  12 cm    2.5 <4.6 4.5 >3  Left Sural Anti Sensory (Lat Mall)  32 C  Calf    3.1 <4.6 4.6 >3  Right Sural Anti Sensory (Lat Mall)  32 C  Calf    3.0 <4.6 4.3 >3     Stim Site NR Onset (ms) Norm Onset (ms) O-P Amp (mV) Norm O-P Amp Site1 Site2 Delta-0 (ms) Dist (cm) Vel (m/s) Norm Vel (m/s)  Left Peroneal Motor (Ext Dig Brev)  32 C  Ankle    5.9 <6.0 *1.0 >2.5 B Fib Ankle 10.7 43.0 40 >40  B Fib    16.6   0.6  Poplt B Fib 2.5 10.0 40 >40  Poplt    19.1  0.7         Right Peroneal Motor (Ext Dig Brev)  32 C  Ankle    3.6 <6.0 6.1 >2.5 B Fib Ankle 8.8 42.0 48 >40  B Fib    12.4  5.0  Poplt B Fib 3.0 10.0 41 >40  Poplt    15.4  5.0         Left Peroneal TA Motor (Tib Ant)  32 C  Fib Head    3.0 <4.5 4.5 >3 Poplit Fib Head 1.6 10.0 63 >40  Poplit    4.6 <5.7 4.4         Left Tibial Motor (Abd Hall Brev)  32 C  Ankle    3.9 <6.0 4.0 >4 Knee Ankle 11.3 45.0 40 >40  Knee    15.2  2.9         Right Tibial Motor (Abd Hall Brev)  32 C  Ankle    3.8 <6.0 4.5 >4 Knee Ankle 9.6 46.0 48 >40  Knee    13.4  3.6          Electromyography   Side Muscle Ins.Act Fibs Fasc Recrt Amp Dur Poly Activation Comment  Right AntTibialis Nml Nml Nml Nml Nml Nml Nml Nml  N/A  Right Gastroc Nml Nml Nml Nml Nml Nml Nml Nml N/A  Right Flex Dig Long Nml Nml Nml Nml Nml Nml Nml Nml N/A  Right RectFemoris Nml Nml Nml Nml Nml Nml Nml Nml N/A  Right BicepsFemS Nml Nml Nml Nml Nml Nml Nml Nml N/A  Right GluteusMed Nml Nml Nml Nml Nml Nml Nml Nml N/A  Left AntTibialis Nml Nml Nml *1- 1+ 1+ 1+ Nml N/A  Left Gastroc Nml Nml Nml Nml Nml Nml Nml Nml N/A  Left Flex Dig Long Nml Nml Nml *1- 1+ 1+ 1+ Nml N/A  Left RectFemoris Nml Nml Nml Nml Nml Nml Nml Nml N/A  Left GluteusMed Nml Nml Nml Nml Nml Nml Nml Nml N/A  Left BicepsFemS Nml Nml Nml Nml Nml Nml Nml Nml N/A      Waveforms:

## 2023-03-27 ENCOUNTER — Ambulatory Visit: Payer: PPO | Admitting: Family Medicine

## 2023-03-28 ENCOUNTER — Encounter: Payer: Self-pay | Admitting: Physical Medicine and Rehabilitation

## 2023-03-29 ENCOUNTER — Encounter: Payer: PPO | Admitting: Neurology

## 2023-04-04 ENCOUNTER — Other Ambulatory Visit: Payer: Self-pay | Admitting: Cardiology

## 2023-04-05 ENCOUNTER — Ambulatory Visit (INDEPENDENT_AMBULATORY_CARE_PROVIDER_SITE_OTHER): Payer: PPO | Admitting: Physical Medicine and Rehabilitation

## 2023-04-05 DIAGNOSIS — M7918 Myalgia, other site: Secondary | ICD-10-CM

## 2023-04-05 DIAGNOSIS — G894 Chronic pain syndrome: Secondary | ICD-10-CM

## 2023-04-05 DIAGNOSIS — M545 Low back pain, unspecified: Secondary | ICD-10-CM

## 2023-04-05 DIAGNOSIS — G8929 Other chronic pain: Secondary | ICD-10-CM

## 2023-04-05 DIAGNOSIS — M546 Pain in thoracic spine: Secondary | ICD-10-CM | POA: Diagnosis not present

## 2023-04-05 MED ORDER — TRAMADOL HCL 50 MG PO TABS: 50.0000 mg | ORAL_TABLET | Freq: Three times a day (TID) | ORAL | 0 refills | Status: AC | PRN

## 2023-04-05 NOTE — Progress Notes (Signed)
Functional Pain Scale - descriptive words and definitions  Distressing (6)    Pain is present/unable to complete most ADLs limited by pain/sleep is difficult and active distraction is only marginal. Moderate range order  Average Pain 8  Lower back pain in the middle that radiates to the sides and down both legs

## 2023-04-05 NOTE — Progress Notes (Signed)
Jared Griffin - 62 y.o. male MRN [id number]  Date of birth: [birth year]  Office Visit Note: Visit Date: 04/05/2023 PCP: Babs Sciara, MD Referred by: Babs Sciara, MD  Subjective: Chief Complaint  Patient presents with   Lower Back - Pain   HPI: Jared Griffin is a 62 y.o. male who comes in today for evaluation of upper back pain radiating down to bilateral middle and lower back. Reports intermittent weakness to bilateral legs. Jared Griffin was first evaluated in our office in 2021. Since then we have We have treated him for more lower back pain, no  true radicular symptoms radiating to legs.  He does have history of more chronic pain with diffuse musculoskeletal pain to entire back. History of physical therapy/chiropractic treatments, dry needling and chiropractic treatments, all provide some short term relief of pain. He has undergone multiple lumbar epidural steroid injections in our office, most recent right L3-L4 interlaminar epidural steroid injection on 11/15/2022, intermittent relief of pain with these procedures. We also performed lumbar facet joint injections in 2021 that provided minimal short term relief of pain. Lumbar MRI imaging from May of 2024 exhibits left annual tear at L1-L2, left lateral disc protrusion at L2-L3, leftward disc protrusion at L4-L5, no high grade spinal canal stenosis noted. Recent thoracic MRI imaging exhibits up to mild foraminal narrowing bilaterally at T10-11. Recent cervical MRI imaging exhibits C5-C6 disc protrusion increased from April 2023 causing mild spinal stenosis with ventral cord indentation. No high grade spinal canal stenosis.   Patient was recently referred to [name] with [facility] by his PCP for possible degenerative neurological condition. Patient reports recent falls and intermittent lower extremity weakness. Of note, he was previously evaluated by [name] in 2022 for tremors. Recent NCV with EMG of bilateral lower  extremities shows chronic L5 radiculopathy affecting the left lower extremity, very mild. Per [name] notes positive right sided Hoffman's, but no myelopathy, no evidence of neurodegenerative process.   History of diabetes mellitus, CAD, hypertension, generalized anxiety disorder, depression and obesity. He does take Xanax, Zoloft and Trazodone prescribed by his psychiatrist [name].   Review of Systems  Musculoskeletal:  Positive for back pain, myalgias and neck pain.  Neurological:  Positive for tremors and focal weakness.  All other systems reviewed and are negative.  Otherwise per HPI.  Assessment & Plan: Visit Diagnoses:    ICD-10-CM   1. Chronic bilateral thoracic back pain  M54.6 Ambulatory referral to Physical Therapy   G89.29     2. Chronic bilateral low back pain without sciatica  M54.50 Ambulatory referral to Physical Therapy   G89.29     3. Myofascial pain syndrome  M79.18 Ambulatory referral to Physical Therapy    4. Chronic pain syndrome  G89.4        Plan: Findings:  Chronic recalcitrant upper back pain radiating down to bilateral middle and lower back. Intermittent weakness to bilateral lower extremities. Patient continues to have severe pain despite good conservative therapies such as formal physical therapy/chiropractic treatments, home exercise regimen, rest and use of medications. Patient's clinical presentation and exam are complex, his symptoms are not radicular in nature, no pain radiating down legs or arms. His exam of cervical and lumbar spine are non focal, good strength noted to bilateral upper and lower extremities, no myelopathic symptoms. Could also be a type of chronic pain syndrome or central sensitization syndrome such as fibromyalgia. [name] and I have discussed his case  in detail, his symptoms do not directly correlate with spine imaging and seem to more myofascial in nature. There are no spine findings that would explain frequent  falls/weakness to lower extremities. We discussed care plan today in detail, at this point I am not sure we have any other treatments to offer. I placed referral for him to re-group with our in house physical therapy team, I do feel he would benefit from several sessions of dry needling.   I discussed medication management and was considering Cymbalta, however is already taking Zoloft. This could be an options should he discontinue Zoloft in the future. He can speak with his psychiatrist about this further. Patient did inquire about pain medication today. I did prescribe short course of Tramadol, however I explained that we do not treat chronic pain with long term opioid medications in our office. If he desires to stay on opioid medications long term we can look at referral to pain management practice. Other options would include referral to more comprehensive pain management center such as  [facility] in [place]. Patient verbalized understanding, he has no questions at this time. No red flag symptoms noted upon exam today.    Dr. Alvester Morin participated with direct patient care including clinical review, exam when needed and significant portion of diagnostic and treatment plan.             Meds & Orders:  Meds ordered this encounter  Medications   traMADol (ULTRAM) 50 MG tablet    Sig: Take 1 tablet (50 mg total) by mouth every 8 (eight) hours as needed for moderate pain or severe pain.    Dispense:  20 tablet    Refill:  0    Orders Placed This Encounter  Procedures   Ambulatory referral to Physical Therapy    Follow-up: Return if symptoms worsen or fail to improve.   Procedures: No procedures performed      Clinical History: CLINICAL DATA:  Low back pain, symptoms persist with greater than 6 weeks of treatment.   EXAM: MRI LUMBAR SPINE WITHOUT CONTRAST   TECHNIQUE: Multiplanar, multisequence MR imaging of the lumbar spine was performed. No intravenous  contrast was administered.   COMPARISON:  MRI of the lumbar spine 10/10/19.   FINDINGS: Segmentation: 5 non rib-bearing lumbar type vertebral bodies are present. The lowest fully formed vertebral body is L5.   Alignment:  Slight retrolisthesis at L3-4 is stable.   Vertebrae: Prominent hemangioma at L3 is stable. Marrow signal and vertebral body heights are otherwise normal.   Conus medullaris and cauda equina: Conus extends to the L1 level. Conus and cauda equina appear normal.   Paraspinal and other soft tissues: A 13 mm simple cyst is present in the right kidney. No other focal lesions are present. Recommend no imaging follow-up for this lesion.   Disc levels:   T12-L1: Normal disc signal and height is present. No focal protrusion or stenosis is present.   L1-2: Far left annular tear is new since the prior study. No compressive stenosis is present.   L2-3: A far left lateral disc protrusion is new since the prior study. No focal stenosis is present.   L4-5: Disc desiccation and a leftward disc protrusion is present. This has progressed some. Mild left subarticular and foraminal stenosis has progressed. The right foramen is patent.   L5-S1: Normal disc signal and height is present. No focal protrusion or stenosis is present.   IMPRESSION: 1. Progressive leftward disc protrusion at L4-5  with mild left subarticular and foraminal stenosis. 2. New far left lateral disc protrusion at L2-3 without focal stenosis. 3. Far left annular tear at L1-2 is new since the prior study. No compressive stenosis is present.     Electronically Signed   By: Marin Roberts M.D.   On: 02/03/2023 09:50   He reports that he quit smoking about 39 years ago. His smoking use included cigarettes. He started smoking about 50 years ago. He has a 5.3 pack-year smoking history. He quit smokeless tobacco use about 39 years ago.  His smokeless tobacco use included chew. No results for input(s):  "HGBA1C", "LABURIC" in the last 8760 hours.  Objective:  VS:  HT:    WT:   BMI:     BP:   HR: bpm  TEMP: ( )  RESP:  Physical Exam Vitals and nursing note reviewed.  HENT:     Head: Normocephalic and atraumatic.     Right Ear: External ear normal.     Left Ear: External ear normal.     Nose: Nose normal.     Mouth/Throat:     Mouth: Mucous membranes are moist.  Eyes:     Extraocular Movements: Extraocular movements intact.  Cardiovascular:     Rate and Rhythm: Normal rate.     Pulses: Normal pulses.  Pulmonary:     Effort: Pulmonary effort is normal.  Abdominal:     General: Abdomen is flat. There is no distension.  Musculoskeletal:        General: Tenderness present.     Cervical back: Tenderness present.     Comments: No discomfort noted with flexion, extension and side-to-side rotation. Patient has good strength in the upper extremities including 5 out of 5 strength in wrist extension, long finger flexion and APB. Shoulder range of motion is full bilaterally without any sign of impingement. There is no atrophy of the hands intrinsically. Sensation intact bilaterally. Negative Hoffman's sign. Negative Spurling's sign.   Patient rises from seated position to standing without difficulty. Good lumbar range of motion. No pain noted with facet loading. 5/5 strength noted with bilateral hip flexion, knee flexion/extension, ankle dorsiflexion/plantarflexion and EHL. No clonus noted bilaterally. No pain upon palpation of greater trochanters. No pain with internal/external rotation of bilateral hips. Sensation intact bilaterally. Tenderness noted to bilateral lumbar paraspinal regions. Negative slump test bilaterally. Ambulates without aid, gait steady.       Skin:    General: Skin is warm and dry.     Capillary Refill: Capillary refill takes less than 2 seconds.  Neurological:     General: No focal deficit present.     Mental Status: He is alert and oriented to person, place, and  time.  Psychiatric:        Mood and Affect: Mood normal.        Behavior: Behavior normal.     Ortho Exam  Imaging: No results found.  Past Medical/Family/Surgical/Social History: Medications & Allergies reviewed per EMR, new medications updated. Patient Active Problem List   Diagnosis Date Noted   Aortic atherosclerosis (HCC) 01/17/2023   Dysphagia 12/17/2022   Moderate single current episode of major depressive disorder (HCC) 09/26/2022   Coronary artery disease involving native coronary artery of native heart with angina pectoris (HCC) 09/26/2022   Atypical chest pain 03/06/2022   Diarrhea 12/14/2021   Iron deficiency anemia 08/15/2021   Orthostatic hypotension 12/07/2020   IBS (irritable bowel syndrome) 11/17/2020   Chronic diarrhea 08/17/2020   NASH (  nonalcoholic steatohepatitis) 08/17/2020   Elevated LFTs 08/17/2020   Tremor of both hands 05/06/2020   Bilateral occipital neuralgia 05/06/2020   Abdominal pain, epigastric 01/13/2019   Anxiety 01/04/2019   Radicular pain of left lower extremity 12/22/2018   Moderate persistent asthma 10/25/2018   Dizziness 07/12/2018   Nonintractable headache    Abnormal nuclear stress test    Aftercare following surgery 07/25/17 08/13/2017   Hx of colonic polyps 08/07/2017   Lateral epicondylitis of right elbow    Cervical nerve root impingement 12/09/2015   Generalized anxiety disorder 09/27/2015   Pain in the chest    Depression 01/24/2015   Esophageal reflux 01/24/2015   Preoperative cardiovascular examination 06/28/2014   DM type 2 causing vascular disease (HCC)    Mixed hyperlipidemia    Obesity    Intermediate coronary syndrome (HCC) 12/09/2013   Lumbago 05/21/2012   Essential hypertension, benign 12/28/2009   CAD S/P LAD DES March 2015 12/28/2009   Hyperlipidemia LDL goal <70 11/25/2009   Accelerating angina (HCC) 11/25/2009   DM2 (diabetes mellitus, type 2) (HCC) 11/24/2009   ABDOMINAL PAIN, HX OF 11/24/2009   Past  Medical History:  Diagnosis Date   Anxiety    COPD (chronic obstructive pulmonary disease) (HCC)    Coronary atherosclerosis of native coronary artery    a. 12/09/2013: s/p PCI with 3.0 x 28 Promus DES to mLAD   Depression    Diabetic neuropathy (HCC)    Elbow injury 07/25/2017   Surgery for torn tendon   Essential hypertension    Fatty liver disease, nonalcoholic    GERD (gastroesophageal reflux disease)    Iron deficiency anemia 08/15/2021   Lateral epicondylitis of right elbow    Mixed hyperlipidemia    Obesity    Osgood-Schlatter's disease of right knee    Skin cyst 10/03/2017   Removed from back   Type 2 diabetes mellitus (HCC)    Family History  Problem Relation Age of Onset   Osteoporosis Mother    Cancer Maternal Aunt    CVA Maternal Grandmother    CVA Maternal Grandfather    Healthy Son    Past Surgical History:  Procedure Laterality Date   BIOPSY  01/21/2019   Procedure: BIOPSY;  Surgeon: Malissa Hippo, MD;  Location: [facility];  Service: Endoscopy;;  gastric bx and gastric polyps   BIOPSY  11/08/2020   Procedure: BIOPSY;  Surgeon: Dolores Frame, MD;  Location: [facility];  Service: Gastroenterology;;   CARDIAC CATHETERIZATION  2011   CARDIAC CATHETERIZATION N/A 09/20/2015   Procedure: Left Heart Cath and Coronary Angiography;  Surgeon: Kathleene Hazel, MD;  Location: [facility] INVASIVE CV LAB;  Service: Cardiovascular;  Laterality: N/A;   CHOLECYSTECTOMY  2003   COLONOSCOPY  06/19/2012   Procedure: COLONOSCOPY;  Surgeon: Malissa Hippo, MD;  Location: [facility];  Service: Endoscopy;  Laterality: N/A;  930   COLONOSCOPY N/A 10/17/2017   Procedure: COLONOSCOPY;  Surgeon: Malissa Hippo, MD;  Location: [facility];  Service: Endoscopy;  Laterality: N/A;  1225   COLONOSCOPY WITH PROPOFOL N/A 11/08/2020   Procedure: COLONOSCOPY WITH PROPOFOL;  Surgeon: Dolores Frame, MD;  Location: [facility];  Service: Gastroenterology;   Laterality: N/A;  10:45   COLONOSCOPY WITH PROPOFOL N/A 04/04/2021   Procedure: COLONOSCOPY WITH PROPOFOL;  Surgeon: Dolores Frame, MD;  Location: [facility];  Service: Gastroenterology;  Laterality: N/A;   CORONARY PRESSURE/FFR STUDY N/A 04/10/2022   Procedure: INTRAVASCULAR  PRESSURE WIRE/FFR STUDY;  Surgeon: Lyn Records, MD;  Location: [facility] INVASIVE CV LAB;  Service: Cardiovascular;  Laterality: N/A;   CYST EXCISION  07/2019   ESOPHAGOGASTRODUODENOSCOPY (EGD) WITH PROPOFOL N/A 01/21/2019   Procedure: ESOPHAGOGASTRODUODENOSCOPY (EGD) WITH PROPOFOL;  Surgeon: Malissa Hippo, MD;  Location: [facility];  Service: Endoscopy;  Laterality: N/A;  10:15am   ESOPHAGOGASTRODUODENOSCOPY (EGD) WITH PROPOFOL N/A 02/17/2021   Procedure: ESOPHAGOGASTRODUODENOSCOPY (EGD) WITH PROPOFOL;  Surgeon: Dolores Frame, MD;  [facility]: [facility];  Service: Gastroenterology;  Laterality: N/A;  10:20   ESOPHAGOGASTRODUODENOSCOPY (EGD) WITH PROPOFOL N/A 04/04/2021   Procedure: ESOPHAGOGASTRODUODENOSCOPY (EGD) WITH PROPOFOL;  Surgeon: Dolores Frame, MD;  [facility]: [facility];  Service: Gastroenterology;  Laterality: N/A;  12:30   GIVENS CAPSULE STUDY N/A 09/13/2021   Procedure: GIVENS CAPSULE STUDY;  Surgeon: Dolores Frame, MD;  Location: [facility];  Service: Gastroenterology;  Laterality: N/A;  7:30   KNEE ARTHROPLASTY Right 1999   LEFT HEART CATH AND CORONARY ANGIOGRAPHY N/A 02/26/2018   Procedure: LEFT HEART CATH AND CORONARY ANGIOGRAPHY;  Surgeon: Corky Crafts, MD;  Location: [facility] INVASIVE CV LAB;  Service: Cardiovascular;  Laterality: N/A;   LEFT HEART CATH AND CORONARY ANGIOGRAPHY N/A 04/10/2022   Procedure: LEFT HEART CATH AND CORONARY ANGIOGRAPHY;  Surgeon: Lyn Records, MD;  Location: [facility] INVASIVE CV LAB;  Service: Cardiovascular;  Laterality: N/A;   LEFT HEART CATHETERIZATION WITH CORONARY ANGIOGRAM N/A 12/09/2013   Procedure: LEFT HEART  CATHETERIZATION WITH CORONARY ANGIOGRAM;  Surgeon: Corky Crafts, MD;  Location: [facility] CATH LAB;  Service: Cardiovascular;  Laterality: N/A;   Liver biopsy     POLYPECTOMY  10/17/2017   Procedure: POLYPECTOMY;  Surgeon: Malissa Hippo, MD;  Location: [facility];  Service: Endoscopy;;  ascending colon, splenic flexure,sigmoid x2   POLYPECTOMY  11/08/2020   Procedure: POLYPECTOMY;  Surgeon: Dolores Frame, MD;  Location: [facility];  Service: Gastroenterology;;   POLYPECTOMY  04/04/2021   Procedure: POLYPECTOMY;  Surgeon: Dolores Frame, MD;  Location: [facility];  Service: Gastroenterology;;   TENNIS ELBOW RELEASE/NIRSCHEL PROCEDURE Right 07/25/2017   Procedure: TENNIS ELBOW RELEASE and debriedment;  Surgeon: Vickki Hearing, MD;  Location: [facility];  Service: Orthopedics;  Laterality: Right;   TONSILLECTOMY  1970's   Social History   Occupational History   Not on file  Tobacco Use   Smoking status: Former    Current packs/day: 0.00    Average packs/day: 0.5 packs/day for 10.6 years (5.3 ttl pk-yrs)    Types: Cigarettes    Start date: 02/02/1973    Quit date: 09/17/1983    Years since quitting: 39.6   Smokeless tobacco: Former    Types: Chew    Quit date: 09/17/1983  Vaping Use   Vaping status: Never Used  Substance and Sexual Activity   Alcohol use: No    Alcohol/week: 0.0 standard drinks of alcohol   Drug use: No   Sexual activity: Yes

## 2023-04-07 ENCOUNTER — Encounter: Payer: Self-pay | Admitting: Physical Medicine and Rehabilitation

## 2023-04-15 ENCOUNTER — Encounter (HOSPITAL_COMMUNITY): Payer: Self-pay | Admitting: Psychiatry

## 2023-04-15 ENCOUNTER — Telehealth (INDEPENDENT_AMBULATORY_CARE_PROVIDER_SITE_OTHER): Payer: PPO | Admitting: Psychiatry

## 2023-04-15 DIAGNOSIS — F419 Anxiety disorder, unspecified: Secondary | ICD-10-CM

## 2023-04-15 DIAGNOSIS — F321 Major depressive disorder, single episode, moderate: Secondary | ICD-10-CM

## 2023-04-15 DIAGNOSIS — F411 Generalized anxiety disorder: Secondary | ICD-10-CM

## 2023-04-15 DIAGNOSIS — F32A Depression, unspecified: Secondary | ICD-10-CM

## 2023-04-15 MED ORDER — ALPRAZOLAM 1 MG PO TABS: 1.0000 mg | ORAL_TABLET | Freq: Three times a day (TID) | ORAL | 3 refills | Status: DC | PRN

## 2023-04-15 MED ORDER — SERTRALINE HCL 100 MG PO TABS: 150.0000 mg | ORAL_TABLET | Freq: Every day | ORAL | 3 refills | Status: DC

## 2023-04-15 NOTE — Progress Notes (Signed)
Virtual Visit via Video Note  I connected with Jared Griffin on 04/15/23 at  9:20 AM EDT by a video enabled telemedicine application and verified that I am speaking with the correct person using two identifiers.  Location: Patient: home Provider: office   I discussed the limitations of evaluation and management by telemedicine and the availability of in person appointments. The patient expressed understanding and agreed to proceed.     I discussed the assessment and treatment plan with the patient. The patient was provided an opportunity to ask questions and all were answered. The patient agreed with the plan and demonstrated an understanding of the instructions.   The patient was advised to call back or seek an in-person evaluation if the symptoms worsen or if the condition fails to improve as anticipated.  I provided 15 minutes of non-face-to-face time during this encounter.   [name], MD  [facility] MD/PA/NP OP Progress Note  04/15/2023 9:23 AM Jared Griffin  MRN:  [id number]  Chief Complaint:  Chief Complaint  Patient presents with   Depression   Anxiety   Follow-up   HPI: This patient is a 62 year old married white male lives with his wife and son in [place]. 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 He had also been working at [employer] but had an arm injury at work and had to quit there as well.   The patient returns for follow-up after 4 months regarding his depression and anxiety.  He states that he had a rough time a couple months ago and his upper middle and lower back pain worsened.  This seemed to be after he done some lawn work including weed eating.  He was having severe spasms in his upper back and even lost his balance a couple of times because of pain in the lower back.  He has had several MRIs which do not explain the extent of the pain.  He was seen by orthopedics to think it could be  muscular skeletal in nature.  He was given a small amount of tramadol.  He states right now the pain is better than it was and he is able to get back to his lawn work.  He is scheduled to start physical therapy soon.  Overall his mood has been good and he is sleeping well again.  He is no longer using the trazodone because it makes him too groggy the next day.  He denies significant depression or thoughts of self-harm.  The Xanax continues to help his anxiety Visit Diagnosis:    ICD-10-CM   1. Moderate single current episode of major depressive disorder (HCC)  F32.1     2. Generalized anxiety disorder  F41.1       Past Psychiatric History: none  Past Medical History:  Past Medical History:  Diagnosis Date   Anxiety    COPD (chronic obstructive pulmonary disease) (HCC)    Coronary atherosclerosis of native coronary artery    a. 12/09/2013: s/p PCI with 3.0 x 28 Promus DES to mLAD   Depression    Diabetic neuropathy (HCC)    Elbow injury 07/25/2017   Surgery for torn tendon   Essential hypertension    Fatty liver disease, nonalcoholic    GERD (gastroesophageal reflux disease)    Iron deficiency anemia 08/15/2021   Lateral epicondylitis of right elbow    Mixed hyperlipidemia    Obesity    Osgood-Schlatter's disease of  right knee    Skin cyst 10/03/2017   Removed from back   Type 2 diabetes mellitus [facility])     Past Surgical History:  Procedure Laterality Date   BIOPSY  01/21/2019   Procedure: BIOPSY;  Surgeon: Malissa Hippo, MD;  [facility];  Service: Endoscopy;;  gastric bx and gastric polyps   BIOPSY  11/08/2020   Procedure: BIOPSY;  Surgeon: Dolores Frame, MD;  [facility];  Service: Gastroenterology;;   CARDIAC CATHETERIZATION  2011   CARDIAC CATHETERIZATION N/A 09/20/2015   Procedure: Left Heart Cath and Coronary Angiography;  Surgeon: Kathleene Hazel, MD;  Location: [facility] INVASIVE CV LAB;  Service: Cardiovascular;  Laterality: N/A;    CHOLECYSTECTOMY  2003   COLONOSCOPY  06/19/2012   Procedure: COLONOSCOPY;  Surgeon: Malissa Hippo, MD;  [facility];  Service: Endoscopy;  Laterality: N/A;  930   COLONOSCOPY N/A 10/17/2017   Procedure: COLONOSCOPY;  Surgeon: Malissa Hippo, MD;  [facility];  Service: Endoscopy;  Laterality: N/A;  1225   COLONOSCOPY WITH PROPOFOL N/A 11/08/2020   Procedure: COLONOSCOPY WITH PROPOFOL;  Surgeon: Dolores Frame, MD;  [facility];  Service: Gastroenterology;  Laterality: N/A;  10:45   COLONOSCOPY WITH PROPOFOL N/A 04/04/2021   Procedure: COLONOSCOPY WITH PROPOFOL;  Surgeon: Dolores Frame, MD;  [facility];  Service: Gastroenterology;  Laterality: N/A;   CORONARY PRESSURE/FFR STUDY N/A 04/10/2022   Procedure: INTRAVASCULAR PRESSURE WIRE/FFR STUDY;  Surgeon: Lyn Records, MD;  Location: [facility] INVASIVE CV LAB;  Service: Cardiovascular;  Laterality: N/A;   CYST EXCISION  07/2019   ESOPHAGOGASTRODUODENOSCOPY (EGD) WITH PROPOFOL N/A 01/21/2019   Procedure: ESOPHAGOGASTRODUODENOSCOPY (EGD) WITH PROPOFOL;  Surgeon: Malissa Hippo, MD;  [facility];  Service: Endoscopy;  Laterality: N/A;  10:15am   ESOPHAGOGASTRODUODENOSCOPY (EGD) WITH PROPOFOL N/A 02/17/2021   Procedure: ESOPHAGOGASTRODUODENOSCOPY (EGD) WITH PROPOFOL;  Surgeon: Dolores Frame, MD;  [facility];  Service: Gastroenterology;  Laterality: N/A;  10:20   ESOPHAGOGASTRODUODENOSCOPY (EGD) WITH PROPOFOL N/A 04/04/2021   Procedure: ESOPHAGOGASTRODUODENOSCOPY (EGD) WITH PROPOFOL;  Surgeon: Dolores Frame, MD;  [facility];  Service: Gastroenterology;  Laterality: N/A;  12:30   GIVENS CAPSULE STUDY N/A 09/13/2021   Procedure: GIVENS CAPSULE STUDY;  Surgeon: Dolores Frame, MD;  [facility];  Service: Gastroenterology;  Laterality: N/A;  7:30   KNEE ARTHROPLASTY Right 1999   LEFT HEART CATH AND CORONARY  ANGIOGRAPHY N/A 02/26/2018   Procedure: LEFT HEART CATH AND CORONARY ANGIOGRAPHY;  Surgeon: Corky Crafts, MD;  Location: [facility] INVASIVE CV LAB;  Service: Cardiovascular;  Laterality: N/A;   LEFT HEART CATH AND CORONARY ANGIOGRAPHY N/A 04/10/2022   Procedure: LEFT HEART CATH AND CORONARY ANGIOGRAPHY;  Surgeon: Lyn Records, MD;  Location: [facility] INVASIVE CV LAB;  Service: Cardiovascular;  Laterality: N/A;   LEFT HEART CATHETERIZATION WITH CORONARY ANGIOGRAM N/A 12/09/2013   Procedure: LEFT HEART CATHETERIZATION WITH CORONARY ANGIOGRAM;  Surgeon: Corky Crafts, MD;  Location: [facility] CATH LAB;  Service: Cardiovascular;  Laterality: N/A;   Liver biopsy     POLYPECTOMY  10/17/2017   Procedure: POLYPECTOMY;  Surgeon: Malissa Hippo, MD;  [facility];  Service: Endoscopy;;  ascending colon, splenic flexure,sigmoid x2   POLYPECTOMY  11/08/2020   Procedure: POLYPECTOMY;  Surgeon: Dolores Frame, MD;  [facility];  Service: Gastroenterology;;   POLYPECTOMY  04/04/2021  Procedure: POLYPECTOMY;  Surgeon: Dolores Frame, MD;  [facility];  Service: Gastroenterology;;   TENNIS ELBOW RELEASE/NIRSCHEL PROCEDURE Right 07/25/2017   Procedure: TENNIS ELBOW RELEASE and debriedment;  Surgeon: Vickki Hearing, MD;  Location: [facility];  Service: Orthopedics;  Laterality: Right;   TONSILLECTOMY  1970's    Family Psychiatric History: See below  Family History:  Family History  Problem Relation Age of Onset   Osteoporosis Mother    Cancer Maternal Aunt    CVA Maternal Grandmother    CVA Maternal Grandfather    Healthy Son     Social History:  Social History   Socioeconomic History   Marital status: Married    Spouse name: Lance Bosch   Number of children: 1   Years of education: Not on file   Highest education level: Not on file  Occupational History   Not on file  Tobacco Use   Smoking status: Former    Current packs/day: 0.00    Average  packs/day: 0.5 packs/day for 10.6 years (5.3 ttl pk-yrs)    Types: Cigarettes    Start date: 02/02/1973    Quit date: 09/17/1983    Years since quitting: 39.6   Smokeless tobacco: Former    Types: Chew    Quit date: 09/17/1983  Vaping Use   Vaping status: Never Used  Substance and Sexual Activity   Alcohol use: No    Alcohol/week: 0.0 standard drinks of alcohol   Drug use: No   Sexual activity: Yes  Other Topics Concern   Not on file  Social History Narrative   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    Married with 1 child-son   Right handed    12 th    Very little caffeine   One story 12 stairs up to door and 8 steps to front door   Social Determinants of Health   Financial Resource Strain: Low Risk  (01/10/2023)   Overall Financial Resource Strain (CARDIA)    Difficulty of Paying Living Expenses: Not hard at all  Food Insecurity: No Food Insecurity (08/08/2021)   Hunger Vital Sign    Worried About Running Out of Food in the Last Year: Never true    Ran Out of Food in the Last Year: Never true  Transportation Needs: Unknown (01/10/2023)   PRAPARE - Administrator, Civil Service (Medical): Patient declined    Lack of Transportation (Non-Medical): Not on file  Physical Activity: Insufficiently Active (01/10/2023)   Exercise Vital Sign    Days of Exercise per Week: 3 days    Minutes of Exercise per Session: 40 min  Stress: Stress Concern Present (01/10/2023)   [employer] of Occupational Health - Occupational Stress Questionnaire    Feeling of Stress : To some extent  Social Connections: Unknown (01/10/2023)   Social Connection and Isolation Panel [NHANES]    Frequency of Communication with Friends and Family: Not on file    Frequency of Social Gatherings with Friends and Family: Not on file    Attends Religious Services: Not on file    Active Member of Clubs or Organizations: Patient declined    Attends Occupational hygienist Meetings: Not on file    Marital Status: Patient declined    Allergies:  Allergies  Allergen Reactions   Bydureon [Exenatide] Diarrhea    Severe diarrhea and nausea   Ozempic (0.25 Or 0.5 Mg-Dose) [Semaglutide(0.25 Or 0.5mg -Dos)] Diarrhea  Severe nausea and diarrhea   Doxycycline     Severe esophagitis    Metabolic Disorder Labs: Lab Results  Component Value Date   HGBA1C 7.5 (H) 02/07/2022   MPG 211.6 07/12/2018   MPG 197.25 07/23/2017   No results found for: "PROLACTIN" Lab Results  Component Value Date   CHOL 117 06/26/2022   TRIG 238 (H) 06/26/2022   HDL 33 (L) 06/26/2022   CHOLHDL 3.5 06/26/2022   VLDL UNABLE TO CALCULATE IF TRIGLYCERIDE OVER 400 mg/dL 54/05/8118   LDLCALC 46 06/26/2022   LDLCALC 94 02/07/2022   Lab Results  Component Value Date   TSH 0.648 08/20/2019   TSH 0.65 08/20/2019    Therapeutic Level Labs: No results found for: "LITHIUM" No results found for: "VALPROATE" No results found for: "CBMZ"  Current Medications: Current Outpatient Medications  Medication Sig Dispense Refill   acetaminophen (TYLENOL) 500 MG tablet Take 1,500 mg by mouth daily as needed for moderate pain.     albuterol (VENTOLIN HFA) 108 (90 Base) MCG/ACT inhaler INHALE 2 PUFFS EVERY 6 HOURS 8.5 g 5   ALPRAZolam (XANAX) 1 MG tablet Take 1 tablet (1 mg total) by mouth 3 (three) times daily as needed for anxiety. 90 tablet 3   amLODipine (NORVASC) 5 MG tablet Take 1 tablet (5 mg total) by mouth daily. 90 tablet 3   aspirin 81 MG EC tablet Take 1 tablet (81 mg total) by mouth daily. 30 tablet 12   atorvastatin (LIPITOR) 40 MG tablet TAKE ONE TABLET (40MG  TOTAL) BY MOUTH DAILY 90 tablet 1   B-D UF III MINI PEN NEEDLES 31G X 5 MM MISC USE UP TO 4 TIMES DAILY WITH INSULIN AS DIRECTED. 100 each 3   colestipol (COLESTID) 1 g tablet Take 2 tablets (2 g total) by mouth daily. Take 4 hours apart from other medication (Patient taking differently: Take 1 g by mouth  daily as needed (diarrhea). Take 4 hours apart from other medication) 180 tablet 3   Continuous Blood Gluc Sensor (FREESTYLE LIBRE 14 DAY SENSOR) MISC Inject 1 each into the skin every 14 (fourteen) days. Use as directed. 2 each 2   dapagliflozin propanediol (FARXIGA) 5 MG TABS tablet Take 10 mg by mouth daily.     ezetimibe (ZETIA) 10 MG tablet TAKE ONE TABLET (10MG  TOTAL) BY MOUTH DAILY 30 tablet 5   fluticasone (FLONASE) 50 MCG/ACT nasal spray Place 2 sprays into both nostrils daily. (Patient taking differently: Place 2 sprays into both nostrils daily as needed for allergies.) 16 g 0   fluticasone-salmeterol (ADVAIR) 250-50 MCG/ACT AEPB Inhale 1 puff into the lungs in the morning and at bedtime. 1 each 5   gabapentin (NEURONTIN) 300 MG capsule TAKE 2 CAPSULES BY MOUTH EACH MORNING AND 2 CAPSULE EACH EVENING. 120 capsule 6   HUMALOG KWIKPEN 200 UNIT/ML KwikPen Inject 30-55 Units into the skin 3 (three) times daily before meals.     isosorbide dinitrate (ISORDIL) 30 MG tablet Take 30 mg by mouth daily at 6 (six) AM.     Lancets MISC 1 each by Does not apply route 4 (four) times daily. 150 each 5   loratadine (CLARITIN) 10 MG tablet Take 10 mg by mouth daily as needed for allergies.     losartan (COZAAR) 25 MG tablet TAKE ONE (1) TABLET BY MOUTH EVERY DAY 90 tablet 3   metFORMIN (GLUCOPHAGE-XR) 500 MG 24 hr tablet Take 1,000 mg by mouth 2 (two) times daily.     nitroGLYCERIN (NITROSTAT)  0.4 MG SL tablet Place 1 tablet (0.4 mg total) under the tongue every 5 (five) minutes as needed. 25 tablet 3   omeprazole (PRILOSEC) 40 MG capsule TAKE ONE CAPSULE (40MG  TOTAL) BY MOUTH DAILY 90 capsule 3   ondansetron (ZOFRAN-ODT) 8 MG disintegrating tablet TAKE ONE TABLET (8MG  TOTAL) BY MOUTH EVERY 8 HOURS AS NEEDED FRO NAUSEA OR VOMITING 30 tablet 5   propranolol (INDERAL) 20 MG tablet TAKE ONE TABLET (20MG  TOTAL) BY MOUTH TWO TIMES DAILY 90 tablet 2   ranolazine (RANEXA) 1000 MG SR tablet TAKE ONE TABLET (1000MG   TOTAL) BY MOUTH TWO TIMES DAILY 180 tablet 1   sertraline (ZOLOFT) 100 MG tablet Take 1.5 tablets (150 mg total) by mouth daily. 135 tablet 3   TOUJEO MAX SOLOSTAR 300 UNIT/ML Solostar Pen INJECT 130 UNITS INTO THE SKIN AT BEDTIME. (Patient taking differently: Inject 50-55 Units into the skin at bedtime.) 12 mL 0   traMADol (ULTRAM) 50 MG tablet Take 1 tablet (50 mg total) by mouth every 8 (eight) hours as needed for moderate pain or severe pain. 20 tablet 0   No current facility-administered medications for this visit.     Musculoskeletal: Strength & Muscle Tone: within normal limits Gait & Station: normal Patient leans: N/A  Psychiatric Specialty Exam: Review of Systems  Musculoskeletal:  Positive for back pain and neck pain.  All other systems reviewed and are negative.   There were no vitals taken for this visit.There is no height or weight on file to calculate BMI.  General Appearance: Casual and Fairly Groomed  Eye Contact:  Good  Speech:  Clear and Coherent  Volume:  Normal  Mood:  Euthymic  Affect:  Congruent  Thought Process:  Goal Directed  Orientation:  Full (Time, Place, and Person)  Thought Content: WDL   Suicidal Thoughts:  No  Homicidal Thoughts:  No  Memory:  Immediate;   Good Recent;   Good Remote;   Good  Judgement:  Good  Insight:  Fair  Psychomotor Activity:  Normal  Concentration:  Concentration: Good and Attention Span: Good  Recall:  Good  Fund of Knowledge: Good  Language: Good  Akathisia:  No  Handed:  Right  AIMS (if indicated): not done  Assets:  Communication Skills Desire for Improvement Resilience Social Support Talents/Skills  ADL's:  Intact  Cognition: WNL  Sleep:  Good   Screenings: GAD-7    Flowsheet Row Office Visit from 08/08/2020 in [facility] Office Visit from 08/05/2018 in [facility]  Total GAD-7 Score 3 5      PHQ2-9    Flowsheet Row Office Visit from 09/26/2022 in [facility] Office Visit from 05/22/2022 in [facility] Video Visit from 04/05/2022 in [facility] [place] Video Visit from 12/04/2021 in [facility] [place] Video Visit from 09/05/2021 in [facility] Total Score 0 0 1 0 0  PHQ-9 Total Score -- 0 -- -- --      Flowsheet Row ED from 08/27/2022 in [facility] ED from 08/17/2022 in [facility] ED from 05/17/2022 in [facility] Emergency Department at [facility]  C-SSRS RISK CATEGORY No Risk No Risk No Risk        Assessment and Plan: This patient is a 62 year old male with a history of depression anxiety and chronic pain.  His  mood and anxiety are stable.  He will continue Zoloft 150 mg daily for depression and Xanax 1 mg up to 3 times daily as needed for anxiety.  He will return to see me in 4 months  Collaboration of Care: Collaboration of Care: Primary Care Provider AEB notes are shared with PCP on the epic system  Patient/Guardian was advised Release of Information must be obtained prior to any record release in order to collaborate their care with an outside provider. Patient/Guardian was advised if they have not already done so to contact the registration department to sign all necessary forms in order for [place] to release information regarding their care.   Consent: Patient/Guardian gives verbal consent for treatment and assignment of benefits for services provided during this visit. Patient/Guardian expressed understanding and agreed to proceed.    [name], MD 04/15/2023, 9:23 AM

## 2023-04-23 ENCOUNTER — Encounter: Payer: Self-pay | Admitting: Cardiology

## 2023-04-23 ENCOUNTER — Ambulatory Visit (HOSPITAL_COMMUNITY): Payer: PPO | Attending: Cardiology | Admitting: Cardiology

## 2023-04-23 VITALS — BP 130/76 | HR 81 | Ht 74.0 in | Wt 272.0 lb

## 2023-04-23 DIAGNOSIS — I10 Essential (primary) hypertension: Secondary | ICD-10-CM

## 2023-04-23 DIAGNOSIS — I2089 Other forms of angina pectoris: Secondary | ICD-10-CM

## 2023-04-23 DIAGNOSIS — E782 Mixed hyperlipidemia: Secondary | ICD-10-CM | POA: Diagnosis not present

## 2023-04-23 DIAGNOSIS — I25119 Atherosclerotic heart disease of native coronary artery with unspecified angina pectoris: Secondary | ICD-10-CM | POA: Diagnosis not present

## 2023-04-23 MED ORDER — NITROGLYCERIN 0.4 MG SL SUBL: 0.4000 mg | SUBLINGUAL_TABLET | SUBLINGUAL | 3 refills | Status: AC | PRN

## 2023-04-23 NOTE — Progress Notes (Signed)
Cardiology Office Note  Date: 04/23/2023   ID: Jared Griffin, DOB [birth year], MRN [id number]  History of Present Illness: Jared Griffin is a 62 y.o. male last seen in January.  He is here for a follow-up visit.  Reports no increasing angina or nitroglycerin use.  Does have trouble with the high heat and humidity, but remains active as tolerated.  He is trying to lose some weight and recently started on Mounjaro.  I reviewed his medications which are otherwise stable from a cardiac perspective.  He is on Lipitor and Zetia with last LDL 46.  ECG today shows sinus rhythm with prolonged PR interval.  Physical Exam: VS:  BP 130/76   Pulse 81   Ht 6\' 2"  (1.88 m)   Wt 272 lb (123.4 kg)   SpO2 95%   BMI 34.92 kg/m , BMI Body mass index is 34.92 kg/m.  Wt Readings from Last 3 Encounters:  04/23/23 272 lb (123.4 kg)  03/13/23 272 lb 6.4 oz (123.6 kg)  01/17/23 271 lb 6.4 oz (123.1 kg)    General: Patient appears comfortable at rest. HEENT: Conjunctiva and lids normal. Neck: Supple, no elevated JVP or carotid bruits. Lungs: Clear to auscultation, nonlabored breathing at rest. Cardiac: Regular rate and rhythm, no S3 or significant systolic murmur. Extremities: No pitting edema.  ECG:  An ECG dated 05/17/2022 was personally reviewed today and demonstrated:  Sinus rhythm with prolonged PR interval.  Labwork: 05/17/2022: Magnesium 1.7 12/17/2022: ALT 37; AST 23; BUN 21; Creat 0.87; Hemoglobin 16.5; Platelets 170; Potassium 4.4; Sodium 138     Component Value Date/Time   CHOL 117 06/26/2022 1007   TRIG 238 (H) 06/26/2022 1007   HDL 33 (L) 06/26/2022 1007   CHOLHDL 3.5 06/26/2022 1007   CHOLHDL 6.4 07/12/2018 0713   VLDL UNABLE TO CALCULATE IF TRIGLYCERIDE OVER 400 mg/dL 40/98/1191 4782   LDLCALC 46 06/26/2022 1007   Other Studies Reviewed Today:  Echocardiogram 03/07/2022:  1. Left ventricular ejection fraction, by estimation, is 60 to 65%. The  left ventricle has normal  function. The left ventricle has no regional  wall motion abnormalities. There is mild concentric left ventricular  hypertrophy. Left ventricular diastolic  parameters are consistent with Grade I diastolic dysfunction (impaired  relaxation).   2. Right ventricular systolic function is normal. The right ventricular  size is normal. There is normal pulmonary artery systolic pressure. The  estimated right ventricular systolic pressure is 22.7 mmHg.   3. The mitral valve is normal in structure. No evidence of mitral valve  regurgitation. No evidence of mitral stenosis.   4. The aortic valve is tricuspid. Aortic valve regurgitation is not  visualized. No aortic stenosis is present.   5. Aortic dilatation noted. There is mild dilatation of the aortic root,  measuring 42 mm. There is mild dilatation of the ascending aorta,  measuring 41 mm.   Assessment and Plan:  1.  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  LVEF 60 to 65% by echocardiogram in June 2023.  Reports no progressive symptoms at this time.  Refill nitroglycerin for fresh bottle.  Continue aspirin, Norvasc, Farxiga, Isordil, Ranexa, Zetia, Lipitor, and as needed nitroglycerin.  2.  Mixed hyperlipidemia.  LDL 46 in October 2023.  He is doing well on combination of Lipitor and Zetia.  3.  Essential hypertension.  Blood pressure adequately controlled today.  No changes were made.  Disposition:  Follow up  6 months.  Signed, Jonelle Sidle, M.D., F.A.C.C. [facility]

## 2023-04-23 NOTE — Patient Instructions (Signed)
Medication Instructions:  Your physician recommends that you continue on your current medications as directed. Please refer to the Current Medication list given to you today.   Labwork: None today  Testing/Procedures: None today  Follow-Up: 6 months  Any Other Special Instructions Will Be Listed Below (If Applicable).  If you need a refill on your cardiac medications before your next appointment, please call your pharmacy.  

## 2023-04-26 ENCOUNTER — Inpatient Hospital Stay: Payer: PPO | Attending: Hematology

## 2023-04-26 DIAGNOSIS — Z79899 Other long term (current) drug therapy: Secondary | ICD-10-CM | POA: Diagnosis not present

## 2023-04-26 DIAGNOSIS — D500 Iron deficiency anemia secondary to blood loss (chronic): Secondary | ICD-10-CM | POA: Insufficient documentation

## 2023-04-26 DIAGNOSIS — R040 Epistaxis: Secondary | ICD-10-CM | POA: Diagnosis not present

## 2023-04-26 DIAGNOSIS — D509 Iron deficiency anemia, unspecified: Secondary | ICD-10-CM

## 2023-04-26 DIAGNOSIS — K909 Intestinal malabsorption, unspecified: Secondary | ICD-10-CM | POA: Diagnosis not present

## 2023-04-26 LAB — IRON AND TIBC
Iron: 129 ug/dL (ref 45–182)
Saturation Ratios: 29 % (ref 17.9–39.5)
TIBC: 453 ug/dL — ABNORMAL HIGH (ref 250–450)
UIBC: 324 ug/dL

## 2023-04-26 LAB — CBC WITH DIFFERENTIAL/PLATELET
Abs Immature Granulocytes: 0.03 10*3/uL (ref 0.00–0.07)
Basophils Absolute: 0 10*3/uL (ref 0.0–0.1)
Basophils Relative: 1 %
Eosinophils Absolute: 0.2 10*3/uL (ref 0.0–0.5)
Eosinophils Relative: 3 %
HCT: 46.3 % (ref 39.0–52.0)
Hemoglobin: 15.4 g/dL (ref 13.0–17.0)
Immature Granulocytes: 0 %
Lymphocytes Relative: 36 %
Lymphs Abs: 2.5 10*3/uL (ref 0.7–4.0)
MCH: 30 pg (ref 26.0–34.0)
MCHC: 33.3 g/dL (ref 30.0–36.0)
MCV: 90.1 fL (ref 80.0–100.0)
Monocytes Absolute: 0.7 10*3/uL (ref 0.1–1.0)
Monocytes Relative: 10 %
Neutro Abs: 3.5 10*3/uL (ref 1.7–7.7)
Neutrophils Relative %: 50 %
Platelets: 161 10*3/uL (ref 150–400)
RBC: 5.14 MIL/uL (ref 4.22–5.81)
RDW: 13.4 % (ref 11.5–15.5)
WBC: 7 10*3/uL (ref 4.0–10.5)
nRBC: 0 % (ref 0.0–0.2)

## 2023-04-26 LAB — FERRITIN: Ferritin: 131 ng/mL (ref 24–336)

## 2023-05-02 DIAGNOSIS — M79671 Pain in right foot: Secondary | ICD-10-CM | POA: Diagnosis not present

## 2023-05-02 DIAGNOSIS — M79672 Pain in left foot: Secondary | ICD-10-CM | POA: Diagnosis not present

## 2023-05-02 DIAGNOSIS — E1142 Type 2 diabetes mellitus with diabetic polyneuropathy: Secondary | ICD-10-CM | POA: Diagnosis not present

## 2023-05-03 ENCOUNTER — Inpatient Hospital Stay (HOSPITAL_BASED_OUTPATIENT_CLINIC_OR_DEPARTMENT_OTHER): Payer: PPO | Admitting: Oncology

## 2023-05-03 DIAGNOSIS — D509 Iron deficiency anemia, unspecified: Secondary | ICD-10-CM | POA: Diagnosis not present

## 2023-05-03 NOTE — Progress Notes (Signed)
[facility] [address], [place] [address]   CLINIC:  Medical Oncology/Hematology  PCP:  Babs Sciara, MD [address] [place] [address] [phone]  I connected with Jared Griffin on 05/03/23 at  1:30 PM EDT by telephone visit and verified that I am speaking with the correct person using two identifiers.   I discussed the limitations, risks, security and privacy concerns of performing an evaluation and management service by telemedicine and the availability of in-person appointments. I also discussed with the patient that there may be a patient responsible charge related to this service. The patient expressed understanding and agreed to proceed.   Other persons participating in the visit and their role in the encounter: NP, Patient    Patient's location: Home  Provider's location: Clinic    REASON FOR VISIT:  Follow-up for iron deficiency anemia  CURRENT THERAPY: Intermittent IV iron infusions  INTERVAL HISTORY:   Jared Griffin 62 y.o. male returns for routine follow-up of iron deficiency anemia.  He was last seen in clinic on 10/29/2022 by Rojelio Brenner, PA.  In the interim, he denies any recent hospitalizations, surgeries or changes in his health baseline.    He was last evaluated via telemedicine visit by Rojelio Brenner PA-C on 04/27/2022.  Most recent IV iron was Feraheme x 1 on 11/01/2022.  Reports developing upper back pain pretty suddenly in April 2024.  States this has been a significant impact to his quality of life.  He was referred to neurology and had an MRI which did not reveal any neurodegenerative process.  He is scheduled to start PT next week.  Pain has improved dramatically over the past 3 weeks.  He is now walking 1 to 1-1/2 miles daily.  He was evaluated by GI on 12/17/2022 for follow-up of his dysphagia, Elita Boone, IBS and diarrhea.  He was advised to adhere to a Mediterranean diet, continue colestipol 2 g and  Imodium as needed for diarrhea.  Opted to decline endoscopy at this time unless dysphagia worsens.   He met with cardiology on 04/23/2023 for history of CAD post DES to mid LAD in 2015.  Has microvascular angina with most recent cardiac catheterization in July 2023.  Stable on current meds.  Reports today, overall doing well.  Denies any new concerns.  Denies any bleeding.  Reports appetite is 100% energy level 75%.  He is currently not taking any oral iron tablets.  ASSESSMENT & PLAN:  1.  Iron deficiency anemia - Seen for initial consultation by Dr. Al Pimple on 06/16/2021 - EGD (04/04/2021): Normal esophagus, previous pill esophageal ulcer has healed; multiple gastric polyps, normal duodenum - Colonoscopy (04/04/2021): Inadequate prep, external and internal hemorrhoids, polyps, medium sized lipoma in transverse colon - No gross GI hemorrhage - denies bright red blood per rectum and melena     - Previously was having frequent nosebleeds, but these have improved - No iron pill for the past 6 months.  Also takes omeprazole daily. - Most recent IV iron with Feraheme x 1 on 11/01/22. - Symptoms of fatigue, pica, RLS improved after IV iron - Most recent labs 04/26/2023 show hemoglobin of 15.4 with MCV 90.1.  Ferritin 131 and TIBC 453.  Iron saturations 29%. - Suspect iron deficiency from frequent epistaxis, compounded by malabsorption in the setting of chronic disease and PPI use. - PLAN:  -No additional IV iron needed at this time. -Return to clinic in 6 months for follow-up with labs  and in person visit a few days later.  PLAN SUMMARY: >> Return to clinic in 6 months for follow-up with lab work and in person visit a few days later.    REVIEW OF SYSTEMS:   Review of Systems  Cardiovascular:  Positive for palpitations.  Musculoskeletal:  Positive for arthralgias and back pain.     PHYSICAL EXAM:  ECOG PERFORMANCE STATUS: 1 - Symptomatic but completely ambulatory  There were no vitals filed for  this visit. There were no vitals filed for this visit. Physical Exam Neurological:     Mental Status: He is alert and oriented to person, place, and time.    PAST MEDICAL/SURGICAL HISTORY:  Past Medical History:  Diagnosis Date   Anxiety    COPD (chronic obstructive pulmonary disease) (HCC)    Coronary atherosclerosis of native coronary artery    a. 12/09/2013: s/p PCI with 3.0 x 28 Promus DES to mLAD   Depression    Diabetic neuropathy (HCC)    Elbow injury 07/25/2017   Surgery for torn tendon   Essential hypertension    Fatty liver disease, nonalcoholic    GERD (gastroesophageal reflux disease)    Iron deficiency anemia 08/15/2021   Lateral epicondylitis of right elbow    Mixed hyperlipidemia    Obesity    Osgood-Schlatter's disease of right knee    Skin cyst 10/03/2017   Removed from back   Type 2 diabetes mellitus (HCC)    Past Surgical History:  Procedure Laterality Date   BIOPSY  01/21/2019   Procedure: BIOPSY;  Surgeon: Malissa Hippo, MD;  [facility];  Service: Endoscopy;;  gastric bx and gastric polyps   BIOPSY  11/08/2020   Procedure: BIOPSY;  Surgeon: Dolores Frame, MD;  [facility];  Service: Gastroenterology;;   CARDIAC CATHETERIZATION  2011   CARDIAC CATHETERIZATION N/A 09/20/2015   Procedure: Left Heart Cath and Coronary Angiography;  Surgeon: Kathleene Hazel, MD;  Location: [facility] INVASIVE CV LAB;  Service: Cardiovascular;  Laterality: N/A;   CHOLECYSTECTOMY  2003   COLONOSCOPY  06/19/2012   Procedure: COLONOSCOPY;  Surgeon: Malissa Hippo, MD;  [facility]: [facility];  Service: Endoscopy;  Laterality: N/A;  930   COLONOSCOPY N/A 10/17/2017   Procedure: COLONOSCOPY;  Surgeon: Malissa Hippo, MD;  [facility];  Service: Endoscopy;  Laterality: N/A;  1225   COLONOSCOPY WITH PROPOFOL N/A 11/08/2020   Procedure: COLONOSCOPY WITH PROPOFOL;  Surgeon: Dolores Frame, MD;  [facility];   Service: Gastroenterology;  Laterality: N/A;  10:45   COLONOSCOPY WITH PROPOFOL N/A 04/04/2021   Procedure: COLONOSCOPY WITH PROPOFOL;  Surgeon: Dolores Frame, MD;  [facility];  Service: Gastroenterology;  Laterality: N/A;   CORONARY PRESSURE/FFR STUDY N/A 04/10/2022   Procedure: INTRAVASCULAR PRESSURE WIRE/FFR STUDY;  Surgeon: Lyn Records, MD;  Location: [facility] INVASIVE CV LAB;  Service: Cardiovascular;  Laterality: N/A;   CYST EXCISION  07/2019   ESOPHAGOGASTRODUODENOSCOPY (EGD) WITH PROPOFOL N/A 01/21/2019   Procedure: ESOPHAGOGASTRODUODENOSCOPY (EGD) WITH PROPOFOL;  Surgeon: Malissa Hippo, MD;  [facility];  Service: Endoscopy;  Laterality: N/A;  10:15am   ESOPHAGOGASTRODUODENOSCOPY (EGD) WITH PROPOFOL N/A 02/17/2021   Procedure: ESOPHAGOGASTRODUODENOSCOPY (EGD) WITH PROPOFOL;  Surgeon: Dolores Frame, MD;  [facility];  Service: Gastroenterology;  Laterality: N/A;  10:20   ESOPHAGOGASTRODUODENOSCOPY (EGD) WITH PROPOFOL N/A 04/04/2021   Procedure: ESOPHAGOGASTRODUODENOSCOPY (EGD) WITH PROPOFOL;  Surgeon: Dolores Frame, MD;  [facility];  Service: Gastroenterology;  Laterality: N/A;  12:30   GIVENS CAPSULE STUDY N/A 09/13/2021   Procedure: GIVENS CAPSULE STUDY;  Surgeon: Dolores Frame, MD;  [facility];  Service: Gastroenterology;  Laterality: N/A;  7:30   KNEE ARTHROPLASTY Right 1999   LEFT HEART CATH AND CORONARY ANGIOGRAPHY N/A 02/26/2018   Procedure: LEFT HEART CATH AND CORONARY ANGIOGRAPHY;  Surgeon: Corky Crafts, MD;  Location: [facility] INVASIVE CV LAB;  Service: Cardiovascular;  Laterality: N/A;   LEFT HEART CATH AND CORONARY ANGIOGRAPHY N/A 04/10/2022   Procedure: LEFT HEART CATH AND CORONARY ANGIOGRAPHY;  Surgeon: Lyn Records, MD;  Location: [facility] INVASIVE CV LAB;  Service: Cardiovascular;  Laterality: N/A;   LEFT HEART CATHETERIZATION WITH CORONARY ANGIOGRAM N/A 12/09/2013    Procedure: LEFT HEART CATHETERIZATION WITH CORONARY ANGIOGRAM;  Surgeon: Corky Crafts, MD;  Location: [facility] CATH LAB;  Service: Cardiovascular;  Laterality: N/A;   Liver biopsy     POLYPECTOMY  10/17/2017   Procedure: POLYPECTOMY;  Surgeon: Malissa Hippo, MD;  [facility];  Service: Endoscopy;;  ascending colon, splenic flexure,sigmoid x2   POLYPECTOMY  11/08/2020   Procedure: POLYPECTOMY;  Surgeon: Dolores Frame, MD;  [facility];  Service: Gastroenterology;;   POLYPECTOMY  04/04/2021   Procedure: POLYPECTOMY;  Surgeon: Dolores Frame, MD;  [facility];  Service: Gastroenterology;;   TENNIS ELBOW RELEASE/NIRSCHEL PROCEDURE Right 07/25/2017   Procedure: TENNIS ELBOW RELEASE and debriedment;  Surgeon: Vickki Hearing, MD;  Location: [facility];  Service: Orthopedics;  Laterality: Right;   TONSILLECTOMY  1970's    SOCIAL HISTORY:  Social History   Socioeconomic History   Marital status: Married    Spouse name: Lance Bosch   Number of children: 1   Years of education: Not on file   Highest education level: Not on file  Occupational History   Not on file  Tobacco Use   Smoking status: Former    Current packs/day: 0.00    Average packs/day: 0.5 packs/day for 10.6 years (5.3 ttl pk-yrs)    Types: Cigarettes    Start date: 02/02/1973    Quit date: 09/17/1983    Years since quitting: 39.6   Smokeless tobacco: Former    Types: Chew    Quit date: 09/17/1983  Vaping Use   Vaping status: Never Used  Substance and Sexual Activity   Alcohol use: No    Alcohol/week: 0.0 standard drinks of alcohol   Drug use: No   Sexual activity: Yes  Other Topics Concern   Not on file  Social History Narrative   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    Married with 1 child-son   Right handed    12 th    Very little caffeine   One story 12 stairs up to door and 8 steps to front door   Social  Determinants of Health   Financial Resource Strain: Low Risk  (01/10/2023)   Overall Financial Resource Strain (CARDIA)    Difficulty of Paying Living Expenses: Not hard at all  Food Insecurity: No Food Insecurity (08/08/2021)   Hunger Vital Sign    Worried About Running Out of Food in the Last Year: Never true    Ran Out of Food in the Last Year: Never true  Transportation Needs: Unknown (01/10/2023)   PRAPARE - Administrator, Civil Service (Medical): Patient declined  Lack of Transportation (Non-Medical): Not on file  Physical Activity: Insufficiently Active (01/10/2023)   Exercise Vital Sign    Days of Exercise per Week: 3 days    Minutes of Exercise per Session: 40 min  Stress: Stress Concern Present (01/10/2023)   [employer] of Occupational Health - Occupational Stress Questionnaire    Feeling of Stress : To some extent  Social Connections: Unknown (01/10/2023)   Social Connection and Isolation Panel [NHANES]    Frequency of Communication with Friends and Family: Not on file    Frequency of Social Gatherings with Friends and Family: Not on file    Attends Religious Services: Not on file    Active Member of Clubs or Organizations: Patient declined    Attends Banker Meetings: Not on file    Marital Status: Patient declined  Intimate Partner Violence: Unknown (12/21/2021)   Received from [facility]   HITS    Physically Hurt: Not on file    Insult or Talk Down To: Not on file    Threaten Physical Harm: Not on file    Scream or Curse: Not on file    FAMILY HISTORY:  Family History  Problem Relation Age of Onset   Osteoporosis Mother    Cancer Maternal Aunt    CVA Maternal Grandmother    CVA Maternal Grandfather    Healthy Son     CURRENT MEDICATIONS:  Outpatient Encounter Medications as of 05/03/2023  Medication Sig   acetaminophen (TYLENOL) 500 MG tablet Take 1,500 mg by mouth daily as needed for moderate pain.   albuterol (VENTOLIN  HFA) 108 (90 Base) MCG/ACT inhaler INHALE 2 PUFFS EVERY 6 HOURS (Patient taking differently: as needed for wheezing or shortness of breath. INHALE 2 PUFFS EVERY 6 HOURS)   ALPRAZolam (XANAX) 1 MG tablet Take 1 tablet (1 mg total) by mouth 3 (three) times daily as needed for anxiety.   aspirin 81 MG EC tablet Take 1 tablet (81 mg total) by mouth daily.   atorvastatin (LIPITOR) 40 MG tablet TAKE ONE TABLET (40MG  TOTAL) BY MOUTH DAILY   B-D UF III MINI PEN NEEDLES 31G X 5 MM MISC USE UP TO 4 TIMES DAILY WITH INSULIN AS DIRECTED.   colestipol (COLESTID) 1 g tablet Take 2 tablets (2 g total) by mouth daily. Take 4 hours apart from other medication (Patient taking differently: Take 1 g by mouth daily as needed (diarrhea). Take 4 hours apart from other medication)   Continuous Blood Gluc Sensor (FREESTYLE LIBRE 14 DAY SENSOR) MISC Inject 1 each into the skin every 14 (fourteen) days. Use as directed.   ezetimibe (ZETIA) 10 MG tablet TAKE ONE TABLET (10MG  TOTAL) BY MOUTH DAILY   FARXIGA 10 MG TABS tablet Take 10 mg by mouth daily.   fluticasone (FLONASE) 50 MCG/ACT nasal spray Place 2 sprays into both nostrils daily. (Patient taking differently: Place 2 sprays into both nostrils daily as needed for allergies.)   fluticasone-salmeterol (ADVAIR) 250-50 MCG/ACT AEPB Inhale 1 puff into the lungs in the morning and at bedtime.   gabapentin (NEURONTIN) 300 MG capsule TAKE 2 CAPSULES BY MOUTH EACH MORNING AND 2 CAPSULE EACH EVENING.   HUMALOG KWIKPEN 200 UNIT/ML KwikPen Inject 30-55 Units into the skin 3 (three) times daily before meals.   isosorbide dinitrate (ISORDIL) 30 MG tablet Take 30 mg by mouth daily at 6 (six) AM.   Lancets MISC 1 each by Does not apply route 4 (four) times daily.   loratadine (CLARITIN) 10  MG tablet Take 10 mg by mouth daily as needed for allergies.   losartan (COZAAR) 25 MG tablet TAKE ONE (1) TABLET BY MOUTH EVERY DAY   metFORMIN (GLUCOPHAGE-XR) 500 MG 24 hr tablet Take 1,000 mg by  mouth 2 (two) times daily.   MOUNJARO 2.5 MG/0.5ML Pen Inject into the skin once a week.   nitroGLYCERIN (NITROSTAT) 0.4 MG SL tablet Place 1 tablet (0.4 mg total) under the tongue every 5 (five) minutes as needed.   omeprazole (PRILOSEC) 40 MG capsule TAKE ONE CAPSULE (40MG  TOTAL) BY MOUTH DAILY   ondansetron (ZOFRAN-ODT) 8 MG disintegrating tablet TAKE ONE TABLET (8MG  TOTAL) BY MOUTH EVERY 8 HOURS AS NEEDED FRO NAUSEA OR VOMITING   propranolol (INDERAL) 20 MG tablet TAKE ONE TABLET (20MG  TOTAL) BY MOUTH TWO TIMES DAILY   ranolazine (RANEXA) 1000 MG SR tablet TAKE ONE TABLET (1000MG  TOTAL) BY MOUTH TWO TIMES DAILY   sertraline (ZOLOFT) 100 MG tablet Take 1.5 tablets (150 mg total) by mouth daily.   TOUJEO MAX SOLOSTAR 300 UNIT/ML Solostar Pen INJECT 130 UNITS INTO THE SKIN AT BEDTIME. (Patient taking differently: Inject 50-55 Units into the skin at bedtime.)   traMADol (ULTRAM) 50 MG tablet Take 1 tablet (50 mg total) by mouth every 8 (eight) hours as needed for moderate pain or severe pain.   amLODipine (NORVASC) 5 MG tablet Take 1 tablet (5 mg total) by mouth daily.   [DISCONTINUED] dapagliflozin propanediol (FARXIGA) 5 MG TABS tablet Take 10 mg by mouth daily.   No facility-administered encounter medications on file as of 05/03/2023.    ALLERGIES:  Allergies  Allergen Reactions   Bydureon [Exenatide] Diarrhea    Severe diarrhea and nausea   Ozempic (0.25 Or 0.5 Mg-Dose) [Semaglutide(0.25 Or 0.5mg -Dos)] Diarrhea    Severe nausea and diarrhea   Doxycycline     Severe esophagitis    LABORATORY DATA:  I have reviewed the labs as listed.  CBC    Component Value Date/Time   WBC 7.0 04/26/2023 1000   RBC 5.14 04/26/2023 1000   HGB 15.4 04/26/2023 1000   HGB 14.0 04/05/2021 1016   HCT 46.3 04/26/2023 1000   HCT 43.1 06/20/2021 0839   PLT 161 04/26/2023 1000   PLT 235 04/05/2021 1016   MCV 90.1 04/26/2023 1000   MCV 81 04/05/2021 1016   MCH 30.0 04/26/2023 1000   MCHC 33.3  04/26/2023 1000   RDW 13.4 04/26/2023 1000   RDW 13.6 04/05/2021 1016   LYMPHSABS 2.5 04/26/2023 1000   LYMPHSABS 1.7 04/05/2021 1016   MONOABS 0.7 04/26/2023 1000   EOSABS 0.2 04/26/2023 1000   EOSABS 0.3 04/05/2021 1016   BASOSABS 0.0 04/26/2023 1000   BASOSABS 0.1 04/05/2021 1016      Latest Ref Rng & Units 12/17/2022   10:28 AM 06/26/2022   10:07 AM 05/17/2022    1:09 AM  CMP  Glucose 65 - 99 mg/dL 161  096  96   BUN 7 - 25 mg/dL 21  15  22    Creatinine 0.70 - 1.35 mg/dL 0.45  4.09  8.11   Sodium 135 - 146 mmol/L 138  139  141   Potassium 3.5 - 5.3 mmol/L 4.4  4.5  3.7   Chloride 98 - 110 mmol/L 100  97  106   CO2 20 - 32 mmol/L 24  23  22    Calcium 8.6 - 10.3 mg/dL 91.4  9.4  9.1   Total Protein 6.1 - 8.1 g/dL 7.7  6.7  6.7   Total Bilirubin 0.2 - 1.2 mg/dL 0.7  0.9  1.3   Alkaline Phos 44 - 121 IU/L  46  46   AST 10 - 35 U/L 23  30  40   ALT 9 - 46 U/L 37  43  55     DIAGNOSTIC IMAGING:  I have independently reviewed the relevant imaging and discussed with the patient.   WRAP UP:  All questions were answered. The patient knows to call the clinic with any problems, questions or concerns.  Medical decision making: Low  I provided 14 minutes of non face-to-face telephone visit time during this encounter, and > 50% was spent counseling as documented under my assessment & plan.  Mauro Kaufmann, NP  05/03/23 1:19 PM

## 2023-05-09 ENCOUNTER — Encounter (HOSPITAL_COMMUNITY): Payer: Self-pay | Admitting: Physical Therapy

## 2023-05-09 ENCOUNTER — Ambulatory Visit (HOSPITAL_COMMUNITY): Payer: PPO | Admitting: Physical Therapy

## 2023-05-09 DIAGNOSIS — M545 Low back pain, unspecified: Secondary | ICD-10-CM

## 2023-05-09 DIAGNOSIS — M546 Pain in thoracic spine: Secondary | ICD-10-CM

## 2023-05-09 DIAGNOSIS — R29898 Other symptoms and signs involving the musculoskeletal system: Secondary | ICD-10-CM

## 2023-05-09 NOTE — Therapy (Signed)
  OUTPATIENT PHYSICAL THERAPY THORACOLUMBAR EVALUATION   Patient Name: Jared Griffin MRN: [id number] DOB:[birth year], 62 y.o., male Today's Date: 05/09/2023  END OF SESSION:  PT End of Session - 05/09/23 0945     Visit Number 1    Authorization Type [employer]    PT Start Time 0945    PT Stop Time 1005    PT Time Calculation (min) 20 min    Activity Tolerance Patient tolerated treatment well    Behavior During Therapy [facility] for tasks assessed/performed            Patient referred for back pain which has since mostly resolved with completion of prior HEP. Patient has been exercising regularly, performing a walking program, and remaining active with yard work. Discussed PT referral with patient and he is agreeable to continue with HEP and continued exercise. Educated on returning to PT if needed. Patient does not require PT services at this time.   10:20 AM, 05/09/23 Wyman Songster PT, DPT Physical Therapist at [facility]

## 2023-05-31 ENCOUNTER — Ambulatory Visit (INDEPENDENT_AMBULATORY_CARE_PROVIDER_SITE_OTHER): Payer: PPO

## 2023-05-31 VITALS — Ht 74.0 in | Wt 264.0 lb

## 2023-05-31 DIAGNOSIS — Z0000 Encounter for general adult medical examination without abnormal findings: Secondary | ICD-10-CM | POA: Diagnosis not present

## 2023-05-31 NOTE — Patient Instructions (Signed)
[name] , Thank you for taking time to come for your Medicare Wellness Visit. I appreciate your ongoing commitment to your health goals. Please review the following plan we discussed and let me know if I can assist you in the future.   Referrals/Orders/Follow-Ups/Clinician Recommendations:  I will request your foot exam office visit notes from Dr. Nolen Mu so that we can update that section of your chart.   This is a list of the screening recommended for you and due dates:  Health Maintenance  Topic Date Due   Zoster (Shingles) Vaccine (1 of 2) Never done   Complete foot exam   08/02/2018   Hemoglobin A1C  08/10/2022   Yearly kidney health urinalysis for diabetes  02/08/2023   Flu Shot  06/18/2023*   Yearly kidney function blood test for diabetes  12/17/2023   DTaP/Tdap/Td vaccine (2 - Tdap) 01/29/2024   Eye exam for diabetics  02/07/2024   Colon Cancer Screening  04/04/2024   Medicare Annual Wellness Visit  05/30/2024   Hepatitis C Screening  Completed   HIV Screening  Completed   HPV Vaccine  Aged Out   COVID-19 Vaccine  Discontinued  *Topic was postponed. The date shown is not the original due date.    Advanced directives: (ACP Link)Information on Advanced Care Planning can be found at [facility] Directives Advance Health Care Directives ([email or web address]   Next Medicare Annual Wellness Visit scheduled for next year: Your next Annual Wellness Visit will be on June 05, 2024 at 10:40am. This will be a virtual visit.    Preventive Care 44-77 Years Old, Male Preventive care refers to lifestyle choices and visits with your health care provider that can promote health and wellness. Preventive care visits are also called wellness exams. What can I expect for my preventive care visit? Counseling During your preventive care visit, your health care provider may ask about your: Medical history, including: Past medical problems. Family  medical history. Current health, including: Emotional well-being. Home life and relationship well-being. Sexual activity. Lifestyle, including: Alcohol, nicotine or tobacco, and drug use. Access to firearms. Diet, exercise, and sleep habits. Safety issues such as seatbelt and bike helmet use. Sunscreen use. Work and work Astronomer. Physical exam Your health care provider will check your: Height and weight. These may be used to calculate your BMI (body mass index). BMI is a measurement that tells if you are at a healthy weight. Waist circumference. This measures the distance around your waistline. This measurement also tells if you are at a healthy weight and may help predict your risk of certain diseases, such as type 2 diabetes and high blood pressure. Heart rate and blood pressure. Body temperature. Skin for abnormal spots. What immunizations do I need?  Vaccines are usually given at various ages, according to a schedule. Your health care provider will recommend vaccines for you based on your age, medical history, and lifestyle or other factors, such as travel or where you work. What tests do I need? Screening Your health care provider may recommend screening tests for certain conditions. This may include: Lipid and cholesterol levels. Diabetes screening. This is done by checking your blood sugar (glucose) after you have not eaten for a while (fasting). Hepatitis B test. Hepatitis C test. HIV (human immunodeficiency virus) test. STI (sexually transmitted infection) testing, if you are at risk. Lung cancer screening. Prostate cancer screening. Colorectal cancer screening. Talk with your health care provider about your test results, treatment options,  and if necessary, the need for more tests. Follow these instructions at home: Eating and drinking  Eat a diet that includes fresh fruits and vegetables, whole grains, lean protein, and low-fat dairy products. Take vitamin and  mineral supplements as recommended by your health care provider. Do not drink alcohol if your health care provider tells you not to drink. If you drink alcohol: Limit how much you have to 0-2 drinks a day. Know how much alcohol is in your drink. In the [place] one drink equals one 12 oz bottle of beer (355 mL), one 5 oz glass of wine (148 mL), or one 1 oz glass of hard liquor (44 mL). Lifestyle Brush your teeth every morning and night with fluoride toothpaste. Floss one time each day. Exercise for at least 30 minutes 5 or more days each week. Do not use any products that contain nicotine or tobacco. These products include cigarettes, chewing tobacco, and vaping devices, such as e-cigarettes. If you need help quitting, ask your health care provider. Do not use drugs. If you are sexually active, practice safe sex. Use a condom or other form of protection to prevent STIs. Take aspirin only as told by your health care provider. Make sure that you understand how much to take and what form to take. Work with your health care provider to find out whether it is safe and beneficial for you to take aspirin daily. Find healthy ways to manage stress, such as: Meditation, yoga, or listening to music. Journaling. Talking to a trusted person. Spending time with friends and family. Minimize exposure to UV radiation to reduce your risk of skin cancer. Safety Always wear your seat belt while driving or riding in a vehicle. Do not drive: If you have been drinking alcohol. Do not ride with someone who has been drinking. When you are tired or distracted. While texting. If you have been using any mind-altering substances or drugs. Wear a helmet and other protective equipment during sports activities. If you have firearms in your house, make sure you follow all gun safety procedures. What's next? Go to your health care provider once a year for an annual wellness visit. Ask your health care provider how often  you should have your eyes and teeth checked. Stay up to date on all vaccines. This information is not intended to replace advice given to you by your health care provider. Make sure you discuss any questions you have with your health care provider. Document Revised: 03/01/2021 Document Reviewed: 03/01/2021 Elsevier Patient Education  2024 [employer].

## 2023-05-31 NOTE — Progress Notes (Signed)
Because this visit was a virtual/telehealth visit,  certain criteria was not obtained, such a blood pressure, CBG if applicable, and timed get up and go. Any medications not marked as "taking" were not mentioned during the medication reconciliation part of the visit. Any vitals not documented were not able to be obtained due to this being a telehealth visit or patient was unable to self-report a recent blood pressure reading due to a lack of equipment at home via telehealth. Vitals that have been documented are verbally provided by the patient.   Subjective:   Jared Griffin is a 62 y.o. male who presents for Medicare Annual/Subsequent preventive examination.  Visit Complete: Virtual  I connected with  Jared Griffin on 05/31/23 by a audio enabled telemedicine application and verified that I am speaking with the correct person using two identifiers.  Patient Location: Home  Provider Location: Home Office  I discussed the limitations of evaluation and management by telemedicine. The patient expressed understanding and agreed to proceed.  Patient Medicare AWV questionnaire was completed by the patient on n/a; I have confirmed that all information answered by patient is correct and no changes since this date.  Cardiac Risk Factors include: advanced age (>61men, >20 women);diabetes mellitus;dyslipidemia;hypertension;sedentary lifestyle;obesity (BMI >30kg/m2);male gender     Objective:    Today's Vitals   05/31/23 1007  Weight: 264 lb (119.7 kg)  Height: 6\' 2"  (1.88 m)   Body mass index is 33.9 kg/m.     05/31/2023   10:11 AM 05/03/2023    1:01 PM 03/13/2023    9:49 AM 11/01/2022    1:32 PM 10/29/2022   10:50 AM 05/17/2022   12:52 AM 04/27/2022   11:57 AM  Advanced Directives  Does Patient Have a Medical Advance Directive? No No No No No Yes Yes  Type of Furniture conservator/restorer;Living will  Healthcare Power of [place];Living will  Healthcare Power of  [place];Living will Healthcare Power of [place];Living will  Does patient want to make changes to medical advance directive?  No - Patient declined  No - Patient declined     Copy of Healthcare Power of Attorney in Chart?  No - copy requested  No - copy requested  No - copy requested No - copy requested  Would patient like information on creating a medical advance directive? No - Patient declined No - Patient declined  No - Patient declined No - Patient declined      Current Medications (verified) Outpatient Encounter Medications as of 05/31/2023  Medication Sig   acetaminophen (TYLENOL) 500 MG tablet Take 1,500 mg by mouth daily as needed for moderate pain.   albuterol (VENTOLIN HFA) 108 (90 Base) MCG/ACT inhaler INHALE 2 PUFFS EVERY 6 HOURS (Patient taking differently: as needed for wheezing or shortness of breath. INHALE 2 PUFFS EVERY 6 HOURS)   ALPRAZolam (XANAX) 1 MG tablet Take 1 tablet (1 mg total) by mouth 3 (three) times daily as needed for anxiety.   amLODipine (NORVASC) 5 MG tablet Take 1 tablet (5 mg total) by mouth daily.   aspirin 81 MG EC tablet Take 1 tablet (81 mg total) by mouth daily.   atorvastatin (LIPITOR) 40 MG tablet TAKE ONE TABLET (40MG  TOTAL) BY MOUTH DAILY   B-D UF III MINI PEN NEEDLES 31G X 5 MM MISC USE UP TO 4 TIMES DAILY WITH INSULIN AS DIRECTED.   colestipol (COLESTID) 1 g tablet Take 2 tablets (2 g total) by mouth daily. Take 4 hours  apart from other medication (Patient taking differently: Take 1 g by mouth daily as needed (diarrhea). Take 4 hours apart from other medication)   Continuous Blood Gluc Sensor (FREESTYLE LIBRE 14 DAY SENSOR) MISC Inject 1 each into the skin every 14 (fourteen) days. Use as directed.   ezetimibe (ZETIA) 10 MG tablet TAKE ONE TABLET (10MG  TOTAL) BY MOUTH DAILY   FARXIGA 10 MG TABS tablet Take 10 mg by mouth daily.   fluticasone (FLONASE) 50 MCG/ACT nasal spray Place 2 sprays into both nostrils daily. (Patient taking differently: Place  2 sprays into both nostrils daily as needed for allergies.)   fluticasone-salmeterol (ADVAIR) 250-50 MCG/ACT AEPB Inhale 1 puff into the lungs in the morning and at bedtime.   gabapentin (NEURONTIN) 300 MG capsule TAKE 2 CAPSULES BY MOUTH EACH MORNING AND 2 CAPSULE EACH EVENING.   HUMALOG KWIKPEN 200 UNIT/ML KwikPen Inject 30-55 Units into the skin 3 (three) times daily before meals.   isosorbide dinitrate (ISORDIL) 30 MG tablet Take 30 mg by mouth daily at 6 (six) AM.   Lancets MISC 1 each by Does not apply route 4 (four) times daily.   loratadine (CLARITIN) 10 MG tablet Take 10 mg by mouth daily as needed for allergies.   losartan (COZAAR) 25 MG tablet TAKE ONE (1) TABLET BY MOUTH EVERY DAY   metFORMIN (GLUCOPHAGE-XR) 500 MG 24 hr tablet Take 1,000 mg by mouth 2 (two) times daily.   MOUNJARO 2.5 MG/0.5ML Pen Inject into the skin once a week.   nitroGLYCERIN (NITROSTAT) 0.4 MG SL tablet Place 1 tablet (0.4 mg total) under the tongue every 5 (five) minutes as needed.   omeprazole (PRILOSEC) 40 MG capsule TAKE ONE CAPSULE (40MG  TOTAL) BY MOUTH DAILY   ondansetron (ZOFRAN-ODT) 8 MG disintegrating tablet TAKE ONE TABLET (8MG  TOTAL) BY MOUTH EVERY 8 HOURS AS NEEDED FRO NAUSEA OR VOMITING   propranolol (INDERAL) 20 MG tablet TAKE ONE TABLET (20MG  TOTAL) BY MOUTH TWO TIMES DAILY   ranolazine (RANEXA) 1000 MG SR tablet TAKE ONE TABLET (1000MG  TOTAL) BY MOUTH TWO TIMES DAILY   sertraline (ZOLOFT) 100 MG tablet Take 1.5 tablets (150 mg total) by mouth daily.   TOUJEO MAX SOLOSTAR 300 UNIT/ML Solostar Pen INJECT 130 UNITS INTO THE SKIN AT BEDTIME. (Patient taking differently: Inject 50-55 Units into the skin at bedtime.)   traMADol (ULTRAM) 50 MG tablet Take 1 tablet (50 mg total) by mouth every 8 (eight) hours as needed for moderate pain or severe pain.   No facility-administered encounter medications on file as of 05/31/2023.    Allergies (verified) Bydureon [exenatide], Ozempic (0.25 or 0.5 mg-dose)  [semaglutide(0.25 or 0.5mg -dos)], and Doxycycline   History: Past Medical History:  Diagnosis Date   Anxiety    COPD (chronic obstructive pulmonary disease) (HCC)    Coronary atherosclerosis of native coronary artery    a. 12/09/2013: s/p PCI with 3.0 x 28 Promus DES to mLAD   Depression    Diabetic neuropathy (HCC)    Elbow injury 07/25/2017   Surgery for torn tendon   Essential hypertension    Fatty liver disease, nonalcoholic    GERD (gastroesophageal reflux disease)    Iron deficiency anemia 08/15/2021   Lateral epicondylitis of right elbow    Mixed hyperlipidemia    Obesity    Osgood-Schlatter's disease of right knee    Skin cyst 10/03/2017   Removed from back   Type 2 diabetes mellitus (HCC)    Past Surgical History:  Procedure Laterality Date  BIOPSY  01/21/2019   Procedure: BIOPSY;  Surgeon: Malissa Hippo, MD;  [facility];  Service: Endoscopy;;  gastric bx and gastric polyps   BIOPSY  11/08/2020   Procedure: BIOPSY;  Surgeon: Dolores Frame, MD;  [facility];  Service: Gastroenterology;;   CARDIAC CATHETERIZATION  2011   CARDIAC CATHETERIZATION N/A 09/20/2015   Procedure: Left Heart Cath and Coronary Angiography;  Surgeon: Kathleene Hazel, MD;  Location: [facility] INVASIVE CV LAB;  Service: Cardiovascular;  Laterality: N/A;   CHOLECYSTECTOMY  2003   COLONOSCOPY  06/19/2012   Procedure: COLONOSCOPY;  Surgeon: Malissa Hippo, MD;  [facility];  Service: Endoscopy;  Laterality: N/A;  930   COLONOSCOPY N/A 10/17/2017   Procedure: COLONOSCOPY;  Surgeon: Malissa Hippo, MD;  [facility];  Service: Endoscopy;  Laterality: N/A;  1225   COLONOSCOPY WITH PROPOFOL N/A 11/08/2020   Procedure: COLONOSCOPY WITH PROPOFOL;  Surgeon: Dolores Frame, MD;  [facility];  Service: Gastroenterology;  Laterality: N/A;  10:45   COLONOSCOPY WITH PROPOFOL N/A 04/04/2021   Procedure: COLONOSCOPY WITH PROPOFOL;   Surgeon: Dolores Frame, MD;  [facility];  Service: Gastroenterology;  Laterality: N/A;   CORONARY PRESSURE/FFR STUDY N/A 04/10/2022   Procedure: INTRAVASCULAR PRESSURE WIRE/FFR STUDY;  Surgeon: Lyn Records, MD;  Location: [facility] INVASIVE CV LAB;  Service: Cardiovascular;  Laterality: N/A;   CYST EXCISION  07/2019   ESOPHAGOGASTRODUODENOSCOPY (EGD) WITH PROPOFOL N/A 01/21/2019   Procedure: ESOPHAGOGASTRODUODENOSCOPY (EGD) WITH PROPOFOL;  Surgeon: Malissa Hippo, MD;  [facility];  Service: Endoscopy;  Laterality: N/A;  10:15am   ESOPHAGOGASTRODUODENOSCOPY (EGD) WITH PROPOFOL N/A 02/17/2021   Procedure: ESOPHAGOGASTRODUODENOSCOPY (EGD) WITH PROPOFOL;  Surgeon: Dolores Frame, MD;  [facility];  Service: Gastroenterology;  Laterality: N/A;  10:20   ESOPHAGOGASTRODUODENOSCOPY (EGD) WITH PROPOFOL N/A 04/04/2021   Procedure: ESOPHAGOGASTRODUODENOSCOPY (EGD) WITH PROPOFOL;  Surgeon: Dolores Frame, MD;  [facility];  Service: Gastroenterology;  Laterality: N/A;  12:30   GIVENS CAPSULE STUDY N/A 09/13/2021   Procedure: GIVENS CAPSULE STUDY;  Surgeon: Dolores Frame, MD;  [facility];  Service: Gastroenterology;  Laterality: N/A;  7:30   KNEE ARTHROPLASTY Right 1999   LEFT HEART CATH AND CORONARY ANGIOGRAPHY N/A 02/26/2018   Procedure: LEFT HEART CATH AND CORONARY ANGIOGRAPHY;  Surgeon: Corky Crafts, MD;  Location: [facility] INVASIVE CV LAB;  Service: Cardiovascular;  Laterality: N/A;   LEFT HEART CATH AND CORONARY ANGIOGRAPHY N/A 04/10/2022   Procedure: LEFT HEART CATH AND CORONARY ANGIOGRAPHY;  Surgeon: Lyn Records, MD;  Location: [facility]VAS[facility] CV LAB;  Service: Cardiovascular;  Laterality: N/A;   LEFT HEART CATHETERIZATION WITH CORONARY ANGIOGRAM N/A 12/09/2013   Procedure: LEFT HEART CATHETERIZATION WITH CORONARY ANGIOGRAM;  Surgeon: Corky Crafts, MD;  Location: [facility] CATH LAB;  Service:  Cardiovascular;  Laterality: N/A;   Liver biopsy     POLYPECTOMY  10/17/2017   Procedure: POLYPECTOMY;  Surgeon: Malissa Hippo, MD;  [facility];  Service: Endoscopy;;  ascending colon, splenic flexure,sigmoid x2   POLYPECTOMY  11/08/2020   Procedure: POLYPECTOMY;  Surgeon: Dolores Frame, MD;  [facility];  Service: Gastroenterology;;   POLYPECTOMY  04/04/2021   Procedure: POLYPECTOMY;  Surgeon: Dolores Frame, MD;  [facility];  Service: Gastroenterology;;   TENNIS ELBOW RELEASE/NIRSCHEL PROCEDURE Right 07/25/2017   Procedure: TENNIS ELBOW RELEASE and debriedment;  Surgeon: Vickki Hearing, MD;  Location: [facility];  Service: Orthopedics;  Laterality: Right;   TONSILLECTOMY  1970's   Family History  Problem Relation Age of Onset   Osteoporosis Mother    Cancer Maternal Aunt    CVA Maternal Grandmother    CVA Maternal Grandfather    Healthy Son    Social History   Socioeconomic History   Marital status: Married    Spouse name: Jared Griffin   Number of children: 1   Years of education: Not on file   Highest education level: Not on file  Occupational History   Not on file  Tobacco Use   Smoking status: Former    Current packs/day: 0.00    Average packs/day: 0.5 packs/day for 10.6 years (5.3 ttl pk-yrs)    Types: Cigarettes    Start date: 02/02/1973    Quit date: 09/17/1983    Years since quitting: 39.7   Smokeless tobacco: Former    Types: Chew    Quit date: 09/17/1983  Vaping Use   Vaping status: Never Used  Substance and Sexual Activity   Alcohol use: No    Alcohol/week: 0.0 standard drinks of alcohol   Drug use: No   Sexual activity: Yes  Other Topics Concern   Not on file  Social History Narrative   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    Married with 1 child-son   Right handed    12 th    Very little caffeine   One story 12 stairs up to door and 8 steps to  front door   Social Determinants of Health   Financial Resource Strain: Low Risk  (05/31/2023)   Overall Financial Resource Strain (CARDIA)    Difficulty of Paying Living Expenses: Not hard at all  Food Insecurity: No Food Insecurity (05/31/2023)   Hunger Vital Sign    Worried About Running Out of Food in the Last Year: Never true    Ran Out of Food in the Last Year: Never true  Transportation Needs: Patient Declined (05/31/2023)   PRAPARE - Transportation    Lack of Transportation (Medical): Patient declined    Lack of Transportation (Non-Medical): Patient declined  Physical Activity: Sufficiently Active (05/31/2023)   Exercise Vital Sign    Days of Exercise per Week: 4 days    Minutes of Exercise per Session: 50 min  Stress: No Stress Concern Present (05/31/2023)   [employer] of Occupational Health - Occupational Stress Questionnaire    Feeling of Stress : Not at all  Social Connections: Moderately Isolated (05/31/2023)   Social Connection and Isolation Panel [NHANES]    Frequency of Communication with Friends and Family: More than three times a week    Frequency of Social Gatherings with Friends and Family: More than three times a week    Attends Religious Services: Never    Database administrator or Organizations: No    Attends Engineer, structural: Never    Marital Status: Married    Tobacco Counseling Counseling given: Yes   Clinical Intake:  Pre-visit preparation completed: Yes  Pain : No/denies pain     BMI - recorded: 33.9 Nutritional Status: BMI > 30  Obese Nutritional Risks: None Diabetes: Yes CBG done?: No (telehealth visit. unable to obtain cbg) Did pt. bring in CBG monitor from home?: No  How often do you need to have someone help you when you read instructions, pamphlets, or other written materials  from your doctor or pharmacy?: 1 - Never  Interpreter Needed?: No  Information entered by :: Abby Marin Milley, CMA   Activities of Daily  Living    05/31/2023   10:09 AM  In your present state of health, do you have any difficulty performing the following activities:  Hearing? 0  Vision? 0  Difficulty concentrating or making decisions? 0  Walking or climbing stairs? 0  Dressing or bathing? 0  Doing errands, shopping? 0  Preparing Food and eating ? N  Using the Toilet? N  In the past six months, have you accidently leaked urine? N  Do you have problems with loss of bowel control? N  Managing your Medications? N  Managing your Finances? N  Housekeeping or managing your Housekeeping? N    Patient Care Team: Babs Sciara, MD as PCP - General (Family Medicine) Jonelle Sidle, MD as PCP - Cardiology (Cardiology) Emilio Math, DPM as Consulting Physician (Podiatry) Myrlene Broker, MD as Consulting Physician [facility]) Gavin Pound, [facility] (Inactive) (Pharmacist)  Indicate any recent Medical Services you may have received from other than [facility] providers in the past year (date may be approximate).     Assessment:   This is a routine wellness examination for [place].  Hearing/Vision screen Hearing Screening - Comments:: Patient denies any hearing difficulties.   Vision Screening - Comments:: Wears rx glasses - up to date with routine eye exams Dr. Charise Killian [facility]   Goals Addressed             This Visit's Progress    Patient Stated       Lose weight       Depression Screen    05/31/2023   10:13 AM 09/26/2022    1:13 PM 05/22/2022   11:28 AM 04/05/2022    9:12 AM 12/04/2021   10:29 AM 09/05/2021    1:43 PM 08/08/2021    1:41 PM  PHQ 2/9 Scores  PHQ - 2 Score 0 0 0    0  PHQ- 9 Score 4  0    0     Information is confidential and restricted. Go to Review Flowsheets to unlock data.    Fall Risk    05/31/2023   10:11 AM 03/13/2023    9:55 AM 03/13/2023    9:50 AM 09/26/2022    1:12 PM 06/26/2022    8:56 AM  Fall Risk   Falls in the past year? 0 1 0 1 1  Number  falls in past yr: 0 1 0 0 0  Comment     pt passed out due to dehydration  Injury with Fall? 0 0 0 0 1  Risk for fall due to : No Fall Risks;Orthopedic patient   Impaired balance/gait Other (Comment)  Risk for fall due to: Comment     pt was dehydrated  Follow up Falls prevention discussed;Education provided Falls evaluation completed Falls evaluation completed Falls evaluation completed;Education provided Falls evaluation completed;Education provided;Falls prevention discussed    MEDICARE RISK AT HOME: Medicare Risk at Home Any stairs in or around the home?: No If so, are there any without handrails?: No Home free of loose throw rugs in walkways, pet beds, electrical cords, etc?: Yes Adequate lighting in your home to reduce risk of falls?: Yes Life alert?: No Use of a cane, walker or w/c?: No Grab bars in the bathroom?: No Shower chair or bench in shower?: No Elevated toilet seat or a handicapped toilet?: No  TIMED UP AND GO:  Was the test performed?  No    Cognitive Function:        05/31/2023   10:13 AM 08/08/2021    1:46 PM  6CIT Screen  What Year? 0 points 0 points  What month? 0 points 0 points  What time? 0 points 0 points  Count back from 20 0 points 0 points  Months in reverse 0 points 0 points  Repeat phrase 0 points 0 points  Total Score 0 points 0 points    Immunizations Immunization History  Administered Date(s) Administered   Influenza,inj,Quad PF,6+ Mos 07/11/2016, 08/02/2017, 08/05/2018, 08/10/2019, 08/08/2020, 06/21/2021, 06/26/2022   Influenza-Unspecified 06/18/2013, 06/17/2014, 07/06/2015   PNEUMOCOCCAL CONJUGATE-20 06/26/2022   Td 01/28/2014    TDAP status: Up to date  Flu Vaccine status: Due, Education has been provided regarding the importance of this vaccine. Advised may receive this vaccine at local pharmacy or Health Dept. Aware to provide a copy of the vaccination record if obtained from local pharmacy or Health Dept. Verbalized acceptance  and understanding.  Pneumococcal vaccine status: Not age appropriate for this patient.   Covid-19 vaccine status: Information provided on how to obtain vaccines.   Qualifies for Shingles Vaccine? Yes   Zostavax completed No   Shingrix Completed?: No.    Education has been provided regarding the importance of this vaccine. Patient has been advised to call insurance company to determine out of pocket expense if they have not yet received this vaccine. Advised may also receive vaccine at local pharmacy or Health Dept. Verbalized acceptance and understanding.  Screening Tests Health Maintenance  Topic Date Due   Zoster Vaccines- Shingrix (1 of 2) Never done   FOOT EXAM  08/02/2018   HEMOGLOBIN A1C  08/10/2022   Diabetic kidney evaluation - Urine ACR  02/08/2023   Medicare Annual Wellness (AWV)  06/27/2023   INFLUENZA VACCINE  06/18/2023 (Originally 04/18/2023)   Diabetic kidney evaluation - eGFR measurement  12/17/2023   DTaP/Tdap/Td (2 - Tdap) 01/29/2024   OPHTHALMOLOGY EXAM  02/07/2024   Colonoscopy  04/04/2024   Hepatitis C Screening  Completed   HIV Screening  Completed   HPV VACCINES  Aged Out   COVID-19 Vaccine  Discontinued    Health Maintenance  Health Maintenance Due  Topic Date Due   Zoster Vaccines- Shingrix (1 of 2) Never done   FOOT EXAM  08/02/2018   HEMOGLOBIN A1C  08/10/2022   Diabetic kidney evaluation - Urine ACR  02/08/2023   Medicare Annual Wellness (AWV)  06/27/2023    Colorectal cancer screening: Type of screening: Colonoscopy. Completed 04/04/2021. Repeat every 3 years  Lung Cancer Screening: (Low Dose CT Chest recommended if Age 73-80 years, 20 pack-year currently smoking OR have quit w/in 15years.) does not qualify.   Lung Cancer Screening Referral: na  Additional Screening:  Hepatitis C Screening: does not qualify; Completed 05/25/2021  Vision Screening: Recommended annual ophthalmology exams for early detection of glaucoma and other disorders of the  eye. Is the patient up to date with their annual eye exam?  Yes  Who is the provider or what is the name of the office in which the patient attends annual eye exams? Dr. Daisy Lazar If pt is not established with a provider, would they like to be referred to a provider to establish care? No .   Dental Screening: Recommended annual dental exams for proper oral hygiene  Diabetic Foot Exam:Patient is overdue for a diabetic foot exam. Patient states he sees  Theola Sequin at [facility] Community Resource Referral / Chronic Care Management: CRR required this visit?  No   CCM required this visit?  No     Plan:     I have personally reviewed and noted the following in the patient's chart:   Medical and social history Use of alcohol, tobacco or illicit drugs  Current medications and supplements including opioid prescriptions. Patient is currently taking opioid prescriptions. Information provided to patient regarding non-opioid alternatives. Patient advised to discuss non-opioid treatment plan with their provider. Functional ability and status Nutritional status Physical activity Advanced directives List of other physicians Hospitalizations, surgeries, and ER visits in previous 12 months Vitals Screenings to include cognitive, depression, and falls Referrals and appointments  In addition, I have reviewed and discussed with patient certain preventive protocols, quality metrics, and best practice recommendations. A written personalized care plan for preventive services as well as general preventive health recommendations were provided to patient.     Jordan Hawks Shya Kovatch, CMA   05/31/2023   After Visit Summary: (MyChart) Due to this being a telephonic visit, the after visit summary with patients personalized plan was offered to patient via MyChart   Nurse Notes: Last foot exam notes requested

## 2023-06-04 ENCOUNTER — Other Ambulatory Visit: Payer: Self-pay | Admitting: Family Medicine

## 2023-06-06 ENCOUNTER — Other Ambulatory Visit: Payer: Self-pay | Admitting: Cardiology

## 2023-06-11 ENCOUNTER — Encounter (INDEPENDENT_AMBULATORY_CARE_PROVIDER_SITE_OTHER): Payer: Self-pay | Admitting: *Deleted

## 2023-06-13 DIAGNOSIS — M79671 Pain in right foot: Secondary | ICD-10-CM | POA: Diagnosis not present

## 2023-06-13 DIAGNOSIS — M79672 Pain in left foot: Secondary | ICD-10-CM | POA: Diagnosis not present

## 2023-06-13 DIAGNOSIS — E1142 Type 2 diabetes mellitus with diabetic polyneuropathy: Secondary | ICD-10-CM | POA: Diagnosis not present

## 2023-06-17 ENCOUNTER — Encounter: Payer: Self-pay | Admitting: *Deleted

## 2023-06-19 ENCOUNTER — Ambulatory Visit (INDEPENDENT_AMBULATORY_CARE_PROVIDER_SITE_OTHER): Payer: PPO | Admitting: Family Medicine

## 2023-06-19 VITALS — BP 114/79 | HR 81 | Temp 98.8°F | Ht 74.0 in | Wt 265.0 lb

## 2023-06-19 DIAGNOSIS — Z0001 Encounter for general adult medical examination with abnormal findings: Secondary | ICD-10-CM

## 2023-06-19 DIAGNOSIS — J449 Chronic obstructive pulmonary disease, unspecified: Secondary | ICD-10-CM | POA: Diagnosis not present

## 2023-06-19 DIAGNOSIS — Z125 Encounter for screening for malignant neoplasm of prostate: Secondary | ICD-10-CM

## 2023-06-19 DIAGNOSIS — E785 Hyperlipidemia, unspecified: Secondary | ICD-10-CM

## 2023-06-19 DIAGNOSIS — Z0000 Encounter for general adult medical examination without abnormal findings: Secondary | ICD-10-CM

## 2023-06-19 MED ORDER — ALBUTEROL SULFATE HFA 108 (90 BASE) MCG/ACT IN AERS: INHALATION_SPRAY | RESPIRATORY_TRACT | 5 refills | Status: AC

## 2023-06-19 MED ORDER — GABAPENTIN 300 MG PO CAPS: ORAL_CAPSULE | ORAL | 6 refills | Status: DC

## 2023-06-19 MED ORDER — EZETIMIBE 10 MG PO TABS: 10.0000 mg | ORAL_TABLET | Freq: Every day | ORAL | 5 refills | Status: DC

## 2023-06-19 MED ORDER — ATORVASTATIN CALCIUM 40 MG PO TABS: 40.0000 mg | ORAL_TABLET | Freq: Every day | ORAL | 1 refills | Status: DC

## 2023-06-19 NOTE — Progress Notes (Signed)
Subjective:    Patient ID: Jared Griffin, male    DOB: 1961/03/04, 62 y.o.   MRN: [id number]  HPI The patient comes in today for a wellness visit.    A review of their health history was completed.  A review of medications was also completed.  Any needed refills; no  Eating habits: good  Falls/  MVA accidents in past few months: no  Regular exercise: yes walking  Specialist pt sees on regular basis: Oncologist, Cardiologist, podiatry, orthopedist    Preventative health issues were discussed.   Additional concerns: no concerns or issues    Review of Systems     Objective:   Physical Exam General-in no acute distress Eyes-no discharge Lungs-respiratory rate normal, CTA CV-no murmurs,RRR Extremities skin warm dry no edema Neuro grossly normal Behavior normal, alert Eardrums are normal no masses in the neck Abdomen is soft no masses Prostate exam normal No significant edema in the legs       Assessment & Plan:  1. Screening for prostate cancer PSA ordered - PSA  2. Hyperlipidemia, unspecified hyperlipidemia type Keep LDL below 70 continue statins and Zetia check labs - Lipid panel  3. Chronic obstructive pulmonary disease, unspecified COPD type (HCC) Patient no longer smokes Stable currently   4. Well adult exam Adult wellness-complete.wellness physical was conducted today. Importance of diet and exercise were discussed in detail.  Importance of stress reduction and healthy living were discussed.  In addition to this a discussion regarding safety was also covered.  We also reviewed over immunizations and gave recommendations regarding current immunization needed for age.   In addition to this additional areas were also touched on including: Preventative health exams needed:  Colonoscopy July 2025  Patient was advised yearly wellness exam

## 2023-06-21 DIAGNOSIS — E1165 Type 2 diabetes mellitus with hyperglycemia: Secondary | ICD-10-CM | POA: Diagnosis not present

## 2023-06-21 DIAGNOSIS — Z125 Encounter for screening for malignant neoplasm of prostate: Secondary | ICD-10-CM | POA: Diagnosis not present

## 2023-06-21 DIAGNOSIS — E785 Hyperlipidemia, unspecified: Secondary | ICD-10-CM | POA: Diagnosis not present

## 2023-06-22 LAB — PSA: Prostate Specific Ag, Serum: 0.4 ng/mL (ref 0.0–4.0)

## 2023-06-22 LAB — LIPID PANEL
Chol/HDL Ratio: 4.8 {ratio} (ref 0.0–5.0)
Cholesterol, Total: 150 mg/dL (ref 100–199)
HDL: 31 mg/dL — ABNORMAL LOW (ref >39)
LDL Chol Calc (NIH): 69 mg/dL (ref 0–99)
Triglycerides: 315 mg/dL — ABNORMAL HIGH (ref 0–149)
VLDL Cholesterol Cal: 50 mg/dL — ABNORMAL HIGH (ref 5–40)

## 2023-06-24 NOTE — Progress Notes (Signed)
This has been requested from [facility] via fax

## 2023-07-12 DIAGNOSIS — I2510 Atherosclerotic heart disease of native coronary artery without angina pectoris: Secondary | ICD-10-CM | POA: Diagnosis not present

## 2023-07-12 DIAGNOSIS — I10 Essential (primary) hypertension: Secondary | ICD-10-CM | POA: Diagnosis not present

## 2023-07-12 DIAGNOSIS — E1165 Type 2 diabetes mellitus with hyperglycemia: Secondary | ICD-10-CM | POA: Diagnosis not present

## 2023-07-25 DIAGNOSIS — M79672 Pain in left foot: Secondary | ICD-10-CM | POA: Diagnosis not present

## 2023-07-25 DIAGNOSIS — E1142 Type 2 diabetes mellitus with diabetic polyneuropathy: Secondary | ICD-10-CM | POA: Diagnosis not present

## 2023-07-25 DIAGNOSIS — M79671 Pain in right foot: Secondary | ICD-10-CM | POA: Diagnosis not present

## 2023-08-05 ENCOUNTER — Other Ambulatory Visit (INDEPENDENT_AMBULATORY_CARE_PROVIDER_SITE_OTHER): Payer: Self-pay | Admitting: *Deleted

## 2023-08-05 MED ORDER — OMEPRAZOLE 40 MG PO CPDR: DELAYED_RELEASE_CAPSULE | ORAL | 1 refills | Status: DC

## 2023-08-07 ENCOUNTER — Other Ambulatory Visit: Payer: Self-pay | Admitting: Family Medicine

## 2023-08-08 ENCOUNTER — Other Ambulatory Visit: Payer: Self-pay | Admitting: Physical Medicine and Rehabilitation

## 2023-08-08 DIAGNOSIS — M5416 Radiculopathy, lumbar region: Secondary | ICD-10-CM

## 2023-08-21 ENCOUNTER — Other Ambulatory Visit: Payer: Self-pay | Admitting: Neurology

## 2023-08-21 DIAGNOSIS — R251 Tremor, unspecified: Secondary | ICD-10-CM

## 2023-08-21 DIAGNOSIS — M542 Cervicalgia: Secondary | ICD-10-CM

## 2023-08-22 ENCOUNTER — Other Ambulatory Visit: Payer: Self-pay | Admitting: Family Medicine

## 2023-08-22 DIAGNOSIS — M542 Cervicalgia: Secondary | ICD-10-CM

## 2023-08-22 DIAGNOSIS — R251 Tremor, unspecified: Secondary | ICD-10-CM

## 2023-08-23 ENCOUNTER — Other Ambulatory Visit: Payer: Self-pay | Admitting: Cardiology

## 2023-08-23 DIAGNOSIS — M542 Cervicalgia: Secondary | ICD-10-CM

## 2023-08-23 DIAGNOSIS — R251 Tremor, unspecified: Secondary | ICD-10-CM

## 2023-08-28 ENCOUNTER — Other Ambulatory Visit: Payer: Self-pay

## 2023-08-28 ENCOUNTER — Ambulatory Visit: Payer: PPO | Admitting: Physical Medicine and Rehabilitation

## 2023-08-28 DIAGNOSIS — M5416 Radiculopathy, lumbar region: Secondary | ICD-10-CM

## 2023-08-28 MED ORDER — METHYLPREDNISOLONE ACETATE 40 MG/ML IJ SUSP
40.0000 mg | Freq: Once | INTRAMUSCULAR | Status: AC
Start: 2023-08-28 — End: 2023-08-28
  Administered 2023-08-28: 40 mg

## 2023-08-28 NOTE — Progress Notes (Signed)
DONTAVION EVEN - 62 y.o. male MRN [id number]  Date of birth: 19-Jun-1961  Office Visit Note: Visit Date: 08/28/2023 PCP: Babs Sciara, MD Referred by: Babs Sciara, MD  Subjective: Chief Complaint  Patient presents with   Lower Back - Pain   HPI:  CLINTON WHITMARSH is a 62 y.o. male who comes in today for planned repeat Right L3-4  Lumbar Interlaminar epidural steroid injection with fluoroscopic guidance.  The patient has failed conservative care including home exercise, medications, time and activity modification.  This injection will be diagnostic and hopefully therapeutic.  Please see requesting physician notes for further details and justification. Patient received more than 50% pain relief from prior injection.   Referring: Ellin Goodie, FNP   ROS Otherwise per HPI.  Assessment & Plan: Visit Diagnoses:    ICD-10-CM   1. Lumbar radiculopathy  M54.16 XR C-ARM NO REPORT    Epidural Steroid injection    methylPREDNISolone acetate (DEPO-MEDROL) injection 40 mg      Plan: No additional findings.   Meds & Orders:  Meds ordered this encounter  Medications   methylPREDNISolone acetate (DEPO-MEDROL) injection 40 mg    Orders Placed This Encounter  Procedures   XR C-ARM NO REPORT   Epidural Steroid injection    Follow-up: Return for visit to requesting provider as needed.   Procedures: No procedures performed  Lumbar Epidural Steroid Injection - Interlaminar Approach with Fluoroscopic Guidance  Patient: KAZUMA AYALA      Date of Birth: [birth year] MRN: [id number] PCP: Babs Sciara, MD      Visit Date: 08/28/2023   Universal Protocol:     Consent Given By: the patient  Position: PRONE  Additional Comments: Vital signs were monitored before and after the procedure. Patient was prepped and draped in the usual sterile fashion. The correct patient, procedure, and site was verified.   Injection Procedure Details:   Procedure diagnoses: Lumbar  radiculopathy [M54.16]   Meds Administered:  Meds ordered this encounter  Medications   methylPREDNISolone acetate (DEPO-MEDROL) injection 40 mg     Laterality: Right  Location/Site:  L3-4  Needle: 4.5 in., 20 ga. Tuohy  Needle Placement: Paramedian epidural  Findings:   -Comments: Excellent flow of contrast into the epidural space.  Procedure Details: Using a paramedian approach from the side mentioned above, the region overlying the inferior lamina was localized under fluoroscopic visualization and the soft tissues overlying this structure were infiltrated with 4 ml. of 1% Lidocaine without Epinephrine. The Tuohy needle was inserted into the epidural space using a paramedian approach.   The epidural space was localized using loss of resistance along with counter oblique bi-planar fluoroscopic views.  After negative aspirate for air, blood, and CSF, a 2 ml. volume of Isovue-250 was injected into the epidural space and the flow of contrast was observed. Radiographs were obtained for documentation purposes.    The injectate was administered into the level noted above.   Additional Comments:  No complications occurred Dressing: 2 x 2 sterile gauze and Band-Aid    Post-procedure details: Patient was observed during the procedure. Post-procedure instructions were reviewed.  Patient left the clinic in stable condition.   Clinical History: CLINICAL DATA:  Low back pain, symptoms persist with greater than 6 weeks of treatment.   EXAM: MRI LUMBAR SPINE WITHOUT CONTRAST   TECHNIQUE: Multiplanar, multisequence MR imaging of the lumbar spine was performed. No intravenous contrast was administered.   COMPARISON:  MRI of the lumbar spine  10/10/19.   FINDINGS: Segmentation: 5 non rib-bearing lumbar type vertebral bodies are present. The lowest fully formed vertebral body is L5.   Alignment:  Slight retrolisthesis at L3-4 is stable.   Vertebrae: Prominent hemangioma at L3 is  stable. Marrow signal and vertebral body heights are otherwise normal.   Conus medullaris and cauda equina: Conus extends to the L1 level. Conus and cauda equina appear normal.   Paraspinal and other soft tissues: A 13 mm simple cyst is present in the right kidney. No other focal lesions are present. Recommend no imaging follow-up for this lesion.   Disc levels:   T12-L1: Normal disc signal and height is present. No focal protrusion or stenosis is present.   L1-2: Far left annular tear is new since the prior study. No compressive stenosis is present.   L2-3: A far left lateral disc protrusion is new since the prior study. No focal stenosis is present.   L4-5: Disc desiccation and a leftward disc protrusion is present. This has progressed some. Mild left subarticular and foraminal stenosis has progressed. The right foramen is patent.   L5-S1: Normal disc signal and height is present. No focal protrusion or stenosis is present.   IMPRESSION: 1. Progressive leftward disc protrusion at L4-5 with mild left subarticular and foraminal stenosis. 2. New far left lateral disc protrusion at L2-3 without focal stenosis. 3. Far left annular tear at L1-2 is new since the prior study. No compressive stenosis is present.     Electronically Signed   By: Marin Roberts M.D.   On: 02/03/2023 09:50     Objective:  VS:  HT:    WT:   BMI:     BP:   HR: bpm  TEMP: ( )  RESP:  Physical Exam Vitals and nursing note reviewed.  Constitutional:      General: He is not in acute distress.    Appearance: Normal appearance. He is obese. He is not ill-appearing.  HENT:     Head: Normocephalic and atraumatic.     Right Ear: External ear normal.     Left Ear: External ear normal.     Nose: No congestion.  Eyes:     Extraocular Movements: Extraocular movements intact.  Cardiovascular:     Rate and Rhythm: Normal rate.     Pulses: Normal pulses.  Pulmonary:     Effort: Pulmonary  effort is normal. No respiratory distress.  Abdominal:     General: There is no distension.     Palpations: Abdomen is soft.  Musculoskeletal:        General: No tenderness or signs of injury.     Cervical back: Neck supple.     Right lower leg: No edema.     Left lower leg: No edema.     Comments: Patient has good distal strength without clonus.  Skin:    Findings: No erythema or rash.  Neurological:     General: No focal deficit present.     Mental Status: He is alert and oriented to person, place, and time.     Sensory: No sensory deficit.     Motor: No weakness or abnormal muscle tone.     Coordination: Coordination normal.  Psychiatric:        Mood and Affect: Mood normal.        Behavior: Behavior normal.      Imaging: No results found.

## 2023-08-28 NOTE — Patient Instructions (Signed)

## 2023-08-28 NOTE — Procedures (Signed)
Lumbar Epidural Steroid Injection - Interlaminar Approach with Fluoroscopic Guidance  Patient: Jared Griffin      Date of Birth: [birth year] MRN: [id number] PCP: Babs Sciara, MD      Visit Date: 08/28/2023   Universal Protocol:     Consent Given By: the patient  Position: PRONE  Additional Comments: Vital signs were monitored before and after the procedure. Patient was prepped and draped in the usual sterile fashion. The correct patient, procedure, and site was verified.   Injection Procedure Details:   Procedure diagnoses: Lumbar radiculopathy [M54.16]   Meds Administered:  Meds ordered this encounter  Medications   methylPREDNISolone acetate (DEPO-MEDROL) injection 40 mg     Laterality: Right  Location/Site:  L3-4  Needle: 4.5 in., 20 ga. Tuohy  Needle Placement: Paramedian epidural  Findings:   -Comments: Excellent flow of contrast into the epidural space.  Procedure Details: Using a paramedian approach from the side mentioned above, the region overlying the inferior lamina was localized under fluoroscopic visualization and the soft tissues overlying this structure were infiltrated with 4 ml. of 1% Lidocaine without Epinephrine. The Tuohy needle was inserted into the epidural space using a paramedian approach.   The epidural space was localized using loss of resistance along with counter oblique bi-planar fluoroscopic views.  After negative aspirate for air, blood, and CSF, a 2 ml. volume of Isovue-250 was injected into the epidural space and the flow of contrast was observed. Radiographs were obtained for documentation purposes.    The injectate was administered into the level noted above.   Additional Comments:  No complications occurred Dressing: 2 x 2 sterile gauze and Band-Aid    Post-procedure details: Patient was observed during the procedure. Post-procedure instructions were reviewed.  Patient left the clinic in stable condition.

## 2023-08-28 NOTE — Progress Notes (Signed)
Functional Pain Scale - descriptive words and definitions  Moderate (4)   Constantly aware of pain, can complete ADLs with modification/sleep marginally affected at times/passive distraction is of no use, but active distraction gives some relief. Moderate range order  Average Pain 4 R leg pain  100/66  +Driver, -BT, -Dye Allergies.

## 2023-09-05 DIAGNOSIS — M79671 Pain in right foot: Secondary | ICD-10-CM | POA: Diagnosis not present

## 2023-09-05 DIAGNOSIS — M79672 Pain in left foot: Secondary | ICD-10-CM | POA: Diagnosis not present

## 2023-09-05 DIAGNOSIS — E1142 Type 2 diabetes mellitus with diabetic polyneuropathy: Secondary | ICD-10-CM | POA: Diagnosis not present

## 2023-10-21 ENCOUNTER — Ambulatory Visit (INDEPENDENT_AMBULATORY_CARE_PROVIDER_SITE_OTHER): Payer: PPO | Admitting: Nurse Practitioner

## 2023-10-21 ENCOUNTER — Encounter: Payer: Self-pay | Admitting: Hematology

## 2023-10-21 VITALS — BP 110/76 | HR 79 | Temp 98.0°F | Ht 74.0 in | Wt 255.0 lb

## 2023-10-21 DIAGNOSIS — J019 Acute sinusitis, unspecified: Secondary | ICD-10-CM

## 2023-10-21 DIAGNOSIS — B9689 Other specified bacterial agents as the cause of diseases classified elsewhere: Secondary | ICD-10-CM

## 2023-10-21 MED ORDER — AMOXICILLIN-POT CLAVULANATE 875-125 MG PO TABS
1.0000 | ORAL_TABLET | Freq: Two times a day (BID) | ORAL | 0 refills | Status: DC
Start: 2023-10-21 — End: 2023-10-30

## 2023-10-21 MED ORDER — FLUTICASONE PROPIONATE 50 MCG/ACT NA SUSP: 2.0000 | Freq: Every day | NASAL | 0 refills | Status: AC

## 2023-10-21 NOTE — Progress Notes (Signed)
   Subjective:    Patient ID: Jared Griffin, male    DOB: 04-07-1961, 63 y.o.   MRN: [id number]  HPI Presents with complaints of sinus symptoms for the past 5 days, worse over the past 3 days.  Pressure in both ears, some left ear pain.  Intense facial area headache, slightly worse on the left side.  Occasional cough with postnasal drainage.  No sore throat.  No fever.  Used his steroid nasal spray 1 time yesterday.  Has also tried some Sudafed.  States his sugars are stable.  Has a complex medical history including cardiac issues, diabetes and COPD.   Review of Systems  Constitutional:  Negative for fever.  HENT:  Positive for congestion, ear pain, postnasal drip, sinus pressure and sinus pain. Negative for sore throat.   Respiratory:  Negative for cough, chest tightness, shortness of breath and wheezing.   Cardiovascular:  Negative for chest pain.  Neurological:  Positive for headaches.       Objective:   Physical Exam NAD.  Alert, oriented.  TMs clear effusion bilaterally, no erythema.  Pharynx clear and moist.  Neck supple with mild soft anterior cervical adenopathy.  Lungs clear.  Heart regular rate rhythm. Today's Vitals   10/21/23 1110  BP: 110/76  Pulse: 79  Temp: 98 F (36.7 C)  SpO2: 97%  Weight: 255 lb (115.7 kg)  Height: 6\' 2"  (1.88 m)   Body mass index is 32.74 kg/m.        Assessment & Plan:  Acute bacterial rhinosinusitis Meds ordered this encounter  Medications   amoxicillin-clavulanate (AUGMENTIN) 875-125 MG tablet    Sig: Take 1 tablet by mouth 2 (two) times daily.    Dispense:  14 tablet    Refill:  0    Supervising Provider:   Lilyan Punt A [[id number]]   fluticasone (FLONASE) 50 MCG/ACT nasal spray    Sig: Place 2 sprays into both nostrils daily.    Dispense:  16 g    Refill:  0    Supervising Provider:   Lilyan Punt A [[id number]]   Start Augmentin as directed. Restart Flonase daily as directed. Given samples of Mucinex for congestion.  Avoid  excessive use of decongestants due to his cardiac history. Warning signs reviewed.  Call back if symptoms worsen or persist.

## 2023-10-30 ENCOUNTER — Ambulatory Visit: Payer: PPO | Attending: Cardiology | Admitting: Cardiology

## 2023-10-30 ENCOUNTER — Encounter: Payer: Self-pay | Admitting: Cardiology

## 2023-10-30 VITALS — BP 114/70 | HR 97 | Ht 74.0 in | Wt 248.8 lb

## 2023-10-30 DIAGNOSIS — I10 Essential (primary) hypertension: Secondary | ICD-10-CM | POA: Diagnosis not present

## 2023-10-30 DIAGNOSIS — E782 Mixed hyperlipidemia: Secondary | ICD-10-CM | POA: Diagnosis not present

## 2023-10-30 DIAGNOSIS — I25119 Atherosclerotic heart disease of native coronary artery with unspecified angina pectoris: Secondary | ICD-10-CM

## 2023-10-30 DIAGNOSIS — I2089 Other forms of angina pectoris: Secondary | ICD-10-CM

## 2023-10-30 NOTE — Progress Notes (Signed)
    Cardiology Office Note  Date: 10/30/2023   ID: Jared Griffin, DOB 09-Mar-1961, MRN [id number]  History of Present Illness: Jared Griffin is a 63 y.o. male last seen in August 2024.  He is here for a routine visit.  Reports no escalating angina or increasing nitroglycerin use.  He does describe intermittent orthostatic lightheadedness and tends to run a low normal blood pressure at baseline on his current regimen.  I reviewed his medications.  Current regimen includes aspirin, Norvasc, Lipitor, Zetia, Farxiga, Isordil, Cozaar, Inderal, Ranexa, and as needed nitroglycerin.  We discussed stopping Cozaar for now and continuing to monitor blood her blood pressure at home.  I reviewed his interval lab work, LDL was 69 in October 2024.  Physical Exam: VS:  BP 114/70 (BP Location: Left Arm, Patient Position: Sitting, Cuff Size: Normal)   Pulse 97   Ht 6\' 2"  (1.88 m)   Wt 248 lb 12.8 oz (112.9 kg)   SpO2 97%   BMI 31.94 kg/m , BMI Body mass index is 31.94 kg/m.  Wt Readings from Last 3 Encounters:  10/30/23 248 lb 12.8 oz (112.9 kg)  10/21/23 255 lb (115.7 kg)  06/19/23 265 lb (120.2 kg)    General: Patient appears comfortable at rest. HEENT: Conjunctiva and lids normal. Lungs: Clear to auscultation, nonlabored breathing at rest. Cardiac: Regular rate and rhythm, no S3 or significant systolic murmur.  ECG:  An ECG dated 04/23/2023 was personally reviewed today and demonstrated:  Sinus rhythm with prolonged PR interval.  Labwork: 12/17/2022: ALT 37; AST 23; BUN 21; Creat 0.87; Potassium 4.4; Sodium 138 04/26/2023: Hemoglobin 15.4; Platelets 161     Component Value Date/Time   CHOL 150 06/21/2023 0900   TRIG 315 (H) 06/21/2023 0900   HDL 31 (L) 06/21/2023 0900   CHOLHDL 4.8 06/21/2023 0900   CHOLHDL 6.4 07/12/2018 0713   VLDL UNABLE TO CALCULATE IF TRIGLYCERIDE OVER 400 mg/dL 08/65/7846 9629   LDLCALC 69 06/21/2023 0900   Other Studies Reviewed Today:  No interval cardiac  testing for review today.  Assessment and Plan:  1.  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  LVEF 60 to 65% by echocardiogram in June 2023.  Plan to continue medical therapy and observation.  Currently on aspirin, Norvasc, Farxiga, Lipitor, Zetia, Isordil, Inderal, Ranexa, and as needed nitroglycerin.   2.  Mixed hyperlipidemia.  LDL 69 in October 2024.  Continue Lipitor and Zetia.   3.  Primary hypertension.  Also having episodes of orthostasis with low normal blood pressure on current therapy.  Plan to stop Cozaar completely at this point and continue to track blood pressure.  He has lost about 25 pounds as well on Mounjaro.  Disposition:  Follow up  6 months.  Signed, Jonelle Sidle, M.D., F.A.C.C. [facility]

## 2023-10-30 NOTE — Patient Instructions (Signed)
Medication Instructions:   STOP Losartan   Labwork: None today  Testing/Procedures: None today  Follow-Up: 6 months  Any Other Special Instructions Will Be Listed Below (If Applicable).  If you need a refill on your cardiac medications before your next appointment, please call your pharmacy.

## 2023-10-31 ENCOUNTER — Other Ambulatory Visit: Payer: Self-pay

## 2023-10-31 DIAGNOSIS — D509 Iron deficiency anemia, unspecified: Secondary | ICD-10-CM

## 2023-11-01 ENCOUNTER — Inpatient Hospital Stay: Payer: PPO | Attending: Hematology

## 2023-11-01 DIAGNOSIS — D509 Iron deficiency anemia, unspecified: Secondary | ICD-10-CM | POA: Insufficient documentation

## 2023-11-01 LAB — CBC WITH DIFFERENTIAL/PLATELET
Abs Immature Granulocytes: 0.03 10*3/uL (ref 0.00–0.07)
Basophils Absolute: 0.1 10*3/uL (ref 0.0–0.1)
Basophils Relative: 1 %
Eosinophils Absolute: 0.4 10*3/uL (ref 0.0–0.5)
Eosinophils Relative: 5 %
HCT: 52.2 % — ABNORMAL HIGH (ref 39.0–52.0)
Hemoglobin: 17.3 g/dL — ABNORMAL HIGH (ref 13.0–17.0)
Immature Granulocytes: 0 %
Lymphocytes Relative: 35 %
Lymphs Abs: 2.6 10*3/uL (ref 0.7–4.0)
MCH: 29 pg (ref 26.0–34.0)
MCHC: 33.1 g/dL (ref 30.0–36.0)
MCV: 87.6 fL (ref 80.0–100.0)
Monocytes Absolute: 0.6 10*3/uL (ref 0.1–1.0)
Monocytes Relative: 8 %
Neutro Abs: 3.8 10*3/uL (ref 1.7–7.7)
Neutrophils Relative %: 51 %
Platelets: 170 10*3/uL (ref 150–400)
RBC: 5.96 MIL/uL — ABNORMAL HIGH (ref 4.22–5.81)
RDW: 13.4 % (ref 11.5–15.5)
WBC: 7.4 10*3/uL (ref 4.0–10.5)
nRBC: 0 % (ref 0.0–0.2)

## 2023-11-01 LAB — IRON AND TIBC
Iron: 142 ug/dL (ref 45–182)
Saturation Ratios: 31 % (ref 17.9–39.5)
TIBC: 452 ug/dL — ABNORMAL HIGH (ref 250–450)
UIBC: 310 ug/dL

## 2023-11-01 LAB — FERRITIN: Ferritin: 138 ng/mL (ref 24–336)

## 2023-11-05 ENCOUNTER — Telehealth (INDEPENDENT_AMBULATORY_CARE_PROVIDER_SITE_OTHER): Payer: PPO | Admitting: Psychiatry

## 2023-11-05 ENCOUNTER — Encounter (HOSPITAL_COMMUNITY): Payer: Self-pay | Admitting: Psychiatry

## 2023-11-05 DIAGNOSIS — F321 Major depressive disorder, single episode, moderate: Secondary | ICD-10-CM | POA: Diagnosis not present

## 2023-11-05 DIAGNOSIS — F411 Generalized anxiety disorder: Secondary | ICD-10-CM | POA: Diagnosis not present

## 2023-11-05 MED ORDER — SERTRALINE HCL 100 MG PO TABS: 150.0000 mg | ORAL_TABLET | Freq: Every day | ORAL | 3 refills | Status: DC

## 2023-11-05 MED ORDER — ALPRAZOLAM 1 MG PO TABS: 1.0000 mg | ORAL_TABLET | Freq: Three times a day (TID) | ORAL | 3 refills | Status: DC | PRN

## 2023-11-05 NOTE — Progress Notes (Signed)
 Virtual Visit via Video Note  I connected with Jared Griffin on 11/05/23 at  9:20 AM EST by a video enabled telemedicine application and verified that I am speaking with the correct person using two identifiers.  Location: Patient: home Provider: office   I discussed the limitations of evaluation and management by telemedicine and the availability of in person appointments. The patient expressed understanding and agreed to proceed.      I discussed the assessment and treatment plan with the patient. The patient was provided an opportunity to ask questions and all were answered. The patient agreed with the plan and demonstrated an understanding of the instructions.   The patient was advised to call back or seek an in-person evaluation if the symptoms worsen or if the condition fails to improve as anticipated.  I provided 20 minutes of non-face-to-face time during this encounter.   [name], MD  [facility] MD/PA/NP OP Progress Note  11/05/2023 9:41 AM Jared Griffin  MRN:  [id number]  Chief Complaint:  Chief Complaint  Patient presents with   Depression   Anxiety   Follow-up   HPI:  This patient is a 63 year old married white male lives with his wife and son in [place]. 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 He had also been working at [employer] but had an arm injury at work and had to quit there as well.   The patient returns for follow-up after about 8 months regarding his depression and anxiety.  He states in some regard things are going well for him.  He is now on [facility] and has lost 25 pounds.  He is also off the metformin.  His blood sugars seem to be stabilizing.  He still has a chronic back pain but the injections are helping.  He states he still has conflicts with his wife over her spending.  By his report she gets credit cards and maxes them out and keeps his secretive until he finds out when  the bills come in the mail.  He states that this is bone going on for years and he really cannot afford to keep going like this.  He is asked her to get counseling.  I suggested maybe starting with pastoral counseling and he is going to try.  He has made it clear to her however the this continues he is going to have to leave.  He still thinks that the Zoloft is helping his mood as he is not terribly depressed or definitely not suicidal.  The Xanax continues to help anxiety and sleep to some degree.  He tried trazodone in the past but it made him too groggy.  He does not want to try any other sleeping pills. Visit Diagnosis:    ICD-10-CM   1. Moderate single current episode of major depressive disorder (HCC)  F32.1     2. Generalized anxiety disorder  F41.1       Past Psychiatric History: none  Past Medical History:  Past Medical History:  Diagnosis Date   Anxiety    COPD (chronic obstructive pulmonary disease) (HCC)    Coronary atherosclerosis of native coronary artery    a. 12/09/2013: s/p PCI with 3.0 x 28 Promus DES to mLAD   Depression    Diabetic neuropathy (HCC)    Elbow injury 07/25/2017   Surgery for torn tendon   Essential hypertension    Fatty liver disease, nonalcoholic  GERD (gastroesophageal reflux disease)    Iron deficiency anemia 08/15/2021   Lateral epicondylitis of right elbow    Mixed hyperlipidemia    Obesity    Osgood-Schlatter's disease of right knee    Skin cyst 10/03/2017   Removed from back   Type 2 diabetes mellitus [facility])     Past Surgical History:  Procedure Laterality Date   BIOPSY  01/21/2019   Procedure: BIOPSY;  Surgeon: Malissa Hippo, MD;  [facility];  Service: Endoscopy;;  gastric bx and gastric polyps   BIOPSY  11/08/2020   Procedure: BIOPSY;  Surgeon: Dolores Frame, MD;  [facility];  Service: Gastroenterology;;   CARDIAC CATHETERIZATION  2011   CARDIAC CATHETERIZATION N/A 09/20/2015   Procedure: Left  Heart Cath and Coronary Angiography;  Surgeon: Kathleene Hazel, MD;  Location: [facility] INVASIVE CV LAB;  Service: Cardiovascular;  Laterality: N/A;   CHOLECYSTECTOMY  2003   COLONOSCOPY  06/19/2012   Procedure: COLONOSCOPY;  Surgeon: Malissa Hippo, MD;  [facility];  Service: Endoscopy;  Laterality: N/A;  930   COLONOSCOPY N/A 10/17/2017   Procedure: COLONOSCOPY;  Surgeon: Malissa Hippo, MD;  [facility];  Service: Endoscopy;  Laterality: N/A;  1225   COLONOSCOPY WITH PROPOFOL N/A 11/08/2020   Procedure: COLONOSCOPY WITH PROPOFOL;  Surgeon: Dolores Frame, MD;  [facility];  Service: Gastroenterology;  Laterality: N/A;  10:45   COLONOSCOPY WITH PROPOFOL N/A 04/04/2021   Procedure: COLONOSCOPY WITH PROPOFOL;  Surgeon: Dolores Frame, MD;  [facility];  Service: Gastroenterology;  Laterality: N/A;   CORONARY PRESSURE/FFR STUDY N/A 04/10/2022   Procedure: INTRAVASCULAR PRESSURE WIRE/FFR STUDY;  Surgeon: Lyn Records, MD;  Location: [facility] INVASIVE CV LAB;  Service: Cardiovascular;  Laterality: N/A;   CYST EXCISION  07/2019   ESOPHAGOGASTRODUODENOSCOPY (EGD) WITH PROPOFOL N/A 01/21/2019   Procedure: ESOPHAGOGASTRODUODENOSCOPY (EGD) WITH PROPOFOL;  Surgeon: Malissa Hippo, MD;  [facility];  Service: Endoscopy;  Laterality: N/A;  10:15am   ESOPHAGOGASTRODUODENOSCOPY (EGD) WITH PROPOFOL N/A 02/17/2021   Procedure: ESOPHAGOGASTRODUODENOSCOPY (EGD) WITH PROPOFOL;  Surgeon: Dolores Frame, MD;  [facility];  Service: Gastroenterology;  Laterality: N/A;  10:20   ESOPHAGOGASTRODUODENOSCOPY (EGD) WITH PROPOFOL N/A 04/04/2021   Procedure: ESOPHAGOGASTRODUODENOSCOPY (EGD) WITH PROPOFOL;  Surgeon: Dolores Frame, MD;  [facility];  Service: Gastroenterology;  Laterality: N/A;  12:30   GIVENS CAPSULE STUDY N/A 09/13/2021   Procedure: GIVENS CAPSULE STUDY;  Surgeon: Dolores Frame, MD;  [facility];  Service: Gastroenterology;  Laterality: N/A;  7:30   KNEE ARTHROPLASTY Right 1999   LEFT HEART CATH AND CORONARY ANGIOGRAPHY N/A 02/26/2018   Procedure: LEFT HEART CATH AND CORONARY ANGIOGRAPHY;  Surgeon: Corky Crafts, MD;  Location: [facility] INVASIVE CV LAB;  Service: Cardiovascular;  Laterality: N/A;   LEFT HEART CATH AND CORONARY ANGIOGRAPHY N/A 04/10/2022   Procedure: LEFT HEART CATH AND CORONARY ANGIOGRAPHY;  Surgeon: Lyn Records, MD;  Location: [facility] INVASIVE CV LAB;  Service: Cardiovascular;  Laterality: N/A;   LEFT HEART CATHETERIZATION WITH CORONARY ANGIOGRAM N/A 12/09/2013   Procedure: LEFT HEART CATHETERIZATION WITH CORONARY ANGIOGRAM;  Surgeon: Corky Crafts, MD;  Location: [facility] CATH LAB;  Service: Cardiovascular;  Laterality: N/A;   Liver biopsy     POLYPECTOMY  10/17/2017   Procedure: POLYPECTOMY;  Surgeon: Malissa Hippo, MD;  [facility];  Service: Endoscopy;;  ascending colon,  splenic flexure,sigmoid x2   POLYPECTOMY  11/08/2020   Procedure: POLYPECTOMY;  Surgeon: Dolores Frame, MD;  [facility];  Service: Gastroenterology;;   POLYPECTOMY  04/04/2021   Procedure: POLYPECTOMY;  Surgeon: Dolores Frame, MD;  [facility];  Service: Gastroenterology;;   TENNIS ELBOW RELEASE/NIRSCHEL PROCEDURE Right 07/25/2017   Procedure: TENNIS ELBOW RELEASE and debriedment;  Surgeon: Vickki Hearing, MD;  Location: [facility];  Service: Orthopedics;  Laterality: Right;   TONSILLECTOMY  1970's    Family Psychiatric History: See below  Family History:  Family History  Problem Relation Age of Onset   Osteoporosis Mother    Cancer Maternal Aunt    CVA Maternal Grandmother    CVA Maternal Grandfather    Healthy Son     Social History:  Social History   Socioeconomic History   Marital status: Married    Spouse name: Lance Bosch   Number of children: 1   Years of education: Not on file   Highest  education level: 12th grade  Occupational History   Not on file  Tobacco Use   Smoking status: Former    Current packs/day: 0.00    Average packs/day: 0.5 packs/day for 10.6 years (5.3 ttl pk-yrs)    Types: Cigarettes    Start date: 02/02/1973    Quit date: 09/17/1983    Years since quitting: 40.1   Smokeless tobacco: Former    Types: Chew    Quit date: 09/17/1983  Vaping Use   Vaping status: Never Used  Substance and Sexual Activity   Alcohol use: No    Alcohol/week: 0.0 standard drinks of alcohol   Drug use: No   Sexual activity: Yes  Other Topics Concern   Not on file  Social History Narrative   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    Married with 1 child-son   Right handed    12 th    Very little caffeine   One story 12 stairs up to door and 8 steps to front door   Social Drivers of Health   Financial Resource Strain: Patient Declined (10/21/2023)   Overall Financial Resource Strain (CARDIA)    Difficulty of Paying Living Expenses: Patient declined  Food Insecurity: Patient Declined (10/21/2023)   Hunger Vital Sign    Worried About Running Out of Food in the Last Year: Patient declined    Ran Out of Food in the Last Year: Patient declined  Transportation Needs: No Transportation Needs (10/21/2023)   PRAPARE - Administrator, Civil Service (Medical): No    Lack of Transportation (Non-Medical): No  Physical Activity: Insufficiently Active (10/21/2023)   Exercise Vital Sign    Days of Exercise per Week: 3 days    Minutes of Exercise per Session: 30 min  Stress: Stress Concern Present (10/21/2023)   [employer] of Occupational Health - Occupational Stress Questionnaire    Feeling of Stress : To some extent  Social Connections: Unknown (10/21/2023)   Social Connection and Isolation Panel [NHANES]    Frequency of Communication with Friends and Family: Patient declined    Frequency of Social Gatherings with  Friends and Family: Patient declined    Attends Religious Services: Patient declined    Database administrator or Organizations: Yes    Attends Banker Meetings: Patient declined    Marital Status: Married    Allergies:  Allergies  Allergen Reactions   Bydureon [Exenatide]  Diarrhea    Severe diarrhea and nausea   Ozempic (0.25 Or 0.5 Mg-Dose) [Semaglutide(0.25 Or 0.5mg -Dos)] Diarrhea    Severe nausea and diarrhea   Doxycycline     Severe esophagitis    Metabolic Disorder Labs: Lab Results  Component Value Date   HGBA1C 7.5 (H) 02/07/2022   MPG 211.6 07/12/2018   MPG 197.25 07/23/2017   No results found for: "PROLACTIN" Lab Results  Component Value Date   CHOL 150 06/21/2023   TRIG 315 (H) 06/21/2023   HDL 31 (L) 06/21/2023   CHOLHDL 4.8 06/21/2023   VLDL UNABLE TO CALCULATE IF TRIGLYCERIDE OVER 400 mg/dL 52/84/1324   LDLCALC 69 06/21/2023   LDLCALC 46 06/26/2022   Lab Results  Component Value Date   TSH 0.648 08/20/2019   TSH 0.65 08/20/2019    Therapeutic Level Labs: No results found for: "LITHIUM" No results found for: "VALPROATE" No results found for: "CBMZ"  Current Medications: Current Outpatient Medications  Medication Sig Dispense Refill   acetaminophen (TYLENOL) 500 MG tablet Take 1,500 mg by mouth daily as needed for moderate pain.     albuterol (VENTOLIN HFA) 108 (90 Base) MCG/ACT inhaler INHALE 2 PUFFS EVERY 6 HOURS 8.5 g 5   ALPRAZolam (XANAX) 1 MG tablet Take 1 tablet (1 mg total) by mouth 3 (three) times daily as needed for anxiety. 90 tablet 3   amLODipine (NORVASC) 5 MG tablet TAKE ONE TABLET (5MG  TOTAL) BY MOUTH DAILY 90 tablet 1   aspirin 81 MG EC tablet Take 1 tablet (81 mg total) by mouth daily. 30 tablet 12   atorvastatin (LIPITOR) 40 MG tablet Take 1 tablet (40 mg total) by mouth daily. 90 tablet 1   B-D UF III MINI PEN NEEDLES 31G X 5 MM MISC USE UP TO 4 TIMES DAILY WITH INSULIN AS DIRECTED. 100 each 3   colestipol  (COLESTID) 1 g tablet Take 2 tablets (2 g total) by mouth daily. Take 4 hours apart from other medication (Patient taking differently: Take 1 g by mouth daily as needed (diarrhea). Take 4 hours apart from other medication) 180 tablet 3   Continuous Blood Gluc Sensor (FREESTYLE LIBRE 14 DAY SENSOR) MISC Inject 1 each into the skin every 14 (fourteen) days. Use as directed. 2 each 2   ezetimibe (ZETIA) 10 MG tablet Take 1 tablet (10 mg total) by mouth daily. 30 tablet 5   FARXIGA 10 MG TABS tablet Take 10 mg by mouth daily.     fluticasone (FLONASE) 50 MCG/ACT nasal spray Place 2 sprays into both nostrils daily. 16 g 0   fluticasone-salmeterol (ADVAIR) 250-50 MCG/ACT AEPB Inhale 1 puff into the lungs in the morning and at bedtime. 1 each 5   gabapentin (NEURONTIN) 300 MG capsule TAKE 2 CAPSULES BY MOUTH EACH MORNING AND 2 CAPSULE EACH EVENING. 120 capsule 6   HUMALOG KWIKPEN 200 UNIT/ML KwikPen Inject 30-55 Units into the skin 3 (three) times daily before meals.     HYDROcodone-acetaminophen (NORCO/VICODIN) 5-325 MG tablet Take 1 tablet by mouth every 6 (six) hours as needed.     ibuprofen (ADVIL) 600 MG tablet Take 600 mg by mouth every 6 (six) hours as needed.     isosorbide dinitrate (ISORDIL) 30 MG tablet Take 30 mg by mouth daily at 6 (six) AM.     Lancets MISC 1 each by Does not apply route 4 (four) times daily. 150 each 5   loratadine (CLARITIN) 10 MG tablet Take 10 mg by mouth daily as  needed for allergies.     MOUNJARO 2.5 MG/0.5ML Pen Inject into the skin once a week.     nitroGLYCERIN (NITROSTAT) 0.4 MG SL tablet Place 1 tablet (0.4 mg total) under the tongue every 5 (five) minutes as needed. 25 tablet 3   omeprazole (PRILOSEC) 40 MG capsule Take one daily 90 capsule 1   ondansetron (ZOFRAN-ODT) 8 MG disintegrating tablet TAKE ONE TABLET (8MG  TOTAL) BY MOUTH EVERY 8 HOURS AS NEEDED FRO NAUSEA OR VOMITING 30 tablet 5   propranolol (INDERAL) 20 MG tablet TAKE ONE TABLET (20MG  TOTAL) BY MOUTH  TWO TIMES DAILY 90 tablet 2   ranolazine (RANEXA) 1000 MG SR tablet TAKE ONE TABLET (1000MG  TOTAL) BY MOUTH TWO TIMES DAILY 180 tablet 2   sertraline (ZOLOFT) 100 MG tablet Take 1.5 tablets (150 mg total) by mouth daily. 135 tablet 3   TOUJEO MAX SOLOSTAR 300 UNIT/ML Solostar Pen INJECT 130 UNITS INTO THE SKIN AT BEDTIME. (Patient taking differently: Inject 50-55 Units into the skin at bedtime.) 12 mL 0   traMADol (ULTRAM) 50 MG tablet Take 1 tablet (50 mg total) by mouth every 8 (eight) hours as needed for moderate pain or severe pain. 20 tablet 0   No current facility-administered medications for this visit.     Musculoskeletal: Strength & Muscle Tone: within normal limits Gait & Station: normal Patient leans: N/A  Psychiatric Specialty Exam: Review of Systems  Musculoskeletal:  Positive for back pain.  All other systems reviewed and are negative.   There were no vitals taken for this visit.There is no height or weight on file to calculate BMI.  General Appearance: Casual and Fairly Groomed  Eye Contact:  Good  Speech:  Clear and Coherent  Volume:  Normal  Mood:  Euthymic  Affect:  Congruent  Thought Process:  Goal Directed  Orientation:  Full (Time, Place, and Person)  Thought Content: WDL   Suicidal Thoughts:  No  Homicidal Thoughts:  No  Memory:  Immediate;   Good Recent;   Good Remote;   Good  Judgement:  Good  Insight:  Good  Psychomotor Activity:  Normal  Concentration:  Concentration: Good and Attention Span: Good  Recall:  Good  Fund of Knowledge: Good  Language: Good  Akathisia:  No  Handed:  Right  AIMS (if indicated): not done  Assets:  Communication Skills Desire for Improvement Resilience Social Support Talents/Skills  ADL's:  Intact  Cognition: WNL  Sleep:  Good   Screenings: GAD-7    Flowsheet Row Office Visit from 10/21/2023 in [facility] Office Visit from 06/19/2023 in [facility]  Visit from 08/08/2020 in [facility] Office Visit from 08/05/2018 in [facility]  Total GAD-7 Score 4 4 3 5       PHQ2-9    Flowsheet Row Office Visit from 10/21/2023 in [facility] Office Visit from 06/19/2023 in [facility] Clinical Support from 05/31/2023 in [facility] Office Visit from 09/26/2022 in [facility] Office Visit from 05/22/2022 in [facility]  PHQ-2 Total Score 1 0 0 0 0  PHQ-9 Total Score 2 2 4  -- 0      Flowsheet Row ED from 08/27/2022 in [facility] ED from 08[facility]17/2022 in [facility] ED from 05/17/2022 in [facility] at [facility]  C-SSRS RISK CATEGORY No  Risk No Risk No Risk        Assessment and Plan: This patient is a 63 year old male with a history of depression anxiety and chronic pain.  His mood and and anxiety are stable.  He will continue Zoloft 150 mg daily for depression and Xanax 1 mg up to 3 times daily as needed for anxiety.  He will return to see me in 4 months  Collaboration of Care: Collaboration of Care: Primary Care Provider AEB notes are shared with PCP on the epic system  Patient/Guardian was advised Release of Information must be obtained prior to any record release in order to collaborate their care with an outside provider. Patient/Guardian was advised if they have not already done so to contact the registration department to sign all necessary forms in order for [place] to release information regarding their care.   Consent: Patient/Guardian gives verbal consent for treatment and assignment of benefits for services provided during this visit. Patient/Guardian expressed understanding and agreed to proceed.    [name], MD 11/05/2023, 9:41 AM

## 2023-11-07 ENCOUNTER — Inpatient Hospital Stay (HOSPITAL_BASED_OUTPATIENT_CLINIC_OR_DEPARTMENT_OTHER): Payer: PPO | Admitting: Oncology

## 2023-11-07 DIAGNOSIS — D509 Iron deficiency anemia, unspecified: Secondary | ICD-10-CM | POA: Diagnosis not present

## 2023-11-07 NOTE — Progress Notes (Signed)
 Virtual Visit via Telephone Note  I connected with Jared Griffin on 11/07/23 at  9:00 AM EST by telephone and verified that I am speaking with the correct person using two identifiers.  Location: Patient: Home Provider: Home Office   I discussed the limitations, risks, security and privacy concerns of performing an evaluation and management service by telephone and the availability of in person appointments. I also discussed with the patient that there may be a patient responsible charge related to this service. The patient expressed understanding and agreed to proceed.   History of Present Illness: Patient is a 63 year old male for routine follow-up for iron deficiency anemia.  He was last seen virtually on 05/03/2023 by me.   He receives IV Feraheme intermittently last given on 11/01/2022.  He is currently not on oral iron tablets.  He denies any interval hospitalizations, surgeries or changes to his baseline health.  Has been dealing with a sinus infection for the past few weeks which required an antibiotics. Has been having some breathing problems and angina and was seen by heart care on 10/30/2023.   Reports an occasional nosebleed but nothing like it used to be.  He has added a humidifier to his bedroom which has been helping.    Reports 25 pound weight loss since starting Mounjaro.  Reports he sees his PCP next week to discuss elevated blood sugars.  Observations/Objective: Review of Systems  Constitutional:  Negative for malaise/fatigue.  HENT:  Positive for congestion. Negative for nosebleeds (Occasional).   Respiratory:  Positive for cough and shortness of breath.   Cardiovascular:  Positive for chest pain.  Gastrointestinal:  Negative for blood in stool, constipation and diarrhea.     Physical Exam Neurological:     Mental Status: He is alert and oriented to person, place, and time.     Assessment and Plan: 1. Iron deficiency anemia, unspecified iron deficiency  anemia type (Primary) -Etiology thought to be secondary to malabsorption and some chronic blood loss. -He received intermittent IV iron last given on 11/01/2022.  He is currently not taking oral iron supplements. -Denies any bleeding, bright red blood per rectum, melena or hematochezia.  Has occasional epistaxis. -Labs from 11/01/2023 show hemoglobin of 17.3, hematocrit 52.2, MCV 87.6, TIBC 452, iron saturation 31% and ferritin of 138. -No additional IV iron needed at this time. -Given stability of labs over the past year, patient can return to PCP for follow-up in the future.  Follow Up Instructions: No follow-up needed in our clinic.    I discussed the assessment and treatment plan with the patient. The patient was provided an opportunity to ask questions and all were answered. The patient agreed with the plan and demonstrated an understanding of the instructions.   The patient was advised to call back or seek an in-person evaluation if the symptoms worsen or if the condition fails to improve as anticipated.  I provided 20 minutes of non-face-to-face time during this encounter.   Mauro Kaufmann, NP

## 2023-11-08 ENCOUNTER — Ambulatory Visit: Payer: PPO | Admitting: Oncology

## 2023-11-12 DIAGNOSIS — E1165 Type 2 diabetes mellitus with hyperglycemia: Secondary | ICD-10-CM | POA: Diagnosis not present

## 2023-11-12 LAB — LAB REPORT - SCANNED
A1c: 7.7
Albumin, Urine POC: 8.7
Creatinine, POC: 113.6 mg/dL
EGFR: 84

## 2023-11-14 DIAGNOSIS — M79671 Pain in right foot: Secondary | ICD-10-CM | POA: Diagnosis not present

## 2023-11-14 DIAGNOSIS — E1142 Type 2 diabetes mellitus with diabetic polyneuropathy: Secondary | ICD-10-CM | POA: Diagnosis not present

## 2023-11-14 DIAGNOSIS — M79672 Pain in left foot: Secondary | ICD-10-CM | POA: Diagnosis not present

## 2023-11-19 DIAGNOSIS — E1165 Type 2 diabetes mellitus with hyperglycemia: Secondary | ICD-10-CM | POA: Diagnosis not present

## 2023-11-19 DIAGNOSIS — I2510 Atherosclerotic heart disease of native coronary artery without angina pectoris: Secondary | ICD-10-CM | POA: Diagnosis not present

## 2023-12-02 ENCOUNTER — Ambulatory Visit: Admitting: Orthopedic Surgery

## 2023-12-02 ENCOUNTER — Encounter: Payer: Self-pay | Admitting: Orthopedic Surgery

## 2023-12-02 VITALS — BP 89/63 | HR 98 | Ht 74.0 in | Wt 248.0 lb

## 2023-12-02 DIAGNOSIS — M5416 Radiculopathy, lumbar region: Secondary | ICD-10-CM | POA: Diagnosis not present

## 2023-12-02 MED ORDER — PREDNISONE 10 MG (48) PO TBPK: ORAL_TABLET | Freq: Every day | ORAL | 0 refills | Status: DC

## 2023-12-02 NOTE — Progress Notes (Signed)
  Intake history:  BP (!) 89/63   Pulse 98   Ht 6\' 2"  (1.88 m)   Wt 248 lb (112.5 kg)   BMI 31.84 kg/m  Body mass index is 31.84 kg/m.    WHAT ARE WE SEEING YOU FOR TODAY?   right hip(s)  How long has this bothered you? (DOI?DOS?WS?)  on 3 weeks   Anticoag.  No  Diabetes Yes  Heart disease Angiographically nonobstructive 30 to 40% stenosis proximal to the LAD stent and 30 to 40% stenosis distal to the LAD stent.   Hypertension Yes  SMOKING HX No  Kidney disease No  Any ALLERGIES ______________________________________________   Treatment:  Have you taken:  Tylenol No  Advil No  Had PT No  Had injection No  Other  _______________Hydrocodone __________

## 2023-12-02 NOTE — Progress Notes (Signed)
 Subjective:     Patient ID: Jared Griffin, male   DOB: [birth year], 62 y.o.   MRN: [id number]  Hip Pain  The pain is present in the right hip, right thigh and right leg. The quality of the pain is described as shooting and aching. The pain is moderate. The pain has been Constant since onset. Pertinent negatives include no inability to bear weight, loss of motion, loss of sensation, muscle weakness, numbness or tingling. The symptoms are aggravated by movement. He has tried nothing for the symptoms.   Jared Griffin is 63 years old he has a history of lumbar disc disease he gets shots intermittently from Dr. Alvester Morin presents with right lateral hip pain that the pain goes down the leg just below the knee  Review of Systems  Neurological:  Negative for tingling and numbness.       Objective:   Physical Exam Vitals and nursing note reviewed.  Constitutional:      Appearance: Normal appearance.  HENT:     Head: Normocephalic and atraumatic.  Eyes:     General: No scleral icterus.       Right eye: No discharge.        Left eye: No discharge.     Extraocular Movements: Extraocular movements intact.     Conjunctiva/sclera: Conjunctivae normal.     Pupils: Pupils are equal, round, and reactive to light.  Cardiovascular:     Rate and Rhythm: Normal rate.     Pulses: Normal pulses.  Musculoskeletal:     Lumbar back: Tenderness present. Negative right straight leg raise test and negative left straight leg raise test. No scoliosis.       Back:     Right hip: Normal.     Left hip: Normal.     Comments: Right lower back tenderness right buttock tenderness right lateral leg and greater trochanteric tenderness  Skin:    General: Skin is warm and dry.     Capillary Refill: Capillary refill takes less than 2 seconds.  Neurological:     General: No focal deficit present.     Mental Status: He is alert and oriented to person, place, and time.     Sensory: No sensory deficit.     Motor: No weakness.      Deep Tendon Reflexes: Reflexes normal.  Psychiatric:        Mood and Affect: Mood normal.        Behavior: Behavior normal.        Thought Content: Thought content normal.        Judgment: Judgment normal.        Assessment:     Recurrent right leg radiculopathy last MRI was Feb 03, 2023 most of the pathology seem to be left-sided  I have seen him in the past for similar symptoms responded well to gabapentin and prednisone he is already on the gabapentin    Plan:     Prednisone  Recheck 3 weeks  No improvement repeat epidural injections  Meds ordered this encounter  Medications   DISCONTD: predniSONE (STERAPRED UNI-PAK 48 TAB) 10 MG (48) TBPK tablet    Sig: Take by mouth daily.    Dispense:  48 tablet    Refill:  0   predniSONE (STERAPRED UNI-PAK 48 TAB) 10 MG (48) TBPK tablet    Sig: Take by mouth daily. 10 mg ds 12 days    Dispense:  48 tablet    Refill:  0

## 2023-12-05 ENCOUNTER — Telehealth: Payer: Self-pay | Admitting: Orthopedic Surgery

## 2023-12-05 NOTE — Telephone Encounter (Signed)
 DR. Inge Rise called left voicemail wants Dr. Romeo Apple nurse to call him back he has a question about his medicine.

## 2023-12-06 ENCOUNTER — Other Ambulatory Visit: Payer: Self-pay | Admitting: Orthopedic Surgery

## 2023-12-06 MED ORDER — IBUPROFEN 800 MG PO TABS: 800.0000 mg | ORAL_TABLET | Freq: Three times a day (TID) | ORAL | 1 refills | Status: DC | PRN

## 2023-12-06 NOTE — Telephone Encounter (Signed)
 He is taking prednisone blood glucose is over 400 on the prednisone I told him to d/c it and we can send something else  [facility] is where he goes

## 2023-12-12 DIAGNOSIS — M79672 Pain in left foot: Secondary | ICD-10-CM | POA: Diagnosis not present

## 2023-12-12 DIAGNOSIS — E1142 Type 2 diabetes mellitus with diabetic polyneuropathy: Secondary | ICD-10-CM | POA: Diagnosis not present

## 2023-12-12 DIAGNOSIS — M79671 Pain in right foot: Secondary | ICD-10-CM | POA: Diagnosis not present

## 2023-12-12 IMAGING — MR MR CERVICAL SPINE W/O CM
4 of 5 series · 28 of 48 positions shown · non-contrast
Comparison: MRI cervical spine dated May 26, 2020.

CLINICAL DATA: Chronic severe neck and bilateral arm pain. No
injury or prior surgery.

EXAM:
MRI CERVICAL SPINE WITHOUT CONTRAST
TECHNIQUE: Multiplanar, multisequence MR imaging of the cervical spine was
performed. No intravenous contrast was administered.

[Series 2: T2 · sagittal · 3.0mm · 0.66mm/px · 6 of 15 slices shown (1 of 2)]
[im 1/15]
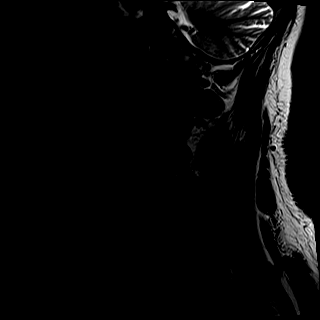
[im 3/15]
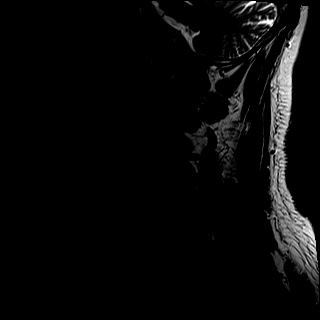
[im 6/15]
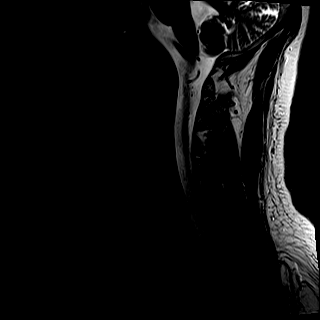
[im 9/15]
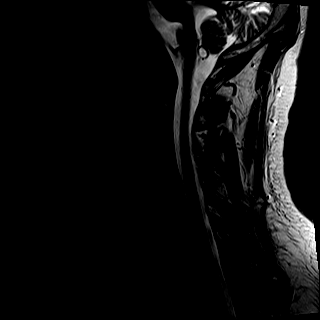
[im 12/15]
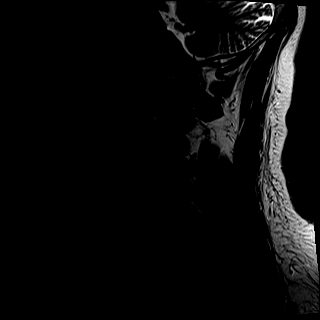
[im 15/15]
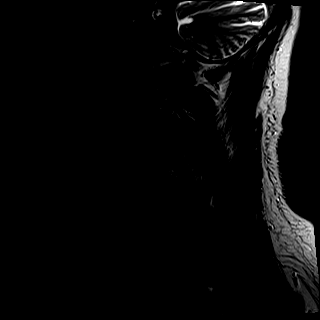

[Series 3: T1 · sagittal · 3.0mm · 0.41mm/px · 7 of 15 slices shown]
[im 1/15]
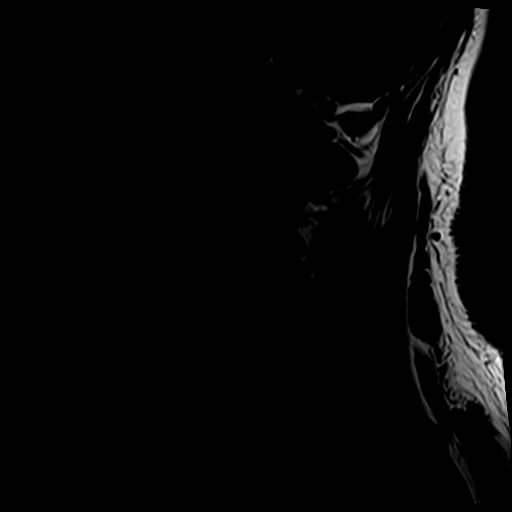
[im 3/15]
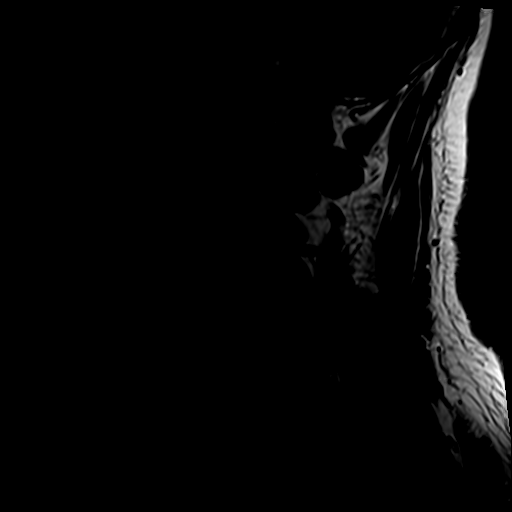
[im 5/15]
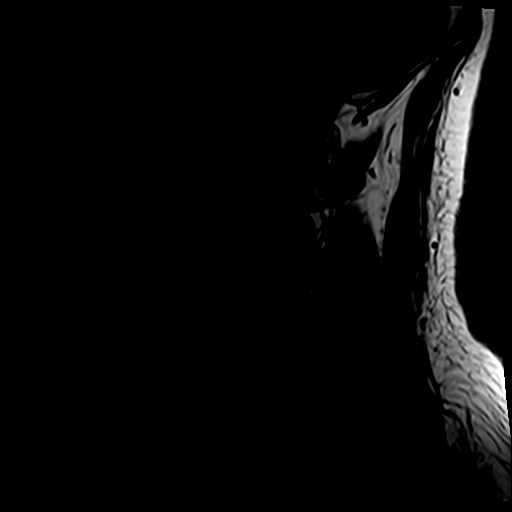
[im 8/15]
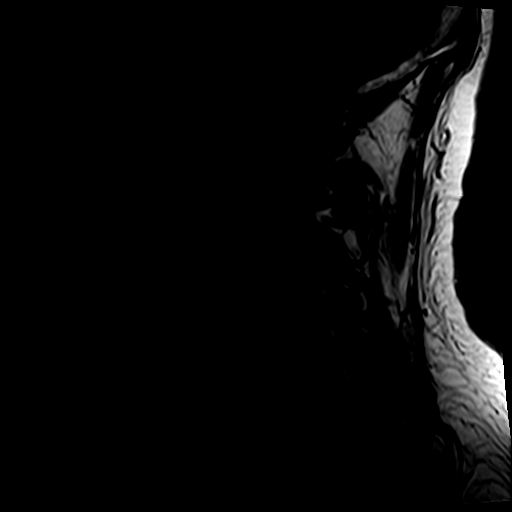
[im 10/15]
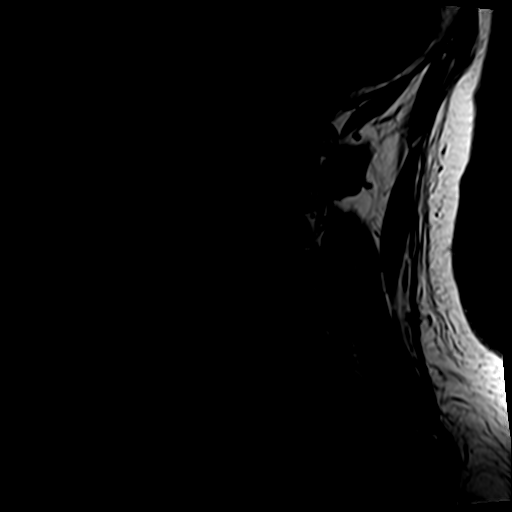
[im 12/15]
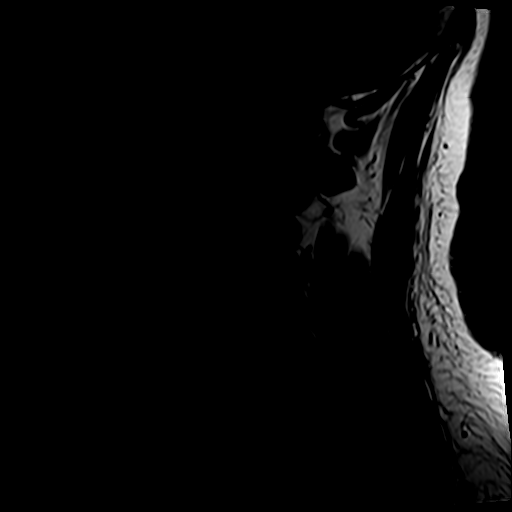
[im 15/15]
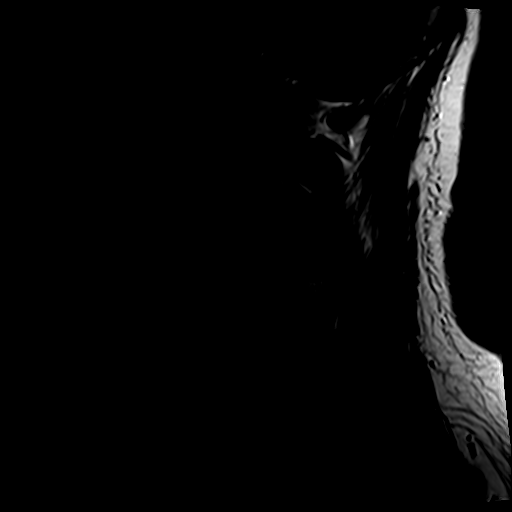

[Series 4: tir sag · sagittal · 3.0mm · 0.41mm/px · 7 of 15 slices shown]
[im 1/15]
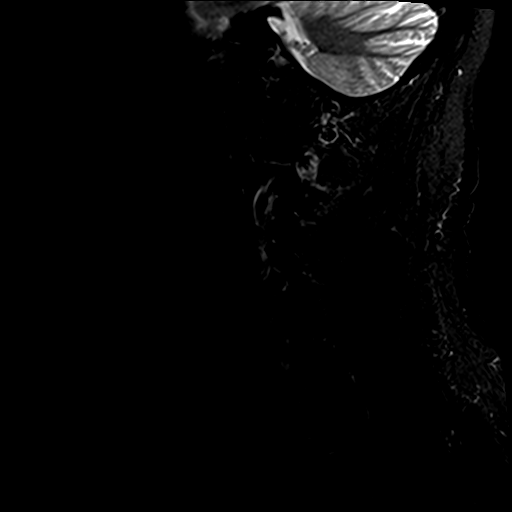
[im 3/15]
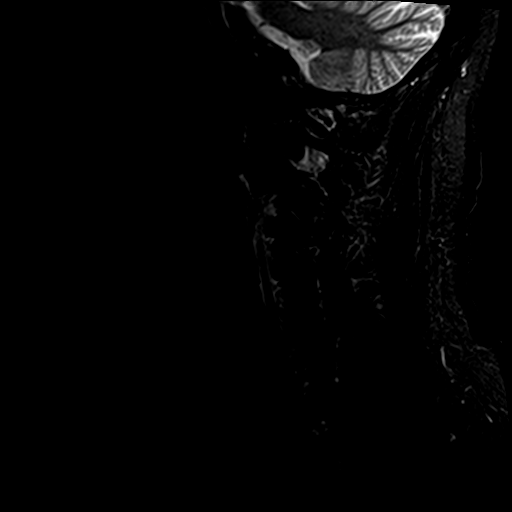
[im 5/15]
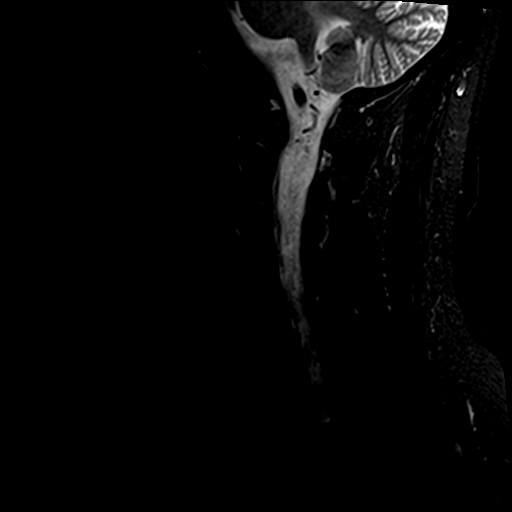
[im 8/15]
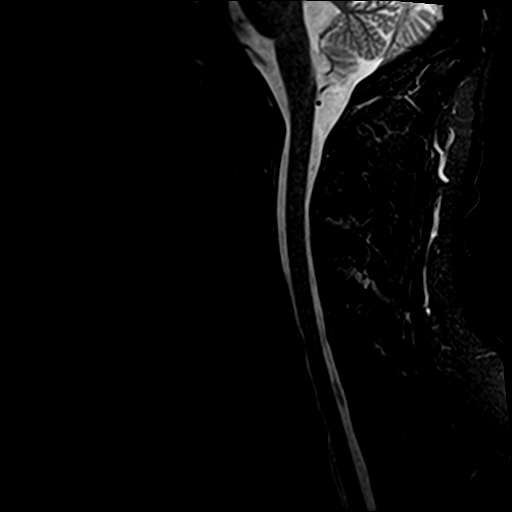
[im 10/15]
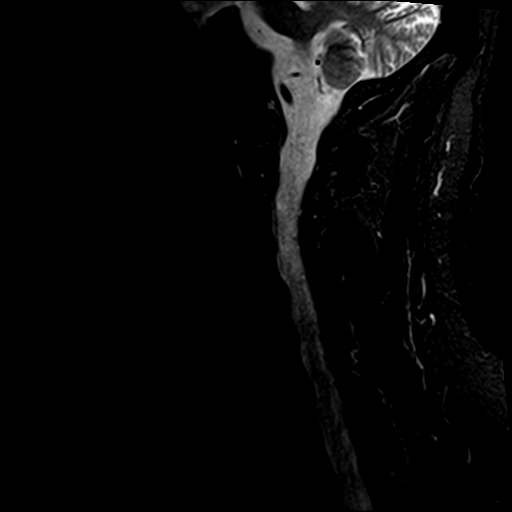
[im 12/15]
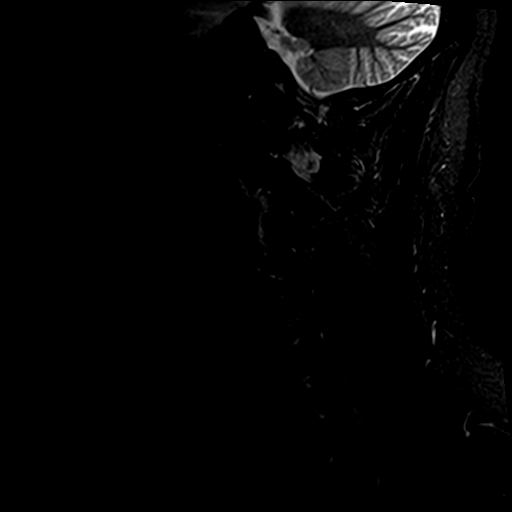
[im 15/15]
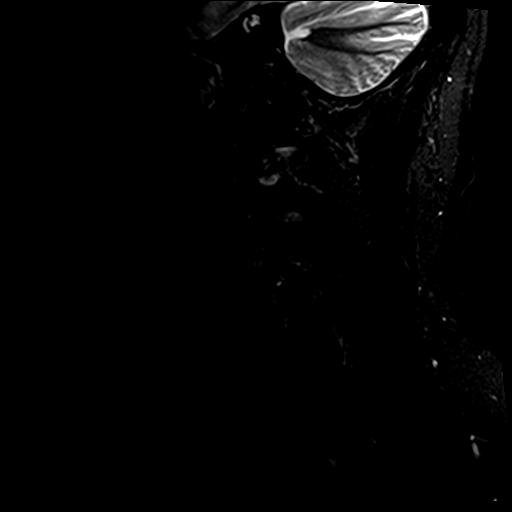

[Series 6: T2 · axial · 3.0mm · 0.70mm/px · z∈[-43,+72]mm · 8 of 32 slices shown (2 of 2)]
[im 1/32]
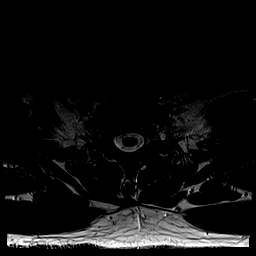
[im 5/32]
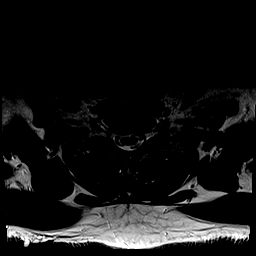
[im 10/32]
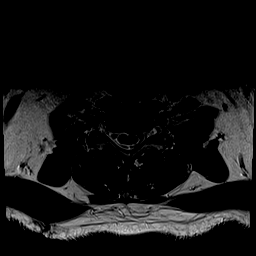
[im 15/32]
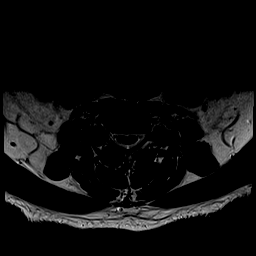
[im 17/32]
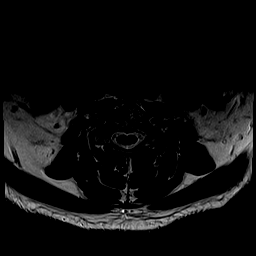
[im 22/32]
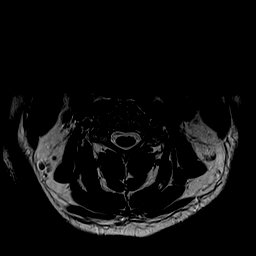
[im 27/32]
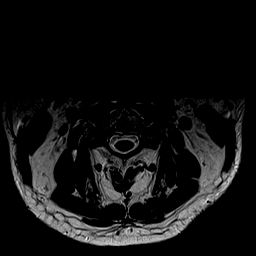
[im 32/32]
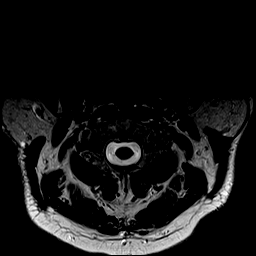

[28 of 48 positions shown; findings below may reference images not displayed]

FINDINGS: Alignment: Straightening of the normal cervical lordosis. No
listhesis.

Vertebrae: No fracture, evidence of discitis, or bone lesion.

Cord: Normal signal and morphology.

Posterior Fossa, vertebral arteries, paraspinal tissues: Negative.

Disc levels:

C2-C3:  Negative.

C3-C4:  Negative.

C4-C5: Unchanged tiny shallow central disc protrusion and advanced
left uncovertebral hypertrophy. Unchanged moderate left
neuroforaminal stenosis. No spinal canal or right neuroforaminal
stenosis.

C5-C6: Unchanged small right paracentral disc protrusion contacting
and slightly flattening the right ventral cord. Unchanged mild
bilateral uncovertebral hypertrophy. No stenosis.

C6-C7: Unchanged small left paracentral disc protrusion contacting
and flattening the left ventral cord. Unchanged mild bilateral
uncovertebral hypertrophy. No stenosis.

C7-T1: Unchanged small right paracentral disc protrusion with slight
flattening of the right ventral cord. No stenosis.
IMPRESSION: 1. Unchanged small disc protrusions from C5-C6 to C7-T1 contacting
and flattening the ventral cord, without significant stenosis.
2. Unchanged moderate left neuroforaminal stenosis at C4-C5 due to
uncovertebral hypertrophy.

## 2023-12-18 ENCOUNTER — Encounter: Payer: Self-pay | Admitting: Family Medicine

## 2023-12-18 ENCOUNTER — Ambulatory Visit: Payer: PPO | Admitting: Family Medicine

## 2023-12-18 VITALS — BP 99/64 | HR 77 | Temp 97.3°F | Ht 74.0 in | Wt 251.0 lb

## 2023-12-18 DIAGNOSIS — I25119 Atherosclerotic heart disease of native coronary artery with unspecified angina pectoris: Secondary | ICD-10-CM

## 2023-12-18 DIAGNOSIS — M545 Low back pain, unspecified: Secondary | ICD-10-CM | POA: Diagnosis not present

## 2023-12-18 DIAGNOSIS — E785 Hyperlipidemia, unspecified: Secondary | ICD-10-CM | POA: Diagnosis not present

## 2023-12-18 MED ORDER — ATORVASTATIN CALCIUM 40 MG PO TABS: 40.0000 mg | ORAL_TABLET | Freq: Every day | ORAL | 1 refills | Status: DC

## 2023-12-18 MED ORDER — ONDANSETRON 8 MG PO TBDP: 8.0000 mg | ORAL_TABLET | Freq: Three times a day (TID) | ORAL | 5 refills | Status: DC | PRN

## 2023-12-18 MED ORDER — AMLODIPINE BESYLATE 5 MG PO TABS: 5.0000 mg | ORAL_TABLET | Freq: Every day | ORAL | 1 refills | Status: DC

## 2023-12-18 NOTE — Progress Notes (Signed)
   Subjective:    Patient ID: Jared Griffin, male    DOB: 06-17-1961, 63 y.o.   MRN: [id number]  HPI  6 month follow up HTN and hyperlipidemia  Patient for blood pressure check up.  The patient does have hypertension.   Patient relates dietary measures try to minimize salt The importance of healthy diet and activity were discussed Patient relates compliance  Patient here for follow-up regarding cholesterol.    Patient relates taking medication on a regular basis Denies problems with medication Importance of dietary measures discussed Regular lab work regarding lipid and liver was checked and if needing additional labs was appropriately ordered  Review of Systems     Objective:   Physical Exam  General-in no acute distress Eyes-no discharge Lungs-respiratory rate normal, CTA CV-no murmurs,RRR Extremities skin warm dry no edema Neuro grossly normal Behavior normal, alert       Assessment & Plan:  1. Hyperlipidemia, unspecified hyperlipidemia type (Primary) Continue current measures  2. Coronary artery disease involving native coronary artery of native heart with angina pectoris [facility]) Patient relates shortness of breath with activity but no chest pressure tightness follows closely with cardiology he plans on starting him very gentle exercise with local gym patient was told if he starts having anginal-like symptoms he should follow-up with cardiology or notify us  3. Lumbar pain Currently gabapentin is helping if it gets worse we can increase the frequency of the gabapentin No lab work indicated currently Follow-up in 6 months lab work at that time  Patient will follow-up in 6 months We will order labs at that time We have requested labs from his endocrinologist to review as well

## 2023-12-23 ENCOUNTER — Ambulatory Visit: Admitting: Orthopedic Surgery

## 2023-12-23 ENCOUNTER — Encounter: Payer: Self-pay | Admitting: Orthopedic Surgery

## 2023-12-23 ENCOUNTER — Other Ambulatory Visit (INDEPENDENT_AMBULATORY_CARE_PROVIDER_SITE_OTHER): Payer: Self-pay | Admitting: Gastroenterology

## 2023-12-23 ENCOUNTER — Encounter (INDEPENDENT_AMBULATORY_CARE_PROVIDER_SITE_OTHER): Payer: Self-pay | Admitting: Gastroenterology

## 2023-12-23 ENCOUNTER — Ambulatory Visit (INDEPENDENT_AMBULATORY_CARE_PROVIDER_SITE_OTHER): Payer: PPO | Admitting: Gastroenterology

## 2023-12-23 ENCOUNTER — Encounter: Payer: Self-pay | Admitting: Hematology

## 2023-12-23 VITALS — BP 113/76 | HR 84 | Temp 97.1°F | Ht 74.0 in | Wt 250.0 lb

## 2023-12-23 DIAGNOSIS — M5416 Radiculopathy, lumbar region: Secondary | ICD-10-CM

## 2023-12-23 DIAGNOSIS — K7581 Nonalcoholic steatohepatitis (NASH): Secondary | ICD-10-CM | POA: Diagnosis not present

## 2023-12-23 DIAGNOSIS — K580 Irritable bowel syndrome with diarrhea: Secondary | ICD-10-CM | POA: Diagnosis not present

## 2023-12-23 NOTE — Progress Notes (Addendum)
 Chief Complaint  Patient presents with   Back Pain   Encounter Diagnosis  Name Primary?   Lumbar radiculopathy Yes    Hip Pain  The pain is present in the right hip, right thigh and right leg. The quality of the pain is described as shooting and aching. The pain is moderate. The pain has been Constant since onset. Pertinent negatives include no inability to bear weight, loss of motion, loss of sensation, muscle weakness, numbness or tingling. The symptoms are aggravated by movement. He has tried nothing for the symptoms.    Jared Griffin is 63 years old he has a history of lumbar disc disease he gets shots intermittently from Dr. Alvester Morin presents with right lateral hip pain that the pain goes down the leg just below the knee   Review of Systems  Neurological:  Negative for tingling and numbness.  T12-L1: Normal disc signal and height is present. No focal protrusion or stenosis is present.   L1-2: Far left annular tear is new since the prior study. No compressive stenosis is present.   L2-3: A far left lateral disc protrusion is new since the prior study. No focal stenosis is present.   L4-5: Disc desiccation and a leftward disc protrusion is present. This has progressed some. Mild left subarticular and foraminal stenosis has progressed. The right foramen is patent.   L5-S1: Normal disc signal and height is present. No focal protrusion or stenosis is present.   IMPRESSION: 1. Progressive leftward disc protrusion at L4-5 with mild left subarticular and foraminal stenosis. 2. New far left lateral disc protrusion at L2-3 without focal stenosis. 3. Far left annular tear at L1-2 is new since the prior study. No compressive stenosis is present.     Electronically Signed   By: Marin Roberts M.D.   On: 02/03/2023 09:50     Assessment and plan Jared Griffin is not improving after prednisone which he cannot tolerate and then gabapentin.  Ibuprofen is used sparingly because of GI  issues  Symptoms are usually lower back now radiating down the right leg perhaps L4-5  Through shared decision making we decided to get epidural injection.  He is familiar with Dr. Alvester Morin and will continue there for treatment  Chronic back pain with worsening right sided symptoms history of sciatica, complication from prednisone glucose was elevated. Epidural injection

## 2023-12-23 NOTE — Patient Instructions (Addendum)
 Repeat colonoscopy in July 2025 Continue intake of colestipol as needed for breakthrough episodes of diarrhea Focus on Mediterranean diet and intake of protein over other dietary groups Perform blood workup

## 2023-12-23 NOTE — Progress Notes (Signed)
 Katrinka Blazing, M.D. Gastroenterology & Hepatology [facility] Gastroenterology [address] [place], [place] [address]  Primary Care Physician: Babs Sciara, MD [address] [place] [address]  I will communicate my assessment and recommendations to the referring MD via EMR.  Problems: NASH IBS-D History of E. Coli O157 - resolved Bydureon and metformin intolerance   History of Present Illness: Jared Griffin is a 63 y.o. male with past medical history COPD, CAD s/p stent, diabetes c/b neuropathy , HLD, NASH and IBS-D,  who presents for follow up of abdominal pain, distention and diarrhea.   The patient was last seen on 12/17/2022. At that time, the patient was advised to continue Mediterranean diet and take colestipol as needed if presenting with diarrhea.  CBC and CMP were checked which were within normal limits.  Most recent CBC from 11/01/2023 showed a hemoglobin of 17.3, WBC 7.4, platelets 170, ferritin 138, TIBC 452, iron 142 and saturation of 31%.  Patient reports that very occasionally he has an episode of diarrhea, for which he takes one pill of colestipol.  States that he had significant issues with bloating and abdominal discomfort while on metformin. He stopped it 7 months ago and has felt much better since then. He is now on [facility] and has lost 25 lb for the last months.  The patient denies having any nausea, vomiting, fever, chills, hematochezia, melena, hematemesis, abdominal distention, abdominal pain, diarrhea, jaundice, pruritus or weight loss.  Last EGD: 04/04/2021, normal esophagus with healed pill esophageal ulcer, multiple gastric polyps, normal duodenum. Last Colonoscopies:  11/08/2020 which showed a total of 5 subcentimeter polyps in the colon, 15 mm polyp in the mid transverse colon which was resected in a piecemeal fashion, a 20 mm lipoma hemorrhoids.  Random colonic biopsies were negative for microscopic  colitis.  All the polyps were tubular adenomas.   Repeat colonoscopy in 04/04/2021  - Preparation of the colon was inadequate. - Hemorrhoids found on perianal exam. - The examined portion of the ileum was normal. - One 3 mm polyp at the ileocecal valve, removed with a cold snare. Complete resection. Polyp tissue not retrieved. - Medium-sized lipoma in the transverse colon. - The exam was otherwise normal throughout the examined colon. No presence of inflammation in the colon throughout. - Stool in the entire examined colon. - Non-bleeding external internal hemorrhoids.   Recommended repeat colonoscopy in 3 years - Needs 2 day prep  Past Medical History: Past Medical History:  Diagnosis Date   Anxiety    COPD (chronic obstructive pulmonary disease) (HCC)    Coronary atherosclerosis of native coronary artery    a. 12/09/2013: s/p PCI with 3.0 x 28 Promus DES to mLAD   Depression    Diabetic neuropathy (HCC)    Elbow injury 07/25/2017   Surgery for torn tendon   Essential hypertension    Fatty liver disease, nonalcoholic    GERD (gastroesophageal reflux disease)    Iron deficiency anemia 08/15/2021   Lateral epicondylitis of right elbow    Mixed hyperlipidemia    Obesity    Osgood-Schlatter's disease of right knee    Skin cyst 10/03/2017   Removed from back   Type 2 diabetes mellitus (HCC)     Past Surgical History: Past Surgical History:  Procedure Laterality Date   BIOPSY  01/21/2019   Procedure: BIOPSY;  Surgeon: Malissa Hippo, MD;  [facility];  Service: Endoscopy;;  gastric bx and gastric polyps  BIOPSY  11/08/2020   Procedure: BIOPSY;  Surgeon: Dolores Frame, MD;  [facility];  Service: Gastroenterology;;   CARDIAC CATHETERIZATION  2011   CARDIAC CATHETERIZATION N/A 09/20/2015   Procedure: Left Heart Cath and Coronary Angiography;  Surgeon: Kathleene Hazel, MD;  Location: [facility] INVASIVE CV LAB;  Service: Cardiovascular;   Laterality: N/A;   CHOLECYSTECTOMY  2003   COLONOSCOPY  06/19/2012   Procedure: COLONOSCOPY;  Surgeon: Malissa Hippo, MD;  [facility];  Service: Endoscopy;  Laterality: N/A;  930   COLONOSCOPY N/A 10/17/2017   Procedure: COLONOSCOPY;  Surgeon: Malissa Hippo, MD;  [facility];  Service: Endoscopy;  Laterality: N/A;  1225   COLONOSCOPY WITH PROPOFOL N/A 11/08/2020   Procedure: COLONOSCOPY WITH PROPOFOL;  Surgeon: Dolores Frame, MD;  [facility];  Service: Gastroenterology;  Laterality: N/A;  10:45   COLONOSCOPY WITH PROPOFOL N/A 04/04/2021   Procedure: COLONOSCOPY WITH PROPOFOL;  Surgeon: Dolores Frame, MD;  [facility];  Service: Gastroenterology;  Laterality: N/A;   CORONARY PRESSURE/FFR STUDY N/A 04/10/2022   Procedure: INTRAVASCULAR PRESSURE WIRE/FFR STUDY;  Surgeon: Lyn Records, MD;  Location: [facility] INVASIVE CV LAB;  Service: Cardiovascular;  Laterality: N/A;   CYST EXCISION  07/2019   ESOPHAGOGASTRODUODENOSCOPY (EGD) WITH PROPOFOL N/A 01/21/2019   Procedure: ESOPHAGOGASTRODUODENOSCOPY (EGD) WITH PROPOFOL;  Surgeon: Malissa Hippo, MD;  [facility];  Service: Endoscopy;  Laterality: N/A;  10:15am   ESOPHAGOGASTRODUODENOSCOPY (EGD) WITH PROPOFOL N/A 02/17/2021   Procedure: ESOPHAGOGASTRODUODENOSCOPY (EGD) WITH PROPOFOL;  Surgeon: Dolores Frame, MD;  [facility];  Service: Gastroenterology;  Laterality: N/A;  10:20   ESOPHAGOGASTRODUODENOSCOPY (EGD) WITH PROPOFOL N/A 04/04/2021   Procedure: ESOPHAGOGASTRODUODENOSCOPY (EGD) WITH PROPOFOL;  Surgeon: Dolores Frame, MD;  [facility];  Service: Gastroenterology;  Laterality: N/A;  12:30   GIVENS CAPSULE STUDY N/A 09/13/2021   Procedure: GIVENS CAPSULE STUDY;  Surgeon: Dolores Frame, MD;  [facility];  Service: Gastroenterology;  Laterality: N/A;  7:30   KNEE ARTHROPLASTY Right 1999   LEFT HEART  CATH AND CORONARY ANGIOGRAPHY N/A 02/26/2018   Procedure: LEFT HEART CATH AND CORONARY ANGIOGRAPHY;  Surgeon: Corky Crafts, MD;  Location: [facility] INVASIVE CV LAB;  Service: Cardiovascular;  Laterality: N/A;   LEFT HEART CATH AND CORONARY ANGIOGRAPHY N/A 04/10/2022   Procedure: LEFT HEART CATH AND CORONARY ANGIOGRAPHY;  Surgeon: Lyn Records, MD;  Location: [facility]VAS[facility] CV LAB;  Service: Cardiovascular;  Laterality: N/A;   LEFT HEART CATHETERIZATION WITH CORONARY ANGIOGRAM N/A 12/09/2013   Procedure: LEFT HEART CATHETERIZATION WITH CORONARY ANGIOGRAM;  Surgeon: Corky Crafts, MD;  Location: [facility] CATH LAB;  Service: Cardiovascular;  Laterality: N/A;   Liver biopsy     POLYPECTOMY  10/17/2017   Procedure: POLYPECTOMY;  Surgeon: Malissa Hippo, MD;  [facility];  Service: Endoscopy;;  ascending colon, splenic flexure,sigmoid x2   POLYPECTOMY  11/08/2020   Procedure: POLYPECTOMY;  Surgeon: Dolores Frame, MD;  [facility];  Service: Gastroenterology;;   POLYPECTOMY  04/04/2021   Procedure: POLYPECTOMY;  Surgeon: Dolores Frame, MD;  [facility];  Service: Gastroenterology;;   TENNIS ELBOW RELEASE/NIRSCHEL PROCEDURE Right 07/25/2017   Procedure: TENNIS ELBOW RELEASE and debriedment;  Surgeon: Vickki Hearing, MD;  Location: [facility];  Service: Orthopedics;  Laterality: Right;   TONSILLECTOMY  1970's    Family History: Family History  Problem  Relation Age of Onset   Osteoporosis Mother    Cancer Maternal Aunt    CVA Maternal Grandmother    CVA Maternal Grandfather    Healthy Son     Social History: Social History   Tobacco Use  Smoking Status Former   Current packs/day: 0.00   Average packs/day: 0.5 packs/day for 10.6 years (5.3 ttl pk-yrs)   Types: Cigarettes   Start date: 02/02/1973   Quit date: 09/17/1983   Years since quitting: 40.2  Smokeless Tobacco Former   Types: Chew   Quit date: 09/17/1983   Social History    Substance and Sexual Activity  Alcohol Use No   Alcohol/week: 0.0 standard drinks of alcohol   Social History   Substance and Sexual Activity  Drug Use No    Allergies: Allergies  Allergen Reactions   Bydureon [Exenatide] Diarrhea    Severe diarrhea and nausea   Ozempic (0.25 Or 0.5 Mg-Dose) [Semaglutide(0.25 Or 0.5mg -Dos)] Diarrhea    Severe nausea and diarrhea   Doxycycline     Severe esophagitis    Medications: Current Outpatient Medications  Medication Sig Dispense Refill   acetaminophen (TYLENOL) 500 MG tablet Take 1,500 mg by mouth daily as needed for moderate pain.     albuterol (VENTOLIN HFA) 108 (90 Base) MCG/ACT inhaler INHALE 2 PUFFS EVERY 6 HOURS (Patient taking differently: INHALE 2 PUFFS EVERY 6 HOURS prn) 8.5 g 5   ALPRAZolam (XANAX) 1 MG tablet Take 1 tablet (1 mg total) by mouth 3 (three) times daily as needed for anxiety. 90 tablet 3   amLODipine (NORVASC) 5 MG tablet Take 1 tablet (5 mg total) by mouth daily. 90 tablet 1   aspirin 81 MG EC tablet Take 1 tablet (81 mg total) by mouth daily. 30 tablet 12   atorvastatin (LIPITOR) 40 MG tablet Take 1 tablet (40 mg total) by mouth daily. 90 tablet 1   B-D UF III MINI PEN NEEDLES 31G X 5 MM MISC USE UP TO 4 TIMES DAILY WITH INSULIN AS DIRECTED. 100 each 3   colestipol (COLESTID) 1 g tablet Take 2 tablets (2 g total) by mouth daily. Take 4 hours apart from other medication (Patient taking differently: Take 1 g by mouth daily as needed (diarrhea). Take 4 hours apart from other medication) 180 tablet 3   Continuous Glucose Receiver (FREESTYLE LIBRE 3 READER) DEVI by Does not apply route.     Continuous Glucose Sensor (FREESTYLE LIBRE 3 PLUS SENSOR) MISC by Does not apply route. Change sensor every 15 days.     ezetimibe (ZETIA) 10 MG tablet Take 1 tablet (10 mg total) by mouth daily. 30 tablet 5   FARXIGA 10 MG TABS tablet Take 10 mg by mouth daily.     fluticasone (FLONASE) 50 MCG/ACT nasal spray Place 2 sprays into  both nostrils daily. 16 g 0   fluticasone-salmeterol (ADVAIR) 250-50 MCG/ACT AEPB Inhale 1 puff into the lungs in the morning and at bedtime. 1 each 5   gabapentin (NEURONTIN) 300 MG capsule TAKE 2 CAPSULES BY MOUTH EACH MORNING AND 2 CAPSULE EACH EVENING. 120 capsule 6   HUMALOG KWIKPEN 200 UNIT/ML KwikPen Inject 30-55 Units into the skin 3 (three) times daily before meals.     HYDROcodone-acetaminophen (NORCO/VICODIN) 5-325 MG tablet Take 1 tablet by mouth every 6 (six) hours as needed.     ibuprofen (ADVIL) 800 MG tablet Take 1 tablet (800 mg total) by mouth every 8 (eight) hours as needed. 90 tablet 1  isosorbide dinitrate (ISORDIL) 30 MG tablet Take 30 mg by mouth daily at 6 (six) AM.     Lancets MISC 1 each by Does not apply route 4 (four) times daily. 150 each 5   loratadine (CLARITIN) 10 MG tablet Take 10 mg by mouth daily as needed for allergies.     MOUNJARO 15 MG/0.5ML Pen Inject 15 mg into the skin once a week.     nitroGLYCERIN (NITROSTAT) 0.4 MG SL tablet Place 1 tablet (0.4 mg total) under the tongue every 5 (five) minutes as needed. 25 tablet 3   omeprazole (PRILOSEC) 40 MG capsule Take one daily 90 capsule 1   ondansetron (ZOFRAN-ODT) 8 MG disintegrating tablet Take 1 tablet (8 mg total) by mouth every 8 (eight) hours as needed for nausea or vomiting. 20 tablet 5   propranolol (INDERAL) 20 MG tablet TAKE ONE TABLET (20MG  TOTAL) BY MOUTH TWO TIMES DAILY 90 tablet 2   ranolazine (RANEXA) 1000 MG SR tablet TAKE ONE TABLET (1000MG  TOTAL) BY MOUTH TWO TIMES DAILY 180 tablet 2   sertraline (ZOLOFT) 100 MG tablet Take 1.5 tablets (150 mg total) by mouth daily. 135 tablet 3   TOUJEO MAX SOLOSTAR 300 UNIT/ML Solostar Pen INJECT 130 UNITS INTO THE SKIN AT BEDTIME. (Patient taking differently: Inject 35-55 Units into the skin at bedtime.) 12 mL 0   traMADol (ULTRAM) 50 MG tablet Take 1 tablet (50 mg total) by mouth every 8 (eight) hours as needed for moderate pain or severe pain. 20 tablet 0    No current facility-administered medications for this visit.    Review of Systems: GENERAL: negative for malaise, night sweats HEENT: No changes in hearing or vision, no nose bleeds or other nasal problems. NECK: Negative for lumps, goiter, pain and significant neck swelling RESPIRATORY: Negative for cough, wheezing CARDIOVASCULAR: Negative for chest pain, leg swelling, palpitations, orthopnea GI: SEE HPI MUSCULOSKELETAL: Negative for joint pain or swelling, back pain, and muscle pain. SKIN: Negative for lesions, rash PSYCH: Negative for sleep disturbance, mood disorder and recent psychosocial stressors. HEMATOLOGY Negative for prolonged bleeding, bruising easily, and swollen nodes. ENDOCRINE: Negative for cold or heat intolerance, polyuria, polydipsia and goiter. NEURO: negative for tremor, gait imbalance, syncope and seizures. The remainder of the review of systems is noncontributory.   Physical Exam: BP 113/76 (BP Location: Left Arm, Patient Position: Sitting, Cuff Size: Large)   Pulse 84   Temp (!) 97.1 F (36.2 C) (Temporal)   Ht 6\' 2"  (1.88 m)   Wt 250 lb (113.4 kg)   BMI 32.10 kg/m  GENERAL: The patient is AO x3, in no acute distress. HEENT: Head is normocephalic and atraumatic. EOMI are intact. Mouth is well hydrated and without lesions. NECK: Supple. No masses LUNGS: Clear to auscultation. No presence of rhonchi/wheezing/rales. Adequate chest expansion HEART: RRR, normal s1 and s2. ABDOMEN: mildly tender diffusely, no guarding, no peritoneal signs, and nondistended. BS +. No masses. EXTREMITIES: Without any cyanosis, clubbing, rash, lesions or edema. NEUROLOGIC: AOx3, no focal motor deficit. SKIN: no jaundice, no rashes  Imaging/Labs: as above  I personally reviewed and interpreted the available labs, imaging and endoscopic files.  Impression and Plan: Jared Griffin is a 63 y.o. male with past medical history COPD, CAD s/p stent, diabetes c/b neuropathy ,  HLD, NASH and IBS-D,  who presents for follow up of abdominal pain, distention and diarrhea.  Patient has presented significant improvement of his gastrointestinal complaints after stopping some of his antidiabetic medications (exenatide and metformin),  which are well-known to cause gastrointestinal complaints.  He has presented significant resolution of his GI symptoms and is doing well.  Has presented some infrequent episodes of diarrhea, which he has managed with colestipol, which he can continue using for now.  Notably, he has achieved significant weight loss with the use of Mounjaro recently, which hopefully will help his liver function.  Will repeat a CMP for surveillance of his NASH.  He should continue with Mediterranean diet while focusing on high-protein intake.  -Repeat colonoscopy in July 2025 -Continue intake of colestipol as needed for breakthrough episodes of diarrhea -Focus on Mediterranean diet and intake of protein over other dietary groups -Check CMP  All questions were answered.      Katrinka Blazing, MD Gastroenterology and Hepatology [facility] Gastroenterology

## 2023-12-23 NOTE — Progress Notes (Signed)
   There were no vitals taken for this visit.  There is no height or weight on file to calculate BMI.  No chief complaint on file.   No diagnosis found.  DOI/DOS/ Date: ongoing   Unchanged pain with lying down

## 2023-12-23 NOTE — Patient Instructions (Signed)
 We are referring you to [facility] from [facility] address is [address] [place] The phone number is [phone]  The office will call you with an appointment Dr. Alvester Morin

## 2023-12-24 LAB — COMPREHENSIVE METABOLIC PANEL WITH GFR
ALT: 44 IU/L (ref 0–44)
AST: 21 IU/L (ref 0–40)
Albumin: 4.6 g/dL (ref 3.9–4.9)
Alkaline Phosphatase: 75 IU/L (ref 44–121)
BUN/Creatinine Ratio: 13 (ref 10–24)
BUN: 15 mg/dL (ref 8–27)
Bilirubin Total: 0.8 mg/dL (ref 0.0–1.2)
CO2: 22 mmol/L (ref 20–29)
Calcium: 9.5 mg/dL (ref 8.6–10.2)
Chloride: 99 mmol/L (ref 96–106)
Creatinine, Ser: 1.2 mg/dL (ref 0.76–1.27)
Globulin, Total: 2.4 g/dL (ref 1.5–4.5)
Glucose: 217 mg/dL — ABNORMAL HIGH (ref 70–99)
Potassium: 4.4 mmol/L (ref 3.5–5.2)
Sodium: 139 mmol/L (ref 134–144)
Total Protein: 7 g/dL (ref 6.0–8.5)
eGFR: 69 mL/min/{1.73_m2} (ref >59)

## 2023-12-24 LAB — SPECIMEN STATUS REPORT

## 2023-12-25 ENCOUNTER — Encounter (INDEPENDENT_AMBULATORY_CARE_PROVIDER_SITE_OTHER): Payer: Self-pay

## 2023-12-30 ENCOUNTER — Telehealth: Payer: Self-pay | Admitting: Family Medicine

## 2023-12-30 NOTE — Telephone Encounter (Signed)
 Nurses Patient had urine ACR on 02/26/2023 It was a normal result of 8 Please abstract

## 2023-12-31 ENCOUNTER — Encounter: Payer: Self-pay | Admitting: Family Medicine

## 2023-12-31 LAB — MICROALBUMIN, URINE

## 2024-01-07 ENCOUNTER — Ambulatory Visit: Admitting: Physical Medicine and Rehabilitation

## 2024-01-07 ENCOUNTER — Encounter: Payer: Self-pay | Admitting: Physical Medicine and Rehabilitation

## 2024-01-07 ENCOUNTER — Other Ambulatory Visit: Payer: Self-pay

## 2024-01-07 VITALS — BP 120/80 | HR 81

## 2024-01-07 DIAGNOSIS — M5116 Intervertebral disc disorders with radiculopathy, lumbar region: Secondary | ICD-10-CM

## 2024-01-07 DIAGNOSIS — M7061 Trochanteric bursitis, right hip: Secondary | ICD-10-CM | POA: Diagnosis not present

## 2024-01-07 DIAGNOSIS — M5416 Radiculopathy, lumbar region: Secondary | ICD-10-CM

## 2024-01-07 MED ORDER — MELOXICAM 15 MG PO TABS: 15.0000 mg | ORAL_TABLET | Freq: Every day | ORAL | 0 refills | Status: DC | PRN

## 2024-01-07 MED ORDER — METHYLPREDNISOLONE ACETATE 40 MG/ML IJ SUSP
40.0000 mg | Freq: Once | INTRAMUSCULAR | Status: AC
  Administered 2024-01-07: 40 mg

## 2024-01-07 NOTE — Progress Notes (Signed)
 Pain Scale   Average Pain 3 Patient advising that his lower back pain is causing his right hip pain, patient states when he is walking and standing for log periods of time his pain increases. Patient advising that the pain bothers him at night. Patient also states he does get some relief from sitting in his recliner.        +Driver, -BT, -Dye Allergies.

## 2024-01-07 NOTE — Patient Instructions (Signed)

## 2024-01-07 NOTE — Procedures (Signed)
 Lumbar Epidural Steroid Injection - Interlaminar Approach with Fluoroscopic Guidance  Patient: Jared Griffin      Date of Birth: Nov 05, 1960 MRN: [id number] PCP: Bennet Brasil, MD      Visit Date: 01/07/2024   Universal Protocol:     Consent Given By: the patient  Position: PRONE  Additional Comments: Vital signs were monitored before and after the procedure. Patient was prepped and draped in the usual sterile fashion. The correct patient, procedure, and site was verified.   Injection Procedure Details:   Procedure diagnoses: Lumbar radiculopathy [M54.16]   Meds Administered:  Meds ordered this encounter  Medications   methylPREDNISolone  acetate (DEPO-MEDROL ) injection 40 mg     Laterality: Right  Location/Site:  L4-5  Needle: 3.5 in., 20 ga. Tuohy  Needle Placement: Paramedian epidural  Findings:   -Comments: Excellent flow of contrast into the epidural space.  Procedure Details: Using a paramedian approach from the side mentioned above, the region overlying the inferior lamina was localized under fluoroscopic visualization and the soft tissues overlying this structure were infiltrated with 4 ml. of 1% Lidocaine  without Epinephrine . The Tuohy needle was inserted into the epidural space using a paramedian approach.   The epidural space was localized using loss of resistance along with counter oblique bi-planar fluoroscopic views.  After negative aspirate for air, blood, and CSF, a 2 ml. volume of Isovue -250 was injected into the epidural space and the flow of contrast was observed. Radiographs were obtained for documentation purposes.    The injectate was administered into the level noted above.   Additional Comments:  The patient tolerated the procedure well Dressing: 2 x 2 sterile gauze and Band-Aid    Post-procedure details: Patient was observed during the procedure. Post-procedure instructions were reviewed.  Patient left the clinic in stable  condition.

## 2024-01-07 NOTE — Progress Notes (Signed)
 Jared Griffin - 62 y.o. male MRN [id number]  Date of birth: 27-Dec-1960  Office Visit Note: Visit Date: 01/07/2024 PCP: Bennet Brasil, MD Referred by: Bennet Brasil, MD  Subjective: Chief Complaint  Patient presents with   Lower Back - Pain   HPI:  Jared Griffin is a 63 y.o. male who comes in today for evaluation and management at the request of Dr. Phyllis Breeze for his chronic low back and bilateral hip pain history.  He comes in today telling me that his pain seems different than in the past.  He has had epidural injection in the past at L3-4 for what is mild to moderate lateral recess narrowing some levels of small disc protrusion and annular tearing which is fairly mild.  In fact more disc related issues to the left than right but nothing compressive.  He says his pain is different that it is more right sided hip pain but he points to the greater trochanteric area.  No groin pain.  Does have pain laying on his right side pain with walking a distance and gets referral down to about the knee.  Nothing really past the knee.  No paresthesias.  He says it really does feel different to him than his normal lumbar radicular type pain that he has had in the past.  He has been managed by Dr. Phyllis Breeze with medication and time and therapy.  His case is complicated by insulin -dependent diabetes and anxiety disorder as well as chronic pain disorder.   I spent more than 30 minutes speaking face-to-face with the patient with 50% of the time in counseling and discussing coordination of care.    Review of Systems  Musculoskeletal:  Positive for back pain and joint pain.  All other systems reviewed and are negative.  Otherwise per HPI.  Assessment & Plan: Visit Diagnoses:    ICD-10-CM   1. Greater trochanteric bursitis, right  M70.61 Large Joint Inj: R greater trochanter    2. Lumbar radiculopathy  M54.16 XR C-ARM NO REPORT    methylPREDNISolone  acetate (DEPO-MEDROL ) injection 40 mg     CANCELED: Epidural Steroid injection    3. Radiculopathy due to lumbar intervertebral disc disorder  M51.16 XR C-ARM NO REPORT    methylPREDNISolone  acetate (DEPO-MEDROL ) injection 40 mg    CANCELED: Epidural Steroid injection      Plan: Findings:  Chronic worsening and severe right lateral hip pain with referral into the leg laterally.  Differential diagnosis is radicular pain or radiculitis from lumbar spine lateral recess narrowing.  MRI does not show any high-grade stenosis or nerve compression or disc herniation etc.  He has small disc related findings and some mild to moderate lateral recess narrowing.  Epidural injection in the past has helped but he feels like this is a different pain.  I think differential diagnosis is right greater trochanteric bursitis or pain syndrome.  After talking with him at great length we decided to complete diagnostic of therapeutic right greater trochanteric injection.  Depending on relief would look at epidural injection potentially.  Talked about general stretching and potential for seeing Dr. Vaughn Georges in our office for further management of trochanteric pain syndrome which could include shockwave treatment.    Meds & Orders:  Meds ordered this encounter  Medications   methylPREDNISolone  acetate (DEPO-MEDROL ) injection 40 mg   meloxicam  (MOBIC ) 15 MG tablet    Sig: Take 1 tablet (15 mg total) by mouth daily as needed for pain. Take with food  Dispense:  30 tablet    Refill:  0    Orders Placed This Encounter  Procedures   Large Joint Inj: R greater trochanter   XR C-ARM NO REPORT    Follow-up: Return for visit to requesting provider as needed.   Procedures: Large Joint Inj: R greater trochanter on 01/07/2024 1:24 PM Indications: pain and diagnostic evaluation Details: 22 G 3.5 in needle, fluoroscopy-guided lateral approach  Arthrogram: No  Medications: 4 mL lidocaine  2 %; 4 mL bupivacaine  0.25 %; 40 mg triamcinolone acetonide 40 MG/ML Outcome:  tolerated well, no immediate complications  There was excellent flow of contrast outlined the greater trochanteric bursa without vascular uptake. Procedure, treatment alternatives, risks and benefits explained, specific risks discussed. Consent was given by the patient. Immediately prior to procedure a time out was called to verify the correct patient, procedure, equipment, support staff and site/side marked as required. Patient was prepped and draped in the usual sterile fashion.      Lumbar Epidural Steroid Injection - Interlaminar Approach with Fluoroscopic Guidance  Patient: Jared Griffin      Date of Birth: Jul 25, 1961 MRN: [id number] PCP: Bennet Brasil, MD      Visit Date: 01/07/2024   Universal Protocol:     Consent Given By: the patient  Position: PRONE  Additional Comments: Vital signs were monitored before and after the procedure. Patient was prepped and draped in the usual sterile fashion. The correct patient, procedure, and site was verified.   Injection Procedure Details:   Procedure diagnoses: Lumbar radiculopathy [M54.16]   Meds Administered:  Meds ordered this encounter  Medications   methylPREDNISolone  acetate (DEPO-MEDROL ) injection 40 mg     Laterality: Right  Location/Site:  L4-5  Needle: 3.5 in., 20 ga. Tuohy  Needle Placement: Paramedian epidural  Findings:   -Comments: Excellent flow of contrast into the epidural space.  Procedure Details: Using a paramedian approach from the side mentioned above, the region overlying the inferior lamina was localized under fluoroscopic visualization and the soft tissues overlying this structure were infiltrated with 4 ml. of 1% Lidocaine  without Epinephrine . The Tuohy needle was inserted into the epidural space using a paramedian approach.   The epidural space was localized using loss of resistance along with counter oblique bi-planar fluoroscopic views.  After negative aspirate for air, blood, and CSF, a 2  ml. volume of Isovue -250 was injected into the epidural space and the flow of contrast was observed. Radiographs were obtained for documentation purposes.    The injectate was administered into the level noted above.   Additional Comments:  The patient tolerated the procedure well Dressing: 2 x 2 sterile gauze and Band-Aid    Post-procedure details: Patient was observed during the procedure. Post-procedure instructions were reviewed.  Patient left the clinic in stable condition.   Clinical History: CLINICAL DATA:  Low back pain, symptoms persist with greater than 6 weeks of treatment.   EXAM: MRI LUMBAR SPINE WITHOUT CONTRAST   TECHNIQUE: Multiplanar, multisequence MR imaging of the lumbar spine was performed. No intravenous contrast was administered.   COMPARISON:  MRI of the lumbar spine 10/10/19.   FINDINGS: Segmentation: 5 non rib-bearing lumbar type vertebral bodies are present. The lowest fully formed vertebral body is L5.   Alignment:  Slight retrolisthesis at L3-4 is stable.   Vertebrae: Prominent hemangioma at L3 is stable. Marrow signal and vertebral body heights are otherwise normal.   Conus medullaris and cauda equina: Conus extends to the L1 level. Conus and cauda  equina appear normal.   Paraspinal and other soft tissues: A 13 mm simple cyst is present in the right kidney. No other focal lesions are present. Recommend no imaging follow-up for this lesion.   Disc levels:   T12-L1: Normal disc signal and height is present. No focal protrusion or stenosis is present.   L1-2: Far left annular tear is new since the prior study. No compressive stenosis is present.   L2-3: A far left lateral disc protrusion is new since the prior study. No focal stenosis is present.   L4-5: Disc desiccation and a leftward disc protrusion is present. This has progressed some. Mild left subarticular and foraminal stenosis has progressed. The right foramen is patent.    L5-S1: Normal disc signal and height is present. No focal protrusion or stenosis is present.   IMPRESSION: 1. Progressive leftward disc protrusion at L4-5 with mild left subarticular and foraminal stenosis. 2. New far left lateral disc protrusion at L2-3 without focal stenosis. 3. Far left annular tear at L1-2 is new since the prior study. No compressive stenosis is present.     Electronically Signed   By: Audree Leas M.D.   On: 02/03/2023 09:50     Objective:  VS:  HT:    WT:   BMI:     BP:120/80  HR:81bpm  TEMP: ( )  RESP:  Physical Exam Vitals and nursing note reviewed.  Constitutional:      General: He is not in acute distress.    Appearance: Normal appearance. He is not ill-appearing.  HENT:     Head: Normocephalic and atraumatic.     Right Ear: External ear normal.     Left Ear: External ear normal.     Nose: No congestion.  Eyes:     Extraocular Movements: Extraocular movements intact.  Cardiovascular:     Rate and Rhythm: Normal rate.     Pulses: Normal pulses.  Pulmonary:     Effort: Pulmonary effort is normal. No respiratory distress.  Abdominal:     General: There is no distension.     Palpations: Abdomen is soft.  Musculoskeletal:        General: No tenderness or signs of injury.     Cervical back: Neck supple.     Right lower leg: No edema.     Left lower leg: No edema.     Comments: Patient has good distal strength without clonus.  He has concordant tenderness over the right greater trochanter and on the left.  No pain with hip rotation.  Skin:    Findings: No erythema or rash.  Neurological:     General: No focal deficit present.     Mental Status: He is alert and oriented to person, place, and time.     Sensory: No sensory deficit.     Motor: No weakness or abnormal muscle tone.     Coordination: Coordination normal.  Psychiatric:        Mood and Affect: Mood normal.        Behavior: Behavior normal.      Imaging: No results  found.

## 2024-01-13 MED ORDER — BUPIVACAINE HCL 0.25 % IJ SOLN
4.0000 mL | INTRAMUSCULAR | Status: AC | PRN
  Administered 2024-01-07: 4 mL via INTRA_ARTICULAR

## 2024-01-13 MED ORDER — LIDOCAINE HCL 2 % IJ SOLN
4.0000 mL | INTRAMUSCULAR | Status: AC | PRN
  Administered 2024-01-07: 4 mL

## 2024-01-13 MED ORDER — TRIAMCINOLONE ACETONIDE 40 MG/ML IJ SUSP
40.0000 mg | INTRAMUSCULAR | Status: AC | PRN
  Administered 2024-01-07: 40 mg via INTRA_ARTICULAR

## 2024-01-14 DIAGNOSIS — I2510 Atherosclerotic heart disease of native coronary artery without angina pectoris: Secondary | ICD-10-CM | POA: Diagnosis not present

## 2024-01-14 DIAGNOSIS — E1165 Type 2 diabetes mellitus with hyperglycemia: Secondary | ICD-10-CM | POA: Diagnosis not present

## 2024-01-23 DIAGNOSIS — M79671 Pain in right foot: Secondary | ICD-10-CM | POA: Diagnosis not present

## 2024-01-23 DIAGNOSIS — E1142 Type 2 diabetes mellitus with diabetic polyneuropathy: Secondary | ICD-10-CM | POA: Diagnosis not present

## 2024-01-23 DIAGNOSIS — M79672 Pain in left foot: Secondary | ICD-10-CM | POA: Diagnosis not present

## 2024-02-03 ENCOUNTER — Other Ambulatory Visit: Payer: Self-pay | Admitting: Cardiology

## 2024-02-03 ENCOUNTER — Other Ambulatory Visit: Payer: Self-pay | Admitting: Family Medicine

## 2024-02-03 DIAGNOSIS — R251 Tremor, unspecified: Secondary | ICD-10-CM

## 2024-02-03 DIAGNOSIS — M542 Cervicalgia: Secondary | ICD-10-CM

## 2024-02-06 ENCOUNTER — Other Ambulatory Visit: Payer: Self-pay | Admitting: Family Medicine

## 2024-02-11 DIAGNOSIS — E1165 Type 2 diabetes mellitus with hyperglycemia: Secondary | ICD-10-CM | POA: Diagnosis not present

## 2024-02-11 DIAGNOSIS — I2510 Atherosclerotic heart disease of native coronary artery without angina pectoris: Secondary | ICD-10-CM | POA: Diagnosis not present

## 2024-02-12 ENCOUNTER — Other Ambulatory Visit (INDEPENDENT_AMBULATORY_CARE_PROVIDER_SITE_OTHER): Payer: Self-pay | Admitting: Gastroenterology

## 2024-02-21 ENCOUNTER — Encounter (INDEPENDENT_AMBULATORY_CARE_PROVIDER_SITE_OTHER): Payer: Self-pay | Admitting: *Deleted

## 2024-02-25 ENCOUNTER — Other Ambulatory Visit (INDEPENDENT_AMBULATORY_CARE_PROVIDER_SITE_OTHER): Payer: Self-pay | Admitting: Gastroenterology

## 2024-02-25 DIAGNOSIS — K580 Irritable bowel syndrome with diarrhea: Secondary | ICD-10-CM

## 2024-02-25 DIAGNOSIS — K529 Noninfective gastroenteritis and colitis, unspecified: Secondary | ICD-10-CM

## 2024-02-27 ENCOUNTER — Ambulatory Visit: Payer: Self-pay

## 2024-02-27 ENCOUNTER — Ambulatory Visit: Admitting: Family Medicine

## 2024-02-27 VITALS — BP 86/56 | HR 77 | Temp 98.1°F | Ht 74.0 in | Wt 250.0 lb

## 2024-02-27 DIAGNOSIS — E785 Hyperlipidemia, unspecified: Secondary | ICD-10-CM

## 2024-02-27 DIAGNOSIS — R0609 Other forms of dyspnea: Secondary | ICD-10-CM | POA: Diagnosis not present

## 2024-02-27 DIAGNOSIS — K5900 Constipation, unspecified: Secondary | ICD-10-CM | POA: Diagnosis not present

## 2024-02-27 DIAGNOSIS — I25119 Atherosclerotic heart disease of native coronary artery with unspecified angina pectoris: Secondary | ICD-10-CM

## 2024-02-27 DIAGNOSIS — E1159 Type 2 diabetes mellitus with other circulatory complications: Secondary | ICD-10-CM | POA: Diagnosis not present

## 2024-02-27 DIAGNOSIS — Z79899 Other long term (current) drug therapy: Secondary | ICD-10-CM

## 2024-02-27 DIAGNOSIS — I951 Orthostatic hypotension: Secondary | ICD-10-CM

## 2024-02-27 NOTE — Telephone Encounter (Signed)
 Appt scheduled

## 2024-02-27 NOTE — Progress Notes (Signed)
   Subjective:    Patient ID: Jared Griffin, male    DOB: [birth year], 62 y.o.   MRN: [id number]  HPI Patient with significant fatigue tiredness feeling rundown finds himself feeling woozy drowsy and visually everything gets narrow around him he tries to stay hydrated he states his sugars have been running in a good range he relates himself feeling very fatigued to the point that he needed help going up steps because his legs gave out He is also under a lot of stress and financial stress with his relationship and thinking through some things Patient not suicidal    Review of Systems     Objective:   Physical Exam  General-in no acute distress Eyes-no discharge Lungs-respiratory rate normal, CTA CV-no murmurs,RRR Extremities skin warm dry no edema Neuro grossly normal Behavior normal, alert Patient is orthostatic Blood pressure is low 94/60 sitting 88/58 standing      Assessment & Plan:  1. Hyperlipidemia, unspecified hyperlipidemia type (Primary) Under decent control continue current measures  2. Coronary artery disease involving native coronary artery of native heart with angina pectoris [facility]) Denies any angina symptoms currently  3. DM type 2 causing vascular disease (HCC) Not having any low sugars except for 1 that occurred and his doctor treated him with glucagon for future use if necessary - CBC with Differential - Basic Metabolic Panel  4. Constipation, unspecified constipation type No blood in the bowel movements recommend stool softeners  5. Orthostatic hypotension Stop amlodipine , stop Farxiga , recheck blood pressure next week bring all medicines with him Labs ordered   6. DOE (dyspnea on exertion) Check lab work.  I do not find evidence of congestive heart failure currently - Brain natriuretic peptide  7. High risk medication use Labs ordered - Hepatic Function Panel  Patient to see significant improvement over the course the next 48 hours to 72  hours may have to adjust medications even more when he follows up next week we will keep his other doctors informed

## 2024-02-27 NOTE — Telephone Encounter (Signed)
 FYI Only or Action Required?: Action required by provider  Patient was last seen in primary care on 12/18/2023 by Bennet Brasil, MD. Called Nurse Triage reporting Dizziness. Symptoms began several years ago. Interventions attempted: Rest, hydration, or home remedies. Symptoms are: gradually worsening.  Triage Disposition: See HCP Within 4 Hours (Or PCP Triage)  Patient/caregiver understands and will follow disposition?: YesCopied from CRM [id number]. Topic: Clinical - Red Word Triage >> Feb 27, 2024  1:15 PM Everlene Hobby D wrote: Patient is having dizziness that has been going on awhile and has gotten worse in the last couple weeks. Reason for Disposition  [1] Dizziness caused by heat exposure, sudden standing, or poor fluid intake AND [2] no improvement after 2 hours of rest and fluids  Answer Assessment - Initial Assessment Questions 1. DESCRIPTION: Describe your dizziness.     Feels like I will pass out when I stand up 2. LIGHTHEADED: Do you feel lightheaded? (e.g., somewhat faint, woozy, weak upon standing)     woozy 3. VERTIGO: Do you feel like either you or the room is spinning or tilting? (i.e. vertigo)     Denies  4. SEVERITY: How bad is it?  Do you feel like you are going to faint? Can you stand and walk?   - MILD: Feels slightly dizzy, but walking normally.   - MODERATE: Feels unsteady when walking, but not falling; interferes with normal activities (e.g., school, work).   - SEVERE: Unable to walk without falling, or requires assistance to walk without falling; feels like passing out now.      Mild  5. ONSET:  When did the dizziness begin?     Years  6. AGGRAVATING FACTORS: Does anything make it worse? (e.g., standing, change in head position)     Stress, anxiety  7. HEART RATE: Can you tell me your heart rate? How many beats in 15 seconds?  (Note: not all patients can do this)       Not sure  8. CAUSE: What do you think is causing the dizziness?     Stress and  anxiety  9. RECURRENT SYMPTOM: Have you had dizziness before? If Yes, ask: When was the last time? What happened that time?     Daily  10. OTHER SYMPTOMS: Do you have any other symptoms? (e.g., fever, chest pain, vomiting, diarrhea, bleeding)       Denies      Pt feels about 2/3 time after going from sitting to standing he has to grab on to wall/object until his comes to. Pt has fallen but  was last year due to dehydration.  Protocols used: Dizziness - Lightheadedness-A-AH

## 2024-02-28 ENCOUNTER — Ambulatory Visit: Payer: Self-pay | Admitting: Family Medicine

## 2024-02-28 ENCOUNTER — Other Ambulatory Visit: Payer: Self-pay | Admitting: Family Medicine

## 2024-02-28 LAB — CBC WITH DIFFERENTIAL/PLATELET

## 2024-02-29 LAB — CBC WITH DIFFERENTIAL/PLATELET
Basos: 1 %
EOS (ABSOLUTE): 0.1 10*3/uL (ref 0.0–0.2)
Eos: 2 %
Hematocrit: 51.7 % — ABNORMAL HIGH (ref 37.5–51.0)
Hemoglobin: 17.2 g/dL (ref 13.0–17.7)
Immature Granulocytes: 0 %
Immature Granulocytes: 0 10*3/uL (ref 0.0–0.1)
Lymphs: 35 %
MCH: 30 pg (ref 26.6–33.0)
MCHC: 33.3 g/dL (ref 31.5–35.7)
MCV: 90 fL (ref 79–97)
Monocytes Absolute: 0.2 10*3/uL (ref 0.0–0.4)
Monocytes Absolute: 0.7 10*3/uL (ref 0.1–0.9)
Monocytes: 9 %
Neutrophils Absolute: 2.9 10*3/uL (ref 0.7–3.1)
Neutrophils Absolute: 4.4 10*3/uL (ref 1.4–7.0)
Neutrophils: 53 %
Platelets: 163 10*3/uL (ref 150–450)
RBC: 5.74 x10E6/uL (ref 4.14–5.80)
RDW: 13.8 % (ref 11.6–15.4)
WBC: 8.3 10*3/uL (ref 3.4–10.8)

## 2024-02-29 LAB — BASIC METABOLIC PANEL WITH GFR
BUN/Creatinine Ratio: 16 (ref 10–24)
BUN: 21 mg/dL (ref 8–27)
CO2: 20 mmol/L (ref 20–29)
Calcium: 9.7 mg/dL (ref 8.6–10.2)
Chloride: 102 mmol/L (ref 96–106)
Creatinine, Ser: 1.29 mg/dL — ABNORMAL HIGH (ref 0.76–1.27)
Glucose: 139 mg/dL — ABNORMAL HIGH (ref 70–99)
Potassium: 4.8 mmol/L (ref 3.5–5.2)
Sodium: 140 mmol/L (ref 134–144)
eGFR: 63 mL/min/{1.73_m2} (ref >59)

## 2024-02-29 LAB — HEPATIC FUNCTION PANEL
ALT: 53 IU/L — ABNORMAL HIGH (ref 0–44)
AST: 30 IU/L (ref 0–40)
Albumin: 4.9 g/dL (ref 3.9–4.9)
Alkaline Phosphatase: 74 IU/L (ref 44–121)
Bilirubin Total: 0.9 mg/dL (ref 0.0–1.2)
Bilirubin, Direct: 0.33 mg/dL (ref 0.00–0.40)
Total Protein: 7.5 g/dL (ref 6.0–8.5)

## 2024-02-29 LAB — BRAIN NATRIURETIC PEPTIDE: BNP: <2.5 pg/mL (ref 0.0–100.0)

## 2024-03-02 ENCOUNTER — Other Ambulatory Visit: Payer: Self-pay

## 2024-03-02 MED ORDER — GABAPENTIN 300 MG PO CAPS: ORAL_CAPSULE | ORAL | 6 refills | Status: DC

## 2024-03-03 DIAGNOSIS — M79671 Pain in right foot: Secondary | ICD-10-CM | POA: Diagnosis not present

## 2024-03-03 DIAGNOSIS — M79672 Pain in left foot: Secondary | ICD-10-CM | POA: Diagnosis not present

## 2024-03-03 DIAGNOSIS — E1142 Type 2 diabetes mellitus with diabetic polyneuropathy: Secondary | ICD-10-CM | POA: Diagnosis not present

## 2024-03-04 ENCOUNTER — Telehealth (INDEPENDENT_AMBULATORY_CARE_PROVIDER_SITE_OTHER): Admitting: Psychiatry

## 2024-03-04 ENCOUNTER — Encounter (HOSPITAL_COMMUNITY): Payer: Self-pay | Admitting: Psychiatry

## 2024-03-04 DIAGNOSIS — F411 Generalized anxiety disorder: Secondary | ICD-10-CM | POA: Diagnosis not present

## 2024-03-04 DIAGNOSIS — F321 Major depressive disorder, single episode, moderate: Secondary | ICD-10-CM | POA: Diagnosis not present

## 2024-03-04 MED ORDER — TRAZODONE HCL 50 MG PO TABS: 50.0000 mg | ORAL_TABLET | Freq: Every evening | ORAL | 3 refills | Status: AC | PRN

## 2024-03-04 MED ORDER — SERTRALINE HCL 100 MG PO TABS: 150.0000 mg | ORAL_TABLET | Freq: Every day | ORAL | 3 refills | Status: DC

## 2024-03-04 MED ORDER — ALPRAZOLAM 1 MG PO TABS: 1.0000 mg | ORAL_TABLET | Freq: Three times a day (TID) | ORAL | 3 refills | Status: DC | PRN

## 2024-03-04 NOTE — Progress Notes (Signed)
 Virtual Visit via Video Note  I connected with Jared Griffin on 03/04/24 at 11:00 AM EDT by a video enabled telemedicine application and verified that I am speaking with the correct person using two identifiers.  Location: Patient: home Provider: office   I discussed the limitations of evaluation and management by telemedicine and the availability of in person appointments. The patient expressed understanding and agreed to proceed.     I discussed the assessment and treatment plan with the patient. The patient was provided an opportunity to ask questions and all were answered. The patient agreed with the plan and demonstrated an understanding of the instructions.   The patient was advised to call back or seek an in-person evaluation if the symptoms worsen or if the condition fails to improve as anticipated.  I provided 20 minutes of non-face-to-face time during this encounter.   [name], MD  [facility] MD/PA/NP OP Progress Note  03/04/2024 11:04 AM [name]  MRN:  [id number]  Chief Complaint:  Chief Complaint  Patient presents with   Depression   Anxiety   Follow-up   HPI: This patient is a 63 year old married white male lives with his wife and son in [place]. 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 He had also been working at [employer] but had an arm injury at work and had to quit there as well   The patient returns for follow-up after 4 months regarding his depression and anxiety.  Overall he is doing okay but feels fatigued a lot of the time.  He states he is only getting 4 to 5 hours of sleep at night.  He tends to think about things at night and cannot get to sleep.  In the past he had used trazodone  which was helpful and would like to have a refill.  I think this is reasonable.  He denies significant depression or anxiety and feels like the medications are still helpful.  He is working with Dr.  Fairy Homer his PCP to try to get his blood sugar under better control.  He is still on Mounjaro and his weight seems to have plateaued after 25 pound loss.  Most of his recent labs were normal.  He is advised to get more hydration. Visit Diagnosis:    ICD-10-CM   1. Moderate single current episode of major depressive disorder (HCC)  F32.1     2. Generalized anxiety disorder  F41.1       Past Psychiatric History: none  Past Medical History:  Past Medical History:  Diagnosis Date   Anxiety    COPD (chronic obstructive pulmonary disease) (HCC)    Coronary atherosclerosis of native coronary artery    a. 12/09/2013: s/p PCI with 3.0 x 28 Promus DES to mLAD   Depression    Diabetic neuropathy (HCC)    Elbow injury 07/25/2017   Surgery for torn tendon   Essential hypertension    Fatty liver disease, nonalcoholic    GERD (gastroesophageal reflux disease)    Iron deficiency anemia 08/15/2021   Lateral epicondylitis of right elbow    Mixed hyperlipidemia    Obesity    Osgood-Schlatter's disease of right knee    Skin cyst 10/03/2017   Removed from back   Type 2 diabetes mellitus [facility])     Past Surgical History:  Procedure Laterality Date   BIOPSY  01/21/2019   Procedure: BIOPSY;  Surgeon: Ruby Corporal, MD;  [facility];  Service: Endoscopy;;  gastric bx and gastric polyps   BIOPSY  11/08/2020   Procedure: BIOPSY;  Surgeon: Urban Garden, MD;  [facility];  Service: Gastroenterology;;   CARDIAC CATHETERIZATION  2011   CARDIAC CATHETERIZATION N/A 09/20/2015   Procedure: Left Heart Cath and Coronary Angiography;  Surgeon: Odie Benne, MD;  Location: [facility] INVASIVE CV LAB;  Service: Cardiovascular;  Laterality: N/A;   CHOLECYSTECTOMY  2003   COLONOSCOPY  06/19/2012   Procedure: COLONOSCOPY;  Surgeon: Ruby Corporal, MD;  [facility];  Service: Endoscopy;  Laterality: N/A;  930   COLONOSCOPY N/A 10/17/2017   Procedure: COLONOSCOPY;   Surgeon: Ruby Corporal, MD;  [facility];  Service: Endoscopy;  Laterality: N/A;  1225   COLONOSCOPY WITH PROPOFOL  N/A 11/08/2020   Procedure: COLONOSCOPY WITH PROPOFOL ;  Surgeon: Urban Garden, MD;  [facility];  Service: Gastroenterology;  Laterality: N/A;  10:45   COLONOSCOPY WITH PROPOFOL  N/A 04/04/2021   Procedure: COLONOSCOPY WITH PROPOFOL ;  Surgeon: Urban Garden, MD;  [facility];  Service: Gastroenterology;  Laterality: N/A;   CORONARY PRESSURE/FFR STUDY N/A 04/10/2022   Procedure: INTRAVASCULAR PRESSURE WIRE/FFR STUDY;  Surgeon: Arty Binning, MD;  Location: [facility] INVASIVE CV LAB;  Service: Cardiovascular;  Laterality: N/A;   CYST EXCISION  07/2019   ESOPHAGOGASTRODUODENOSCOPY (EGD) WITH PROPOFOL  N/A 01/21/2019   Procedure: ESOPHAGOGASTRODUODENOSCOPY (EGD) WITH PROPOFOL ;  Surgeon: Ruby Corporal, MD;  [facility];  Service: Endoscopy;  Laterality: N/A;  10:15am   ESOPHAGOGASTRODUODENOSCOPY (EGD) WITH PROPOFOL  N/A 02/17/2021   Procedure: ESOPHAGOGASTRODUODENOSCOPY (EGD) WITH PROPOFOL ;  Surgeon: Urban Garden, MD;  [facility];  Service: Gastroenterology;  Laterality: N/A;  10:20   ESOPHAGOGASTRODUODENOSCOPY (EGD) WITH PROPOFOL  N/A 04/04/2021   Procedure: ESOPHAGOGASTRODUODENOSCOPY (EGD) WITH PROPOFOL ;  Surgeon: Urban Garden, MD;  [facility];  Service: Gastroenterology;  Laterality: N/A;  12:30   GIVENS CAPSULE STUDY N/A 09/13/2021   Procedure: GIVENS CAPSULE STUDY;  Surgeon: Urban Garden, MD;  [facility];  Service: Gastroenterology;  Laterality: N/A;  7:30   KNEE ARTHROPLASTY Right 1999   LEFT HEART CATH AND CORONARY ANGIOGRAPHY N/A 02/26/2018   Procedure: LEFT HEART CATH AND CORONARY ANGIOGRAPHY;  Surgeon: Lucendia Rusk, MD;  Location: [facility] INVASIVE CV LAB;  Service: Cardiovascular;  Laterality: N/A;   LEFT HEART CATH AND CORONARY ANGIOGRAPHY  N/A 04/10/2022   Procedure: LEFT HEART CATH AND CORONARY ANGIOGRAPHY;  Surgeon: Arty Binning, MD;  Location: [facility] INVASIVE CV LAB;  Service: Cardiovascular;  Laterality: N/A;   LEFT HEART CATHETERIZATION WITH CORONARY ANGIOGRAM N/A 12/09/2013   Procedure: LEFT HEART CATHETERIZATION WITH CORONARY ANGIOGRAM;  Surgeon: Lucendia Rusk, MD;  Location: [facility] CATH LAB;  Service: Cardiovascular;  Laterality: N/A;   Liver biopsy     POLYPECTOMY  10/17/2017   Procedure: POLYPECTOMY;  Surgeon: Ruby Corporal, MD;  [facility];  Service: Endoscopy;;  ascending colon, splenic flexure,sigmoid x2   POLYPECTOMY  11/08/2020   Procedure: POLYPECTOMY;  Surgeon: Urban Garden, MD;  [facility];  Service: Gastroenterology;;   POLYPECTOMY  04/04/2021   Procedure: POLYPECTOMY;  Surgeon: Urban Garden, MD;  [facility];  Service: Gastroenterology;;   TENNIS ELBOW RELEASE/NIRSCHEL PROCEDURE Right 07/25/2017   Procedure: TENNIS ELBOW RELEASE and debriedment;  Surgeon: Darrin Emerald, MD;  Location: [facility];  Service: Orthopedics;  Laterality:  Right;   TONSILLECTOMY  1970's    Family Psychiatric History: See below  Family History:  Family History  Problem Relation Age of Onset   Osteoporosis Mother    Cancer Maternal Aunt    CVA Maternal Grandmother    CVA Maternal Grandfather    Healthy Son     Social History:  Social History   Socioeconomic History   Marital status: Married    Spouse name: Rolm Clos   Number of children: 1   Years of education: Not on file   Highest education level: 12th grade  Occupational History   Not on file  Tobacco Use   Smoking status: Former    Current packs/day: 0.00    Average packs/day: 0.5 packs/day for 10.6 years (5.3 ttl pk-yrs)    Types: Cigarettes    Start date: 02/02/1973    Quit date: 09/17/1983    Years since quitting: 40.4   Smokeless tobacco: Former    Types: Chew    Quit date: 09/17/1983  Vaping  Use   Vaping status: Never Used  Substance and Sexual Activity   Alcohol use: No    Alcohol/week: 0.0 standard drinks of alcohol   Drug use: No   Sexual activity: Yes  Other Topics Concern   Not on file  Social History Narrative   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    Married with 1 child-son   Right handed    12 th    Very little caffeine    One story 12 stairs up to door and 8 steps to front door   Social Drivers of Health   Financial Resource Strain: Patient Declined (10/21/2023)   Overall Financial Resource Strain (CARDIA)    Difficulty of Paying Living Expenses: Patient declined  Food Insecurity: Patient Declined (10/21/2023)   Hunger Vital Sign    Worried About Running Out of Food in the Last Year: Patient declined    Ran Out of Food in the Last Year: Patient declined  Transportation Needs: No Transportation Needs (10/21/2023)   PRAPARE - Administrator, Civil Service (Medical): No    Lack of Transportation (Non-Medical): No  Physical Activity: Insufficiently Active (10/21/2023)   Exercise Vital Sign    Days of Exercise per Week: 3 days    Minutes of Exercise per Session: 30 min  Stress: Stress Concern Present (10/21/2023)   [employer] of Occupational Health - Occupational Stress Questionnaire    Feeling of Stress : To some extent  Social Connections: Unknown (10/21/2023)   Social Connection and Isolation Panel    Frequency of Communication with Friends and Family: Patient declined    Frequency of Social Gatherings with Friends and Family: Patient declined    Attends Religious Services: Patient declined    Database administrator or Organizations: Yes    Attends Banker Meetings: Patient declined    Marital Status: Married    Allergies:  Allergies  Allergen Reactions   Bydureon [Exenatide] Diarrhea    Severe diarrhea and nausea   Ozempic (0.25 Or 0.5 Mg-Dose) [Semaglutide(0.25 Or 0.5mg -Dos)]  Diarrhea    Severe nausea and diarrhea   Doxycycline      Severe esophagitis    Metabolic Disorder Labs: Lab Results  Component Value Date   HGBA1C 7.5 (H) 02/07/2022   MPG 211.6 07/12/2018   MPG 197.25 07/23/2017   No results found for: PROLACTIN Lab Results  Component Value Date   CHOL 150 06/21/2023  TRIG 315 (H) 06/21/2023   HDL 31 (L) 06/21/2023   CHOLHDL 4.8 06/21/2023   VLDL UNABLE TO CALCULATE IF TRIGLYCERIDE OVER 400 mg/dL 52/84/1324   LDLCALC 69 06/21/2023   LDLCALC 46 06/26/2022   Lab Results  Component Value Date   TSH 0.648 08/20/2019   TSH 0.65 08/20/2019    Therapeutic Level Labs: No results found for: LITHIUM No results found for: VALPROATE No results found for: CBMZ  Current Medications: Current Outpatient Medications  Medication Sig Dispense Refill   acetaminophen  (TYLENOL ) 500 MG tablet Take 1,500 mg by mouth daily as needed for moderate pain.     albuterol  (VENTOLIN  HFA) 108 (90 Base) MCG/ACT inhaler INHALE 2 PUFFS EVERY 6 HOURS (Patient taking differently: INHALE 2 PUFFS EVERY 6 HOURS prn) 8.5 g 5   ALPRAZolam  (XANAX ) 1 MG tablet Take 1 tablet (1 mg total) by mouth 3 (three) times daily as needed for anxiety. 90 tablet 3   amLODipine  (NORVASC ) 5 MG tablet Take 1 tablet (5 mg total) by mouth daily. 90 tablet 1   aspirin  81 MG EC tablet Take 1 tablet (81 mg total) by mouth daily. 30 tablet 12   atorvastatin  (LIPITOR ) 40 MG tablet Take 1 tablet (40 mg total) by mouth daily. 90 tablet 1   B-D UF III MINI PEN NEEDLES 31G X 5 MM MISC USE UP TO 4 TIMES DAILY WITH INSULIN  AS DIRECTED. 100 each 3   colestipol  (COLESTID ) 1 g tablet TAKE TWO TABLETS (2 GRAMS TOTAL) BY MOUTH DAILY. TAKE FOUR HOURS APART FROM OTHER MEDICATION. 180 tablet 3   Continuous Glucose Receiver (FREESTYLE LIBRE 3 READER) DEVI by Does not apply route.     Continuous Glucose Sensor (FREESTYLE LIBRE 3 PLUS SENSOR) MISC by Does not apply route. Change sensor every 15 days.      ezetimibe  (ZETIA ) 10 MG tablet Take 1 tablet (10 mg total) by mouth daily. 30 tablet 5   FARXIGA  10 MG TABS tablet Take 10 mg by mouth daily.     fluticasone  (FLONASE ) 50 MCG/ACT nasal spray Place 2 sprays into both nostrils daily. 16 g 0   fluticasone -salmeterol (ADVAIR ) 250-50 MCG/ACT AEPB Inhale 1 puff into the lungs in the morning and at bedtime. 1 each 5   gabapentin  (NEURONTIN ) 300 MG capsule TAKE 2 CAPSULES BY MOUTH EACH MORNING AND 2 CAPSULE EACH EVENING. 120 capsule 6   HUMALOG KWIKPEN 200 UNIT/ML KwikPen Inject 30-55 Units into the skin 3 (three) times daily before meals.     HYDROcodone -acetaminophen  (NORCO/VICODIN) 5-325 MG tablet Take 1 tablet by mouth every 6 (six) hours as needed.     isosorbide  dinitrate (ISORDIL ) 30 MG tablet Take 30 mg by mouth daily at 6 (six) AM.     Lancets MISC 1 each by Does not apply route 4 (four) times daily. 150 each 5   loratadine  (CLARITIN ) 10 MG tablet Take 10 mg by mouth daily as needed for allergies.     meloxicam  (MOBIC ) 15 MG tablet Take 1 tablet (15 mg total) by mouth daily as needed for pain. Take with food 30 tablet 0   MOUNJARO 15 MG/0.5ML Pen Inject 15 mg into the skin once a week.     nitroGLYCERIN  (NITROSTAT ) 0.4 MG SL tablet Place 1 tablet (0.4 mg total) under the tongue every 5 (five) minutes as needed. 25 tablet 3   omeprazole  (PRILOSEC) 40 MG capsule TAKE ONE CAPSULE BY MOUTH DAILY 90 capsule 1   ondansetron  (ZOFRAN -ODT) 8 MG disintegrating tablet TAKE  ONE TABLET (8MG  TOTAL) BY MOUTH EVERY 8 HOURS AS NEEDED FRO NAUSEA OR VOMITING 30 tablet 0   propranolol  (INDERAL ) 20 MG tablet TAKE ONE TABLET (20MG  TOTAL) BY MOUTH TWO TIMES DAILY 90 tablet 2   ranolazine  (RANEXA ) 1000 MG SR tablet TAKE ONE TABLET (1000MG  TOTAL) BY MOUTH TWO TIMES DAILY 180 tablet 2   sertraline  (ZOLOFT ) 100 MG tablet Take 1.5 tablets (150 mg total) by mouth daily. 135 tablet 3   TOUJEO  MAX SOLOSTAR 300 UNIT/ML Solostar Pen INJECT 130 UNITS INTO THE SKIN AT BEDTIME.  (Patient taking differently: Inject 35-55 Units into the skin at bedtime.) 12 mL 0   traMADol  (ULTRAM ) 50 MG tablet Take 1 tablet (50 mg total) by mouth every 8 (eight) hours as needed for moderate pain or severe pain. 20 tablet 0   traZODone  (DESYREL ) 50 MG tablet Take 1 tablet (50 mg total) by mouth at bedtime as needed. 30 tablet 3   No current facility-administered medications for this visit.     Musculoskeletal: Strength & Muscle Tone: within normal limits Gait & Station: normal Patient leans: N/A  Psychiatric Specialty Exam: Review of Systems  Musculoskeletal:  Positive for back pain.  Psychiatric/Behavioral:  Positive for sleep disturbance.   All other systems reviewed and are negative.   There were no vitals taken for this visit.There is no height or weight on file to calculate BMI.  General Appearance: Casual and Fairly Groomed  Eye Contact:  Good  Speech:  Clear and Coherent  Volume:  Normal  Mood:  Euthymic  Affect:  Congruent  Thought Process:  Goal Directed  Orientation:  Full (Time, Place, and Person)  Thought Content: WDL   Suicidal Thoughts:  No  Homicidal Thoughts:  No  Memory:  Immediate;   Good Recent;   Good Remote;   Good  Judgement:  Good  Insight:  Fair  Psychomotor Activity:  Normal  Concentration:  Concentration: Good and Attention Span: Good  Recall:  Good  Fund of Knowledge: Good  Language: Good  Akathisia:  No  Handed:  Right  AIMS (if indicated): not done  Assets:  Communication Skills Desire for Improvement Resilience Social Support Talents/Skills  ADL's:  Intact  Cognition: WNL  Sleep:  Poor   Screenings: GAD-7    Flowsheet Row Office Visit from 02/27/2024 in [facility] Office Visit from 12/18/2023 in [facility] Office Visit from 10/21/2023 in [facility] Office Visit from 06/19/2023 in [facility] Office Visit from 08/08/2020  in [facility]  Total GAD-7 Score 0 3 4 4 3    PHQ2-9    Flowsheet Row Office Visit from 02/27/2024 in [facility] Office Visit from 12/18/2023 in [facility] Office Visit from 10/21/2023 in [facility] Office Visit from 06/19/2023 in [facility] Clinical Support from 05/31/2023 in [facility]  PHQ-2 Total Score 2 2 1  0 0  PHQ-9 Total Score 4 4 2 2 4    Flowsheet Row UC from 08/27/2022 in [facility] [place] UC from 08/17/2022 in [facility] ED from 05/17/2022 in [facility] Emergency Department at [facility]  C-SSRS RISK CATEGORY No Risk No Risk No Risk     Assessment and Plan: This patient is a 63 year old male with a history of depression anxiety and chronic pain.  His mood and  anxiety have been managed well but he is not sleeping well.  We will add trazodone  50 mg at bedtime back to his regimen.  He will continue Zoloft  150 mg daily for depression and Xanax  1 mg up to 3 times daily as needed for anxiety.  No return to see me in 4 months  Collaboration of Care: Collaboration of Care: Primary Care Provider AEB notes are shared with PCP on the epic system  Patient/Guardian was advised Release of Information must be obtained prior to any record release in order to collaborate their care with an outside provider. Patient/Guardian was advised if they have not already done so to contact the registration department to sign all necessary forms in order for us  to release information regarding their care.   Consent: Patient/Guardian gives verbal consent for treatment and assignment of benefits for services provided during this visit. Patient/Guardian expressed understanding and agreed to proceed.    [name], MD 03/04/2024, 11:04 AM

## 2024-03-11 ENCOUNTER — Ambulatory Visit: Admitting: Family Medicine

## 2024-03-11 ENCOUNTER — Encounter: Payer: Self-pay | Admitting: Family Medicine

## 2024-03-11 VITALS — BP 100/65 | HR 67 | Temp 98.4°F | Ht 74.0 in | Wt 257.0 lb

## 2024-03-11 DIAGNOSIS — I951 Orthostatic hypotension: Secondary | ICD-10-CM

## 2024-03-11 DIAGNOSIS — E1159 Type 2 diabetes mellitus with other circulatory complications: Secondary | ICD-10-CM | POA: Diagnosis not present

## 2024-03-11 NOTE — Progress Notes (Addendum)
   Subjective:    Patient ID: Jared Griffin, male    DOB: 1960-09-18, 64 y.o.   MRN: [id number] This verifies that the history review of any tests, physical exam, assessment and plan were conducted by Dr. Glendia Fielding and documented accordingly by him today Glendia Fielding MD primary care [place] [facility]  HPI Follow up for dizziness  Patient with ongoing spells of feeling dizzy and lightheaded These hit him when he stands up or moves from 1 room to the next room he will find himself feeling unsteady feeling like he is going to sometimes pass out denies any chest pain or tightness with this Previous visit we stopped amlodipine  and Farxiga  because of orthostasis and low blood pressure He states he tries to keep well-hydrated He is using his diabetes medicines His sugars are running on the high side  Review of Systems     Objective:   Physical Exam General-in no acute distress Eyes-no discharge Lungs-respiratory rate normal, CTA CV-no murmurs,RRR Extremities skin warm dry no edema Neuro grossly normal Behavior normal, alert  Blood pressure was checked sitting and standing sitting 92/60 standing 88/56       Assessment & Plan:  Orthostatic hypotension Most recently stopped his amlodipine  and for Zetia  Now with having these ongoing symptoms and low blood pressures certainly possible that this could be related to diabetic autonomic neuropathy but it is also possible that the Ranexa  is contributing we will connect with his cardiologist to see if perhaps we could reduce the strength of the medication Will let the patient know when I find out  Subpar control diabetes I have encouraged him to reach out to his endocrinologist so they can help adjust his insulin

## 2024-03-11 NOTE — Addendum Note (Signed)
 Addended by: ALPHONSA HAMILTON A on: 03/11/2024 09:16 PM   Modules accepted: Orders

## 2024-03-14 ENCOUNTER — Telehealth: Payer: Self-pay | Admitting: Family Medicine

## 2024-03-14 NOTE — Telephone Encounter (Signed)
 Nurses Please let Eusebio know that I did communicate with his cardiologist regarding his low blood pressures and his medication Ranexa  The cardiologist felt it was very important for him to continue Ranexa  to help his heart Cardiology Dr. Debera recommended adding midodrine 2.5 mg 1 taken twice daily #60 with 5 refills Typically this will help boost blood pressure I would also recommend a follow-up visit with Elveria somewhere in approximately 3 to 4 weeks to recheck blood pressure preferably sitting and standing thanks-Dr. Glendia if any questions let me know

## 2024-03-16 ENCOUNTER — Other Ambulatory Visit: Payer: Self-pay

## 2024-03-16 MED ORDER — MIDODRINE HCL 2.5 MG PO TABS: 2.5000 mg | ORAL_TABLET | Freq: Three times a day (TID) | ORAL | 5 refills | Status: DC

## 2024-03-23 DIAGNOSIS — E1165 Type 2 diabetes mellitus with hyperglycemia: Secondary | ICD-10-CM | POA: Diagnosis not present

## 2024-04-06 DIAGNOSIS — E1165 Type 2 diabetes mellitus with hyperglycemia: Secondary | ICD-10-CM | POA: Diagnosis not present

## 2024-04-23 DIAGNOSIS — E1165 Type 2 diabetes mellitus with hyperglycemia: Secondary | ICD-10-CM | POA: Diagnosis not present

## 2024-04-23 DIAGNOSIS — I2510 Atherosclerotic heart disease of native coronary artery without angina pectoris: Secondary | ICD-10-CM | POA: Diagnosis not present

## 2024-04-23 DIAGNOSIS — M79672 Pain in left foot: Secondary | ICD-10-CM | POA: Diagnosis not present

## 2024-04-23 DIAGNOSIS — M79671 Pain in right foot: Secondary | ICD-10-CM | POA: Diagnosis not present

## 2024-04-23 DIAGNOSIS — E1142 Type 2 diabetes mellitus with diabetic polyneuropathy: Secondary | ICD-10-CM | POA: Diagnosis not present

## 2024-05-21 DIAGNOSIS — D485 Neoplasm of uncertain behavior of skin: Secondary | ICD-10-CM | POA: Diagnosis not present

## 2024-05-21 DIAGNOSIS — L720 Epidermal cyst: Secondary | ICD-10-CM | POA: Diagnosis not present

## 2024-06-04 DIAGNOSIS — E1142 Type 2 diabetes mellitus with diabetic polyneuropathy: Secondary | ICD-10-CM | POA: Diagnosis not present

## 2024-06-04 DIAGNOSIS — M79672 Pain in left foot: Secondary | ICD-10-CM | POA: Diagnosis not present

## 2024-06-04 DIAGNOSIS — M79671 Pain in right foot: Secondary | ICD-10-CM | POA: Diagnosis not present

## 2024-06-05 ENCOUNTER — Ambulatory Visit: Payer: PPO

## 2024-06-05 VITALS — Ht 74.0 in | Wt 257.0 lb

## 2024-06-05 DIAGNOSIS — Z0000 Encounter for general adult medical examination without abnormal findings: Secondary | ICD-10-CM

## 2024-06-05 NOTE — Patient Instructions (Signed)
 Jared Griffin,  Thank you for taking the time for your Medicare Wellness Visit. I appreciate your continued commitment to your health goals. Please review the care plan we discussed, and feel free to reach out if I can assist you further.  Medicare recommends these wellness visits once per year to help you and your care team stay ahead of potential health issues. These visits are designed to focus on prevention, allowing your provider to concentrate on managing your acute and chronic conditions during your regular appointments.  Please note that Annual Wellness Visits do not include a physical exam. Some assessments may be limited, especially if the visit was conducted virtually. If needed, we may recommend a separate in-person follow-up with your provider.  Ongoing Care Seeing your primary care provider every 3 to 6 months helps us  monitor your health and provide consistent, personalized care.   Referrals If a referral was made during today's visit and you haven't received any updates within two weeks, please contact the referred provider directly to check on the status.  Recommended Screenings:  Health Maintenance  Topic Date Due   Yearly kidney health urinalysis for diabetes  02/08/2023   DTaP/Tdap/Td vaccine (2 - Tdap) 01/29/2024   Complete foot exam   01/31/2024   Eye exam for diabetics  02/07/2024   Colon Cancer Screening  04/04/2024   Flu Shot  04/17/2024   Hemoglobin A1C  05/11/2024   Yearly kidney function blood test for diabetes  02/26/2025   Medicare Annual Wellness Visit  06/05/2025   Pneumococcal Vaccine for age over 22  Completed   Hepatitis C Screening  Completed   HIV Screening  Completed   Zoster (Shingles) Vaccine  Completed   Hepatitis B Vaccine  Aged Out   HPV Vaccine  Aged Out   Meningitis B Vaccine  Aged Out   COVID-19 Vaccine  Discontinued       06/05/2024   11:00 AM  Advanced Directives  Does Patient Have a Medical Advance Directive? No  Would patient  like information on creating a medical advance directive? Yes (MAU/Ambulatory/Procedural Areas - Information given)   Advance Care Planning is important because it: Ensures you receive medical care that aligns with your values, goals, and preferences. Provides guidance to your family and loved ones, reducing the emotional burden of decision-making during critical moments.  Information on Advanced Care Planning can be found at [place]  Secretary of [facility] Advance Health Care Directives Advance Health Care Directives ([email or web address]   Vision: Annual vision screenings are recommended for early detection of glaucoma, cataracts, and diabetic retinopathy. These exams can also reveal signs of chronic conditions such as diabetes and high blood pressure.  Dental: Annual dental screenings help detect early signs of oral cancer, gum disease, and other conditions linked to overall health, including heart disease and diabetes.  Please see the attached documents for additional preventive care recommendations.

## 2024-06-05 NOTE — Progress Notes (Signed)
 Subjective:   Jared Griffin is a 63 y.o. who presents for a Medicare Wellness preventive visit.  As a reminder, Annual Wellness Visits don't include a physical exam, and some assessments may be limited, especially if this visit is performed virtually. We may recommend an in-person follow-up visit with your provider if needed.  Visit Complete: Virtual I connected with  Jared Griffin on 06/05/24 by a video and audio enabled telemedicine application and verified that I am speaking with the correct person using two identifiers.  Patient Location: Home  Provider Location: Home Office  I discussed the limitations of evaluation and management by telemedicine. The patient expressed understanding and agreed to proceed.  Vital Signs: Because this visit was a virtual/telehealth visit, some criteria may be missing or patient reported. Any vitals not documented were not able to be obtained and vitals that have been documented are patient reported.  Persons Participating in Visit: Patient.  AWV Questionnaire: No: Patient Medicare AWV questionnaire was not completed prior to this visit.  Cardiac Risk Factors include: advanced age (>83men, >37 women);diabetes mellitus;dyslipidemia;hypertension;male gender     Objective:    Today's Vitals   06/05/24 1040  Weight: 257 lb (116.6 kg)  Height: 6' 2 (1.88 m)   Body mass index is 33 kg/m.     06/05/2024   11:00 AM 05/31/2023   10:11 AM 05/03/2023    1:01 PM 03/13/2023    9:49 AM 11/01/2022    1:32 PM 10/29/2022   10:50 AM 05/17/2022   12:52 AM  Advanced Directives  Does Patient Have a Medical Advance Directive? No No No No No No Yes  Type of Surveyor, minerals;Living will  Healthcare Power of [place];Living will  Healthcare Power of [place];Living will  Does patient want to make changes to medical advance directive?   No - Patient declined  No - Patient declined    Copy of Healthcare Power of Attorney in  Chart?   No - copy requested  No - copy requested  No - copy requested  Would patient like information on creating a medical advance directive? Yes (MAU/Ambulatory/Procedural Areas - Information given) No - Patient declined No - Patient declined  No - Patient declined No - Patient declined     Current Medications (verified) Outpatient Encounter Medications as of 06/05/2024  Medication Sig   midodrine  (PROAMATINE ) 2.5 MG tablet Take 1 tablet (2.5 mg total) by mouth 3 (three) times daily with meals.   acetaminophen  (TYLENOL ) 500 MG tablet Take 1,500 mg by mouth daily as needed for moderate pain.   albuterol  (VENTOLIN  HFA) 108 (90 Base) MCG/ACT inhaler INHALE 2 PUFFS EVERY 6 HOURS   ALPRAZolam  (XANAX ) 1 MG tablet Take 1 tablet (1 mg total) by mouth 3 (three) times daily as needed for anxiety.   aspirin  81 MG EC tablet Take 1 tablet (81 mg total) by mouth daily.   atorvastatin  (LIPITOR ) 40 MG tablet Take 1 tablet (40 mg total) by mouth daily.   B-D UF III MINI PEN NEEDLES 31G X 5 MM MISC USE UP TO 4 TIMES DAILY WITH INSULIN  AS DIRECTED.   colestipol  (COLESTID ) 1 g tablet TAKE TWO TABLETS (2 GRAMS TOTAL) BY MOUTH DAILY. TAKE FOUR HOURS APART FROM OTHER MEDICATION.   Continuous Glucose Receiver (FREESTYLE LIBRE 3 READER) DEVI by Does not apply route.   Continuous Glucose Sensor (FREESTYLE LIBRE 3 PLUS SENSOR) MISC by Does not apply route. Change sensor every 15 days.  ezetimibe  (ZETIA ) 10 MG tablet Take 1 tablet (10 mg total) by mouth daily.   fluticasone  (FLONASE ) 50 MCG/ACT nasal spray Place 2 sprays into both nostrils daily.   fluticasone -salmeterol (ADVAIR ) 250-50 MCG/ACT AEPB Inhale 1 puff into the lungs in the morning and at bedtime.   gabapentin  (NEURONTIN ) 300 MG capsule TAKE 2 CAPSULES BY MOUTH EACH MORNING AND 2 CAPSULE EACH EVENING.   glipiZIDE  (GLUCOTROL  XL) 5 MG 24 hr tablet Take 5 mg by mouth daily with breakfast.   HUMALOG KWIKPEN 200 UNIT/ML KwikPen Inject 30-55 Units into the skin 3  (three) times daily before meals.   isosorbide  dinitrate (ISORDIL ) 30 MG tablet Take 30 mg by mouth daily at 6 (six) AM.   Lancets MISC 1 each by Does not apply route 4 (four) times daily.   loratadine  (CLARITIN ) 10 MG tablet Take 10 mg by mouth daily as needed for allergies.   meloxicam  (MOBIC ) 15 MG tablet Take 1 tablet (15 mg total) by mouth daily as needed for pain. Take with food   MOUNJARO 15 MG/0.5ML Pen Inject 15 mg into the skin once a week.   nitroGLYCERIN  (NITROSTAT ) 0.4 MG SL tablet Place 1 tablet (0.4 mg total) under the tongue every 5 (five) minutes as needed.   omeprazole  (PRILOSEC) 40 MG capsule TAKE ONE CAPSULE BY MOUTH DAILY   ondansetron  (ZOFRAN -ODT) 8 MG disintegrating tablet TAKE ONE TABLET (8MG  TOTAL) BY MOUTH EVERY 8 HOURS AS NEEDED FRO NAUSEA OR VOMITING   propranolol  (INDERAL ) 20 MG tablet TAKE ONE TABLET (20MG  TOTAL) BY MOUTH TWO TIMES DAILY   ranolazine  (RANEXA ) 1000 MG SR tablet TAKE ONE TABLET (1000MG  TOTAL) BY MOUTH TWO TIMES DAILY   sertraline  (ZOLOFT ) 100 MG tablet Take 1.5 tablets (150 mg total) by mouth daily.   TOUJEO  MAX SOLOSTAR 300 UNIT/ML Solostar Pen INJECT 130 UNITS INTO THE SKIN AT BEDTIME. (Patient taking differently: Inject 30-35 Units into the skin at bedtime.)   traMADol  (ULTRAM ) 50 MG tablet Take 1 tablet (50 mg total) by mouth every 8 (eight) hours as needed for moderate pain or severe pain.   traZODone  (DESYREL ) 50 MG tablet Take 1 tablet (50 mg total) by mouth at bedtime as needed.   No facility-administered encounter medications on file as of 06/05/2024.    Allergies (verified) Bydureon [exenatide], Ozempic (0.25 or 0.5 mg-dose) [semaglutide(0.25 or 0.5mg -dos)], and Doxycycline    History: Past Medical History:  Diagnosis Date   Anxiety    COPD (chronic obstructive pulmonary disease) (HCC)    Coronary atherosclerosis of native coronary artery    a. 12/09/2013: s/p PCI with 3.0 x 28 Promus DES to mLAD   Depression    Diabetic neuropathy  (HCC)    Elbow injury 07/25/2017   Surgery for torn tendon   Essential hypertension    Fatty liver disease, nonalcoholic    GERD (gastroesophageal reflux disease)    Iron deficiency anemia 08/15/2021   Lateral epicondylitis of right elbow    Mixed hyperlipidemia    Obesity    Osgood-Schlatter's disease of right knee    Skin cyst 10/03/2017   Removed from back   Type 2 diabetes mellitus (HCC)    Past Surgical History:  Procedure Laterality Date   BIOPSY  01/21/2019   Procedure: BIOPSY;  Surgeon: Golda Claudis PENNER, MD;  [facility];  Service: Endoscopy;;  gastric bx and gastric polyps   BIOPSY  11/08/2020   Procedure: BIOPSY;  Surgeon: Eartha Angelia Sieving, MD;  [facility];  Service: Gastroenterology;;   CARDIAC CATHETERIZATION  2011   CARDIAC CATHETERIZATION N/A 09/20/2015   Procedure: Left Heart Cath and Coronary Angiography;  Surgeon: Lonni JONETTA Cash, MD;  [facility]VAS[facility] CV LAB;  Service: Cardiovascular;  Laterality: N/A;   CHOLECYSTECTOMY  2003   COLONOSCOPY  06/19/2012   Procedure: COLONOSCOPY;  Surgeon: Claudis RAYMOND Rivet, MD;  [facility];  Service: Endoscopy;  Laterality: N/A;  930   COLONOSCOPY N/A 10/17/2017   Procedure: COLONOSCOPY;  Surgeon: Rivet Claudis RAYMOND, MD;  [facility];  Service: Endoscopy;  Laterality: N/A;  1225   COLONOSCOPY WITH PROPOFOL  N/A 11/08/2020   Procedure: COLONOSCOPY WITH PROPOFOL ;  Surgeon: Eartha Angelia Sieving, MD;  [facility];  Service: Gastroenterology;  Laterality: N/A;  10:45   COLONOSCOPY WITH PROPOFOL  N/A 04/04/2021   Procedure: COLONOSCOPY WITH PROPOFOL ;  Surgeon: Eartha Angelia Sieving, MD;  [facility];  Service: Gastroenterology;  Laterality: N/A;   CORONARY PRESSURE/FFR STUDY N/A 04/10/2022   Procedure: INTRAVASCULAR PRESSURE WIRE/FFR STUDY;  Surgeon: Claudene Victory ORN, MD;  Location: [facility]VASIVE CV LAB;  Service: Cardiovascular;  Laterality: N/A;   CYST  EXCISION  07/2019   ESOPHAGOGASTRODUODENOSCOPY (EGD) WITH PROPOFOL  N/A 01/21/2019   Procedure: ESOPHAGOGASTRODUODENOSCOPY (EGD) WITH PROPOFOL ;  Surgeon: Rivet Claudis RAYMOND, MD;  [facility];  Service: Endoscopy;  Laterality: N/A;  10:15am   ESOPHAGOGASTRODUODENOSCOPY (EGD) WITH PROPOFOL  N/A 02/17/2021   Procedure: ESOPHAGOGASTRODUODENOSCOPY (EGD) WITH PROPOFOL ;  Surgeon: Eartha Angelia Sieving, MD;  [facility];  Service: Gastroenterology;  Laterality: N/A;  10:20   ESOPHAGOGASTRODUODENOSCOPY (EGD) WITH PROPOFOL  N/A 04/04/2021   Procedure: ESOPHAGOGASTRODUODENOSCOPY (EGD) WITH PROPOFOL ;  Surgeon: Eartha Angelia Sieving, MD;  [facility];  Service: Gastroenterology;  Laterality: N/A;  12:30   GIVENS CAPSULE STUDY N/A 09/13/2021   Procedure: GIVENS CAPSULE STUDY;  Surgeon: Eartha Angelia Sieving, MD;  [facility];  Service: Gastroenterology;  Laterality: N/A;  7:30   KNEE ARTHROPLASTY Right 1999   LEFT HEART CATH AND CORONARY ANGIOGRAPHY N/A 02/26/2018   Procedure: LEFT HEART CATH AND CORONARY ANGIOGRAPHY;  Surgeon: Dann Candyce RAMAN, MD;  Location: [facility] INVASIVE CV LAB;  Service: Cardiovascular;  Laterality: N/A;   LEFT HEART CATH AND CORONARY ANGIOGRAPHY N/A 04/10/2022   Procedure: LEFT HEART CATH AND CORONARY ANGIOGRAPHY;  Surgeon: Claudene Victory ORN, MD;  Location: [facility]VASIVE CV LAB;  Service: Cardiovascular;  Laterality: N/A;   LEFT HEART CATHETERIZATION WITH CORONARY ANGIOGRAM N/A 12/09/2013   Procedure: LEFT HEART CATHETERIZATION WITH CORONARY ANGIOGRAM;  Surgeon: Candyce RAMAN Dann, MD;  Location[facility] CATH LAB;  Service: Cardiovascular;  Laterality: N/A;   Liver biopsy     POLYPECTOMY  10/17/2017   Procedure: POLYPECTOMY;  Surgeon: Rivet Claudis RAYMOND, MD;  [facility];  Service: Endoscopy;;  ascending colon, splenic flexure,sigmoid x2   POLYPECTOMY  11/08/2020   Procedure: POLYPECTOMY;  Surgeon: Eartha Angelia Sieving, MD;  [facility];  Service: Gastroenterology;;   POLYPECTOMY  04/04/2021   Procedure: POLYPECTOMY;  Surgeon: Eartha Angelia Sieving, MD;  [facility];  Service: Gastroenterology;;   TENNIS ELBOW RELEASE/NIRSCHEL PROCEDURE Right 07/25/2017   Procedure: TENNIS ELBOW RELEASE and debriedment;  Surgeon: Margrette Taft BRAVO, MD;  Location: [facility];  Service: Orthopedics;  Laterality: Right;   TONSILLECTOMY  1970's   Family History  Problem Relation Age of Onset   Osteoporosis Mother    Cancer Maternal Aunt    CVA Maternal Grandmother  CVA Maternal Grandfather    Healthy Son    Social History   Socioeconomic History   Marital status: Married    Spouse name: Leretha   Number of children: 1   Years of education: Not on file   Highest education level: 12th grade  Occupational History   Not on file  Tobacco Use   Smoking status: Former    Current packs/day: 0.00    Average packs/day: 0.5 packs/day for 10.6 years (5.3 ttl pk-yrs)    Types: Cigarettes    Start date: 02/02/1973    Quit date: 09/17/1983    Years since quitting: 40.7   Smokeless tobacco: Former    Types: Chew    Quit date: 09/17/1983  Vaping Use   Vaping status: Never Used  Substance and Sexual Activity   Alcohol use: No    Alcohol/week: 0.0 standard drinks of alcohol   Drug use: No   Sexual activity: Yes  Other Topics Concern   Not on file  Social History Narrative   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    Married with 1 child-son   Right handed    12 th    Very little caffeine    One story 12 stairs up to door and 8 steps to front door   Social Drivers of Health   Financial Resource Strain: Low Risk  (06/05/2024)   Overall Financial Resource Strain (CARDIA)    Difficulty of Paying Living Expenses: Not hard at all  Food Insecurity: No Food Insecurity (06/05/2024)   Hunger Vital Sign    Worried About Running Out of Food in the Last Year: Never true     Ran Out of Food in the Last Year: Never true  Transportation Needs: No Transportation Needs (06/05/2024)   PRAPARE - Administrator, Civil Service (Medical): No    Lack of Transportation (Non-Medical): No  Physical Activity: Insufficiently Active (06/05/2024)   Exercise Vital Sign    Days of Exercise per Week: 3 days    Minutes of Exercise per Session: 30 min  Stress: No Stress Concern Present (06/05/2024)   [employer] of Occupational Health - Occupational Stress Questionnaire    Feeling of Stress: Only a little  Social Connections: Unknown (06/05/2024)   Social Connection and Isolation Panel    Frequency of Communication with Friends and Family: Patient declined    Frequency of Social Gatherings with Friends and Family: Patient declined    Attends Religious Services: Patient declined    Database administrator or Organizations: Yes    Attends Engineer, structural: Patient declined    Marital Status: Married    Tobacco Counseling Counseling given: Not Answered    Clinical Intake:  Pre-visit preparation completed: Yes  Pain : No/denies pain  Diabetes: Yes CBG done?: No Did pt. bring in CBG monitor from home?: No  Lab Results  Component Value Date   HGBA1C 7.5 (H) 02/07/2022   HGBA1C 6.6 10/18/2020   HGBA1C 7.5 (A) 12/24/2019     How often do you need to have someone help you when you read instructions, pamphlets, or other written materials from your doctor or pharmacy?: 1 - Never  Interpreter Needed?: No  Information entered by :: Charmaine Bloodgood LPN   Activities of Daily Living     06/05/2024   10:42 AM  In your present state of health, do you have any difficulty performing the following activities:  Hearing? 0  Vision? 0  Difficulty concentrating or making decisions? 0  Walking or climbing stairs? 0  Dressing or bathing? 0  Doing errands, shopping? 0  Preparing Food and eating ? N  Using the Toilet? N  In the past six months, have  you accidently leaked urine? N  Do you have problems with loss of bowel control? N  Managing your Medications? N  Managing your Finances? N  Housekeeping or managing your Housekeeping? N    Patient Care Team: Alphonsa Glendia LABOR, MD as PCP - General (Family Medicine) Debera Jayson MATSU, MD as PCP - Cardiology (Cardiology) Okey Barnie SAUNDERS, MD as Consulting Physician (Behavioral Health) Roy Harlene HERO, NP as Nurse Practitioner (Nurse Practitioner) Margrette Taft BRAVO, MD as Consulting Physician (Orthopedic Surgery) Eldonna Novel, MD as Consulting Physician (Physical Medicine and Rehabilitation) Blinda Katz, DPM as Consulting Physician (Podiatry) [facility]    I have updated your Care Teams any recent Medical Services you may have received from other providers in the past year.     Assessment:   This is a routine wellness examination for Crispin.  Hearing/Vision screen Hearing Screening - Comments:: Denies hearing difficulties   Vision Screening - Comments:: Wears rx glasses - up to date with routine eye exams with [facility]    Goals Addressed             This Visit's Progress    DIET - REDUCE CALORIE INTAKE   On track    Pt states he would like to lose weight.      Maintain health and independence   On track      Depression Screen     06/05/2024   10:52 AM 03/11/2024    3:40 PM 02/27/2024    3:21 PM 12/18/2023   10:40 AM 10/21/2023   11:17 AM 06/19/2023   10:01 AM 05/31/2023   10:13 AM  PHQ 2/9 Scores  PHQ - 2 Score 1 1 2 2 1  0 0  PHQ- 9 Score 5 5 4 4 2 2 4     Fall Risk     06/05/2024   11:00 AM 02/27/2024    3:21 PM 10/21/2023   11:17 AM 06/19/2023   10:00 AM 05/31/2023   10:11 AM  Fall Risk   Falls in the past year? 0 1 0 1 0  Number falls in past yr: 0 0  1 0  Injury with Fall? 0 0  0 0  Risk for fall due to : No Fall Risks    No Fall Risks;Orthopedic patient  Follow up Falls prevention discussed;Education  provided;Falls evaluation completed    Falls prevention discussed;Education provided    MEDICARE RISK AT HOME:  Medicare Risk at Home Any stairs in or around the home?: No If so, are there any without handrails?: No Home free of loose throw rugs in walkways, pet beds, electrical cords, etc?: Yes Adequate lighting in your home to reduce risk of falls?: Yes Life alert?: No Use of a cane, walker or w/c?: No Grab bars in the bathroom?: Yes Shower chair or bench in shower?: No Elevated toilet seat or a handicapped toilet?: No  TIMED UP AND GO:  Was the test performed?  No  Cognitive Function: 6CIT completed        06/05/2024   11:00 AM 05/31/2023   10:13 AM 08/08/2021    1:46 PM  6CIT Screen  What Year? 0 points 0 points 0 points  What month? 0 points 0 points 0  points  What time? 0 points 0 points 0 points  Count back from 20 0 points 0 points 0 points  Months in reverse 0 points 0 points 0 points  Repeat phrase 0 points 0 points 0 points  Total Score 0 points 0 points 0 points    Immunizations Immunization History  Administered Date(s) Administered   Influenza, Seasonal, Injecte, Preservative Fre 07/10/2023   Influenza,inj,Quad PF,6+ Mos 07/11/2016, 08/02/2017, 08/05/2018, 08/10/2019, 08/08/2020, 06/21/2021, 06/26/2022   Influenza-Unspecified 06/18/2013, 06/17/2014, 07/06/2015   PNEUMOCOCCAL CONJUGATE-20 06/26/2022   Td 01/28/2014   Zoster Recombinant(Shingrix) 04/05/2022, 09/25/2022    Screening Tests Health Maintenance  Topic Date Due   Diabetic kidney evaluation - Urine ACR  02/08/2023   DTaP/Tdap/Td (2 - Tdap) 01/29/2024   FOOT EXAM  01/31/2024   OPHTHALMOLOGY EXAM  02/07/2024   Colonoscopy  04/04/2024   Influenza Vaccine  04/17/2024   HEMOGLOBIN A1C  05/11/2024   Diabetic kidney evaluation - eGFR measurement  02/26/2025   Medicare Annual Wellness (AWV)  06/05/2025   Pneumococcal Vaccine: 50+ Years  Completed   Hepatitis C Screening  Completed   HIV  Screening  Completed   Zoster Vaccines- Shingrix  Completed   Hepatitis B Vaccines 19-59 Average Risk  Aged Out   HPV VACCINES  Aged Out   Meningococcal B Vaccine  Aged Out   COVID-19 Vaccine  Discontinued    Health Maintenance Items Addressed: Information provided on vaccine recommendations; patient plans to reach out to GI to schedule colon screening   Additional Screening:  Vision Screening: Recommended annual ophthalmology exams for early detection of glaucoma and other disorders of the eye. Is the patient up to date with their annual eye exam?  Yes  Who is the provider or what is the name of the office in which the patient attends annual eye exams? [facility]  Dental Screening: Recommended annual dental exams for proper oral hygiene  Community Resource Referral / Chronic Care Management: CRR required this visit?  No   CCM required this visit?  No   Plan:    I have personally reviewed and noted the following in the patient's chart:   Medical and social history Use of alcohol, tobacco or illicit drugs  Current medications and supplements including opioid prescriptions. Patient is not currently taking opioid prescriptions. Functional ability and status Nutritional status Physical activity Advanced directives List of other physicians Hospitalizations, surgeries, and ER visits in previous 12 months Vitals Screenings to include cognitive, depression, and falls Referrals and appointments  In addition, I have reviewed and discussed with patient certain preventive protocols, quality metrics, and best practice recommendations. A written personalized care plan for preventive services as well as general preventive health recommendations were provided to patient.   Lavelle Pfeiffer [place], [place]   [id number]   After Visit Summary: (MyChart) Due to this being a telephonic visit, the after visit summary with patients personalized plan was offered to patient via MyChart    Notes: Nothing significant to report at this time.

## 2024-06-19 ENCOUNTER — Encounter: Payer: Self-pay | Admitting: Family Medicine

## 2024-06-19 ENCOUNTER — Ambulatory Visit: Admitting: Family Medicine

## 2024-06-19 ENCOUNTER — Other Ambulatory Visit: Payer: Self-pay | Admitting: Family Medicine

## 2024-06-19 DIAGNOSIS — E785 Hyperlipidemia, unspecified: Secondary | ICD-10-CM

## 2024-06-19 DIAGNOSIS — Z0000 Encounter for general adult medical examination without abnormal findings: Secondary | ICD-10-CM

## 2024-06-19 DIAGNOSIS — Z23 Encounter for immunization: Secondary | ICD-10-CM

## 2024-06-19 DIAGNOSIS — Z125 Encounter for screening for malignant neoplasm of prostate: Secondary | ICD-10-CM

## 2024-06-19 NOTE — Progress Notes (Signed)
   Subjective:    Patient ID: Jared Griffin, male    DOB: 10/03/1960, 63 y.o.   MRN: [id number]  HPI The patient comes in today for a wellness visit. Very nice patient Going through a stressful separation He is on friendly basis with his wife but the stress of living together with combination of health issues going on was very difficult He is doing better now Denies any chest tightness pressure pain shortness of breath He is disabled He has severe diabetes and underlying heart disease    A review of their health history was completed.  A review of medications was also completed.  Any needed refills; no  Eating habits: good  Falls/  MVA accidents in past few months: no  Regular exercise: walking and biking  Specialist pt sees on regular basis: cardiology, ortho, psych, endocrine  Preventative health issues were discussed.   Additional concerns: None   Review of Systems     Objective:   Physical Exam General-in no acute distress Eyes-no discharge Lungs-respiratory rate normal, CTA CV-no murmurs,RRR Extremities skin warm dry no edema Neuro grossly normal Behavior normal, alert Prostate exam normal       Assessment & Plan:  1. Immunization due Today - Flu vaccine trivalent PF, 6mos and older(Flulaval,Afluria,Fluarix,Fluzone)  2. Well adult exam (Primary) Adult wellness-complete.wellness physical was conducted today. Importance of diet and exercise were discussed in detail.  Importance of stress reduction and healthy living were discussed.  In addition to this a discussion regarding safety was also covered.  We also reviewed over immunizations and gave recommendations regarding current immunization needed for age.   In addition to this additional areas were also touched on including: Preventative health exams needed:  Colonoscopy he is due we will send message to gastroenterology  Patient was advised yearly wellness exam  - Lipid Profile - PSA  3.  Hyperlipidemia, unspecified hyperlipidemia type Continue medication check lab work - Lipid Profile  4. Screening for prostate cancer Check lab - PSA Follow-up 6 months\  Patient has diabetes followed by [facility] we will request the A1c in order to update his findings on his chart

## 2024-06-20 ENCOUNTER — Ambulatory Visit: Payer: Self-pay | Admitting: Family Medicine

## 2024-06-20 LAB — LIPID PANEL
Chol/HDL Ratio: 4.2 ratio (ref 0.0–5.0)
Cholesterol, Total: 150 mg/dL (ref 100–199)
HDL: 36 mg/dL — ABNORMAL LOW (ref >39)
LDL Chol Calc (NIH): 68 mg/dL (ref 0–99)
Triglycerides: 285 mg/dL — ABNORMAL HIGH (ref 0–149)
VLDL Cholesterol Cal: 46 mg/dL — ABNORMAL HIGH (ref 5–40)

## 2024-06-20 LAB — PSA: Prostate Specific Ag, Serum: 0.5 ng/mL (ref 0.0–4.0)

## 2024-06-23 ENCOUNTER — Telehealth: Payer: Self-pay | Admitting: Family Medicine

## 2024-06-23 ENCOUNTER — Encounter: Payer: Self-pay | Admitting: Family Medicine

## 2024-06-23 NOTE — Telephone Encounter (Signed)
 Provider requested most recnet A1C lab from [facility] Dr. Harlene Bill . I have faxed record request in EPIC.

## 2024-06-24 ENCOUNTER — Encounter: Payer: Self-pay | Admitting: Family Medicine

## 2024-06-25 ENCOUNTER — Other Ambulatory Visit: Payer: Self-pay | Admitting: Cardiology

## 2024-06-25 DIAGNOSIS — L600 Ingrowing nail: Secondary | ICD-10-CM | POA: Diagnosis not present

## 2024-06-25 DIAGNOSIS — M542 Cervicalgia: Secondary | ICD-10-CM

## 2024-06-25 DIAGNOSIS — M79674 Pain in right toe(s): Secondary | ICD-10-CM | POA: Diagnosis not present

## 2024-06-25 DIAGNOSIS — R251 Tremor, unspecified: Secondary | ICD-10-CM

## 2024-07-01 ENCOUNTER — Encounter (INDEPENDENT_AMBULATORY_CARE_PROVIDER_SITE_OTHER): Payer: Self-pay | Admitting: Gastroenterology

## 2024-07-07 ENCOUNTER — Encounter (HOSPITAL_COMMUNITY): Payer: Self-pay | Admitting: Psychiatry

## 2024-07-07 ENCOUNTER — Telehealth (INDEPENDENT_AMBULATORY_CARE_PROVIDER_SITE_OTHER): Admitting: Psychiatry

## 2024-07-07 DIAGNOSIS — F321 Major depressive disorder, single episode, moderate: Secondary | ICD-10-CM

## 2024-07-07 DIAGNOSIS — F411 Generalized anxiety disorder: Secondary | ICD-10-CM | POA: Diagnosis not present

## 2024-07-07 MED ORDER — ALPRAZOLAM 1 MG PO TABS: 1.0000 mg | ORAL_TABLET | Freq: Three times a day (TID) | ORAL | 3 refills | Status: AC | PRN

## 2024-07-07 MED ORDER — SERTRALINE HCL 100 MG PO TABS
150.0000 mg | ORAL_TABLET | Freq: Every day | ORAL | 3 refills | Status: AC
Start: 2024-07-07 — End: ?

## 2024-07-07 NOTE — Progress Notes (Signed)
 Virtual Visit via Video Note  I connected with Jared Griffin on 07/07/24 at 11:20 AM EDT by a video enabled telemedicine application and verified that I am speaking with the correct person using two identifiers.  Location: Patient: home Provider: office   I discussed the limitations of evaluation and management by telemedicine and the availability of in person appointments. The patient expressed understanding and agreed to proceed.      I discussed the assessment and treatment plan with the patient. The patient was provided an opportunity to ask questions and all were answered. The patient agreed with the plan and demonstrated an understanding of the instructions.   The patient was advised to call back or seek an in-person evaluation if the symptoms worsen or if the condition fails to improve as anticipated.  I provided 20 minutes of non-face-to-face time during this encounter.   Jared Gull, MD  [facility] MD/PA/NP OP Progress Note  07/07/2024 11:38 AM Jared Griffin  MRN:  [id number]  Chief Complaint:  Chief Complaint  Patient presents with   Depression   Anxiety   Follow-up   HPI: This patient is a 63 year old separated white male who is living with his son in [place].  He is retired from the [employer].  The patient returns for follow-up after 4 months regarding his major depression and generalized anxiety disorder.  The patient states overall he is doing well now.  However over the summer his life got somewhat difficult.  He and his wife are getting along.  He mentioned before that she over spends and has a lot of credit card debt which cause stress between them.  She also was constantly getting sick and going to the emergency room on an almost daily basis by his report.  He finally got fed up and asked to separate.  Apparently she is moved in with her mother and he and his son are still staying at the same house.  He feels very relieved and states that  they really did not have much of a intimate relationship anymore and he feels better just being friends and living separately.  His mood has improved tremendously.  He is sleeping better.  He does use the trazodone  but only occasionally.  He still feels that the Zoloft  has helped his depression and Xanax  continues to help his anxiety. Visit Diagnosis:    ICD-10-CM   1. Moderate single current episode of major depressive disorder (HCC)  F32.1     2. Generalized anxiety disorder  F41.1       Past Psychiatric History: none  Past Medical History:  Past Medical History:  Diagnosis Date   Anxiety    COPD (chronic obstructive pulmonary disease) (HCC)    Coronary atherosclerosis of native coronary artery    a. 12/09/2013: s/p PCI with 3.0 x 28 Promus DES to mLAD   Depression    Diabetic neuropathy (HCC)    Elbow injury 07/25/2017   Surgery for torn tendon   Essential hypertension    Fatty liver disease, nonalcoholic    GERD (gastroesophageal reflux disease)    Iron deficiency anemia 08/15/2021   Lateral epicondylitis of right elbow    Mixed hyperlipidemia    Obesity    Osgood-Schlatter's disease of right knee    Skin cyst 10/03/2017   Removed from back   Type 2 diabetes mellitus [facility])     Past Surgical History:  Procedure Laterality Date   BIOPSY  01/21/2019   Procedure: BIOPSY;  Surgeon: Golda Claudis PENNER, MD;  [facility];  Service: Endoscopy;;  gastric bx and gastric polyps   BIOPSY  11/08/2020   Procedure: BIOPSY;  Surgeon: Eartha Angelia Sieving, MD;  [facility];  Service: Gastroenterology;;   CARDIAC CATHETERIZATION  2011   CARDIAC CATHETERIZATION N/A 09/20/2015   Procedure: Left Heart Cath and Coronary Angiography;  Surgeon: Lonni JONETTA Cash, MD;  Location: [facility] INVASIVE CV LAB;  Service: Cardiovascular;  Laterality: N/A;   CHOLECYSTECTOMY  2003   COLONOSCOPY  06/19/2012   Procedure: COLONOSCOPY;  Surgeon: Claudis PENNER Golda, MD;  [facility];  Service: Endoscopy;  Laterality: N/A;  930   COLONOSCOPY N/A 10/17/2017   Procedure: COLONOSCOPY;  Surgeon: Golda Claudis PENNER, MD;  [facility];  Service: Endoscopy;  Laterality: N/A;  1225   COLONOSCOPY WITH PROPOFOL  N/A 11/08/2020   Procedure: COLONOSCOPY WITH PROPOFOL ;  Surgeon: Eartha Angelia Sieving, MD;  [facility];  Service: Gastroenterology;  Laterality: N/A;  10:45   COLONOSCOPY WITH PROPOFOL  N/A 04/04/2021   Procedure: COLONOSCOPY WITH PROPOFOL ;  Surgeon: Eartha Angelia Sieving, MD;  [facility];  Service: Gastroenterology;  Laterality: N/A;   CORONARY PRESSURE/FFR STUDY N/A 04/10/2022   Procedure: INTRAVASCULAR PRESSURE WIRE/FFR STUDY;  Surgeon: Claudene Victory ORN, MD;  Location: [facility] INVASIVE CV LAB;  Service: Cardiovascular;  Laterality: N/A;   CYST EXCISION  07/2019   ESOPHAGOGASTRODUODENOSCOPY (EGD) WITH PROPOFOL  N/A 01/21/2019   Procedure: ESOPHAGOGASTRODUODENOSCOPY (EGD) WITH PROPOFOL ;  Surgeon: Golda Claudis PENNER, MD;  [facility];  Service: Endoscopy;  Laterality: N/A;  10:15am   ESOPHAGOGASTRODUODENOSCOPY (EGD) WITH PROPOFOL  N/A 02/17/2021   Procedure: ESOPHAGOGASTRODUODENOSCOPY (EGD) WITH PROPOFOL ;  Surgeon: Eartha Angelia Sieving, MD;  [facility];  Service: Gastroenterology;  Laterality: N/A;  10:20   ESOPHAGOGASTRODUODENOSCOPY (EGD) WITH PROPOFOL  N/A 04/04/2021   Procedure: ESOPHAGOGASTRODUODENOSCOPY (EGD) WITH PROPOFOL ;  Surgeon: Eartha Angelia Sieving, MD;  [facility];  Service: Gastroenterology;  Laterality: N/A;  12:30   GIVENS CAPSULE STUDY N/A 09/13/2021   Procedure: GIVENS CAPSULE STUDY;  Surgeon: Eartha Angelia Sieving, MD;  [facility];  Service: Gastroenterology;  Laterality: N/A;  7:30   KNEE ARTHROPLASTY Right 1999   LEFT HEART CATH AND CORONARY ANGIOGRAPHY N/A 02/26/2018   Procedure: LEFT HEART CATH AND CORONARY ANGIOGRAPHY;  Surgeon: Dann Candyce RAMAN, MD;  Location:  [facility] INVASIVE CV LAB;  Service: Cardiovascular;  Laterality: N/A;   LEFT HEART CATH AND CORONARY ANGIOGRAPHY N/A 04/10/2022   Procedure: LEFT HEART CATH AND CORONARY ANGIOGRAPHY;  Surgeon: Claudene Victory ORN, MD;  Location: [facility] INVASIVE CV LAB;  Service: Cardiovascular;  Laterality: N/A;   LEFT HEART CATHETERIZATION WITH CORONARY ANGIOGRAM N/A 12/09/2013   Procedure: LEFT HEART CATHETERIZATION WITH CORONARY ANGIOGRAM;  Surgeon: Candyce RAMAN Dann, MD;  Location: [facility] CATH LAB;  Service: Cardiovascular;  Laterality: N/A;   Liver biopsy     POLYPECTOMY  10/17/2017   Procedure: POLYPECTOMY;  Surgeon: Golda Claudis PENNER, MD;  [facility];  Service: Endoscopy;;  ascending colon, splenic flexure,sigmoid x2   POLYPECTOMY  11/08/2020   Procedure: POLYPECTOMY;  Surgeon: Eartha Angelia Sieving, MD;  [facility];  Service: Gastroenterology;;   POLYPECTOMY  04/04/2021   Procedure: POLYPECTOMY;  Surgeon: Eartha Angelia Sieving, MD;  [facility];  Service: Gastroenterology;;   TENNIS ELBOW RELEASE/NIRSCHEL PROCEDURE Right 07/25/2017   Procedure: TENNIS ELBOW RELEASE and debriedment;  Surgeon: Margrette Taft BRAVO, MD;  Location: [facility];  Service: Orthopedics;  Laterality: Right;   TONSILLECTOMY  1970's    Family Psychiatric History: none  Family History:  Family History  Problem Relation Age of Onset   Osteoporosis Mother    Cancer Maternal Aunt    CVA Maternal Grandmother    CVA Maternal Grandfather    Healthy Son     Social History:  Social History   Socioeconomic History   Marital status: Married    Spouse name: Leretha   Number of children: 1   Years of education: Not on file   Highest education level: 12th grade  Occupational History   Not on file  Tobacco Use   Smoking status: Former    Current packs/day: 0.00    Average packs/day: 0.5 packs/day for 10.6 years (5.3 ttl pk-yrs)    Types: Cigarettes    Start date: 02/02/1973    Quit date: 09/17/1983    Years  since quitting: 40.8   Smokeless tobacco: Former    Types: Chew    Quit date: 09/17/1983  Vaping Use   Vaping status: Never Used  Substance and Sexual Activity   Alcohol use: No    Alcohol/week: 0.0 standard drinks of alcohol   Drug use: No   Sexual activity: Yes  Other Topics Concern   Not on file  Social History Narrative   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    Married with 1 child-son   Right handed    12 th    Very little caffeine    One story 12 stairs up to door and 8 steps to front door   Social Drivers of Health   Financial Resource Strain: Low Risk  (06/05/2024)   Overall Financial Resource Strain (CARDIA)    Difficulty of Paying Living Expenses: Not hard at all  Food Insecurity: No Food Insecurity (06/05/2024)   Hunger Vital Sign    Worried About Running Out of Food in the Last Year: Never true    Ran Out of Food in the Last Year: Never true  Transportation Needs: No Transportation Needs (06/05/2024)   PRAPARE - Administrator, Civil Service (Medical): No    Lack of Transportation (Non-Medical): No  Physical Activity: Insufficiently Active (06/05/2024)   Exercise Vital Sign    Days of Exercise per Week: 3 days    Minutes of Exercise per Session: 30 min  Stress: No Stress Concern Present (06/05/2024)   [employer] of Occupational Health - Occupational Stress Questionnaire    Feeling of Stress: Only a little  Social Connections: Unknown (06/05/2024)   Social Connection and Isolation Panel    Frequency of Communication with Friends and Family: Patient declined    Frequency of Social Gatherings with Friends and Family: Patient declined    Attends Religious Services: Patient declined    Database administrator or Organizations: Yes    Attends Banker Meetings: Patient declined    Marital Status: Married    Allergies:  Allergies  Allergen Reactions   Bydureon [Exenatide] Diarrhea     Severe diarrhea and nausea   Ozempic (0.25 Or 0.5 Mg-Dose) [Semaglutide(0.25 Or 0.5mg -Dos)] Diarrhea    Severe nausea and diarrhea   Doxycycline      Severe esophagitis    Metabolic Disorder Labs: Lab Results  Component Value Date   HGBA1C 7.5 (H) 02/07/2022   MPG 211.6 07/12/2018   MPG 197.25 07/23/2017   No results found for: PROLACTIN Lab Results  Component Value Date   CHOL 150 06/19/2024   TRIG 285 (H) 06/19/2024   HDL 36 (L) 06/19/2024   CHOLHDL 4.2 06/19/2024   VLDL UNABLE TO CALCULATE IF TRIGLYCERIDE OVER 400 mg/dL 89/73/7980   LDLCALC 68 06/19/2024   LDLCALC 69 06/21/2023   Lab Results  Component Value Date   TSH 0.648 08/20/2019   TSH 0.65 08/20/2019    Therapeutic Level Labs: No results found for: LITHIUM No results found for: VALPROATE No results found for: CBMZ  Current Medications: Current Outpatient Medications  Medication Sig Dispense Refill   midodrine  (PROAMATINE ) 2.5 MG tablet Take 1 tablet (2.5 mg total) by mouth 3 (three) times daily with meals. 60 tablet 5   acetaminophen  (TYLENOL ) 500 MG tablet Take 1,500 mg by mouth daily as needed for moderate pain.     albuterol  (VENTOLIN  HFA) 108 (90 Base) MCG/ACT inhaler INHALE 2 PUFFS EVERY 6 HOURS 8.5 g 5   ALPRAZolam  (XANAX ) 1 MG tablet Take 1 tablet (1 mg total) by mouth 3 (three) times daily as needed for anxiety. 90 tablet 3   aspirin  81 MG EC tablet Take 1 tablet (81 mg total) by mouth daily. 30 tablet 12   atorvastatin  (LIPITOR ) 40 MG tablet Take 1 tablet (40 mg total) by mouth daily. 90 tablet 1   B-D UF III MINI PEN NEEDLES 31G X 5 MM MISC USE UP TO 4 TIMES DAILY WITH INSULIN  AS DIRECTED. 100 each 3   colestipol  (COLESTID ) 1 g tablet TAKE TWO TABLETS (2 GRAMS TOTAL) BY MOUTH DAILY. TAKE FOUR HOURS APART FROM OTHER MEDICATION. 180 tablet 3   Continuous Glucose Receiver (FREESTYLE LIBRE 3 READER) DEVI by Does not apply route.     Continuous Glucose Sensor (FREESTYLE LIBRE 3 PLUS SENSOR) MISC  by Does not apply route. Change sensor every 15 days.     ezetimibe  (ZETIA ) 10 MG tablet TAKE ONE TABLET (10MG  TOTAL) BY MOUTH DAILY 30 tablet 5   fluticasone  (FLONASE ) 50 MCG/ACT nasal spray Place 2 sprays into both nostrils daily. 16 g 0   fluticasone -salmeterol (ADVAIR ) 250-50 MCG/ACT AEPB Inhale 1 puff into the lungs in the morning and at bedtime. 1 each 5   gabapentin  (NEURONTIN ) 300 MG capsule TAKE 2 CAPSULES BY MOUTH EACH MORNING AND 2 CAPSULE EACH EVENING. 120 capsule 6   glipiZIDE  (GLUCOTROL  XL) 5 MG 24 hr tablet Take 5 mg by mouth daily with breakfast.     HUMALOG KWIKPEN 200 UNIT/ML KwikPen Inject 30-55 Units into the skin 3 (three) times daily before meals.     isosorbide  dinitrate (ISORDIL ) 30 MG tablet Take 30 mg by mouth daily at 6 (six) AM.     JARDIANCE 10 MG TABS tablet Take 10 mg by mouth daily.     Lancets MISC 1 each by Does not apply route 4 (four) times daily. 150 each 5   loratadine  (CLARITIN ) 10 MG tablet Take 10 mg by mouth daily as needed for allergies.     meloxicam  (MOBIC ) 15 MG tablet Take 1 tablet (15 mg total) by mouth daily as needed for pain. Take with food 30 tablet 0   MOUNJARO 15 MG/0.5ML Pen Inject 15 mg into the skin once a week.     nitroGLYCERIN  (NITROSTAT ) 0.4 MG SL tablet Place 1 tablet (0.4 mg total) under the tongue every 5 (five) minutes as needed. 25 tablet 3   omeprazole  (PRILOSEC) 40 MG capsule TAKE ONE CAPSULE BY MOUTH DAILY 90 capsule 1   ondansetron  (ZOFRAN -ODT) 8  MG disintegrating tablet TAKE ONE TABLET (8MG  TOTAL) BY MOUTH EVERY 8 HOURS AS NEEDED FRO NAUSEA OR VOMITING 30 tablet 0   propranolol  (INDERAL ) 20 MG tablet TAKE ONE TABLET (20MG  TOTAL) BY MOUTH TWO TIMES DAILY 90 tablet 0   ranolazine  (RANEXA ) 1000 MG SR tablet TAKE ONE TABLET (1000MG  TOTAL) BY MOUTH TWO TIMES DAILY 180 tablet 2   sertraline  (ZOLOFT ) 100 MG tablet Take 1.5 tablets (150 mg total) by mouth daily. 135 tablet 3   TOUJEO  MAX SOLOSTAR 300 UNIT/ML Solostar Pen INJECT 130  UNITS INTO THE SKIN AT BEDTIME. (Patient taking differently: Inject 30-35 Units into the skin at bedtime.) 12 mL 0   traMADol  (ULTRAM ) 50 MG tablet Take 1 tablet (50 mg total) by mouth every 8 (eight) hours as needed for moderate pain or severe pain. 20 tablet 0   traZODone  (DESYREL ) 50 MG tablet Take 1 tablet (50 mg total) by mouth at bedtime as needed. 30 tablet 3   No current facility-administered medications for this visit.     Musculoskeletal: Strength & Muscle Tone: within normal limits Gait & Station: normal Patient leans: N/A  Psychiatric Specialty Exam: Review of Systems  All other systems reviewed and are negative.   There were no vitals taken for this visit.There is no height or weight on file to calculate BMI.  General Appearance: Casual and Fairly Groomed  Eye Contact:  Good  Speech:  Clear and Coherent  Volume:  Normal  Mood:  Euthymic  Affect:  Congruent  Thought Process:  Goal Directed  Orientation:  Full (Time, Place, and Person)  Thought Content: WDL   Suicidal Thoughts:  No  Homicidal Thoughts:  No  Memory:  Immediate;   Good Recent;   Good Remote;   Good  Judgement:  Good  Insight:  Good  Psychomotor Activity:  Normal  Concentration:  Concentration: Good and Attention Span: Good  Recall:  Good  Fund of Knowledge: Good  Language: Good  Akathisia:  No  Handed:  Right  AIMS (if indicated): not done  Assets:  Communication Skills Desire for Improvement Resilience Social Support Talents/Skills  ADL's:  Intact  Cognition: WNL  Sleep:  Good   Screenings: GAD-7    Flowsheet Row Office Visit from 06/19/2024 in [facility] Office Visit from 03/11/2024 in [facility] Office Visit from 02/27/2024 in [facility] [place] [facility] Office Visit from 12/18/2023 in [facility] [place] [facility] Office Visit from 10/21/2023 in [facility]  Total GAD-7 Score 4 3 0 3 4    PHQ2-9    Flowsheet Row Office Visit from 06/19/2024 in [facility] Clinical Support from 06/05/2024 in [facility] Office Visit from 03/11/2024 in [facility] Office Visit from 02/27/2024 in [facility] [place] [facility] Office Visit from 12/18/2023 in [facility]  PHQ-2 Total Score 2 1 1 2 2   PHQ-9 Total Score 5 5 5 4 4    Flowsheet Row UC from 08/27/2022 in [facility] [place] UC from 08/17/2022 in [facility] ED from 05/17/2022 in [facility] Emergency Department at [facility]  C-SSRS RISK CATEGORY No Risk No Risk No Risk     Assessment and Plan: This patient is a 63 year old male with a history of major depression, generalized anxiety and sometimes insomnia.  Despite the recent marital separation he is doing well.  He will continue Zoloft  150 mg daily for major depression and Xanax  1 mg up to 3 times daily as needed for generalized anxiety.  He does use trazodone  50 mg occasionally for sleep.  He will return to see me in 4 months  Collaboration of Care: Collaboration of Care: Primary Care Provider AEB notes are shared with PCP on the epic system  Patient/Guardian was advised Release of Information must be obtained prior to any record release in order to collaborate their care with an outside provider. Patient/Guardian was advised if they have not already done so to contact the registration department to sign all necessary forms in order for us  to release information regarding their care.   Consent: Patient/Guardian gives verbal consent for treatment and assignment of benefits for services provided during this visit. Patient/Guardian expressed understanding and agreed to proceed.    Jared Gull, MD 07/07/2024, 11:38 AM

## 2024-07-16 DIAGNOSIS — M79672 Pain in left foot: Secondary | ICD-10-CM | POA: Diagnosis not present

## 2024-07-16 DIAGNOSIS — M79671 Pain in right foot: Secondary | ICD-10-CM | POA: Diagnosis not present

## 2024-07-16 DIAGNOSIS — E1142 Type 2 diabetes mellitus with diabetic polyneuropathy: Secondary | ICD-10-CM | POA: Diagnosis not present

## 2024-07-20 ENCOUNTER — Encounter: Payer: Self-pay | Admitting: Radiology

## 2024-07-22 ENCOUNTER — Other Ambulatory Visit: Payer: Self-pay | Admitting: Cardiology

## 2024-07-24 DIAGNOSIS — I2510 Atherosclerotic heart disease of native coronary artery without angina pectoris: Secondary | ICD-10-CM | POA: Diagnosis not present

## 2024-08-17 ENCOUNTER — Other Ambulatory Visit: Payer: Self-pay | Admitting: Cardiology

## 2024-08-17 ENCOUNTER — Other Ambulatory Visit (INDEPENDENT_AMBULATORY_CARE_PROVIDER_SITE_OTHER): Payer: Self-pay

## 2024-08-17 DIAGNOSIS — R251 Tremor, unspecified: Secondary | ICD-10-CM

## 2024-08-17 DIAGNOSIS — M542 Cervicalgia: Secondary | ICD-10-CM

## 2024-08-17 MED ORDER — OMEPRAZOLE 40 MG PO CPDR: 40.0000 mg | DELAYED_RELEASE_CAPSULE | Freq: Every day | ORAL | 1 refills | Status: AC

## 2024-08-28 ENCOUNTER — Encounter: Payer: Self-pay | Admitting: Student

## 2024-08-28 ENCOUNTER — Ambulatory Visit: Attending: Student | Admitting: Student

## 2024-08-28 VITALS — BP 116/64 | HR 86 | Ht 74.0 in | Wt 255.0 lb

## 2024-08-28 DIAGNOSIS — I951 Orthostatic hypotension: Secondary | ICD-10-CM | POA: Diagnosis not present

## 2024-08-28 DIAGNOSIS — I10 Essential (primary) hypertension: Secondary | ICD-10-CM

## 2024-08-28 DIAGNOSIS — I2510 Atherosclerotic heart disease of native coronary artery without angina pectoris: Secondary | ICD-10-CM | POA: Diagnosis not present

## 2024-08-28 DIAGNOSIS — E785 Hyperlipidemia, unspecified: Secondary | ICD-10-CM

## 2024-08-28 MED ORDER — ATORVASTATIN CALCIUM 40 MG PO TABS: 40.0000 mg | ORAL_TABLET | Freq: Every day | ORAL | 3 refills | Status: AC

## 2024-08-28 MED ORDER — RANOLAZINE ER 1000 MG PO TB12: 1000.0000 mg | ORAL_TABLET | Freq: Two times a day (BID) | ORAL | 3 refills | Status: AC

## 2024-08-28 MED ORDER — PROPRANOLOL HCL 20 MG PO TABS: 20.0000 mg | ORAL_TABLET | Freq: Two times a day (BID) | ORAL | 3 refills | Status: AC

## 2024-08-28 MED ORDER — EZETIMIBE 10 MG PO TABS: 10.0000 mg | ORAL_TABLET | Freq: Every day | ORAL | 3 refills | Status: AC

## 2024-08-28 NOTE — Progress Notes (Signed)
 Cardiology Office Note    Date:  08/28/2024  ID:  RYLYNN SCHONEMAN, DOB [birth year], MRN [id number] Cardiologist: Jayson Sierras, MD { :  History of Present Illness:    Jared Griffin is a 63 y.o. male with past medical history of CAD (s/p DES to mid-LAD in 11/2013 with patent stent by repeat cath in 09/2015 and 02/2018 with 25% residual stenosis along D2 and mid-LAD, catheterization in 03/2022 with 30 to 40% stenosis along the proximal and distal areas of the LAD stent but abnormal IMR and reduced CFR consistent with microvascular dysfunction), HTN, HLD and Type 2 DM who presents to the office today for 79-month follow-up.  He was last examined by Dr. Sierras in 10/2023 and reported intermittent orthostatic lightheadedness but no specific anginal symptoms. Losartan  was discontinued as he had lost over 25 pounds while being on Mounjaro. He was continued on ASA 81 mg daily, Amlodipine  5 mg daily, Farxiga  10 mg daily, Zetia  10 mg daily, Imdur  30 mg daily, Propranolol  20 mg twice daily and Ranexa  1000 mg twice daily.  In talking with the patient today, he reports overall doing well since his last office visit. Reports that his symptoms of orthostasis improved in the interim as Amlodipine  was discontinued and he was started on Midodrine  by his PCP. He does consume at least 4-5 bottles of water  on a daily basis along with occasional soda. He denies any recent exertional chest pain or progressive dyspnea on exertion. Does note occasional discomfort along his left side which occurs at rest and he feels like this is due to the position he sleeps in at night. No recent orthopnea, PND or pitting edema.  Studies Reviewed:   EKG: EKG is ordered today and demonstrates:   EKG Interpretation Date/Time:  Friday August 28 2024 15:00:15 EST Ventricular Rate:  86 PR Interval:  232 QRS Duration:  78 QT Interval:  370 QTC Calculation: 442 R Axis:   32  Text Interpretation: Sinus rhythm with 1st degree  A-V block When compared with ECG of 23-Apr-2023 10:44, No significant change was found Confirmed by Johnson Grate ([id number]) on 08/28/2024 4:48:44 PM       Cardiac Catheterization: 03/2022 CONCLUSIONS: Widely patent epicardial coronaries as documented on prior coronary angiography several years ago. Angiographically nonobstructive 30 to 40% stenosis proximal to the LAD stent and 30 to 40% stenosis distal to the LAD stent. Normal LV function with normal EDP. Abnormal coronary physiologic study with elevated intramyocardial resistance (IMR) at 36 (nl < 25) and reduced reduced coronary flow reserve at 2.4.  These findings confirm microvascular dysfunction.   RECOMMENDATIONS:   Discontinue long-acting nitrates.  Use negative inotropic calcium  channel blocker therapy and beta-blocker therapy as tolerated.  Aggressive lipid-lowering.  Cardiac rehab if tolerable. Okay to use sublingual nitroglycerin  for episodes of prolonged pain if helpful.   Physical Exam:   VS:  BP 116/64   Pulse 86   Ht 6' 2 (1.88 m)   Wt 255 lb (115.7 kg)   SpO2 97%   BMI 32.74 kg/m    Wt Readings from Last 3 Encounters:  08/28/24 255 lb (115.7 kg)  06/05/24 257 lb (116.6 kg)  03/11/24 257 lb (116.6 kg)     GEN: Well nourished, well developed male appearing in no acute distress NECK: No JVD; No carotid bruits CARDIAC: RRR, no murmurs, rubs, gallops RESPIRATORY:  Clear to auscultation without rales, wheezing or rhonchi  ABDOMEN: Appears non-distended. No obvious abdominal masses. EXTREMITIES: No clubbing or cyanosis.  No pitting edema.  Distal pedal pulses are 2+ bilaterally.   Assessment and Plan:   1. Coronary artery disease involving native coronary artery of native heart without angina pectoris - He previously underwent stenting to the LAD in 2015 and most recent cardiac catheterization in 03/2022 showed a patent stent with evidence of microvascular dysfunction given his reduced CFR. Was recommended to  stop nitrates at that time but Isordil  is listed on his medication list but has not been sent in recently. I asked the patient to verify if he is still taking this. For now, continue current medical therapy with ASA 81 mg daily, Atorvastatin  40 mg daily, Zetia  10 mg daily, Propranolol  20 mg twice daily and Ranexa  1000 mg twice daily.  2. Essential hypertension - BP is stable at 116/64 during today's visit. He is on Midodrine  given orthostatic hypotension as outlined below. Remains on Propranolol  20 mg twice daily but is on this for a tremor as well.  3. Hyperlipidemia LDL goal <70 - LDL was at 69 when checked in 06/2023. Will request a copy of his recent labs from Endocrinology. Remains on Atorvastatin  40 mg daily and Zetia  10 mg daily.  4. Orthostatic hypotension - Reports symptoms have improved since being started on Midodrine  2.5 mg 3 times daily by his PCP. He recently started a beet supplement and I reviewed with him that this could cause transient drops in blood pressure. If he develops any worsening dizziness, would recommend stopping this. Of note, he does report Jardiance was recently titrated to 25 mg daily by Endocrinology but he is overall tolerating this well. The importance of adequate hydration was reviewed.   Signed, Laymon CHRISTELLA Qua, PA-C

## 2024-08-28 NOTE — Patient Instructions (Addendum)
° ° °  Let us  know if taking Isordil  (Isosorbide ).     Medication Instructions:   Your physician recommends that you continue on your current medications as directed. Please refer to the Current Medication list given to you today.   Labwork: None today  Testing/Procedures: None today  Follow-Up: 1 year with B.Strader,PA-C or Dr.McDowell  Any Other Special Instructions Will Be Listed Below (If Applicable).  If you need a refill on your cardiac medications before your next appointment, please call your pharmacy.

## 2024-09-16 ENCOUNTER — Telehealth: Payer: Self-pay | Admitting: Physical Medicine and Rehabilitation

## 2024-09-16 ENCOUNTER — Telehealth: Payer: Self-pay

## 2024-09-16 NOTE — Telephone Encounter (Signed)
 Pt called requesting an appt for back injection. Pt last appt 01/07/24. Pt number is [phone].

## 2024-09-16 NOTE — Telephone Encounter (Signed)
 SABRA

## 2024-09-24 ENCOUNTER — Ambulatory Visit: Admitting: Physical Medicine and Rehabilitation

## 2024-09-24 ENCOUNTER — Encounter: Payer: Self-pay | Admitting: Physical Medicine and Rehabilitation

## 2024-09-24 DIAGNOSIS — M545 Low back pain, unspecified: Secondary | ICD-10-CM

## 2024-09-24 DIAGNOSIS — M7918 Myalgia, other site: Secondary | ICD-10-CM

## 2024-09-24 DIAGNOSIS — M546 Pain in thoracic spine: Secondary | ICD-10-CM | POA: Diagnosis not present

## 2024-09-24 DIAGNOSIS — G8929 Other chronic pain: Secondary | ICD-10-CM

## 2024-09-24 MED ORDER — TIZANIDINE HCL 4 MG PO TABS: 4.0000 mg | ORAL_TABLET | Freq: Every day | ORAL | 0 refills | Status: AC

## 2024-09-24 MED ORDER — MELOXICAM 15 MG PO TABS: 15.0000 mg | ORAL_TABLET | Freq: Every day | ORAL | 0 refills | Status: AC

## 2024-09-24 NOTE — Progress Notes (Unsigned)
 Pain Scale   Average Pain 5 Patient advising he has middle to lower back pain that is constant. Patient had injection 2025 however the area is different.        +Driver, -BT, -Dye Allergies.

## 2024-09-24 NOTE — Progress Notes (Unsigned)
 "  Jared Griffin - 64 y.o. male MRN [id number]  Date of birth: 1961-03-19  Office Visit Note: Visit Date: 09/24/2024 PCP: Alphonsa Glendia LABOR, MD Referred by: Alphonsa Glendia LABOR, MD  Subjective: Chief Complaint  Patient presents with   Middle Back - Pain   HPI: Jared Griffin is a 64 y.o. male who comes in today for evaluation of chronic right sided middle back pain radiating down to lower back. We have seen him in the past for both thoracic and lumbar spine. Also performed right greater trochanter injection in April of 2025. He does have history of more chronic pain with diffuse musculoskeletal pain to entire back.   His pain increased about 1 month ago. His pain is constant, no specific aggravating factors. He describes pain as tight and sore sensation, currently rates as 5 out of 10. Some relief of pain with home exercise regimen, heating pad, rest and use of medications. No relief of pain with meloxicam . History of physical therapy/chiropractic treatments, dry needling and chiropractic treatments, all provide some short term relief of pain. Thoracic MRI imaging from 2024 exhibits up to mild foraminal narrowing bilaterally at T10-11. No high grade spinal canal stenosis. Lumbar MRI imaging from May of 2024 exhibits left annual tear at L1-L2, left lateral disc protrusion at L2-L3, leftward disc protrusion at L4-L5, no high grade spinal canal stenosis noted. He does not have issues with pain radiating to hips or down legs today. He feels this pain is considerably different than prior issues. Patient denies focal weakness, numbness and tingling. No recent trauma or falls.      Review of Systems  Musculoskeletal:  Positive for back pain and myalgias.  Neurological:  Negative for tingling, sensory change, focal weakness and weakness.  All other systems reviewed and are negative.  Otherwise per HPI.  Assessment & Plan: Visit Diagnoses:    ICD-10-CM   1. Chronic right-sided thoracic back pain   M54.6 Ambulatory referral to Physical Therapy   G89.29     2. Chronic right-sided low back pain without sciatica  M54.50 Ambulatory referral to Physical Therapy   G89.29     3. Myofascial pain syndrome  M79.18 Ambulatory referral to Physical Therapy       Plan: Findings:  Chronic right sided middle and lower back pain. No radicular symptoms. Patient continues to have pain despite good conservative therapies such as home exercise regimen, rest and use of medications. Patients clinical presentation and exam are consistent with myofascial pain syndrome. Myofascial tenderness noted to right thoracic back today upon palpation. No significant nerve compression on recent thoracic MRI imaging. He does have lateral recess narrowing at L3-L4 on prior lumbar MRI imaging, however his symptoms are not radicular in nature today. We discussed treatment plan in detail today. Next step is to place order for short course of physical therapy with dry needling. I did place order for PT to be done at [facility] as this facility is closer to his home. I also discussed medication management and prescribed short course of meloxicam  and tizanidine . I would like him to try these medication consistently over the next month while attending PT. I instructed him to let us  know how he is doing post PT, could look at referral for more chronic pain management. He has no questions at this time. No red flag symptoms noted upon exam today.     Meds & Orders:  Meds ordered this encounter  Medications   meloxicam  (MOBIC ) 15 MG tablet  Sig: Take 1 tablet (15 mg total) by mouth daily.    Dispense:  30 tablet    Refill:  0   tiZANidine  (ZANAFLEX ) 4 MG tablet    Sig: Take 1 tablet (4 mg total) by mouth at bedtime.    Dispense:  30 tablet    Refill:  0    Orders Placed This Encounter  Procedures   Small Joint Inj   Ambulatory referral to Physical Therapy    Follow-up: Return if symptoms worsen or fail to improve.    Procedures: Small Joint Inj on 09/24/2024 8:52 AM Indications: pain Details: 22 G needle, posterior approach Medications: 60 mg ketorolac  30 MG/ML Outcome: tolerated well, no immediate complications  Intramuscular injection of Toradol  to left ventrogluteal region. Consent was given by the patient. Immediately prior to procedure a time out was called to verify the correct patient, procedure, equipment, support staff and site/side marked as required. Patient was prepped and draped in the usual sterile fashion.          Clinical History: CLINICAL DATA:  Low back pain, symptoms persist with greater than 6 weeks of treatment.   EXAM: MRI LUMBAR SPINE WITHOUT CONTRAST   TECHNIQUE: Multiplanar, multisequence MR imaging of the lumbar spine was performed. No intravenous contrast was administered.   COMPARISON:  MRI of the lumbar spine 10/10/19.   FINDINGS: Segmentation: 5 non rib-bearing lumbar type vertebral bodies are present. The lowest fully formed vertebral body is L5.   Alignment:  Slight retrolisthesis at L3-4 is stable.   Vertebrae: Prominent hemangioma at L3 is stable. Marrow signal and vertebral body heights are otherwise normal.   Conus medullaris and cauda equina: Conus extends to the L1 level. Conus and cauda equina appear normal.   Paraspinal and other soft tissues: A 13 mm simple cyst is present in the right kidney. No other focal lesions are present. Recommend no imaging follow-up for this lesion.   Disc levels:   T12-L1: Normal disc signal and height is present. No focal protrusion or stenosis is present.   L1-2: Far left annular tear is new since the prior study. No compressive stenosis is present.   L2-3: A far left lateral disc protrusion is new since the prior study. No focal stenosis is present.   L4-5: Disc desiccation and a leftward disc protrusion is present. This has progressed some. Mild left subarticular and foraminal stenosis has  progressed. The right foramen is patent.   L5-S1: Normal disc signal and height is present. No focal protrusion or stenosis is present.   IMPRESSION: 1. Progressive leftward disc protrusion at L4-5 with mild left subarticular and foraminal stenosis. 2. New far left lateral disc protrusion at L2-3 without focal stenosis. 3. Far left annular tear at L1-2 is new since the prior study. No compressive stenosis is present.     Electronically Signed   By: Lonni Necessary M.D.   On: 02/03/2023 09:50   He reports that he quit smoking about 41 years ago. His smoking use included cigarettes. He started smoking about 51 years ago. He has a 5.3 pack-year smoking history. He quit smokeless tobacco use about 41 years ago.  His smokeless tobacco use included chew. No results for input(s): HGBA1C, LABURIC in the last 8760 hours.  Objective:  VS:  HT:    WT:   BMI:     BP:   HR: bpm  TEMP: ( )  RESP:  Physical Exam Vitals and nursing note reviewed.  HENT:  Head: Normocephalic and atraumatic.     Right Ear: External ear normal.     Left Ear: External ear normal.     Nose: Nose normal.     Mouth/Throat:     Mouth: Mucous membranes are moist.  Eyes:     Extraocular Movements: Extraocular movements intact.  Cardiovascular:     Rate and Rhythm: Normal rate.     Pulses: Normal pulses.  Pulmonary:     Effort: Pulmonary effort is normal.  Abdominal:     General: Abdomen is flat. There is no distension.  Musculoskeletal:        General: Tenderness present.     Cervical back: Normal range of motion.     Comments: Patient rises from seated position to standing without difficulty. Good lumbar range of motion. No pain noted with facet loading. 5/5 strength noted with bilateral hip flexion, knee flexion/extension, ankle dorsiflexion/plantarflexion and EHL. No clonus noted bilaterally. No pain upon palpation of greater trochanters. No pain with internal/external rotation of bilateral hips.  Sensation intact bilaterally. Myofascial tenderness to right thoracic and lower back upon palpation today. Negative slump test bilaterally. Ambulates without aid, gait steady.     Skin:    General: Skin is warm and dry.     Capillary Refill: Capillary refill takes less than 2 seconds.  Neurological:     General: No focal deficit present.     Mental Status: He is alert and oriented to person, place, and time.  Psychiatric:        Mood and Affect: Mood normal.        Behavior: Behavior normal.     Ortho Exam  Imaging: No results found.  Past Medical/Family/Surgical/Social History: Medications & Allergies reviewed per EMR, new medications updated. Patient Active Problem List   Diagnosis Date Noted   Chronic obstructive pulmonary disease, unspecified COPD type (HCC) 06/19/2023   Aortic atherosclerosis 01/17/2023   Dysphagia 12/17/2022   Moderate single current episode of major depressive disorder (HCC) 09/26/2022   Coronary artery disease involving native coronary artery of native heart with angina pectoris 09/26/2022   Atypical chest pain 03/06/2022   Diarrhea 12/14/2021   Iron deficiency anemia 08/15/2021   Fever 03/15/2021   Lactate blood increase 03/15/2021   Sepsis (HCC) 03/15/2021   Tachycardia 03/15/2021   Orthostatic hypotension 12/07/2020   IBS (irritable bowel syndrome) 11/17/2020   Chronic diarrhea 08/17/2020   NASH (nonalcoholic steatohepatitis) 08/17/2020   Elevated LFTs 08/17/2020   Tremor of both hands 05/06/2020   Bilateral occipital neuralgia 05/06/2020   Abdominal pain, epigastric 01/13/2019   Anxiety 01/04/2019   Radicular pain of left lower extremity 12/22/2018   Moderate persistent asthma 10/25/2018   Dizziness 07/12/2018   Nonintractable headache    Abnormal nuclear stress test    Aftercare following surgery 07/25/17 08/13/2017   Hx of colonic polyps 08/07/2017   Lateral epicondylitis of right elbow    Cervical nerve root impingement 12/09/2015    Generalized anxiety disorder 09/27/2015   Pain in the chest    Depression 01/24/2015   Esophageal reflux 01/24/2015   Preoperative cardiovascular examination 06/28/2014   DM type 2 causing vascular disease (HCC)    Mixed hyperlipidemia    Obesity    Intermediate coronary syndrome (HCC) 12/09/2013   Lumbago 05/21/2012   Essential hypertension, benign 12/28/2009   CAD S/P LAD DES March 2015 12/28/2009   Hyperlipidemia LDL goal <70 11/25/2009   Accelerating angina (HCC) 11/25/2009   DM2 (diabetes mellitus, type 2) (HCC)  11/24/2009   ABDOMINAL PAIN, HX OF 11/24/2009   Past Medical History:  Diagnosis Date   Anxiety    COPD (chronic obstructive pulmonary disease) (HCC)    Coronary atherosclerosis of native coronary artery    a. 12/09/2013: s/p PCI with 3.0 x 28 Promus DES to mLAD   Depression    Diabetic neuropathy (HCC)    Elbow injury 07/25/2017   Surgery for torn tendon   Essential hypertension    Fatty liver disease, nonalcoholic    GERD (gastroesophageal reflux disease)    Iron deficiency anemia 08/15/2021   Lateral epicondylitis of right elbow    Mixed hyperlipidemia    Obesity    Osgood-Schlatter's disease of right knee    Skin cyst 10/03/2017   Removed from back   Type 2 diabetes mellitus (HCC)    Family History  Problem Relation Age of Onset   Osteoporosis Mother    Cancer Maternal Aunt    CVA Maternal Grandmother    CVA Maternal Grandfather    Healthy Son    Past Surgical History:  Procedure Laterality Date   BIOPSY  01/21/2019   Procedure: BIOPSY;  Surgeon: Golda Claudis PENNER, MD;  Location: [facility];  Service: Endoscopy;;  gastric bx and gastric polyps   BIOPSY  11/08/2020   Procedure: BIOPSY;  Surgeon: Eartha Angelia Sieving, MD;  Location: [facility];  Service: Gastroenterology;;   CARDIAC CATHETERIZATION  2011   CARDIAC CATHETERIZATION N/A 09/20/2015   Procedure: Left Heart Cath and Coronary Angiography;  Surgeon: Lonni JONETTA Cash, MD;   Location: [facility] INVASIVE CV LAB;  Service: Cardiovascular;  Laterality: N/A;   CHOLECYSTECTOMY  2003   COLONOSCOPY  06/19/2012   Procedure: COLONOSCOPY;  Surgeon: Claudis PENNER Golda, MD;  Location: [facility];  Service: Endoscopy;  Laterality: N/A;  930   COLONOSCOPY N/A 10/17/2017   Procedure: COLONOSCOPY;  Surgeon: Golda Claudis PENNER, MD;  Location: [facility];  Service: Endoscopy;  Laterality: N/A;  1225   COLONOSCOPY WITH PROPOFOL  N/A 11/08/2020   Procedure: COLONOSCOPY WITH PROPOFOL ;  Surgeon: Eartha Angelia Sieving, MD;  Location: [facility];  Service: Gastroenterology;  Laterality: N/A;  10:45   COLONOSCOPY WITH PROPOFOL  N/A 04/04/2021   Procedure: COLONOSCOPY WITH PROPOFOL ;  Surgeon: Eartha Angelia Sieving, MD;  Location: [facility];  Service: Gastroenterology;  Laterality: N/A;   CORONARY PRESSURE/FFR STUDY N/A 04/10/2022   Procedure: INTRAVASCULAR PRESSURE WIRE/FFR STUDY;  Surgeon: Claudene Victory ORN, MD;  Location: [facility];  Service: Cardiovascular;  Laterality: N/A;   CYST EXCISION  07/2019   ESOPHAGOGASTRODUODENOSCOPY (EGD) WITH PROPOFOL  N/A 01/21/2019   Procedure: ESOPHAGOGASTRODUODENOSCOPY (EGD) WITH PROPOFOL ;  Surgeon: Golda Claudis PENNER, MD;  Location: [facility];  Service: Endoscopy;  Laterality: N/A;  10:15am   ESOPHAGOGASTRODUODENOSCOPY (EGD) WITH PROPOFOL  N/A 02/17/2021   Procedure: ESOPHAGOGASTRODUODENOSCOPY (EGD) WITH PROPOFOL ;  Surgeon: Eartha Angelia Sieving, MD;  Location: [facility];  Service: Gastroenterology;  Laterality: N/A;  10:20   ESOPHAGOGASTRODUODENOSCOPY (EGD) WITH PROPOFOL  N/A 04/04/2021   Procedure: ESOPHAGOGASTRODUODENOSCOPY (EGD) WITH PROPOFOL ;  Surgeon: Eartha Angelia Sieving, MD;  Location: [facility];  Service: Gastroenterology;  Laterality: N/A;  12:30   GIVENS CAPSULE STUDY N/A 09/13/2021   Procedure: GIVENS CAPSULE STUDY;  Surgeon: Eartha Angelia Sieving, MD;  Location: [facility];  Service: Gastroenterology;  Laterality:  N/A;  7:30   KNEE ARTHROPLASTY Right 1999   LEFT HEART CATH AND CORONARY ANGIOGRAPHY N/A 02/26/2018   Procedure: LEFT HEART CATH AND CORONARY  ANGIOGRAPHY;  Surgeon: Dann Candyce RAMAN, MD;  Location: [facility] INVASIVE CV LAB;  Service: Cardiovascular;  Laterality: N/A;   LEFT HEART CATH AND CORONARY ANGIOGRAPHY N/A 04/10/2022   Procedure: LEFT HEART CATH AND CORONARY ANGIOGRAPHY;  Surgeon: Claudene Victory ORN, MD;  Location: [facility];  Service: Cardiovascular;  Laterality: N/A;   LEFT HEART CATHETERIZATION WITH CORONARY ANGIOGRAM N/A 12/09/2013   Procedure: LEFT HEART CATHETERIZATION WITH CORONARY ANGIOGRAM;  Surgeon: Candyce RAMAN Dann, MD;  Location: [facility] C[facility];  Service: Cardiovascular;  Laterality: N/A;   Liver biopsy     POLYPECTOMY  10/17/2017   Procedure: POLYPECTOMY;  Surgeon: Golda Claudis PENNER, MD;  Location: [facility];  Service: Endoscopy;;  ascending colon, splenic flexure,sigmoid x2   POLYPECTOMY  11/08/2020   Procedure: POLYPECTOMY;  Surgeon: Eartha Angelia Sieving, MD;  Location: [facility];  Service: Gastroenterology;;   POLYPECTOMY  04/04/2021   Procedure: POLYPECTOMY;  Surgeon: Eartha Angelia Sieving, MD;  Location: [facility];  Service: Gastroenterology;;   TENNIS ELBOW RELEASE/NIRSCHEL PROCEDURE Right 07/25/2017   Procedure: TENNIS ELBOW RELEASE and debriedment;  Surgeon: Margrette Taft BRAVO, MD;  Location: [facility];  Service: Orthopedics;  Laterality: Right;   TONSILLECTOMY  1970's   Social History   Occupational History   Not on file  Tobacco Use   Smoking status: Former    Current packs/day: 0.00    Average packs/day: 0.5 packs/day for 10.6 years (5.3 ttl pk-yrs)    Types: Cigarettes    Start date: 02/02/1973    Quit date: 09/17/1983    Years since quitting: 41.0   Smokeless tobacco: Former    Types: Chew    Quit date: 09/17/1983  Vaping Use   Vaping status: Never Used  Substance and Sexual Activity   Alcohol use: No    Alcohol/week: 0.0 standard  drinks of alcohol   Drug use: No   Sexual activity: Yes   "

## 2024-09-25 ENCOUNTER — Encounter: Payer: Self-pay | Admitting: Physical Medicine and Rehabilitation

## 2024-09-25 ENCOUNTER — Other Ambulatory Visit: Payer: Self-pay | Admitting: Physical Medicine and Rehabilitation

## 2024-09-25 DIAGNOSIS — M7918 Myalgia, other site: Secondary | ICD-10-CM

## 2024-09-25 DIAGNOSIS — G8929 Other chronic pain: Secondary | ICD-10-CM

## 2024-09-25 DIAGNOSIS — M545 Low back pain, unspecified: Secondary | ICD-10-CM

## 2024-09-29 NOTE — Therapy (Signed)
 " OUTPATIENT PHYSICAL THERAPY THORACOLUMBAR EVALUATION   Patient Name: Jared Griffin MRN: [id number] DOB:January 10, 1961, 64 y.o., male Today's Date: 09/29/2024  END OF SESSION:   Past Medical History:  Diagnosis Date   Anxiety    COPD (chronic obstructive pulmonary disease) (HCC)    Coronary atherosclerosis of native coronary artery    a. 12/09/2013: s/p PCI with 3.0 x 28 Promus DES to mLAD   Depression    Diabetic neuropathy (HCC)    Elbow injury 07/25/2017   Surgery for torn tendon   Essential hypertension    Fatty liver disease, nonalcoholic    GERD (gastroesophageal reflux disease)    Iron deficiency anemia 08/15/2021   Lateral epicondylitis of right elbow    Mixed hyperlipidemia    Obesity    Osgood-Schlatter's disease of right knee    Skin cyst 10/03/2017   Removed from back   Type 2 diabetes mellitus (HCC)    Past Surgical History:  Procedure Laterality Date   BIOPSY  01/21/2019   Procedure: BIOPSY;  Surgeon: Golda Claudis PENNER, MD;  Location: [facility];  Service: Endoscopy;;  gastric bx and gastric polyps   BIOPSY  11/08/2020   Procedure: BIOPSY;  Surgeon: Eartha Angelia Sieving, MD;  Location: [facility];  Service: Gastroenterology;;   CARDIAC CATHETERIZATION  2011   CARDIAC CATHETERIZATION N/A 09/20/2015   Procedure: Left Heart Cath and Coronary Angiography;  Surgeon: Lonni JONETTA Cash, MD;  Location: [facility] INVASIVE CV LAB;  Service: Cardiovascular;  Laterality: N/A;   CHOLECYSTECTOMY  2003   COLONOSCOPY  06/19/2012   Procedure: COLONOSCOPY;  Surgeon: Claudis PENNER Golda, MD;  Location: [facility];  Service: Endoscopy;  Laterality: N/A;  930   COLONOSCOPY N/A 10/17/2017   Procedure: COLONOSCOPY;  Surgeon: Golda Claudis PENNER, MD;  [facility]: [facility];  Service: Endoscopy;  Laterality: N/A;  1225   COLONOSCOPY WITH PROPOFOL  N/A 11/08/2020   Procedure: COLONOSCOPY WITH PROPOFOL ;  Surgeon: Eartha Angelia Sieving, MD;  [facility]: [facility];  Service:  Gastroenterology;  Laterality: N/A;  10:45   COLONOSCOPY WITH PROPOFOL  N/A 04/04/2021   Procedure: COLONOSCOPY WITH PROPOFOL ;  Surgeon: Eartha Angelia Sieving, MD;  [facility]: [facility];  Service: Gastroenterology;  Laterality: N/A;   CORONARY PRESSURE/FFR STUDY N/A 04/10/2022   Procedure: INTRAVASCULAR PRESSURE WIRE/FFR STUDY;  Surgeon: Claudene Victory ORN, MD;  Location: [facility]VAS[facility] CV LAB;  Service: Cardiovascular;  Laterality: N/A;   CYST EXCISION  07/2019   ESOPHAGOGASTRODUODENOSCOPY (EGD) WITH PROPOFOL  N/A 01/21/2019   Procedure: ESOPHAGOGASTRODUODENOSCOPY (EGD) WITH PROPOFOL ;  Surgeon: Golda Claudis PENNER, MD;  [facility]: [facility];  Service: Endoscopy;  Laterality: N/A;  10:15am   ESOPHAGOGASTRODUODENOSCOPY (EGD) WITH PROPOFOL  N/A 02/17/2021   Procedure: ESOPHAGOGASTRODUODENOSCOPY (EGD) WITH PROPOFOL ;  Surgeon: Eartha Angelia Sieving, MD;  [facility]: [facility];  Service: Gastroenterology;  Laterality: N/A;  10:20   ESOPHAGOGASTRODUODENOSCOPY (EGD) WITH PROPOFOL  N/A 04/04/2021   Procedure: ESOPHAGOGASTRODUODENOSCOPY (EGD) WITH PROPOFOL ;  Surgeon: Eartha Angelia Sieving, MD;  [facility]: [facility];  Service: Gastroenterology;  Laterality: N/A;  12:30   GIVENS CAPSULE STUDY N/A 09/13/2021   Procedure: GIVENS CAPSULE STUDY;  Surgeon: Eartha Angelia Sieving, MD;  [facility]: [facility];  Service: Gastroenterology;  Laterality: N/A;  7:30   KNEE ARTHROPLASTY Right 1999   LEFT HEART CATH AND CORONARY ANGIOGRAPHY N/A 02/26/2018   Procedure: LEFT HEART CATH AND CORONARY ANGIOGRAPHY;  Surgeon: Dann Candyce RAMAN, MD;  Location: [facility] INVASIVE CV LAB;  Service: Cardiovascular;  Laterality: N/A;  LEFT HEART CATH AND CORONARY ANGIOGRAPHY N/A 04/10/2022   Procedure: LEFT HEART CATH AND CORONARY ANGIOGRAPHY;  Surgeon: Claudene Victory ORN, MD;  Location: [facility]VAS[facility] CV LAB;  Service: Cardiovascular;  Laterality: N/A;   LEFT HEART CATHETERIZATION WITH CORONARY ANGIOGRAM N/A 12/09/2013   Procedure: LEFT  HEART CATHETERIZATION WITH CORONARY ANGIOGRAM;  Surgeon: Candyce GORMAN Reek, MD;  Location: [facility] CATH LAB;  Service: Cardiovascular;  Laterality: N/A;   Liver biopsy     POLYPECTOMY  10/17/2017   Procedure: POLYPECTOMY;  Surgeon: Golda Claudis PENNER, MD;  Location: [facility];  Service: Endoscopy;;  ascending colon, splenic flexure,sigmoid x2   POLYPECTOMY  11/08/2020   Procedure: POLYPECTOMY;  Surgeon: Eartha Angelia Sieving, MD;  Location: [facility];  Service: Gastroenterology;;   POLYPECTOMY  04/04/2021   Procedure: POLYPECTOMY;  Surgeon: Eartha Angelia Sieving, MD;  Location: [facility];  Service: Gastroenterology;;   TENNIS ELBOW RELEASE/NIRSCHEL PROCEDURE Right 07/25/2017   Procedure: TENNIS ELBOW RELEASE and debriedment;  Surgeon: Margrette Taft BRAVO, MD;  Location: [facility];  Service: Orthopedics;  Laterality: Right;   TONSILLECTOMY  1970's   Patient Active Problem List   Diagnosis Date Noted   Chronic obstructive pulmonary disease, unspecified COPD type (HCC) 06/19/2023   Aortic atherosclerosis 01/17/2023   Dysphagia 12/17/2022   Moderate single current episode of major depressive disorder (HCC) 09/26/2022   Coronary artery disease involving native coronary artery of native heart with angina pectoris 09/26/2022   Atypical chest pain 03/06/2022   Diarrhea 12/14/2021   Iron deficiency anemia 08/15/2021   Fever 03/15/2021   Lactate blood increase 03/15/2021   Sepsis (HCC) 03/15/2021   Tachycardia 03/15/2021   Orthostatic hypotension 12/07/2020   IBS (irritable bowel syndrome) 11/17/2020   Chronic diarrhea 08/17/2020   NASH (nonalcoholic steatohepatitis) 08/17/2020   Elevated LFTs 08/17/2020   Tremor of both hands 05/06/2020   Bilateral occipital neuralgia 05/06/2020   Abdominal pain, epigastric 01/13/2019   Anxiety 01/04/2019   Radicular pain of left lower extremity 12/22/2018   Moderate persistent asthma 10/25/2018   Dizziness 07/12/2018   Nonintractable headache     Abnormal nuclear stress test    Aftercare following surgery 07/25/17 08/13/2017   Hx of colonic polyps 08/07/2017   Lateral epicondylitis of right elbow    Cervical nerve root impingement 12/09/2015   Generalized anxiety disorder 09/27/2015   Pain in the chest    Depression 01/24/2015   Esophageal reflux 01/24/2015   Preoperative cardiovascular examination 06/28/2014   DM type 2 causing vascular disease (HCC)    Mixed hyperlipidemia    Obesity    Intermediate coronary syndrome (HCC) 12/09/2013   Lumbago 05/21/2012   Essential hypertension, benign 12/28/2009   CAD S/P LAD DES March 2015 12/28/2009   Hyperlipidemia LDL goal <70 11/25/2009   Accelerating angina (HCC) 11/25/2009   DM2 (diabetes mellitus, type 2) (HCC) 11/24/2009   ABDOMINAL PAIN, HX OF 11/24/2009    PCP: ***  REFERRING PROVIDER: ***  REFERRING DIAG: ***  Rationale for Evaluation and Treatment: {HABREHAB:27488}  THERAPY DIAG:  No diagnosis found.  ONSET DATE: ***  SUBJECTIVE:  SUBJECTIVE STATEMENT: ***  PERTINENT HISTORY:  ***  PAIN:  NPRS scale: ***/10 Pain location: *** Pain description: *** Aggravating factors: *** Relieving factors: ***  PRECAUTIONS: {Therapy precautions:24002}  WEIGHT BEARING RESTRICTIONS: {Yes ***/No:24003}  FALLS:  Has patient fallen in last 6 months? {fallsyesno:27318}  LIVING ENVIRONMENT: Lives with: {[facility] lives with:25569::lives with their family} Lives in: {Lives in:25570} Stairs: {opstairs:27293} Has following equipment at home: {Assistive devices:23999}  OCCUPATION: ***  PLOF: Independent  PATIENT GOALS: ***  Next MD Visit:    OBJECTIVE:   DIAGNOSTIC FINDINGS:  See chart in epic.  MRI in 2024 most recent testing.   IMPRESSION: 1. Progressive leftward disc  protrusion at L4-5 with mild left subarticular and foraminal stenosis. 2. New far left lateral disc protrusion at L2-3 without focal stenosis. 3. Far left annular tear at L1-2 is new since the prior study. No compressive stenosis is present.  PATIENT SURVEYS:  Patient-Specific Activity Scoring Scheme  0 represents unable to perform. 10 represents able to perform at prior level. 0 1 2 3 4 5 6 7 8 9  10 (Date and Score)   Activity Eval  09/30/2024    1. ***      2. ***      3. ***    4.    5.    Score ***    Total score = sum of the activity scores/number of activities Minimum detectable change (90%CI) for average score = 2 points Minimum detectable change (90%CI) for single activity score = 3 points  SCREENING FOR RED FLAGS: 09/30/2024 Bowel or bladder incontinence: {Bzd/Wn:695039105} Cauda equina syndrome: {Bzd/Wn:695039105}  COGNITION: 09/30/2024 Overall cognitive status: WFL normal      SENSATION: 09/30/2024 {sensation:27233}  MUSCLE LENGTH: 09/30/2024 Hamstrings: Right *** deg; Left *** deg Debby test: Right *** deg; Left *** deg  POSTURE:  09/30/2024 {posture:25561}  PALPATION: 09/30/2024 ***  LUMBAR ROM:  09/30/2024 Directional Preference Assessment: Centralization: Peripheralization:   AROM Eval 09/30/2024  Flexion   Extension   Right lateral flexion   Left lateral flexion   Right rotation   Left rotation    (Blank rows = not tested)  LOWER EXTREMITY ROM:      Right Eval 09/30/2024 Left Eval 09/30/2024  Hip flexion    Hip extension    Hip abduction    Hip adduction    Hip internal rotation    Hip external rotation    Knee flexion    Knee extension    Ankle dorsiflexion    Ankle plantarflexion    Ankle inversion    Ankle eversion     (Blank rows = not tested)  LOWER EXTREMITY MMT:    MMT Right Eval 09/30/2024 Left Eval 09/30/2024  Hip flexion    Hip extension    Hip abduction    Hip adduction    Hip internal rotation     Hip external rotation    Knee flexion    Knee extension    Ankle dorsiflexion    Ankle plantarflexion    Ankle inversion    Ankle eversion     (Blank rows = not tested)  LUMBAR SPECIAL TESTS:  09/30/2024 {lumbar special test:25242}  FUNCTIONAL TESTS:  09/30/2024 {Functional tests:24029}  GAIT: 09/30/2024  TODAY'S TREATMENT:                                                                                                         DATE: ***  Therex:    HEP instruction/performance c cues for techniques, handout provided.  Trial set performed of each for comprehension and symptom assessment.  See below for exercise list  PATIENT EDUCATION:  Education details: HEP, POC Person educated: Patient Education method: Explanation, Demonstration, Verbal cues, and Handouts Education comprehension: verbalized understanding, returned demonstration, and verbal cues required  HOME EXERCISE PROGRAM: ***  ASSESSMENT:  CLINICAL IMPRESSION: Patient is a 64 y.o. who comes to clinic with complaints of ***pain with mobility, strength and movement coordination deficits that impair their ability to perform usual daily and recreational functional activities without increase difficulty/symptoms at this time.  Patient to benefit from skilled PT services to address impairments and limitations to improve to previous level of function without restriction secondary to condition.   OBJECTIVE IMPAIRMENTS: {opptimpairments:25111}.   ACTIVITY LIMITATIONS: {activitylimitations:27494}  PARTICIPATION LIMITATIONS: {participationrestrictions:25113}  PERSONAL FACTORS: {Personal factors:25162} are also affecting patient's functional outcome.   REHAB POTENTIAL: {rehabpotential:25112}  CLINICAL DECISION MAKING:  {clinical decision making:25114}  EVALUATION COMPLEXITY: {Evaluation complexity:25115}   GOALS: Goals reviewed with patient? Yes  SHORT TERM GOALS: (target date for Short term goals are 3 weeks 10/21/2024)  1. Patient will demonstrate independent use of home exercise program to maintain progress from in clinic treatments.  Goal status: New  LONG TERM GOALS: (target dates for all long term goals are 10 weeks  12/09/2024 )   1. Patient will demonstrate/report pain at worst less than or equal to 2/10 to facilitate minimal limitation in daily activity secondary to pain symptoms.  Goal status: New   2. Patient will demonstrate independent use of home exercise program to facilitate ability to maintain/progress functional gains from skilled physical therapy services.  Goal status: New   3. Patient will demonstrate Patient specific functional scale avg > or = *** to indicate reduced disability due to condition.   Goal status: New   4. Patient will demonstrate lumbar extension 100 % WFL s symptoms to facilitate upright standing, walking posture at PLOF s limitation.  Goal status: New   5.  ***  Goal status: New   6.  *** Goal status: New   7.  *** Goal Status: New  PLAN:  PT FREQUENCY: 1-2x/week  PT DURATION: 10 weeks  PLANNED INTERVENTIONS: Can include 02853- PT Re-evaluation, 97110-Therapeutic exercises, 97530- Therapeutic activity, [id number]- Neuromuscular re-education, 97535- Self Care, 97140- Manual therapy, [phone]- Gait training, [phone]- Orthotic Fit/training, [phone]- Canalith repositioning, [id number]- Aquatic Therapy, [phone]- Electrical stimulation (unattended), [id number] Physical performance testing, 97016- Vasopneumatic device, [id number]- Ultrasound, [id number]- Traction (mechanical), [id number]- Ionotophoresis 4mg /ml Dexamethasone ,  79439 - Needle insertion w/o injection 1 or 2 muscles, 20561 - Needle insertion w/o injection 3 or more muscles.    Patient/Family education, Balance training, Stair  training, Taping, Dry Needling, Joint mobilization, Joint manipulation, Spinal manipulation, Spinal mobilization, Scar mobilization, Vestibular training, Visual/preceptual remediation/compensation, DME instructions, Cryotherapy, and Moist heat.  All performed as medically necessary.  All included unless contraindicated  PLAN FOR NEXT SESSION: Review HEP knowledge/results.   Ozell Silvan, PT, DPT, OCS, ATC 09/29/2024  2:37 PM     "

## 2024-09-30 ENCOUNTER — Encounter: Payer: Self-pay | Admitting: Rehabilitative and Restorative Service Providers"

## 2024-09-30 ENCOUNTER — Ambulatory Visit: Admitting: Rehabilitative and Restorative Service Providers"

## 2024-09-30 DIAGNOSIS — M546 Pain in thoracic spine: Secondary | ICD-10-CM

## 2024-09-30 DIAGNOSIS — R293 Abnormal posture: Secondary | ICD-10-CM | POA: Diagnosis not present

## 2024-09-30 DIAGNOSIS — M5459 Other low back pain: Secondary | ICD-10-CM | POA: Diagnosis not present

## 2024-10-01 ENCOUNTER — Other Ambulatory Visit: Payer: Self-pay | Admitting: Family Medicine

## 2024-10-02 MED ORDER — KETOROLAC TROMETHAMINE 30 MG/ML IJ SOLN
60.0000 mg | INTRAMUSCULAR | Status: AC | PRN
  Administered 2024-09-24: 60 mg via INTRAMUSCULAR

## 2024-10-06 ENCOUNTER — Other Ambulatory Visit: Payer: Self-pay | Admitting: Family Medicine

## 2024-10-06 ENCOUNTER — Encounter: Admitting: Physical Therapy

## 2024-10-07 ENCOUNTER — Encounter: Admitting: Physical Therapy

## 2024-10-08 ENCOUNTER — Encounter: Payer: Self-pay | Admitting: Physical Therapy

## 2024-10-08 ENCOUNTER — Encounter

## 2024-10-08 ENCOUNTER — Ambulatory Visit: Admitting: Physical Therapy

## 2024-10-08 DIAGNOSIS — R293 Abnormal posture: Secondary | ICD-10-CM | POA: Diagnosis not present

## 2024-10-08 DIAGNOSIS — M546 Pain in thoracic spine: Secondary | ICD-10-CM | POA: Diagnosis not present

## 2024-10-08 DIAGNOSIS — M5459 Other low back pain: Secondary | ICD-10-CM | POA: Diagnosis not present

## 2024-10-08 NOTE — Patient Instructions (Signed)

## 2024-10-08 NOTE — Therapy (Signed)
 " OUTPATIENT PHYSICAL THERAPY TREATMENT   Patient Name: Jared Griffin MRN: [id number] DOB:01-09-1961, 64 y.o., male Today's Date: 10/08/2024  END OF SESSION:  PT End of Session - 10/08/24 1144     Visit Number 2    Number of Visits 20    Date for Recertification  12/09/24    Authorization Type Health team $15 copay    PT Start Time 1145    PT Stop Time 1225    PT Time Calculation (min) 40 min    Activity Tolerance Patient limited by pain    Behavior During Therapy [facility] for tasks assessed/performed           Past Medical History:  Diagnosis Date   Anxiety    COPD (chronic obstructive pulmonary disease) (HCC)    Coronary atherosclerosis of native coronary artery    a. 12/09/2013: s/p PCI with 3.0 x 28 Promus DES to mLAD   Depression    Diabetic neuropathy (HCC)    Elbow injury 07/25/2017   Surgery for torn tendon   Essential hypertension    Fatty liver disease, nonalcoholic    GERD (gastroesophageal reflux disease)    Iron deficiency anemia 08/15/2021   Lateral epicondylitis of right elbow    Mixed hyperlipidemia    Obesity    Osgood-Schlatter's disease of right knee    Skin cyst 10/03/2017   Removed from back   Type 2 diabetes mellitus (HCC)    Past Surgical History:  Procedure Laterality Date   BIOPSY  01/21/2019   Procedure: BIOPSY;  Surgeon: Golda Claudis PENNER, MD;  [facility];  Service: Endoscopy;;  gastric bx and gastric polyps   BIOPSY  11/08/2020   Procedure: BIOPSY;  Surgeon: Eartha Angelia Sieving, MD;  [facility]: [facility];  Service: Gastroenterology;;   CARDIAC CATHETERIZATION  2011   CARDIAC CATHETERIZATION N/A 09/20/2015   Procedure: Left Heart Cath and Coronary Angiography;  Surgeon: Lonni JONETTA Cash, MD;  Location: [facility] INVASIVE CV LAB;  Service: Cardiovascular;  Laterality: N/A;   CHOLECYSTECTOMY  2003   COLONOSCOPY  06/19/2012   Procedure: COLONOSCOPY;  Surgeon: Claudis PENNER Golda, MD;  [facility]: [facility];  Service: Endoscopy;   Laterality: N/A;  930   COLONOSCOPY N/A 10/17/2017   Procedure: COLONOSCOPY;  Surgeon: Golda Claudis PENNER, MD;  [facility];  Service: Endoscopy;  Laterality: N/A;  1225   COLONOSCOPY WITH PROPOFOL  N/A 11/08/2020   Procedure: COLONOSCOPY WITH PROPOFOL ;  Surgeon: Eartha Angelia Sieving, MD;  [facility];  Service: Gastroenterology;  Laterality: N/A;  10:45   COLONOSCOPY WITH PROPOFOL  N/A 04/04/2021   Procedure: COLONOSCOPY WITH PROPOFOL ;  Surgeon: Eartha Angelia Sieving, MD;  [facility];  Service: Gastroenterology;  Laterality: N/A;   CORONARY PRESSURE/FFR STUDY N/A 04/10/2022   Procedure: INTRAVASCULAR PRESSURE WIRE/FFR STUDY;  Surgeon: Claudene Victory ORN, MD;  Location: [facility]VAS[facility] CV LAB;  Service: Cardiovascular;  Laterality: N/A;   CYST EXCISION  07/2019   ESOPHAGOGASTRODUODENOSCOPY (EGD) WITH PROPOFOL  N/A 01/21/2019   Procedure: ESOPHAGOGASTRODUODENOSCOPY (EGD) WITH PROPOFOL ;  Surgeon: Golda Claudis PENNER, MD;  [facility];  Service: Endoscopy;  Laterality: N/A;  10:15am   ESOPHAGOGASTRODUODENOSCOPY (EGD) WITH PROPOFOL  N/A 02/17/2021   Procedure: ESOPHAGOGASTRODUODENOSCOPY (EGD) WITH PROPOFOL ;  Surgeon: Eartha Angelia Sieving, MD;  [facility];  Service: Gastroenterology;  Laterality: N/A;  10:20   ESOPHAGOGASTRODUODENOSCOPY (EGD) WITH PROPOFOL  N/A 04/04/2021   Procedure: ESOPHAGOGASTRODUODENOSCOPY (EGD) WITH PROPOFOL ;  Surgeon: Eartha Angelia Sieving, MD;  [facility];  Service: Gastroenterology;  Laterality: N/A;  12:30   GIVENS CAPSULE STUDY N/A 09/13/2021   Procedure: GIVENS CAPSULE STUDY;  Surgeon: Eartha Angelia Sieving, MD;  [facility];  Service: Gastroenterology;  Laterality: N/A;  7:30   KNEE ARTHROPLASTY Right 1999   LEFT HEART CATH AND CORONARY ANGIOGRAPHY N/A 02/26/2018   Procedure: LEFT HEART CATH AND CORONARY ANGIOGRAPHY;  Surgeon: Dann Candyce RAMAN, MD;  Location: [facility] INVASIVE CV LAB;  Service:  Cardiovascular;  Laterality: N/A;   LEFT HEART CATH AND CORONARY ANGIOGRAPHY N/A 04/10/2022   Procedure: LEFT HEART CATH AND CORONARY ANGIOGRAPHY;  Surgeon: Claudene Victory ORN, MD;  Location: [facility]VAS[facility] CV LAB;  Service: Cardiovascular;  Laterality: N/A;   LEFT HEART CATHETERIZATION WITH CORONARY ANGIOGRAM N/A 12/09/2013   Procedure: LEFT HEART CATHETERIZATION WITH CORONARY ANGIOGRAM;  Surgeon: Candyce RAMAN Dann, MD;  Location: [facility] CATH LAB;  Service: Cardiovascular;  Laterality: N/A;   Liver biopsy     POLYPECTOMY  10/17/2017   Procedure: POLYPECTOMY;  Surgeon: Golda Claudis PENNER, MD;  [facility];  Service: Endoscopy;;  ascending colon, splenic flexure,sigmoid x2   POLYPECTOMY  11/08/2020   Procedure: POLYPECTOMY;  Surgeon: Eartha Angelia Sieving, MD;  [facility];  Service: Gastroenterology;;   POLYPECTOMY  04/04/2021   Procedure: POLYPECTOMY;  Surgeon: Eartha Angelia Sieving, MD;  [facility];  Service: Gastroenterology;;   TENNIS ELBOW RELEASE/NIRSCHEL PROCEDURE Right 07/25/2017   Procedure: TENNIS ELBOW RELEASE and debriedment;  Surgeon: Margrette Taft BRAVO, MD;  Location: [facility];  Service: Orthopedics;  Laterality: Right;   TONSILLECTOMY  1970's   Patient Active Problem List   Diagnosis Date Noted   Chronic obstructive pulmonary disease, unspecified COPD type (HCC) 06/19/2023   Aortic atherosclerosis 01/17/2023   Dysphagia 12/17/2022   Moderate single current episode of major depressive disorder (HCC) 09/26/2022   Coronary artery disease involving native coronary artery of native heart with angina pectoris 09/26/2022   Atypical chest pain 03/06/2022   Diarrhea 12/14/2021   Iron deficiency anemia 08/15/2021   Fever 03/15/2021   Lactate blood increase 03/15/2021   Sepsis (HCC) 03/15/2021   Tachycardia 03/15/2021   Orthostatic hypotension 12/07/2020   IBS (irritable bowel syndrome) 11/17/2020   Chronic diarrhea 08/17/2020   NASH (nonalcoholic  steatohepatitis) 08/17/2020   Elevated LFTs 08/17/2020   Tremor of both hands 05/06/2020   Bilateral occipital neuralgia 05/06/2020   Abdominal pain, epigastric 01/13/2019   Anxiety 01/04/2019   Radicular pain of left lower extremity 12/22/2018   Moderate persistent asthma 10/25/2018   Dizziness 07/12/2018   Nonintractable headache    Abnormal nuclear stress test    Aftercare following surgery 07/25/17 08/13/2017   Hx of colonic polyps 08/07/2017   Lateral epicondylitis of right elbow    Cervical nerve root impingement 12/09/2015   Generalized anxiety disorder 09/27/2015   Pain in the chest    Depression 01/24/2015   Esophageal reflux 01/24/2015   Preoperative cardiovascular examination 06/28/2014   DM type 2 causing vascular disease (HCC)    Mixed hyperlipidemia    Obesity    Intermediate coronary syndrome (HCC) 12/09/2013   Lumbago 05/21/2012   Essential hypertension, benign 12/28/2009   CAD S/P LAD DES March 2015 12/28/2009   Hyperlipidemia LDL goal <70 11/25/2009   Accelerating angina (HCC) 11/25/2009   DM2 (diabetes mellitus, type 2) (HCC) 11/24/2009   ABDOMINAL PAIN, HX OF 11/24/2009    PCP: Alphonsa Glendia LABOR, MD  REFERRING PROVIDER: Williams, Megan E,  NP  REFERRING DIAG: Diagnosis M54.6,G89.29 (ICD-10-CM) - Chronic right-sided thoracic back pain M54.50,G89.29 (ICD-10-CM) - Chronic right-sided low back pain without sciatica M79.18 (ICD-10-CM) - Myofascial pain syndrome  Rationale for Evaluation and Treatment: Rehabilitation  THERAPY DIAG:  Other low back pain  Pain in thoracic spine  Abnormal posture  ONSET DATE: 08/2024 onset  SUBJECTIVE:                                                                                                                                                                                           SUBJECTIVE STATEMENT: Doing pretty well; still having some pain in one spot but everywhere else feels pretty good.  PERTINENT HISTORY:   Anxiety, COPD, depression, Diabetic neuropathy  PAIN:  NPRS scale: at worst 10+/10 Pain location: mid and lower back, Rt > Lt.  Pain description: pressure in back Aggravating factors: prolonged positioning, moving, lifting,carrying Relieving factors: heating  PRECAUTIONS: None  WEIGHT BEARING RESTRICTIONS: No  FALLS:  Has patient fallen in last 6 months? No  LIVING ENVIRONMENT: Lives in: House/apartment Stairs: Stairs to enter, 12 on side, 8 on front.  With rails.   OCCUPATION: Retired  PLOF: Independent, yard work, armed forces training and education officer.   PATIENT GOALS: Reduce pain, wants to reduce weight  OBJECTIVE:   DIAGNOSTIC FINDINGS:  See chart in epic.  MRI in 2024 most recent testing.   IMPRESSION: 1. Progressive leftward disc protrusion at L4-5 with mild left subarticular and foraminal stenosis. 2. New far left lateral disc protrusion at L2-3 without focal stenosis. 3. Far left annular tear at L1-2 is new since the prior study. No compressive stenosis is present.  PATIENT SURVEYS:  Patient-Specific Activity Scoring Scheme  0 represents unable to perform. 10 represents able to perform at prior level. 0 1 2 3 4 5 6 7 8 9  10 (Date and Score)   Activity Eval  09/30/2024    1. Bending over   2    2. Picking up items  0    3. Getting up/down  3   4. Walking  3   5.    Score 2 avg    Total score = sum of the activity scores/number of activities Minimum detectable change (90%CI) for average score = 2 points Minimum detectable change (90%CI) for single activity score = 3 points  SCREENING FOR RED FLAGS: 09/30/2024 Bowel or bladder incontinence: No Cauda equina syndrome: No  COGNITION: 09/30/2024 Overall cognitive status: WFL normal      SENSATION: 09/30/2024 WFL  MUSCLE LENGTH: 09/30/2024 No measurements  POSTURE:  09/30/2024 Reduced lumbar lordosis in standing.   PALPATION: 09/30/2024 Muscle tightness and guarding  ROM:  09/30/2024 Directional  Preference Assessment: Centralization:not observed  Peripheralization: not observed   AROM Eval 09/30/2024  Lumbar Flexion To mid shin with quick pain, gowers sign upon return. Painful arc  Lumbar Extension 50% WFL  X 5 repeated in standing , improved to 100 %  Lumbar Lt lateral flexion To femoral epicondyle with hinge at mid thoracic (minimal movement lower)  c pain  Lumbar  Rt lateral flexion To femoral epicondyle with hinge at mid thoracic (minimal movement lower)  c pain  Thoracic Right rotation 40 c pain  Thoracic Left rotation 35 c pain  Thoracic extension < 25 % with pain  Thoracic flexion  < 25 % with pain   (Blank rows = not tested)  LOWER EXTREMITY ROM:     09/30/2024: No hip ROM impact on back symptoms.    Right Eval 09/30/2024 Left Eval 09/30/2024  Hip flexion    Hip extension    Hip abduction    Hip adduction    Hip internal rotation    Hip external rotation    Knee flexion    Knee extension    Ankle dorsiflexion    Ankle plantarflexion    Ankle inversion    Ankle eversion     (Blank rows = not tested)  LOWER EXTREMITY MMT:    MMT Right Eval 09/30/2024 Left Eval 09/30/2024  Hip flexion 5/5 5/5  Hip extension    Hip abduction    Hip adduction    Hip internal rotation    Hip external rotation    Knee flexion 5/5 5/5  Knee extension 5/5 5/5  Ankle dorsiflexion 5/5 5/5  Ankle plantarflexion    Ankle inversion    Ankle eversion     (Blank rows = not tested)  SPECIAL TESTS:  09/30/2024 (-) slump  FUNCTIONAL TESTS:  09/30/2024 18 inch chair with hands on knees with pain noted, increased difficulty   GAIT: 09/30/2024 Independent ambulation                                                                                                                                                                                                                    TODAY'S TREATMENT 10/08/24 Seated forward flexion with stool to mid and Rt (for Lt side stretch) 5  x 10 sec each Verbal review of HEP and encouraged pt to continue with current HEP - verbalized no concerns or questions  Manual STM with compression to bil mid thoracic paraspinals; skilled palpation and monitoring of soft  tissue during dry needling  Trigger Point Dry Needling  Initial Treatment: Pt instructed on Dry Needling rational, procedures, and possible side effects. Pt instructed to expect mild to moderate muscle soreness later in the day and/or into the next day.  Pt instructed in methods to reduce muscle soreness. Pt instructed to continue prescribed HEP. Because Dry Needling was performed over or adjacent to a lung field, pt was educated on S/S of pneumothorax and to seek immediate medical attention should they occur.  Patient was educated on signs and symptoms of infection and other risk factors and advised to seek medical attention should they occur.  Patient verbalized understanding of these instructions and education.   Patient Verbal Consent Given: Yes Education Handout Provided: Yes Muscles Treated: bil thoracic paraspinals Electrical Stimulation Performed: No Treatment Response/Outcome: twitch responses noted on Lt; decreased pain and tightness following     09/30/2024  Therex:    HEP instruction/performance c cues for techniques, handout provided.  Trial set performed of each for comprehension and symptom assessment.  See below for exercise list.   Discussed importance of mobility gains to help improve stiffness in joint and muscle movement in back.  Discussed benefits of walking, bike as able based off symptom presentation.      PATIENT EDUCATION:  09/30/2024 Education details: HEP, POC Person educated: Patient Education method: Programmer, Multimedia, Demonstration, Verbal cues, and Handouts Education comprehension: verbalized understanding, returned demonstration, and verbal cues required  HOME EXERCISE PROGRAM: Access Code: [email or web address] URL:  [email or web address] Date: 09/30/2024 Prepared by: Ozell Silvan  Exercises - Standing Lumbar Extension  - 2-4 x daily - 7 x weekly - 1 sets - 5 reps - Shoulder External Rotation and Scapular Retraction  - 3-5 x daily - 7 x weekly - 1 sets - 5 reps - 2 hold - Supine Lower Trunk Rotation  - 2-3 x daily - 7 x weekly - 1 sets - 3-5 reps - 15 hold - Standing Bilateral Low Shoulder Row with Anchored Resistance  - 1-2 x daily - 7 x weekly - 1-2 sets - 10-15 reps - Shoulder Extension with Resistance  - 1-2 x daily - 7 x weekly - 1-2 sets - 10-15 reps  ASSESSMENT:  CLINICAL IMPRESSION: Pt with initial positive response from DN and reports decreased pain following session.  Overall pain has improved except in one isolated area.  Continue skilled PT at this time.   OBJECTIVE IMPAIRMENTS: decreased activity tolerance, decreased coordination, decreased endurance, decreased mobility, difficulty walking, decreased ROM, hypomobility, increased fascial restrictions, impaired perceived functional ability, increased muscle spasms, impaired flexibility, improper body mechanics, postural dysfunction, and pain.   ACTIVITY LIMITATIONS: carrying, lifting, bending, sitting, standing, sleeping, transfers, bed mobility, reach over head, and locomotion level  PARTICIPATION LIMITATIONS: meal prep, cleaning, laundry, interpersonal relationship, community activity, and yard work  PERSONAL FACTORS: Anxiety, COPD, depression, Diabetic neuropathy are also affecting patient's functional outcome.   REHAB POTENTIAL: Good  CLINICAL DECISION MAKING: Stable/uncomplicated  EVALUATION COMPLEXITY: Low   GOALS: Goals reviewed with patient? Yes  SHORT TERM GOALS: (target date for Short term goals are 3 weeks 10/21/2024)  1. Patient will demonstrate independent use of home exercise program to maintain progress from in clinic treatments.  Goal status: New  LONG TERM GOALS: (target dates for all long term  goals are 10 weeks  12/09/2024 )   1. Patient will demonstrate/report pain at worst less than or equal to 2/10 to facilitate minimal limitation in daily activity secondary to pain symptoms.  Goal status:  New   2. Patient will demonstrate independent use of home exercise program to facilitate ability to maintain/progress functional gains from skilled physical therapy services.  Goal status: New   3. Patient will demonstrate Patient specific functional scale avg > or = 8/10 to indicate reduced disability due to condition.   Goal status: New   4. Patient will demonstrate lumbar extension 100 % WFL s symptoms to facilitate upright standing, walking posture at PLOF s limitation.  Goal status: New   5.  Patient will demonstrate thoracic ROM WFL s symptoms to facilitate daily mobility at [facility].   Goal status: New     PLAN:  PT FREQUENCY: 1-2x/week  PT DURATION: 10 weeks  PLANNED INTERVENTIONS: Can include 02853- PT Re-evaluation, 97110-Therapeutic exercises, 97530- Therapeutic activity, [id number]- Neuromuscular re-education, 97535- Self Care, 97140- Manual therapy, [phone]- Gait training, [id number]- Orthotic Fit/training, [phone]- Canalith repositioning, [id number]- Aquatic Therapy, [id number]- Electrical stimulation (unattended), [id number] Physical performance testing, 97016- Vasopneumatic device, [id number]- Ultrasound, [id number]- Traction (mechanical), [id number]- Ionotophoresis 4mg /ml Dexamethasone ,  79439 - Needle insertion w/o injection 1 or 2 muscles, 20561 - Needle insertion w/o injection 3 or more muscles.    Patient/Family education, Balance training, Stair training, Taping, Dry Needling, Joint mobilization, Joint manipulation, Spinal manipulation, Spinal mobilization, Scar mobilization, Vestibular training, Visual/preceptual remediation/compensation, DME instructions, Cryotherapy, and Moist heat.  All performed as medically necessary.  All included unless contraindicated  PLAN FOR NEXT SESSION: Review HEP PRN; assess  response to DN and continue PRN, thoracic mobility and postural strengthening   Corean JULIANNA Ku, PT, DPT 10/08/24 12:32 PM      "

## 2024-10-13 ENCOUNTER — Encounter: Admitting: Physical Therapy

## 2024-10-15 ENCOUNTER — Ambulatory Visit: Admitting: Physical Therapy

## 2024-10-15 ENCOUNTER — Encounter: Payer: Self-pay | Admitting: Physical Therapy

## 2024-10-15 ENCOUNTER — Encounter: Admitting: Rehabilitative and Restorative Service Providers"

## 2024-10-15 DIAGNOSIS — M546 Pain in thoracic spine: Secondary | ICD-10-CM | POA: Diagnosis not present

## 2024-10-15 DIAGNOSIS — M5459 Other low back pain: Secondary | ICD-10-CM

## 2024-10-15 DIAGNOSIS — R293 Abnormal posture: Secondary | ICD-10-CM

## 2024-10-15 NOTE — Therapy (Signed)
 " OUTPATIENT PHYSICAL THERAPY TREATMENT   Patient Name: Jared Griffin MRN: [id number] DOB:[birth year], 64 y.o., male Today's Date: 10/15/2024  END OF SESSION:  PT End of Session - 10/15/24 1016     Visit Number 3    Number of Visits 20    Date for Recertification  12/09/24    Authorization Type Health team $15 copay    PT Start Time 1015    PT Stop Time 1050    PT Time Calculation (min) 35 min    Activity Tolerance Patient limited by pain    Behavior During Therapy [facility] for tasks assessed/performed            Past Medical History:  Diagnosis Date   Anxiety    COPD (chronic obstructive pulmonary disease) (HCC)    Coronary atherosclerosis of native coronary artery    a. 12/09/2013: s/p PCI with 3.0 x 28 Promus DES to mLAD   Depression    Diabetic neuropathy (HCC)    Elbow injury 07/25/2017   Surgery for torn tendon   Essential hypertension    Fatty liver disease, nonalcoholic    GERD (gastroesophageal reflux disease)    Iron deficiency anemia 08/15/2021   Lateral epicondylitis of right elbow    Mixed hyperlipidemia    Obesity    Osgood-Schlatter's disease of right knee    Skin cyst 10/03/2017   Removed from back   Type 2 diabetes mellitus (HCC)    Past Surgical History:  Procedure Laterality Date   BIOPSY  01/21/2019   Procedure: BIOPSY;  Surgeon: Golda Claudis PENNER, MD;  [facility];  Service: Endoscopy;;  gastric bx and gastric polyps   BIOPSY  11/08/2020   Procedure: BIOPSY;  Surgeon: Eartha Angelia Sieving, MD;  [facility]: [facility];  Service: Gastroenterology;;   CARDIAC CATHETERIZATION  2011   CARDIAC CATHETERIZATION N/A 09/20/2015   Procedure: Left Heart Cath and Coronary Angiography;  Surgeon: Lonni JONETTA Cash, MD;  Location: [facility] INVASIVE CV LAB;  Service: Cardiovascular;  Laterality: N/A;   CHOLECYSTECTOMY  2003   COLONOSCOPY  06/19/2012   Procedure: COLONOSCOPY;  Surgeon: Claudis PENNER Golda, MD;  [facility]: [facility];  Service:  Endoscopy;  Laterality: N/A;  930   COLONOSCOPY N/A 10/17/2017   Procedure: COLONOSCOPY;  Surgeon: Golda Claudis PENNER, MD;  [facility];  Service: Endoscopy;  Laterality: N/A;  1225   COLONOSCOPY WITH PROPOFOL  N/A 11/08/2020   Procedure: COLONOSCOPY WITH PROPOFOL ;  Surgeon: Eartha Angelia Sieving, MD;  [facility];  Service: Gastroenterology;  Laterality: N/A;  10:45   COLONOSCOPY WITH PROPOFOL  N/A 04/04/2021   Procedure: COLONOSCOPY WITH PROPOFOL ;  Surgeon: Eartha Angelia Sieving, MD;  [facility]: [facility];  Service: Gastroenterology;  Laterality: N/A;   CORONARY PRESSURE/FFR STUDY N/A 04/10/2022   Procedure: INTRAVASCULAR PRESSURE WIRE/FFR STUDY;  Surgeon: Claudene Victory ORN, MD;  Location: [facility] INVASIVE CV LAB;  Service: Cardiovascular;  Laterality: N/A;   CYST EXCISION  07/2019   ESOPHAGOGASTRODUODENOSCOPY (EGD) WITH PROPOFOL  N/A 01/21/2019   Procedure: ESOPHAGOGASTRODUODENOSCOPY (EGD) WITH PROPOFOL ;  Surgeon: Golda Claudis PENNER, MD;  [facility];  Service: Endoscopy;  Laterality: N/A;  10:15am   ESOPHAGOGASTRODUODENOSCOPY (EGD) WITH PROPOFOL  N/A 02/17/2021   Procedure: ESOPHAGOGASTRODUODENOSCOPY (EGD) WITH PROPOFOL ;  Surgeon: Eartha Angelia Sieving, MD;  [facility];  Service: Gastroenterology;  Laterality: N/A;  10:20   ESOPHAGOGASTRODUODENOSCOPY (EGD) WITH PROPOFOL  N/A 04/04/2021   Procedure: ESOPHAGOGASTRODUODENOSCOPY (EGD) WITH PROPOFOL ;  Surgeon: Eartha Angelia Sieving, MD;  [facility];  Service: Gastroenterology;  Laterality: N/A;  12:30   GIVENS CAPSULE STUDY N/A 09/13/2021   Procedure: GIVENS CAPSULE STUDY;  Surgeon: Eartha Angelia Sieving, MD;  [facility];  Service: Gastroenterology;  Laterality: N/A;  7:30   KNEE ARTHROPLASTY Right 1999   LEFT HEART CATH AND CORONARY ANGIOGRAPHY N/A 02/26/2018   Procedure: LEFT HEART CATH AND CORONARY ANGIOGRAPHY;  Surgeon: Dann Candyce RAMAN, MD;  Location: [facility] INVASIVE CV  LAB;  Service: Cardiovascular;  Laterality: N/A;   LEFT HEART CATH AND CORONARY ANGIOGRAPHY N/A 04/10/2022   Procedure: LEFT HEART CATH AND CORONARY ANGIOGRAPHY;  Surgeon: Claudene Victory ORN, MD;  Location: [facility] INVASIVE CV LAB;  Service: Cardiovascular;  Laterality: N/A;   LEFT HEART CATHETERIZATION WITH CORONARY ANGIOGRAM N/A 12/09/2013   Procedure: LEFT HEART CATHETERIZATION WITH CORONARY ANGIOGRAM;  Surgeon: Candyce RAMAN Dann, MD;  Location: [facility] CATH LAB;  Service: Cardiovascular;  Laterality: N/A;   Liver biopsy     POLYPECTOMY  10/17/2017   Procedure: POLYPECTOMY;  Surgeon: Golda Claudis PENNER, MD;  [facility];  Service: Endoscopy;;  ascending colon, splenic flexure,sigmoid x2   POLYPECTOMY  11/08/2020   Procedure: POLYPECTOMY;  Surgeon: Eartha Angelia Sieving, MD;  [facility];  Service: Gastroenterology;;   POLYPECTOMY  04/04/2021   Procedure: POLYPECTOMY;  Surgeon: Eartha Angelia Sieving, MD;  [facility];  Service: Gastroenterology;;   TENNIS ELBOW RELEASE/NIRSCHEL PROCEDURE Right 07/25/2017   Procedure: TENNIS ELBOW RELEASE and debriedment;  Surgeon: Margrette Taft BRAVO, MD;  Location: [facility];  Service: Orthopedics;  Laterality: Right;   TONSILLECTOMY  1970's   Patient Active Problem List   Diagnosis Date Noted   Chronic obstructive pulmonary disease, unspecified COPD type (HCC) 06/19/2023   Aortic atherosclerosis 01/17/2023   Dysphagia 12/17/2022   Moderate single current episode of major depressive disorder (HCC) 09/26/2022   Coronary artery disease involving native coronary artery of native heart with angina pectoris 09/26/2022   Atypical chest pain 03/06/2022   Diarrhea 12/14/2021   Iron deficiency anemia 08/15/2021   Fever 03/15/2021   Lactate blood increase 03/15/2021   Sepsis (HCC) 03/15/2021   Tachycardia 03/15/2021   Orthostatic hypotension 12/07/2020   IBS (irritable bowel syndrome) 11/17/2020   Chronic diarrhea 08/17/2020   NASH  (nonalcoholic steatohepatitis) 08/17/2020   Elevated LFTs 08/17/2020   Tremor of both hands 05/06/2020   Bilateral occipital neuralgia 05/06/2020   Abdominal pain, epigastric 01/13/2019   Anxiety 01/04/2019   Radicular pain of left lower extremity 12/22/2018   Moderate persistent asthma 10/25/2018   Dizziness 07/12/2018   Nonintractable headache    Abnormal nuclear stress test    Aftercare following surgery 07/25/17 08/13/2017   Hx of colonic polyps 08/07/2017   Lateral epicondylitis of right elbow    Cervical nerve root impingement 12/09/2015   Generalized anxiety disorder 09/27/2015   Pain in the chest    Depression 01/24/2015   Esophageal reflux 01/24/2015   Preoperative cardiovascular examination 06/28/2014   DM type 2 causing vascular disease (HCC)    Mixed hyperlipidemia    Obesity    Intermediate coronary syndrome (HCC) 12/09/2013   Lumbago 05/21/2012   Essential hypertension, benign 12/28/2009   CAD S/P LAD DES March 2015 12/28/2009   Hyperlipidemia LDL goal <70 11/25/2009   Accelerating angina (HCC) 11/25/2009   DM2 (diabetes mellitus, type 2) (HCC) 11/24/2009   ABDOMINAL PAIN, HX OF 11/24/2009    PCP: Alphonsa Glendia LABOR, MD  REFERRING PROVIDER: Trudy Bouchard  E, NP  REFERRING DIAG: Diagnosis M54.6,G89.29 (ICD-10-CM) - Chronic right-sided thoracic back pain M54.50,G89.29 (ICD-10-CM) - Chronic right-sided low back pain without sciatica M79.18 (ICD-10-CM) - Myofascial pain syndrome  Rationale for Evaluation and Treatment: Rehabilitation  THERAPY DIAG:  Other low back pain  Pain in thoracic spine  Abnormal posture  ONSET DATE: 08/2024 onset  SUBJECTIVE:                                                                                                                                                                                           SUBJECTIVE STATEMENT: Pain is about the same but has moved up a little higher. Transitional movements and rolling in bed  have been uncomfortable.  PERTINENT HISTORY:  Anxiety, COPD, depression, Diabetic neuropathy  PAIN:  NPRS scale: at worst 10+/10 Pain location: mid and lower back, Rt > Lt.  Pain description: pressure in back Aggravating factors: prolonged positioning, moving, lifting,carrying Relieving factors: heating  PRECAUTIONS: None  WEIGHT BEARING RESTRICTIONS: No  FALLS:  Has patient fallen in last 6 months? No  LIVING ENVIRONMENT: Lives in: House/apartment Stairs: Stairs to enter, 12 on side, 8 on front.  With rails.   OCCUPATION: Retired  PLOF: Independent, yard work, armed forces training and education officer.   PATIENT GOALS: Reduce pain, wants to reduce weight  OBJECTIVE:   DIAGNOSTIC FINDINGS:  See chart in epic.  MRI in 2024 most recent testing.   IMPRESSION: 1. Progressive leftward disc protrusion at L4-5 with mild left subarticular and foraminal stenosis. 2. New far left lateral disc protrusion at L2-3 without focal stenosis. 3. Far left annular tear at L1-2 is new since the prior study. No compressive stenosis is present.  PATIENT SURVEYS:  Patient-Specific Activity Scoring Scheme  0 represents unable to perform. 10 represents able to perform at prior level. 0 1 2 3 4 5 6 7 8 9  10 (Date and Score)   Activity Eval  09/30/2024    1. Bending over   2    2. Picking up items  0    3. Getting up/down  3   4. Walking  3   5.    Score 2 avg    Total score = sum of the activity scores/number of activities Minimum detectable change (90%CI) for average score = 2 points Minimum detectable change (90%CI) for single activity score = 3 points  SCREENING FOR RED FLAGS: 09/30/2024 Bowel or bladder incontinence: No Cauda equina syndrome: No  COGNITION: 09/30/2024 Overall cognitive status: WFL normal      SENSATION: 09/30/2024 WFL  MUSCLE LENGTH: 09/30/2024 No measurements  POSTURE:  09/30/2024 Reduced lumbar lordosis in standing.   PALPATION:  09/30/2024 Muscle tightness and  guarding  ROM:  09/30/2024 Directional Preference Assessment: Centralization:not observed  Peripheralization: not observed   AROM Eval 09/30/2024  Lumbar Flexion To mid shin with quick pain, gowers sign upon return. Painful arc  Lumbar Extension 50% WFL  X 5 repeated in standing , improved to 100 %  Lumbar Lt lateral flexion To femoral epicondyle with hinge at mid thoracic (minimal movement lower)  c pain  Lumbar  Rt lateral flexion To femoral epicondyle with hinge at mid thoracic (minimal movement lower)  c pain  Thoracic Right rotation 40 c pain  Thoracic Left rotation 35 c pain  Thoracic extension < 25 % with pain  Thoracic flexion  < 25 % with pain   (Blank rows = not tested)  LOWER EXTREMITY ROM:     09/30/2024: No hip ROM impact on back symptoms.    Right Eval 09/30/2024 Left Eval 09/30/2024  Hip flexion    Hip extension    Hip abduction    Hip adduction    Hip internal rotation    Hip external rotation    Knee flexion    Knee extension    Ankle dorsiflexion    Ankle plantarflexion    Ankle inversion    Ankle eversion     (Blank rows = not tested)  LOWER EXTREMITY MMT:    MMT Right Eval 09/30/2024 Left Eval 09/30/2024  Hip flexion 5/5 5/5  Hip extension    Hip abduction    Hip adduction    Hip internal rotation    Hip external rotation    Knee flexion 5/5 5/5  Knee extension 5/5 5/5  Ankle dorsiflexion 5/5 5/5  Ankle plantarflexion    Ankle inversion    Ankle eversion     (Blank rows = not tested)  SPECIAL TESTS:  09/30/2024 (-) slump  FUNCTIONAL TESTS:  09/30/2024 18 inch chair with hands on knees with pain noted, increased difficulty   GAIT: 09/30/2024 Independent ambulation                                                                                                                                                                                                                    TODAY'S TREATMENT 10/15/24 Manual STM with compression  to bil mid thoracic paraspinals; skilled palpation and monitoring of soft tissue during dry needling IASTM with percussive device to Lt Mid thoracic paraspinals  Trigger Point Dry Needling  Subsequent Treatment: Instructions provided previously at initial dry needling treatment.  Patient Verbal Consent Given: Yes Education Handout Provided: Yes Muscles Treated: Lt thoracic paraspinals, Lt lats Electrical Stimulation Performed: No Treatment Response/Outcome: twitch responses noted on Lt; decreased pain and tightness following   10/08/24 Seated forward flexion with stool to mid and Rt (for Lt side stretch) 5 x 10 sec each Verbal review of HEP and encouraged pt to continue with current HEP - verbalized no concerns or questions    Manual STM with compression to bil mid thoracic paraspinals; skilled palpation and monitoring of soft tissue during dry needling  Trigger Point Dry Needling  Initial Treatment: Pt instructed on Dry Needling rational, procedures, and possible side effects. Pt instructed to expect mild to moderate muscle soreness later in the day and/or into the next day.  Pt instructed in methods to reduce muscle soreness. Pt instructed to continue prescribed HEP. Because Dry Needling was performed over or adjacent to a lung field, pt was educated on S/S of pneumothorax and to seek immediate medical attention should they occur.  Patient was educated on signs and symptoms of infection and other risk factors and advised to seek medical attention should they occur.  Patient verbalized understanding of these instructions and education.   Patient Verbal Consent Given: Yes Education Handout Provided: Yes Muscles Treated: bil thoracic paraspinals Electrical Stimulation Performed: No Treatment Response/Outcome: twitch responses noted on Lt; decreased pain and tightness following     09/30/2024  Therex:    HEP instruction/performance c cues for techniques, handout provided.   Trial set performed of each for comprehension and symptom assessment.  See below for exercise list.   Discussed importance of mobility gains to help improve stiffness in joint and muscle movement in back.  Discussed benefits of walking, bike as able based off symptom presentation.      PATIENT EDUCATION:  09/30/2024 Education details: HEP, POC Person educated: Patient Education method: Programmer, Multimedia, Demonstration, Verbal cues, and Handouts Education comprehension: verbalized understanding, returned demonstration, and verbal cues required  HOME EXERCISE PROGRAM: Access Code: [email or web address] URL: [email or web address] Date: 09/30/2024 Prepared by: Ozell Silvan  Exercises - Standing Lumbar Extension  - 2-4 x daily - 7 x weekly - 1 sets - 5 reps - Shoulder External Rotation and Scapular Retraction  - 3-5 x daily - 7 x weekly - 1 sets - 5 reps - 2 hold - Supine Lower Trunk Rotation  - 2-3 x daily - 7 x weekly - 1 sets - 3-5 reps - 15 hold - Standing Bilateral Low Shoulder Row with Anchored Resistance  - 1-2 x daily - 7 x weekly - 1-2 sets - 10-15 reps - Shoulder Extension with Resistance  - 1-2 x daily - 7 x weekly - 1-2 sets - 10-15 reps  ASSESSMENT:  CLINICAL IMPRESSION: Pt with recent increase in pain since last visit; did have positive response today to session. Continue skilled PT.   OBJECTIVE IMPAIRMENTS: decreased activity tolerance, decreased coordination, decreased endurance, decreased mobility, difficulty walking, decreased ROM, hypomobility, increased fascial restrictions, impaired perceived functional ability, increased muscle spasms, impaired flexibility, improper body mechanics, postural dysfunction, and pain.   ACTIVITY LIMITATIONS: carrying, lifting, bending, sitting, standing, sleeping, transfers, bed mobility, reach over head, and locomotion level  PARTICIPATION LIMITATIONS: meal prep, cleaning, laundry, interpersonal relationship, community activity, and yard  work  PERSONAL FACTORS: Anxiety, COPD, depression, Diabetic neuropathy are also affecting patient's functional outcome.   REHAB POTENTIAL: Good  CLINICAL DECISION MAKING: Stable/uncomplicated  EVALUATION COMPLEXITY: Low   GOALS: Goals reviewed with patient? Yes  SHORT TERM GOALS: (  target date for Short term goals are 3 weeks 10/21/2024)  1. Patient will demonstrate independent use of home exercise program to maintain progress from in clinic treatments.  Goal status: New  LONG TERM GOALS: (target dates for all long term goals are 10 weeks  12/09/2024 )   1. Patient will demonstrate/report pain at worst less than or equal to 2/10 to facilitate minimal limitation in daily activity secondary to pain symptoms.  Goal status: New   2. Patient will demonstrate independent use of home exercise program to facilitate ability to maintain/progress functional gains from skilled physical therapy services.  Goal status: New   3. Patient will demonstrate Patient specific functional scale avg > or = 8/10 to indicate reduced disability due to condition.   Goal status: New   4. Patient will demonstrate lumbar extension 100 % WFL s symptoms to facilitate upright standing, walking posture at PLOF s limitation.  Goal status: New   5.  Patient will demonstrate thoracic ROM WFL s symptoms to facilitate daily mobility at [facility].   Goal status: New     PLAN:  PT FREQUENCY: 1-2x/week  PT DURATION: 10 weeks  PLANNED INTERVENTIONS: Can include 02853- PT Re-evaluation, 97110-Therapeutic exercises, 97530- Therapeutic activity, [id number]- Neuromuscular re-education, 97535- Self Care, 97140- Manual therapy, [phone]- Gait training, [phone]- Orthotic Fit/training, [phone]- Canalith repositioning, [id number]- Aquatic Therapy, [phone]- Electrical stimulation (unattended), [id number] Physical performance testing, 97016- Vasopneumatic device, [id number]- Ultrasound, [id number]- Traction (mechanical), [id number]- Ionotophoresis 4mg /ml Dexamethasone ,   [id number] - Needle insertion w/o injection 1 or 2 muscles, 20561 - Needle insertion w/o injection 3 or more muscles.    Patient/Family education, Balance training, Stair training, Taping, Dry Needling, Joint mobilization, Joint manipulation, Spinal manipulation, Spinal mobilization, Scar mobilization, Vestibular training, Visual/preceptual remediation/compensation, DME instructions, Cryotherapy, and Moist heat.  All performed as medically necessary.  All included unless contraindicated  PLAN FOR NEXT SESSION: Check STGs, assess response to DN and continue PRN, thoracic mobility and postural strengthening   Corean JULIANNA Ku, PT, DPT 10/15/24 10:54 AM      "

## 2024-10-16 ENCOUNTER — Ambulatory Visit (HOSPITAL_COMMUNITY)

## 2024-10-20 ENCOUNTER — Encounter: Admitting: Physical Therapy

## 2024-10-22 ENCOUNTER — Encounter: Admitting: Rehabilitative and Restorative Service Providers"

## 2024-10-22 NOTE — Therapy (Incomplete)
 " OUTPATIENT PHYSICAL THERAPY TREATMENT   Patient Name: Jared Griffin MRN: [id number] DOB:Aug 12, 1961, 64 y.o., male Today's Date: 10/22/2024  END OF SESSION:      Past Medical History:  Diagnosis Date   Anxiety    COPD (chronic obstructive pulmonary disease) (HCC)    Coronary atherosclerosis of native coronary artery    a. 12/09/2013: s/p PCI with 3.0 x 28 Promus DES to mLAD   Depression    Diabetic neuropathy (HCC)    Elbow injury 07/25/2017   Surgery for torn tendon   Essential hypertension    Fatty liver disease, nonalcoholic    GERD (gastroesophageal reflux disease)    Iron deficiency anemia 08/15/2021   Lateral epicondylitis of right elbow    Mixed hyperlipidemia    Obesity    Osgood-Schlatter's disease of right knee    Skin cyst 10/03/2017   Removed from back   Type 2 diabetes mellitus (HCC)    Past Surgical History:  Procedure Laterality Date   BIOPSY  01/21/2019   Procedure: BIOPSY;  Surgeon: Golda Claudis PENNER, MD;  Location: [facility];  Service: Endoscopy;;  gastric bx and gastric polyps   BIOPSY  11/08/2020   Procedure: BIOPSY;  Surgeon: Eartha Angelia Sieving, MD;  Location: [facility];  Service: Gastroenterology;;   CARDIAC CATHETERIZATION  2011   CARDIAC CATHETERIZATION N/A 09/20/2015   Procedure: Left Heart Cath and Coronary Angiography;  Surgeon: Lonni JONETTA Cash, MD;  Location: [facility] INVASIVE CV LAB;  Service: Cardiovascular;  Laterality: N/A;   CHOLECYSTECTOMY  2003   COLONOSCOPY  06/19/2012   Procedure: COLONOSCOPY;  Surgeon: Claudis PENNER Golda, MD;  Location: [facility];  Service: Endoscopy;  Laterality: N/A;  930   COLONOSCOPY N/A 10/17/2017   Procedure: COLONOSCOPY;  Surgeon: Golda Claudis PENNER, MD;  [facility]: [facility];  Service: Endoscopy;  Laterality: N/A;  1225   COLONOSCOPY WITH PROPOFOL  N/A 11/08/2020   Procedure: COLONOSCOPY WITH PROPOFOL ;  Surgeon: Eartha Angelia Sieving, MD;  [facility]: [facility];  Service: Gastroenterology;   Laterality: N/A;  10:45   COLONOSCOPY WITH PROPOFOL  N/A 04/04/2021   Procedure: COLONOSCOPY WITH PROPOFOL ;  Surgeon: Eartha Angelia Sieving, MD;  [facility]: [facility];  Service: Gastroenterology;  Laterality: N/A;   CORONARY PRESSURE/FFR STUDY N/A 04/10/2022   Procedure: INTRAVASCULAR PRESSURE WIRE/FFR STUDY;  Surgeon: Claudene Victory ORN, MD;  Location: [facility];  Service: Cardiovascular;  Laterality: N/A;   CYST EXCISION  07/2019   ESOPHAGOGASTRODUODENOSCOPY (EGD) WITH PROPOFOL  N/A 01/21/2019   Procedure: ESOPHAGOGASTRODUODENOSCOPY (EGD) WITH PROPOFOL ;  Surgeon: Golda Claudis PENNER, MD;  [facility]: [facility];  Service: Endoscopy;  Laterality: N/A;  10:15am   ESOPHAGOGASTRODUODENOSCOPY (EGD) WITH PROPOFOL  N/A 02/17/2021   Procedure: ESOPHAGOGASTRODUODENOSCOPY (EGD) WITH PROPOFOL ;  Surgeon: Eartha Angelia Sieving, MD;  [facility]: [facility];  Service: Gastroenterology;  Laterality: N/A;  10:20   ESOPHAGOGASTRODUODENOSCOPY (EGD) WITH PROPOFOL  N/A 04/04/2021   Procedure: ESOPHAGOGASTRODUODENOSCOPY (EGD) WITH PROPOFOL ;  Surgeon: Eartha Angelia Sieving, MD;  [facility]: [facility];  Service: Gastroenterology;  Laterality: N/A;  12:30   GIVENS CAPSULE STUDY N/A 09/13/2021   Procedure: GIVENS CAPSULE STUDY;  Surgeon: Eartha Angelia Sieving, MD;  [facility]: [facility];  Service: Gastroenterology;  Laterality: N/A;  7:30   KNEE ARTHROPLASTY Right 1999   LEFT HEART CATH AND CORONARY ANGIOGRAPHY N/A 02/26/2018   Procedure: LEFT HEART CATH AND CORONARY ANGIOGRAPHY;  Surgeon: Dann Candyce RAMAN, MD;  Location: [facility] INVASIVE CV LAB;  Service: Cardiovascular;  Laterality: N/A;  LEFT HEART CATH AND CORONARY ANGIOGRAPHY N/A 04/10/2022   Procedure: LEFT HEART CATH AND CORONARY ANGIOGRAPHY;  Surgeon: Claudene Victory ORN, MD;  Location: [facility];  Service: Cardiovascular;  Laterality: N/A;   LEFT HEART CATHETERIZATION WITH CORONARY ANGIOGRAM N/A 12/09/2013   Procedure: LEFT HEART  CATHETERIZATION WITH CORONARY ANGIOGRAM;  Surgeon: Candyce GORMAN Reek, MD;  Location: [facility] C[facility];  Service: Cardiovascular;  Laterality: N/A;   Liver biopsy     POLYPECTOMY  10/17/2017   Procedure: POLYPECTOMY;  Surgeon: Golda Claudis PENNER, MD;  [facility]: [facility];  Service: Endoscopy;;  ascending colon, splenic flexure,sigmoid x2   POLYPECTOMY  11/08/2020   Procedure: POLYPECTOMY;  Surgeon: Eartha Angelia Sieving, MD;  [facility]: [facility];  Service: Gastroenterology;;   POLYPECTOMY  04/04/2021   Procedure: POLYPECTOMY;  Surgeon: Eartha Angelia Sieving, MD;  [facility];  Service: Gastroenterology;;   TENNIS ELBOW RELEASE/NIRSCHEL PROCEDURE Right 07/25/2017   Procedure: TENNIS ELBOW RELEASE and debriedment;  Surgeon: Margrette Taft BRAVO, MD;  Location: [facility];  Service: Orthopedics;  Laterality: Right;   TONSILLECTOMY  1970's   Patient Active Problem List   Diagnosis Date Noted   Chronic obstructive pulmonary disease, unspecified COPD type (HCC) 06/19/2023   Aortic atherosclerosis 01/17/2023   Dysphagia 12/17/2022   Moderate single current episode of major depressive disorder (HCC) 09/26/2022   Coronary artery disease involving native coronary artery of native heart with angina pectoris 09/26/2022   Atypical chest pain 03/06/2022   Diarrhea 12/14/2021   Iron deficiency anemia 08/15/2021   Fever 03/15/2021   Lactate blood increase 03/15/2021   Sepsis (HCC) 03/15/2021   Tachycardia 03/15/2021   Orthostatic hypotension 12/07/2020   IBS (irritable bowel syndrome) 11/17/2020   Chronic diarrhea 08/17/2020   NASH (nonalcoholic steatohepatitis) 08/17/2020   Elevated LFTs 08/17/2020   Tremor of both hands 05/06/2020   Bilateral occipital neuralgia 05/06/2020   Abdominal pain, epigastric 01/13/2019   Anxiety 01/04/2019   Radicular pain of left lower extremity 12/22/2018   Moderate persistent asthma 10/25/2018   Dizziness 07/12/2018   Nonintractable headache     Abnormal nuclear stress test    Aftercare following surgery 07/25/17 08/13/2017   Hx of colonic polyps 08/07/2017   Lateral epicondylitis of right elbow    Cervical nerve root impingement 12/09/2015   Generalized anxiety disorder 09/27/2015   Pain in the chest    Depression 01/24/2015   Esophageal reflux 01/24/2015   Preoperative cardiovascular examination 06/28/2014   DM type 2 causing vascular disease (HCC)    Mixed hyperlipidemia    Obesity    Intermediate coronary syndrome (HCC) 12/09/2013   Lumbago 05/21/2012   Essential hypertension, benign 12/28/2009   CAD S/P LAD DES March 2015 12/28/2009   Hyperlipidemia LDL goal <70 11/25/2009   Accelerating angina (HCC) 11/25/2009   DM2 (diabetes mellitus, type 2) (HCC) 11/24/2009   ABDOMINAL PAIN, HX OF 11/24/2009    PCP: Alphonsa Glendia LABOR, MD  REFERRING PROVIDER: Trudy Duwaine BRAVO, NP  REFERRING DIAG: Diagnosis M54.6,G89.29 (ICD-10-CM) - Chronic right-sided thoracic back pain M54.50,G89.29 (ICD-10-CM) - Chronic right-sided low back pain without sciatica M79.18 (ICD-10-CM) - Myofascial pain syndrome  Rationale for Evaluation and Treatment: Rehabilitation  THERAPY DIAG:  No diagnosis found.  ONSET DATE: 08/2024 onset  SUBJECTIVE:  SUBJECTIVE STATEMENT: *** Pain is about the same but has moved up a little higher. Transitional movements and rolling in bed have been uncomfortable.  PERTINENT HISTORY:  Anxiety, COPD, depression, Diabetic neuropathy  PAIN: *** NPRS scale: at worst 10+/10 Pain location: mid and lower back, Rt > Lt.  Pain description: pressure in back Aggravating factors: prolonged positioning, moving, lifting,carrying Relieving factors: heating  PRECAUTIONS: None  WEIGHT BEARING RESTRICTIONS: No  FALLS:  Has patient fallen  in last 6 months? No  LIVING ENVIRONMENT: Lives in: House/apartment Stairs: Stairs to enter, 12 on side, 8 on front.  With rails.   OCCUPATION: Retired  PLOF: Independent, yard work, armed forces training and education officer.   PATIENT GOALS: Reduce pain, wants to reduce weight  OBJECTIVE:   DIAGNOSTIC FINDINGS:  See chart in epic.  MRI in 2024 most recent testing.   IMPRESSION: 1. Progressive leftward disc protrusion at L4-5 with mild left subarticular and foraminal stenosis. 2. New far left lateral disc protrusion at L2-3 without focal stenosis. 3. Far left annular tear at L1-2 is new since the prior study. No compressive stenosis is present.  PATIENT SURVEYS:  Patient-Specific Activity Scoring Scheme  0 represents unable to perform. 10 represents able to perform at prior level. 0 1 2 3 4 5 6 7 8 9  10 (Date and Score)   Activity Eval  09/30/2024    1. Bending over   2    2. Picking up items  0    3. Getting up/down  3   4. Walking  3   5.    Score 2 avg    Total score = sum of the activity scores/number of activities Minimum detectable change (90%CI) for average score = 2 points Minimum detectable change (90%CI) for single activity score = 3 points  SCREENING FOR RED FLAGS: 09/30/2024 Bowel or bladder incontinence: No Cauda equina syndrome: No  COGNITION: 09/30/2024 Overall cognitive status: WFL normal      SENSATION: 09/30/2024 WFL  MUSCLE LENGTH: 09/30/2024 No measurements  POSTURE:  09/30/2024 Reduced lumbar lordosis in standing.   PALPATION: 09/30/2024 Muscle tightness and guarding  ROM:  09/30/2024 Directional Preference Assessment: Centralization:not observed  Peripheralization: not observed   AROM Eval 09/30/2024  Lumbar Flexion To mid shin with quick pain, gowers sign upon return. Painful arc  Lumbar Extension 50% WFL  X 5 repeated in standing , improved to 100 %  Lumbar Lt lateral flexion To femoral epicondyle with hinge at mid thoracic (minimal  movement lower)  c pain  Lumbar  Rt lateral flexion To femoral epicondyle with hinge at mid thoracic (minimal movement lower)  c pain  Thoracic Right rotation 40 c pain  Thoracic Left rotation 35 c pain  Thoracic extension < 25 % with pain  Thoracic flexion  < 25 % with pain   (Blank rows = not tested)  LOWER EXTREMITY ROM:     09/30/2024: No hip ROM impact on back symptoms.    Right Eval 09/30/2024 Left Eval 09/30/2024  Hip flexion    Hip extension    Hip abduction    Hip adduction    Hip internal rotation    Hip external rotation    Knee flexion    Knee extension    Ankle dorsiflexion    Ankle plantarflexion    Ankle inversion    Ankle eversion     (Blank rows = not tested)  LOWER EXTREMITY MMT:    MMT Right Eval 09/30/2024 Left Eval 09/30/2024  Hip flexion 5/5 5/5  Hip extension    Hip abduction    Hip adduction    Hip internal rotation    Hip external rotation    Knee flexion 5/5 5/5  Knee extension 5/5 5/5  Ankle dorsiflexion 5/5 5/5  Ankle plantarflexion    Ankle inversion    Ankle eversion     (Blank rows = not tested)  SPECIAL TESTS:  09/30/2024 (-) slump  FUNCTIONAL TESTS:  09/30/2024 18 inch chair with hands on knees with pain noted, increased difficulty   GAIT: 09/30/2024 Independent ambulation                                                                                                                                                                                                                   TODAY'S TREATMENT *** 10/23/24  TREATMENT 10/15/24 Manual STM with compression to bil mid thoracic paraspinals; skilled palpation and monitoring of soft tissue during dry needling IASTM with percussive device to Lt Mid thoracic paraspinals  Trigger Point Dry Needling  Subsequent Treatment: Instructions provided previously at initial dry needling treatment.   Patient Verbal Consent Given: Yes Education Handout Provided: Yes Muscles Treated:  Lt thoracic paraspinals, Lt lats Electrical Stimulation Performed: No Treatment Response/Outcome: twitch responses noted on Lt; decreased pain and tightness following   10/08/24 Seated forward flexion with stool to mid and Rt (for Lt side stretch) 5 x 10 sec each Verbal review of HEP and encouraged pt to continue with current HEP - verbalized no concerns or questions    Manual STM with compression to bil mid thoracic paraspinals; skilled palpation and monitoring of soft tissue during dry needling  Trigger Point Dry Needling  Initial Treatment: Pt instructed on Dry Needling rational, procedures, and possible side effects. Pt instructed to expect mild to moderate muscle soreness later in the day and/or into the next day.  Pt instructed in methods to reduce muscle soreness. Pt instructed to continue prescribed HEP. Because Dry Needling was performed over or adjacent to a lung field, pt was educated on S/S of pneumothorax and to seek immediate medical attention should they occur.  Patient was educated on signs and symptoms of infection and other risk factors and advised to seek medical attention should they occur.  Patient verbalized understanding of these instructions and education.   Patient Verbal Consent Given: Yes Education Handout Provided: Yes Muscles Treated: bil thoracic paraspinals Electrical Stimulation Performed: No Treatment Response/Outcome: twitch responses noted on Lt; decreased pain and tightness following     09/30/2024  Therex:    HEP instruction/performance c cues for techniques, handout provided.  Trial set performed of each for comprehension and symptom assessment.  See below for exercise list.   Discussed importance of mobility gains to help improve stiffness in joint and muscle movement in back.  Discussed benefits of walking, bike as able based off symptom presentation.      PATIENT EDUCATION:  09/30/2024 Education details: HEP, POC Person educated:  Patient Education method: Programmer, Multimedia, Demonstration, Verbal cues, and Handouts Education comprehension: verbalized understanding, returned demonstration, and verbal cues required  HOME EXERCISE PROGRAM: Access Code: [email or web address] URL: [email or web address] Date: 09/30/2024 Prepared by: Ozell Silvan  Exercises - Standing Lumbar Extension  - 2-4 x daily - 7 x weekly - 1 sets - 5 reps - Shoulder External Rotation and Scapular Retraction  - 3-5 x daily - 7 x weekly - 1 sets - 5 reps - 2 hold - Supine Lower Trunk Rotation  - 2-3 x daily - 7 x weekly - 1 sets - 3-5 reps - 15 hold - Standing Bilateral Low Shoulder Row with Anchored Resistance  - 1-2 x daily - 7 x weekly - 1-2 sets - 10-15 reps - Shoulder Extension with Resistance  - 1-2 x daily - 7 x weekly - 1-2 sets - 10-15 reps  ASSESSMENT:  CLINICAL IMPRESSION: *** Pt with recent increase in pain since last visit; did have positive response today to session. Continue skilled PT.   OBJECTIVE IMPAIRMENTS: decreased activity tolerance, decreased coordination, decreased endurance, decreased mobility, difficulty walking, decreased ROM, hypomobility, increased fascial restrictions, impaired perceived functional ability, increased muscle spasms, impaired flexibility, improper body mechanics, postural dysfunction, and pain.   ACTIVITY LIMITATIONS: carrying, lifting, bending, sitting, standing, sleeping, transfers, bed mobility, reach over head, and locomotion level  PARTICIPATION LIMITATIONS: meal prep, cleaning, laundry, interpersonal relationship, community activity, and yard work  PERSONAL FACTORS: Anxiety, COPD, depression, Diabetic neuropathy are also affecting patient's functional outcome.   REHAB POTENTIAL: Good  CLINICAL DECISION MAKING: Stable/uncomplicated  EVALUATION COMPLEXITY: Low   GOALS: *** Goals reviewed with patient? Yes  SHORT TERM GOALS: (target date for Short term goals are 3 weeks 10/21/2024)  1.  Patient will demonstrate independent use of home exercise program to maintain progress from in clinic treatments.  Goal status: New  LONG TERM GOALS: (target dates for all long term goals are 10 weeks  12/09/2024 )   1. Patient will demonstrate/report pain at worst less than or equal to 2/10 to facilitate minimal limitation in daily activity secondary to pain symptoms.  Goal status: New   2. Patient will demonstrate independent use of home exercise program to facilitate ability to maintain/progress functional gains from skilled physical therapy services.  Goal status: New   3. Patient will demonstrate Patient specific functional scale avg > or = 8/10 to indicate reduced disability due to condition.   Goal status: New   4. Patient will demonstrate lumbar extension 100 % WFL s symptoms to facilitate upright standing, walking posture at PLOF s limitation.  Goal status: New   5.  Patient will demonstrate thoracic ROM WFL s symptoms to facilitate daily mobility at [facility].   Goal status: New     PLAN:  PT FREQUENCY: 1-2x/week  PT DURATION: 10 weeks  PLANNED INTERVENTIONS: Can include 02853- PT Re-evaluation, 97110-Therapeutic exercises, 97530- Therapeutic activity, [id number]- Neuromuscular re-education, 97535- Self Care, 97140- Manual therapy, [id number]- Gait training, [phone]- Orthotic Fit/training, [phone]- Canalith repositioning, [id number]- Aquatic Therapy, [id number]- Electrical stimulation (unattended), [id number] Physical performance testing, 97016-  Vasopneumatic device, [id number]- Ultrasound, 02987- Traction (mechanical), [id number]- Ionotophoresis 4mg /ml Dexamethasone ,  [id number] - Needle insertion w/o injection 1 or 2 muscles, 20561 - Needle insertion w/o injection 3 or more muscles.    Patient/Family education, Balance training, Stair training, Taping, Dry Needling, Joint mobilization, Joint manipulation, Spinal manipulation, Spinal mobilization, Scar mobilization, Vestibular training, Visual/preceptual  remediation/compensation, DME instructions, Cryotherapy, and Moist heat.  All performed as medically necessary.  All included unless contraindicated  PLAN FOR NEXT SESSION: Check STGs, assess response to DN and continue PRN, thoracic mobility and postural strengthening  SIGN**     "

## 2024-10-23 ENCOUNTER — Encounter: Admitting: Physical Therapy

## 2024-10-27 ENCOUNTER — Encounter: Admitting: Rehabilitative and Restorative Service Providers"

## 2024-10-29 ENCOUNTER — Encounter: Admitting: Rehabilitative and Restorative Service Providers"

## 2024-11-03 ENCOUNTER — Encounter: Admitting: Rehabilitative and Restorative Service Providers"

## 2024-11-05 ENCOUNTER — Encounter: Admitting: Rehabilitative and Restorative Service Providers"

## 2024-11-06 ENCOUNTER — Telehealth (HOSPITAL_COMMUNITY): Admitting: Psychiatry
